# Patient Record
Sex: Male | Born: 1945
Health system: Southern US, Community
[De-identification: ages and names within clinical notes are randomized; demographics above are authoritative.]

## PROBLEM LIST (undated history)

## (undated) ENCOUNTER — Emergency Department (HOSPITAL_BASED_OUTPATIENT_CLINIC_OR_DEPARTMENT_OTHER): Admission: EM | Payer: Medicare Other | Source: Home / Self Care

## (undated) DIAGNOSIS — C61 Malignant neoplasm of prostate: Secondary | ICD-10-CM

## (undated) DIAGNOSIS — Z8679 Personal history of other diseases of the circulatory system: Secondary | ICD-10-CM

## (undated) DIAGNOSIS — I493 Ventricular premature depolarization: Secondary | ICD-10-CM

## (undated) DIAGNOSIS — Z5189 Encounter for other specified aftercare: Secondary | ICD-10-CM

## (undated) DIAGNOSIS — T7840XA Allergy, unspecified, initial encounter: Secondary | ICD-10-CM

## (undated) DIAGNOSIS — F32A Depression, unspecified: Secondary | ICD-10-CM

## (undated) DIAGNOSIS — R945 Abnormal results of liver function studies: Secondary | ICD-10-CM

## (undated) DIAGNOSIS — I4891 Unspecified atrial fibrillation: Secondary | ICD-10-CM

## (undated) DIAGNOSIS — K5792 Diverticulitis of intestine, part unspecified, without perforation or abscess without bleeding: Secondary | ICD-10-CM

## (undated) DIAGNOSIS — Z803 Family history of malignant neoplasm of breast: Secondary | ICD-10-CM

## (undated) DIAGNOSIS — Z800 Family history of malignant neoplasm of digestive organs: Secondary | ICD-10-CM

## (undated) DIAGNOSIS — M549 Dorsalgia, unspecified: Secondary | ICD-10-CM

## (undated) DIAGNOSIS — K219 Gastro-esophageal reflux disease without esophagitis: Secondary | ICD-10-CM

## (undated) DIAGNOSIS — H52209 Unspecified astigmatism, unspecified eye: Secondary | ICD-10-CM

## (undated) DIAGNOSIS — N289 Disorder of kidney and ureter, unspecified: Secondary | ICD-10-CM

## (undated) DIAGNOSIS — K5790 Diverticulosis of intestine, part unspecified, without perforation or abscess without bleeding: Secondary | ICD-10-CM

## (undated) DIAGNOSIS — E559 Vitamin D deficiency, unspecified: Secondary | ICD-10-CM

## (undated) DIAGNOSIS — Z0000 Encounter for general adult medical examination without abnormal findings: Secondary | ICD-10-CM

## (undated) DIAGNOSIS — E785 Hyperlipidemia, unspecified: Secondary | ICD-10-CM

## (undated) DIAGNOSIS — R252 Cramp and spasm: Secondary | ICD-10-CM

## (undated) DIAGNOSIS — Z87442 Personal history of urinary calculi: Secondary | ICD-10-CM

## (undated) DIAGNOSIS — K760 Fatty (change of) liver, not elsewhere classified: Secondary | ICD-10-CM

## (undated) DIAGNOSIS — I4510 Unspecified right bundle-branch block: Secondary | ICD-10-CM

## (undated) DIAGNOSIS — R5381 Other malaise: Secondary | ICD-10-CM

## (undated) DIAGNOSIS — I10 Essential (primary) hypertension: Secondary | ICD-10-CM

## (undated) DIAGNOSIS — R5383 Other fatigue: Principal | ICD-10-CM

## (undated) DIAGNOSIS — H9193 Unspecified hearing loss, bilateral: Secondary | ICD-10-CM

## (undated) DIAGNOSIS — R001 Bradycardia, unspecified: Secondary | ICD-10-CM

## (undated) DIAGNOSIS — F329 Major depressive disorder, single episode, unspecified: Secondary | ICD-10-CM

## (undated) DIAGNOSIS — M545 Low back pain: Secondary | ICD-10-CM

## (undated) DIAGNOSIS — I517 Cardiomegaly: Secondary | ICD-10-CM

## (undated) DIAGNOSIS — K648 Other hemorrhoids: Secondary | ICD-10-CM

## (undated) DIAGNOSIS — K589 Irritable bowel syndrome without diarrhea: Secondary | ICD-10-CM

## (undated) DIAGNOSIS — M419 Scoliosis, unspecified: Secondary | ICD-10-CM

## (undated) DIAGNOSIS — M1990 Unspecified osteoarthritis, unspecified site: Secondary | ICD-10-CM

## (undated) DIAGNOSIS — R739 Hyperglycemia, unspecified: Secondary | ICD-10-CM

## (undated) DIAGNOSIS — M170 Bilateral primary osteoarthritis of knee: Secondary | ICD-10-CM

## (undated) DIAGNOSIS — H269 Unspecified cataract: Secondary | ICD-10-CM

## (undated) DIAGNOSIS — R351 Nocturia: Secondary | ICD-10-CM

## (undated) DIAGNOSIS — N489 Disorder of penis, unspecified: Secondary | ICD-10-CM

## (undated) DIAGNOSIS — D3501 Benign neoplasm of right adrenal gland: Secondary | ICD-10-CM

## (undated) DIAGNOSIS — Z87438 Personal history of other diseases of male genital organs: Secondary | ICD-10-CM

## (undated) DIAGNOSIS — I444 Left anterior fascicular block: Secondary | ICD-10-CM

## (undated) DIAGNOSIS — C4491 Basal cell carcinoma of skin, unspecified: Secondary | ICD-10-CM

## (undated) DIAGNOSIS — F419 Anxiety disorder, unspecified: Secondary | ICD-10-CM

## (undated) DIAGNOSIS — D1720 Benign lipomatous neoplasm of skin and subcutaneous tissue of unspecified limb: Secondary | ICD-10-CM

## (undated) DIAGNOSIS — K769 Liver disease, unspecified: Secondary | ICD-10-CM

## (undated) DIAGNOSIS — E669 Obesity, unspecified: Secondary | ICD-10-CM

## (undated) HISTORY — DX: Gastro-esophageal reflux disease without esophagitis: K21.9

## (undated) HISTORY — DX: Family history of malignant neoplasm of breast: Z80.3

## (undated) HISTORY — PX: CATARACT EXTRACTION, BILATERAL: SHX1313

## (undated) HISTORY — DX: Other hemorrhoids: K64.8

## (undated) HISTORY — DX: Obesity, unspecified: E66.9

## (undated) HISTORY — DX: Irritable bowel syndrome without diarrhea: K58.9

## (undated) HISTORY — DX: Hyperglycemia, unspecified: R73.9

## (undated) HISTORY — DX: Other fatigue: R53.83

## (undated) HISTORY — PX: COLONOSCOPY: SHX174

## (undated) HISTORY — DX: Other malaise: R53.81

## (undated) HISTORY — DX: Family history of malignant neoplasm of digestive organs: Z80.0

## (undated) HISTORY — PX: OTHER SURGICAL HISTORY: SHX169

## (undated) HISTORY — DX: Basal cell carcinoma of skin, unspecified: C44.91

## (undated) HISTORY — DX: Diverticulosis of intestine, part unspecified, without perforation or abscess without bleeding: K57.90

## (undated) HISTORY — DX: Unspecified hearing loss, bilateral: H91.93

## (undated) HISTORY — DX: Bilateral primary osteoarthritis of knee: M17.0

## (undated) HISTORY — DX: Allergy, unspecified, initial encounter: T78.40XA

## (undated) HISTORY — DX: Encounter for other specified aftercare: Z51.89

## (undated) HISTORY — DX: Unspecified cataract: H26.9

## (undated) HISTORY — PX: NOSE SURGERY: SHX723

## (undated) HISTORY — PX: SKIN BIOPSY: SHX1

## (undated) HISTORY — DX: Left anterior fascicular block: I44.4

## (undated) HISTORY — DX: Encounter for general adult medical examination without abnormal findings: Z00.00

## (undated) HISTORY — PX: COLONOSCOPY WITH PROPOFOL: SHX5780

## (undated) HISTORY — DX: Abnormal results of liver function studies: R94.5

## (undated) HISTORY — DX: Low back pain: M54.5

---
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
## 1999-11-22 ENCOUNTER — Emergency Department (HOSPITAL_COMMUNITY): Admission: EM | Admit: 1999-11-22 | Discharge: 1999-11-22 | Payer: Self-pay | Admitting: Emergency Medicine

## 2002-07-11 ENCOUNTER — Encounter: Payer: Self-pay | Admitting: Pulmonary Disease

## 2002-07-11 ENCOUNTER — Ambulatory Visit (HOSPITAL_COMMUNITY): Admission: RE | Admit: 2002-07-11 | Discharge: 2002-07-11 | Payer: Self-pay | Admitting: Pulmonary Disease

## 2002-07-11 DIAGNOSIS — D3501 Benign neoplasm of right adrenal gland: Secondary | ICD-10-CM

## 2002-07-11 HISTORY — DX: Benign neoplasm of right adrenal gland: D35.01

## 2002-08-03 ENCOUNTER — Encounter: Payer: Self-pay | Admitting: Internal Medicine

## 2004-06-26 ENCOUNTER — Ambulatory Visit: Payer: Self-pay | Admitting: Pulmonary Disease

## 2004-10-15 ENCOUNTER — Ambulatory Visit: Payer: Self-pay | Admitting: Pulmonary Disease

## 2004-10-16 ENCOUNTER — Ambulatory Visit (HOSPITAL_COMMUNITY): Admission: RE | Admit: 2004-10-16 | Discharge: 2004-10-16 | Payer: Self-pay | Admitting: Pulmonary Disease

## 2005-02-20 ENCOUNTER — Ambulatory Visit: Payer: Self-pay | Admitting: Pulmonary Disease

## 2005-08-07 ENCOUNTER — Ambulatory Visit: Payer: Self-pay | Admitting: Pulmonary Disease

## 2005-08-11 ENCOUNTER — Ambulatory Visit: Payer: Self-pay | Admitting: Cardiology

## 2006-03-10 ENCOUNTER — Ambulatory Visit: Payer: Self-pay | Admitting: Pulmonary Disease

## 2006-03-10 LAB — CONVERTED CEMR LAB
BUN: 15 mg/dL (ref 6–23)
Bilirubin Urine: NEGATIVE
CO2: 31 meq/L (ref 19–32)
Calcium: 9.7 mg/dL (ref 8.4–10.5)
Chloride: 104 meq/L (ref 96–112)
Creatinine, Ser: 1.3 mg/dL (ref 0.4–1.5)
GFR calc Af Amer: 72 mL/min
GFR calc non Af Amer: 60 mL/min
Glucose, Bld: 105 mg/dL — ABNORMAL HIGH (ref 70–99)
Hemoglobin, Urine: NEGATIVE
Ketones, ur: NEGATIVE mg/dL
Leukocytes, UA: NEGATIVE
Nitrite: NEGATIVE
PSA: 1.01 ng/mL (ref 0.10–4.00)
Potassium: 4 meq/L (ref 3.5–5.1)
Sodium: 141 meq/L (ref 135–145)
Specific Gravity, Urine: 1.015 (ref 1.000–1.03)
Total Protein, Urine: NEGATIVE mg/dL
Urine Glucose: NEGATIVE mg/dL
Urobilinogen, UA: 0.2 (ref 0.0–1.0)
pH: 7 (ref 5.0–8.0)

## 2006-03-29 ENCOUNTER — Ambulatory Visit: Payer: Self-pay | Admitting: Pulmonary Disease

## 2006-03-29 LAB — CONVERTED CEMR LAB
ALT: 36 units/L (ref 0–40)
AST: 29 units/L (ref 0–37)
Albumin: 4.2 g/dL (ref 3.5–5.2)
Alkaline Phosphatase: 64 units/L (ref 39–117)
BUN: 12 mg/dL (ref 6–23)
Basophils Absolute: 0 10*3/uL (ref 0.0–0.1)
Basophils Relative: 0.8 % (ref 0.0–1.0)
Bilirubin, Direct: 0.1 mg/dL (ref 0.0–0.3)
CO2: 31 meq/L (ref 19–32)
Calcium: 10.4 mg/dL (ref 8.4–10.5)
Chloride: 105 meq/L (ref 96–112)
Cholesterol: 224 mg/dL (ref 0–200)
Creatinine, Ser: 1.4 mg/dL (ref 0.4–1.5)
Direct LDL: 168.5 mg/dL
Eosinophils Absolute: 0.2 10*3/uL (ref 0.0–0.6)
Eosinophils Relative: 3.6 % (ref 0.0–5.0)
GFR calc Af Amer: 66 mL/min
GFR calc non Af Amer: 55 mL/min
Glucose, Bld: 102 mg/dL — ABNORMAL HIGH (ref 70–99)
HCT: 44.8 % (ref 39.0–52.0)
HDL: 35.3 mg/dL — ABNORMAL LOW (ref >39.0)
Hemoglobin: 15.4 g/dL (ref 13.0–17.0)
Lymphocytes Relative: 25.5 % (ref 12.0–46.0)
MCHC: 34.3 g/dL (ref 30.0–36.0)
MCV: 94.2 fL (ref 78.0–100.0)
Monocytes Absolute: 0.5 10*3/uL (ref 0.2–0.7)
Monocytes Relative: 8.5 % (ref 3.0–11.0)
Neutro Abs: 3.3 10*3/uL (ref 1.4–7.7)
Neutrophils Relative %: 61.6 % (ref 43.0–77.0)
Platelets: 206 10*3/uL (ref 150–400)
Potassium: 4.4 meq/L (ref 3.5–5.1)
RBC: 4.75 M/uL (ref 4.22–5.81)
RDW: 12.9 % (ref 11.5–14.6)
Sodium: 143 meq/L (ref 135–145)
TSH: 2.84 microintl units/mL (ref 0.35–5.50)
Total Bilirubin: 0.7 mg/dL (ref 0.3–1.2)
Total CHOL/HDL Ratio: 6.3
Total Protein: 6.8 g/dL (ref 6.0–8.3)
Triglycerides: 109 mg/dL (ref 0–149)
VLDL: 22 mg/dL (ref 0–40)
WBC: 5.4 10*3/uL (ref 4.5–10.5)

## 2006-08-11 ENCOUNTER — Ambulatory Visit: Payer: Self-pay | Admitting: Pulmonary Disease

## 2006-08-11 LAB — CONVERTED CEMR LAB
ALT: 31 units/L (ref 0–53)
AST: 25 units/L (ref 0–37)
Albumin: 4.1 g/dL (ref 3.5–5.2)
Alkaline Phosphatase: 62 units/L (ref 39–117)
BUN: 10 mg/dL (ref 6–23)
Basophils Absolute: 0 10*3/uL (ref 0.0–0.1)
Basophils Relative: 0.3 % (ref 0.0–1.0)
Bilirubin, Direct: 0.2 mg/dL (ref 0.0–0.3)
CO2: 30 meq/L (ref 19–32)
Calcium: 9.7 mg/dL (ref 8.4–10.5)
Chloride: 104 meq/L (ref 96–112)
Creatinine, Ser: 1.4 mg/dL (ref 0.4–1.5)
Eosinophils Absolute: 0.2 10*3/uL (ref 0.0–0.6)
Eosinophils Relative: 1.8 % (ref 0.0–5.0)
GFR calc Af Amer: 66 mL/min
GFR calc non Af Amer: 55 mL/min
Glucose, Bld: 96 mg/dL (ref 70–99)
HCT: 42.2 % (ref 39.0–52.0)
Hemoglobin: 14.7 g/dL (ref 13.0–17.0)
Lymphocytes Relative: 14.2 % (ref 12.0–46.0)
MCHC: 34.9 g/dL (ref 30.0–36.0)
MCV: 93.4 fL (ref 78.0–100.0)
Monocytes Absolute: 0.9 10*3/uL — ABNORMAL HIGH (ref 0.2–0.7)
Monocytes Relative: 8.3 % (ref 3.0–11.0)
Neutro Abs: 8.3 10*3/uL — ABNORMAL HIGH (ref 1.4–7.7)
Neutrophils Relative %: 75.4 % (ref 43.0–77.0)
Platelets: 191 10*3/uL (ref 150–400)
Potassium: 4.3 meq/L (ref 3.5–5.1)
RBC: 4.52 M/uL (ref 4.22–5.81)
RDW: 13.5 % (ref 11.5–14.6)
Sed Rate: 8 mm/hr (ref 0–20)
Sodium: 141 meq/L (ref 135–145)
Total Bilirubin: 1.4 mg/dL — ABNORMAL HIGH (ref 0.3–1.2)
Total Protein: 7 g/dL (ref 6.0–8.3)
WBC: 11 10*3/uL — ABNORMAL HIGH (ref 4.5–10.5)

## 2006-08-12 ENCOUNTER — Ambulatory Visit: Payer: Self-pay | Admitting: Cardiology

## 2007-01-13 DIAGNOSIS — K5792 Diverticulitis of intestine, part unspecified, without perforation or abscess without bleeding: Secondary | ICD-10-CM

## 2007-01-13 HISTORY — DX: Diverticulitis of intestine, part unspecified, without perforation or abscess without bleeding: K57.92

## 2007-01-13 HISTORY — PX: COLON SURGERY: SHX602

## 2007-04-12 ENCOUNTER — Ambulatory Visit: Payer: Self-pay | Admitting: Pulmonary Disease

## 2007-04-12 DIAGNOSIS — F411 Generalized anxiety disorder: Secondary | ICD-10-CM | POA: Insufficient documentation

## 2007-04-12 DIAGNOSIS — I10 Essential (primary) hypertension: Secondary | ICD-10-CM | POA: Insufficient documentation

## 2007-04-12 DIAGNOSIS — K219 Gastro-esophageal reflux disease without esophagitis: Secondary | ICD-10-CM | POA: Insufficient documentation

## 2007-07-22 ENCOUNTER — Encounter (INDEPENDENT_AMBULATORY_CARE_PROVIDER_SITE_OTHER): Payer: Self-pay | Admitting: *Deleted

## 2007-07-22 ENCOUNTER — Ambulatory Visit: Payer: Self-pay | Admitting: Pulmonary Disease

## 2007-07-22 LAB — CONVERTED CEMR LAB
ALT: 44 units/L (ref 0–53)
AST: 26 units/L (ref 0–37)
Albumin: 4.3 g/dL (ref 3.5–5.2)
Alkaline Phosphatase: 70 units/L (ref 39–117)
BUN: 12 mg/dL (ref 6–23)
Basophils Absolute: 0 10*3/uL (ref 0.0–0.1)
Basophils Relative: 0.1 % (ref 0.0–1.0)
Bilirubin, Direct: 0.2 mg/dL (ref 0.0–0.3)
CO2: 29 meq/L (ref 19–32)
Calcium: 9.9 mg/dL (ref 8.4–10.5)
Chloride: 102 meq/L (ref 96–112)
Creatinine, Ser: 1.4 mg/dL (ref 0.4–1.5)
Eosinophils Absolute: 0.1 10*3/uL (ref 0.0–0.7)
Eosinophils Relative: 1.3 % (ref 0.0–5.0)
GFR calc Af Amer: 66 mL/min
GFR calc non Af Amer: 55 mL/min
Glucose, Bld: 104 mg/dL — ABNORMAL HIGH (ref 70–99)
HCT: 46 % (ref 39.0–52.0)
Hemoglobin: 16 g/dL (ref 13.0–17.0)
Lymphocytes Relative: 13.7 % (ref 12.0–46.0)
MCHC: 34.7 g/dL (ref 30.0–36.0)
MCV: 94.5 fL (ref 78.0–100.0)
Monocytes Absolute: 0.7 10*3/uL (ref 0.1–1.0)
Monocytes Relative: 6.5 % (ref 3.0–12.0)
Neutro Abs: 8.2 10*3/uL — ABNORMAL HIGH (ref 1.4–7.7)
Neutrophils Relative %: 78.4 % — ABNORMAL HIGH (ref 43.0–77.0)
Platelets: 196 10*3/uL (ref 150–400)
Potassium: 4.4 meq/L (ref 3.5–5.1)
RBC: 4.87 M/uL (ref 4.22–5.81)
RDW: 13.4 % (ref 11.5–14.6)
Sed Rate: 4 mm/hr (ref 0–16)
Sodium: 139 meq/L (ref 135–145)
Total Bilirubin: 1.5 mg/dL — ABNORMAL HIGH (ref 0.3–1.2)
Total Protein: 7.4 g/dL (ref 6.0–8.3)
WBC: 10.4 10*3/uL (ref 4.5–10.5)

## 2007-07-25 ENCOUNTER — Telehealth: Payer: Self-pay | Admitting: Adult Health

## 2007-07-29 ENCOUNTER — Ambulatory Visit: Payer: Self-pay | Admitting: Internal Medicine

## 2007-07-29 LAB — CONVERTED CEMR LAB: Streptococcus, Group A Screen (Direct): NEGATIVE

## 2007-07-31 ENCOUNTER — Telehealth: Payer: Self-pay | Admitting: Pulmonary Disease

## 2007-08-01 ENCOUNTER — Encounter: Payer: Self-pay | Admitting: Pulmonary Disease

## 2007-08-01 ENCOUNTER — Telehealth: Payer: Self-pay | Admitting: Internal Medicine

## 2007-08-01 ENCOUNTER — Ambulatory Visit: Payer: Self-pay | Admitting: Internal Medicine

## 2007-08-01 ENCOUNTER — Telehealth: Payer: Self-pay | Admitting: Adult Health

## 2007-08-01 ENCOUNTER — Ambulatory Visit: Payer: Self-pay | Admitting: Cardiovascular Disease

## 2007-08-01 LAB — CONVERTED CEMR LAB
Bacteria, UA: NEGATIVE
Bilirubin Urine: NEGATIVE
Crystals: NEGATIVE
Ketones, ur: NEGATIVE mg/dL
Leukocytes, UA: NEGATIVE
Nitrite: NEGATIVE
Specific Gravity, Urine: 1.025 (ref 1.000–1.03)
Total Protein, Urine: NEGATIVE mg/dL
Urine Glucose: NEGATIVE mg/dL
Urobilinogen, UA: 0.2 (ref 0.0–1.0)
pH: 5.5 (ref 5.0–8.0)

## 2007-08-03 ENCOUNTER — Ambulatory Visit: Payer: Self-pay | Admitting: Gastroenterology

## 2007-08-12 ENCOUNTER — Ambulatory Visit: Payer: Self-pay | Admitting: Internal Medicine

## 2007-08-15 ENCOUNTER — Telehealth: Payer: Self-pay | Admitting: Adult Health

## 2007-08-19 ENCOUNTER — Encounter: Payer: Self-pay | Admitting: Pulmonary Disease

## 2007-09-09 ENCOUNTER — Ambulatory Visit: Payer: Self-pay | Admitting: Internal Medicine

## 2007-09-21 ENCOUNTER — Ambulatory Visit: Payer: Self-pay | Admitting: Pulmonary Disease

## 2007-09-22 ENCOUNTER — Encounter: Payer: Self-pay | Admitting: Pulmonary Disease

## 2007-09-22 ENCOUNTER — Encounter: Payer: Self-pay | Admitting: Internal Medicine

## 2007-09-22 DIAGNOSIS — K760 Fatty (change of) liver, not elsewhere classified: Secondary | ICD-10-CM | POA: Insufficient documentation

## 2007-09-22 DIAGNOSIS — M1990 Unspecified osteoarthritis, unspecified site: Secondary | ICD-10-CM | POA: Insufficient documentation

## 2007-09-22 DIAGNOSIS — M47817 Spondylosis without myelopathy or radiculopathy, lumbosacral region: Secondary | ICD-10-CM | POA: Insufficient documentation

## 2007-09-22 DIAGNOSIS — D3500 Benign neoplasm of unspecified adrenal gland: Secondary | ICD-10-CM | POA: Insufficient documentation

## 2007-11-04 ENCOUNTER — Inpatient Hospital Stay (HOSPITAL_COMMUNITY): Admission: RE | Admit: 2007-11-04 | Discharge: 2007-11-08 | Payer: Self-pay | Admitting: Surgery

## 2007-11-04 ENCOUNTER — Encounter (INDEPENDENT_AMBULATORY_CARE_PROVIDER_SITE_OTHER): Payer: Self-pay | Admitting: Surgery

## 2007-11-08 ENCOUNTER — Encounter: Payer: Self-pay | Admitting: Internal Medicine

## 2007-11-18 ENCOUNTER — Telehealth (INDEPENDENT_AMBULATORY_CARE_PROVIDER_SITE_OTHER): Payer: Self-pay | Admitting: *Deleted

## 2007-11-24 ENCOUNTER — Telehealth (INDEPENDENT_AMBULATORY_CARE_PROVIDER_SITE_OTHER): Payer: Self-pay | Admitting: *Deleted

## 2007-11-25 ENCOUNTER — Telehealth (INDEPENDENT_AMBULATORY_CARE_PROVIDER_SITE_OTHER): Payer: Self-pay | Admitting: *Deleted

## 2007-11-25 ENCOUNTER — Ambulatory Visit: Payer: Self-pay | Admitting: Pulmonary Disease

## 2007-11-25 DIAGNOSIS — E669 Obesity, unspecified: Secondary | ICD-10-CM | POA: Insufficient documentation

## 2007-11-25 HISTORY — DX: Obesity, unspecified: E66.9

## 2007-11-27 LAB — CONVERTED CEMR LAB
Bilirubin Urine: NEGATIVE
Crystals: NEGATIVE
Hemoglobin, Urine: NEGATIVE
Ketones, ur: NEGATIVE mg/dL
Leukocytes, UA: NEGATIVE
Nitrite: NEGATIVE
Specific Gravity, Urine: 1.025 (ref 1.000–1.03)
Total Protein, Urine: NEGATIVE mg/dL
Urine Glucose: NEGATIVE mg/dL
Urobilinogen, UA: 0.2 (ref 0.0–1.0)
pH: 6 (ref 5.0–8.0)

## 2008-03-26 ENCOUNTER — Ambulatory Visit: Payer: Self-pay | Admitting: Pulmonary Disease

## 2008-03-31 LAB — CONVERTED CEMR LAB
ALT: 33 units/L (ref 0–53)
AST: 26 units/L (ref 0–37)
Albumin: 4 g/dL (ref 3.5–5.2)
Alkaline Phosphatase: 65 units/L (ref 39–117)
BUN: 19 mg/dL (ref 6–23)
Basophils Absolute: 0.1 10*3/uL (ref 0.0–0.1)
Basophils Relative: 0.8 % (ref 0.0–3.0)
Bilirubin, Direct: 0.1 mg/dL (ref 0.0–0.3)
CO2: 29 meq/L (ref 19–32)
Calcium: 9.4 mg/dL (ref 8.4–10.5)
Chloride: 110 meq/L (ref 96–112)
Cholesterol: 176 mg/dL (ref 0–200)
Creatinine, Ser: 1.4 mg/dL (ref 0.4–1.5)
Eosinophils Absolute: 0.3 10*3/uL (ref 0.0–0.7)
Eosinophils Relative: 3.8 % (ref 0.0–5.0)
GFR calc Af Amer: 66 mL/min
GFR calc non Af Amer: 55 mL/min
Glucose, Bld: 104 mg/dL — ABNORMAL HIGH (ref 70–99)
HCT: 44 % (ref 39.0–52.0)
HDL: 35.6 mg/dL — ABNORMAL LOW (ref >39.0)
Hemoglobin: 15.1 g/dL (ref 13.0–17.0)
LDL Cholesterol: 123 mg/dL — ABNORMAL HIGH (ref 0–99)
Lymphocytes Relative: 27.2 % (ref 12.0–46.0)
MCHC: 34.3 g/dL (ref 30.0–36.0)
MCV: 94.9 fL (ref 78.0–100.0)
Monocytes Absolute: 0.5 10*3/uL (ref 0.1–1.0)
Monocytes Relative: 7.8 % (ref 3.0–12.0)
Neutro Abs: 4 10*3/uL (ref 1.4–7.7)
Neutrophils Relative %: 60.4 % (ref 43.0–77.0)
PSA: 1.24 ng/mL (ref 0.10–4.00)
Platelets: 205 10*3/uL (ref 150–400)
Potassium: 4.2 meq/L (ref 3.5–5.1)
RBC: 4.64 M/uL (ref 4.22–5.81)
RDW: 13.5 % (ref 11.5–14.6)
Sodium: 146 meq/L — ABNORMAL HIGH (ref 135–145)
TSH: 3.15 microintl units/mL (ref 0.35–5.50)
Total Bilirubin: 0.9 mg/dL (ref 0.3–1.2)
Total CHOL/HDL Ratio: 4.9
Total Protein: 6.8 g/dL (ref 6.0–8.3)
Triglycerides: 87 mg/dL (ref 0–149)
VLDL: 17 mg/dL (ref 0–40)
WBC: 6.7 10*3/uL (ref 4.5–10.5)

## 2009-02-01 ENCOUNTER — Telehealth: Payer: Self-pay | Admitting: Pulmonary Disease

## 2009-02-04 ENCOUNTER — Ambulatory Visit: Payer: Self-pay | Admitting: Pulmonary Disease

## 2009-02-06 ENCOUNTER — Ambulatory Visit: Payer: Self-pay | Admitting: Pulmonary Disease

## 2009-02-07 LAB — CONVERTED CEMR LAB
ALT: 30 units/L (ref 0–53)
AST: 27 units/L (ref 0–37)
Albumin: 4 g/dL (ref 3.5–5.2)
Alkaline Phosphatase: 61 units/L (ref 39–117)
BUN: 13 mg/dL (ref 6–23)
Basophils Absolute: 0 10*3/uL (ref 0.0–0.1)
Basophils Relative: 0.7 % (ref 0.0–3.0)
Bilirubin Urine: NEGATIVE
Bilirubin, Direct: 0.2 mg/dL (ref 0.0–0.3)
CO2: 28 meq/L (ref 19–32)
Calcium: 9.2 mg/dL (ref 8.4–10.5)
Chloride: 107 meq/L (ref 96–112)
Cholesterol: 139 mg/dL (ref 0–200)
Creatinine, Ser: 1.5 mg/dL (ref 0.4–1.5)
Eosinophils Absolute: 0.3 10*3/uL (ref 0.0–0.7)
Eosinophils Relative: 4.3 % (ref 0.0–5.0)
GFR calc non Af Amer: 50.12 mL/min (ref >60)
Glucose, Bld: 95 mg/dL (ref 70–99)
HCT: 44.1 % (ref 39.0–52.0)
HDL: 36.4 mg/dL — ABNORMAL LOW (ref >39.00)
Hemoglobin, Urine: NEGATIVE
Hemoglobin: 14.7 g/dL (ref 13.0–17.0)
Ketones, ur: NEGATIVE mg/dL
LDL Cholesterol: 80 mg/dL (ref 0–99)
Leukocytes, UA: NEGATIVE
Lymphocytes Relative: 28.6 % (ref 12.0–46.0)
Lymphs Abs: 1.8 10*3/uL (ref 0.7–4.0)
MCHC: 33.3 g/dL (ref 30.0–36.0)
MCV: 96 fL (ref 78.0–100.0)
Monocytes Absolute: 0.6 10*3/uL (ref 0.1–1.0)
Monocytes Relative: 8.9 % (ref 3.0–12.0)
Neutro Abs: 3.6 10*3/uL (ref 1.4–7.7)
Neutrophils Relative %: 57.5 % (ref 43.0–77.0)
Nitrite: NEGATIVE
PSA: 1.42 ng/mL (ref 0.10–4.00)
Platelets: 189 10*3/uL (ref 150.0–400.0)
Potassium: 3.9 meq/L (ref 3.5–5.1)
RBC: 4.59 M/uL (ref 4.22–5.81)
RDW: 13.2 % (ref 11.5–14.6)
Sodium: 143 meq/L (ref 135–145)
Specific Gravity, Urine: >=1.03 (ref 1.000–1.030)
TSH: 5.33 microintl units/mL (ref 0.35–5.50)
Total Bilirubin: 1 mg/dL (ref 0.3–1.2)
Total CHOL/HDL Ratio: 4
Total Protein, Urine: NEGATIVE mg/dL
Total Protein: 6.6 g/dL (ref 6.0–8.3)
Triglycerides: 114 mg/dL (ref 0.0–149.0)
Urine Glucose: NEGATIVE mg/dL
Urobilinogen, UA: 0.2 (ref 0.0–1.0)
VLDL: 22.8 mg/dL (ref 0.0–40.0)
WBC: 6.3 10*3/uL (ref 4.5–10.5)
pH: 5 (ref 5.0–8.0)

## 2009-03-08 ENCOUNTER — Telehealth (INDEPENDENT_AMBULATORY_CARE_PROVIDER_SITE_OTHER): Payer: Self-pay | Admitting: *Deleted

## 2009-05-24 ENCOUNTER — Ambulatory Visit: Payer: Self-pay | Admitting: Cardiology

## 2009-05-24 LAB — CONVERTED CEMR LAB
BUN: 14 mg/dL (ref 6–23)
CO2: 30 meq/L (ref 19–32)
Calcium: 10 mg/dL (ref 8.4–10.5)
Chloride: 105 meq/L (ref 96–112)
Creatinine, Ser: 1.3 mg/dL (ref 0.4–1.5)
GFR calc non Af Amer: 57.53 mL/min (ref >60)
Glucose, Bld: 90 mg/dL (ref 70–99)
Potassium: 4.5 meq/L (ref 3.5–5.1)
Sodium: 141 meq/L (ref 135–145)

## 2009-05-30 ENCOUNTER — Telehealth (INDEPENDENT_AMBULATORY_CARE_PROVIDER_SITE_OTHER): Payer: Self-pay | Admitting: *Deleted

## 2009-06-03 ENCOUNTER — Ambulatory Visit: Payer: Self-pay | Admitting: Cardiology

## 2009-06-03 ENCOUNTER — Ambulatory Visit: Payer: Self-pay

## 2009-06-03 ENCOUNTER — Encounter (HOSPITAL_COMMUNITY): Admission: RE | Admit: 2009-06-03 | Discharge: 2009-08-14 | Payer: Self-pay | Admitting: Cardiology

## 2009-06-03 HISTORY — PX: OTHER SURGICAL HISTORY: SHX169

## 2009-07-29 ENCOUNTER — Telehealth (INDEPENDENT_AMBULATORY_CARE_PROVIDER_SITE_OTHER): Payer: Self-pay | Admitting: *Deleted

## 2010-02-06 ENCOUNTER — Ambulatory Visit: Admit: 2010-02-06 | Payer: Self-pay | Admitting: Pulmonary Disease

## 2010-02-11 NOTE — Assessment & Plan Note (Signed)
Summary: 6 months/apc   Primary Care Provider:  Alroy Dust, MD  CC:  65month ROV & review of mult medical problems....  History of Present Illness: 65 y/o WM here for a follow up visit... he has multiple medical problems as noted below...    ~  he  had several bouts of acute diverticulitis in 2009 and seen by DrGessner w/ colonoscopy showing severe sigmoid divertics w/ redundant colon- referred to DrTsuei for consideration of sigmoid colectomy... this was done Oct09 (lap assisted sigmoid colectomy) and fully recovered now...  ~  treated in Nov09 for ?NGU w/ Doxy Bid... UA & culture were neg...  ~  Mar10:  feeling better overall now and recovered from his abd surg...   ~  February 06, 2009:  here for check-up & review of recent fasting labs... c/o some LBP w/ radiation to the right leg... known lumbar spondylitic disease but never saw Ortho in past>> we discussed Ortho referral for further eval (XRays, MRI, etc) & Rx w/ Parafon Forte, Mobic, Heat, etc...  weight is up to 264# and we discussed diet + exercise & back exercises as well...    Current Problem List:  HYPERTENSION (ICD-401.9) - on ASA 325mg /d, & ATENOLOL 50mg /d (he prefers 1/2 Bid) w/ good control of BP... BP= 122/82, takes med regularly and tolerates well... he denies HA, fatigue, visual changes, CP, palipit, dizziness, syncope, dyspnea, edema, etc...  ~  CXR 10/09 pre-op was clear & WNL...  HYPERLIPIDEMIA (ICD-272.4) - on SIMVASTATIN 80mg /d (he prefers 1/2 Bid)..  ~  FLP 3/08 on diet showed TChol 224, TG 109, HDL 35, LDL 169... rec to start Crestor 20mg /d...  ~  FLP 3/10 on Cres20 showed TChol 176, TG 87, HDL 36, LDL 123... changed to Simva80- $$.  ~  FLP 1/11 on Simva80 showed TChol 139, TG 114, HDL 36, LDL 80  GERD (ICD-530.81) - uses OTC Prilosec or Pepcid as needed... no prev EGD or imaging studies done...  DIVERTICULOSIS OF COLON (ICD-562.10) - ** see above ** hx severe diverticulosis w/ several  bouts of diverticulitis  req antibiotic Rx... subseq lap-assited sigmoid colectomy 10/09 by DrTsuei... he uses STOOL SOFTENERS Prn & ANUSOL HC Prn...  ~  CT Abd 7/09 showed active diverticulitis in LLQ- no free air, perf, or abscess... mild basilar atx, 2.6cm right adrenal adenoma (unchanged), AA atherosclerotic changes, sm renal cyst...  ~  colonoscopy 09/09/07 by Rodena Medin w/ severe sigmoid divertics & redundant colon... no polyps.  ~  Oct09: DrTsuei performed a lap-assisted sigmoid colectomy... & improved after that.  FAMILY HX COLON CANCER (ICD-V16.0) - parent dx w/ colon cancer age 65...  Hx of FATTY LIVER DISEASE (ICD-571.8) - Abd Sonar 10/06 showed norm GB, diffuse incr echogenicity of liver c/w fatty infiltration...  ~  normal LFTs 1/11 w/ SGOT= 27, SGPT= 30...  BENIGN NEOPLASM OF ADRENAL GLAND (ICD-227.0) - 2.6cm right adrenal adenoma without change seen on CT scans back to 2004 & no change on the scan in 2009...  DEGENERATIVE JOINT DISEASE (ICD-715.90) & SPONDYLOSIS, LUMBAR (ICD-721.3) - CT Abd showed lumar spondylosis as an incidental finding- bilat L5 pars defects w/ gr 1 anterolithesis of L5 on S1... prev denied back pain- stoic & refused Ortho eval...  ~  1/11: presents w/ LBP w/ radiation to right leg- advised Ortho eval for XRays & poss MRI... he will decide; Rx MOBIC 7.5mg /d & PARAFON FORTE Tid Prn...  ANXIETY (ICD-300.00) - he uses LORAZEPAM 1mg  Prn...    Allergies: 1)  !  Ceclor  Comments:  Nurse/Medical Assistant: The patient's medications and allergies were reviewed with the patient and were updated in the Medication and Allergy Lists.  Past History:  Past Medical History:  HYPERTENSION (ICD-401.9) HYPERLIPIDEMIA (ICD-272.4) OVERWEIGHT (ICD-278.02) GERD (ICD-530.81) DIVERTICULOSIS OF COLON (ICD-562.10) FAMILY HX COLON CANCER (ICD-V16.0) Hx of FATTY LIVER DISEASE (ICD-571.8) BENIGN NEOPLASM OF ADRENAL GLAND (ICD-227.0) DEGENERATIVE JOINT DISEASE (ICD-715.90) SPONDYLOSIS, LUMBAR  (ICD-721.3) ANXIETY (ICD-300.00)  Past Surgical History: Nasal surgery S/P laparoscopic assisted sigmoid colectomy 10/09 by DrTsuei  Family History: Reviewed history from 09/21/2007 and no changes required. Mother had breast cancer & colon cancer Family History of Heart Disease: sister & brothers Diverticulitis, siblings, one has had successful segmental resection  Social History: Reviewed history from 09/21/2007 and no changes required. Married Patient is a former smoker. quit in 1971 Alcohol Use - 2 drinks daily  Review of Systems      See HPI       The patient complains of difficulty walking.  The patient denies anorexia, fever, weight loss, weight gain, vision loss, decreased hearing, hoarseness, chest pain, syncope, dyspnea on exertion, peripheral edema, prolonged cough, headaches, hemoptysis, abdominal pain, melena, hematochezia, severe indigestion/heartburn, hematuria, incontinence, muscle weakness, suspicious skin lesions, transient blindness, depression, unusual weight change, abnormal bleeding, enlarged lymph nodes, and angioedema.         His CC is LBP & some puritis ani...   Vital Signs:  Patient profile:   65 year old male Height:      73 inches Weight:      263.50 pounds BMI:     34.89 O2 Sat:      95 % on Room air Temp:     97.2 degrees F oral Pulse rate:   75 / minute BP sitting:   122 / 82  (left arm) Cuff size:   regular  Vitals Entered By: [name] CMA (February 06, 2009 11:43 AM)  O2 Sat at Rest %:  95 O2 Flow:  Room air CC: 63month ROV & review of mult medical problems... Comments meds updated today   Physical Exam  Additional Exam:  WD, Overweight, 65 y/o WM in NAD... GENERAL:  Alert & oriented; pleasant & cooperative... HEENT:  [place]/AT, EOM-wnl, PERRLA, EACs-clear, TMs-wnl, NOSE-clear, THROAT-clear & wnl. NECK:  Supple w/ fairROM; no JVD; normal carotid impulses w/o bruits; no thyromegaly or nodules palpated; no lymphadenopathy. CHEST:   Clear to P & A; without wheezes/ rales/ or rhonchi heard... HEART:  Regular Rhythm; without murmurs/ rubs/ or gallops detected... ABDOMEN:  Soft & nontender; normal bowel sounds; no organomegaly or masses palpated... EXT: without deformities, mild arthritic changes; no varicose veins/ +venous insuffic/ no edema. BACK:  SLR is equivocal at 60degrees... DTRs symmetric... NEURO:  CN's intact; no focal neuro deficits... DERM:  No lesions noted; no rash etc...     MISC. Report  Procedure date:  02/04/2009  Findings:      Lipid Panel (LIPID)   Cholesterol               139 mg/dL                   1-308   Triglycerides             114.0 mg/dL                 6.5-784.6   HDL                  [L]  96.29 mg/dL                 >  39.00    LDL Cholesterol           80 mg/dL                    1-61  BMP (METABOL)   Sodium                    143 mEq/L                   135-145   Potassium                 3.9 mEq/L                   3.5-5.1   Chloride                  107 mEq/L                   96-112   Carbon Dioxide            28 mEq/L                    19-32   Glucose                   95 mg/dL                    09-60   BUN                       13 mg/dL                    4-54   Creatinine                1.5 mg/dL                   0.9-8.1   Calcium                   9.2 mg/dL                   1.9-14.7   GFR                       50.12 mL/min                >60  CBC Platelet w/Diff (CBCD)   White Cell Count          6.3 K/uL                    4.5-10.5   Red Cell Count            4.59 Mil/uL                 4.22-5.81   Hemoglobin                14.7 g/dL                   82.9-56.2   Hematocrit                44.1 %                      39.0-52.0   MCV                       96.0 fl  78.0-100.0   Platelet Count            189.0 K/uL                  150.0-400.0   Neutrophil %              57.5 %                      43.0-77.0   Lymphocyte %              28.6 %                       12.0-46.0   Monocyte %                8.9 %                       3.0-12.0   Eosinophils%              4.3 %                       0.0-5.0   Basophils %               0.7 %      Comments:      Hepatic/Liver Function Panel (HEPATIC)   Total Bilirubin           1.0 mg/dL                   9.5-6.2   Direct Bilirubin          0.2 mg/dL                   1.3-0.8   Alkaline Phosphatase      61 U/L                      39-117   AST                       27 U/L                      0-37   ALT                       30 U/L                      0-53   Total Protein             6.6 g/dL                    6.5-7.8   Albumin                   4.0 g/dL                    4.6-9.6   TSH (TSH)   FastTSH                   5.33 uIU/mL                 0.35-5.50  UDip Only (UDIP)   Color                     YELLOW   Clarity  CLEAR                       Clear   Specific Gravity          >=1.030                     1.000 - 1.030   Urine Ph                  5.0                         5.0-8.0   Protein                   NEGATIVE                    Negative   Urine Glucose             NEGATIVE                    Negative   Ketones                   NEGATIVE                    Negative   Urine Bilirubin           NEGATIVE                    Negative   Blood                     NEGATIVE                    Negative   Urobilinogen              0.2                         0.0 - 1.0   Leukocyte Esterace        NEGATIVE                    Negative   Nitrite                   NEGATIVE                    Negative   Prostate Specific Antigen (PSA)   PSA-Hyb                   1.42 ng/mL                  0.10-4.00   Impression & Recommendations:  Problem # 1:  HYPERTENSION (ICD-401.9) Controlled-  same med. His updated medication list for this problem includes:    Atenolol 50 Mg Tabs (Atenolol) .Marland Kitchen... Take 1 tablet by mouth once a day  Problem # 2:  HYPERLIPIDEMIA  (ICD-272.4) Doing satis on the Simva80 + diet Rx... His updated medication list for this problem includes:    Simvastatin 80 Mg Tabs (Simvastatin) .Marland Kitchen... Take 1 tab by mouth at bedtime...  Problem # 3:  GERD (ICD-530.81) Continue OMEP... His updated medication list for this problem includes:    Omeprazole 20 Mg Cpdr (Omeprazole) .Marland Kitchen... Take one tablet by mouth 30 mins prior to the first meal of the day  Problem # 4:  DIVERTICULOSIS OF COLON (ICD-562.10) Stable-  no exac, up to date on colon, etc... advised Metamucil etc...  Problem # 5:  DEGENERATIVE JOINT DISEASE (ICD-715.90) He has DJD, LBP, known Spondylitic dis... we discussed Rx w/ rest, heat, Parafon Forte + Mobic... needs Ortho eval w/ XRays, MRI, etc & we will refer when he agrees to consultation... His updated medication list for this problem includes:    Bayer Aspirin 325 Mg Tabs (Aspirin) .Marland Kitchen... Take 1 tablet by mouth once a day    Meloxicam 7.5 Mg Tabs (Meloxicam) .Marland Kitchen... Take 1 tab by mouth once daily as needed for arthritis pain (take w/ a meal)  Problem # 6:  OTHER MEDICAL PROBLEMS AS NOTED>>>  Complete Medication List: 1)  Bayer Aspirin 325 Mg Tabs (Aspirin) .... Take 1 tablet by mouth once a day 2)  Atenolol 50 Mg Tabs (Atenolol) .... Take 1 tablet by mouth once a day 3)  Simvastatin 80 Mg Tabs (Simvastatin) .... Take 1 tab by mouth at bedtime.Marland KitchenMarland Kitchen 4)  Omeprazole 20 Mg Cpdr (Omeprazole) .... Take one tablet by mouth 30 mins prior to the first meal of the day 5)  Dulcolax Stool Softener 100 Mg Caps (Docusate sodium) .... As needed 6)  Lorazepam 1 Mg Tabs (Lorazepam) .... Take 1 tablet by mouth three times a day as needed for nerves.Marland KitchenMarland Kitchen 7)  Chlorzoxazone 500 Mg Tabs (Chlorzoxazone) .... Take 1 tab by mouth three times a day as needed for muscle spasm... 8)  Meloxicam 7.5 Mg Tabs (Meloxicam) .... Take 1 tab by mouth once daily as needed for arthritis pain (take w/ a meal) 9)  Anusol-hc 2.5 % Crea (Hydrocortisone) .... Apply as  directed...  Other Orders: Prescription Created Electronically [id number])  Patient Instructions: 1)  Today we updated your med list- see below.... 2)  We wrote new perscriptions for generic Parafon to take up to 3 times daily for muscle spasm,  and generic Mobic to try once daily w/ food for arthritis pain.Marland KitchenMarland Kitchen 3)  If your back/ leg pain gets worse- call me for an Ortho referral... 4)  Merlyn Albert, You need to get on a diet + exercise program and get your weight down... 5)  Call for any questions... Prescriptions: OMEPRAZOLE 20 MG CPDR (OMEPRAZOLE) take one tablet by mouth 30 mins prior to the first meal of the day  #90 x 6   Entered by:   [name] CMA   Authorized by:   Michele Mcalpine MD   Signed by:   [name] CMA on 02/06/2009   Method used:   Print then Give to Patient   RxID:   [id number] LORAZEPAM 1 MG  TABS (LORAZEPAM) Take 1 tablet by mouth three times a day as needed for nerves...  #90 x 4   Entered by:   [name] CMA   Authorized by:   Michele Mcalpine MD   Signed by:   [name] CMA on 02/06/2009   Method used:   Print then Give to Patient   RxID:   [id number] ATENOLOL 50 MG  TABS (ATENOLOL) Take 1 tablet by mouth once a day  #90 x prn   Entered by:   [name] CMA   Authorized by:   Michele Mcalpine MD   Signed by:   [name] CMA on 02/06/2009   Method used:   Print then Give to Patient   RxID:   [id number] ANUSOL-HC 2.5 % CREA (HYDROCORTISONE) apply as directed...  #1 tube... x prn  Entered and Authorized by:   Michele Mcalpine MD   Signed by:   Michele Mcalpine MD on 02/06/2009   Method used:   Print then Give to Patient   RxID:   [id number] MELOXICAM 7.5 MG TABS (MELOXICAM) take 1 tab by mouth once daily as needed for arthritis pain (take w/ a meal)  #30 x 5   Entered and Authorized by:   Michele Mcalpine MD   Signed by:   Michele Mcalpine MD on 02/06/2009   Method used:   Print then Give to Patient   RxID:   [id number] CHLORZOXAZONE 500 MG  TABS (CHLORZOXAZONE) take 1 tab by mouth three times a day as needed for muscle spasm...  #90 x 5   Entered and Authorized by:   Michele Mcalpine MD   Signed by:   Michele Mcalpine MD on 02/06/2009   Method used:   Print then Give to Patient   RxID:   [id number]

## 2010-02-11 NOTE — Progress Notes (Signed)
Summary: Records request from [facility]  Request for records received from [facility]. Request forwarded to [employer]. Wilder Glade  March 08, 2009 12:02 PM

## 2010-02-11 NOTE — Progress Notes (Signed)
Summary: order for labs  Phone Note Call from Patient   Caller: Patient Call For: Keryn Nessler Summary of Call: need order put in for cpx on web Initial call taken by: Rickard Patience,  February 01, 2009 4:44 PM  Follow-up for Phone Call        Pt has cpx sched for 1/26 and wants to have have done fasting on mon 1/24. Please advise thanks David [name]  February 01, 2009 4:51 PM    labs are in computer for pt on monday---called and spoke with pt and he is aware. [name] CMA  February 01, 2009 5:06 PM

## 2010-02-11 NOTE — Progress Notes (Signed)
Summary: pt cancelled  [facility] - pt cancelled appt and said he will call back to reschedule  ---- Converted from flag ---- ---- 05/28/2009 9:22 AM, Merita Norton Lloyd-Fate wrote: sent request to sleep center [facility]  May 28, 2009 9:22 A ------------------------------

## 2010-02-11 NOTE — Miscellaneous (Signed)
Summary: Orders Update  Clinical Lists Changes  Orders: Added new Referral order of Nuclear Stress Test (Nuc Stress Test) - Signed 

## 2010-02-11 NOTE — Assessment & Plan Note (Signed)
Summary: np6/ chestpain / pt has uhc/ gd  Medications Added ATENOLOL 100 MG TABS (ATENOLOL) 1 by mouth daily PEPCID AC 10 MG TABS (FAMOTIDINE) 1 by mouth daily NYQUIL 60-7.06-10-998 MG/30ML LIQD (PSEUDOEPH-DOXYLAMINE-DM-APAP) as needed HYDROCHLOROTHIAZIDE 25 MG TABS (HYDROCHLOROTHIAZIDE) 1 p odaily HYDROCHLOROTHIAZIDE 25 MG TABS (HYDROCHLOROTHIAZIDE) 1/2 by mouth daily      Allergies Added:    Visit Type:  Initial Consult Primary Provider:  Dr. Levander Campion  CC:  HTN and Chest Pain.  History of Present Illness: The patient presents for evaluation of hypertension which has recently been more difficult to control. In addition he has a remarkable family history for early onset heart disease. He himself has had some chest discomfort. He describes this discomfort as happening for approximately 6 months. It happens at rest. He thinks it may be somewhat positional. It is an upper heaviness. There is no radiation to the jaw or to the arms. There is no associated nausea vomiting or diaphoresis. He doesn't notice any palpitations, presyncope or syncope. He does not describe PND or orthopnea. He did have a stress perfusion study several years ago and thinks this was normal. He was hospitalized at that time with chest pain.  The patient has had hypertension for many years. He reports that recently it has been slightly more difficult to control. He had increased his dose of atenolol and hydrochlorothiazide was started. He said that with this his blood pressure has been better controlled. Of note he does snore. His wife witnesses apneic episodes and he has daytime somnolence.  Current Medications (verified): 1)  Bayer Aspirin 325 Mg  Tabs (Aspirin) .... Take 1 Tablet By Mouth Once A Day 2)  Atenolol 100 Mg Tabs (Atenolol) .Marland Kitchen.. 1 By Mouth Daily 3)  Simvastatin 80 Mg Tabs (Simvastatin) .... Take 1 Tab By Mouth At Bedtime.Marland KitchenMarland Kitchen 4)  Pepcid Ac 10 Mg Tabs (Famotidine) .Marland Kitchen.. 1 By Mouth Daily 5)  Dulcolax  Stool Softener 100 Mg  Caps (Docusate Sodium) .... As Needed 6)  Lorazepam 1 Mg  Tabs (Lorazepam) .... Take 1 Tablet By Mouth Three Times A Day As Needed For Nerves.Marland KitchenMarland Kitchen 7)  Nyquil 60-7.06-10-998 Mg/66ml Liqd (Pseudoeph-Doxylamine-Dm-Apap) .... As Needed 8)  Hydrochlorothiazide 25 Mg Tabs (Hydrochlorothiazide) .... 1/2 By Mouth Daily  Allergies (verified): 1)  ! Ceclor  Past History:  Past Medical History: HYPERTENSION (ICD-401.9) HYPERLIPIDEMIA (ICD-272.4) OVERWEIGHT (ICD-278.02) GERD (ICD-530.81) DIVERTICULOSIS OF COLON (ICD-562.10) FAMILY HX COLON CANCER (ICD-V16.0) Hx of FATTY LIVER DISEASE (ICD-571.8) BENIGN NEOPLASM OF ADRENAL GLAND (ICD-227.0) DEGENERATIVE JOINT DISEASE (ICD-715.90) SPONDYLOSIS, LUMBAR (ICD-721.3) ANXIETY (ICD-300.00)  Past Surgical History: Reviewed history from 02/06/2009 and no changes required. Nasal surgery S/P laparoscopic assisted sigmoid colectomy 10/09 by DrTsuei  Family History: Mother had breast cancer & colon cancer Family History of Heart Disease: sister & brothers (4 brothers with myocardial infarctions in their 30s) Diverticulitis, siblings, one has had successful segmental resection  Social History: Married Patient is a former smoker. quit in 1971(6-pack-year history) Alcohol Use - 2 drinks daily  Review of Systems       As stated in the HPI and negative for all other systems.   Vital Signs:  Patient profile:   65 year old male Height:      73 inches Weight:      256 pounds BMI:     33.90 Pulse rate:   63 / minute Resp:     16 per minute BP sitting:   148 / 90  (right arm)  Vitals Entered By: Marrion Coy,  CNA (May 24, 2009 11:32 AM)  Physical Exam  General:  Well developed, well nourished, in no acute distress. Head:  normocephalic and atraumatic Eyes:  PERRLA/EOM intact; conjunctiva and lids normal. Mouth:  Teeth, gums and palate normal. Oral mucosa normal. Neck:  Neck supple, no JVD. No masses, thyromegaly or  abnormal cervical nodes. Chest Wall:  no deformities or breast masses noted Lungs:  Clear bilaterally to auscultation and percussion. Abdomen:  Bowel sounds positive; abdomen soft and non-tender without masses, organomegaly, or hernias noted. No hepatosplenomegaly. Msk:  Back normal, normal gait. Muscle strength and tone normal. Extremities:  No clubbing or cyanosis. Neurologic:  Alert and oriented x 3. Skin:  Intact without lesions or rashes. Cervical Nodes:  no significant adenopathy Axillary Nodes:  no significant adenopathy Inguinal Nodes:  no significant adenopathy Psych:  Normal affect.   Detailed Cardiovascular Exam  Neck    Carotids: Carotids full and equal bilaterally without bruits.      Neck Veins: Normal, no JVD.    Heart    Inspection: no deformities or lifts noted.      Palpation: normal PMI with no thrills palpable.      Auscultation: regular rate and rhythm, S1, S2 without murmurs, rubs, gallops, or clicks.    Vascular    Abdominal Aorta: no palpable masses, pulsations, or audible bruits.      Femoral Pulses: normal femoral pulses bilaterally.      Pedal Pulses: normal pedal pulses bilaterally.      Radial Pulses: normal radial pulses bilaterally.      Peripheral Circulation: no clubbing, cyanosis, or edema noted with normal capillary refill.     EKG  Procedure date:  05/24/2009  Findings:      sinus rhythm, rate 63, left axis deviation, poor anterior R-wave progression, no acute ST-T wave changes.  Impression & Recommendations:  Problem # 1:  CHEST PAIN, PRECORDIAL (ICD-786.51) The patient's chest pain has some typical and some atypical features. However, he has extremely significant risk factors with a strong family history. Therefore, the pretest probability of obstructive coronary disease is at least moderate. Stress perfusion study would be indicated. He agrees to dissipate in the PROMISE trial and will be randomized to perfusion imaging versus coronary  CTA. Orders: Sleep Disorder Referral (Sleep Disorder) TLB-BMP (Basic Metabolic Panel-BMET) (80048-METABOL)  Problem # 2:  APNEA (ICD-786.03) He almost definitely has sleep apnea and will have a sleep study. This might be contributing to his blood pressure issues. Orders: Sleep Disorder Referral (Sleep Disorder)  Problem # 3:  OVERWEIGHT (ICD-278.02) We discussed weight loss strategies and exercise.  Problem # 4:  HYPERTENSION (ICD-401.9) His blood pressure is currently well controlled. He will continue on the meds as listed. Certainly weight loss and treatment of sleep apnea if identified would help. Orders: EKG w/ Interpretation (93000) TLB-BMP (Basic Metabolic Panel-BMET) (80048-METABOL)  Patient Instructions: 1)  Your physician recommends that you schedule a follow-up appointment as needed 2)  Your physician has recommended that you have a sleep study.  This test records several body functions during sleep, including:  brain activity, eye movement, oxygen and carbon dioxide blood levels, heart rate and rhythm, breathing rate and rhythm, the flow of air through your mouth and nose, snoring, body muscle movements, and chest and belly movement. 3)  You have been enrolled in research study.

## 2010-02-11 NOTE — Progress Notes (Signed)
Summary: Nuclear Pre-Procedure  Phone Note Outgoing Call Call back at [facility] Phone [phone]   Call placed by: Stanton Kidney, EMT-P,  May 30, 2009 3:13 PM Action Taken: Phone Call Completed Summary of Call: Left message with information on [employer] Information Sheet (see scanned document for details).     Nuclear Med Background Indications for Stress Test: Evaluation for Ischemia   History: Myocardial Perfusion Study  History Comments: Several years ago-MPS: NL (per patient)  Symptoms: Chest Pain    Nuclear Pre-Procedure Cardiac Risk Factors: Family History - CAD, History of Smoking, Hypertension, Lipids Height (in): 73

## 2010-02-11 NOTE — Assessment & Plan Note (Signed)
Summary: Cardiology Nuclear Study   Nuclear Med Background Indications for Stress Test: Evaluation for Ischemia   History: Myocardial Perfusion Study  History Comments:  ~'07 GNF:AOZHYQ per patient  Symptoms: Chest Pain, DOE  Symptoms Comments: Last episode of CP:1 week ago.   Nuclear Pre-Procedure Cardiac Risk Factors: Family History - CAD, History of Smoking, Hypertension, Lipids, RBBB Caffeine/Decaff Intake: None NPO After: 7:30 PM Lungs: Clear IV 0.9% NS with Angio Cath: 18g     IV Site: (R) AC IV Started by: Stanton Kidney EMT-P Chest Size (in) 50     Height (in): 73 Weight (lb): 254 BMI: 33.63 Tech Comments: Atenolol held x 24 hours, per patient.  Nuclear Med Study 1 or 2 day study:  1 day     Stress Test Type:  Stress Reading MD:  Willa Rough, MD     Referring MD:  [name], MD Resting Radionuclide:  Technetium 58m Tetrofosmin     Resting Radionuclide Dose:  11.0 mCi  Stress Radionuclide:  Technetium 65m Tetrofosmin     Stress Radionuclide Dose:  33.0 mCi   Stress Protocol Exercise Time (min):  9:00 min     Max HR:  141 bpm     Predicted Max HR:  156 bpm  Max Systolic BP: 216 mm Hg     Percent Max HR:  90.38 %     METS: 10.4 Rate Pressure Product:  65784    Stress Test Technologist:  Rea [employer] CMA-N     Nuclear Technologist:  Domenic Polite CNMT  Rest Procedure  Myocardial perfusion imaging was performed at rest 45 minutes following the intravenous administration of Myoview Technetium 32m Tetrofosmin.  Stress Procedure  The patient exercised for nine minutes.  The patient stopped due to fatigue.  He did c/o chest tightness, 3-4/10, that went completely away with rest.  There were nonspecific ST-T wave changes with exercise and more diffuse T-wave changes in recovery.  Myoview was injected at peak exercise and myocardial perfusion imaging was performed after a brief delay.  QPS Raw Data Images:  Patient motion noted; appropriate software correction  applied. Stress Images:  There is normal uptake in all areas. Rest Images:  Normal homogeneous uptake in all areas of the myocardium. Subtraction (SDS):  No evidence of ischemia. Transient Ischemic Dilatation:  .97  (Normal <1.22)  Lung/Heart Ratio:  .35  (Normal <0.45)  Quantitative Gated Spect Images QGS EDV:  87 ml QGS ESV:  30 ml QGS EF:  66 % QGS cine images:  Normal motion  Findings Normal nuclear study      Overall Impression  Exercise Capacity: Good exercise capacity. BP Response: Hypertensive blood pressure response. Clinical Symptoms: Chest tightness ECG Impression: There is 1mm ST depression in the inferolateral leads. Overall Impression Comments: There is chest tightness and mild EKG changes with stress. However, the nuclear images are normal. There is no scar or ischemia.  Appended Document: Cardiology Nuclear Study Normal stress perfusion study.  Appended Document: Cardiology Nuclear Study left message for pt to call back  Appended Document: Cardiology Nuclear Study  pt aware of results

## 2010-03-11 ENCOUNTER — Encounter: Payer: Self-pay | Admitting: Family Medicine

## 2010-03-11 ENCOUNTER — Ambulatory Visit: Payer: Self-pay | Admitting: Family Medicine

## 2010-03-11 ENCOUNTER — Other Ambulatory Visit: Payer: Self-pay | Admitting: Family Medicine

## 2010-03-11 ENCOUNTER — Ambulatory Visit (INDEPENDENT_AMBULATORY_CARE_PROVIDER_SITE_OTHER): Payer: 59 | Admitting: Family Medicine

## 2010-03-11 ENCOUNTER — Ambulatory Visit (HOSPITAL_BASED_OUTPATIENT_CLINIC_OR_DEPARTMENT_OTHER)
Admission: RE | Admit: 2010-03-11 | Discharge: 2010-03-11 | Disposition: A | Discharge status: Home / Self Care | Payer: 59 | Source: Ambulatory Visit | Attending: Family Medicine | Admitting: Family Medicine

## 2010-03-11 DIAGNOSIS — M25579 Pain in unspecified ankle and joints of unspecified foot: Secondary | ICD-10-CM | POA: Insufficient documentation

## 2010-03-11 DIAGNOSIS — J309 Allergic rhinitis, unspecified: Secondary | ICD-10-CM | POA: Insufficient documentation

## 2010-03-11 DIAGNOSIS — E782 Mixed hyperlipidemia: Secondary | ICD-10-CM | POA: Insufficient documentation

## 2010-03-11 DIAGNOSIS — I10 Essential (primary) hypertension: Secondary | ICD-10-CM | POA: Insufficient documentation

## 2010-03-11 DIAGNOSIS — M2550 Pain in unspecified joint: Secondary | ICD-10-CM | POA: Insufficient documentation

## 2010-03-11 DIAGNOSIS — R609 Edema, unspecified: Secondary | ICD-10-CM | POA: Insufficient documentation

## 2010-03-20 NOTE — Assessment & Plan Note (Signed)
Summary: CHRONIC ANKLE PAIN LEFT/NP/LP  # [id number]   Vital Signs:  Patient profile:   64 year old male Height:      73 inches (185.42 cm) Weight:      262.8 pounds (119.45 kg) BMI:     34.80 Temp:     97.9 degrees F (36.61 degrees C) oral Pulse rate:   71 / minute BP sitting:   115 / 75  (left arm)  Vitals Entered By: Baxter Hire) (March 11, 2010 8:47 AM) CC: Chronic Left Ankle Pain  Does patient need assistance? Functional Status Self care Ambulation Normal   Primary Care Provider:  Levander Campion  CC:  Chronic Left Ankle Pain.  History of Present Illness: 65 yo M here with left ankle pain  Patient reports remote history of severe left ankle sprain - has had other sprains as well but none in at least 6 months. Went to ED for his first one but did not do rehab following this - no fracture though. Pain worst lateral aspect of ankle - seems to improve with time, ibuprofen, and icing. However having more episodes of this pain associated with swelling. Does not have a brace for ankle. No prior x-rays or known DJD. No right ankle pain. Currently has no pain - last episode was about 2 weeks ago. No reported warmth, redness, h/o gout.  Habits & Providers  Alcohol-Tobacco-Diet     Alcohol drinks/day: 2     Tobacco Status: quit > 6 months  Current Problems (verified): 1)  Anxiety State, Unspecified  (ICD-300.00) 2)  Hyperlipidemia  (ICD-272.4) 3)  Essential Hypertension, Benign  (ICD-401.1) 4)  Allergic Rhinitis  (ICD-477.9) 5)  Ankle Pain, Left  (ICD-719.47)  Current Medications (verified): 1)  Lorazepam 1 Mg Tabs (Lorazepam) 2)  Hydrochlorothiazide 25 Mg Tabs (Hydrochlorothiazide) 3)  Simvastatin 20 Mg Tabs (Simvastatin) 4)  Astelin 137 Mcg/spray Soln (Azelastine Hcl) 5)  Atenolol 50 Mg Tabs (Atenolol)  Allergies (verified): 1)  ! Penicillin 2)  ! Ceclor 3)  ! Cephalosporins  Family History: + DM mother + heart disease father, brothers, sister +  HTN father, brother, mother, sister  Social History: quit smoking 6 months ago - prior 42 year 1ppd smoker + alcohol use works as an Office manager Status:  quit > 6 months  Physical Exam  General:  Well-developed,well-nourished,in no acute distress; alert,appropriate and cooperative throughout examination Msk:  L ankle: Mild swelling.  No warmth, bruising, deformity. No TTP fibular head, lat/med malleoli, base 5th, navicular, or elsewhere about foot/ankle - points to ATFL as area of most pain when occurs. FROM with 5/5 strength all directions. 2+ ant drawer compared to negative on right Negative talar tilt. Negative tibfib compression NVI distally.   Impression & Recommendations:  Problem # 1:  ANKLE PAIN, LEFT (ICD-719.47) Assessment New 2/2 chronic instability.  Prefers HEP instead of formal PT.  Given theraband exercises and instructed to do these every day for 6 weeks, balance exercise also demonstrated.  He declined ankle brace today - will use ACE wrap instead.  Brace does not feel supportive enough and was sized properly.  Aleve, icing, elevation as needed.  X-rays reviewed and minimal DJD at ankle joint.  Consider formal PT, encouraging he try the brace if pain persists instead of improves.  F/u in 6 weeks for recheck.  Complete Medication List: 1)  Lorazepam 1 Mg Tabs (Lorazepam) 2)  Hydrochlorothiazide 25 Mg Tabs (Hydrochlorothiazide) 3)  Simvastatin 20 Mg Tabs (Simvastatin) 4)  Astelin 137 Mcg/spray  Soln (Azelastine hcl) 5)  Atenolol 50 Mg Tabs (Atenolol)  Other Orders: Diagnostic X-Ray/Fluoroscopy (Diagnostic X-Ray/Flu)  Patient Instructions: 1)  Your x-rays show minimal evidence of arthritis. 2)  Ice 15 minutes at a time 3-4 times a day 3)  Aleve 1-2 tabs twice a day with food as needed. 4)  Elevate above the level of your heart when possible if swollen, ACE wrap (start near toes and work your way up). 5)  Start theraband exercises to strengthen tendons  around ankle and prevent excessive motion that leads to your pain. 6)  Arch supports are an option and may be helpful but the ankle brace and exercises are the two things that have been proven to decrease pain, swelling, and flare-ups. 7)  Theraband exercises - 3 sets of 10 each direction.  Add balance exercise once a day - 3 sets of 20 seconds.  If the balance exercise becomes too easy, try standing on a small pillow.  Be sure you have something close to grab onto to prevent from falling when doing this. 8)  Follow up with me in 6 weeks for a recheck.   Orders Added: 1)  Diagnostic X-Ray/Fluoroscopy [Diagnostic X-Ray/Flu] 2)  New Patient Level III [16109]

## 2010-05-27 NOTE — Assessment & Plan Note (Signed)
[facility]                                 ON-CALL NOTE   GLENVILLE, ESPINA                          MRN:          [id number]  DATE:08/11/2006                            DOB:          [birth year]    [name] called me today indicating he is having abdominal pain.  For  diverticulitis he saw Dr. Kriste Basque earlier, he is requesting information as  to what pain medicine to use.  I indicated he should use 800 mg of  Motrin x1 dose then 400 mg q.6 thereafter and alternate this with the  usage of Tylenol.  He is to call the office in the morning if he has  additional needs or go to the emergency room if worsens.     Charlcie Cradle Delford Field, MD, [facility]  Electronically Signed    PEW/MedQ  DD: 08/11/2006  DT: 08/12/2006  Job #: [id number]   cc:   Lonzo Cloud. Kriste Basque, MD

## 2010-05-27 NOTE — Discharge Summary (Signed)
David [name], David [name]                   ACCOUNT NO.:  [id number]   MEDICAL RECORD NO.:  [id number]          PATIENT TYPE:  INP   LOCATION:  1531                         FACILITY:  [facility]   PHYSICIAN:  Wilmon Arms. Corliss Skains, M.D. DATE OF BIRTH:  1945-01-31   DATE OF ADMISSION:  11/04/2007  DATE OF DISCHARGE:  11/08/2007                               DISCHARGE SUMMARY   ADMISSION DIAGNOSIS:  Recurrent sigmoid diverticulitis.   DISCHARGE DIAGNOSIS:  Recurrent sigmoid diverticulitis.   PROCEDURE:  Laparoscopic assisted sigmoid colectomy and rigid  proctoscopy.   BRIEF HISTORY:  The patient is a 65 year old male who has had several  recurrent episodes of diverticulitis.  These have never been complicated  by perforation.  However, these symptoms seem to be becoming more  frequent.  He has had a preoperative workup by his gastroenterologist  Dr. Leone Payor.  The diverticulosis was most severe in the sigmoid colon.  He presents for elective resection.   HOSPITAL COURSE:  The patient was admitted to hospital after bowel prep  on November 04, 2007.  He underwent a laparoscopic assisted sigmoid  colectomy.  We performed an end-to-end stapled EEA anastomosis.  This  was tested with a rigid proctoscopy and insufflation.  Postoperatively  the patient has done well.  The pathology confirmed only diverticulosis  in an 18 cm segment of sigmoid colon.  No sign of perforation.  Multiple  diverticula were seen.  The patient has done quite well.  He was on  Entereg protocol.  He had a bowel movement on postop day #3.  His diet  was advanced and he is being discharged on postop day #4.  His incision  looks good.   DISCHARGE INSTRUCTIONS:  Resume a regular diet.  The patient should  refrain from any heavy lifting.  He was given Vicodin p.r.n. for pain.  He may shower.  Follow up 1 week for staple removal.      Wilmon Arms. Tsuei, M.D.  Electronically Signed     MKT/MEDQ  D:  11/08/2007  T:  11/08/2007  Job:   [id number]   cc:   Iva Boop, MD,FACG  [facility]  [address] [place], [place] [address]

## 2010-05-27 NOTE — Op Note (Signed)
NAMEFINDLEY, VI                   ACCOUNT NO.:  [id number]   MEDICAL RECORD NO.:  [id number]          PATIENT TYPE:  INP   LOCATION:  0003                         FACILITY:  [facility]   PHYSICIAN:  Wilmon Arms. Corliss Skains, M.D. DATE OF BIRTH:  [birth year]   DATE OF PROCEDURE:  11/04/2007  DATE OF DISCHARGE:                               OPERATIVE REPORT   PREOPERATIVE DIAGNOSIS:  Recurrent sigmoid diverticulitis.   POSTOPERATIVE DIAGNOSIS:  Recurrent sigmoid diverticulitis.   PROCEDURE PERFORMED:  1. Laparoscopic assisted sigmoid colectomy.  2. Rigid proctoscopy.   SURGEON:  Wilmon Arms. Corliss Skains, MD.   ASSISTANT:  Juanetta Gosling, MD   ANESTHESIA:  General endotracheal.   INDICATIONS:  The patient is a 65 year old male who presents with  multiple recurrent episodes of diverticulitis.  This has increased to  the point where in the last year he has had at least three episodes.  He  has never had abscess or perforation.  The CT scan that he received back  in July showed significant sigmoid diverticulitis.  A colonoscopy on  September 09, 2007, showed diverticulosis scattered from the transverse  colon to the sigmoid colon but more severe diverticulosis in the sigmoid  colon.  He presents now for elective resection.   DESCRIPTION OF PROCEDURE:  The patient was brought to the operating room  and placed in a supine position on the operating table.  After an  adequate level of general anesthesia was obtained the Foley catheter was  placed under sterile technique.  The patient's legs were placed in  lithotomy position in Yellofin stirrups.  His abdomen and perineum were  prepped with Betadine and draped in a sterile fashion.  A time-out was  taken to ensure the proper patient and proper procedure.  In the left  upper quadrant we placed an 11 mm OptiView port into the peritoneal  cavity.  We insufflated CO2 maintaining a maximal pressure of 15 mmHg.  The patient was positioned in Trendelenburg  position.  Two 5 mm ports  were placed on the patient's left side to provide some working ports.  Glassman clamps were used to mobilize the descending colon medially.  There were some adhesions and inflammation in the left lower quadrant.  We tilted the patient to his right.  We opened the white line of Toldt  with the Enseal device.  We mobilized the colon medially.  I inserted  the GelPort device in the lower midline through a 7 cm incision.  I went  down between the legs and used the left hand to retract the colon  medially as we dissected superiorly.  We went up to the splenic flexure  and mobilized the colon.  Once the colon was adequately mobilized  proximally we turned our attention towards the pelvis.  We continued  dissecting next to the colon all the way down into the pelvis until we  were at the rectum.  The left ureter was clearly identified and was  uninjured.  Once we reached the rectum I bluntly dissected around the  rectum creating a small window in  the mesorectum.  Endo-GIA 45 stapler  was used to divide the rectum.  It took two firings of the stapler.  The  Enseal device was used to take the mesentery proximally.  We continued  dissecting until we were proximal to all of the area of thickening in  the sigmoid colon.  This was in the distal descending colon.  We then  exteriorized the specimen through the GelPort device wound protector.  We continued taking the mesentery all the way to the edge of the colon.  The colon was clamped with a Kocher clamp distally and a soft bowel  clamp proximally.  The colon was divided.  The prep was good.  The  specimen was passed off the field and sent for pathologic examination.  The staple line is distal.  We then used the EEA sizers and selected a  29 mm EEA stapler.  A 2-0 Prolene suture was used to create a  pursestring suture in the end of the proximal colon.  The 29 mm anvil  was inserted and secured with the pursestring suture.  We  then placed  the anvil back in the peritoneal cavity.  We replaced the GelPort and  reinsufflated.  We inspected the pelvis for hemostasis.  There was a  tiny bit of oozing but there was no significant bleeding that required  further cautery or Enseal.  Dr. Dwain Sarna went below and dilated the  anus up to three fingers.  The EEA stapler was inserted and advanced  under direct vision up to the end of the rectal stump.  The spike was  advanced through the rectal wall and mated with the anvil.  We closed  the stapler down, held it for a minute and then fired the stapler.  The  stapler was then opened and removed.  Two intact donuts were identified.  We then thoroughly irrigated the pelvis and filled it with saline.  Dr.  Dwain Sarna inserted a rigid proctoscope and insufflated.  No air bubbles  were noted.  The proctoscope was removed.  We suctioned out as much  irrigation as possible.  We removed the sponge which had previously been  placed in the peritoneal cavity.  Hemostasis was good.  Pneumoperitoneum  was then released as we removed the ports.  The left lower quadrant 12  mm port site was closed with a fascial suture of zero Vicryl.  PDS #1  was used to close the midline fascial incision.  Subcutaneous tissues  were irrigated and skin staples were used.  The patient was then  extubated and brought to the recovery in stable condition.  All sponge,  instrument and needle counts were correct.      Wilmon Arms. Tsuei, M.D.  Electronically Signed     MKT/MEDQ  D:  11/04/2007  T:  11/04/2007  Job:  [id number]

## 2010-07-25 ENCOUNTER — Other Ambulatory Visit: Payer: Self-pay | Admitting: Pulmonary Disease

## 2010-10-14 LAB — BASIC METABOLIC PANEL
BUN: 11
CO2: 28
Calcium: 8.5
Chloride: 103
Creatinine, Ser: 1.19
GFR calc Af Amer: >60
GFR calc non Af Amer: >60
Glucose, Bld: 130 — ABNORMAL HIGH
Potassium: 4.3
Sodium: 135

## 2010-10-14 LAB — COMPREHENSIVE METABOLIC PANEL
ALT: 29
AST: 25
Albumin: 4
Alkaline Phosphatase: 58
BUN: 15
CO2: 26
Calcium: 9.5
Chloride: 107
Creatinine, Ser: 1.42
GFR calc Af Amer: >60
GFR calc non Af Amer: 51 — ABNORMAL LOW
Glucose, Bld: 98
Potassium: 3.9
Sodium: 140
Total Bilirubin: 1
Total Protein: 6.7

## 2010-10-14 LAB — DIFFERENTIAL
Basophils Absolute: 0
Basophils Relative: 1
Eosinophils Absolute: 0.2
Eosinophils Relative: 4
Lymphocytes Relative: 28
Lymphs Abs: 1.6
Monocytes Absolute: 0.5
Monocytes Relative: 8
Neutro Abs: 3.4
Neutrophils Relative %: 59

## 2010-10-14 LAB — CBC
HCT: 40.6
HCT: 44.1
Hemoglobin: 13.6
Hemoglobin: 14.9
MCHC: 33.5
MCHC: 33.7
MCV: 93.9
MCV: 95
Platelets: 166
Platelets: 196
RBC: 4.28
RBC: 4.7
RDW: 14.7
RDW: 14.9
WBC: 5.7
WBC: 8.5

## 2010-10-14 LAB — TYPE AND SCREEN
ABO/RH(D): O NEG
Antibody Screen: NEGATIVE

## 2010-10-14 LAB — ABO/RH: ABO/RH(D): O NEG

## 2010-10-25 ENCOUNTER — Other Ambulatory Visit: Payer: Self-pay | Admitting: Pulmonary Disease

## 2010-11-12 ENCOUNTER — Emergency Department (HOSPITAL_BASED_OUTPATIENT_CLINIC_OR_DEPARTMENT_OTHER)
Admission: EM | Admit: 2010-11-12 | Discharge: 2010-11-12 | Disposition: A | Discharge status: Other Acute Inpatient Hospital | Payer: Medicare Other | Source: Home / Self Care | Attending: Emergency Medicine | Admitting: Emergency Medicine

## 2010-11-12 ENCOUNTER — Emergency Department (INDEPENDENT_AMBULATORY_CARE_PROVIDER_SITE_OTHER): Payer: Medicare Other

## 2010-11-12 ENCOUNTER — Inpatient Hospital Stay (HOSPITAL_COMMUNITY)
Admission: AD | Admit: 2010-11-12 | Discharge: 2010-11-14 | DRG: 310 | Disposition: A | Discharge status: Home / Self Care | Payer: Medicare Other | Source: Ambulatory Visit | Attending: Cardiology | Admitting: Cardiology

## 2010-11-12 DIAGNOSIS — Z79899 Other long term (current) drug therapy: Secondary | ICD-10-CM

## 2010-11-12 DIAGNOSIS — E785 Hyperlipidemia, unspecified: Secondary | ICD-10-CM | POA: Diagnosis present

## 2010-11-12 DIAGNOSIS — E7800 Pure hypercholesterolemia, unspecified: Secondary | ICD-10-CM | POA: Diagnosis present

## 2010-11-12 DIAGNOSIS — I4891 Unspecified atrial fibrillation: Principal | ICD-10-CM | POA: Diagnosis present

## 2010-11-12 DIAGNOSIS — I10 Essential (primary) hypertension: Secondary | ICD-10-CM | POA: Insufficient documentation

## 2010-11-12 DIAGNOSIS — Z87891 Personal history of nicotine dependence: Secondary | ICD-10-CM

## 2010-11-12 DIAGNOSIS — R002 Palpitations: Secondary | ICD-10-CM | POA: Insufficient documentation

## 2010-11-12 DIAGNOSIS — Z7901 Long term (current) use of anticoagulants: Secondary | ICD-10-CM

## 2010-11-12 HISTORY — DX: Ventricular premature depolarization: I49.3

## 2010-11-12 HISTORY — DX: Essential (primary) hypertension: I10

## 2010-11-12 HISTORY — DX: Diverticulitis of intestine, part unspecified, without perforation or abscess without bleeding: K57.92

## 2010-11-12 LAB — DIFFERENTIAL
Basophils Absolute: 0 10*3/uL (ref 0.0–0.1)
Basophils Relative: 0 % (ref 0–1)
Eosinophils Absolute: 0.2 10*3/uL (ref 0.0–0.7)
Monocytes Absolute: 0.8 10*3/uL (ref 0.1–1.0)
Monocytes Relative: 9 % (ref 3–12)
Neutrophils Relative %: 64 % (ref 43–77)

## 2010-11-12 LAB — CARDIAC PANEL(CRET KIN+CKTOT+MB+TROPI)
CK, MB: 4 ng/mL (ref 0.3–4.0)
Relative Index: 2.5 (ref 0.0–2.5)
Total CK: 162 U/L (ref 7–232)
Troponin I: <0.3 ng/mL (ref <0.30)

## 2010-11-12 LAB — URINALYSIS, ROUTINE W REFLEX MICROSCOPIC
Bilirubin Urine: NEGATIVE
Hgb urine dipstick: NEGATIVE
Specific Gravity, Urine: 1.017 (ref 1.005–1.030)
pH: 7.5 (ref 5.0–8.0)

## 2010-11-12 LAB — CBC
Hemoglobin: 16.3 g/dL (ref 13.0–17.0)
MCH: 31.4 pg (ref 26.0–34.0)
MCHC: 35.1 g/dL (ref 30.0–36.0)
RDW: 13.1 % (ref 11.5–15.5)

## 2010-11-12 LAB — BASIC METABOLIC PANEL
BUN: 20 mg/dL (ref 6–23)
Creatinine, Ser: 1.4 mg/dL — ABNORMAL HIGH (ref 0.50–1.35)
GFR calc Af Amer: 59 mL/min — ABNORMAL LOW (ref >90)
GFR calc non Af Amer: 51 mL/min — ABNORMAL LOW (ref >90)
Potassium: 3.6 mEq/L (ref 3.5–5.1)

## 2010-11-12 LAB — PROTIME-INR: INR: 0.99 (ref 0.00–1.49)

## 2010-11-12 LAB — TROPONIN I: Troponin I: <0.3 ng/mL (ref <0.30)

## 2010-11-12 MED ORDER — SODIUM CHLORIDE 0.45 % IV BOLUS
500.0000 mL | Freq: Once | INTRAVENOUS | Status: AC
  Administered 2010-11-12: 500 mL via INTRAVENOUS

## 2010-11-12 MED ORDER — DILTIAZEM HCL 25 MG/5ML IV SOLN
25.0000 mg | Freq: Once | INTRAVENOUS | Status: AC
  Administered 2010-11-12: 25 mg via INTRAVENOUS
  Filled 2010-11-12: qty 5

## 2010-11-12 MED ORDER — DILTIAZEM HCL 100 MG IV SOLR: 10.0000 mg/h | INTRAVENOUS | Status: DC

## 2010-11-12 MED ORDER — LORAZEPAM 2 MG/ML IJ SOLN
INTRAMUSCULAR | Status: AC
  Administered 2010-11-12: 0.5 mg
  Filled 2010-11-12: qty 1

## 2010-11-12 NOTE — ED Provider Notes (Signed)
Date: 11/12/2010  Rate:115  Rhythm: atrial fibrillation  QRS Axis: left  Intervals: normal  ST/T Wave abnormalities: nonspecific ST/T changes  Conduction Disutrbances:  Incomplete Right bundle branch block.  Narrative Interpretation: Rapid atrial fibrillation.  Old EKG Reviewed: changes noted  --  Was in sinus rhythm on 11/03/2007.    Carleene Cooper III, MD 11/12/10 [phone]

## 2010-11-12 NOTE — ED Provider Notes (Signed)
Patient is a 65 year old man with new onset atrial fibrillation. Examination showed irregular heart beat, clear lungs. EKG showed rapid atrial fibrillation. Patient was given IV Cardizem with slowing of his rate. He was accepted for transfer to [facility] by Cassell Clement M.D., cardiologist.  Medical screening examination/treatment/procedure(s) were conducted as a shared visit with non-physician practitioner(s) and myself.  I personally evaluated the patient during the encounter     Carleene Cooper III, MD 11/12/10 [phone]

## 2010-11-12 NOTE — ED Notes (Signed)
Woke with irregular HR this am-denies pain-states he does have BBB-dx 2 yrs ago

## 2010-11-12 NOTE — ED Notes (Signed)
Dr, Ignacia Palma at bedside.

## 2010-11-12 NOTE — ED Notes (Signed)
Pt transported to radiology.  Spouse at bedside and informed of plan of care.

## 2010-11-12 NOTE — ED Notes (Signed)
Pt returned from radiology. Pt reports feeling better now.  Bethany, PA-C at bedside.

## 2010-11-12 NOTE — ED Provider Notes (Signed)
History     CSN: [id number] Arrival date & time: 11/12/2010  1:20 PM   First MD Initiated Contact with Patient 11/12/10 1322      Chief Complaint  Patient presents with  . Palpitations    (Consider location/radiation/quality/duration/timing/severity/associated sxs/prior treatment) HPI  Patient presents to the emergency department complaining of acute onset palpitations that began shortly after intercourse this morning while laying in bed at approximately 7 AM. Patient states since onset symptoms of palpitations or "funny beating and chest" has continued. Denies aggravating or alleviating factors. Denies history of similar symptoms. Patient states he has had chest pain in the past and has been evaluated in the ER and [facility] cardiology but denies any associated chest pain or shortness of breath today. Patient denies fevers, chills, chest pain, shortness of breath, cough, abdominal pain, nausea, vomiting, or diarrhea. Patient states he "continued about my day" but states symptoms persisted and therefore presented to ER. Patient states she's taken all of his morning meds to include a daily aspirin, atorvastatin, hydrochlorothiazide, and atenolol. Denies any other anticoagulant use. Patient notes that he was diagnosed with a bundle-branch block approximately 2 years ago but denies history of any other arrhythmia.  Past Medical History  Diagnosis Date  . Bundle branch block   . Diverticulitis   . Hypertension   . Premature ventricular complex     Past Surgical History  Procedure Date  . Colon surgery   . Nose surgery     No family history on file.  History  Substance Use Topics  . Smoking status: Never Smoker   . Smokeless tobacco: Not on file  . Alcohol Use: Yes      Review of Systems  All other systems reviewed and are negative.    Allergies  Cefaclor; Cefaclor; Cephalosporins; and Penicillins  Home Medications   Current Outpatient Rx  Name Route Sig Dispense  Refill  . ATENOLOL 50 MG PO TABS  TAKE 1 TABLET BY MOUTH ONCE DAILY 90 tablet 0    BP 101/88  Pulse 96  Temp(Src) 98.2 F (36.8 C) (Oral)  Resp 16  Ht 6\' 1"  (1.854 m)  Wt 262 lb (118.842 kg)  BMI 34.57 kg/m2  SpO2 100%  Physical Exam  Nursing note and vitals reviewed. Constitutional: He is oriented to person, place, and time. He appears well-developed and well-nourished. No distress.  HENT:  Head: Normocephalic and atraumatic.  Eyes: Conjunctivae and EOM are normal. Pupils are equal, round, and reactive to light.  Neck: Normal range of motion. Neck supple.  Cardiovascular: Normal heart sounds and intact distal pulses.  An irregularly irregular rhythm present. Tachycardia present.  Exam reveals no gallop and no friction rub.   No murmur heard. Pulmonary/Chest: Effort normal and breath sounds normal. No respiratory distress. He has no wheezes. He has no rales. He exhibits no tenderness.  Abdominal: Bowel sounds are normal. He exhibits no distension and no mass. There is no tenderness. There is no rebound and no guarding.  Musculoskeletal: Normal range of motion. He exhibits no edema and no tenderness.  Neurological: He is alert and oriented to person, place, and time.  Skin: Skin is warm and dry. No rash noted. He is not diaphoretic. No erythema.  Psychiatric: He has a normal mood and affect.    ED Course  Procedures (including critical care time)  IV cardizem bolus followed by continuous infusion. Patient continues to deny CP. 14:07  2:54 PM Spoke with Dr. Patty Sermons who will accept patient in transfer  to [facility], to a cardiac telemetry bed. He requests IV heparin to be started. Patient continues to deny chest pain.  Labs Reviewed  CK TOTAL AND CKMB - Abnormal; Notable for the following:    CK, MB 4.5 (*)    All other components within normal limits  CBC  DIFFERENTIAL  TROPONIN I  URINALYSIS, ROUTINE W REFLEX MICROSCOPIC  BASIC METABOLIC PANEL   No results found.   1.  Atrial fibrillation with rapid ventricular response       MDM  Patient is stable to transfer denying chest pain to the service of Dr. Patty Sermons for new onset atrial fib. Patient is rate controlled at this time.   Medical screening examination/treatment/procedure(s) were conducted as a shared visit with non-physician practitioner(s) and myself.  I personally evaluated the patient during the encounter Osvaldo Human, M.D.      Jenness Corner, PA 11/12/10 1500  Carleene Cooper III, MD 11/13/10 1027

## 2010-11-12 NOTE — ED Notes (Signed)
Pt reports feeling anxious.  Bethany, PA-C informed and new orders received.

## 2010-11-13 DIAGNOSIS — I359 Nonrheumatic aortic valve disorder, unspecified: Secondary | ICD-10-CM

## 2010-11-13 DIAGNOSIS — I4891 Unspecified atrial fibrillation: Secondary | ICD-10-CM

## 2010-11-13 LAB — BASIC METABOLIC PANEL
BUN: 17 mg/dL (ref 6–23)
CO2: 23 mEq/L (ref 19–32)
Chloride: 106 mEq/L (ref 96–112)
GFR calc Af Amer: 58 mL/min — ABNORMAL LOW (ref >90)
Potassium: 3.5 mEq/L (ref 3.5–5.1)

## 2010-11-13 LAB — LIPID PANEL
HDL: 34 mg/dL — ABNORMAL LOW (ref >39)
Total CHOL/HDL Ratio: 4.3 RATIO

## 2010-11-13 LAB — CARDIAC PANEL(CRET KIN+CKTOT+MB+TROPI): Troponin I: <0.3 ng/mL (ref <0.30)

## 2010-11-13 LAB — CBC
HCT: 42.7 % (ref 39.0–52.0)
MCHC: 34.7 g/dL (ref 30.0–36.0)
Platelets: 192 10*3/uL (ref 150–400)
RDW: 13.4 % (ref 11.5–15.5)

## 2010-11-13 LAB — HEPARIN LEVEL (UNFRACTIONATED): Heparin Unfractionated: 0.45 IU/mL (ref 0.30–0.70)

## 2010-11-13 LAB — PROTIME-INR: Prothrombin Time: 13.4 seconds (ref 11.6–15.2)

## 2010-11-13 NOTE — H&P (Signed)
NAME:  David [name], David [name] NO.:  [id number]  MEDICAL RECORD NO.:  [id number]  LOCATION:  4729                         FACILITY:  [facility]  PHYSICIAN:  Cassell Clement, M.D. DATE OF BIRTH:  [birth year]  DATE OF ADMISSION:  11/12/2010 DATE OF DISCHARGE:                             HISTORY & PHYSICAL   CARDIOLOGIST:  [name], MD, [facility]  PRIMARY CARE PHYSICIAN:  Levander Campion, M.D.  CHIEF COMPLAINT:  New onset of atrial fib.  HISTORY:  This is a 65 year old gentleman who is admitted with new onset of atrial fibrillation.  He noted a fluttering in his chest this morning.  It did not bother him too much, and he went out and did some errands and went to some stores but it persisted and so he went to Dr. Reubin Milan office to get checked but she had already left for the day so then he went to the [facility], where an EKG was done, which confirmed that he was in atrial fibrillation with a rapid ventricular response.  He denies any chest discomfort.  He had mild dyspnea.  The patient does not have any awareness of prior episodes of atrial fib.  He has had no TIA or stroke symptoms.  He does not have any history of ischemic heart disease.  He has seen Dr. Antoine Poche in the past.  He has been enrolled in the PROMISE study in the past.  He states that in about 2010, Dr. Antoine Poche did a treadmill Myoview, and the patient went 17 minutes and the test was negative according to the patient.  I do not find the records of the stress test in the E-chart however.  The patient has a history of high blood pressure and hypercholesterolemia.  ALLERGIES:  He is allergic to CECLOR which causes hives.  MEDICATIONS: 1. Aspirin 325 daily. 2. Atenolol 50 mg daily. 3. Lipitor 40 mg daily. 4. Vitamin D 16109 units weekly. 5. Colchicine 0.6 mg p.r.n. for gout. 6. Flexeril 5 mg q.8 h. p.r.n. for muscle spasm. 7. Hydrochlorothiazide 12.5 mg daily for blood pressure. 8.  Generic hydrocodone 5/325, one daily for severe pain. 9. Lorazepam 1 mg t.i.d. p.r.n. 10.Aleve occasionally for arthritic pain. 11.Flonase 50 mcg inhaler p.r.n.  PAST SURGICAL HISTORY:  Reveals that he had diverticulosis surgery in October of 2009 for recurrent sigmoid diverticulitis.  Dr. Corliss Skains was his surgeon.  18 inches of his colon was removed according to the patient.  SOCIAL HISTORY:  The patient is married.  He lives with his wife.  He has 3 grown sons.  The patient works as an Airline pilot.  His home office is in [place], [place].  The patient smoked for 6 years and quit smoking in 1971.  He has 2 drinks of either hard liquor or wine each night or approximately 3 beers each night.  The patient has a diet that is high in caffeine including coffee, tea, and Coca Cola.  FAMILY HISTORY:  Father died of cancer and had an abdominal aortic aneurysm and a pacemaker.  Mother died of breast cancer, colon cancer, and dementia.  The patient has a sister who is on Coumadin, and other family members  have been on Coumadin.  REVIEW OF SYSTEMS:  Otherwise noncontributory.  Specifically, he has had no evidence of melena, hematochezia, GI bleeding, or hematuria.  All other systems reviewed and negative.  PHYSICAL EXAMINATION:  VITAL SIGNS:  Blood pressure 108/64, pulse is 102 and irregularly irregular, O2 saturation 97% on 2 L. GENERAL:  He is a well-developed, well-nourished male, in no distress. HEAD AND NECK:  Unremarkable.  Neck is supple.  No jugular venous distention.  No lymphadenopathy. CHEST:  Clear to percussion and auscultation. HEART:  Reveals no murmur, gallop, rub, or click. ABDOMEN:  Soft, without hepatosplenomegaly or masses. EXTREMITIES:  Show no phlebitis or edema. NEUROLOGIC:  Physiologic. SKIN:  Reveals no rash.  DIAGNOSTIC STUDIES:  Chest x-ray shows a normal heart size.  Clear lung fields.  No CHF.  EKG done at [facility] showed a rate of 115 and atrial  fibrillation.  He has left anterior hemiblock.  There are no ischemic changes.  His tracing is changed from the previous tracing of October 2009 which showed normal sinus rhythm.  Lab work; Hemoglobin of 16.3, hematocrit 46, white count 8900, platelets 227,000.  Sodium 140, potassium 3.6, BUN 20, creatinine 1.4.  Initial CK- MB 4.5, troponin less than 0.30.  IMPRESSION: 1. New onset of atrial fibrillation. 2. History of essential hypertension. 3. History of hypercholesterolemia. 4. History of high caffeine intake.  DISPOSITION:  He is being admitted to telemetry.  We will continue IV heparin, which [facility].  We will also start Coumadin by pharmacy protocol.  We will continue beta-blocker and add IV Cardizem. We will plan to get a 2-dimensional echocardiogram in the a.m.  We will get serial enzymes.  We have discussed with the patient the importance of decreased caffeine intake.          ______________________________ Cassell Clement, M.D.     TB/MEDQ  D:  11/12/2010  T:  11/13/2010  Job:  [id number]  Electronically Signed by Cassell Clement M.D. on 11/13/2010 09:25:34 PM

## 2010-11-14 LAB — BASIC METABOLIC PANEL
CO2: 28 mEq/L (ref 19–32)
Chloride: 106 mEq/L (ref 96–112)
Creatinine, Ser: 1.54 mg/dL — ABNORMAL HIGH (ref 0.50–1.35)
Glucose, Bld: 93 mg/dL (ref 70–99)

## 2010-11-14 LAB — CBC
HCT: 41.7 % (ref 39.0–52.0)
MCH: 31.9 pg (ref 26.0–34.0)
MCV: 92.5 fL (ref 78.0–100.0)
RDW: 13.4 % (ref 11.5–15.5)
WBC: 6.3 10*3/uL (ref 4.0–10.5)

## 2010-11-14 LAB — HEPARIN LEVEL (UNFRACTIONATED): Heparin Unfractionated: <0.1 IU/mL — ABNORMAL LOW (ref 0.30–0.70)

## 2010-11-15 NOTE — Discharge Summary (Addendum)
NAME:  David [name], David [name] NO.:  [id number]  MEDICAL RECORD NO.:  [id number]  LOCATION:  4729                         FACILITY:  [facility]  PHYSICIAN:  [name], MD, FACCDATE OF BIRTH:  1945-05-10  DATE OF ADMISSION:  11/12/2010 DATE OF DISCHARGE:  11/13/2010                              DISCHARGE SUMMARY   DISCHARGE DIAGNOSES: 1. New onset atrial fibrillation. 2. Hypertension. 3. Hyperlipidemia. 4. History of diverticular surgery in 2009.  HOSPITAL COURSE:  David [name] is a 65 year old gentleman with a history of hypertension and previous treadmill Myoview in 2010 said to be negative who presented to [name] with complaints of fluttering this morning.  He did some errands and it persisted.  He thus went to [name].  He denied any chest discomfort, but did have mild dyspnea.  He was found to be in new onset AFib.  At the time of evaluation pulse was 102.  He was initiated on IV heparin at [name] which was continued here in the hospital.  Coumadin was begun per pharmacy protocol.  IV Cardizem was added.  A 2D echocardiogram was obtained along with serial enzymes.  Enzymes were negative x3. Echocardiogram results are noted below.  He did have a mild 0.2 second pause noted on telemetry overnight.  By the time that Dr. Antoine [name] saw him his pulse was better in the low 90s.  It is currently running 105- 125.  This was discussed with Dr. Antoine [name] who would like to keep him on his home dose of atenolol for now and discontinue his Cardizem for good. The patient was changed to Pradaxa per Dr. Antoine [name] and his aspirin was continued.  Dr. Antoine [name] has seen and examined him today and feels he is stable for discharge.  DISCHARGE LABS:  WBC 7.6, hemoglobin 14.8, hematocrit 42.7, platelet count 192.  Sodium 141, potassium 3.5 chloride 106, CO2 of 23, glucose 123, BUN 17, creatinine 1.43.  Cardiac enzymes negative x3.  Total cholesterol 146,  triglycerides 115, HDL 34, LDL 89.  UA was negative.  STUDIES: 1. Chest x-ray on November 12, 2010, showed no acute cardiopulmonary     findings. 2. Echocardiogram November 13, 2010, showed mild LVH.  EF 55-60%.  No     regional wall motion abnormalities.  Mild aortic regurgitation.  PA     pressure was 31 mmHg.  DISCHARGE MEDICATIONS: 1. Pradaxa 150 mg every 12 hours. 2. Aleve 220 mg.  The patient usually takes 1 tablet daily as needed,     and he was  instructed to talk to his PCP about alternatives given     the increased risk of stomach bleeding while taking Pradaxa. 3. Atenolol 50 mg daily. 4. Atorvastatin 40 mg half tablet daily. 5. Colchicine 0.6 mg 2 tablets with the sign of gout attack, may take     1 more pill 1 hour later, repeat daily until resolves. 6. Fluticasone nasal 50 mcg 2 sprays each nostril daily as needed for     congestion. 7. Lorazepam 1 mg q.6 h. P.r.n. anxiety. 8. Nyquil syrup 30 mL by mouth every 6 hours as needed for     congestion/cough. 9. Vitamin  D2 50,000 units weekly.  His hydrochlorothiazide was discontinued because of borderline blood pressure this admission and a mildly elevated creatinine.  DISPOSITION:  David [name] will be discharged in stable condition to home. He is to increase activity slowly.  Follow a low-sodium, heart healthy diet.  He will follow up with Dr. Antoine [name] in November 27, 2010, at 11:30 am.  There are tentative plans to proceed with cardioversion if he is still in atrial fibrillation in the next upcoming weeks. He is also to follow up with his PCP to follow his blood sugar given elevated blood sugar here in hospital.  Duration of discharge encounter greater than 30 minutes including physician and PA time.     David [name], P.A.C.   ______________________________ [name], MD, [name]    DD/MEDQ  D:  11/13/2010  T:  11/14/2010  Job:  [id number]  cc:   Levander Campion, M.D.  Electronically Signed by David [name]  on  11/15/2010 12:01:14 PM Electronically Signed by [name] MD [facility] on 11/19/2010 05:08:08 PM

## 2010-11-19 NOTE — Discharge Summary (Signed)
  NAME:  David [name], David [name] NO.:  [id number]  MEDICAL RECORD NO.:  [id number]  LOCATION:  4729                         FACILITY:  [facility]  PHYSICIAN:  [name], MD, FACCDATE OF BIRTH:  1945/02/24  DATE OF ADMISSION:  11/12/2010 DATE OF DISCHARGE:  11/14/2010                              DISCHARGE SUMMARY   ADDENDUM:  Initially, the plan was to discharge Mr. Basaldua on November 13, 2010.  However, the heart rate control was suboptimal.  Meds were reviewed and the discharge was held.  Overnight, he converted to sinus rhythm in the 70s.  On November 14, 2010, Mr. Sollenberger was seen by Dr. Antoine Poche.  Medications were reviewed and it was recommended that he continue Pradaxa, to follow up as previously arranged.  Discharge instructions are unchanged.     Theodore Demark, PA-C   ______________________________ [name], MD, [facility]    RB/MEDQ  D:  11/14/2010  T:  11/15/2010  Job:  [id number]  cc:   Levander Campion, M.D.  Electronically Signed by Theodore Demark PA-C on 11/15/2010 10:06:51 PM Electronically Signed by [name] MD [facility] on 11/19/2010 05:08:09 PM

## 2010-11-27 ENCOUNTER — Encounter (INDEPENDENT_AMBULATORY_CARE_PROVIDER_SITE_OTHER): Payer: Medicare Other

## 2010-11-27 ENCOUNTER — Encounter: Payer: Self-pay | Admitting: Cardiology

## 2010-11-27 ENCOUNTER — Ambulatory Visit (INDEPENDENT_AMBULATORY_CARE_PROVIDER_SITE_OTHER): Payer: Medicare Other | Admitting: Cardiology

## 2010-11-27 VITALS — BP 138/88 | HR 60 | Resp 16 | Ht 73.0 in | Wt 260.0 lb

## 2010-11-27 DIAGNOSIS — I10 Essential (primary) hypertension: Secondary | ICD-10-CM

## 2010-11-27 DIAGNOSIS — I4891 Unspecified atrial fibrillation: Secondary | ICD-10-CM

## 2010-11-27 DIAGNOSIS — E663 Overweight: Secondary | ICD-10-CM

## 2010-11-27 NOTE — Patient Instructions (Addendum)
Your physician recommends that you continue on your current medications as directed. Please refer to the Current Medication list given to you today.  Your physician has recommended that you wear a 21 day event monitor. Event monitors are medical devices that record the heart's electrical activity. Doctors most often [place] these monitors to diagnose arrhythmias. Arrhythmias are problems with the speed or rhythm of the heartbeat. The monitor is a small, portable device. You can wear one while you do your normal daily activities. This is usually used to diagnose what is causing palpitations/syncope (passing out).  Please schedule a follow up appointment with Dr. Antoine Poche when this monitor is completed.    Please STOP your pradaxa

## 2010-11-27 NOTE — Assessment & Plan Note (Signed)
The blood pressure is at target. No change in medications is indicated. We will continue with therapeutic lifestyle changes (TLC).  

## 2010-11-27 NOTE — Progress Notes (Signed)
HPI The patient presents for followup after recent hospitalization for atrial fibrillation. He was in this rhythm spontaneously converted to sinus. I did start him on Pradaxa.  Since going home he has had rare isolated ectopic beats but no evidence or symptoms of sustained atrial fibrillation. He said he clearly felt when he stopped and started this rhythm. He has had no presyncope or syncope. The patient denies any new symptoms such as chest discomfort, neck or arm discomfort. There has been no new shortness of breath, PND or orthopnea. He has abstained from alcohol and caffeine.    Allergies  Allergen Reactions  . Cefaclor Hives  . Cephalosporins Hives  . Penicillins Hives    Current Outpatient Prescriptions  Medication Sig Dispense Refill  . atenolol (TENORMIN) 50 MG tablet TAKE 1 TABLET BY MOUTH ONCE DAILY  90 tablet  0  . atorvastatin (LIPITOR) 40 MG tablet Take 20 mg by mouth daily.       . calcium carbonate (TUMS EX) 750 MG chewable tablet Chew 1-2 tablets by mouth daily. For indigestion       . colchicine 0.6 MG tablet Take 1.2-1.8 mg by mouth daily. Take 2 tablets by mouth at first sign of a gout attack, may take 1 tablet 1 hour later. Repeat daily until attack is resolved       . cyclobenzaprine (FLEXERIL) 5 MG tablet Take 5 mg by mouth 3 (three) times daily as needed. For muscle spasms      . dabigatran (PRADAXA) 150 MG CAPS Take 150 mg by mouth every 12 (twelve) hours.        . fluticasone (FLONASE) 50 MCG/ACT nasal spray Place 2 sprays into the nose daily as needed. For congestion      . HYDROcodone-acetaminophen (NORCO) 5-325 MG per tablet Take 1 tablet by mouth every 4 (four) hours as needed. For pain      . LORazepam (ATIVAN) 1 MG tablet Take 1 mg by mouth every 6 (six) hours as needed. For anxiety      . naproxen sodium (ANAPROX) 220 MG tablet Take 220 mg by mouth daily as needed. For pain      . Pseudoeph-Doxylamine-DM-APAP (NYQUIL MULTI-SYMPTOM PO) Take 30 mLs by mouth every  6 (six) hours as needed. For cough and congestion       . Vitamin D, Ergocalciferol, (DRISDOL) 50000 UNITS CAPS Take 50,000 Units by mouth every 7 (seven) days. Wednesday         Past Medical History  Diagnosis Date  . Bundle branch block   . Diverticulitis   . Hypertension   . Premature ventricular complex     Past Surgical History  Procedure Date  . Colon surgery   . Nose surgery     ROS:  As stated in the HPI and negative for all other systems.  PHYSICAL EXAM BP 138/88  Pulse 60  Resp 16  Ht 6\' 1"  (1.854 m)  Wt 260 lb (117.935 kg)  BMI 34.30 kg/m2 GENERAL:  Well appearing HEENT:  Pupils equal round and reactive, fundi not visualized, oral mucosa unremarkable NECK:  No jugular venous distention, waveform within normal limits, carotid upstroke brisk and symmetric, no bruits, no thyromegaly LYMPHATICS:  No cervical, inguinal adenopathy LUNGS:  Clear to auscultation bilaterally BACK:  No CVA tenderness CHEST:  Unremarkable HEART:  PMI not displaced or sustained,S1 and S2 within normal limits, no S3, no S4, no clicks, no rubs, no murmurs ABD:  Flat, positive bowel sounds normal in frequency  in pitch, no bruits, no rebound, no guarding, no midline pulsatile mass, no hepatomegaly, no splenomegaly EXT:  2 plus pulses throughout, no edema, no cyanosis no clubbing SKIN:  No rashes no nodules NEURO:  Cranial nerves II through XII grossly intact, motor grossly intact throughout PSYCH:  Cognitively intact, oriented to person place and time  ASSESSMENT AND PLAN

## 2010-11-27 NOTE — Assessment & Plan Note (Signed)
The patient understands the need to lose weight with diet and exercise. We have discussed specific strategies for this.  

## 2010-11-27 NOTE — Assessment & Plan Note (Signed)
He is at low risk for thromboembolic events. He is now in sinus rhythm by exam. He thinks, and might actually be correct, that he does feel his fibrillation when it happens. He will stop his anticoagulation and start back on aspirin. I will apply a 21 day event monitor. If there is paroxysmal atrial fibrillation he and I will discuss the risk benefits of anticoagulation. He knows that alcohol might trigger these events.

## 2010-12-26 ENCOUNTER — Encounter: Payer: Self-pay | Admitting: Cardiology

## 2010-12-26 ENCOUNTER — Ambulatory Visit (INDEPENDENT_AMBULATORY_CARE_PROVIDER_SITE_OTHER): Payer: Medicare Other | Admitting: Cardiology

## 2010-12-26 DIAGNOSIS — I10 Essential (primary) hypertension: Secondary | ICD-10-CM

## 2010-12-26 DIAGNOSIS — E663 Overweight: Secondary | ICD-10-CM

## 2010-12-26 DIAGNOSIS — I4891 Unspecified atrial fibrillation: Secondary | ICD-10-CM

## 2010-12-26 MED ORDER — VITAMIN D (ERGOCALCIFEROL) 1.25 MG (50000 UNIT) PO CAPS: 50000.0000 [IU] | ORAL_CAPSULE | ORAL | Status: DC

## 2010-12-26 NOTE — Assessment & Plan Note (Signed)
The blood pressure is at target. No change in medications is indicated. We will continue with therapeutic lifestyle changes (TLC).  

## 2010-12-26 NOTE — Patient Instructions (Signed)
The current medical regimen is effective;  continue present plan and medications.  Follow up in 1 year with Dr Hochrein.  You will receive a letter in the mail 2 months before you are due.  Please call us when you receive this letter to schedule your follow up appointment.  

## 2010-12-26 NOTE — Assessment & Plan Note (Signed)
The patient understands the need to lose weight with diet and exercise. We have discussed specific strategies for this.  

## 2010-12-26 NOTE — Progress Notes (Signed)
HPI The patient presents for followup after recent hospitalization for atrial fibrillation.  He was not having any paroxysms when he saw me back in the office. I took him off of his anticoagulation as he is low risk for thromboembolic events. I put a 21 day event monitor on him and there was no fibrillation. He's had no symptomatic palpitations. He is exercising. To my chagrin he is drinking alcohol but he says in much moderation. He knows that this could trigger atrial fibrillation.  Allergies  Allergen Reactions  . Cefaclor Hives  . Cephalosporins Hives  . Penicillins Hives    Current Outpatient Prescriptions  Medication Sig Dispense Refill  . aspirin 325 MG tablet Take 325 mg by mouth daily.        Marland Kitchen atenolol (TENORMIN) 50 MG tablet TAKE 1 TABLET BY MOUTH ONCE DAILY  90 tablet  0  . atorvastatin (LIPITOR) 40 MG tablet Take 40 mg by mouth daily.       . calcium carbonate (TUMS EX) 750 MG chewable tablet Chew 1-2 tablets by mouth daily. For indigestion       . hyoscyamine (HYOMAX) 0.125 MG tablet Take 0.125 mg by mouth every 4 (four) hours as needed.        Marland Kitchen LORazepam (ATIVAN) 1 MG tablet Take 1 mg by mouth every 6 (six) hours as needed. For anxiety      . naproxen sodium (ANAPROX) 220 MG tablet Take 220 mg by mouth daily as needed. For pain      . Vitamin D, Ergocalciferol, (DRISDOL) 50000 UNITS CAPS Take 50,000 Units by mouth every 7 (seven) days. Wednesday       . colchicine 0.6 MG tablet Take 1.2-1.8 mg by mouth daily. Take 2 tablets by mouth at first sign of a gout attack, may take 1 tablet 1 hour later. Repeat daily until attack is resolved       . cyclobenzaprine (FLEXERIL) 5 MG tablet Take 5 mg by mouth 3 (three) times daily as needed. For muscle spasms      . fluticasone (FLONASE) 50 MCG/ACT nasal spray Place 2 sprays into the nose daily as needed. For congestion      . HYDROcodone-acetaminophen (NORCO) 5-325 MG per tablet Take 1 tablet by mouth every 4 (four) hours as needed.  For pain      . Pseudoeph-Doxylamine-DM-APAP (NYQUIL MULTI-SYMPTOM PO) Take 30 mLs by mouth every 6 (six) hours as needed. For cough and congestion         Past Medical History  Diagnosis Date  . Bundle branch block   . Diverticulitis   . Hypertension   . Premature ventricular complex     Past Surgical History  Procedure Date  . Colon surgery   . Nose surgery     ROS:  As stated in the HPI and negative for all other systems.  PHYSICAL EXAM BP 138/92  Pulse 68  Resp 18  Ht 6\' 1"  (1.854 m)  Wt 261 lb 12.8 oz (118.752 kg)  BMI 34.54 kg/m2 GENERAL:  Well appearing HEENT:  Pupils equal round and reactive, fundi not visualized, oral mucosa unremarkable NECK:  No jugular venous distention, waveform within normal limits, carotid upstroke brisk and symmetric, no bruits, no thyromegaly LUNGS:  Clear to auscultation bilaterally BACK:  No CVA tenderness CHEST:  Unremarkable HEART:  PMI not displaced or sustained,S1 and S2 within normal limits, no S3, no S4, no clicks, no rubs, no murmurs ABD:  Flat, positive bowel sounds normal  in frequency in pitch, no bruits, no rebound, no guarding, no midline pulsatile mass, no hepatomegaly, no splenomegaly, obese EXT:  2 plus pulses throughout, no edema, no cyanosis no clubbing SKIN:  No rashes no nodules   ASSESSMENT AND PLAN

## 2010-12-26 NOTE — Assessment & Plan Note (Signed)
There is no evidence of recurrent atrial fibrillation. No change in therapy is indicated. He will present to the emergency room with any sustained tachypalpitations.

## 2011-01-19 DIAGNOSIS — M419 Scoliosis, unspecified: Secondary | ICD-10-CM | POA: Insufficient documentation

## 2011-01-24 ENCOUNTER — Other Ambulatory Visit: Payer: Self-pay | Admitting: Pulmonary Disease

## 2011-01-29 ENCOUNTER — Telehealth: Payer: Self-pay | Admitting: Allergy

## 2011-01-29 NOTE — Telephone Encounter (Signed)
[facility] [address]  reqeusting rx for  Atenolol 50 mg  1 tablet daily #90  Last fill was 10/27/2010 Allergies  Allergen Reactions  . Cefaclor Hives  . Cephalosporins Hives  . Penicillins Hives   Dr Kriste Basque is this ok to fill

## 2011-01-30 NOTE — Telephone Encounter (Signed)
I spoke with pt and he states he will call back to make an apt. I advised pt i can;t send rx until he makes OV and he was fine with this and states he will call back

## 2011-01-30 NOTE — Telephone Encounter (Signed)
Pt needs ov for any refills.  Last ov with SN was 2011.  thanks

## 2011-03-02 ENCOUNTER — Emergency Department (INDEPENDENT_AMBULATORY_CARE_PROVIDER_SITE_OTHER): Payer: Medicare Other

## 2011-03-02 ENCOUNTER — Other Ambulatory Visit: Payer: Self-pay

## 2011-03-02 ENCOUNTER — Emergency Department (HOSPITAL_BASED_OUTPATIENT_CLINIC_OR_DEPARTMENT_OTHER)
Admission: EM | Admit: 2011-03-02 | Discharge: 2011-03-02 | Disposition: A | Discharge status: Home / Self Care | Payer: Medicare Other | Attending: Emergency Medicine | Admitting: Emergency Medicine

## 2011-03-02 ENCOUNTER — Encounter (HOSPITAL_BASED_OUTPATIENT_CLINIC_OR_DEPARTMENT_OTHER): Payer: Self-pay

## 2011-03-02 DIAGNOSIS — J9819 Other pulmonary collapse: Secondary | ICD-10-CM

## 2011-03-02 DIAGNOSIS — R0602 Shortness of breath: Secondary | ICD-10-CM

## 2011-03-02 DIAGNOSIS — M25579 Pain in unspecified ankle and joints of unspecified foot: Secondary | ICD-10-CM | POA: Insufficient documentation

## 2011-03-02 DIAGNOSIS — M109 Gout, unspecified: Secondary | ICD-10-CM | POA: Insufficient documentation

## 2011-03-02 DIAGNOSIS — E785 Hyperlipidemia, unspecified: Secondary | ICD-10-CM | POA: Insufficient documentation

## 2011-03-02 DIAGNOSIS — Z79899 Other long term (current) drug therapy: Secondary | ICD-10-CM | POA: Insufficient documentation

## 2011-03-02 DIAGNOSIS — R55 Syncope and collapse: Secondary | ICD-10-CM | POA: Insufficient documentation

## 2011-03-02 DIAGNOSIS — I4891 Unspecified atrial fibrillation: Secondary | ICD-10-CM | POA: Insufficient documentation

## 2011-03-02 DIAGNOSIS — R079 Chest pain, unspecified: Secondary | ICD-10-CM

## 2011-03-02 DIAGNOSIS — I10 Essential (primary) hypertension: Secondary | ICD-10-CM | POA: Insufficient documentation

## 2011-03-02 HISTORY — DX: Dorsalgia, unspecified: M54.9

## 2011-03-02 HISTORY — DX: Hyperlipidemia, unspecified: E78.5

## 2011-03-02 HISTORY — DX: Unspecified atrial fibrillation: I48.91

## 2011-03-02 HISTORY — DX: Anxiety disorder, unspecified: F41.9

## 2011-03-02 LAB — BASIC METABOLIC PANEL
BUN: 20 mg/dL (ref 6–23)
Chloride: 101 mEq/L (ref 96–112)
Glucose, Bld: 110 mg/dL — ABNORMAL HIGH (ref 70–99)
Potassium: 4.2 mEq/L (ref 3.5–5.1)

## 2011-03-02 LAB — CBC
HCT: 44.2 % (ref 39.0–52.0)
Hemoglobin: 15.3 g/dL (ref 13.0–17.0)
MCHC: 34.6 g/dL (ref 30.0–36.0)
WBC: 10.1 10*3/uL (ref 4.0–10.5)

## 2011-03-02 LAB — TROPONIN I: Troponin I: <0.3 ng/mL (ref <0.30)

## 2011-03-02 NOTE — ED Provider Notes (Signed)
History     CSN: [id number]  Arrival date & time 03/02/11  7829   First MD Initiated Contact with Patient 03/02/11 [phone]      Chief Complaint  Patient presents with  . Foot Pain  . Near Syncope     HPI Foot pain - b/l, began Friday (2/15) L>R, was having difficulty walking, sharp stabbing, then on Saturday, pain worsened in R>L and had difficulty walking down the stairs.  On right foot, pain is located over 1st toe, on left foot, pain is located along lateral aspect of foot and over achilles tendon as well as on dorsum of foot.  No numbness/tingling.  Worse with walking.  Progressively worsening, now 9/10.  Tried hydrocodone and alieve, more relief with alieve.  Took colchicine at 2am this morning because pain feels similar to gout flare.  No recent increase in red meats, but increased EtOH to 2 liquor drinks/night.  Last gout flare 2-3 months ago.  No recent trauma.  No pain at rest.   "Near Syncope" - this morning (around 7:30am), as patient was going down the stairs, became very SOB with fast breathing, became nauseated, diaphoretic, cold/clammy with lightheadedness, but no vomiting.  He completed the stairwell by sliding down on buttocks.  Symptoms resolved after sitting in chair, and currently asymptomatic.  Denies chest pain, palpitations, syncope, weakness, back pain then and now.  No other recent illness.  Has not had heart attack before, but 2 episodes of CP, 1st episode thought to be 2/2 spastic diaphragm (which has since recurred several times & he relieves with a drink of cold water) and 2nd episode he was hospitalized and MI ruled out.    Past Medical History  Diagnosis Date  . Bundle branch block   . Diverticulitis   . Hypertension   . Premature ventricular complex   . Atrial fibrillation   . Hyperlipidemia   . Gout   . Anxiety   . Back pain     Past Surgical History  Procedure Date  . Nose surgery   . Colon surgery     No family history on file.  History    Substance Use Topics  . Smoking status: Never Smoker   . Smokeless tobacco: Never Used  . Alcohol Use: Yes     2 drinks every other night      Review of Systems  Constitutional: Positive for diaphoresis and activity change (2/2 foot pain). Negative for fever, chills, appetite change, fatigue and unexpected weight change.  HENT: Negative.   Eyes: Negative for photophobia and visual disturbance.  Respiratory: Positive for shortness of breath. Negative for cough, choking, chest tightness, wheezing and stridor.   Cardiovascular: Negative for chest pain, palpitations and leg swelling.  Gastrointestinal: Positive for nausea. Negative for vomiting, abdominal pain, diarrhea, constipation, blood in stool and abdominal distention.  Genitourinary: Negative.   Neurological: Positive for dizziness (described as lightheaded). Negative for tremors, syncope, facial asymmetry, speech difficulty, weakness, numbness and headaches.  Psychiatric/Behavioral: Negative.     Allergies  Cefaclor; Cephalosporins; and Penicillins - hives  Home Medications   Current Outpatient Rx  Name Route Sig Dispense Refill  . ASPIRIN 325 MG PO TABS Oral Take 325 mg by mouth daily.      . ATENOLOL 50 MG PO TABS  TAKE 1 TABLET BY MOUTH ONCE DAILY 90 tablet 0  . ATORVASTATIN CALCIUM 40 MG PO TABS Oral Take 40 mg by mouth daily.     Marland Kitchen CALCIUM CARBONATE ANTACID 750  MG PO CHEW Oral Chew 1-2 tablets by mouth daily. For indigestion     . COLCHICINE 0.6 MG PO TABS Oral Take 1.2-1.8 mg by mouth daily. Take 2 tablets by mouth at first sign of a gout attack, may take 1 tablet 1 hour later. Repeat daily until attack is resolved     . CYCLOBENZAPRINE HCL 5 MG PO TABS Oral Take 5 mg by mouth 3 (three) times daily as needed. For muscle spasms    . FLUTICASONE PROPIONATE 50 MCG/ACT NA SUSP Nasal Place 2 sprays into the nose daily as needed. For congestion    . HYDROCODONE-ACETAMINOPHEN 5-325 MG PO TABS Oral Take 1 tablet by mouth every  4 (four) hours as needed. For pain    . HYOSCYAMINE SULFATE 0.125 MG PO TABS Oral Take 0.125 mg by mouth every 4 (four) hours as needed.      Marland Kitchen LORAZEPAM 1 MG PO TABS Oral Take 1 mg by mouth every 6 (six) hours as needed. For anxiety    . NAPROXEN SODIUM 220 MG PO TABS Oral Take 220 mg by mouth daily as needed. For pain    . NYQUIL MULTI-SYMPTOM PO Oral Take 30 mLs by mouth every 6 (six) hours as needed. For cough and congestion     . VITAMIN D (ERGOCALCIFEROL) 50000 UNITS PO CAPS Oral Take 1 capsule (50,000 Units total) by mouth every 7 (seven) days. Wednesday 30 capsule 1    BP 140/83  Pulse 72  Temp(Src) 98.2 F (36.8 C) (Oral)  Resp 18  Ht 6\' 1"  (1.854 m)  Wt 264 lb (119.75 kg)  BMI 34.83 kg/m2  SpO2 96%  Physical Exam  Nursing note and vitals reviewed. Constitutional: He is oriented to person, place, and time. He appears well-developed and well-nourished. No distress.  HENT:  Mouth/Throat: Oropharynx is clear and moist.  Eyes: Conjunctivae and EOM are normal. Pupils are equal, round, and reactive to light.  Neck: Normal range of motion. Neck supple. No JVD present. No tracheal deviation present. No thyromegaly present.  Cardiovascular: Normal rate, regular rhythm, normal heart sounds and intact distal pulses.  Exam reveals no gallop and no friction rub.   No murmur heard. Pulmonary/Chest: Effort normal and breath sounds normal. No respiratory distress. He has no wheezes. He has no rales. He exhibits no tenderness.  Abdominal: Soft. Bowel sounds are normal. He exhibits no distension and no mass. There is no tenderness. There is no rebound and no guarding.       obese  Musculoskeletal: He exhibits no edema.       Left foot dorsiflexion and plantarflexion limited by pain, left lateral malleolus swollen but not significantly tender to palpation; No significant erythema over right 1st metatarsal joint.  Area over right achilles tendon slightly tender to palpation but ROM intact    Lymphadenopathy:    He has no cervical adenopathy.  Neurological: He is alert and oriented to person, place, and time. No cranial nerve deficit. Coordination normal.  Skin: Skin is warm and dry. No rash noted. He is not diaphoretic. No erythema.  Psychiatric: He has a normal mood and affect. His behavior is normal.    ED Course  Procedures (including critical care time)  Labs Reviewed  BASIC METABOLIC PANEL - Abnormal; Notable for the following:    Glucose, Bld 110 (*)    GFR calc non Af Amer 56 (*)    GFR calc Af Amer 65 (*)    All other components within normal limits  TROPONIN I  CBC  TROPONIN I  TROPONIN I   Dg Chest 2 View  03/02/2011  *RADIOLOGY REPORT*  Clinical Data: Acute onset shortness of breath earlier this morning while walking down stairs.  History of left bundle branch block and cardiac arrhythmia.  History of hypertension.  CHEST - 2 VIEW 03/02/2011:  Comparison: Two-view chest x-ray 11/12/2010 [facility], 11/03/2007 [facility].  Findings: Cardiac silhouette upper normal in size but stable. Thoracic aorta mildly tortuous, unchanged.  Hilar and mediastinal contours otherwise unremarkable.  Mild atelectasis in the left lower lobe.  Lungs otherwise clear.  Pulmonary vascularity normal. No pleural effusions.  Visualized bony thorax intact.  IMPRESSION: Mild left lower lobe atelectasis.  No acute cardiopulmonary disease otherwise.  Original Report Authenticated By: Arnell Sieving, M.D.     Date: 03/02/2011  Rate: 75  Rhythm: normal sinus rhythm  QRS Axis: left  Intervals: normal  ST/T Wave abnormalities: normal  Conduction Disutrbances:none  Narrative Interpretation:   Old EKG Reviewed: unchanged    1. Gout   2. Shortness of breath       MDM  66 yo M with PMH significant for paroxysmal AF presents with acute onset SOB/diaphoresis/Nausea that has since resolved.  CBC to r/o anemia, BMET to r/o electrolyte abnormalities that may  predispose to arrythmia & CXR.    1:33 PM - 2 sets of trop negative with no significant changes on EKG, so unlikely ACS.  Hb and electrolytes wnl.  Asymptomatic since arrival.  D/c home with instructions to complete gout treatment, follow up with PCP and return to ED should symptoms recur/worsen.         Vernice Jefferson, MD 03/02/11 1335

## 2011-03-02 NOTE — Discharge Instructions (Signed)
Shortness of Breath Shortness of breath (dyspnea) is the feeling of uneasy breathing. Shortness of breath needs care right away. HOME CARE   Do not smoke.   Avoid being around chemicals that may bother your breathing (such as paint fumes or dust).   Rest as needed. Slowly begin your usual activities.   Only take medicine as told by your doctor. Inhaled medicines or oxygen might be part of your treatment.   Follow up with your doctor as told. Waiting to do so or failure to follow up could result in worsening your condition and possible disability or death.   Understand what to do or who to call if your shortness of breath gets worse.  GET HELP RIGHT AWAY IF:   You get pain in your chest, shoulders, belly (abdomen), or jaw.   You cannot stop coughing or you start wheezing.   You cough up blood or thick mucus.   You can only speak with short words.   You have a fever.   You feel your heart racing or skipping beats.   You are not breathing better when you stop and rest.   Your condition does not improve in the time expected.   You have problems with medicines.  MAKE SURE YOU:  Understand these instructions.   Will watch your condition.   Will get help right away if you are not doing well or get worse.  Document Released: 06/17/2007 Document Revised: 09/10/2010 Document Reviewed: 11/14/2008 [facility] Patient Information 2012 [place], [place].  Gout Gout is an inflammatory condition (arthritis) caused by a buildup of uric acid crystals in the joints. Uric acid is a chemical that is normally present in the blood. Under some circumstances, uric acid can form into crystals in your joints. This causes joint redness, soreness, and swelling (inflammation). Repeat attacks are common. Over time, uric acid crystals can form into masses (tophi) near a joint, causing disfigurement. Gout is treatable and often preventable. CAUSES  The disease begins with elevated levels of uric acid in the  blood. Uric acid is produced by your body when it breaks down a naturally found substance called purines. This also happens when you eat certain foods such as meats and fish. Causes of an elevated uric acid level include:  Being passed down from parent to child (heredity).   Diseases that cause increased uric acid production (obesity, psoriasis, some cancers).   Excessive alcohol use.   Diet, especially diets rich in meat and seafood.   Medicines, including certain cancer-fighting drugs (chemotherapy), diuretics, and aspirin.   Chronic kidney disease. The kidneys are no longer able to remove uric acid well.   Problems with metabolism.  Conditions strongly associated with gout include:  Obesity.   High blood pressure.   High cholesterol.   Diabetes.  Not everyone with elevated uric acid levels gets gout. It is not understood why some people get gout and others do not. Surgery, joint injury, and eating too much of certain foods are some of the factors that can lead to gout. SYMPTOMS   An attack of gout comes on quickly. It causes intense pain with redness, swelling, and warmth in a joint.   Fever can occur.   Often, only one joint is involved. Certain joints are more commonly involved:   Base of the big toe.   Knee.   Ankle.   Wrist.   Finger.  Without treatment, an attack usually goes away in a few days to weeks. Between attacks, you usually will  not have symptoms, which is different from many other forms of arthritis. DIAGNOSIS  Your caregiver will suspect gout based on your symptoms and exam. Removal of fluid from the joint (arthrocentesis) is done to check for uric acid crystals. Your caregiver will give you a medicine that numbs the area (local anesthetic) and use a needle to remove joint fluid for exam. Gout is confirmed when uric acid crystals are seen in joint fluid, using a special microscope. Sometimes, blood, urine, and X-ray tests are also used. TREATMENT    There are 2 phases to gout treatment: treating the sudden onset (acute) attack and preventing attacks (prophylaxis). Treatment of an Acute Attack  Medicines are used. These include anti-inflammatory medicines or steroid medicines.   An injection of steroid medicine into the affected joint is sometimes necessary.   The painful joint is rested. Movement can worsen the arthritis.   You may use warm or cold treatments on painful joints, depending which works best for you.   Discuss the use of coffee, vitamin C, or cherries with your caregiver. These may be helpful treatment options.  Treatment to Prevent Attacks After the acute attack subsides, your caregiver may advise prophylactic medicine. These medicines either help your kidneys eliminate uric acid from your body or decrease your uric acid production. You may need to stay on these medicines for a very long time. The early phase of treatment with prophylactic medicine can be associated with an increase in acute gout attacks. For this reason, during the first few months of treatment, your caregiver may also advise you to take medicines usually used for acute gout treatment. Be sure you understand your caregiver's directions. You should also discuss dietary treatment with your caregiver. Certain foods such as meats and fish can increase uric acid levels. Other foods such as dairy can decrease levels. Your caregiver can give you a list of foods to avoid. HOME CARE INSTRUCTIONS   Do not take aspirin to relieve pain. This raises uric acid levels.   Only take over-the-counter or prescription medicines for pain, discomfort, or fever as directed by your caregiver.   Rest the joint as much as possible. When in bed, keep sheets and blankets off painful areas.   Keep the affected joint raised (elevated).   Use crutches if the painful joint is in your leg.   Drink enough water and fluids to keep your urine clear or pale yellow. This helps your body  get rid of uric acid. Do not drink alcoholic beverages. They slow the passage of uric acid.   Follow your caregiver's dietary instructions. Pay careful attention to the amount of protein you eat. Your daily diet should emphasize fruits, vegetables, whole grains, and fat-free or low-fat milk products.   Maintain a healthy body weight.  SEEK MEDICAL CARE IF:   You have an oral temperature above 102 F (38.9 C).   You develop diarrhea, vomiting, or any side effects from medicines.   You do not feel better in 24 hours, or you are getting worse.  SEEK IMMEDIATE MEDICAL CARE IF:   Your joint becomes suddenly more tender and you have:   Chills.   An oral temperature above 102 F (38.9 C), not controlled by medicine.  MAKE SURE YOU:   Understand these instructions.   Will watch your condition.   Will get help right away if you are not doing well or get worse.  Document Released: 12/27/1999 Document Revised: 09/10/2010 Document Reviewed: 04/08/2009 [facility] Patient Information 2012 [place]  [place]. 

## 2011-03-02 NOTE — ED Notes (Signed)
Pt reports onset of left foot pain that started Saturday worsening this am.  He was going down stairs this am on buttocks and became diaphoretic and had a near syncopal episode.

## 2011-03-02 NOTE — ED Notes (Signed)
Attempted IV access x 2 unsuccessful in left hand.

## 2011-03-02 NOTE — ED Notes (Signed)
MD at bedside. 

## 2011-03-02 NOTE — ED Notes (Signed)
Secondary Assessment- Pt reports onset of left foot pain Saturday that has worsened today and unrelieved after taking "gout medicine".  Denies injury, no deformity noted.  He states he was attempting to go downstairs this am, had pain in foot so attempted down stairs on buttocks.  After going down a few steps he became "light-headed and diaphoretic".  Sat in a chair and symptoms resolved.  Pt denies chest pain and states symptoms have resolved at present time.

## 2011-03-02 NOTE — ED Notes (Signed)
Patient transported to X-ray via stretcher 

## 2011-03-02 NOTE — ED Notes (Signed)
Pt returned from radiology.

## 2011-03-02 NOTE — ED Provider Notes (Signed)
Patient with episode of dyspnea, and diaphoresis while crawling down steps.  Patient unable to walk down steps due to foot pain c.w. Prior gout.  EKG without acute changes.  Hilario Quarry, MD 03/02/11 [phone]

## 2011-05-07 ENCOUNTER — Other Ambulatory Visit: Payer: Self-pay | Admitting: Dermatology

## 2011-06-22 ENCOUNTER — Ambulatory Visit: Payer: Self-pay | Admitting: Family Medicine

## 2011-09-18 ENCOUNTER — Telehealth: Payer: Self-pay | Admitting: Internal Medicine

## 2011-09-18 NOTE — Telephone Encounter (Signed)
Patient has a several month history of lower abdominal pain.  The pain is intermittent and not associated with any other symptoms except for occasional constipation.  He is scheduled to see Dr Leone Payor on 10/02/11

## 2011-10-02 ENCOUNTER — Encounter: Payer: Self-pay | Admitting: Internal Medicine

## 2011-10-02 ENCOUNTER — Ambulatory Visit (INDEPENDENT_AMBULATORY_CARE_PROVIDER_SITE_OTHER): Payer: Medicare Other | Admitting: Internal Medicine

## 2011-10-02 VITALS — BP 120/80 | HR 68 | Ht 73.0 in | Wt 274.6 lb

## 2011-10-02 DIAGNOSIS — R109 Unspecified abdominal pain: Secondary | ICD-10-CM

## 2011-10-02 DIAGNOSIS — R150 Incomplete defecation: Secondary | ICD-10-CM

## 2011-10-02 DIAGNOSIS — K219 Gastro-esophageal reflux disease without esophagitis: Secondary | ICD-10-CM

## 2011-10-02 DIAGNOSIS — R1030 Lower abdominal pain, unspecified: Secondary | ICD-10-CM

## 2011-10-02 MED ORDER — DICYCLOMINE HCL 10 MG PO CAPS: 10.0000 mg | ORAL_CAPSULE | Freq: Three times a day (TID) | ORAL | Status: DC

## 2011-10-02 NOTE — Progress Notes (Signed)
  Subjective:    Patient ID: David [name], male    DOB: [birth year], 66 y.o.   MRN: [id number]  HPI The patient is a 66 year old white man I know from previous colonoscopy and problems with recurrent diverticulitis. Last seen about 2009. He injured his back early in 2013. He had been having rare episodes of bilateral lower quadrant pelvic soreness at times. This seemed to worsen when he was doing physical therapy and modify that it was better but he is still having intermittent problems any once to get checked out. He feels like he is better if ENTs his bowels and has incomplete defecation at this time. There is no rectal bleeding or change in stool caliber. Also describing frequent several times a week heartburn and indigestion. He is using some problems with incomplete relief. There is no dysphagia or bleeding. He feels gassy and bloated as well. Medications, allergies, past medical history, past surgical history, family history and social history are reviewed and updated in the EMR.  Review of Systems This is positive for back pain still comes and goes and he will intermittently use of naproxen and rarely hydrocodone and Flexeril. He has decreased hearing. He suffers from anxiety and his primary care has been trying to switch him to anti depressant-type medications that his Ativan which has been on for 15 years and is frustrated by this. All other review of systems are negative at this time.    Objective:   Physical Exam General:  Well-developed, well-nourished and in no acute distress-overweight Eyes:  anicteric. ENT:   Mouth and posterior pharynx free of lesions.  Neck:   supple w/o thyromegaly or mass.  Lungs: Clear to auscultation bilaterally. Heart:  S1S2, no rubs, murmurs, gallops. Abdomen:  soft, non-tender, no hepatosplenomegaly, hernia, or mass and BS+.  Lymph:  no cervical or supraclavicular adenopathy. Extremities:   no edema Skin   no rash. Neuro:  A&O x 3.  Psych:    appropriate mood and  Affect.   Data Reviewed:  2009 colonoscopy       Assessment & Plan:   1. Lower abdominal pain   2. Incomplete defecation    The symptoms probably represent some form of IBS. He will start a fiber supplement wrapping up for a teaspoon a time and a tablespoon of psyllium daily. Dicyclomine 10 mg every 6 hours as needed is prescribed, he used to use hyoscyamine but that is no longer on his formulary. CNS side effects discussed.   3. GERD (gastroesophageal reflux disease)   He has fairly frequent heartburn. Prilosec OTC daily, GERD diet. Weight loss encouraged.      I will see him again in 2 months to reassess.  CC: Tomma Lightning, MD

## 2011-10-02 NOTE — Patient Instructions (Addendum)
We have sent the following medications to your pharmacy for you to pick up at your convenience: dicyclomine  Dr. Leone Payor has suggested that you start using Metamucil - start with 1 teaspoon a day for a week, then 2 teaspoons a day for a week, and then 3 teaspoons for a week.  You may then titrate the amount as needed.  You may also take Prilosec over the counter for your reflux.   Please follow up with Dr. Leone Payor as needed

## 2011-12-04 ENCOUNTER — Ambulatory Visit (INDEPENDENT_AMBULATORY_CARE_PROVIDER_SITE_OTHER): Payer: Medicare Other | Admitting: Internal Medicine

## 2011-12-04 ENCOUNTER — Encounter: Payer: Self-pay | Admitting: Internal Medicine

## 2011-12-04 VITALS — BP 138/78 | HR 77 | Ht 72.05 in | Wt 273.0 lb

## 2011-12-04 DIAGNOSIS — K5900 Constipation, unspecified: Secondary | ICD-10-CM

## 2011-12-04 DIAGNOSIS — R109 Unspecified abdominal pain: Secondary | ICD-10-CM

## 2011-12-04 DIAGNOSIS — R1030 Lower abdominal pain, unspecified: Secondary | ICD-10-CM

## 2011-12-04 DIAGNOSIS — R12 Heartburn: Secondary | ICD-10-CM

## 2011-12-04 NOTE — Progress Notes (Signed)
Subjective:    Patient ID: David [name], male    DOB: Jun 30, 1945, 66 y.o.   MRN: [id number]  HPI The patient is here to follow-up after he was seen in September with lower abdominal pain and incomplete defecation. Also had heartburn/GERD. He is moving his bowels better and is less sore in the abdomen. Able to walk more. Trying to lose weight. Has been usiing metamucil but finds prune juice more effective to promote defecation - using that when he does not defecate, about every 2-3 days.  No bleeding. Gaviscon 2-3 x/week for heartburn. Minimial soda reported, some sweet tea, has stopped sugar in coffee  Current outpatient prescriptions:aspirin 325 MG tablet, Take 325 mg by mouth daily.  , Disp: , Rfl: ;  atenolol (TENORMIN) 50 MG tablet, TAKE 1 TABLET BY MOUTH ONCE DAILY, Disp: 90 tablet, Rfl: 0;  atorvastatin (LIPITOR) 40 MG tablet, Take 40 mg by mouth daily. , Disp: , Rfl: ;  calcium carbonate (TUMS EX) 750 MG chewable tablet, Chew 1-2 tablets by mouth daily. For indigestion , Disp: , Rfl:  colchicine 0.6 MG tablet, Take 1.2-1.8 mg by mouth daily. Take 2 tablets by mouth at first sign of a gout attack, may take 1 tablet 1 hour later. Repeat daily until attack is resolved , Disp: , Rfl: ;  cyclobenzaprine (FLEXERIL) 5 MG tablet, Take 5 mg by mouth 3 (three) times daily as needed. For muscle spasms, Disp: , Rfl:  dicyclomine (BENTYL) 10 MG capsule, Take 1 capsule (10 mg total) by mouth 4 (four) times daily -  before meals and at bedtime., Disp: 60 capsule, Rfl: 5;  fluticasone (FLONASE) 50 MCG/ACT nasal spray, Place 2 sprays into the nose daily as needed. For congestion, Disp: , Rfl: ;  HYDROcodone-acetaminophen (NORCO) 5-325 MG per tablet, Take 1 tablet by mouth every 4 (four) hours as needed. For pain, Disp: , Rfl:  LORazepam (ATIVAN) 1 MG tablet, Take 1 mg by mouth every 6 (six) hours as needed. For anxiety, Disp: , Rfl: ;  naproxen sodium (ANAPROX) 220 MG tablet, Take 220 mg by mouth daily as  needed. For pain, Disp: , Rfl: ;  Pseudoeph-Doxylamine-DM-APAP (NYQUIL MULTI-SYMPTOM PO), Take 30 mLs by mouth every 6 (six) hours as needed. For cough and congestion , Disp: , Rfl:  Vitamin D, Ergocalciferol, (DRISDOL) 50000 UNITS CAPS, Take 1 capsule (50,000 Units total) by mouth every 7 (seven) days. Wednesday, Disp: 30 capsule, Rfl: 1  Past Medical History  Diagnosis Date  . Bundle branch block   . Diverticulitis   . Hypertension   . Premature ventricular complex   . Atrial fibrillation   . Hyperlipidemia   . Gout   . Anxiety   . Back pain   . Internal hemorrhoids   . GERD (gastroesophageal reflux disease)    Past Surgical History  Procedure Date  . Nose surgery   . Colon surgery 2009    segmental sigmoid resection  . Colonoscopy     Review of Systems As above    Objective:   Physical Exam WDWN NAD Abdomen soft, obese and nontender Rectal - normal resting tone, normal anoderm, no mass, brown heme negative stool. Prostate is mildly enlarged but without nodule    Assessment & Plan:   1. Lower abdominal pain   2. Constipation   3. Heartburn    Better overall  1. Use prunes daily 2. Continue to try to lose weight 3. Antacids prn 4. Follow-up prn 5. Change colonoscopy recall to 2019 -  mother was 57 when dx with CRCA so current guidelines indicate 10 year interval  ZO:XWRUE, CHARITY, MD

## 2011-12-04 NOTE — Patient Instructions (Signed)
Glad you are better. Try eating several prunes a day as discussed and keep exercising and trying to lose weight. Please call back or come see me if things worsen or change.  Thank you for choosing me and [facility].  Iva Boop, MD, Clementeen Graham

## 2011-12-29 ENCOUNTER — Ambulatory Visit (INDEPENDENT_AMBULATORY_CARE_PROVIDER_SITE_OTHER): Payer: Medicare Other | Admitting: Cardiology

## 2011-12-29 ENCOUNTER — Encounter: Payer: Self-pay | Admitting: Cardiology

## 2011-12-29 VITALS — BP 160/90 | HR 59 | Ht 73.0 in | Wt 271.0 lb

## 2011-12-29 DIAGNOSIS — I4891 Unspecified atrial fibrillation: Secondary | ICD-10-CM

## 2011-12-29 MED ORDER — LISINOPRIL 2.5 MG PO TABS: 2.5000 mg | ORAL_TABLET | Freq: Every day | ORAL | Status: DC

## 2011-12-29 NOTE — Progress Notes (Signed)
HPI The patient presents for followup of a history of atrial fibrillation. I do note that he has had one ER visit for atypical chest pain since I last saw him. This was in February. However, since he has felt well. He has had stress but he's not had any tachycardia palpitations. He denies any chest pressure, neck or arm discomfort. He has had no shortness of breath, PND or orthopnea. He's had no weight gain or weight loss or edema. He says he's been limited somewhat by gout. This seems to be under better control with diet. He still drinks 2 alcoholic drinks per night.  Allergies  Allergen Reactions  . Cefaclor Hives  . Cephalosporins Hives  . Penicillins Hives    Current Outpatient Prescriptions  Medication Sig Dispense Refill  . allopurinol (ZYLOPRIM) 300 MG tablet Take 300 mg by mouth daily.      Marland Kitchen aspirin 81 MG tablet Take 81 mg by mouth daily.      Marland Kitchen atenolol (TENORMIN) 50 MG tablet TAKE 1 TABLET BY MOUTH ONCE DAILY  90 tablet  0  . atorvastatin (LIPITOR) 40 MG tablet Take 40 mg by mouth daily.       . calcium carbonate (TUMS EX) 750 MG chewable tablet Chew 1-2 tablets by mouth daily. For indigestion       . colchicine 0.6 MG tablet Take 1.2-1.8 mg by mouth daily. Take 2 tablets by mouth at first sign of a gout attack, may take 1 tablet 1 hour later. Repeat daily until attack is resolved       . cyclobenzaprine (FLEXERIL) 5 MG tablet Take 5 mg by mouth 3 (three) times daily as needed. For muscle spasms      . dicyclomine (BENTYL) 10 MG capsule Take 1 capsule (10 mg total) by mouth 4 (four) times daily -  before meals and at bedtime.  60 capsule  5  . fluticasone (FLONASE) 50 MCG/ACT nasal spray Place 2 sprays into the nose daily as needed. For congestion      . HYDROcodone-acetaminophen (NORCO) 5-325 MG per tablet Take 1 tablet by mouth every 4 (four) hours as needed. For pain      . LORazepam (ATIVAN) 1 MG tablet Take 1 mg by mouth every 6 (six) hours as needed. For anxiety      .  naproxen sodium (ANAPROX) 220 MG tablet Take 220 mg by mouth daily as needed. For pain      . Pseudoeph-Doxylamine-DM-APAP (NYQUIL MULTI-SYMPTOM PO) Take 30 mLs by mouth every 6 (six) hours as needed. For cough and congestion       . terbinafine (LAMISIL) 250 MG tablet Take 250 mg by mouth daily.      . Vitamin D, Ergocalciferol, (DRISDOL) 50000 UNITS CAPS Take 1 capsule (50,000 Units total) by mouth every 7 (seven) days. Wednesday  30 capsule  1    Past Medical History  Diagnosis Date  . LAFB (left anterior fascicular block)   . Diverticulitis   . Hypertension   . Premature ventricular complex   . Atrial fibrillation   . Hyperlipidemia   . Gout   . Anxiety   . Back pain   . Internal hemorrhoids   . GERD (gastroesophageal reflux disease)     Past Surgical History  Procedure Date  . Nose surgery   . Colon surgery 2009    segmental sigmoid resection  . Colonoscopy     ROS:  As stated in the HPI and negative for all  other systems.  PHYSICAL EXAM BP 160/90  Pulse 59  Ht 6\' 1"  (1.854 m)  Wt 271 lb (122.925 kg)  BMI 35.75 kg/m2 GENERAL:  Well appearing HEENT:  Pupils equal round and reactive, fundi not visualized, oral mucosa unremarkable NECK:  No jugular venous distention, waveform within normal limits, carotid upstroke brisk and symmetric, no bruits, no thyromegaly LUNGS:  Clear to auscultation bilaterally BACK:  No CVA tenderness CHEST:  Unremarkable HEART:  PMI not displaced or sustained,S1 and S2 within normal limits, no S3, no S4, no clicks, no rubs, no murmurs ABD:  Flat, positive bowel sounds normal in frequency in pitch, no bruits, no rebound, no guarding, no midline pulsatile mass, no hepatomegaly, no splenomegaly, obese EXT:  2 plus pulses throughout, no edema, no cyanosis no clubbing SKIN:  No rashes no nodules  EKG:  Sinus rhythm, rate 59, left anterior fascicular block, left axis deviation, first degree AV block, no acute ST-T wave changes.   ASSESSMENT AND  PLAN  Essential hypertension, benign -  The blood pressure is elevated. He has been on a couple of readings above target. Therefore, I will start lisinopril 2.5 mg daily in addition to therapeutic lifestyle changes. He can keep a blood pressure diary at home.  Atrial fibrillation -  There is no evidence of recurrent atrial fibrillation. No change in therapy is indicated.   OVERWEIGHT -  The patient understands the need to lose weight with diet and exercise. We have again discussed specific strategies for this.

## 2011-12-29 NOTE — Patient Instructions (Addendum)
Please start Lisinopril 2.5 mg a day Continue all other medications as listed  Follow up in 1 year with Dr Antoine Poche.  You will receive a letter in the mail 2 months before you are due.  Please call us when you receive this letter to schedule your follow up appointment.

## 2012-02-19 ENCOUNTER — Telehealth: Payer: Self-pay | Admitting: Cardiology

## 2012-02-19 NOTE — Telephone Encounter (Signed)
Pt has had some changes in his medication per his pcp and he feels his heart rate is a little fast and he wants to discuss this with you and see what Dr Antoine Poche wants him to do

## 2012-02-19 NOTE — Telephone Encounter (Signed)
3:20 pm BP recheck 155/96  HR 73.  Pt states he feels alittle better but not much.  He is reassured, asked to continue medications as Rxed and that BP will normalize once Atenolol has had a chance to get into his system.  He will monitor his BP thru the weekend and contact us with any concerns.

## 2012-02-19 NOTE — Telephone Encounter (Signed)
Per Dr Antoine Poche - pt to restart Atenolol 25 mg a day.  Pt walked into office stating he doesn't feel well and is very anxious.  BP 170/100 HR 78.  He reports he did take 25 mg Atenolol this am.  Remains very anxious.  He has his medications with him and is taking something for anxiety.  He will rest in an exam room and be rechecked in a few mins.  He is in agreement.

## 2012-02-19 NOTE — Telephone Encounter (Signed)
Per pt - went to see PCP who changed his medication because his BP was elevated.  She increased his Lisinopril to 10 mg a day and stopped his Atenolol.  Today his HR is 106 bpm.  He went back to his PCP who made no medication changes today and asked pt to call his cardiologist.  Per report pt does not feel like HR is 106 right now but he doesn't know how to check it.  His BP was 142/83 at the office.  Aware I will review with Dr Antoine Poche and call pt back.

## 2012-03-16 ENCOUNTER — Ambulatory Visit (INDEPENDENT_AMBULATORY_CARE_PROVIDER_SITE_OTHER): Payer: Medicare Other | Admitting: Physician Assistant

## 2012-03-16 ENCOUNTER — Encounter: Payer: Self-pay | Admitting: Physician Assistant

## 2012-03-16 VITALS — BP 170/82 | HR 65 | Ht 73.0 in | Wt 262.0 lb

## 2012-03-16 DIAGNOSIS — I10 Essential (primary) hypertension: Secondary | ICD-10-CM

## 2012-03-16 DIAGNOSIS — E663 Overweight: Secondary | ICD-10-CM

## 2012-03-16 DIAGNOSIS — I4891 Unspecified atrial fibrillation: Secondary | ICD-10-CM

## 2012-03-16 DIAGNOSIS — F411 Generalized anxiety disorder: Secondary | ICD-10-CM

## 2012-03-16 MED ORDER — ATENOLOL 50 MG PO TABS: 50.0000 mg | ORAL_TABLET | Freq: Every day | ORAL | Status: DC

## 2012-03-16 NOTE — Assessment & Plan Note (Signed)
No further evidence of atrial fibrillation. Patient has cut back on his alcohol use.

## 2012-03-16 NOTE — Progress Notes (Signed)
HPI:  This is a 67 y.o. Patient of Dr. Antoine Poche who has had one incident  of paroxysmal atrial fibrillation, hypertension, and obesity. Approximately 6 weeks ago the patient's primary care physician stopped his atenolol and put him on lisinopril for better blood pressure control. He said this caused him to be lightheaded and not feel good in general. His blood pressure seemed to run higher so he restarted 25 mg a day atenolol and decrease his lisinopril to 5 mg daily. His blood pressure is still running high and he is here for further evaluation. He says he has always done well on a atenolol 50 mg daily. He has a lot of anxiety which he says may be causing the lightheadedness. He takes Ativan 3 times a day for this. He has had no symptoms of atrial fibrillation. He denies chest pain palpitations, dyspnea, dyspnea on exertion, dizziness, or presyncope. The patient does eat out fast food establishment and uses a salt shaker. He has cut back on his alcohol. He drinks approximately 750 mL a month. He says this is the smallest amount he is ever drank. He is also trying to lose weight at a rate of 1 pound per week with good portion control.  Allergies:  -- Cefaclor -- Hives  -- Cephalosporins -- Hives  -- Penicillins -- Hives  Current Outpatient Prescriptions on File Prior to Visit: allopurinol (ZYLOPRIM) 300 MG tablet, Take 300 mg by mouth daily., Disp: , Rfl:  aspirin 81 MG tablet, Take 81 mg by mouth daily., Disp: , Rfl:  atenolol (TENORMIN) 50 MG tablet, TAKE 1 TABLET BY MOUTH ONCE DAILY, Disp: 90 tablet, Rfl: 0 atorvastatin (LIPITOR) 40 MG tablet, Take 40 mg by mouth daily. , Disp: , Rfl:  calcium carbonate (TUMS EX) 750 MG chewable tablet, Chew 1-2 tablets by mouth daily. For indigestion , Disp: , Rfl:  colchicine 0.6 MG tablet, Take 1.2-1.8 mg by mouth daily. Take 2 tablets by mouth at first sign of a gout attack, may take 1 tablet 1 hour later. Repeat daily until attack is resolved, Disp: , Rfl:   cyclobenzaprine (FLEXERIL) 5 MG tablet, Take 5 mg by mouth 3 (three) times daily as needed. For muscle spasms, Disp: , Rfl:  dicyclomine (BENTYL) 10 MG capsule, Take 1 capsule (10 mg total) by mouth 4 (four) times daily -  before meals and at bedtime., Disp: 60 capsule, Rfl: 5 fluticasone (FLONASE) 50 MCG/ACT nasal spray, Place 2 sprays into the nose daily as needed. For congestion, Disp: , Rfl:  HYDROcodone-acetaminophen (NORCO) 5-325 MG per tablet, Take 1 tablet by mouth every 4 (four) hours as needed. For pain, Disp: , Rfl:  lisinopril (PRINIVIL,ZESTRIL) 2.5 MG tablet, Take 1 tablet (2.5 mg total) by mouth daily., Disp: 30 tablet, Rfl: 11 LORazepam (ATIVAN) 1 MG tablet, Take 1 mg by mouth every 6 (six) hours as needed. For anxiety, Disp: , Rfl:  naproxen sodium (ANAPROX) 220 MG tablet, Take 220 mg by mouth daily as needed. For pain, Disp: , Rfl:  Pseudoeph-Doxylamine-DM-APAP (NYQUIL MULTI-SYMPTOM PO), Take 30 mLs by mouth every 6 (six) hours as needed. For cough and congestion , Disp: , Rfl:  terbinafine (LAMISIL) 250 MG tablet, Take 250 mg by mouth daily., Disp: , Rfl:  Vitamin D, Ergocalciferol, (DRISDOL) 50000 UNITS CAPS, Take 1 capsule (50,000 Units total) by mouth every 7 (seven) days. Wednesday, Disp: 30 capsule, Rfl: 1  No current facility-administered medications on file prior to visit.   Past Medical History:   LAFB (left anterior  fascicular block)                        Diverticulitis                                               Hypertension                                                 Premature ventricular complex                                Atrial fibrillation                                          Hyperlipidemia                                               Gout                                                         Anxiety                                                      Back pain                                                    Internal hemorrhoids                                          GERD (gastroesophageal reflux disease)                      Past Surgical History:   NOSE SURGERY                                                  COLON SURGERY                                    2009           Comment:segmental sigmoid resection   COLONOSCOPY  Review of patient's family history indicates:   Heart disease                  Mother                   Colon cancer                   Mother                   Heart disease                  Father                   Heart disease                  Sister                   Heart disease                  Brother                    Comment: x5   Social History   Marital Status: Married             Spouse Name:                      Years of Education:                 Number of children: 3           Occupational History Occupation          Psychologist, prison and probation services                                      Social History Main Topics   Smoking Status: Former Smoker                   Packs/Day: 0.00  Years:         Smokeless Status: Never Used                       Alcohol Use: Yes               Comment: 2 drinks every other night   Drug Use: No             Sexual Activity: Not on file        Other Topics            Concern   None on file  Social History Narrative   None on file    ROS:see history of present illness otherwise negative   PHYSICAL EXAM: Well-nournished, in no acute distress. Neck: No JVD, HJR, Bruit, or thyroid enlargement  Lungs: No tachypnea, clear without wheezing, rales, or rhonchi  Cardiovascular: RRR, PMI not displaced, heart sounds normal, no murmurs, gallops, bruit, thrill, or heave.  Abdomen: BS normal. Soft without organomegaly, masses, lesions or tenderness.  Extremities: without cyanosis, clubbing or edema. Good distal pulses bilateral  SKin: Warm, no lesions or rashes   Musculoskeletal: No  deformities  Neuro: no focal signs  Ht 6\' 1"  (1.854 m)  Wt 262 lb (118.842 kg)  BMI 34.57 kg/m2   JYN:WGNFAO sinus rhythm at 65 beats per minute with incomplete right bundle branch block, poor R-wave progression.This is similar to EKG in February 2013

## 2012-03-16 NOTE — Patient Instructions (Addendum)
Your physician recommends that you schedule a follow-up appointment in: 1 MONTH WITH DR [facility]  Your physician has recommended you make the following change in your medication:  INCREASE ATENOLOL TO 50 MG EVERY DAY  Your physician discussed the importance of regular exercise and recommended that you start or continue a regular exercise program for good health. .2 Gram Low Sodium Diet A 2 gram sodium diet restricts the amount of sodium in the diet to no more than 2 g or 2000 mg daily. Limiting the amount of sodium is often used to help lower blood pressure. It is important if you have heart, liver, or kidney problems. Many foods contain sodium for flavor and sometimes as a preservative. When the amount of sodium in a diet needs to be low, it is important to know what to look for when choosing foods and drinks. The following includes some information and guidelines to help make it easier for you to adapt to a low sodium diet. QUICK TIPS  Do not add salt to food.  Avoid convenience items and fast food.  Choose unsalted snack foods.  Buy lower sodium products, often labeled as "lower sodium" or "no salt added."  Check food labels to learn how much sodium is in 1 serving.  When eating at a restaurant, ask that your food be prepared with less salt or none, if possible. READING FOOD LABELS FOR SODIUM INFORMATION The nutrition facts label is a good place to find how much sodium is in foods. Look for products with no more than 500 to 600 mg of sodium per meal and no more than 150 mg per serving. Remember that 2 g = 2000 mg. The food label may also list foods as:  Sodium-free: Less than 5 mg in a serving.  Very low sodium: 35 mg or less in a serving.  Low-sodium: 140 mg or less in a serving.  Light in sodium: 50% less sodium in a serving. For example, if a food that usually has 300 mg of sodium is changed to become light in sodium, it will have 150 mg of sodium.  Reduced sodium: 25% less  sodium in a serving. For example, if a food that usually has 400 mg of sodium is changed to reduced sodium, it will have 300 mg of sodium. CHOOSING FOODS Grains  Avoid: Salted crackers and snack items. Some cereals, including instant hot cereals. Bread stuffing and biscuit mixes. Seasoned rice or pasta mixes.  Choose: Unsalted snack items. Low-sodium cereals, oats, puffed wheat and rice, shredded wheat. English muffins and bread. Pasta. Meats  Avoid: Salted, canned, smoked, spiced, pickled meats, including fish and poultry. Bacon, ham, sausage, cold cuts, hot dogs, anchovies.  Choose: Low-sodium canned tuna and salmon. Fresh or frozen meat, poultry, and fish. Dairy  Avoid: Processed cheese and spreads. Cottage cheese. Buttermilk and condensed milk. Regular cheese.  Choose: Milk. Low-sodium cottage cheese. Yogurt. Sour cream. Low-sodium cheese. Fruits and Vegetables  Avoid: Regular canned vegetables. Regular canned tomato sauce and paste. Frozen vegetables in sauces. Olives. Rosita Fire. Relishes. Sauerkraut.  Choose: Low-sodium canned vegetables. Low-sodium tomato sauce and paste. Frozen or fresh vegetables. Fresh and frozen fruit. Condiments  Avoid: Canned and packaged gravies. Worcestershire sauce. Tartar sauce. Barbecue sauce. Soy sauce. Steak sauce. Ketchup. Onion, garlic, and table salt. Meat flavorings and tenderizers.  Choose: Fresh and dried herbs and spices. Low-sodium varieties of mustard and ketchup. Lemon juice. Tabasco sauce. Horseradish. SAMPLE 2 GRAM SODIUM MEAL PLAN Breakfast / Sodium (mg)  1 cup  low-fat milk / 143 mg  2 slices whole-wheat toast / 270 mg  1 tbs heart-healthy margarine / 153 mg  1 hard-boiled egg / 139 mg  1 small orange / 0 mg Lunch / Sodium (mg)  1 cup raw carrots / 76 mg   cup hummus / 298 mg  1 cup low-fat milk / 143 mg   cup red grapes / 2 mg  1 whole-wheat pita bread / 356 mg Dinner / Sodium (mg)  1 cup whole-wheat pasta / 2  mg  1 cup low-sodium tomato sauce / 73 mg  3 oz lean ground beef / 57 mg  1 small side salad (1 cup raw spinach leaves,  cup cucumber,  cup yellow bell pepper) with 1 tsp olive oil and 1 tsp red wine vinegar / 25 mg Snack / Sodium (mg)  1 container low-fat vanilla yogurt / 107 mg  3 graham cracker squares / 127 mg Nutrient Analysis  Calories: 2033  Protein: 77 g  Carbohydrate: 282 g  Fat: 72 g  Sodium: 1971 mg Document Released: 12/29/2004 Document Revised: 03/23/2011 Document Reviewed: 04/01/2009 [facility] Patient Information 2013 [place], [place].

## 2012-03-16 NOTE — Assessment & Plan Note (Signed)
This seems to be a significant problem for him and he is very focused on taking Ativan. I asked him to begin exercising again in hopes that this will help decrease his anxiety

## 2012-03-16 NOTE — Assessment & Plan Note (Signed)
Trying to lose weight with portion control

## 2012-03-16 NOTE — Assessment & Plan Note (Signed)
Patient's blood pressure is elevated. I will increase his atenolol to 50 mg daily. Continue lisinopril 5 mg daily. 2 g sodium diet. If his blood pressure continues to be elevated we may want to add low-dose hydrochlorothiazide. He's had this in the past and it had taken care of his blood pressure. Anxiety may also be playing a role. He takes Ativan 3 times a day. He says he was taking it 4 times a day but was told to cut back. He has seen psychiatrists in the past but is not wanting to return.

## 2012-05-09 ENCOUNTER — Ambulatory Visit (INDEPENDENT_AMBULATORY_CARE_PROVIDER_SITE_OTHER): Payer: Medicare Other | Admitting: Cardiology

## 2012-05-09 ENCOUNTER — Encounter: Payer: Self-pay | Admitting: Cardiology

## 2012-05-09 VITALS — BP 149/89 | HR 70 | Ht 73.0 in | Wt 263.0 lb

## 2012-05-09 DIAGNOSIS — I10 Essential (primary) hypertension: Secondary | ICD-10-CM

## 2012-05-09 DIAGNOSIS — I4891 Unspecified atrial fibrillation: Secondary | ICD-10-CM

## 2012-05-09 MED ORDER — ATENOLOL 25 MG PO TABS: 25.0000 mg | ORAL_TABLET | Freq: Two times a day (BID) | ORAL | Status: DC

## 2012-05-09 MED ORDER — LISINOPRIL 5 MG PO TABS: 5.0000 mg | ORAL_TABLET | Freq: Two times a day (BID) | ORAL | Status: DC

## 2012-05-09 NOTE — Patient Instructions (Addendum)
Please increase you Atenolol to 25 mg one tablet twice a day Increase Lisinopril to 5 mg one tablet twice a day Continue all other medications as listed.  Follow up in 6 months with Dr Antoine Poche.  You will receive a letter in the mail 2 months before you are due.  Please call us when you receive this letter to schedule your follow up appointment.

## 2012-05-09 NOTE — Progress Notes (Signed)
HPI The patient presents for followup of a history of atrial fibrillation and HTN. He has had No symptoms since he was last seen. However, his blood pressure has still been running slightly high. We have been adjusting his medications. At one point in time he had been off of his atenolol but didn't do well with this with palpitations. She's not feeling any palpitations. He denies any chest discomfort. He has some arm discomfort but this is associated with certain positions and goes away actually with activity. He denies any neck discomfort. He's had no shortness of breath, PND or orthopnea. He's had some weight loss.  Allergies  Allergen Reactions  . Cefaclor Hives  . Cephalosporins Hives  . Penicillins Hives    Current Outpatient Prescriptions  Medication Sig Dispense Refill  . allopurinol (ZYLOPRIM) 300 MG tablet Take 300 mg by mouth as needed. Take as needed      . alum hydroxide-mag trisilicate (GAVISCON) 80-20 MG CHEW Chew by mouth.      Marland Kitchen aspirin 325 MG EC tablet Take 325 mg by mouth daily.      Marland Kitchen atenolol (TENORMIN) 50 MG tablet Take 1 tablet (50 mg total) by mouth daily.  30 tablet  11  . atorvastatin (LIPITOR) 40 MG tablet Take 40 mg by mouth daily.       . colchicine 0.6 MG tablet Take 1.2-1.8 mg by mouth as needed. Take 2 tablets by mouth at first sign of a gout attack, may take 1 tablet 1 hour later. Repeat daily until attack is resolved       . cyclobenzaprine (FLEXERIL) 5 MG tablet Take 5 mg by mouth 3 (three) times daily as needed. For muscle spasms      . dicyclomine (BENTYL) 10 MG capsule Take 10 mg by mouth 4 (four) times daily -  before meals and at bedtime.      . fluticasone (FLONASE) 50 MCG/ACT nasal spray Place 2 sprays into the nose daily as needed. For congestion      . HYDROcodone-acetaminophen (NORCO) 5-325 MG per tablet Take 1 tablet by mouth every 4 (four) hours as needed. For pain      . hydrOXYzine (VISTARIL) 50 MG capsule Take 50 mg by mouth 3 (three) times  daily as needed for itching.      Marland Kitchen ketoconazole (NIZORAL) 2 % cream As needed      . lisinopril (PRINIVIL,ZESTRIL) 5 MG tablet Take 5 mg by mouth daily.      Marland Kitchen LORazepam (ATIVAN) 1 MG tablet Take 1 mg by mouth every 6 (six) hours as needed. For anxiety      . meloxicam (MOBIC) 15 MG tablet As directed      . omeprazole (PRILOSEC) 10 MG capsule Take 10 mg by mouth daily.      . Pseudoeph-Doxylamine-DM-APAP (NYQUIL MULTI-SYMPTOM PO) Take 30 mLs by mouth every 6 (six) hours as needed. For cough and congestion       . sertraline (ZOLOFT) 25 MG tablet 1 tab daily      . terbinafine (LAMISIL) 250 MG tablet Take 250 mg by mouth as needed.       . Vitamin D, Ergocalciferol, (DRISDOL) 50000 UNITS CAPS Take 1 capsule (50,000 Units total) by mouth every 7 (seven) days. Wednesday  30 capsule  1   No current facility-administered medications for this visit.    Past Medical History  Diagnosis Date  . LAFB (left anterior fascicular block)   . Diverticulitis   . Hypertension   .  Premature ventricular complex   . Atrial fibrillation   . Hyperlipidemia   . Gout   . Anxiety   . Back pain   . Internal hemorrhoids   . GERD (gastroesophageal reflux disease)     Past Surgical History  Procedure Laterality Date  . Nose surgery    . Colon surgery  2009    segmental sigmoid resection  . Colonoscopy      ROS:  As stated in the HPI and negative for all other systems.  PHYSICAL EXAM BP 149/89  Pulse 70  Ht 6\' 1"  (1.854 m)  Wt 263 lb (119.296 kg)  BMI 34.71 kg/m2 GENERAL:  Well appearing HEENT:  Pupils equal round and reactive, fundi not visualized, oral mucosa unremarkable NECK:  No jugular venous distention, waveform within normal limits, carotid upstroke brisk and symmetric, no bruits, no thyromegaly LUNGS:  Clear to auscultation bilaterally BACK:  No CVA tenderness CHEST:  Unremarkable HEART:  PMI not displaced or sustained,S1 and S2 within normal limits, no S3, no S4, no clicks, no rubs,  no murmurs ABD:  Flat, positive bowel sounds normal in frequency in pitch, no bruits, no rebound, no guarding, no midline pulsatile mass, no hepatomegaly, no splenomegaly, obese EXT:  2 plus pulses throughout, no edema, no cyanosis no clubbing SKIN:  No rashes no nodules  EKG:  Sinus rhythm, rate 70, left anterior fascicular block, left axis deviation, first degree AV block, no acute ST-T wave changes.  05/09/2012   ASSESSMENT AND PLAN  Essential hypertension, benign -  His blood pressure is still not at target. I will increase his lisinopril to 5 mg twice daily. I'm going to switch his atenolol to 25 mg twice daily. I encourage continued weight loss.  Atrial fibrillation -  There is no evidence of recurrent atrial fibrillation. No change in therapy is indicated.   OVERWEIGHT -  As above.

## 2012-06-28 ENCOUNTER — Other Ambulatory Visit: Payer: Self-pay | Admitting: Dermatology

## 2012-09-14 ENCOUNTER — Ambulatory Visit (INDEPENDENT_AMBULATORY_CARE_PROVIDER_SITE_OTHER): Payer: Self-pay | Admitting: Family Medicine

## 2012-09-14 ENCOUNTER — Encounter: Payer: Self-pay | Admitting: Family Medicine

## 2012-09-14 VITALS — BP 140/87 | HR 72 | Temp 98.3°F | Ht 73.0 in | Wt 262.1 lb

## 2012-09-14 DIAGNOSIS — C4491 Basal cell carcinoma of skin, unspecified: Secondary | ICD-10-CM | POA: Insufficient documentation

## 2012-09-14 DIAGNOSIS — I10 Essential (primary) hypertension: Secondary | ICD-10-CM

## 2012-09-14 DIAGNOSIS — M47817 Spondylosis without myelopathy or radiculopathy, lumbosacral region: Secondary | ICD-10-CM

## 2012-09-14 DIAGNOSIS — I4891 Unspecified atrial fibrillation: Secondary | ICD-10-CM

## 2012-09-14 DIAGNOSIS — Z23 Encounter for immunization: Secondary | ICD-10-CM

## 2012-09-14 DIAGNOSIS — R350 Frequency of micturition: Secondary | ICD-10-CM

## 2012-09-14 DIAGNOSIS — E663 Overweight: Secondary | ICD-10-CM

## 2012-09-14 DIAGNOSIS — K219 Gastro-esophageal reflux disease without esophagitis: Secondary | ICD-10-CM

## 2012-09-14 DIAGNOSIS — K5790 Diverticulosis of intestine, part unspecified, without perforation or abscess without bleeding: Secondary | ICD-10-CM | POA: Insufficient documentation

## 2012-09-14 DIAGNOSIS — R52 Pain, unspecified: Secondary | ICD-10-CM

## 2012-09-14 DIAGNOSIS — F411 Generalized anxiety disorder: Secondary | ICD-10-CM

## 2012-09-14 DIAGNOSIS — K6289 Other specified diseases of anus and rectum: Secondary | ICD-10-CM

## 2012-09-14 DIAGNOSIS — E785 Hyperlipidemia, unspecified: Secondary | ICD-10-CM

## 2012-09-14 DIAGNOSIS — K7689 Other specified diseases of liver: Secondary | ICD-10-CM

## 2012-09-14 LAB — CBC
HCT: 43.8 % (ref 39.0–52.0)
Hemoglobin: 14.9 g/dL (ref 13.0–17.0)
MCH: 31 pg (ref 26.0–34.0)
MCHC: 34 g/dL (ref 30.0–36.0)

## 2012-09-14 LAB — RENAL FUNCTION PANEL
Albumin: 4.2 g/dL (ref 3.5–5.2)
BUN: 12 mg/dL (ref 6–23)
CO2: 28 mEq/L (ref 19–32)
Calcium: 10.2 mg/dL (ref 8.4–10.5)
Glucose, Bld: 96 mg/dL (ref 70–99)

## 2012-09-14 LAB — HEPATIC FUNCTION PANEL
ALT: 31 U/L (ref 0–53)
AST: 25 U/L (ref 0–37)
Indirect Bilirubin: 0.6 mg/dL (ref 0.0–0.9)
Total Protein: 6.5 g/dL (ref 6.0–8.3)

## 2012-09-14 LAB — LIPID PANEL
Cholesterol: 139 mg/dL (ref 0–200)
VLDL: 19 mg/dL (ref 0–40)

## 2012-09-14 MED ORDER — HYDROCODONE-ACETAMINOPHEN 5-325 MG PO TABS: 1.0000 | ORAL_TABLET | Freq: Four times a day (QID) | ORAL | Status: DC | PRN

## 2012-09-14 NOTE — Patient Instructions (Addendum)
Digestive Advantage or Align daily 4 oz warm prune juice and 2 tbls Milk of Mag as needed for constipation, can add a Dulcolax at the suppository   Preventive Care for Adults, Male A healthy lifestyle and preventive care can promote health and wellness. Preventive health guidelines for men include the following key practices:  A routine yearly physical is a good way to check with your caregiver about your health and preventative screening. It is a chance to share any concerns and updates on your health, and to receive a thorough exam.  Visit your dentist for a routine exam and preventative care every 6 months. Brush your teeth twice a day and floss once a day. Good oral hygiene prevents tooth decay and gum disease.  The frequency of eye exams is based on your age, health, family medical history, use of contact lenses, and other factors. Follow your caregiver's recommendations for frequency of eye exams.  Eat a healthy diet. Foods like vegetables, fruits, whole grains, low-fat dairy products, and lean protein foods contain the nutrients you need without too many calories. Decrease your intake of foods high in solid fats, added sugars, and salt. Eat the right amount of calories for you.Get information about a proper diet from your caregiver, if necessary.  Regular physical exercise is one of the most important things you can do for your health. Most adults should get at least 150 minutes of moderate-intensity exercise (any activity that increases your heart rate and causes you to sweat) each week. In addition, most adults need muscle-strengthening exercises on 2 or more days a week.  Maintain a healthy weight. The body mass index (BMI) is a screening tool to identify possible weight problems. It provides an estimate of body fat based on height and weight. Your caregiver can help determine your BMI, and can help you achieve or maintain a healthy weight.For adults 20 years and older:  A BMI below  18.5 is considered underweight.  A BMI of 18.5 to 24.9 is normal.  A BMI of 25 to 29.9 is considered overweight.  A BMI of 30 and above is considered obese.  Maintain normal blood lipids and cholesterol levels by exercising and minimizing your intake of saturated fat. Eat a balanced diet with plenty of fruit and vegetables. Blood tests for lipids and cholesterol should begin at age 19 and be repeated every 5 years. If your lipid or cholesterol levels are high, you are over 50, or you are a high risk for heart disease, you may need your cholesterol levels checked more frequently.Ongoing high lipid and cholesterol levels should be treated with medicines if diet and exercise are not effective.  If you smoke, find out from your caregiver how to quit. If you do not use tobacco, do not start.  If you choose to drink alcohol, do not exceed 2 drinks per day. One drink is considered to be 12 ounces (355 mL) of beer, 5 ounces (148 mL) of wine, or 1.5 ounces (44 mL) of liquor.  Avoid use of street drugs. Do not share needles with anyone. Ask for help if you need support or instructions about stopping the use of drugs.  High blood pressure causes heart disease and increases the risk of stroke. Your blood pressure should be checked at least every 1 to 2 years. Ongoing high blood pressure should be treated with medicines, if weight loss and exercise are not effective.  If you are 98 to 67 years old, ask your caregiver if you should  take aspirin to prevent heart disease.  Diabetes screening involves taking a blood sample to check your fasting blood sugar level. This should be done once every 3 years, after age 18, if you are within normal weight and without risk factors for diabetes. Testing should be considered at a younger age or be carried out more frequently if you are overweight and have at least 1 risk factor for diabetes.  Colorectal cancer can be detected and often prevented. Most routine colorectal  cancer screening begins at the age of 75 and continues through age 30. However, your caregiver may recommend screening at an earlier age if you have risk factors for colon cancer. On a yearly basis, your caregiver may provide home test kits to check for hidden blood in the stool. Use of a small camera at the end of a tube, to directly examine the colon (sigmoidoscopy or colonoscopy), can detect the earliest forms of colorectal cancer. Talk to your caregiver about this at age 28, when routine screening begins. Direct examination of the colon should be repeated every 5 to 10 years through age 2, unless early forms of pre-cancerous polyps or small growths are found.  Hepatitis C blood testing is recommended for all people born from [birth year] through 1965 and any individual with known risks for hepatitis C.  Practice safe sex. Use condoms and avoid high-risk sexual practices to reduce the spread of sexually transmitted infections (STIs). STIs include gonorrhea, chlamydia, syphilis, trichomonas, herpes, HPV, and human immunodeficiency virus (HIV). Herpes, HIV, and HPV are viral illnesses that have no cure. They can result in disability, cancer, and death.  A one-time screening for abdominal aortic aneurysm (AAA) and surgical repair of large AAAs by sound wave imaging (ultrasonography) is recommended for ages 31 to 72 years who are current or former smokers.  Healthy men should no longer receive prostate-specific antigen (PSA) blood tests as part of routine cancer screening. Consult with your caregiver about prostate cancer screening.  Testicular cancer screening is not recommended for adult males who have no symptoms. Screening includes self-exam, caregiver exam, and other screening tests. Consult with your caregiver about any symptoms you have or any concerns you have about testicular cancer.  Use sunscreen with skin protection factor (SPF) of 30 or more. Apply sunscreen liberally and repeatedly throughout the  day. You should seek shade when your shadow is shorter than you. Protect yourself by wearing long sleeves, pants, a wide-brimmed hat, and sunglasses year round, whenever you are outdoors.  Once a month, do a whole body skin exam, using a mirror to look at the skin on your back. Notify your caregiver of new moles, moles that have irregular borders, moles that are larger than a pencil eraser, or moles that have changed in shape or color.  Stay current with required immunizations.  Influenza. You need a dose every fall (or winter). The composition of the flu vaccine changes each year, so being vaccinated once is not enough.  Pneumococcal polysaccharide. You need 1 to 2 doses if you smoke cigarettes or if you have certain chronic medical conditions. You need 1 dose at age 40 (or older) if you have never been vaccinated.  Tetanus, diphtheria, pertussis (Tdap, Td). Get 1 dose of Tdap vaccine if you are younger than age 94 years, are over 74 and have contact with an infant, are a Research scientist (physical sciences), or simply want to be protected from whooping cough. After that, you need a Td booster dose every 10 years. Consult your caregiver  if you have not had at least 3 tetanus and diphtheria-containing shots sometime in your life or have a deep or dirty wound.  HPV. This vaccine is recommended for males 13 through 67 years of age. This vaccine may be given to men 22 through 67 years of age who have not completed the 3 dose series. It is recommended for men through age 50 who have sex with men or whose immune system is weakened because of HIV infection, other illness, or medications. The vaccine is given in 3 doses over 6 months.  Measles, mumps, rubella (MMR). You need at least 1 dose of MMR if you were born in 1957 or later. You may also need a 2nd dose.  Meningococcal. If you are age 55 to 49 years and a Orthoptist living in a residence hall, or have one of several medical conditions, you need to get  vaccinated against meningococcal disease. You may also need additional booster doses.  Zoster (shingles). If you are age 80 years or older, you should get this vaccine.  Varicella (chickenpox). If you have never had chickenpox or you were vaccinated but received only 1 dose, talk to your caregiver to find out if you need this vaccine.  Hepatitis A. You need this vaccine if you have a specific risk factor for hepatitis A virus infection, or you simply wish to be protected from this disease. The vaccine is usually given as 2 doses, 6 to 18 months apart.  Hepatitis B. You need this vaccine if you have a specific risk factor for hepatitis B virus infection or you simply wish to be protected from this disease. The vaccine is given in 3 doses, usually over 6 months. Preventative Service / Frequency Ages 35 to 48  Blood pressure check.** / Every 1 to 2 years.  Lipid and cholesterol check.** / Every 5 years beginning at age 55.  Hepatitis C blood test.** / For any individual with known risks for hepatitis C.  Skin self-exam. / Monthly.  Influenza immunization.** / Every year.  Pneumococcal polysaccharide immunization.** / 1 to 2 doses if you smoke cigarettes or if you have certain chronic medical conditions.  Tetanus, diphtheria, pertussis (Tdap,Td) immunization. / A one-time dose of Tdap vaccine. After that, you need a Td booster dose every 10 years.  HPV immunization. / 3 doses over 6 months, if 26 and younger.  Measles, mumps, rubella (MMR) immunization. / You need at least 1 dose of MMR if you were born in 1957 or later. You may also need a 2nd dose.  Meningococcal immunization. / 1 dose if you are age 87 to 74 years and a Orthoptist living in a residence hall, or have one of several medical conditions, you need to get vaccinated against meningococcal disease. You may also need additional booster doses.  Varicella immunization.** / Consult your caregiver.  Hepatitis A  immunization.** / Consult your caregiver. 2 doses, 6 to 18 months apart.  Hepatitis B immunization.** / Consult your caregiver. 3 doses usually over 6 months. Ages 16 to 65  Blood pressure check.** / Every 1 to 2 years.  Lipid and cholesterol check.** / Every 5 years beginning at age 38.  Fecal occult blood test (FOBT) of stool. / Every year beginning at age 66 and continuing until age 79. You may not have to do this test if you get colonoscopy every 10 years.  Flexible sigmoidoscopy** or colonoscopy.** / Every 5 years for a flexible sigmoidoscopy or every 10  years for a colonoscopy beginning at age 45 and continuing until age 79.  Hepatitis C blood test.** / For all people born from [birth year] through 1965 and any individual with known risks for hepatitis C.  Skin self-exam. / Monthly.  Influenza immunization.** / Every year.  Pneumococcal polysaccharide immunization.** / 1 to 2 doses if you smoke cigarettes or if you have certain chronic medical conditions.  Tetanus, diphtheria, pertussis (Tdap/Td) immunization.** / A one-time dose of Tdap vaccine. After that, you need a Td booster dose every 10 years.  Measles, mumps, rubella (MMR) immunization. / You need at least 1 dose of MMR if you were born in 1957 or later. You may also need a 2nd dose.  Varicella immunization.**/ Consult your caregiver.  Meningococcal immunization.** / Consult your caregiver.  Hepatitis A immunization.** / Consult your caregiver. 2 doses, 6 to 18 months apart.  Hepatitis B immunization.** / Consult your caregiver. 3 doses, usually over 6 months. Ages 34 and over  Blood pressure check.** / Every 1 to 2 years.  Lipid and cholesterol check.**/ Every 5 years beginning at age 76.  Fecal occult blood test (FOBT) of stool. / Every year beginning at age 58 and continuing until age 54. You may not have to do this test if you get colonoscopy every 10 years.  Flexible sigmoidoscopy** or colonoscopy.** / Every 5  years for a flexible sigmoidoscopy or every 10 years for a colonoscopy beginning at age 50 and continuing until age 27.  Hepatitis C blood test.** / For all people born from [birth year] through 1965 and any individual with known risks for hepatitis C.  Abdominal aortic aneurysm (AAA) screening.** / A one-time screening for ages 66 to 78 years who are current or former smokers.  Skin self-exam. / Monthly.  Influenza immunization.** / Every year.  Pneumococcal polysaccharide immunization.** / 1 dose at age 32 (or older) if you have never been vaccinated.  Tetanus, diphtheria, pertussis (Tdap, Td) immunization. / A one-time dose of Tdap vaccine if you are over 65 and have contact with an infant, are a Research scientist (physical sciences), or simply want to be protected from whooping cough. After that, you need a Td booster dose every 10 years.  Varicella immunization. ** / Consult your caregiver.  Meningococcal immunization.** / Consult your caregiver.  Hepatitis A immunization. ** / Consult your caregiver. 2 doses, 6 to 18 months apart.  Hepatitis B immunization.** / Check with your caregiver. 3 doses, usually over 6 months. **Family history and personal history of risk and conditions may change your caregiver's recommendations. Document Released: 02/24/2001 Document Revised: 03/23/2011 Document Reviewed: 05/26/2010 [facility] Patient Information 2014 [place], [place].

## 2012-09-15 LAB — TSH: TSH: 2.9 u[IU]/mL (ref 0.350–4.500)

## 2012-09-15 LAB — PSA: PSA: 1.57 ng/mL (ref <=4.00)

## 2012-09-18 ENCOUNTER — Encounter: Payer: Self-pay | Admitting: Family Medicine

## 2012-09-18 DIAGNOSIS — K6289 Other specified diseases of anus and rectum: Secondary | ICD-10-CM | POA: Insufficient documentation

## 2012-09-18 NOTE — Progress Notes (Signed)
Patient ID: David [name], male   DOB: 1945/12/01, 67 y.o.   MRN: [id number] [name] [id number] 09-10-1945 09/18/2012      Progress Note New Patient  Subjective  Chief Complaint  Chief Complaint  Patient presents with  . Establish Care    new patient    HPI  Patient is a 67 year old Caucasian male in today for new patient appointment. Complicated past medical history but is generally doing well at this time. Has a very serious history of diverticulitis requiring 18-gauge bowel resection in the past but has been stable for quite some time. Does have intermittent rectal irritation especially after an episode of constipation. Has intermittent intratracheal yeast infections as well. No recent illness. Does have a history of atrial fibrillation follows with cardiology. Has a history of basal cell carcinoma falls with dermatology. Has a long history of significant nasal congestion and did have nasal surgery many years ago. No chest pain, palpitations, shortness of breath, GI or GU complaints at this time  Past Medical History  Diagnosis Date  . LAFB (left anterior fascicular block)   . Diverticulitis   . Hypertension   . Premature ventricular complex   . Atrial fibrillation   . Hyperlipidemia   . Gout   . Anxiety   . Back pain   . Internal hemorrhoids   . GERD (gastroesophageal reflux disease)   . Diverticulosis   . BCC (basal cell carcinoma of skin)     under right eye and right ear    Past Surgical History  Procedure Laterality Date  . Nose surgery    . Colonoscopy    . Colon surgery  2009    segmental sigmoid resection  . Skin biopsy      Family History  Problem Relation Age of Onset  . Heart disease Mother   . Cancer Mother     colon, breast  . Hypertension Mother   . Stroke Mother   . Heart disease Father     pacemaker  . Aortic aneurysm Father   . Hypertension Father   . Heart disease Sister   . Atrial fibrillation Sister   . Obesity Sister   .  Sleep apnea Sister   . Heart attack Brother   . Other Brother     muscle disease  . Arthritis Brother   . Cancer Maternal Grandmother     ?  Marland Kitchen Heart attack Maternal Grandmother   . Diabetes Maternal Grandmother   . Cancer Maternal Grandfather     hodgin's lymphoma  . Heart attack Paternal Grandmother   . Anxiety disorder Paternal Grandmother   . Pneumonia Paternal Grandfather   . Heart attack Brother   . Atrial fibrillation Brother   . Heart attack Brother   . Hypertension Brother   . Hyperlipidemia Brother   . Heart attack Brother   . Other Brother     heart valve operation    History   Social History  . Marital Status: Married    Spouse Name: N/A    Number of Children: 3  . Years of Education: N/A   Occupational History  . [employer]    Social History Main Topics  . Smoking status: Former Smoker -- 1.00 packs/day for 6 years    Types: Cigarettes    Start date: 01/12/1969  . Smokeless tobacco: Never Used  . Alcohol Use: Yes     Comment: 2 drinks every night  . Drug Use: No  . Sexual Activity: Yes  Other Topics Concern  . Not on file   Social History Narrative  . No narrative on file    Current Outpatient Prescriptions on File Prior to Visit  Medication Sig Dispense Refill  . allopurinol (ZYLOPRIM) 300 MG tablet Take 300 mg by mouth as needed. Take as needed      . alum hydroxide-mag trisilicate (GAVISCON) 80-20 MG CHEW Chew by mouth.      Marland Kitchen atenolol (TENORMIN) 25 MG tablet Take 1 tablet (25 mg total) by mouth 2 (two) times daily.  60 tablet  11  . atorvastatin (LIPITOR) 40 MG tablet Take 40 mg by mouth daily.       . colchicine 0.6 MG tablet Take 1.2-1.8 mg by mouth as needed. Take 2 tablets by mouth at first sign of a gout attack, may take 1 tablet 1 hour later. Repeat daily until attack is resolved       . cyclobenzaprine (FLEXERIL) 5 MG tablet Take 5 mg by mouth 3 (three) times daily as needed. For muscle spasms      . fluticasone (FLONASE) 50 MCG/ACT nasal  spray Place 2 sprays into the nose daily as needed. For congestion      . ketoconazole (NIZORAL) 2 % cream As needed      . lisinopril (PRINIVIL,ZESTRIL) 5 MG tablet Take 1 tablet (5 mg total) by mouth 2 (two) times daily.  60 tablet  11  . LORazepam (ATIVAN) 1 MG tablet Take 1 mg by mouth every 6 (six) hours as needed. For anxiety      . meloxicam (MOBIC) 15 MG tablet As directed      . omeprazole (PRILOSEC) 10 MG capsule Take 10 mg by mouth daily.      . Pseudoeph-Doxylamine-DM-APAP (NYQUIL MULTI-SYMPTOM PO) Take 30 mLs by mouth every 6 (six) hours as needed. For cough and congestion       . sertraline (ZOLOFT) 25 MG tablet 1 tab daily      . terbinafine (LAMISIL) 250 MG tablet Take 250 mg by mouth as needed.        No current facility-administered medications on file prior to visit.    Allergies  Allergen Reactions  . Cefaclor Hives  . Cephalosporins Hives    Review of Systems  Review of Systems  Constitutional: Negative for fever, chills and malaise/fatigue.  HENT: Positive for congestion. Negative for hearing loss and nosebleeds.   Eyes: Negative for discharge.  Respiratory: Negative for cough, sputum production, shortness of breath and wheezing.   Cardiovascular: Negative for chest pain, palpitations and leg swelling.  Gastrointestinal: Positive for constipation. Negative for heartburn, nausea, vomiting, abdominal pain, diarrhea and blood in stool.  Genitourinary: Negative for dysuria, urgency, frequency and hematuria.  Musculoskeletal: Negative for myalgias, back pain and falls.  Skin: Positive for itching and rash.  Neurological: Negative for dizziness, tremors, sensory change, focal weakness, loss of consciousness, weakness and headaches.  Endo/Heme/Allergies: Negative for polydipsia. Does not bruise/bleed easily.  Psychiatric/Behavioral: Negative for depression and suicidal ideas. The patient is not nervous/anxious and does not have insomnia.     Objective  BP 140/87   Pulse 72  Temp(Src) 98.3 F (36.8 C) (Oral)  Ht 6\' 1"  (1.854 m)  Wt 262 lb 1.9 oz (118.897 kg)  BMI 34.59 kg/m2  SpO2 97%  Physical Exam  Physical Exam  Constitutional: He is oriented to person, place, and time and well-developed, well-nourished, and in no distress. No distress.  HENT:  Head: Normocephalic and atraumatic.  Eyes: Conjunctivae  are normal.  Neck: Neck supple. No thyromegaly present.  Cardiovascular: Normal rate, regular rhythm and normal heart sounds.   No murmur heard. Pulmonary/Chest: Effort normal and breath sounds normal. No respiratory distress.  Abdominal: He exhibits no distension and no mass. There is no tenderness.  Musculoskeletal: He exhibits no edema.  Neurological: He is alert and oriented to person, place, and time.  Skin: Skin is warm.  Psychiatric: Memory, affect and judgment normal.       Assessment & Plan  Essential hypertension, benign Well controlled no changes to meds.   GERD Avoid offending foods, add a probiotic daily and continue Prilosec.  FATTY LIVER DISEASE Will monitor LFTs, minimize simple carbs and trans fats.   HYPERLIPIDEMIA Avoid trans fats, add a krill oil cap daily and continue Lipitor.  ANXIETY Stable on current meds. No changes.  OVERWEIGHT Minimize simple carbs and saturated fats, maintain exercise.  BCC (basal cell carcinoma of skin) No new concerning lesions  Rectal irritation Encouraged treatment of low dose constipation with probiotics and cleanse rectal area with Witch Hazel Astringent. Use suppositories prn and report worsening symptoms.

## 2012-09-18 NOTE — Assessment & Plan Note (Signed)
Minimize simple carbs and saturated fats, maintain exercise.

## 2012-09-18 NOTE — Assessment & Plan Note (Signed)
No new concerning lesions

## 2012-09-18 NOTE — Assessment & Plan Note (Signed)
Encouraged treatment of low dose constipation with probiotics and cleanse rectal area with [facility] Astringent. Use suppositories prn and report worsening symptoms.

## 2012-09-18 NOTE — Assessment & Plan Note (Signed)
Avoid offending foods, add a probiotic daily and continue Prilosec.

## 2012-09-18 NOTE — Assessment & Plan Note (Signed)
Stable on current meds. No changes 

## 2012-09-18 NOTE — Assessment & Plan Note (Signed)
Will monitor LFTs, minimize simple carbs and trans fats.

## 2012-09-18 NOTE — Assessment & Plan Note (Signed)
Well controlled no changes to meds 

## 2012-09-18 NOTE — Assessment & Plan Note (Signed)
Avoid trans fats, add a krill oil cap daily and continue Lipitor.

## 2012-11-17 ENCOUNTER — Encounter (INDEPENDENT_AMBULATORY_CARE_PROVIDER_SITE_OTHER): Payer: Self-pay

## 2012-11-17 ENCOUNTER — Encounter: Payer: Self-pay | Admitting: Cardiology

## 2012-11-17 ENCOUNTER — Ambulatory Visit (INDEPENDENT_AMBULATORY_CARE_PROVIDER_SITE_OTHER): Payer: BC Managed Care – HMO | Admitting: Cardiology

## 2012-11-17 VITALS — BP 130/90 | HR 60 | Ht 73.0 in | Wt 267.0 lb

## 2012-11-17 DIAGNOSIS — I4891 Unspecified atrial fibrillation: Secondary | ICD-10-CM

## 2012-11-17 DIAGNOSIS — I10 Essential (primary) hypertension: Secondary | ICD-10-CM

## 2012-11-17 NOTE — Progress Notes (Signed)
HPI The patient presents for followup of a history of atrial fibrillation and HTN. He has had No symptoms since he was last seen. He's not feeling any palpitations. He denies any chest discomfort. He has some arm discomfort but this is associated with certain positions and goes away actually with activity. He denies any neck discomfort. He's had no shortness of breath, PND or orthopnea. He has not been participating in risk reduction as I would like.    Allergies  Allergen Reactions  . Cefaclor Hives  . Cephalosporins Hives    Current Outpatient Prescriptions  Medication Sig Dispense Refill  . allopurinol (ZYLOPRIM) 300 MG tablet Take 300 mg by mouth as needed. Take as needed      . alum hydroxide-mag trisilicate (GAVISCON) 80-20 MG CHEW Chew by mouth.      Marland Kitchen aspirin 81 MG tablet Take 81 mg by mouth daily.      Marland Kitchen atenolol (TENORMIN) 25 MG tablet Take 1 tablet (25 mg total) by mouth 2 (two) times daily.  60 tablet  11  . atorvastatin (LIPITOR) 40 MG tablet Take 40 mg by mouth daily.       . colchicine 0.6 MG tablet Take 1.2-1.8 mg by mouth as needed. Take 2 tablets by mouth at first sign of a gout attack, may take 1 tablet 1 hour later. Repeat daily until attack is resolved       . cyclobenzaprine (FLEXERIL) 5 MG tablet Take 5 mg by mouth 3 (three) times daily as needed. For muscle spasms      . fluticasone (FLONASE) 50 MCG/ACT nasal spray Place 2 sprays into the nose daily as needed. For congestion      . HYDROcodone-acetaminophen (NORCO/VICODIN) 5-325 MG per tablet Take 1 tablet by mouth every 6 (six) hours as needed for pain.  40 tablet  1  . ketoconazole (NIZORAL) 2 % cream As needed      . lisinopril (PRINIVIL,ZESTRIL) 5 MG tablet Take 1 tablet (5 mg total) by mouth 2 (two) times daily.  60 tablet  11  . LORazepam (ATIVAN) 1 MG tablet Take 1 mg by mouth every 6 (six) hours as needed. For anxiety      . meloxicam (MOBIC) 15 MG tablet As directed      . NASONEX 50 MCG/ACT nasal spray        . omeprazole (PRILOSEC) 10 MG capsule Take 10 mg by mouth daily.      Marland Kitchen PROCTOZONE-HC 2.5 % rectal cream       . Pseudoeph-Doxylamine-DM-APAP (NYQUIL MULTI-SYMPTOM PO) Take 30 mLs by mouth every 6 (six) hours as needed. For cough and congestion       . sertraline (ZOLOFT) 25 MG tablet 1 tab daily      . terbinafine (LAMISIL) 250 MG tablet Take 250 mg by mouth as needed.        No current facility-administered medications for this visit.    Past Medical History  Diagnosis Date  . LAFB (left anterior fascicular block)   . Diverticulitis   . Hypertension   . Premature ventricular complex   . Atrial fibrillation   . Hyperlipidemia   . Gout   . Anxiety   . Back pain   . Internal hemorrhoids   . GERD (gastroesophageal reflux disease)   . Diverticulosis   . BCC (basal cell carcinoma of skin)     under right eye and right ear  . Rectal irritation 09/18/2012    Past Surgical History  Procedure  Laterality Date  . Nose surgery    . Colonoscopy    . Colon surgery  2009    segmental sigmoid resection  . Skin biopsy      ROS:  As stated in the HPI and negative for all other systems.  PHYSICAL EXAM Pulse 60  Ht 6\' 1"  (1.854 m)  Wt 267 lb (121.11 kg)  BMI 35.23 kg/m2 GENERAL:  Well appearing NECK:  No jugular venous distention, waveform within normal limits, carotid upstroke brisk and symmetric, no bruits, no thyromegaly LUNGS:  Clear to auscultation bilaterally BACK:  No CVA tenderness CHEST:  Unremarkable HEART:  PMI not displaced or sustained,S1 and S2 within normal limits, no S3, no S4, no clicks, no rubs, no murmurs ABD:  Flat, positive bowel sounds normal in frequency in pitch, no bruits, no rebound, no guarding, no midline pulsatile mass, no hepatomegaly, no splenomegaly, obese EXT:  2 plus pulses throughout, no edema, no cyanosis no clubbing   EKG:  Sinus rhythm, rate 60, left anterior fascicular block, left axis deviation, first degree AV block, no acute ST-T wave  changes.  11/17/2012   ASSESSMENT AND PLAN  Essential hypertension, benign -  The blood pressure is at target. No change in medications is indicated. We will continue with therapeutic lifestyle changes (TLC).   Atrial fibrillation -  There is no evidence of recurrent atrial fibrillation. No change in therapy is indicated.   OVERWEIGHT -  He has not been compliant with lifestyle changes and we have discussed this at length in the past.

## 2012-11-17 NOTE — Patient Instructions (Signed)
Your physician wants you to follow-up in: 12 months.  You will receive a reminder letter in the mail two months in advance. If you don't receive a letter, please call our office to schedule the follow-up appointment.  Your physician recommends that you continue on your current medications as directed. Please refer to the Current Medication list given to you today.   

## 2012-11-30 ENCOUNTER — Other Ambulatory Visit: Payer: Self-pay | Admitting: *Deleted

## 2012-11-30 MED ORDER — COLCHICINE 0.6 MG PO TABS: ORAL_TABLET | ORAL | Status: DC

## 2012-11-30 NOTE — Telephone Encounter (Signed)
Patient left msg on phone stating that he cannot get response on request for Colchicine refill; Rx to pharmacy/[facility]

## 2012-12-02 ENCOUNTER — Telehealth: Payer: Self-pay | Admitting: Family Medicine

## 2012-12-02 MED ORDER — ALLOPURINOL 300 MG PO TABS: 300.0000 mg | ORAL_TABLET | Freq: Every day | ORAL | Status: DC | PRN

## 2012-12-02 NOTE — Telephone Encounter (Signed)
Refill allopurinol 300 mg tablets take 1 tablet by mouth every day qty 30 last fill 06-29-11

## 2012-12-02 NOTE — Telephone Encounter (Signed)
Rx request to pharmacy/[facility]  

## 2012-12-12 ENCOUNTER — Encounter: Payer: Self-pay | Admitting: Family Medicine

## 2012-12-12 ENCOUNTER — Ambulatory Visit (INDEPENDENT_AMBULATORY_CARE_PROVIDER_SITE_OTHER): Payer: Medicare Other | Admitting: Family Medicine

## 2012-12-12 VITALS — BP 142/84 | HR 65 | Temp 98.1°F | Ht 73.0 in | Wt 262.1 lb

## 2012-12-12 DIAGNOSIS — M109 Gout, unspecified: Secondary | ICD-10-CM

## 2012-12-12 DIAGNOSIS — K5790 Diverticulosis of intestine, part unspecified, without perforation or abscess without bleeding: Secondary | ICD-10-CM

## 2012-12-12 DIAGNOSIS — N289 Disorder of kidney and ureter, unspecified: Secondary | ICD-10-CM

## 2012-12-12 DIAGNOSIS — K5730 Diverticulosis of large intestine without perforation or abscess without bleeding: Secondary | ICD-10-CM

## 2012-12-12 DIAGNOSIS — E663 Overweight: Secondary | ICD-10-CM

## 2012-12-12 DIAGNOSIS — Z23 Encounter for immunization: Secondary | ICD-10-CM

## 2012-12-12 DIAGNOSIS — I10 Essential (primary) hypertension: Secondary | ICD-10-CM

## 2012-12-12 DIAGNOSIS — E785 Hyperlipidemia, unspecified: Secondary | ICD-10-CM

## 2012-12-12 DIAGNOSIS — F329 Major depressive disorder, single episode, unspecified: Secondary | ICD-10-CM

## 2012-12-12 DIAGNOSIS — F341 Dysthymic disorder: Secondary | ICD-10-CM

## 2012-12-12 DIAGNOSIS — R197 Diarrhea, unspecified: Secondary | ICD-10-CM

## 2012-12-12 DIAGNOSIS — F32A Depression, unspecified: Secondary | ICD-10-CM

## 2012-12-12 LAB — RENAL FUNCTION PANEL
Albumin: 4.2 g/dL (ref 3.5–5.2)
BUN: 12 mg/dL (ref 6–23)
Chloride: 105 mEq/L (ref 96–112)
Creat: 1.29 mg/dL (ref 0.50–1.35)
Glucose, Bld: 98 mg/dL (ref 70–99)
Phosphorus: 3.1 mg/dL (ref 2.3–4.6)

## 2012-12-12 LAB — HEPATIC FUNCTION PANEL
AST: 36 U/L (ref 0–37)
Bilirubin, Direct: 0.1 mg/dL (ref 0.0–0.3)
Indirect Bilirubin: 0.6 mg/dL (ref 0.0–0.9)
Total Bilirubin: 0.7 mg/dL (ref 0.3–1.2)

## 2012-12-12 LAB — LIPID PANEL
Total CHOL/HDL Ratio: 4.8 Ratio
VLDL: 28 mg/dL (ref 0–40)

## 2012-12-12 MED ORDER — SERTRALINE HCL 25 MG PO TABS: 25.0000 mg | ORAL_TABLET | Freq: Two times a day (BID) | ORAL | Status: DC

## 2012-12-12 MED ORDER — ZOSTER VACCINE LIVE 19400 UNT/0.65ML ~~LOC~~ SOLR: 0.6500 mL | Freq: Once | SUBCUTANEOUS | Status: DC

## 2012-12-12 MED ORDER — LORAZEPAM 1 MG PO TABS: 1.0000 mg | ORAL_TABLET | Freq: Three times a day (TID) | ORAL | Status: DC | PRN

## 2012-12-12 MED ORDER — COLCHICINE 0.6 MG PO TABS: ORAL_TABLET | ORAL | Status: DC

## 2012-12-12 NOTE — Progress Notes (Signed)
Patient ID: David [name], male   DOB: 11/20/1945, 67 y.o.   MRN: [id number] David [name] [id number] 1945-11-04 12/12/2012      Progress Note-Follow Up  Subjective  Chief Complaint  Chief Complaint  Patient presents with  . Follow-up    3 month    HPI   patient is a 67 year old male in today for followup. Has had a recent flare of pain in his left great toe. 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 It is nearly resolved. Diarrhea is coming down as well. No bloody return stool. Has decreased his beer and red meat intake and that seemed to help his gout as well.  Past Medical History  Diagnosis Date  . LAFB (left anterior fascicular block)   . Diverticulitis   . Hypertension   . Premature ventricular complex   . Atrial fibrillation   . Hyperlipidemia   . Gout   . Anxiety   . Back pain   . Internal hemorrhoids   . GERD (gastroesophageal reflux disease)   . Diverticulosis   . BCC (basal cell carcinoma of skin)     under right eye and right ear  . Rectal irritation 09/18/2012    Past Surgical History  Procedure Laterality Date  . Nose surgery    . Colonoscopy    . Colon surgery  2009    segmental sigmoid resection  . Skin biopsy      Family History  Problem Relation Age of Onset  . Heart disease Mother   . Cancer Mother     colon, breast  . Hypertension Mother   . Stroke Mother   . Heart disease Father     pacemaker  . Aortic aneurysm Father   . Hypertension Father   . Heart disease Sister   . Atrial fibrillation Sister   . Obesity Sister   . Sleep apnea Sister   . Heart attack Brother   . Other Brother     muscle disease  . Arthritis Brother   . Cancer Maternal Grandmother     ?  Marland Kitchen Heart attack Maternal Grandmother   . Diabetes Maternal Grandmother   . Cancer Maternal Grandfather     hodgin's lymphoma  . Heart attack Paternal Grandmother   . Anxiety disorder Paternal Grandmother   . Pneumonia Paternal  Grandfather   . Heart attack Brother   . Atrial fibrillation Brother   . Heart attack Brother   . Hypertension Brother   . Hyperlipidemia Brother   . Heart attack Brother   . Other Brother     heart valve operation    History   Social History  . Marital Status: Married    Spouse Name: N/A    Number of Children: 3  . Years of Education: N/A   Occupational History  . [employer]    Social History Main Topics  . Smoking status: Former Smoker -- 1.00 packs/day for 6 years    Types: Cigarettes    Start date: 01/12/1969  . Smokeless tobacco: Never Used  . Alcohol Use: Yes     Comment: 2 drinks every night  . Drug Use: No  . Sexual Activity: Yes   Other Topics Concern  . Not on file   Social History Narrative  . No narrative on file    Current Outpatient Prescriptions on File Prior to Visit  Medication Sig Dispense Refill  . allopurinol (ZYLOPRIM) 300 MG tablet Take 1 tablet (300 mg total)  by mouth daily as needed. Take as needed  30 tablet  1  . alum hydroxide-mag trisilicate (GAVISCON) 80-20 MG CHEW Chew by mouth.      Marland Kitchen aspirin 81 MG tablet Take 81 mg by mouth daily.      Marland Kitchen atenolol (TENORMIN) 25 MG tablet Take 1 tablet (25 mg total) by mouth 2 (two) times daily.  60 tablet  11  . atorvastatin (LIPITOR) 40 MG tablet Take 40 mg by mouth daily.       . cyclobenzaprine (FLEXERIL) 5 MG tablet Take 5 mg by mouth 3 (three) times daily as needed. For muscle spasms      . fluticasone (FLONASE) 50 MCG/ACT nasal spray Place 2 sprays into the nose daily as needed. For congestion      . HYDROcodone-acetaminophen (NORCO/VICODIN) 5-325 MG per tablet Take 1 tablet by mouth every 6 (six) hours as needed for pain.  40 tablet  1  . ketoconazole (NIZORAL) 2 % cream As needed      . lisinopril (PRINIVIL,ZESTRIL) 5 MG tablet Take 1 tablet (5 mg total) by mouth 2 (two) times daily.  60 tablet  11  . meloxicam (MOBIC) 15 MG tablet As directed      . NASONEX 50 MCG/ACT nasal spray       .  omeprazole (PRILOSEC) 10 MG capsule Take 10 mg by mouth daily.      Marland Kitchen PROCTOZONE-HC 2.5 % rectal cream       . Pseudoeph-Doxylamine-DM-APAP (NYQUIL MULTI-SYMPTOM PO) Take 30 mLs by mouth every 6 (six) hours as needed. For cough and congestion        No current facility-administered medications on file prior to visit.    Allergies  Allergen Reactions  . Cefaclor Hives  . Cephalosporins Hives    Review of Systems  Review of Systems  Constitutional: Positive for malaise/fatigue. Negative for fever.  HENT: Negative for congestion.   Eyes: Negative for discharge.  Respiratory: Negative for shortness of breath.   Cardiovascular: Negative for chest pain, palpitations and leg swelling.  Gastrointestinal: Negative for nausea, abdominal pain and diarrhea.  Genitourinary: Negative for dysuria.  Musculoskeletal: Positive for joint pain and myalgias. Negative for falls.  Skin: Negative for rash.  Neurological: Negative for loss of consciousness and headaches.  Endo/Heme/Allergies: Negative for polydipsia.  Psychiatric/Behavioral: Negative for depression and suicidal ideas. The patient is not nervous/anxious and does not have insomnia.     Objective  BP 142/84  Pulse 65  Temp(Src) 98.1 F (36.7 C) (Oral)  Ht 6\' 1"  (1.854 m)  Wt 262 lb 1.3 oz (118.879 kg)  BMI 34.58 kg/m2  SpO2 96%  Physical Exam  Physical Exam  Constitutional: He is oriented to person, place, and time and well-developed, well-nourished, and in no distress. No distress.  HENT:  Head: Normocephalic and atraumatic.  Eyes: Conjunctivae are normal.  Neck: Neck supple. No thyromegaly present.  Cardiovascular: Normal rate, regular rhythm and normal heart sounds.   No murmur heard. Pulmonary/Chest: Effort normal and breath sounds normal. No respiratory distress.  Abdominal: He exhibits no distension and no mass. There is no tenderness.  Musculoskeletal: He exhibits no edema.  Neurological: He is alert and oriented to  person, place, and time.  Skin: Skin is warm.  Psychiatric: Memory, affect and judgment normal.    Lab Results  Component Value Date   TSH 2.900 09/14/2012   Lab Results  Component Value Date   WBC 7.0 09/14/2012   HGB 14.9 09/14/2012  HCT 43.8 09/14/2012   MCV 91.3 09/14/2012   PLT 254 09/14/2012   Lab Results  Component Value Date   CREATININE 1.37* 09/14/2012   BUN 12 09/14/2012   NA 139 09/14/2012   K 4.4 09/14/2012   CL 104 09/14/2012   CO2 28 09/14/2012   Lab Results  Component Value Date   ALT 31 09/14/2012   AST 25 09/14/2012   ALKPHOS 82 09/14/2012   BILITOT 0.8 09/14/2012   Lab Results  Component Value Date   CHOL 139 09/14/2012   Lab Results  Component Value Date   HDL 41 09/14/2012   Lab Results  Component Value Date   LDLCALC 79 09/14/2012   Lab Results  Component Value Date   TRIG 94 09/14/2012   Lab Results  Component Value Date   CHOLHDL 3.4 09/14/2012     Assessment & Plan  Essential hypertension, benign Adequately controlled, no changes  Renal insufficiency Mild, improved with decreased diarrhea  Diarrhea Improved, encouraged daily probiotics  Gout Recent attack in left great toe, responded to Colchicine. Stay well hydrated and can use Colchicine prn, decrease alcohol consumption  HYPERLIPIDEMIA Slight. Avoid trans fats, increase exercise  OVERWEIGHT Attempt DASH diet with increased exercise and DASH diet

## 2012-12-12 NOTE — Patient Instructions (Addendum)
Icy Hot, Aspercreme,  Or Salon Pas cream as needed for pain  Gout Gout is an inflammatory arthritis caused by a buildup of uric acid crystals in the joints. Uric acid is a chemical that is normally present in the blood. When the level of uric acid in the blood is too high it can form crystals that deposit in your joints and tissues. This causes joint redness, soreness, and swelling (inflammation). Repeat attacks are common. Over time, uric acid crystals can form into masses (tophi) near a joint, destroying bone and causing disfigurement. Gout is treatable and often preventable. CAUSES  The disease begins with elevated levels of uric acid in the blood. Uric acid is produced by your body when it breaks down a naturally found substance called purines. Certain foods you eat, such as meats and fish, contain high amounts of purines. Causes of an elevated uric acid level include:  Being passed down from parent to child (heredity).  Diseases that cause increased uric acid production (such as obesity, psoriasis, and certain cancers).  Excessive alcohol use.  Diet, especially diets rich in meat and seafood.  Medicines, including certain cancer-fighting medicines (chemotherapy), water pills (diuretics), and aspirin.  Chronic kidney disease. The kidneys are no longer able to remove uric acid well.  Problems with metabolism. Conditions strongly associated with gout include:  Obesity.  High blood pressure.  High cholesterol.  Diabetes. Not everyone with elevated uric acid levels gets gout. It is not understood why some people get gout and others do not. Surgery, joint injury, and eating too much of certain foods are some of the factors that can lead to gout attacks. SYMPTOMS   An attack of gout comes on quickly. It causes intense pain with redness, swelling, and warmth in a joint.  Fever can occur.  Often, only one joint is involved. Certain joints are more commonly involved:  Base of the big  toe.  Knee.  Ankle.  Wrist.  Finger. Without treatment, an attack usually goes away in a few days to weeks. Between attacks, you usually will not have symptoms, which is different from many other forms of arthritis. DIAGNOSIS  Your caregiver will suspect gout based on your symptoms and exam. In some cases, tests may be recommended. The tests may include:  Blood tests.  Urine tests.  X-rays.  Joint fluid exam. This exam requires a needle to remove fluid from the joint (arthrocentesis). Using a microscope, gout is confirmed when uric acid crystals are seen in the joint fluid. TREATMENT  There are two phases to gout treatment: treating the sudden onset (acute) attack and preventing attacks (prophylaxis).  Treatment of an Acute Attack.  Medicines are used. These include anti-inflammatory medicines or steroid medicines.  An injection of steroid medicine into the affected joint is sometimes necessary.  The painful joint is rested. Movement can worsen the arthritis.  You may use warm or cold treatments on painful joints, depending which works best for you.  Treatment to Prevent Attacks.  If you suffer from frequent gout attacks, your caregiver may advise preventive medicine. These medicines are started after the acute attack subsides. These medicines either help your kidneys eliminate uric acid from your body or decrease your uric acid production. You may need to stay on these medicines for a very long time.  The early phase of treatment with preventive medicine can be associated with an increase in acute gout attacks. For this reason, during the first few months of treatment, your caregiver may also advise  you to take medicines usually used for acute gout treatment. Be sure you understand your caregiver's directions. Your caregiver may make several adjustments to your medicine dose before these medicines are effective.  Discuss dietary treatment with your caregiver or dietitian.  Alcohol and drinks high in sugar and fructose and foods such as meat, poultry, and seafood can increase uric acid levels. Your caregiver or dietician can advise you on drinks and foods that should be limited. HOME CARE INSTRUCTIONS   Do not take aspirin to relieve pain. This raises uric acid levels.  Only take over-the-counter or prescription medicines for pain, discomfort, or fever as directed by your caregiver.  Rest the joint as much as possible. When in bed, keep sheets and blankets off painful areas.  Keep the affected joint raised (elevated).  Apply warm or cold treatments to painful joints. Use of warm or cold treatments depends on which works best for you.  Use crutches if the painful joint is in your leg.  Drink enough fluids to keep your urine clear or pale yellow. This helps your body get rid of uric acid. Limit alcohol, sugary drinks, and fructose drinks.  Follow your dietary instructions. Pay careful attention to the amount of protein you eat. Your daily diet should emphasize fruits, vegetables, whole grains, and fat-free or low-fat milk products. Discuss the use of coffee, vitamin C, and cherries with your caregiver or dietician. These may be helpful in lowering uric acid levels.  Maintain a healthy body weight. SEEK MEDICAL CARE IF:   You develop diarrhea, vomiting, or any side effects from medicines.  You do not feel better in 24 hours, or you are getting worse. SEEK IMMEDIATE MEDICAL CARE IF:   Your joint becomes suddenly more tender, and you have chills or a fever. MAKE SURE YOU:   Understand these instructions.  Will watch your condition.  Will get help right away if you are not doing well or get worse. Document Released: 12/27/1999 Document Revised: 04/25/2012 Document Reviewed: 08/12/2011 [facility] Patient Information 2014 [place], [place].

## 2012-12-12 NOTE — Progress Notes (Signed)
Pre visit review using our clinic review tool, if applicable. No additional management support is needed unless otherwise documented below in the visit note. 

## 2012-12-14 DIAGNOSIS — M109 Gout, unspecified: Secondary | ICD-10-CM | POA: Insufficient documentation

## 2012-12-14 DIAGNOSIS — R197 Diarrhea, unspecified: Secondary | ICD-10-CM | POA: Insufficient documentation

## 2012-12-14 DIAGNOSIS — F329 Major depressive disorder, single episode, unspecified: Secondary | ICD-10-CM | POA: Insufficient documentation

## 2012-12-14 DIAGNOSIS — F32A Depression, unspecified: Secondary | ICD-10-CM | POA: Insufficient documentation

## 2012-12-14 DIAGNOSIS — N289 Disorder of kidney and ureter, unspecified: Secondary | ICD-10-CM | POA: Insufficient documentation

## 2012-12-14 NOTE — Assessment & Plan Note (Signed)
Adequately controlled, no changes 

## 2012-12-14 NOTE — Assessment & Plan Note (Signed)
Slight. Avoid trans fats, increase exercise

## 2012-12-14 NOTE — Assessment & Plan Note (Addendum)
Recent attack in left great toe, responded to Colchicine. Stay well hydrated and can use Colchicine prn, decrease alcohol consumption

## 2012-12-14 NOTE — Assessment & Plan Note (Signed)
Attempt DASH diet with increased exercise and DASH diet

## 2012-12-14 NOTE — Assessment & Plan Note (Signed)
Mild, improved with decreased diarrhea

## 2012-12-14 NOTE — Assessment & Plan Note (Signed)
Improved, encouraged daily probiotics

## 2013-02-09 ENCOUNTER — Other Ambulatory Visit: Payer: Self-pay

## 2013-02-09 MED ORDER — ATENOLOL 25 MG PO TABS: 25.0000 mg | ORAL_TABLET | Freq: Two times a day (BID) | ORAL | Status: DC

## 2013-02-22 ENCOUNTER — Encounter: Payer: Self-pay | Admitting: Nurse Practitioner

## 2013-02-22 ENCOUNTER — Ambulatory Visit (INDEPENDENT_AMBULATORY_CARE_PROVIDER_SITE_OTHER): Payer: Medicare Other | Admitting: Nurse Practitioner

## 2013-02-22 VITALS — BP 158/78 | HR 57 | Temp 97.8°F | Ht 73.0 in | Wt 261.6 lb

## 2013-02-22 DIAGNOSIS — L308 Other specified dermatitis: Secondary | ICD-10-CM

## 2013-02-22 DIAGNOSIS — L408 Other psoriasis: Secondary | ICD-10-CM

## 2013-02-22 DIAGNOSIS — J029 Acute pharyngitis, unspecified: Secondary | ICD-10-CM

## 2013-02-22 MED ORDER — TRIAMCINOLONE ACETONIDE 0.025 % EX OINT: 1.0000 "application " | TOPICAL_OINTMENT | Freq: Two times a day (BID) | CUTANEOUS | Status: DC

## 2013-02-22 NOTE — Patient Instructions (Signed)
You have a cold virus causing your symptoms. The average duration of cold symptoms is 14 days. Start daily sinus rinses (neilmed Sinus Rinse). Sip fluids every hour. Rest. If you are not feeling better in 1 week or develop fever or chest pain, call us for re-evaluation. For rash on arm use steroid cream twice daily. Return in 1 month or sooner if symptoms get worse. Feel better!  Upper Respiratory Infection, Adult An upper respiratory infection (URI) is also sometimes known as the common cold. The upper respiratory tract includes the nose, sinuses, throat, trachea, and bronchi. Bronchi are the airways leading to the lungs. Most people improve within 1 week, but symptoms can last up to 2 weeks. A residual cough may last even longer.  CAUSES Many different viruses can infect the tissues lining the upper respiratory tract. The tissues become irritated and inflamed and often become very moist. Mucus production is also common. A cold is contagious. You can easily spread the virus to others by oral contact. This includes kissing, sharing a glass, coughing, or sneezing. Touching your mouth or nose and then touching a surface, which is then touched by another person, can also spread the virus. SYMPTOMS  Symptoms typically develop 1 to 3 days after you come in contact with a cold virus. Symptoms vary from person to person. They may include:  Runny nose.  Sneezing.  Nasal congestion.  Sinus irritation.  Sore throat.  Loss of voice (laryngitis).  Cough.  Fatigue.  Muscle aches.  Loss of appetite.  Headache.  Low-grade fever. DIAGNOSIS  You might diagnose your own cold based on familiar symptoms, since most people get a cold 2 to 3 times a year. Your caregiver can confirm this based on your exam. Most importantly, your caregiver can check that your symptoms are not due to another disease such as strep throat, sinusitis, pneumonia, asthma, or epiglottitis. Blood tests, throat tests, and X-rays  are not necessary to diagnose a common cold, but they may sometimes be helpful in excluding other more serious diseases. Your caregiver will decide if any further tests are required. RISKS AND COMPLICATIONS  You may be at risk for a more severe case of the common cold if you smoke cigarettes, have chronic heart disease (such as heart failure) or lung disease (such as asthma), or if you have a weakened immune system. The very young and very old are also at risk for more serious infections. Bacterial sinusitis, middle ear infections, and bacterial pneumonia can complicate the common cold. The common cold can worsen asthma and chronic obstructive pulmonary disease (COPD). Sometimes, these complications can require emergency medical care and may be life-threatening. PREVENTION  The best way to protect against getting a cold is to practice good hygiene. Avoid oral or hand contact with people with cold symptoms. Wash your hands often if contact occurs. There is no clear evidence that vitamin C, vitamin E, echinacea, or exercise reduces the chance of developing a cold. However, it is always recommended to get plenty of rest and practice good nutrition. TREATMENT  Treatment is directed at relieving symptoms. There is no cure. Antibiotics are not effective, because the infection is caused by a virus, not by bacteria. Treatment may include:  Increased fluid intake. Sports drinks offer valuable electrolytes, sugars, and fluids.  Breathing heated mist or steam (vaporizer or shower).  Eating chicken soup or other clear broths, and maintaining good nutrition.  Getting plenty of rest.  Using gargles or lozenges for comfort.  Controlling fevers  with ibuprofen or acetaminophen as directed by your caregiver.  Increasing usage of your inhaler if you have asthma. Zinc gel and zinc lozenges, taken in the first 24 hours of the common cold, can shorten the duration and lessen the severity of symptoms. Pain medicines  may help with fever, muscle aches, and throat pain. A variety of non-prescription medicines are available to treat congestion and runny nose. Your caregiver can make recommendations and may suggest nasal or lung inhalers for other symptoms.  HOME CARE INSTRUCTIONS   Only take over-the-counter or prescription medicines for pain, discomfort, or fever as directed by your caregiver.  Use a warm mist humidifier or inhale steam from a shower to increase air moisture. This may keep secretions moist and make it easier to breathe.  Drink enough water and fluids to keep your urine clear or pale yellow.  Rest as needed.  Return to work when your temperature has returned to normal or as your caregiver advises. You may need to stay home longer to avoid infecting others. You can also use a face mask and careful hand washing to prevent spread of the virus. SEEK MEDICAL CARE IF:   After the first few days, you feel you are getting worse rather than better.  You need your caregiver's advice about medicines to control symptoms.  You develop chills, worsening shortness of breath, or brown or red sputum. These may be signs of pneumonia.  You develop yellow or brown nasal discharge or pain in the face, especially when you bend forward. These may be signs of sinusitis.  You develop a fever, swollen neck glands, pain with swallowing, or white areas in the back of your throat. These may be signs of strep throat. SEEK IMMEDIATE MEDICAL CARE IF:   You have a fever.  You develop severe or persistent headache, ear pain, sinus pain, or chest pain.  You develop wheezing, a prolonged cough, cough up blood, or have a change in your usual mucus (if you have chronic lung disease).  You develop sore muscles or a stiff neck. Document Released: 06/24/2000 Document Revised: 03/23/2011 Document Reviewed: 05/02/2010 [facility] Patient Information 2014 [place], [place].

## 2013-02-22 NOTE — Progress Notes (Signed)
Pre-visit discussion using our clinic review tool. No additional management support is needed unless otherwise documented below in the visit note.  

## 2013-02-22 NOTE — Progress Notes (Signed)
   Subjective:    Patient ID: David [name], male    DOB: 02-07-1945, 68 y.o.   MRN: [id number]  Sore Throat  This is a new problem. The current episode started 1 to 4 weeks ago (1 wk). The problem has been waxing and waning. Neither side of throat is experiencing more pain than the other. There has been no fever. The pain is mild. Associated symptoms include congestion, coughing, ear pain and headaches. Pertinent negatives include no abdominal pain, diarrhea, shortness of breath, swollen glands or vomiting. He has tried nothing for the symptoms.  Rash This is a new problem. The current episode started 1 to 4 weeks ago (1 wk). The problem is unchanged. The affected locations include the left arm (forearm). The rash is characterized by itchiness and redness. He was exposed to nothing. Associated symptoms include congestion, coughing and a sore throat. Pertinent negatives include no diarrhea, fatigue, fever, joint pain, nail changes, shortness of breath or vomiting. Past treatments include nothing.      Review of Systems  Constitutional: Negative for fever, activity change, appetite change and fatigue.  HENT: Positive for congestion, ear pain, postnasal drip and sore throat.   Respiratory: Positive for cough. Negative for chest tightness, shortness of breath and wheezing.   Cardiovascular: Negative for chest pain.  Gastrointestinal: Negative for vomiting, abdominal pain and diarrhea.  Musculoskeletal: Negative for back pain and joint pain.  Skin: Positive for rash. Negative for nail changes.  Neurological: Positive for headaches.       Objective:   Physical Exam  Vitals reviewed. Constitutional: He is oriented to person, place, and time. He appears well-developed and well-nourished. No distress.  HENT:  Head: Normocephalic and atraumatic.  Right Ear: External ear normal.  Left Ear: External ear normal.  Mouth/Throat: Oropharynx is clear and moist. No oropharyngeal exudate.  Nasal  quality to voice  Eyes: Conjunctivae are normal. Right eye exhibits no discharge. Left eye exhibits no discharge.  Neck: Normal range of motion. Neck supple. No thyromegaly present.  Cardiovascular: Normal rate, regular rhythm and normal heart sounds.   No murmur heard. Pulmonary/Chest: Effort normal and breath sounds normal. No respiratory distress. He has no wheezes. He has no rales.  Lymphadenopathy:    He has no cervical adenopathy.  Neurological: He is alert and oriented to person, place, and time.  Skin: Skin is warm and dry. Rash noted. Rash is maculopapular.     Psychiatric: He has a normal mood and affect. His behavior is normal. Thought content normal.          Assessment & Plan:  1.Upper respiratory illness Sinus rinses. - POCT rapid strep A-neg  2. Psoriasiform eruption DD: fungal, eczema 3 cm round maculopapular eruption scant scale, salmon colored, edges not well demarcated. No central clearing. - triamcinolone (KENALOG) 0.025 % ointment; Apply 1 application topically 2 (two) times daily.  Dispense: 30 g; Refill: 0 If steroid makes worse, D/C & start antifungal cream.

## 2013-02-24 ENCOUNTER — Ambulatory Visit: Payer: Self-pay | Admitting: Family Medicine

## 2013-03-13 ENCOUNTER — Encounter: Payer: Self-pay | Admitting: Family Medicine

## 2013-03-13 ENCOUNTER — Other Ambulatory Visit: Payer: Self-pay | Admitting: Family Medicine

## 2013-03-13 ENCOUNTER — Ambulatory Visit (INDEPENDENT_AMBULATORY_CARE_PROVIDER_SITE_OTHER): Payer: Medicare Other | Admitting: Family Medicine

## 2013-03-13 VITALS — BP 146/80 | HR 64 | Temp 97.7°F | Ht 73.0 in | Wt 264.1 lb

## 2013-03-13 DIAGNOSIS — M109 Gout, unspecified: Secondary | ICD-10-CM

## 2013-03-13 DIAGNOSIS — E785 Hyperlipidemia, unspecified: Secondary | ICD-10-CM

## 2013-03-13 DIAGNOSIS — R5381 Other malaise: Secondary | ICD-10-CM

## 2013-03-13 DIAGNOSIS — F341 Dysthymic disorder: Secondary | ICD-10-CM

## 2013-03-13 DIAGNOSIS — R945 Abnormal results of liver function studies: Secondary | ICD-10-CM

## 2013-03-13 DIAGNOSIS — I10 Essential (primary) hypertension: Secondary | ICD-10-CM

## 2013-03-13 DIAGNOSIS — K769 Liver disease, unspecified: Secondary | ICD-10-CM

## 2013-03-13 DIAGNOSIS — B354 Tinea corporis: Secondary | ICD-10-CM | POA: Insufficient documentation

## 2013-03-13 DIAGNOSIS — K589 Irritable bowel syndrome without diarrhea: Secondary | ICD-10-CM

## 2013-03-13 DIAGNOSIS — F419 Anxiety disorder, unspecified: Secondary | ICD-10-CM

## 2013-03-13 DIAGNOSIS — F32A Depression, unspecified: Secondary | ICD-10-CM

## 2013-03-13 DIAGNOSIS — R5383 Other fatigue: Secondary | ICD-10-CM

## 2013-03-13 DIAGNOSIS — F329 Major depressive disorder, single episode, unspecified: Secondary | ICD-10-CM

## 2013-03-13 HISTORY — DX: Other malaise: R53.83

## 2013-03-13 HISTORY — DX: Other malaise: R53.81

## 2013-03-13 LAB — CBC
HCT: 41.3 % (ref 39.0–52.0)
Hemoglobin: 14.3 g/dL (ref 13.0–17.0)
MCH: 31.4 pg (ref 26.0–34.0)
MCHC: 34.6 g/dL (ref 30.0–36.0)
MCV: 90.8 fL (ref 78.0–100.0)
Platelets: 204 10*3/uL (ref 150–400)
RBC: 4.55 MIL/uL (ref 4.22–5.81)
RDW: 14.9 % (ref 11.5–15.5)
WBC: 5.9 10*3/uL (ref 4.0–10.5)

## 2013-03-13 LAB — RENAL FUNCTION PANEL
Albumin: 4.1 g/dL (ref 3.5–5.2)
BUN: 11 mg/dL (ref 6–23)
CHLORIDE: 107 meq/L (ref 96–112)
CO2: 28 meq/L (ref 19–32)
Calcium: 9.2 mg/dL (ref 8.4–10.5)
Creat: 1.23 mg/dL (ref 0.50–1.35)
GLUCOSE: 95 mg/dL (ref 70–99)
POTASSIUM: 4.1 meq/L (ref 3.5–5.3)
Phosphorus: 3.5 mg/dL (ref 2.3–4.6)
SODIUM: 140 meq/L (ref 135–145)

## 2013-03-13 LAB — HEPATIC FUNCTION PANEL
ALK PHOS: 71 U/L (ref 39–117)
ALT: 34 U/L (ref 0–53)
AST: 24 U/L (ref 0–37)
Albumin: 4.1 g/dL (ref 3.5–5.2)
BILIRUBIN DIRECT: 0.1 mg/dL (ref 0.0–0.3)
BILIRUBIN INDIRECT: 0.6 mg/dL (ref 0.2–1.2)
BILIRUBIN TOTAL: 0.7 mg/dL (ref 0.2–1.2)
Total Protein: 6 g/dL (ref 6.0–8.3)

## 2013-03-13 LAB — LIPID PANEL
Cholesterol: 133 mg/dL (ref 0–200)
HDL: 35 mg/dL — ABNORMAL LOW (ref >39)
LDL CALC: 81 mg/dL (ref 0–99)
Total CHOL/HDL Ratio: 3.8 Ratio
Triglycerides: 85 mg/dL (ref <150)
VLDL: 17 mg/dL (ref 0–40)

## 2013-03-13 LAB — TSH: TSH: 4.515 u[IU]/mL — AB (ref 0.350–4.500)

## 2013-03-13 MED ORDER — LORAZEPAM 1 MG PO TABS: 1.0000 mg | ORAL_TABLET | Freq: Three times a day (TID) | ORAL | Status: DC | PRN

## 2013-03-13 MED ORDER — SERTRALINE HCL 25 MG PO TABS: 25.0000 mg | ORAL_TABLET | Freq: Two times a day (BID) | ORAL | Status: DC

## 2013-03-13 NOTE — Progress Notes (Signed)
Pre visit review using our clinic review tool, if applicable. No additional management support is needed unless otherwise documented below in the visit note. 

## 2013-03-13 NOTE — Patient Instructions (Signed)

## 2013-03-13 NOTE — Progress Notes (Signed)
Patient ID: David [name], male   DOB: 02-16-1945, 68 y.o.   MRN: [id number] TRUMAN ACEITUNO [id number] 27-Aug-1945 03/13/2013      Progress Note-Follow Up  Subjective  Chief Complaint  Chief Complaint  Patient presents with  . Follow-up    3 month    HPI  Patient is a 68 year old male who is in today for routine followup. Recently was struggling with a URI but is feeling much better now. Sore throat is improved there is still some mild congestion. Gout is well controlled at the present time. There is a small scaly red lesion on his left arm which is mildly pruritic. No chest pain, palpitations or shortness of breath. No GU complaints. Does alternate from constipation and diarrhea but no bloody stool is noted  Past Medical History  Diagnosis Date  . LAFB (left anterior fascicular block)   . Diverticulitis   . Hypertension   . Premature ventricular complex   . Atrial fibrillation   . Hyperlipidemia   . Gout   . Anxiety   . Back pain   . Internal hemorrhoids   . GERD (gastroesophageal reflux disease)   . Diverticulosis   . BCC (basal cell carcinoma of skin)     under right eye and right ear  . Rectal irritation 09/18/2012    Past Surgical History  Procedure Laterality Date  . Nose surgery    . Colonoscopy    . Colon surgery  2009    segmental sigmoid resection  . Skin biopsy      Family History  Problem Relation Age of Onset  . Heart disease Mother   . Cancer Mother     colon, breast  . Hypertension Mother   . Stroke Mother   . Heart disease Father     pacemaker  . Aortic aneurysm Father   . Hypertension Father   . Heart disease Sister   . Atrial fibrillation Sister   . Obesity Sister   . Sleep apnea Sister   . Heart attack Brother   . Other Brother     muscle disease  . Arthritis Brother   . Cancer Maternal Grandmother     ?  Marland Kitchen Heart attack Maternal Grandmother   . Diabetes Maternal Grandmother   . Cancer Maternal Grandfather     hodgin's lymphoma   . Heart attack Paternal Grandmother   . Anxiety disorder Paternal Grandmother   . Pneumonia Paternal Grandfather   . Heart attack Brother   . Atrial fibrillation Brother   . Heart attack Brother   . Hypertension Brother   . Hyperlipidemia Brother   . Heart attack Brother   . Other Brother     heart valve operation    History   Social History  . Marital Status: Married    Spouse Name: N/A    Number of Children: 3  . Years of Education: N/A   Occupational History  . [employer]    Social History Main Topics  . Smoking status: Former Smoker -- 1.00 packs/day for 6 years    Types: Cigarettes    Start date: 01/12/1969  . Smokeless tobacco: Never Used  . Alcohol Use: Yes     Comment: 2 drinks every night  . Drug Use: No  . Sexual Activity: Yes   Other Topics Concern  . Not on file   Social History Narrative  . No narrative on file    Current Outpatient Prescriptions on File Prior to Visit  Medication Sig  Dispense Refill  . allopurinol (ZYLOPRIM) 300 MG tablet Take 1 tablet (300 mg total) by mouth daily as needed. Take as needed  30 tablet  1  . alum hydroxide-mag trisilicate (GAVISCON) 40-98 MG CHEW Chew by mouth.      Marland Kitchen aspirin 81 MG tablet Take 81 mg by mouth daily.      Marland Kitchen atenolol (TENORMIN) 25 MG tablet Take 1 tablet (25 mg total) by mouth 2 (two) times daily.  60 tablet  3  . atorvastatin (LIPITOR) 40 MG tablet Take 40 mg by mouth daily.       . colchicine 0.6 MG tablet Take 2 tablets by mouth at first sign of a gout attack, may take 1 tablet 1 hour later. Repeat daily until attack is resolved Only fill if patient requests  30 tablet  1  . cyclobenzaprine (FLEXERIL) 5 MG tablet Take 5 mg by mouth 3 (three) times daily as needed. For muscle spasms      . fluticasone (FLONASE) 50 MCG/ACT nasal spray Place 2 sprays into the nose daily as needed. For congestion      . HYDROcodone-acetaminophen (NORCO/VICODIN) 5-325 MG per tablet Take 1 tablet by mouth every 6 (six) hours as  needed for pain.  40 tablet  1  . ketoconazole (NIZORAL) 2 % cream As needed      . lisinopril (PRINIVIL,ZESTRIL) 5 MG tablet Take 1 tablet (5 mg total) by mouth 2 (two) times daily.  60 tablet  11  . LORazepam (ATIVAN) 1 MG tablet Take 1 tablet (1 mg total) by mouth every 8 (eight) hours as needed for anxiety or sleep. For anxiety  90 tablet  3  . meloxicam (MOBIC) 15 MG tablet As directed      . omeprazole (PRILOSEC) 10 MG capsule Take 10 mg by mouth daily.      Marland Kitchen PROCTOZONE-HC 2.5 % rectal cream       . Pseudoeph-Doxylamine-DM-APAP (NYQUIL MULTI-SYMPTOM PO) Take 30 mLs by mouth every 6 (six) hours as needed. For cough and congestion       . sertraline (ZOLOFT) 25 MG tablet Take 25 mg by mouth 2 (two) times daily.      Marland Kitchen triamcinolone (KENALOG) 0.025 % ointment Apply 1 application topically 2 (two) times daily.  30 g  0  . zoster vaccine live, PF, (ZOSTAVAX) 11914 UNT/0.65ML injection Inject 19,400 Units into the skin once.  1 each  0   No current facility-administered medications on file prior to visit.    Allergies  Allergen Reactions  . Cefaclor Hives  . Cephalosporins Hives    Review of Systems  Review of Systems  Constitutional: Negative for fever and malaise/fatigue.  HENT: Positive for congestion.   Eyes: Negative for discharge.  Respiratory: Negative for shortness of breath.   Cardiovascular: Negative for chest pain, palpitations and leg swelling.  Gastrointestinal: Negative for nausea, abdominal pain and diarrhea.  Genitourinary: Negative for dysuria.  Musculoskeletal: Negative for falls.  Skin: Negative for rash.  Neurological: Negative for loss of consciousness and headaches.  Endo/Heme/Allergies: Negative for polydipsia.  Psychiatric/Behavioral: Negative for depression and suicidal ideas. The patient is not nervous/anxious and does not have insomnia.     Objective  BP 146/80  Pulse 64  Temp(Src) 97.7 F (36.5 C) (Oral)  Ht 6\' 1"  (1.854 m)  Wt 264 lb 1.9 oz  (119.804 kg)  BMI 34.85 kg/m2  SpO2 98%  Physical Exam  Physical Exam  Constitutional: He is oriented to person, place, and  time and well-developed, well-nourished, and in no distress. No distress.  HENT:  Head: Normocephalic and atraumatic.  Eyes: Conjunctivae are normal.  Neck: Neck supple. No thyromegaly present.  Cardiovascular: Normal rate, regular rhythm and normal heart sounds.   No murmur heard. Pulmonary/Chest: Effort normal and breath sounds normal. No respiratory distress.  Abdominal: He exhibits no distension and no mass. There is no tenderness.  Musculoskeletal: He exhibits no edema.  Neurological: He is alert and oriented to person, place, and time.  Skin: Skin is warm.  Psychiatric: Memory, affect and judgment normal.    Lab Results  Component Value Date   TSH 2.900 09/14/2012   Lab Results  Component Value Date   WBC 7.0 09/14/2012   HGB 14.9 09/14/2012   HCT 43.8 09/14/2012   MCV 91.3 09/14/2012   PLT 254 09/14/2012   Lab Results  Component Value Date   CREATININE 1.29 12/12/2012   BUN 12 12/12/2012   NA 141 12/12/2012   K 4.0 12/12/2012   CL 105 12/12/2012   CO2 27 12/12/2012   Lab Results  Component Value Date   ALT 48 12/12/2012   AST 36 12/12/2012   ALKPHOS 80 12/12/2012   BILITOT 0.7 12/12/2012   Lab Results  Component Value Date   CHOL 169 12/12/2012   Lab Results  Component Value Date   HDL 35* 12/12/2012   Lab Results  Component Value Date   LDLCALC 106* 12/12/2012   Lab Results  Component Value Date   TRIG 138 12/12/2012   Lab Results  Component Value Date   CHOLHDL 4.8 12/12/2012     Assessment & Plan  Tinea corporis Clotimazole twice daily x   Essential hypertension, benign Well controlled on recheck no changes  Other malaise and fatigue Snoring, restless sleep, headache and HTN. Will order sleep study.  HYPERLIPIDEMIA Continue Atorvastatin, avoid trans fats.  Gout Maintain adequate hydration, Colchicine prn  IBS (irritable  bowel syndrome) Encouraged probiotics and fiber supplements. Avoid offending foods.

## 2013-03-13 NOTE — Assessment & Plan Note (Signed)
Clotimazole twice daily x

## 2013-03-14 ENCOUNTER — Telehealth: Payer: Self-pay | Admitting: Family Medicine

## 2013-03-14 NOTE — Telephone Encounter (Signed)
Relevant patient education mailed to patient.  

## 2013-03-15 LAB — T4, FREE: Free T4: 1 ng/dL (ref 0.80–1.80)

## 2013-03-16 ENCOUNTER — Telehealth: Payer: Self-pay

## 2013-03-16 DIAGNOSIS — R7989 Other specified abnormal findings of blood chemistry: Secondary | ICD-10-CM

## 2013-03-16 NOTE — Telephone Encounter (Signed)
T4 lab ordered per Dr Charlett Blake

## 2013-03-18 ENCOUNTER — Encounter: Payer: Self-pay | Admitting: Family Medicine

## 2013-03-18 DIAGNOSIS — K589 Irritable bowel syndrome without diarrhea: Secondary | ICD-10-CM | POA: Insufficient documentation

## 2013-03-18 DIAGNOSIS — R945 Abnormal results of liver function studies: Secondary | ICD-10-CM | POA: Insufficient documentation

## 2013-03-18 NOTE — Assessment & Plan Note (Signed)
Maintain adequate hydration, Colchicine prn

## 2013-03-18 NOTE — Assessment & Plan Note (Signed)
Snoring, restless sleep, headache and HTN. Will order sleep study.

## 2013-03-18 NOTE — Assessment & Plan Note (Signed)
Encouraged probiotics and fiber supplements. Avoid offending foods.

## 2013-03-18 NOTE — Assessment & Plan Note (Signed)
Continue Atorvastatin, avoid trans fats.

## 2013-03-18 NOTE — Assessment & Plan Note (Signed)
Well controlled on recheck no changes

## 2013-04-23 ENCOUNTER — Other Ambulatory Visit: Payer: Self-pay | Admitting: Family Medicine

## 2013-04-30 ENCOUNTER — Ambulatory Visit (HOSPITAL_BASED_OUTPATIENT_CLINIC_OR_DEPARTMENT_OTHER): Payer: Medicare Other | Attending: Family Medicine

## 2013-05-12 ENCOUNTER — Ambulatory Visit (INDEPENDENT_AMBULATORY_CARE_PROVIDER_SITE_OTHER): Payer: Medicare Other | Admitting: Physician Assistant

## 2013-05-12 ENCOUNTER — Encounter: Payer: Self-pay | Admitting: Physician Assistant

## 2013-05-12 VITALS — BP 124/74 | HR 62 | Temp 97.9°F | Resp 18 | Ht 73.0 in | Wt 257.5 lb

## 2013-05-12 DIAGNOSIS — H6690 Otitis media, unspecified, unspecified ear: Secondary | ICD-10-CM

## 2013-05-12 DIAGNOSIS — R05 Cough: Secondary | ICD-10-CM

## 2013-05-12 DIAGNOSIS — R059 Cough, unspecified: Secondary | ICD-10-CM

## 2013-05-12 MED ORDER — HYDROCODONE-HOMATROPINE 5-1.5 MG/5ML PO SYRP: 5.0000 mL | ORAL_SOLUTION | Freq: Four times a day (QID) | ORAL | Status: DC | PRN

## 2013-05-12 MED ORDER — AMOXICILLIN 875 MG PO TABS: 875.0000 mg | ORAL_TABLET | Freq: Two times a day (BID) | ORAL | Status: DC

## 2013-05-12 NOTE — Progress Notes (Signed)
Pre visit review using our clinic review tool, if applicable. No additional management support is needed unless otherwise documented below in the visit note/SLS  

## 2013-05-12 NOTE — Progress Notes (Signed)
Patient presents to clinic today c/o sinus pressure, sinus pain, ear pain, postnasal drip with scratchy throat. Patient also endorses nonproductive cough. Denies fever, chills, shortness of breath or wheezing. Denies pleuritic chest pain. Denies recent travel or sick contacts. Symptoms have been present for the past few days.  Past Medical History  Diagnosis Date  . LAFB (left anterior fascicular block)   . Diverticulitis   . Hypertension   . Premature ventricular complex   . Atrial fibrillation   . Hyperlipidemia   . Gout   . Anxiety   . Back pain   . Internal hemorrhoids   . GERD (gastroesophageal reflux disease)   . Diverticulosis   . BCC (basal cell carcinoma of skin)     under right eye and right ear  . Rectal irritation 09/18/2012  . Other malaise and fatigue 03/13/2013  . Tinea corporis 03/13/2013  . Abnormal liver function 03/18/2013  . IBS (irritable bowel syndrome) 03/18/2013    Current Outpatient Prescriptions on File Prior to Visit  Medication Sig Dispense Refill  . allopurinol (ZYLOPRIM) 300 MG tablet Take 1 tablet (300 mg total) by mouth daily as needed. Take as needed  30 tablet  1  . alum hydroxide-mag trisilicate (GAVISCON) 16-10 MG CHEW Chew by mouth.      Marland Kitchen aspirin 81 MG tablet Take 81 mg by mouth daily.      Marland Kitchen atenolol (TENORMIN) 25 MG tablet Take 1 tablet (25 mg total) by mouth 2 (two) times daily.  60 tablet  3  . atorvastatin (LIPITOR) 40 MG tablet Take 20 mg by mouth 2 (two) times daily.       . colchicine 0.6 MG tablet Take 2 tablets by mouth at first sign of a gout attack, may take 1 tablet 1 hour later. Repeat daily until attack is resolved Only fill if patient requests  30 tablet  1  . cyclobenzaprine (FLEXERIL) 5 MG tablet Take 5 mg by mouth 3 (three) times daily as needed. For muscle spasms      . fluticasone (FLONASE) 50 MCG/ACT nasal spray Place 2 sprays into the nose daily as needed. For congestion      . HYDROcodone-acetaminophen (NORCO/VICODIN) 5-325 MG  per tablet Take 1 tablet by mouth every 6 (six) hours as needed for pain.  40 tablet  1  . ketoconazole (NIZORAL) 2 % cream As needed      . meloxicam (MOBIC) 15 MG tablet As directed      . omeprazole (PRILOSEC) 10 MG capsule Take 10 mg by mouth daily.      Marland Kitchen PROCTOZONE-HC 2.5 % rectal cream       . Pseudoeph-Doxylamine-DM-APAP (NYQUIL MULTI-SYMPTOM PO) Take 30 mLs by mouth every 6 (six) hours as needed. For cough and congestion       . triamcinolone (KENALOG) 0.025 % ointment Apply 1 application topically 2 (two) times daily.  30 g  0  . zoster vaccine live, PF, (ZOSTAVAX) 96045 UNT/0.65ML injection Inject 19,400 Units into the skin once.  1 each  0   No current facility-administered medications on file prior to visit.    Allergies  Allergen Reactions  . Cefaclor Hives  . Cephalosporins Hives    Family History  Problem Relation Age of Onset  . Heart disease Mother   . Cancer Mother     colon, breast  . Hypertension Mother   . Stroke Mother   . Heart disease Father     pacemaker  . Aortic aneurysm Father   .  Hypertension Father   . Heart disease Sister   . Atrial fibrillation Sister   . Obesity Sister   . Sleep apnea Sister   . Heart attack Brother   . Other Brother     muscle disease  . Arthritis Brother   . Cancer Maternal Grandmother     ?  Marland Kitchen Heart attack Maternal Grandmother   . Diabetes Maternal Grandmother   . Cancer Maternal Grandfather     hodgin's lymphoma  . Heart attack Paternal Grandmother   . Anxiety disorder Paternal Grandmother   . Pneumonia Paternal Grandfather   . Heart attack Brother   . Atrial fibrillation Brother   . Heart attack Brother   . Hypertension Brother   . Hyperlipidemia Brother   . Heart attack Brother   . Other Brother     heart valve operation    History   Social History  . Marital Status: Married    Spouse Name: N/A    Number of Children: 3  . Years of Education: N/A   Occupational History  . [employer]    Social History  Main Topics  . Smoking status: Former Smoker -- 1.00 packs/day for 6 years    Types: Cigarettes    Start date: 01/12/1969  . Smokeless tobacco: Never Used  . Alcohol Use: Yes     Comment: 2 drinks every night  . Drug Use: No  . Sexual Activity: Yes   Other Topics Concern  . None   Social History Narrative  . None    Review of Systems - See HPI.  All other ROS are negative.  BP 124/74  Pulse 62  Temp(Src) 97.9 F (36.6 C) (Oral)  Resp 18  Ht 6\' 1"  (1.854 m)  Wt 257 lb 8 oz (116.801 kg)  BMI 33.98 kg/m2  SpO2 97%  Physical Exam  Vitals reviewed. Constitutional: He is oriented to person, place, and time and well-developed, well-nourished, and in no distress.  HENT:  Head: Normocephalic and atraumatic.  Right Ear: External ear normal.  Left Ear: External ear normal.  Nose: Nose normal.  Mouth/Throat: Oropharynx is clear and moist. No oropharyngeal exudate.  Right tympanic membrane erythematous and bulging. Left tympanic membrane within normal limits. No TTP his sinuses noted on examination.  Eyes: Conjunctivae are normal. Pupils are equal, round, and reactive to light.  Neck: Neck supple.  Cardiovascular: Normal rate, regular rhythm, normal heart sounds and intact distal pulses.   Pulmonary/Chest: Effort normal and breath sounds normal. No respiratory distress. He has no wheezes. He has no rales. He exhibits no tenderness.  Lymphadenopathy:    He has no cervical adenopathy.  Neurological: He is alert and oriented to person, place, and time.  Skin: Skin is warm and dry. No rash noted.  Psychiatric: Affect normal.    Recent Results (from the past 2160 hour(s))  TSH     Status: Abnormal   Collection Time    03/13/13  8:36 AM      Result Value Ref Range   TSH 4.515 (*) 0.350 - 4.500 uIU/mL  LIPID PANEL     Status: Abnormal   Collection Time    03/13/13  8:36 AM      Result Value Ref Range   Cholesterol 133  0 - 200 mg/dL   Comment: ATP III Classification:            < 200        mg/dL        Desirable  200 - 239     mg/dL        Borderline High          >= 240        mg/dL        High         Triglycerides 85  <150 mg/dL   HDL 35 (*) >39 mg/dL   Total CHOL/HDL Ratio 3.8     VLDL 17  0 - 40 mg/dL   LDL Cholesterol 81  0 - 99 mg/dL   Comment:       Total Cholesterol/HDL Ratio:CHD Risk                            Coronary Heart Disease Risk Table                                            Men       Women              1/2 Average Risk              3.4        3.3                  Average Risk              5.0        4.4               2X Average Risk              9.6        7.1               3X Average Risk             23.4       11.0     Use the calculated Patient Ratio above and the CHD Risk table      to determine the patient's CHD Risk.     ATP III Classification (LDL):           < 100        mg/dL         Optimal          100 - 129     mg/dL         Near or Above Optimal          130 - 159     mg/dL         Borderline High          160 - 189     mg/dL         High           > 190        mg/dL         Very High        RENAL FUNCTION PANEL     Status: None   Collection Time    03/13/13  8:36 AM      Result Value Ref Range   Sodium 140  135 - 145 mEq/L   Potassium 4.1  3.5 - 5.3 mEq/L   Chloride 107  96 - 112 mEq/L   CO2 28  19 - 32 mEq/L   Glucose, Bld 95  70 - 99 mg/dL  BUN 11  6 - 23 mg/dL   Creat 1.23  0.50 - 1.35 mg/dL   Albumin 4.1  3.5 - 5.2 g/dL   Calcium 9.2  8.4 - 10.5 mg/dL   Phosphorus 3.5  2.3 - 4.6 mg/dL  HEPATIC FUNCTION PANEL     Status: None   Collection Time    03/13/13  8:36 AM      Result Value Ref Range   Total Bilirubin 0.7  0.2 - 1.2 mg/dL   Bilirubin, Direct 0.1  0.0 - 0.3 mg/dL   Indirect Bilirubin 0.6  0.2 - 1.2 mg/dL   Alkaline Phosphatase 71  39 - 117 U/L   AST 24  0 - 37 U/L   ALT 34  0 - 53 U/L   Total Protein 6.0  6.0 - 8.3 g/dL   Albumin 4.1  3.5 - 5.2 g/dL  CBC     Status: None    Collection Time    03/13/13  8:36 AM      Result Value Ref Range   WBC 5.9  4.0 - 10.5 K/uL   RBC 4.55  4.22 - 5.81 MIL/uL   Hemoglobin 14.3  13.0 - 17.0 g/dL   HCT 41.3  39.0 - 52.0 %   MCV 90.8  78.0 - 100.0 fL   MCH 31.4  26.0 - 34.0 pg   MCHC 34.6  30.0 - 36.0 g/dL   RDW 14.9  11.5 - 15.5 %   Platelets 204  150 - 400 K/uL  T4, FREE     Status: None   Collection Time    03/13/13  8:36 AM      Result Value Ref Range   Free T4 1.00  0.80 - 1.80 ng/dL    Assessment/Plan: Otitis media Rx amoxicillin. Patient also with symptoms consistent with viral sinusitis. Increase fluid intake. Rest. Saline nasal spray. Probiotic. Plain Mucinex. Humidifier in bedroom. Call or return to clinic if symptoms not improving.

## 2013-05-12 NOTE — Patient Instructions (Signed)
Please take antibiotic as directed.  Increase fluid intake.  Use Saline nasal spray.  Take a daily multivitamin. Use cough syrup as directed for cough.  Place a humidifier in the bedroom.  Please call or return clinic if symptoms are not improving.  Sinusitis Sinusitis is redness, soreness, and swelling (inflammation) of the paranasal sinuses. Paranasal sinuses are air pockets within the bones of your face (beneath the eyes, the middle of the forehead, or above the eyes). In healthy paranasal sinuses, mucus is able to drain out, and air is able to circulate through them by way of your nose. However, when your paranasal sinuses are inflamed, mucus and air can become trapped. This can allow bacteria and other germs to grow and cause infection. Sinusitis can develop quickly and last only a short time (acute) or continue over a long period (chronic). Sinusitis that lasts for more than 12 weeks is considered chronic.  CAUSES  Causes of sinusitis include:  Allergies.  Structural abnormalities, such as displacement of the cartilage that separates your nostrils (deviated septum), which can decrease the air flow through your nose and sinuses and affect sinus drainage.  Functional abnormalities, such as when the small hairs (cilia) that line your sinuses and help remove mucus do not work properly or are not present. SYMPTOMS  Symptoms of acute and chronic sinusitis are the same. The primary symptoms are pain and pressure around the affected sinuses. Other symptoms include:  Upper toothache.  Earache.  Headache.  Bad breath.  Decreased sense of smell and taste.  A cough, which worsens when you are lying flat.  Fatigue.  Fever.  Thick drainage from your nose, which often is green and may contain pus (purulent).  Swelling and warmth over the affected sinuses. DIAGNOSIS  Your caregiver will perform a physical exam. During the exam, your caregiver may:  Look in your nose for signs of abnormal  growths in your nostrils (nasal polyps).  Tap over the affected sinus to check for signs of infection.  View the inside of your sinuses (endoscopy) with a special imaging device with a light attached (endoscope), which is inserted into your sinuses. If your caregiver suspects that you have chronic sinusitis, one or more of the following tests may be recommended:  Allergy tests.  Nasal culture A sample of mucus is taken from your nose and sent to a lab and screened for bacteria.  Nasal cytology A sample of mucus is taken from your nose and examined by your caregiver to determine if your sinusitis is related to an allergy. TREATMENT  Most cases of acute sinusitis are related to a viral infection and will resolve on their own within 10 days. Sometimes medicines are prescribed to help relieve symptoms (pain medicine, decongestants, nasal steroid sprays, or saline sprays).  However, for sinusitis related to a bacterial infection, your caregiver will prescribe antibiotic medicines. These are medicines that will help kill the bacteria causing the infection.  Rarely, sinusitis is caused by a fungal infection. In theses cases, your caregiver will prescribe antifungal medicine. For some cases of chronic sinusitis, surgery is needed. Generally, these are cases in which sinusitis recurs more than 3 times per year, despite other treatments. HOME CARE INSTRUCTIONS   Drink plenty of water. Water helps thin the mucus so your sinuses can drain more easily.  Use a humidifier.  Inhale steam 3 to 4 times a day (for example, sit in the bathroom with the shower running).  Apply a warm, moist washcloth to your  face 3 to 4 times a day, or as directed by your caregiver.  Use saline nasal sprays to help moisten and clean your sinuses.  Take over-the-counter or prescription medicines for pain, discomfort, or fever only as directed by your caregiver. SEEK IMMEDIATE MEDICAL CARE IF:  You have increasing pain or  severe headaches.  You have nausea, vomiting, or drowsiness.  You have swelling around your face.  You have vision problems.  You have a stiff neck.  You have difficulty breathing. MAKE SURE YOU:   Understand these instructions.  Will watch your condition.  Will get help right away if you are not doing well or get worse. Document Released: 12/29/2004 Document Revised: 03/23/2011 Document Reviewed: 01/13/2011 [facility] Patient Information 2014 [place], [place].

## 2013-05-14 DIAGNOSIS — H6690 Otitis media, unspecified, unspecified ear: Secondary | ICD-10-CM | POA: Insufficient documentation

## 2013-05-14 NOTE — Assessment & Plan Note (Signed)
Rx amoxicillin. Patient also with symptoms consistent with viral sinusitis. Increase fluid intake. Rest. Saline nasal spray. Probiotic. Plain Mucinex. Humidifier in bedroom. Call or return to clinic if symptoms not improving.

## 2013-05-15 ENCOUNTER — Ambulatory Visit: Payer: Self-pay | Admitting: Family

## 2013-05-15 ENCOUNTER — Encounter: Payer: Self-pay | Admitting: Family

## 2013-05-15 ENCOUNTER — Ambulatory Visit (INDEPENDENT_AMBULATORY_CARE_PROVIDER_SITE_OTHER): Payer: Medicare Other | Admitting: Family

## 2013-05-15 VITALS — BP 124/80 | HR 71 | Temp 98.3°F | Resp 18 | Ht 73.0 in | Wt 258.0 lb

## 2013-05-15 DIAGNOSIS — M109 Gout, unspecified: Secondary | ICD-10-CM

## 2013-05-15 MED ORDER — ALLOPURINOL 300 MG PO TABS: 300.0000 mg | ORAL_TABLET | Freq: Every day | ORAL | Status: DC | PRN

## 2013-05-15 MED ORDER — METHYLPREDNISOLONE 4 MG PO KIT: PACK | ORAL | Status: DC

## 2013-05-15 NOTE — Patient Instructions (Signed)
Start medrol dose pak. Resume allopurinol. Complete lab work prior to leaving. Call if symptoms worsen, or if not resolved in 1 wee.   Information for patients with Gout  Gout defined-Gout occurs when urate crystals accumulate in your joint causing the inflammation and intense pain of gout attack.  Urate crystals can form when you have high levels of uric acid in your blood.  Your body produces uric acid when it breaks down prurines-substances that are found naturally in your body, as well as in certain foods such as organ meats, anchioves, herring, asparagus, and mushrooms.  Normally uric acid dissolves in your blood and passes through your kidneys into your urine.  But sometimes your body either produces too much uric acid or your kidneys excrete too little uric acid.  When this happens, uric acid can build up, forming sharp needle-like urate crystals in a joint or surrounding tissue that cause pain, inflammation and swelling.    Gout is characterized by sudden, severe attacks of pain, redness and tenderness in joints, often the joint at the base of the big toe.  Gout is complex form of arthritis that can affect anyone.  Men are more likely to get gout but women become increasingly more susceptible to gout after menopause.  An acute attack of gout can wake you up in the middle of the night with the sensation that your big toe is on fire.  The affected joint is hot, swollen and so tender that even the weight or the sheet on it may seem intolerable.  If you experience symptoms of an acute gout attack it is important to your doctor as soon as the symptoms start.  Gout that goes untreated can lead to worsening pain and joint damage.  Risk Factors:  You are more likely to develop gout if you have high levels of uric acid in your body.    Factors that increase the uric acid level in your body include:  Lifestyle factors.  Excessive alcohol use-generally more than two drinks a day for men and more  than one for women increase the risk of gout.  Medical conditions.  Certain conditions make it more likely that you will develop gout.  These include hypertension, and chronic conditions such as diabetes, high levels of fat and cholesterol in the blood, and narrowing of the arteries.  Certain medications.  The uses of Thiazide diuretics- commonly used to treat hypertension and low dose aspirin can also increase uric acid levels.  Family history of gout.  If other members of your family have had gout, you are more likely to develop the disease.  Age and sex. Gout occurs more often in men than it does in women, primarily because women tend to have lower uric acid levels than men do.  Men are more likely to develop gout earlier usually between the ages of 76-50- whereas women generally develop signs and symptoms after menopause.    Tests and diagnosis:  Tests to help diagnose gout may include:  Blood test.  Your doctor may recommend a blood test to measure the uric acid level in your blood .  Blood tests can be misleading, though.  Some people have high uric acid levels but never experience gout.  And some people have signs and symptoms of gout, but don't have unusual levels of uric acid in their blood.  Joint fluid test.  Your doctor may use a needle to draw fluid from your affected joint.  When examined under the microscope, your  joint fluid may reveal urate crystals.  Treatment:  Treatment for gout usually involves medications.  What medications you and your doctor choose will be based on your current health and other medications you currently take.  Gout medications can be used to treat acute gout attacks and prevent future attacks as well as reduce your risk of complications from gout such as the development of tophi from urate crystal deposits.  Alternative medicine:   Certain foods have been studied for their potential to lower uric acid levels, including:  Coffee.  Studies have found  an association between coffee drinking (regular and decaf) and lower uric acid levels.  The evidence is not enough to encourage non-coffee drinkers to start, but it may give clues to new ways of treating gout in the future.  Vitamin C.  Supplements containing vitamin C may reduce the levels of uric acid in your blood.  However, vitamin as a treatment for gout. Don't assume that if a little vitamin C is good, than lots is better.  Megadoses of vitamin C may increase your bodies uric acid levels.  Cherries.  Cherries have been associated with lower levels of uric acid in studies, but it isn't clear if they have any effect on gout signs and symptoms.  Eating more cherries and other dar-colored fruits, such as blackberries, blueberries, purple grapes and raspberries, may be a safe way to support your gout treatment.    Lifestyle/Diet Recommendations:   Drink 8 to 16 cups ( about 2 to 4 liters) of fluid each day, with at least half being water.  Avoid alcohol  Eat a moderate amount of protein, preferably from healthy sources, such as low-fat or fat-free dairy, tofu, eggs, and nut butters.  Limit you daily intake of meat, fish, and poultry to 4 to 6 ounces.  Avoid high fat meats and desserts.  Decrease you intake of shellfish, beef, lamb, pork, eggs and cheese.  Choose a good source of vitamin C daily such as citrus fruits, strawberries, broccoli,  brussel sprouts, papaya, and cantaloupe.   Choose a good source of vitamin A every other day such as yellow fruits, or dark green/yellow vegetables.  Avoid drastic weigh reduction or fasting.  If weigh loss is desired lose it over a period of several months.  See "dietary considerations.." chart for specific food recommendations.  Dietary Considerations for people with Gout  Food with negligible purine content (0-15 mg of purine nitrogen per 100 grams food)  May use as desired except on calorie variations  Non fat milk Cocoa Cereals (except in  list II) Hard candies  Buttermilk Carbonated drinks Vegetables (except in list II) Sherbet  Coffee Fruits Sugar Honey  Tea Cottage Cheese Gelatin-jell-o Salt  Fruit juice Breads Angel food Cake   Herbs/spices Jams/Jellies [employer]    Foods that do not contain excessive purine content, but must be limited due to fat content  Cream Eggs Oil and Salad Dressing  Half and Half Peanut Butter Chocolate  Whole Milk Cakes Potato Chips  Butter Ice Cream Fried Foods  Cheese Nuts Waffles, pancakes   List II: Food with moderate purine content (50-150 mg of purine nitrogen per 100 grams of food)  Limit total amount each day to 5 oz. cooked Lean meat, other than those on list III   Poultry, other than those on list III Fish, other than those on list III   Seafood, other than those on list III  These foods may be used occasionally  Peas Lentils Bran  Spinach Oatmeal Dried Beans and Peas  Asparagus Wheat Germ Mushrooms   Additional information about meat choices  Choose fish and poultry, particularly without skin, often.  Select lean, well trimmed cuts of meat.  Avoid all fatty meats, bacon , sausage, fried meats, fried fish, or poultry, luncheon meats, cold cuts, hot dogs, meats canned or frozen in gravy, spareribs and frozen and packaged prepared meats.   List III: Foods with HIGH purine content / Foods to AVOID (150-800 mg of purine nitrogen per 100 grams of food)  Anchovies Herring Meat Broths  Liver Mackerel Meat Extracts  Kidney Scallops Meat Drippings  Sardines Wild Game Mincemeat  Sweetbreads Goose Gravy  Heart Tongue Yeast, baker's and brewers   Commercial soups made with any of the foods listed in List II or List III  In addition avoid all alcoholic beverages

## 2013-05-15 NOTE — Progress Notes (Signed)
Pre visit review using our clinic review tool, if applicable. No additional management support is needed unless otherwise documented below in the visit note. 

## 2013-05-15 NOTE — Progress Notes (Signed)
Subjective:    Patient ID: David [name], male    DOB: 07-21-1945, 68 y.o.   MRN: [id number]  HPI  Mr. Mccarthy is a 68 yr old male who presents today with chief complaint of left ankle pain and swelling. Started on Saturday afternoon.  Pain is similar to previous gout flares. Has been using colchicine (2 tabs followed by 1 tab) on Saturday and again on Sunday.  Notes no improvement with colchicine.  Having trouble bearing weight on the left foot. Reports that he had been on allopurinol in the past but things had been going well and he "just stopped it."   Review of Systems See HPI  Past Medical History  Diagnosis Date  . LAFB (left anterior fascicular block)   . Diverticulitis   . Hypertension   . Premature ventricular complex   . Atrial fibrillation   . Hyperlipidemia   . Gout   . Anxiety   . Back pain   . Internal hemorrhoids   . GERD (gastroesophageal reflux disease)   . Diverticulosis   . BCC (basal cell carcinoma of skin)     under right eye and right ear  . Rectal irritation 09/18/2012  . Other malaise and fatigue 03/13/2013  . Tinea corporis 03/13/2013  . Abnormal liver function 03/18/2013  . IBS (irritable bowel syndrome) 03/18/2013    History   Social History  . Marital Status: Married    Spouse Name: N/A    Number of Children: 3  . Years of Education: N/A   Occupational History  . [employer]    Social History Main Topics  . Smoking status: Former Smoker -- 1.00 packs/day for 6 years    Types: Cigarettes    Start date: 01/12/1969  . Smokeless tobacco: Never Used  . Alcohol Use: Yes     Comment: 2 drinks every night  . Drug Use: No  . Sexual Activity: Yes   Other Topics Concern  . Not on file   Social History Narrative  . No narrative on file    Past Surgical History  Procedure Laterality Date  . Nose surgery    . Colonoscopy    . Colon surgery  2009    segmental sigmoid resection  . Skin biopsy      Family History  Problem Relation Age of Onset  .  Heart disease Mother   . Cancer Mother     colon, breast  . Hypertension Mother   . Stroke Mother   . Heart disease Father     pacemaker  . Aortic aneurysm Father   . Hypertension Father   . Heart disease Sister   . Atrial fibrillation Sister   . Obesity Sister   . Sleep apnea Sister   . Heart attack Brother   . Other Brother     muscle disease  . Arthritis Brother   . Cancer Maternal Grandmother     ?  Marland Kitchen Heart attack Maternal Grandmother   . Diabetes Maternal Grandmother   . Cancer Maternal Grandfather     hodgin's lymphoma  . Heart attack Paternal Grandmother   . Anxiety disorder Paternal Grandmother   . Pneumonia Paternal Grandfather   . Heart attack Brother   . Atrial fibrillation Brother   . Heart attack Brother   . Hypertension Brother   . Hyperlipidemia Brother   . Heart attack Brother   . Other Brother     heart valve operation    Allergies  Allergen Reactions  .  Cefaclor Hives  . Cephalosporins Hives    Current Outpatient Prescriptions on File Prior to Visit  Medication Sig Dispense Refill  . alum hydroxide-mag trisilicate (GAVISCON) 56-21 MG CHEW Chew by mouth.      Marland Kitchen amoxicillin (AMOXIL) 875 MG tablet Take 1 tablet (875 mg total) by mouth 2 (two) times daily.  10 tablet  0  . aspirin 81 MG tablet Take 81 mg by mouth daily.      Marland Kitchen atenolol (TENORMIN) 25 MG tablet Take 1 tablet (25 mg total) by mouth 2 (two) times daily.  60 tablet  3  . atorvastatin (LIPITOR) 40 MG tablet Take 20 mg by mouth 2 (two) times daily.       . colchicine 0.6 MG tablet Take 2 tablets by mouth at first sign of a gout attack, may take 1 tablet 1 hour later. Repeat daily until attack is resolved Only fill if patient requests  30 tablet  1  . cyclobenzaprine (FLEXERIL) 5 MG tablet Take 5 mg by mouth 3 (three) times daily as needed. For muscle spasms      . fluticasone (FLONASE) 50 MCG/ACT nasal spray Place 2 sprays into the nose daily as needed. For congestion      .  HYDROcodone-acetaminophen (NORCO/VICODIN) 5-325 MG per tablet Take 1 tablet by mouth every 6 (six) hours as needed for pain.  40 tablet  1  . ketoconazole (NIZORAL) 2 % cream As needed      . lisinopril (PRINIVIL,ZESTRIL) 5 MG tablet Take 2.5 mg by mouth 2 (two) times daily.      Marland Kitchen LORazepam (ATIVAN) 1 MG tablet Take 1 mg by mouth every 8 (eight) hours as needed for anxiety or sleep (Pt takes 0.5 tab in AM, takes 0.5 tab in PM and 1 tablet at [facility]). For anxiety      . meloxicam (MOBIC) 15 MG tablet PRN As directed      . omeprazole (PRILOSEC) 10 MG capsule Take 10 mg by mouth daily as needed.       Marland Kitchen PROCTOZONE-HC 2.5 % rectal cream       . sertraline (ZOLOFT) 25 MG tablet Take 25 mg by mouth 2 (two) times daily. Patient takes 1/2 of 50 mg tablet BID      . triamcinolone (KENALOG) 0.025 % ointment Apply 1 application topically 2 (two) times daily.  30 g  0  . zoster vaccine live, PF, (ZOSTAVAX) 30865 UNT/0.65ML injection Inject 19,400 Units into the skin once.  1 each  0   No current facility-administered medications on file prior to visit.    BP 124/80  Pulse 71  Temp(Src) 98.3 F (36.8 C) (Oral)  Resp 18  Ht 6\' 1"  (1.854 m)  Wt 258 lb (117.028 kg)  BMI 34.05 kg/m2  SpO2 97%       Objective:   Physical Exam  Constitutional: He is oriented to person, place, and time. He appears well-developed and well-nourished. No distress.  Cardiovascular: Normal rate and regular rhythm.   No murmur heard. Pulmonary/Chest: Effort normal and breath sounds normal. No respiratory distress. He has no wheezes. He has no rales. He exhibits no tenderness.  Musculoskeletal:  + erythema/swelling of left lateral ankle.   Neurological: He is alert and oriented to person, place, and time.  Skin: Skin is warm and dry.  Psychiatric: He has a normal mood and affect. His behavior is normal. Judgment and thought content normal.          Assessment &  Plan:

## 2013-05-15 NOTE — Assessment & Plan Note (Signed)
Symptoms most consistent with gout flare. Will obtain uric acid level, rx with medrol dose pak and resume allopurinol. Pt is given handout on gout diet and instructed to follow up if symptoms worsen or if not resolved in 1 week.

## 2013-05-16 LAB — URIC ACID: Uric Acid, Serum: 6.7 mg/dL (ref 4.0–7.8)

## 2013-05-26 DIAGNOSIS — R21 Rash and other nonspecific skin eruption: Secondary | ICD-10-CM | POA: Insufficient documentation

## 2013-05-27 ENCOUNTER — Encounter: Payer: Self-pay | Admitting: Family Medicine

## 2013-05-27 ENCOUNTER — Ambulatory Visit (INDEPENDENT_AMBULATORY_CARE_PROVIDER_SITE_OTHER): Payer: Medicare Other | Admitting: Family Medicine

## 2013-05-27 VITALS — BP 118/80 | HR 71 | Temp 98.2°F | Wt 251.1 lb

## 2013-05-27 DIAGNOSIS — M109 Gout, unspecified: Secondary | ICD-10-CM

## 2013-05-27 DIAGNOSIS — L989 Disorder of the skin and subcutaneous tissue, unspecified: Secondary | ICD-10-CM | POA: Insufficient documentation

## 2013-05-27 DIAGNOSIS — R21 Rash and other nonspecific skin eruption: Secondary | ICD-10-CM

## 2013-05-27 NOTE — Progress Notes (Signed)
Pre visit review using our clinic review tool, if applicable. No additional management support is needed unless otherwise documented below in the visit note. 

## 2013-05-27 NOTE — Assessment & Plan Note (Signed)
Reviewed with patient PRN meds vis PPX meds - and discssed allopurinol is a PPX med, shouldn't take with acute gout flare.

## 2013-05-27 NOTE — Assessment & Plan Note (Signed)
Difficult story with multiple meds including meds he isn't sure of name, however clinical picture most consistent with drug rash to recent amoxicillin course.  Should improve with time as off med now.  Discussed prn use of benadryl.

## 2013-05-27 NOTE — Patient Instructions (Addendum)
I wonder about a drug reaction to amoxicillin. Should improve with time now that we're off med.   May try benadryl or claritin for itch. Let me know if any worsening.

## 2013-05-27 NOTE — Progress Notes (Signed)
BP 118/80  Pulse 71  Temp(Src) 98.2 F (36.8 C) (Oral)  Wt 251 lb 1.9 oz (113.907 kg)  SpO2 95%   CC: itchy rash  Subjective:    Patient ID: David [name], male    DOB: 1945/06/19, 68 y.o.   MRN: [id number]  HPI: David [name] is a 68 y.o. male presenting on 05/27/2013 for Rash   Pleasant 68 yo with h/o IBS, GERD, anxiety, gout, HLD HTN and afib presents with several week h/o congestion and cold sxs self treated with OTC meds.  Yesterday at 2pm noticed slightly pruritic rash on arms and abdomen and legs.  Leg rash has resolved, and today arm rash improving.  Known h/o xerosis. No fevers/chills, nausea, joint pains, oral lesions. No new lotions, detergents, soaps or shampoos.  Recent meds include: mucinex DM, allopurinol, medrol dose pack, colchicine, amoxicillin.  Recently seen for gout flare earlier this month at our office, prior at [facility] and treated with something else but pt is unsure what, but at our office treated with medrol dose pack and resumption of allopurinol.  has been taking allopurinol 300mg  prn.  Gout has improved after steroid course. Recently seen for otitis media as well on 05/12/2013 - treated with amoxicillin.  Past Medical History  Diagnosis Date  . LAFB (left anterior fascicular block)   . Diverticulitis   . Hypertension   . Premature ventricular complex   . Atrial fibrillation   . Hyperlipidemia   . Gout   . Anxiety   . Back pain   . Internal hemorrhoids   . GERD (gastroesophageal reflux disease)   . Diverticulosis   . BCC (basal cell carcinoma of skin)     under right eye and right ear  . Rectal irritation 09/18/2012  . Other malaise and fatigue 03/13/2013  . Tinea corporis 03/13/2013  . Abnormal liver function 03/18/2013  . IBS (irritable bowel syndrome) 03/18/2013     Relevant past medical, surgical, family and social history reviewed and updated as indicated.  Allergies and medications reviewed and updated. Current Outpatient Prescriptions on  File Prior to Visit  Medication Sig  . allopurinol (ZYLOPRIM) 300 MG tablet Take 1 tablet (300 mg total) by mouth daily as needed. Take as needed  . alum hydroxide-mag trisilicate (GAVISCON) 94-85 MG CHEW Chew by mouth.  Marland Kitchen aspirin 81 MG tablet Take 81 mg by mouth daily.  Marland Kitchen atenolol (TENORMIN) 25 MG tablet Take 1 tablet (25 mg total) by mouth 2 (two) times daily.  Marland Kitchen atorvastatin (LIPITOR) 40 MG tablet Take 20 mg by mouth 2 (two) times daily.   . colchicine 0.6 MG tablet Take 2 tablets by mouth at first sign of a gout attack, may take 1 tablet 1 hour later. Repeat daily until attack is resolved Only fill if patient requests  . cyclobenzaprine (FLEXERIL) 5 MG tablet Take 5 mg by mouth 3 (three) times daily as needed. For muscle spasms  . fluticasone (FLONASE) 50 MCG/ACT nasal spray Place 2 sprays into the nose daily as needed. For congestion  . HYDROcodone-acetaminophen (NORCO/VICODIN) 5-325 MG per tablet Take 1 tablet by mouth every 6 (six) hours as needed for pain.  Marland Kitchen ketoconazole (NIZORAL) 2 % cream As needed  . lisinopril (PRINIVIL,ZESTRIL) 5 MG tablet Take 2.5 mg by mouth 2 (two) times daily.  Marland Kitchen LORazepam (ATIVAN) 1 MG tablet Take 1 mg by mouth every 8 (eight) hours as needed for anxiety or sleep (Pt takes 0.5 tab in AM, takes 0.5 tab in  PM and 1 tablet at [facility]). For anxiety  . meloxicam (MOBIC) 15 MG tablet PRN As directed  . omeprazole (PRILOSEC) 10 MG capsule Take 10 mg by mouth daily as needed.   Marland Kitchen PROCTOZONE-HC 2.5 % rectal cream   . sertraline (ZOLOFT) 25 MG tablet Take 25 mg by mouth 2 (two) times daily. Patient takes 1/2 of 50 mg tablet BID  . triamcinolone (KENALOG) 0.025 % ointment Apply 1 application topically 2 (two) times daily.   No current facility-administered medications on file prior to visit.    Review of Systems Per HPI unless specifically indicated above    Objective:    BP 118/80  Pulse 71  Temp(Src) 98.2 F (36.8 C) (Oral)  Wt 251 lb 1.9 oz (113.907 kg)  SpO2  95%  Physical Exam  Nursing note and vitals reviewed. Constitutional: He appears well-developed and well-nourished. No distress.  HENT:  Mouth/Throat: Oropharynx is clear and moist. No oropharyngeal exudate.  No oral lesions  Musculoskeletal: He exhibits no edema.  Skin: Skin is warm and dry. Rash noted. There is erythema.  nonpruritic erythematous macular slightly papular rash diffusely on arms, abdomen, back, and legs, spares face and scalp       Assessment & Plan:   Problem List Items Addressed This Visit   Gout     Reviewed with patient PRN meds vis PPX meds - and discssed allopurinol is a PPX med, shouldn't take with acute gout flare.    Skin rash - Primary     Difficult story with multiple meds including meds he isn't sure of name, however clinical picture most consistent with drug rash to recent amoxicillin course.  Should improve with time as off med now.  Discussed prn use of benadryl.        Follow up plan: Return if symptoms worsen or fail to improve.

## 2013-06-01 ENCOUNTER — Other Ambulatory Visit: Payer: Self-pay | Admitting: Family Medicine

## 2013-06-02 NOTE — Telephone Encounter (Signed)
Ok to refill 

## 2013-06-07 ENCOUNTER — Telehealth: Payer: Self-pay | Admitting: Family Medicine

## 2013-06-07 NOTE — Telephone Encounter (Signed)
Received paperwork for PA on Colchicine, forward to nurse

## 2013-06-12 ENCOUNTER — Encounter: Payer: Self-pay | Admitting: Family Medicine

## 2013-06-12 ENCOUNTER — Ambulatory Visit (INDEPENDENT_AMBULATORY_CARE_PROVIDER_SITE_OTHER): Payer: Medicare Other | Admitting: Family Medicine

## 2013-06-12 ENCOUNTER — Telehealth: Payer: Self-pay | Admitting: Family Medicine

## 2013-06-12 VITALS — BP 118/76 | HR 70 | Temp 98.5°F | Ht 73.0 in | Wt 255.0 lb

## 2013-06-12 DIAGNOSIS — B354 Tinea corporis: Secondary | ICD-10-CM

## 2013-06-12 DIAGNOSIS — R945 Abnormal results of liver function studies: Secondary | ICD-10-CM

## 2013-06-12 DIAGNOSIS — I10 Essential (primary) hypertension: Secondary | ICD-10-CM

## 2013-06-12 DIAGNOSIS — E785 Hyperlipidemia, unspecified: Secondary | ICD-10-CM

## 2013-06-12 DIAGNOSIS — H60399 Other infective otitis externa, unspecified ear: Secondary | ICD-10-CM

## 2013-06-12 DIAGNOSIS — R5383 Other fatigue: Secondary | ICD-10-CM

## 2013-06-12 DIAGNOSIS — R05 Cough: Secondary | ICD-10-CM

## 2013-06-12 DIAGNOSIS — E663 Overweight: Secondary | ICD-10-CM

## 2013-06-12 DIAGNOSIS — K219 Gastro-esophageal reflux disease without esophagitis: Secondary | ICD-10-CM

## 2013-06-12 DIAGNOSIS — R5381 Other malaise: Secondary | ICD-10-CM

## 2013-06-12 DIAGNOSIS — M109 Gout, unspecified: Secondary | ICD-10-CM

## 2013-06-12 DIAGNOSIS — R059 Cough, unspecified: Secondary | ICD-10-CM

## 2013-06-12 DIAGNOSIS — K769 Liver disease, unspecified: Secondary | ICD-10-CM

## 2013-06-12 DIAGNOSIS — E559 Vitamin D deficiency, unspecified: Secondary | ICD-10-CM

## 2013-06-12 LAB — HEPATIC FUNCTION PANEL
ALT: 104 U/L — ABNORMAL HIGH (ref 0–53)
AST: 71 U/L — ABNORMAL HIGH (ref 0–37)
Albumin: 4.1 g/dL (ref 3.5–5.2)
Alkaline Phosphatase: 80 U/L (ref 39–117)
Bilirubin, Direct: 0.1 mg/dL (ref 0.0–0.3)
Indirect Bilirubin: 0.4 mg/dL (ref 0.2–1.2)
TOTAL PROTEIN: 6.6 g/dL (ref 6.0–8.3)
Total Bilirubin: 0.5 mg/dL (ref 0.2–1.2)

## 2013-06-12 LAB — RENAL FUNCTION PANEL
Albumin: 4.1 g/dL (ref 3.5–5.2)
BUN: 12 mg/dL (ref 6–23)
CALCIUM: 10.3 mg/dL (ref 8.4–10.5)
CO2: 20 mEq/L (ref 19–32)
CREATININE: 1.18 mg/dL (ref 0.50–1.35)
Chloride: 104 mEq/L (ref 96–112)
Glucose, Bld: 98 mg/dL (ref 70–99)
PHOSPHORUS: 2.7 mg/dL (ref 2.3–4.6)
Potassium: 4.1 mEq/L (ref 3.5–5.3)
Sodium: 138 mEq/L (ref 135–145)

## 2013-06-12 LAB — CBC
HCT: 42.4 % (ref 39.0–52.0)
Hemoglobin: 14.9 g/dL (ref 13.0–17.0)
MCH: 31.7 pg (ref 26.0–34.0)
MCHC: 35.1 g/dL (ref 30.0–36.0)
MCV: 90.2 fL (ref 78.0–100.0)
PLATELETS: 208 10*3/uL (ref 150–400)
RBC: 4.7 MIL/uL (ref 4.22–5.81)
RDW: 14.6 % (ref 11.5–15.5)
WBC: 5.6 10*3/uL (ref 4.0–10.5)

## 2013-06-12 LAB — URIC ACID: Uric Acid, Serum: 7.6 mg/dL (ref 4.0–7.8)

## 2013-06-12 LAB — TSH: TSH: 3.074 u[IU]/mL (ref 0.350–4.500)

## 2013-06-12 LAB — T4, FREE: FREE T4: 1.08 ng/dL (ref 0.80–1.80)

## 2013-06-12 MED ORDER — LOSARTAN POTASSIUM 25 MG PO TABS: 25.0000 mg | ORAL_TABLET | Freq: Every day | ORAL | Status: DC

## 2013-06-12 MED ORDER — NEOMYCIN-POLYMYXIN-HC 3.5-10000-1 OT SOLN: 3.0000 [drp] | Freq: Three times a day (TID) | OTIC | Status: DC

## 2013-06-12 MED ORDER — CLOTRIMAZOLE-BETAMETHASONE 1-0.05 % EX CREA: 1.0000 "application " | TOPICAL_CREAM | Freq: Two times a day (BID) | CUTANEOUS | Status: DC

## 2013-06-12 NOTE — Progress Notes (Signed)
Pre visit review using our clinic review tool, if applicable. No additional management support is needed unless otherwise documented below in the visit note. 

## 2013-06-12 NOTE — Patient Instructions (Addendum)
Daily Zyrtec/Cetirizine 10 mg daily for roughly the next month Phillip's Colon Health probiotic daily, Digestive Advantage or a generic  Allergies Allergies may happen from anything your body is sensitive to. This may be food, medicines, pollens, chemicals, and nearly anything around you in everyday life that produces allergens. An allergen is anything that causes an allergy producing substance. Heredity is often a factor in causing these problems. This means you may have some of the same allergies as your parents. Food allergies happen in all age groups. Food allergies are some of the most severe and life threatening. Some common food allergies are cow's milk, seafood, eggs, nuts, wheat, and soybeans. SYMPTOMS   Swelling around the mouth.  An itchy red rash or hives.  Vomiting or diarrhea.  Difficulty breathing. SEVERE ALLERGIC REACTIONS ARE LIFE-THREATENING. This reaction is called anaphylaxis. It can cause the mouth and throat to swell and cause difficulty with breathing and swallowing. In severe reactions only a trace amount of food (for example, peanut oil in a salad) may cause death within seconds. Seasonal allergies occur in all age groups. These are seasonal because they usually occur during the same season every year. They may be a reaction to molds, grass pollens, or tree pollens. Other causes of problems are house dust mite allergens, pet dander, and mold spores. The symptoms often consist of nasal congestion, a runny itchy nose associated with sneezing, and tearing itchy eyes. There is often an associated itching of the mouth and ears. The problems happen when you come in contact with pollens and other allergens. Allergens are the particles in the air that the body reacts to with an allergic reaction. This causes you to release allergic antibodies. Through a chain of events, these eventually cause you to release histamine into the blood stream. Although it is meant to be protective to  the body, it is this release that causes your discomfort. This is why you were given anti-histamines to feel better. If you are unable to pinpoint the offending allergen, it may be determined by skin or blood testing. Allergies cannot be cured but can be controlled with medicine. Hay fever is a collection of all or some of the seasonal allergy problems. It may often be treated with simple over-the-counter medicine such as diphenhydramine. Take medicine as directed. Do not drink alcohol or drive while taking this medicine. Check with your caregiver or package insert for child dosages. If these medicines are not effective, there are many new medicines your caregiver can prescribe. Stronger medicine such as nasal spray, eye drops, and corticosteroids may be used if the first things you try do not work well. Other treatments such as immunotherapy or desensitizing injections can be used if all else fails. Follow up with your caregiver if problems continue. These seasonal allergies are usually not life threatening. They are generally more of a nuisance that can often be handled using medicine. HOME CARE INSTRUCTIONS   If unsure what causes a reaction, keep a diary of foods eaten and symptoms that follow. Avoid foods that cause reactions.  If hives or rash are present:  Take medicine as directed.  You may use an over-the-counter antihistamine (diphenhydramine) for hives and itching as needed.  Apply cold compresses (cloths) to the skin or take baths in cool water. Avoid hot baths or showers. Heat will make a rash and itching worse.  If you are severely allergic:  Following a treatment for a severe reaction, hospitalization is often required for closer follow-up.  Wear  a medic-alert bracelet or necklace stating the allergy.  You and your family must learn how to give adrenaline or use an anaphylaxis kit.  If you have had a severe reaction, always carry your anaphylaxis kit or EpiPen with you. Use  this medicine as directed by your caregiver if a severe reaction is occurring. Failure to do so could have a fatal outcome. SEEK MEDICAL CARE IF:  You suspect a food allergy. Symptoms generally happen within 30 minutes of eating a food.  Your symptoms have not gone away within 2 days or are getting worse.  You develop new symptoms.  You want to retest yourself or your child with a food or drink you think causes an allergic reaction. Never do this if an anaphylactic reaction to that food or drink has happened before. Only do this under the care of a caregiver. SEEK IMMEDIATE MEDICAL CARE IF:   You have difficulty breathing, are wheezing, or have a tight feeling in your chest or throat.  You have a swollen mouth, or you have hives, swelling, or itching all over your body.  You have had a severe reaction that has responded to your anaphylaxis kit or an EpiPen. These reactions may return when the medicine has worn off. These reactions should be considered life threatening. MAKE SURE YOU:   Understand these instructions.  Will watch your condition.  Will get help right away if you are not doing well or get worse. Document Released: 03/24/2002 Document Revised: 04/25/2012 Document Reviewed: 08/29/2007 [facility] Patient Information 2014 [place].

## 2013-06-12 NOTE — Telephone Encounter (Signed)
Relevant patient education mailed to patient.  

## 2013-06-13 LAB — URINALYSIS
Bilirubin Urine: NEGATIVE
Glucose, UA: NEGATIVE mg/dL
HGB URINE DIPSTICK: NEGATIVE
Ketones, ur: NEGATIVE mg/dL
LEUKOCYTES UA: NEGATIVE
NITRITE: NEGATIVE
Protein, ur: NEGATIVE mg/dL
SPECIFIC GRAVITY, URINE: 1.015 (ref 1.005–1.030)
Urobilinogen, UA: 0.2 mg/dL (ref 0.0–1.0)
pH: 5 (ref 5.0–8.0)

## 2013-06-13 LAB — VITAMIN D 25 HYDROXY (VIT D DEFICIENCY, FRACTURES): Vit D, 25-Hydroxy: 28 ng/mL — ABNORMAL LOW (ref 30–89)

## 2013-06-14 ENCOUNTER — Telehealth: Payer: Self-pay | Admitting: *Deleted

## 2013-06-14 DIAGNOSIS — R945 Abnormal results of liver function studies: Principal | ICD-10-CM

## 2013-06-14 DIAGNOSIS — R7989 Other specified abnormal findings of blood chemistry: Secondary | ICD-10-CM

## 2013-06-14 LAB — URINE CULTURE
Colony Count: NO GROWTH
Organism ID, Bacteria: NO GROWTH

## 2013-06-14 NOTE — Telephone Encounter (Signed)
Message copied by Ronny Flurry on Wed Jun 14, 2013 10:40 AM ------      Message from: Penni Homans A      Created: Tue Jun 13, 2013 10:04 PM       NOTIFY LABS are negative except liver functions are up. Minimize simple carbs and saturated fats. Check a liver panel in 1-2  Weeks with an acute hepatitis panel and then ultrasound if negative and lfts are still up ------

## 2013-06-14 NOTE — Telephone Encounter (Signed)
Notified pt and he voices understanding. Lab orders entered.

## 2013-06-15 ENCOUNTER — Ambulatory Visit: Payer: Self-pay | Admitting: Family Medicine

## 2013-06-15 ENCOUNTER — Telehealth: Payer: Self-pay

## 2013-06-15 NOTE — Telephone Encounter (Signed)
PA for Colchicine has been given to md

## 2013-06-18 ENCOUNTER — Encounter: Payer: Self-pay | Admitting: Family Medicine

## 2013-06-18 DIAGNOSIS — E559 Vitamin D deficiency, unspecified: Secondary | ICD-10-CM | POA: Insufficient documentation

## 2013-06-18 NOTE — Assessment & Plan Note (Signed)
Encouraged DASH diet, decrease po intake and increase exercise as tolerated. Needs 7-8 hours of sleep nightly. Avoid trans fats, eat small, frequent meals every 4-5 hours with lean proteins, complex carbs and healthy fats. Minimize simple carbs 

## 2013-06-18 NOTE — Assessment & Plan Note (Signed)
Mild on check today, needs otc supplement recommend start Vitamin D 2000 IU daily

## 2013-06-18 NOTE — Assessment & Plan Note (Signed)
Avoid offending foods, start probiotics. Do not eat large meals in late evening and consider raising head of bed.  

## 2013-06-18 NOTE — Assessment & Plan Note (Signed)
Well controlled, no changes to meds. Encouraged heart healthy diet such as the DASH diet and exercise as tolerated.  °

## 2013-06-18 NOTE — Assessment & Plan Note (Signed)
Tolerating statin, encouraged heart healthy diet, avoid trans fats, minimize simple carbs and saturated fats. Increase exercise as tolerated 

## 2013-06-18 NOTE — Assessment & Plan Note (Signed)
Likely multifactorial, start an antihistamine daily to address allergies. Avoid offending foods, start probiotics. Do not eat large meals in late evening and consider raising head of bed to minimize reflux symptoms.

## 2013-06-18 NOTE — Assessment & Plan Note (Addendum)
Did not tolerate Allopurinol, maintain adequate hydration, report symptoms

## 2013-06-18 NOTE — Assessment & Plan Note (Signed)
Encouraged DASH diet, decrease po intake and increase exercise as tolerated. Needs 7-8 hours of sleep nightly. Avoid trans fats, eat small, frequent meals every 4-5 hours with lean proteins, complex carbs and healthy fats. Minimize simple carbs, GMO foods. 

## 2013-06-18 NOTE — Progress Notes (Signed)
Patient ID: David [name], male   DOB: 1945/08/23, 68 y.o.   MRN: [id number] David [name] [id number] 06/27/1945 06/18/2013      Progress Note-Follow Up  Subjective  Chief Complaint  Chief Complaint  Patient presents with  . Follow-up    3 month- cough won't go away   . Fatigue    HPI  Patient is a 68 year old male in today for routine medical care. He is usually in followup in is struggling with fatigue and cough. His cough is generally mild and occasional productive of clear phlegm. Does not awaken him at night. He's had no recent fevers or illness. He notes he has stopped drinking alcohol and has changed his diet sodas lost a small amount of weight. No recent illness otherwise. Has complained of some mild popping and pressure in his years recently. Tried some Mucinex DM but developed a rash so stopped it. Stopped allopurinol due to rash. Denies CP/palp/SOB/HA/fevers/GI or GU c/o. Taking meds as prescribed  Past Medical History  Diagnosis Date  . LAFB (left anterior fascicular block)   . Diverticulitis   . Hypertension   . Premature ventricular complex   . Atrial fibrillation   . Hyperlipidemia   . Gout   . Anxiety   . Back pain   . Internal hemorrhoids   . GERD (gastroesophageal reflux disease)   . Diverticulosis   . BCC (basal cell carcinoma of skin)     under right eye and right ear  . Rectal irritation 09/18/2012  . Other malaise and fatigue 03/13/2013  . Tinea corporis 03/13/2013  . Abnormal liver function 03/18/2013  . IBS (irritable bowel syndrome) 03/18/2013    Past Surgical History  Procedure Laterality Date  . Nose surgery    . Colonoscopy    . Colon surgery  2009    segmental sigmoid resection  . Skin biopsy      Family History  Problem Relation Age of Onset  . Heart disease Mother   . Cancer Mother     colon, breast  . Hypertension Mother   . Stroke Mother   . Heart disease Father     pacemaker  . Aortic aneurysm Father   . Hypertension Father   .  Heart disease Sister   . Atrial fibrillation Sister   . Obesity Sister   . Sleep apnea Sister   . Heart attack Brother   . Other Brother     muscle disease  . Arthritis Brother   . Cancer Maternal Grandmother     ?  Marland Kitchen Heart attack Maternal Grandmother   . Diabetes Maternal Grandmother   . Cancer Maternal Grandfather     hodgin's lymphoma  . Heart attack Paternal Grandmother   . Anxiety disorder Paternal Grandmother   . Pneumonia Paternal Grandfather   . Heart attack Brother   . Atrial fibrillation Brother   . Heart attack Brother   . Hypertension Brother   . Hyperlipidemia Brother   . Heart attack Brother   . Other Brother     heart valve operation    History   Social History  . Marital Status: Married    Spouse Name: N/A    Number of Children: 3  . Years of Education: N/A   Occupational History  . [employer]    Social History Main Topics  . Smoking status: Former Smoker -- 1.00 packs/day for 6 years    Types: Cigarettes    Start date: 01/12/1969  . Smokeless  tobacco: Never Used  . Alcohol Use: Yes     Comment: 2 drinks every night  . Drug Use: No  . Sexual Activity: Yes   Other Topics Concern  . Not on file   Social History Narrative  . No narrative on file    Current Outpatient Prescriptions on File Prior to Visit  Medication Sig Dispense Refill  . alum hydroxide-mag trisilicate (GAVISCON) 58-52 MG CHEW Chew by mouth.      Marland Kitchen aspirin 81 MG tablet Take 81 mg by mouth daily.      Marland Kitchen atenolol (TENORMIN) 25 MG tablet Take 1 tablet (25 mg total) by mouth 2 (two) times daily.  60 tablet  3  . atorvastatin (LIPITOR) 40 MG tablet Take 20 mg by mouth 2 (two) times daily.       Marland Kitchen COLCRYS 0.6 MG tablet TAKE 2 TABLETS BY MOUTH AT FIRST SIGN OF GOUT ATTACK, MAY TAKE 1 TABLET 1 HOUR LATER. REPEAT DAILY UNTIL ATTACK IS RESOLVED.  30 tablet  0  . cyclobenzaprine (FLEXERIL) 5 MG tablet Take 5 mg by mouth 3 (three) times daily as needed. For muscle spasms      . fluticasone  (FLONASE) 50 MCG/ACT nasal spray Place 2 sprays into the nose daily as needed. For congestion      . HYDROcodone-acetaminophen (NORCO/VICODIN) 5-325 MG per tablet Take 1 tablet by mouth every 6 (six) hours as needed for pain.  40 tablet  1  . ketoconazole (NIZORAL) 2 % cream As needed      . LORazepam (ATIVAN) 1 MG tablet Take 1 mg by mouth every 8 (eight) hours as needed for anxiety or sleep (Pt takes 0.5 tab in AM, takes 0.5 tab in PM and 1 tablet at [facility]). For anxiety      . meloxicam (MOBIC) 15 MG tablet PRN As directed      . omeprazole (PRILOSEC) 10 MG capsule Take 10 mg by mouth daily as needed.       Marland Kitchen PROCTOZONE-HC 2.5 % rectal cream Place 1 application rectally as needed.       . sertraline (ZOLOFT) 25 MG tablet Take 25 mg by mouth 2 (two) times daily. Patient takes 1/2 of 50 mg tablet BID      . triamcinolone (KENALOG) 0.025 % ointment Apply 1 application topically 2 (two) times daily.  30 g  0   No current facility-administered medications on file prior to visit.    Allergies  Allergen Reactions  . Cefaclor Hives  . Cephalosporins Hives  . Penicillins Rash    Mild maculopapular rash    Review of Systems  Review of Systems  Constitutional: Positive for malaise/fatigue. Negative for fever.  HENT: Positive for congestion.   Eyes: Negative for discharge.  Respiratory: Positive for cough. Negative for shortness of breath.   Cardiovascular: Negative for chest pain, palpitations and leg swelling.  Gastrointestinal: Negative for nausea, abdominal pain and diarrhea.  Genitourinary: Negative for dysuria.  Musculoskeletal: Negative for falls.  Skin: Negative for rash.  Neurological: Negative for loss of consciousness and headaches.  Endo/Heme/Allergies: Negative for polydipsia.  Psychiatric/Behavioral: Negative for depression and suicidal ideas. The patient is not nervous/anxious and does not have insomnia.     Objective  BP 118/76  Pulse 70  Temp(Src) 98.5 F (36.9 C)  (Oral)  Ht 6\' 1"  (1.854 m)  Wt 255 lb (115.667 kg)  BMI 33.65 kg/m2  SpO2 97%  Physical Exam  Physical Exam  Constitutional: He is oriented to  person, place, and time and well-developed, well-nourished, and in no distress. No distress.  HENT:  Head: Normocephalic and atraumatic.  Eyes: Conjunctivae are normal.  Neck: Neck supple. No thyromegaly present.  Cardiovascular: Normal rate and normal heart sounds.   Pulmonary/Chest: Effort normal and breath sounds normal. No respiratory distress.  Abdominal: He exhibits no distension and no mass. There is no tenderness.  Musculoskeletal: He exhibits no edema.  Neurological: He is alert and oriented to person, place, and time.  Skin: Skin is warm.  Psychiatric: Memory, affect and judgment normal.    Lab Results  Component Value Date   TSH 3.074 06/12/2013   Lab Results  Component Value Date   WBC 5.6 06/12/2013   HGB 14.9 06/12/2013   HCT 42.4 06/12/2013   MCV 90.2 06/12/2013   PLT 208 06/12/2013   Lab Results  Component Value Date   CREATININE 1.18 06/12/2013   BUN 12 06/12/2013   NA 138 06/12/2013   K 4.1 06/12/2013   CL 104 06/12/2013   CO2 20 06/12/2013   Lab Results  Component Value Date   ALT 104* 06/12/2013   AST 71* 06/12/2013   ALKPHOS 80 06/12/2013   BILITOT 0.5 06/12/2013   Lab Results  Component Value Date   CHOL 133 03/13/2013   Lab Results  Component Value Date   HDL 35* 03/13/2013   Lab Results  Component Value Date   LDLCALC 81 03/13/2013   Lab Results  Component Value Date   TRIG 85 03/13/2013   Lab Results  Component Value Date   CHOLHDL 3.8 03/13/2013     Assessment & Plan  HYPERLIPIDEMIA Tolerating statin, encouraged heart healthy diet, avoid trans fats, minimize simple carbs and saturated fats. Increase exercise as tolerated  Essential hypertension, benign Well controlled, no changes to meds. Encouraged heart healthy diet such as the DASH diet and exercise as tolerated.   GERD Avoid offending foods, start  probiotics. Do not eat large meals in late evening and consider raising head of bed.   OVERWEIGHT Encouraged DASH diet, decrease po intake and increase exercise as tolerated. Needs 7-8 hours of sleep nightly. Avoid trans fats, eat small, frequent meals every 4-5 hours with lean proteins, complex carbs and healthy fats. Minimize simple carbs, GMO foods.  Vitamin D deficiency Mild on check today, needs otc supplement recommend start Vitamin D 2000 IU daily  Gout Did not tolerate Allopurinol, maintain adequate hydration, report symptoms  Abnormal liver function Encouraged DASH diet, decrease po intake and increase exercise as tolerated. Needs 7-8 hours of sleep nightly. Avoid trans fats, eat small, frequent meals every 4-5 hours with lean proteins, complex carbs and healthy fats. Minimize simple carbs  Cough Likely multifactorial, start an antihistamine daily to address allergies. Avoid offending foods, start probiotics. Do not eat large meals in late evening and consider raising head of bed to minimize reflux symptoms.

## 2013-06-19 ENCOUNTER — Telehealth: Payer: Self-pay

## 2013-06-19 NOTE — Telephone Encounter (Signed)
Patient informed and voiced understanding

## 2013-06-19 NOTE — Telephone Encounter (Signed)
Message copied by Varney Daily on Mon Jun 19, 2013  7:09 AM ------      Message from: Penni Homans A      Created: Sun Jun 18, 2013 11:13 AM       I just want to make sure he knows his vitamin D was mildly low. Start Vitamin D 2000 IU daily til we recheck ------

## 2013-07-03 LAB — HEPATIC FUNCTION PANEL
ALBUMIN: 3.9 g/dL (ref 3.5–5.2)
ALT: 31 U/L (ref 0–53)
AST: 27 U/L (ref 0–37)
Alkaline Phosphatase: 75 U/L (ref 39–117)
Bilirubin, Direct: 0.1 mg/dL (ref 0.0–0.3)
Indirect Bilirubin: 0.5 mg/dL (ref 0.2–1.2)
TOTAL PROTEIN: 6.1 g/dL (ref 6.0–8.3)
Total Bilirubin: 0.6 mg/dL (ref 0.2–1.2)

## 2013-07-04 LAB — HEPATITIS PANEL, ACUTE
HCV AB: NEGATIVE
HEP B C IGM: NONREACTIVE
Hep A IgM: NONREACTIVE
Hepatitis B Surface Ag: NEGATIVE

## 2013-07-07 ENCOUNTER — Encounter: Payer: Self-pay | Admitting: Family Medicine

## 2013-08-01 ENCOUNTER — Encounter: Payer: Self-pay | Admitting: Family Medicine

## 2013-08-01 ENCOUNTER — Ambulatory Visit (INDEPENDENT_AMBULATORY_CARE_PROVIDER_SITE_OTHER): Payer: Medicare Other | Admitting: Family Medicine

## 2013-08-01 VITALS — BP 135/84 | HR 63 | Temp 97.4°F | Ht 73.0 in | Wt 259.0 lb

## 2013-08-01 DIAGNOSIS — R52 Pain, unspecified: Secondary | ICD-10-CM

## 2013-08-01 DIAGNOSIS — M109 Gout, unspecified: Secondary | ICD-10-CM | POA: Insufficient documentation

## 2013-08-01 MED ORDER — COLCHICINE 0.6 MG PO TABS: ORAL_TABLET | ORAL | Status: DC

## 2013-08-01 MED ORDER — HYDROCODONE-ACETAMINOPHEN 5-325 MG PO TABS: 1.0000 | ORAL_TABLET | Freq: Four times a day (QID) | ORAL | Status: DC | PRN

## 2013-08-01 MED ORDER — FEBUXOSTAT 40 MG PO TABS
40.0000 mg | ORAL_TABLET | Freq: Every day | ORAL | Status: DC
Start: 2013-08-01 — End: 2014-03-23

## 2013-08-01 NOTE — Progress Notes (Signed)
OFFICE VISIT  08/01/2013   CC:  Chief Complaint  Patient presents with  . Gout   HPI:    Patient is a 68 y.o. Caucasian male who presents for "gout".  Hx of not tolerating allopurinol. Onset of pain in left foot this morning, took his colcrys 2 tabs and 1 an hour later.  It has been about 5 hours and he feels no significant change yet (5 hours since colchicine).  He occ uses vicodin for pain with these attacks, took his last 1/2 of vicodin pill today, says he has only used 60 in the last 6 years. He is in favor of trying uloric. Most recent gout flare requiring colchicine was 6-8 mo ago.   Past Medical History  Diagnosis Date  . LAFB (left anterior fascicular block)   . Diverticulitis   . Hypertension   . Premature ventricular complex   . Atrial fibrillation   . Hyperlipidemia   . Gout   . Anxiety   . Back pain   . Internal hemorrhoids   . GERD (gastroesophageal reflux disease)   . Diverticulosis   . BCC (basal cell carcinoma of skin)     under right eye and right ear  . Rectal irritation 09/18/2012  . Other malaise and fatigue 03/13/2013  . Tinea corporis 03/13/2013  . Abnormal liver function 03/18/2013  . IBS (irritable bowel syndrome) 03/18/2013    Past Surgical History  Procedure Laterality Date  . Nose surgery    . Colonoscopy    . Colon surgery  2009    segmental sigmoid resection  . Skin biopsy      Outpatient Prescriptions Prior to Visit  Medication Sig Dispense Refill  . alum hydroxide-mag trisilicate (GAVISCON) 54-27 MG CHEW Chew by mouth.      Marland Kitchen aspirin 81 MG tablet Take 81 mg by mouth daily.      Marland Kitchen atenolol (TENORMIN) 25 MG tablet Take 1 tablet (25 mg total) by mouth 2 (two) times daily.  60 tablet  3  . atorvastatin (LIPITOR) 40 MG tablet Take 20 mg by mouth 2 (two) times daily.       . cyclobenzaprine (FLEXERIL) 5 MG tablet Take 5 mg by mouth 3 (three) times daily as needed. For muscle spasms      . fluticasone (FLONASE) 50 MCG/ACT nasal spray Place 2  sprays into the nose daily as needed. For congestion      . LORazepam (ATIVAN) 1 MG tablet Take 1 mg by mouth every 8 (eight) hours as needed for anxiety or sleep (Pt takes 0.5 tab in AM, takes 0.5 tab in PM and 1 tablet at [facility]). For anxiety      . losartan (COZAAR) 25 MG tablet Take 1 tablet (25 mg total) by mouth daily.  30 tablet  5  . meloxicam (MOBIC) 15 MG tablet PRN As directed      . neomycin-polymyxin-hydrocortisone (CORTISPORIN) otic solution Place 3 drops into both ears 3 (three) times daily.  10 mL  0  . omeprazole (PRILOSEC) 10 MG capsule Take 10 mg by mouth daily as needed.       Marland Kitchen PROCTOZONE-HC 2.5 % rectal cream Place 1 application rectally as needed.       . sertraline (ZOLOFT) 25 MG tablet Take 25 mg by mouth 2 (two) times daily. Patient takes 1/2 of 50 mg tablet BID      . triamcinolone (KENALOG) 0.025 % ointment Apply 1 application topically 2 (two) times daily.  30 g  0  . COLCRYS 0.6 MG tablet TAKE 2 TABLETS BY MOUTH AT FIRST SIGN OF GOUT ATTACK, MAY TAKE 1 TABLET 1 HOUR LATER. REPEAT DAILY UNTIL ATTACK IS RESOLVED.  30 tablet  0  . HYDROcodone-acetaminophen (NORCO/VICODIN) 5-325 MG per tablet Take 1 tablet by mouth every 6 (six) hours as needed for pain.  40 tablet  1  . clotrimazole-betamethasone (LOTRISONE) cream Apply 1 application topically 2 (two) times daily.  30 g  0  . ketoconazole (NIZORAL) 2 % cream As needed       No facility-administered medications prior to visit.    Allergies  Allergen Reactions  . Cefaclor Hives  . Cephalosporins Hives  . Penicillins Rash    Mild maculopapular rash    ROS As per HPI  PE: Blood pressure 135/84, pulse 63, temperature 97.4 F (36.3 C), temperature source Temporal, height 6\' 1"  (1.854 m), weight 259 lb (117.482 kg), SpO2 96.00%. Gen: Alert, well appearing.  Patient is oriented to person, place, time, and situation. Left MTP mildly swollen, tender, and warm. Mild erythema to this joint.  He can move foot and toes w/out  problem.   LABS:  NONE TODAY. Recent:  Uric acid level was 7.6 on 06/12/13   Chemistry      Component Value Date/Time   NA 138 06/12/2013 0836   K 4.1 06/12/2013 0836   CL 104 06/12/2013 0836   CO2 20 06/12/2013 0836   BUN 12 06/12/2013 0836   CREATININE 1.18 06/12/2013 0836   CREATININE 1.30 03/02/2011 0930      Component Value Date/Time   CALCIUM 10.3 06/12/2013 0836   ALKPHOS 75 07/03/2013 0801   AST 27 07/03/2013 0801   ALT 31 07/03/2013 0801   BILITOT 0.6 07/03/2013 0801       IMPRESSION AND PLAN:  Gouty arthritis of toe of left foot Start uloric 40mg  po qd. Start colchicine 0.6mg  bid as "prophylactic" gout med for 1 mo. If this is not sufficient as he gets his uloric into his system then he'll call and we'll have to get him on a steroid taper. Vicodin 5/325, 1 q6h prn pain, #30, no RF. Needs recheck Uric acid level in 2 wks. F/u with Dr. Charlett Blake in 1-2 months.   An After Visit Summary was printed and given to the patient.  FOLLOW UP: Return for lab visit for uric acid level in 2 wks, office f/u with Dr. Charlett Blake in 1-2 months.Marland Kitchen

## 2013-08-01 NOTE — Assessment & Plan Note (Signed)
Start uloric 40mg  po qd. Start colchicine 0.6mg  bid as "prophylactic" gout med for 1 mo. If this is not sufficient as he gets his uloric into his system then he'll call and we'll have to get him on a steroid taper. Vicodin 5/325, 1 q6h prn pain, #30, no RF. Needs recheck Uric acid level in 2 wks. F/u with Dr. Charlett Blake in 1-2 months.

## 2013-08-01 NOTE — Patient Instructions (Signed)
Start uloric 40mg  once daily. Take colchicine 0.6mg  tab twice a day for 1 month. Call if gout attack is not improving in 1-2 days or if you are getting recurrent gout flares over the next 1 month.  You need to get your uric acid level rechecked in 2 wks.

## 2013-08-01 NOTE — Progress Notes (Signed)
Pre visit review using our clinic review tool, if applicable. No additional management support is needed unless otherwise documented below in the visit note. 

## 2013-09-06 ENCOUNTER — Other Ambulatory Visit: Payer: Self-pay | Admitting: Dermatology

## 2013-09-19 ENCOUNTER — Other Ambulatory Visit: Payer: Self-pay | Admitting: Family Medicine

## 2013-09-19 NOTE — Telephone Encounter (Signed)
Please advise refill? I don't see where md has refilled this?

## 2013-09-20 NOTE — Telephone Encounter (Signed)
RX faxed

## 2013-09-25 ENCOUNTER — Ambulatory Visit (INDEPENDENT_AMBULATORY_CARE_PROVIDER_SITE_OTHER): Payer: Medicare Other | Admitting: Family Medicine

## 2013-09-25 ENCOUNTER — Encounter: Payer: Self-pay | Admitting: Family Medicine

## 2013-09-25 VITALS — BP 136/77 | HR 64 | Temp 98.6°F | Ht 73.0 in | Wt 262.4 lb

## 2013-09-25 DIAGNOSIS — K219 Gastro-esophageal reflux disease without esophagitis: Secondary | ICD-10-CM

## 2013-09-25 DIAGNOSIS — F341 Dysthymic disorder: Secondary | ICD-10-CM

## 2013-09-25 DIAGNOSIS — K5900 Constipation, unspecified: Secondary | ICD-10-CM

## 2013-09-25 DIAGNOSIS — E663 Overweight: Secondary | ICD-10-CM

## 2013-09-25 DIAGNOSIS — Z23 Encounter for immunization: Secondary | ICD-10-CM

## 2013-09-25 DIAGNOSIS — E559 Vitamin D deficiency, unspecified: Secondary | ICD-10-CM

## 2013-09-25 DIAGNOSIS — F418 Other specified anxiety disorders: Secondary | ICD-10-CM | POA: Insufficient documentation

## 2013-09-25 DIAGNOSIS — M109 Gout, unspecified: Secondary | ICD-10-CM

## 2013-09-25 DIAGNOSIS — I10 Essential (primary) hypertension: Secondary | ICD-10-CM

## 2013-09-25 DIAGNOSIS — N289 Disorder of kidney and ureter, unspecified: Secondary | ICD-10-CM

## 2013-09-25 LAB — RENAL FUNCTION PANEL
ALBUMIN: 3.9 g/dL (ref 3.5–5.2)
BUN: 11 mg/dL (ref 6–23)
CO2: 22 mEq/L (ref 19–32)
Calcium: 9.4 mg/dL (ref 8.4–10.5)
Chloride: 106 mEq/L (ref 96–112)
Creatinine, Ser: 1.2 mg/dL (ref 0.4–1.5)
GFR: 63.92 mL/min (ref >60.00)
GLUCOSE: 96 mg/dL (ref 70–99)
PHOSPHORUS: 3.1 mg/dL (ref 2.3–4.6)
Potassium: 4 mEq/L (ref 3.5–5.1)
Sodium: 139 mEq/L (ref 135–145)

## 2013-09-25 LAB — LIPID PANEL
CHOL/HDL RATIO: 4
Cholesterol: 135 mg/dL (ref 0–200)
HDL: 31.6 mg/dL — AB (ref >39.00)
LDL Cholesterol: 83 mg/dL (ref 0–99)
NONHDL: 103.4
Triglycerides: 101 mg/dL (ref 0.0–149.0)
VLDL: 20.2 mg/dL (ref 0.0–40.0)

## 2013-09-25 LAB — HEPATIC FUNCTION PANEL
ALBUMIN: 3.9 g/dL (ref 3.5–5.2)
ALK PHOS: 72 U/L (ref 39–117)
ALT: 50 U/L (ref 0–53)
AST: 33 U/L (ref 0–37)
Bilirubin, Direct: 0.1 mg/dL (ref 0.0–0.3)
TOTAL PROTEIN: 7.1 g/dL (ref 6.0–8.3)
Total Bilirubin: 0.7 mg/dL (ref 0.2–1.2)

## 2013-09-25 LAB — CBC
HCT: 44.5 % (ref 39.0–52.0)
HEMOGLOBIN: 14.6 g/dL (ref 13.0–17.0)
MCHC: 32.7 g/dL (ref 30.0–36.0)
MCV: 95.2 fl (ref 78.0–100.0)
PLATELETS: 204 10*3/uL (ref 150.0–400.0)
RBC: 4.68 Mil/uL (ref 4.22–5.81)
RDW: 14.5 % (ref 11.5–15.5)
WBC: 6.7 10*3/uL (ref 4.0–10.5)

## 2013-09-25 LAB — URIC ACID: URIC ACID, SERUM: 4.5 mg/dL (ref 4.0–7.8)

## 2013-09-25 LAB — TSH: TSH: 2.27 u[IU]/mL (ref 0.35–4.50)

## 2013-09-25 LAB — VITAMIN D 25 HYDROXY (VIT D DEFICIENCY, FRACTURES): VITD: 38.02 ng/mL (ref 30.00–100.00)

## 2013-09-25 MED ORDER — SERTRALINE HCL 50 MG PO TABS: ORAL_TABLET | ORAL | Status: DC

## 2013-09-25 NOTE — Assessment & Plan Note (Signed)
resolved 

## 2013-09-25 NOTE — Assessment & Plan Note (Signed)
Well controlled, no changes to meds. Encouraged heart healthy diet such as the DASH diet and exercise as tolerated.  °

## 2013-09-25 NOTE — Assessment & Plan Note (Signed)
Avoid offending foods, start probiotics. Do not eat large meals in late evening and consider raising head of bed.  

## 2013-09-25 NOTE — Progress Notes (Signed)
Patient ID: MASSIMO HARTLAND, male   DOB: August 20, 1945, 68 y.o.   MRN: [id number] REMIGIO MCMILLON [id number] 06-06-1945 09/25/2013      Progress Note-Follow Up  Subjective  Chief Complaint  Chief Complaint  Patient presents with  . Follow-up    3 month  . Injections    flu    HPI  Patient is a 68 year old male in today for routine medical care. In today in fair condition. Is still struggling with chronic headaches, joint pain, especially the knees, fatigue. Nothing escalated. He does note his mood is somewhat depressed but he denies suicidal ideation. Is struggling with great deal of stress with his wife's illness. Is noting recent constipation with straining to move his bowels every 3-4 days. No bloody or tarry stool. Gout is doing better with her. Denies CP/palp/SOB/HA/congestion/fevers/GI or GU c/o. Taking meds as prescribed  Past Medical History  Diagnosis Date  . LAFB (left anterior fascicular block)   . Diverticulitis   . Hypertension   . Premature ventricular complex   . Atrial fibrillation   . Hyperlipidemia   . Gout   . Anxiety   . Back pain   . Internal hemorrhoids   . GERD (gastroesophageal reflux disease)   . Diverticulosis   . BCC (basal cell carcinoma of skin)     under right eye and right ear  . Rectal irritation 09/18/2012  . Other malaise and fatigue 03/13/2013  . Tinea corporis 03/13/2013  . Abnormal liver function 03/18/2013  . IBS (irritable bowel syndrome) 03/18/2013    Past Surgical History  Procedure Laterality Date  . Nose surgery    . Colonoscopy    . Colon surgery  2009    segmental sigmoid resection  . Skin biopsy      Family History  Problem Relation Age of Onset  . Heart disease Mother   . Cancer Mother     colon, breast  . Hypertension Mother   . Stroke Mother   . Heart disease Father     pacemaker  . Aortic aneurysm Father   . Hypertension Father   . Heart disease Sister   . Atrial fibrillation Sister   . Obesity Sister   . Sleep  apnea Sister   . Heart attack Brother   . Other Brother     muscle disease  . Arthritis Brother   . Cancer Maternal Grandmother     ?  Marland Kitchen Heart attack Maternal Grandmother   . Diabetes Maternal Grandmother   . Cancer Maternal Grandfather     hodgin's lymphoma  . Heart attack Paternal Grandmother   . Anxiety disorder Paternal Grandmother   . Pneumonia Paternal Grandfather   . Heart attack Brother   . Atrial fibrillation Brother   . Heart attack Brother   . Hypertension Brother   . Hyperlipidemia Brother   . Heart attack Brother   . Other Brother     heart valve operation    History   Social History  . Marital Status: Married    Spouse Name: N/A    Number of Children: 3  . Years of Education: N/A   Occupational History  . [employer]    Social History Main Topics  . Smoking status: Former Smoker -- 1.00 packs/day for 6 years    Types: Cigarettes    Start date: 01/12/1969  . Smokeless tobacco: Never Used  . Alcohol Use: Yes     Comment: 2 drinks every night  . Drug Use:  No  . Sexual Activity: Yes   Other Topics Concern  . Not on file   Social History Narrative  . No narrative on file    Current Outpatient Prescriptions on File Prior to Visit  Medication Sig Dispense Refill  . alum hydroxide-mag trisilicate (GAVISCON) 02-54 MG CHEW Chew by mouth.      Marland Kitchen aspirin 81 MG tablet Take 81 mg by mouth daily.      Marland Kitchen atenolol (TENORMIN) 25 MG tablet Take 1 tablet (25 mg total) by mouth 2 (two) times daily.  60 tablet  3  . atorvastatin (LIPITOR) 40 MG tablet Take 20 mg by mouth 2 (two) times daily.       . colchicine (COLCRYS) 0.6 MG tablet 1 tab po bid x 30d  60 tablet  0  . colchicine (COLCRYS) 0.6 MG tablet TAKE 2 TABLETS BY MOUTH AT FIRST SIGN OF GOUT ATTACK, MAY TAKE 1 TABLET 1 HOUR LATER. REPEAT DAILY UNTIL ATTACK IS RESOLVED.  30 tablet  6  . cyclobenzaprine (FLEXERIL) 5 MG tablet Take 5 mg by mouth 3 (three) times daily as needed. For muscle spasms      . febuxostat  (ULORIC) 40 MG tablet Take 1 tablet (40 mg total) by mouth daily.  30 tablet  6  . fluticasone (FLONASE) 50 MCG/ACT nasal spray Place 2 sprays into the nose daily as needed. For congestion      . HYDROcodone-acetaminophen (NORCO/VICODIN) 5-325 MG per tablet Take 1 tablet by mouth every 6 (six) hours as needed.  30 tablet  0  . LORazepam (ATIVAN) 1 MG tablet TAKE 1 TABLET BY MOUTH EVERY 8 HOURS AS NEEDED FOR ANXIETY OR SLEEP  90 tablet  0  . losartan (COZAAR) 25 MG tablet Take 1 tablet (25 mg total) by mouth daily.  30 tablet  5  . meloxicam (MOBIC) 15 MG tablet PRN As directed      . neomycin-polymyxin-hydrocortisone (CORTISPORIN) otic solution Place 3 drops into both ears 3 (three) times daily.  10 mL  0  . omeprazole (PRILOSEC) 10 MG capsule Take 10 mg by mouth daily as needed.       Marland Kitchen PROCTOZONE-HC 2.5 % rectal cream Place 1 application rectally as needed.       . sertraline (ZOLOFT) 25 MG tablet Take 25 mg by mouth 2 (two) times daily. Patient takes 1/2 of 50 mg tablet BID      . triamcinolone (KENALOG) 0.025 % ointment Apply 1 application topically 2 (two) times daily.  30 g  0   No current facility-administered medications on file prior to visit.    Allergies  Allergen Reactions  . Cefaclor Hives  . Cephalosporins Hives  . Penicillins Rash    Mild maculopapular rash    Review of Systems  Review of Systems  Constitutional: Negative for fever and malaise/fatigue.  HENT: Negative for congestion.   Eyes: Negative for discharge.  Respiratory: Negative for shortness of breath.   Cardiovascular: Negative for chest pain, palpitations and leg swelling.  Gastrointestinal: Positive for constipation. Negative for nausea, abdominal pain, diarrhea, blood in stool and melena.  Genitourinary: Negative for dysuria.  Musculoskeletal: Positive for joint pain. Negative for falls.  Skin: Negative for rash.  Neurological: Positive for headaches. Negative for loss of consciousness.   Endo/Heme/Allergies: Negative for polydipsia.  Psychiatric/Behavioral: Negative for depression and suicidal ideas. The patient is not nervous/anxious and does not have insomnia.     Objective  BP 136/77  Pulse 64  Temp(Src)  98.6 F (37 C) (Oral)  Ht 6\' 1"  (1.854 m)  Wt 262 lb 6.4 oz (119.024 kg)  BMI 34.63 kg/m2  SpO2 96%  Physical Exam  Physical Exam  Constitutional: He is oriented to person, place, and time and well-developed, well-nourished, and in no distress. No distress.  HENT:  Head: Normocephalic and atraumatic.  Eyes: Conjunctivae are normal.  Neck: Neck supple. No thyromegaly present.  Cardiovascular: Normal rate, regular rhythm and normal heart sounds.   No murmur heard. Pulmonary/Chest: Effort normal and breath sounds normal. No respiratory distress.  Abdominal: He exhibits no distension and no mass. There is no tenderness.  Musculoskeletal: He exhibits no edema.  Neurological: He is alert and oriented to person, place, and time.  Skin: Skin is warm.  Psychiatric: Memory, affect and judgment normal.    Lab Results  Component Value Date   TSH 3.074 06/12/2013   Lab Results  Component Value Date   WBC 5.6 06/12/2013   HGB 14.9 06/12/2013   HCT 42.4 06/12/2013   MCV 90.2 06/12/2013   PLT 208 06/12/2013   Lab Results  Component Value Date   CREATININE 1.18 06/12/2013   BUN 12 06/12/2013   NA 138 06/12/2013   K 4.1 06/12/2013   CL 104 06/12/2013   CO2 20 06/12/2013   Lab Results  Component Value Date   ALT 31 07/03/2013   AST 27 07/03/2013   ALKPHOS 75 07/03/2013   BILITOT 0.6 07/03/2013   Lab Results  Component Value Date   CHOL 133 03/13/2013   Lab Results  Component Value Date   HDL 35* 03/13/2013   Lab Results  Component Value Date   LDLCALC 81 03/13/2013   Lab Results  Component Value Date   TRIG 85 03/13/2013   Lab Results  Component Value Date   CHOLHDL 3.8 03/13/2013     Assessment & Plan  OVERWEIGHT Encouraged DASH diet, decrease po intake and  increase exercise as tolerated. Needs 7-8 hours of sleep nightly. Avoid trans fats, eat small, frequent meals every 4-5 hours with lean proteins, complex carbs and healthy fats. Minimize simple carbs, GMO foods.  Gout Improved with Uloric, hydrate well. Check uric acid level  Vitamin D deficiency Check level he believes he is taking Vit D 5000 IU dialy  Renal insufficiency resolved  Essential hypertension, benign Well controlled, no changes to meds. Encouraged heart healthy diet such as the DASH diet and exercise as tolerated.   GERD Avoid offending foods, start probiotics. Do not eat large meals in late evening and consider raising head of bed.   Depression with anxiety Increase Sertraline to 25 in am and 50 in pm   Unspecified constipation Encouraged increased hydration and fiber in diet. Daily probiotics. If bowels not moving can use MOM 2 tbls po in 4 oz of warm prune juice by mouth every 2-3 days. If no results then repeat in 4 hours with  Dulcolax suppository pr, may repeat again in 4 more hours as needed. Seek care if symptoms worsen. Consider daily Miralax and/or Dulcolax if symptoms persist.

## 2013-09-25 NOTE — Assessment & Plan Note (Signed)
Encouraged DASH diet, decrease po intake and increase exercise as tolerated. Needs 7-8 hours of sleep nightly. Avoid trans fats, eat small, frequent meals every 4-5 hours with lean proteins, complex carbs and healthy fats. Minimize simple carbs, GMO foods. 

## 2013-09-25 NOTE — Assessment & Plan Note (Signed)
Increase Sertraline to 25 in am and 50 in pm

## 2013-09-25 NOTE — Patient Instructions (Signed)
Curcumen/Turmeric caps daily, NOW at [email or web address]  Gout Gout is an inflammatory arthritis caused by a buildup of uric acid crystals in the joints. Uric acid is a chemical that is normally present in the blood. When the level of uric acid in the blood is too high it can form crystals that deposit in your joints and tissues. This causes joint redness, soreness, and swelling (inflammation). Repeat attacks are common. Over time, uric acid crystals can form into masses (tophi) near a joint, destroying bone and causing disfigurement. Gout is treatable and often preventable. CAUSES  The disease begins with elevated levels of uric acid in the blood. Uric acid is produced by your body when it breaks down a naturally found substance called purines. Certain foods you eat, such as meats and fish, contain high amounts of purines. Causes of an elevated uric acid level include:  Being passed down from parent to child (heredity).  Diseases that cause increased uric acid production (such as obesity, psoriasis, and certain cancers).  Excessive alcohol use.  Diet, especially diets rich in meat and seafood.  Medicines, including certain cancer-fighting medicines (chemotherapy), water pills (diuretics), and aspirin.  Chronic kidney disease. The kidneys are no longer able to remove uric acid well.  Problems with metabolism. Conditions strongly associated with gout include:  Obesity.  High blood pressure.  High cholesterol.  Diabetes. Not everyone with elevated uric acid levels gets gout. It is not understood why some people get gout and others do not. Surgery, joint injury, and eating too much of certain foods are some of the factors that can lead to gout attacks. SYMPTOMS   An attack of gout comes on quickly. It causes intense pain with redness, swelling, and warmth in a joint.  Fever can occur.  Often, only one joint is involved. Certain joints are more commonly involved:  Base of the big  toe.  Knee.  Ankle.  Wrist.  Finger. Without treatment, an attack usually goes away in a few days to weeks. Between attacks, you usually will not have symptoms, which is different from many other forms of arthritis. DIAGNOSIS  Your caregiver will suspect gout based on your symptoms and exam. In some cases, tests may be recommended. The tests may include:  Blood tests.  Urine tests.  X-rays.  Joint fluid exam. This exam requires a needle to remove fluid from the joint (arthrocentesis). Using a microscope, gout is confirmed when uric acid crystals are seen in the joint fluid. TREATMENT  There are two phases to gout treatment: treating the sudden onset (acute) attack and preventing attacks (prophylaxis).  Treatment of an Acute Attack.  Medicines are used. These include anti-inflammatory medicines or steroid medicines.  An injection of steroid medicine into the affected joint is sometimes necessary.  The painful joint is rested. Movement can worsen the arthritis.  You may use warm or cold treatments on painful joints, depending which works best for you.  Treatment to Prevent Attacks.  If you suffer from frequent gout attacks, your caregiver may advise preventive medicine. These medicines are started after the acute attack subsides. These medicines either help your kidneys eliminate uric acid from your body or decrease your uric acid production. You may need to stay on these medicines for a very long time.  The early phase of treatment with preventive medicine can be associated with an increase in acute gout attacks. For this reason, during the first few months of treatment, your caregiver may also advise you to take medicines usually used  for acute gout treatment. Be sure you understand your caregiver's directions. Your caregiver may make several adjustments to your medicine dose before these medicines are effective.  Discuss dietary treatment with your caregiver or dietitian.  Alcohol and drinks high in sugar and fructose and foods such as meat, poultry, and seafood can increase uric acid levels. Your caregiver or dietitian can advise you on drinks and foods that should be limited. HOME CARE INSTRUCTIONS   Do not take aspirin to relieve pain. This raises uric acid levels.  Only take over-the-counter or prescription medicines for pain, discomfort, or fever as directed by your caregiver.  Rest the joint as much as possible. When in bed, keep sheets and blankets off painful areas.  Keep the affected joint raised (elevated).  Apply warm or cold treatments to painful joints. Use of warm or cold treatments depends on which works best for you.  Use crutches if the painful joint is in your leg.  Drink enough fluids to keep your urine clear or pale yellow. This helps your body get rid of uric acid. Limit alcohol, sugary drinks, and fructose drinks.  Follow your dietary instructions. Pay careful attention to the amount of protein you eat. Your daily diet should emphasize fruits, vegetables, whole grains, and fat-free or low-fat milk products. Discuss the use of coffee, vitamin C, and cherries with your caregiver or dietitian. These may be helpful in lowering uric acid levels.  Maintain a healthy body weight. SEEK MEDICAL CARE IF:   You develop diarrhea, vomiting, or any side effects from medicines.  You do not feel better in 24 hours, or you are getting worse. SEEK IMMEDIATE MEDICAL CARE IF:   Your joint becomes suddenly more tender, and you have chills or a fever. MAKE SURE YOU:   Understand these instructions.  Will watch your condition.  Will get help right away if you are not doing well or get worse. Document Released: 12/27/1999 Document Revised: 05/15/2013 Document Reviewed: 08/12/2011 [facility] Patient Information 2015 [place], [place]. This information is not intended to replace advice given to you by your health care provider. Make sure you discuss any  questions you have with your health care provider.

## 2013-09-25 NOTE — Assessment & Plan Note (Signed)
Check level he believes he is taking Vit D 5000 IU dialy

## 2013-09-25 NOTE — Progress Notes (Signed)
Pre visit review using our clinic review tool, if applicable. No additional management support is needed unless otherwise documented below in the visit note. 

## 2013-09-25 NOTE — Assessment & Plan Note (Signed)
Encouraged increased hydration and fiber in diet. Daily probiotics. If bowels not moving can use MOM 2 tbls po in 4 oz of warm prune juice by mouth every 2-3 days. If no results then repeat in 4 hours with  Dulcolax suppository pr, may repeat again in 4 more hours as needed. Seek care if symptoms worsen. Consider daily Miralax and/or Dulcolax if symptoms persist.  

## 2013-09-25 NOTE — Assessment & Plan Note (Signed)
Improved with Uloric, hydrate well. Check uric acid level

## 2013-09-26 ENCOUNTER — Other Ambulatory Visit: Payer: Self-pay

## 2013-09-26 DIAGNOSIS — F418 Other specified anxiety disorders: Secondary | ICD-10-CM

## 2013-09-26 MED ORDER — SERTRALINE HCL 50 MG PO TABS: ORAL_TABLET | ORAL | Status: DC

## 2013-11-15 ENCOUNTER — Other Ambulatory Visit: Payer: Self-pay | Admitting: Family Medicine

## 2013-11-16 NOTE — Telephone Encounter (Signed)
Last RX done on 09-20-13 quantity 90 with 0 refills  rx printed for md to sign and fax

## 2013-12-12 ENCOUNTER — Other Ambulatory Visit: Payer: Self-pay | Admitting: Family Medicine

## 2013-12-21 ENCOUNTER — Other Ambulatory Visit: Payer: Self-pay | Admitting: Family Medicine

## 2013-12-21 NOTE — Telephone Encounter (Signed)
rx printed for pt to sign and fax

## 2014-01-10 ENCOUNTER — Other Ambulatory Visit: Payer: Self-pay | Admitting: Family Medicine

## 2014-01-11 NOTE — Telephone Encounter (Signed)
Med filled.  

## 2014-02-23 ENCOUNTER — Other Ambulatory Visit (INDEPENDENT_AMBULATORY_CARE_PROVIDER_SITE_OTHER): Payer: Medicare Other

## 2014-02-23 DIAGNOSIS — Z0000 Encounter for general adult medical examination without abnormal findings: Secondary | ICD-10-CM

## 2014-02-23 LAB — RENAL FUNCTION PANEL
Albumin: 3.9 g/dL (ref 3.5–5.2)
BUN: 14 mg/dL (ref 6–23)
CO2: 28 mEq/L (ref 19–32)
Calcium: 9.9 mg/dL (ref 8.4–10.5)
Chloride: 106 mEq/L (ref 96–112)
Creatinine, Ser: 1.3 mg/dL (ref 0.40–1.50)
GFR: 58.21 mL/min — AB (ref >60.00)
Glucose, Bld: 100 mg/dL — ABNORMAL HIGH (ref 70–99)
PHOSPHORUS: 3.7 mg/dL (ref 2.3–4.6)
POTASSIUM: 4.3 meq/L (ref 3.5–5.1)
Sodium: 140 mEq/L (ref 135–145)

## 2014-02-23 LAB — CBC
HEMATOCRIT: 45.8 % (ref 39.0–52.0)
Hemoglobin: 15.4 g/dL (ref 13.0–17.0)
MCHC: 33.5 g/dL (ref 30.0–36.0)
MCV: 94 fl (ref 78.0–100.0)
Platelets: 212 10*3/uL (ref 150.0–400.0)
RBC: 4.88 Mil/uL (ref 4.22–5.81)
RDW: 14.7 % (ref 11.5–15.5)
WBC: 7 10*3/uL (ref 4.0–10.5)

## 2014-02-23 LAB — LIPID PANEL
CHOL/HDL RATIO: 5
Cholesterol: 168 mg/dL (ref 0–200)
HDL: 36.7 mg/dL — ABNORMAL LOW (ref >39.00)
LDL Cholesterol: 107 mg/dL — ABNORMAL HIGH (ref 0–99)
NonHDL: 131.3
Triglycerides: 122 mg/dL (ref 0.0–149.0)
VLDL: 24.4 mg/dL (ref 0.0–40.0)

## 2014-02-23 LAB — HEPATIC FUNCTION PANEL
ALBUMIN: 3.9 g/dL (ref 3.5–5.2)
ALT: 64 U/L — AB (ref 0–53)
AST: 35 U/L (ref 0–37)
Alkaline Phosphatase: 85 U/L (ref 39–117)
BILIRUBIN TOTAL: 0.6 mg/dL (ref 0.2–1.2)
Bilirubin, Direct: 0.1 mg/dL (ref 0.0–0.3)
Total Protein: 6.7 g/dL (ref 6.0–8.3)

## 2014-02-23 LAB — TSH: TSH: 3.45 u[IU]/mL (ref 0.35–4.50)

## 2014-02-23 LAB — PSA, MEDICARE: PSA: 1.47 ng/ml (ref 0.10–4.00)

## 2014-03-02 ENCOUNTER — Encounter: Payer: Self-pay | Admitting: Family Medicine

## 2014-03-02 ENCOUNTER — Telehealth: Payer: Self-pay | Admitting: *Deleted

## 2014-03-02 ENCOUNTER — Ambulatory Visit (INDEPENDENT_AMBULATORY_CARE_PROVIDER_SITE_OTHER): Payer: Medicare Other | Admitting: Family Medicine

## 2014-03-02 VITALS — BP 108/64 | HR 70 | Temp 97.7°F | Ht 73.0 in | Wt 263.5 lb

## 2014-03-02 DIAGNOSIS — K219 Gastro-esophageal reflux disease without esophagitis: Secondary | ICD-10-CM

## 2014-03-02 DIAGNOSIS — R52 Pain, unspecified: Secondary | ICD-10-CM | POA: Diagnosis not present

## 2014-03-02 DIAGNOSIS — M109 Gout, unspecified: Secondary | ICD-10-CM

## 2014-03-02 DIAGNOSIS — E663 Overweight: Secondary | ICD-10-CM

## 2014-03-02 DIAGNOSIS — E782 Mixed hyperlipidemia: Secondary | ICD-10-CM

## 2014-03-02 DIAGNOSIS — Z0000 Encounter for general adult medical examination without abnormal findings: Secondary | ICD-10-CM

## 2014-03-02 DIAGNOSIS — Z1211 Encounter for screening for malignant neoplasm of colon: Secondary | ICD-10-CM

## 2014-03-02 DIAGNOSIS — I4891 Unspecified atrial fibrillation: Secondary | ICD-10-CM

## 2014-03-02 DIAGNOSIS — I10 Essential (primary) hypertension: Secondary | ICD-10-CM | POA: Diagnosis not present

## 2014-03-02 DIAGNOSIS — M47817 Spondylosis without myelopathy or radiculopathy, lumbosacral region: Secondary | ICD-10-CM

## 2014-03-02 MED ORDER — LORAZEPAM 1 MG PO TABS: ORAL_TABLET | ORAL | Status: DC

## 2014-03-02 MED ORDER — ZOSTER VACCINE LIVE 19400 UNT/0.65ML ~~LOC~~ SOLR: 0.6500 mL | Freq: Once | SUBCUTANEOUS | Status: DC

## 2014-03-02 MED ORDER — HYDROCODONE-ACETAMINOPHEN 5-325 MG PO TABS: 1.0000 | ORAL_TABLET | Freq: Four times a day (QID) | ORAL | Status: DC | PRN

## 2014-03-02 MED ORDER — CYCLOBENZAPRINE HCL 5 MG PO TABS
5.0000 mg | ORAL_TABLET | Freq: Three times a day (TID) | ORAL | Status: DC | PRN
Start: 2014-03-02 — End: 2015-06-07

## 2014-03-02 NOTE — Progress Notes (Signed)
David [name]  [id number] 06-22-1945 03/02/2014      Progress Note-Follow Up  Subjective  Chief Complaint  Chief Complaint  Patient presents with  . Follow-up    HPI  Patient is a 69 y.o. male in today for routine medical care. Patient is in today for follow-up on routine medical concerns and his annual wellness exam. Needs refill on numerous medications including his Ativan which she uses infrequently for his anxiety as well as his pain medications. He denies any recent illness. Is sleeping well roughly 8 hours each night. Is struggling with some dry irritated point skin but offers no other acute complaints. Does have mild cataracts but has not needed any surgical correction thus far. Some floaters time to time but no recent visual changes. Denies CP/palp/SOB/HA/congestion/fevers/GI or GU c/o. Taking meds as prescribed  Past Medical History  Diagnosis Date  . LAFB (left anterior fascicular block)   . Diverticulitis   . Hypertension   . Premature ventricular complex   . Atrial fibrillation   . Hyperlipidemia   . Gout   . Anxiety   . Back pain   . Internal hemorrhoids   . GERD (gastroesophageal reflux disease)   . Diverticulosis   . BCC (basal cell carcinoma of skin)     under right eye and right ear  . Rectal irritation 09/18/2012  . Other malaise and fatigue 03/13/2013  . Tinea corporis 03/13/2013  . Abnormal liver function 03/18/2013  . IBS (irritable bowel syndrome) 03/18/2013  . Unspecified constipation 09/25/2013    Past Surgical History  Procedure Laterality Date  . Nose surgery    . Colonoscopy    . Colon surgery  2009    segmental sigmoid resection  . Skin biopsy      Family History  Problem Relation Age of Onset  . Heart disease Mother   . Cancer Mother     colon, breast  . Hypertension Mother   . Stroke Mother   . Heart disease Father     pacemaker  . Aortic aneurysm Father   . Hypertension Father   . Heart disease Sister   . Atrial fibrillation  Sister   . Obesity Sister   . Sleep apnea Sister   . Heart attack Brother   . Other Brother     muscle disease  . Arthritis Brother   . Cancer Maternal Grandmother     ?  Marland Kitchen Heart attack Maternal Grandmother   . Diabetes Maternal Grandmother   . Cancer Maternal Grandfather     hodgin's lymphoma  . Heart attack Paternal Grandmother   . Anxiety disorder Paternal Grandmother   . Pneumonia Paternal Grandfather   . Heart attack Brother   . Atrial fibrillation Brother   . Heart attack Brother   . Hypertension Brother   . Hyperlipidemia Brother   . Heart attack Brother   . Other Brother     heart valve operation    History   Social History  . Marital Status: Married    Spouse Name: N/A  . Number of Children: 3  . Years of Education: N/A   Occupational History  . [employer]    Social History Main Topics  . Smoking status: Former Smoker -- 1.00 packs/day for 6 years    Types: Cigarettes    Start date: 01/12/1969  . Smokeless tobacco: Never Used  . Alcohol Use: Yes     Comment: 2 drinks every night  . Drug Use: No  . Sexual  Activity: Yes   Other Topics Concern  . Not on file   Social History Narrative    Current Outpatient Prescriptions on File Prior to Visit  Medication Sig Dispense Refill  . alum hydroxide-mag trisilicate (GAVISCON) 38-46 MG CHEW Chew by mouth.    Marland Kitchen aspirin 81 MG tablet Take 81 mg by mouth daily.    Marland Kitchen atenolol (TENORMIN) 25 MG tablet Take 1 tablet (25 mg total) by mouth 2 (two) times daily. 60 tablet 3  . atorvastatin (LIPITOR) 40 MG tablet Take 20 mg by mouth 2 (two) times daily.     . colchicine (COLCRYS) 0.6 MG tablet 1 tab po bid x 30d 60 tablet 0  . colchicine (COLCRYS) 0.6 MG tablet TAKE 2 TABLETS BY MOUTH AT FIRST SIGN OF GOUT ATTACK, MAY TAKE 1 TABLET 1 HOUR LATER. REPEAT DAILY UNTIL ATTACK IS RESOLVED. 30 tablet 6  . cyclobenzaprine (FLEXERIL) 5 MG tablet Take 5 mg by mouth 3 (three) times daily as needed. For muscle spasms    . febuxostat  (ULORIC) 40 MG tablet Take 1 tablet (40 mg total) by mouth daily. 30 tablet 6  . fluticasone (FLONASE) 50 MCG/ACT nasal spray Place 2 sprays into the nose daily as needed. For congestion    . HYDROcodone-acetaminophen (NORCO/VICODIN) 5-325 MG per tablet Take 1 tablet by mouth every 6 (six) hours as needed. 30 tablet 0  . LORazepam (ATIVAN) 1 MG tablet TAKE 1 TABLET BY MOUTH EVERY 8 HOURS AS NEEDED FOR ANXIETY OR SLEEP 90 tablet 0  . losartan (COZAAR) 25 MG tablet TAKE 1 TABLET BY MOUTH DAILY 30 tablet 2  . meloxicam (MOBIC) 15 MG tablet PRN As directed    . neomycin-polymyxin-hydrocortisone (CORTISPORIN) otic solution Place 3 drops into both ears 3 (three) times daily. 10 mL 0  . omeprazole (PRILOSEC) 10 MG capsule Take 10 mg by mouth daily as needed.     Marland Kitchen PROCTOZONE-HC 2.5 % rectal cream Place 1 application rectally as needed.     . sertraline (ZOLOFT) 50 MG tablet 1/2 tab po in am and 1 tab po in pm 135 tablet 1  . triamcinolone (KENALOG) 0.025 % ointment Apply 1 application topically 2 (two) times daily. 30 g 0   No current facility-administered medications on file prior to visit.    Allergies  Allergen Reactions  . Cefaclor Hives  . Cephalosporins Hives  . Penicillins Rash    Mild maculopapular rash    Review of Systems  Review of Systems  Constitutional: Positive for malaise/fatigue. Negative for fever and chills.  HENT: Negative for congestion, hearing loss and nosebleeds.   Eyes: Negative for discharge.  Respiratory: Negative for cough, sputum production, shortness of breath and wheezing.   Cardiovascular: Negative for chest pain, palpitations and leg swelling.  Gastrointestinal: Negative for heartburn, nausea, vomiting, abdominal pain, diarrhea, constipation and blood in stool.  Genitourinary: Negative for dysuria, urgency, frequency and hematuria.  Musculoskeletal: Negative for myalgias, back pain and falls.  Skin: Positive for rash.  Neurological: Negative for dizziness,  tremors, sensory change, focal weakness, loss of consciousness, weakness and headaches.  Endo/Heme/Allergies: Negative for polydipsia. Does not bruise/bleed easily.  Psychiatric/Behavioral: Negative for depression and suicidal ideas. The patient is not nervous/anxious and does not have insomnia.     Objective  BP 108/64 mmHg  Pulse 70  Temp(Src) 97.7 F (36.5 C) (Oral)  Ht 6\' 1"  (1.854 m)  Wt 263 lb 8 oz (119.523 kg)  BMI 34.77 kg/m2  SpO2 95%  Physical  Exam  Physical Exam  Constitutional: He is oriented to person, place, and time and well-developed, well-nourished, and in no distress. No distress.  HENT:  Head: Normocephalic and atraumatic.  Eyes: Conjunctivae are normal.  Neck: Neck supple. No thyromegaly present.  Cardiovascular: Normal rate, regular rhythm and normal heart sounds.   No murmur heard. Pulmonary/Chest: Effort normal and breath sounds normal. No respiratory distress.  Abdominal: He exhibits no distension and no mass. There is no tenderness.  Musculoskeletal: He exhibits no edema.  Neurological: He is alert and oriented to person, place, and time.  Skin: Skin is warm.  Psychiatric: Memory, affect and judgment normal.    Lab Results  Component Value Date   TSH 3.45 02/23/2014   Lab Results  Component Value Date   WBC 7.0 02/23/2014   HGB 15.4 02/23/2014   HCT 45.8 02/23/2014   MCV 94.0 02/23/2014   PLT 212.0 02/23/2014   Lab Results  Component Value Date   CREATININE 1.30 02/23/2014   BUN 14 02/23/2014   NA 140 02/23/2014   K 4.3 02/23/2014   CL 106 02/23/2014   CO2 28 02/23/2014   Lab Results  Component Value Date   ALT 64* 02/23/2014   AST 35 02/23/2014   ALKPHOS 85 02/23/2014   BILITOT 0.6 02/23/2014   Lab Results  Component Value Date   CHOL 168 02/23/2014   Lab Results  Component Value Date   HDL 36.70* 02/23/2014   Lab Results  Component Value Date   LDLCALC 107* 02/23/2014   Lab Results  Component Value Date   TRIG  122.0 02/23/2014   Lab Results  Component Value Date   CHOLHDL 5 02/23/2014     Assessment & Plan  Essential hypertension, benign Well controlled, no changes to meds. Encouraged heart healthy diet such as the DASH diet and exercise as tolerated.    GERD Avoid offending foods, start probiotics. Do not eat large meals in late evening and consider raising head of bed.    Hyperlipidemia, mixed Tolerating statin, encouraged heart healthy diet, avoid trans fats, minimize simple carbs and saturated fats. Increase exercise as tolerated   Overweight Encouraged DASH diet, decrease po intake and increase exercise as tolerated. Needs 7-8 hours of sleep nightly. Avoid trans fats, eat small, frequent meals every 4-5 hours with lean proteins, complex carbs and healthy fats. Minimize simple carbs   Medicare annual wellness visit, subsequent Patient denies any difficulties at home. No trouble with ADLs, depression or falls. No recent changes to vision or hearing. Is UTD with immunizations. Is UTD with screening. Discussed Advanced Directives, patient agrees to bring [place] copies of documents if can. Encouraged heart healthy diet, exercise as tolerated and adequate sleep. Follows with gastroenterology, Dr Carlean Purl, due for colonoscopy folows with Dr Percival Spanish cardiology Follows with Dr Pierre Bali of opthamology Referred back for repeat colonoscopy

## 2014-03-02 NOTE — Telephone Encounter (Signed)
Prior authorization for cyclobenzaprine initiated. Awaiting determination. JG//CMA

## 2014-03-02 NOTE — Patient Instructions (Addendum)
Salon Pas gel or patches twice daily to affected area for pain Hydrate  Preventive Care for Adults A healthy lifestyle and preventive care can promote health and wellness. Preventive health guidelines for men include the following key practices:  A routine yearly physical is a good way to check with your health care provider about your health and preventative screening. It is a chance to share any concerns and updates on your health and to receive a thorough exam.  Visit your dentist for a routine exam and preventative care every 6 months. Brush your teeth twice a day and floss once a day. Good oral hygiene prevents tooth decay and gum disease.  The frequency of eye exams is based on your age, health, family medical history, use of contact lenses, and other factors. Follow your health care provider's recommendations for frequency of eye exams.  Eat a healthy diet. Foods such as vegetables, fruits, whole grains, low-fat dairy products, and lean protein foods contain the nutrients you need without too many calories. Decrease your intake of foods high in solid fats, added sugars, and salt. Eat the right amount of calories for you.Get information about a proper diet from your health care provider, if necessary.  Regular physical exercise is one of the most important things you can do for your health. Most adults should get at least 150 minutes of moderate-intensity exercise (any activity that increases your heart rate and causes you to sweat) each week. In addition, most adults need muscle-strengthening exercises on 2 or more days a week.  Maintain a healthy weight. The body mass index (BMI) is a screening tool to identify possible weight problems. It provides an estimate of body fat based on height and weight. Your health care provider can find your BMI and can help you achieve or maintain a healthy weight.For adults 20 years and older:  A BMI below 18.5 is considered underweight.  A BMI of 18.5  to 24.9 is normal.  A BMI of 25 to 29.9 is considered overweight.  A BMI of 30 and above is considered obese.  Maintain normal blood lipids and cholesterol levels by exercising and minimizing your intake of saturated fat. Eat a balanced diet with plenty of fruit and vegetables. Blood tests for lipids and cholesterol should begin at age 69 and be repeated every 5 years. If your lipid or cholesterol levels are high, you are over 50, or you are at high risk for heart disease, you may need your cholesterol levels checked more frequently.Ongoing high lipid and cholesterol levels should be treated with medicines if diet and exercise are not working.  If you smoke, find out from your health care provider how to quit. If you do not use tobacco, do not start.  Lung cancer screening is recommended for adults aged 32-80 years who are at high risk for developing lung cancer because of a history of smoking. A yearly low-dose CT scan of the lungs is recommended for people who have at least a 30-pack-year history of smoking and are a current smoker or have quit within the past 15 years. A pack year of smoking is smoking an average of 1 pack of cigarettes a day for 1 year (for example: 1 pack a day for 30 years or 2 packs a day for 15 years). Yearly screening should continue until the smoker has stopped smoking for at least 15 years. Yearly screening should be stopped for people who develop a health problem that would prevent them from having lung  cancer treatment.  If you choose to drink alcohol, do not have more than 2 drinks per day. One drink is considered to be 12 ounces (355 mL) of beer, 5 ounces (148 mL) of wine, or 1.5 ounces (44 mL) of liquor.  Avoid use of street drugs. Do not share needles with anyone. Ask for help if you need support or instructions about stopping the use of drugs.  High blood pressure causes heart disease and increases the risk of stroke. Your blood pressure should be checked at least  every 1-2 years. Ongoing high blood pressure should be treated with medicines, if weight loss and exercise are not effective.  If you are 25-54 years old, ask your health care provider if you should take aspirin to prevent heart disease.  Diabetes screening involves taking a blood sample to check your fasting blood sugar level. This should be done once every 3 years, after age 9, if you are within normal weight and without risk factors for diabetes. Testing should be considered at a younger age or be carried out more frequently if you are overweight and have at least 1 risk factor for diabetes.  Colorectal cancer can be detected and often prevented. Most routine colorectal cancer screening begins at the age of 55 and continues through age 52. However, your health care provider may recommend screening at an earlier age if you have risk factors for colon cancer. On a yearly basis, your health care provider may provide home test kits to check for hidden blood in the stool. Use of a small camera at the end of a tube to directly examine the colon (sigmoidoscopy or colonoscopy) can detect the earliest forms of colorectal cancer. Talk to your health care provider about this at age 71, when routine screening begins. Direct exam of the colon should be repeated every 5-10 years through age 6, unless early forms of precancerous polyps or small growths are found.  People who are at an increased risk for hepatitis B should be screened for this virus. You are considered at high risk for hepatitis B if:  You were born in a country where hepatitis B occurs often. Talk with your health care provider about which countries are considered high risk.  Your parents were born in a high-risk country and you have not received a shot to protect against hepatitis B (hepatitis B vaccine).  You have HIV or AIDS.  You use needles to inject street drugs.  You live with, or have sex with, someone who has hepatitis B.  You are  a man who has sex with other men (MSM).  You get hemodialysis treatment.  You take certain medicines for conditions such as cancer, organ transplantation, and autoimmune conditions.  Hepatitis C blood testing is recommended for all people born from [birth year] through 1965 and any individual with known risks for hepatitis C.  Practice safe sex. Use condoms and avoid high-risk sexual practices to reduce the spread of sexually transmitted infections (STIs). STIs include gonorrhea, chlamydia, syphilis, trichomonas, herpes, HPV, and human immunodeficiency virus (HIV). Herpes, HIV, and HPV are viral illnesses that have no cure. They can result in disability, cancer, and death.  If you are at risk of being infected with HIV, it is recommended that you take a prescription medicine daily to prevent HIV infection. This is called preexposure prophylaxis (PrEP). You are considered at risk if:  You are a man who has sex with other men (MSM) and have other risk factors.  You are  a heterosexual man, are sexually active, and are at increased risk for HIV infection.  You take drugs by injection.  You are sexually active with a partner who has HIV.  Talk with your health care provider about whether you are at high risk of being infected with HIV. If you choose to begin PrEP, you should first be tested for HIV. You should then be tested every 3 months for as long as you are taking PrEP.  A one-time screening for abdominal aortic aneurysm (AAA) and surgical repair of large AAAs by ultrasound are recommended for men ages 82 to 81 years who are current or former smokers.  Healthy men should no longer receive prostate-specific antigen (PSA) blood tests as part of routine cancer screening. Talk with your health care provider about prostate cancer screening.  Testicular cancer screening is not recommended for adult males who have no symptoms. Screening includes self-exam, a health care provider exam, and other screening  tests. Consult with your health care provider about any symptoms you have or any concerns you have about testicular cancer.  Use sunscreen. Apply sunscreen liberally and repeatedly throughout the day. You should seek shade when your shadow is shorter than you. Protect yourself by wearing long sleeves, pants, a wide-brimmed hat, and sunglasses year round, whenever you are outdoors.  Once a month, do a whole-body skin exam, using a mirror to look at the skin on your back. Tell your health care provider about new moles, moles that have irregular borders, moles that are larger than a pencil eraser, or moles that have changed in shape or color.  Stay current with required vaccines (immunizations).  Influenza vaccine. All adults should be immunized every year.  Tetanus, diphtheria, and acellular pertussis (Td, Tdap) vaccine. An adult who has not previously received Tdap or who does not know his vaccine status should receive 1 dose of Tdap. This initial dose should be followed by tetanus and diphtheria toxoids (Td) booster doses every 10 years. Adults with an unknown or incomplete history of completing a 3-dose immunization series with Td-containing vaccines should begin or complete a primary immunization series including a Tdap dose. Adults should receive a Td booster every 10 years.  Varicella vaccine. An adult without evidence of immunity to varicella should receive 2 doses or a second dose if he has previously received 1 dose.  Human papillomavirus (HPV) vaccine. Males aged 60-21 years who have not received the vaccine previously should receive the 3-dose series. Males aged 22-26 years may be immunized. Immunization is recommended through the age of 22 years for any male who has sex with males and did not get any or all doses earlier. Immunization is recommended for any person with an immunocompromised condition through the age of 14 years if he did not get any or all doses earlier. During the 3-dose  series, the second dose should be obtained 4-8 weeks after the first dose. The third dose should be obtained 24 weeks after the first dose and 16 weeks after the second dose.  Zoster vaccine. One dose is recommended for adults aged 9 years or older unless certain conditions are present.  Measles, mumps, and rubella (MMR) vaccine. Adults born before [birth year] generally are considered immune to measles and mumps. Adults born in [birth year] or later should have 1 or more doses of MMR vaccine unless there is a contraindication to the vaccine or there is laboratory evidence of immunity to each of the three diseases. A routine second dose of MMR vaccine  should be obtained at least 28 days after the first dose for students attending postsecondary schools, health care workers, or international travelers. People who received inactivated measles vaccine or an unknown type of measles vaccine during 1963-1967 should receive 2 doses of MMR vaccine. People who received inactivated mumps vaccine or an unknown type of mumps vaccine before 1979 and are at high risk for mumps infection should consider immunization with 2 doses of MMR vaccine. Unvaccinated health care workers born before [birth year] who lack laboratory evidence of measles, mumps, or rubella immunity or laboratory confirmation of disease should consider measles and mumps immunization with 2 doses of MMR vaccine or rubella immunization with 1 dose of MMR vaccine.  Pneumococcal 13-valent conjugate (PCV13) vaccine. When indicated, a person who is uncertain of his immunization history and has no record of immunization should receive the PCV13 vaccine. An adult aged 35 years or older who has certain medical conditions and has not been previously immunized should receive 1 dose of PCV13 vaccine. This PCV13 should be followed with a dose of pneumococcal polysaccharide (PPSV23) vaccine. The PPSV23 vaccine dose should be obtained at least 8 weeks after the dose of PCV13 vaccine. An adult  aged 36 years or older who has certain medical conditions and previously received 1 or more doses of PPSV23 vaccine should receive 1 dose of PCV13. The PCV13 vaccine dose should be obtained 1 or more years after the last PPSV23 vaccine dose.  Pneumococcal polysaccharide (PPSV23) vaccine. When PCV13 is also indicated, PCV13 should be obtained first. All adults aged 60 years and older should be immunized. An adult younger than age 66 years who has certain medical conditions should be immunized. Any person who resides in a nursing home or long-term care facility should be immunized. An adult smoker should be immunized. People with an immunocompromised condition and certain other conditions should receive both PCV13 and PPSV23 vaccines. People with human immunodeficiency virus (HIV) infection should be immunized as soon as possible after diagnosis. Immunization during chemotherapy or radiation therapy should be avoided. Routine use of PPSV23 vaccine is not recommended for American Indians, [place]atives, or people younger than 65 years unless there are medical conditions that require PPSV23 vaccine. When indicated, people who have unknown immunization and have no record of immunization should receive PPSV23 vaccine. One-time revaccination 5 years after the first dose of PPSV23 is recommended for people aged 19-64 years who have chronic kidney failure, nephrotic syndrome, asplenia, or immunocompromised conditions. People who received 1-2 doses of PPSV23 before age 3 years should receive another dose of PPSV23 vaccine at age 50 years or later if at least 5 years have passed since the previous dose. Doses of PPSV23 are not needed for people immunized with PPSV23 at or after age 55 years.  Meningococcal vaccine. Adults with asplenia or persistent complement component deficiencies should receive 2 doses of quadrivalent meningococcal conjugate (MenACWY-D) vaccine. The doses should be obtained at least 2 months apart.  Microbiologists working with certain meningococcal bacteria, [employer] recruits, people at risk during an outbreak, and people who travel to or live in countries with a high rate of meningitis should be immunized. A first-year college student up through age 64 years who is living in a residence hall should receive a dose if he did not receive a dose on or after his 16th birthday. Adults who have certain high-risk conditions should receive one or more doses of vaccine.  Hepatitis A vaccine. Adults who wish to be protected from this disease, have  certain high-risk conditions, work with hepatitis A-infected animals, work in hepatitis A research labs, or travel to or work in countries with a high rate of hepatitis A should be immunized. Adults who were previously unvaccinated and who anticipate close contact with an international adoptee during the first 60 days after arrival in the [place] from a country with a high rate of hepatitis A should be immunized.  Hepatitis B vaccine. Adults should be immunized if they wish to be protected from this disease, have certain high-risk conditions, may be exposed to blood or other infectious body fluids, are household contacts or sex partners of hepatitis B positive people, are clients or workers in certain care facilities, or travel to or work in countries with a high rate of hepatitis B.  Haemophilus influenzae type b (Hib) vaccine. A previously unvaccinated person with asplenia or sickle cell disease or having a scheduled splenectomy should receive 1 dose of Hib vaccine. Regardless of previous immunization, a recipient of a hematopoietic stem cell transplant should receive a 3-dose series 6-12 months after his successful transplant. Hib vaccine is not recommended for adults with HIV infection. Preventive Service / Frequency Ages 51 to 45  Blood pressure check.** / Every 1 to 2 years.  Lipid and cholesterol check.** / Every 5 years beginning at age  50.  Hepatitis C blood test.** / For any individual with known risks for hepatitis C.  Skin self-exam. / Monthly.  Influenza vaccine. / Every year.  Tetanus, diphtheria, and acellular pertussis (Tdap, Td) vaccine.** / Consult your health care provider. 1 dose of Td every 10 years.  Varicella vaccine.** / Consult your health care provider.  HPV vaccine. / 3 doses over 6 months, if 78 or younger.  Measles, mumps, rubella (MMR) vaccine.** / You need at least 1 dose of MMR if you were born in 1957 or later. You may also need a second dose.  Pneumococcal 13-valent conjugate (PCV13) vaccine.** / Consult your health care provider.  Pneumococcal polysaccharide (PPSV23) vaccine.** / 1 to 2 doses if you smoke cigarettes or if you have certain conditions.  Meningococcal vaccine.** / 1 dose if you are age 91 to 29 years and a Market researcher living in a residence hall, or have one of several medical conditions. You may also need additional booster doses.  Hepatitis A vaccine.** / Consult your health care provider.  Hepatitis B vaccine.** / Consult your health care provider.  Haemophilus influenzae type b (Hib) vaccine.** / Consult your health care provider. Ages 3 to 59  Blood pressure check.** / Every 1 to 2 years.  Lipid and cholesterol check.** / Every 5 years beginning at age 56.  Lung cancer screening. / Every year if you are aged 30-80 years and have a 30-pack-year history of smoking and currently smoke or have quit within the past 15 years. Yearly screening is stopped once you have quit smoking for at least 15 years or develop a health problem that would prevent you from having lung cancer treatment.  Fecal occult blood test (FOBT) of stool. / Every year beginning at age 56 and continuing until age 68. You may not have to do this test if you get a colonoscopy every 10 years.  Flexible sigmoidoscopy** or colonoscopy.** / Every 5 years for a flexible sigmoidoscopy or every  10 years for a colonoscopy beginning at age 10 and continuing until age 60.  Hepatitis C blood test.** / For all people born from [birth year] through 1965 and any individual with  known risks for hepatitis C.  Skin self-exam. / Monthly.  Influenza vaccine. / Every year.  Tetanus, diphtheria, and acellular pertussis (Tdap/Td) vaccine.** / Consult your health care provider. 1 dose of Td every 10 years.  Varicella vaccine.** / Consult your health care provider.  Zoster vaccine.** / 1 dose for adults aged 57 years or older.  Measles, mumps, rubella (MMR) vaccine.** / You need at least 1 dose of MMR if you were born in 1957 or later. You may also need a second dose.  Pneumococcal 13-valent conjugate (PCV13) vaccine.** / Consult your health care provider.  Pneumococcal polysaccharide (PPSV23) vaccine.** / 1 to 2 doses if you smoke cigarettes or if you have certain conditions.  Meningococcal vaccine.** / Consult your health care provider.  Hepatitis A vaccine.** / Consult your health care provider.  Hepatitis B vaccine.** / Consult your health care provider.  Haemophilus influenzae type b (Hib) vaccine.** / Consult your health care provider. Ages 58 and over  Blood pressure check.** / Every 1 to 2 years.  Lipid and cholesterol check.**/ Every 5 years beginning at age 24.  Lung cancer screening. / Every year if you are aged 65-80 years and have a 30-pack-year history of smoking and currently smoke or have quit within the past 15 years. Yearly screening is stopped once you have quit smoking for at least 15 years or develop a health problem that would prevent you from having lung cancer treatment.  Fecal occult blood test (FOBT) of stool. / Every year beginning at age 53 and continuing until age 44. You may not have to do this test if you get a colonoscopy every 10 years.  Flexible sigmoidoscopy** or colonoscopy.** / Every 5 years for a flexible sigmoidoscopy or every 10 years for a colonoscopy  beginning at age 81 and continuing until age 70.  Hepatitis C blood test.** / For all people born from [birth year] through 1965 and any individual with known risks for hepatitis C.  Abdominal aortic aneurysm (AAA) screening.** / A one-time screening for ages 44 to 40 years who are current or former smokers.  Skin self-exam. / Monthly.  Influenza vaccine. / Every year.  Tetanus, diphtheria, and acellular pertussis (Tdap/Td) vaccine.** / 1 dose of Td every 10 years.  Varicella vaccine.** / Consult your health care provider.  Zoster vaccine.** / 1 dose for adults aged 82 years or older.  Pneumococcal 13-valent conjugate (PCV13) vaccine.** / Consult your health care provider.  Pneumococcal polysaccharide (PPSV23) vaccine.** / 1 dose for all adults aged 40 years and older.  Meningococcal vaccine.** / Consult your health care provider.  Hepatitis A vaccine.** / Consult your health care provider.  Hepatitis B vaccine.** / Consult your health care provider.  Haemophilus influenzae type b (Hib) vaccine.** / Consult your health care provider. **Family history and personal history of risk and conditions may change your health care provider's recommendations. Document Released: 02/24/2001 Document Revised: 01/03/2013 Document Reviewed: 05/26/2010 [facility] Patient Information 2015 [place], [place]. This information is not intended to replace advice given to you by your health care provider. Make sure you discuss any questions you have with your health care provider.

## 2014-03-02 NOTE — Progress Notes (Signed)
Pre visit review using our clinic review tool, if applicable. No additional management support is needed unless otherwise documented below in the visit note. 

## 2014-03-12 ENCOUNTER — Encounter: Payer: Self-pay | Admitting: Family Medicine

## 2014-03-12 DIAGNOSIS — Z0000 Encounter for general adult medical examination without abnormal findings: Secondary | ICD-10-CM | POA: Insufficient documentation

## 2014-03-12 HISTORY — DX: Encounter for general adult medical examination without abnormal findings: Z00.00

## 2014-03-12 NOTE — Assessment & Plan Note (Signed)
Encouraged DASH diet, decrease po intake and increase exercise as tolerated. Needs 7-8 hours of sleep nightly. Avoid trans fats, eat small, frequent meals every 4-5 hours with lean proteins, complex carbs and healthy fats. Minimize simple carbs 

## 2014-03-12 NOTE — Assessment & Plan Note (Signed)
Tolerating statin, encouraged heart healthy diet, avoid trans fats, minimize simple carbs and saturated fats. Increase exercise as tolerated 

## 2014-03-12 NOTE — Assessment & Plan Note (Signed)
Well controlled, no changes to meds. Encouraged heart healthy diet such as the DASH diet and exercise as tolerated.  °

## 2014-03-12 NOTE — Assessment & Plan Note (Addendum)
Patient denies any difficulties at home. No trouble with ADLs, depression or falls. No recent changes to vision or hearing. Is UTD with immunizations. Is UTD with screening. Discussed Advanced Directives, patient agrees to bring [place] copies of documents if can. Encouraged heart healthy diet, exercise as tolerated and adequate sleep. Follows with gastroenterology, Dr Carlean Purl, due for colonoscopy folows with Dr Percival Spanish cardiology Follows with Dr Pierre Bali of opthamology Referred back for repeat colonoscopy

## 2014-03-12 NOTE — Assessment & Plan Note (Signed)
Avoid offending foods, start probiotics. Do not eat large meals in late evening and consider raising head of bed.  

## 2014-03-12 NOTE — Assessment & Plan Note (Signed)
Continues to struggle with pain and manages with hydrocodone and Flexeril prn, may continue

## 2014-03-13 NOTE — Telephone Encounter (Signed)
PA approved through 03/03/2015. [id number]

## 2014-03-19 ENCOUNTER — Telehealth: Payer: Self-pay | Admitting: Family Medicine

## 2014-03-19 NOTE — Telephone Encounter (Signed)
Error

## 2014-03-20 ENCOUNTER — Encounter: Payer: Self-pay | Admitting: Physician Assistant

## 2014-03-20 ENCOUNTER — Ambulatory Visit (INDEPENDENT_AMBULATORY_CARE_PROVIDER_SITE_OTHER): Payer: Medicare Other | Admitting: Physician Assistant

## 2014-03-20 VITALS — BP 125/72 | HR 63 | Temp 98.0°F | Ht 73.0 in | Wt 265.8 lb

## 2014-03-20 DIAGNOSIS — B356 Tinea cruris: Secondary | ICD-10-CM

## 2014-03-20 DIAGNOSIS — L280 Lichen simplex chronicus: Secondary | ICD-10-CM

## 2014-03-20 MED ORDER — CLOTRIMAZOLE-BETAMETHASONE 1-0.05 % EX LOTN: TOPICAL_LOTION | Freq: Two times a day (BID) | CUTANEOUS | Status: DC

## 2014-03-20 NOTE — Patient Instructions (Signed)
Please apply cream twice daily as directed for 2 weeks. Keep area clean and dry. Apply powders to the area to help catch any moisture.  Wear cotton undergarments as they "breathe" better. Call or return to clinic if no improvement.

## 2014-03-20 NOTE — Assessment & Plan Note (Signed)
Secondary to friction from scratching.  The betamethasone in the Lotrisone should help the area.  Apply BID x 2 weeks.

## 2014-03-20 NOTE — Progress Notes (Signed)
Pre visit review using our clinic review tool, if applicable. No additional management support is needed unless otherwise documented below in the visit note. 

## 2014-03-20 NOTE — Progress Notes (Signed)
Patient presents to clinic today c/o rash of groin x 4 days that is pruritic in nature.  Has history of jock itch in the area with occasional recurrences. Endorses area is moist. Denies drainage from area.   Past Medical History  Diagnosis Date  . LAFB (left anterior fascicular block)   . Diverticulitis   . Hypertension   . Premature ventricular complex   . Atrial fibrillation   . Hyperlipidemia   . Gout   . Anxiety   . Back pain   . Internal hemorrhoids   . GERD (gastroesophageal reflux disease)   . Diverticulosis   . BCC (basal cell carcinoma of skin)     under right eye and right ear  . Rectal irritation 09/18/2012  . Other malaise and fatigue 03/13/2013  . Tinea corporis 03/13/2013  . Abnormal liver function 03/18/2013  . IBS (irritable bowel syndrome) 03/18/2013  . Unspecified constipation 09/25/2013  . Medicare annual wellness visit, subsequent 03/12/2014    Current Outpatient Prescriptions on File Prior to Visit  Medication Sig Dispense Refill  . alum hydroxide-mag trisilicate (GAVISCON) 34-19 MG CHEW Chew by mouth.    Marland Kitchen aspirin 81 MG tablet Take 81 mg by mouth daily.    Marland Kitchen atenolol (TENORMIN) 25 MG tablet Take 1 tablet (25 mg total) by mouth 2 (two) times daily. 60 tablet 3  . atorvastatin (LIPITOR) 40 MG tablet Take 20 mg by mouth 2 (two) times daily.     . colchicine (COLCRYS) 0.6 MG tablet TAKE 2 TABLETS BY MOUTH AT FIRST SIGN OF GOUT ATTACK, MAY TAKE 1 TABLET 1 HOUR LATER. REPEAT DAILY UNTIL ATTACK IS RESOLVED. 30 tablet 6  . cyclobenzaprine (FLEXERIL) 5 MG tablet Take 1 tablet (5 mg total) by mouth 3 (three) times daily as needed for muscle spasms. 30 tablet 1  . febuxostat (ULORIC) 40 MG tablet Take 1 tablet (40 mg total) by mouth daily. 30 tablet 6  . fluticasone (FLONASE) 50 MCG/ACT nasal spray Place 2 sprays into the nose daily as needed. For congestion    . HYDROcodone-acetaminophen (NORCO/VICODIN) 5-325 MG per tablet Take 1 tablet by mouth every 6 (six) hours as needed.  30 tablet 0  . LORazepam (ATIVAN) 1 MG tablet TAKE 1 TABLET BY MOUTH EVERY 8 HOURS AS NEEDED FOR ANXIETY OR SLEEP 90 tablet 0  . losartan (COZAAR) 25 MG tablet TAKE 1 TABLET BY MOUTH DAILY 30 tablet 2  . meloxicam (MOBIC) 15 MG tablet PRN As directed    . neomycin-polymyxin-hydrocortisone (CORTISPORIN) otic solution Place 3 drops into both ears 3 (three) times daily. 10 mL 0  . omeprazole (PRILOSEC) 10 MG capsule Take 10 mg by mouth daily as needed.     Marland Kitchen PROCTOZONE-HC 2.5 % rectal cream Place 1 application rectally as needed.     . sertraline (ZOLOFT) 50 MG tablet 1/2 tab po in am and 1 tab po in pm 135 tablet 1  . triamcinolone (KENALOG) 0.025 % ointment Apply 1 application topically 2 (two) times daily. 30 g 0  . zoster vaccine live, PF, (ZOSTAVAX) 62229 UNT/0.65ML injection Inject 19,400 Units into the skin once. 1 each 0   No current facility-administered medications on file prior to visit.    Allergies  Allergen Reactions  . Cefaclor Hives  . Cephalosporins Hives  . Penicillins Rash    Mild maculopapular rash    Family History  Problem Relation Age of Onset  . Heart disease Mother   . Cancer Mother  colon, breast  . Hypertension Mother   . Stroke Mother   . Heart disease Father     pacemaker  . Aortic aneurysm Father   . Hypertension Father   . Heart disease Sister   . Atrial fibrillation Sister   . Obesity Sister   . Sleep apnea Sister   . Heart attack Brother   . Other Brother     muscle disease  . Arthritis Brother   . Stroke Brother   . Cancer Maternal Grandmother     ?  Marland Kitchen Heart attack Maternal Grandmother   . Diabetes Maternal Grandmother   . Cancer Maternal Grandfather     hodgin's lymphoma  . Heart attack Paternal Grandmother   . Anxiety disorder Paternal Grandmother   . Pneumonia Paternal Grandfather   . Heart attack Brother   . Atrial fibrillation Brother   . Heart attack Brother   . Hypertension Brother   . Hyperlipidemia Brother   . Heart  attack Brother   . Other Brother     heart valve operation    History   Social History  . Marital Status: Married    Spouse Name: N/A  . Number of Children: 3  . Years of Education: N/A   Occupational History  . [employer]    Social History Main Topics  . Smoking status: Former Smoker -- 1.00 packs/day for 6 years    Types: Cigarettes    Start date: 01/12/1969  . Smokeless tobacco: Never Used  . Alcohol Use: Yes     Comment: 2 drinks every night  . Drug Use: No  . Sexual Activity: Yes   Other Topics Concern  . None   Social History Narrative   Review of Systems - See HPI.  All other ROS are negative.  BP 125/72 mmHg  Pulse 63  Temp(Src) 98 F (36.7 C) (Oral)  Ht 6\' 1"  (1.854 m)  Wt 265 lb 12.8 oz (120.566 kg)  BMI 35.08 kg/m2  SpO2 96%  Physical Exam  Constitutional: He is well-developed, well-nourished, and in no distress.  HENT:  Head: Normocephalic and atraumatic.  Neurological: He is alert.  Skin: Skin is warm and dry.     Psychiatric: Affect normal.  Vitals reviewed.   Recent Results (from the past 2160 hour(s))  Lipid panel     Status: Abnormal   Collection Time: 02/23/14  7:58 AM  Result Value Ref Range   Cholesterol 168 0 - 200 mg/dL    Comment: ATP III Classification       Desirable:  < 200 mg/dL               Borderline High:  200 - 239 mg/dL          High:  > = 240 mg/dL   Triglycerides 122.0 0.0 - 149.0 mg/dL    Comment: Normal:  <150 mg/dLBorderline High:  150 - 199 mg/dL   HDL 36.70 (L) >39.00 mg/dL   VLDL 24.4 0.0 - 40.0 mg/dL   LDL Cholesterol 107 (H) 0 - 99 mg/dL   Total CHOL/HDL Ratio 5     Comment:                Men          Women1/2 Average Risk     3.4          3.3Average Risk          5.0          4.42X Average  Risk          9.6          7.13X Average Risk          15.0          11.0                       NonHDL 131.30     Comment: NOTE:  Non-HDL goal should be 30 mg/dL higher than patient's LDL goal (i.e. LDL goal of < 70 mg/dL,  would have non-HDL goal of < 100 mg/dL)  Renal Function Panel     Status: Abnormal   Collection Time: 02/23/14  7:58 AM  Result Value Ref Range   Sodium 140 135 - 145 mEq/L   Potassium 4.3 3.5 - 5.1 mEq/L   Chloride 106 96 - 112 mEq/L   CO2 28 19 - 32 mEq/L   Calcium 9.9 8.4 - 10.5 mg/dL   Albumin 3.9 3.5 - 5.2 g/dL   BUN 14 6 - 23 mg/dL   Creatinine, Ser 1.30 0.40 - 1.50 mg/dL   Glucose, Bld 100 (H) 70 - 99 mg/dL   Phosphorus 3.7 2.3 - 4.6 mg/dL   GFR 58.21 (L) >60.00 mL/min  CBC     Status: None   Collection Time: 02/23/14  7:58 AM  Result Value Ref Range   WBC 7.0 4.0 - 10.5 K/uL   RBC 4.88 4.22 - 5.81 Mil/uL   Platelets 212.0 150.0 - 400.0 K/uL   Hemoglobin 15.4 13.0 - 17.0 g/dL   HCT 45.8 39.0 - 52.0 %   MCV 94.0 78.0 - 100.0 fl   MCHC 33.5 30.0 - 36.0 g/dL   RDW 14.7 11.5 - 15.5 %  TSH     Status: None   Collection Time: 02/23/14  7:58 AM  Result Value Ref Range   TSH 3.45 0.35 - 4.50 uIU/mL  Hepatic function panel     Status: Abnormal   Collection Time: 02/23/14  7:58 AM  Result Value Ref Range   Total Bilirubin 0.6 0.2 - 1.2 mg/dL   Bilirubin, Direct 0.1 0.0 - 0.3 mg/dL   Alkaline Phosphatase 85 39 - 117 U/L   AST 35 0 - 37 U/L   ALT 64 (H) 0 - 53 U/L   Total Protein 6.7 6.0 - 8.3 g/dL   Albumin 3.9 3.5 - 5.2 g/dL  PSA, Medicare     Status: None   Collection Time: 02/23/14  7:58 AM  Result Value Ref Range   PSA 1.47 0.10 - 4.00 ng/ml    Assessment/Plan: Tinea cruris Rx Clotrimazole-Betamethasone.  Supportive measures discussed.   Lichen simplex chronicus Secondary to friction from scratching.  The betamethasone in the Lotrisone should help the area.  Apply BID x 2 weeks.

## 2014-03-20 NOTE — Assessment & Plan Note (Signed)
Rx Clotrimazole-Betamethasone.  Supportive measures discussed.

## 2014-03-23 ENCOUNTER — Other Ambulatory Visit: Payer: Self-pay | Admitting: *Deleted

## 2014-03-23 MED ORDER — FEBUXOSTAT 40 MG PO TABS: 40.0000 mg | ORAL_TABLET | Freq: Every day | ORAL | Status: DC

## 2014-03-23 NOTE — Telephone Encounter (Signed)
Uloric refilled per protocol. JG//CMA

## 2014-03-31 ENCOUNTER — Other Ambulatory Visit: Payer: Self-pay | Admitting: Family Medicine

## 2014-04-02 NOTE — Telephone Encounter (Signed)
Med filled.  

## 2014-04-13 ENCOUNTER — Other Ambulatory Visit: Payer: Self-pay | Admitting: Family Medicine

## 2014-04-13 MED ORDER — LORAZEPAM 1 MG PO TABS: ORAL_TABLET | ORAL | Status: DC

## 2014-04-13 NOTE — Telephone Encounter (Signed)
Ok to refill same signature. No additional refills.  They will have to come from PCP.

## 2014-04-13 NOTE — Telephone Encounter (Signed)
Last Office visit with David [name] 03/20/14 Last Office visit with PCP 02/20/14 Last refill 03/02/14 #90 with 0 refills Next Office visit with PCP scheduled for 08/31/14 Advise please on this refill in Dr. Frederik [name] absence. Thanks

## 2014-04-13 NOTE — Telephone Encounter (Signed)
Called Kristopher Oppenheim at [facility] [phone] phoned in refill for Lorazepam 1 mg #90 with 0 refills as instructed ok by Raiford Noble PA covering for PCP.

## 2014-04-18 ENCOUNTER — Other Ambulatory Visit: Payer: Self-pay | Admitting: *Deleted

## 2014-04-18 MED ORDER — LOSARTAN POTASSIUM 25 MG PO TABS: 25.0000 mg | ORAL_TABLET | Freq: Every day | ORAL | Status: DC

## 2014-04-18 NOTE — Telephone Encounter (Signed)
Rx sent to the pharmacy by e-script.//AB/CMA 

## 2014-04-30 ENCOUNTER — Encounter: Payer: Self-pay | Admitting: Physician Assistant

## 2014-04-30 ENCOUNTER — Ambulatory Visit (INDEPENDENT_AMBULATORY_CARE_PROVIDER_SITE_OTHER): Payer: Medicare Other | Admitting: Physician Assistant

## 2014-04-30 VITALS — BP 127/71 | HR 68 | Temp 98.3°F | Resp 16 | Ht 73.0 in | Wt 258.4 lb

## 2014-04-30 DIAGNOSIS — R748 Abnormal levels of other serum enzymes: Secondary | ICD-10-CM

## 2014-04-30 DIAGNOSIS — K2970 Gastritis, unspecified, without bleeding: Secondary | ICD-10-CM

## 2014-04-30 LAB — COMPREHENSIVE METABOLIC PANEL
ALK PHOS: 86 U/L (ref 39–117)
ALT: 131 U/L — ABNORMAL HIGH (ref 0–53)
AST: 78 U/L — AB (ref 0–37)
Albumin: 4 g/dL (ref 3.5–5.2)
BUN: 12 mg/dL (ref 6–23)
CALCIUM: 9.6 mg/dL (ref 8.4–10.5)
CO2: 23 mEq/L (ref 19–32)
CREATININE: 1.03 mg/dL (ref 0.40–1.50)
Chloride: 106 mEq/L (ref 96–112)
GFR: 76.11 mL/min (ref >60.00)
Glucose, Bld: 92 mg/dL (ref 70–99)
Potassium: 4.1 mEq/L (ref 3.5–5.1)
Sodium: 136 mEq/L (ref 135–145)
Total Bilirubin: 0.7 mg/dL (ref 0.2–1.2)
Total Protein: 6.9 g/dL (ref 6.0–8.3)

## 2014-04-30 LAB — CBC WITH DIFFERENTIAL/PLATELET
BASOS ABS: 0 10*3/uL (ref 0.0–0.1)
Basophils Relative: 0.5 % (ref 0.0–3.0)
Eosinophils Absolute: 0.3 10*3/uL (ref 0.0–0.7)
Eosinophils Relative: 3.7 % (ref 0.0–5.0)
HCT: 46.4 % (ref 39.0–52.0)
HEMOGLOBIN: 15.6 g/dL (ref 13.0–17.0)
Lymphocytes Relative: 22 % (ref 12.0–46.0)
Lymphs Abs: 1.6 10*3/uL (ref 0.7–4.0)
MCHC: 33.5 g/dL (ref 30.0–36.0)
MCV: 93.6 fl (ref 78.0–100.0)
MONO ABS: 0.5 10*3/uL (ref 0.1–1.0)
Monocytes Relative: 7.6 % (ref 3.0–12.0)
Neutro Abs: 4.7 10*3/uL (ref 1.4–7.7)
Neutrophils Relative %: 66.2 % (ref 43.0–77.0)
PLATELETS: 248 10*3/uL (ref 150.0–400.0)
RBC: 4.96 Mil/uL (ref 4.22–5.81)
RDW: 14 % (ref 11.5–15.5)
WBC: 7.1 10*3/uL (ref 4.0–10.5)

## 2014-04-30 LAB — LIPASE: LIPASE: 15 U/L (ref 11.0–59.0)

## 2014-04-30 LAB — H. PYLORI ANTIBODY, IGG: H PYLORI IGG: NEGATIVE

## 2014-04-30 MED ORDER — OMEPRAZOLE 20 MG PO CPDR: 20.0000 mg | DELAYED_RELEASE_CAPSULE | Freq: Every day | ORAL | Status: DC

## 2014-04-30 NOTE — Progress Notes (Signed)
Patient presents to clinic today c/o a couple of days of stomach cramping, nausea and loose stools starting 5 days ago.  Endorses stomach cramping has resolved but mild nausea and loose stools has remained. Denies fever, chills or fatigue.  Endorses stool is light brown and yellow in color over the past couple of days. Endorses good urinary output.  Endorses good appetite.  No vomiting with foods.  Endorses good hydration.  Past Medical History  Diagnosis Date  . LAFB (left anterior fascicular block)   . Diverticulitis   . Hypertension   . Premature ventricular complex   . Atrial fibrillation   . Hyperlipidemia   . Gout   . Anxiety   . Back pain   . Internal hemorrhoids   . GERD (gastroesophageal reflux disease)   . Diverticulosis   . BCC (basal cell carcinoma of skin)     under right eye and right ear  . Rectal irritation 09/18/2012  . Other malaise and fatigue 03/13/2013  . Tinea corporis 03/13/2013  . Abnormal liver function 03/18/2013  . IBS (irritable bowel syndrome) 03/18/2013  . Unspecified constipation 09/25/2013  . Medicare annual wellness visit, subsequent 03/12/2014    Current Outpatient Prescriptions on File Prior to Visit  Medication Sig Dispense Refill  . alum hydroxide-mag trisilicate (GAVISCON) 51-02 MG CHEW Chew by mouth.    Marland Kitchen aspirin 81 MG tablet Take 81 mg by mouth daily as needed.     Marland Kitchen atenolol (TENORMIN) 25 MG tablet Take 1 tablet (25 mg total) by mouth 2 (two) times daily. 60 tablet 3  . clotrimazole-betamethasone (LOTRISONE) lotion Apply topically 2 (two) times daily. For 2 weeks. 30 mL 0  . colchicine (COLCRYS) 0.6 MG tablet TAKE 2 TABLETS BY MOUTH AT FIRST SIGN OF GOUT ATTACK, MAY TAKE 1 TABLET 1 HOUR LATER. REPEAT DAILY UNTIL ATTACK IS RESOLVED. 30 tablet 6  . cyclobenzaprine (FLEXERIL) 5 MG tablet Take 1 tablet (5 mg total) by mouth 3 (three) times daily as needed for muscle spasms. 30 tablet 1  . febuxostat (ULORIC) 40 MG tablet Take 1 tablet (40 mg total) by  mouth daily. (Patient taking differently: Take 40 mg by mouth daily as needed. ) 30 tablet 6  . fluticasone (FLONASE) 50 MCG/ACT nasal spray Place 2 sprays into the nose daily as needed. For congestion    . HYDROcodone-acetaminophen (NORCO/VICODIN) 5-325 MG per tablet Take 1 tablet by mouth every 6 (six) hours as needed. 30 tablet 0  . LORazepam (ATIVAN) 1 MG tablet TAKE 1 TABLET BY MOUTH EVERY 8 HOURS AS NEEDED FOR ANXIETY OR SLEEP 90 tablet 0  . losartan (COZAAR) 25 MG tablet Take 1 tablet (25 mg total) by mouth daily. 30 tablet 5  . meloxicam (MOBIC) 15 MG tablet PRN As directed    . neomycin-polymyxin-hydrocortisone (CORTISPORIN) otic solution Place 3 drops into both ears 3 (three) times daily. 10 mL 0  . PROCTOZONE-HC 2.5 % rectal cream Place 1 application rectally as needed.     . sertraline (ZOLOFT) 50 MG tablet TAKE 1 TABLET BY MOUTH EVERY EVENING 90 tablet 1  . triamcinolone (KENALOG) 0.025 % ointment Apply 1 application topically 2 (two) times daily. 30 g 0  . atorvastatin (LIPITOR) 40 MG tablet Take 20 mg by mouth 2 (two) times daily.     Marland Kitchen zoster vaccine live, PF, (ZOSTAVAX) 58527 UNT/0.65ML injection Inject 19,400 Units into the skin once. (Patient not taking: Reported on 04/30/2014) 1 each 0   No current facility-administered medications  on file prior to visit.    Allergies  Allergen Reactions  . Cefaclor Hives  . Cephalosporins Hives  . Penicillins Rash    Mild maculopapular rash    Family History  Problem Relation Age of Onset  . Heart disease Mother   . Cancer Mother     colon, breast  . Hypertension Mother   . Stroke Mother   . Heart disease Father     pacemaker  . Aortic aneurysm Father   . Hypertension Father   . Heart disease Sister   . Atrial fibrillation Sister   . Obesity Sister   . Sleep apnea Sister   . Heart attack Brother   . Other Brother     muscle disease  . Arthritis Brother   . Stroke Brother   . Cancer Maternal Grandmother     ?  Marland Kitchen Heart  attack Maternal Grandmother   . Diabetes Maternal Grandmother   . Cancer Maternal Grandfather     hodgin's lymphoma  . Heart attack Paternal Grandmother   . Anxiety disorder Paternal Grandmother   . Pneumonia Paternal Grandfather   . Heart attack Brother   . Atrial fibrillation Brother   . Heart attack Brother   . Hypertension Brother   . Hyperlipidemia Brother   . Heart attack Brother   . Other Brother     heart valve operation    History   Social History  . Marital Status: Married    Spouse Name: N/A  . Number of Children: 3  . Years of Education: N/A   Occupational History  . [employer]    Social History Main Topics  . Smoking status: Former Smoker -- 1.00 packs/day for 6 years    Types: Cigarettes    Start date: 01/12/1969  . Smokeless tobacco: Never Used  . Alcohol Use: Yes     Comment: 2 drinks every night  . Drug Use: No  . Sexual Activity: Yes   Other Topics Concern  . None   Social History Narrative   Review of Systems - See HPI.  All other ROS are negative.  BP 127/71 mmHg  Pulse 68  Temp(Src) 98.3 F (36.8 C) (Oral)  Resp 16  Ht 6\' 1"  (1.854 m)  Wt 258 lb 6 oz (117.198 kg)  BMI 34.10 kg/m2  SpO2 97%  Physical Exam  Constitutional: He is oriented to person, place, and time and well-developed, well-nourished, and in no distress.  HENT:  Head: Normocephalic and atraumatic.  Cardiovascular: Normal rate, regular rhythm, normal heart sounds and intact distal pulses.   Pulmonary/Chest: Effort normal and breath sounds normal. No respiratory distress. He has no wheezes. He has no rales. He exhibits no tenderness.  Abdominal: Soft. Bowel sounds are normal. He exhibits no distension and no mass. There is no tenderness. There is no rebound and no guarding.  Neurological: He is alert and oriented to person, place, and time.  Skin: Skin is warm and dry. No rash noted.  Vitals reviewed.   Recent Results (from the past 2160 hour(s))  Lipid panel     Status:  Abnormal   Collection Time: 02/23/14  7:58 AM  Result Value Ref Range   Cholesterol 168 0 - 200 mg/dL    Comment: ATP III Classification       Desirable:  < 200 mg/dL               Borderline High:  200 - 239 mg/dL  High:  > = 240 mg/dL   Triglycerides 122.0 0.0 - 149.0 mg/dL    Comment: Normal:  <150 mg/dLBorderline High:  150 - 199 mg/dL   HDL 36.70 (L) >39.00 mg/dL   VLDL 24.4 0.0 - 40.0 mg/dL   LDL Cholesterol 107 (H) 0 - 99 mg/dL   Total CHOL/HDL Ratio 5     Comment:                Men          Women1/2 Average Risk     3.4          3.3Average Risk          5.0          4.42X Average Risk          9.6          7.13X Average Risk          15.0          11.0                       NonHDL 131.30     Comment: NOTE:  Non-HDL goal should be 30 mg/dL higher than patient's LDL goal (i.e. LDL goal of < 70 mg/dL, would have non-HDL goal of < 100 mg/dL)  Renal Function Panel     Status: Abnormal   Collection Time: 02/23/14  7:58 AM  Result Value Ref Range   Sodium 140 135 - 145 mEq/L   Potassium 4.3 3.5 - 5.1 mEq/L   Chloride 106 96 - 112 mEq/L   CO2 28 19 - 32 mEq/L   Calcium 9.9 8.4 - 10.5 mg/dL   Albumin 3.9 3.5 - 5.2 g/dL   BUN 14 6 - 23 mg/dL   Creatinine, Ser 1.30 0.40 - 1.50 mg/dL   Glucose, Bld 100 (H) 70 - 99 mg/dL   Phosphorus 3.7 2.3 - 4.6 mg/dL   GFR 58.21 (L) >60.00 mL/min  CBC     Status: None   Collection Time: 02/23/14  7:58 AM  Result Value Ref Range   WBC 7.0 4.0 - 10.5 K/uL   RBC 4.88 4.22 - 5.81 Mil/uL   Platelets 212.0 150.0 - 400.0 K/uL   Hemoglobin 15.4 13.0 - 17.0 g/dL   HCT 45.8 39.0 - 52.0 %   MCV 94.0 78.0 - 100.0 fl   MCHC 33.5 30.0 - 36.0 g/dL   RDW 14.7 11.5 - 15.5 %  TSH     Status: None   Collection Time: 02/23/14  7:58 AM  Result Value Ref Range   TSH 3.45 0.35 - 4.50 uIU/mL  Hepatic function panel     Status: Abnormal   Collection Time: 02/23/14  7:58 AM  Result Value Ref Range   Total Bilirubin 0.6 0.2 - 1.2 mg/dL   Bilirubin, Direct  0.1 0.0 - 0.3 mg/dL   Alkaline Phosphatase 85 39 - 117 U/L   AST 35 0 - 37 U/L   ALT 64 (H) 0 - 53 U/L   Total Protein 6.7 6.0 - 8.3 g/dL   Albumin 3.9 3.5 - 5.2 g/dL  PSA, Medicare     Status: None   Collection Time: 02/23/14  7:58 AM  Result Value Ref Range   PSA 1.47 0.10 - 4.00 ng/ml    Assessment/Plan: Gastritis Rx Prilosec. Exam with hyperactive bowel sounds but otherwise unremarkable. No tenderness.  Giving stool color and symptoms will check labs to include --  CBC, CMP, Lipase, H. Pylori and Stool studies. Diet for diarrhea discussed. Supportive measures discussed.

## 2014-04-30 NOTE — Assessment & Plan Note (Signed)
Rx Prilosec. Exam with hyperactive bowel sounds but otherwise unremarkable. No tenderness.  Giving stool color and symptoms will check labs to include -- CBC, CMP, Lipase, H. Pylori and Stool studies. Diet for diarrhea discussed. Supportive measures discussed.

## 2014-04-30 NOTE — Progress Notes (Signed)
Pre visit review using our clinic review tool, if applicable. No additional management support is needed unless otherwise documented below in the visit note/SLS  

## 2014-04-30 NOTE — Patient Instructions (Signed)
Please stop by the lab for blood work. I will call you with your results.  Stay well hydrated and resume foods when you feel up to it. Take the Prilosec daily as directed. Read and follow the diet below to help with stools. If any new symptoms occur while workup is in process, please return to clinic.  We will treat based on your results.  Food Choices to Help Relieve Diarrhea When you have diarrhea, the foods you eat and your eating habits are very important. Choosing the right foods and drinks can help relieve diarrhea. Also, because diarrhea can last up to 7 days, you need to replace lost fluids and electrolytes (such as sodium, potassium, and chloride) in order to help prevent dehydration.  WHAT GENERAL GUIDELINES DO I NEED TO FOLLOW?  Slowly drink 1 cup (8 oz) of fluid for each episode of diarrhea. If you are getting enough fluid, your urine will be clear or pale yellow.  Eat starchy foods. Some good choices include white rice, white toast, pasta, low-fiber cereal, baked potatoes (without the skin), saltine crackers, and bagels.  Avoid large servings of any cooked vegetables.  Limit fruit to two servings per day. A serving is  cup or 1 small piece.  Choose foods with less than 2 g of fiber per serving.  Limit fats to less than 8 tsp (38 g) per day.  Avoid fried foods.  Eat foods that have probiotics in them. Probiotics can be found in certain dairy products.  Avoid foods and beverages that may increase the speed at which food moves through the stomach and intestines (gastrointestinal tract). Things to avoid include:  High-fiber foods, such as dried fruit, raw fruits and vegetables, nuts, seeds, and whole grain foods.  Spicy foods and high-fat foods.  Foods and beverages sweetened with high-fructose corn syrup, honey, or sugar alcohols such as xylitol, sorbitol, and mannitol. WHAT FOODS ARE RECOMMENDED? Grains White rice. White, [place], or pita breads (fresh or toasted),  including plain rolls, buns, or bagels. White pasta. Saltine, soda, or graham crackers. Pretzels. Low-fiber cereal. Cooked cereals made with water (such as cornmeal, farina, or cream cereals). Plain muffins. Matzo. Melba toast. Zwieback.  Vegetables Potatoes (without the skin). Strained tomato and vegetable juices. Most well-cooked and canned vegetables without seeds. Tender lettuce. Fruits Cooked or canned applesauce, apricots, cherries, fruit cocktail, grapefruit, peaches, pears, or plums. Fresh bananas, apples without skin, cherries, grapes, cantaloupe, grapefruit, peaches, oranges, or plums.  Meat and Other Protein Products Baked or boiled chicken. Eggs. Tofu. Fish. Seafood. Smooth peanut butter. Ground or well-cooked tender beef, ham, veal, lamb, pork, or poultry.  Dairy Plain yogurt, kefir, and unsweetened liquid yogurt. Lactose-free milk, buttermilk, or soy milk. Plain hard cheese. Beverages Sport drinks. Clear broths. Diluted fruit juices (except prune). Regular, caffeine-free sodas such as ginger ale. Water. Decaffeinated teas. Oral rehydration solutions. Sugar-free beverages not sweetened with sugar alcohols. Other Bouillon, broth, or soups made from recommended foods.  The items listed above may not be a complete list of recommended foods or beverages. Contact your dietitian for more options. WHAT FOODS ARE NOT RECOMMENDED? Grains Whole grain, whole wheat, bran, or rye breads, rolls, pastas, crackers, and cereals. Wild or brown rice. Cereals that contain more than 2 g of fiber per serving. Corn tortillas or taco shells. Cooked or dry oatmeal. Granola. Popcorn. Vegetables Raw vegetables. Cabbage, broccoli, Brussels sprouts, artichokes, baked beans, beet greens, corn, kale, legumes, peas, sweet potatoes, and yams. Potato skins. Cooked spinach and cabbage. Fruits  Dried fruit, including raisins and dates. Raw fruits. Stewed or dried prunes. Fresh apples with skin, apricots, mangoes, pears,  raspberries, and strawberries.  Meat and Other Protein Products Chunky peanut butter. Nuts and seeds. Beans and lentils. Berniece Salines.  Dairy High-fat cheeses. Milk, chocolate milk, and beverages made with milk, such as milk shakes. Cream. Ice cream. Sweets and Desserts Sweet rolls, doughnuts, and sweet breads. Pancakes and waffles. Fats and Oils Butter. Cream sauces. Margarine. Salad oils. Plain salad dressings. Olives. Avocados.  Beverages Caffeinated beverages (such as coffee, tea, soda, or energy drinks). Alcoholic beverages. Fruit juices with pulp. Prune juice. Soft drinks sweetened with high-fructose corn syrup or sugar alcohols. Other Coconut. Hot sauce. Chili powder. Mayonnaise. Gravy. Cream-based or milk-based soups.  The items listed above may not be a complete list of foods and beverages to avoid. Contact your dietitian for more information. WHAT SHOULD I DO IF I BECOME DEHYDRATED? Diarrhea can sometimes lead to dehydration. Signs of dehydration include dark urine and dry mouth and skin. If you think you are dehydrated, you should rehydrate with an oral rehydration solution. These solutions can be purchased at pharmacies, retail stores, or online.  Drink -1 cup (120-240 mL) of oral rehydration solution each time you have an episode of diarrhea. If drinking this amount makes your diarrhea worse, try drinking smaller amounts more often. For example, drink 1-3 tsp (5-15 mL) every 5-10 minutes.  A general rule for staying hydrated is to drink 1-2 L of fluid per day. Talk to your health care provider about the specific amount you should be drinking each day. Drink enough fluids to keep your urine clear or pale yellow. Document Released: 03/21/2003 Document Revised: 01/03/2013 Document Reviewed: 11/21/2012 [facility] Patient Information 2015 [place], [place]. This information is not intended to replace advice given to you by your health care provider. Make sure you discuss any questions you have with  your health care provider.

## 2014-05-01 NOTE — Addendum Note (Signed)
Addended by: Peggyann Shoals on: 05/01/2014 11:32 AM   Modules accepted: Orders

## 2014-05-02 LAB — C. DIFFICILE GDH AND TOXIN A/B
C. DIFF TOXIN A/B: NOT DETECTED
C. difficile GDH: NOT DETECTED

## 2014-05-03 LAB — OVA AND PARASITE EXAMINATION: OP: NONE SEEN

## 2014-05-04 ENCOUNTER — Other Ambulatory Visit (INDEPENDENT_AMBULATORY_CARE_PROVIDER_SITE_OTHER): Payer: Medicare Other

## 2014-05-04 DIAGNOSIS — R748 Abnormal levels of other serum enzymes: Secondary | ICD-10-CM

## 2014-05-04 LAB — HEPATIC FUNCTION PANEL
ALBUMIN: 3.8 g/dL (ref 3.5–5.2)
ALK PHOS: 81 U/L (ref 39–117)
ALT: 80 U/L — ABNORMAL HIGH (ref 0–53)
AST: 42 U/L — AB (ref 0–37)
BILIRUBIN DIRECT: 0.1 mg/dL (ref 0.0–0.3)
Total Bilirubin: 0.5 mg/dL (ref 0.2–1.2)
Total Protein: 6.6 g/dL (ref 6.0–8.3)

## 2014-05-04 LAB — HEPATITIS PANEL, ACUTE
HCV Ab: NEGATIVE
HEP B S AG: NEGATIVE
Hep A IgM: NONREACTIVE
Hep B C IgM: NONREACTIVE

## 2014-05-05 LAB — STOOL CULTURE

## 2014-05-07 ENCOUNTER — Telehealth: Payer: Self-pay | Admitting: Family Medicine

## 2014-05-07 NOTE — Telephone Encounter (Signed)
PATIENT WANTS HIS LAB RESULTS

## 2014-05-07 NOTE — Telephone Encounter (Signed)
Patient has already been called once and message left by CMA Scates.   David [name] please attempt to contact patient again.

## 2014-05-07 NOTE — Telephone Encounter (Signed)
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

## 2014-05-07 NOTE — Telephone Encounter (Signed)
Notes Recorded by Rockwell Germany, CMA on 05/07/2014 at 2:20 PM .[facility] with contact name and number for return call RE: results and schedule 1-Mth Lab appt to recheck LFT per provider instructions/SLS  Notes Recorded by Brunetta Jeans, PA-C on 05/07/2014 at 7:03 AM Labs and stool studies unremarkable. No sign of acute hepatitis. Liver enzymes are trending back down to normal. Would like to recheck in 1 month to make sure they are completely normal again. How is he feeling? Notes Recorded by Peggyann Shoals, RMA on 05/01/2014 at 11:33 AM Pt notified / pt agrees and verbalized understanding. Pt scheduled Friday (lab appointment) acute hep and LFT. Notes Recorded by Brunetta Jeans, PA-C on 05/01/2014 at 7:04 AM Liver enzymes elevated but only mildly -- likely secondary to recent symptoms. Would like him to return Friday to lab for repeat LFTs and Acute hepatitis panel. Liver enzymes should trend back down to normal, but we want to make sure. He needs to stay hydrated and avoid alcohol and tylenol-containing products. If still elevated on recheck, would recommend [place] of his abdomen.

## 2014-06-04 ENCOUNTER — Encounter: Payer: Self-pay | Admitting: Physician Assistant

## 2014-06-04 ENCOUNTER — Ambulatory Visit (INDEPENDENT_AMBULATORY_CARE_PROVIDER_SITE_OTHER): Payer: Medicare Other | Admitting: Physician Assistant

## 2014-06-04 VITALS — BP 139/71 | HR 62 | Temp 98.1°F | Ht 73.0 in | Wt 262.4 lb

## 2014-06-04 DIAGNOSIS — D1720 Benign lipomatous neoplasm of skin and subcutaneous tissue of unspecified limb: Secondary | ICD-10-CM | POA: Diagnosis not present

## 2014-06-04 NOTE — Assessment & Plan Note (Signed)
Bilateral.  Reassurance given to patient.  Giving his concerns will proceed with [place] to further assess.  Per Radiology will need to order Breast US with axilla at [place]. Order placed.

## 2014-06-04 NOTE — Progress Notes (Signed)
Patient presents to clinic today c/o lumps under armpits bilaterally first noticed about a year ago, but patient decided to have them checked out.  Patient does endorse weight gain over the past couple of years and states these "lumps" look fat-like.  Denies redness, tenderness, swelling or drainage.  Denies change in size or texture of these areas.  Denies fever, chills, sweats or unexplained weight changes.  Past Medical History  Diagnosis Date  . LAFB (left anterior fascicular block)   . Diverticulitis   . Hypertension   . Premature ventricular complex   . Atrial fibrillation   . Hyperlipidemia   . Gout   . Anxiety   . Back pain   . Internal hemorrhoids   . GERD (gastroesophageal reflux disease)   . Diverticulosis   . BCC (basal cell carcinoma of skin)     under right eye and right ear  . Rectal irritation 09/18/2012  . Other malaise and fatigue 03/13/2013  . Tinea corporis 03/13/2013  . Abnormal liver function 03/18/2013  . IBS (irritable bowel syndrome) 03/18/2013  . Unspecified constipation 09/25/2013  . Medicare annual wellness visit, subsequent 03/12/2014    Current Outpatient Prescriptions on File Prior to Visit  Medication Sig Dispense Refill  . alum hydroxide-mag trisilicate (GAVISCON) 80-20 MG CHEW Chew by mouth.    Marland Kitchen aspirin 81 MG tablet Take 81 mg by mouth daily as needed.     Marland Kitchen atenolol (TENORMIN) 25 MG tablet Take 1 tablet (25 mg total) by mouth 2 (two) times daily. 60 tablet 3  . atorvastatin (LIPITOR) 40 MG tablet Take 20 mg by mouth 2 (two) times daily.     . clotrimazole-betamethasone (LOTRISONE) lotion Apply topically 2 (two) times daily. For 2 weeks. 30 mL 0  . cyclobenzaprine (FLEXERIL) 5 MG tablet Take 1 tablet (5 mg total) by mouth 3 (three) times daily as needed for muscle spasms. 30 tablet 1  . fluticasone (FLONASE) 50 MCG/ACT nasal spray Place 2 sprays into the nose daily as needed. For congestion    . HYDROcodone-acetaminophen (NORCO/VICODIN) 5-325 MG per  tablet Take 1 tablet by mouth every 6 (six) hours as needed. 30 tablet 0  . LORazepam (ATIVAN) 1 MG tablet TAKE 1 TABLET BY MOUTH EVERY 8 HOURS AS NEEDED FOR ANXIETY OR SLEEP 90 tablet 0  . losartan (COZAAR) 25 MG tablet Take 1 tablet (25 mg total) by mouth daily. 30 tablet 5  . meloxicam (MOBIC) 15 MG tablet PRN As directed    . neomycin-polymyxin-hydrocortisone (CORTISPORIN) otic solution Place 3 drops into both ears 3 (three) times daily. 10 mL 0  . omeprazole (PRILOSEC) 20 MG capsule Take 1 capsule (20 mg total) by mouth daily. 30 capsule 3  . PROCTOZONE-HC 2.5 % rectal cream Place 1 application rectally as needed.     . sertraline (ZOLOFT) 50 MG tablet TAKE 1 TABLET BY MOUTH EVERY EVENING 90 tablet 1  . triamcinolone (KENALOG) 0.025 % ointment Apply 1 application topically 2 (two) times daily. 30 g 0  . zoster vaccine live, PF, (ZOSTAVAX) 61031 UNT/0.65ML injection Inject 19,400 Units into the skin once. 1 each 0  . colchicine (COLCRYS) 0.6 MG tablet TAKE 2 TABLETS BY MOUTH AT FIRST SIGN OF GOUT ATTACK, MAY TAKE 1 TABLET 1 HOUR LATER. REPEAT DAILY UNTIL ATTACK IS RESOLVED. (Patient not taking: Reported on 06/04/2014) 30 tablet 6  . febuxostat (ULORIC) 40 MG tablet Take 1 tablet (40 mg total) by mouth daily. (Patient not taking: Reported on 06/04/2014) 30  tablet 6   No current facility-administered medications on file prior to visit.    Allergies  Allergen Reactions  . Cefaclor Hives  . Cephalosporins Hives  . Penicillins Rash    Mild maculopapular rash    Family History  Problem Relation Age of Onset  . Heart disease Mother   . Cancer Mother     colon, breast  . Hypertension Mother   . Stroke Mother   . Heart disease Father     pacemaker  . Aortic aneurysm Father   . Hypertension Father   . Heart disease Sister   . Atrial fibrillation Sister   . Obesity Sister   . Sleep apnea Sister   . Heart attack Brother   . Other Brother     muscle disease  . Arthritis Brother   .  Stroke Brother   . Cancer Maternal Grandmother     ?  Marland Kitchen Heart attack Maternal Grandmother   . Diabetes Maternal Grandmother   . Cancer Maternal Grandfather     hodgin's lymphoma  . Heart attack Paternal Grandmother   . Anxiety disorder Paternal Grandmother   . Pneumonia Paternal Grandfather   . Heart attack Brother   . Atrial fibrillation Brother   . Heart attack Brother   . Hypertension Brother   . Hyperlipidemia Brother   . Heart attack Brother   . Other Brother     heart valve operation    History   Social History  . Marital Status: Married    Spouse Name: N/A  . Number of Children: 3  . Years of Education: N/A   Occupational History  . [employer]    Social History Main Topics  . Smoking status: Former Smoker -- 1.00 packs/day for 6 years    Types: Cigarettes    Start date: 01/12/1969  . Smokeless tobacco: Never Used  . Alcohol Use: Yes     Comment: 2 drinks every night  . Drug Use: No  . Sexual Activity: Yes   Other Topics Concern  . None   Social History Narrative   Review of Systems - See HPI.  All other ROS are negative.  BP 139/71 mmHg  Pulse 62  Temp(Src) 98.1 F (36.7 C) (Oral)  Ht $R'6\' 1"'Os$  (1.854 m)  Wt 262 lb 6.4 oz (119.024 kg)  BMI 34.63 kg/m2  SpO2 96%  Physical Exam  Constitutional: He is oriented to person, place, and time and well-developed, well-nourished, and in no distress.  HENT:  Head: Normocephalic and atraumatic.  Eyes: Conjunctivae are normal. Pupils are equal, round, and reactive to light.  Neck: Neck supple.  Cardiovascular: Normal rate, regular rhythm, normal heart sounds and intact distal pulses.   Pulmonary/Chest: Effort normal and breath sounds normal. No respiratory distress. He has no wheezes. He has no rales. He exhibits no tenderness.  Neurological: He is alert and oriented to person, place, and time.  Skin: Skin is warm and dry. No rash noted.     Psychiatric: Affect normal.  Vitals reviewed.   Recent Results (from  the past 2160 hour(s))  CBC w/Diff     Status: None   Collection Time: 04/30/14 11:45 AM  Result Value Ref Range   WBC 7.1 4.0 - 10.5 K/uL   RBC 4.96 4.22 - 5.81 Mil/uL   Hemoglobin 15.6 13.0 - 17.0 g/dL   HCT 46.4 39.0 - 52.0 %   MCV 93.6 78.0 - 100.0 fl   MCHC 33.5 30.0 - 36.0 g/dL   RDW  14.0 11.5 - 15.5 %   Platelets 248.0 150.0 - 400.0 K/uL   Neutrophils Relative % 66.2 43.0 - 77.0 %   Lymphocytes Relative 22.0 12.0 - 46.0 %   Monocytes Relative 7.6 3.0 - 12.0 %   Eosinophils Relative 3.7 0.0 - 5.0 %   Basophils Relative 0.5 0.0 - 3.0 %   Neutro Abs 4.7 1.4 - 7.7 K/uL   Lymphs Abs 1.6 0.7 - 4.0 K/uL   Monocytes Absolute 0.5 0.1 - 1.0 K/uL   Eosinophils Absolute 0.3 0.0 - 0.7 K/uL   Basophils Absolute 0.0 0.0 - 0.1 K/uL  Comp Met (CMET)     Status: Abnormal   Collection Time: 04/30/14 11:45 AM  Result Value Ref Range   Sodium 136 135 - 145 mEq/L   Potassium 4.1 3.5 - 5.1 mEq/L   Chloride 106 96 - 112 mEq/L   CO2 23 19 - 32 mEq/L   Glucose, Bld 92 70 - 99 mg/dL   BUN 12 6 - 23 mg/dL   Creatinine, Ser 1.03 0.40 - 1.50 mg/dL   Total Bilirubin 0.7 0.2 - 1.2 mg/dL   Alkaline Phosphatase 86 39 - 117 U/L   AST 78 (H) 0 - 37 U/L   ALT 131 (H) 0 - 53 U/L   Total Protein 6.9 6.0 - 8.3 g/dL   Albumin 4.0 3.5 - 5.2 g/dL   Calcium 9.6 8.4 - 10.5 mg/dL   GFR 76.11 >60.00 mL/min  Lipase     Status: None   Collection Time: 04/30/14 11:45 AM  Result Value Ref Range   Lipase 15.0 11.0 - 59.0 U/L  H. pylori antibody, IgG     Status: None   Collection Time: 04/30/14 11:45 AM  Result Value Ref Range   H Pylori IgG Negative Negative  Stool Culture     Status: None   Collection Time: 05/01/14 11:22 AM  Result Value Ref Range   Organism ID, Bacteria No Salmonella,Shigella,Campylobacter,Yersinia,or    Organism ID, Bacteria No E.coli [id number] isolated.   C. difficile GDH and Toxin A/B     Status: None   Collection Time: 05/01/14 11:22 AM  Result Value Ref Range   C. difficile GDH Not  Detected    C. difficile Toxin A/B Not Detected     Comment: No Toxigenic C. difficile Detected   Source Stool   Ova and parasite examination     Status: None   Collection Time: 05/01/14 11:22 AM  Result Value Ref Range   OP No Ova or Parasites Seen    Hepatic function panel     Status: Abnormal   Collection Time: 05/04/14  8:03 AM  Result Value Ref Range   Total Bilirubin 0.5 0.2 - 1.2 mg/dL   Bilirubin, Direct 0.1 0.0 - 0.3 mg/dL   Alkaline Phosphatase 81 39 - 117 U/L   AST 42 (H) 0 - 37 U/L   ALT 80 (H) 0 - 53 U/L   Total Protein 6.6 6.0 - 8.3 g/dL   Albumin 3.8 3.5 - 5.2 g/dL  Hepatitis panel, acute     Status: None   Collection Time: 05/04/14  8:03 AM  Result Value Ref Range   Hepatitis B Surface Ag NEGATIVE NEGATIVE   HCV Ab NEGATIVE NEGATIVE   Hep B C IgM NON REACTIVE NON REACTIVE    Comment: High levels of Hepatitis B Core IgM antibody are detectable during the acute stage of Hepatitis B. This antibody is used to differentiate current from past  HBV infection.      Hep A IgM NON REACTIVE NON REACTIVE    Comment:   Effective November 27, 2013, Hepatitis Acute Panel (test code [phone]) will be revised to automatically reflex to the Hepatitis C Viral RNA, Quantitative, Real-Time PCR assay if the Hepatitis C antibody screening result is Reactive. This action is being taken to ensure that the CDC/USPSTF recommended HCV diagnostic algorithm with the appropriate test reflex needed for accurate interpretation is followed.       Assessment/Plan: Lipoma of axilla Bilateral.  Reassurance given to patient.  Giving his concerns will proceed with [place] to further assess.  Per Radiology will need to order Breast US with axilla at [place]. Order placed.

## 2014-06-04 NOTE — Progress Notes (Signed)
Pre visit review using our clinic review tool, if applicable. No additional management support is needed unless otherwise documented below in the visit note. 

## 2014-06-04 NOTE — Patient Instructions (Signed)
You will be contacted to schedule an Ultrasound to further assess the area.  Again your exam is consistent with fat deposits.  I do not see or feel anything concerning for cancer, etc, but the [place] will tell us more.

## 2014-06-21 ENCOUNTER — Other Ambulatory Visit: Payer: Self-pay | Admitting: Family Medicine

## 2014-06-21 MED ORDER — LORAZEPAM 1 MG PO TABS: ORAL_TABLET | ORAL | Status: DC

## 2014-06-21 NOTE — Telephone Encounter (Signed)
Faxed hardcopy for Lorazepam to [facility]

## 2014-06-21 NOTE — Addendum Note (Signed)
Addended by: Sharon Seller B on: 06/21/2014 03:07 PM   Modules accepted: Orders

## 2014-06-21 NOTE — Telephone Encounter (Signed)
Requesting:  LORAZEPAM  Contract  SIGNED ON 03/02/14 UDS  GIVEN ON 03/02/14 LOW RISK Last OV   03/02/14 AND NEXT SCHEDULED OV 08/30/14 Last Refill  #90 WITH 0  04/13/14  Please Advise

## 2014-07-02 ENCOUNTER — Other Ambulatory Visit: Payer: Self-pay | Admitting: Family Medicine

## 2014-07-02 MED ORDER — ATENOLOL 25 MG PO TABS: 25.0000 mg | ORAL_TABLET | Freq: Two times a day (BID) | ORAL | Status: DC

## 2014-08-20 ENCOUNTER — Other Ambulatory Visit: Payer: Self-pay | Admitting: Physician Assistant

## 2014-08-20 NOTE — Telephone Encounter (Signed)
Will defer further refills to PCP. 

## 2014-08-21 ENCOUNTER — Other Ambulatory Visit: Payer: Self-pay | Admitting: Physician Assistant

## 2014-08-21 NOTE — Telephone Encounter (Signed)
Will defer further refills to PCP. 

## 2014-08-22 ENCOUNTER — Other Ambulatory Visit: Payer: Self-pay | Admitting: Physician Assistant

## 2014-08-24 ENCOUNTER — Other Ambulatory Visit (INDEPENDENT_AMBULATORY_CARE_PROVIDER_SITE_OTHER): Payer: Medicare Other

## 2014-08-24 DIAGNOSIS — I4891 Unspecified atrial fibrillation: Secondary | ICD-10-CM

## 2014-08-24 DIAGNOSIS — E782 Mixed hyperlipidemia: Secondary | ICD-10-CM | POA: Diagnosis not present

## 2014-08-24 DIAGNOSIS — Z1211 Encounter for screening for malignant neoplasm of colon: Secondary | ICD-10-CM

## 2014-08-24 DIAGNOSIS — I10 Essential (primary) hypertension: Secondary | ICD-10-CM | POA: Diagnosis not present

## 2014-08-24 DIAGNOSIS — M109 Gout, unspecified: Secondary | ICD-10-CM | POA: Diagnosis not present

## 2014-08-24 DIAGNOSIS — R52 Pain, unspecified: Secondary | ICD-10-CM

## 2014-08-27 LAB — CBC
HCT: 44.4 % (ref 39.0–52.0)
Hemoglobin: 14.8 g/dL (ref 13.0–17.0)
MCHC: 33.3 g/dL (ref 30.0–36.0)
MCV: 94.1 fl (ref 78.0–100.0)
Platelets: 197 10*3/uL (ref 150.0–400.0)
RBC: 4.72 Mil/uL (ref 4.22–5.81)
RDW: 14.5 % (ref 11.5–15.5)
WBC: 6.4 10*3/uL (ref 4.0–10.5)

## 2014-08-27 LAB — COMPREHENSIVE METABOLIC PANEL
ALK PHOS: 74 U/L (ref 39–117)
ALT: 45 U/L (ref 0–53)
AST: 30 U/L (ref 0–37)
Albumin: 3.8 g/dL (ref 3.5–5.2)
BILIRUBIN TOTAL: 0.5 mg/dL (ref 0.2–1.2)
BUN: 11 mg/dL (ref 6–23)
CO2: 27 mEq/L (ref 19–32)
CREATININE: 1.17 mg/dL (ref 0.40–1.50)
Calcium: 9.5 mg/dL (ref 8.4–10.5)
Chloride: 107 mEq/L (ref 96–112)
GFR: 65.64 mL/min (ref >60.00)
GLUCOSE: 112 mg/dL — AB (ref 70–99)
Potassium: 4 mEq/L (ref 3.5–5.1)
Sodium: 141 mEq/L (ref 135–145)
TOTAL PROTEIN: 6.6 g/dL (ref 6.0–8.3)

## 2014-08-27 LAB — LIPID PANEL
CHOLESTEROL: 170 mg/dL (ref 0–200)
HDL: 32.8 mg/dL — ABNORMAL LOW (ref >39.00)
LDL Cholesterol: 117 mg/dL — ABNORMAL HIGH (ref 0–99)
NonHDL: 137.42
TRIGLYCERIDES: 103 mg/dL (ref 0.0–149.0)
Total CHOL/HDL Ratio: 5
VLDL: 20.6 mg/dL (ref 0.0–40.0)

## 2014-08-27 LAB — TSH: TSH: 3.55 u[IU]/mL (ref 0.35–4.50)

## 2014-08-27 LAB — URIC ACID: Uric Acid, Serum: 7.2 mg/dL (ref 4.0–7.8)

## 2014-08-30 ENCOUNTER — Ambulatory Visit (INDEPENDENT_AMBULATORY_CARE_PROVIDER_SITE_OTHER): Payer: Medicare Other | Admitting: Family Medicine

## 2014-08-30 ENCOUNTER — Encounter: Payer: Self-pay | Admitting: Family Medicine

## 2014-08-30 VITALS — BP 120/80 | HR 73 | Temp 98.4°F | Ht 75.0 in | Wt 266.0 lb

## 2014-08-30 DIAGNOSIS — M109 Gout, unspecified: Secondary | ICD-10-CM

## 2014-08-30 DIAGNOSIS — I10 Essential (primary) hypertension: Secondary | ICD-10-CM | POA: Diagnosis not present

## 2014-08-30 DIAGNOSIS — Z23 Encounter for immunization: Secondary | ICD-10-CM

## 2014-08-30 DIAGNOSIS — E782 Mixed hyperlipidemia: Secondary | ICD-10-CM

## 2014-08-30 DIAGNOSIS — E663 Overweight: Secondary | ICD-10-CM

## 2014-08-30 DIAGNOSIS — K5900 Constipation, unspecified: Secondary | ICD-10-CM

## 2014-08-30 DIAGNOSIS — N289 Disorder of kidney and ureter, unspecified: Secondary | ICD-10-CM

## 2014-08-30 DIAGNOSIS — E559 Vitamin D deficiency, unspecified: Secondary | ICD-10-CM

## 2014-08-30 MED ORDER — LORAZEPAM 1 MG PO TABS: ORAL_TABLET | ORAL | Status: DC

## 2014-08-30 MED ORDER — ZOSTER VACCINE LIVE 19400 UNT/0.65ML ~~LOC~~ SOLR: 0.6500 mL | Freq: Once | SUBCUTANEOUS | Status: DC

## 2014-08-30 NOTE — Patient Instructions (Signed)

## 2014-08-30 NOTE — Progress Notes (Signed)
Pre visit review using our clinic review tool, if applicable. No additional management support is needed unless otherwise documented below in the visit note. 

## 2014-08-31 ENCOUNTER — Ambulatory Visit: Payer: Self-pay | Admitting: Family Medicine

## 2014-09-09 NOTE — Assessment & Plan Note (Signed)
Tolerating statin, encouraged heart healthy diet, avoid trans fats, minimize simple carbs and saturated fats. Increase exercise as tolerated 

## 2014-09-09 NOTE — Assessment & Plan Note (Signed)
Encouraged increased hydration and fiber in diet. Daily probiotics. If bowels not moving can use MOM 2 tbls po in 4 oz of warm prune juice by mouth every 2-3 days. If no results then repeat in 4 hours with  Dulcolax suppository pr, may repeat again in 4 more hours as needed. Seek care if symptoms worsen. Consider daily Miralax and/or Dulcolax if symptoms persist.  

## 2014-09-09 NOTE — Assessment & Plan Note (Signed)
Doing well on Uloric, maintain adequate hydration

## 2014-09-09 NOTE — Assessment & Plan Note (Signed)
Encouraged DASH diet, decrease po intake and increase exercise as tolerated. Needs 7-8 hours of sleep nightly. Avoid trans fats, eat small, frequent meals every 4-5 hours with lean proteins, complex carbs and healthy fats. Minimize simple carbs, GMO foods. 

## 2014-09-09 NOTE — Progress Notes (Signed)
Subjective:    Patient ID: David [name], male    DOB: 10-07-1945, 69 y.o.   MRN: [id number]  Chief Complaint  Patient presents with  . Follow-up    HPI Patient is in today for follow up on numerous conditions. Is trying to improve diet,has decreased carbs and increased exercise. No recent illness or acute concerns. Denies CP/palp/SOB/HA/congestion/fevers/GI or GU c/o. Taking meds as prescribed  Past Medical History  Diagnosis Date  . LAFB (left anterior fascicular block)   . Diverticulitis   . Hypertension   . Premature ventricular complex   . Atrial fibrillation   . Hyperlipidemia   . Gout   . Anxiety   . Back pain   . Internal hemorrhoids   . GERD (gastroesophageal reflux disease)   . Diverticulosis   . BCC (basal cell carcinoma of skin)     under right eye and right ear  . Rectal irritation 09/18/2012  . Other malaise and fatigue 03/13/2013  . Tinea corporis 03/13/2013  . Abnormal liver function 03/18/2013  . IBS (irritable bowel syndrome) 03/18/2013  . Unspecified constipation 09/25/2013  . Medicare annual wellness visit, subsequent 03/12/2014    Past Surgical History  Procedure Laterality Date  . Nose surgery    . Colonoscopy    . Colon surgery  2009    segmental sigmoid resection  . Skin biopsy      Family History  Problem Relation Age of Onset  . Heart disease Mother   . Cancer Mother     colon, breast  . Hypertension Mother   . Stroke Mother   . Heart disease Father     pacemaker  . Aortic aneurysm Father   . Hypertension Father   . Heart disease Sister   . Atrial fibrillation Sister   . Obesity Sister   . Sleep apnea Sister   . Heart attack Brother   . Other Brother     muscle disease  . Arthritis Brother   . Stroke Brother   . Cancer Maternal Grandmother     ?  Marland Kitchen Heart attack Maternal Grandmother   . Diabetes Maternal Grandmother   . Cancer Maternal Grandfather     hodgin's lymphoma  . Heart attack Paternal Grandmother   . Anxiety disorder  Paternal Grandmother   . Pneumonia Paternal Grandfather   . Heart attack Brother   . Atrial fibrillation Brother   . Heart attack Brother   . Hypertension Brother   . Hyperlipidemia Brother   . Heart attack Brother   . Other Brother     heart valve operation    Social History   Social History  . Marital Status: Married    Spouse Name: N/A  . Number of Children: 3  . Years of Education: N/A   Occupational History  . [employer]    Social History Main Topics  . Smoking status: Former Smoker -- 1.00 packs/day for 6 years    Types: Cigarettes    Start date: 01/12/1969  . Smokeless tobacco: Never Used  . Alcohol Use: Yes     Comment: 2 drinks every night  . Drug Use: No  . Sexual Activity: Yes   Other Topics Concern  . Not on file   Social History Narrative    Outpatient Prescriptions Prior to Visit  Medication Sig Dispense Refill  . alum hydroxide-mag trisilicate (GAVISCON) 79-02 MG CHEW Chew by mouth.    Marland Kitchen aspirin 81 MG tablet Take 81 mg by mouth daily as needed.     Marland Kitchen  atenolol (TENORMIN) 25 MG tablet Take 1 tablet (25 mg total) by mouth 2 (two) times daily. 60 tablet 3  . atorvastatin (LIPITOR) 40 MG tablet Take 20 mg by mouth 2 (two) times daily.     . clotrimazole-betamethasone (LOTRISONE) lotion Apply topically 2 (two) times daily. For 2 weeks. 30 mL 0  . colchicine (COLCRYS) 0.6 MG tablet TAKE 2 TABLETS BY MOUTH AT FIRST SIGN OF GOUT ATTACK, MAY TAKE 1 TABLET 1 HOUR LATER. REPEAT DAILY UNTIL ATTACK IS RESOLVED. 30 tablet 6  . cyclobenzaprine (FLEXERIL) 5 MG tablet Take 1 tablet (5 mg total) by mouth 3 (three) times daily as needed for muscle spasms. 30 tablet 1  . febuxostat (ULORIC) 40 MG tablet Take 1 tablet (40 mg total) by mouth daily. 30 tablet 6  . fluticasone (FLONASE) 50 MCG/ACT nasal spray Place 2 sprays into the nose daily as needed. For congestion    . losartan (COZAAR) 25 MG tablet Take 1 tablet (25 mg total) by mouth daily. 30 tablet 5  . meloxicam (MOBIC)  15 MG tablet PRN As directed    . neomycin-polymyxin-hydrocortisone (CORTISPORIN) otic solution Place 3 drops into both ears 3 (three) times daily. 10 mL 0  . omeprazole (PRILOSEC) 20 MG capsule TAKE ONE CAPSULE BY MOUTH EVERY DAY 30 capsule 3  . omeprazole (PRILOSEC) 20 MG capsule TAKE ONE CAPSULE BY MOUTH EVERY DAY 30 capsule 3  . omeprazole (PRILOSEC) 20 MG capsule TAKE ONE CAPSULE BY MOUTH EVERY DAY 30 capsule 3  . PROCTOZONE-HC 2.5 % rectal cream Place 1 application rectally as needed.     . sertraline (ZOLOFT) 50 MG tablet TAKE 1 TABLET BY MOUTH EVERY EVENING 90 tablet 1  . triamcinolone (KENALOG) 0.025 % ointment Apply 1 application topically 2 (two) times daily. 30 g 0  . LORazepam (ATIVAN) 1 MG tablet TAKE 1 TABLET BY MOUTH EVERY 8 HOURS AS NEEDED FOR ANXIETY OR SLEEP 90 tablet 0  . zoster vaccine live, PF, (ZOSTAVAX) [id number] UNT/0.65ML injection Inject 19,400 Units into the skin once. 1 each 0  . HYDROcodone-acetaminophen (NORCO/VICODIN) 5-325 MG per tablet Take 1 tablet by mouth every 6 (six) hours as needed. (Patient not taking: Reported on 08/30/2014) 30 tablet 0   No facility-administered medications prior to visit.    Allergies  Allergen Reactions  . Cefaclor Hives  . Cephalosporins Hives  . Penicillins Rash    Mild maculopapular rash    Review of Systems  Constitutional: Negative for fever and malaise/fatigue.  HENT: Negative for congestion.   Eyes: Negative for discharge.  Respiratory: Negative for shortness of breath.   Cardiovascular: Negative for chest pain, palpitations and leg swelling.  Gastrointestinal: Negative for nausea and abdominal pain.  Genitourinary: Negative for dysuria.  Musculoskeletal: Negative for falls.  Skin: Negative for rash.  Neurological: Negative for loss of consciousness and headaches.  Endo/Heme/Allergies: Negative for environmental allergies.  Psychiatric/Behavioral: Negative for depression. The patient is not nervous/anxious.          Objective:    Physical Exam  Constitutional: He is oriented to person, place, and time. He appears well-developed and well-nourished. No distress.  HENT:  Head: Normocephalic and atraumatic.  Nose: Nose normal.  Eyes: Right eye exhibits no discharge. Left eye exhibits no discharge.  Neck: Normal range of motion. Neck supple.  Cardiovascular: Normal rate and regular rhythm.   No murmur heard. Pulmonary/Chest: Effort normal and breath sounds normal.  Abdominal: Soft. Bowel sounds are normal. There is no tenderness.  Musculoskeletal:  He exhibits no edema.  Neurological: He is alert and oriented to person, place, and time.  Skin: Skin is warm and dry.  Psychiatric: He has a normal mood and affect.  Nursing note and vitals reviewed.   BP 120/80 mmHg  Pulse 73  Temp(Src) 98.4 F (36.9 C) (Oral)  Ht 6\' 3"  (1.905 m)  Wt 266 lb (120.657 kg)  BMI 33.25 kg/m2  SpO2 97% Wt Readings from Last 3 Encounters:  08/30/14 266 lb (120.657 kg)  06/04/14 262 lb 6.4 oz (119.024 kg)  04/30/14 258 lb 6 oz (117.198 kg)     Lab Results  Component Value Date   WBC 6.4 08/27/2014   HGB 14.8 08/27/2014   HCT 44.4 08/27/2014   PLT 197.0 08/27/2014   GLUCOSE 112* 08/27/2014   CHOL 170 08/27/2014   TRIG 103.0 08/27/2014   HDL 32.80* 08/27/2014   LDLDIRECT 168.5 03/29/2006   LDLCALC 117* 08/27/2014   ALT 45 08/27/2014   AST 30 08/27/2014   NA 141 08/27/2014   K 4.0 08/27/2014   CL 107 08/27/2014   CREATININE 1.17 08/27/2014   BUN 11 08/27/2014   CO2 27 08/27/2014   TSH 3.55 08/27/2014   PSA 1.47 02/23/2014   INR 1.39 11/14/2010   HGBA1C 5.7* 11/13/2010    Lab Results  Component Value Date   TSH 3.55 08/27/2014   Lab Results  Component Value Date   WBC 6.4 08/27/2014   HGB 14.8 08/27/2014   HCT 44.4 08/27/2014   MCV 94.1 08/27/2014   PLT 197.0 08/27/2014   Lab Results  Component Value Date   NA 141 08/27/2014   K 4.0 08/27/2014   CO2 27 08/27/2014   GLUCOSE 112*  08/27/2014   BUN 11 08/27/2014   CREATININE 1.17 08/27/2014   BILITOT 0.5 08/27/2014   ALKPHOS 74 08/27/2014   AST 30 08/27/2014   ALT 45 08/27/2014   PROT 6.6 08/27/2014   ALBUMIN 3.8 08/27/2014   CALCIUM 9.5 08/27/2014   GFR 65.64 08/27/2014   Lab Results  Component Value Date   CHOL 170 08/27/2014   Lab Results  Component Value Date   HDL 32.80* 08/27/2014   Lab Results  Component Value Date   LDLCALC 117* 08/27/2014   Lab Results  Component Value Date   TRIG 103.0 08/27/2014   Lab Results  Component Value Date   CHOLHDL 5 08/27/2014   Lab Results  Component Value Date   HGBA1C 5.7* 11/13/2010       Assessment & Plan:   Problem List Items Addressed This Visit    Vitamin D deficiency - Primary   Relevant Orders   Comprehensive metabolic panel   Lipid panel   Hemoglobin A1c   TSH   CBC   Uric acid   Vitamin D (25 hydroxy)   Renal insufficiency   Relevant Orders   Comprehensive metabolic panel   Lipid panel   Hemoglobin A1c   TSH   CBC   Uric acid   Vitamin D (25 hydroxy)   Overweight    Encouraged DASH diet, decrease po intake and increase exercise as tolerated. Needs 7-8 hours of sleep nightly. Avoid trans fats, eat small, frequent meals every 4-5 hours with lean proteins, complex carbs and healthy fats. Minimize simple carbs, GMO foods.      Hyperlipidemia, mixed    Tolerating statin, encouraged heart healthy diet, avoid trans fats, minimize simple carbs and saturated fats. Increase exercise as tolerated      Gout  Doing well on Uloric, maintain adequate hydration      Relevant Orders   Comprehensive metabolic panel   Lipid panel   Hemoglobin A1c   TSH   CBC   Uric acid   Vitamin D (25 hydroxy)   Essential hypertension, benign    Well controlled, no changes to meds. Encouraged heart healthy diet such as the DASH diet and exercise as tolerated.       Relevant Orders   Comprehensive metabolic panel   Lipid panel   Hemoglobin A1c    TSH   CBC   Uric acid   Vitamin D (25 hydroxy)   Constipation    Encouraged increased hydration and fiber in diet. Daily probiotics. If bowels not moving can use MOM 2 tbls po in 4 oz of warm prune juice by mouth every 2-3 days. If no results then repeat in 4 hours with  Dulcolax suppository pr, may repeat again in 4 more hours as needed. Seek care if symptoms worsen. Consider daily Miralax and/or Dulcolax if symptoms persist.        Other Visit Diagnoses    Need for 23-polyvalent pneumococcal polysaccharide vaccine        Relevant Orders    Pneumococcal polysaccharide vaccine 23-valent greater than or equal to 2yo subcutaneous/IM (Completed)       I am having Mr. Radu maintain his atorvastatin, fluticasone, meloxicam, alum hydroxide-mag trisilicate, aspirin, PROCTOZONE-HC, triamcinolone, neomycin-polymyxin-hydrocortisone, colchicine, cyclobenzaprine, HYDROcodone-acetaminophen, clotrimazole-betamethasone, febuxostat, sertraline, losartan, atenolol, omeprazole, omeprazole, omeprazole, LORazepam, and zoster vaccine live (PF).  Meds ordered this encounter  Medications  . LORazepam (ATIVAN) 1 MG tablet    Sig: TAKE 1 TABLET BY MOUTH EVERY 8 HOURS AS NEEDED FOR ANXIETY OR SLEEP    Dispense:  90 tablet    Refill:  3  . zoster vaccine live, PF, (ZOSTAVAX) [id number] UNT/0.65ML injection    Sig: Inject 19,400 Units into the skin once.    Dispense:  1 each    Refill:  0     Penni Homans, MD

## 2014-09-09 NOTE — Assessment & Plan Note (Signed)
Well controlled, no changes to meds. Encouraged heart healthy diet such as the DASH diet and exercise as tolerated.  °

## 2014-09-11 ENCOUNTER — Telehealth: Payer: Self-pay | Admitting: Family Medicine

## 2014-09-11 NOTE — Telephone Encounter (Signed)
Wait 30 days? Written on bottom of RX for zoster vaccine live, PF, (ZOSTAVAX) [id number] UNT/0.65ML injection. Pharmacy wants to make sure ok to fill. Please call [phone], [place]

## 2014-09-11 NOTE — Telephone Encounter (Signed)
Called the pharmacist at [facility]	[place] and spoke to Valier.  Informed her this patient had his Pneumonia 23 on 08/30/14 and to wait 30 days to get shingles.

## 2014-09-27 ENCOUNTER — Emergency Department (HOSPITAL_BASED_OUTPATIENT_CLINIC_OR_DEPARTMENT_OTHER): Payer: Medicare Other

## 2014-09-27 ENCOUNTER — Encounter (HOSPITAL_BASED_OUTPATIENT_CLINIC_OR_DEPARTMENT_OTHER): Payer: Self-pay

## 2014-09-27 ENCOUNTER — Emergency Department (HOSPITAL_BASED_OUTPATIENT_CLINIC_OR_DEPARTMENT_OTHER)
Admission: EM | Admit: 2014-09-27 | Discharge: 2014-09-27 | Disposition: A | Discharge status: Home / Self Care | Payer: Medicare Other | Attending: Emergency Medicine | Admitting: Emergency Medicine

## 2014-09-27 DIAGNOSIS — Z85828 Personal history of other malignant neoplasm of skin: Secondary | ICD-10-CM | POA: Insufficient documentation

## 2014-09-27 DIAGNOSIS — F419 Anxiety disorder, unspecified: Secondary | ICD-10-CM | POA: Insufficient documentation

## 2014-09-27 DIAGNOSIS — I10 Essential (primary) hypertension: Secondary | ICD-10-CM | POA: Insufficient documentation

## 2014-09-27 DIAGNOSIS — Z79899 Other long term (current) drug therapy: Secondary | ICD-10-CM | POA: Insufficient documentation

## 2014-09-27 DIAGNOSIS — E785 Hyperlipidemia, unspecified: Secondary | ICD-10-CM | POA: Insufficient documentation

## 2014-09-27 DIAGNOSIS — I4891 Unspecified atrial fibrillation: Secondary | ICD-10-CM | POA: Insufficient documentation

## 2014-09-27 DIAGNOSIS — Z87891 Personal history of nicotine dependence: Secondary | ICD-10-CM | POA: Diagnosis not present

## 2014-09-27 DIAGNOSIS — Z8739 Personal history of other diseases of the musculoskeletal system and connective tissue: Secondary | ICD-10-CM | POA: Diagnosis not present

## 2014-09-27 DIAGNOSIS — Z8619 Personal history of other infectious and parasitic diseases: Secondary | ICD-10-CM | POA: Insufficient documentation

## 2014-09-27 DIAGNOSIS — Z880 Allergy status to penicillin: Secondary | ICD-10-CM | POA: Insufficient documentation

## 2014-09-27 DIAGNOSIS — K219 Gastro-esophageal reflux disease without esophagitis: Secondary | ICD-10-CM | POA: Diagnosis not present

## 2014-09-27 DIAGNOSIS — N281 Cyst of kidney, acquired: Secondary | ICD-10-CM | POA: Diagnosis not present

## 2014-09-27 DIAGNOSIS — R1011 Right upper quadrant pain: Secondary | ICD-10-CM | POA: Diagnosis present

## 2014-09-27 DIAGNOSIS — R197 Diarrhea, unspecified: Secondary | ICD-10-CM | POA: Diagnosis not present

## 2014-09-27 DIAGNOSIS — Z7982 Long term (current) use of aspirin: Secondary | ICD-10-CM | POA: Insufficient documentation

## 2014-09-27 LAB — URINALYSIS, ROUTINE W REFLEX MICROSCOPIC
Bilirubin Urine: NEGATIVE
Glucose, UA: NEGATIVE mg/dL
Hgb urine dipstick: NEGATIVE
Ketones, ur: NEGATIVE mg/dL
Leukocytes, UA: NEGATIVE
Nitrite: NEGATIVE
Protein, ur: NEGATIVE mg/dL
Specific Gravity, Urine: 1.02 (ref 1.005–1.030)
Urobilinogen, UA: 1 mg/dL (ref 0.0–1.0)
pH: 5.5 (ref 5.0–8.0)

## 2014-09-27 LAB — COMPREHENSIVE METABOLIC PANEL WITH GFR
ALT: 49 U/L (ref 17–63)
AST: 39 U/L (ref 15–41)
Albumin: 4 g/dL (ref 3.5–5.0)
Alkaline Phosphatase: 65 U/L (ref 38–126)
Anion gap: 6 (ref 5–15)
BUN: 15 mg/dL (ref 6–20)
CO2: 28 mmol/L (ref 22–32)
Calcium: 9.5 mg/dL (ref 8.9–10.3)
Chloride: 104 mmol/L (ref 101–111)
Creatinine, Ser: 1.38 mg/dL — ABNORMAL HIGH (ref 0.61–1.24)
GFR calc Af Amer: 59 mL/min — ABNORMAL LOW
GFR calc non Af Amer: 51 mL/min — ABNORMAL LOW
Glucose, Bld: 106 mg/dL — ABNORMAL HIGH (ref 65–99)
Potassium: 4.6 mmol/L (ref 3.5–5.1)
Sodium: 138 mmol/L (ref 135–145)
Total Bilirubin: 1.1 mg/dL (ref 0.3–1.2)
Total Protein: 7 g/dL (ref 6.5–8.1)

## 2014-09-27 LAB — CBC WITH DIFFERENTIAL/PLATELET
BASOS ABS: 0 10*3/uL (ref 0.0–0.1)
BASOS PCT: 0 %
Eosinophils Absolute: 0.3 10*3/uL (ref 0.0–0.7)
Eosinophils Relative: 4 %
HEMATOCRIT: 45.8 % (ref 39.0–52.0)
Hemoglobin: 15.3 g/dL (ref 13.0–17.0)
LYMPHS PCT: 21 %
Lymphs Abs: 1.8 10*3/uL (ref 0.7–4.0)
MCH: 31.2 pg (ref 26.0–34.0)
MCHC: 33.4 g/dL (ref 30.0–36.0)
MCV: 93.5 fL (ref 78.0–100.0)
MONO ABS: 0.8 10*3/uL (ref 0.1–1.0)
Monocytes Relative: 10 %
NEUTROS ABS: 5.4 10*3/uL (ref 1.7–7.7)
NEUTROS PCT: 65 %
PLATELETS: 200 10*3/uL (ref 150–400)
RBC: 4.9 MIL/uL (ref 4.22–5.81)
RDW: 13.2 % (ref 11.5–15.5)
WBC: 8.4 10*3/uL (ref 4.0–10.5)

## 2014-09-27 LAB — LIPASE, BLOOD: Lipase: 19 U/L — ABNORMAL LOW (ref 22–51)

## 2014-09-27 MED ORDER — ONDANSETRON HCL 4 MG/2ML IJ SOLN
4.0000 mg | Freq: Once | INTRAMUSCULAR | Status: AC
  Administered 2014-09-27: 4 mg via INTRAVENOUS
  Filled 2014-09-27: qty 2

## 2014-09-27 MED ORDER — MORPHINE SULFATE (PF) 4 MG/ML IV SOLN: 4.0000 mg | Freq: Once | INTRAVENOUS | Status: DC

## 2014-09-27 MED ORDER — MORPHINE SULFATE (PF) 2 MG/ML IV SOLN
2.0000 mg | Freq: Once | INTRAVENOUS | Status: AC
  Administered 2014-09-27: 2 mg via INTRAVENOUS
  Filled 2014-09-27: qty 1

## 2014-09-27 NOTE — ED Notes (Signed)
Pt requesting only nausea meds at this time.

## 2014-09-27 NOTE — Discharge Instructions (Signed)

## 2014-09-27 NOTE — ED Notes (Signed)
RUQ pain since wednesday

## 2014-09-27 NOTE — ED Provider Notes (Signed)
CSN: [id number]     Arrival date & time 09/27/14  1736 History   This chart was scribed for David Fraise, MD by Tula Nakayama, ED Scribe. This patient was seen in room MH04/MH04 and the patient's care was started at 6:34 PM.    Chief Complaint  Patient presents with  . Abdominal Pain   The history is provided by the patient. No language interpreter was used.    HPI Comments: PERRION DIESEL is a 69 y.o. male with a history of colon surgery, diverticulitis, HTN, hyperlipidemia, GERD and IBS who presents to the Emergency Department complaining of constant, mild RUQ pain that started yesterday. He states nausea and diarrhea as associated symptoms. His wife notes that she also had a few episodes of diarrhea yesterday, but her symptoms have resolved.  Pt denies recent injuries or falls. He also denies fever, vomiting, blood in his stool, CP, SOB and numbness of his legs as associated symptoms.  Past Medical History  Diagnosis Date  . LAFB (left anterior fascicular block)   . Diverticulitis   . Hypertension   . Premature ventricular complex   . Atrial fibrillation   . Hyperlipidemia   . Gout   . Anxiety   . Back pain   . Internal hemorrhoids   . GERD (gastroesophageal reflux disease)   . Diverticulosis   . BCC (basal cell carcinoma of skin)     under right eye and right ear  . Rectal irritation 09/18/2012  . Other malaise and fatigue 03/13/2013  . Tinea corporis 03/13/2013  . Abnormal liver function 03/18/2013  . IBS (irritable bowel syndrome) 03/18/2013  . Unspecified constipation 09/25/2013  . Medicare annual wellness visit, subsequent 03/12/2014   Past Surgical History  Procedure Laterality Date  . Nose surgery    . Colonoscopy    . Colon surgery  2009    segmental sigmoid resection  . Skin biopsy     Family History  Problem Relation Age of Onset  . Heart disease Mother   . Cancer Mother     colon, breast  . Hypertension Mother   . Stroke Mother   . Heart disease Father      pacemaker  . Aortic aneurysm Father   . Hypertension Father   . Heart disease Sister   . Atrial fibrillation Sister   . Obesity Sister   . Sleep apnea Sister   . Heart attack Brother   . Other Brother     muscle disease  . Arthritis Brother   . Stroke Brother   . Cancer Maternal Grandmother     ?  Marland Kitchen Heart attack Maternal Grandmother   . Diabetes Maternal Grandmother   . Cancer Maternal Grandfather     hodgin's lymphoma  . Heart attack Paternal Grandmother   . Anxiety disorder Paternal Grandmother   . Pneumonia Paternal Grandfather   . Heart attack Brother   . Atrial fibrillation Brother   . Heart attack Brother   . Hypertension Brother   . Hyperlipidemia Brother   . Heart attack Brother   . Other Brother     heart valve operation   Social History  Substance Use Topics  . Smoking status: Former Smoker -- 1.00 packs/day for 6 years    Types: Cigarettes    Start date: 01/12/1969  . Smokeless tobacco: Never Used  . Alcohol Use: Yes     Comment: 2 drinks every night    Review of Systems  Respiratory: Negative for shortness of breath.  Cardiovascular: Negative for chest pain.  Gastrointestinal: Positive for nausea, abdominal pain and diarrhea. Negative for vomiting and blood in stool.  Neurological: Negative for numbness.  All other systems reviewed and are negative.     Allergies  Cefaclor; Cephalosporins; and Penicillins  Home Medications   Prior to Admission medications   Medication Sig Start Date End Date Taking? Authorizing Provider  alum hydroxide-mag trisilicate (GAVISCON) 71-24 MG CHEW Chew by mouth.    Historical Provider, MD  aspirin 81 MG tablet Take 81 mg by mouth daily as needed.     Historical Provider, MD  atenolol (TENORMIN) 25 MG tablet Take 1 tablet (25 mg total) by mouth 2 (two) times daily. 07/02/14   Mosie Lukes, MD  atorvastatin (LIPITOR) 40 MG tablet Take 20 mg by mouth 2 (two) times daily.     Historical Provider, MD   clotrimazole-betamethasone (LOTRISONE) lotion Apply topically 2 (two) times daily. For 2 weeks. 03/20/14   Brunetta Jeans, PA-C  colchicine (COLCRYS) 0.6 MG tablet TAKE 2 TABLETS BY MOUTH AT FIRST SIGN OF GOUT ATTACK, MAY TAKE 1 TABLET 1 HOUR LATER. REPEAT DAILY UNTIL ATTACK IS RESOLVED. 08/01/13   Tammi Sou, MD  cyclobenzaprine (FLEXERIL) 5 MG tablet Take 1 tablet (5 mg total) by mouth 3 (three) times daily as needed for muscle spasms. 03/02/14   Mosie Lukes, MD  febuxostat (ULORIC) 40 MG tablet Take 1 tablet (40 mg total) by mouth daily. 03/23/14   Mosie Lukes, MD  fluticasone (FLONASE) 50 MCG/ACT nasal spray Place 2 sprays into the nose daily as needed. For congestion    Historical Provider, MD  HYDROcodone-acetaminophen (NORCO/VICODIN) 5-325 MG per tablet Take 1 tablet by mouth every 6 (six) hours as needed. Patient not taking: Reported on 08/30/2014 03/02/14   Mosie Lukes, MD  LORazepam (ATIVAN) 1 MG tablet TAKE 1 TABLET BY MOUTH EVERY 8 HOURS AS NEEDED FOR ANXIETY OR SLEEP 08/30/14   Mosie Lukes, MD  losartan (COZAAR) 25 MG tablet Take 1 tablet (25 mg total) by mouth daily. 04/18/14   Mosie Lukes, MD  meloxicam (MOBIC) 15 MG tablet PRN As directed 02/11/12   Historical Provider, MD  neomycin-polymyxin-hydrocortisone (CORTISPORIN) otic solution Place 3 drops into both ears 3 (three) times daily. 06/12/13   Mosie Lukes, MD  omeprazole (PRILOSEC) 20 MG capsule TAKE ONE CAPSULE BY MOUTH EVERY DAY 08/20/14   Mosie Lukes, MD  omeprazole (PRILOSEC) 20 MG capsule TAKE ONE CAPSULE BY MOUTH EVERY DAY 08/21/14   Mosie Lukes, MD  omeprazole (PRILOSEC) 20 MG capsule TAKE ONE CAPSULE BY MOUTH EVERY DAY 08/22/14   Brunetta Jeans, PA-C  PROCTOZONE-HC 2.5 % rectal cream Place 1 application rectally as needed.  08/29/12   Historical Provider, MD  sertraline (ZOLOFT) 50 MG tablet TAKE 1 TABLET BY MOUTH EVERY EVENING 04/02/14   Mosie Lukes, MD  triamcinolone (KENALOG) 0.025 % ointment Apply 1  application topically 2 (two) times daily. 02/22/13   Irene Pap, NP  zoster vaccine live, PF, (ZOSTAVAX) 58099 UNT/0.65ML injection Inject 19,400 Units into the skin once. 08/30/14   Mosie Lukes, MD   BP 132/78 mmHg  Pulse 66  Temp(Src) 98.1 F (36.7 C) (Oral)  Resp 18  Ht 6\' 1"  (1.854 m)  Wt 264 lb (119.75 kg)  BMI 34.84 kg/m2  SpO2 99% Physical Exam  Nursing note and vitals reviewed.  CONSTITUTIONAL: Well developed/well nourished HEAD: Normocephalic/atraumatic EYES: EOMI/PERRL ENMT: Mucous membranes  moist NECK: supple no meningeal signs SPINE/BACK:entire spine nontender CV: S1/S2 noted, no murmurs/rubs/gallops noted LUNGS: Lungs are clear to auscultation bilaterally, no apparent distress ABDOMEN: soft, mild right mid-to-upper abdominal pain, no rebound or guarding, bowel sounds noted throughout abdomen GU:no cva tenderness NEURO: Pt is awake/alert/appropriate, moves all extremitiesx4.  No facial droop.   EXTREMITIES: pulses normal/equal, full ROM SKIN: warm, color normal PSYCH: no abnormalities of mood noted, alert and oriented to situation   ED Course  Procedures   DIAGNOSTIC STUDIES: Oxygen Saturation is 99% on RA, normal by my interpretation.    COORDINATION OF CARE: 6:38 PM Discussed treatment plan with pt which includes lab work and US Abdomen. Pt agreed to plan. 9:40 PM He has renal cyst but no other acute findings Pt improved He is well appearing Stable for d/c home Doubt other acute causes of abd pain He denies active CP/SOB Discussed need to have [place] for renal cyst in 6 months  Labs Review Labs Reviewed  COMPREHENSIVE METABOLIC PANEL - Abnormal; Notable for the following:    Glucose, Bld 106 (*)    Creatinine, Ser 1.38 (*)    GFR calc non Af Amer 51 (*)    GFR calc Af Amer 59 (*)    All other components within normal limits  LIPASE, BLOOD - Abnormal; Notable for the following:    Lipase 19 (*)    All other components within normal limits  CBC  WITH DIFFERENTIAL/PLATELET  URINALYSIS, ROUTINE W REFLEX MICROSCOPIC (NOT AT [facility])    Imaging Review US Abdomen Complete  09/27/2014   CLINICAL DATA:  Right upper quadrant pain  EXAM: ULTRASOUND ABDOMEN COMPLETE  COMPARISON:  10/16/2004  FINDINGS: Gallbladder: No gallstones. Gallbladder sludge is noted. No wall thickening. Negative Murphy sign.  Common bile duct: Diameter: 3 mm.  Liver: Diffusely increased in echogenicity without focal mass.  IVC: No abnormality visualized.  Pancreas: Visualized portion unremarkable.  Spleen: Size and appearance within normal limits.  Right Kidney: Length: 11.0 cm. No hydronephrosis. 1.3 cm exophytic hypoechoic lesion in the interpolar region. There is hypoechoic signal and a septation but through transmission compatible with complex cystic lesion.  Left Kidney: Length: 11.2 cm. Echogenicity within normal limits. No mass or hydronephrosis visualized.  Abdominal aorta: No aneurysm visualized.  Other findings: None.  IMPRESSION: 1.3 cm complex cystic lesion in the right kidney. There are no obvious solid elements. Followup ultrasound in 6 months is recommended to ensure stability.  No hydronephrosis.  Diffuse hepatic steatosis.   Electronically Signed   By: Marybelle Killings M.D.   On: 09/27/2014 20:19   I have personally reviewed and evaluated these lab results as part of my medical decision-making.   EKG Interpretation   Date/Time:  Thursday September 27 2014 19:00:24 EDT Ventricular Rate:  62 PR Interval:  196 QRS Duration: 98 QT Interval:  414 QTC Calculation: 420 R Axis:   -36 Text Interpretation:  Normal sinus rhythm Left axis deviation Incomplete  right bundle branch block Cannot rule out Anterior infarct , age  undetermined Abnormal ECG No significant change since last tracing  Confirmed by Christy Gentles  MD, [place] ([id number]) on 09/27/2014 7:04:07 PM      MDM   Final diagnoses:  Right upper quadrant pain  Renal cyst    Nursing notes including past medical  history and social history reviewed and considered in documentation Labs/vital reviewed myself and considered during evaluation   I personally performed the services described in this documentation, which was scribed in my  presence. The recorded information has been reviewed and is accurate.      David Fraise, MD 09/27/14 2141

## 2014-09-27 NOTE — ED Notes (Addendum)
Pt c/o of abdominal pain right at the rib cage on the right side. Pt states he has not taken anything for the pain. Pt thought it was a stomach bug because wife had one, but wife is better and pt is not. Pt states diarrhea yesterday. Pt has a hx of diverticulitis.

## 2014-10-02 ENCOUNTER — Encounter: Payer: Self-pay | Admitting: Internal Medicine

## 2014-10-08 ENCOUNTER — Other Ambulatory Visit: Payer: Self-pay | Admitting: Family Medicine

## 2014-10-08 MED ORDER — LOSARTAN POTASSIUM 25 MG PO TABS: 25.0000 mg | ORAL_TABLET | Freq: Every day | ORAL | Status: DC

## 2014-10-19 ENCOUNTER — Other Ambulatory Visit: Payer: Self-pay | Admitting: Family Medicine

## 2014-10-19 MED ORDER — SERTRALINE HCL 50 MG PO TABS: 50.0000 mg | ORAL_TABLET | Freq: Every evening | ORAL | Status: DC

## 2014-11-06 ENCOUNTER — Other Ambulatory Visit: Payer: Self-pay | Admitting: Family Medicine

## 2014-11-06 MED ORDER — ATENOLOL 25 MG PO TABS: 25.0000 mg | ORAL_TABLET | Freq: Two times a day (BID) | ORAL | Status: DC

## 2014-11-21 ENCOUNTER — Encounter: Payer: Self-pay | Admitting: Family

## 2014-11-21 ENCOUNTER — Ambulatory Visit (INDEPENDENT_AMBULATORY_CARE_PROVIDER_SITE_OTHER): Payer: Medicare Other | Admitting: Family

## 2014-11-21 VITALS — BP 144/76 | HR 69 | Temp 97.7°F | Resp 18 | Ht 75.0 in | Wt 269.6 lb

## 2014-11-21 DIAGNOSIS — J020 Streptococcal pharyngitis: Secondary | ICD-10-CM

## 2014-11-21 DIAGNOSIS — J029 Acute pharyngitis, unspecified: Secondary | ICD-10-CM

## 2014-11-21 LAB — POCT RAPID STREP A (OFFICE): Rapid Strep A Screen: POSITIVE — AB

## 2014-11-21 MED ORDER — AZITHROMYCIN 250 MG PO TABS: ORAL_TABLET | ORAL | Status: DC

## 2014-11-21 MED ORDER — BENZONATATE 100 MG PO CAPS: 100.0000 mg | ORAL_CAPSULE | Freq: Three times a day (TID) | ORAL | Status: DC | PRN

## 2014-11-21 NOTE — Progress Notes (Signed)
Pre visit review using our clinic review tool, if applicable. No additional management support is needed unless otherwise documented below in the visit note. 

## 2014-11-21 NOTE — Patient Instructions (Signed)
Start zithromax for strep throat. You may use tylenol or motrin as needed for pain. You may use tessalon as needed for cough. Call if symptoms worsen or if symptoms are not improved in 2-3 days.

## 2014-11-21 NOTE — Progress Notes (Signed)
Subjective:    Patient ID: David [name], male    DOB: [birth year], 68 y.o.   MRN: [id number]  HPI  David [name] is a 69 yr old male who presents today with chief complaint of cough.  Cough began 1 week ago.  Cough is worsening and is associated with bilateral ear pain.  He reports + sore throat.   Reports mild fever but has not been checking his temperature.  + body aches. Wife has pneumonia.     Review of Systems    see HPI  Past Medical History  Diagnosis Date  . LAFB (left anterior fascicular block)   . Diverticulitis   . Hypertension   . Premature ventricular complex   . Atrial fibrillation   . Hyperlipidemia   . Gout   . Anxiety   . Back pain   . Internal hemorrhoids   . GERD (gastroesophageal reflux disease)   . Diverticulosis   . BCC (basal cell carcinoma of skin)     under right eye and right ear  . Rectal irritation 09/18/2012  . Other malaise and fatigue 03/13/2013  . Tinea corporis 03/13/2013  . Abnormal liver function 03/18/2013  . IBS (irritable bowel syndrome) 03/18/2013  . Unspecified constipation 09/25/2013  . Medicare annual wellness visit, subsequent 03/12/2014    Social History   Social History  . Marital Status: Married    Spouse Name: N/A  . Number of Children: 3  . Years of Education: N/A   Occupational History  . [employer]    Social History Main Topics  . Smoking status: Former Smoker -- 1.00 packs/day for 6 years    Types: Cigarettes    Start date: 01/12/1969  . Smokeless tobacco: Never Used  . Alcohol Use: Yes     Comment: 2 drinks every night  . Drug Use: No  . Sexual Activity: Yes   Other Topics Concern  . Not on file   Social History Narrative    Past Surgical History  Procedure Laterality Date  . Nose surgery    . Colonoscopy    . Colon surgery  2009    segmental sigmoid resection  . Skin biopsy      Family History  Problem Relation Age of Onset  . Heart disease Mother   . Cancer Mother     colon, breast  . Hypertension  Mother   . Stroke Mother   . Heart disease Father     pacemaker  . Aortic aneurysm Father   . Hypertension Father   . Heart disease Sister   . Atrial fibrillation Sister   . Obesity Sister   . Sleep apnea Sister   . Heart attack Brother   . Other Brother     muscle disease  . Arthritis Brother   . Stroke Brother   . Cancer Maternal Grandmother     ?  Marland Kitchen Heart attack Maternal Grandmother   . Diabetes Maternal Grandmother   . Cancer Maternal Grandfather     hodgin's lymphoma  . Heart attack Paternal Grandmother   . Anxiety disorder Paternal Grandmother   . Pneumonia Paternal Grandfather   . Heart attack Brother   . Atrial fibrillation Brother   . Heart attack Brother   . Hypertension Brother   . Hyperlipidemia Brother   . Heart attack Brother   . Other Brother     heart valve operation    Allergies  Allergen Reactions  . Cefaclor Hives  . Cephalosporins Hives  .  Penicillins Rash    Mild maculopapular rash    Current Outpatient Prescriptions on File Prior to Visit  Medication Sig Dispense Refill  . alum hydroxide-mag trisilicate (GAVISCON) 63-14 MG CHEW Chew by mouth.    Marland Kitchen aspirin 81 MG tablet Take 81 mg by mouth daily as needed.     Marland Kitchen atenolol (TENORMIN) 25 MG tablet Take 1 tablet (25 mg total) by mouth 2 (two) times daily. 60 tablet 3  . atorvastatin (LIPITOR) 40 MG tablet Take 20 mg by mouth 2 (two) times daily.     . cyclobenzaprine (FLEXERIL) 5 MG tablet Take 1 tablet (5 mg total) by mouth 3 (three) times daily as needed for muscle spasms. 30 tablet 1  . fluticasone (FLONASE) 50 MCG/ACT nasal spray Place 2 sprays into the nose daily as needed. For congestion    . LORazepam (ATIVAN) 1 MG tablet TAKE 1 TABLET BY MOUTH EVERY 8 HOURS AS NEEDED FOR ANXIETY OR SLEEP 90 tablet 3  . losartan (COZAAR) 25 MG tablet Take 1 tablet (25 mg total) by mouth daily. 30 tablet 5  . omeprazole (PRILOSEC) 20 MG capsule TAKE ONE CAPSULE BY MOUTH EVERY DAY 30 capsule 3  . PROCTOZONE-HC  2.5 % rectal cream Place 1 application rectally as needed.     . sertraline (ZOLOFT) 50 MG tablet Take 1 tablet (50 mg total) by mouth every evening. (Patient taking differently: Take 25 mg by mouth 2 (two) times daily. ) 90 tablet 1  . meloxicam (MOBIC) 15 MG tablet PRN As directed    . omeprazole (PRILOSEC) 20 MG capsule TAKE ONE CAPSULE BY MOUTH EVERY DAY 30 capsule 3  . omeprazole (PRILOSEC) 20 MG capsule TAKE ONE CAPSULE BY MOUTH EVERY DAY 30 capsule 3   No current facility-administered medications on file prior to visit.    Ht 6\' 3"  (1.905 m)  Wt 269 lb 9.6 oz (122.29 kg)  BMI 33.70 kg/m2    Objective:   Physical Exam  Constitutional: He is oriented to person, place, and time. He appears well-developed and well-nourished. No distress.  HENT:  Head: Normocephalic and atraumatic.  Right Ear: Tympanic membrane and ear canal normal.  Left Ear: Tympanic membrane and ear canal normal.  Mouth/Throat: Posterior oropharyngeal erythema present. No oropharyngeal exudate or posterior oropharyngeal edema.  Cardiovascular: Normal rate and regular rhythm.   No murmur heard. Pulmonary/Chest: Effort normal and breath sounds normal. No respiratory distress. He has no wheezes. He has no rales.  Musculoskeletal: He exhibits no edema.  Lymphadenopathy:    He has no cervical adenopathy.  Neurological: He is alert and oriented to person, place, and time.  Skin: Skin is warm and dry.  Psychiatric: He has a normal mood and affect. His behavior is normal. Thought content normal.          Assessment & Plan:  Strep pharyngitis-  Pen and cephalosporin allergic. Rx with zithromax.  Tessalon prn cough.

## 2014-12-11 ENCOUNTER — Telehealth: Payer: Self-pay | Admitting: Family Medicine

## 2014-12-11 NOTE — Telephone Encounter (Signed)
Called patient to schedule Flu Shot on 11/29. Patient declined and stated that he may call back to schedule.

## 2014-12-20 ENCOUNTER — Encounter: Payer: Self-pay | Admitting: Cardiology

## 2015-03-06 ENCOUNTER — Telehealth: Payer: Self-pay | Admitting: Behavioral Health

## 2015-03-06 ENCOUNTER — Other Ambulatory Visit: Payer: Self-pay | Admitting: Family Medicine

## 2015-03-06 NOTE — Telephone Encounter (Signed)
Attempted to reach patient for Pre-Visit Call. Unable to leave a message; the patient's voice mailbox has not been setup at this time.

## 2015-03-07 ENCOUNTER — Encounter: Payer: Self-pay | Admitting: Family Medicine

## 2015-03-07 ENCOUNTER — Ambulatory Visit (INDEPENDENT_AMBULATORY_CARE_PROVIDER_SITE_OTHER): Payer: Medicare Other | Admitting: Family Medicine

## 2015-03-07 VITALS — BP 148/88 | HR 69 | Temp 98.3°F | Ht 72.0 in | Wt 279.2 lb

## 2015-03-07 DIAGNOSIS — K760 Fatty (change of) liver, not elsewhere classified: Secondary | ICD-10-CM

## 2015-03-07 DIAGNOSIS — F418 Other specified anxiety disorders: Secondary | ICD-10-CM | POA: Diagnosis not present

## 2015-03-07 DIAGNOSIS — K219 Gastro-esophageal reflux disease without esophagitis: Secondary | ICD-10-CM | POA: Diagnosis not present

## 2015-03-07 DIAGNOSIS — E663 Overweight: Secondary | ICD-10-CM

## 2015-03-07 DIAGNOSIS — N289 Disorder of kidney and ureter, unspecified: Secondary | ICD-10-CM

## 2015-03-07 DIAGNOSIS — E559 Vitamin D deficiency, unspecified: Secondary | ICD-10-CM

## 2015-03-07 DIAGNOSIS — Z0000 Encounter for general adult medical examination without abnormal findings: Secondary | ICD-10-CM

## 2015-03-07 DIAGNOSIS — I4821 Permanent atrial fibrillation: Secondary | ICD-10-CM

## 2015-03-07 DIAGNOSIS — Z23 Encounter for immunization: Secondary | ICD-10-CM

## 2015-03-07 DIAGNOSIS — I482 Chronic atrial fibrillation: Secondary | ICD-10-CM

## 2015-03-07 DIAGNOSIS — E782 Mixed hyperlipidemia: Secondary | ICD-10-CM | POA: Diagnosis not present

## 2015-03-07 DIAGNOSIS — M109 Gout, unspecified: Secondary | ICD-10-CM | POA: Diagnosis not present

## 2015-03-07 DIAGNOSIS — I10 Essential (primary) hypertension: Secondary | ICD-10-CM | POA: Diagnosis not present

## 2015-03-07 LAB — CBC
HCT: 44 % (ref 39.0–52.0)
Hemoglobin: 14.9 g/dL (ref 13.0–17.0)
MCHC: 33.8 g/dL (ref 30.0–36.0)
MCV: 92.9 fl (ref 78.0–100.0)
PLATELETS: 212 10*3/uL (ref 150.0–400.0)
RBC: 4.74 Mil/uL (ref 4.22–5.81)
RDW: 14.2 % (ref 11.5–15.5)
WBC: 7.1 10*3/uL (ref 4.0–10.5)

## 2015-03-07 LAB — LIPID PANEL
CHOL/HDL RATIO: 5
CHOLESTEROL: 208 mg/dL — AB (ref 0–200)
HDL: 38.4 mg/dL — ABNORMAL LOW (ref >39.00)
LDL CALC: 147 mg/dL — AB (ref 0–99)
NONHDL: 169.18
Triglycerides: 110 mg/dL (ref 0.0–149.0)
VLDL: 22 mg/dL (ref 0.0–40.0)

## 2015-03-07 LAB — VITAMIN D 25 HYDROXY (VIT D DEFICIENCY, FRACTURES): VITD: 49.33 ng/mL (ref 30.00–100.00)

## 2015-03-07 LAB — COMPREHENSIVE METABOLIC PANEL
ALT: 48 U/L (ref 0–53)
AST: 34 U/L (ref 0–37)
Albumin: 4 g/dL (ref 3.5–5.2)
Alkaline Phosphatase: 63 U/L (ref 39–117)
BUN: 17 mg/dL (ref 6–23)
CHLORIDE: 104 meq/L (ref 96–112)
CO2: 30 meq/L (ref 19–32)
CREATININE: 1.36 mg/dL (ref 0.40–1.50)
Calcium: 9.9 mg/dL (ref 8.4–10.5)
GFR: 55.09 mL/min — ABNORMAL LOW (ref >60.00)
GLUCOSE: 104 mg/dL — AB (ref 70–99)
POTASSIUM: 4.1 meq/L (ref 3.5–5.1)
SODIUM: 139 meq/L (ref 135–145)
Total Bilirubin: 0.7 mg/dL (ref 0.2–1.2)
Total Protein: 7 g/dL (ref 6.0–8.3)

## 2015-03-07 LAB — URIC ACID: Uric Acid, Serum: 7.6 mg/dL (ref 4.0–7.8)

## 2015-03-07 LAB — TSH: TSH: 4.56 u[IU]/mL — ABNORMAL HIGH (ref 0.35–4.50)

## 2015-03-07 MED ORDER — ZOSTER VACCINE LIVE 19400 UNT/0.65ML ~~LOC~~ SOLR: 0.6500 mL | Freq: Once | SUBCUTANEOUS | Status: DC

## 2015-03-07 MED ORDER — SERTRALINE HCL 50 MG PO TABS: 50.0000 mg | ORAL_TABLET | Freq: Two times a day (BID) | ORAL | Status: DC

## 2015-03-07 MED ORDER — LORAZEPAM 1 MG PO TABS
ORAL_TABLET | ORAL | Status: DC
Start: 2015-03-07 — End: 2015-11-04

## 2015-03-07 NOTE — Assessment & Plan Note (Signed)
Check Vitamin D level today.  

## 2015-03-07 NOTE — Assessment & Plan Note (Signed)
Patient encouraged to maintain heart healthy diet, regular exercise, adequate sleep. Consider daily probiotics. Take medications as prescribed 

## 2015-03-07 NOTE — Progress Notes (Signed)
Pre visit review using our clinic review tool, if applicable. No additional management support is needed unless otherwise documented below in the visit note. 

## 2015-03-07 NOTE — Progress Notes (Signed)
Patient ID: David [name], male   DOB: January 01, 1946, 70 y.o.   MRN: [id number]   Subjective:    Patient ID: David [name], male    DOB: 1945/02/13, 70 y.o.   MRN: [id number]  Chief Complaint  Patient presents with  . Annual Exam    HPI Patient is in today for Annual exam and follow-up. He is struggling with depression and anxiety. He endorses anhedonia difficulty concentrating and fatigue but he denies suicidal ideation. Has been under a great deal of stress lately. Also notes some intermittent heartburn. No other acute illness or acute concerns noted. Denies CP/palp/SOB/HA/congestion/fevers/GI or GU c/o. Taking meds as prescribed  Past Medical History  Diagnosis Date  . LAFB (left anterior fascicular block)   . Diverticulitis   . Hypertension   . Premature ventricular complex   . Atrial fibrillation ([place])   . Hyperlipidemia   . Gout   . Anxiety   . Back pain   . Internal hemorrhoids   . GERD (gastroesophageal reflux disease)   . Diverticulosis   . BCC (basal cell carcinoma of skin)     under right eye and right ear  . Rectal irritation 09/18/2012  . Other malaise and fatigue 03/13/2013  . Tinea corporis 03/13/2013  . Abnormal liver function 03/18/2013  . IBS (irritable bowel syndrome) 03/18/2013  . Unspecified constipation 09/25/2013  . Medicare annual wellness visit, subsequent 03/12/2014  . Preventative health care 03/07/2015    Past Surgical History  Procedure Laterality Date  . Nose surgery    . Colonoscopy    . Colon surgery  2009    segmental sigmoid resection  . Skin biopsy      Family History  Problem Relation Age of Onset  . Heart disease Mother   . Cancer Mother     colon, breast  . Hypertension Mother   . Stroke Mother   . Heart disease Father     pacemaker  . Aortic aneurysm Father   . Hypertension Father   . Heart disease Sister   . Atrial fibrillation Sister   . Obesity Sister   . Sleep apnea Sister   . Heart attack Brother   . Other Brother    muscle disease  . Arthritis Brother   . Stroke Brother   . Cancer Maternal Grandmother     ?  Marland Kitchen Heart attack Maternal Grandmother   . Diabetes Maternal Grandmother   . Cancer Maternal Grandfather     hodgin's lymphoma  . Heart attack Paternal Grandmother   . Anxiety disorder Paternal Grandmother   . Pneumonia Paternal Grandfather   . Heart attack Brother   . Atrial fibrillation Brother   . Heart attack Brother   . Hypertension Brother   . Hyperlipidemia Brother   . Heart attack Brother   . Other Brother     heart valve operation    Social History   Social History  . Marital Status: Married    Spouse Name: N/A  . Number of Children: 3  . Years of Education: N/A   Occupational History  . [employer]    Social History Main Topics  . Smoking status: Former Smoker -- 1.00 packs/day for 6 years    Types: Cigarettes    Start date: 01/12/1969  . Smokeless tobacco: Never Used  . Alcohol Use: Yes     Comment: 2 drinks every night  . Drug Use: No  . Sexual Activity: Yes   Other Topics Concern  . Not on  file   Social History Narrative    Outpatient Prescriptions Prior to Visit  Medication Sig Dispense Refill  . alum hydroxide-mag trisilicate (GAVISCON) [id number] MG CHEW Chew by mouth.    Marland Kitchen aspirin 81 MG tablet Take 81 mg by mouth daily as needed.     Marland Kitchen atenolol (TENORMIN) 25 MG tablet TAKE ONE TABLET (25 MG) BY MOUTH TWO TIMES DAILY. 60 tablet 3  . atorvastatin (LIPITOR) 40 MG tablet Take 20 mg by mouth 2 (two) times daily.     . cyclobenzaprine (FLEXERIL) 5 MG tablet Take 1 tablet (5 mg total) by mouth 3 (three) times daily as needed for muscle spasms. 30 tablet 1  . fluticasone (FLONASE) 50 MCG/ACT nasal spray Place 2 sprays into the nose daily as needed. For congestion    . losartan (COZAAR) 25 MG tablet Take 1 tablet (25 mg total) by mouth daily. 30 tablet 5  . meloxicam (MOBIC) 15 MG tablet PRN As directed    . omeprazole (PRILOSEC) 20 MG capsule TAKE ONE CAPSULE BY MOUTH  EVERY DAY 30 capsule 3  . omeprazole (PRILOSEC) 20 MG capsule TAKE ONE CAPSULE BY MOUTH EVERY DAY 30 capsule 3  . PROCTOZONE-HC 2.5 % rectal cream Place 1 application rectally as needed.     Marland Kitchen azithromycin (ZITHROMAX) 250 MG tablet 2 tabs by mouth today, then one tab by mouth once daily for 4 more days. 6 tablet 0  . benzonatate (TESSALON) 100 MG capsule Take 1 capsule (100 mg total) by mouth 3 (three) times daily as needed. 20 capsule 0  . LORazepam (ATIVAN) 1 MG tablet TAKE 1 TABLET BY MOUTH EVERY 8 HOURS AS NEEDED FOR ANXIETY OR SLEEP 90 tablet 3  . omeprazole (PRILOSEC) 20 MG capsule TAKE ONE CAPSULE BY MOUTH EVERY DAY 30 capsule 3  . sertraline (ZOLOFT) 50 MG tablet Take 1 tablet (50 mg total) by mouth every evening. (Patient taking differently: Take 25 mg by mouth 2 (two) times daily. ) 90 tablet 1   No facility-administered medications prior to visit.    Allergies  Allergen Reactions  . Cefaclor Hives  . Cephalosporins Hives  . Penicillins Rash    Mild maculopapular rash    Review of Systems  Constitutional: Negative for fever, chills and malaise/fatigue.  HENT: Negative for congestion and hearing loss.   Eyes: Negative for discharge.  Respiratory: Negative for cough, sputum production and shortness of breath.   Cardiovascular: Negative for chest pain, palpitations and leg swelling.  Gastrointestinal: Positive for heartburn. Negative for nausea, vomiting, abdominal pain, diarrhea, constipation and blood in stool.  Genitourinary: Negative for dysuria, urgency, frequency and hematuria.  Musculoskeletal: Negative for myalgias, back pain and falls.  Skin: Negative for rash.  Neurological: Negative for dizziness, sensory change, loss of consciousness, weakness and headaches.  Endo/Heme/Allergies: Negative for environmental allergies. Does not bruise/bleed easily.  Psychiatric/Behavioral: Positive for depression. Negative for suicidal ideas. The patient is not nervous/anxious and  does not have insomnia.        Objective:    Physical Exam  Constitutional: He is oriented to person, place, and time. He appears well-developed and well-nourished. No distress.  HENT:  Head: Normocephalic and atraumatic.  Eyes: Conjunctivae are normal.  Neck: Neck supple. No thyromegaly present.  Cardiovascular: Normal rate, regular rhythm and normal heart sounds.   No murmur heard. Pulmonary/Chest: Effort normal and breath sounds normal. No respiratory distress. He has no wheezes.  Abdominal: Soft. Bowel sounds are normal. He exhibits no mass. There is  no tenderness.  Musculoskeletal: He exhibits no edema.  Lymphadenopathy:    He has no cervical adenopathy.  Neurological: He is alert and oriented to person, place, and time.  Skin: Skin is warm and dry.  Psychiatric: He has a normal mood and affect. His behavior is normal.    BP 148/88 mmHg  Pulse 69  Temp(Src) 98.3 F (36.8 C) (Oral)  Ht 6' (1.829 m)  Wt 279 lb 4 oz (126.667 kg)  BMI 37.86 kg/m2  SpO2 94% Wt Readings from Last 3 Encounters:  03/07/15 279 lb 4 oz (126.667 kg)  11/21/14 269 lb 9.6 oz (122.29 kg)  09/27/14 264 lb (119.75 kg)     Lab Results  Component Value Date   WBC 7.1 03/07/2015   HGB 14.9 03/07/2015   HCT 44.0 03/07/2015   PLT 212.0 03/07/2015   GLUCOSE 104* 03/07/2015   CHOL 208* 03/07/2015   TRIG 110.0 03/07/2015   HDL 38.40* 03/07/2015   LDLDIRECT 168.5 03/29/2006   LDLCALC 147* 03/07/2015   ALT 48 03/07/2015   AST 34 03/07/2015   NA 139 03/07/2015   K 4.1 03/07/2015   CL 104 03/07/2015   CREATININE 1.36 03/07/2015   BUN 17 03/07/2015   CO2 30 03/07/2015   TSH 4.56* 03/07/2015   PSA 1.47 02/23/2014   INR 1.39 11/14/2010   HGBA1C 5.7* 11/13/2010    Lab Results  Component Value Date   TSH 4.56* 03/07/2015   Lab Results  Component Value Date   WBC 7.1 03/07/2015   HGB 14.9 03/07/2015   HCT 44.0 03/07/2015   MCV 92.9 03/07/2015   PLT 212.0 03/07/2015   Lab Results    Component Value Date   NA 139 03/07/2015   K 4.1 03/07/2015   CO2 30 03/07/2015   GLUCOSE 104* 03/07/2015   BUN 17 03/07/2015   CREATININE 1.36 03/07/2015   BILITOT 0.7 03/07/2015   ALKPHOS 63 03/07/2015   AST 34 03/07/2015   ALT 48 03/07/2015   PROT 7.0 03/07/2015   ALBUMIN 4.0 03/07/2015   CALCIUM 9.9 03/07/2015   ANIONGAP 6 09/27/2014   GFR 55.09* 03/07/2015   Lab Results  Component Value Date   CHOL 208* 03/07/2015   Lab Results  Component Value Date   HDL 38.40* 03/07/2015   Lab Results  Component Value Date   LDLCALC 147* 03/07/2015   Lab Results  Component Value Date   TRIG 110.0 03/07/2015   Lab Results  Component Value Date   CHOLHDL 5 03/07/2015   Lab Results  Component Value Date   HGBA1C 5.7* 11/13/2010       Assessment & Plan:   Problem List Items Addressed This Visit    Atrial fibrillation (HCC)   Relevant Medications   LORazepam (ATIVAN) 1 MG tablet   Other Relevant Orders   TSH (Completed)   CBC (Completed)   Comprehensive metabolic panel (Completed)   Lipid panel (Completed)   VITAMIN D 25 Hydroxy (Vit-D Deficiency, Fractures) (Completed)   Depression with anxiety    Sertraline 50 mg daily and referred to behavioral health for counseling.       Relevant Orders   Ambulatory referral to Behavioral Health   Essential hypertension, benign    Well controlled, no changes to meds. Encouraged heart healthy diet such as the DASH diet and exercise as tolerated.       Fatty liver disease, nonalcoholic   Relevant Medications   LORazepam (ATIVAN) 1 MG tablet   Other Relevant Orders  TSH (Completed)   CBC (Completed)   Comprehensive metabolic panel (Completed)   Lipid panel (Completed)   VITAMIN D 25 Hydroxy (Vit-D Deficiency, Fractures) (Completed)   GERD    Avoid offending foods, takeprobiotics. Do not eat large meals in late evening and consider raising head of bed.       Gout   Relevant Medications   LORazepam (ATIVAN) 1 MG  tablet   Other Relevant Orders   TSH (Completed)   CBC (Completed)   Comprehensive metabolic panel (Completed)   Lipid panel (Completed)   VITAMIN D 25 Hydroxy (Vit-D Deficiency, Fractures) (Completed)   Uric acid (Completed)   Hyperlipidemia, mixed    Tolerating statin, encouraged heart healthy diet, avoid trans fats, minimize simple carbs and saturated fats. Increase exercise as tolerated      Relevant Medications   LORazepam (ATIVAN) 1 MG tablet   Other Relevant Orders   TSH (Completed)   CBC (Completed)   Comprehensive metabolic panel (Completed)   Lipid panel (Completed)   VITAMIN D 25 Hydroxy (Vit-D Deficiency, Fractures) (Completed)   Medicare annual wellness visit, subsequent    Patient denies any difficulties at home. No trouble with ADLs, depression or falls. See EMR for functional status screen and depression screen. No recent changes to vision or hearing. Is UTD with immunizations. Is UTD with screening. Discussed Advanced Directives. Encouraged heart healthy diet, exercise as tolerated and adequate sleep. See patient's problem list for health risk factors to monitor. See AVS for preventative healthcare recommendation schedule. Labs reviewed Follows with gastroenterology, Dr Carlean Purl, due for colonoscopy Given rx for Zostavax to get at pharmacy Flu shot today folows with Dr Percival Spanish cardiology Follows with Dr Pierre Bali of opthamology      Overweight    Encouraged DASH diet, decrease po intake and increase exercise as tolerated. Needs 7-8 hours of sleep nightly. Avoid trans fats, eat small, frequent meals every 4-5 hours with lean proteins, complex carbs and healthy fats. Minimize simple carbs, GMO foods.      Preventative health care    Patient encouraged to maintain heart healthy diet, regular exercise, adequate sleep. Consider daily probiotics. Take medications as prescribed.      Renal insufficiency   Relevant Medications   LORazepam (ATIVAN) 1 MG tablet   Other  Relevant Orders   TSH (Completed)   CBC (Completed)   Comprehensive metabolic panel (Completed)   Lipid panel (Completed)   VITAMIN D 25 Hydroxy (Vit-D Deficiency, Fractures) (Completed)   Vitamin D deficiency - Primary    Check Vitamin D level today      Relevant Medications   LORazepam (ATIVAN) 1 MG tablet   Other Relevant Orders   TSH (Completed)   CBC (Completed)   Comprehensive metabolic panel (Completed)   Lipid panel (Completed)   VITAMIN D 25 Hydroxy (Vit-D Deficiency, Fractures) (Completed)    Other Visit Diagnoses    Encounter for immunization        Relevant Orders    Flu Vaccine QUAD 36+ mos IM    Need for viral immunization        Relevant Medications    zoster vaccine live, PF, (ZOSTAVAX) 16109 UNT/0.65ML injection       I have discontinued Mr. Johnson azithromycin and benzonatate. I have also changed his sertraline. Additionally, I am having him start on zoster vaccine live (PF). Lastly, I am having him maintain his atorvastatin, fluticasone, meloxicam, alum hydroxide-mag trisilicate, aspirin, PROCTOZONE-HC, cyclobenzaprine, omeprazole, omeprazole, losartan, atenolol, and LORazepam.  Meds ordered this  encounter  Medications  . LORazepam (ATIVAN) 1 MG tablet    Sig: TAKE 1 TABLET BY MOUTH EVERY 8 HOURS AS NEEDED FOR ANXIETY OR SLEEP    Dispense:  90 tablet    Refill:  3  . zoster vaccine live, PF, (ZOSTAVAX) 13086 UNT/0.65ML injection    Sig: Inject 19,400 Units into the skin once.    Dispense:  1 each    Refill:  0  . sertraline (ZOLOFT) 50 MG tablet    Sig: Take 1 tablet (50 mg total) by mouth 2 (two) times daily.    Dispense:  60 tablet    Refill:  5     Penni Homans, MD

## 2015-03-07 NOTE — Assessment & Plan Note (Signed)
Well controlled, no changes to meds. Encouraged heart healthy diet such as the DASH diet and exercise as tolerated.  °

## 2015-03-07 NOTE — Patient Instructions (Addendum)
[employer] has a 10 strain probiotic cap 1 cap daily   Preventive Care for Adults, Male A healthy lifestyle and preventive care can promote health and wellness. Preventive health guidelines for men include the following key practices:  A routine yearly physical is a good way to check with your health care provider about your health and preventative screening. It is a chance to share any concerns and updates on your health and to receive a thorough exam.  Visit your dentist for a routine exam and preventative care every 6 months. Brush your teeth twice a day and floss once a day. Good oral hygiene prevents tooth decay and gum disease.  The frequency of eye exams is based on your age, health, family medical history, use of contact lenses, and other factors. Follow your health care provider's recommendations for frequency of eye exams.  Eat a healthy diet. Foods such as vegetables, fruits, whole grains, low-fat dairy products, and lean protein foods contain the nutrients you need without too many calories. Decrease your intake of foods high in solid fats, added sugars, and salt. Eat the right amount of calories for you.Get information about a proper diet from your health care provider, if necessary.  Regular physical exercise is one of the most important things you can do for your health. Most adults should get at least 150 minutes of moderate-intensity exercise (any activity that increases your heart rate and causes you to sweat) each week. In addition, most adults need muscle-strengthening exercises on 2 or more days a week.  Maintain a healthy weight. The body mass index (BMI) is a screening tool to identify possible weight problems. It provides an estimate of body fat based on height and weight. Your health care provider can find your BMI and can help you achieve or maintain a healthy weight.For adults 20 years and older:  A BMI below 18.5 is considered underweight.  A BMI of 18.5 to 24.9 is  normal.  A BMI of 25 to 29.9 is considered overweight.  A BMI of 30 and above is considered obese.  Maintain normal blood lipids and cholesterol levels by exercising and minimizing your intake of saturated fat. Eat a balanced diet with plenty of fruit and vegetables. Blood tests for lipids and cholesterol should begin at age 73 and be repeated every 5 years. If your lipid or cholesterol levels are high, you are over 50, or you are at high risk for heart disease, you may need your cholesterol levels checked more frequently.Ongoing high lipid and cholesterol levels should be treated with medicines if diet and exercise are not working.  If you smoke, find out from your health care provider how to quit. If you do not use tobacco, do not start.  Lung cancer screening is recommended for adults aged 22-80 years who are at high risk for developing lung cancer because of a history of smoking. A yearly low-dose CT scan of the lungs is recommended for people who have at least a 30-pack-year history of smoking and are a current smoker or have quit within the past 15 years. A pack year of smoking is smoking an average of 1 pack of cigarettes a day for 1 year (for example: 1 pack a day for 30 years or 2 packs a day for 15 years). Yearly screening should continue until the smoker has stopped smoking for at least 15 years. Yearly screening should be stopped for people who develop a health problem that would prevent them from having lung  cancer treatment.  If you choose to drink alcohol, do not have more than 2 drinks per day. One drink is considered to be 12 ounces (355 mL) of beer, 5 ounces (148 mL) of wine, or 1.5 ounces (44 mL) of liquor.  Avoid use of street drugs. Do not share needles with anyone. Ask for help if you need support or instructions about stopping the use of drugs.  High blood pressure causes heart disease and increases the risk of stroke. Your blood pressure should be checked at least every 1-2  years. Ongoing high blood pressure should be treated with medicines, if weight loss and exercise are not effective.  If you are 72-40 years old, ask your health care provider if you should take aspirin to prevent heart disease.  Diabetes screening is done by taking a blood sample to check your blood glucose level after you have not eaten for a certain period of time (fasting). If you are not overweight and you do not have risk factors for diabetes, you should be screened once every 3 years starting at age 27. If you are overweight or obese and you are 13-19 years of age, you should be screened for diabetes every year as part of your cardiovascular risk assessment.  Colorectal cancer can be detected and often prevented. Most routine colorectal cancer screening begins at the age of 60 and continues through age 7. However, your health care provider may recommend screening at an earlier age if you have risk factors for colon cancer. On a yearly basis, your health care provider may provide home test kits to check for hidden blood in the stool. Use of a small camera at the end of a tube to directly examine the colon (sigmoidoscopy or colonoscopy) can detect the earliest forms of colorectal cancer. Talk to your health care provider about this at age 58, when routine screening begins. Direct exam of the colon should be repeated every 5-10 years through age 74, unless early forms of precancerous polyps or small growths are found.  People who are at an increased risk for hepatitis B should be screened for this virus. You are considered at high risk for hepatitis B if:  You were born in a country where hepatitis B occurs often. Talk with your health care provider about which countries are considered high risk.  Your parents were born in a high-risk country and you have not received a shot to protect against hepatitis B (hepatitis B vaccine).  You have HIV or AIDS.  You use needles to inject street  drugs.  You live with, or have sex with, someone who has hepatitis B.  You are a man who has sex with other men (MSM).  You get hemodialysis treatment.  You take certain medicines for conditions such as cancer, organ transplantation, and autoimmune conditions.  Hepatitis C blood testing is recommended for all people born from [birth year] through 1965 and any individual with known risks for hepatitis C.  Practice safe sex. Use condoms and avoid high-risk sexual practices to reduce the spread of sexually transmitted infections (STIs). STIs include gonorrhea, chlamydia, syphilis, trichomonas, herpes, HPV, and human immunodeficiency virus (HIV). Herpes, HIV, and HPV are viral illnesses that have no cure. They can result in disability, cancer, and death.  If you are a man who has sex with other men, you should be screened at least once per year for:  HIV.  Urethral, rectal, and pharyngeal infection of gonorrhea, chlamydia, or both.  If you are at  risk of being infected with HIV, it is recommended that you take a prescription medicine daily to prevent HIV infection. This is called preexposure prophylaxis (PrEP). You are considered at risk if:  You are a man who has sex with other men (MSM) and have other risk factors.  You are a heterosexual man, are sexually active, and are at increased risk for HIV infection.  You take drugs by injection.  You are sexually active with a partner who has HIV.  Talk with your health care provider about whether you are at high risk of being infected with HIV. If you choose to begin PrEP, you should first be tested for HIV. You should then be tested every 3 months for as long as you are taking PrEP.  A one-time screening for abdominal aortic aneurysm (AAA) and surgical repair of large AAAs by ultrasound are recommended for men ages 42 to 30 years who are current or former smokers.  Healthy men should no longer receive prostate-specific antigen (PSA) blood tests as  part of routine cancer screening. Talk with your health care provider about prostate cancer screening.  Testicular cancer screening is not recommended for adult males who have no symptoms. Screening includes self-exam, a health care provider exam, and other screening tests. Consult with your health care provider about any symptoms you have or any concerns you have about testicular cancer.  Use sunscreen. Apply sunscreen liberally and repeatedly throughout the day. You should seek shade when your shadow is shorter than you. Protect yourself by wearing long sleeves, pants, a wide-brimmed hat, and sunglasses year round, whenever you are outdoors.  Once a month, do a whole-body skin exam, using a mirror to look at the skin on your back. Tell your health care provider about new moles, moles that have irregular borders, moles that are larger than a pencil eraser, or moles that have changed in shape or color.  Stay current with required vaccines (immunizations).  Influenza vaccine. All adults should be immunized every year.  Tetanus, diphtheria, and acellular pertussis (Td, Tdap) vaccine. An adult who has not previously received Tdap or who does not know his vaccine status should receive 1 dose of Tdap. This initial dose should be followed by tetanus and diphtheria toxoids (Td) booster doses every 10 years. Adults with an unknown or incomplete history of completing a 3-dose immunization series with Td-containing vaccines should begin or complete a primary immunization series including a Tdap dose. Adults should receive a Td booster every 10 years.  Varicella vaccine. An adult without evidence of immunity to varicella should receive 2 doses or a second dose if he has previously received 1 dose.  Human papillomavirus (HPV) vaccine. Males aged 11-21 years who have not received the vaccine previously should receive the 3-dose series. Males aged 22-26 years may be immunized. Immunization is recommended through  the age of 9 years for any male who has sex with males and did not get any or all doses earlier. Immunization is recommended for any person with an immunocompromised condition through the age of 35 years if he did not get any or all doses earlier. During the 3-dose series, the second dose should be obtained 4-8 weeks after the first dose. The third dose should be obtained 24 weeks after the first dose and 16 weeks after the second dose.  Zoster vaccine. One dose is recommended for adults aged 58 years or older unless certain conditions are present.  Measles, mumps, and rubella (MMR) vaccine. Adults born before  1957 generally are considered immune to measles and mumps. Adults born in [birth year] or later should have 1 or more doses of MMR vaccine unless there is a contraindication to the vaccine or there is laboratory evidence of immunity to each of the three diseases. A routine second dose of MMR vaccine should be obtained at least 28 days after the first dose for students attending postsecondary schools, health care workers, or international travelers. People who received inactivated measles vaccine or an unknown type of measles vaccine during 1963-1967 should receive 2 doses of MMR vaccine. People who received inactivated mumps vaccine or an unknown type of mumps vaccine before 1979 and are at high risk for mumps infection should consider immunization with 2 doses of MMR vaccine. Unvaccinated health care workers born before [birth year] who lack laboratory evidence of measles, mumps, or rubella immunity or laboratory confirmation of disease should consider measles and mumps immunization with 2 doses of MMR vaccine or rubella immunization with 1 dose of MMR vaccine.  Pneumococcal 13-valent conjugate (PCV13) vaccine. When indicated, a person who is uncertain of his immunization history and has no record of immunization should receive the PCV13 vaccine. All adults 71 years of age and older should receive this vaccine. An  adult aged 69 years or older who has certain medical conditions and has not been previously immunized should receive 1 dose of PCV13 vaccine. This PCV13 should be followed with a dose of pneumococcal polysaccharide (PPSV23) vaccine. Adults who are at high risk for pneumococcal disease should obtain the PPSV23 vaccine at least 8 weeks after the dose of PCV13 vaccine. Adults older than 70 years of age who have normal immune system function should obtain the PPSV23 vaccine dose at least 1 year after the dose of PCV13 vaccine.  Pneumococcal polysaccharide (PPSV23) vaccine. When PCV13 is also indicated, PCV13 should be obtained first. All adults aged 42 years and older should be immunized. An adult younger than age 19 years who has certain medical conditions should be immunized. Any person who resides in a nursing home or long-term care facility should be immunized. An adult smoker should be immunized. People with an immunocompromised condition and certain other conditions should receive both PCV13 and PPSV23 vaccines. People with human immunodeficiency virus (HIV) infection should be immunized as soon as possible after diagnosis. Immunization during chemotherapy or radiation therapy should be avoided. Routine use of PPSV23 vaccine is not recommended for American Indians, [place] Natives, or people younger than 65 years unless there are medical conditions that require PPSV23 vaccine. When indicated, people who have unknown immunization and have no record of immunization should receive PPSV23 vaccine. One-time revaccination 5 years after the first dose of PPSV23 is recommended for people aged 19-64 years who have chronic kidney failure, nephrotic syndrome, asplenia, or immunocompromised conditions. People who received 1-2 doses of PPSV23 before age 67 years should receive another dose of PPSV23 vaccine at age 65 years or later if at least 5 years have passed since the previous dose. Doses of PPSV23 are not needed for  people immunized with PPSV23 at or after age 62 years.  Meningococcal vaccine. Adults with asplenia or persistent complement component deficiencies should receive 2 doses of quadrivalent meningococcal conjugate (MenACWY-D) vaccine. The doses should be obtained at least 2 months apart. Microbiologists working with certain meningococcal bacteria, [place] recruits, people at risk during an outbreak, and people who travel to or live in countries with a high rate of meningitis should be immunized. A first-year college student up through  age 44 years who is living in a residence hall should receive a dose if he did not receive a dose on or after his 16th birthday. Adults who have certain high-risk conditions should receive one or more doses of vaccine.  Hepatitis A vaccine. Adults who wish to be protected from this disease, have chronic liver disease, work with hepatitis A-infected animals, work in hepatitis A research labs, or travel to or work in countries with a high rate of hepatitis A should be immunized. Adults who were previously unvaccinated and who anticipate close contact with an international adoptee during the first 60 days after arrival in the [place] from a country with a high rate of hepatitis A should be immunized.  Hepatitis B vaccine. Adults should be immunized if they wish to be protected from this disease, are under age 41 years and have diabetes, have chronic liver disease, have had more than one sex partner in the past 6 months, may be exposed to blood or other infectious body fluids, are household contacts or sex partners of hepatitis B positive people, are clients or workers in certain care facilities, or travel to or work in countries with a high rate of hepatitis B.  Haemophilus influenzae type b (Hib) vaccine. A previously unvaccinated person with asplenia or sickle cell disease or having a scheduled splenectomy should receive 1 dose of Hib vaccine. Regardless of previous  immunization, a recipient of a hematopoietic stem cell transplant should receive a 3-dose series 6-12 months after his successful transplant. Hib vaccine is not recommended for adults with HIV infection. Preventive Service / Frequency Ages 2 to 41  Blood pressure check.** / Every 3-5 years.  Lipid and cholesterol check.** / Every 5 years beginning at age 65.  Hepatitis C blood test.** / For any individual with known risks for hepatitis C.  Skin self-exam. / Monthly.  Influenza vaccine. / Every year.  Tetanus, diphtheria, and acellular pertussis (Tdap, Td) vaccine.** / Consult your health care provider. 1 dose of Td every 10 years.  Varicella vaccine.** / Consult your health care provider.  HPV vaccine. / 3 doses over 6 months, if 43 or younger.  Measles, mumps, rubella (MMR) vaccine.** / You need at least 1 dose of MMR if you were born in 1957 or later. You may also need a second dose.  Pneumococcal 13-valent conjugate (PCV13) vaccine.** / Consult your health care provider.  Pneumococcal polysaccharide (PPSV23) vaccine.** / 1 to 2 doses if you smoke cigarettes or if you have certain conditions.  Meningococcal vaccine.** / 1 dose if you are age 29 to 34 years and a Market researcher living in a residence hall, or have one of several medical conditions. You may also need additional booster doses.  Hepatitis A vaccine.** / Consult your health care provider.  Hepatitis B vaccine.** / Consult your health care provider.  Haemophilus influenzae type b (Hib) vaccine.** / Consult your health care provider. Ages 77 to 12  Blood pressure check.** / Every year.  Lipid and cholesterol check.** / Every 5 years beginning at age 53.  Lung cancer screening. / Every year if you are aged 13-80 years and have a 30-pack-year history of smoking and currently smoke or have quit within the past 15 years. Yearly screening is stopped once you have quit smoking for at least 15 years or develop  a health problem that would prevent you from having lung cancer treatment.  Fecal occult blood test (FOBT) of stool. / Every year beginning at  age 66 and continuing until age 75. You may not have to do this test if you get a colonoscopy every 10 years.  Flexible sigmoidoscopy** or colonoscopy.** / Every 5 years for a flexible sigmoidoscopy or every 10 years for a colonoscopy beginning at age 50 and continuing until age 66.  Hepatitis C blood test.** / For all people born from [birth year] through 1965 and any individual with known risks for hepatitis C.  Skin self-exam. / Monthly.  Influenza vaccine. / Every year.  Tetanus, diphtheria, and acellular pertussis (Tdap/Td) vaccine.** / Consult your health care provider. 1 dose of Td every 10 years.  Varicella vaccine.** / Consult your health care provider.  Zoster vaccine.** / 1 dose for adults aged 52 years or older.  Measles, mumps, rubella (MMR) vaccine.** / You need at least 1 dose of MMR if you were born in 1957 or later. You may also need a second dose.  Pneumococcal 13-valent conjugate (PCV13) vaccine.** / Consult your health care provider.  Pneumococcal polysaccharide (PPSV23) vaccine.** / 1 to 2 doses if you smoke cigarettes or if you have certain conditions.  Meningococcal vaccine.** / Consult your health care provider.  Hepatitis A vaccine.** / Consult your health care provider.  Hepatitis B vaccine.** / Consult your health care provider.  Haemophilus influenzae type b (Hib) vaccine.** / Consult your health care provider. Ages 28 and over  Blood pressure check.** / Every year.  Lipid and cholesterol check.**/ Every 5 years beginning at age 49.  Lung cancer screening. / Every year if you are aged 35-80 years and have a 30-pack-year history of smoking and currently smoke or have quit within the past 15 years. Yearly screening is stopped once you have quit smoking for at least 15 years or develop a health problem that would prevent  you from having lung cancer treatment.  Fecal occult blood test (FOBT) of stool. / Every year beginning at age 45 and continuing until age 55. You may not have to do this test if you get a colonoscopy every 10 years.  Flexible sigmoidoscopy** or colonoscopy.** / Every 5 years for a flexible sigmoidoscopy or every 10 years for a colonoscopy beginning at age 72 and continuing until age 18.  Hepatitis C blood test.** / For all people born from [birth year] through 1965 and any individual with known risks for hepatitis C.  Abdominal aortic aneurysm (AAA) screening.** / A one-time screening for ages 21 to 13 years who are current or former smokers.  Skin self-exam. / Monthly.  Influenza vaccine. / Every year.  Tetanus, diphtheria, and acellular pertussis (Tdap/Td) vaccine.** / 1 dose of Td every 10 years.  Varicella vaccine.** / Consult your health care provider.  Zoster vaccine.** / 1 dose for adults aged 104 years or older.  Pneumococcal 13-valent conjugate (PCV13) vaccine.** / 1 dose for all adults aged 64 years and older.  Pneumococcal polysaccharide (PPSV23) vaccine.** / 1 dose for all adults aged 64 years and older.  Meningococcal vaccine.** / Consult your health care provider.  Hepatitis A vaccine.** / Consult your health care provider.  Hepatitis B vaccine.** / Consult your health care provider.  Haemophilus influenzae type b (Hib) vaccine.** / Consult your health care provider. **Family history and personal history of risk and conditions may change your health care provider's recommendations.   This information is not intended to replace advice given to you by your health care provider. Make sure you discuss any questions you have with your health care provider.   Document  Released: 02/24/2001 Document Revised: 01/19/2014 Document Reviewed: 05/26/2010 Elsevier Interactive Patient Education [employer].

## 2015-03-08 ENCOUNTER — Other Ambulatory Visit (INDEPENDENT_AMBULATORY_CARE_PROVIDER_SITE_OTHER): Payer: Medicare Other

## 2015-03-08 DIAGNOSIS — E0590 Thyrotoxicosis, unspecified without thyrotoxic crisis or storm: Secondary | ICD-10-CM

## 2015-03-08 LAB — T4, FREE: FREE T4: 0.85 ng/dL (ref 0.60–1.60)

## 2015-03-17 NOTE — Assessment & Plan Note (Addendum)
Patient denies any difficulties at home. No trouble with ADLs, depression or falls. See EMR for functional status screen and depression screen. No recent changes to vision or hearing. Is UTD with immunizations. Is UTD with screening. Discussed Advanced Directives. Encouraged heart healthy diet, exercise as tolerated and adequate sleep. See patient's problem list for health risk factors to monitor. See AVS for preventative healthcare recommendation schedule. Labs reviewed Follows with gastroenterology, Dr Carlean Purl, due for colonoscopy Given rx for Zostavax to get at pharmacy Flu shot today folows with Dr [facility] cardiology Follows with Dr Pierre Bali of opthamology

## 2015-03-17 NOTE — Assessment & Plan Note (Signed)
Encouraged DASH diet, decrease po intake and increase exercise as tolerated. Needs 7-8 hours of sleep nightly. Avoid trans fats, eat small, frequent meals every 4-5 hours with lean proteins, complex carbs and healthy fats. Minimize simple carbs, GMO foods. 

## 2015-03-17 NOTE — Assessment & Plan Note (Signed)
Sertraline 50 mg daily and referred to behavioral health for counseling.

## 2015-03-17 NOTE — Assessment & Plan Note (Signed)
Tolerating statin, encouraged heart healthy diet, avoid trans fats, minimize simple carbs and saturated fats. Increase exercise as tolerated 

## 2015-03-17 NOTE — Assessment & Plan Note (Signed)
Avoid offending foods, take probiotics. Do not eat large meals in late evening and consider raising head of bed.  

## 2015-04-03 ENCOUNTER — Other Ambulatory Visit: Payer: Self-pay | Admitting: Family Medicine

## 2015-04-24 ENCOUNTER — Telehealth: Payer: Self-pay | Admitting: *Deleted

## 2015-04-24 NOTE — Telephone Encounter (Signed)
Received fax from pharmacy stating that Sertraline 50 mg twice daily is not covered by [employer]; they will cover 100mg  once daily and/or a Prior Authorization will be needed [[employer] [place] Jill Alexanders Bin [id number], Member ID [id number]; Please Advise/SLS 04/12

## 2015-04-25 MED ORDER — SERTRALINE HCL 100 MG PO TABS: 100.0000 mg | ORAL_TABLET | Freq: Every day | ORAL | Status: DC

## 2015-04-25 NOTE — Telephone Encounter (Signed)
Patient has been notifed verbalized understanding to switch his sertraline to 100 mg daily.

## 2015-04-25 NOTE — Telephone Encounter (Signed)
Please let patient know about the denial and then switch him to Sertraline 100 mg tabs, he can either take 1/2 tab po bid or 1 tab po dailly

## 2015-04-25 NOTE — Telephone Encounter (Signed)
Left message on machine.

## 2015-05-02 ENCOUNTER — Encounter (HOSPITAL_BASED_OUTPATIENT_CLINIC_OR_DEPARTMENT_OTHER): Payer: Self-pay | Admitting: *Deleted

## 2015-05-02 ENCOUNTER — Emergency Department (HOSPITAL_BASED_OUTPATIENT_CLINIC_OR_DEPARTMENT_OTHER)
Admission: EM | Admit: 2015-05-02 | Discharge: 2015-05-02 | Disposition: A | Discharge status: Home / Self Care | Payer: Medicare Other | Attending: Emergency Medicine | Admitting: Emergency Medicine

## 2015-05-02 DIAGNOSIS — M109 Gout, unspecified: Secondary | ICD-10-CM | POA: Insufficient documentation

## 2015-05-02 DIAGNOSIS — Z79899 Other long term (current) drug therapy: Secondary | ICD-10-CM | POA: Insufficient documentation

## 2015-05-02 DIAGNOSIS — K219 Gastro-esophageal reflux disease without esophagitis: Secondary | ICD-10-CM | POA: Diagnosis not present

## 2015-05-02 DIAGNOSIS — F419 Anxiety disorder, unspecified: Secondary | ICD-10-CM | POA: Diagnosis not present

## 2015-05-02 DIAGNOSIS — Z7982 Long term (current) use of aspirin: Secondary | ICD-10-CM | POA: Insufficient documentation

## 2015-05-02 DIAGNOSIS — Z87891 Personal history of nicotine dependence: Secondary | ICD-10-CM | POA: Diagnosis not present

## 2015-05-02 DIAGNOSIS — E785 Hyperlipidemia, unspecified: Secondary | ICD-10-CM | POA: Insufficient documentation

## 2015-05-02 DIAGNOSIS — Z8619 Personal history of other infectious and parasitic diseases: Secondary | ICD-10-CM | POA: Insufficient documentation

## 2015-05-02 DIAGNOSIS — Z85828 Personal history of other malignant neoplasm of skin: Secondary | ICD-10-CM | POA: Diagnosis not present

## 2015-05-02 DIAGNOSIS — Z880 Allergy status to penicillin: Secondary | ICD-10-CM | POA: Diagnosis not present

## 2015-05-02 DIAGNOSIS — I10 Essential (primary) hypertension: Secondary | ICD-10-CM | POA: Diagnosis not present

## 2015-05-02 NOTE — ED Notes (Signed)
His BP was elevated at the dentist office today. He is not having any symptoms.

## 2015-05-02 NOTE — Discharge Instructions (Signed)
How to Take Your Blood Pressure On arrival here your blood pressure was 169/74. Repeated while here it was 155/82. Call Dr.Blyth to arrange to get your blood pressure rechecked in her office within the next 3 weeks. Return if concerned for any reason. HOW DO I GET A BLOOD PRESSURE MACHINE?  You can buy an electronic home blood pressure machine at your local pharmacy. Insurance will sometimes cover the cost if you have a prescription.  Ask your doctor what type of machine is best for you. There are different machines for your arm and your wrist.  If you decide to buy a machine to check your blood pressure on your arm, first check the size of your arm so you can buy the right size cuff. To check the size of your arm:   Use a measuring tape that shows both inches and centimeters.   Wrap the measuring tape around the upper-middle part of your arm. You may need someone to help you measure.   Write down your arm measurement in both inches and centimeters.   To measure your blood pressure correctly, it is important to have the right size cuff.   If your arm is up to 13 inches (up to 34 centimeters), get an adult cuff size.  If your arm is 13 to 17 inches (35 to 44 centimeters), get a large adult cuff size.    If your arm is 17 to 20 inches (45 to 52 centimeters), get an adult thigh cuff.  WHAT DO THE NUMBERS MEAN?   There are two numbers that make up your blood pressure. For example: 120/80.  The first number (120 in our example) is called the "systolic pressure." It is a measure of the pressure in your blood vessels when your heart is pumping blood.  The second number (80 in our example) is called the "diastolic pressure." It is a measure of the pressure in your blood vessels when your heart is resting between beats.  Your doctor will tell you what your blood pressure should be. WHAT SHOULD I DO BEFORE I CHECK MY BLOOD PRESSURE?   Try to rest or relax for at least 30 minutes before  you check your blood pressure.  Do not smoke.  Do not have any drinks with caffeine, such as:  Soda.  Coffee.  Tea.  Check your blood pressure in a quiet room.  Sit down and stretch out your arm on a table. Keep your arm at about the level of your heart. Let your arm relax.  Make sure that your legs are not crossed. HOW DO I CHECK MY BLOOD PRESSURE?  Follow the directions that came with your machine.  Make sure you remove any tight-fitting clothing from your arm or wrist. Wrap the cuff around your upper arm or wrist. You should be able to fit a finger between the cuff and your arm. If you cannot fit a finger between the cuff and your arm, it is too tight and should be removed and rewrapped.  Some units require you to manually pump up the arm cuff.  Automatic units inflate the cuff when you press a button.  Cuff deflation is automatic in both models.  After the cuff is inflated, the unit measures your blood pressure and pulse. The readings are shown on a monitor. Hold still and breathe normally while the cuff is inflated.  Getting a reading takes less than a minute.  Some models store readings in a memory. Some provide a printout  of readings. If your machine does not store your readings, keep a written record.  Take readings with you to your next visit with your doctor.   This information is not intended to replace advice given to you by your health care provider. Make sure you discuss any questions you have with your health care provider.   Document Released: 12/12/2007 Document Revised: 01/19/2014 Document Reviewed: 02/23/2013 Elsevier Interactive Patient Education [employer].

## 2015-05-02 NOTE — ED Provider Notes (Signed)
CSN: [id number]     Arrival date & time 05/02/15  1226 History   First MD Initiated Contact with Patient 05/02/15 1334     Chief Complaint  Patient presents with  . Hypertension     (Consider location/radiation/quality/duration/timing/severity/associated sxs/prior Treatment) HPI Patient was seen by his dentist at 11:30 AM today for routine dental cleaning and checkup blood pressure was taken and it was noted to be 185/110. Patient is asymptomatic. He took his blood pressure medication this morning at 9:30 AM, which is his usual routine. He denies any chest pain denies headache denies abdominal pain denies visual changes no treatment prior to coming here. He remains asymptomatic Past Medical History  Diagnosis Date  . LAFB (left anterior fascicular block)   . Diverticulitis   . Hypertension   . Premature ventricular complex   . Atrial fibrillation ([place])   . Hyperlipidemia   . Gout   . Anxiety   . Back pain   . Internal hemorrhoids   . GERD (gastroesophageal reflux disease)   . Diverticulosis   . BCC (basal cell carcinoma of skin)     under right eye and right ear  . Rectal irritation 09/18/2012  . Other malaise and fatigue 03/13/2013  . Tinea corporis 03/13/2013  . Abnormal liver function 03/18/2013  . IBS (irritable bowel syndrome) 03/18/2013  . Unspecified constipation 09/25/2013  . Medicare annual wellness visit, subsequent 03/12/2014  . Preventative health care 03/07/2015   Past Surgical History  Procedure Laterality Date  . Nose surgery    . Colonoscopy    . Colon surgery  2009    segmental sigmoid resection  . Skin biopsy     Family History  Problem Relation Age of Onset  . Heart disease Mother   . Cancer Mother     colon, breast  . Hypertension Mother   . Stroke Mother   . Heart disease Father     pacemaker  . Aortic aneurysm Father   . Hypertension Father   . Heart disease Sister   . Atrial fibrillation Sister   . Obesity Sister   . Sleep apnea Sister   . Heart  attack Brother   . Other Brother     muscle disease  . Arthritis Brother   . Stroke Brother   . Cancer Maternal Grandmother     ?  Marland Kitchen Heart attack Maternal Grandmother   . Diabetes Maternal Grandmother   . Cancer Maternal Grandfather     hodgin's lymphoma  . Heart attack Paternal Grandmother   . Anxiety disorder Paternal Grandmother   . Pneumonia Paternal Grandfather   . Heart attack Brother   . Atrial fibrillation Brother   . Heart attack Brother   . Hypertension Brother   . Hyperlipidemia Brother   . Heart attack Brother   . Other Brother     heart valve operation   Social History  Substance Use Topics  . Smoking status: Former Smoker -- 1.00 packs/day for 6 years    Types: Cigarettes    Start date: 01/12/1969  . Smokeless tobacco: Never Used  . Alcohol Use: Yes     Comment: 2 drinks every night    Review of Systems  Constitutional: Negative.   HENT: Negative.   Respiratory: Negative.   Cardiovascular: Negative.   Gastrointestinal: Negative.   Musculoskeletal: Negative.   Skin: Negative.   Neurological: Negative.   Psychiatric/Behavioral: Negative.   All other systems reviewed and are negative.     Allergies  Cefaclor;  Cephalosporins; and Penicillins  Home Medications   Prior to Admission medications   Medication Sig Start Date End Date Taking? Authorizing Provider  alum hydroxide-mag trisilicate (GAVISCON) [id number] MG CHEW Chew by mouth.    Historical Provider, MD  aspirin 81 MG tablet Take 81 mg by mouth daily as needed.     Historical Provider, MD  atenolol (TENORMIN) 25 MG tablet TAKE ONE TABLET (25 MG) BY MOUTH TWO TIMES DAILY. 03/06/15   Mosie Lukes, MD  atorvastatin (LIPITOR) 40 MG tablet Take 20 mg by mouth 2 (two) times daily.     Historical Provider, MD  cyclobenzaprine (FLEXERIL) 5 MG tablet Take 1 tablet (5 mg total) by mouth 3 (three) times daily as needed for muscle spasms. 03/02/14   Mosie Lukes, MD  fluticasone (FLONASE) 50 MCG/ACT nasal  spray Place 2 sprays into the nose daily as needed. For congestion    Historical Provider, MD  LORazepam (ATIVAN) 1 MG tablet TAKE 1 TABLET BY MOUTH EVERY 8 HOURS AS NEEDED FOR ANXIETY OR SLEEP 03/07/15   Mosie Lukes, MD  losartan (COZAAR) 25 MG tablet TAKE ONE TABLET (25 MG) BY MOUTH DAILY. 04/03/15   Mosie Lukes, MD  meloxicam (MOBIC) 15 MG tablet PRN As directed 02/11/12   Historical Provider, MD  omeprazole (PRILOSEC) 20 MG capsule TAKE ONE CAPSULE BY MOUTH EVERY DAY 08/20/14   Mosie Lukes, MD  omeprazole (PRILOSEC) 20 MG capsule TAKE ONE CAPSULE BY MOUTH EVERY DAY 08/22/14   Brunetta Jeans, PA-C  PROCTOZONE-HC 2.5 % rectal cream Place 1 application rectally as needed.  08/29/12   Historical Provider, MD  sertraline (ZOLOFT) 100 MG tablet Take 1 tablet (100 mg total) by mouth daily. 04/25/15   Mosie Lukes, MD  zoster vaccine live, PF, (ZOSTAVAX) 91478 UNT/0.65ML injection Inject 19,400 Units into the skin once. 03/07/15   Mosie Lukes, MD   BP 169/74 mmHg  Pulse 66  Temp(Src) 97.9 F (36.6 C) (Oral)  Resp 20  Ht 6\' 1"  (1.854 m)  Wt 279 lb (126.554 kg)  BMI 36.82 kg/m2  SpO2 96% Physical Exam  Constitutional: He is oriented to person, place, and time. He appears well-developed and well-nourished. No distress.  HENT:  Head: Normocephalic and atraumatic.  Eyes: Conjunctivae are normal. Pupils are equal, round, and reactive to light.  Neck: Neck supple. No tracheal deviation present. No thyromegaly present.  Cardiovascular: Normal rate and regular rhythm.   No murmur heard. Pulmonary/Chest: Effort normal and breath sounds normal.  Abdominal: Soft. Bowel sounds are normal. He exhibits no distension. There is no tenderness.  Obese  Musculoskeletal: Normal range of motion. He exhibits no edema or tenderness.  Neurological: He is alert and oriented to person, place, and time. Coordination normal.  Skin: Skin is warm and dry. No rash noted.  Psychiatric: He has a normal mood and  affect.  Nursing note and vitals reviewed.   ED Course  Procedures (including critical care time) Labs Review Labs Reviewed - No data to display  Imaging Review No results found. I have personally reviewed and evaluated these images and lab results as part of my medical decision-making.   EKG Interpretation None      MDM  No symptoms from elevated blood pressure. No diagnostic workup needed. Blood pressure has come down spontaneously. Suggest follow-up with Dr.Blyth to get blood pressure recheck within the next 3 weeks Diagnoses hypertension Final diagnoses:  None        David [name]  Winfred Leeds, MD 05/02/15 1408

## 2015-05-02 NOTE — ED Notes (Signed)
Ambulatory to discharge window with steady gait. No new Rx given. Verbalizes understanding to f/u with PCP for b/p check in 3 weeks

## 2015-05-06 ENCOUNTER — Telehealth: Payer: Self-pay | Admitting: Family Medicine

## 2015-05-06 NOTE — Telephone Encounter (Signed)
ED Follow up;

## 2015-05-06 NOTE — Telephone Encounter (Signed)
Patient scheduled with Dr. Lorelei Pont for 05/09/2015

## 2015-05-06 NOTE — Telephone Encounter (Signed)
Tiffany can you please assist with this one?

## 2015-05-06 NOTE — Telephone Encounter (Signed)
Caller name:Deshaies, Earnestine Relationship to patient: Can be reached:[phone]  Pharmacy:  Reason for call:patient was told to follow up with Dr Charlett Blake in around 3 to 4 works. No appts available. Please advise

## 2015-05-06 NOTE — Telephone Encounter (Signed)
What does he need a follow up for? Recent hospitalization? Can we find him a 15 minute slot or can he see someone else?

## 2015-05-09 ENCOUNTER — Encounter: Payer: Self-pay | Admitting: Family Medicine

## 2015-05-09 ENCOUNTER — Ambulatory Visit (INDEPENDENT_AMBULATORY_CARE_PROVIDER_SITE_OTHER): Payer: Medicare Other | Admitting: Family Medicine

## 2015-05-09 VITALS — BP 132/79 | HR 62 | Temp 98.1°F | Ht 72.75 in | Wt 274.0 lb

## 2015-05-09 DIAGNOSIS — I10 Essential (primary) hypertension: Secondary | ICD-10-CM | POA: Diagnosis not present

## 2015-05-09 DIAGNOSIS — E663 Overweight: Secondary | ICD-10-CM | POA: Diagnosis not present

## 2015-05-09 NOTE — Progress Notes (Signed)
Pre visit review using our clinic tool,if applicable. No additional management support is needed unless otherwise documented below in the visit note.  

## 2015-05-09 NOTE — Progress Notes (Signed)
[name] at [facility] [address] [place], [place] [address] [phone]  Date:  05/09/2015   Name:  David [name]   DOB:  Nov 15, 1945   MRN:  [id number]  PCP:  Penni Homans, MD    Chief Complaint: Follow-up   History of Present Illness:  David [name] is a 70 y.o. very pleasant male patient who presents with the following:  He was seen in the ER a week ago after he went to get his teeth cleaned and his BP was found to be very elevated.   He had taken his atenolol and losartan as usual.  He had been under some stress with marital and financial stressors.   He has tried to start walking some since his ER visit. Checking his BP at HT- running 128/ 80- 90.   He has not noted any SOB.  He does have some TMJ but otherwise no HA No CP No recent change in his medications  BP Readings from Last 3 Encounters:  05/09/15 132/79  05/02/15 155/82  03/07/15 148/88   Wt Readings from Last 3 Encounters:  05/09/15 274 lb (124.286 kg)  05/02/15 279 lb (126.554 kg)  03/07/15 279 lb 4 oz (126.667 kg)     Patient Active Problem List   Diagnosis Date Noted  . Preventative health care 03/07/2015  . Lipoma of axilla 06/04/2014  . Tinea cruris 03/20/2014  . Lichen simplex chronicus 03/20/2014  . Medicare annual wellness visit, subsequent 03/12/2014  . Depression with anxiety 09/25/2013  . Constipation 09/25/2013  . Gouty arthritis of toe of left foot 08/01/2013  . Vitamin D deficiency 06/18/2013  . Skin rash 05/27/2013  . Abnormal liver function 03/18/2013  . IBS (irritable bowel syndrome) 03/18/2013  . Other malaise and fatigue 03/13/2013  . Tinea corporis 03/13/2013  . Gout 12/14/2012  . Diarrhea 12/14/2012  . Renal insufficiency 12/14/2012  . Rectal irritation 09/18/2012  . Diverticulosis   . BCC (basal cell carcinoma of skin)   . Scoliosis 01/19/2011  . Atrial fibrillation ([place]) 11/27/2010  . Hyperlipidemia, mixed  03/11/2010  . Essential hypertension, benign 03/11/2010  . ALLERGIC RHINITIS 03/11/2010  . Overweight 11/25/2007  . BENIGN NEOPLASM OF ADRENAL GLAND 09/22/2007  . Fatty liver disease, nonalcoholic 0000000  . DEGENERATIVE JOINT DISEASE 09/22/2007  . SPONDYLOSIS, LUMBAR 09/22/2007  . GERD 04/12/2007    Past Medical History  Diagnosis Date  . LAFB (left anterior fascicular block)   . Diverticulitis   . Hypertension   . Premature ventricular complex   . Atrial fibrillation ([place])   . Hyperlipidemia   . Gout   . Anxiety   . Back pain   . Internal hemorrhoids   . GERD (gastroesophageal reflux disease)   . Diverticulosis   . BCC (basal cell carcinoma of skin)     under right eye and right ear  . Rectal irritation 09/18/2012  . Other malaise and fatigue 03/13/2013  . Tinea corporis 03/13/2013  . Abnormal liver function 03/18/2013  . IBS (irritable bowel syndrome) 03/18/2013  . Unspecified constipation 09/25/2013  . Medicare annual wellness visit, subsequent 03/12/2014  . Preventative health care 03/07/2015    Past Surgical History  Procedure Laterality Date  . Nose surgery    . Colonoscopy    . Colon surgery  2009    segmental sigmoid resection  . Skin biopsy      Social History  Substance Use Topics  . Smoking  status: Former Smoker -- 1.00 packs/day for 6 years    Types: Cigarettes    Start date: 01/12/1969  . Smokeless tobacco: Never Used  . Alcohol Use: Yes     Comment: 2 drinks every night    Family History  Problem Relation Age of Onset  . Heart disease Mother   . Cancer Mother     colon, breast  . Hypertension Mother   . Stroke Mother   . Heart disease Father     pacemaker  . Aortic aneurysm Father   . Hypertension Father   . Heart disease Sister   . Atrial fibrillation Sister   . Obesity Sister   . Sleep apnea Sister   . Heart attack Brother   . Other Brother     muscle disease  . Arthritis Brother   . Stroke Brother   . Cancer Maternal Grandmother      ?  Marland Kitchen Heart attack Maternal Grandmother   . Diabetes Maternal Grandmother   . Cancer Maternal Grandfather     hodgin's lymphoma  . Heart attack Paternal Grandmother   . Anxiety disorder Paternal Grandmother   . Pneumonia Paternal Grandfather   . Heart attack Brother   . Atrial fibrillation Brother   . Heart attack Brother   . Hypertension Brother   . Hyperlipidemia Brother   . Heart attack Brother   . Other Brother     heart valve operation    Allergies  Allergen Reactions  . Cefaclor Hives  . Cephalosporins Hives  . Penicillins Rash    Mild maculopapular rash    Medication list has been reviewed and updated.  Current Outpatient Prescriptions on File Prior to Visit  Medication Sig Dispense Refill  . alum hydroxide-mag trisilicate (GAVISCON) [id number] MG CHEW Chew by mouth.    Marland Kitchen aspirin 81 MG tablet Take 81 mg by mouth daily as needed.     Marland Kitchen atenolol (TENORMIN) 25 MG tablet TAKE ONE TABLET (25 MG) BY MOUTH TWO TIMES DAILY. 60 tablet 3  . atorvastatin (LIPITOR) 40 MG tablet Take 20 mg by mouth 2 (two) times daily.     . cyclobenzaprine (FLEXERIL) 5 MG tablet Take 1 tablet (5 mg total) by mouth 3 (three) times daily as needed for muscle spasms. 30 tablet 1  . fluticasone (FLONASE) 50 MCG/ACT nasal spray Place 2 sprays into the nose daily as needed. For congestion    . LORazepam (ATIVAN) 1 MG tablet TAKE 1 TABLET BY MOUTH EVERY 8 HOURS AS NEEDED FOR ANXIETY OR SLEEP 90 tablet 3  . losartan (COZAAR) 25 MG tablet TAKE ONE TABLET (25 MG) BY MOUTH DAILY. 30 tablet 5  . meloxicam (MOBIC) 15 MG tablet PRN As directed    . omeprazole (PRILOSEC) 20 MG capsule TAKE ONE CAPSULE BY MOUTH EVERY DAY 30 capsule 3  . omeprazole (PRILOSEC) 20 MG capsule TAKE ONE CAPSULE BY MOUTH EVERY DAY 30 capsule 3  . PROCTOZONE-HC 2.5 % rectal cream Place 1 application rectally as needed.     . sertraline (ZOLOFT) 100 MG tablet Take 1 tablet (100 mg total) by mouth daily. 60 tablet 2  . zoster vaccine live, PF,  (ZOSTAVAX) [address] UNT/0.65ML injection Inject 19,400 Units into the skin once. 1 each 0   No current facility-administered medications on file prior to visit.    Review of Systems:  As per HPI- otherwise negative.   Physical Examination: Filed Vitals:   05/09/15 0845  BP: 132/79  Pulse: 62  Temp: 98.1  F (36.7 C)   Filed Vitals:   05/09/15 0845  Height: 6' 0.75" (1.848 m)  Weight: 274 lb (124.286 kg)   Body mass index is 36.39 kg/(m^2). Ideal Body Weight: Weight in (lb) to have BMI = 25: 187.8  GEN: WDWN, NAD, Non-toxic, A & O x 3, obese, looks well HEENT: Atraumatic, Normocephalic. Neck supple. No masses, No LAD. Ears and Nose: No external deformity. CV: RRR, No M/G/R. No JVD. No thrill. No extra heart sounds. PULM: CTA B, no wheezes, crackles, rhonchi. No retractions. No resp. distress. No accessory muscle use. EXTR: No c/c/e NEURO Normal gait.  PSYCH: Normally interactive. Conversant. Not depressed or anxious appearing.  Calm demeanor.    Assessment and Plan: Essential hypertension  Overweight  Here today to recheck his BP- it looks fine. He has lost some weight and is trying to get healthier in general He will see his PCP next month, for the time being continue medications   Signed Lamar Blinks, MD

## 2015-05-09 NOTE — Patient Instructions (Signed)
It was good to see you today- keep up the great work with exercise and trying to lose weight Please see [place] in May- let me know if any concerns in the meantime and continue to keep an eye on your blood pressure once a week or so.

## 2015-06-07 ENCOUNTER — Ambulatory Visit (INDEPENDENT_AMBULATORY_CARE_PROVIDER_SITE_OTHER): Payer: Medicare Other | Admitting: Family Medicine

## 2015-06-07 ENCOUNTER — Encounter: Payer: Self-pay | Admitting: Family Medicine

## 2015-06-07 ENCOUNTER — Other Ambulatory Visit (INDEPENDENT_AMBULATORY_CARE_PROVIDER_SITE_OTHER): Payer: Medicare Other

## 2015-06-07 ENCOUNTER — Ambulatory Visit (HOSPITAL_BASED_OUTPATIENT_CLINIC_OR_DEPARTMENT_OTHER)
Admission: RE | Admit: 2015-06-07 | Discharge: 2015-06-07 | Disposition: A | Discharge status: Home / Self Care | Payer: Medicare Other | Source: Ambulatory Visit | Attending: Family Medicine | Admitting: Family Medicine

## 2015-06-07 VITALS — BP 140/90 | HR 66 | Temp 98.2°F | Ht 72.0 in | Wt 274.1 lb

## 2015-06-07 DIAGNOSIS — I708 Atherosclerosis of other arteries: Secondary | ICD-10-CM | POA: Diagnosis not present

## 2015-06-07 DIAGNOSIS — N289 Disorder of kidney and ureter, unspecified: Secondary | ICD-10-CM | POA: Diagnosis not present

## 2015-06-07 DIAGNOSIS — R400 Somnolence: Secondary | ICD-10-CM

## 2015-06-07 DIAGNOSIS — M549 Dorsalgia, unspecified: Secondary | ICD-10-CM | POA: Insufficient documentation

## 2015-06-07 DIAGNOSIS — N281 Cyst of kidney, acquired: Secondary | ICD-10-CM

## 2015-06-07 DIAGNOSIS — K6289 Other specified diseases of anus and rectum: Secondary | ICD-10-CM

## 2015-06-07 DIAGNOSIS — E782 Mixed hyperlipidemia: Secondary | ICD-10-CM | POA: Diagnosis not present

## 2015-06-07 DIAGNOSIS — R739 Hyperglycemia, unspecified: Secondary | ICD-10-CM | POA: Diagnosis not present

## 2015-06-07 DIAGNOSIS — I700 Atherosclerosis of aorta: Secondary | ICD-10-CM | POA: Diagnosis not present

## 2015-06-07 DIAGNOSIS — G4489 Other headache syndrome: Secondary | ICD-10-CM

## 2015-06-07 DIAGNOSIS — R945 Abnormal results of liver function studies: Secondary | ICD-10-CM

## 2015-06-07 DIAGNOSIS — M5116 Intervertebral disc disorders with radiculopathy, lumbar region: Secondary | ICD-10-CM | POA: Diagnosis not present

## 2015-06-07 DIAGNOSIS — E669 Obesity, unspecified: Secondary | ICD-10-CM

## 2015-06-07 DIAGNOSIS — M109 Gout, unspecified: Secondary | ICD-10-CM

## 2015-06-07 DIAGNOSIS — I480 Paroxysmal atrial fibrillation: Secondary | ICD-10-CM | POA: Diagnosis not present

## 2015-06-07 DIAGNOSIS — K769 Liver disease, unspecified: Secondary | ICD-10-CM | POA: Diagnosis not present

## 2015-06-07 DIAGNOSIS — K760 Fatty (change of) liver, not elsewhere classified: Secondary | ICD-10-CM

## 2015-06-07 DIAGNOSIS — E559 Vitamin D deficiency, unspecified: Secondary | ICD-10-CM

## 2015-06-07 DIAGNOSIS — K7689 Other specified diseases of liver: Secondary | ICD-10-CM

## 2015-06-07 DIAGNOSIS — R0683 Snoring: Secondary | ICD-10-CM | POA: Diagnosis not present

## 2015-06-07 DIAGNOSIS — K219 Gastro-esophageal reflux disease without esophagitis: Secondary | ICD-10-CM

## 2015-06-07 DIAGNOSIS — M4186 Other forms of scoliosis, lumbar region: Secondary | ICD-10-CM | POA: Diagnosis not present

## 2015-06-07 DIAGNOSIS — N200 Calculus of kidney: Secondary | ICD-10-CM | POA: Diagnosis not present

## 2015-06-07 DIAGNOSIS — I10 Essential (primary) hypertension: Secondary | ICD-10-CM

## 2015-06-07 DIAGNOSIS — Q6100 Congenital renal cyst, unspecified: Secondary | ICD-10-CM

## 2015-06-07 DIAGNOSIS — K5900 Constipation, unspecified: Secondary | ICD-10-CM

## 2015-06-07 HISTORY — DX: Liver disease, unspecified: K76.9

## 2015-06-07 HISTORY — DX: Disorder of kidney and ureter, unspecified: N28.9

## 2015-06-07 LAB — CBC
HCT: 43.4 % (ref 39.0–52.0)
HEMOGLOBIN: 14.5 g/dL (ref 13.0–17.0)
MCHC: 33.3 g/dL (ref 30.0–36.0)
MCV: 93.7 fl (ref 78.0–100.0)
Platelets: 203 10*3/uL (ref 150.0–400.0)
RBC: 4.64 Mil/uL (ref 4.22–5.81)
RDW: 14.2 % (ref 11.5–15.5)
WBC: 6.7 10*3/uL (ref 4.0–10.5)

## 2015-06-07 LAB — LIPID PANEL
CHOL/HDL RATIO: 5
Cholesterol: 151 mg/dL (ref 0–200)
HDL: 31.7 mg/dL — ABNORMAL LOW (ref >39.00)
LDL CALC: 100 mg/dL — AB (ref 0–99)
NONHDL: 119.72
Triglycerides: 98 mg/dL (ref 0.0–149.0)
VLDL: 19.6 mg/dL (ref 0.0–40.0)

## 2015-06-07 LAB — VITAMIN D 25 HYDROXY (VIT D DEFICIENCY, FRACTURES): VITD: 44.34 ng/mL (ref 30.00–100.00)

## 2015-06-07 LAB — COMPREHENSIVE METABOLIC PANEL
ALK PHOS: 74 U/L (ref 39–117)
ALT: 48 U/L (ref 0–53)
AST: 37 U/L (ref 0–37)
Albumin: 4 g/dL (ref 3.5–5.2)
BILIRUBIN TOTAL: 0.6 mg/dL (ref 0.2–1.2)
BUN: 15 mg/dL (ref 6–23)
CO2: 28 meq/L (ref 19–32)
CREATININE: 1.31 mg/dL (ref 0.40–1.50)
Calcium: 9.7 mg/dL (ref 8.4–10.5)
Chloride: 105 mEq/L (ref 96–112)
GFR: 57.48 mL/min — ABNORMAL LOW (ref >60.00)
GLUCOSE: 107 mg/dL — AB (ref 70–99)
Potassium: 4.2 mEq/L (ref 3.5–5.1)
Sodium: 139 mEq/L (ref 135–145)
TOTAL PROTEIN: 6.6 g/dL (ref 6.0–8.3)

## 2015-06-07 LAB — URIC ACID: Uric Acid, Serum: 6.7 mg/dL (ref 4.0–7.8)

## 2015-06-07 LAB — TSH: TSH: 4.31 u[IU]/mL (ref 0.35–4.50)

## 2015-06-07 LAB — MAGNESIUM: MAGNESIUM: 2 mg/dL (ref 1.5–2.5)

## 2015-06-07 LAB — HEMOGLOBIN A1C: HEMOGLOBIN A1C: 6 % (ref 4.6–6.5)

## 2015-06-07 LAB — C-REACTIVE PROTEIN: CRP: 0.3 mg/dL — AB (ref 0.5–20.0)

## 2015-06-07 LAB — T4, FREE: Free T4: 0.71 ng/dL (ref 0.60–1.60)

## 2015-06-07 MED ORDER — LOSARTAN POTASSIUM 50 MG PO TABS: 50.0000 mg | ORAL_TABLET | Freq: Every day | ORAL | Status: DC

## 2015-06-07 MED ORDER — CYCLOBENZAPRINE HCL 5 MG PO TABS: 5.0000 mg | ORAL_TABLET | Freq: Three times a day (TID) | ORAL | Status: DC | PRN

## 2015-06-07 MED ORDER — HYDROCORTISONE 2.5 % RE CREA: 1.0000 "application " | TOPICAL_CREAM | RECTAL | Status: DC | PRN

## 2015-06-07 NOTE — Patient Instructions (Signed)
Fatty Liver  Fatty liver, also called hepatic steatosis or steatohepatitis, is a condition in which too much fat has built up in your liver cells. The liver removes harmful substances from your bloodstream. It produces fluids your body needs. It also helps your body use and store energy from the food you eat.  In many cases, fatty liver does not cause symptoms or problems. It is often diagnosed when tests are being done for other reasons. However, over time, fatty liver can cause inflammation that may lead to more serious liver problems, such as scarring of the liver (cirrhosis).  CAUSES   Causes of fatty liver may include:    Drinking too much alcohol.   Poor nutrition.   Obesity.   Cushing syndrome.   Diabetes.   Hyperlipidemia.   Pregnancy.   Certain drugs.   Poisons.   Some viral infections.  RISK FACTORS  You may be more likely to develop fatty liver if you:   Abuse alcohol.   Are pregnant.   Are overweight.   Have diabetes.   Have hepatitis.   Have a high triglyceride level.   SIGNS AND SYMPTOMS   Fatty liver often does not cause any symptoms. In cases where symptoms develop, they can include:   Fatigue.   Weakness.   Weight loss.   Confusion.    Abdominal pain.   Yellowing of your skin and the white parts of your eyes (jaundice).   Nausea and vomiting.  DIAGNOSIS   Fatty liver may be diagnosed by:    Physical exam and medical history.   Blood tests.   Imaging tests, such as an ultrasound, CT scan, or MRI.   Liver biopsy. A small sample of liver tissue is removed using a needle. The sample is then looked at under a microscope.  TREATMENT   Fatty liver is often caused by other health conditions. Treatment for fatty liver may involve medicines and lifestyle changes to manage conditions such as:    Alcoholism.   High cholesterol.   Diabetes.   Being overweight or obese.   HOME CARE INSTRUCTIONS   Eat a healthy diet as directed by your health care provider.   Exercise regularly.  This can help you lose weight and control your cholesterol and diabetes. Talk to your health care provider about an exercise plan and which activities are best for you.   Do not drink alcohol.    Take medicines only as directed by your health care provider.  SEEK MEDICAL CARE IF:  You have difficulty controlling your:   Blood sugar.   Cholesterol.   Alcohol consumption.  SEEK IMMEDIATE MEDICAL CARE IF:   You have abdominal pain.   You have jaundice.   You have nausea and vomiting.     This information is not intended to replace advice given to you by your health care provider. Make sure you discuss any questions you have with your health care provider.     Document Released: 02/13/2005 Document Revised: 01/19/2014 Document Reviewed: 05/10/2013  Elsevier Interactive Patient Education 2016 Else[employer] Inc[employer]

## 2015-06-07 NOTE — Progress Notes (Signed)
Pre visit review using our clinic review tool, if applicable. No additional management support is needed unless otherwise documented below in the visit note. 

## 2015-06-07 NOTE — Assessment & Plan Note (Signed)
Now with a couple months of RUQ pain, responsive to topical treatments to some degree

## 2015-06-10 ENCOUNTER — Other Ambulatory Visit: Payer: Self-pay | Admitting: Family Medicine

## 2015-06-10 DIAGNOSIS — M549 Dorsalgia, unspecified: Secondary | ICD-10-CM

## 2015-06-11 ENCOUNTER — Telehealth: Payer: Self-pay | Admitting: *Deleted

## 2015-06-11 NOTE — Telephone Encounter (Signed)
PA approved effective from 06/11/2015 through 06/10/2016. JG//CMA

## 2015-06-11 NOTE — Telephone Encounter (Signed)
Caller name: Almyra Free with [facility] PA Can be reached: [phone]  Reason for call: approved for cyclobenzaprine 5mg  tab, letter will go out to pt and provider, 06/11/15-06/10/16

## 2015-06-11 NOTE — Telephone Encounter (Signed)
PA submitted to [facility] Medicare Part D on covermymeds.com. Awaiting determination. JG//CMA   Alternatives given by covermymeds include baclofen and tizanidine.

## 2015-06-16 DIAGNOSIS — Z8679 Personal history of other diseases of the circulatory system: Secondary | ICD-10-CM | POA: Insufficient documentation

## 2015-06-16 DIAGNOSIS — N281 Cyst of kidney, acquired: Secondary | ICD-10-CM | POA: Insufficient documentation

## 2015-06-16 NOTE — Assessment & Plan Note (Signed)
Poorly controlled will alter medications, encouraged DASH diet, minimize caffeine and obtain adequate sleep. Report concerning symptoms and follow up as directed and as needed. Increase Losartan to 50 mg daily

## 2015-06-16 NOTE — Progress Notes (Signed)
Patient ID: David [name], male   DOB: [birth year], 70 y.o.   MRN: [id number]   Subjective:    Patient ID: David [name], male    DOB: [birth year], 70 y.o.   MRN: [id number]  Chief Complaint  Patient presents with  . Follow-up    HPI Patient is in today for follow up. Continues to struggle with chronic pain and is requesting a refill on his medications. No recent fever but is noting increased headaches and had a BP of 185/104 recently per patient. Notes increased dyspepsia, abdominal pain and choking recently. Denies CP/palp/SOBcongestion/ or GU c/o. Taking meds as prescribed  Past Medical History  Diagnosis Date  . LAFB (left anterior fascicular block)   . Diverticulitis   . Hypertension   . Premature ventricular complex   . Atrial fibrillation ([place])   . Hyperlipidemia   . Gout   . Anxiety   . Back pain   . Internal hemorrhoids   . GERD (gastroesophageal reflux disease)   . Diverticulosis   . BCC (basal cell carcinoma of skin)     under right eye and right ear  . Rectal irritation 09/18/2012  . Other malaise and fatigue 03/13/2013  . Tinea corporis 03/13/2013  . Abnormal liver function 03/18/2013  . IBS (irritable bowel syndrome) 03/18/2013  . Unspecified constipation 09/25/2013  . Medicare annual wellness visit, subsequent 03/12/2014  . Preventative health care 03/07/2015  . Obesity 11/25/2007    Qualifier: Diagnosis of  By: Lenna Gilford MD, Deborra Medina     Past Surgical History  Procedure Laterality Date  . Nose surgery    . Colonoscopy    . Colon surgery  2009    segmental sigmoid resection  . Skin biopsy      Family History  Problem Relation Age of Onset  . Heart disease Mother   . Cancer Mother     colon, breast  . Hypertension Mother   . Stroke Mother   . Heart disease Father     pacemaker  . Aortic aneurysm Father   . Hypertension Father   . Heart disease Sister   . Atrial fibrillation Sister   . Obesity Sister   . Sleep apnea Sister   . Heart attack Brother   .  Other Brother     muscle disease  . Arthritis Brother   . Stroke Brother   . Cancer Maternal Grandmother     ?  Marland Kitchen Heart attack Maternal Grandmother   . Diabetes Maternal Grandmother   . Cancer Maternal Grandfather     hodgin's lymphoma  . Heart attack Paternal Grandmother   . Anxiety disorder Paternal Grandmother   . Pneumonia Paternal Grandfather   . Heart attack Brother   . Atrial fibrillation Brother   . Heart attack Brother   . Hypertension Brother   . Hyperlipidemia Brother   . Heart attack Brother   . Other Brother     heart valve operation    Social History   Social History  . Marital Status: Married    Spouse Name: N/A  . Number of Children: 3  . Years of Education: N/A   Occupational History  . [employer]    Social History Main Topics  . Smoking status: Former Smoker -- 1.00 packs/day for 6 years    Types: Cigarettes    Start date: 01/12/1969  . Smokeless tobacco: Never Used  . Alcohol Use: Yes     Comment: 2 drinks every night  . Drug Use: No  .  Sexual Activity: Yes   Other Topics Concern  . Not on file   Social History Narrative    Outpatient Prescriptions Prior to Visit  Medication Sig Dispense Refill  . alum hydroxide-mag trisilicate (GAVISCON) 06-26 MG CHEW Chew by mouth.    Marland Kitchen aspirin 81 MG tablet Take 81 mg by mouth daily as needed.     Marland Kitchen atenolol (TENORMIN) 25 MG tablet TAKE ONE TABLET (25 MG) BY MOUTH TWO TIMES DAILY. 60 tablet 3  . atorvastatin (LIPITOR) 40 MG tablet Take 20 mg by mouth 2 (two) times daily.     . fluticasone (FLONASE) 50 MCG/ACT nasal spray Place 2 sprays into the nose daily as needed. For congestion    . LORazepam (ATIVAN) 1 MG tablet TAKE 1 TABLET BY MOUTH EVERY 8 HOURS AS NEEDED FOR ANXIETY OR SLEEP 90 tablet 3  . meloxicam (MOBIC) 15 MG tablet PRN As directed    . omeprazole (PRILOSEC) 20 MG capsule TAKE ONE CAPSULE BY MOUTH EVERY DAY 30 capsule 3  . omeprazole (PRILOSEC) 20 MG capsule TAKE ONE CAPSULE BY MOUTH EVERY DAY 30  capsule 3  . sertraline (ZOLOFT) 100 MG tablet Take 1 tablet (100 mg total) by mouth daily. 60 tablet 2  . zoster vaccine live, PF, (ZOSTAVAX) 94854 UNT/0.65ML injection Inject 19,400 Units into the skin once. 1 each 0  . cyclobenzaprine (FLEXERIL) 5 MG tablet Take 1 tablet (5 mg total) by mouth 3 (three) times daily as needed for muscle spasms. 30 tablet 1  . losartan (COZAAR) 25 MG tablet TAKE ONE TABLET (25 MG) BY MOUTH DAILY. 30 tablet 5  . PROCTOZONE-HC 2.5 % rectal cream Place 1 application rectally as needed.      No facility-administered medications prior to visit.    Allergies  Allergen Reactions  . Cefaclor Hives  . Cephalosporins Hives  . Penicillins Rash    Mild maculopapular rash    Review of Systems  Constitutional: Negative for fever and malaise/fatigue.  HENT: Negative for congestion.   Eyes: Negative for blurred vision.  Respiratory: Negative for shortness of breath.   Cardiovascular: Negative for chest pain, palpitations and leg swelling.  Gastrointestinal: Positive for heartburn and abdominal pain. Negative for nausea and blood in stool.  Genitourinary: Negative for dysuria and frequency.  Musculoskeletal: Negative for falls.  Skin: Negative for rash.  Neurological: Positive for headaches. Negative for dizziness and loss of consciousness.  Endo/Heme/Allergies: Negative for environmental allergies.  Psychiatric/Behavioral: Negative for depression. The patient is not nervous/anxious.        Objective:    Physical Exam  Constitutional: He is oriented to person, place, and time. He appears well-developed and well-nourished. No distress.  HENT:  Head: Normocephalic and atraumatic.  Nose: Nose normal.  Eyes: Right eye exhibits no discharge. Left eye exhibits no discharge.  Neck: Normal range of motion. Neck supple.  Cardiovascular: Normal rate and regular rhythm.   No murmur heard. Pulmonary/Chest: Effort normal and breath sounds normal.  Abdominal: Soft.  Bowel sounds are normal. There is no tenderness.  Musculoskeletal: He exhibits no edema.  Neurological: He is alert and oriented to person, place, and time.  Skin: Skin is warm and dry.  Psychiatric: He has a normal mood and affect.  Nursing note and vitals reviewed.   BP 140/90 mmHg  Pulse 66  Temp(Src) 98.2 F (36.8 C) (Oral)  Ht 6' (1.829 m)  Wt 274 lb 2 oz (124.342 kg)  BMI 37.17 kg/m2  SpO2 93% Wt Readings from Last  3 Encounters:  06/07/15 274 lb 2 oz (124.342 kg)  05/09/15 274 lb (124.286 kg)  05/02/15 279 lb (126.554 kg)     Lab Results  Component Value Date   WBC 6.7 06/07/2015   HGB 14.5 06/07/2015   HCT 43.4 06/07/2015   PLT 203.0 06/07/2015   GLUCOSE 107* 06/07/2015   CHOL 151 06/07/2015   TRIG 98.0 06/07/2015   HDL 31.70* 06/07/2015   LDLDIRECT 168.5 03/29/2006   LDLCALC 100* 06/07/2015   ALT 48 06/07/2015   AST 37 06/07/2015   NA 139 06/07/2015   K 4.2 06/07/2015   CL 105 06/07/2015   CREATININE 1.31 06/07/2015   BUN 15 06/07/2015   CO2 28 06/07/2015   TSH 4.31 06/07/2015   PSA 1.47 02/23/2014   INR 1.39 11/14/2010   HGBA1C 6.0 06/07/2015    Lab Results  Component Value Date   TSH 4.31 06/07/2015   Lab Results  Component Value Date   WBC 6.7 06/07/2015   HGB 14.5 06/07/2015   HCT 43.4 06/07/2015   MCV 93.7 06/07/2015   PLT 203.0 06/07/2015   Lab Results  Component Value Date   NA 139 06/07/2015   K 4.2 06/07/2015   CO2 28 06/07/2015   GLUCOSE 107* 06/07/2015   BUN 15 06/07/2015   CREATININE 1.31 06/07/2015   BILITOT 0.6 06/07/2015   ALKPHOS 74 06/07/2015   AST 37 06/07/2015   ALT 48 06/07/2015   PROT 6.6 06/07/2015   ALBUMIN 4.0 06/07/2015   CALCIUM 9.7 06/07/2015   ANIONGAP 6 09/27/2014   GFR 57.48* 06/07/2015   Lab Results  Component Value Date   CHOL 151 06/07/2015   Lab Results  Component Value Date   HDL 31.70* 06/07/2015   Lab Results  Component Value Date   LDLCALC 100* 06/07/2015   Lab Results  Component  Value Date   TRIG 98.0 06/07/2015   Lab Results  Component Value Date   CHOLHDL 5 06/07/2015   Lab Results  Component Value Date   HGBA1C 6.0 06/07/2015       Assessment & Plan:   Problem List Items Addressed This Visit    Vitamin D deficiency    Continue daily supplements      Relevant Orders   TSH (Completed)   CBC (Completed)   C-reactive protein (Completed)   Comprehensive metabolic panel (Completed)   T4, free (Completed)   Lipid panel (Completed)   DG Lumbar Spine Complete (Completed)   DG Thoracic Spine W/Swimmers (Completed)   Vitamin D (25 hydroxy) (Completed)   Uric acid (Completed)   Somnolence   Relevant Orders   Split night study   TSH (Completed)   CBC (Completed)   C-reactive protein (Completed)   Comprehensive metabolic panel (Completed)   T4, free (Completed)   Lipid panel (Completed)   DG Lumbar Spine Complete (Completed)   DG Thoracic Spine W/Swimmers (Completed)   Vitamin D (25 hydroxy) (Completed)   Uric acid (Completed)   Snoring - Primary   Relevant Orders   Split night study   TSH (Completed)   CBC (Completed)   C-reactive protein (Completed)   Comprehensive metabolic panel (Completed)   T4, free (Completed)   Lipid panel (Completed)   DG Lumbar Spine Complete (Completed)   DG Thoracic Spine W/Swimmers (Completed)   Vitamin D (25 hydroxy) (Completed)   Uric acid (Completed)   Renal insufficiency   Relevant Orders   TSH (Completed)   CBC (Completed)   C-reactive protein (Completed)  Comprehensive metabolic panel (Completed)   T4, free (Completed)   Lipid panel (Completed)   DG Lumbar Spine Complete (Completed)   DG Thoracic Spine W/Swimmers (Completed)   Vitamin D (25 hydroxy) (Completed)   Uric acid (Completed)   Rectal irritation   Relevant Orders   TSH (Completed)   CBC (Completed)   C-reactive protein (Completed)   Comprehensive metabolic panel (Completed)   T4, free (Completed)   Lipid panel (Completed)   DG  Lumbar Spine Complete (Completed)   DG Thoracic Spine W/Swimmers (Completed)   Vitamin D (25 hydroxy) (Completed)   Uric acid (Completed)   Paroxysmal a-fib (HCC)    RRR today      Relevant Medications   losartan (COZAAR) 50 MG tablet   Other Relevant Orders   Magnesium (Completed)   Obesity    Encouraged DASH diet, decrease po intake and increase exercise as tolerated. Needs 7-8 hours of sleep nightly. Avoid trans fats, eat small, frequent meals every 4-5 hours with lean proteins, complex carbs and healthy fats. Minimize simple carbs      Hyperlipidemia, mixed    Tolerating statin, encouraged heart healthy diet, avoid trans fats, minimize simple carbs and saturated fats. Increase exercise as tolerated      Relevant Medications   losartan (COZAAR) 50 MG tablet   Other Relevant Orders   TSH (Completed)   CBC (Completed)   C-reactive protein (Completed)   Comprehensive metabolic panel (Completed)   T4, free (Completed)   Lipid panel (Completed)   DG Lumbar Spine Complete (Completed)   DG Thoracic Spine W/Swimmers (Completed)   Vitamin D (25 hydroxy) (Completed)   Uric acid (Completed)   Gout   Relevant Orders   TSH (Completed)   CBC (Completed)   C-reactive protein (Completed)   Comprehensive metabolic panel (Completed)   T4, free (Completed)   Lipid panel (Completed)   DG Lumbar Spine Complete (Completed)   DG Thoracic Spine W/Swimmers (Completed)   Vitamin D (25 hydroxy) (Completed)   Uric acid (Completed)   GERD    Avoid offending foods, start probiotics. Do not eat large meals in late evening and consider raising head of bed.       Fatty liver disease, nonalcoholic    Now with a couple months of RUQ pain, responsive to topical treatments to some degree      Relevant Orders   TSH (Completed)   CBC (Completed)   C-reactive protein (Completed)   Comprehensive metabolic panel (Completed)   T4, free (Completed)   Lipid panel (Completed)   DG Lumbar Spine  Complete (Completed)   DG Thoracic Spine W/Swimmers (Completed)   Vitamin D (25 hydroxy) (Completed)   Uric acid (Completed)   US Abdomen Complete (Completed)   Essential hypertension, benign    Poorly controlled will alter medications, encouraged DASH diet, minimize caffeine and obtain adequate sleep. Report concerning symptoms and follow up as directed and as needed. Increase Losartan to 50 mg daily      Relevant Medications   losartan (COZAAR) 50 MG tablet   Constipation   Relevant Orders   TSH (Completed)   CBC (Completed)   C-reactive protein (Completed)   Comprehensive metabolic panel (Completed)   T4, free (Completed)   Lipid panel (Completed)   DG Lumbar Spine Complete (Completed)   DG Thoracic Spine W/Swimmers (Completed)   Vitamin D (25 hydroxy) (Completed)   Uric acid (Completed)   Complex renal cyst    Repeat ultrasound      Relevant Orders  US Abdomen Complete (Completed)   Abnormal liver function   Relevant Orders   TSH (Completed)   CBC (Completed)   C-reactive protein (Completed)   Comprehensive metabolic panel (Completed)   T4, free (Completed)   Lipid panel (Completed)   DG Lumbar Spine Complete (Completed)   DG Thoracic Spine W/Swimmers (Completed)   Vitamin D (25 hydroxy) (Completed)   Uric acid (Completed)   US Abdomen Complete (Completed)    Other Visit Diagnoses    Other headache syndrome        Relevant Medications    cyclobenzaprine (FLEXERIL) 5 MG tablet    Other Relevant Orders    Split night study    TSH (Completed)    CBC (Completed)    C-reactive protein (Completed)    Comprehensive metabolic panel (Completed)    T4, free (Completed)    Lipid panel (Completed)    DG Lumbar Spine Complete (Completed)    DG Thoracic Spine W/Swimmers (Completed)    Vitamin D (25 hydroxy) (Completed)    Uric acid (Completed)    Morbid obesity, unspecified obesity type (HCC)        Relevant Orders    Split night study    TSH (Completed)    CBC  (Completed)    C-reactive protein (Completed)    Comprehensive metabolic panel (Completed)    T4, free (Completed)    Lipid panel (Completed)    DG Lumbar Spine Complete (Completed)    DG Thoracic Spine W/Swimmers (Completed)    Vitamin D (25 hydroxy) (Completed)    Uric acid (Completed)    Essential hypertension        Relevant Medications    losartan (COZAAR) 50 MG tablet    Other Relevant Orders    Comp Met (CMET)       I have discontinued Mr. Ambler losartan. I have also changed his PROCTOZONE-HC to hydrocortisone. Additionally, I am having him start on losartan. Lastly, I am having him maintain his atorvastatin, fluticasone, meloxicam, alum hydroxide-mag trisilicate, aspirin, omeprazole, omeprazole, atenolol, LORazepam, zoster vaccine live (PF), sertraline, and cyclobenzaprine.  Meds ordered this encounter  Medications  . cyclobenzaprine (FLEXERIL) 5 MG tablet    Sig: Take 1 tablet (5 mg total) by mouth 3 (three) times daily as needed for muscle spasms.    Dispense:  30 tablet    Refill:  1  . hydrocortisone (PROCTOZONE-HC) 2.5 % rectal cream    Sig: Place 1 application rectally as needed.    Dispense:  30 g    Refill:  2  . losartan (COZAAR) 50 MG tablet    Sig: Take 1 tablet (50 mg total) by mouth daily.    Dispense:  30 tablet    Refill:  3     Penni Homans, MD

## 2015-06-16 NOTE — Assessment & Plan Note (Signed)
Tolerating statin, encouraged heart healthy diet, avoid trans fats, minimize simple carbs and saturated fats. Increase exercise as tolerated 

## 2015-06-16 NOTE — Assessment & Plan Note (Signed)
Repeat ultrasound ?

## 2015-06-16 NOTE — Assessment & Plan Note (Signed)
Avoid offending foods, start probiotics. Do not eat large meals in late evening and consider raising head of bed.  

## 2015-06-16 NOTE — Assessment & Plan Note (Signed)
Encouraged DASH diet, decrease po intake and increase exercise as tolerated. Needs 7-8 hours of sleep nightly. Avoid trans fats, eat small, frequent meals every 4-5 hours with lean proteins, complex carbs and healthy fats. Minimize simple carbs 

## 2015-06-16 NOTE — Assessment & Plan Note (Signed)
RRR today 

## 2015-06-16 NOTE — Assessment & Plan Note (Signed)
Continue daily supplements 

## 2015-07-01 ENCOUNTER — Ambulatory Visit (INDEPENDENT_AMBULATORY_CARE_PROVIDER_SITE_OTHER): Payer: Medicare Other | Admitting: Family Medicine

## 2015-07-01 ENCOUNTER — Encounter: Payer: Self-pay | Admitting: Family Medicine

## 2015-07-01 VITALS — BP 116/69 | HR 70 | Temp 98.5°F | Ht 72.75 in | Wt 273.0 lb

## 2015-07-01 DIAGNOSIS — J020 Streptococcal pharyngitis: Secondary | ICD-10-CM | POA: Diagnosis not present

## 2015-07-01 DIAGNOSIS — R059 Cough, unspecified: Secondary | ICD-10-CM

## 2015-07-01 DIAGNOSIS — R05 Cough: Secondary | ICD-10-CM | POA: Diagnosis not present

## 2015-07-01 DIAGNOSIS — J40 Bronchitis, not specified as acute or chronic: Secondary | ICD-10-CM

## 2015-07-01 LAB — POCT RAPID STREP A (OFFICE): Rapid Strep A Screen: POSITIVE — AB

## 2015-07-01 MED ORDER — AZITHROMYCIN 250 MG PO TABS
ORAL_TABLET | ORAL | Status: DC
Start: 2015-07-01 — End: 2015-07-05

## 2015-07-01 MED ORDER — BENZONATATE 100 MG PO CAPS: 100.0000 mg | ORAL_CAPSULE | Freq: Three times a day (TID) | ORAL | Status: DC | PRN

## 2015-07-01 NOTE — Progress Notes (Signed)
[place] at [facility] [address] [place], [place] [address] [phone]  Date:  07/01/2015   Name:  David [name]   DOB:  Nov 27, 1945   MRN:  [id number]  PCP:  Penni Homans, MD    Chief Complaint: Sore Throat   History of Present Illness:  WEILAND SERRETTE is a 70 y.o. very pleasant male patient who presents with the following:  Here today with illness. He has noted a ST for the last 5 days-the throat felt very sore and dry.  This has persisted He then developed a cough, and is now coughing up some discolored mucus.  Some of his mucus appeared gray in color  He has noted HA, body aches, fatigue, but has not noted any fever.   No nausea or vomiting, no diarrhea He has tried aspirin, aleve, mucinex OTC  He is allergic to PCN and cephalosporins- last took a zpack in November without any problems  Most recent QTc was 420- this is normal.  He did have a fib about 5 years ago but this has not recurred, he is on a bb still   BP Readings from Last 3 Encounters:  07/01/15 116/69  06/07/15 140/90  05/09/15 132/79      Patient Active Problem List   Diagnosis Date Noted  . Complex renal cyst 06/16/2015  . Paroxysmal a-fib ([place]) 06/16/2015  . Snoring 06/07/2015  . Somnolence 06/07/2015  . Preventative health care 03/07/2015  . Lipoma of axilla 06/04/2014  . Tinea cruris 03/20/2014  . Lichen simplex chronicus 03/20/2014  . Medicare annual wellness visit, subsequent 03/12/2014  . Depression with anxiety 09/25/2013  . Constipation 09/25/2013  . Gouty arthritis of toe of left foot 08/01/2013  . Vitamin D deficiency 06/18/2013  . Skin rash 05/27/2013  . Abnormal liver function 03/18/2013  . IBS (irritable bowel syndrome) 03/18/2013  . Other malaise and fatigue 03/13/2013  . Tinea corporis 03/13/2013  . Gout 12/14/2012  . Diarrhea 12/14/2012  . Renal insufficiency 12/14/2012  . Rectal irritation 09/18/2012  .  Diverticulosis   . BCC (basal cell carcinoma of skin)   . Scoliosis 01/19/2011  . Atrial fibrillation ([place]) 11/27/2010  . Hyperlipidemia, mixed 03/11/2010  . Essential hypertension, benign 03/11/2010  . ALLERGIC RHINITIS 03/11/2010  . Obesity 11/25/2007  . BENIGN NEOPLASM OF ADRENAL GLAND 09/22/2007  . Fatty liver disease, nonalcoholic 0000000  . DEGENERATIVE JOINT DISEASE 09/22/2007  . SPONDYLOSIS, LUMBAR 09/22/2007  . GERD 04/12/2007    Past Medical History  Diagnosis Date  . LAFB (left anterior fascicular block)   . Diverticulitis   . Hypertension   . Premature ventricular complex   . Atrial fibrillation ([place])   . Hyperlipidemia   . Gout   . Anxiety   . Back pain   . Internal hemorrhoids   . GERD (gastroesophageal reflux disease)   . Diverticulosis   . BCC (basal cell carcinoma of skin)     under right eye and right ear  . Rectal irritation 09/18/2012  . Other malaise and fatigue 03/13/2013  . Tinea corporis 03/13/2013  . Abnormal liver function 03/18/2013  . IBS (irritable bowel syndrome) 03/18/2013  . Unspecified constipation 09/25/2013  . Medicare annual wellness visit, subsequent 03/12/2014  . Preventative health care 03/07/2015  . Obesity 11/25/2007    Qualifier: Diagnosis of  By: Lenna Gilford MD, Deborra Medina     Past Surgical History  Procedure Laterality Date  . Nose surgery    .  Colonoscopy    . Colon surgery  2009    segmental sigmoid resection  . Skin biopsy      Social History  Substance Use Topics  . Smoking status: Former Smoker -- 1.00 packs/day for 6 years    Types: Cigarettes    Start date: 01/12/1969  . Smokeless tobacco: Never Used  . Alcohol Use: Yes     Comment: 2 drinks every night    Family History  Problem Relation Age of Onset  . Heart disease Mother   . Cancer Mother     colon, breast  . Hypertension Mother   . Stroke Mother   . Heart disease Father     pacemaker  . Aortic aneurysm Father   . Hypertension Father   . Heart disease Sister    . Atrial fibrillation Sister   . Obesity Sister   . Sleep apnea Sister   . Heart attack Brother   . Other Brother     muscle disease  . Arthritis Brother   . Stroke Brother   . Cancer Maternal Grandmother     ?  Marland Kitchen Heart attack Maternal Grandmother   . Diabetes Maternal Grandmother   . Cancer Maternal Grandfather     hodgin's lymphoma  . Heart attack Paternal Grandmother   . Anxiety disorder Paternal Grandmother   . Pneumonia Paternal Grandfather   . Heart attack Brother   . Atrial fibrillation Brother   . Heart attack Brother   . Hypertension Brother   . Hyperlipidemia Brother   . Heart attack Brother   . Other Brother     heart valve operation    Allergies  Allergen Reactions  . Cefaclor Hives  . Cephalosporins Hives  . Penicillins Rash    Mild maculopapular rash    Medication list has been reviewed and updated.  Current Outpatient Prescriptions on File Prior to Visit  Medication Sig Dispense Refill  . alum hydroxide-mag trisilicate (GAVISCON) [id number] MG CHEW Chew by mouth.    Marland Kitchen aspirin 81 MG tablet Take 81 mg by mouth daily as needed.     Marland Kitchen atenolol (TENORMIN) 25 MG tablet TAKE ONE TABLET (25 MG) BY MOUTH TWO TIMES DAILY. 60 tablet 3  . atorvastatin (LIPITOR) 40 MG tablet Take 20 mg by mouth 2 (two) times daily.     . cyclobenzaprine (FLEXERIL) 5 MG tablet Take 1 tablet (5 mg total) by mouth 3 (three) times daily as needed for muscle spasms. 30 tablet 1  . fluticasone (FLONASE) 50 MCG/ACT nasal spray Place 2 sprays into the nose daily as needed. For congestion    . hydrocortisone (PROCTOZONE-HC) 2.5 % rectal cream Place 1 application rectally as needed. 30 g 2  . LORazepam (ATIVAN) 1 MG tablet TAKE 1 TABLET BY MOUTH EVERY 8 HOURS AS NEEDED FOR ANXIETY OR SLEEP 90 tablet 3  . losartan (COZAAR) 50 MG tablet Take 1 tablet (50 mg total) by mouth daily. 30 tablet 3  . meloxicam (MOBIC) 15 MG tablet PRN As directed    . omeprazole (PRILOSEC) 20 MG capsule TAKE ONE CAPSULE  BY MOUTH EVERY DAY 30 capsule 3  . omeprazole (PRILOSEC) 20 MG capsule TAKE ONE CAPSULE BY MOUTH EVERY DAY 30 capsule 3  . sertraline (ZOLOFT) 100 MG tablet Take 1 tablet (100 mg total) by mouth daily. 60 tablet 2  . zoster vaccine live, PF, (ZOSTAVAX) 16109 UNT/0.65ML injection Inject 19,400 Units into the skin once. 1 each 0   No current facility-administered medications on  file prior to visit.    Review of Systems:  As per HPI- otherwise negative.   Physical Examination: Filed Vitals:   07/01/15 0845  BP: 116/69  Pulse: 70  Temp: 98.5 F (36.9 C)   Filed Vitals:   07/01/15 0845  Height: 6' 0.75" (1.848 m)  Weight: 273 lb (123.832 kg)   Body mass index is 36.26 kg/(m^2). Ideal Body Weight: Weight in (lb) to have BMI = 25: 187.8  GEN: WDWN, NAD, Non-toxic, A & O x 3, overweight, large build.  Appears well but has a mild cough HEENT: Atraumatic, Normocephalic. Neck supple. No masses, No LAD.  Bilateral TM wnl, oropharynx normal.  PEERL,EOMI.   Ears and Nose: No external deformity. CV: RRR, No M/G/R. No JVD. No thrill. No extra heart sounds. PULM: CTA B, no wheezes, crackles, rhonchi. No retractions. No resp. distress. No accessory muscle use. EXTR: No c/c/e NEURO Normal gait.  PSYCH: Normally interactive. Conversant. Not depressed or anxious appearing.  Calm demeanor.   Rapid strep is positive today but line is faint- will send culture as well Assessment and Plan: Streptococcal sore throat - Plan: POCT rapid strep A, Culture, Group A Strep, azithromycin (ZITHROMAX) 250 MG tablet  Bronchitis - Plan: azithromycin (ZITHROMAX) 250 MG tablet  Cough - Plan: benzonatate (TESSALON) 100 MG capsule  Treat for positive strep test with azithromcyin while culture is pending.  Will also cover bronchitis  Tessalon for cough Rest, OTC meds as needed He will let me know if not feeling better soon- .Sooner if worse.     Signed Lamar Blinks, MD

## 2015-07-01 NOTE — Progress Notes (Signed)
Pre visit review using our clinic review tool, if applicable. No additional management support is needed unless otherwise documented below in the visit note. 

## 2015-07-01 NOTE — Addendum Note (Signed)
Addended by: Harl Bowie on: 07/01/2015 09:34 AM   Modules accepted: Miquel Dunn

## 2015-07-01 NOTE — Patient Instructions (Signed)
We are going to treat you for presumed sore throat and bronchitis with azithromycin antibiotic.  Use this as directed and continue sore throat spray, other OTC medications as needed Let me know if you are not feeling better over the next 1-2 days - Sooner if worse.

## 2015-07-03 ENCOUNTER — Other Ambulatory Visit: Payer: Self-pay | Admitting: Family Medicine

## 2015-07-03 LAB — CULTURE, GROUP A STREP: Organism ID, Bacteria: NORMAL

## 2015-07-05 ENCOUNTER — Telehealth: Payer: Self-pay | Admitting: Family Medicine

## 2015-07-05 DIAGNOSIS — J020 Streptococcal pharyngitis: Secondary | ICD-10-CM

## 2015-07-05 DIAGNOSIS — J40 Bronchitis, not specified as acute or chronic: Secondary | ICD-10-CM

## 2015-07-05 MED ORDER — AZITHROMYCIN 250 MG PO TABS: ORAL_TABLET | ORAL | Status: DC

## 2015-07-05 NOTE — Telephone Encounter (Signed)
Can call in a refill on the Azithromycin so if he worsens he can extend the course but remind him that Azithromycin stays in the blood at therapeutic levels for 10 days so he may continue to improve even if he does not take more antibiotics. Make sure he is taking probiotic and plain mucinex twice daily as well

## 2015-07-05 NOTE — Telephone Encounter (Signed)
Sent in antibiotic. Patient informed of PCP instructions.

## 2015-07-05 NOTE — Telephone Encounter (Signed)
Caller name: [place] Relation to pt: self Call back number: [phone] Pharmacy: [facility]  Reason for call: Pt stated was seen on Monday with Dr Lorelei Pont for coughing and chest congestion and was giving meds azithromycin (ZITHROMAX) 250 MG tablet , pt states is feeling a little bit better but still having congestion and coughing,pt mentioned that on his appt was told if he does not get completely well to call the office, pt would like to know if needing another dose of medication so he can get better. Please advise ASAP

## 2015-07-10 ENCOUNTER — Ambulatory Visit (INDEPENDENT_AMBULATORY_CARE_PROVIDER_SITE_OTHER): Payer: Medicare Other | Admitting: Internal Medicine

## 2015-07-10 ENCOUNTER — Encounter: Payer: Self-pay | Admitting: Internal Medicine

## 2015-07-10 ENCOUNTER — Ambulatory Visit (HOSPITAL_BASED_OUTPATIENT_CLINIC_OR_DEPARTMENT_OTHER)
Admission: RE | Admit: 2015-07-10 | Discharge: 2015-07-10 | Disposition: A | Discharge status: Home / Self Care | Payer: Medicare Other | Source: Ambulatory Visit | Attending: Internal Medicine | Admitting: Internal Medicine

## 2015-07-10 ENCOUNTER — Other Ambulatory Visit: Payer: Self-pay

## 2015-07-10 VITALS — BP 122/76 | HR 68 | Temp 98.0°F | Ht 72.75 in | Wt 270.0 lb

## 2015-07-10 DIAGNOSIS — R059 Cough, unspecified: Secondary | ICD-10-CM

## 2015-07-10 DIAGNOSIS — R918 Other nonspecific abnormal finding of lung field: Secondary | ICD-10-CM | POA: Diagnosis not present

## 2015-07-10 DIAGNOSIS — J189 Pneumonia, unspecified organism: Secondary | ICD-10-CM | POA: Diagnosis not present

## 2015-07-10 DIAGNOSIS — R05 Cough: Secondary | ICD-10-CM | POA: Insufficient documentation

## 2015-07-10 MED ORDER — HYDROCODONE-HOMATROPINE 5-1.5 MG/5ML PO SYRP: 5.0000 mL | ORAL_SOLUTION | Freq: Every evening | ORAL | Status: DC | PRN

## 2015-07-10 MED ORDER — BENZONATATE 100 MG PO CAPS: 100.0000 mg | ORAL_CAPSULE | Freq: Three times a day (TID) | ORAL | Status: DC | PRN

## 2015-07-10 NOTE — Patient Instructions (Signed)
  Get your chest x-ray on the first floor ------------------------------ Rest, fluids , tylenol  For cough:  Take Mucinex DM twice a day as needed until better Start the [facility] as needed Use hydrocodone at bedtime :  only if the cough is severe  For nasal congestion: Use OTC Nasocort or Flonase : 2 nasal sprays on each side of the nose in the morning until you feel better   If you are not gradually better in the next 10 days please call the office

## 2015-07-10 NOTE — Progress Notes (Signed)
Subjective:    Patient ID: David [name], male    DOB: [birth year], 70 y.o.   MRN: [id number]  DOS:  07/10/2015 Type of visit - description : Acute visit Interval history:  Was recently seen with sore throat, + strep test, throat culture negative. Status post Z-Pak 2. Sore throat is gone but he is here today because he continue with a very persistent and hard cough. Reports the cough is so hard that he has a headache and back pain. He also pulled a muscle on the right chest. Currently taking Mucinex DM OTC, did not fill a prescription for Tessalon Perles.   Review of Systems  Subjective fever last night but otherwise afebrile and no chills + Postnasal dripping Initially had dark/green sputum, now cough is mostly dry with occasional clear sputum.  Past Medical History  Diagnosis Date  . LAFB (left anterior fascicular block)   . Diverticulitis   . Hypertension   . Premature ventricular complex   . Atrial fibrillation ([place])   . Hyperlipidemia   . Gout   . Anxiety   . Back pain   . Internal hemorrhoids   . GERD (gastroesophageal reflux disease)   . Diverticulosis   . BCC (basal cell carcinoma of skin)     under right eye and right ear  . Rectal irritation 09/18/2012  . Other malaise and fatigue 03/13/2013  . Tinea corporis 03/13/2013  . Abnormal liver function 03/18/2013  . IBS (irritable bowel syndrome) 03/18/2013  . Unspecified constipation 09/25/2013  . Medicare annual wellness visit, subsequent 03/12/2014  . Preventative health care 03/07/2015  . Obesity 11/25/2007    Qualifier: Diagnosis of  By: Lenna Gilford MD, Deborra Medina     Past Surgical History  Procedure Laterality Date  . Nose surgery    . Colonoscopy    . Colon surgery  2009    segmental sigmoid resection  . Skin biopsy      Social History   Social History  . Marital Status: Married    Spouse Name: N/A  . Number of Children: 3  . Years of Education: N/A   Occupational History  . [employer]    Social History Main  Topics  . Smoking status: Former Smoker -- 1.00 packs/day for 6 years    Types: Cigarettes    Start date: 01/12/1969  . Smokeless tobacco: Never Used  . Alcohol Use: Yes     Comment: 2 drinks every night  . Drug Use: No  . Sexual Activity: Yes   Other Topics Concern  . Not on file   Social History Narrative        Medication List       This list is accurate as of: 07/10/15 11:59 PM.  Always use your most recent med list.               alum hydroxide-mag trisilicate [id number] MG Chew chewable tablet  Commonly known as:  GAVISCON  Chew by mouth.     aspirin 81 MG tablet  Take 81 mg by mouth daily as needed.     atenolol 25 MG tablet  Commonly known as:  TENORMIN  TAKE ONE TABLET (25 MG) BY MOUTH TWO TIMES DAILY.     atorvastatin 40 MG tablet  Commonly known as:  LIPITOR  Take 20 mg by mouth 2 (two) times daily.     benzonatate 100 MG capsule  Commonly known as:  TESSALON  Take 1 capsule (100 mg total) by mouth 3 (  three) times daily as needed for cough.     cyclobenzaprine 5 MG tablet  Commonly known as:  FLEXERIL  Take 1 tablet (5 mg total) by mouth 3 (three) times daily as needed for muscle spasms.     fluticasone 50 MCG/ACT nasal spray  Commonly known as:  FLONASE  Place 2 sprays into the nose daily as needed. For congestion     HYDROcodone-homatropine 5-1.5 MG/5ML syrup  Commonly known as:  HYCODAN  Take 5 mLs by mouth at bedtime as needed for cough.     hydrocortisone 2.5 % rectal cream  Commonly known as:  PROCTOZONE-HC  Place 1 application rectally as needed.     LORazepam 1 MG tablet  Commonly known as:  ATIVAN  TAKE 1 TABLET BY MOUTH EVERY 8 HOURS AS NEEDED FOR ANXIETY OR SLEEP     losartan 50 MG tablet  Commonly known as:  COZAAR  Take 1 tablet (50 mg total) by mouth daily.     meloxicam 15 MG tablet  Commonly known as:  MOBIC  PRN As directed     omeprazole 20 MG capsule  Commonly known as:  PRILOSEC  TAKE ONE CAPSULE BY MOUTH EVERY DAY       sertraline 100 MG tablet  Commonly known as:  ZOLOFT  Take 1 tablet (100 mg total) by mouth daily.     vitamin C 1000 MG tablet  Take 1,000 mg by mouth daily.     zoster vaccine live (PF) 19400 UNT/0.65ML injection  Commonly known as:  ZOSTAVAX  Inject 19,400 Units into the skin once.           Objective:   Physical Exam BP 122/76 mmHg  Pulse 68  Temp(Src) 98 F (36.7 C) (Oral)  Ht 6' 0.75" (1.848 m)  Wt 270 lb (122.471 kg)  BMI 35.86 kg/m2  SpO2 96%  General:   Well developed, well nourished . NAD.  HEENT:  Normocephalic . Face symmetric, atraumatic. TMs slightly bulge, no red. Throat symmetric, no red Lungs:  CTA B, question of slightly decreased breath sounds at right base Normal respiratory effort, no intercostal retractions, no accessory muscle use. Heart: RRR,  no murmur.  No pretibial edema bilaterally  Skin: Not pale. Not jaundice Neurologic:  alert & oriented X3.  Speech normal, gait appropriate for age and unassisted Psych--  Cognition and judgment appear intact.  Cooperative with normal attention span and concentration.  Behavior appropriate. No anxious or depressed appearing.      Assessment & Plan:   Cough: Persisting/severe cough after episode of strep throat/URI. Plan: Check a chest x-ray, otherwise treat symptomatically with Mucinex DM, Tessalon Perles and sporadic use of hydrocodone. Also Nasacort. See instructions. If not improving. Addendum: Chest x-ray showed possible infiltrate. He is status post antibiotics, afebrile, infiltrate is probably d/t the lag of "chest x-ray improvement".  Since he is s/p abx and improving  we agreed to continue with present care and repeat a chest x-ray in 3 weeks. Will call if fever or greenish sputum resurface. He verbalized understanding.

## 2015-07-10 NOTE — Progress Notes (Signed)
Pre visit review using our clinic review tool, if applicable. No additional management support is needed unless otherwise documented below in the visit note. 

## 2015-07-12 ENCOUNTER — Ambulatory Visit: Payer: Medicare Other | Admitting: Medical

## 2015-07-15 ENCOUNTER — Ambulatory Visit (INDEPENDENT_AMBULATORY_CARE_PROVIDER_SITE_OTHER): Payer: Medicare Other | Admitting: Medical

## 2015-07-15 ENCOUNTER — Encounter: Payer: Self-pay | Admitting: Medical

## 2015-07-15 ENCOUNTER — Telehealth: Payer: Self-pay | Admitting: Medical

## 2015-07-15 VITALS — BP 123/66 | HR 63 | Temp 98.2°F | Ht 72.75 in | Wt 265.4 lb

## 2015-07-15 DIAGNOSIS — D179 Benign lipomatous neoplasm, unspecified: Secondary | ICD-10-CM | POA: Diagnosis not present

## 2015-07-15 NOTE — Telephone Encounter (Signed)
Please let me know if they require [place] before referral. Can you get him in within 1-2 weeks.

## 2015-07-15 NOTE — Telephone Encounter (Signed)
Will you call surgeon office and explain situation. Will defer to them if pt needs an ultrasound. If they want [place] to order could go ahead and do that.

## 2015-07-15 NOTE — Telephone Encounter (Signed)
Records sent for review.

## 2015-07-15 NOTE — Progress Notes (Signed)
Subjective:    Patient ID: David [name], male    DOB: 06-29-1945, 70 y.o.   MRN: [id number]  HPI  Pt feels like he has lump arm rt axillary area for 3-4 months. He noticed it after some weight loss. He had purposeful weight loss of 15 pounds. Eating less and getting more exercise. The small lump does not hurt. He just states he knows it is their. No fever, no redness, no chills no sweats and no induration.  Review of Systems  Constitutional: Positive for chills. Negative for fever and fatigue.  Respiratory: Negative for chest tightness, shortness of breath and wheezing.   Cardiovascular: Negative for chest pain and palpitations.  Gastrointestinal: Negative for abdominal pain.  Musculoskeletal:       Rx axillary area.  Hematological: Negative for adenopathy. Does not bruise/bleed easily.  Psychiatric/Behavioral: Negative for behavioral problems and confusion.    Past Medical History  Diagnosis Date  . LAFB (left anterior fascicular block)   . Diverticulitis   . Hypertension   . Premature ventricular complex   . Atrial fibrillation ([place])   . Hyperlipidemia   . Gout   . Anxiety   . Back pain   . Internal hemorrhoids   . GERD (gastroesophageal reflux disease)   . Diverticulosis   . BCC (basal cell carcinoma of skin)     under right eye and right ear  . Rectal irritation 09/18/2012  . Other malaise and fatigue 03/13/2013  . Tinea corporis 03/13/2013  . Abnormal liver function 03/18/2013  . IBS (irritable bowel syndrome) 03/18/2013  . Unspecified constipation 09/25/2013  . Medicare annual wellness visit, subsequent 03/12/2014  . Preventative health care 03/07/2015  . Obesity 11/25/2007    Qualifier: Diagnosis of  By: Lenna Gilford MD, Deborra Medina      Social History   Social History  . Marital Status: Married    Spouse Name: N/A  . Number of Children: 3  . Years of Education: N/A   Occupational History  . [employer]    Social History Main Topics  . Smoking status: Former Smoker -- 1.00  packs/day for 6 years    Types: Cigarettes    Start date: 01/12/1969  . Smokeless tobacco: Never Used  . Alcohol Use: Yes     Comment: 2 drinks every night  . Drug Use: No  . Sexual Activity: Yes   Other Topics Concern  . Not on file   Social History Narrative    Past Surgical History  Procedure Laterality Date  . Nose surgery    . Colonoscopy    . Colon surgery  2009    segmental sigmoid resection  . Skin biopsy      Family History  Problem Relation Age of Onset  . Heart disease Mother   . Cancer Mother     colon, breast  . Hypertension Mother   . Stroke Mother   . Heart disease Father     pacemaker  . Aortic aneurysm Father   . Hypertension Father   . Heart disease Sister   . Atrial fibrillation Sister   . Obesity Sister   . Sleep apnea Sister   . Heart attack Brother   . Other Brother     muscle disease  . Arthritis Brother   . Stroke Brother   . Cancer Maternal Grandmother     ?  Marland Kitchen Heart attack Maternal Grandmother   . Diabetes Maternal Grandmother   . Cancer Maternal Grandfather  hodgin's lymphoma  . Heart attack Paternal Grandmother   . Anxiety disorder Paternal Grandmother   . Pneumonia Paternal Grandfather   . Heart attack Brother   . Atrial fibrillation Brother   . Heart attack Brother   . Hypertension Brother   . Hyperlipidemia Brother   . Heart attack Brother   . Other Brother     heart valve operation    Allergies  Allergen Reactions  . Cefaclor Hives  . Cephalosporins Hives  . Penicillins Rash    Mild maculopapular rash    Current Outpatient Prescriptions on File Prior to Visit  Medication Sig Dispense Refill  . alum hydroxide-mag trisilicate (GAVISCON) [id number] MG CHEW Chew by mouth.    . Ascorbic Acid (VITAMIN C) 1000 MG tablet Take 1,000 mg by mouth daily.    Marland Kitchen aspirin 81 MG tablet Take 81 mg by mouth daily as needed.     Marland Kitchen atenolol (TENORMIN) 25 MG tablet TAKE ONE TABLET (25 MG) BY MOUTH TWO TIMES DAILY. 60 tablet 2  .  atorvastatin (LIPITOR) 40 MG tablet Take 20 mg by mouth 2 (two) times daily.     . benzonatate (TESSALON) 100 MG capsule Take 1 capsule (100 mg total) by mouth 3 (three) times daily as needed for cough. 40 capsule 0  . cyclobenzaprine (FLEXERIL) 5 MG tablet Take 1 tablet (5 mg total) by mouth 3 (three) times daily as needed for muscle spasms. 30 tablet 1  . fluticasone (FLONASE) 50 MCG/ACT nasal spray Place 2 sprays into the nose daily as needed. For congestion    . HYDROcodone-homatropine (HYCODAN) 5-1.5 MG/5ML syrup Take 5 mLs by mouth at bedtime as needed for cough. 120 mL 0  . hydrocortisone (PROCTOZONE-HC) 2.5 % rectal cream Place 1 application rectally as needed. 30 g 2  . LORazepam (ATIVAN) 1 MG tablet TAKE 1 TABLET BY MOUTH EVERY 8 HOURS AS NEEDED FOR ANXIETY OR SLEEP 90 tablet 3  . losartan (COZAAR) 50 MG tablet Take 1 tablet (50 mg total) by mouth daily. 30 tablet 3  . meloxicam (MOBIC) 15 MG tablet PRN As directed    . omeprazole (PRILOSEC) 20 MG capsule TAKE ONE CAPSULE BY MOUTH EVERY DAY 30 capsule 3  . sertraline (ZOLOFT) 100 MG tablet Take 1 tablet (100 mg total) by mouth daily. 60 tablet 2  . zoster vaccine live, PF, (ZOSTAVAX) 16109 UNT/0.65ML injection Inject 19,400 Units into the skin once. 1 each 0   No current facility-administered medications on file prior to visit.    BP 123/66 mmHg  Pulse 63  Temp(Src) 98.2 F (36.8 C) (Oral)  Ht 6' 0.75" (1.848 m)  Wt 265 lb 6.4 oz (120.385 kg)  BMI 35.25 kg/m2  SpO2 95%       Objective:   Physical Exam  General- No acute distress. Pleasant patient. Neck- Full range of motion, no jvd Lungs- Clear, even and unlabored. Heart- regular rate and rhythm. Neurologic- CNII- XII grossly intact.  Chest- on exam no obvious thick breast like tissue over pectoralis.   Rx axillary area-diffuse fat present but on palpation appears can palpate borders of moderate to large lipoma.  Lt axillary area- diffuse fat present But no  palpation of lipoma like mass.     Assessment & Plan:  For you very likely lipoma I will first refer you to surgeon office. They will determine if [place] is necessary.   For you very likely lipoma I will first refer you to surgeon office. They will determine if  [place] is necessary.   If [place] necessary I or surgeon will order.  Anderson Malta is the one who does referrals here. If our office does not call you within a week then please call her.  Follow up as regularly scheduled or as needed  Tranise Forrest, Percell Miller, PA-C

## 2015-07-15 NOTE — Patient Instructions (Addendum)
For you very likely lipoma I will first refer you to surgeon office. They will determine if [place] is necessary.   If [place] necessary I or surgeon will order.  Anderson Malta is the one who does referrals here. If our office does not call you within a week then please call her.  Follow up as regularly scheduled or as needed

## 2015-07-15 NOTE — Progress Notes (Signed)
Pre visit review using our clinic review tool, if applicable. No additional management support is needed unless otherwise documented below in the visit note. 

## 2015-07-18 ENCOUNTER — Telehealth: Payer: Self-pay | Admitting: Family Medicine

## 2015-07-18 MED ORDER — CIPROFLOXACIN HCL 500 MG PO TABS: 500.0000 mg | ORAL_TABLET | Freq: Two times a day (BID) | ORAL | Status: DC

## 2015-07-18 NOTE — Telephone Encounter (Signed)
OK to give a prescription for Ciprofloxacin 500 mg po bid x 7 days and take plain Mucinex bid and a probiotic daily. Will need to be seen if no response and seek care if symptoms worsen suddenly

## 2015-07-18 NOTE — Addendum Note (Signed)
Addended by: Sharon Seller B on: 07/18/2015 03:51 PM   Modules accepted: Orders, Medications

## 2015-07-18 NOTE — Telephone Encounter (Signed)
Pt called in because he says that he is still experiencing a bad cough. Pt says that he has seen a few of our providers for this concern. Pt would like to be advise further. Is another appt needed? Could provider just call in something new to pharmacy?    Please advise.   CB: [phone]   Pharmacy: [facility], [place]   Thanks

## 2015-07-18 NOTE — Telephone Encounter (Signed)
Sent in antibiotic to HR at [facility] Patient contacted and informed of PCP instructions.

## 2015-07-29 ENCOUNTER — Encounter (HOSPITAL_BASED_OUTPATIENT_CLINIC_OR_DEPARTMENT_OTHER): Payer: Self-pay

## 2015-07-29 ENCOUNTER — Ambulatory Visit: Payer: Self-pay | Admitting: Surgery

## 2015-08-27 ENCOUNTER — Encounter (HOSPITAL_BASED_OUTPATIENT_CLINIC_OR_DEPARTMENT_OTHER): Payer: Self-pay

## 2015-08-29 ENCOUNTER — Telehealth: Payer: Self-pay | Admitting: Internal Medicine

## 2015-08-29 DIAGNOSIS — J189 Pneumonia, unspecified organism: Secondary | ICD-10-CM

## 2015-08-29 NOTE — Telephone Encounter (Signed)
Due for a follow-up chest x-ray, DX pneumonia. Please arrange

## 2015-08-30 NOTE — Telephone Encounter (Signed)
Pt has Medicare Wellness Visit w/ Hoyle Sauer on 09/05/2015. Will inform her that Pt will need to have follow-up chest x-ray completed before leaving.

## 2015-09-03 ENCOUNTER — Other Ambulatory Visit: Payer: Self-pay | Admitting: *Deleted

## 2015-09-03 NOTE — Telephone Encounter (Signed)
Noted, orders already placed by CMA.

## 2015-09-05 ENCOUNTER — Ambulatory Visit: Payer: Self-pay | Admitting: *Deleted

## 2015-09-06 ENCOUNTER — Ambulatory Visit (INDEPENDENT_AMBULATORY_CARE_PROVIDER_SITE_OTHER): Payer: Medicare Other | Admitting: Medical

## 2015-09-06 ENCOUNTER — Ambulatory Visit: Payer: Self-pay | Admitting: Family Medicine

## 2015-09-06 ENCOUNTER — Ambulatory Visit (HOSPITAL_BASED_OUTPATIENT_CLINIC_OR_DEPARTMENT_OTHER)
Admission: RE | Admit: 2015-09-06 | Discharge: 2015-09-06 | Disposition: A | Discharge status: Home / Self Care | Payer: Medicare Other | Source: Ambulatory Visit | Attending: Internal Medicine | Admitting: Internal Medicine

## 2015-09-06 ENCOUNTER — Encounter: Payer: Self-pay | Admitting: Medical

## 2015-09-06 ENCOUNTER — Encounter (HOSPITAL_BASED_OUTPATIENT_CLINIC_OR_DEPARTMENT_OTHER): Payer: Self-pay

## 2015-09-06 VITALS — HR 64 | Temp 98.3°F | Resp 18 | Ht 72.75 in | Wt 266.2 lb

## 2015-09-06 DIAGNOSIS — J189 Pneumonia, unspecified organism: Secondary | ICD-10-CM | POA: Insufficient documentation

## 2015-09-06 DIAGNOSIS — J029 Acute pharyngitis, unspecified: Secondary | ICD-10-CM

## 2015-09-06 DIAGNOSIS — Z8701 Personal history of pneumonia (recurrent): Secondary | ICD-10-CM

## 2015-09-06 LAB — POCT RAPID STREP A (OFFICE): Rapid Strep A Screen: POSITIVE — AB

## 2015-09-06 MED ORDER — AZITHROMYCIN 250 MG PO TABS: ORAL_TABLET | ORAL | 0 refills | Status: DC

## 2015-09-06 NOTE — Progress Notes (Signed)
Pre visit review using our clinic review tool, if applicable. No additional management support is needed unless otherwise documented below in the visit note. 

## 2015-09-06 NOTE — Progress Notes (Signed)
Subjective:    Patient ID: David [name], male    DOB: November 22, 1945, 70 y.o.   MRN: [id number]  HPI  Pt has st. Pt grandaughter had strep when she visited her grand-dad.  Pt feels fatigue but no bodyaches. No fever, no chills or sweats.  St for 2 days.  Pt has states told to get follow up chest xray to repeat after possible pneumonia at end of June based on xray that I review today. Pt reports no recent chest congestion or cough.   Review of Systems  Constitutional: Positive for fatigue. Negative for chills and fever.  HENT: Positive for sore throat. Negative for congestion, facial swelling, sneezing and trouble swallowing.   Respiratory: Negative for cough, chest tightness, shortness of breath and wheezing.   Cardiovascular: Negative for chest pain and palpitations.  Gastrointestinal: Negative for abdominal pain.  Musculoskeletal: Negative for back pain.  Neurological: Negative for dizziness and headaches.  Hematological: Negative for adenopathy. Does not bruise/bleed easily.  Psychiatric/Behavioral: Negative for behavioral problems and confusion.    Past Medical History:  Diagnosis Date  . Abnormal liver function 03/18/2013  . Anxiety   . Atrial fibrillation ([place])   . Back pain   . BCC (basal cell carcinoma of skin)    under right eye and right ear  . Diverticulitis   . Diverticulosis   . GERD (gastroesophageal reflux disease)   . Gout   . Hyperlipidemia   . Hypertension   . IBS (irritable bowel syndrome) 03/18/2013  . Internal hemorrhoids   . LAFB (left anterior fascicular block)   . Medicare annual wellness visit, subsequent 03/12/2014  . Obesity 11/25/2007   Qualifier: Diagnosis of  By: Lenna Gilford MD, Deborra Medina   . Other malaise and fatigue 03/13/2013  . Premature ventricular complex   . Preventative health care 03/07/2015  . Rectal irritation 09/18/2012  . Tinea corporis 03/13/2013  . Unspecified constipation 09/25/2013     Social History   Social History  . Marital  status: Married    Spouse name: N/A  . Number of children: 3  . Years of education: N/A   Occupational History  . [employer]   Social History Main Topics  . Smoking status: Former Smoker    Packs/day: 1.00    Years: 6.00    Types: Cigarettes    Start date: 01/12/1969  . Smokeless tobacco: Never Used  . Alcohol use Yes     Comment: 2 drinks every night  . Drug use: No  . Sexual activity: Yes   Other Topics Concern  . Not on file   Social History Narrative  . No narrative on file    Past Surgical History:  Procedure Laterality Date  . COLON SURGERY  2009   segmental sigmoid resection  . COLONOSCOPY    . NOSE SURGERY    . SKIN BIOPSY      Family History  Problem Relation Age of Onset  . Heart disease Mother   . Cancer Mother     colon, breast  . Hypertension Mother   . Stroke Mother   . Heart disease Father     pacemaker  . Aortic aneurysm Father   . Hypertension Father   . Heart disease Sister   . Atrial fibrillation Sister   . Obesity Sister   . Sleep apnea Sister   . Heart attack Brother   . Other Brother     muscle disease  . Arthritis Brother   . Stroke Brother   .  Cancer Maternal Grandmother     ?  Marland Kitchen Heart attack Maternal Grandmother   . Diabetes Maternal Grandmother   . Cancer Maternal Grandfather     hodgin's lymphoma  . Heart attack Paternal Grandmother   . Anxiety disorder Paternal Grandmother   . Pneumonia Paternal Grandfather   . Heart attack Brother   . Atrial fibrillation Brother   . Heart attack Brother   . Hypertension Brother   . Hyperlipidemia Brother   . Heart attack Brother   . Other Brother     heart valve operation    Allergies  Allergen Reactions  . Cefaclor Hives  . Cephalosporins Hives  . Penicillins Rash    Mild maculopapular rash    Current Outpatient Prescriptions on File Prior to Visit  Medication Sig Dispense Refill  . alum hydroxide-mag trisilicate (GAVISCON) [id number] MG CHEW Chew by mouth.    . Ascorbic Acid  (VITAMIN C) 1000 MG tablet Take 1,000 mg by mouth daily.    Marland Kitchen aspirin 81 MG tablet Take 81 mg by mouth daily as needed.     Marland Kitchen atenolol (TENORMIN) 25 MG tablet TAKE ONE TABLET (25 MG) BY MOUTH TWO TIMES DAILY. 60 tablet 2  . atorvastatin (LIPITOR) 40 MG tablet Take 20 mg by mouth 2 (two) times daily.     . cyclobenzaprine (FLEXERIL) 5 MG tablet Take 1 tablet (5 mg total) by mouth 3 (three) times daily as needed for muscle spasms. 30 tablet 1  . fluticasone (FLONASE) 50 MCG/ACT nasal spray Place 2 sprays into the nose daily as needed. For congestion    . hydrocortisone (PROCTOZONE-HC) 2.5 % rectal cream Place 1 application rectally as needed. 30 g 2  . LORazepam (ATIVAN) 1 MG tablet TAKE 1 TABLET BY MOUTH EVERY 8 HOURS AS NEEDED FOR ANXIETY OR SLEEP 90 tablet 3  . losartan (COZAAR) 50 MG tablet Take 1 tablet (50 mg total) by mouth daily. 30 tablet 3  . meloxicam (MOBIC) 15 MG tablet PRN As directed    . omeprazole (PRILOSEC) 20 MG capsule TAKE ONE CAPSULE BY MOUTH EVERY DAY 30 capsule 3  . sertraline (ZOLOFT) 100 MG tablet Take 1 tablet (100 mg total) by mouth daily. 60 tablet 2   No current facility-administered medications on file prior to visit.     Pulse 64   Temp 98.3 F (36.8 C) (Oral)   Resp 18   Ht 6' 0.75" (1.848 m)   Wt 266 lb 3.2 oz (120.7 kg)   SpO2 96% Comment: room air  BMI 35.36 kg/m       Objective:   Physical Exam  General  Mental Status - Alert. General Appearance - Well groomed. Not in acute distress.  Skin Rashes- No Rashes.  HEENT Head- Normal. Ear Auditory Canal - Left- Normal. Right - Normal.Tympanic Membrane- Left- Normal. Right- Normal. Eye Sclera/Conjunctiva- Left- Normal. Right- Normal. Nose & Sinuses Nasal Mucosa- Left-  Not  Boggy and Congested. Right-   Not Boggy and  Congested.Bilateral maxillary and frontal sinus pressure. Mouth & Throat Lips: Upper Lip- Normal: no dryness, cracking, pallor, cyanosis, or vesicular eruption. Lower Lip-Normal:  no dryness, cracking, pallor, cyanosis or vesicular eruption. Buccal Mucosa- Bilateral- No Aphthous ulcers. Oropharynx- No Discharge or Erythema. Tonsils: Characteristics- Bilateral-  Bright Erythema and  Congestion. Size/Enlargement- Bilateral- No enlargement. Discharge- bilateral-None.  Neck Neck- Supple. No Masses.   Chest and Lung Exam Auscultation: Breath Sounds:-Clear even and unlabored.  Cardiovascular Auscultation:Rythm- Regular, rate and rhythm. Murmurs & Other Heart  Sounds:Ausculatation of the heart reveal- No Murmurs.  Lymphatic Head & Neck General Head & Neck Lymphatics: Bilateral: Description- No Localized lymphadenopathy.       Assessment & Plan:   Your strep test was positive. I am prescribing azithromycin  antibiotic. Rest hydrate, tylenol for fever and warm salt water gargles. Follow up in 7 days or as needed.  For your hx of pneumonia if appears you were to repeat cxr. So will put cxr order in today.  Rubylee Zamarripa, Percell Miller, PA-C

## 2015-09-06 NOTE — Patient Instructions (Addendum)
Your strep test was positive. I am prescribing azithromycin  antibiotic. Rest hydrate, tylenol for fever and warm salt water gargles. Follow up in 7 days or as needed.  For your hx of pneumonia if appears you were to repeat cxr. So will put cxr order in today.

## 2015-09-09 ENCOUNTER — Ambulatory Visit: Payer: Self-pay | Admitting: Family Medicine

## 2015-09-13 ENCOUNTER — Ambulatory Visit (INDEPENDENT_AMBULATORY_CARE_PROVIDER_SITE_OTHER): Payer: Medicare Other | Admitting: Medical

## 2015-09-13 ENCOUNTER — Encounter: Payer: Self-pay | Admitting: Medical

## 2015-09-13 VITALS — BP 138/86 | HR 64 | Temp 98.4°F | Ht 73.0 in | Wt 264.2 lb

## 2015-09-13 DIAGNOSIS — J020 Streptococcal pharyngitis: Secondary | ICD-10-CM | POA: Diagnosis not present

## 2015-09-13 DIAGNOSIS — Z9289 Personal history of other medical treatment: Secondary | ICD-10-CM

## 2015-09-13 LAB — POCT RAPID STREP A (OFFICE): Rapid Strep A Screen: POSITIVE — AB

## 2015-09-13 MED ORDER — CLINDAMYCIN HCL 150 MG PO CAPS: 150.0000 mg | ORAL_CAPSULE | Freq: Three times a day (TID) | ORAL | 0 refills | Status: DC

## 2015-09-13 NOTE — Progress Notes (Signed)
Pre visit review using our clinic tool,if applicable. No additional management support is needed unless otherwise documented below in the visit note.  

## 2015-09-13 NOTE — Progress Notes (Signed)
Subjective:    Patient ID: David [name], male    DOB: 1945-09-20, 70 y.o.   MRN: [id number]  HPI    Pt in for follow up.  No fever, no chills, no unusual body aches(he states always has some). Just reports his throat is dry. Pt swallowing with no pain. Pt was curious to see if test was negative.   Pt grandaughter had strep but clinically she is better after antibiotic.    Review of Systems  Constitutional: Negative for chills, fatigue and fever.  HENT: Negative for dental problem and drooling.        Only dry throat.  Respiratory: Negative for cough and chest tightness.   Cardiovascular: Negative for chest pain and palpitations.  Gastrointestinal: Negative for abdominal pain.  Skin: Negative for rash.  Neurological: Negative for dizziness and headaches.  Hematological: Negative for adenopathy. Does not bruise/bleed easily.  Psychiatric/Behavioral: Negative for behavioral problems and confusion.    Past Medical History:  Diagnosis Date  . Abnormal liver function 03/18/2013  . Anxiety   . Atrial fibrillation ([place])   . Back pain   . BCC (basal cell carcinoma of skin)    under right eye and right ear  . Diverticulitis   . Diverticulosis   . GERD (gastroesophageal reflux disease)   . Gout   . Hyperlipidemia   . Hypertension   . IBS (irritable bowel syndrome) 03/18/2013  . Internal hemorrhoids   . LAFB (left anterior fascicular block)   . Medicare annual wellness visit, subsequent 03/12/2014  . Obesity 11/25/2007   Qualifier: Diagnosis of  By: Lenna Gilford MD, Deborra Medina   . Other malaise and fatigue 03/13/2013  . Premature ventricular complex   . Preventative health care 03/07/2015  . Rectal irritation 09/18/2012  . Tinea corporis 03/13/2013  . Unspecified constipation 09/25/2013     Social History   Social History  . Marital status: Married    Spouse name: N/A  . Number of children: 3  . Years of education: N/A   Occupational History  . [employer]   Social History Main  Topics  . Smoking status: Former Smoker    Packs/day: 1.00    Years: 6.00    Types: Cigarettes    Start date: 01/12/1969  . Smokeless tobacco: Never Used  . Alcohol use Yes     Comment: 2 drinks every night  . Drug use: No  . Sexual activity: Yes   Other Topics Concern  . Not on file   Social History Narrative  . No narrative on file    Past Surgical History:  Procedure Laterality Date  . COLON SURGERY  2009   segmental sigmoid resection  . COLONOSCOPY    . NOSE SURGERY    . SKIN BIOPSY      Family History  Problem Relation Age of Onset  . Heart disease Mother   . Cancer Mother     colon, breast  . Hypertension Mother   . Stroke Mother   . Heart disease Father     pacemaker  . Aortic aneurysm Father   . Hypertension Father   . Heart disease Sister   . Atrial fibrillation Sister   . Obesity Sister   . Sleep apnea Sister   . Heart attack Brother   . Other Brother     muscle disease  . Arthritis Brother   . Stroke Brother   . Cancer Maternal Grandmother     ?  Marland Kitchen Heart attack Maternal  Grandmother   . Diabetes Maternal Grandmother   . Cancer Maternal Grandfather     hodgin's lymphoma  . Heart attack Paternal Grandmother   . Anxiety disorder Paternal Grandmother   . Pneumonia Paternal Grandfather   . Heart attack Brother   . Atrial fibrillation Brother   . Heart attack Brother   . Hypertension Brother   . Hyperlipidemia Brother   . Heart attack Brother   . Other Brother     heart valve operation    Allergies  Allergen Reactions  . Cefaclor Hives  . Cephalosporins Hives  . Penicillins Rash    Mild maculopapular rash    Current Outpatient Prescriptions on File Prior to Visit  Medication Sig Dispense Refill  . alum hydroxide-mag trisilicate (GAVISCON) [id number] MG CHEW Chew by mouth.    . Ascorbic Acid (VITAMIN C) 1000 MG tablet Take 1,000 mg by mouth daily.    Marland Kitchen aspirin 81 MG tablet Take 81 mg by mouth daily as needed.     Marland Kitchen atenolol (TENORMIN) 25 MG  tablet TAKE ONE TABLET (25 MG) BY MOUTH TWO TIMES DAILY. 60 tablet 2  . atorvastatin (LIPITOR) 40 MG tablet Take 20 mg by mouth 2 (two) times daily.     . cyclobenzaprine (FLEXERIL) 5 MG tablet Take 1 tablet (5 mg total) by mouth 3 (three) times daily as needed for muscle spasms. 30 tablet 1  . fluticasone (FLONASE) 50 MCG/ACT nasal spray Place 2 sprays into the nose daily as needed. For congestion    . hydrocortisone (PROCTOZONE-HC) 2.5 % rectal cream Place 1 application rectally as needed. 30 g 2  . LORazepam (ATIVAN) 1 MG tablet TAKE 1 TABLET BY MOUTH EVERY 8 HOURS AS NEEDED FOR ANXIETY OR SLEEP 90 tablet 3  . losartan (COZAAR) 50 MG tablet Take 1 tablet (50 mg total) by mouth daily. 30 tablet 3  . meloxicam (MOBIC) 15 MG tablet PRN As directed    . omeprazole (PRILOSEC) 20 MG capsule TAKE ONE CAPSULE BY MOUTH EVERY DAY 30 capsule 3  . sertraline (ZOLOFT) 100 MG tablet Take 1 tablet (100 mg total) by mouth daily. 60 tablet 2   No current facility-administered medications on file prior to visit.     BP 138/86   Pulse 64   Temp 98.4 F (36.9 C) (Oral)   Ht 6\' 1"  (1.854 m)   Wt 264 lb 3.2 oz (119.8 kg)   SpO2 95%   BMI 34.86 kg/m       Objective:   Physical Exam  General  Mental Status - Alert. General Appearance - Well groomed. Not in acute distress.  Skin Rashes- No Rashes.  HEENT Head- Normal. Ear Auditory Canal - Left- Normal. Right - Normal.Tympanic Membrane- Left- Normal. Right- Normal. Eye Sclera/Conjunctiva- Left- Normal. Right- Normal.  Sinus- no pressure/pain.  Mouth & Throat Lips: Upper Lip- Normal: no dryness, cracking, pallor, cyanosis, or vesicular eruption. Lower Lip-Normal: no dryness, cracking, pallor, cyanosis or vesicular eruption. Buccal Mucosa- Bilateral- No Aphthous ulcers. Oropharynx- No Discharge or Erythema. Tonsils: Characteristics- Bilateral- No Erythema or Congestion. Size/Enlargement- Bilateral- No enlargement. Discharge-  bilateral-None.  Neck Neck- Supple. No Masses.   Chest and Lung Exam Auscultation: Breath Sounds:-Clear even and unlabored.  Cardiovascular Auscultation:Rythm- Regular, rate and rhythm. Murmurs & Other Heart Sounds:Ausculatation of the heart reveal- No Murmurs.  Lymphatic Head & Neck General Head & Neck Lymphatics: Bilateral: Description- No Localized lymphadenopathy.       Assessment & Plan:   For your hx of  strep and still positive strep despite treatment with strep will rx clindamycin.  I want you to take probiotics while on clindamycin.  We can do rapid strep again in 7 days(pt will go ahead and schedule appointment). If any worsening or changing signs and symptoms after hours then ED evaluation.  Discussed case with Dr. Charlett Blake.   David [name], David Miller, PA-C

## 2015-09-13 NOTE — Patient Instructions (Addendum)
For your hx of strep and still positive strep despite treatment with strep will rx clindamycin.  I want you to take probiotics while on clindamycin.  We can do rapid strep again in 7 days(pt will go ahead and schedule appointment). If any worsening or changing signs and symptoms after hours then ED evaluation.

## 2015-09-20 ENCOUNTER — Ambulatory Visit (INDEPENDENT_AMBULATORY_CARE_PROVIDER_SITE_OTHER): Payer: Medicare Other | Admitting: Medical

## 2015-09-20 VITALS — HR 60 | Ht 73.0 in | Wt 264.0 lb

## 2015-09-20 DIAGNOSIS — J392 Other diseases of pharynx: Secondary | ICD-10-CM

## 2015-09-20 DIAGNOSIS — Z22338 Carrier of other streptococcus: Secondary | ICD-10-CM

## 2015-09-20 DIAGNOSIS — J029 Acute pharyngitis, unspecified: Secondary | ICD-10-CM

## 2015-09-20 LAB — POCT RAPID STREP A (OFFICE): Rapid Strep A Screen: POSITIVE — AB

## 2015-09-20 NOTE — Progress Notes (Signed)
Subjective:    Patient ID: David [name], male    DOB: Jan 06, 1946, 70 y.o.   MRN: [id number]  HPI   Pt in states only mild dry. No fever, no chills, no sweats, and no body aches. He is essentially here for recheck of strep test that was positive despite being on azithromycin. And I rx'd clindamycin since was + after zpack.      Review of Systems  Constitutional: Negative for chills, fatigue and fever.  HENT:       Only dry throat appeareance.  Respiratory: Negative for cough, chest tightness, shortness of breath and wheezing.   Cardiovascular: Negative for chest pain and palpitations.  Gastrointestinal: Negative for abdominal pain, constipation and diarrhea.       Faint indigestion.  Musculoskeletal: Negative for back pain.  Skin: Negative for rash.  Neurological: Negative for dizziness and headaches.  Psychiatric/Behavioral: Negative for agitation, behavioral problems and dysphoric mood. The patient is not nervous/anxious.     Past Medical History:  Diagnosis Date  . Abnormal liver function 03/18/2013  . Anxiety   . Atrial fibrillation ([place])   . Back pain   . BCC (basal cell carcinoma of skin)    under right eye and right ear  . Diverticulitis   . Diverticulosis   . GERD (gastroesophageal reflux disease)   . Gout   . Hyperlipidemia   . Hypertension   . IBS (irritable bowel syndrome) 03/18/2013  . Internal hemorrhoids   . LAFB (left anterior fascicular block)   . Medicare annual wellness visit, subsequent 03/12/2014  . Obesity 11/25/2007   Qualifier: Diagnosis of  By: Lenna Gilford MD, Deborra Medina   . Other malaise and fatigue 03/13/2013  . Premature ventricular complex   . Preventative health care 03/07/2015  . Rectal irritation 09/18/2012  . Tinea corporis 03/13/2013  . Unspecified constipation 09/25/2013     Social History   Social History  . Marital status: Married    Spouse name: N/A  . Number of children: 3  . Years of education: N/A   Occupational History  . [employer]    Social History Main Topics  . Smoking status: Former Smoker    Packs/day: 1.00    Years: 6.00    Types: Cigarettes    Start date: 01/12/1969  . Smokeless tobacco: Never Used  . Alcohol use Yes     Comment: 2 drinks every night  . Drug use: No  . Sexual activity: Yes   Other Topics Concern  . Not on file   Social History Narrative  . No narrative on file    Past Surgical History:  Procedure Laterality Date  . COLON SURGERY  2009   segmental sigmoid resection  . COLONOSCOPY    . NOSE SURGERY    . SKIN BIOPSY      Family History  Problem Relation Age of Onset  . Heart disease Mother   . Cancer Mother     colon, breast  . Hypertension Mother   . Stroke Mother   . Heart disease Father     pacemaker  . Aortic aneurysm Father   . Hypertension Father   . Heart disease Sister   . Atrial fibrillation Sister   . Obesity Sister   . Sleep apnea Sister   . Heart attack Brother   . Other Brother     muscle disease  . Arthritis Brother   . Stroke Brother   . Cancer Maternal Grandmother     ?  Marland Kitchen  Heart attack Maternal Grandmother   . Diabetes Maternal Grandmother   . Cancer Maternal Grandfather     hodgin's lymphoma  . Heart attack Paternal Grandmother   . Anxiety disorder Paternal Grandmother   . Pneumonia Paternal Grandfather   . Heart attack Brother   . Atrial fibrillation Brother   . Heart attack Brother   . Hypertension Brother   . Hyperlipidemia Brother   . Heart attack Brother   . Other Brother     heart valve operation    Allergies  Allergen Reactions  . Cefaclor Hives  . Cephalosporins Hives  . Penicillins Rash    Mild maculopapular rash    Current Outpatient Prescriptions on File Prior to Visit  Medication Sig Dispense Refill  . alum hydroxide-mag trisilicate (GAVISCON) [id number] MG CHEW Chew by mouth.    . Ascorbic Acid (VITAMIN C) 1000 MG tablet Take 1,000 mg by mouth daily.    Marland Kitchen aspirin 81 MG tablet Take 81 mg by mouth daily as needed.     Marland Kitchen  atenolol (TENORMIN) 25 MG tablet TAKE ONE TABLET (25 MG) BY MOUTH TWO TIMES DAILY. 60 tablet 2  . atorvastatin (LIPITOR) 40 MG tablet Take 20 mg by mouth 2 (two) times daily.     . clindamycin (CLEOCIN) 150 MG capsule Take 1 capsule (150 mg total) by mouth 3 (three) times daily. 21 capsule 0  . cyclobenzaprine (FLEXERIL) 5 MG tablet Take 1 tablet (5 mg total) by mouth 3 (three) times daily as needed for muscle spasms. 30 tablet 1  . fluticasone (FLONASE) 50 MCG/ACT nasal spray Place 2 sprays into the nose daily as needed. For congestion    . hydrocortisone (PROCTOZONE-HC) 2.5 % rectal cream Place 1 application rectally as needed. 30 g 2  . LORazepam (ATIVAN) 1 MG tablet TAKE 1 TABLET BY MOUTH EVERY 8 HOURS AS NEEDED FOR ANXIETY OR SLEEP 90 tablet 3  . losartan (COZAAR) 50 MG tablet Take 1 tablet (50 mg total) by mouth daily. 30 tablet 3  . meloxicam (MOBIC) 15 MG tablet PRN As directed    . omeprazole (PRILOSEC) 20 MG capsule TAKE ONE CAPSULE BY MOUTH EVERY DAY 30 capsule 3  . sertraline (ZOLOFT) 100 MG tablet Take 1 tablet (100 mg total) by mouth daily. 60 tablet 2   No current facility-administered medications on file prior to visit.     Pulse 60   Ht 6\' 1"  (1.854 m)   Wt 264 lb (119.7 kg)   SpO2 97%   BMI 34.83 kg/m       Objective:   Physical Exam  General- No acute distress, pleasant pt.  Neck- from, No nuccal rigidity, No submandibular node  hypertrophy.  Lungs- Clear even and unlabored.  Heart- Regular, rate and rhythm. HEENT- Head- normocephalic Eyes- PEERL bilaterally. Ears- Canals clear, normal tm's bilaterally. Nose- No frontal or maxillary sinus tenderness to palpation. Turbinates normal. Throat- posterior pharynx shows no  tonsillar hypertrophy,  Less erythma,  No  discharge.   Neurologic- CN III- XII grossly intact.        Assessment & Plan:  I talked with Dr Charlett Blake and she thinks in your case you are a chronic carrier and since minimal symptoms(abscence  of fever, chills, bodyaches, myalgias and severe type sore throat that further treatment not indicated.)  Follow up as needed/as regularly scheduled with pcp.  If you want opinion from ID we could set that up but I do think they will re-affirm Dr. Charlett Blake advisement.

## 2015-09-20 NOTE — Patient Instructions (Addendum)
I talked with Dr Charlett Blake and she thinks in your case you are a chronic carrier and since minimal symptoms(abscence of fever, chills, bodyaches, myalgias and severe type sore throat that further treatment not indicated.)  Follow up as needed/as regularly scheduled with pcp.  If you want opinion from ID we could set that up but I do think they will re-affirm Dr. Charlett Blake advisement.(I discussed with her on day of service)

## 2015-09-23 ENCOUNTER — Ambulatory Visit (INDEPENDENT_AMBULATORY_CARE_PROVIDER_SITE_OTHER): Payer: Medicare Other | Admitting: Family Medicine

## 2015-09-23 VITALS — BP 122/71 | HR 64 | Temp 98.1°F | Wt 264.8 lb

## 2015-09-23 DIAGNOSIS — R739 Hyperglycemia, unspecified: Secondary | ICD-10-CM | POA: Diagnosis not present

## 2015-09-23 DIAGNOSIS — M109 Gout, unspecified: Secondary | ICD-10-CM

## 2015-09-23 DIAGNOSIS — K5900 Constipation, unspecified: Secondary | ICD-10-CM

## 2015-09-23 DIAGNOSIS — E669 Obesity, unspecified: Secondary | ICD-10-CM

## 2015-09-23 DIAGNOSIS — I10 Essential (primary) hypertension: Secondary | ICD-10-CM | POA: Diagnosis not present

## 2015-09-23 DIAGNOSIS — I482 Chronic atrial fibrillation: Secondary | ICD-10-CM

## 2015-09-23 DIAGNOSIS — E559 Vitamin D deficiency, unspecified: Secondary | ICD-10-CM | POA: Diagnosis not present

## 2015-09-23 DIAGNOSIS — J029 Acute pharyngitis, unspecified: Secondary | ICD-10-CM | POA: Insufficient documentation

## 2015-09-23 DIAGNOSIS — J020 Streptococcal pharyngitis: Secondary | ICD-10-CM

## 2015-09-23 DIAGNOSIS — E782 Mixed hyperlipidemia: Secondary | ICD-10-CM

## 2015-09-23 DIAGNOSIS — I4821 Permanent atrial fibrillation: Secondary | ICD-10-CM

## 2015-09-23 NOTE — Assessment & Plan Note (Signed)
Recently treated with antibiotic twice for symptomatic sore throat. No symptoms today. Strep test negative.

## 2015-09-23 NOTE — Assessment & Plan Note (Signed)
Had an injury to left knee with pain but it is improving now and no sign of gout was noted at that time. Check uric acid level today

## 2015-09-23 NOTE — Assessment & Plan Note (Signed)
Encouraged DASH diet, decrease po intake and increase exercise as tolerated. Needs 7-8 hours of sleep nightly. Avoid trans fats, eat small, frequent meals every 4-5 hours with lean proteins, complex carbs and healthy fats. Minimize simple carbs 

## 2015-09-23 NOTE — Progress Notes (Signed)
Patient ID: David [name], male   DOB: [birth year], 70 y.o.   MRN: [id number]   Subjective:    Patient ID: David [name], male    DOB: 1945/12/10, 70 y.o.   MRN: [id number]  Chief Complaint  Patient presents with  . Follow-up    HPI Patient is in today for follow up. He had a fall onto his left knee recently and it was painful and swollen. He feels better  Now. No recent acute illness or recent hospitalization. Denies CP/palp/SOB/HA/congestion/fevers/GI or GU c/o. Taking meds as prescribed  Past Medical History:  Diagnosis Date  . Abnormal liver function 03/18/2013  . Anxiety   . Atrial fibrillation ([place])   . Back pain   . BCC (basal cell carcinoma of skin)    under right eye and right ear  . Diverticulitis   . Diverticulosis   . GERD (gastroesophageal reflux disease)   . Gout   . Hyperlipidemia   . Hypertension   . IBS (irritable bowel syndrome) 03/18/2013  . Internal hemorrhoids   . LAFB (left anterior fascicular block)   . Medicare annual wellness visit, subsequent 03/12/2014  . Obesity 11/25/2007   Qualifier: Diagnosis of  By: Lenna Gilford MD, Deborra Medina   . Other malaise and fatigue 03/13/2013  . Premature ventricular complex   . Preventative health care 03/07/2015  . Rectal irritation 09/18/2012  . Tinea corporis 03/13/2013  . Unspecified constipation 09/25/2013    Past Surgical History:  Procedure Laterality Date  . COLON SURGERY  2009   segmental sigmoid resection  . COLONOSCOPY    . NOSE SURGERY    . SKIN BIOPSY      Family History  Problem Relation Age of Onset  . Heart disease Mother   . Cancer Mother     colon, breast  . Hypertension Mother   . Stroke Mother   . Heart disease Father     pacemaker  . Aortic aneurysm Father   . Hypertension Father   . Heart disease Sister   . Atrial fibrillation Sister   . Obesity Sister   . Sleep apnea Sister   . Heart attack Brother   . Other Brother     muscle disease  . Arthritis Brother   . Stroke Brother   . Cancer  Maternal Grandmother     ?  Marland Kitchen Heart attack Maternal Grandmother   . Diabetes Maternal Grandmother   . Cancer Maternal Grandfather     hodgin's lymphoma  . Heart attack Paternal Grandmother   . Anxiety disorder Paternal Grandmother   . Pneumonia Paternal Grandfather   . Heart attack Brother   . Atrial fibrillation Brother   . Heart attack Brother   . Hypertension Brother   . Hyperlipidemia Brother   . Heart attack Brother   . Other Brother     heart valve operation    Social History   Social History  . Marital status: Married    Spouse name: N/A  . Number of children: 3  . Years of education: N/A   Occupational History  . [employer]   Social History Main Topics  . Smoking status: Former Smoker    Packs/day: 1.00    Years: 6.00    Types: Cigarettes    Start date: 01/12/1969  . Smokeless tobacco: Never Used  . Alcohol use Yes     Comment: 2 drinks every night  . Drug use: No  . Sexual activity: Yes   Other Topics Concern  .  Not on file   Social History Narrative  . No narrative on file    Outpatient Medications Prior to Visit  Medication Sig Dispense Refill  . alum hydroxide-mag trisilicate (GAVISCON) [id number] MG CHEW Chew by mouth.    . Ascorbic Acid (VITAMIN C) 1000 MG tablet Take 1,000 mg by mouth daily.    Marland Kitchen aspirin 81 MG tablet Take 81 mg by mouth daily as needed.     Marland Kitchen atenolol (TENORMIN) 25 MG tablet TAKE ONE TABLET (25 MG) BY MOUTH TWO TIMES DAILY. 60 tablet 2  . atorvastatin (LIPITOR) 40 MG tablet Take 20 mg by mouth 2 (two) times daily.     . clindamycin (CLEOCIN) 150 MG capsule Take 1 capsule (150 mg total) by mouth 3 (three) times daily. 21 capsule 0  . cyclobenzaprine (FLEXERIL) 5 MG tablet Take 1 tablet (5 mg total) by mouth 3 (three) times daily as needed for muscle spasms. 30 tablet 1  . fluticasone (FLONASE) 50 MCG/ACT nasal spray Place 2 sprays into the nose daily as needed. For congestion    . hydrocortisone (PROCTOZONE-HC) 2.5 % rectal cream Place  1 application rectally as needed. 30 g 2  . LORazepam (ATIVAN) 1 MG tablet TAKE 1 TABLET BY MOUTH EVERY 8 HOURS AS NEEDED FOR ANXIETY OR SLEEP 90 tablet 3  . losartan (COZAAR) 50 MG tablet Take 1 tablet (50 mg total) by mouth daily. 30 tablet 3  . meloxicam (MOBIC) 15 MG tablet PRN As directed    . omeprazole (PRILOSEC) 20 MG capsule TAKE ONE CAPSULE BY MOUTH EVERY DAY 30 capsule 3  . sertraline (ZOLOFT) 100 MG tablet Take 1 tablet (100 mg total) by mouth daily. 60 tablet 2   No facility-administered medications prior to visit.     Allergies  Allergen Reactions  . Cefaclor Hives  . Cephalosporins Hives  . Penicillins Rash    Mild maculopapular rash    Review of Systems  Constitutional: Negative for fever and malaise/fatigue.  HENT: Positive for congestion.   Eyes: Negative for blurred vision.  Respiratory: Negative for shortness of breath.   Cardiovascular: Negative for chest pain, palpitations and leg swelling.  Gastrointestinal: Negative for abdominal pain, blood in stool and nausea.  Genitourinary: Negative for dysuria and frequency.  Musculoskeletal: Negative for falls.  Skin: Negative for rash.  Neurological: Negative for dizziness, loss of consciousness and headaches.  Endo/Heme/Allergies: Negative for environmental allergies.  Psychiatric/Behavioral: Negative for depression. The patient is not nervous/anxious.        Objective:    Physical Exam  Constitutional: He is oriented to person, place, and time. He appears well-developed and well-nourished. No distress.  HENT:  Head: Normocephalic and atraumatic.  Nose: Nose normal.  Eyes: Right eye exhibits no discharge. Left eye exhibits no discharge.  Neck: Normal range of motion. Neck supple.  Cardiovascular: Normal rate and regular rhythm.   No murmur heard. Pulmonary/Chest: Effort normal and breath sounds normal.  Abdominal: Soft. Bowel sounds are normal. There is no tenderness.  Musculoskeletal: He exhibits no  edema.  Neurological: He is alert and oriented to person, place, and time.  Skin: Skin is warm and dry.  Psychiatric: He has a normal mood and affect.  Nursing note and vitals reviewed.   BP 122/71 (BP Location: Left Arm, Patient Position: Sitting, Cuff Size: Large)   Pulse 64   Temp 98.1 F (36.7 C) (Oral)   Wt 264 lb 12.8 oz (120.1 kg)   SpO2 97%   BMI 34.94 kg/m  Wt Readings from Last 3 Encounters:  09/23/15 264 lb 12.8 oz (120.1 kg)  09/20/15 264 lb (119.7 kg)  09/13/15 264 lb 3.2 oz (119.8 kg)     Lab Results  Component Value Date   WBC 6.7 06/07/2015   HGB 14.5 06/07/2015   HCT 43.4 06/07/2015   PLT 203.0 06/07/2015   GLUCOSE 107 (H) 06/07/2015   CHOL 151 06/07/2015   TRIG 98.0 06/07/2015   HDL 31.70 (L) 06/07/2015   LDLDIRECT 168.5 03/29/2006   LDLCALC 100 (H) 06/07/2015   ALT 48 06/07/2015   AST 37 06/07/2015   NA 139 06/07/2015   K 4.2 06/07/2015   CL 105 06/07/2015   CREATININE 1.31 06/07/2015   BUN 15 06/07/2015   CO2 28 06/07/2015   TSH 4.31 06/07/2015   PSA 1.47 02/23/2014   INR 1.39 11/14/2010   HGBA1C 6.0 06/07/2015    Lab Results  Component Value Date   TSH 4.31 06/07/2015   Lab Results  Component Value Date   WBC 6.7 06/07/2015   HGB 14.5 06/07/2015   HCT 43.4 06/07/2015   MCV 93.7 06/07/2015   PLT 203.0 06/07/2015   Lab Results  Component Value Date   NA 139 06/07/2015   K 4.2 06/07/2015   CO2 28 06/07/2015   GLUCOSE 107 (H) 06/07/2015   BUN 15 06/07/2015   CREATININE 1.31 06/07/2015   BILITOT 0.6 06/07/2015   ALKPHOS 74 06/07/2015   AST 37 06/07/2015   ALT 48 06/07/2015   PROT 6.6 06/07/2015   ALBUMIN 4.0 06/07/2015   CALCIUM 9.7 06/07/2015   ANIONGAP 6 09/27/2014   GFR 57.48 (L) 06/07/2015   Lab Results  Component Value Date   CHOL 151 06/07/2015   Lab Results  Component Value Date   HDL 31.70 (L) 06/07/2015   Lab Results  Component Value Date   LDLCALC 100 (H) 06/07/2015   Lab Results  Component Value Date    TRIG 98.0 06/07/2015   Lab Results  Component Value Date   CHOLHDL 5 06/07/2015   Lab Results  Component Value Date   HGBA1C 6.0 06/07/2015       Assessment & Plan:   Problem List Items Addressed This Visit    Obesity    Encouraged DASH diet, decrease po intake and increase exercise as tolerated. Needs 7-8 hours of sleep nightly. Avoid trans fats, eat small, frequent meals every 4-5 hours with lean proteins, complex carbs and healthy fats. Minimize simple carbs      Hyperlipidemia, mixed    Encouraged heart healthy diet, increase exercise, avoid trans fats, consider a krill oil cap daily      Relevant Orders   Lipid panel   Essential hypertension, benign    Well controlled, no changes to meds. Encouraged heart healthy diet such as the DASH diet and exercise as tolerated.       Relevant Orders   TSH   CBC   Atrial fibrillation (HCC) - Primary   Gout    Had an injury to left knee with pain but it is improving now and no sign of gout was noted at that time. Check uric acid level today      Relevant Orders   Uric acid   Vitamin D deficiency   Relevant Orders   Vitamin D (25 hydroxy)   Constipation    Encouraged increased hydration and fiber in diet. Daily probiotics. If bowels not moving can use MOM 2 tbls po in 4 oz of warm  prune juice by mouth every 2-3 days. If no results then repeat in 4 hours with  Dulcolax suppository pr, may repeat again in 4 more hours as needed. Seek care if symptoms worsen. Consider daily Miralax and/or Dulcolax if symptoms persist.       Pharyngitis    Recently treated with antibiotic twice for symptomatic sore throat. No symptoms today. Strep test negative.        Other Visit Diagnoses    Hyperglycemia       Relevant Orders   Hemoglobin A1c   Comprehensive metabolic panel   Streptococcal sore throat       Relevant Orders   Culture, Group A Strep      I am having Mr. Keetch maintain his atorvastatin, fluticasone, meloxicam, alum  hydroxide-mag trisilicate, aspirin, omeprazole, LORazepam, sertraline, cyclobenzaprine, hydrocortisone, losartan, atenolol, vitamin C, and clindamycin.  No orders of the defined types were placed in this encounter.    Penni Homans, MD

## 2015-09-23 NOTE — Assessment & Plan Note (Signed)
Well controlled, no changes to meds. Encouraged heart healthy diet such as the DASH diet and exercise as tolerated.  °

## 2015-09-23 NOTE — Assessment & Plan Note (Signed)
Encouraged heart healthy diet, increase exercise, avoid trans fats, consider a krill oil cap daily 

## 2015-09-23 NOTE — Patient Instructions (Signed)

## 2015-09-23 NOTE — Progress Notes (Signed)
Pre visit review using our clinic review tool, if applicable. No additional management support is needed unless otherwise documented below in the visit note. 

## 2015-09-23 NOTE — Assessment & Plan Note (Signed)
Encouraged increased hydration and fiber in diet. Daily probiotics. If bowels not moving can use MOM 2 tbls po in 4 oz of warm prune juice by mouth every 2-3 days. If no results then repeat in 4 hours with  Dulcolax suppository pr, may repeat again in 4 more hours as needed. Seek care if symptoms worsen. Consider daily Miralax and/or Dulcolax if symptoms persist.  

## 2015-09-24 LAB — CBC
HEMATOCRIT: 44 % (ref 39.0–52.0)
HEMOGLOBIN: 15 g/dL (ref 13.0–17.0)
MCHC: 34 g/dL (ref 30.0–36.0)
MCV: 93.4 fl (ref 78.0–100.0)
Platelets: 219 10*3/uL (ref 150.0–400.0)
RBC: 4.71 Mil/uL (ref 4.22–5.81)
RDW: 14.2 % (ref 11.5–15.5)
WBC: 8.2 10*3/uL (ref 4.0–10.5)

## 2015-09-24 LAB — COMPREHENSIVE METABOLIC PANEL
ALT: 39 U/L (ref 0–53)
AST: 26 U/L (ref 0–37)
Albumin: 4.1 g/dL (ref 3.5–5.2)
Alkaline Phosphatase: 70 U/L (ref 39–117)
BUN: 15 mg/dL (ref 6–23)
CALCIUM: 9.5 mg/dL (ref 8.4–10.5)
CHLORIDE: 102 meq/L (ref 96–112)
CO2: 29 meq/L (ref 19–32)
CREATININE: 1.17 mg/dL (ref 0.40–1.50)
GFR: 65.43 mL/min (ref >60.00)
Glucose, Bld: 90 mg/dL (ref 70–99)
POTASSIUM: 4.4 meq/L (ref 3.5–5.1)
SODIUM: 136 meq/L (ref 135–145)
Total Bilirubin: 0.7 mg/dL (ref 0.2–1.2)
Total Protein: 7.1 g/dL (ref 6.0–8.3)

## 2015-09-24 LAB — HEMOGLOBIN A1C: Hgb A1c MFr Bld: 5.8 % (ref 4.6–6.5)

## 2015-09-24 LAB — LIPID PANEL
CHOL/HDL RATIO: 5
CHOLESTEROL: 170 mg/dL (ref 0–200)
HDL: 36.5 mg/dL — ABNORMAL LOW (ref >39.00)
LDL CALC: 105 mg/dL — AB (ref 0–99)
NonHDL: 133.32
TRIGLYCERIDES: 142 mg/dL (ref 0.0–149.0)
VLDL: 28.4 mg/dL (ref 0.0–40.0)

## 2015-09-24 LAB — URIC ACID: Uric Acid, Serum: 7.5 mg/dL (ref 4.0–7.8)

## 2015-09-24 LAB — VITAMIN D 25 HYDROXY (VIT D DEFICIENCY, FRACTURES): VITD: 48.94 ng/mL (ref 30.00–100.00)

## 2015-09-24 LAB — TSH: TSH: 4.09 u[IU]/mL (ref 0.35–4.50)

## 2015-09-25 LAB — CULTURE, GROUP A STREP: Organism ID, Bacteria: NORMAL

## 2015-10-03 ENCOUNTER — Other Ambulatory Visit: Payer: Self-pay | Admitting: Family Medicine

## 2015-10-10 ENCOUNTER — Other Ambulatory Visit: Payer: Self-pay | Admitting: Physician Assistant

## 2015-10-11 ENCOUNTER — Other Ambulatory Visit: Payer: Self-pay | Admitting: Family Medicine

## 2015-10-25 ENCOUNTER — Telehealth: Payer: Self-pay | Admitting: Family Medicine

## 2015-10-25 NOTE — Telephone Encounter (Signed)
Will take care of upon request.

## 2015-10-25 NOTE — Telephone Encounter (Signed)
Patient is calling to inform Dr. Charlett Blake that he is going to Dr. Karsten Ro for blood in his semen. He is requesting that if there is anything they need if Dr. Charlett Blake could fax it to them. They are currently requesting a list of his medications.  Please advise.   Patient phone: [phone]

## 2015-11-04 ENCOUNTER — Other Ambulatory Visit: Payer: Self-pay | Admitting: Family Medicine

## 2015-11-04 DIAGNOSIS — E782 Mixed hyperlipidemia: Secondary | ICD-10-CM

## 2015-11-04 DIAGNOSIS — N289 Disorder of kidney and ureter, unspecified: Secondary | ICD-10-CM

## 2015-11-04 DIAGNOSIS — I4821 Permanent atrial fibrillation: Secondary | ICD-10-CM

## 2015-11-04 DIAGNOSIS — E559 Vitamin D deficiency, unspecified: Secondary | ICD-10-CM

## 2015-11-04 DIAGNOSIS — K760 Fatty (change of) liver, not elsewhere classified: Secondary | ICD-10-CM

## 2015-11-04 MED ORDER — LORAZEPAM 1 MG PO TABS: ORAL_TABLET | ORAL | 1 refills | Status: DC

## 2015-11-04 NOTE — Telephone Encounter (Signed)
Requesting:  Lorazepam Contract  Signed on 03/02/14 UDS  Low risk due now Last OV  09/23/2015 Last Refill  03/07/2015  #90 with 3 refills.  Please Advise

## 2015-11-04 NOTE — Telephone Encounter (Signed)
Faxed to pharmacy

## 2015-12-06 ENCOUNTER — Other Ambulatory Visit: Payer: Self-pay | Admitting: Family Medicine

## 2015-12-16 ENCOUNTER — Ambulatory Visit (INDEPENDENT_AMBULATORY_CARE_PROVIDER_SITE_OTHER): Payer: Medicare Other | Admitting: Family Medicine

## 2015-12-16 VITALS — BP 128/70 | HR 68 | Temp 98.3°F | Ht 73.0 in | Wt 270.8 lb

## 2015-12-16 DIAGNOSIS — J029 Acute pharyngitis, unspecified: Secondary | ICD-10-CM | POA: Diagnosis not present

## 2015-12-16 DIAGNOSIS — Z8679 Personal history of other diseases of the circulatory system: Secondary | ICD-10-CM

## 2015-12-16 LAB — POCT RAPID STREP A (OFFICE): Rapid Strep A Screen: NEGATIVE

## 2015-12-16 NOTE — Patient Instructions (Signed)
We are going to send your throat culture off to the lab- I will let you know what it shows.  I suspect that your rapid strep is giving [place] a false positive for whatever reason.  In the future I would consider doing only a throat culture in making treatment decisions unless you have a fever or other more serious symptoms.

## 2015-12-16 NOTE — Progress Notes (Signed)
[place] at [facility] [address] [place], [place] [address] [phone] [phone]  Date:  12/16/2015   Name:  David [name]   DOB:  20-Jan-1945   MRN:  [id number]  PCP:  Penni Homans, MD    Chief Complaint: Sore Throat (c/o sore throat that started 2 weeks ago. Was seen at Urgent Care and given clindamycin. About half through with clindamycin. Pt states that he has had strep 3 times in the past year. )   History of Present Illness:  David [name] is a 70 y.o. very pleasant male patient who presents with the following:  He has tested positive for step 6/9 (negative culture), 8/25, 9/1, 9/8 (negative culture).  Here today to follow-up- he was seen at [facility] for a ST last week on Thursday.  They did a swab but he is not sure if it was a rapid or a culture.   He went to [facility].  They started him on clindamycin- he is feeling better as far as his ST.   He has not noted any fever. His sinuses are congested  He notes that about 20 years ago he had some episodes of strep throat- however  He does have a grand-son who he spends a lot of time with. He is 70 yo and in daycare.   He has NOT had a tonsillectomy    Patient Active Problem List   Diagnosis Date Noted  . Pharyngitis 09/23/2015  . Complex renal cyst 06/16/2015  . Paroxysmal a-fib ([place]) 06/16/2015  . Snoring 06/07/2015  . Somnolence 06/07/2015  . Preventative health care 03/07/2015  . Lipoma of axilla 06/04/2014  . Tinea cruris 03/20/2014  . Lichen simplex chronicus 03/20/2014  . Medicare annual wellness visit, subsequent 03/12/2014  . Depression with anxiety 09/25/2013  . Constipation 09/25/2013  . Gouty arthritis of toe of left foot 08/01/2013  . Vitamin D deficiency 06/18/2013  . Skin rash 05/27/2013  . Abnormal liver function 03/18/2013  . IBS (irritable bowel syndrome) 03/18/2013  . Other malaise and fatigue 03/13/2013  . Tinea corporis 03/13/2013  .  Gout 12/14/2012  . Diarrhea 12/14/2012  . Renal insufficiency 12/14/2012  . Rectal irritation 09/18/2012  . Diverticulosis   . BCC (basal cell carcinoma of skin)   . Scoliosis 01/19/2011  . Atrial fibrillation ([place]) 11/27/2010  . Hyperlipidemia, mixed 03/11/2010  . Essential hypertension, benign 03/11/2010  . ALLERGIC RHINITIS 03/11/2010  . Obesity 11/25/2007  . BENIGN NEOPLASM OF ADRENAL GLAND 09/22/2007  . Fatty liver disease, nonalcoholic 0000000  . DEGENERATIVE JOINT DISEASE 09/22/2007  . SPONDYLOSIS, LUMBAR 09/22/2007  . GERD 04/12/2007    Past Medical History:  Diagnosis Date  . Abnormal liver function 03/18/2013  . Anxiety   . Atrial fibrillation ([place])   . Back pain   . BCC (basal cell carcinoma of skin)    under right eye and right ear  . Diverticulitis   . Diverticulosis   . GERD (gastroesophageal reflux disease)   . Gout   . Hyperlipidemia   . Hypertension   . IBS (irritable bowel syndrome) 03/18/2013  . Internal hemorrhoids   . LAFB (left anterior fascicular block)   . Medicare annual wellness visit, subsequent 03/12/2014  . Obesity 11/25/2007   Qualifier: Diagnosis of  By: Lenna Gilford MD, Deborra Medina   . Other malaise and fatigue 03/13/2013  . Premature ventricular complex   . Preventative health care 03/07/2015  . Rectal  irritation 09/18/2012  . Tinea corporis 03/13/2013  . Unspecified constipation 09/25/2013    Past Surgical History:  Procedure Laterality Date  . COLON SURGERY  2009   segmental sigmoid resection  . COLONOSCOPY    . NOSE SURGERY    . SKIN BIOPSY      Social History  Substance Use Topics  . Smoking status: Former Smoker    Packs/day: 1.00    Years: 6.00    Types: Cigarettes    Start date: 01/12/1969  . Smokeless tobacco: Never Used  . Alcohol use Yes     Comment: 2 drinks every night    Family History  Problem Relation Age of Onset  . Heart disease Mother   . Cancer Mother     colon, breast  . Hypertension Mother   . Stroke Mother   .  Heart disease Father     pacemaker  . Aortic aneurysm Father   . Hypertension Father   . Heart disease Sister   . Atrial fibrillation Sister   . Obesity Sister   . Sleep apnea Sister   . Heart attack Brother   . Other Brother     muscle disease  . Arthritis Brother   . Stroke Brother   . Cancer Maternal Grandmother     ?  Marland Kitchen Heart attack Maternal Grandmother   . Diabetes Maternal Grandmother   . Cancer Maternal Grandfather     hodgin's lymphoma  . Heart attack Paternal Grandmother   . Anxiety disorder Paternal Grandmother   . Pneumonia Paternal Grandfather   . Heart attack Brother   . Atrial fibrillation Brother   . Heart attack Brother   . Hypertension Brother   . Hyperlipidemia Brother   . Heart attack Brother   . Other Brother     heart valve operation    Allergies  Allergen Reactions  . Cefaclor Hives  . Cephalosporins Hives  . Penicillins Rash    Mild maculopapular rash    Medication list has been reviewed and updated.  Current Outpatient Prescriptions on File Prior to Visit  Medication Sig Dispense Refill  . alum hydroxide-mag trisilicate (GAVISCON) [id number] MG CHEW Chew by mouth.    . Ascorbic Acid (VITAMIN C) 1000 MG tablet Take 1,000 mg by mouth daily.    Marland Kitchen aspirin 81 MG tablet Take 81 mg by mouth daily as needed.     Marland Kitchen atenolol (TENORMIN) 25 MG tablet TAKE ONE TABLET BY MOUTH TWICE A DAY 60 tablet 6  . atorvastatin (LIPITOR) 40 MG tablet Take 20 mg by mouth 2 (two) times daily.     . clindamycin (CLEOCIN) 150 MG capsule Take 1 capsule (150 mg total) by mouth 3 (three) times daily. 21 capsule 0  . cyclobenzaprine (FLEXERIL) 5 MG tablet Take 1 tablet (5 mg total) by mouth 3 (three) times daily as needed for muscle spasms. 30 tablet 1  . fluticasone (FLONASE) 50 MCG/ACT nasal spray Place 2 sprays into the nose daily as needed. For congestion    . hydrocortisone (PROCTOZONE-HC) 2.5 % rectal cream Place 1 application rectally as needed. 30 g 2  . LORazepam  (ATIVAN) 1 MG tablet TAKE 1 TABLET BY MOUTH EVERY 8 HOURS AS NEEDED FOR ANXIETY OR SLEEP 90 tablet 1  . losartan (COZAAR) 50 MG tablet TAKE ONE TABLET BY MOUTH DAILY 30 tablet 2  . meloxicam (MOBIC) 15 MG tablet PRN As directed    . omeprazole (PRILOSEC) 20 MG capsule TAKE ONE CAPSULE BY MOUTH ONCE  DAILY 30 capsule 6  . sertraline (ZOLOFT) 100 MG tablet Take 1 tablet (100 mg total) by mouth daily. 60 tablet 2   No current facility-administered medications on file prior to visit.     Review of Systems:  As per HPI- otherwise negative. No fever, chills, cough. ST is improved  Physical Examination: Vitals:   12/16/15 1120  BP: 128/70  Pulse: 68  Temp: 98.3 F (36.8 C)   Vitals:   12/16/15 1120  Weight: 270 lb 12.8 oz (122.8 kg)  Height: 6\' 1"  (1.854 m)   Body mass index is 35.73 kg/m. Ideal Body Weight: Weight in (lb) to have BMI = 25: 189.1  GEN: WDWN, NAD, Non-toxic, A & O x 3, obese, looks well HEENT: Atraumatic, Normocephalic. Neck supple. No masses, No LAD.  Bilateral TM wnl, oropharynx normal.  PEERL,EOMI.   He has minimal tonsillar tissue present Ears and Nose: No external deformity. CV: RRR, No M/G/R. No JVD. No thrill. No extra heart sounds. PULM: CTA B, no wheezes, crackles, rhonchi. No retractions. No resp. distress. No accessory muscle use. EXTR: No c/c/e NEURO Normal gait.  PSYCH: Normally interactive. Conversant. Not depressed or anxious appearing.  Calm demeanor.   Results for orders placed or performed in visit on 12/16/15  POCT rapid strep A  Result Value Ref Range   Rapid Strep A Screen Negative Negative    Assessment and Plan: Pharyngitis, unspecified etiology - Plan: POCT rapid strep A, Culture, Group A Strep  History of atrial fibrillation  Here today to follow-up on frequent pharyngitis over the last 6 months or so.Marland Kitchen  He has had several positive rapid tests- however culture have been negative and he is generally in a low risk group for strep.   Suspect that he has had false positive rapid testing.   Rapid today is negative- will send out a culture as well.   Counseled that for future sore throat it may be best to treat off the culture and not do a rapid strep for him  He had one episode of a fib year ago- has not recurred.  Corrected his problem list for him today   Signed Lamar Blinks, MD

## 2015-12-16 NOTE — Progress Notes (Signed)
Pre visit review using our clinic review tool, if applicable. No additional management support is needed unless otherwise documented below in the visit note. 

## 2015-12-18 LAB — CULTURE, GROUP A STREP: ORGANISM ID, BACTERIA: NORMAL

## 2015-12-30 ENCOUNTER — Other Ambulatory Visit: Payer: Self-pay | Admitting: Family Medicine

## 2016-02-27 ENCOUNTER — Other Ambulatory Visit: Payer: Self-pay | Admitting: Family Medicine

## 2016-03-24 ENCOUNTER — Other Ambulatory Visit: Payer: Self-pay | Admitting: Family Medicine

## 2016-03-26 ENCOUNTER — Ambulatory Visit (INDEPENDENT_AMBULATORY_CARE_PROVIDER_SITE_OTHER): Payer: Medicare Other | Admitting: Family Medicine

## 2016-03-26 ENCOUNTER — Encounter: Payer: Self-pay | Admitting: Family Medicine

## 2016-03-26 VITALS — BP 122/70 | HR 56 | Temp 97.7°F | Ht 73.0 in | Wt 264.6 lb

## 2016-03-26 DIAGNOSIS — M170 Bilateral primary osteoarthritis of knee: Secondary | ICD-10-CM | POA: Diagnosis not present

## 2016-03-26 DIAGNOSIS — K5900 Constipation, unspecified: Secondary | ICD-10-CM | POA: Diagnosis not present

## 2016-03-26 DIAGNOSIS — M545 Low back pain, unspecified: Secondary | ICD-10-CM

## 2016-03-26 DIAGNOSIS — Z0000 Encounter for general adult medical examination without abnormal findings: Secondary | ICD-10-CM | POA: Diagnosis not present

## 2016-03-26 DIAGNOSIS — H9193 Unspecified hearing loss, bilateral: Secondary | ICD-10-CM | POA: Diagnosis not present

## 2016-03-26 DIAGNOSIS — K219 Gastro-esophageal reflux disease without esophagitis: Secondary | ICD-10-CM

## 2016-03-26 DIAGNOSIS — H269 Unspecified cataract: Secondary | ICD-10-CM

## 2016-03-26 DIAGNOSIS — I10 Essential (primary) hypertension: Secondary | ICD-10-CM

## 2016-03-26 DIAGNOSIS — E6609 Other obesity due to excess calories: Secondary | ICD-10-CM | POA: Diagnosis not present

## 2016-03-26 DIAGNOSIS — E782 Mixed hyperlipidemia: Secondary | ICD-10-CM

## 2016-03-26 DIAGNOSIS — M109 Gout, unspecified: Secondary | ICD-10-CM

## 2016-03-26 DIAGNOSIS — R739 Hyperglycemia, unspecified: Secondary | ICD-10-CM | POA: Diagnosis not present

## 2016-03-26 DIAGNOSIS — E559 Vitamin D deficiency, unspecified: Secondary | ICD-10-CM

## 2016-03-26 DIAGNOSIS — J029 Acute pharyngitis, unspecified: Secondary | ICD-10-CM | POA: Diagnosis not present

## 2016-03-26 DIAGNOSIS — H9190 Unspecified hearing loss, unspecified ear: Secondary | ICD-10-CM | POA: Insufficient documentation

## 2016-03-26 HISTORY — DX: Unspecified hearing loss, bilateral: H91.93

## 2016-03-26 HISTORY — DX: Bilateral primary osteoarthritis of knee: M17.0

## 2016-03-26 HISTORY — DX: Low back pain, unspecified: M54.50

## 2016-03-26 LAB — CBC
HEMATOCRIT: 45 % (ref 39.0–52.0)
Hemoglobin: 15.1 g/dL (ref 13.0–17.0)
MCHC: 33.4 g/dL (ref 30.0–36.0)
MCV: 93.2 fl (ref 78.0–100.0)
PLATELETS: 246 10*3/uL (ref 150.0–400.0)
RBC: 4.83 Mil/uL (ref 4.22–5.81)
RDW: 14.5 % (ref 11.5–15.5)
WBC: 7.4 10*3/uL (ref 4.0–10.5)

## 2016-03-26 LAB — COMPREHENSIVE METABOLIC PANEL
ALBUMIN: 4.1 g/dL (ref 3.5–5.2)
ALT: 35 U/L (ref 0–53)
AST: 29 U/L (ref 0–37)
Alkaline Phosphatase: 61 U/L (ref 39–117)
BUN: 15 mg/dL (ref 6–23)
CALCIUM: 10.2 mg/dL (ref 8.4–10.5)
CHLORIDE: 103 meq/L (ref 96–112)
CO2: 29 meq/L (ref 19–32)
Creatinine, Ser: 1.34 mg/dL (ref 0.40–1.50)
GFR: 55.87 mL/min — AB (ref >60.00)
Glucose, Bld: 98 mg/dL (ref 70–99)
POTASSIUM: 4.2 meq/L (ref 3.5–5.1)
Sodium: 139 mEq/L (ref 135–145)
Total Bilirubin: 0.6 mg/dL (ref 0.2–1.2)
Total Protein: 7.2 g/dL (ref 6.0–8.3)

## 2016-03-26 LAB — URIC ACID: Uric Acid, Serum: 8.1 mg/dL — ABNORMAL HIGH (ref 4.0–7.8)

## 2016-03-26 LAB — MAGNESIUM: Magnesium: 1.9 mg/dL (ref 1.5–2.5)

## 2016-03-26 LAB — LIPID PANEL
CHOLESTEROL: 227 mg/dL — AB (ref 0–200)
HDL: 37 mg/dL — AB (ref >39.00)
LDL CALC: 162 mg/dL — AB (ref 0–99)
NonHDL: 190
TRIGLYCERIDES: 142 mg/dL (ref 0.0–149.0)
Total CHOL/HDL Ratio: 6
VLDL: 28.4 mg/dL (ref 0.0–40.0)

## 2016-03-26 LAB — POCT RAPID STREP A (OFFICE): RAPID STREP A SCREEN: NEGATIVE

## 2016-03-26 LAB — VITAMIN D 25 HYDROXY (VIT D DEFICIENCY, FRACTURES): VITD: 36.01 ng/mL (ref 30.00–100.00)

## 2016-03-26 LAB — HEMOGLOBIN A1C: HEMOGLOBIN A1C: 5.9 % (ref 4.6–6.5)

## 2016-03-26 LAB — TSH: TSH: 3.87 u[IU]/mL (ref 0.35–4.50)

## 2016-03-26 NOTE — Assessment & Plan Note (Signed)
Stay active and use topical meds

## 2016-03-26 NOTE — Assessment & Plan Note (Signed)
Encouraged heart healthy diet, increase exercise, avoid trans fats, consider a krill oil cap daily 

## 2016-03-26 NOTE — Assessment & Plan Note (Signed)
Patient encouraged to maintain heart healthy diet, regular exercise, adequate sleep. Consider daily probiotics. Take medications as prescribed. Given and reviewed copy of ACP documents from [place] Secretary of State and encouraged to complete and return 

## 2016-03-26 NOTE — Assessment & Plan Note (Signed)
Check strep test today, asymptomatic presently but has frequent infections

## 2016-03-26 NOTE — Assessment & Plan Note (Signed)
Dr Pierre Bali, opthamology will have right cataract removed later this year then the left

## 2016-03-26 NOTE — Assessment & Plan Note (Signed)
No flares in 2 years

## 2016-03-26 NOTE — Assessment & Plan Note (Signed)
Well controlled, no changes to meds. Encouraged heart healthy diet such as the DASH diet and exercise as tolerated.  °

## 2016-03-26 NOTE — Assessment & Plan Note (Signed)
Encouraged increased hydration and fiber in diet. Daily probiotics. If bowels not moving can use MOM 2 tbls po in 4 oz of warm prune juice by mouth every 2-3 days. If no results then repeat in 4 hours with  Dulcolax suppository pr, may repeat again in 4 more hours as needed. Seek care if symptoms worsen. Consider daily Miralax and/or Dulcolax if symptoms persist. Add Benefiber bid

## 2016-03-26 NOTE — Assessment & Plan Note (Signed)
Encouraged DASH diet, decrease po intake and increase exercise as tolerated. Needs 7-8 hours of sleep nightly. Avoid trans fats, eat small, frequent meals every 4-5 hours with lean proteins, complex carbs and healthy fats. Minimize simple carbs, 

## 2016-03-26 NOTE — Progress Notes (Addendum)
Patient ID: David [name], male   DOB: 03/28/1945, 71 y.o.   MRN: [id number]   Subjective:  I acted as a Education administrator for Penni Homans, [place], [place]   Patient ID: David [name], male    DOB: 28-Jan-1945, 71 y.o.   MRN: [id number]  Chief Complaint  Patient presents with  . Annual Exam  . Hypertension  . Gastroesophageal Reflux    Hypertension  This is a chronic problem. The problem is controlled. Associated symptoms include malaise/fatigue. Pertinent negatives include no blurred vision, chest pain, headaches, palpitations or shortness of breath. Risk factors for coronary artery disease include male gender.  Gastroesophageal Reflux  He complains of a sore throat. He reports no chest pain or no coughing. This is a chronic problem.  Back Pain  This is a chronic problem. The problem occurs daily. The problem is unchanged. Pertinent negatives include no chest pain, fever or headaches.    Patient is in today for an annual examination. Patient has complains of ongoing back pain and right side pain. States that both have been going on for quite some time. Patient has a Hx of HTN, GERD, IBS, gout. Patient has no acute concerns noted at this time. Denies CP/palp/SOB/HA/congestion/fevers/GI or GU c/o. Taking meds as prescribed  Patient Care Team: Mosie Lukes, MD as PCP - General (Family Medicine) Linward Natal, MD as Consulting Physician (Ophthalmology) Danella Sensing, MD as Consulting Physician (Dermatology)   Past Medical History:  Diagnosis Date  . Abnormal liver function 03/18/2013  . Anxiety   . Arthritis of both knees 03/26/2016  . Atrial fibrillation ([place])   . Back pain   . BCC (basal cell carcinoma of skin)    under right eye and right ear  . Cataracts, bilateral 03/26/2016  . Diverticulitis   . Diverticulosis   . GERD (gastroesophageal reflux disease)   . Gout   . Hearing loss of both ears 03/26/2016  . Hyperglycemia 03/29/2016  . Hyperlipidemia   . Hypertension   . IBS  (irritable bowel syndrome) 03/18/2013  . Internal hemorrhoids   . LAFB (left anterior fascicular block)   . Low back pain 03/26/2016  . Medicare annual wellness visit, subsequent 03/12/2014  . Obesity 11/25/2007   Qualifier: Diagnosis of  By: Lenna Gilford MD, Deborra Medina   . Other malaise and fatigue 03/13/2013  . Premature ventricular complex   . Preventative health care 03/07/2015  . Rectal irritation 09/18/2012  . Tinea corporis 03/13/2013  . Unspecified constipation 09/25/2013    Past Surgical History:  Procedure Laterality Date  . COLON SURGERY  2009   segmental sigmoid resection  . COLONOSCOPY    . NOSE SURGERY    . SKIN BIOPSY      Family History  Problem Relation Age of Onset  . Heart disease Mother   . Cancer Mother     colon, breast  . Hypertension Mother   . Stroke Mother   . Heart disease Father     pacemaker  . Aortic aneurysm Father   . Hypertension Father   . Heart disease Sister   . Atrial fibrillation Sister   . Obesity Sister   . Sleep apnea Sister   . Heart attack Brother   . Other Brother     muscle disease  . Arthritis Brother   . Stroke Brother   . Cancer Maternal Grandmother     ?  Marland Kitchen Heart attack Maternal Grandmother   . Diabetes Maternal Grandmother   . Cancer  Maternal Grandfather     hodgin's lymphoma  . Heart attack Paternal Grandmother   . Anxiety disorder Paternal Grandmother   . Pneumonia Paternal Grandfather   . Heart attack Brother   . Atrial fibrillation Brother   . Heart attack Brother   . Hypertension Brother   . Hyperlipidemia Brother   . Heart attack Brother   . Other Brother     heart valve operation    Social History   Social History  . Marital status: Married    Spouse name: N/A  . Number of children: 3  . Years of education: N/A   Occupational History  . [employer]   Social History Main Topics  . Smoking status: Former Smoker    Packs/day: 1.00    Years: 6.00    Types: Cigarettes    Start date: 01/12/1969  . Smokeless  tobacco: Never Used  . Alcohol use Yes     Comment: 2 drinks every night  . Drug use: No  . Sexual activity: Yes   Other Topics Concern  . Not on file   Social History Narrative  . No narrative on file    Outpatient Medications Prior to Visit  Medication Sig Dispense Refill  . alum hydroxide-mag trisilicate (GAVISCON) 83-38 MG CHEW Chew by mouth.    . Ascorbic Acid (VITAMIN C) 1000 MG tablet Take 1,000 mg by mouth daily.    Marland Kitchen aspirin 81 MG tablet Take 81 mg by mouth daily as needed.     Marland Kitchen atenolol (TENORMIN) 25 MG tablet TAKE ONE TABLET BY MOUTH TWICE A DAY 60 tablet 6  . atorvastatin (LIPITOR) 40 MG tablet Take 20 mg by mouth 2 (two) times daily.     . cyclobenzaprine (FLEXERIL) 5 MG tablet Take 1 tablet (5 mg total) by mouth 3 (three) times daily as needed for muscle spasms. 30 tablet 1  . fluticasone (FLONASE) 50 MCG/ACT nasal spray Place 2 sprays into the nose daily as needed. For congestion    . hydrocortisone (PROCTOZONE-HC) 2.5 % rectal cream Place 1 application rectally as needed. 30 g 2  . LORazepam (ATIVAN) 1 MG tablet TAKE 1 TABLET BY MOUTH EVERY 8 HOURS AS NEEDED FOR ANXIETY OR SLEEP 90 tablet 1  . losartan (COZAAR) 50 MG tablet TAKE ONE TABLET BY MOUTH DAILY 30 tablet 0  . meloxicam (MOBIC) 15 MG tablet PRN As directed    . omeprazole (PRILOSEC) 20 MG capsule TAKE ONE CAPSULE BY MOUTH ONCE DAILY 30 capsule 6  . sertraline (ZOLOFT) 100 MG tablet Take 1 tablet (100 mg total) by mouth daily. 60 tablet 2  . clindamycin (CLEOCIN) 150 MG capsule Take 1 capsule (150 mg total) by mouth 3 (three) times daily. (Patient not taking: Reported on 03/26/2016) 21 capsule 0   No facility-administered medications prior to visit.     Allergies  Allergen Reactions  . Cefaclor Hives  . Cephalosporins Hives  . Penicillins Rash    Mild maculopapular rash    Review of Systems  Constitutional: Positive for malaise/fatigue. Negative for fever.  HENT: Positive for sore throat. Negative  for congestion.   Eyes: Negative for blurred vision.  Respiratory: Negative for cough and shortness of breath.   Cardiovascular: Negative for chest pain, palpitations and leg swelling.  Gastrointestinal: Negative for vomiting.  Musculoskeletal: Positive for back pain and myalgias.  Skin: Negative for rash.  Neurological: Negative for loss of consciousness and headaches.       Objective:    Physical  Exam  Constitutional: He is oriented to person, place, and time. He appears well-developed and well-nourished. No distress.  HENT:  Head: Normocephalic and atraumatic.  Eyes: Conjunctivae are normal.  Neck: Normal range of motion. No thyromegaly present.  Cardiovascular: Normal rate and regular rhythm.   Pulmonary/Chest: Effort normal and breath sounds normal. He has no wheezes.  Abdominal: Soft. Bowel sounds are normal. There is no tenderness.  Musculoskeletal: He exhibits no edema or deformity.  Neurological: He is alert and oriented to person, place, and time.  Skin: Skin is warm and dry. He is not diaphoretic.  Psychiatric: He has a normal mood and affect.    BP 122/70 (BP Location: Left Arm, Patient Position: Sitting, Cuff Size: Large)   Pulse (!) 56   Temp 97.7 F (36.5 C) (Oral)   Ht 6\' 1"  (1.854 m)   Wt 264 lb 9.6 oz (120 kg)   SpO2 95% Comment: RA  BMI 34.91 kg/m  Wt Readings from Last 3 Encounters:  03/26/16 264 lb 9.6 oz (120 kg)  12/16/15 270 lb 12.8 oz (122.8 kg)  09/23/15 264 lb 12.8 oz (120.1 kg)      Immunization History  Administered Date(s) Administered  . Influenza Split 10/02/2015  . Influenza,inj,Quad PF,36+ Mos 09/14/2012, 09/25/2013, 03/07/2015  . Pneumococcal Conjugate-13 09/05/2012  . Pneumococcal Polysaccharide-23 08/30/2014  . Tdap 09/05/2012    Health Maintenance  Topic Date Due  . COLONOSCOPY  09/08/2017  . TETANUS/TDAP  09/06/2022  . INFLUENZA VACCINE  Completed  . Hepatitis C Screening  Completed  . PNA vac Low Risk Adult  Completed      Lab Results  Component Value Date   WBC 7.4 03/26/2016   HGB 15.1 03/26/2016   HCT 45.0 03/26/2016   PLT 246.0 03/26/2016   GLUCOSE 98 03/26/2016   CHOL 227 (H) 03/26/2016   TRIG 142.0 03/26/2016   HDL 37.00 (L) 03/26/2016   LDLDIRECT 168.5 03/29/2006   LDLCALC 162 (H) 03/26/2016   ALT 35 03/26/2016   AST 29 03/26/2016   NA 139 03/26/2016   K 4.2 03/26/2016   CL 103 03/26/2016   CREATININE 1.34 03/26/2016   BUN 15 03/26/2016   CO2 29 03/26/2016   TSH 3.87 03/26/2016   PSA 1.47 02/23/2014   INR 1.39 11/14/2010   HGBA1C 5.9 03/26/2016    Lab Results  Component Value Date   TSH 3.87 03/26/2016   Lab Results  Component Value Date   WBC 7.4 03/26/2016   HGB 15.1 03/26/2016   HCT 45.0 03/26/2016   MCV 93.2 03/26/2016   PLT 246.0 03/26/2016   Lab Results  Component Value Date   NA 139 03/26/2016   K 4.2 03/26/2016   CO2 29 03/26/2016   GLUCOSE 98 03/26/2016   BUN 15 03/26/2016   CREATININE 1.34 03/26/2016   BILITOT 0.6 03/26/2016   ALKPHOS 61 03/26/2016   AST 29 03/26/2016   ALT 35 03/26/2016   PROT 7.2 03/26/2016   ALBUMIN 4.1 03/26/2016   CALCIUM 10.2 03/26/2016   ANIONGAP 6 09/27/2014   GFR 55.87 (L) 03/26/2016   Lab Results  Component Value Date   CHOL 227 (H) 03/26/2016   Lab Results  Component Value Date   HDL 37.00 (L) 03/26/2016   Lab Results  Component Value Date   LDLCALC 162 (H) 03/26/2016   Lab Results  Component Value Date   TRIG 142.0 03/26/2016   Lab Results  Component Value Date   CHOLHDL 6 03/26/2016   Lab  Results  Component Value Date   HGBA1C 5.9 03/26/2016         Assessment & Plan:   Problem List Items Addressed This Visit    Obesity    Encouraged DASH diet, decrease po intake and increase exercise as tolerated. Needs 7-8 hours of sleep nightly. Avoid trans fats, eat small, frequent meals every 4-5 hours with lean proteins, complex carbs and healthy fats. Minimize simple carbs,       GERD    Avoid offending  foods, start probiotics. Do not eat large meals in late evening and consider raising head of bed. Does complain of frequent sore throats. Wonder if reflux is playing a role      Hyperlipidemia, mixed    Encouraged heart healthy diet, increase exercise, avoid trans fats, consider a krill oil cap daily      Relevant Orders   Lipid panel (Completed)   Essential hypertension, benign    Well controlled, no changes to meds. Encouraged heart healthy diet such as the DASH diet and exercise as tolerated.       Relevant Orders   Comprehensive metabolic panel (Completed)   TSH (Completed)   Magnesium (Completed)   Gout    No flares in 2 years      Relevant Orders   Uric acid (Completed)   Vitamin D deficiency    Taking 5000 mg daily check level      Constipation    Encouraged increased hydration and fiber in diet. Daily probiotics. If bowels not moving can use MOM 2 tbls po in 4 oz of warm prune juice by mouth every 2-3 days. If no results then repeat in 4 hours with  Dulcolax suppository pr, may repeat again in 4 more hours as needed. Seek care if symptoms worsen. Consider daily Miralax and/or Dulcolax if symptoms persist. Add Benefiber bid      Preventative health care - Primary    Patient encouraged to maintain heart healthy diet, regular exercise, adequate sleep. Consider daily probiotics. Take medications as prescribed. Given and reviewed copy of ACP documents from [facility] Secretary of State and encouraged to complete and return      Relevant Orders   Hemoglobin A1c (Completed)   CBC (Completed)   Comprehensive metabolic panel (Completed)   Lipid panel (Completed)   TSH (Completed)   VITAMIN D 25 Hydroxy (Vit-D Deficiency, Fractures) (Completed)   Magnesium (Completed)   Uric acid (Completed)   Pharyngitis    Check strep test today, asymptomatic presently but has frequent infections      Relevant Orders   POCT rapid strep A (Completed)   Low back pain    Encouraged moist heat  and gentle stretching as tolerated. May try NSAIDs and prescription meds as directed and report if symptoms worsen or seek immediate care, try topical treatments prn      Cataracts, bilateral    Dr Pierre Bali, opthamology will have right cataract removed later this year then the left       Arthritis of both knees    Stay active and use topical meds      Hearing loss of both ears    Has tried 2 hearing aides in past without good results. Consider [employer] for further evaluation. He will let us know.      Hyperglycemia    minimize simple carbs. Increase exercise as tolerated.       Relevant Orders   Hemoglobin A1c (Completed)      I have discontinued Mr. Daily  clindamycin. I am also having him maintain his atorvastatin, fluticasone, meloxicam, alum hydroxide-mag trisilicate, aspirin, sertraline, cyclobenzaprine, hydrocortisone, vitamin C, omeprazole, LORazepam, atenolol, and losartan.  No orders of the defined types were placed in this encounter.    Penni Homans, MD

## 2016-03-26 NOTE — Assessment & Plan Note (Signed)
Has tried 2 hearing aides in past without good results. Consider [employer] for further evaluation. He will let us know.

## 2016-03-26 NOTE — Assessment & Plan Note (Signed)
Taking 5000 mg daily check level

## 2016-03-26 NOTE — Patient Instructions (Addendum)
Encouraged to incorporate Benofiber into your diet twice per day (morning & evening) Preventive Care 65 Years and Older, Male Preventive care refers to lifestyle choices and visits with your health care provider that can promote health and wellness. What does preventive care include?  A yearly physical exam. This is also called an annual well check.  Dental exams once or twice a year.  Routine eye exams. Ask your health care provider how often you should have your eyes checked.  Personal lifestyle choices, including:  Daily care of your teeth and gums.  Regular physical activity.  Eating a healthy diet.  Avoiding tobacco and drug use.  Limiting alcohol use.  Practicing safe sex.  Taking low doses of aspirin every day.  Taking vitamin and mineral supplements as recommended by your health care provider. What happens during an annual well check? The services and screenings done by your health care provider during your annual well check will depend on your age, overall health, lifestyle risk factors, and family history of disease. Counseling  Your health care provider may ask you questions about your:  Alcohol use.  Tobacco use.  Drug use.  Emotional well-being.  Home and relationship well-being.  Sexual activity.  Eating habits.  History of falls.  Memory and ability to understand (cognition).  Work and work Statistician. Screening  You may have the following tests or measurements:  Height, weight, and BMI.  Blood pressure.  Lipid and cholesterol levels. These may be checked every 5 years, or more frequently if you are over 61 years old.  Skin check.  Lung cancer screening. You may have this screening every year starting at age 27 if you have a 30-pack-year history of smoking and currently smoke or have quit within the past 15 years.  Fecal occult blood test (FOBT) of the stool. You may have this test every year starting at age 32.  Flexible  sigmoidoscopy or colonoscopy. You may have a sigmoidoscopy every 5 years or a colonoscopy every 10 years starting at age 81.  Prostate cancer screening. Recommendations will vary depending on your family history and other risks.  Hepatitis C blood test.  Hepatitis B blood test.  Sexually transmitted disease (STD) testing.  Diabetes screening. This is done by checking your blood sugar (glucose) after you have not eaten for a while (fasting). You may have this done every 1-3 years.  Abdominal aortic aneurysm (AAA) screening. You may need this if you are a current or former smoker.  Osteoporosis. You may be screened starting at age 65 if you are at high risk. Talk with your health care provider about your test results, treatment options, and if necessary, the need for more tests. Vaccines  Your health care provider may recommend certain vaccines, such as:  Influenza vaccine. This is recommended every year.  Tetanus, diphtheria, and acellular pertussis (Tdap, Td) vaccine. You may need a Td booster every 10 years.  Varicella vaccine. You may need this if you have not been vaccinated.  Zoster vaccine. You may need this after age 62.  Measles, mumps, and rubella (MMR) vaccine. You may need at least one dose of MMR if you were born in 1957 or later. You may also need a second dose.  Pneumococcal 13-valent conjugate (PCV13) vaccine. One dose is recommended after age 53.  Pneumococcal polysaccharide (PPSV23) vaccine. One dose is recommended after age 74.  Meningococcal vaccine. You may need this if you have certain conditions.  Hepatitis A vaccine. You may need this if  you have certain conditions or if you travel or work in places where you may be exposed to hepatitis A.  Hepatitis B vaccine. You may need this if you have certain conditions or if you travel or work in places where you may be exposed to hepatitis B.  Haemophilus influenzae type b (Hib) vaccine. You may need this if you  have certain risk factors. Talk to your health care provider about which screenings and vaccines you need and how often you need them. This information is not intended to replace advice given to you by your health care provider. Make sure you discuss any questions you have with your health care provider. Document Released: 01/25/2015 Document Revised: 09/18/2015 Document Reviewed: 10/30/2014 Elsevier Interactive Patient Education  2017 [employer].

## 2016-03-26 NOTE — Assessment & Plan Note (Signed)
Avoid offending foods, start probiotics. Do not eat large meals in late evening and consider raising head of bed. Does complain of frequent sore throats. Wonder if reflux is playing a role

## 2016-03-26 NOTE — Progress Notes (Signed)
Pre visit review using our clinic review tool, if applicable. No additional management support is needed unless otherwise documented below in the visit note. 

## 2016-03-26 NOTE — Assessment & Plan Note (Addendum)
Encouraged moist heat and gentle stretching as tolerated. May try NSAIDs and prescription meds as directed and report if symptoms worsen or seek immediate care, try topical treatments prn

## 2016-03-29 ENCOUNTER — Encounter: Payer: Self-pay | Admitting: Family Medicine

## 2016-03-29 DIAGNOSIS — R739 Hyperglycemia, unspecified: Secondary | ICD-10-CM | POA: Insufficient documentation

## 2016-03-29 HISTORY — DX: Hyperglycemia, unspecified: R73.9

## 2016-03-29 NOTE — Assessment & Plan Note (Signed)
minimize simple carbs. Increase exercise as tolerated.  

## 2016-03-30 ENCOUNTER — Other Ambulatory Visit: Payer: Self-pay | Admitting: Family Medicine

## 2016-03-30 MED ORDER — ATORVASTATIN CALCIUM 40 MG PO TABS: 20.0000 mg | ORAL_TABLET | Freq: Two times a day (BID) | ORAL | 6 refills | Status: DC

## 2016-03-31 ENCOUNTER — Telehealth: Payer: Self-pay | Admitting: Family Medicine

## 2016-03-31 NOTE — Telephone Encounter (Signed)
Caller name: Lisabeth Devoid at [place] Can be reached: [phone]  Reason for call: pharmacy called to check on dosing as most often med is 1xdaily at bedtime and RX written for 1/2 tab 2x day. Please call.

## 2016-04-01 MED ORDER — ATORVASTATIN CALCIUM 40 MG PO TABS: 40.0000 mg | ORAL_TABLET | Freq: Every day | ORAL | 5 refills | Status: DC

## 2016-04-01 NOTE — Telephone Encounter (Signed)
I assume this phone call is about the Lipitor. I am not sure how this dose was sent in this way but I agree let's change sig to 1 tab po qhs.

## 2016-04-01 NOTE — Telephone Encounter (Signed)
Correct, this was on the Lipitor. I apologize.

## 2016-04-01 NOTE — Telephone Encounter (Signed)
Rx re-sent for 40mg  at bedtime.

## 2016-04-19 ENCOUNTER — Other Ambulatory Visit: Payer: Self-pay | Admitting: Family Medicine

## 2016-04-20 ENCOUNTER — Other Ambulatory Visit: Payer: Self-pay | Admitting: Family Medicine

## 2016-07-02 ENCOUNTER — Other Ambulatory Visit: Payer: Self-pay | Admitting: Family Medicine

## 2016-07-16 ENCOUNTER — Ambulatory Visit (INDEPENDENT_AMBULATORY_CARE_PROVIDER_SITE_OTHER): Payer: Medicare Other | Admitting: Family Medicine

## 2016-07-16 ENCOUNTER — Encounter: Payer: Self-pay | Admitting: Family Medicine

## 2016-07-16 VITALS — BP 132/60 | HR 67 | Temp 98.4°F | Resp 16 | Ht 73.0 in | Wt 262.4 lb

## 2016-07-16 DIAGNOSIS — J029 Acute pharyngitis, unspecified: Secondary | ICD-10-CM

## 2016-07-16 DIAGNOSIS — I10 Essential (primary) hypertension: Secondary | ICD-10-CM | POA: Diagnosis not present

## 2016-07-16 DIAGNOSIS — R739 Hyperglycemia, unspecified: Secondary | ICD-10-CM

## 2016-07-16 DIAGNOSIS — E782 Mixed hyperlipidemia: Secondary | ICD-10-CM

## 2016-07-16 DIAGNOSIS — E6609 Other obesity due to excess calories: Secondary | ICD-10-CM

## 2016-07-16 LAB — POCT RAPID STREP A (OFFICE): Rapid Strep A Screen: NEGATIVE

## 2016-07-16 MED ORDER — SULFAMETHOXAZOLE-TRIMETHOPRIM 800-160 MG PO TABS: 1.0000 | ORAL_TABLET | Freq: Two times a day (BID) | ORAL | 0 refills | Status: DC

## 2016-07-16 MED FILL — SULFAMETHOXAZOLE/TMP DS TAB: 800-160 | 10 days supply | Qty: 20 | Fill #0

## 2016-07-16 NOTE — Patient Instructions (Addendum)
Encouraged increased rest and hydration, add probiotics, zinc such as Coldeze or Xicam. Treat fevers as needed Aged garlic, vitamin c 3419 mg, Mucinex twice daily  Pharyngitis Pharyngitis is redness, pain, and swelling (inflammation) of your pharynx. What are the causes? Pharyngitis is usually caused by infection. Most of the time, these infections are from viruses (viral) and are part of a cold. However, sometimes pharyngitis is caused by bacteria (bacterial). Pharyngitis can also be caused by allergies. Viral pharyngitis may be spread from person to person by coughing, sneezing, and personal items or utensils (cups, forks, spoons, toothbrushes). Bacterial pharyngitis may be spread from person to person by more intimate contact, such as kissing. What are the signs or symptoms? Symptoms of pharyngitis include:  Sore throat.  Tiredness (fatigue).  Low-grade fever.  Headache.  Joint pain and muscle aches.  Skin rashes.  Swollen lymph nodes.  Plaque-like film on throat or tonsils (often seen with bacterial pharyngitis).  How is this diagnosed? Your health care provider will ask you questions about your illness and your symptoms. Your medical history, along with a physical exam, is often all that is needed to diagnose pharyngitis. Sometimes, a rapid strep test is done. Other lab tests may also be done, depending on the suspected cause. How is this treated? Viral pharyngitis will usually get better in 3-4 days without the use of medicine. Bacterial pharyngitis is treated with medicines that kill germs (antibiotics). Follow these instructions at home:  Drink enough water and fluids to keep your urine clear or pale yellow.  Only take over-the-counter or prescription medicines as directed by your health care provider: ? If you are prescribed antibiotics, make sure you finish them even if you start to feel better. ? Do not take aspirin.  Get lots of rest.  Gargle with 8 oz of salt  water ( tsp of salt per 1 qt of water) as often as every 1-2 hours to soothe your throat.  Throat lozenges (if you are not at risk for choking) or sprays may be used to soothe your throat. Contact a health care provider if:  You have large, tender lumps in your neck.  You have a rash.  You cough up green, yellow-brown, or bloody spit. Get help right away if:  Your neck becomes stiff.  You drool or are unable to swallow liquids.  You vomit or are unable to keep medicines or liquids down.  You have severe pain that does not go away with the use of recommended medicines.  You have trouble breathing (not caused by a stuffy nose). This information is not intended to replace advice given to you by your health care provider. Make sure you discuss any questions you have with your health care provider. Document Released: 12/29/2004 Document Revised: 06/06/2015 Document Reviewed: 09/05/2012 Elsevier Interactive Patient Education  2017 [employer].

## 2016-07-16 NOTE — Progress Notes (Signed)
Patient ID: David [name], male   DOB: 1945-02-06, 71 y.o.   MRN: [id number]     Subjective:  I acted as a Education administrator for Dr. Charlett Blake.  Guerry Bruin, [place].   Patient ID: David [name], male    DOB: 1945/09/21, 71 y.o.   MRN: [id number]  Chief Complaint  Patient presents with  . Sore Throat    Sore Throat   This is a new problem. Episode onset: Monday. Associated symptoms include congestion, coughing, ear pain and headaches. Pertinent negatives include no shortness of breath or vomiting. Associated symptoms comments: Right ear pian, runny nose. He has tried acetaminophen for the symptoms.     Patient is in today for sore throat, malaise and fatigue. No fevers or chills. No polyuria or polydipsia. Denies CP/palp/SOB/fevers/GI or GU c/o. Taking meds as prescribed   Patient Care Team: Mosie Lukes, MD as PCP - General (Family Medicine) Linward Natal, MD as Consulting Physician (Ophthalmology) Danella Sensing, MD as Consulting Physician (Dermatology)   Past Medical History:  Diagnosis Date  . Abnormal liver function 03/18/2013  . Anxiety   . Arthritis of both knees 03/26/2016  . Atrial fibrillation ([place])   . Back pain   . BCC (basal cell carcinoma of skin)    under right eye and right ear  . Cataracts, bilateral 03/26/2016  . Diverticulitis   . Diverticulosis   . GERD (gastroesophageal reflux disease)   . Gout   . Hearing loss of both ears 03/26/2016  . Hyperglycemia 03/29/2016  . Hyperlipidemia   . Hypertension   . IBS (irritable bowel syndrome) 03/18/2013  . Internal hemorrhoids   . LAFB (left anterior fascicular block)   . Low back pain 03/26/2016  . Medicare annual wellness visit, subsequent 03/12/2014  . Obesity 11/25/2007   Qualifier: Diagnosis of  By: Lenna Gilford MD, Deborra Medina   . Other malaise and fatigue 03/13/2013  . Premature ventricular complex   . Preventative health care 03/07/2015  . Rectal irritation 09/18/2012  . Tinea corporis 03/13/2013  . Unspecified constipation 09/25/2013      Past Surgical History:  Procedure Laterality Date  . COLON SURGERY  2009   segmental sigmoid resection  . COLONOSCOPY    . NOSE SURGERY    . SKIN BIOPSY      Family History  Problem Relation Age of Onset  . Heart disease Mother   . Cancer Mother        colon, breast  . Hypertension Mother   . Stroke Mother   . Heart disease Father        pacemaker  . Aortic aneurysm Father   . Hypertension Father   . Heart disease Sister   . Atrial fibrillation Sister   . Obesity Sister   . Sleep apnea Sister   . Heart attack Brother   . Other Brother        muscle disease  . Arthritis Brother   . Stroke Brother   . Cancer Maternal Grandmother        ?  Marland Kitchen Heart attack Maternal Grandmother   . Diabetes Maternal Grandmother   . Cancer Maternal Grandfather        hodgin's lymphoma  . Heart attack Paternal Grandmother   . Anxiety disorder Paternal Grandmother   . Pneumonia Paternal Grandfather   . Heart attack Brother   . Atrial fibrillation Brother   . Heart attack Brother   . Hypertension Brother   . Hyperlipidemia Brother   . Heart  attack Brother   . Other Brother        heart valve operation    Social History   Social History  . Marital status: Married    Spouse name: N/A  . Number of children: 3  . Years of education: N/A   Occupational History  . [employer]   Social History Main Topics  . Smoking status: Former Smoker    Packs/day: 1.00    Years: 6.00    Types: Cigarettes    Start date: 01/12/1969  . Smokeless tobacco: Never Used  . Alcohol use Yes     Comment: 2 drinks every night  . Drug use: No  . Sexual activity: Yes   Other Topics Concern  . Not on file   Social History Narrative  . No narrative on file    Outpatient Medications Prior to Visit  Medication Sig Dispense Refill  . alum hydroxide-mag trisilicate (GAVISCON) 50-53 MG CHEW Chew by mouth.    . Ascorbic Acid (VITAMIN C) 1000 MG tablet Take 1,000 mg by mouth daily.    Marland Kitchen aspirin 81 MG  tablet Take 81 mg by mouth daily as needed.     Marland Kitchen atenolol (TENORMIN) 25 MG tablet TAKE ONE TABLET BY MOUTH TWICE A DAY 60 tablet 5  . atorvastatin (LIPITOR) 40 MG tablet Take 1 tablet (40 mg total) by mouth at bedtime. 30 tablet 5  . cyclobenzaprine (FLEXERIL) 5 MG tablet Take 1 tablet (5 mg total) by mouth 3 (three) times daily as needed for muscle spasms. 30 tablet 1  . fluticasone (FLONASE) 50 MCG/ACT nasal spray Place 2 sprays into the nose daily as needed. For congestion    . hydrocortisone (PROCTOZONE-HC) 2.5 % rectal cream Place 1 application rectally as needed. 30 g 2  . LORazepam (ATIVAN) 1 MG tablet TAKE 1 TABLET BY MOUTH EVERY 8 HOURS AS NEEDED FOR ANXIETY OR SLEEP 90 tablet 1  . losartan (COZAAR) 50 MG tablet TAKE ONE TABLET BY MOUTH DAILY 30 tablet 11  . meloxicam (MOBIC) 15 MG tablet PRN As directed    . omeprazole (PRILOSEC) 20 MG capsule TAKE ONE CAPSULE BY MOUTH ONCE DAILY 30 capsule 6  . sertraline (ZOLOFT) 100 MG tablet TAKE 1 TABLET BY MOUTH DAILY 30 tablet 1   No facility-administered medications prior to visit.     Allergies  Allergen Reactions  . Cefaclor Hives  . Cephalosporins Hives  . Penicillins Rash    Mild maculopapular rash    Review of Systems  Constitutional: Negative for fever and malaise/fatigue.  HENT: Positive for congestion and ear pain.   Eyes: Negative for blurred vision.  Respiratory: Positive for cough and sputum production. Negative for shortness of breath.   Cardiovascular: Negative for chest pain, palpitations and leg swelling.  Gastrointestinal: Negative for vomiting.  Musculoskeletal: Negative for back pain.  Skin: Negative for rash.  Neurological: Positive for headaches. Negative for loss of consciousness.       Objective:    Physical Exam  Constitutional: He appears well-developed and well-nourished. No distress.  HENT:  Head: Normocephalic and atraumatic.  Eyes: Conjunctivae are normal.  Neck: Normal range of motion. No  thyromegaly present.  Cardiovascular: Normal rate and regular rhythm.   Pulmonary/Chest: Effort normal. He has no wheezes.  Abdominal: Soft. Bowel sounds are normal. There is no tenderness.  Musculoskeletal: Normal range of motion. He exhibits no edema or deformity.  Neurological: He is alert.  Skin: Skin is warm and dry. He is not  diaphoretic.  Psychiatric: He has a normal mood and affect.    BP 132/60 (BP Location: Left Arm, Cuff Size: Large)   Pulse 67   Temp 98.4 F (36.9 C) (Oral)   Resp 16   Ht 6\' 1"  (1.854 m)   Wt 262 lb 6.4 oz (119 kg)   SpO2 97%   BMI 34.62 kg/m  Wt Readings from Last 3 Encounters:  07/16/16 262 lb 6.4 oz (119 kg)  03/26/16 264 lb 9.6 oz (120 kg)  12/16/15 270 lb 12.8 oz (122.8 kg)   BP Readings from Last 3 Encounters:  07/16/16 132/60  03/26/16 122/70  12/16/15 128/70     Immunization History  Administered Date(s) Administered  . Influenza Split 10/02/2015  . Influenza,inj,Quad PF,36+ Mos 09/14/2012, 09/25/2013, 03/07/2015  . Pneumococcal Conjugate-13 09/05/2012  . Pneumococcal Polysaccharide-23 08/30/2014  . Tdap 09/05/2012    Health Maintenance  Topic Date Due  . INFLUENZA VACCINE  08/12/2016  . COLONOSCOPY  09/08/2017  . TETANUS/TDAP  09/06/2022  . Hepatitis C Screening  Completed  . PNA vac Low Risk Adult  Completed    Lab Results  Component Value Date   WBC 7.4 03/26/2016   HGB 15.1 03/26/2016   HCT 45.0 03/26/2016   PLT 246.0 03/26/2016   GLUCOSE 98 03/26/2016   CHOL 227 (H) 03/26/2016   TRIG 142.0 03/26/2016   HDL 37.00 (L) 03/26/2016   LDLDIRECT 168.5 03/29/2006   LDLCALC 162 (H) 03/26/2016   ALT 35 03/26/2016   AST 29 03/26/2016   NA 139 03/26/2016   K 4.2 03/26/2016   CL 103 03/26/2016   CREATININE 1.34 03/26/2016   BUN 15 03/26/2016   CO2 29 03/26/2016   TSH 3.87 03/26/2016   PSA 1.47 02/23/2014   INR 1.39 11/14/2010   HGBA1C 5.9 03/26/2016    Lab Results  Component Value Date   TSH 3.87 03/26/2016    Lab Results  Component Value Date   WBC 7.4 03/26/2016   HGB 15.1 03/26/2016   HCT 45.0 03/26/2016   MCV 93.2 03/26/2016   PLT 246.0 03/26/2016   Lab Results  Component Value Date   NA 139 03/26/2016   K 4.2 03/26/2016   CO2 29 03/26/2016   GLUCOSE 98 03/26/2016   BUN 15 03/26/2016   CREATININE 1.34 03/26/2016   BILITOT 0.6 03/26/2016   ALKPHOS 61 03/26/2016   AST 29 03/26/2016   ALT 35 03/26/2016   PROT 7.2 03/26/2016   ALBUMIN 4.1 03/26/2016   CALCIUM 10.2 03/26/2016   ANIONGAP 6 09/27/2014   GFR 55.87 (L) 03/26/2016   Lab Results  Component Value Date   CHOL 227 (H) 03/26/2016   Lab Results  Component Value Date   HDL 37.00 (L) 03/26/2016   Lab Results  Component Value Date   LDLCALC 162 (H) 03/26/2016   Lab Results  Component Value Date   TRIG 142.0 03/26/2016   Lab Results  Component Value Date   CHOLHDL 6 03/26/2016   Lab Results  Component Value Date   HGBA1C 5.9 03/26/2016         Assessment & Plan:   Problem List Items Addressed This Visit    Obesity    Encouraged DASH diet, decrease po intake and increase exercise as tolerated. Needs 7-8 hours of sleep nightly. Avoid trans fats, eat small, frequent meals every 4-5 hours with lean proteins, complex carbs and healthy fats. Minimize simple carbs, bariatric referral      Hyperlipidemia, mixed  Encouraged heart healthy diet, increase exercise, avoid trans fats, consider a krill oil cap daily      Essential hypertension, benign    Well controlled, no changes to meds. Encouraged heart healthy diet such as the DASH diet and exercise as tolerated.       Pharyngitis    Pharyngitis POCT strep negative. Given his current symptoms and respiratory symptoms and ear pain will treat with Bactrim DS Encouraged increased rest and hydration, add probiotics, zinc such as Coldeze or Xicam. Treat fevers as needed      Hyperglycemia    hgba1c acceptable, minimize simple carbs. Increase exercise as  tolerated.        Other Visit Diagnoses    Sore throat    -  Primary   Relevant Orders   POCT rapid strep A (Completed)   Culture, Group A Strep      I am having Mr. Batson start on sulfamethoxazole-trimethoprim. I am also having him maintain his fluticasone, meloxicam, alum hydroxide-mag trisilicate, aspirin, cyclobenzaprine, hydrocortisone, vitamin C, omeprazole, LORazepam, atorvastatin, sertraline, losartan, and atenolol.  Meds ordered this encounter  Medications  . sulfamethoxazole-trimethoprim (BACTRIM DS,SEPTRA DS) 800-160 MG tablet    Sig: Take 1 tablet by mouth 2 (two) times daily.    Dispense:  20 tablet    Refill:  0    CMA served as scribe during this visit. History, Physical and Plan performed by medical provider. Documentation and orders reviewed and attested to.  Penni Homans, MD

## 2016-07-19 LAB — CULTURE, GROUP A STREP

## 2016-07-19 NOTE — Assessment & Plan Note (Signed)
Well controlled, no changes to meds. Encouraged heart healthy diet such as the DASH diet and exercise as tolerated.  °

## 2016-07-19 NOTE — Assessment & Plan Note (Addendum)
Pharyngitis POCT strep negative. Given his current symptoms and respiratory symptoms and ear pain will treat with Bactrim DS Encouraged increased rest and hydration, add probiotics, zinc such as Coldeze or Xicam. Treat fevers as needed

## 2016-07-19 NOTE — Assessment & Plan Note (Signed)
Encouraged DASH diet, decrease po intake and increase exercise as tolerated. Needs 7-8 hours of sleep nightly. Avoid trans fats, eat small, frequent meals every 4-5 hours with lean proteins, complex carbs and healthy fats. Minimize simple carbs, bariatric referral 

## 2016-07-19 NOTE — Assessment & Plan Note (Signed)
Encouraged heart healthy diet, increase exercise, avoid trans fats, consider a krill oil cap daily 

## 2016-07-19 NOTE — Assessment & Plan Note (Signed)
hgba1c acceptable, minimize simple carbs. Increase exercise as tolerated.  

## 2016-08-12 ENCOUNTER — Other Ambulatory Visit: Payer: Self-pay | Admitting: Family Medicine

## 2016-08-12 DIAGNOSIS — E782 Mixed hyperlipidemia: Secondary | ICD-10-CM

## 2016-08-12 DIAGNOSIS — N289 Disorder of kidney and ureter, unspecified: Secondary | ICD-10-CM

## 2016-08-12 DIAGNOSIS — I4821 Permanent atrial fibrillation: Secondary | ICD-10-CM

## 2016-08-12 DIAGNOSIS — K760 Fatty (change of) liver, not elsewhere classified: Secondary | ICD-10-CM

## 2016-08-12 DIAGNOSIS — E559 Vitamin D deficiency, unspecified: Secondary | ICD-10-CM

## 2016-08-12 NOTE — Telephone Encounter (Signed)
He can have 90 with 1 rf to hold him til appt if he completes UDS and contract

## 2016-08-12 NOTE — Telephone Encounter (Signed)
[place] requesting refill on lorazepam   Last written: 12/25/15 Last ov: 07/16/16 Next ov: 09/29/16 Contract: not on file UDS: not on file

## 2016-08-13 NOTE — Telephone Encounter (Signed)
Printed/PCP signed Called the patient informed to pickup hardcopy/update UDS Contract found and documented in [place] done on 03/26/2016

## 2016-08-17 ENCOUNTER — Encounter: Payer: Self-pay | Admitting: Family Medicine

## 2016-09-12 DIAGNOSIS — D1720 Benign lipomatous neoplasm of skin and subcutaneous tissue of unspecified limb: Secondary | ICD-10-CM

## 2016-09-12 HISTORY — DX: Benign lipomatous neoplasm of skin and subcutaneous tissue of unspecified limb: D17.20

## 2016-09-24 ENCOUNTER — Ambulatory Visit: Payer: Self-pay | Admitting: Family Medicine

## 2016-09-29 ENCOUNTER — Encounter: Payer: Self-pay | Admitting: Family Medicine

## 2016-09-29 ENCOUNTER — Ambulatory Visit (INDEPENDENT_AMBULATORY_CARE_PROVIDER_SITE_OTHER): Payer: Medicare Other | Admitting: Family Medicine

## 2016-09-29 VITALS — BP 122/68 | HR 56 | Temp 98.0°F | Resp 18 | Wt 263.8 lb

## 2016-09-29 DIAGNOSIS — M1A9XX0 Chronic gout, unspecified, without tophus (tophi): Secondary | ICD-10-CM

## 2016-09-29 DIAGNOSIS — D1720 Benign lipomatous neoplasm of skin and subcutaneous tissue of unspecified limb: Secondary | ICD-10-CM

## 2016-09-29 DIAGNOSIS — R739 Hyperglycemia, unspecified: Secondary | ICD-10-CM

## 2016-09-29 DIAGNOSIS — E559 Vitamin D deficiency, unspecified: Secondary | ICD-10-CM

## 2016-09-29 DIAGNOSIS — F418 Other specified anxiety disorders: Secondary | ICD-10-CM

## 2016-09-29 DIAGNOSIS — E6609 Other obesity due to excess calories: Secondary | ICD-10-CM

## 2016-09-29 DIAGNOSIS — Z23 Encounter for immunization: Secondary | ICD-10-CM | POA: Diagnosis not present

## 2016-09-29 DIAGNOSIS — R252 Cramp and spasm: Secondary | ICD-10-CM | POA: Diagnosis not present

## 2016-09-29 DIAGNOSIS — H269 Unspecified cataract: Secondary | ICD-10-CM | POA: Insufficient documentation

## 2016-09-29 DIAGNOSIS — D1739 Benign lipomatous neoplasm of skin and subcutaneous tissue of other sites: Secondary | ICD-10-CM | POA: Diagnosis not present

## 2016-09-29 DIAGNOSIS — R351 Nocturia: Secondary | ICD-10-CM | POA: Insufficient documentation

## 2016-09-29 DIAGNOSIS — E782 Mixed hyperlipidemia: Secondary | ICD-10-CM

## 2016-09-29 DIAGNOSIS — I10 Essential (primary) hypertension: Secondary | ICD-10-CM

## 2016-09-29 DIAGNOSIS — M1990 Unspecified osteoarthritis, unspecified site: Secondary | ICD-10-CM

## 2016-09-29 HISTORY — DX: Unspecified cataract: H26.9

## 2016-09-29 LAB — MAGNESIUM: MAGNESIUM: 2 mg/dL (ref 1.5–2.5)

## 2016-09-29 LAB — COMPREHENSIVE METABOLIC PANEL
ALBUMIN: 3.9 g/dL (ref 3.5–5.2)
ALK PHOS: 64 U/L (ref 39–117)
ALT: 28 U/L (ref 0–53)
AST: 24 U/L (ref 0–37)
BUN: 15 mg/dL (ref 6–23)
CALCIUM: 10 mg/dL (ref 8.4–10.5)
CHLORIDE: 102 meq/L (ref 96–112)
CO2: 29 mEq/L (ref 19–32)
Creatinine, Ser: 1.23 mg/dL (ref 0.40–1.50)
GFR: 61.59 mL/min (ref >60.00)
Glucose, Bld: 91 mg/dL (ref 70–99)
POTASSIUM: 4.1 meq/L (ref 3.5–5.1)
SODIUM: 137 meq/L (ref 135–145)
TOTAL PROTEIN: 6.9 g/dL (ref 6.0–8.3)
Total Bilirubin: 0.5 mg/dL (ref 0.2–1.2)

## 2016-09-29 LAB — LIPID PANEL
CHOL/HDL RATIO: 5
Cholesterol: 202 mg/dL — ABNORMAL HIGH (ref 0–200)
HDL: 42.2 mg/dL (ref >39.00)
LDL CALC: 135 mg/dL — AB (ref 0–99)
NonHDL: 159.99
Triglycerides: 127 mg/dL (ref 0.0–149.0)
VLDL: 25.4 mg/dL (ref 0.0–40.0)

## 2016-09-29 LAB — CBC
HEMATOCRIT: 43 % (ref 39.0–52.0)
HEMOGLOBIN: 14.4 g/dL (ref 13.0–17.0)
MCHC: 33.5 g/dL (ref 30.0–36.0)
MCV: 94 fl (ref 78.0–100.0)
PLATELETS: 210 10*3/uL (ref 150.0–400.0)
RBC: 4.58 Mil/uL (ref 4.22–5.81)
RDW: 14.4 % (ref 11.5–15.5)
WBC: 6.5 10*3/uL (ref 4.0–10.5)

## 2016-09-29 LAB — TSH: TSH: 4.36 u[IU]/mL (ref 0.35–4.50)

## 2016-09-29 LAB — HEMOGLOBIN A1C: Hgb A1c MFr Bld: 5.7 % (ref 4.6–6.5)

## 2016-09-29 LAB — VITAMIN D 25 HYDROXY (VIT D DEFICIENCY, FRACTURES): VITD: 44.07 ng/mL (ref 30.00–100.00)

## 2016-09-29 LAB — URIC ACID: Uric Acid, Serum: 7.4 mg/dL (ref 4.0–7.8)

## 2016-09-29 MED ORDER — VITAMIN D-3 25 MCG (1000 UT) PO CAPS: 1.0000 | ORAL_CAPSULE | Freq: Every day | ORAL | Status: DC

## 2016-09-29 NOTE — Assessment & Plan Note (Signed)
Uses Lorazepam 1 to 1.5 tabs daily has had a lot of stress with brothers and wife sick

## 2016-09-29 NOTE — Assessment & Plan Note (Signed)
Encouraged DASH diet, decrease po intake and increase exercise as tolerated. Needs 7-8 hours of sleep nightly. Avoid trans fats, eat small, frequent meals every 4-5 hours with lean proteins, complex carbs and healthy fats. Minimize simple carbs, bariatric referral 

## 2016-09-29 NOTE — Assessment & Plan Note (Signed)
Well controlled, no changes to meds. Encouraged heart healthy diet such as the DASH diet and exercise as tolerated.  °

## 2016-09-29 NOTE — Assessment & Plan Note (Signed)
Check PSA. ?

## 2016-09-29 NOTE — Assessment & Plan Note (Signed)
No flares since quitting beer and eating less red meat. Check uric acid level.

## 2016-09-29 NOTE — Assessment & Plan Note (Addendum)
Encouraged heart healthy diet, increase exercise, avoid trans fats, consider a krill oil cap daily 

## 2016-09-29 NOTE — Progress Notes (Signed)
   Subjective:    HPI  ROS    Physical Exam

## 2016-09-29 NOTE — Assessment & Plan Note (Signed)
Right eye is up first. Dr Hal Morales will do surgery probably next march.

## 2016-09-29 NOTE — Assessment & Plan Note (Signed)
hgba1c acceptable, minimize simple carbs. Increase exercise as tolerated.  

## 2016-09-29 NOTE — Assessment & Plan Note (Signed)
Hydrate and check magnesium level

## 2016-09-29 NOTE — Assessment & Plan Note (Signed)
With intermittent low back pain and stiffness in am. Can continue to use tylenol, Meloxicam, Flexeril. Uses Hydrocodone very rarely with adequate results on a bad day

## 2016-09-29 NOTE — Patient Instructions (Addendum)
Shingrix is the new shingles shot, 2 shots over 2-6 months.   Tylenol/Acetaminophen ES 500 mg tabs, 1-2 tabs twice every day (3000 mg in 24 hours)   Hypertension Hypertension, commonly called high blood pressure, is when the force of blood pumping through the arteries is too strong. The arteries are the blood vessels that carry blood from the heart throughout the body. Hypertension forces the heart to work harder to pump blood and may cause arteries to become narrow or stiff. Having untreated or uncontrolled hypertension can cause heart attacks, strokes, kidney disease, and other problems. A blood pressure reading consists of a higher number over a lower number. Ideally, your blood pressure should be below 120/80. The first ("top") number is called the systolic pressure. It is a measure of the pressure in your arteries as your heart beats. The second ("bottom") number is called the diastolic pressure. It is a measure of the pressure in your arteries as the heart relaxes. What are the causes? The cause of this condition is not known. What increases the risk? Some risk factors for high blood pressure are under your control. Others are not. Factors you can change  Smoking.  Having type 2 diabetes mellitus, high cholesterol, or both.  Not getting enough exercise or physical activity.  Being overweight.  Having too much fat, sugar, calories, or salt (sodium) in your diet.  Drinking too much alcohol. Factors that are difficult or impossible to change  Having chronic kidney disease.  Having a family history of high blood pressure.  Age. Risk increases with age.  Race. You may be at higher risk if you are African-American.  Gender. Men are at higher risk than women before age 60. After age 16, women are at higher risk than men.  Having obstructive sleep apnea.  Stress. What are the signs or symptoms? Extremely high blood pressure (hypertensive crisis) may  cause:  Headache.  Anxiety.  Shortness of breath.  Nosebleed.  Nausea and vomiting.  Severe chest pain.  Jerky movements you cannot control (seizures).  How is this diagnosed? This condition is diagnosed by measuring your blood pressure while you are seated, with your arm resting on a surface. The cuff of the blood pressure monitor will be placed directly against the skin of your upper arm at the level of your heart. It should be measured at least twice using the same arm. Certain conditions can cause a difference in blood pressure between your right and left arms. Certain factors can cause blood pressure readings to be lower or higher than normal (elevated) for a short period of time:  When your blood pressure is higher when you are in a health care provider's office than when you are at home, this is called white coat hypertension. Most people with this condition do not need medicines.  When your blood pressure is higher at home than when you are in a health care provider's office, this is called masked hypertension. Most people with this condition may need medicines to control blood pressure.  If you have a high blood pressure reading during one visit or you have normal blood pressure with other risk factors:  You may be asked to return on a different day to have your blood pressure checked again.  You may be asked to monitor your blood pressure at home for 1 week or longer.  If you are diagnosed with hypertension, you may have other blood or imaging tests to help your health care provider understand your overall  risk for other conditions. How is this treated? This condition is treated by making healthy lifestyle changes, such as eating healthy foods, exercising more, and reducing your alcohol intake. Your health care provider may prescribe medicine if lifestyle changes are not enough to get your blood pressure under control, and if:  Your systolic blood pressure is above  130.  Your diastolic blood pressure is above 80.  Your personal target blood pressure may vary depending on your medical conditions, your age, and other factors. Follow these instructions at home: Eating and drinking  Eat a diet that is high in fiber and potassium, and low in sodium, added sugar, and fat. An example eating plan is called the DASH (Dietary Approaches to Stop Hypertension) diet. To eat this way: ? Eat plenty of fresh fruits and vegetables. Try to fill half of your plate at each meal with fruits and vegetables. ? Eat whole grains, such as whole wheat pasta, brown rice, or whole grain bread. Fill about one quarter of your plate with whole grains. ? Eat or drink low-fat dairy products, such as skim milk or low-fat yogurt. ? Avoid fatty cuts of meat, processed or cured meats, and poultry with skin. Fill about one quarter of your plate with lean proteins, such as fish, chicken without skin, beans, eggs, and tofu. ? Avoid premade and processed foods. These tend to be higher in sodium, added sugar, and fat.  Reduce your daily sodium intake. Most people with hypertension should eat less than 1,500 mg of sodium a day.  Limit alcohol intake to no more than 1 drink a day for nonpregnant women and 2 drinks a day for men. One drink equals 12 oz of beer, 5 oz of wine, or 1 oz of hard liquor. Lifestyle  Work with your health care provider to maintain a healthy body weight or to lose weight. Ask what an ideal weight is for you.  Get at least 30 minutes of exercise that causes your heart to beat faster (aerobic exercise) most days of the week. Activities may include walking, swimming, or biking.  Include exercise to strengthen your muscles (resistance exercise), such as pilates or lifting weights, as part of your weekly exercise routine. Try to do these types of exercises for 30 minutes at least 3 days a week.  Do not use any products that contain nicotine or tobacco, such as cigarettes and  e-cigarettes. If you need help quitting, ask your health care provider.  Monitor your blood pressure at home as told by your health care provider.  Keep all follow-up visits as told by your health care provider. This is important. Medicines  Take over-the-counter and prescription medicines only as told by your health care provider. Follow directions carefully. Blood pressure medicines must be taken as prescribed.  Do not skip doses of blood pressure medicine. Doing this puts you at risk for problems and can make the medicine less effective.  Ask your health care provider about side effects or reactions to medicines that you should watch for. Contact a health care provider if:  You think you are having a reaction to a medicine you are taking.  You have headaches that keep coming back (recurring).  You feel dizzy.  You have swelling in your ankles.  You have trouble with your vision. Get help right away if:  You develop a severe headache or confusion.  You have unusual weakness or numbness.  You feel faint.  You have severe pain in your chest or abdomen.  You vomit repeatedly.  You have trouble breathing. Summary  Hypertension is when the force of blood pumping through your arteries is too strong. If this condition is not controlled, it may put you at risk for serious complications.  Your personal target blood pressure may vary depending on your medical conditions, your age, and other factors. For most people, a normal blood pressure is less than 120/80.  Hypertension is treated with lifestyle changes, medicines, or a combination of both. Lifestyle changes include weight loss, eating a healthy, low-sodium diet, exercising more, and limiting alcohol. This information is not intended to replace advice given to you by your health care provider. Make sure you discuss any questions you have with your health care provider. Document Released: 12/29/2004 Document Revised: 11/27/2015  Document Reviewed: 11/27/2015 Elsevier Interactive Patient Education  [employer].

## 2016-09-29 NOTE — Assessment & Plan Note (Signed)
Check level today 

## 2016-10-04 NOTE — Progress Notes (Signed)
Patient ID: David [name], male   DOB: [birth year], 71 y.o.   MRN: [id number]   Subjective:    Patient ID: David [name], male    DOB: [birth year], 71 y.o.   MRN: [id number]  No chief complaint on file.   HPI Patient is in today for follow up. He feels well today. No recent febrile illness or hospitalization. His lipoma continues to be asymptomatic. No polyuria or polydipsia. Denies CP/palp/SOB/HA/congestion/fevers/GI or GU c/o. Taking meds as prescribed  Past Medical History:  Diagnosis Date  . Abnormal liver function 03/18/2013  . Anxiety   . Arthritis of both knees 03/26/2016  . Atrial fibrillation ([place])   . Back pain   . BCC (basal cell carcinoma of skin)    under right eye and right ear  . Cataract 09/29/2016  . Cataracts, bilateral 03/26/2016  . Diverticulitis   . Diverticulosis   . GERD (gastroesophageal reflux disease)   . Gout   . Hearing loss of both ears 03/26/2016  . Hyperglycemia 03/29/2016  . Hyperlipidemia   . Hypertension   . IBS (irritable bowel syndrome) 03/18/2013  . Internal hemorrhoids   . LAFB (left anterior fascicular block)   . Low back pain 03/26/2016  . Medicare annual wellness visit, subsequent 03/12/2014  . Obesity 11/25/2007   Qualifier: Diagnosis of  By: Lenna Gilford MD, Deborra Medina   . Other malaise and fatigue 03/13/2013  . Premature ventricular complex   . Preventative health care 03/07/2015  . Rectal irritation 09/18/2012  . Tinea corporis 03/13/2013  . Unspecified constipation 09/25/2013    Past Surgical History:  Procedure Laterality Date  . COLON SURGERY  2009   segmental sigmoid resection  . COLONOSCOPY    . NOSE SURGERY    . SKIN BIOPSY      Family History  Problem Relation Age of Onset  . Heart disease Mother   . Cancer Mother        colon, breast  . Hypertension Mother   . Stroke Mother   . Heart disease Father        pacemaker  . Aortic aneurysm Father   . Hypertension Father   . Heart disease Sister   . Atrial fibrillation Sister   .  Obesity Sister   . Sleep apnea Sister   . Heart attack Brother   . Other Brother        muscle disease  . Arthritis Brother   . Stroke Brother   . Cancer Maternal Grandmother        ?  Marland Kitchen Heart attack Maternal Grandmother   . Diabetes Maternal Grandmother   . Cancer Maternal Grandfather        hodgin's lymphoma  . Heart attack Paternal Grandmother   . Anxiety disorder Paternal Grandmother   . Pneumonia Paternal Grandfather   . Heart attack Brother   . Atrial fibrillation Brother   . Heart attack Brother   . Hypertension Brother   . Hyperlipidemia Brother   . Heart attack Brother   . Other Brother        heart valve operation    Social History   Social History  . Marital status: Married    Spouse name: N/A  . Number of children: 3  . Years of education: N/A   Occupational History  . [employer]   Social History Main Topics  . Smoking status: Former Smoker    Packs/day: 1.00    Years: 6.00    Types: Cigarettes  Start date: 01/12/1969  . Smokeless tobacco: Never Used  . Alcohol use Yes     Comment: 2 drinks every night  . Drug use: No  . Sexual activity: Yes   Other Topics Concern  . Not on file   Social History Narrative  . No narrative on file    Outpatient Medications Prior to Visit  Medication Sig Dispense Refill  . alum hydroxide-mag trisilicate (GAVISCON) 62-37 MG CHEW Chew by mouth.    . Ascorbic Acid (VITAMIN C) 1000 MG tablet Take 1,000 mg by mouth daily.    Marland Kitchen aspirin 81 MG tablet Take 81 mg by mouth daily as needed.     Marland Kitchen atenolol (TENORMIN) 25 MG tablet TAKE ONE TABLET BY MOUTH TWICE A DAY 60 tablet 5  . atorvastatin (LIPITOR) 40 MG tablet Take 1 tablet (40 mg total) by mouth at bedtime. 30 tablet 5  . cyclobenzaprine (FLEXERIL) 5 MG tablet Take 1 tablet (5 mg total) by mouth 3 (three) times daily as needed for muscle spasms. 30 tablet 1  . fluticasone (FLONASE) 50 MCG/ACT nasal spray Place 2 sprays into the nose daily as needed. For congestion      . hydrocortisone (PROCTOZONE-HC) 2.5 % rectal cream Place 1 application rectally as needed. 30 g 2  . LORazepam (ATIVAN) 1 MG tablet TAKE ONE TABLET BY MOUTH EVERY 8 HOURS AS NEEDED FOR ANXIETY OR SLEEP 90 tablet 1  . losartan (COZAAR) 50 MG tablet TAKE ONE TABLET BY MOUTH DAILY 30 tablet 11  . meloxicam (MOBIC) 15 MG tablet PRN As directed    . omeprazole (PRILOSEC) 20 MG capsule TAKE ONE CAPSULE BY MOUTH ONCE DAILY 30 capsule 6  . sertraline (ZOLOFT) 100 MG tablet TAKE 1 TABLET BY MOUTH DAILY 30 tablet 1  . sulfamethoxazole-trimethoprim (BACTRIM DS,SEPTRA DS) 800-160 MG tablet Take 1 tablet by mouth 2 (two) times daily. 20 tablet 0   No facility-administered medications prior to visit.     Allergies  Allergen Reactions  . Cefaclor Hives  . Cephalosporins Hives  . Penicillins Rash    Mild maculopapular rash    Review of Systems  Constitutional: Negative for fever and malaise/fatigue.  HENT: Negative for congestion.   Eyes: Negative for blurred vision.  Respiratory: Negative for shortness of breath.   Cardiovascular: Negative for chest pain, palpitations and leg swelling.  Gastrointestinal: Negative for abdominal pain, blood in stool, constipation and nausea.  Genitourinary: Negative for dysuria and frequency.  Musculoskeletal: Negative for falls.  Skin: Negative for rash.  Neurological: Negative for dizziness, loss of consciousness and headaches.  Endo/Heme/Allergies: Negative for environmental allergies.  Psychiatric/Behavioral: Negative for depression. The patient is not nervous/anxious.        Objective:    Physical Exam  Constitutional: He is oriented to person, place, and time. He appears well-developed and well-nourished. No distress.  HENT:  Head: Normocephalic and atraumatic.  Nose: Nose normal.  Eyes: Right eye exhibits no discharge. Left eye exhibits no discharge.  Neck: Normal range of motion. Neck supple.  Cardiovascular: Normal rate and regular rhythm.    No murmur heard. Pulmonary/Chest: Effort normal and breath sounds normal.  Abdominal: Soft. Bowel sounds are normal. There is no tenderness.  Musculoskeletal: He exhibits no edema.  Left axillae lipoma 3 cm soft circumscribed circular mass in axillae  Neurological: He is alert and oriented to person, place, and time.  Skin: Skin is warm and dry.  Psychiatric: He has a normal mood and affect.  Nursing note and  vitals reviewed.   BP 122/68 (BP Location: Left Arm, Patient Position: Sitting, Cuff Size: Normal)   Pulse (!) 56   Temp 98 F (36.7 C) (Oral)   Resp 18   Wt 263 lb 12.8 oz (119.7 kg)   SpO2 96%   BMI 34.80 kg/m  Wt Readings from Last 3 Encounters:  09/29/16 263 lb 12.8 oz (119.7 kg)  07/16/16 262 lb 6.4 oz (119 kg)  03/26/16 264 lb 9.6 oz (120 kg)     Lab Results  Component Value Date   WBC 6.5 09/29/2016   HGB 14.4 09/29/2016   HCT 43.0 09/29/2016   PLT 210.0 09/29/2016   GLUCOSE 91 09/29/2016   CHOL 202 (H) 09/29/2016   TRIG 127.0 09/29/2016   HDL 42.20 09/29/2016   LDLDIRECT 168.5 03/29/2006   LDLCALC 135 (H) 09/29/2016   ALT 28 09/29/2016   AST 24 09/29/2016   NA 137 09/29/2016   K 4.1 09/29/2016   CL 102 09/29/2016   CREATININE 1.23 09/29/2016   BUN 15 09/29/2016   CO2 29 09/29/2016   TSH 4.36 09/29/2016   PSA 1.47 02/23/2014   INR 1.39 11/14/2010   HGBA1C 5.7 09/29/2016    Lab Results  Component Value Date   TSH 4.36 09/29/2016   Lab Results  Component Value Date   WBC 6.5 09/29/2016   HGB 14.4 09/29/2016   HCT 43.0 09/29/2016   MCV 94.0 09/29/2016   PLT 210.0 09/29/2016   Lab Results  Component Value Date   NA 137 09/29/2016   K 4.1 09/29/2016   CO2 29 09/29/2016   GLUCOSE 91 09/29/2016   BUN 15 09/29/2016   CREATININE 1.23 09/29/2016   BILITOT 0.5 09/29/2016   ALKPHOS 64 09/29/2016   AST 24 09/29/2016   ALT 28 09/29/2016   PROT 6.9 09/29/2016   ALBUMIN 3.9 09/29/2016   CALCIUM 10.0 09/29/2016   ANIONGAP 6 09/27/2014    GFR 61.59 09/29/2016   Lab Results  Component Value Date   CHOL 202 (H) 09/29/2016   Lab Results  Component Value Date   HDL 42.20 09/29/2016   Lab Results  Component Value Date   LDLCALC 135 (H) 09/29/2016   Lab Results  Component Value Date   TRIG 127.0 09/29/2016   Lab Results  Component Value Date   CHOLHDL 5 09/29/2016   Lab Results  Component Value Date   HGBA1C 5.7 09/29/2016       Assessment & Plan:   Problem List Items Addressed This Visit    Obesity    Encouraged DASH diet, decrease po intake and increase exercise as tolerated. Needs 7-8 hours of sleep nightly. Avoid trans fats, eat small, frequent meals every 4-5 hours with lean proteins, complex carbs and healthy fats. Minimize simple carbs, bariatric referral      Osteoarthritis    With intermittent low back pain and stiffness in am. Can continue to use tylenol, Meloxicam, Flexeril. Uses Hydrocodone very rarely with adequate results on a bad day      Hyperlipidemia, mixed    Encouraged heart healthy diet, increase exercise, avoid trans fats, consider a krill oil cap daily      Relevant Orders   Lipid panel (Completed)   Essential hypertension, benign    Well controlled, no changes to meds. Encouraged heart healthy diet such as the DASH diet and exercise as tolerated.       Relevant Orders   CBC (Completed)   Comprehensive metabolic panel (Completed)   TSH (  Completed)   Gout    No flares since quitting beer and eating less red meat. Check uric acid level.       Relevant Orders   Uric acid (Completed)   Vitamin D deficiency    Check level today      Relevant Orders   VITAMIN D 25 Hydroxy (Vit-D Deficiency, Fractures) (Completed)   Depression with anxiety    Uses Lorazepam 1 to 1.5 tabs daily has had a lot of stress with brothers and wife sick      Lipoma of axilla    Nontender not changing. He will notify us if it changes or becomes symptoms      Hyperglycemia    hgba1c acceptable,  minimize simple carbs. Increase exercise as tolerated.       Relevant Orders   Hemoglobin A1c (Completed)   Cataract    Right eye is up first. Dr Hal Morales will do surgery probably next march.      Muscle cramp    Hydrate and check magnesium level      Relevant Orders   Magnesium (Completed)   Nocturia    Check PSA       Other Visit Diagnoses    Needs flu shot    -  Primary   Relevant Orders   Flu vaccine HIGH DOSE PF (Fluzone High dose) (Completed)      I have discontinued Mr. Mahar sulfamethoxazole-trimethoprim. I am also having him start on Vitamin D-3. Additionally, I am having him maintain his fluticasone, meloxicam, alum hydroxide-mag trisilicate, aspirin, cyclobenzaprine, hydrocortisone, vitamin C, omeprazole, atorvastatin, sertraline, losartan, atenolol, and LORazepam.  Meds ordered this encounter  Medications  . Cholecalciferol (VITAMIN D-3) 1000 units CAPS    Sig: Take 1 capsule (1,000 Units total) by mouth daily.    Dispense:  60 capsule     Penni Homans, MD

## 2016-10-04 NOTE — Assessment & Plan Note (Signed)
Nontender not changing. He will notify us if it changes or becomes symptoms

## 2016-10-05 ENCOUNTER — Encounter: Payer: Self-pay | Admitting: Family Medicine

## 2016-10-12 ENCOUNTER — Other Ambulatory Visit: Payer: Self-pay | Admitting: Family Medicine

## 2016-10-27 ENCOUNTER — Ambulatory Visit (INDEPENDENT_AMBULATORY_CARE_PROVIDER_SITE_OTHER): Payer: Medicare Other | Admitting: Medical

## 2016-10-27 ENCOUNTER — Encounter: Payer: Self-pay | Admitting: Medical

## 2016-10-27 VITALS — BP 130/69 | HR 60 | Temp 98.8°F | Resp 16 | Ht 73.0 in | Wt 257.2 lb

## 2016-10-27 DIAGNOSIS — R109 Unspecified abdominal pain: Secondary | ICD-10-CM

## 2016-10-27 DIAGNOSIS — Z8719 Personal history of other diseases of the digestive system: Secondary | ICD-10-CM

## 2016-10-27 DIAGNOSIS — R6883 Chills (without fever): Secondary | ICD-10-CM

## 2016-10-27 LAB — CBC WITH DIFFERENTIAL/PLATELET
BASOS ABS: 0.1 10*3/uL (ref 0.0–0.1)
BASOS PCT: 0.9 % (ref 0.0–3.0)
EOS ABS: 0.2 10*3/uL (ref 0.0–0.7)
Eosinophils Relative: 3.9 % (ref 0.0–5.0)
HCT: 45.2 % (ref 39.0–52.0)
HEMOGLOBIN: 15.1 g/dL (ref 13.0–17.0)
Lymphocytes Relative: 27.4 % (ref 12.0–46.0)
Lymphs Abs: 1.6 10*3/uL (ref 0.7–4.0)
MCHC: 33.5 g/dL (ref 30.0–36.0)
MCV: 94.5 fl (ref 78.0–100.0)
MONO ABS: 0.4 10*3/uL (ref 0.1–1.0)
Monocytes Relative: 7.3 % (ref 3.0–12.0)
Neutro Abs: 3.6 10*3/uL (ref 1.4–7.7)
Neutrophils Relative %: 60.5 % (ref 43.0–77.0)
PLATELETS: 230 10*3/uL (ref 150.0–400.0)
RBC: 4.78 Mil/uL (ref 4.22–5.81)
RDW: 14.2 % (ref 11.5–15.5)
WBC: 6 10*3/uL (ref 4.0–10.5)

## 2016-10-27 MED ORDER — METRONIDAZOLE 500 MG PO TABS: 500.0000 mg | ORAL_TABLET | Freq: Three times a day (TID) | ORAL | 0 refills | Status: DC

## 2016-10-27 MED ORDER — CIPROFLOXACIN HCL 500 MG PO TABS: 500.0000 mg | ORAL_TABLET | Freq: Two times a day (BID) | ORAL | 0 refills | Status: DC

## 2016-10-27 NOTE — Patient Instructions (Addendum)
For your brief transient abdomen pain last night, loose stools and mild bloating, we will get a CBC today to assess your infection fighting cells.  You presently have no pain on exam but you do have a history of diverticulitis with portion of your bowel resected. So I do want to proceed with caution. If you do have recurrent pain even at low level then I want you to start both Cipro and Flagyl. These are printed prescriptions made available for recurrent symptoms. If you do start these please my chart me that you have done so and give me a report on your level of pain.  In the event that you do start the antibiotics I want to know that you were progressively improving. If not would recommend the imaging.  Also, it appears that you might be overdue for colonoscopy. So I will send referral to your GI and see if that is the case.  Follow up in 10 days or as needed
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
## 2016-11-10 ENCOUNTER — Encounter: Payer: Self-pay | Admitting: Internal Medicine

## 2016-11-11 ENCOUNTER — Encounter: Payer: Self-pay | Admitting: Family Medicine

## 2016-11-11 ENCOUNTER — Ambulatory Visit (INDEPENDENT_AMBULATORY_CARE_PROVIDER_SITE_OTHER): Payer: Medicare Other | Admitting: Family Medicine

## 2016-11-11 VITALS — BP 140/88 | HR 60 | Temp 98.3°F | Ht 73.0 in | Wt 261.2 lb

## 2016-11-11 DIAGNOSIS — J029 Acute pharyngitis, unspecified: Secondary | ICD-10-CM | POA: Diagnosis not present

## 2016-11-11 LAB — POCT RAPID STREP A (OFFICE): Rapid Strep A Screen: NEGATIVE

## 2016-11-11 NOTE — Patient Instructions (Signed)
I should have the results of the culture before the weekend. I check once on Saturday and will know for sure by then unless someone unexpected with the lab happens.  OK to take Tylenol 1000 mg (2 extra strength tabs) or 975 mg (3 regular strength tabs) every 6 hours as needed.  Salt water gargles, throat lozenges, air humidifier may be helpful.   Continue to push fluids, practice good hand hygiene.  If you start having fevers, shaking or shortness of breath, seek immediate care.

## 2016-11-11 NOTE — Progress Notes (Signed)
Pre visit review using our clinic review tool, if applicable. No additional management support is needed unless otherwise documented below in the visit note. 

## 2016-11-11 NOTE — Progress Notes (Signed)
Chief Complaint  Patient presents with  . Sore Throat    David [name] here for URI complaints.  Duration: 2 days  Associated symptoms: subjective fever, sinus congestion, ear pain and sore throat Denies: sinus pain, ear drainage, wheezing, shortness of breath and myalgia Sick contacts: Yes- he was exposed to strep thru daughter in law and grandchildren  ROS:  Const: + subjective fevers HEENT: As noted in HPI Lungs: No SOB  Past Medical History:  Diagnosis Date  . Abnormal liver function 03/18/2013  . Anxiety   . Arthritis of both knees 03/26/2016  . Atrial fibrillation ([place])   . Back pain   . BCC (basal cell carcinoma of skin)    under right eye and right ear  . Cataract 09/29/2016  . Cataracts, bilateral 03/26/2016  . Diverticulitis   . Diverticulosis   . GERD (gastroesophageal reflux disease)   . Gout   . Hearing loss of both ears 03/26/2016  . Hyperglycemia 03/29/2016  . Hyperlipidemia   . Hypertension   . IBS (irritable bowel syndrome) 03/18/2013  . Internal hemorrhoids   . LAFB (left anterior fascicular block)   . Low back pain 03/26/2016  . Medicare annual wellness visit, subsequent 03/12/2014  . Obesity 11/25/2007   Qualifier: Diagnosis of  By: Lenna Gilford MD, Deborra Medina   . Other malaise and fatigue 03/13/2013  . Premature ventricular complex   . Preventative health care 03/07/2015  . Rectal irritation 09/18/2012  . Tinea corporis 03/13/2013  . Unspecified constipation 09/25/2013   Family History  Problem Relation Age of Onset  . Heart disease Mother   . Cancer Mother        colon, breast  . Hypertension Mother   . Stroke Mother   . Heart disease Father        pacemaker  . Aortic aneurysm Father   . Hypertension Father   . Heart disease Sister   . Atrial fibrillation Sister   . Obesity Sister   . Sleep apnea Sister   . Heart attack Brother   . Other Brother        muscle disease  . Arthritis Brother   . Stroke Brother   . Cancer Maternal Grandmother        ?  Marland Kitchen  Heart attack Maternal Grandmother   . Diabetes Maternal Grandmother   . Cancer Maternal Grandfather        hodgin's lymphoma  . Heart attack Paternal Grandmother   . Anxiety disorder Paternal Grandmother   . Pneumonia Paternal Grandfather   . Heart attack Brother   . Atrial fibrillation Brother   . Heart attack Brother   . Hypertension Brother   . Hyperlipidemia Brother   . Heart attack Brother   . Other Brother        heart valve operation    BP 140/88 (BP Location: Left Arm, Patient Position: Sitting, Cuff Size: Large)   Pulse 60   Temp 98.3 F (36.8 C) (Oral)   Ht 6\' 1"  (1.854 m)   Wt 261 lb 4 oz (118.5 kg)   SpO2 97%   BMI 34.47 kg/m  General: Awake, alert, appears stated age HEENT: AT, [place], ears patent b/l and TM's neg, nares patent w L sided rhinorrhea, pharynx mildly erythematous without exudates, MMM Neck: No masses or asymmetry, no cerv adenopathy/tenderness Heart: RRR, no murmurs, no bruits Lungs: CTAB, no accessory muscle use Psych: Age appropriate judgment and insight, normal mood and affect  Sore throat  1/4 [place].  Rapid strep is neg. Culture sent per pt hx of positive cultures in setting of neg rapid and exposure. Continue to push fluids, practice good hand hygiene, cover mouth when coughing. F/u prn. If starting to experience fevers, shaking, or shortness of breath, seek immediate care. Pt voiced understanding and agreement to the plan.  [place], DO 11/11/16 8:12 AM

## 2016-11-11 NOTE — Addendum Note (Signed)
Addended by: Sharon Seller B on: 11/11/2016 08:19 AM   Modules accepted: Orders

## 2016-11-13 LAB — CULTURE, GROUP A STREP
MICRO NUMBER: 81221449
SPECIMEN QUALITY: ADEQUATE

## 2016-12-24 ENCOUNTER — Encounter: Payer: Self-pay | Admitting: Internal Medicine

## 2016-12-24 ENCOUNTER — Ambulatory Visit: Payer: Medicare Other | Admitting: Internal Medicine

## 2016-12-24 VITALS — BP 138/64 | HR 68 | Ht 72.0 in | Wt 264.2 lb

## 2016-12-24 DIAGNOSIS — R1032 Left lower quadrant pain: Secondary | ICD-10-CM | POA: Diagnosis not present

## 2016-12-24 NOTE — Patient Instructions (Signed)
Please follow up with Dr Carlean Purl as needed.  If you are age 71 or older, your body mass index should be between 23-30. Your Body mass index is 34.86 kg/m. If this is out of the aforementioned range listed, please consider follow up with your Primary Care Provider.  If you are age 68 or younger, your body mass index should be between 19-25. Your Body mass index is 34.86 kg/m. If this is out of the aformentioned range listed, please consider follow up with your Primary Care Provider.   Thank you for choosing me and [facility].  Gatha Mayer, MD, [facility]
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
## 2016-12-27 ENCOUNTER — Other Ambulatory Visit: Payer: Self-pay | Admitting: Family Medicine

## 2017-01-12 HISTORY — PX: EYE SURGERY: SHX253

## 2017-01-13 ENCOUNTER — Encounter: Payer: Self-pay | Admitting: Family Medicine

## 2017-01-13 ENCOUNTER — Ambulatory Visit: Payer: Medicare Other | Admitting: Family Medicine

## 2017-01-13 VITALS — BP 148/82 | HR 61 | Temp 98.6°F | Ht 72.0 in | Wt 264.0 lb

## 2017-01-13 DIAGNOSIS — R109 Unspecified abdominal pain: Secondary | ICD-10-CM

## 2017-01-13 NOTE — Progress Notes (Signed)
Pre visit review using our clinic review tool, if applicable. No additional management support is needed unless otherwise documented below in the visit note. 

## 2017-01-13 NOTE — Patient Instructions (Addendum)
Ibuprofen 400-600 mg (2-3 over the counter strength tabs) every 6 hours as needed for pain.  Heat (pad or rice pillow in microwave) over affected area, 10-15 minutes every 2-3 hours while awake.   Stretch the area as able.  If pain returns, OK to use Flexeril.   Let us know if you need anything.

## 2017-01-13 NOTE — Progress Notes (Signed)
Musculoskeletal Exam  Patient: David [name] DOB: 1945/07/04  DOS: 01/13/2017  SUBJECTIVE:  Chief Complaint:   Chief Complaint  Patient presents with  . Flank Pain    David [name] is a 72 y.o.  male for evaluation and treatment of R side pain.   Onset:  1 day ago. Lifting xmas decorations.  Location: R side over oblique Character:  aching  Progression of issue:  has significantly improved Associated symptoms: none Treatment: to date has been Aleve.   Neurovascular symptoms: no  ROS: Musculoskeletal/Extremities: +side pain  Past Medical History:  Diagnosis Date  . Abnormal liver function 03/18/2013  . Anxiety   . Arthritis of both knees 03/26/2016  . Atrial fibrillation ([place])   . Back pain   . BCC (basal cell carcinoma of skin)    under right eye and right ear  . Cataract 09/29/2016  . Cataracts, bilateral 03/26/2016  . Diverticulitis   . Diverticulosis   . GERD (gastroesophageal reflux disease)   . Gout   . Hearing loss of both ears 03/26/2016  . Hyperglycemia 03/29/2016  . Hyperlipidemia   . Hypertension   . IBS (irritable bowel syndrome) 03/18/2013  . Internal hemorrhoids   . LAFB (left anterior fascicular block)   . Low back pain 03/26/2016  . Medicare annual wellness visit, subsequent 03/12/2014  . Obesity 11/25/2007   Qualifier: Diagnosis of  By: Lenna Gilford MD, Deborra Medina   . Other malaise and fatigue 03/13/2013  . Premature ventricular complex   . Preventative health care 03/07/2015  . Rectal irritation 09/18/2012  . Tinea corporis 03/13/2013  . Unspecified constipation 09/25/2013    Objective: VITAL SIGNS: BP (!) 148/82 (BP Location: Left Arm, Patient Position: Sitting, Cuff Size: Large)   Pulse 61   Temp 98.6 F (37 C) (Oral)   Ht 6' (1.829 m)   Wt 264 lb (119.7 kg)   SpO2 96%   BMI 35.80 kg/m  Constitutional: Well formed, well developed. No acute distress. Thorax & Lungs: No accessory muscle use Musculoskeletal: R side.   No TTP over paraspinal msc, rib  cage or obliques.  Abd: BS+, soft, NT, ND, no masses or organomegaly Neurologic: Normal sensory function Psychiatric: Normal mood. Age appropriate judgment and insight.    Assessment:  Side pain  Plan: Heat, stretch, nsaids prn, OK to use msc relaxant if needed.  OK w bp given age. F/u prn. The patient voiced understanding and agreement to the plan.   [place], DO 01/13/17  10:07 AM

## 2017-01-18 DIAGNOSIS — H2510 Age-related nuclear cataract, unspecified eye: Secondary | ICD-10-CM | POA: Insufficient documentation

## 2017-01-21 ENCOUNTER — Encounter: Payer: Self-pay | Admitting: Family Medicine

## 2017-01-28 ENCOUNTER — Other Ambulatory Visit: Payer: Self-pay

## 2017-01-28 MED ORDER — LISINOPRIL 10 MG PO TABS: 10.0000 mg | ORAL_TABLET | Freq: Every day | ORAL | 3 refills | Status: DC

## 2017-01-29 ENCOUNTER — Other Ambulatory Visit: Payer: Self-pay

## 2017-01-29 MED ORDER — IRBESARTAN 150 MG PO TABS: 150.0000 mg | ORAL_TABLET | Freq: Every day | ORAL | 3 refills | Status: DC

## 2017-02-12 ENCOUNTER — Telehealth: Payer: Self-pay | Admitting: Internal Medicine

## 2017-02-12 ENCOUNTER — Other Ambulatory Visit: Payer: Self-pay

## 2017-02-12 ENCOUNTER — Ambulatory Visit: Payer: Medicare Other | Admitting: Internal Medicine

## 2017-02-12 ENCOUNTER — Encounter: Payer: Self-pay | Admitting: Internal Medicine

## 2017-02-12 VITALS — BP 126/74 | HR 74 | Temp 98.4°F | Resp 14 | Ht 72.0 in | Wt 253.0 lb

## 2017-02-12 DIAGNOSIS — L299 Pruritus, unspecified: Secondary | ICD-10-CM

## 2017-02-12 MED ORDER — TRIAMCINOLONE ACETONIDE 0.1 % EX LOTN: 1.0000 "application " | TOPICAL_LOTION | Freq: Three times a day (TID) | CUTANEOUS | 0 refills | Status: DC

## 2017-02-12 NOTE — Telephone Encounter (Signed)
Copied from [place] [id number]. Topic: Quick Communication - Rx Refill/Question >> Feb 12, 2017  2:59 PM Patrice Paradise wrote: Medication: triamcinolone lotion (KENALOG) 0.1 %   (PATIENT WOULD LIKE THE RX SEND TO THE PHARMACY BELOW, BECAUSE [facility] - [place], [place] CALLED HIM AND SAID THEY WOULD NOT HAVE IT UNTIL Monday)  Has the patient contacted their pharmacy? Yes.     (Agent: If no, request that the patient contact the pharmacy for the refill.)   Preferred Pharmacy (with phone number or street name):  [facility] [place], [place] [address] [place] [address] Phone: [phone] Fax: [phone]     Agent: Please be advised that RX refills may take up to 3 business days. We ask that you follow-up with your pharmacy.

## 2017-02-12 NOTE — Progress Notes (Signed)
Pre visit review using our clinic review tool, if applicable. No additional management support is needed unless otherwise documented below in the visit note. 

## 2017-02-12 NOTE — Progress Notes (Signed)
Subjective:    Patient ID: David [name], male    DOB: 05-04-1945, 72 y.o.   MRN: [id number]  DOS:  02/12/2017 Type of visit - description : acute Interval history: "Rash" at the lower back for about 2 weeks, the area is itching.  Not painful. It is somewhat similar to his pretibial skin where he was told he has "winter skin" (in the wintertime gets dry, itchy).   Review of Systems  No fever chills No nausea or vomiting No new medications or foods. He does use icy hot patch on the lower back.  Past Medical History:  Diagnosis Date  . Abnormal liver function 03/18/2013  . Anxiety   . Arthritis of both knees 03/26/2016  . Atrial fibrillation ([place])   . Back pain   . BCC (basal cell carcinoma of skin)    under right eye and right ear  . Cataract 09/29/2016  . Cataracts, bilateral 03/26/2016  . Diverticulitis   . Diverticulosis   . GERD (gastroesophageal reflux disease)   . Gout   . Hearing loss of both ears 03/26/2016  . Hyperglycemia 03/29/2016  . Hyperlipidemia   . Hypertension   . IBS (irritable bowel syndrome) 03/18/2013  . Internal hemorrhoids   . LAFB (left anterior fascicular block)   . Low back pain 03/26/2016  . Medicare annual wellness visit, subsequent 03/12/2014  . Obesity 11/25/2007   Qualifier: Diagnosis of  By: Lenna Gilford MD, Deborra Medina   . Other malaise and fatigue 03/13/2013  . Premature ventricular complex   . Preventative health care 03/07/2015  . Rectal irritation 09/18/2012  . Tinea corporis 03/13/2013  . Unspecified constipation 09/25/2013    Past Surgical History:  Procedure Laterality Date  . COLON SURGERY  2009   segmental sigmoid resection  . COLONOSCOPY    . NOSE SURGERY    . SKIN BIOPSY      Social History   Socioeconomic History  . Marital status: Married    Spouse name: Not on file  . Number of children: 3  . Years of education: Not on file  . Highest education level: Not on file  Social Needs  . Financial resource strain: Not on file  . Food  insecurity - worry: Not on file  . Food insecurity - inability: Not on file  . Transportation needs - medical: Not on file  . Transportation needs - non-medical: Not on file  Occupational History  . 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 Smoking status: Former Smoker    Packs/day: 1.00    Years: 6.00    Pack years: 6.00    Types: Cigarettes    Start date: 01/12/1969  . Smokeless tobacco: Never Used  Substance and Sexual Activity  . Alcohol use: Yes    Comment: 2 drinks every night  . Drug use: No  . Sexual activity: Yes  Other Topics Concern  . Not on file  Social History Narrative   The patient is married for the second time, he has 3 sons.   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   2 alcoholic beverages most days.   1 caffeinated beverage daily   No drug use no current tobacco use he is a prior smoker   12/24/2016      Allergies as of 02/12/2017      Reactions   Cefaclor Hives   Cephalosporins Hives   Penicillins Rash   Mild maculopapular rash  Medication List        Accurate as of 02/12/17 11:41 AM. Always use your most recent med list.          alum hydroxide-mag trisilicate 01-74 MG Chew chewable tablet Commonly known as:  GAVISCON Chew by mouth.   aspirin 81 MG tablet Take 81 mg by mouth daily as needed.   atenolol 25 MG tablet Commonly known as:  TENORMIN TAKE ONE TABLET BY MOUTH TWICE A DAY   atorvastatin 40 MG tablet Commonly known as:  LIPITOR Take 1 tablet (40 mg total) by mouth at bedtime.   cyclobenzaprine 5 MG tablet Commonly known as:  FLEXERIL Take 1 tablet (5 mg total) by mouth 3 (three) times daily as needed for muscle spasms.   fluticasone 50 MCG/ACT nasal spray Commonly known as:  FLONASE Place 2 sprays into the nose daily as needed. For congestion   hydrocortisone 2.5 % rectal cream Commonly known as:  PROCTOZONE-HC Place 1 application rectally as needed.   irbesartan 150 MG tablet Commonly known as:   AVAPRO Take 1 tablet (150 mg total) by mouth daily.   LORazepam 1 MG tablet Commonly known as:  ATIVAN TAKE ONE TABLET BY MOUTH EVERY 8 HOURS AS NEEDED FOR ANXIETY OR SLEEP   meloxicam 15 MG tablet Commonly known as:  MOBIC PRN As directed   omeprazole 20 MG capsule Commonly known as:  PRILOSEC TAKE ONE CAPSULE BY MOUTH DAILY   sertraline 100 MG tablet Commonly known as:  ZOLOFT TAKE 1 TABLET BY MOUTH DAILY   vitamin C 1000 MG tablet Take 1,000 mg by mouth daily.   Vitamin D-3 1000 units Caps Take 1 capsule (1,000 Units total) by mouth daily.          Objective:   Physical Exam BP 126/74 (BP Location: Left Arm, Patient Position: Sitting, Cuff Size: Normal)   Pulse 74   Temp 98.4 F (36.9 C) (Oral)   Resp 14   Ht 6' (1.829 m)   Wt 253 lb (114.8 kg)   SpO2 96%   BMI 34.31 kg/m   General:   Well developed, well nourished . NAD.  HEENT:  Normocephalic . Face symmetric, atraumatic  Skin: His whole back has dry skin, I was not able to notice any particular rash;  the skin by the lower back bilaterally  seems slightly irritated and red but again no particular rash. Neurologic:  alert & oriented X3.  Speech normal, gait appropriate for age and unassisted Psych--  Cognition and judgment appear intact.  Cooperative with normal attention span and concentration.  Behavior appropriate. No anxious or depressed appearing.      Assessment & Plan:    72 year old male with several medical problems including high cholesterol, HTN, atrial fibrillation on aspirin, GERD, IBS, BCC, renal insufficiency, presents  with  Itching skin, back: I do not see particular rash, related to dry skin?, recd to avoid very hot showers, use Kenalog and Aveeno.  Call if not better.

## 2017-02-12 NOTE — Patient Instructions (Signed)
Apply Kenalog lotion 3 times a day  In between applications, use Aveeno lotion as a moisturizing  Avoid icy-hot patches  Avoid very hot showers  Call if not improving

## 2017-03-10 ENCOUNTER — Other Ambulatory Visit: Payer: Self-pay | Admitting: Family Medicine

## 2017-03-10 DIAGNOSIS — E559 Vitamin D deficiency, unspecified: Secondary | ICD-10-CM

## 2017-03-10 DIAGNOSIS — I4821 Permanent atrial fibrillation: Secondary | ICD-10-CM

## 2017-03-10 DIAGNOSIS — E782 Mixed hyperlipidemia: Secondary | ICD-10-CM

## 2017-03-10 DIAGNOSIS — K760 Fatty (change of) liver, not elsewhere classified: Secondary | ICD-10-CM

## 2017-03-10 DIAGNOSIS — N289 Disorder of kidney and ureter, unspecified: Secondary | ICD-10-CM

## 2017-03-11 NOTE — Telephone Encounter (Signed)
[facility]  Requesting: lorazepam Contract: will get at 03/30/17 visit UDS: due (can get at 03/30/17 visit) Last OV: 08/13/16 Next OV: 03/30/17 Last Refill: 01/10/17  Last written 08/13/16 Database: no concerns   Please advise

## 2017-03-30 ENCOUNTER — Encounter: Payer: Self-pay | Admitting: Family Medicine

## 2017-03-30 ENCOUNTER — Ambulatory Visit (INDEPENDENT_AMBULATORY_CARE_PROVIDER_SITE_OTHER): Payer: Medicare Other | Admitting: Family Medicine

## 2017-03-30 VITALS — BP 130/78 | HR 58 | Temp 98.0°F | Resp 18 | Ht 72.0 in | Wt 251.2 lb

## 2017-03-30 DIAGNOSIS — F418 Other specified anxiety disorders: Secondary | ICD-10-CM

## 2017-03-30 DIAGNOSIS — I10 Essential (primary) hypertension: Secondary | ICD-10-CM | POA: Diagnosis not present

## 2017-03-30 DIAGNOSIS — N289 Disorder of kidney and ureter, unspecified: Secondary | ICD-10-CM

## 2017-03-30 DIAGNOSIS — Z0000 Encounter for general adult medical examination without abnormal findings: Secondary | ICD-10-CM | POA: Diagnosis not present

## 2017-03-30 DIAGNOSIS — R351 Nocturia: Secondary | ICD-10-CM

## 2017-03-30 DIAGNOSIS — H9190 Unspecified hearing loss, unspecified ear: Secondary | ICD-10-CM | POA: Diagnosis not present

## 2017-03-30 DIAGNOSIS — R252 Cramp and spasm: Secondary | ICD-10-CM | POA: Diagnosis not present

## 2017-03-30 DIAGNOSIS — M1A9XX0 Chronic gout, unspecified, without tophus (tophi): Secondary | ICD-10-CM

## 2017-03-30 DIAGNOSIS — Z1211 Encounter for screening for malignant neoplasm of colon: Secondary | ICD-10-CM | POA: Diagnosis not present

## 2017-03-30 DIAGNOSIS — R739 Hyperglycemia, unspecified: Secondary | ICD-10-CM | POA: Diagnosis not present

## 2017-03-30 DIAGNOSIS — E559 Vitamin D deficiency, unspecified: Secondary | ICD-10-CM | POA: Diagnosis not present

## 2017-03-30 DIAGNOSIS — Z79899 Other long term (current) drug therapy: Secondary | ICD-10-CM

## 2017-03-30 DIAGNOSIS — E782 Mixed hyperlipidemia: Secondary | ICD-10-CM | POA: Diagnosis not present

## 2017-03-30 LAB — LIPID PANEL
CHOL/HDL RATIO: 6
Cholesterol: 202 mg/dL — ABNORMAL HIGH (ref 0–200)
HDL: 35 mg/dL — ABNORMAL LOW (ref >39.00)
LDL Cholesterol: 146 mg/dL — ABNORMAL HIGH (ref 0–99)
NonHDL: 167.03
TRIGLYCERIDES: 105 mg/dL (ref 0.0–149.0)
VLDL: 21 mg/dL (ref 0.0–40.0)

## 2017-03-30 LAB — URIC ACID: URIC ACID, SERUM: 8.5 mg/dL — AB (ref 4.0–7.8)

## 2017-03-30 LAB — CBC
HCT: 43.4 % (ref 39.0–52.0)
Hemoglobin: 14.8 g/dL (ref 13.0–17.0)
MCHC: 34 g/dL (ref 30.0–36.0)
MCV: 92 fl (ref 78.0–100.0)
PLATELETS: 239 10*3/uL (ref 150.0–400.0)
RBC: 4.72 Mil/uL (ref 4.22–5.81)
RDW: 14.3 % (ref 11.5–15.5)
WBC: 5.7 10*3/uL (ref 4.0–10.5)

## 2017-03-30 LAB — VITAMIN D 25 HYDROXY (VIT D DEFICIENCY, FRACTURES): VITD: 39.67 ng/mL (ref 30.00–100.00)

## 2017-03-30 LAB — COMPREHENSIVE METABOLIC PANEL
ALBUMIN: 4.1 g/dL (ref 3.5–5.2)
ALT: 24 U/L (ref 0–53)
AST: 23 U/L (ref 0–37)
Alkaline Phosphatase: 67 U/L (ref 39–117)
BILIRUBIN TOTAL: 0.7 mg/dL (ref 0.2–1.2)
BUN: 21 mg/dL (ref 6–23)
CALCIUM: 10.4 mg/dL (ref 8.4–10.5)
CO2: 26 meq/L (ref 19–32)
Chloride: 105 mEq/L (ref 96–112)
Creatinine, Ser: 1.75 mg/dL — ABNORMAL HIGH (ref 0.40–1.50)
GFR: 40.94 mL/min — ABNORMAL LOW (ref >60.00)
Glucose, Bld: 102 mg/dL — ABNORMAL HIGH (ref 70–99)
Potassium: 4.2 mEq/L (ref 3.5–5.1)
SODIUM: 138 meq/L (ref 135–145)
Total Protein: 6.8 g/dL (ref 6.0–8.3)

## 2017-03-30 LAB — PSA: PSA: 3.16 ng/mL (ref 0.10–4.00)

## 2017-03-30 LAB — MAGNESIUM: Magnesium: 1.9 mg/dL (ref 1.5–2.5)

## 2017-03-30 LAB — HEMOGLOBIN A1C: Hgb A1c MFr Bld: 5.6 % (ref 4.6–6.5)

## 2017-03-30 LAB — TSH: TSH: 5.11 u[IU]/mL — AB (ref 0.35–4.50)

## 2017-03-30 NOTE — Assessment & Plan Note (Signed)
Check magnesium, hydrate well

## 2017-03-30 NOTE — Assessment & Plan Note (Signed)
Check cmp, mainttain hydration

## 2017-03-30 NOTE — Assessment & Plan Note (Addendum)
Check uric acid level. No flares in 4-5 years since cutting back on red meat and quitting beer

## 2017-03-30 NOTE — Assessment & Plan Note (Signed)
hgba1c acceptable, minimize simple carbs. Increase exercise as tolerated.  

## 2017-03-30 NOTE — Assessment & Plan Note (Signed)
Stopped Sertraline due to ED and he reports he feels well. He does not feel he needs a new med. He uses Lorazepam prn with good results. UDS, contract

## 2017-03-30 NOTE — Assessment & Plan Note (Signed)
L>R encouraged to try a hearing evaluation at [facility]

## 2017-03-30 NOTE — Assessment & Plan Note (Signed)
Patient encouraged to maintain heart healthy diet, regular exercise, adequate sleep. Consider daily probiotics. Take medications as prescribed. Labs ordered today

## 2017-03-30 NOTE — Assessment & Plan Note (Signed)
Up 2 times a night most night and sometimes up to 3 times a night.

## 2017-03-30 NOTE — Assessment & Plan Note (Signed)
Check level today 

## 2017-03-30 NOTE — Progress Notes (Signed)
Subjective:    Patient ID: David [name], male    DOB: 02/03/1945, 72 y.o.   MRN: [id number]  Chief Complaint  Patient presents with  . Annual Exam    Pt here for fasting physical.  Last tdap 09/05/12, prevnar 09/05/12, pneumococcal 23 08/30/14, colonoscopy 09/09/07, EKG 09/28/14.    HPI Patient is in today for annual exam and follow up on chronic medical concerns including hyperglycemmia, hyperlipidemia, and Hypertension. He feels well today. No recent febrile illness or hospitalizations. Notes some mild decrease hearing lately. Is considering starting Previgen. Notes some confusion with excessive stress at times. Notes some nocturia up two to three times a night. Denies CP/palp/SOB/HA/congestion/fevers/GI or GU c/o. Taking meds as prescribed. Is going to switch from the [place] Irbesartan.   Past Medical History:  Diagnosis Date  . Abnormal liver function 03/18/2013  . Anxiety   . Arthritis of both knees 03/26/2016  . Atrial fibrillation ([place])   . Back pain   . BCC (basal cell carcinoma of skin)    under right eye and right ear  . Cataract 09/29/2016  . Cataracts, bilateral 03/26/2016  . Diverticulitis   . Diverticulosis   . GERD (gastroesophageal reflux disease)   . Gout   . Hearing loss of both ears 03/26/2016  . Hyperglycemia 03/29/2016  . Hyperlipidemia   . Hypertension   . IBS (irritable bowel syndrome) 03/18/2013  . Internal hemorrhoids   . LAFB (left anterior fascicular block)   . Low back pain 03/26/2016  . Medicare annual wellness visit, subsequent 03/12/2014  . Obesity 11/25/2007   Qualifier: Diagnosis of  By: Lenna Gilford MD, Deborra Medina   . Other malaise and fatigue 03/13/2013  . Premature ventricular complex   . Preventative health care 03/07/2015  . Rectal irritation 09/18/2012  . Tinea corporis 03/13/2013  . Unspecified constipation 09/25/2013    Past Surgical History:  Procedure Laterality Date  . COLON SURGERY  2009   segmental sigmoid resection  . COLONOSCOPY    . NOSE  SURGERY    . SKIN BIOPSY      Family History  Problem Relation Age of Onset  . Heart disease Mother   . Hypertension Mother   . Stroke Mother   . Colon cancer Mother   . Breast cancer Mother   . Heart disease Father        pacemaker  . Aortic aneurysm Father   . Hypertension Father   . Heart disease Sister   . Atrial fibrillation Sister   . Obesity Sister   . Sleep apnea Sister   . Heart attack Brother   . Other Brother        muscle disease  . Arthritis Brother   . Stroke Brother   . Atrial fibrillation Brother   . Cancer Maternal Grandmother        ?  Marland Kitchen Heart attack Maternal Grandmother   . Diabetes Maternal Grandmother   . Cancer Maternal Grandfather        hodgin's lymphoma  . Heart attack Paternal Grandmother   . Anxiety disorder Paternal Grandmother   . Pneumonia Paternal Grandfather   . Heart attack Brother   . Atrial fibrillation Brother   . Atrial fibrillation Brother   . Heart attack Brother   . Hypertension Brother   . Hyperlipidemia Brother   . Heart attack Brother   . Other Brother        heart valve operation  . Atrial fibrillation Brother  Social History   Socioeconomic History  . Marital status: Married    Spouse name: Not on file  . Number of children: 3  . Years of education: Not on file  . Highest education level: Not on file  Social Needs  . Financial resource strain: Not on file  . Food insecurity - worry: Not on file  . Food insecurity - inability: Not on file  . Transportation needs - medical: Not on file  . Transportation needs - non-medical: Not on file  Occupational History  . 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 Smoking status: Former Smoker    Packs/day: 1.00    Years: 6.00    Pack years: 6.00    Types: Cigarettes    Start date: 01/12/1969  . Smokeless tobacco: Never Used  Substance and Sexual Activity  . Alcohol use: Yes    Comment: 2 drinks 5 nights a week  . Drug use: No  . Sexual activity: Yes    Other Topics Concern  . Not on file  Social History Narrative   The patient is married for the second time, he has 3 sons.   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   2 alcoholic beverages most days.   1 caffeinated beverage daily   No drug use no current tobacco use he is a prior smoker   12/24/2016    Outpatient Medications Prior to Visit  Medication Sig Dispense Refill  . alum hydroxide-mag trisilicate (GAVISCON) 10-27 MG CHEW Chew by mouth.    . Ascorbic Acid (VITAMIN C) 1000 MG tablet Take 1,000 mg by mouth daily.    Marland Kitchen aspirin 81 MG tablet Take 81 mg by mouth daily as needed.     Marland Kitchen atenolol (TENORMIN) 25 MG tablet TAKE ONE TABLET BY MOUTH TWICE A DAY 60 tablet 4  . atorvastatin (LIPITOR) 40 MG tablet Take 1 tablet (40 mg total) by mouth at bedtime. 30 tablet 5  . Cholecalciferol (VITAMIN D-3) 1000 units CAPS Take 1 capsule (1,000 Units total) by mouth daily. 60 capsule   . cyclobenzaprine (FLEXERIL) 5 MG tablet Take 1 tablet (5 mg total) by mouth 3 (three) times daily as needed for muscle spasms. 30 tablet 1  . fluticasone (FLONASE) 50 MCG/ACT nasal spray Place 2 sprays into the nose daily as needed. For congestion    . hydrocortisone (PROCTOZONE-HC) 2.5 % rectal cream Place 1 application rectally as needed. 30 g 2  . irbesartan (AVAPRO) 150 MG tablet Take 1 tablet (150 mg total) by mouth daily. 30 tablet 3  . LORazepam (ATIVAN) 1 MG tablet TAKE ONE TABLET BY MOUTH EVERY 8 HOURS AS NEEDED FOR ANXIETY OR SLEEP 90 tablet 0  . meloxicam (MOBIC) 15 MG tablet PRN As directed    . omeprazole (PRILOSEC) 20 MG capsule TAKE ONE CAPSULE BY MOUTH DAILY 30 capsule 5  . sertraline (ZOLOFT) 100 MG tablet TAKE 1 TABLET BY MOUTH DAILY 30 tablet 1  . triamcinolone lotion (KENALOG) 0.1 % Apply 1 application topically 3 (three) times daily. 120 mL 0   No facility-administered medications prior to visit.     Allergies  Allergen Reactions  . Cefaclor Hives  . Cephalosporins Hives  .  Penicillins Rash    Mild maculopapular rash    Review of Systems  Constitutional: Negative for fever and malaise/fatigue.  HENT: Negative for congestion.   Eyes: Negative for blurred vision.  Respiratory: Negative for shortness of breath.   Cardiovascular: Negative  for chest pain, palpitations and leg swelling.  Gastrointestinal: Negative for abdominal pain, blood in stool and nausea.  Genitourinary: Positive for frequency. Negative for dysuria, hematuria and urgency.  Musculoskeletal: Positive for myalgias. Negative for falls.  Skin: Negative for rash.  Neurological: Negative for dizziness, loss of consciousness and headaches.  Endo/Heme/Allergies: Negative for environmental allergies.  Psychiatric/Behavioral: Positive for depression. The patient is not nervous/anxious.        Objective:    Physical Exam  Constitutional: He is oriented to person, place, and time. He appears well-developed and well-nourished. No distress.  HENT:  Head: Normocephalic and atraumatic.  Eyes: Conjunctivae are normal.  Neck: Neck supple. No thyromegaly present.  Cardiovascular: Normal rate, regular rhythm and normal heart sounds.  No murmur heard. Pulmonary/Chest: Effort normal and breath sounds normal. No respiratory distress. He has no wheezes.  Abdominal: Soft. Bowel sounds are normal. He exhibits no mass. There is no tenderness.  Musculoskeletal: He exhibits no edema.  Lymphadenopathy:    He has no cervical adenopathy.  Neurological: He is alert and oriented to person, place, and time.  Skin: Skin is warm and dry.  Psychiatric: He has a normal mood and affect. His behavior is normal.    BP 130/78 (BP Location: Right Arm, Cuff Size: Large)   Pulse (!) 58   Temp 98 F (36.7 C) (Oral)   Resp 18   Ht 6' (1.829 m)   Wt 251 lb 3.2 oz (113.9 kg)   SpO2 98%   BMI 34.07 kg/m  Wt Readings from Last 3 Encounters:  03/30/17 251 lb 3.2 oz (113.9 kg)  02/12/17 253 lb (114.8 kg)  01/13/17 264 lb  (119.7 kg)     Lab Results  Component Value Date   WBC 6.0 10/27/2016   HGB 15.1 10/27/2016   HCT 45.2 10/27/2016   PLT 230.0 10/27/2016   GLUCOSE 91 09/29/2016   CHOL 202 (H) 09/29/2016   TRIG 127.0 09/29/2016   HDL 42.20 09/29/2016   LDLDIRECT 168.5 03/29/2006   LDLCALC 135 (H) 09/29/2016   ALT 28 09/29/2016   AST 24 09/29/2016   NA 137 09/29/2016   K 4.1 09/29/2016   CL 102 09/29/2016   CREATININE 1.23 09/29/2016   BUN 15 09/29/2016   CO2 29 09/29/2016   TSH 4.36 09/29/2016   PSA 1.47 02/23/2014   INR 1.39 11/14/2010   HGBA1C 5.7 09/29/2016    Lab Results  Component Value Date   TSH 4.36 09/29/2016   Lab Results  Component Value Date   WBC 6.0 10/27/2016   HGB 15.1 10/27/2016   HCT 45.2 10/27/2016   MCV 94.5 10/27/2016   PLT 230.0 10/27/2016   Lab Results  Component Value Date   NA 137 09/29/2016   K 4.1 09/29/2016   CO2 29 09/29/2016   GLUCOSE 91 09/29/2016   BUN 15 09/29/2016   CREATININE 1.23 09/29/2016   BILITOT 0.5 09/29/2016   ALKPHOS 64 09/29/2016   AST 24 09/29/2016   ALT 28 09/29/2016   PROT 6.9 09/29/2016   ALBUMIN 3.9 09/29/2016   CALCIUM 10.0 09/29/2016   ANIONGAP 6 09/27/2014   GFR 61.59 09/29/2016   Lab Results  Component Value Date   CHOL 202 (H) 09/29/2016   Lab Results  Component Value Date   HDL 42.20 09/29/2016   Lab Results  Component Value Date   LDLCALC 135 (H) 09/29/2016   Lab Results  Component Value Date   TRIG 127.0 09/29/2016   Lab Results  Component Value Date   CHOLHDL 5 09/29/2016   Lab Results  Component Value Date   HGBA1C 5.7 09/29/2016       Assessment & Plan:   Problem List Items Addressed This Visit    Hyperlipidemia, mixed    Encouraged heart healthy diet, increase exercise, avoid trans fats, consider a krill oil cap daily      Relevant Orders   Lipid panel   Essential hypertension, benign    Well controlled, no changes to meds. Encouraged heart healthy diet such as the DASH diet  and exercise as tolerated.       Relevant Orders   CBC   Comprehensive metabolic panel   TSH   Gout    Check uric acid level. No flares in 4-5 years since cutting back on red meat and quitting beer      Relevant Orders   Uric acid   Renal insufficiency    Check cmp, mainttain hydration      Vitamin D deficiency    Check level today      Relevant Orders   VITAMIN D 25 Hydroxy (Vit-D Deficiency, Fractures)   Depression with anxiety    Stopped Sertraline due to ED and he reports he feels well. He does not feel he needs a new med. He uses Lorazepam prn with good results. UDS, contract       Preventative health care    Patient encouraged to maintain heart healthy diet, regular exercise, adequate sleep. Consider daily probiotics. Take medications as prescribed. Labs ordered today      Hearing loss    L>R encouraged to try a hearing evaluation at [facility]      Hyperglycemia    hgba1c acceptable, minimize simple carbs. Increase exercise as tolerated.       Relevant Orders   Hemoglobin A1c   Muscle cramp    Check magnesium, hydrate well      Relevant Orders   Magnesium   Nocturia - Primary    Up 2 times a night most night and sometimes up to 3 times a night.       Relevant Orders   PSA      I am having David [name] maintain his fluticasone, meloxicam, alum hydroxide-mag trisilicate, aspirin, cyclobenzaprine, hydrocortisone, vitamin C, atorvastatin, sertraline, Vitamin D-3, omeprazole, atenolol, irbesartan, triamcinolone lotion, and LORazepam.  No orders of the defined types were placed in this encounter.    Penni Homans, MD

## 2017-03-30 NOTE — Assessment & Plan Note (Signed)
Encouraged heart healthy diet, increase exercise, avoid trans fats, consider a krill oil cap daily 

## 2017-03-30 NOTE — Assessment & Plan Note (Signed)
Well controlled, no changes to meds. Encouraged heart healthy diet such as the DASH diet and exercise as tolerated.  °

## 2017-03-30 NOTE — Patient Instructions (Addendum)
Consider hearing evaluation at [place] is the new shingles shot, 2 shots over 2-6 months, cheapest at pharmacy.  Preventive Care 71 Years and Older, Male Preventive care refers to lifestyle choices and visits with your health care provider that can promote health and wellness. What does preventive care include?  A yearly physical exam. This is also called an annual well check.  Dental exams once or twice a year.  Routine eye exams. Ask your health care provider how often you should have your eyes checked.  Personal lifestyle choices, including: ? Daily care of your teeth and gums. ? Regular physical activity. ? Eating a healthy diet. ? Avoiding tobacco and drug use. ? Limiting alcohol use. ? Practicing safe sex. ? Taking low doses of aspirin every day. ? Taking vitamin and mineral supplements as recommended by your health care provider. What happens during an annual well check? The services and screenings done by your health care provider during your annual well check will depend on your age, overall health, lifestyle risk factors, and family history of disease. Counseling Your health care provider may ask you questions about your:  Alcohol use.  Tobacco use.  Drug use.  Emotional well-being.  Home and relationship well-being.  Sexual activity.  Eating habits.  History of falls.  Memory and ability to understand (cognition).  Work and work Statistician.  Screening You may have the following tests or measurements:  Height, weight, and BMI.  Blood pressure.  Lipid and cholesterol levels. These may be checked every 5 years, or more frequently if you are over 74 years old.  Skin check.  Lung cancer screening. You may have this screening every year starting at age 57 if you have a 30-pack-year history of smoking and currently smoke or have quit within the past 15 years.  Fecal occult blood test (FOBT) of the stool. You may have this test every year  starting at age 67.  Flexible sigmoidoscopy or colonoscopy. You may have a sigmoidoscopy every 5 years or a colonoscopy every 10 years starting at age 63.  Prostate cancer screening. Recommendations will vary depending on your family history and other risks.  Hepatitis C blood test.  Hepatitis B blood test.  Sexually transmitted disease (STD) testing.  Diabetes screening. This is done by checking your blood sugar (glucose) after you have not eaten for a while (fasting). You may have this done every 1-3 years.  Abdominal aortic aneurysm (AAA) screening. You may need this if you are a current or former smoker.  Osteoporosis. You may be screened starting at age 44 if you are at high risk.  Talk with your health care provider about your test results, treatment options, and if necessary, the need for more tests. Vaccines Your health care provider may recommend certain vaccines, such as:  Influenza vaccine. This is recommended every year.  Tetanus, diphtheria, and acellular pertussis (Tdap, Td) vaccine. You may need a Td booster every 10 years.  Varicella vaccine. You may need this if you have not been vaccinated.  Zoster vaccine. You may need this after age 45.  Measles, mumps, and rubella (MMR) vaccine. You may need at least one dose of MMR if you were born in 1957 or later. You may also need a second dose.  Pneumococcal 13-valent conjugate (PCV13) vaccine. One dose is recommended after age 76.  Pneumococcal polysaccharide (PPSV23) vaccine. One dose is recommended after age 89.  Meningococcal vaccine. You may need this if you have certain conditions.  Hepatitis  A vaccine. You may need this if you have certain conditions or if you travel or work in places where you may be exposed to hepatitis A.  Hepatitis B vaccine. You may need this if you have certain conditions or if you travel or work in places where you may be exposed to hepatitis B.  Haemophilus influenzae type b (Hib)  vaccine. You may need this if you have certain risk factors.  Talk to your health care provider about which screenings and vaccines you need and how often you need them. This information is not intended to replace advice given to you by your health care provider. Make sure you discuss any questions you have with your health care provider. Document Released: 01/25/2015 Document Revised: 09/18/2015 Document Reviewed: 10/30/2014 Elsevier Interactive Patient Education  [employer].

## 2017-03-31 ENCOUNTER — Encounter (HOSPITAL_BASED_OUTPATIENT_CLINIC_OR_DEPARTMENT_OTHER): Payer: Self-pay

## 2017-03-31 ENCOUNTER — Other Ambulatory Visit: Payer: Self-pay

## 2017-03-31 ENCOUNTER — Emergency Department (HOSPITAL_BASED_OUTPATIENT_CLINIC_OR_DEPARTMENT_OTHER)
Admission: EM | Admit: 2017-03-31 | Discharge: 2017-03-31 | Disposition: A | Discharge status: Home / Self Care | Payer: Medicare Other | Attending: Emergency Medicine | Admitting: Emergency Medicine

## 2017-03-31 DIAGNOSIS — M10072 Idiopathic gout, left ankle and foot: Secondary | ICD-10-CM | POA: Insufficient documentation

## 2017-03-31 DIAGNOSIS — I10 Essential (primary) hypertension: Secondary | ICD-10-CM | POA: Insufficient documentation

## 2017-03-31 DIAGNOSIS — M25572 Pain in left ankle and joints of left foot: Secondary | ICD-10-CM | POA: Diagnosis present

## 2017-03-31 DIAGNOSIS — Z79899 Other long term (current) drug therapy: Secondary | ICD-10-CM | POA: Insufficient documentation

## 2017-03-31 DIAGNOSIS — Z7982 Long term (current) use of aspirin: Secondary | ICD-10-CM | POA: Diagnosis not present

## 2017-03-31 DIAGNOSIS — Z85828 Personal history of other malignant neoplasm of skin: Secondary | ICD-10-CM | POA: Diagnosis not present

## 2017-03-31 MED ORDER — COLCHICINE 0.6 MG PO TABS: ORAL_TABLET | ORAL | 0 refills | Status: DC

## 2017-03-31 NOTE — ED Triage Notes (Signed)
Pt states he feels he has gout left lateral ankle x today-denies injury-walking with own cane-NAD

## 2017-03-31 NOTE — ED Notes (Signed)
Pt reports ambulating 11miles Monday and doing ankle exercises. Did not noticed injury but reports his left ankle has given him problems in the past.

## 2017-03-31 NOTE — ED Provider Notes (Signed)
[facility] [place] DE[facility] Provider Note: Georgena Spurling, MD, FACEP  CSN: [id number] MRN: [id number] ARRIVAL: 03/31/17 at 2134 ROOM: [place]  Gout   HISTORY OF PRESENT ILLNESS  03/31/17 11:06 PM David [name] is a 72 y.o. male with a history of gout.  He is here with pain and swelling over his left lateral malleolus.  This occurred gradually today beginning about 4 PM.  There is significant pain associated with this which she rates as a 6 out of 10.  Pain is worse with movement or palpation.  He characterizes the pain is like previous gout attacks.  He admits to recent dietary indiscretion.  He denies trauma to that ankle.  He is requesting a prescription for colchicine.   Past Medical History:  Diagnosis Date  . Abnormal liver function 03/18/2013  . Anxiety   . Arthritis of both knees 03/26/2016  . Atrial fibrillation ([place])   . Back pain   . BCC (basal cell carcinoma of skin)    under right eye and right ear  . Cataract 09/29/2016  . Cataracts, bilateral 03/26/2016  . Diverticulitis   . Diverticulosis   . GERD (gastroesophageal reflux disease)   . Gout   . Hearing loss of both ears 03/26/2016  . Hyperglycemia 03/29/2016  . Hyperlipidemia   . Hypertension   . IBS (irritable bowel syndrome) 03/18/2013  . Internal hemorrhoids   . LAFB (left anterior fascicular block)   . Low back pain 03/26/2016  . Medicare annual wellness visit, subsequent 03/12/2014  . Obesity 11/25/2007   Qualifier: Diagnosis of  By: Lenna Gilford MD, Deborra Medina   . Other malaise and fatigue 03/13/2013  . Premature ventricular complex   . Preventative health care 03/07/2015  . Rectal irritation 09/18/2012  . Tinea corporis 03/13/2013  . Unspecified constipation 09/25/2013    Past Surgical History:  Procedure Laterality Date  . COLON SURGERY  2009   segmental sigmoid resection  . COLONOSCOPY    . NOSE SURGERY    . SKIN BIOPSY      Family History  Problem Relation Age of Onset  . Heart disease  Mother   . Hypertension Mother   . Stroke Mother   . Colon cancer Mother   . Breast cancer Mother   . Heart disease Father        pacemaker  . Aortic aneurysm Father   . Hypertension Father   . Heart disease Sister   . Atrial fibrillation Sister   . Obesity Sister   . Sleep apnea Sister   . Heart attack Brother   . Other Brother        muscle disease  . Arthritis Brother   . Stroke Brother   . Atrial fibrillation Brother   . Cancer Maternal Grandmother        ?  Marland Kitchen Heart attack Maternal Grandmother   . Diabetes Maternal Grandmother   . Cancer Maternal Grandfather        hodgin's lymphoma  . Heart attack Paternal Grandmother   . Anxiety disorder Paternal Grandmother   . Pneumonia Paternal Grandfather   . Heart attack Brother   . Atrial fibrillation Brother   . Atrial fibrillation Brother   . Heart attack Brother   . Hypertension Brother   . Hyperlipidemia Brother   . Heart attack Brother   . Other Brother        heart valve operation  . Atrial fibrillation Brother     Social History  Tobacco Use  . Smoking status: Former Smoker    Packs/day: 1.00    Years: 6.00    Pack years: 6.00    Types: Cigarettes    Start date: 01/12/1969  . Smokeless tobacco: Never Used  Substance Use Topics  . Alcohol use: Yes    Comment: 2 drinks 5 nights a week  . Drug use: No    Prior to Admission medications   Medication Sig Start Date End Date Taking? Authorizing Provider  alum hydroxide-mag trisilicate (GAVISCON) 76-72 MG CHEW Chew by mouth.    [provider]  Ascorbic Acid (VITAMIN C) 1000 MG tablet Take 1,000 mg by mouth daily.    [provider]  aspirin 81 MG tablet Take 81 mg by mouth daily as needed.     [provider]  atenolol (TENORMIN) 25 MG tablet TAKE ONE TABLET BY MOUTH TWICE A DAY 12/28/16   Mosie Lukes, MD  atorvastatin (LIPITOR) 40 MG tablet Take 1 tablet (40 mg total) by mouth at bedtime. 04/01/16   Mosie Lukes, MD    Cholecalciferol (VITAMIN D-3) 1000 units CAPS Take 1 capsule (1,000 Units total) by mouth daily. 09/29/16   Mosie Lukes, MD  cyclobenzaprine (FLEXERIL) 5 MG tablet Take 1 tablet (5 mg total) by mouth 3 (three) times daily as needed for muscle spasms. 06/07/15   Mosie Lukes, MD  fluticasone (FLONASE) 50 MCG/ACT nasal spray Place 2 sprays into the nose daily as needed. For congestion    [provider]  hydrocortisone (PROCTOZONE-HC) 2.5 % rectal cream Place 1 application rectally as needed. 06/07/15   Mosie Lukes, MD  irbesartan (AVAPRO) 150 MG tablet Take 1 tablet (150 mg total) by mouth daily. 01/29/17   Mosie Lukes, MD  LORazepam (ATIVAN) 1 MG tablet TAKE ONE TABLET BY MOUTH EVERY 8 HOURS AS NEEDED FOR ANXIETY OR SLEEP 03/11/17   Mosie Lukes, MD  meloxicam (MOBIC) 15 MG tablet PRN As directed 02/11/12   [provider]  omeprazole (PRILOSEC) 20 MG capsule TAKE ONE CAPSULE BY MOUTH DAILY 10/13/16   Mosie Lukes, MD  triamcinolone lotion (KENALOG) 0.1 % Apply 1 application topically 3 (three) times daily. 02/12/17   Mosie Lukes, MD    Allergies Cefaclor; Cephalosporins; and Penicillins   REVIEW OF SYSTEMS  Negative except as noted here or in the History of Present Illness.   PHYSICAL EXAMINATION  Initial Vital Signs Blood pressure (!) 154/88, pulse 65, temperature 98.2 F (36.8 C), temperature source Oral, resp. rate 18, height 6\' 1"  (1.854 m), weight 113.9 kg (251 lb), SpO2 99 %.  Examination General: Well-developed, well-nourished male in no acute distress; appearance consistent with age of record HENT: normocephalic; atraumatic Eyes: pupils equal, round and reactive to light; extraocular muscles intact; bilateral pseudophakia Neck: supple Heart: regular rate and rhythm Lungs: clear to auscultation bilaterally Abdomen: soft; nondistended; nontender; bowel sounds present Extremities: No deformity; pulses normal; tenderness, swelling, mild  erythema and mild warmth of the left lateral malleolus Neurologic: Awake, alert and oriented; motor function intact in all extremities and symmetric; no facial droop Skin: Warm and dry Psychiatric: Normal mood and affect   RESULTS  Summary of this visit's results, reviewed by myself:   EKG Interpretation  Date/Time:    Ventricular Rate:    PR Interval:    QRS Duration:   QT Interval:    QTC Calculation:   R Axis:     Text Interpretation:  Laboratory Studies: No results found for this or any previous visit (from the past 24 hour(s)). Imaging Studies: No results found.  ED COURSE  Nursing notes and initial vitals signs, including pulse oximetry, reviewed.  Vitals:   03/31/17 2147  BP: (!) 154/88  Pulse: 65  Resp: 18  Temp: 98.2 F (36.8 C)  TempSrc: Oral  SpO2: 99%  Weight: 113.9 kg (251 lb)  Height: 6\' 1"  (1.854 m)   History and examination consistent with gout.  He has had several episodes in the past which usually respond well to colchicine.  PROCEDURES    ED DIAGNOSES     ICD-10-CM   1. Acute idiopathic gout of left ankle M10.072        Shanon Rosser, MD 03/31/17 2324

## 2017-04-01 ENCOUNTER — Other Ambulatory Visit (INDEPENDENT_AMBULATORY_CARE_PROVIDER_SITE_OTHER): Payer: Medicare Other

## 2017-04-01 DIAGNOSIS — R7989 Other specified abnormal findings of blood chemistry: Secondary | ICD-10-CM

## 2017-04-01 LAB — T4, FREE: FREE T4: 0.89 ng/dL (ref 0.60–1.60)

## 2017-04-02 LAB — PAIN MGMT, PROFILE 8 W/CONF, U
6 Acetylmorphine: NEGATIVE ng/mL (ref <10)
ALCOHOL METABOLITES: NEGATIVE ng/mL (ref <500)
ALPHAHYDROXYALPRAZOLAM: NEGATIVE ng/mL (ref <25)
ALPHAHYDROXYMIDAZOLAM: NEGATIVE ng/mL (ref <50)
AMINOCLONAZEPAM: NEGATIVE ng/mL (ref <25)
AMPHETAMINES: NEGATIVE ng/mL (ref <500)
Alphahydroxytriazolam: NEGATIVE ng/mL (ref <50)
BUPRENORPHINE, URINE: NEGATIVE ng/mL (ref <5)
Benzodiazepines: POSITIVE ng/mL — AB (ref <100)
COCAINE METABOLITE: NEGATIVE ng/mL (ref <150)
CREATININE: 192.5 mg/dL
Hydroxyethylflurazepam: NEGATIVE ng/mL (ref <50)
LORAZEPAM: 854 ng/mL — AB (ref <50)
MDMA: NEGATIVE ng/mL (ref <500)
Marijuana Metabolite: NEGATIVE ng/mL (ref <20)
Nordiazepam: NEGATIVE ng/mL (ref <50)
OXIDANT: NEGATIVE ug/mL (ref <200)
Opiates: NEGATIVE ng/mL (ref <100)
Oxazepam: NEGATIVE ng/mL (ref <50)
Oxycodone: NEGATIVE ng/mL (ref <100)
PH: 5.95 (ref 4.5–9.0)
TEMAZEPAM: NEGATIVE ng/mL (ref <50)

## 2017-04-13 ENCOUNTER — Encounter: Payer: Self-pay | Admitting: Family Medicine

## 2017-04-13 ENCOUNTER — Ambulatory Visit: Payer: Medicare Other | Admitting: Family Medicine

## 2017-04-13 VITALS — BP 126/72 | HR 64 | Temp 97.9°F | Resp 16 | Ht 72.05 in | Wt 248.6 lb

## 2017-04-13 DIAGNOSIS — K5790 Diverticulosis of intestine, part unspecified, without perforation or abscess without bleeding: Secondary | ICD-10-CM | POA: Diagnosis not present

## 2017-04-13 DIAGNOSIS — R739 Hyperglycemia, unspecified: Secondary | ICD-10-CM

## 2017-04-13 DIAGNOSIS — R945 Abnormal results of liver function studies: Secondary | ICD-10-CM | POA: Diagnosis not present

## 2017-04-13 DIAGNOSIS — N289 Disorder of kidney and ureter, unspecified: Secondary | ICD-10-CM

## 2017-04-13 DIAGNOSIS — E559 Vitamin D deficiency, unspecified: Secondary | ICD-10-CM | POA: Diagnosis not present

## 2017-04-13 DIAGNOSIS — M109 Gout, unspecified: Secondary | ICD-10-CM | POA: Diagnosis not present

## 2017-04-13 DIAGNOSIS — E782 Mixed hyperlipidemia: Secondary | ICD-10-CM | POA: Diagnosis not present

## 2017-04-13 DIAGNOSIS — R7989 Other specified abnormal findings of blood chemistry: Secondary | ICD-10-CM | POA: Diagnosis not present

## 2017-04-13 DIAGNOSIS — I10 Essential (primary) hypertension: Secondary | ICD-10-CM

## 2017-04-13 NOTE — Progress Notes (Signed)
Subjective:  I acted as a Education administrator for David [name]. David [name], David [name]   Patient ID: David [name], male    DOB: [birth year], 72 y.o.   MRN: [id number]  Chief Complaint  Patient presents with  . Medication changes    HPI  Patient is in today for changes to medication.and to discuss his concerns regarding his chart he has reviewed online. He asks for the definitions of diagnoses including hyperlipidemia, hypertriglyceridemia and more. He has been trying to stay well hydrated and has only had one joint flare recently which responded to a small dose of Colchicine. No recent febrile illness or hospitalizations. Denies CP/palp/SOB/HA/congestion/fevers/GI or GU c/o. Taking meds as prescribed  Patient Care Team: David Lukes, MD as PCP - General (Family Medicine) David Natal, MD as Consulting Physician (Ophthalmology) David Sensing, MD as Consulting Physician (Dermatology)   Past Medical History:  Diagnosis Date  . Abnormal liver function 03/18/2013  . Anxiety   . Arthritis of both knees 03/26/2016  . Atrial fibrillation ([place])   . Back pain   . BCC (basal cell carcinoma of skin)    under right eye and right ear  . Cataract 09/29/2016  . Cataracts, bilateral 03/26/2016  . Diverticulitis   . Diverticulosis   . GERD (gastroesophageal reflux disease)   . Gout   . Hearing loss of both ears 03/26/2016  . Hyperglycemia 03/29/2016  . Hyperlipidemia   . Hypertension   . IBS (irritable bowel syndrome) 03/18/2013  . Internal hemorrhoids   . LAFB (left anterior fascicular block)   . Low back pain 03/26/2016  . Medicare annual wellness visit, subsequent 03/12/2014  . Obesity 11/25/2007   Qualifier: Diagnosis of  By: Lenna Gilford MD, Deborra Medina   . Other malaise and fatigue 03/13/2013  . Premature ventricular complex   . Preventative health care 03/07/2015  . Rectal irritation 09/18/2012  . Tinea corporis 03/13/2013  . Unspecified constipation 09/25/2013    Past Surgical History:  Procedure Laterality Date  .  COLON SURGERY  2009   segmental sigmoid resection  . COLONOSCOPY    . NOSE SURGERY    . SKIN BIOPSY      Family History  Problem Relation Age of Onset  . Heart disease Mother   . Hypertension Mother   . Stroke Mother   . Colon cancer Mother   . Breast cancer Mother   . Heart disease Father        pacemaker  . Aortic aneurysm Father   . Hypertension Father   . Heart disease Sister   . Atrial fibrillation Sister   . Obesity Sister   . Sleep apnea Sister   . Heart attack Brother   . Other Brother        muscle disease  . Arthritis Brother   . Stroke Brother   . Atrial fibrillation Brother   . Cancer Maternal Grandmother        ?  Marland Kitchen Heart attack Maternal Grandmother   . Diabetes Maternal Grandmother   . Cancer Maternal Grandfather        hodgin's lymphoma  . Heart attack Paternal Grandmother   . Anxiety disorder Paternal Grandmother   . Pneumonia Paternal Grandfather   . Heart attack Brother   . Atrial fibrillation Brother   . Atrial fibrillation Brother   . Heart attack Brother   . Hypertension Brother   . Hyperlipidemia Brother   . Heart attack Brother   . Other Brother  heart valve operation  . Atrial fibrillation Brother     Social History   Socioeconomic History  . Marital status: Married    Spouse name: Not on file  . Number of children: 3  . Years of education: Not on file  . Highest education level: Not on file  Occupational History  . Occupation: Nurse, adult: [employer]  . Financial resource strain: Not on file  . Food insecurity:    Worry: Not on file    Inability: Not on file  . Transportation needs:    Medical: Not on file    Non-medical: Not on file  Tobacco Use  . Smoking status: Former Smoker    Packs/day: 1.00    Years: 6.00    Pack years: 6.00    Types: Cigarettes    Start date: 01/12/1969  . Smokeless tobacco: Never Used  Substance and Sexual Activity  . Alcohol use: Yes    Comment: 2 drinks 5 nights a week    . Drug use: No  . Sexual activity: Yes  Lifestyle  . Physical activity:    Days per week: Not on file    Minutes per session: Not on file  . Stress: Not on file  Relationships  . Social connections:    Talks on phone: Not on file    Gets together: Not on file    Attends religious service: Not on file    Active member of club or organization: Not on file    Attends meetings of clubs or organizations: Not on file    Relationship status: Not on file  . Intimate partner violence:    Fear of current or ex partner: Not on file    Emotionally abused: Not on file    Physically abused: Not on file    Forced sexual activity: Not on file  Other Topics Concern  . Not on file  Social History Narrative   The patient is married for the second time, he has 3 sons.   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   2 alcoholic beverages most days.   1 caffeinated beverage daily   No drug use no current tobacco use he is a prior smoker   12/24/2016    Outpatient Medications Prior to Visit  Medication Sig Dispense Refill  . alum hydroxide-mag trisilicate (GAVISCON) 85-46 MG CHEW Chew by mouth.    . Ascorbic Acid (VITAMIN C) 1000 MG tablet Take 1,000 mg by mouth daily.    Marland Kitchen aspirin 81 MG tablet Take 81 mg by mouth daily as needed.     Marland Kitchen atenolol (TENORMIN) 25 MG tablet TAKE ONE TABLET BY MOUTH TWICE A DAY 60 tablet 4  . atorvastatin (LIPITOR) 40 MG tablet Take 1 tablet (40 mg total) by mouth at bedtime. 30 tablet 5  . Cholecalciferol (VITAMIN D-3) 1000 units CAPS Take 1 capsule (1,000 Units total) by mouth daily. 60 capsule   . cyclobenzaprine (FLEXERIL) 5 MG tablet Take 1 tablet (5 mg total) by mouth 3 (three) times daily as needed for muscle spasms. 30 tablet 1  . fluticasone (FLONASE) 50 MCG/ACT nasal spray Place 2 sprays into the nose daily as needed. For congestion    . hydrocortisone (PROCTOZONE-HC) 2.5 % rectal cream Place 1 application rectally as needed. 30 g 2  . irbesartan (AVAPRO) 150  MG tablet Take 1 tablet (150 mg total) by mouth daily. 30 tablet 3  . LORazepam (ATIVAN) 1 MG tablet TAKE  ONE TABLET BY MOUTH EVERY 8 HOURS AS NEEDED FOR ANXIETY OR SLEEP 90 tablet 0  . omeprazole (PRILOSEC) 20 MG capsule TAKE ONE CAPSULE BY MOUTH DAILY 30 capsule 5  . triamcinolone lotion (KENALOG) 0.1 % Apply 1 application topically 3 (three) times daily. 120 mL 0  . colchicine 0.6 MG tablet Take 2 tablets then 1 tablet 1 hour later as needed for gout.  Repeat daily until gout flare resolves. 30 tablet 0  . meloxicam (MOBIC) 15 MG tablet PRN As directed     No facility-administered medications prior to visit.     Allergies  Allergen Reactions  . Cefaclor Hives  . Cephalosporins Hives  . Penicillins Rash    Mild maculopapular rash    Review of Systems  Constitutional: Positive for malaise/fatigue. Negative for chills and fever.  HENT: Negative for congestion and hearing loss.   Eyes: Negative for discharge.  Respiratory: Negative for cough, sputum production and shortness of breath.   Cardiovascular: Negative for chest pain, palpitations and leg swelling.  Gastrointestinal: Negative for abdominal pain, blood in stool, constipation, diarrhea, heartburn, nausea and vomiting.  Genitourinary: Negative for dysuria, frequency, hematuria and urgency.  Musculoskeletal: Positive for joint pain. Negative for back pain, falls and myalgias.  Skin: Negative for rash.  Neurological: Negative for dizziness, sensory change, loss of consciousness, weakness and headaches.  Endo/Heme/Allergies: Negative for environmental allergies. Does not bruise/bleed easily.  Psychiatric/Behavioral: Negative for depression and suicidal ideas. The patient is not nervous/anxious and does not have insomnia.        Objective:    Physical Exam  Constitutional: He is oriented to person, place, and time. He appears well-developed and well-nourished. No distress.  HENT:  Head: Normocephalic and atraumatic.  Eyes:  Conjunctivae are normal.  Neck: Neck supple. No thyromegaly present.  Cardiovascular: Normal rate, regular rhythm and normal heart sounds.  No murmur heard. Pulmonary/Chest: Effort normal and breath sounds normal. No respiratory distress. He has no wheezes.  Abdominal: Soft. Bowel sounds are normal. He exhibits no mass. There is no tenderness.  Musculoskeletal: He exhibits no edema.  Lymphadenopathy:    He has no cervical adenopathy.  Neurological: He is alert and oriented to person, place, and time.  Skin: Skin is warm and dry.  Psychiatric: He has a normal mood and affect. His behavior is normal.    BP 126/72 (BP Location: Left Arm, Patient Position: Sitting, Cuff Size: Large)   Pulse 64   Temp 97.9 F (36.6 C) (Oral)   Resp 16   Ht 6' 0.05" (1.83 m)   Wt 248 lb 9.6 oz (112.8 kg)   SpO2 97%   BMI 33.67 kg/m  Wt Readings from Last 3 Encounters:  04/13/17 248 lb 9.6 oz (112.8 kg)  03/31/17 251 lb (113.9 kg)  03/30/17 251 lb 3.2 oz (113.9 kg)   BP Readings from Last 3 Encounters:  04/13/17 126/72  03/31/17 (!) 154/88  03/30/17 130/78     Immunization History  Administered Date(s) Administered  . Influenza Split 10/02/2015  . Influenza, High Dose Seasonal PF 09/29/2016  . Influenza,inj,Quad PF,6+ Mos 09/14/2012, 09/25/2013, 03/07/2015  . Pneumococcal Conjugate-13 09/05/2012  . Pneumococcal Polysaccharide-23 08/30/2014  . Tdap 09/05/2012    Health Maintenance  Topic Date Due  . INFLUENZA VACCINE  08/12/2017  . COLONOSCOPY  09/08/2017  . TETANUS/TDAP  09/06/2022  . Hepatitis C Screening  Completed  . PNA vac Low Risk Adult  Completed    Lab Results  Component Value Date  WBC 5.7 03/30/2017   HGB 14.8 03/30/2017   HCT 43.4 03/30/2017   PLT 239.0 03/30/2017   GLUCOSE 82 04/13/2017   CHOL 202 (H) 03/30/2017   TRIG 105.0 03/30/2017   HDL 35.00 (L) 03/30/2017   LDLDIRECT 168.5 03/29/2006   LDLCALC 146 (H) 03/30/2017   ALT 34 04/13/2017   AST 29 04/13/2017    NA 138 04/13/2017   K 4.4 04/13/2017   CL 103 04/13/2017   CREATININE 1.43 04/13/2017   BUN 15 04/13/2017   CO2 26 04/13/2017   TSH 2.98 04/13/2017   PSA 3.16 03/30/2017   INR 1.39 11/14/2010   HGBA1C 5.6 03/30/2017    Lab Results  Component Value Date   TSH 2.98 04/13/2017   Lab Results  Component Value Date   WBC 5.7 03/30/2017   HGB 14.8 03/30/2017   HCT 43.4 03/30/2017   MCV 92.0 03/30/2017   PLT 239.0 03/30/2017   Lab Results  Component Value Date   NA 138 04/13/2017   K 4.4 04/13/2017   CO2 26 04/13/2017   GLUCOSE 82 04/13/2017   BUN 15 04/13/2017   CREATININE 1.43 04/13/2017   BILITOT 0.6 04/13/2017   ALKPHOS 62 04/13/2017   AST 29 04/13/2017   ALT 34 04/13/2017   PROT 7.3 04/13/2017   ALBUMIN 4.1 04/13/2017   CALCIUM 10.3 04/13/2017   ANIONGAP 6 09/27/2014   GFR 51.68 (L) 04/13/2017   Lab Results  Component Value Date   CHOL 202 (H) 03/30/2017   Lab Results  Component Value Date   HDL 35.00 (L) 03/30/2017   Lab Results  Component Value Date   LDLCALC 146 (H) 03/30/2017   Lab Results  Component Value Date   TRIG 105.0 03/30/2017   Lab Results  Component Value Date   CHOLHDL 6 03/30/2017   Lab Results  Component Value Date   HGBA1C 5.6 03/30/2017         Assessment & Plan:   Problem List Items Addressed This Visit    Hyperlipidemia, mixed    After reviewing his chart he is in today with numerous questions regarding his labs and diagnosis. He asks what hyperlipidemia and hypertriglyceridemia mean and he is given the definition. Visit was 45 minutes      Essential hypertension, benign    Well controlled, no changes to meds. Encouraged heart healthy diet such as the DASH diet and exercise as tolerated.       Diverticulosis - Primary   Renal insufficiency    Discussed need for adequate hydration. Avoid OTC preparatins without asking [place] and continue to monitor      Relevant Orders   Comprehensive metabolic panel (Completed)    Abnormal liver function    Normalized. Minimize simple carbs      Vitamin D deficiency    Distant hsitorya nd last check it is wnl      Gouty arthritis of toe of left foot    Uric acid check today. Will consider stopping Colchicine and he is encouraged to stay well hydrated and report any flares. If he can stay off the medications it should help his renal functions.       Relevant Orders   Uric acid (Completed)   Hyperglycemia    hgba1c acceptable, minimize simple carbs. Increase exercise as tolerated.      Abnormal TSH    Normalized on recheck free T4 normal      Relevant Orders   TSH (Completed)   T4, free (Completed)  I have discontinued [place] [employer] meloxicam and colchicine. I am also having him maintain his fluticasone, alum hydroxide-mag trisilicate, aspirin, cyclobenzaprine, hydrocortisone, vitamin C, atorvastatin, Vitamin D-3, omeprazole, atenolol, irbesartan, triamcinolone lotion, and LORazepam.  No orders of the defined types were placed in this encounter.   CMA served as Education administrator during this visit. History, Physical and Plan performed by medical provider. Documentation and orders reviewed and attested to.  Penni Homans, MD

## 2017-04-13 NOTE — Patient Instructions (Addendum)
Chronic Kidney Disease, Adult Chronic kidney disease (CKD) occurs when the kidneys become damaged slowly over a long period of time. The kidneys are a pair of organs that do many important jobs in the body, including:  Removing waste and extra fluid from the blood to make urine.  Making hormones that maintain the amount of fluid in tissues and blood vessels.  Maintaining the right amount of fluids and chemicals in the body.  A small amount of kidney damage may not cause problems, but a large amount of damage may make it hard or impossible for the kidneys to work the way they should. If steps are not taken to slow down kidney damage or to stop it from getting worse, the kidneys may stop working permanently (end-stage renal disease or ESRD). Most of the time, CKD does not go away, but it can often be controlled. People who have CKD are usually able to live normal lives. What are the causes? The most common causes of this condition are diabetes and high blood pressure (hypertension). Other causes include:  Heart and blood vessel (cardiovascular) disease.  Kidney diseases, such as: ? Glomerulonephritis. ? Interstitial nephritis. ? Polycystic kidney disease. ? Renal vascular disease.  Diseases that affect the immune system.  Genetic diseases.  Medicines that damage the kidneys, such as anti-inflammatory medicines.  Being around or being in contact with poisonous (toxic) substances.  A kidney or urinary infection that occurs again and again (recurs).  Vasculitis. This is swelling or inflammation of the blood vessels.  A problem with urine flow that may be caused by: ? Cancer. ? Having kidney stones more than one time. ? An enlarged prostate, in males.  What increases the risk? You are more likely to develop this condition if you:  Are older than age 60.  Are male.  Are African-American, Hispanic, Asian, Pacific Islander, or American Indian.  Are a current or former  smoker.  Are obese.  Have a family history of kidney disease or failure.  Often take medicines that are damaging to the kidneys.  What are the signs or symptoms? Symptoms of this condition include:  Swelling (edema) of the face, legs, ankles, or feet.  Tiredness (lethargy) and having less energy.  Nausea or vomiting.  Confusion or trouble concentrating.  Problems with urination, such as: ? Painful or burning feeling during urination. ? Decreased urine production. ? Frequent urination, especially at night. ? Bloody urine.  Muscle twitches and cramps, especially in the legs.  Shortness of breath.  Weakness.  Loss of appetite.  Metallic taste in the mouth.  Trouble sleeping.  Dry, itchy skin.  A low blood count (anemia).  Pale lining of the eyelids and surface of the eye (conjunctiva).  Symptoms develop slowly and may not be obvious until the kidney damage becomes severe. It is possible to have kidney disease for years without having any symptoms. How is this diagnosed? This condition may be diagnosed based on:  Blood tests.  Urine tests.  Imaging tests, such as an ultrasound or CT scan.  A test in which a sample of tissue is removed from the kidneys to be examined under a microscope (kidney biopsy).  These test results will help your health care provider determine how serious the CKD is. How is this treated? There is no cure for most cases of this condition, but treatment usually relieves symptoms and prevents or slows the progression of the disease. Treatment may include:  Making diet changes, which may require you to   avoid alcohol, salty foods (sodium), and foods that are high in potassium, calcium, and protein.  Medicines: ? To lower blood pressure. ? To control blood glucose. ? To relieve anemia. ? To relieve swelling. ? To protect your bones. ? To improve the balance of electrolytes in your blood.  Removing toxic waste from the body through  types of dialysis, if the kidneys can no longer do their job (kidney failure).  Managing any other conditions that are causing your CKD or making it worse.  Follow these instructions at home: Medicines  Take over-the-counter and prescription medicines only as told by your health care provider. The dose of some medicines that you take may need to be adjusted.  Do not take any new medicines unless approved by your health care provider. Many medicines can worsen your kidney damage.  Do not take any vitamin and mineral supplements unless approved by your health care provider. Many nutritional supplements can worsen your kidney damage. General instructions  Follow your prescribed diet as told by your health care provider.  Do not use any products that contain nicotine or tobacco, such as cigarettes and e-cigarettes. If you need help quitting, ask your health care provider.  Monitor and track your blood pressure at home. Report changes in your blood pressure as told by your health care provider.  If you are being treated for diabetes, monitor and track your blood sugar (blood glucose) levels as told by your health care provider.  Maintain a healthy weight. If you need help with this, ask your health care provider.  Start or continue an exercise plan. Exercise at least 30 minutes a day, 5 days a week.  Keep your immunizations up to date as told by your health care provider.  Keep all follow-up visits as told by your health care provider. This is important. Where to find more information:  American Association of Kidney Patients: BombTimer.gl  National Kidney Foundation: www.kidney.[place]: https://mathis.com/  [facility]: www.lifeoptions.org and www.kidneyschool.org Contact a health care provider if:  Your symptoms get worse.  You develop new symptoms. Get help right away if:  You develop symptoms of ESRD, which  include: ? Headaches. ? Numbness in the hands or feet. ? Easy bruising. ? Frequent hiccups. ? Chest pain. ? Shortness of breath. ? Lack of menstruation, in women.  You have a fever.  You have decreased urine production.  You have pain or bleeding when you urinate. Summary  Chronic kidney disease (CKD) occurs when the kidneys become damaged slowly over a long period of time.  The most common causes of this condition are diabetes and high blood pressure (hypertension).  There is no cure for most cases of this condition, but treatment usually relieves symptoms and prevents or slows the progression of the disease. Treatment may include a combination of medicines and lifestyle changes. This information is not intended to replace advice given to you by your health care provider. Make sure you discuss any questions you have with your health care provider. Document Released: 10/08/2007 Document Revised: 02/06/2016 Document Reviewed: 02/06/2016 Elsevier Interactive Patient Education  2018 [employer] are compounds that affect the level of uric acid in your body. A low-purine diet is a diet that is low in purines. Eating a low-purine diet can prevent the level of uric acid in your body from getting too high and causing gout or kidney stones or both. What do I need to know about this diet?  Choose low-purine foods. Examples of low-purine foods are listed in the next section.  Drink plenty of fluids, especially water. Fluids can help remove uric acid from your body. Try to drink 8-16 cups (1.9-3.8 L) a day.  Limit foods high in fat, especially saturated fat, as fat makes it harder for the body to get rid of uric acid. Foods high in saturated fat include pizza, cheese, ice cream, whole milk, fried foods, and gravies. Choose foods that are lower in fat and lean sources of protein. Use olive oil when cooking as it contains healthy fats that are not high in saturated  fat.  Limit alcohol. Alcohol interferes with the elimination of uric acid from your body. If you are having a gout attack, avoid all alcohol.  Keep in mind that different people's bodies react differently to different foods. You will probably learn over time which foods do or do not affect you. If you discover that a food tends to cause your gout to flare up, avoid eating that food. You can more freely enjoy foods that do not cause problems. If you have any questions about a food item, talk to your dietitian or health care provider. Which foods are low, moderate, and high in purines? The following is a list of foods that are low, moderate, and high in purines. You can eat any amount of the foods that are low in purines. You may be able to have small amounts of foods that are moderate in purines. Ask your health care provider how much of a food moderate in purines you can have. Avoid foods high in purines. Grains  Foods low in purines: Enriched white bread, pasta, rice, cake, cornbread, popcorn.  Foods moderate in purines: Whole-grain breads and cereals, wheat germ, bran, oatmeal. Uncooked oatmeal. Dry wheat bran or wheat germ.  Foods high in purines: Pancakes, [place] toast, biscuits, muffins. Vegetables  Foods low in purines: All vegetables, except those that are moderate in purines.  Foods moderate in purines: Asparagus, cauliflower, spinach, mushrooms, green peas. Fruits  All fruits are low in purines. Meats and other Protein Foods  Foods low in purines: Eggs, nuts, peanut butter.  Foods moderate in purines: 80-90% lean beef, lamb, veal, pork, poultry, fish, eggs, peanut butter, nuts. Crab, lobster, oysters, and shrimp. Cooked dried beans, peas, and lentils.  Foods high in purines: Anchovies, sardines, herring, mussels, tuna, codfish, scallops, trout, and haddock. Berniece Salines. Organ meats (such as liver or kidney). Tripe. Game meat. Goose. Sweetbreads. Dairy  All dairy foods are low in  purines. Low-fat and fat-free dairy products are best because they are low in saturated fat. Beverages  Drinks low in purines: Water, carbonated beverages, tea, coffee, cocoa.  Drinks moderate in purines: Soft drinks and other drinks sweetened with high-fructose corn syrup. Juices. To find whether a food or drink is sweetened with high-fructose corn syrup, look at the ingredients list.  Drinks high in purines: Alcoholic beverages (such as beer). Condiments  Foods low in purines: Salt, herbs, olives, pickles, relishes, vinegar.  Foods moderate in purines: Butter, margarine, oils, mayonnaise. Fats and Oils  Foods low in purines: All types, except gravies and sauces made with meat.  Foods high in purines: Gravies and sauces made with meat. Other Foods  Foods low in purines: Sugars, sweets, gelatin. Cake. Soups made without meat.  Foods moderate in purines: Meat-based or fish-based soups, broths, or bouillons. Foods and drinks sweetened with high-fructose corn syrup.  Foods high in purines: High-fat desserts (such as ice  cream, cookies, cakes, pies, doughnuts, and chocolate). Contact your dietitian for more information on foods that are not listed here. This information is not intended to replace advice given to you by your health care provider. Make sure you discuss any questions you have with your health care provider. Document Released: 04/25/2010 Document Revised: 06/06/2015 Document Reviewed: 12/05/2012 Elsevier Interactive Patient Education  2017 [employer].

## 2017-04-13 NOTE — Assessment & Plan Note (Addendum)
Uric acid check today. Will consider stopping Colchicine and he is encouraged to stay well hydrated and report any flares. If he can stay off the medications it should help his renal functions.

## 2017-04-14 LAB — COMPREHENSIVE METABOLIC PANEL
ALBUMIN: 4.1 g/dL (ref 3.5–5.2)
ALT: 34 U/L (ref 0–53)
AST: 29 U/L (ref 0–37)
Alkaline Phosphatase: 62 U/L (ref 39–117)
BILIRUBIN TOTAL: 0.6 mg/dL (ref 0.2–1.2)
BUN: 15 mg/dL (ref 6–23)
CALCIUM: 10.3 mg/dL (ref 8.4–10.5)
CO2: 26 mEq/L (ref 19–32)
Chloride: 103 mEq/L (ref 96–112)
Creatinine, Ser: 1.43 mg/dL (ref 0.40–1.50)
GFR: 51.68 mL/min — ABNORMAL LOW (ref >60.00)
Glucose, Bld: 82 mg/dL (ref 70–99)
Potassium: 4.4 mEq/L (ref 3.5–5.1)
SODIUM: 138 meq/L (ref 135–145)
TOTAL PROTEIN: 7.3 g/dL (ref 6.0–8.3)

## 2017-04-14 LAB — URIC ACID: Uric Acid, Serum: 8.1 mg/dL — ABNORMAL HIGH (ref 4.0–7.8)

## 2017-04-14 LAB — T4, FREE: FREE T4: 0.75 ng/dL (ref 0.60–1.60)

## 2017-04-14 LAB — TSH: TSH: 2.98 u[IU]/mL (ref 0.35–4.50)

## 2017-04-15 DIAGNOSIS — R7989 Other specified abnormal findings of blood chemistry: Secondary | ICD-10-CM | POA: Insufficient documentation

## 2017-04-15 NOTE — Assessment & Plan Note (Signed)
Normalized on recheck free T4 normal

## 2017-04-15 NOTE — Assessment & Plan Note (Signed)
hgba1c acceptable, minimize simple carbs. Increase exercise as tolerated.  

## 2017-04-15 NOTE — Assessment & Plan Note (Signed)
Distant hsitorya nd last check it is wnl

## 2017-04-15 NOTE — Assessment & Plan Note (Signed)
Discussed need for adequate hydration. Avoid OTC preparatins without asking [place] and continue to monitor

## 2017-04-15 NOTE — Assessment & Plan Note (Addendum)
After reviewing his chart he is in today with numerous questions regarding his labs and diagnosis. He asks what hyperlipidemia and hypertriglyceridemia mean and he is given the definition. Visit was 45 minutes

## 2017-04-15 NOTE — Assessment & Plan Note (Signed)
Well controlled, no changes to meds. Encouraged heart healthy diet such as the DASH diet and exercise as tolerated.  °

## 2017-04-15 NOTE — Assessment & Plan Note (Signed)
Normalized. Minimize simple carbs

## 2017-05-17 ENCOUNTER — Encounter: Payer: Self-pay | Admitting: Medical

## 2017-05-17 ENCOUNTER — Ambulatory Visit: Payer: Medicare Other | Admitting: Medical

## 2017-05-17 VITALS — BP 134/69 | HR 62 | Temp 97.7°F | Resp 16 | Ht 72.0 in | Wt 250.4 lb

## 2017-05-17 DIAGNOSIS — T148XXA Other injury of unspecified body region, initial encounter: Secondary | ICD-10-CM

## 2017-05-17 MED ORDER — MUPIROCIN 2 % EX OINT: TOPICAL_OINTMENT | CUTANEOUS | 0 refills | Status: DC

## 2017-05-17 NOTE — Patient Instructions (Signed)
You do appear to have probable friction injury based on history and physical exam.  No ulcerations seen presently.  Does not look like a wart presently.  Would recommend applying mupirocin twice daily to the small scab area.  Also recommend applying a small piece of moleskin over both areas to prevent friction injury.  But at night would recommend leaving the area open to air.  Check the area daily for worsening or changing signs and symptoms.  Would expect the area to heal by about 10 days or sooner.  If not please let us know.  If the area changes or worsens let us know as well please

## 2017-05-17 NOTE — Progress Notes (Signed)
Subjective:    Patient ID: David [name], male    DOB: 1945-08-14, 72 y.o.   MRN: [id number]  HPI  Pt in with some sores to his rt great toe. No toe pain. No known trauma.Pt states no itching.   Blood work in March shows no diabetes.   Pt doe have 2nd toe hammer. He is not aware of any change in his gate.  Pt speculated that he got slight superficial abrasion  injury walking barefoot on his deck.  Pt has been using some neopsorin to the area.    Review of Systems  Constitutional: Negative for chills and fatigue.  Respiratory: Negative for cough, chest tightness, shortness of breath and wheezing.   Cardiovascular: Negative for chest pain and palpitations.  Gastrointestinal: Negative for abdominal pain.  Skin:       See hpi.  Neurological: Negative for seizures, facial asymmetry, speech difficulty, weakness and light-headedness.  Hematological: Negative for adenopathy. Does not bruise/bleed easily.   Past Medical History:  Diagnosis Date  . Abnormal liver function 03/18/2013  . Anxiety   . Arthritis of both knees 03/26/2016  . Atrial fibrillation ([place])   . Back pain   . BCC (basal cell carcinoma of skin)    under right eye and right ear  . Cataract 09/29/2016  . Cataracts, bilateral 03/26/2016  . Diverticulitis   . Diverticulosis   . GERD (gastroesophageal reflux disease)   . Gout   . Hearing loss of both ears 03/26/2016  . Hyperglycemia 03/29/2016  . Hyperlipidemia   . Hypertension   . IBS (irritable bowel syndrome) 03/18/2013  . Internal hemorrhoids   . LAFB (left anterior fascicular block)   . Low back pain 03/26/2016  . Medicare annual wellness visit, subsequent 03/12/2014  . Obesity 11/25/2007   Qualifier: Diagnosis of  By: Lenna Gilford MD, Deborra Medina   . Other malaise and fatigue 03/13/2013  . Premature ventricular complex   . Preventative health care 03/07/2015  . Rectal irritation 09/18/2012  . Tinea corporis 03/13/2013  . Unspecified constipation 09/25/2013     Social  History   Socioeconomic History  . Marital status: Married    Spouse name: Not on file  . Number of children: 3  . Years of education: Not on file  . Highest education level: Not on file  Occupational History  . Occupation: Nurse, adult: [employer]  . Financial resource strain: Not on file  . Food insecurity:    Worry: Not on file    Inability: Not on file  . Transportation needs:    Medical: Not on file    Non-medical: Not on file  Tobacco Use  . Smoking status: Former Smoker    Packs/day: 1.00    Years: 6.00    Pack years: 6.00    Types: Cigarettes    Start date: 01/12/1969  . Smokeless tobacco: Never Used  Substance and Sexual Activity  . Alcohol use: Yes    Comment: 2 drinks 5 nights a week  . Drug use: No  . Sexual activity: Yes  Lifestyle  . Physical activity:    Days per week: Not on file    Minutes per session: Not on file  . Stress: Not on file  Relationships  . Social connections:    Talks on phone: Not on file    Gets together: Not on file    Attends religious service: Not on file    Active member of club or  organization: Not on file    Attends meetings of clubs or organizations: Not on file    Relationship status: Not on file  . Intimate partner violence:    Fear of current or ex partner: Not on file    Emotionally abused: Not on file    Physically abused: Not on file    Forced sexual activity: Not on file  Other Topics Concern  . Not on file  Social History Narrative   The patient is married for the second time, he has 3 sons.   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   2 alcoholic beverages most days.   1 caffeinated beverage daily   No drug use no current tobacco use he is a prior smoker   12/24/2016    Past Surgical History:  Procedure Laterality Date  . COLON SURGERY  2009   segmental sigmoid resection  . COLONOSCOPY    . NOSE SURGERY    . SKIN BIOPSY      Family History  Problem Relation Age of Onset  . Heart disease  Mother   . Hypertension Mother   . Stroke Mother   . Colon cancer Mother   . Breast cancer Mother   . Heart disease Father        pacemaker  . Aortic aneurysm Father   . Hypertension Father   . Heart disease Sister   . Atrial fibrillation Sister   . Obesity Sister   . Sleep apnea Sister   . Heart attack Brother   . Other Brother        muscle disease  . Arthritis Brother   . Stroke Brother   . Atrial fibrillation Brother   . Cancer Maternal Grandmother        ?  Marland Kitchen Heart attack Maternal Grandmother   . Diabetes Maternal Grandmother   . Cancer Maternal Grandfather        hodgin's lymphoma  . Heart attack Paternal Grandmother   . Anxiety disorder Paternal Grandmother   . Pneumonia Paternal Grandfather   . Heart attack Brother   . Atrial fibrillation Brother   . Atrial fibrillation Brother   . Heart attack Brother   . Hypertension Brother   . Hyperlipidemia Brother   . Heart attack Brother   . Other Brother        heart valve operation  . Atrial fibrillation Brother     Allergies  Allergen Reactions  . Cefaclor Hives  . Cephalosporins Hives  . Penicillins Rash    Mild maculopapular rash    Current Outpatient Medications on File Prior to Visit  Medication Sig Dispense Refill  . alum hydroxide-mag trisilicate (GAVISCON) 41-32 MG CHEW Chew by mouth.    . Ascorbic Acid (VITAMIN C) 1000 MG tablet Take 1,000 mg by mouth daily.    Marland Kitchen aspirin 81 MG tablet Take 81 mg by mouth daily as needed.     Marland Kitchen atenolol (TENORMIN) 25 MG tablet TAKE ONE TABLET BY MOUTH TWICE A DAY 60 tablet 4  . atorvastatin (LIPITOR) 40 MG tablet Take 1 tablet (40 mg total) by mouth at bedtime. 30 tablet 5  . Cholecalciferol (VITAMIN D-3) 1000 units CAPS Take 1 capsule (1,000 Units total) by mouth daily. 60 capsule   . cyclobenzaprine (FLEXERIL) 5 MG tablet Take 1 tablet (5 mg total) by mouth 3 (three) times daily as needed for muscle spasms. 30 tablet 1  . fluticasone (FLONASE) 50 MCG/ACT nasal spray  Place 2 sprays into the  nose daily as needed. For congestion    . hydrocortisone (PROCTOZONE-HC) 2.5 % rectal cream Place 1 application rectally as needed. 30 g 2  . irbesartan (AVAPRO) 150 MG tablet Take 1 tablet (150 mg total) by mouth daily. 30 tablet 3  . LORazepam (ATIVAN) 1 MG tablet TAKE ONE TABLET BY MOUTH EVERY 8 HOURS AS NEEDED FOR ANXIETY OR SLEEP 90 tablet 0  . omeprazole (PRILOSEC) 20 MG capsule TAKE ONE CAPSULE BY MOUTH DAILY 30 capsule 5  . triamcinolone lotion (KENALOG) 0.1 % Apply 1 application topically 3 (three) times daily. 120 mL 0   No current facility-administered medications on file prior to visit.     BP 134/69 (BP Location: Right Arm, Cuff Size: Large)   Pulse 62   Temp 97.7 F (36.5 C) (Oral)   Resp 16   Ht 6' (1.829 m)   Wt 250 lb 6.4 oz (113.6 kg)   SpO2 100%   BMI 33.96 kg/m       Objective:   Physical Exam  General- No acute distress. Pleasant patient. Rt foot- one small area of peeled epidermis. About 3 mm x 2 mm. Other area small scab 1 mm x 14mm. No ulceration. No yellow dc.        Assessment & Plan:  You do appear to have probable friction injury based on history and physical exam.  No ulcerations seen presently.  Does not look like a wart presently(pt speculated maybe was wart).  Would recommend applying mupirocin twice daily to the small scab area.  Also recommend applying a small piece of moleskin over both areas to prevent friction injury.  But at night would recommend leaving the area open to air.  Check the area daily for worsening or changing signs and symptoms.  Would expect the area to heal by about 10 days or sooner.  If not please let us know.  If the area changes or worsens let us know as well please.  Mackie Pai, PA-C

## 2017-06-08 ENCOUNTER — Encounter: Payer: Self-pay | Admitting: Internal Medicine

## 2017-06-11 ENCOUNTER — Other Ambulatory Visit: Payer: Self-pay | Admitting: Family Medicine

## 2017-07-05 ENCOUNTER — Other Ambulatory Visit: Payer: Self-pay | Admitting: Family Medicine

## 2017-07-05 DIAGNOSIS — K760 Fatty (change of) liver, not elsewhere classified: Secondary | ICD-10-CM

## 2017-07-05 DIAGNOSIS — E782 Mixed hyperlipidemia: Secondary | ICD-10-CM

## 2017-07-05 DIAGNOSIS — N289 Disorder of kidney and ureter, unspecified: Secondary | ICD-10-CM

## 2017-07-05 DIAGNOSIS — I4821 Permanent atrial fibrillation: Secondary | ICD-10-CM

## 2017-07-05 DIAGNOSIS — E559 Vitamin D deficiency, unspecified: Secondary | ICD-10-CM

## 2017-07-06 NOTE — Telephone Encounter (Signed)
Refill Request: Lorazepam  Last RX:03/11/17  Last OV: 05/18/14 Next OV: 09/28/17 UDS:03/30/17 CSC: 03/30/17

## 2017-07-19 ENCOUNTER — Telehealth: Payer: Self-pay | Admitting: Family Medicine

## 2017-07-19 NOTE — Telephone Encounter (Signed)
Copied from [place] [phone]. Topic: Quick Communication - See Telephone Encounter >> Jul 19, 2017  2:20 PM Conception Chancy, NT wrote: CRM for notification. See Telephone encounter for: 07/19/17.  Patient is calling and requesting a refill on cyclobenzaprine (FLEXERIL) 5 MG tablet. Please advise.  [facility] at [place], [place] - [address] [place] [address] Phone: [phone] Fax: [phone]

## 2017-07-20 ENCOUNTER — Telehealth: Payer: Self-pay

## 2017-07-20 MED ORDER — CYCLOBENZAPRINE HCL 5 MG PO TABS: 5.0000 mg | ORAL_TABLET | Freq: Three times a day (TID) | ORAL | 1 refills | Status: DC | PRN

## 2017-07-20 NOTE — Telephone Encounter (Signed)
PA approved. Effective from 07/20/2017 through 07/21/2018

## 2017-07-20 NOTE — Telephone Encounter (Signed)
Flexeril 5mg  refill Last Refill: 06/06/16 #30 with 1 refill Last OV: 04/13/17 PCP: Dr. Charlett Blake Pharmacy: [facility] at [facility]

## 2017-07-20 NOTE — Telephone Encounter (Signed)
Medication sent in. 

## 2017-07-20 NOTE — Telephone Encounter (Signed)
PA initiated via Covermymeds; KEY: [id number]. Awaiting determination.

## 2017-07-27 ENCOUNTER — Other Ambulatory Visit: Payer: Self-pay | Admitting: *Deleted

## 2017-07-27 MED ORDER — IRBESARTAN 150 MG PO TABS: 150.0000 mg | ORAL_TABLET | Freq: Every day | ORAL | 1 refills | Status: DC

## 2017-07-31 ENCOUNTER — Emergency Department (HOSPITAL_BASED_OUTPATIENT_CLINIC_OR_DEPARTMENT_OTHER)
Admission: EM | Admit: 2017-07-31 | Discharge: 2017-07-31 | Disposition: A | Discharge status: Home / Self Care | Payer: Medicare Other | Attending: Emergency Medicine | Admitting: Emergency Medicine

## 2017-07-31 ENCOUNTER — Encounter (HOSPITAL_BASED_OUTPATIENT_CLINIC_OR_DEPARTMENT_OTHER): Payer: Self-pay | Admitting: Emergency Medicine

## 2017-07-31 ENCOUNTER — Emergency Department (HOSPITAL_BASED_OUTPATIENT_CLINIC_OR_DEPARTMENT_OTHER): Payer: Medicare Other

## 2017-07-31 ENCOUNTER — Other Ambulatory Visit: Payer: Self-pay

## 2017-07-31 DIAGNOSIS — Z87891 Personal history of nicotine dependence: Secondary | ICD-10-CM | POA: Insufficient documentation

## 2017-07-31 DIAGNOSIS — R002 Palpitations: Secondary | ICD-10-CM | POA: Diagnosis present

## 2017-07-31 DIAGNOSIS — I4891 Unspecified atrial fibrillation: Secondary | ICD-10-CM

## 2017-07-31 DIAGNOSIS — Z79899 Other long term (current) drug therapy: Secondary | ICD-10-CM | POA: Insufficient documentation

## 2017-07-31 DIAGNOSIS — I10 Essential (primary) hypertension: Secondary | ICD-10-CM | POA: Insufficient documentation

## 2017-07-31 DIAGNOSIS — I480 Paroxysmal atrial fibrillation: Secondary | ICD-10-CM | POA: Insufficient documentation

## 2017-07-31 HISTORY — PX: CARDIOVERSION: SHX1299

## 2017-07-31 LAB — COMPREHENSIVE METABOLIC PANEL
ALK PHOS: 72 U/L (ref 38–126)
ALT: 23 U/L (ref 0–44)
AST: 28 U/L (ref 15–41)
Albumin: 4.3 g/dL (ref 3.5–5.0)
Anion gap: 10 (ref 5–15)
BILIRUBIN TOTAL: 1 mg/dL (ref 0.3–1.2)
BUN: 18 mg/dL (ref 8–23)
CALCIUM: 9.6 mg/dL (ref 8.9–10.3)
CO2: 23 mmol/L (ref 22–32)
CREATININE: 1.58 mg/dL — AB (ref 0.61–1.24)
Chloride: 105 mmol/L (ref 98–111)
GFR calc non Af Amer: 42 mL/min — ABNORMAL LOW (ref >60)
GFR, EST AFRICAN AMERICAN: 49 mL/min — AB (ref >60)
Glucose, Bld: 167 mg/dL — ABNORMAL HIGH (ref 70–99)
Potassium: 3.8 mmol/L (ref 3.5–5.1)
SODIUM: 138 mmol/L (ref 135–145)
TOTAL PROTEIN: 7.9 g/dL (ref 6.5–8.1)

## 2017-07-31 LAB — CBC WITH DIFFERENTIAL/PLATELET
Basophils Absolute: 0 10*3/uL (ref 0.0–0.1)
Basophils Relative: 0 %
EOS ABS: 0.3 10*3/uL (ref 0.0–0.7)
EOS PCT: 4 %
HCT: 43.6 % (ref 39.0–52.0)
Hemoglobin: 15.2 g/dL (ref 13.0–17.0)
LYMPHS ABS: 1.9 10*3/uL (ref 0.7–4.0)
LYMPHS PCT: 27 %
MCH: 32.3 pg (ref 26.0–34.0)
MCHC: 34.9 g/dL (ref 30.0–36.0)
MCV: 92.6 fL (ref 78.0–100.0)
MONO ABS: 0.6 10*3/uL (ref 0.1–1.0)
Monocytes Relative: 9 %
Neutro Abs: 4.3 10*3/uL (ref 1.7–7.7)
Neutrophils Relative %: 60 %
PLATELETS: 195 10*3/uL (ref 150–400)
RBC: 4.71 MIL/uL (ref 4.22–5.81)
RDW: 13.6 % (ref 11.5–15.5)
WBC: 7.1 10*3/uL (ref 4.0–10.5)

## 2017-07-31 LAB — MAGNESIUM: Magnesium: 2.2 mg/dL (ref 1.7–2.4)

## 2017-07-31 MED ORDER — DILTIAZEM HCL 25 MG/5ML IV SOLN
20.0000 mg | Freq: Once | INTRAVENOUS | Status: AC
  Administered 2017-07-31: 20 mg via INTRAVENOUS
  Filled 2017-07-31: qty 5

## 2017-07-31 MED ORDER — RIVAROXABAN 20 MG PO TABS
20.0000 mg | ORAL_TABLET | Freq: Once | ORAL | Status: AC
  Administered 2017-07-31: 20 mg via ORAL
  Filled 2017-07-31: qty 1

## 2017-07-31 MED ORDER — PROPOFOL 10 MG/ML IV BOLUS
0.5000 mg/kg | Freq: Once | INTRAVENOUS | Status: DC
  Filled 2017-07-31: qty 20

## 2017-07-31 MED ORDER — RIVAROXABAN 20 MG PO TABS: 20.0000 mg | ORAL_TABLET | Freq: Every day | ORAL | 0 refills | Status: DC

## 2017-07-31 MED ORDER — PROPOFOL 10 MG/ML IV BOLUS
INTRAVENOUS | Status: AC | PRN
  Administered 2017-07-31: 60 mg via INTRAVENOUS

## 2017-07-31 NOTE — ED Notes (Signed)
ED Provider at bedside. 

## 2017-07-31 NOTE — ED Provider Notes (Signed)
[facility] EMERGENCY DEPARTMENT Provider Note   CSN: [id number] Arrival date & time: 07/31/17  1259     History   Chief Complaint Chief Complaint  Patient presents with  . Palpitations    HPI David [name] is a 72 y.o. male.  The history is provided by the patient.  Palpitations   This is a new problem. Episode onset: 20 min ago. The problem occurs constantly. The problem has not changed since onset.The problem is associated with an unknown (Patient does state he recently started a new job and is been under more stress than usual and has been drinking more caffeine than usual.) factor. On average, each episode lasts 20 minutes. Associated symptoms include shortness of breath. Pertinent negatives include no fever, no chest pain, no chest pressure, no near-syncope, no syncope, no abdominal pain, no nausea, no leg pain, no lower extremity edema, no weakness and no cough. He has tried nothing for the symptoms. The treatment provided no relief. Risk factors include being male. Past medical history comments: Higher history of paroxysmal atrial fibrillation which spontaneously converted in 2012.  No episodes since that time..    Past Medical History:  Diagnosis Date  . Abnormal liver function 03/18/2013  . Anxiety   . Arthritis of both knees 03/26/2016  . Atrial fibrillation ([place])   . Back pain   . BCC (basal cell carcinoma of skin)    under right eye and right ear  . Cataract 09/29/2016  . Cataracts, bilateral 03/26/2016  . Diverticulitis   . Diverticulosis   . GERD (gastroesophageal reflux disease)   . Gout   . Hearing loss of both ears 03/26/2016  . Hyperglycemia 03/29/2016  . Hyperlipidemia   . Hypertension   . IBS (irritable bowel syndrome) 03/18/2013  . Internal hemorrhoids   . LAFB (left anterior fascicular block)   . Low back pain 03/26/2016  . Medicare annual wellness visit, subsequent 03/12/2014  . Obesity 11/25/2007   Qualifier: Diagnosis of  By: Lenna Gilford MD,  Deborra Medina   . Other malaise and fatigue 03/13/2013  . Premature ventricular complex   . Preventative health care 03/07/2015  . Rectal irritation 09/18/2012  . Tinea corporis 03/13/2013  . Unspecified constipation 09/25/2013    Patient Active Problem List   Diagnosis Date Noted  . Abnormal TSH 04/15/2017  . Cataract 09/29/2016  . Muscle cramp 09/29/2016  . Nocturia 09/29/2016  . Hyperglycemia 03/29/2016  . Low back pain 03/26/2016  . Cataracts, bilateral 03/26/2016  . Arthritis of both knees 03/26/2016  . Hearing loss 03/26/2016  . Complex renal cyst 06/16/2015  . History of atrial fibrillation 06/16/2015  . Snoring 06/07/2015  . Somnolence 06/07/2015  . Preventative health care 03/07/2015  . Lipoma of axilla 06/04/2014  . Tinea cruris 03/20/2014  . Lichen simplex chronicus 03/20/2014  . Medicare annual wellness visit, subsequent 03/12/2014  . Depression with anxiety 09/25/2013  . Constipation 09/25/2013  . Gouty arthritis of toe of left foot 08/01/2013  . Vitamin D deficiency 06/18/2013  . Skin rash 05/27/2013  . Abnormal liver function 03/18/2013  . IBS (irritable bowel syndrome) 03/18/2013  . Other malaise and fatigue 03/13/2013  . Tinea corporis 03/13/2013  . Gout 12/14/2012  . Diarrhea 12/14/2012  . Renal insufficiency 12/14/2012  . Rectal irritation 09/18/2012  . Diverticulosis   . BCC (basal cell carcinoma of skin)   . Scoliosis 01/19/2011  . Atrial fibrillation ([place]) 11/27/2010  . Hyperlipidemia, mixed 03/11/2010  . Essential hypertension, benign 03/11/2010  .  ALLERGIC RHINITIS 03/11/2010  . Obesity 11/25/2007  . BENIGN NEOPLASM OF ADRENAL GLAND 09/22/2007  . Fatty liver disease, nonalcoholic 65/99/3570  . Osteoarthritis 09/22/2007  . SPONDYLOSIS, LUMBAR 09/22/2007  . GERD 04/12/2007    Past Surgical History:  Procedure Laterality Date  . COLON SURGERY  2009   segmental sigmoid resection  . COLONOSCOPY    . NOSE SURGERY    . SKIN BIOPSY          Home  Medications    Prior to Admission medications   Medication Sig Start Date End Date Taking? Authorizing Provider  alum hydroxide-mag trisilicate (GAVISCON) 17-79 MG CHEW Chew by mouth.   Yes [provider]  aspirin 81 MG tablet Take 81 mg by mouth daily as needed.    Yes [provider]  atenolol (TENORMIN) 25 MG tablet TAKE ONE TABLET BY MOUTH TWICE A DAY 06/11/17  Yes Mosie Lukes, MD  irbesartan (AVAPRO) 150 MG tablet Take 1 tablet (150 mg total) by mouth daily. 07/27/17  Yes Mosie Lukes, MD  LORazepam (ATIVAN) 1 MG tablet TAKE ONE TABLET BY MOUTH EVERY 8 HOURS AS NEEDED FOR SLEEP OR ANXIETY 07/06/17  Yes Mosie Lukes, MD  mupirocin ointment (BACTROBAN) 2 % Apply to area twice daily. 05/17/17  Yes Saguier, Percell Miller, PA-C  Ascorbic Acid (VITAMIN C) 1000 MG tablet Take 1,000 mg by mouth daily.    [provider]  Cholecalciferol (VITAMIN D-3) 1000 units CAPS Take 1 capsule (1,000 Units total) by mouth daily. 09/29/16   Mosie Lukes, MD  cyclobenzaprine (FLEXERIL) 5 MG tablet Take 1 tablet (5 mg total) by mouth 3 (three) times daily as needed for muscle spasms. 07/20/17   Mosie Lukes, MD  fluticasone (FLONASE) 50 MCG/ACT nasal spray Place 2 sprays into the nose daily as needed. For congestion    [provider]  omeprazole (PRILOSEC) 20 MG capsule TAKE ONE CAPSULE BY MOUTH DAILY 10/13/16   Mosie Lukes, MD  triamcinolone lotion (KENALOG) 0.1 % Apply 1 application topically 3 (three) times daily. 02/12/17   Mosie Lukes, MD    Family History Family History  Problem Relation Age of Onset  . Heart disease Mother   . Hypertension Mother   . Stroke Mother   . Colon cancer Mother   . Breast cancer Mother   . Heart disease Father        pacemaker  . Aortic aneurysm Father   . Hypertension Father   . Heart disease Sister   . Atrial fibrillation Sister   . Obesity Sister   . Sleep apnea Sister   . Heart attack Brother   . Other Brother         muscle disease  . Arthritis Brother   . Stroke Brother   . Atrial fibrillation Brother   . Cancer Maternal Grandmother        ?  Marland Kitchen Heart attack Maternal Grandmother   . Diabetes Maternal Grandmother   . Cancer Maternal Grandfather        hodgin's lymphoma  . Heart attack Paternal Grandmother   . Anxiety disorder Paternal Grandmother   . Pneumonia Paternal Grandfather   . Heart attack Brother   . Atrial fibrillation Brother   . Atrial fibrillation Brother   . Heart attack Brother   . Hypertension Brother   . Hyperlipidemia Brother   . Heart attack Brother   . Other Brother        heart valve operation  .  Atrial fibrillation Brother     Social History Social History   Tobacco Use  . Smoking status: Former Smoker    Packs/day: 1.00    Years: 6.00    Pack years: 6.00    Types: Cigarettes    Start date: 01/12/1969  . Smokeless tobacco: Never Used  Substance Use Topics  . Alcohol use: Yes    Comment: 2 drinks 5 nights a week  . Drug use: No     Allergies   Cefaclor; Cephalosporins; and Penicillins   Review of Systems Review of Systems  Constitutional: Negative for fever.  Respiratory: Positive for shortness of breath. Negative for cough.   Cardiovascular: Positive for palpitations. Negative for chest pain, syncope and near-syncope.  Gastrointestinal: Negative for abdominal pain and nausea.  Neurological: Negative for weakness.  All other systems reviewed and are negative.    Physical Exam Updated Vital Signs BP (!) 138/99   Pulse (!) 134   Temp 98 F (36.7 C) (Oral)   Resp 20   Ht 6' (1.829 m)   Wt 111.1 kg (245 lb)   SpO2 96%   BMI 33.23 kg/m   Physical Exam  Constitutional: He is oriented to person, place, and time. He appears well-developed and well-nourished. No distress.  HENT:  Head: Normocephalic and atraumatic.  Mouth/Throat: Oropharynx is clear and moist.  Eyes: Pupils are equal, round, and reactive to light. Conjunctivae and EOM are  normal.  Neck: Normal range of motion. Neck supple.  Cardiovascular: Intact distal pulses. An irregularly irregular rhythm present. Tachycardia present.  No murmur heard. Pulmonary/Chest: Effort normal and breath sounds normal. No respiratory distress. He has no wheezes. He has no rales.  Abdominal: Soft. He exhibits no distension. There is no tenderness. There is no rebound and no guarding.  Musculoskeletal: Normal range of motion. He exhibits no edema or tenderness.  Neurological: He is alert and oriented to person, place, and time.  Skin: Skin is warm and dry. No rash noted. No erythema.  Psychiatric: He has a normal mood and affect. His behavior is normal.  Nursing note and vitals reviewed.    ED Treatments / Results  Labs (all labs ordered are listed, but only abnormal results are displayed) Labs Reviewed  COMPREHENSIVE METABOLIC PANEL - Abnormal; Notable for the following components:      Result Value   Glucose, Bld 167 (*)    Creatinine, Ser 1.58 (*)    GFR calc non Af Amer 42 (*)    GFR calc Af Amer 49 (*)    All other components within normal limits  CBC WITH DIFFERENTIAL/PLATELET  MAGNESIUM    EKG EKG Interpretation  Date/Time:  Saturday July 31 2017 13:06:38 EDT Ventricular Rate:  130 PR Interval:    QRS Duration: 98 QT Interval:  339 QTC Calculation: 499 R Axis:   -63 Text Interpretation:  recurrent Atrial fibrillation Left anterior fascicular block Abnormal R-wave progression, late transition Borderline ST depression, diffuse leads Borderline prolonged QT interval Confirmed by Blanchie Dessert ([id number]) on 07/31/2017 1:54:05 PM   Radiology Dg Chest Port 1 View  Result Date: 07/31/2017 CLINICAL DATA:  Atrial fibrillation. EXAM: PORTABLE CHEST 1 VIEW COMPARISON:  09/05/2013 FINDINGS: The cardiac silhouette, mediastinal and hilar contours are within normal limits and stable. Mild tortuosity of the thoracic aorta. The lungs are clear. No pleural effusion. The bony  thorax is intact. IMPRESSION: No acute cardiopulmonary findings. Electronically Signed   By: Marijo Sanes M.D.   On: 07/31/2017 13:55  Procedures .Sedation Date/Time: 07/31/2017 3:16 PM Performed by: Blanchie Dessert, MD Authorized by: Blanchie Dessert, MD   Consent:    Consent obtained:  Verbal   Consent given by:  Patient   Risks discussed:  Allergic reaction, dysrhythmia, inadequate sedation, nausea, prolonged hypoxia resulting in organ damage, prolonged sedation necessitating reversal, respiratory compromise necessitating ventilatory assistance and intubation and vomiting   Alternatives discussed:  Analgesia without sedation, anxiolysis and regional anesthesia Universal protocol:    Procedure explained and questions answered to patient or proxy's satisfaction: yes     Relevant documents present and verified: yes     Test results available and properly labeled: yes     Imaging studies available: yes     Required blood products, implants, devices, and special equipment available: yes     Site/side marked: yes     Immediately prior to procedure a time out was called: yes     Patient identity confirmation method:  Verbally with patient Indications:    Procedure performed:  Cardioversion   Procedure necessitating sedation performed by:  Physician performing sedation   Intended level of sedation:  Deep Pre-sedation assessment:    Time since last food or drink:  4hrs   ASA classification: class 2 - patient with mild systemic disease     Neck mobility: normal     Mouth opening:  3 or more finger widths   Thyromental distance:  3 finger widths   Mallampati score:  II - soft palate, uvula, fauces visible   Pre-sedation assessments completed and reviewed: airway patency, cardiovascular function, hydration status, mental status, nausea/vomiting, pain level, respiratory function and temperature     Pre-sedation assessment completed:  07/31/2017 2:17 PM Immediate pre-procedure details:      Reassessment: Patient reassessed immediately prior to procedure     Reviewed: vital signs, relevant labs/tests and NPO status     Verified: bag valve mask available, emergency equipment available, intubation equipment available, IV patency confirmed, oxygen available and suction available   Procedure details (see MAR for exact dosages):    Preoxygenation:  Nasal cannula   Sedation:  Propofol   Analgesia:  None   Intra-procedure monitoring:  Blood pressure monitoring, cardiac monitor, continuous pulse oximetry, frequent LOC assessments, frequent vital sign checks and continuous capnometry   Intra-procedure events: none     Total Provider sedation time (minutes):  10 Post-procedure details:    Attendance: Constant attendance by certified staff until patient recovered     Recovery: Patient returned to pre-procedure baseline     Post-sedation assessments completed and reviewed: airway patency, cardiovascular function, hydration status, mental status, nausea/vomiting, pain level, respiratory function and temperature     Patient is stable for discharge or admission: yes     Patient tolerance:  Tolerated well, no immediate complications .Cardioversion Date/Time: 07/31/2017 3:18 PM Performed by: Blanchie Dessert, MD Authorized by: Blanchie Dessert, MD   Consent:    Consent obtained:  Verbal   Consent given by:  Patient   Risks discussed:  Induced arrhythmia and pain   Alternatives discussed:  Anti-coagulation medication and rate-control medication Pre-procedure details:    Cardioversion basis:  Elective   Rhythm:  Atrial fibrillation   Electrode placement:  Anterior-posterior Patient sedated: Yes. Refer to sedation procedure documentation for details of sedation.  Attempt one:    Cardioversion mode:  Synchronous   Waveform:  Biphasic   Shock (Joules):  120   Shock outcome:  Conversion to normal sinus rhythm Post-procedure details:    Patient status:  Awake   Patient tolerance of  procedure:  Tolerated well, no immediate complications   (including critical care time)  Medications Ordered in ED Medications  diltiazem (CARDIZEM) injection 20 mg (20 mg Intravenous Given 07/31/17 1325)     Initial Impression / Assessment and Plan / ED Course  I have reviewed the triage vital signs and the nursing notes.  Pertinent labs & imaging results that were available during my care of the patient were reviewed by me and considered in my medical decision making (see chart for details).     Patient presenting with complaint of palpitations that started approximately 20 minutes prior to arrival.  He has a history of atrial fibrillation in the past last episode was in 2012.  Patient is not currently on anticoagulation but does take an aspirin daily.  He states his heart feels like it is racing and skipping beats and it makes him feel slightly short of breath.  He denies any chest pain, abdominal pain, nausea or vomiting.  Patient states he has been under more stress lately because of starting a new job and has been drinking more caffeine than he should.  Also this morning he initially forgot to take his medications but took an atenolol about an hour prior to arrival. Patient was given a Cardizem bolus which controlled his heart rate but he is still in atrial fibrillation.  Electrolytes, CBC, CMP, magnesium all without acute findings.  Patient's chest x-ray is within normal limits.  Given patient's prior history of atrial fibrillation.  Known that he has been in sinus rhythm for quite some time and symptoms starting only 20 minutes ago without prior history in the last month of palpitations feel that it would be reasonable to cardiovert the patient to sinus rhythm.  Pt is Chadvasc of 2 and will start anticoagulation until f/u with a.fib clinic.  CHA2DS2/VAS Stroke Risk Points  Current as of 11 minutes ago     2 >= 2 Points: High Risk  1 - 1.99 Points: Medium Risk  0 Points: Low Risk     This is the only CHA2DS2/VAS Stroke Risk Points available for the past  year.:  Last Change: N/A     Details    This score determines the patient's risk of having a stroke if the  patient has atrial fibrillation.       Points Metrics  0 Has Congestive Heart Failure:  No    Current as of 11 minutes ago  0 Has Vascular Disease:  No    Current as of 11 minutes ago  1 Has Hypertension:  Yes    Current as of 11 minutes ago  1 Age:  77    Current as of 11 minutes ago  0 Has Diabetes:  No    Current as of 11 minutes ago  0 Had Stroke:  No  Had TIA:  No  Had thromboembolism:  No    Current as of 11 minutes ago  0 Male:  No    Current as of 11 minutes ago       3:18 PM Cardizem caused rate control but patient is still in atrial fibrillation.  Her discussing risk and benefits with the patient he has decided to proceed with cardioversion.  Patient was sedated and cardioverted with 1 synchronous shock.  He remains in sinus rhythm.  He was given Xarelto here and a prescription to go home with.  He was encouraged to continue taking his atenolol.  Plan for  follow-up in A. fib clinic who should contact the patient by Monday.   CRITICAL CARE Performed by: Jaheim Canino Total critical care time: 30 minutes Critical care time was exclusive of separately billable procedures and treating other patients. Critical care was necessary to treat or prevent imminent or life-threatening deterioration. Critical care was time spent personally by me on the following activities: development of treatment plan with patient and/or surrogate as well as nursing, discussions with consultants, evaluation of patient's response to treatment, examination of patient, obtaining history from patient or surrogate, ordering and performing treatments and interventions, ordering and review of laboratory studies, ordering and review of radiographic studies, pulse oximetry and re-evaluation of patient's condition.   Final  Clinical Impressions(s) / ED Diagnoses   Final diagnoses:  Atrial fibrillation with [facility])    ED Discharge Orders        Ordered    Amb referral to AFIB Clinic     07/31/17 1418    rivaroxaban (XARELTO) 20 MG TABS tablet  Daily with supper     07/31/17 1520       Blanchie Dessert, MD 07/31/17 [phone]

## 2017-07-31 NOTE — ED Notes (Signed)
HR drops from 130's to 70's after Cardizem IV was given.  HR rhythm is still afib.

## 2017-07-31 NOTE — Sedation Documentation (Signed)
Unable to assess pain due to sedation.  

## 2017-07-31 NOTE — Discharge Instructions (Addendum)
Start taking the blood thinner which she will take every night with your evening meal.  Make sure you eat something when you take it.  Continue taking your atenolol and avoid caffeine.  Return if your symptoms return.  Stop taking your aspirin for the time being until you see the cardiologist.

## 2017-07-31 NOTE — ED Triage Notes (Signed)
Hx of a-fib, c/o palpitations and SOB x 30 min.

## 2017-07-31 NOTE — Progress Notes (Signed)
Pt placed on ETCO2 monitoring cannula for cardioversion procedure.  Pt placed on 2L  with monitoring of CO2 from 23-27.  Pt tolerated well.  RT will continue to monitor.

## 2017-08-04 ENCOUNTER — Telehealth: Payer: Self-pay | Admitting: *Deleted

## 2017-08-04 NOTE — Telephone Encounter (Signed)
Pt in Pv this morning- 7-20 pt had to be seen in the Ed for new onset A fib- he had A Fib in 2012 had cardioversion- he woke 7-20 felt bad, to ED, dx with A Fib- had cardioversion and in 4 hours A Fib had subsided he was started on Xarelto x 30 days on the 20 th- he is to see the A Fib clinic in the next month and then possibly cadiology- Pt informed due to new onset, we need to delay his colon- explained to him he will need to be off Xarelto at least 2 days prior to a procedure here and if he is to remain on the Xarelto, he will need an OV with the PA or MD to be sure it is safe to come off. Pt verbalized understanding- He is aware if further cardiac testing is needed, he will have to have total cardiac clearance before any procedure here in the [place]. Pt to call and let me know the plan after he sees the Afib clinic   Lelan Pons

## 2017-08-06 ENCOUNTER — Ambulatory Visit (HOSPITAL_BASED_OUTPATIENT_CLINIC_OR_DEPARTMENT_OTHER)
Admission: RE | Admit: 2017-08-06 | Discharge: 2017-08-06 | Disposition: A | Discharge status: Home / Self Care | Payer: Medicare Other | Source: Ambulatory Visit | Attending: Nurse Practitioner | Admitting: Nurse Practitioner

## 2017-08-06 ENCOUNTER — Ambulatory Visit (HOSPITAL_COMMUNITY)
Admission: RE | Admit: 2017-08-06 | Discharge: 2017-08-06 | Disposition: A | Discharge status: Home / Self Care | Payer: Medicare Other | Source: Ambulatory Visit | Attending: Nurse Practitioner | Admitting: Nurse Practitioner

## 2017-08-06 ENCOUNTER — Encounter (HOSPITAL_COMMUNITY): Payer: Self-pay | Admitting: Nurse Practitioner

## 2017-08-06 ENCOUNTER — Telehealth: Payer: Self-pay | Admitting: Cardiology

## 2017-08-06 VITALS — BP 136/86 | HR 59 | Ht 72.0 in | Wt 246.0 lb

## 2017-08-06 DIAGNOSIS — Z7901 Long term (current) use of anticoagulants: Secondary | ICD-10-CM | POA: Diagnosis not present

## 2017-08-06 DIAGNOSIS — Z79899 Other long term (current) drug therapy: Secondary | ICD-10-CM | POA: Diagnosis not present

## 2017-08-06 DIAGNOSIS — Z880 Allergy status to penicillin: Secondary | ICD-10-CM | POA: Diagnosis not present

## 2017-08-06 DIAGNOSIS — M109 Gout, unspecified: Secondary | ICD-10-CM | POA: Diagnosis not present

## 2017-08-06 DIAGNOSIS — Z881 Allergy status to other antibiotic agents status: Secondary | ICD-10-CM | POA: Insufficient documentation

## 2017-08-06 DIAGNOSIS — K589 Irritable bowel syndrome without diarrhea: Secondary | ICD-10-CM | POA: Diagnosis not present

## 2017-08-06 DIAGNOSIS — K219 Gastro-esophageal reflux disease without esophagitis: Secondary | ICD-10-CM | POA: Insufficient documentation

## 2017-08-06 DIAGNOSIS — Z87891 Personal history of nicotine dependence: Secondary | ICD-10-CM | POA: Diagnosis not present

## 2017-08-06 DIAGNOSIS — E785 Hyperlipidemia, unspecified: Secondary | ICD-10-CM | POA: Diagnosis not present

## 2017-08-06 DIAGNOSIS — I480 Paroxysmal atrial fibrillation: Secondary | ICD-10-CM | POA: Diagnosis not present

## 2017-08-06 DIAGNOSIS — I10 Essential (primary) hypertension: Secondary | ICD-10-CM | POA: Diagnosis not present

## 2017-08-06 DIAGNOSIS — F419 Anxiety disorder, unspecified: Secondary | ICD-10-CM | POA: Insufficient documentation

## 2017-08-06 DIAGNOSIS — E669 Obesity, unspecified: Secondary | ICD-10-CM | POA: Diagnosis not present

## 2017-08-06 DIAGNOSIS — Z85828 Personal history of other malignant neoplasm of skin: Secondary | ICD-10-CM | POA: Diagnosis not present

## 2017-08-06 DIAGNOSIS — I444 Left anterior fascicular block: Secondary | ICD-10-CM

## 2017-08-06 DIAGNOSIS — R001 Bradycardia, unspecified: Secondary | ICD-10-CM

## 2017-08-06 DIAGNOSIS — I517 Cardiomegaly: Secondary | ICD-10-CM

## 2017-08-06 HISTORY — DX: Left anterior fascicular block: I44.4

## 2017-08-06 HISTORY — DX: Cardiomegaly: I51.7

## 2017-08-06 HISTORY — DX: Bradycardia, unspecified: R00.1

## 2017-08-06 LAB — ECHOCARDIOGRAM COMPLETE
HEIGHTINCHES: 72 in
Weight: 3936 oz

## 2017-08-06 MED ORDER — RIVAROXABAN 20 MG PO TABS: 20.0000 mg | ORAL_TABLET | Freq: Every day | ORAL | 6 refills | Status: DC

## 2017-08-06 NOTE — Progress Notes (Signed)
Primary Care Physician: Mosie Lukes, MD Referring Physician: Dr. Myrtie Cruise is a 72 y.o. male with a h/o remote afib, first diagnosed in 2012, that has been quiet until it returned 7/20 and he presented to [facility] with RVR. He reports that he had  extra stress from starting a new job, was drinking extra caffeine and usually drinks a mixed drink of whiskey nightly. Was also eating a lot of chocolate. He has since stopped alcohol and decreased caffeine intake.He was successfully cardioverted in  the ER and is on xarelto 20 mg for CHA2DS2VASc score of 2.Pt is overweight but states he has lost 35+ lbs in the last year. He is sedentary. He had a sleep study ordered at one time, he did not pursue. Since he last weight, snoring is much less. Denies daytime somnolence.  Today, he denies symptoms of palpitations, chest pain, shortness of breath, orthopnea, PND, lower extremity edema, dizziness, presyncope, syncope, or neurologic sequela. The patient is tolerating medications without difficulties and is otherwise without complaint today.   Past Medical History:  Diagnosis Date  . Abnormal liver function 03/18/2013  . Anxiety   . Arthritis of both knees 03/26/2016  . Atrial fibrillation ([place])   . Back pain   . BCC (basal cell carcinoma of skin)    under right eye and right ear  . Cataract 09/29/2016  . Cataracts, bilateral 03/26/2016  . Diverticulitis   . Diverticulosis   . GERD (gastroesophageal reflux disease)   . Gout   . Hearing loss of both ears 03/26/2016  . Hyperglycemia 03/29/2016  . Hyperlipidemia   . Hypertension   . IBS (irritable bowel syndrome) 03/18/2013  . Internal hemorrhoids   . LAFB (left anterior fascicular block)   . Low back pain 03/26/2016  . Medicare annual wellness visit, subsequent 03/12/2014  . Obesity 11/25/2007   Qualifier: Diagnosis of  By: Lenna Gilford MD, Deborra Medina   . Other malaise and fatigue 03/13/2013  . Premature ventricular complex   . Preventative health  care 03/07/2015  . Rectal irritation 09/18/2012  . Tinea corporis 03/13/2013  . Unspecified constipation 09/25/2013   Past Surgical History:  Procedure Laterality Date  . COLON SURGERY  2009   segmental sigmoid resection  . COLONOSCOPY    . NOSE SURGERY    . SKIN BIOPSY      Current Outpatient Medications  Medication Sig Dispense Refill  . alum hydroxide-mag trisilicate (GAVISCON) 89-21 MG CHEW Chew by mouth.    Marland Kitchen atenolol (TENORMIN) 25 MG tablet TAKE ONE TABLET BY MOUTH TWICE A DAY 60 tablet 3  . cyclobenzaprine (FLEXERIL) 5 MG tablet Take 1 tablet (5 mg total) by mouth 3 (three) times daily as needed for muscle spasms. 30 tablet 1  . fluticasone (FLONASE) 50 MCG/ACT nasal spray Place 2 sprays into the nose daily as needed. For congestion    . irbesartan (AVAPRO) 150 MG tablet Take 1 tablet (150 mg total) by mouth daily. 90 tablet 1  . LORazepam (ATIVAN) 1 MG tablet TAKE ONE TABLET BY MOUTH EVERY 8 HOURS AS NEEDED FOR SLEEP OR ANXIETY 90 tablet 0  . mupirocin ointment (BACTROBAN) 2 % Apply to area twice daily. 22 g 0  . omeprazole (PRILOSEC) 20 MG capsule TAKE ONE CAPSULE BY MOUTH DAILY 30 capsule 5  . rivaroxaban (XARELTO) 20 MG TABS tablet Take 1 tablet (20 mg total) by mouth daily with supper. 30 tablet 6  . triamcinolone lotion (KENALOG) 0.1 % Apply  1 application topically 3 (three) times daily. 120 mL 0   No current facility-administered medications for this encounter.     Allergies  Allergen Reactions  . Cefaclor Hives  . Cephalosporins Hives  . Penicillins Rash    Mild maculopapular rash    Social History   Socioeconomic History  . Marital status: Married    Spouse name: Not on file  . Number of children: 3  . Years of education: Not on file  . Highest education level: Not on file  Occupational History  . Occupation: Nurse, adult: [employer]  . Financial resource strain: Not on file  . Food insecurity:    Worry: Not on file    Inability: Not on file    . Transportation needs:    Medical: Not on file    Non-medical: Not on file  Tobacco Use  . Smoking status: Former Smoker    Packs/day: 1.00    Years: 6.00    Pack years: 6.00    Types: Cigarettes    Start date: 01/12/1969  . Smokeless tobacco: Never Used  Substance and Sexual Activity  . Alcohol use: Yes    Comment: 2 drinks 5 nights a week  . Drug use: No  . Sexual activity: Yes  Lifestyle  . Physical activity:    Days per week: Not on file    Minutes per session: Not on file  . Stress: Not on file  Relationships  . Social connections:    Talks on phone: Not on file    Gets together: Not on file    Attends religious service: Not on file    Active member of club or organization: Not on file    Attends meetings of clubs or organizations: Not on file    Relationship status: Not on file  . Intimate partner violence:    Fear of current or ex partner: Not on file    Emotionally abused: Not on file    Physically abused: Not on file    Forced sexual activity: Not on file  Other Topics Concern  . Not on file  Social History Narrative   The patient is married for the second time, he has 3 sons.   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   2 alcoholic beverages most days.   1 caffeinated beverage daily   No drug use no current tobacco use he is a prior smoker   12/24/2016    Family History  Problem Relation Age of Onset  . Heart disease Mother   . Hypertension Mother   . Stroke Mother   . Colon cancer Mother   . Breast cancer Mother   . Heart disease Father        pacemaker  . Aortic aneurysm Father   . Hypertension Father   . Heart disease Sister   . Atrial fibrillation Sister   . Obesity Sister   . Sleep apnea Sister   . Heart attack Brother   . Other Brother        muscle disease  . Arthritis Brother   . Stroke Brother   . Atrial fibrillation Brother   . Cancer Maternal Grandmother        ?  Marland Kitchen Heart attack Maternal Grandmother   . Diabetes Maternal  Grandmother   . Cancer Maternal Grandfather        hodgin's lymphoma  . Heart attack Paternal Grandmother   . Anxiety disorder Paternal Grandmother   .  Pneumonia Paternal Grandfather   . Heart attack Brother   . Atrial fibrillation Brother   . Atrial fibrillation Brother   . Heart attack Brother   . Hypertension Brother   . Hyperlipidemia Brother   . Heart attack Brother   . Other Brother        heart valve operation  . Atrial fibrillation Brother     ROS- All systems are reviewed and negative except as per the HPI above  Physical Exam: Vitals:   08/06/17 0855  BP: 136/86  Pulse: (!) 59  Weight: 246 lb (111.6 kg)  Height: 6' (1.829 m)   Wt Readings from Last 3 Encounters:  08/06/17 246 lb (111.6 kg)  07/31/17 245 lb (111.1 kg)  05/17/17 250 lb 6.4 oz (113.6 kg)    Labs: Lab Results  Component Value Date   NA 138 07/31/2017   K 3.8 07/31/2017   CL 105 07/31/2017   CO2 23 07/31/2017   GLUCOSE 167 (H) 07/31/2017   BUN 18 07/31/2017   CREATININE 1.58 (H) 07/31/2017   CALCIUM 9.6 07/31/2017   PHOS 3.7 02/23/2014   MG 2.2 07/31/2017   Lab Results  Component Value Date   INR 1.39 11/14/2010   Lab Results  Component Value Date   CHOL 202 (H) 03/30/2017   HDL 35.00 (L) 03/30/2017   LDLCALC 146 (H) 03/30/2017   TRIG 105.0 03/30/2017     GEN- The patient is well appearing, alert and oriented x 3 today.   Head- normocephalic, atraumatic Eyes-  Sclera clear, conjunctiva pink Ears- hearing intact Oropharynx- clear Neck- supple, no JVP Lymph- no cervical lymphadenopathy Lungs- Clear to ausculation bilaterally, normal work of breathing Heart- Regular rate and rhythm, no murmurs, rubs or gallops, PMI not laterally displaced GI- soft, NT, ND, + BS Extremities- no clubbing, cyanosis, or edema MS- no significant deformity or atrophy Skin- no rash or lesion Psych- euthymic mood, full affect Neuro- strength and sensation are intact  EKG- Sinus brady at 59 bpm,  IRBBB, LAFB  Echo reviewed from 2012, showed mild LVH    Assessment and Plan: 1. Paroxysmal afib Quiet since 2012, return 7/20 Successful cardioversion in the ER Risk factors/triggers discussed Continue to stop/limit alcohol Continue with limiting caffeine Continue with weight loss efforts  Regular  exercise encouraged Update echo   2.Chadsvasc score of 2 Bleeding precautions discussed Continue xarelto 20 mg daily with Crcl cal at 66.79  F/u with Dr. Percival Spanish in the next 2-3 months  Geroge Baseman. [place], [facility] [address] [place], [place] [address] [phone]

## 2017-08-06 NOTE — Progress Notes (Signed)
  Echocardiogram 2D Echocardiogram has been performed.  David [name] 08/06/2017, 10:44 AM

## 2017-08-06 NOTE — Telephone Encounter (Signed)
Called the patient and LVM for the patient to call back and schedule a followup with Dr. Percival Spanish in 2 to 3 months.

## 2017-08-08 ENCOUNTER — Other Ambulatory Visit: Payer: Self-pay | Admitting: Family Medicine

## 2017-08-12 ENCOUNTER — Ambulatory Visit: Payer: Medicare Other | Admitting: Family Medicine

## 2017-08-12 ENCOUNTER — Encounter: Payer: Self-pay | Admitting: Family Medicine

## 2017-08-12 VITALS — BP 140/82 | HR 66 | Temp 97.7°F | Ht 72.0 in | Wt 245.2 lb

## 2017-08-12 DIAGNOSIS — R361 Hematospermia: Secondary | ICD-10-CM

## 2017-08-12 LAB — POC URINALSYSI DIPSTICK (AUTOMATED)
BILIRUBIN UA: NEGATIVE
GLUCOSE UA: NEGATIVE
KETONES UA: NEGATIVE
LEUKOCYTES UA: NEGATIVE
NITRITE UA: NEGATIVE
Protein, UA: NEGATIVE
Spec Grav, UA: >=1.03 — AB (ref 1.010–1.025)
Urobilinogen, UA: 0.2 E.U./dL
pH, UA: 5.5 (ref 5.0–8.0)

## 2017-08-12 LAB — URINALYSIS, ROUTINE W REFLEX MICROSCOPIC
BILIRUBIN URINE: NEGATIVE
KETONES UR: NEGATIVE
LEUKOCYTES UA: NEGATIVE
NITRITE: NEGATIVE
PH: 5.5 (ref 5.0–8.0)
Specific Gravity, Urine: 1.02 (ref 1.000–1.030)
Total Protein, Urine: NEGATIVE
UROBILINOGEN UA: 0.2 (ref 0.0–1.0)
Urine Glucose: NEGATIVE
WBC UA: NONE SEEN (ref 0–?)

## 2017-08-12 NOTE — Patient Instructions (Signed)
Hopefully this is just blood in sperm, which we car eless about.  If there is blood in the urine, we need to examine more closely. If there is blood today, we will have you return in 1 week to recheck your urine.  Let us know if you need anything.

## 2017-08-12 NOTE — Progress Notes (Signed)
Chief Complaint  Patient presents with  . Follow-up    blood in semen    Subjective: Patient is a 72 y.o. male here for blood in semen.  Pt noticed this AM that he had blood in semen. No inj. Did notice a little blood in his urine. No new sex partners or urinary complaints otherwise. He does have a hx of prostatitis. He has a hx of a fib and is currently on Xarelto. No other areas of easy bruising/bleeding.    ROS: GU: As noted in HPI Heme: As noted in HPI  Past Medical History:  Diagnosis Date  . Abnormal liver function 03/18/2013  . Anxiety   . Arthritis of both knees 03/26/2016  . Atrial fibrillation ([place])   . Back pain   . BCC (basal cell carcinoma of skin)    under right eye and right ear  . Cataract 09/29/2016  . Cataracts, bilateral 03/26/2016  . Diverticulitis   . Diverticulosis   . GERD (gastroesophageal reflux disease)   . Gout   . Hearing loss of both ears 03/26/2016  . Hyperglycemia 03/29/2016  . Hyperlipidemia   . Hypertension   . IBS (irritable bowel syndrome) 03/18/2013  . Internal hemorrhoids   . LAFB (left anterior fascicular block)   . Low back pain 03/26/2016  . Medicare annual wellness visit, subsequent 03/12/2014  . Obesity 11/25/2007   Qualifier: Diagnosis of  By: Lenna Gilford MD, Deborra Medina   . Other malaise and fatigue 03/13/2013  . Premature ventricular complex   . Preventative health care 03/07/2015  . Rectal irritation 09/18/2012  . Tinea corporis 03/13/2013  . Unspecified constipation 09/25/2013    Objective: BP 140/82 (BP Location: Left Arm, Patient Position: Sitting, Cuff Size: Large)   Pulse 66   Temp 97.7 F (36.5 C) (Oral)   Ht 6' (1.829 m)   Wt 245 lb 4 oz (111.2 kg)   SpO2 98%   BMI 33.26 kg/m  General: Awake, appears stated age HEENT: MMM, EOMi Heart: RRR, no murmurs Lungs: CTAB, no rales, wheezes or rhonchi. No accessory muscle use Abd: Mild distension, soft, NT, ND Rectal: No ext lesions, sphincter of good tone, prostate enlarged, not boggy or  ttp, no nodules noted Psych: Age appropriate judgment and insight, normal affect and mood  Assessment and Plan: Hematospermia - Plan: POCT Urinalysis Dipstick (Automated), Urinalysis, Routine w reflex microscopic  Orders as above. UA shows blood, will check microscopy. Will repeat in 1 week if positive. If so, will start hematuria work up. F/u pending above.  The patient voiced understanding and agreement to the plan.  [place], DO 08/12/17  2:17 PM

## 2017-08-12 NOTE — Progress Notes (Signed)
Pre visit review using our clinic review tool, if applicable. No additional management support is needed unless otherwise documented below in the visit note. 

## 2017-08-13 ENCOUNTER — Other Ambulatory Visit: Payer: Self-pay | Admitting: Family Medicine

## 2017-08-13 DIAGNOSIS — R319 Hematuria, unspecified: Secondary | ICD-10-CM

## 2017-08-17 ENCOUNTER — Other Ambulatory Visit (INDEPENDENT_AMBULATORY_CARE_PROVIDER_SITE_OTHER): Payer: Medicare Other

## 2017-08-17 ENCOUNTER — Other Ambulatory Visit: Payer: Self-pay | Admitting: Family Medicine

## 2017-08-17 DIAGNOSIS — R319 Hematuria, unspecified: Secondary | ICD-10-CM | POA: Diagnosis not present

## 2017-08-17 LAB — URINALYSIS, ROUTINE W REFLEX MICROSCOPIC
Bilirubin Urine: NEGATIVE
Ketones, ur: NEGATIVE
LEUKOCYTES UA: NEGATIVE
Nitrite: NEGATIVE
PH: 5.5 (ref 5.0–8.0)
SPECIFIC GRAVITY, URINE: 1.02 (ref 1.000–1.030)
TOTAL PROTEIN, URINE-UPE24: NEGATIVE
UROBILINOGEN UA: 0.2 (ref 0.0–1.0)
Urine Glucose: NEGATIVE

## 2017-08-18 ENCOUNTER — Encounter: Payer: Medicare Other | Admitting: Internal Medicine

## 2017-08-18 ENCOUNTER — Ambulatory Visit (HOSPITAL_BASED_OUTPATIENT_CLINIC_OR_DEPARTMENT_OTHER)
Admission: RE | Admit: 2017-08-18 | Discharge: 2017-08-18 | Disposition: A | Discharge status: Home / Self Care | Payer: Medicare Other | Source: Ambulatory Visit | Attending: Family Medicine | Admitting: Family Medicine

## 2017-08-18 DIAGNOSIS — N201 Calculus of ureter: Secondary | ICD-10-CM | POA: Insufficient documentation

## 2017-08-18 DIAGNOSIS — N200 Calculus of kidney: Secondary | ICD-10-CM | POA: Diagnosis not present

## 2017-08-18 DIAGNOSIS — R319 Hematuria, unspecified: Secondary | ICD-10-CM | POA: Diagnosis present

## 2017-08-18 MED ORDER — IOPAMIDOL (ISOVUE-370) INJECTION 76%
100.0000 mL | Freq: Once | INTRAVENOUS | Status: AC | PRN
  Administered 2017-08-18: 80 mL via INTRAVENOUS

## 2017-08-22 ENCOUNTER — Other Ambulatory Visit: Payer: Self-pay | Admitting: Family Medicine

## 2017-08-26 ENCOUNTER — Other Ambulatory Visit: Payer: Self-pay | Admitting: Family Medicine

## 2017-08-26 DIAGNOSIS — N289 Disorder of kidney and ureter, unspecified: Secondary | ICD-10-CM

## 2017-08-26 DIAGNOSIS — E559 Vitamin D deficiency, unspecified: Secondary | ICD-10-CM

## 2017-08-26 DIAGNOSIS — I4821 Permanent atrial fibrillation: Secondary | ICD-10-CM

## 2017-08-26 DIAGNOSIS — K760 Fatty (change of) liver, not elsewhere classified: Secondary | ICD-10-CM

## 2017-08-26 DIAGNOSIS — E782 Mixed hyperlipidemia: Secondary | ICD-10-CM

## 2017-08-27 NOTE — Telephone Encounter (Signed)
Requesting:ativan  Contract:yes UDS:low risk next screen 08/30/17 Last OV: 08/12/17 Next OV:09/28/17 Last Refill:07/06/17 [id number] Database:   Please advise

## 2017-09-01 ENCOUNTER — Other Ambulatory Visit (HOSPITAL_COMMUNITY): Payer: Self-pay

## 2017-09-01 MED ORDER — RIVAROXABAN 20 MG PO TABS: 20.0000 mg | ORAL_TABLET | Freq: Every day | ORAL | 3 refills | Status: DC

## 2017-09-06 ENCOUNTER — Other Ambulatory Visit: Payer: Self-pay | Admitting: Family Medicine

## 2017-09-07 ENCOUNTER — Ambulatory Visit: Payer: Medicare Other | Admitting: Medical

## 2017-09-07 ENCOUNTER — Ambulatory Visit (HOSPITAL_BASED_OUTPATIENT_CLINIC_OR_DEPARTMENT_OTHER)
Admission: RE | Admit: 2017-09-07 | Discharge: 2017-09-07 | Disposition: A | Discharge status: Home / Self Care | Payer: Medicare Other | Source: Ambulatory Visit | Attending: Medical | Admitting: Medical

## 2017-09-07 ENCOUNTER — Encounter: Payer: Self-pay | Admitting: Medical

## 2017-09-07 ENCOUNTER — Telehealth: Payer: Self-pay

## 2017-09-07 ENCOUNTER — Telehealth: Payer: Self-pay | Admitting: Family Medicine

## 2017-09-07 VITALS — BP 103/64 | HR 70 | Temp 98.3°F | Resp 16 | Ht 72.0 in | Wt 246.6 lb

## 2017-09-07 DIAGNOSIS — M109 Gout, unspecified: Secondary | ICD-10-CM | POA: Diagnosis not present

## 2017-09-07 DIAGNOSIS — M7989 Other specified soft tissue disorders: Secondary | ICD-10-CM | POA: Diagnosis not present

## 2017-09-07 DIAGNOSIS — M25572 Pain in left ankle and joints of left foot: Secondary | ICD-10-CM | POA: Diagnosis not present

## 2017-09-07 LAB — CBC WITH DIFFERENTIAL/PLATELET
BASOS ABS: 0 10*3/uL (ref 0.0–0.1)
BASOS PCT: 0.5 % (ref 0.0–3.0)
Eosinophils Absolute: 0.2 10*3/uL (ref 0.0–0.7)
Eosinophils Relative: 3 % (ref 0.0–5.0)
HCT: 42.8 % (ref 39.0–52.0)
Hemoglobin: 14.5 g/dL (ref 13.0–17.0)
LYMPHS ABS: 1.6 10*3/uL (ref 0.7–4.0)
Lymphocytes Relative: 22.9 % (ref 12.0–46.0)
MCHC: 34 g/dL (ref 30.0–36.0)
MCV: 93 fl (ref 78.0–100.0)
Monocytes Absolute: 0.6 10*3/uL (ref 0.1–1.0)
Monocytes Relative: 9.1 % (ref 3.0–12.0)
NEUTROS ABS: 4.5 10*3/uL (ref 1.4–7.7)
NEUTROS PCT: 64.5 % (ref 43.0–77.0)
PLATELETS: 208 10*3/uL (ref 150.0–400.0)
RBC: 4.6 Mil/uL (ref 4.22–5.81)
RDW: 13.7 % (ref 11.5–15.5)
WBC: 7 10*3/uL (ref 4.0–10.5)

## 2017-09-07 LAB — URIC ACID: Uric Acid, Serum: 8.1 mg/dL — ABNORMAL HIGH (ref 4.0–7.8)

## 2017-09-07 MED ORDER — COLCHICINE 0.6 MG PO CAPS: ORAL_CAPSULE | ORAL | 0 refills | Status: DC

## 2017-09-07 NOTE — Telephone Encounter (Signed)
Shanon Brow, pharmacist from [facility] called to relay that colchicine needs to be provided via brand name "colcris" per pt's insurance. Shanon Brow stated we do not need to amend the order as it stands now. Shanon Brow verified dosage the same. Routed to Dr. Charlett Blake as Juluis Rainier.

## 2017-09-07 NOTE — Patient Instructions (Signed)
You do appear to have gout flare. Will refill your colchicine presciption.   Please get uric acid, cbc and xray of ankle.  If pain worsens or persist please let me know.  Will update you results of labs when in.  Follow up as needed.

## 2017-09-07 NOTE — Progress Notes (Signed)
Subjective:    Patient ID: David [name], male    DOB: 10/31/1945, 72 y.o.   MRN: [id number]  HPI   Pt states yesterday at lunch left lateral ankle started to swell  Yesterday(rapidly worsened). He took took 3 tab of left over colchicine yesterday but had no left over tablets left over from old rx. Pt reports no recent alcohol use. Pt has eaten some red meat.   No fever, no chills or sweats.  He states 3 colchicine did help a lot. If he had more he would not be in for evaluation.     Review of Systems  Constitutional: Negative for chills, fatigue and fever.  Respiratory: Negative for cough, chest tightness, shortness of breath and wheezing.   Cardiovascular: Negative for chest pain and palpitations.  Musculoskeletal:       Ankle pain  Skin: Negative for rash.       Redness to lateral aspect of ankle.  Hematological: Negative for adenopathy. Does not bruise/bleed easily.  Psychiatric/Behavioral: Negative for behavioral problems and confusion. The patient is not nervous/anxious.     Past Medical History:  Diagnosis Date  . Abnormal liver function 03/18/2013  . Anxiety   . Arthritis of both knees 03/26/2016  . Atrial fibrillation ([place])   . Back pain   . BCC (basal cell carcinoma of skin)    under right eye and right ear  . Cataract 09/29/2016  . Cataracts, bilateral 03/26/2016  . Diverticulitis   . Diverticulosis   . GERD (gastroesophageal reflux disease)   . Gout   . Hearing loss of both ears 03/26/2016  . Hyperglycemia 03/29/2016  . Hyperlipidemia   . Hypertension   . IBS (irritable bowel syndrome) 03/18/2013  . Internal hemorrhoids   . LAFB (left anterior fascicular block)   . Low back pain 03/26/2016  . Medicare annual wellness visit, subsequent 03/12/2014  . Obesity 11/25/2007   Qualifier: Diagnosis of  By: Lenna Gilford MD, Deborra Medina   . Other malaise and fatigue 03/13/2013  . Premature ventricular complex   . Preventative health care 03/07/2015  . Rectal irritation 09/18/2012    . Tinea corporis 03/13/2013  . Unspecified constipation 09/25/2013     Social History   Socioeconomic History  . Marital status: Married    Spouse name: Not on file  . Number of children: 3  . Years of education: Not on file  . Highest education level: Not on file  Occupational History  . Occupation: Nurse, adult: [employer]  . Financial resource strain: Not on file  . Food insecurity:    Worry: Not on file    Inability: Not on file  . Transportation needs:    Medical: Not on file    Non-medical: Not on file  Tobacco Use  . Smoking status: Former Smoker    Packs/day: 1.00    Years: 6.00    Pack years: 6.00    Types: Cigarettes    Start date: 01/12/1969  . Smokeless tobacco: Never Used  Substance and Sexual Activity  . Alcohol use: Yes    Comment: 2 drinks 5 nights a week  . Drug use: No  . Sexual activity: Yes  Lifestyle  . Physical activity:    Days per week: Not on file    Minutes per session: Not on file  . Stress: Not on file  Relationships  . Social connections:    Talks on phone: Not on file    Gets  together: Not on file    Attends religious service: Not on file    Active member of club or organization: Not on file    Attends meetings of clubs or organizations: Not on file    Relationship status: Not on file  . Intimate partner violence:    Fear of current or ex partner: Not on file    Emotionally abused: Not on file    Physically abused: Not on file    Forced sexual activity: Not on file  Other Topics Concern  . Not on file  Social History Narrative   The patient is married for the second time, he has 3 sons.   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   2 alcoholic beverages most days.   1 caffeinated beverage daily   No drug use no current tobacco use he is a prior smoker   12/24/2016    Past Surgical History:  Procedure Laterality Date  . COLON SURGERY  2009   segmental sigmoid resection  . COLONOSCOPY    . NOSE SURGERY    .  SKIN BIOPSY      Family History  Problem Relation Age of Onset  . Heart disease Mother   . Hypertension Mother   . Stroke Mother   . Colon cancer Mother   . Breast cancer Mother   . Heart disease Father        pacemaker  . Aortic aneurysm Father   . Hypertension Father   . Heart disease Sister   . Atrial fibrillation Sister   . Obesity Sister   . Sleep apnea Sister   . Heart attack Brother   . Other Brother        muscle disease  . Arthritis Brother   . Stroke Brother   . Atrial fibrillation Brother   . Cancer Maternal Grandmother        ?  Marland Kitchen Heart attack Maternal Grandmother   . Diabetes Maternal Grandmother   . Cancer Maternal Grandfather        hodgin's lymphoma  . Heart attack Paternal Grandmother   . Anxiety disorder Paternal Grandmother   . Pneumonia Paternal Grandfather   . Heart attack Brother   . Atrial fibrillation Brother   . Atrial fibrillation Brother   . Heart attack Brother   . Hypertension Brother   . Hyperlipidemia Brother   . Heart attack Brother   . Other Brother        heart valve operation  . Atrial fibrillation Brother     Allergies  Allergen Reactions  . Cefaclor Hives  . Cephalosporins Hives  . Penicillins Rash    Mild maculopapular rash    Current Outpatient Medications on File Prior to Visit  Medication Sig Dispense Refill  . alum hydroxide-mag trisilicate (GAVISCON) 27-78 MG CHEW Chew by mouth.    Marland Kitchen atenolol (TENORMIN) 25 MG tablet Take 1 tablet (25 mg total) by mouth 2 (two) times daily. 180 tablet 0  . cyclobenzaprine (FLEXERIL) 5 MG tablet Take 1 tablet (5 mg total) by mouth 3 (three) times daily as needed for muscle spasms. 30 tablet 1  . fluticasone (FLONASE) 50 MCG/ACT nasal spray Place 2 sprays into the nose daily as needed. For congestion    . irbesartan (AVAPRO) 150 MG tablet Take 1 tablet (150 mg total) by mouth daily. 90 tablet 1  . LORazepam (ATIVAN) 1 MG tablet TAKE ONE TABLET BY MOUTH EVERY 8 HOURS AS NEEDED FOR  SLEEP OR ANXIETY 90  tablet 0  . mupirocin ointment (BACTROBAN) 2 % Apply to area twice daily. 22 g 0  . omeprazole (PRILOSEC) 20 MG capsule TAKE ONE CAPSULE BY MOUTH DAILY 30 capsule 4  . rivaroxaban (XARELTO) 20 MG TABS tablet Take 1 tablet (20 mg total) by mouth daily with supper. 30 tablet 3  . triamcinolone lotion (KENALOG) 0.1 % Apply 1 application topically 3 (three) times daily. 120 mL 0   No current facility-administered medications on file prior to visit.     BP 103/64   Pulse 70   Temp 98.3 F (36.8 C) (Oral)   Resp 16   Ht 6' (1.829 m)   Wt 246 lb 9.6 oz (111.9 kg)   SpO2 97%   BMI 33.44 kg/m       Objective:   Physical Exam  General- No acute distress. Pleasant patient. Neck- Full range of motion, no jvd Lungs- Clear, even and unlabored. Heart- regular rate and rhythm. Neurologic- CNII- XII grossly intact.  Left ankle- swollen lateral aspect over malleolus. Mild red, warm and tender to light touch.       Assessment & Plan:  You do appear to have gout flare. Will refill your colchicine presciption.   Please get uric acid, cbc and xray of ankle.  If pain worsens or persist please let me know.  Will update you results of labs when in.  Follow up as needed.  This appears more gout like and not infection. Will see how he responds to treatment and follow lab uric acid and cbc.

## 2017-09-07 NOTE — Telephone Encounter (Signed)
Rx refill request: colchicine 0.6 mg                  Has appointment today  Patient reports gout flare- will discuss at appointment.  LOV: 04/13/17- stopped medication  PCP: [place]: Kristopher Oppenheim

## 2017-09-07 NOTE — Telephone Encounter (Signed)
Copied from [place]. Topic: Quick Communication - See Telephone Encounter >> Sep 07, 2017  9:33 AM Conception Chancy, NT wrote: CRM for notification. See Telephone encounter for: 09/07/17.  Patient is calling and states he is having a gout flare up and is needing a refill on colchicine (COLCRYS) 0.6 MG tablet. Please advise.  Pt decided to make appointment for today 09/07/17 to get medication.   Kristopher Oppenheim at [place], [place] [address] [place] [address] Phone: [phone] Fax: [phone]

## 2017-09-14 ENCOUNTER — Telehealth: Payer: Self-pay | Admitting: Cardiology

## 2017-09-14 NOTE — Telephone Encounter (Signed)
   Primary Cardiologist: Minus Breeding, MD  Chart reviewed as part of pre-operative protocol coverage. Patient was contacted 09/14/2017 in reference to pre-operative risk assessment for pending surgery as outlined below.  David [name] was last seen on 08/06/17 by David Palau, NP.  Since that day, David [name] has done well. Getting > 4 mets of activity per DASI.   Therefore, based on ACC/AHA guidelines, the patient would be at acceptable risk for the planned procedure without further cardiovascular testing.   Will route pharmacy to review anticoagulation.   [place], [place] 09/14/2017, 4:15 PM

## 2017-09-14 NOTE — Telephone Encounter (Signed)
New Message           [facility] Pre-operative Risk Assessment    Request for surgical clearance:  1. What type of surgery is being performed? Ureteroscopy with laser lithotripsy  2. When is this surgery scheduled? Pending clearance  3. What type of clearance is required (medical clearance vs. Pharmacy clearance to hold med vs. Both)? Both   4. Are there any medications that need to be held prior to surgery and how long? Xarelto stop 2 days prior to surgery  5. Practice name and name of physician performing surgery? [facility]   6. What is your office phone number [phone]   7.   What is your office fax number [phone]  8.   Anesthesia type (None, local, MAC, general) ? General  Spoke to Mrs. Mickie Bail F Skeen 09/14/2017, 10:06 AM  _________________________________________________________________   (provider comments below)

## 2017-09-15 ENCOUNTER — Other Ambulatory Visit: Payer: Self-pay | Admitting: Urology

## 2017-09-15 NOTE — Telephone Encounter (Signed)
Patient with diagnosis of atrial fibrillation on Xarelto for anticoagulation.    Procedure: ureteroscopy Date of procedure: TBD  CHADS2-VASc score of  2 (, HTN, AGE, )  CrCl 66.9 Platelet count 208  Per office protocol, patient can hold Xarelto for 2 days prior to procedure.    Patient should restart Xarelto on the evening of procedure or day after, at discretion of procedure MD

## 2017-09-15 NOTE — Telephone Encounter (Signed)
I will route this recommendation to the requesting party via Epic fax function and remove from pre-op pool.  Please call with questions.  [place], [place] 09/15/2017, 1:24 PM

## 2017-09-16 ENCOUNTER — Encounter (HOSPITAL_BASED_OUTPATIENT_CLINIC_OR_DEPARTMENT_OTHER): Payer: Self-pay

## 2017-09-17 ENCOUNTER — Encounter: Payer: Self-pay | Admitting: Family Medicine

## 2017-09-17 ENCOUNTER — Ambulatory Visit: Payer: Medicare Other | Admitting: Family Medicine

## 2017-09-17 ENCOUNTER — Other Ambulatory Visit: Payer: Self-pay

## 2017-09-17 ENCOUNTER — Encounter (HOSPITAL_BASED_OUTPATIENT_CLINIC_OR_DEPARTMENT_OTHER): Payer: Self-pay

## 2017-09-17 DIAGNOSIS — I10 Essential (primary) hypertension: Secondary | ICD-10-CM | POA: Diagnosis not present

## 2017-09-17 DIAGNOSIS — E559 Vitamin D deficiency, unspecified: Secondary | ICD-10-CM

## 2017-09-17 DIAGNOSIS — G4700 Insomnia, unspecified: Secondary | ICD-10-CM | POA: Diagnosis not present

## 2017-09-17 DIAGNOSIS — F418 Other specified anxiety disorders: Secondary | ICD-10-CM

## 2017-09-17 DIAGNOSIS — I480 Paroxysmal atrial fibrillation: Secondary | ICD-10-CM

## 2017-09-17 DIAGNOSIS — R739 Hyperglycemia, unspecified: Secondary | ICD-10-CM

## 2017-09-17 DIAGNOSIS — N200 Calculus of kidney: Secondary | ICD-10-CM

## 2017-09-17 MED ORDER — SERTRALINE HCL 50 MG PO TABS: 50.0000 mg | ORAL_TABLET | Freq: Every day | ORAL | 3 refills | Status: DC

## 2017-09-17 NOTE — Patient Instructions (Addendum)
Melatonin 2-10 mg at bed time as needed  Insomnia Insomnia is a sleep disorder that makes it difficult to fall asleep or to stay asleep. Insomnia can cause tiredness (fatigue), low energy, difficulty concentrating, mood swings, and poor performance at work or school. There are three different ways to classify insomnia:  Difficulty falling asleep.  Difficulty staying asleep.  Waking up too early in the morning.  Any type of insomnia can be long-term (chronic) or short-term (acute). Both are common. Short-term insomnia usually lasts for three months or less. Chronic insomnia occurs at least three times a week for longer than three months. What are the causes? Insomnia may be caused by another condition, situation, or substance, such as:  Anxiety.  Certain medicines.  Gastroesophageal reflux disease (GERD) or other gastrointestinal conditions.  Asthma or other breathing conditions.  Restless legs syndrome, sleep apnea, or other sleep disorders.  Chronic pain.  Menopause. This may include hot flashes.  Stroke.  Abuse of alcohol, tobacco, or illegal drugs.  Depression.  Caffeine.  Neurological disorders, such as Alzheimer disease.  An overactive thyroid (hyperthyroidism).  The cause of insomnia may not be known. What increases the risk? Risk factors for insomnia include:  Gender. Women are more commonly affected than men.  Age. Insomnia is more common as you get older.  Stress. This may involve your professional or personal life.  Income. Insomnia is more common in people with lower income.  Lack of exercise.  Irregular work schedule or night shifts.  Traveling between different time zones.  What are the signs or symptoms? If you have insomnia, trouble falling asleep or trouble staying asleep is the main symptom. This may lead to other symptoms, such as:  Feeling fatigued.  Feeling nervous about going to sleep.  Not feeling rested in the  morning.  Having trouble concentrating.  Feeling irritable, anxious, or depressed.  How is this treated? Treatment for insomnia depends on the cause. If your insomnia is caused by an underlying condition, treatment will focus on addressing the condition. Treatment may also include:  Medicines to help you sleep.  Counseling or therapy.  Lifestyle adjustments.  Follow these instructions at home:  Take medicines only as directed by your health care provider.  Keep regular sleeping and waking hours. Avoid naps.  Keep a sleep diary to help you and your health care provider figure out what could be causing your insomnia. Include: ? When you sleep. ? When you wake up during the night. ? How well you sleep. ? How rested you feel the next day. ? Any side effects of medicines you are taking. ? What you eat and drink.  Make your bedroom a comfortable place where it is easy to fall asleep: ? Put up shades or special blackout curtains to block light from outside. ? Use a white noise machine to block noise. ? Keep the temperature cool.  Exercise regularly as directed by your health care provider. Avoid exercising right before bedtime.  Use relaxation techniques to manage stress. Ask your health care provider to suggest some techniques that may work well for you. These may include: ? Breathing exercises. ? Routines to release muscle tension. ? Visualizing peaceful scenes.  Cut back on alcohol, caffeinated beverages, and cigarettes, especially close to bedtime. These can disrupt your sleep.  Do not overeat or eat spicy foods right before bedtime. This can lead to digestive discomfort that can make it hard for you to sleep.  Limit screen use before bedtime. This includes: ?  Watching TV. ? Using your smartphone, tablet, and computer.  Stick to a routine. This can help you fall asleep faster. Try to do a quiet activity, brush your teeth, and go to bed at the same time each night.  Get  out of bed if you are still awake after 15 minutes of trying to sleep. Keep the lights down, but try reading or doing a quiet activity. When you feel sleepy, go back to bed.  Make sure that you drive carefully. Avoid driving if you feel very sleepy.  Keep all follow-up appointments as directed by your health care provider. This is important. Contact a health care provider if:  You are tired throughout the day or have trouble in your daily routine due to sleepiness.  You continue to have sleep problems or your sleep problems get worse. Get help right away if:  You have serious thoughts about hurting yourself or someone else. This information is not intended to replace advice given to you by your health care provider. Make sure you discuss any questions you have with your health care provider. Document Released: 12/27/1999 Document Revised: 05/31/2015 Document Reviewed: 09/29/2013 Elsevier Interactive Patient Education  [employer].

## 2017-09-17 NOTE — Assessment & Plan Note (Signed)
Well controlled, no changes to meds. Encouraged heart healthy diet such as the DASH diet and exercise as tolerated.  °

## 2017-09-17 NOTE — Progress Notes (Signed)
Spoke with:  Josph Macho NPO:  After Midnight, no gum, candy, or mints   Arrival time:  8004YP Labs:  KUB, Istat 4, (EKG 07/2017 in chart/epic) AM medications:  Atenolol, Lorazepam, Omeprazole, Flonase if needed Pre op orders: Yes Ride home:  Erlene Quan (son) [phone]

## 2017-09-17 NOTE — Assessment & Plan Note (Signed)
Encouraged good sleep hygiene such as dark, quiet room. No blue/green glowing lights such as computer screens in bedroom. No alcohol or stimulants in evening. Cut down on caffeine as able. Regular exercise is helpful but not just prior to bed time.  

## 2017-09-18 DIAGNOSIS — N200 Calculus of kidney: Secondary | ICD-10-CM | POA: Insufficient documentation

## 2017-09-18 NOTE — Assessment & Plan Note (Signed)
hgba1c acceptable, minimize simple carbs. Increase exercise as tolerated.  

## 2017-09-18 NOTE — Assessment & Plan Note (Signed)
Supplement and monitor 

## 2017-09-18 NOTE — Assessment & Plan Note (Signed)
RRR today 

## 2017-09-18 NOTE — Progress Notes (Signed)
Subjective:    Patient ID: David [name], male    DOB: 08-10-1945, 72 y.o.   MRN: [id number]  No chief complaint on file.   HPI Patient is in today to discuss  His recent diagnosis of kidney stones most notably an 8 mm stone in his right ureter. Recent hematospermia brought him in and hematuria was discovered. He then had an episode of abdominal pain which required a CT scan which showed an 8 mm stone in his right ureter. He is scheduled for a procedure to remove it with urology soon. No pain today. No visible hematuria or urinary hesitancy. No dysuria or flank pain. He is struggling with a great deal of stress and he is having trouble sleeping. Denies CP/palp/SOB/HA/congestion/fevers/GI c/o. Taking meds as prescribed  Past Medical History:  Diagnosis Date  . Abnormal liver function 03/18/2013  . Adenoma of right adrenal gland 07/11/2002   2.4cm , noted on CT ABD  . Anxiety   . Arthritis of both knees 03/26/2016  . Astigmatism   . Atrial fibrillation ([place])    x2 successful cardioversions  . Back pain   . BCC (basal cell carcinoma of skin)    under right eye and right ear  . Cataract 09/29/2016  . Cataracts, bilateral 03/26/2016  . Depression   . Diverticulitis   . Diverticulosis   . Fatty liver   . GERD (gastroesophageal reflux disease)   . Gout   . Hearing loss of both ears 03/26/2016  . History of chronic prostatitis    started at age 82  . History of kidney stones   . Hyperglycemia 03/29/2016  . Hyperlipidemia   . Hypertension   . IBS (irritable bowel syndrome) 03/18/2013  . Incomplete right bundle branch block (RBBB)   . Internal hemorrhoids   . Kidney lesion 06/07/2015   Right midportion, 1.1x1.1 cm hyperechoic, noted on [place] ABD  . LAFB (left anterior fascicular block)   . Lipoma of axilla 09/2016  . Liver lesion, right lobe 06/07/2015   2.5x2.4x2.4 cm hypoechoic lesion posterior aspect, noted on [place] ABD  . Low back pain 03/26/2016  . LVH (left ventricular hypertrophy)  08/06/2017   Moderate, noted on ECHO  . Medicare annual wellness visit, subsequent 03/12/2014  . Muscle cramps   . Nocturia   . OA (osteoarthritis)    Back, Hands, Neck  . Obesity 11/25/2007   Qualifier: Diagnosis of  By: Lenna Gilford MD, Deborra Medina   . Other malaise and fatigue 03/13/2013  . Premature ventricular complex   . Preventative health care 03/07/2015  . Rectal irritation 09/18/2012  . Renal insufficiency   . Scoliosis    Upper thoracic and lumbar  . Tinea corporis 03/13/2013  . Unspecified constipation 09/25/2013  . Vitamin D deficiency    resolved    Past Surgical History:  Procedure Laterality Date  . CARDIOVERSION  07/31/2017  . CATARACT EXTRACTION, BILATERAL    . COLON SURGERY  2009   segmental sigmoid resection  . COLONOSCOPY    . NOSE SURGERY     Submucous resection age 72  . NUCLEAR STRESS TEST  06/03/2009  . SKIN BIOPSY      Family History  Problem Relation Age of Onset  . Heart disease Mother   . Hypertension Mother   . Stroke Mother   . Colon cancer Mother   . Breast cancer Mother   . Heart disease Father        pacemaker  . Aortic aneurysm Father   .  Hypertension Father   . Heart disease Sister   . Atrial fibrillation Sister   . Obesity Sister   . Sleep apnea Sister   . Heart attack Brother   . Other Brother        muscle disease  . Arthritis Brother   . Stroke Brother   . Atrial fibrillation Brother   . Cancer Maternal Grandmother        ?  Marland Kitchen Heart attack Maternal Grandmother   . Diabetes Maternal Grandmother   . Cancer Maternal Grandfather        hodgin's lymphoma  . Heart attack Paternal Grandmother   . Anxiety disorder Paternal Grandmother   . Pneumonia Paternal Grandfather   . Heart attack Brother   . Atrial fibrillation Brother   . Atrial fibrillation Brother   . Heart attack Brother   . Hypertension Brother   . Hyperlipidemia Brother   . Heart attack Brother   . Other Brother        heart valve operation  . Atrial fibrillation  Brother     Social History   Socioeconomic History  . Marital status: Married    Spouse name: Not on file  . Number of children: 3  . Years of education: Not on file  . Highest education level: Not on file  Occupational History  . Occupation: Nurse, adult: [employer]  . Financial resource strain: Not on file  . Food insecurity:    Worry: Not on file    Inability: Not on file  . Transportation needs:    Medical: Not on file    Non-medical: Not on file  Tobacco Use  . Smoking status: Former Smoker    Packs/day: 1.00    Years: 6.00    Pack years: 6.00    Types: Cigarettes    Start date: 01/12/1969  . Smokeless tobacco: Never Used  . Tobacco comment: [id number]  Substance and Sexual Activity  . Alcohol use: Not Currently    Comment: 2 drinks 5 nights a week  . Drug use: No  . Sexual activity: Yes  Lifestyle  . Physical activity:    Days per week: Not on file    Minutes per session: Not on file  . Stress: Not on file  Relationships  . Social connections:    Talks on phone: Not on file    Gets together: Not on file    Attends religious service: Not on file    Active member of club or organization: Not on file    Attends meetings of clubs or organizations: Not on file    Relationship status: Not on file  . Intimate partner violence:    Fear of current or ex partner: Not on file    Emotionally abused: Not on file    Physically abused: Not on file    Forced sexual activity: Not on file  Other Topics Concern  . Not on file  Social History Narrative   The patient is married for the second time, he has 3 sons.   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   2 alcoholic beverages most days.   1 caffeinated beverage daily   No drug use no current tobacco use he is a prior smoker   12/24/2016    Outpatient Medications Prior to Visit  Medication Sig Dispense Refill  . alum hydroxide-mag trisilicate (GAVISCON) 87-68 MG CHEW Chew by mouth as needed.     Marland Kitchen  atenolol (TENORMIN)  25 MG tablet TAKE ONE TABLET BY MOUTH TWICE A DAY (Patient taking differently: 25 mg. ) 180 tablet 2  . Colchicine 0.6 MG CAPS 1 tab po bid (Patient taking differently: as needed. 1 tab po bid) 60 capsule 0  . cyclobenzaprine (FLEXERIL) 5 MG tablet Take 1 tablet (5 mg total) by mouth 3 (three) times daily as needed for muscle spasms. (Patient taking differently: Take 5 mg by mouth as needed for muscle spasms. ) 30 tablet 1  . fluticasone (FLONASE) 50 MCG/ACT nasal spray Place 2 sprays into the nose daily as needed. For congestion    . irbesartan (AVAPRO) 150 MG tablet Take 1 tablet (150 mg total) by mouth daily. 90 tablet 1  . LORazepam (ATIVAN) 1 MG tablet TAKE ONE TABLET BY MOUTH EVERY 8 HOURS AS NEEDED FOR SLEEP OR ANXIETY (Patient taking differently: 0.5 mg 2 (two) times daily. ) 90 tablet 0  . mupirocin ointment (BACTROBAN) 2 % Apply to area twice daily. (Patient taking differently: as needed. Apply to area twice daily.) 22 g 0  . omeprazole (PRILOSEC) 20 MG capsule TAKE ONE CAPSULE BY MOUTH DAILY (Patient taking differently: daily. ) 30 capsule 4  . rivaroxaban (XARELTO) 20 MG TABS tablet Take 1 tablet (20 mg total) by mouth daily with supper. 30 tablet 3  . triamcinolone lotion (KENALOG) 0.1 % Apply 1 application topically 3 (three) times daily. 120 mL 0   No facility-administered medications prior to visit.     Allergies  Allergen Reactions  . Cefaclor Hives  . Cephalosporins Hives  . Penicillins Rash    Mild maculopapular rash    Review of Systems  Constitutional: Negative for fever and malaise/fatigue.  HENT: Negative for congestion.   Eyes: Negative for blurred vision.  Respiratory: Negative for shortness of breath.   Cardiovascular: Negative for chest pain, palpitations and leg swelling.  Gastrointestinal: Positive for abdominal pain. Negative for blood in stool and nausea.  Genitourinary: Negative for dysuria and frequency.  Musculoskeletal: Negative for  falls.  Skin: Negative for rash.  Neurological: Negative for dizziness, loss of consciousness and headaches.  Endo/Heme/Allergies: Negative for environmental allergies.  Psychiatric/Behavioral: Negative for depression. The patient has insomnia. The patient is not nervous/anxious.        Objective:    Physical Exam  Constitutional: He is oriented to person, place, and time. He appears well-developed and well-nourished. No distress.  HENT:  Head: Normocephalic and atraumatic.  Nose: Nose normal.  Eyes: Right eye exhibits no discharge. Left eye exhibits no discharge.  Neck: Normal range of motion. Neck supple.  Cardiovascular: Normal rate and regular rhythm.  No murmur heard. Pulmonary/Chest: Effort normal and breath sounds normal.  Abdominal: Soft. Bowel sounds are normal. There is no tenderness.  Musculoskeletal: He exhibits no edema.  Neurological: He is alert and oriented to person, place, and time.  Skin: Skin is warm and dry.  Psychiatric: He has a normal mood and affect.  Nursing note and vitals reviewed.   BP 140/72 (BP Location: Left Arm, Patient Position: Sitting, Cuff Size: Normal)   Pulse 60   Temp 98 F (36.7 C) (Oral)   Resp 18   Ht 6' (1.829 m)   Wt 243 lb 9.6 oz (110.5 kg)   SpO2 98%   BMI 33.04 kg/m  Wt Readings from Last 3 Encounters:  09/17/17 243 lb 9.6 oz (110.5 kg)  09/07/17 246 lb 9.6 oz (111.9 kg)  08/12/17 245 lb 4 oz (111.2 kg)  Lab Results  Component Value Date   WBC 7.0 09/07/2017   HGB 14.5 09/07/2017   HCT 42.8 09/07/2017   PLT 208.0 09/07/2017   GLUCOSE 167 (H) 07/31/2017   CHOL 202 (H) 03/30/2017   TRIG 105.0 03/30/2017   HDL 35.00 (L) 03/30/2017   LDLDIRECT 168.5 03/29/2006   LDLCALC 146 (H) 03/30/2017   ALT 23 07/31/2017   AST 28 07/31/2017   NA 138 07/31/2017   K 3.8 07/31/2017   CL 105 07/31/2017   CREATININE 1.58 (H) 07/31/2017   BUN 18 07/31/2017   CO2 23 07/31/2017   TSH 2.98 04/13/2017   PSA 3.16 03/30/2017    INR 1.39 11/14/2010   HGBA1C 5.6 03/30/2017    Lab Results  Component Value Date   TSH 2.98 04/13/2017   Lab Results  Component Value Date   WBC 7.0 09/07/2017   HGB 14.5 09/07/2017   HCT 42.8 09/07/2017   MCV 93.0 09/07/2017   PLT 208.0 09/07/2017   Lab Results  Component Value Date   NA 138 07/31/2017   K 3.8 07/31/2017   CO2 23 07/31/2017   GLUCOSE 167 (H) 07/31/2017   BUN 18 07/31/2017   CREATININE 1.58 (H) 07/31/2017   BILITOT 1.0 07/31/2017   ALKPHOS 72 07/31/2017   AST 28 07/31/2017   ALT 23 07/31/2017   PROT 7.9 07/31/2017   ALBUMIN 4.3 07/31/2017   CALCIUM 9.6 07/31/2017   ANIONGAP 10 07/31/2017   GFR 51.68 (L) 04/13/2017   Lab Results  Component Value Date   CHOL 202 (H) 03/30/2017   Lab Results  Component Value Date   HDL 35.00 (L) 03/30/2017   Lab Results  Component Value Date   LDLCALC 146 (H) 03/30/2017   Lab Results  Component Value Date   TRIG 105.0 03/30/2017   Lab Results  Component Value Date   CHOLHDL 6 03/30/2017   Lab Results  Component Value Date   HGBA1C 5.6 03/30/2017       Assessment & Plan:   Problem List Items Addressed This Visit    Essential hypertension, benign    Well controlled, no changes to meds. Encouraged heart healthy diet such as the DASH diet and exercise as tolerated.       Atrial fibrillation (HCC)    RRR today      Vitamin D deficiency    Supplement and monitor      Depression with anxiety    Sertraline 50 mg daily      Relevant Medications   sertraline (ZOLOFT) 50 MG tablet   Hyperglycemia    hgba1c acceptable, minimize simple carbs. Increase exercise as tolerated.       Insomnia    Encouraged good sleep hygiene such as dark, quiet room. No blue/green glowing lights such as computer screens in bedroom. No alcohol or stimulants in evening. Cut down on caffeine as able. Regular exercise is helpful but not just prior to bed time.       Kidney stone    [facility] hematospermia brought him in  and hematuria was discovered. He then had an episode of abdominal pain which required a CT scan which showed an 8 mm stone in his right ureter. He is scheduled for a procedure to remove it with urology soon but was struggling with whether to proceed. After discussion he has decided to proceed.          I am having David [name] "Fred" start on sertraline. I am also having him maintain  his fluticasone, alum hydroxide-mag trisilicate, mupirocin ointment, cyclobenzaprine, irbesartan, omeprazole, LORazepam, rivaroxaban, atenolol, and Colchicine.  Meds ordered this encounter  Medications  . sertraline (ZOLOFT) 50 MG tablet    Sig: Take 1 tablet (50 mg total) by mouth daily.    Dispense:  30 tablet    Refill:  3     Penni Homans, MD

## 2017-09-18 NOTE — Assessment & Plan Note (Signed)
Sertraline 50 mg daily

## 2017-09-18 NOTE — Assessment & Plan Note (Addendum)
Recent hematospermia brought him in and hematuria was discovered. He then had an episode of abdominal pain which required a CT scan which showed an 8 mm stone in his right ureter. He is scheduled for a procedure to remove it with urology soon but was struggling with whether to proceed. After discussion he has decided to proceed.

## 2017-09-23 ENCOUNTER — Ambulatory Visit: Payer: Medicare Other | Admitting: Family Medicine

## 2017-09-23 ENCOUNTER — Encounter: Payer: Self-pay | Admitting: Family Medicine

## 2017-09-23 VITALS — BP 118/72 | HR 64 | Temp 98.5°F | Ht 72.0 in | Wt 243.4 lb

## 2017-09-23 DIAGNOSIS — J029 Acute pharyngitis, unspecified: Secondary | ICD-10-CM | POA: Diagnosis not present

## 2017-09-23 LAB — POCT RAPID STREP A (OFFICE): RAPID STREP A SCREEN: NEGATIVE

## 2017-09-23 MED ORDER — METHYLPREDNISOLONE ACETATE 80 MG/ML IJ SUSP
80.0000 mg | Freq: Once | INTRAMUSCULAR | Status: AC
  Administered 2017-09-23: 80 mg via INTRAMUSCULAR

## 2017-09-23 NOTE — H&P (Signed)
HPI: David [name] is a 72 year-old male with a right ureteral calculus.  The patient's stone was on his right side. He first noticed the symptoms 08/12/2017. He is not currently having flank pain, back pain, groin pain, nausea, vomiting, fever or chills. He does have a burning sensation when he urinates. He has not caught a stone in his urine strainer since his symptoms began.   He has never had surgical treatment for calculi in the past. This condition would be considered of mild to moderate severity with no modifying factors or associated signs or symptoms other than as noted above.   08/25/17: On 08/12/17 he noticed hematospermia again. He said he noted some blood in his urine. He is on the Xarelto. His urinalysis on 08/17/17 had 3-6 RBC/hpf. A CT scan on 08/18/17 revealed an 8 mm stone in the right ureter at the level of the L5-S1 interspace with Hounsfield units of 1100. Bilateral, nonobstructing renal calculi were noted as well.   09/08/17: He returns for follow-up of his right ureteral stone. He has not had any flank pain or gross hematuria. He has not seen the stone pass. He said he has had some slight discomfort in the lower abdomen before he goes to bed occasionally but that resolved by the morning. He is on Xarelto for atrial fibrillation and has stopped previously.     ALLERGIES: Cefaclor CAPS Cephalosporins Penicillins    MEDICATIONS: Tamsulosin Hcl 0.4 mg capsule 1 capsule PO Q PM  Aspirin 325 mg tablet Oral  Atenolol 100 mg tablet Oral  Colchicine 0.6 MG Oral Tablet Oral  Flexeril 5 mg tablet Oral  Flonase 50 MCG/ACT Nasal Suspension Nasal  Lipitor 40 MG Oral Tablet Oral  Lorazepam 1 mg tablet Oral  Naproxen TABS Oral  Vitamin D TABS Oral     GU PSH: None     PSH Notes: Small Bowel Resection, Nose Surgery   NON-GU PSH: Nose Surgery (Unspecified) Small bowel resection - 2012    GU PMH: Hematospermia (Worsening), We discussed the benign nature of hematospermia. With a  reportedly normal DRE and a PSA that is normal at 3.16 as well as a negative CT scan further evaluation is not necessary at this time. - 08/25/2017, We discussed the benign nature of amounts permit. This has occurred in the past. He has no voiding symptoms other than some mild nocturia and slowing of his urinary stream. He has not had a PSA done so we will check that but I found no abnormality on his DRE. I did offer finasteride as an option off label but he did not want to consider this., - 2018, Hematospermia, - 2014 Renal calculus, Bilateral, He was found to have bilateral, nonobstructing renal calculi by CT scan. - 08/25/2017 Ureteral calculus (Acute), Right, After discussing the options for management of his ureteral calculus he has elected to proceed with medical expulsive therapy. I told him there was probably going to be a low probability of spontaneous passage although he is not obstructed and not having any pain so I will reimage him again in 2 weeks. If his stone persists he told me he would per for lithotripsy over ureteroscopy. - 08/25/2017 BPH w/LUTS, He does have some BPH by exam. - 2018 Encounter for Prostate Cancer screening, I will check a screening PSA today especially because he has had hemospermia. - 2018, Prostate cancer screening, - 2014 Nocturia, His nocturia and slowing of his urinary stream are not enough of a bother for him that  he would want to consider pharmacologic therapy at this time. - 2018 Adrenal mass Unspec, Adrenal cortical adenoma, unspecified laterality - 2014 BPH w/o LUTS, Benign prostatic hypertrophy without lower urinary tract symptoms - 2014      PMH Notes: Hematospermia/hematuria: In 12/12 the patient had reported an episode of hemospermia and then described the initiation of his urinary stream after that an ejaculation as containing blood. He does have a past history of prostatitis. He also has had atrial fibrillation and been on Pradaxa. He stopped the Peradaxa  and about a week later is when he saw what he describes as a single drop of blood in his semen. When he urinated the next time he saw a small amount of blood at the beginning of his urinary stream but no further blood in the urine. This pattern repeated itself a second time and has not recurred since. He has no discomfort with ejaculation.   NON-GU PMH: Anxiety, Anxiety - 2014 Personal history of other diseases of the circulatory system, History of hypertension - 2014 Personal history of other endocrine, nutritional and metabolic disease, History of hypercholesterolemia - 2014 Personal history of other mental and behavioral disorders, History of depression - 2014 Personal history of other specified conditions, History of heartburn - 2014    FAMILY HISTORY: Acute Myocardial Infarction - Brother, Brother Death In The Family Father - Sister Death In The Family Mother - Sister Family Health Status Number - Sister Heart Disease - Sister   SOCIAL HISTORY: Marital Status: Married Preferred Language: English; Ethnicity: Not Hispanic Or Latino; Race: White Current Smoking Status: Patient does not smoke anymore. Has not smoked since 01/12/2010.   Tobacco Use Assessment Completed: Used Tobacco in last 30 days? Does drink.  Drinks 2 caffeinated drinks per day.     Notes: Occupation:, Alcohol Use, Caffeine Use, Marital History - Currently Married, Tobacco Use   REVIEW OF SYSTEMS:    GU Review Male:   Patient denies frequent urination, hard to postpone urination, burning/ pain with urination, get up at night to urinate, leakage of urine, stream starts and stops, trouble starting your stream, have to strain to urinate , erection problems, and penile pain.  Gastrointestinal (Upper):   Patient denies nausea, vomiting, and indigestion/ heartburn.  Gastrointestinal (Lower):   Patient denies diarrhea and constipation.  Constitutional:   Patient denies fever, night sweats, weight loss, and fatigue.  Skin:    Patient denies skin rash/ lesion and itching.  Eyes:   Patient denies blurred vision and double vision.  Ears/ Nose/ Throat:   Patient denies sore throat and sinus problems.  Hematologic/Lymphatic:   Patient denies swollen glands and easy bruising.  Cardiovascular:   Patient denies leg swelling and chest pains.  Respiratory:   Patient denies cough and shortness of breath.  Endocrine:   Patient denies excessive thirst.  Musculoskeletal:   Patient denies back pain and joint pain.  Neurological:   Patient denies headaches and dizziness.  Psychologic:   Patient denies depression and anxiety.   VITAL SIGNS:    Weight 246 lb / 111.58 kg  Height 72 in / 182.88 cm  BP 119/71 mmHg  Pulse 68 /min  Temperature 98.2 F / 36.7 C  BMI 33.4 kg/m   GU PHYSICAL EXAMINATION:    Anus and Perineum: No hemorrhoids. No anal stenosis. No rectal fissure, no anal fissure. No edema, no dimple, no perineal tenderness, no anal tenderness.  Scrotum: No lesions. No edema. No cysts. No warts.  Epididymides: Right: no  spermatocele, no masses, no cysts, no tenderness, no induration, no enlargement. Left: no spermatocele, no masses, no cysts, no tenderness, no induration, no enlargement.  Testes: No tenderness, no swelling, no enlargement left testes. No tenderness, no swelling, no enlargement right testes. Normal location left testes. Normal location right testes. No mass, no cyst, no varicocele, no hydrocele left testes. No mass, no cyst, no varicocele, no hydrocele right testes.  Urethral Meatus: Normal size. No lesion, no wart, no discharge, no polyp. Normal location.  Penis: Circumcised, no warts, no cracks. No dorsal Peyronie's plaques, no left corporal Peyronie's plaques, no right corporal Peyronie's plaques, no scarring, no warts. No balanitis, no meatal stenosis.  Prostate: Prostate 2 1/2+ size. Left lobe normal consistency, right lobe normal consistency. Symmetrical lobes. No prostate nodule. Left lobe no  tenderness, right lobe no tenderness.   Seminal Vesicles: Nonpalpable.  Sphincter Tone: Normal sphincter. No rectal tenderness. No rectal mass.    MULTI-SYSTEM PHYSICAL EXAMINATION:    Constitutional: Well-nourished. No physical deformities. Normally developed. Good grooming.  Neck: Neck symmetrical, not swollen. Normal tracheal position.  Respiratory: No labored breathing, no use of accessory muscles.   Cardiovascular: Normal temperature, normal extremity pulses, no swelling, no varicosities.  Lymphatic: No enlargement of neck, axillae, groin.  Skin: No paleness, no jaundice, no cyanosis. No lesion, no ulcer, no rash.  Neurologic / Psychiatric: Oriented to time, oriented to place, oriented to person. No depression, no anxiety, no agitation.  Gastrointestinal: No mass, no tenderness, no rigidity, non obese abdomen.  Eyes: Normal conjunctivae. Normal eyelids.  Ears, Nose, Mouth, and Throat: Left ear no scars, no lesions, no masses. Right ear no scars, no lesions, no masses. Nose no scars, no lesions, no masses. Normal hearing. Normal lips.  Musculoskeletal: Normal gait and station of head and neck.    PAST DATA REVIEWED:  Source Of History:  Patient  Records Review:   Previous Patient Records, POC Tool  X-Ray Review: C.T. Stone Protocol: Reviewed Films. Previous CT scan images were reviewed and compared with today's KUB.    03/30/17 02/03/16 01/08/11  PSA  Total PSA 3.16 ng/dl 2.02 ng/dl 1.29     PROCEDURES:         KUB - 74018  A single view of the abdomen is obtained.      Review of his images reveals what appears to be calcification overlying the upper sacrum on the right-hand side. His right renal calculus is again noted.         Urinalysis Dipstick Dipstick Cont'd  Color: Yellow Bilirubin: Neg mg/dL  Appearance: Clear Ketones: Neg mg/dL  Specific Gravity: 1.025 Blood: Neg ery/uL  pH: <=5.0 Protein: Neg mg/dL  Glucose: Neg mg/dL Urobilinogen: 0.2 mg/dL    Nitrites: Neg     Leukocyte Esterase: Neg leu/uL    ASSESSMENT/PLAN:       ICD-10 Details  1 GU:   Ureteral calculus - N20.1 Right, Stable, Acute - Because his stone is very difficult to visualize on a plain film I do not feel he would be a good candidate for lithotripsy and therefore have discussed proceeding with ureteroscopy due to the fact that the stone has failed to progress. He will come off of his Xarelto 2 days prior to procedure.

## 2017-09-23 NOTE — Patient Instructions (Addendum)
Continue to push fluids, practice good hand hygiene, and cover your mouth if you cough.  If you start having fevers, shaking or shortness of breath, seek immediate care.  1-2 days to get the results of your culture back.  Continue salt water gargles, throat lozenges, air humidifier could be helpful.   Depomedrol 80 mg is what you received today.  Let us know if you need anything.

## 2017-09-23 NOTE — Progress Notes (Signed)
SUBJECTIVE:   David [name] is a 72 y.o. male presents to the clinic for:  Chief Complaint  Patient presents with  . Nasal Congestion  . Sore Throat    Complains of sore throat for 2 days.  Other associated symptoms: rhinorrhea, sore throat and cough.  Denies: sinus congestion, sinus pain, itchy watery eyes, ear pain, ear drainage, wheezing, shortness of breath, myalgia and fevers Sick Contacts: granddaughter with URI s/s's Therapy to date: salt water gargles, tea w honey and lemon  Social History   Tobacco Use  Smoking Status Former Smoker  . Packs/day: 1.00  . Years: 6.00  . Pack years: 6.00  . Types: Cigarettes  . Start date: 01/12/1969  Smokeless Tobacco Never Used  Tobacco Comment   1965-1971    ROS: Pertinent items are noted in HPI  Patient's medications, allergies, past medical, surgical, social and family histories were reviewed and updated as appropriate.  OBJECTIVE:  BP 118/72 (BP Location: Left Arm, Patient Position: Sitting, Cuff Size: Large)   Pulse 64   Temp 98.5 F (36.9 C) (Oral)   Ht 6' (1.829 m)   Wt 243 lb 6 oz (110.4 kg)   SpO2 95%   BMI 33.01 kg/m  General: Awake, alert, appearing stated age Eyes: conjunctivae and sclerae clear Ears: normal TMs bilaterally Nose: no visible exudate Oropharynx: lips, mucosa, and tongue normal; teeth and gums normal Neck: supple, no significant adenopathy Lungs: clear to auscultation, no wheezes, rales or rhonchi, symmetric air entry, normal effort Heart: rate and rhythm regular Skin:reveals no rash Psych: Age appropriate judgment and insight  ASSESSMENT/PLAN:  Sore throat - Plan: POCT rapid strep A, Culture, Group A Strep, methylPREDNISolone acetate (DEPO-MEDROL) injection 80 mg  Orders as above. Strep neg. Culture obtained.  Continue to practice good hand hygiene and push fluids. Acetaminophen for pain. Salt water gargles, throat lozenges, air humidifier. Depo today. F/u prn. Pt voiced understanding  and agreement to the plan.  Hubbell, DO 09/23/17 1:14 PM

## 2017-09-23 NOTE — Progress Notes (Signed)
Pre visit review using our clinic review tool, if applicable. No additional management support is needed unless otherwise documented below in the visit note. 

## 2017-09-24 ENCOUNTER — Encounter (HOSPITAL_BASED_OUTPATIENT_CLINIC_OR_DEPARTMENT_OTHER)
Admission: RE | Disposition: A | Discharge status: Home / Self Care | Payer: Self-pay | Source: Ambulatory Visit | Attending: Urology

## 2017-09-24 ENCOUNTER — Ambulatory Visit (HOSPITAL_BASED_OUTPATIENT_CLINIC_OR_DEPARTMENT_OTHER): Payer: Medicare Other | Admitting: Anesthesiology

## 2017-09-24 ENCOUNTER — Encounter (HOSPITAL_BASED_OUTPATIENT_CLINIC_OR_DEPARTMENT_OTHER): Payer: Self-pay | Admitting: *Deleted

## 2017-09-24 ENCOUNTER — Ambulatory Visit (HOSPITAL_BASED_OUTPATIENT_CLINIC_OR_DEPARTMENT_OTHER)
Admission: RE | Admit: 2017-09-24 | Discharge: 2017-09-24 | Disposition: A | Discharge status: Home / Self Care | Payer: Medicare Other | Source: Ambulatory Visit | Attending: Urology | Admitting: Urology

## 2017-09-24 ENCOUNTER — Ambulatory Visit (HOSPITAL_COMMUNITY): Payer: Medicare Other

## 2017-09-24 ENCOUNTER — Other Ambulatory Visit: Payer: Self-pay

## 2017-09-24 DIAGNOSIS — Z79899 Other long term (current) drug therapy: Secondary | ICD-10-CM | POA: Diagnosis not present

## 2017-09-24 DIAGNOSIS — N201 Calculus of ureter: Secondary | ICD-10-CM | POA: Insufficient documentation

## 2017-09-24 DIAGNOSIS — E7800 Pure hypercholesterolemia, unspecified: Secondary | ICD-10-CM | POA: Insufficient documentation

## 2017-09-24 DIAGNOSIS — I10 Essential (primary) hypertension: Secondary | ICD-10-CM | POA: Diagnosis not present

## 2017-09-24 DIAGNOSIS — Z87891 Personal history of nicotine dependence: Secondary | ICD-10-CM | POA: Insufficient documentation

## 2017-09-24 DIAGNOSIS — Z7901 Long term (current) use of anticoagulants: Secondary | ICD-10-CM | POA: Insufficient documentation

## 2017-09-24 DIAGNOSIS — Z791 Long term (current) use of non-steroidal anti-inflammatories (NSAID): Secondary | ICD-10-CM | POA: Diagnosis not present

## 2017-09-24 DIAGNOSIS — N400 Enlarged prostate without lower urinary tract symptoms: Secondary | ICD-10-CM | POA: Diagnosis not present

## 2017-09-24 DIAGNOSIS — Z7982 Long term (current) use of aspirin: Secondary | ICD-10-CM | POA: Insufficient documentation

## 2017-09-24 DIAGNOSIS — I4891 Unspecified atrial fibrillation: Secondary | ICD-10-CM | POA: Insufficient documentation

## 2017-09-24 DIAGNOSIS — Z7951 Long term (current) use of inhaled steroids: Secondary | ICD-10-CM | POA: Insufficient documentation

## 2017-09-24 HISTORY — DX: Cardiomegaly: I51.7

## 2017-09-24 HISTORY — DX: Depression, unspecified: F32.A

## 2017-09-24 HISTORY — DX: Unspecified right bundle-branch block: I45.10

## 2017-09-24 HISTORY — DX: Scoliosis, unspecified: M41.9

## 2017-09-24 HISTORY — DX: Unspecified osteoarthritis, unspecified site: M19.90

## 2017-09-24 HISTORY — DX: Unspecified astigmatism, unspecified eye: H52.209

## 2017-09-24 HISTORY — DX: Personal history of other diseases of male genital organs: Z87.438

## 2017-09-24 HISTORY — DX: Disorder of kidney and ureter, unspecified: N28.9

## 2017-09-24 HISTORY — DX: Personal history of urinary calculi: Z87.442

## 2017-09-24 HISTORY — DX: Cramp and spasm: R25.2

## 2017-09-24 HISTORY — PX: HOLMIUM LASER APPLICATION: SHX5852

## 2017-09-24 HISTORY — DX: Nocturia: R35.1

## 2017-09-24 HISTORY — DX: Vitamin D deficiency, unspecified: E55.9

## 2017-09-24 HISTORY — DX: Fatty (change of) liver, not elsewhere classified: K76.0

## 2017-09-24 HISTORY — DX: Benign neoplasm of right adrenal gland: D35.01

## 2017-09-24 HISTORY — DX: Benign lipomatous neoplasm of skin and subcutaneous tissue of unspecified limb: D17.20

## 2017-09-24 HISTORY — PX: URETEROSCOPY WITH HOLMIUM LASER LITHOTRIPSY: SHX6645

## 2017-09-24 HISTORY — DX: Liver disease, unspecified: K76.9

## 2017-09-24 HISTORY — DX: Major depressive disorder, single episode, unspecified: F32.9

## 2017-09-24 LAB — POCT I-STAT, CHEM 8
BUN: 17 mg/dL (ref 8–23)
Calcium, Ion: 1.37 mmol/L (ref 1.15–1.40)
Chloride: 105 mmol/L (ref 98–111)
Creatinine, Ser: 1.7 mg/dL — ABNORMAL HIGH (ref 0.61–1.24)
Glucose, Bld: 96 mg/dL (ref 70–99)
HCT: 42 % (ref 39.0–52.0)
Hemoglobin: 14.3 g/dL (ref 13.0–17.0)
Potassium: 4.1 mmol/L (ref 3.5–5.1)
Sodium: 140 mmol/L (ref 135–145)
TCO2: 25 mmol/L (ref 22–32)

## 2017-09-24 SURGERY — URETEROSCOPY, WITH LITHOTRIPSY USING HOLMIUM LASER
Anesthesia: General | Site: Renal | Laterality: Right

## 2017-09-24 MED ORDER — PROPOFOL 10 MG/ML IV BOLUS
INTRAVENOUS | Status: DC | PRN
  Administered 2017-09-24: 200 mg via INTRAVENOUS

## 2017-09-24 MED ORDER — CIPROFLOXACIN IN D5W 400 MG/200ML IV SOLN
400.0000 mg | Freq: Once | INTRAVENOUS | Status: AC
  Administered 2017-09-24: 400 mg via INTRAVENOUS
  Filled 2017-09-24: qty 200

## 2017-09-24 MED ORDER — BELLADONNA ALKALOIDS-OPIUM 16.2-30 MG RE SUPP
RECTAL | Status: AC
  Filled 2017-09-24: qty 1

## 2017-09-24 MED ORDER — LACTATED RINGERS IV SOLN
INTRAVENOUS | Status: DC
  Administered 2017-09-24: 1000 mL via INTRAVENOUS
  Administered 2017-09-24: 07:00:00 via INTRAVENOUS
  Filled 2017-09-24: qty 1000

## 2017-09-24 MED ORDER — FENTANYL CITRATE (PF) 100 MCG/2ML IJ SOLN
INTRAMUSCULAR | Status: AC
  Filled 2017-09-24: qty 2

## 2017-09-24 MED ORDER — DEXAMETHASONE SODIUM PHOSPHATE 10 MG/ML IJ SOLN
INTRAMUSCULAR | Status: AC
  Filled 2017-09-24: qty 1

## 2017-09-24 MED ORDER — ONDANSETRON HCL 4 MG/2ML IJ SOLN
INTRAMUSCULAR | Status: AC
  Filled 2017-09-24: qty 2

## 2017-09-24 MED ORDER — PHENAZOPYRIDINE HCL 200 MG PO TABS: 200.0000 mg | ORAL_TABLET | Freq: Three times a day (TID) | ORAL | 0 refills | Status: DC | PRN

## 2017-09-24 MED ORDER — FENTANYL CITRATE (PF) 100 MCG/2ML IJ SOLN
INTRAMUSCULAR | Status: DC | PRN
  Administered 2017-09-24: 25 ug via INTRAVENOUS
  Administered 2017-09-24: 50 ug via INTRAVENOUS
  Administered 2017-09-24: 25 ug via INTRAVENOUS

## 2017-09-24 MED ORDER — IOHEXOL 300 MG/ML  SOLN
INTRAMUSCULAR | Status: DC | PRN
  Administered 2017-09-24: 10 mL

## 2017-09-24 MED ORDER — ONDANSETRON HCL 4 MG/2ML IJ SOLN
4.0000 mg | Freq: Four times a day (QID) | INTRAMUSCULAR | Status: DC | PRN
  Filled 2017-09-24: qty 2

## 2017-09-24 MED ORDER — PROPOFOL 10 MG/ML IV BOLUS
INTRAVENOUS | Status: AC
  Filled 2017-09-24: qty 40

## 2017-09-24 MED ORDER — ACETAMINOPHEN 10 MG/ML IV SOLN
1000.0000 mg | Freq: Four times a day (QID) | INTRAVENOUS | Status: AC
  Administered 2017-09-24: 1000 mg via INTRAVENOUS
  Filled 2017-09-24: qty 100

## 2017-09-24 MED ORDER — DEXAMETHASONE SODIUM PHOSPHATE 10 MG/ML IJ SOLN
INTRAMUSCULAR | Status: DC | PRN
  Administered 2017-09-24: 10 mg via INTRAVENOUS

## 2017-09-24 MED ORDER — HYDROCODONE-ACETAMINOPHEN 10-325 MG PO TABS: 1.0000 | ORAL_TABLET | ORAL | 0 refills | Status: DC | PRN

## 2017-09-24 MED ORDER — BELLADONNA ALKALOIDS-OPIUM 16.2-60 MG RE SUPP
1.0000 | Freq: Every day | RECTAL | Status: DC
  Administered 2017-09-24: 1 via RECTAL
  Filled 2017-09-24: qty 1

## 2017-09-24 MED ORDER — FENTANYL CITRATE (PF) 100 MCG/2ML IJ SOLN
25.0000 ug | INTRAMUSCULAR | Status: DC | PRN
  Filled 2017-09-24: qty 1

## 2017-09-24 MED ORDER — ONDANSETRON HCL 4 MG/2ML IJ SOLN
INTRAMUSCULAR | Status: DC | PRN
  Administered 2017-09-24: 4 mg via INTRAVENOUS

## 2017-09-24 MED ORDER — ACETAMINOPHEN 10 MG/ML IV SOLN
INTRAVENOUS | Status: AC
  Filled 2017-09-24: qty 100

## 2017-09-24 MED ORDER — HYDROCODONE-ACETAMINOPHEN 10-325 MG PO TABS
ORAL_TABLET | ORAL | Status: AC
  Filled 2017-09-24: qty 1

## 2017-09-24 MED ORDER — HYDROCODONE-ACETAMINOPHEN 10-325 MG PO TABS
1.0000 | ORAL_TABLET | Freq: Once | ORAL | Status: AC
  Administered 2017-09-24: 1 via ORAL
  Filled 2017-09-24: qty 1

## 2017-09-24 MED ORDER — LIDOCAINE 2% (20 MG/ML) 5 ML SYRINGE
INTRAMUSCULAR | Status: DC | PRN
  Administered 2017-09-24: 100 mg via INTRAVENOUS

## 2017-09-24 MED ORDER — PHENAZOPYRIDINE HCL 200 MG PO TABS
200.0000 mg | ORAL_TABLET | Freq: Three times a day (TID) | ORAL | Status: AC
  Administered 2017-09-24: 200 mg via ORAL
  Filled 2017-09-24: qty 1

## 2017-09-24 MED ORDER — PHENAZOPYRIDINE HCL 100 MG PO TABS
ORAL_TABLET | ORAL | Status: AC
  Filled 2017-09-24: qty 2

## 2017-09-24 MED ORDER — TAMSULOSIN HCL 0.4 MG PO CAPS
ORAL_CAPSULE | ORAL | Status: AC
  Filled 2017-09-24: qty 1

## 2017-09-24 MED ORDER — SODIUM CHLORIDE 0.9 % IR SOLN
Status: DC | PRN
  Administered 2017-09-24: 3000 mL

## 2017-09-24 MED ORDER — CIPROFLOXACIN IN D5W 400 MG/200ML IV SOLN
INTRAVENOUS | Status: AC
  Filled 2017-09-24: qty 200

## 2017-09-24 MED ORDER — LIDOCAINE 2% (20 MG/ML) 5 ML SYRINGE
INTRAMUSCULAR | Status: AC
  Filled 2017-09-24: qty 5

## 2017-09-24 MED ORDER — OXYCODONE HCL 5 MG/5ML PO SOLN
5.0000 mg | Freq: Once | ORAL | Status: DC | PRN
  Filled 2017-09-24: qty 5

## 2017-09-24 MED ORDER — TAMSULOSIN HCL 0.4 MG PO CAPS
0.4000 mg | ORAL_CAPSULE | Freq: Every day | ORAL | Status: AC
  Administered 2017-09-24: 0.4 mg via ORAL
  Filled 2017-09-24: qty 1

## 2017-09-24 MED ORDER — OXYCODONE HCL 5 MG PO TABS
5.0000 mg | ORAL_TABLET | Freq: Once | ORAL | Status: DC | PRN
  Filled 2017-09-24: qty 1

## 2017-09-24 SURGICAL SUPPLY — 29 items
BAG DRAIN URO-CYSTO SKYTR STRL (DRAIN) ×2 IMPLANT
BASKET ZERO TIP NITINOL 2.4FR (BASKET) ×2 IMPLANT
CATH INTERMIT  6FR 70CM (CATHETERS) ×2 IMPLANT
CATH URET 5FR 28IN CONE TIP (BALLOONS)
CATH URET 5FR 70CM CONE TIP (BALLOONS) IMPLANT
CATH URET DUAL LUMEN 6-10FR 50 (CATHETERS) ×2 IMPLANT
CLOTH BEACON ORANGE TIMEOUT ST (SAFETY) ×2 IMPLANT
EXTRACTOR STONE 1.7FRX115CM (UROLOGICAL SUPPLIES) IMPLANT
FIBER LASER FLEXIVA 365 (UROLOGICAL SUPPLIES) IMPLANT
FIBER LASER TRAC TIP (UROLOGICAL SUPPLIES) ×2 IMPLANT
GLOVE BIO SURGEON STRL SZ 6.5 (GLOVE) ×2 IMPLANT
GLOVE BIO SURGEON STRL SZ8 (GLOVE) ×2 IMPLANT
GLOVE BIOGEL PI IND STRL 6.5 (GLOVE) ×1 IMPLANT
GLOVE BIOGEL PI INDICATOR 6.5 (GLOVE) ×1
GOWN STRL REUS W/TWL XL LVL3 (GOWN DISPOSABLE) ×2 IMPLANT
GUIDEWIRE 0.038 PTFE COATED (WIRE) IMPLANT
GUIDEWIRE ANG ZIPWIRE 038X150 (WIRE) ×2 IMPLANT
GUIDEWIRE STR DUAL SENSOR (WIRE) ×2 IMPLANT
INFUSOR MANOMETER BAG 3000ML (MISCELLANEOUS) IMPLANT
IV NS IRRIG 3000ML ARTHROMATIC (IV SOLUTION) ×4 IMPLANT
KIT TURNOVER CYSTO (KITS) ×2 IMPLANT
MANIFOLD NEPTUNE II (INSTRUMENTS) ×2 IMPLANT
NS IRRIG 500ML POUR BTL (IV SOLUTION) ×2 IMPLANT
PACK CYSTO (CUSTOM PROCEDURE TRAY) ×2 IMPLANT
SHEATH URETERAL 12FRX28CM (UROLOGICAL SUPPLIES) ×2 IMPLANT
STENT URET 6FRX26 CONTOUR (STENTS) ×2 IMPLANT
TUBE CONNECTING 12X1/4 (SUCTIONS) ×2 IMPLANT
TUBING UROLOGY SET (TUBING) ×2 IMPLANT
WATER STERILE IRR 3000ML UROMA (IV SOLUTION) IMPLANT

## 2017-09-24 NOTE — Discharge Instructions (Signed)
Post stone removal/stent placement surgery instructions   Definitions:  Ureter: The duct that transports urine from the kidney to the bladder. Stent: A plastic hollow tube that is placed into the ureter, from the kidney to the bladder to prevent the ureter from swelling shut.  General instructions:  Despite the fact that no skin incisions were used, the area around the ureter and bladder is raw and irritated. The stent is a foreign body which will further irritate the bladder wall. This irritation is manifested by increased frequency of urination, both day and night, and by an increase in the urge to urinate. In some, the urge to urinate is present almost always. Sometimes the urge is strong enough that you may not be able to stop your self from urinating. The only real cure is to remove the stent and then give time for the bladder wall to heal which can't be done until the danger of the ureter swelling shut has passed. (This varies from 2-21 days).  You may see some blood in your urine while the stent is in place and a few days afterward. Do not be alarmed, even if the urine is clear for a while. Get off your feet and drink lots of fluids until clearing occurs. If you start to pass clots or don't improve, call us.  If you have a string coming from your urethra:  The stent string is attached to your ureteral stent.  Do not pull on thisIf you have a string coming from your urethra:  The stent string is attached to your ureteral stent.  Do not pull on this.  Diet:  You may return to your normal diet immediately. Because of the raw surface of your bladder, alcohol, spicy foods, foods high in acid and drinks with caffeine may cause irritation or frequency and should be used in moderation. To keep your urine flowing freely and avoid constipation, drink plenty of fluids during the day (8-10 glasses). Tip: Avoid cranberry juice because it is very acidic.  Activity:  Your physical activity doesn't need  to be restricted. However, if you are very active, you may see some blood in the urine. We suggest that you reduce your activity under the circumstances until the bleeding has stopped.  Bowels:  It is important to keep your bowels regular during the postoperative period. Straining with bowel movements can cause bleeding. A bowel movement every other day is reasonable. Use a mild laxative if needed, such as milk of magnesia 2-3 tablespoons, or 2 Dulcolax tablets. Call if you continue to have problems. If you had been taking narcotics for pain, before, during or after your surgery, you may be constipated. Take a laxative if necessary.     Medication:  You should resume your pre-surgery medications unless told not to. DO NOT RESUME YOUR ASPIRIN, or any other medicines like ibuprofen, motrin, excedrin, advil, aleve, vitamin E, fish oil as these can all cause bleeding x 7 days. In addition you may be given an antibiotic to prevent or treat infection. Antibiotics are not always necessary. All medication should be taken as prescribed until the bottles are finished unless you are having an unusual reaction to one of the drugs.  *Restart your Xarelto in 48 hours as long as your urine is clear.  If it is bloody contact the office for instructions.  Problems you should report to [place]:  a. Fever greater than 101F. b. Heavy bleeding, or clots (see notes above about blood in urine). c. Inability  to urinate. d. Drug reactions (hives, rash, nausea, vomiting, diarrhea). e. Severe burning or pain with urination that is not improving.  Followup:  You will need a followup appointment to monitor your progress in most cases. Please call the office for this appointment when you get home if your appointment has not already been scheduled. Usually the first appointment will be about 5-14 days after your surgery and if you have a stent in place it will likely be removed at that time.     Post Anesthesia Home Care  Instructions  Activity: Get plenty of rest for the remainder of the day. A responsible individual must stay with you for 24 hours following the procedure.  For the next 24 hours, DO NOT: -Drive a car -Paediatric nurse -Drink alcoholic beverages -Take any medication unless instructed by your physician -Make any legal decisions or sign important papers.  Meals: Start with liquid foods such as gelatin or soup. Progress to regular foods as tolerated. Avoid greasy, spicy, heavy foods. If nausea and/or vomiting occur, drink only clear liquids until the nausea and/or vomiting subsides. Call your physician if vomiting continues.  Special Instructions/Symptoms: Your throat may feel dry or sore from the anesthesia or the breathing tube placed in your throat during surgery. If this causes discomfort, gargle with warm salt water. The discomfort should disappear within 24 hours.  If you had a scopolamine patch placed behind your ear for the management of post- operative nausea and/or vomiting:  1. The medication in the patch is effective for 72 hours, after which it should be removed.  Wrap patch in a tissue and discard in the trash. Wash hands thoroughly with soap and water. 2. You may remove the patch earlier than 72 hours if you experience unpleasant side effects which may include dry mouth, dizziness or visual disturbances. 3. Avoid touching the patch. Wash your hands with soap and water after contact with the patch.

## 2017-09-24 NOTE — Op Note (Signed)
PATIENT:  David [name]  PRE-OPERATIVE DIAGNOSIS:  right Ureteral calculus  POST-OPERATIVE DIAGNOSIS: Same  PROCEDURE:  1. Right retrograde pyelogram with interpretation. 2. Right ureteroscopy, laser lithotripsy, stone extraction and double-J stent placement 3. Fluoroscopy time less than 1 hour  SURGEON: Claybon Jabs, MD  INDICATION: David [name] is a 72 year old male who first began experiencing right flank pain on 08/12/2017.  He also was having gross hematuria and a CT scan revealed an 8 mm stone in the right ureter at the level of the L5-S1 interspace with Hounsfield units of 1100.  He was placed on medical expulsive therapy but his stone failed to progress.  Due to its location overlying bone it was very difficult to visualize and therefore he was not a candidate for lithotripsy and therefore is brought to the operating room for ureteroscopic management of his stone.  ANESTHESIA:  General  EBL:  Minimal  DRAINS: 6 French, 26 cm double-J stent in the right ureter (with string)  SPECIMEN: Portion of stone taken for compositional analysis  DESCRIPTION OF PROCEDURE: The patient was taken to the major OR and placed on the table. General anesthesia was administered and then the patient was moved to the dorsal lithotomy position. The genitalia was sterilely prepped and draped. An official timeout was performed.  Initially the 33 French cystoscope with 30 lens was passed under direct vision into the bladder. The bladder was then fully inspected. It was noted be free of any tumors, stones or inflammatory lesions. Ureteral orifices were of normal configuration and position. A 6 French open-ended ureteral catheter was then passed through the cystoscope into the ureteral orifice in order to perform a right retrograde pyelogram.  A retrograde pyelogram was performed by injecting full-strength contrast up the right ureter under direct fluoroscopic control. It revealed a filling defect in the mid  ureter consistent with the stone seen on the preoperative KUB. The remainder of the ureter was noted to be normal as was the intrarenal collecting system. I then passed a 0.038 inch floppy-tipped guidewire through the open ended catheter and into the area of the renal pelvis and this was left in place.  I then passed a dual-lumen catheter over the guidewire and placed a second guidewire.  The first guidewire was affixed to the drape and the second guidewire was used as my working guidewire.  I passed a ureteroscopic ureteral access sheath over the working guidewire and then remove the inner portion of the access sheath as well as working guidewire. I then proceeded with ureteroscopy.  A 6 French dual-lumen, digital, flexible ureteroscope was then passed through the access sheath and into the ureter.  I advanced the scope up the ureter and the stone was identified and I felt it was too large to extract and therefore elected to proceed with laser lithotripsy. The 200  holmium laser fiber was used to fragment the stone. I then used the nitinol basket to extract a single fragment that was the largest fragment but turned out to be only about 1 mm in size.  The rest of the stone had been adequately dusted.  Reinspection of the ureter ureteroscopically revealed no further stone fragments and no injury to the ureter. I then backloaded the cystoscope over the safety guidewire and passed the stent over the guidewire into the area of the renal pelvis. As the guidewire was removed good curl was noted in the renal pelvis. The bladder was drained and the cystoscope was then removed. The patient tolerated  the procedure well no intraoperative complications.  PLAN OF CARE: Discharge to home after PACU  PATIENT DISPOSITION:  PACU - hemodynamically stable.

## 2017-09-24 NOTE — Anesthesia Procedure Notes (Signed)
Procedure Name: LMA Insertion Date/Time: 09/24/2017 7:58 AM Performed by: Bonney Aid, CRNA Pre-anesthesia Checklist: Patient identified, Emergency Drugs available, Suction available and Patient being monitored Patient Re-evaluated:Patient Re-evaluated prior to induction Oxygen Delivery Method: Circle system utilized Preoxygenation: Pre-oxygenation with 100% oxygen Induction Type: IV induction Ventilation: Mask ventilation without difficulty LMA: LMA inserted LMA Size: 5.0 Number of attempts: 1 Airway Equipment and Method: Bite block Placement Confirmation: positive ETCO2 Dental Injury: Teeth and Oropharynx as per pre-operative assessment

## 2017-09-24 NOTE — Anesthesia Preprocedure Evaluation (Signed)
Anesthesia Evaluation  Patient identified by MRN, date of birth, ID band Patient awake    Reviewed: Allergy & Precautions, H&P , NPO status , Patient's Chart, lab work & pertinent test results  Airway Mallampati: II   Neck ROM: full    Dental   Pulmonary former smoker,    breath sounds clear to auscultation       Cardiovascular hypertension, + dysrhythmias Atrial Fibrillation  Rhythm:regular Rate:Normal     Neuro/Psych PSYCHIATRIC DISORDERS Anxiety Depression    GI/Hepatic GERD  ,  Endo/Other    Renal/GU Renal InsufficiencyRenal disease     Musculoskeletal  (+) Arthritis ,   Abdominal   Peds  Hematology   Anesthesia Other Findings   Reproductive/Obstetrics                             Anesthesia Physical Anesthesia Plan  ASA: III  Anesthesia Plan: General   Post-op Pain Management:    Induction: Intravenous  PONV Risk Score and Plan: 2 and Ondansetron, Dexamethasone and Treatment may vary due to age or medical condition  Airway Management Planned: LMA  Additional Equipment:   Intra-op Plan:   Post-operative Plan:   Informed Consent: I have reviewed the patients History and Physical, chart, labs and discussed the procedure including the risks, benefits and alternatives for the proposed anesthesia with the patient or authorized representative who has indicated his/her understanding and acceptance.     Plan Discussed with: CRNA, Anesthesiologist and Surgeon  Anesthesia Plan Comments:         Anesthesia Quick Evaluation

## 2017-09-24 NOTE — Transfer of Care (Signed)
Immediate Anesthesia Transfer of Care Note  Patient: David [name]  Procedure(s) Performed: CYSTOSCOPY, URETEROSCOPY WITH HOLMIUM LASER LITHOTRIPSY, STENT PLACEMENT (Right Renal) HOLMIUM LASER APPLICATION (Right Renal)  Patient Location: PACU  Anesthesia Type:General  Level of Consciousness: drowsy  Airway & Oxygen Therapy: Patient Spontanous Breathing and Patient connected to face mask oxygen  Post-op Assessment: Report given to RN  Post vital signs: Reviewed and stable  Last Vitals: 138/88 Vitals Value Taken Time  BP    Temp    Pulse 63 09/24/2017  8:47 AM  Resp    SpO2 98 % 09/24/2017  8:47 AM  Vitals shown include unvalidated device data.  Last Pain:  Vitals:   09/24/17 0653  TempSrc:   PainSc: 0-No pain      Patients Stated Pain Goal: 5 (13/08/[birth year] 7846)  Complications: No apparent anesthesia complications

## 2017-09-25 LAB — CULTURE, GROUP A STREP
MICRO NUMBER: 91094287
SPECIMEN QUALITY: ADEQUATE

## 2017-09-27 ENCOUNTER — Encounter (HOSPITAL_BASED_OUTPATIENT_CLINIC_OR_DEPARTMENT_OTHER): Payer: Self-pay | Admitting: Urology

## 2017-09-27 NOTE — Anesthesia Postprocedure Evaluation (Signed)
Anesthesia Post Note  Patient: KHOEN GENET  Procedure(s) Performed: CYSTOSCOPY, URETEROSCOPY WITH HOLMIUM LASER LITHOTRIPSY, STENT PLACEMENT (Right Renal) HOLMIUM LASER APPLICATION (Right Renal)     Patient location during evaluation: PACU Anesthesia Type: General Level of consciousness: awake and alert Pain management: pain level controlled Vital Signs Assessment: post-procedure vital signs reviewed and stable Respiratory status: spontaneous breathing, nonlabored ventilation, respiratory function stable and patient connected to nasal cannula oxygen Cardiovascular status: blood pressure returned to baseline and stable Postop Assessment: no apparent nausea or vomiting Anesthetic complications: no    Last Vitals:  Vitals:   09/24/17 0912 09/24/17 1225  BP: 121/75 133/72  Pulse: (!) 58 (!) 57  Resp: 16 16  Temp: 36.4 C (!) 36.3 C  SpO2: 100% 98%    Last Pain:  Vitals:   09/24/17 1225  TempSrc: Oral  PainSc: [place]

## 2017-09-28 ENCOUNTER — Ambulatory Visit: Payer: Medicare Other | Admitting: Family Medicine

## 2017-10-03 ENCOUNTER — Emergency Department (HOSPITAL_BASED_OUTPATIENT_CLINIC_OR_DEPARTMENT_OTHER)
Admission: EM | Admit: 2017-10-03 | Discharge: 2017-10-03 | Disposition: A | Discharge status: Home / Self Care | Payer: Medicare Other | Attending: Emergency Medicine | Admitting: Emergency Medicine

## 2017-10-03 ENCOUNTER — Other Ambulatory Visit: Payer: Self-pay

## 2017-10-03 ENCOUNTER — Encounter (HOSPITAL_BASED_OUTPATIENT_CLINIC_OR_DEPARTMENT_OTHER): Payer: Self-pay | Admitting: Emergency Medicine

## 2017-10-03 DIAGNOSIS — Z7901 Long term (current) use of anticoagulants: Secondary | ICD-10-CM | POA: Insufficient documentation

## 2017-10-03 DIAGNOSIS — F329 Major depressive disorder, single episode, unspecified: Secondary | ICD-10-CM | POA: Insufficient documentation

## 2017-10-03 DIAGNOSIS — N3001 Acute cystitis with hematuria: Secondary | ICD-10-CM

## 2017-10-03 DIAGNOSIS — Z87891 Personal history of nicotine dependence: Secondary | ICD-10-CM | POA: Insufficient documentation

## 2017-10-03 DIAGNOSIS — Z79899 Other long term (current) drug therapy: Secondary | ICD-10-CM | POA: Insufficient documentation

## 2017-10-03 DIAGNOSIS — M419 Scoliosis, unspecified: Secondary | ICD-10-CM | POA: Insufficient documentation

## 2017-10-03 DIAGNOSIS — F419 Anxiety disorder, unspecified: Secondary | ICD-10-CM | POA: Diagnosis not present

## 2017-10-03 DIAGNOSIS — I10 Essential (primary) hypertension: Secondary | ICD-10-CM | POA: Diagnosis not present

## 2017-10-03 DIAGNOSIS — R1031 Right lower quadrant pain: Secondary | ICD-10-CM | POA: Diagnosis present

## 2017-10-03 LAB — CBC WITH DIFFERENTIAL/PLATELET
Basophils Absolute: 0 10*3/uL (ref 0.0–0.1)
Basophils Relative: 0 %
EOS PCT: 4 %
Eosinophils Absolute: 0.3 10*3/uL (ref 0.0–0.7)
HEMATOCRIT: 43.8 % (ref 39.0–52.0)
Hemoglobin: 14.6 g/dL (ref 13.0–17.0)
LYMPHS PCT: 29 %
Lymphs Abs: 2.2 10*3/uL (ref 0.7–4.0)
MCH: 31 pg (ref 26.0–34.0)
MCHC: 33.3 g/dL (ref 30.0–36.0)
MCV: 93 fL (ref 78.0–100.0)
Monocytes Absolute: 0.8 10*3/uL (ref 0.1–1.0)
Monocytes Relative: 10 %
NEUTROS ABS: 4.3 10*3/uL (ref 1.7–7.7)
Neutrophils Relative %: 57 %
PLATELETS: 251 10*3/uL (ref 150–400)
RBC: 4.71 MIL/uL (ref 4.22–5.81)
RDW: 12.8 % (ref 11.5–15.5)
WBC: 7.7 10*3/uL (ref 4.0–10.5)

## 2017-10-03 LAB — BASIC METABOLIC PANEL
Anion gap: 8 (ref 5–15)
BUN: 26 mg/dL — AB (ref 8–23)
CO2: 27 mmol/L (ref 22–32)
Calcium: 9.9 mg/dL (ref 8.9–10.3)
Chloride: 103 mmol/L (ref 98–111)
Creatinine, Ser: 1.63 mg/dL — ABNORMAL HIGH (ref 0.61–1.24)
GFR calc Af Amer: 47 mL/min — ABNORMAL LOW (ref >60)
GFR, EST NON AFRICAN AMERICAN: 40 mL/min — AB (ref >60)
Glucose, Bld: 107 mg/dL — ABNORMAL HIGH (ref 70–99)
POTASSIUM: 4.4 mmol/L (ref 3.5–5.1)
Sodium: 138 mmol/L (ref 135–145)

## 2017-10-03 LAB — URINALYSIS, MICROSCOPIC (REFLEX): RBC / HPF: >50 RBC/hpf (ref 0–5)

## 2017-10-03 LAB — URINALYSIS, ROUTINE W REFLEX MICROSCOPIC
Bilirubin Urine: NEGATIVE
GLUCOSE, UA: NEGATIVE mg/dL
Ketones, ur: NEGATIVE mg/dL
Nitrite: POSITIVE — AB
PH: 5.5 (ref 5.0–8.0)
PROTEIN: 100 mg/dL — AB
SPECIFIC GRAVITY, URINE: 1.02 (ref 1.005–1.030)

## 2017-10-03 MED ORDER — SULFAMETHOXAZOLE-TRIMETHOPRIM 800-160 MG PO TABS: 1.0000 | ORAL_TABLET | Freq: Two times a day (BID) | ORAL | 0 refills | Status: DC

## 2017-10-03 MED ORDER — SULFAMETHOXAZOLE-TRIMETHOPRIM 800-160 MG PO TABS
1.0000 | ORAL_TABLET | Freq: Once | ORAL | Status: AC
  Administered 2017-10-03: 1 via ORAL
  Filled 2017-10-03: qty 1

## 2017-10-03 NOTE — ED Triage Notes (Signed)
Reports kidney stone stent pulled Thursday 9/19. Reports some spasms after, but improved after shot in Dr office. States he has taken his Vicodin sparingly since. States spasms returned suddenly at 0230 today. Reports hematuria, which his MD is aware of. Also reports dark stools without frank blood.

## 2017-10-03 NOTE — ED Provider Notes (Signed)
[facility] Provider Note  CSN: [id number] Arrival date & time: 10/03/17 0350  Chief Complaint(s) Abdominal Pain  HPI David [name] is a 72 y.o. male more extensive past medical history listed below including renal stones requiring lithotripsy and stent that was removed several days ago who presents to the emergency department with intermittent right lower quadrant abdominal cramping since the removal of the stent.  He is also endorsing hematuria.  States that he had an episode tonight that was severe in nature.  Did not initially improve with Norco.  However since arriving here the pain has significantly improved.  He denies any associated fevers or chills.  No nausea or vomiting.  HPI  Past Medical History Past Medical History:  Diagnosis Date  . Abnormal liver function 03/18/2013  . Adenoma of right adrenal gland 07/11/2002   2.4cm , noted on CT ABD  . Anxiety   . Arthritis of both knees 03/26/2016  . Astigmatism   . Atrial fibrillation ([place])    x2 successful cardioversions  . Back pain   . BCC (basal cell carcinoma of skin)    under right eye and right ear  . Cataract 09/29/2016  . Cataracts, bilateral 03/26/2016  . Depression   . Diverticulitis   . Diverticulosis   . Fatty liver   . GERD (gastroesophageal reflux disease)   . Gout   . Hearing loss of both ears 03/26/2016  . History of chronic prostatitis    started at age 56  . History of kidney stones   . Hyperglycemia 03/29/2016  . Hyperlipidemia   . Hypertension   . IBS (irritable bowel syndrome) 03/18/2013  . Incomplete right bundle branch block (RBBB)   . Internal hemorrhoids   . Kidney lesion 06/07/2015   Right midportion, 1.1x1.1 cm hyperechoic, noted on [place] ABD  . LAFB (left anterior fascicular block)   . Lipoma of axilla 09/2016  . Liver lesion, right lobe 06/07/2015   2.5x2.4x2.4 cm hypoechoic lesion posterior aspect, noted on [place] ABD  . Low back pain 03/26/2016  . LVH (left  ventricular hypertrophy) 08/06/2017   Moderate, noted on ECHO  . Medicare annual wellness visit, subsequent 03/12/2014  . Muscle cramps   . Nocturia   . OA (osteoarthritis)    Back, Hands, Neck  . Obesity 11/25/2007   Qualifier: Diagnosis of  By: Lenna Gilford MD, Deborra Medina   . Other malaise and fatigue 03/13/2013  . Premature ventricular complex   . Preventative health care 03/07/2015  . Rectal irritation 09/18/2012  . Renal insufficiency   . Scoliosis    Upper thoracic and lumbar  . Tinea corporis 03/13/2013  . Unspecified constipation 09/25/2013  . Vitamin D deficiency    resolved   Patient Active Problem List   Diagnosis Date Noted  . Kidney stone 09/18/2017  . Insomnia 09/17/2017  . Abnormal TSH 04/15/2017  . Cataract 09/29/2016  . Muscle cramp 09/29/2016  . Nocturia 09/29/2016  . Hyperglycemia 03/29/2016  . Low back pain 03/26/2016  . Cataracts, bilateral 03/26/2016  . Arthritis of both knees 03/26/2016  . Hearing loss 03/26/2016  . Complex renal cyst 06/16/2015  . History of atrial fibrillation 06/16/2015  . Snoring 06/07/2015  . Somnolence 06/07/2015  . Preventative health care 03/07/2015  . Lipoma of axilla 06/04/2014  . Tinea cruris 03/20/2014  . Lichen simplex chronicus 03/20/2014  . Medicare annual wellness visit, subsequent 03/12/2014  . Depression with anxiety 09/25/2013  . Constipation 09/25/2013  . Gouty arthritis  of toe of left foot 08/01/2013  . Vitamin D deficiency 06/18/2013  . Skin rash 05/27/2013  . Abnormal liver function 03/18/2013  . IBS (irritable bowel syndrome) 03/18/2013  . Other malaise and fatigue 03/13/2013  . Tinea corporis 03/13/2013  . Gout 12/14/2012  . Diarrhea 12/14/2012  . Renal insufficiency 12/14/2012  . Rectal irritation 09/18/2012  . Diverticulosis   . BCC (basal cell carcinoma of skin)   . Scoliosis 01/19/2011  . Atrial fibrillation ([place]) 11/27/2010  . Hyperlipidemia, mixed 03/11/2010  . Essential hypertension, benign 03/11/2010    . ALLERGIC RHINITIS 03/11/2010  . Obesity 11/25/2007  . BENIGN NEOPLASM OF ADRENAL GLAND 09/22/2007  . Fatty liver disease, nonalcoholic 07/08/9483  . Osteoarthritis 09/22/2007  . SPONDYLOSIS, LUMBAR 09/22/2007  . GERD 04/12/2007   Home Medication(s) Prior to Admission medications   Medication Sig Start Date End Date Taking? Authorizing Provider  acetaminophen (TYLENOL) 325 MG tablet Take 650 mg by mouth every 6 (six) hours as needed.    [provider]  alum hydroxide-mag trisilicate (GAVISCON) 46-27 MG CHEW Chew by mouth as needed.     [provider]  atenolol (TENORMIN) 25 MG tablet TAKE ONE TABLET BY MOUTH TWICE A DAY Patient taking differently: 25 mg.  09/08/17   Mosie Lukes, MD  Colchicine 0.6 MG CAPS 1 tab po bid Patient taking differently: as needed. 1 tab po bid 09/07/17   Saguier, Percell Miller, PA-C  cyclobenzaprine (FLEXERIL) 5 MG tablet Take 1 tablet (5 mg total) by mouth 3 (three) times daily as needed for muscle spasms. Patient taking differently: Take 5 mg by mouth as needed for muscle spasms.  07/20/17   Mosie Lukes, MD  fluticasone (FLONASE) 50 MCG/ACT nasal spray Place 2 sprays into the nose daily as needed. For congestion    [provider]  HYDROcodone-acetaminophen (NORCO) 10-325 MG tablet Take 1-2 tablets by mouth every 4 (four) hours as needed for moderate pain. Maximum dose per 24 hours - 8 pills 09/24/17   Kathie Rhodes, MD  irbesartan (AVAPRO) 150 MG tablet Take 1 tablet (150 mg total) by mouth daily. 07/27/17   Mosie Lukes, MD  LORazepam (ATIVAN) 1 MG tablet TAKE ONE TABLET BY MOUTH EVERY 8 HOURS AS NEEDED FOR SLEEP OR ANXIETY Patient taking differently: 0.5 mg 2 (two) times daily.  08/27/17   Mosie Lukes, MD  methylPREDNISolone acetate (DEPO-MEDROL) 80 MG/ML injection Inject 80 mg into the muscle once.    [provider]  mupirocin ointment (BACTROBAN) 2 % Apply to area twice daily. Patient taking differently: as needed.  Apply to area twice daily. 05/17/17   Saguier, Percell Miller, PA-C  omeprazole (PRILOSEC) 20 MG capsule TAKE ONE CAPSULE BY MOUTH DAILY Patient taking differently: daily.  08/09/17   Mosie Lukes, MD  phenazopyridine (PYRIDIUM) 200 MG tablet Take 1 tablet (200 mg total) by mouth 3 (three) times daily as needed for pain. 09/24/17   Kathie Rhodes, MD  Polyvinyl Alcohol-Povidone PF (REFRESH) 1.4-0.6 % SOLN Apply to eye as needed.    [provider]  rivaroxaban (XARELTO) 20 MG TABS tablet Take 1 tablet (20 mg total) by mouth daily with supper. 09/01/17   Sherran Needs, NP  sertraline (ZOLOFT) 50 MG tablet Take 1 tablet (50 mg total) by mouth daily. Patient taking differently: Take 50 mg by mouth at bedtime.  09/17/17   Mosie Lukes, MD  sulfamethoxazole-trimethoprim (BACTRIM DS,SEPTRA DS) 800-160 MG tablet Take 1 tablet by mouth 2 (two) times  daily for 7 days. 10/03/17 10/10/17  Fatima Blank, MD  tamsulosin (FLOMAX) 0.4 MG CAPS capsule Take 0.4 mg by mouth daily after supper.    [provider]                                                                                                                                    Past Surgical History Past Surgical History:  Procedure Laterality Date  . CARDIOVERSION  07/31/2017  . CATARACT EXTRACTION, BILATERAL    . COLON SURGERY  2009   segmental sigmoid resection  . COLONOSCOPY    . HOLMIUM LASER APPLICATION Right 03/03/2540   Procedure: HOLMIUM LASER APPLICATION;  Surgeon: Kathie Rhodes, MD;  Location: [facility];  Service: Urology;  Laterality: Right;  . NOSE SURGERY     Submucous resection age 57  . NUCLEAR STRESS TEST  06/03/2009  . SKIN BIOPSY    . URETEROSCOPY WITH HOLMIUM LASER LITHOTRIPSY Right 09/24/2017   Procedure: CYSTOSCOPY, URETEROSCOPY WITH HOLMIUM LASER LITHOTRIPSY, STENT PLACEMENT;  Surgeon: Kathie Rhodes, MD;  Location: [facility];  Service: Urology;  Laterality: Right;    Family History Family History  Problem Relation Age of Onset  . Heart disease Mother   . Hypertension Mother   . Stroke Mother   . Colon cancer Mother   . Breast cancer Mother   . Heart disease Father        pacemaker  . Aortic aneurysm Father   . Hypertension Father   . Heart disease Sister   . Atrial fibrillation Sister   . Obesity Sister   . Sleep apnea Sister   . Heart attack Brother   . Other Brother        muscle disease  . Arthritis Brother   . Stroke Brother   . Atrial fibrillation Brother   . Cancer Maternal Grandmother        ?  Marland Kitchen Heart attack Maternal Grandmother   . Diabetes Maternal Grandmother   . Cancer Maternal Grandfather        hodgin's lymphoma  . Heart attack Paternal Grandmother   . Anxiety disorder Paternal Grandmother   . Pneumonia Paternal Grandfather   . Heart attack Brother   . Atrial fibrillation Brother   . Atrial fibrillation Brother   . Heart attack Brother   . Hypertension Brother   . Hyperlipidemia Brother   . Heart attack Brother   . Other Brother        heart valve operation  . Atrial fibrillation Brother     Social History Social History   Tobacco Use  . Smoking status: Former Smoker    Packs/day: 1.00    Years: 6.00    Pack years: 6.00    Types: Cigarettes    Start date: 01/12/1969  . Smokeless tobacco: Never Used  . Tobacco comment: [id number]  Substance Use Topics  . Alcohol use: Not  Currently    Comment: 2 drinks 5 nights a week   . Drug use: No   Allergies Cefaclor; Cephalosporins; and Penicillins  Review of Systems Review of Systems All other systems are reviewed and are negative for acute change except as noted in the HPI  Physical Exam Vital Signs  I have reviewed the triage vital signs BP (!) 163/94 (BP Location: Right Arm)   Pulse 77   Temp 98.1 F (36.7 C) (Oral)   Resp 17   Ht 6' (1.829 m)   Wt 110.2 kg   SpO2 95%   BMI 32.96 kg/m   Physical Exam  Constitutional: He is oriented to person,  place, and time. He appears well-developed and well-nourished. No distress.  HENT:  Head: Normocephalic and atraumatic.  Right Ear: External ear normal.  Left Ear: External ear normal.  Nose: Nose normal.  Mouth/Throat: Mucous membranes are normal. No trismus in the jaw.  Eyes: Conjunctivae and EOM are normal. No scleral icterus.  Neck: Normal range of motion and phonation normal.  Cardiovascular: Normal rate and regular rhythm.  Pulmonary/Chest: Effort normal. No stridor. No respiratory distress.  Abdominal: He exhibits no distension. There is no tenderness. There is no rigidity, no rebound, no guarding and no CVA tenderness.  Musculoskeletal: Normal range of motion. He exhibits no edema.  Neurological: He is alert and oriented to person, place, and time.  Skin: He is not diaphoretic.  Psychiatric: He has a normal mood and affect. His behavior is normal.  Vitals reviewed.   ED Results and Treatments Labs (all labs ordered are listed, but only abnormal results are displayed) Labs Reviewed  BASIC METABOLIC PANEL - Abnormal; Notable for the following components:      Result Value   Glucose, Bld 107 (*)    BUN 26 (*)    Creatinine, Ser 1.63 (*)    GFR calc non Af Amer 40 (*)    GFR calc Af Amer 47 (*)    All other components within normal limits  URINALYSIS, ROUTINE W REFLEX MICROSCOPIC - Abnormal; Notable for the following components:   Color, Urine AMBER (*)    APPearance HAZY (*)    Hgb urine dipstick LARGE (*)    Protein, ur 100 (*)    Nitrite POSITIVE (*)    Leukocytes, UA TRACE (*)    All other components within normal limits  URINALYSIS, MICROSCOPIC (REFLEX) - Abnormal; Notable for the following components:   Bacteria, UA FEW (*)    All other components within normal limits  URINE CULTURE  CBC WITH DIFFERENTIAL/PLATELET                                                                                                                         EKG  EKG  Interpretation  Date/Time:    Ventricular Rate:    PR Interval:    QRS Duration:   QT Interval:    QTC Calculation:   R Axis:  Text Interpretation:        Radiology No results found. Pertinent labs & imaging results that were available during my care of the patient were reviewed by me and considered in my medical decision making (see chart for details).  Medications Ordered in ED Medications  sulfamethoxazole-trimethoprim (BACTRIM DS,SEPTRA DS) 800-160 MG per tablet 1 tablet (1 tablet Oral Given 10/03/17 0529)                                                                                                                                    Procedures Procedures  (including critical care time)  Medical Decision Making / ED Course I have reviewed the nursing notes for this encounter and the patient's prior records (if available in EHR or on provided paperwork).    Abdomen benign.  Low suspicion for serious intra-abdominal inflammatory/infectious process requiring imaging at this time.    Presentation appears to be renal colic.  Given recent instrumentation will need to rule out infectious process.  CBC without leukocytosis.  BMP with stable renal function.  UA consistent with a urinary tract infection.  Appropriate for outpatient management.  Given first dose of Bactrim in the emergency department.  The patient appears reasonably screened and/or stabilized for discharge and I doubt any other medical condition or other [facility] requiring further screening, evaluation, or treatment in the ED at this time prior to discharge.  The patient is safe for discharge with strict return precautions.   Final Clinical Impression(s) / ED Diagnoses Final diagnoses:  Acute cystitis with hematuria    Disposition: Discharge  Condition: Good  I have discussed the results, Dx and Tx plan with the patient who expressed understanding and agree(s) with the plan. Discharge instructions discussed  at great length. The patient was given strict return precautions who verbalized understanding of the instructions. No further questions at time of discharge.    ED Discharge Orders         Ordered    sulfamethoxazole-trimethoprim (BACTRIM DS,SEPTRA DS) 800-160 MG tablet  2 times daily     10/03/17 0535           Follow Up: Mosie Lukes, MD [place] [address] [phone]  Schedule an appointment as soon as possible for a visit  As needed  Kathie Rhodes, [place] [address] [phone]  Call  in 2- days for culture results to ensure adequate treatment. schedule an appointment in 5-7 days, If symptoms do not improve or  worsen     This chart was dictated using voice recognition software.  Despite best efforts to proofread,  errors can occur which can change the documentation meaning.   Fatima Blank, MD 10/03/17 [phone]

## 2017-10-04 LAB — URINE CULTURE: Culture: <10000 — AB

## 2017-10-08 ENCOUNTER — Encounter: Payer: Self-pay | Admitting: Family Medicine

## 2017-10-08 ENCOUNTER — Other Ambulatory Visit: Payer: Self-pay | Admitting: Family Medicine

## 2017-10-08 ENCOUNTER — Encounter (HOSPITAL_BASED_OUTPATIENT_CLINIC_OR_DEPARTMENT_OTHER): Payer: Self-pay | Admitting: Emergency Medicine

## 2017-10-08 ENCOUNTER — Emergency Department (HOSPITAL_BASED_OUTPATIENT_CLINIC_OR_DEPARTMENT_OTHER)
Admission: EM | Admit: 2017-10-08 | Discharge: 2017-10-08 | Disposition: A | Discharge status: Home / Self Care | Payer: Medicare Other | Attending: Emergency Medicine | Admitting: Emergency Medicine

## 2017-10-08 ENCOUNTER — Other Ambulatory Visit: Payer: Self-pay

## 2017-10-08 ENCOUNTER — Ambulatory Visit: Payer: Medicare Other | Admitting: Family Medicine

## 2017-10-08 VITALS — BP 108/59 | HR 61 | Temp 97.9°F | Resp 18 | Wt 237.6 lb

## 2017-10-08 DIAGNOSIS — I10 Essential (primary) hypertension: Secondary | ICD-10-CM

## 2017-10-08 DIAGNOSIS — E6609 Other obesity due to excess calories: Secondary | ICD-10-CM

## 2017-10-08 DIAGNOSIS — E782 Mixed hyperlipidemia: Secondary | ICD-10-CM | POA: Diagnosis not present

## 2017-10-08 DIAGNOSIS — Z79899 Other long term (current) drug therapy: Secondary | ICD-10-CM | POA: Insufficient documentation

## 2017-10-08 DIAGNOSIS — R739 Hyperglycemia, unspecified: Secondary | ICD-10-CM | POA: Diagnosis not present

## 2017-10-08 DIAGNOSIS — N23 Unspecified renal colic: Secondary | ICD-10-CM | POA: Diagnosis not present

## 2017-10-08 DIAGNOSIS — I480 Paroxysmal atrial fibrillation: Secondary | ICD-10-CM

## 2017-10-08 DIAGNOSIS — R319 Hematuria, unspecified: Secondary | ICD-10-CM | POA: Diagnosis not present

## 2017-10-08 DIAGNOSIS — Z7901 Long term (current) use of anticoagulants: Secondary | ICD-10-CM | POA: Diagnosis not present

## 2017-10-08 DIAGNOSIS — E559 Vitamin D deficiency, unspecified: Secondary | ICD-10-CM

## 2017-10-08 DIAGNOSIS — N289 Disorder of kidney and ureter, unspecified: Secondary | ICD-10-CM

## 2017-10-08 DIAGNOSIS — R109 Unspecified abdominal pain: Secondary | ICD-10-CM | POA: Diagnosis present

## 2017-10-08 DIAGNOSIS — F418 Other specified anxiety disorders: Secondary | ICD-10-CM

## 2017-10-08 DIAGNOSIS — Z87891 Personal history of nicotine dependence: Secondary | ICD-10-CM | POA: Diagnosis not present

## 2017-10-08 DIAGNOSIS — M109 Gout, unspecified: Secondary | ICD-10-CM | POA: Diagnosis not present

## 2017-10-08 DIAGNOSIS — N200 Calculus of kidney: Secondary | ICD-10-CM

## 2017-10-08 LAB — URINALYSIS, ROUTINE W REFLEX MICROSCOPIC
BILIRUBIN URINE: NEGATIVE
Bilirubin Urine: NEGATIVE
GLUCOSE, UA: NEGATIVE mg/dL
KETONES UR: NEGATIVE
Ketones, ur: NEGATIVE mg/dL
NITRITE: NEGATIVE
Protein, ur: 30 mg/dL — AB
SPECIFIC GRAVITY, URINE: 1.025 (ref 1.000–1.030)
SPECIFIC GRAVITY, URINE: 1.02 (ref 1.005–1.030)
TOTAL PROTEIN, URINE-UPE24: 100 — AB
UROBILINOGEN UA: 1 (ref 0.0–1.0)
Urine Glucose: NEGATIVE
pH: 6.5 (ref 5.0–8.0)
pH: 5.5 (ref 5.0–8.0)

## 2017-10-08 LAB — LIPID PANEL
CHOLESTEROL: 202 mg/dL — AB (ref 0–200)
HDL: 33 mg/dL — ABNORMAL LOW (ref >39.00)
LDL CALC: 151 mg/dL — AB (ref 0–99)
NonHDL: 169.39
TRIGLYCERIDES: 94 mg/dL (ref 0.0–149.0)
Total CHOL/HDL Ratio: 6
VLDL: 18.8 mg/dL (ref 0.0–40.0)

## 2017-10-08 LAB — COMPREHENSIVE METABOLIC PANEL
ALBUMIN: 4.2 g/dL (ref 3.5–5.2)
ALK PHOS: 73 U/L (ref 39–117)
ALT: 24 U/L (ref 0–53)
AST: 21 U/L (ref 0–37)
BUN: 30 mg/dL — AB (ref 6–23)
CALCIUM: 10.5 mg/dL (ref 8.4–10.5)
CO2: 26 mEq/L (ref 19–32)
CREATININE: 2.11 mg/dL — AB (ref 0.40–1.50)
Chloride: 103 mEq/L (ref 96–112)
GFR: 32.94 mL/min — ABNORMAL LOW (ref >60.00)
Glucose, Bld: 97 mg/dL (ref 70–99)
Potassium: 4.7 mEq/L (ref 3.5–5.1)
SODIUM: 135 meq/L (ref 135–145)
Total Bilirubin: 0.8 mg/dL (ref 0.2–1.2)
Total Protein: 6.9 g/dL (ref 6.0–8.3)

## 2017-10-08 LAB — CBC WITH DIFFERENTIAL/PLATELET
BASOS PCT: 0.8 % (ref 0.0–3.0)
Basophils Absolute: 0.1 10*3/uL (ref 0.0–0.1)
EOS ABS: 0.2 10*3/uL (ref 0.0–0.7)
Eosinophils Relative: 3.3 % (ref 0.0–5.0)
HEMATOCRIT: 41.8 % (ref 39.0–52.0)
HEMOGLOBIN: 14.1 g/dL (ref 13.0–17.0)
LYMPHS PCT: 22.8 % (ref 12.0–46.0)
Lymphs Abs: 1.7 10*3/uL (ref 0.7–4.0)
MCHC: 33.7 g/dL (ref 30.0–36.0)
MCV: 91.5 fl (ref 78.0–100.0)
MONO ABS: 0.5 10*3/uL (ref 0.1–1.0)
Monocytes Relative: 6.8 % (ref 3.0–12.0)
Neutro Abs: 5 10*3/uL (ref 1.4–7.7)
Neutrophils Relative %: 66.3 % (ref 43.0–77.0)
Platelets: 252 10*3/uL (ref 150.0–400.0)
RBC: 4.56 Mil/uL (ref 4.22–5.81)
RDW: 13.9 % (ref 11.5–15.5)
WBC: 7.5 10*3/uL (ref 4.0–10.5)

## 2017-10-08 LAB — URIC ACID: URIC ACID, SERUM: 7.5 mg/dL (ref 4.0–7.8)

## 2017-10-08 LAB — URINALYSIS, MICROSCOPIC (REFLEX): RBC / HPF: >50 RBC/hpf (ref 0–5)

## 2017-10-08 LAB — TSH: TSH: 3.05 u[IU]/mL (ref 0.35–4.50)

## 2017-10-08 LAB — HEMOGLOBIN A1C: HEMOGLOBIN A1C: 5.7 % (ref 4.6–6.5)

## 2017-10-08 MED ORDER — IRBESARTAN 150 MG PO TABS: 75.0000 mg | ORAL_TABLET | Freq: Every day | ORAL | 1 refills | Status: DC

## 2017-10-08 NOTE — ED Notes (Signed)
ED Provider at bedside. 

## 2017-10-08 NOTE — Assessment & Plan Note (Signed)
Supplement and monitor 

## 2017-10-08 NOTE — Patient Instructions (Addendum)
Encouraged increased hydration and fiber in diet. Daily probiotics. If bowels not moving can use MOM 2 tbls po in 4 oz of warm prune juice by mouth every 2-3 days. If no results then repeat in 4 hours with  Dulcolax suppository pr, may repeat again in 4 more hours as needed. Seek care if symptoms worsen. Consider daily Miralax and/or Dulcolax if symptoms persist.    Kidney Stones Kidney stones (urolithiasis) are solid, rock-like deposits that form inside of the organs that make urine (kidneys). A kidney stone may form in a kidney and move into the bladder, where it can cause intense pain and block the flow of urine. Kidney stones are created when high levels of certain minerals are found in the urine. They are usually passed through urination, but in some cases, medical treatment may be needed to remove them. What are the causes? Kidney stones may be caused by:  A condition in which certain glands produce too much parathyroid hormone (primary hyperparathyroidism), which causes too much calcium buildup in the blood.  Buildup of uric acid crystals in the bladder (hyperuricosuria). Uric acid is a chemical that the body produces when you eat certain foods. It usually exits the body in the urine.  Narrowing (stricture) of one or both of the tubes that drain urine from the kidneys to the bladder (ureters).  A kidney blockage that is present at birth (congenital obstruction).  Past surgery on the kidney or the ureters, such as gastric bypass surgery.  What increases the risk? The following factors make you more likely to develop kidney stones:  Having had a kidney stone in the past.  Having a family history of kidney stones.  Not drinking enough water.  Eating a diet that is high in protein, salt (sodium), or sugar.  Being overweight or obese.  What are the signs or symptoms? Symptoms of a kidney stone may include:  Nausea.  Vomiting.  Blood in the urine (hematuria).  Pain in the  side of the abdomen, right below the ribs (flank pain). Pain usually spreads (radiates) to the groin.  Needing to urinate frequently or urgently.  How is this diagnosed? This condition may be diagnosed based on:  Your medical history.  A physical exam.  Blood tests.  Urine tests.  CT scan.  Abdominal X-ray.  A procedure to examine the inside of the bladder (cystoscopy).  How is this treated? Treatment for kidney stones depends on the size, location, and makeup of the stones. Treatment may involve:  Analyzing your urine before and after you pass the stone through urination.  Being monitored at the hospital until you pass the stone through urination.  Increasing your fluid intake and decreasing the amount of calcium and protein in your diet.  A procedure to break up kidney stones in the bladder using: ? A focused beam of light (laser therapy). ? Shock waves (extracorporeal shock wave lithotripsy).  Surgery to remove kidney stones. This may be needed if you have severe pain or have stones that block your urinary tract.  Follow these instructions at home: Eating and drinking   Drink enough fluid to keep your urine clear or pale yellow. This will help you to pass the kidney stone.  If directed, change your diet. This may include: ? Limiting how much sodium you eat. ? Eating more fruits and vegetables. ? Limiting how much meat, poultry, fish, and eggs you eat.  Follow instructions from your health care provider about eating or drinking restrictions. General  instructions  Collect urine samples as told by your health care provider. You may need to collect a urine sample: ? 24 hours after you pass the stone. ? 8-12 weeks after passing the kidney stone, and every 6-12 months after that.  Strain your urine every time you urinate, for as long as directed. Use the strainer that your health care provider recommends.  Do not throw out the kidney stone after passing it. Keep  the stone so it can be tested by your health care provider. Testing the makeup of your kidney stone may help prevent you from getting kidney stones in the future.  Take over-the-counter and prescription medicines only as told by your health care provider.  Keep all follow-up visits as told by your health care provider. This is important. You may need follow-up X-rays or ultrasounds to make sure that your stone has passed. How is this prevented? To prevent another kidney stone:  Drink enough fluid to keep your urine clear or pale yellow. This is the best way to prevent kidney stones.  Eat a healthy diet and follow recommendations from your health care provider about foods to avoid. You may be instructed to eat a low-protein diet. Recommendations vary depending on the type of kidney stone that you have.  Maintain a healthy weight.  Contact a health care provider if:  You have pain that gets worse or does not get better with medicine. Get help right away if:  You have a fever or chills.  You develop severe pain.  You develop new abdominal pain.  You faint.  You are unable to urinate. This information is not intended to replace advice given to you by your health care provider. Make sure you discuss any questions you have with your health care provider. Document Released: 12/29/2004 Document Revised: 07/19/2015 Document Reviewed: 06/14/2015 Elsevier Interactive Patient Education  [employer].

## 2017-10-08 NOTE — ED Notes (Signed)
Intermittent pain with urinating past week. S/p kidney stone sugery and stent. Denies n/v.d

## 2017-10-08 NOTE — Assessment & Plan Note (Addendum)
Well controlled, will drop his Avapro to 75 mg daily due to recent low numbers and light headedness. Encouraged heart healthy diet such as the DASH diet and exercise as tolerated.

## 2017-10-08 NOTE — Assessment & Plan Note (Signed)
hgba1c acceptable, minimize simple carbs. Increase exercise as tolerated.  

## 2017-10-08 NOTE — ED Triage Notes (Signed)
Reports kidney stone removal 2 weeks ago. Stent removed 1 week ago.  Reports that the urologist has told him that he now has abdominal cramping due to pulling the stent out.  Patient states this pain has been intermittent but urology told him he has sediment in the ureter that is causing his pain

## 2017-10-08 NOTE — Assessment & Plan Note (Signed)
Taking Sertraline but is frustrated the side effects.

## 2017-10-08 NOTE — Assessment & Plan Note (Signed)
Encouraged heart healthy diet, increase exercise, avoid trans fats, consider a krill oil cap daily 

## 2017-10-08 NOTE — Assessment & Plan Note (Addendum)
Hydrate and monitor, worse after recent stones and lithotripsy. Will continue to monitor

## 2017-10-08 NOTE — Assessment & Plan Note (Signed)
Sees Dr Percival Spanish on Monday and is doing well.

## 2017-10-08 NOTE — ED Provider Notes (Signed)
[name] Provider Note   CSN: [id number] Arrival date & time: 10/08/17  1821     History   Chief Complaint Chief Complaint  Patient presents with  . Post-op Problem    HPI David [name] is a 72 y.o. male.  72 year old male with extensive past medical history including atrial fibrillation on anticoagulation, kidney stones, CKD who presents with right side pain.  The patient recently had a lithotripsy and ureteral stent placement for kidney stone.  He had the stent taken out 1 week ago which initially caused some severe pain that eventually resolved.  He came to the ER on 9/22 for increased pain, was started on Bactrim for possible UTI but ultimately was determined that his pain was related to colic from his previous procedures.  He saw his urologist in the clinic yesterday and was told that intermittent pain and hematuria are expected after such procedures.  He saw his PCP today for a checkup and was doing well.  This evening just prior to arrival, he had a sudden onset of sharp right side pain that radiated towards his lower abdomen.  He initially could not void.  Once he got to the ED, he was able to urinate which resolved his pain.  Currently he is pain-free.  He denies any vomiting or fevers.  The history is provided by the patient.    Past Medical History:  Diagnosis Date  . Abnormal liver function 03/18/2013  . Adenoma of right adrenal gland 07/11/2002   2.4cm , noted on CT ABD  . Anxiety   . Arthritis of both knees 03/26/2016  . Astigmatism   . Atrial fibrillation ([place])    x2 successful cardioversions  . Back pain   . BCC (basal cell carcinoma of skin)    under right eye and right ear  . Cataract 09/29/2016  . Cataracts, bilateral 03/26/2016  . Depression   . Diverticulitis   . Diverticulosis   . Fatty liver   . GERD (gastroesophageal reflux disease)   . Gout   . Hearing loss of both ears 03/26/2016  . History of chronic prostatitis      started at age 66  . History of kidney stones   . Hyperglycemia 03/29/2016  . Hyperlipidemia   . Hypertension   . IBS (irritable bowel syndrome) 03/18/2013  . Incomplete right bundle branch block (RBBB)   . Internal hemorrhoids   . Kidney lesion 06/07/2015   Right midportion, 1.1x1.1 cm hyperechoic, noted on [place] ABD  . LAFB (left anterior fascicular block)   . Lipoma of axilla 09/2016  . Liver lesion, right lobe 06/07/2015   2.5x2.4x2.4 cm hypoechoic lesion posterior aspect, noted on [place] ABD  . Low back pain 03/26/2016  . LVH (left ventricular hypertrophy) 08/06/2017   Moderate, noted on ECHO  . Medicare annual wellness visit, subsequent 03/12/2014  . Muscle cramps   . Nocturia   . OA (osteoarthritis)    Back, Hands, Neck  . Obesity 11/25/2007   Qualifier: Diagnosis of  By: Lenna Gilford MD, Deborra Medina   . Other malaise and fatigue 03/13/2013  . Premature ventricular complex   . Preventative health care 03/07/2015  . Rectal irritation 09/18/2012  . Renal insufficiency   . Scoliosis    Upper thoracic and lumbar  . Tinea corporis 03/13/2013  . Unspecified constipation 09/25/2013  . Vitamin D deficiency    resolved    Patient Active Problem List   Diagnosis Date Noted  . Kidney  stone 09/18/2017  . Insomnia 09/17/2017  . Abnormal TSH 04/15/2017  . Cataract 09/29/2016  . Muscle cramp 09/29/2016  . Nocturia 09/29/2016  . Hyperglycemia 03/29/2016  . Low back pain 03/26/2016  . Cataracts, bilateral 03/26/2016  . Arthritis of both knees 03/26/2016  . Hearing loss 03/26/2016  . Complex renal cyst 06/16/2015  . History of atrial fibrillation 06/16/2015  . Snoring 06/07/2015  . Somnolence 06/07/2015  . Preventative health care 03/07/2015  . Lipoma of axilla 06/04/2014  . Tinea cruris 03/20/2014  . Lichen simplex chronicus 03/20/2014  . Medicare annual wellness visit, subsequent 03/12/2014  . Depression with anxiety 09/25/2013  . Constipation 09/25/2013  . Gouty arthritis of toe of left foot  08/01/2013  . Skin rash 05/27/2013  . Abnormal liver function 03/18/2013  . IBS (irritable bowel syndrome) 03/18/2013  . Other malaise and fatigue 03/13/2013  . Tinea corporis 03/13/2013  . Gout 12/14/2012  . Diarrhea 12/14/2012  . Renal insufficiency 12/14/2012  . Rectal irritation 09/18/2012  . Diverticulosis   . BCC (basal cell carcinoma of skin)   . Scoliosis 01/19/2011  . Atrial fibrillation ([place]) 11/27/2010  . Hyperlipidemia, mixed 03/11/2010  . Essential hypertension, benign 03/11/2010  . ALLERGIC RHINITIS 03/11/2010  . Obesity 11/25/2007  . BENIGN NEOPLASM OF ADRENAL GLAND 09/22/2007  . Fatty liver disease, nonalcoholic 57/32/2025  . Osteoarthritis 09/22/2007  . SPONDYLOSIS, LUMBAR 09/22/2007  . GERD 04/12/2007    Past Surgical History:  Procedure Laterality Date  . CARDIOVERSION  07/31/2017  . CATARACT EXTRACTION, BILATERAL    . COLON SURGERY  2009   segmental sigmoid resection  . COLONOSCOPY    . HOLMIUM LASER APPLICATION Right 05/08/621   Procedure: HOLMIUM LASER APPLICATION;  Surgeon: Kathie Rhodes, MD;  Location: [facility];  Service: Urology;  Laterality: Right;  . NOSE SURGERY     Submucous resection age 31  . NUCLEAR STRESS TEST  06/03/2009  . SKIN BIOPSY    . URETEROSCOPY WITH HOLMIUM LASER LITHOTRIPSY Right 09/24/2017   Procedure: CYSTOSCOPY, URETEROSCOPY WITH HOLMIUM LASER LITHOTRIPSY, STENT PLACEMENT;  Surgeon: Kathie Rhodes, MD;  Location: [facility];  Service: Urology;  Laterality: Right;        Home Medications    Prior to Admission medications   Medication Sig Start Date End Date Taking? Authorizing Provider  acetaminophen (TYLENOL) 325 MG tablet Take 650 mg by mouth every 6 (six) hours as needed.    [provider]  alum hydroxide-mag trisilicate (GAVISCON) 76-28 MG CHEW Chew by mouth as needed.     [provider]  atenolol (TENORMIN) 25 MG tablet TAKE ONE TABLET BY MOUTH TWICE A DAY Patient  taking differently: 25 mg.  09/08/17   Mosie Lukes, MD  Colchicine 0.6 MG CAPS 1 tab po bid Patient taking differently: as needed. 1 tab po bid 09/07/17   Saguier, Percell Miller, PA-C  cyclobenzaprine (FLEXERIL) 5 MG tablet Take 1 tablet (5 mg total) by mouth 3 (three) times daily as needed for muscle spasms. Patient taking differently: Take 5 mg by mouth as needed for muscle spasms.  07/20/17   Mosie Lukes, MD  fluticasone (FLONASE) 50 MCG/ACT nasal spray Place 2 sprays into the nose daily as needed. For congestion    [provider]  HYDROcodone-acetaminophen (NORCO) 10-325 MG tablet Take 1-2 tablets by mouth every 4 (four) hours as needed for moderate pain. Maximum dose per 24 hours - 8 pills 09/24/17   Kathie Rhodes, MD  irbesartan Levy Sjogren)  150 MG tablet Take 0.5 tablets (75 mg total) by mouth daily. 10/08/17   Mosie Lukes, MD  LORazepam (ATIVAN) 1 MG tablet TAKE ONE TABLET BY MOUTH EVERY 8 HOURS AS NEEDED FOR SLEEP OR ANXIETY Patient taking differently: 0.5 mg 2 (two) times daily.  08/27/17   Mosie Lukes, MD  mupirocin ointment (BACTROBAN) 2 % Apply to area twice daily. Patient taking differently: as needed. Apply to area twice daily. 05/17/17   Saguier, Percell Miller, PA-C  omeprazole (PRILOSEC) 20 MG capsule TAKE ONE CAPSULE BY MOUTH DAILY Patient taking differently: daily.  08/09/17   Mosie Lukes, MD  rivaroxaban (XARELTO) 20 MG TABS tablet Take 1 tablet (20 mg total) by mouth daily with supper. 09/01/17   Sherran Needs, NP  sertraline (ZOLOFT) 50 MG tablet Take 1 tablet (50 mg total) by mouth daily. Patient taking differently: Take 50 mg by mouth at bedtime.  09/17/17   Mosie Lukes, MD  tamsulosin (FLOMAX) 0.4 MG CAPS capsule Take 0.4 mg by mouth daily after supper.    [provider]    Family History Family History  Problem Relation Age of Onset  . Heart disease Mother   . Hypertension Mother   . Stroke Mother   . Colon cancer Mother   . Breast cancer Mother     . Heart disease Father        pacemaker  . Aortic aneurysm Father   . Hypertension Father   . Heart disease Sister   . Atrial fibrillation Sister   . Obesity Sister   . Sleep apnea Sister   . Heart attack Brother   . Other Brother        muscle disease  . Arthritis Brother   . Stroke Brother   . Atrial fibrillation Brother   . Cancer Maternal Grandmother        ?  Marland Kitchen Heart attack Maternal Grandmother   . Diabetes Maternal Grandmother   . Cancer Maternal Grandfather        hodgin's lymphoma  . Heart attack Paternal Grandmother   . Anxiety disorder Paternal Grandmother   . Pneumonia Paternal Grandfather   . Heart attack Brother   . Atrial fibrillation Brother   . Atrial fibrillation Brother   . Heart attack Brother   . Hypertension Brother   . Hyperlipidemia Brother   . Heart attack Brother   . Other Brother        heart valve operation  . Atrial fibrillation Brother     Social History Social History   Tobacco Use  . Smoking status: Former Smoker    Packs/day: 1.00    Years: 6.00    Pack years: 6.00    Types: Cigarettes    Start date: 01/12/1969  . Smokeless tobacco: Never Used  . Tobacco comment: [id number]  Substance Use Topics  . Alcohol use: Not Currently    Comment: 2 drinks 5 nights a week   . Drug use: No     Allergies   Cefaclor; Cephalosporins; and Penicillins   Review of Systems Review of Systems All other systems reviewed and are negative except that which was mentioned in HPI   Physical Exam Updated Vital Signs BP 123/78   Pulse 68   Temp 98.1 F (36.7 C)   Resp 16   Ht 6' (1.829 m)   Wt 107.5 kg   SpO2 96%   BMI 32.14 kg/m   Physical Exam  Constitutional: He is oriented to person,  place, and time. He appears well-developed and well-nourished. No distress.  HENT:  Head: Normocephalic and atraumatic.  Moist mucous membranes  Eyes: Conjunctivae are normal.  Neck: Neck supple.  Cardiovascular: Normal rate, regular rhythm and  normal heart sounds.  No murmur heard. Pulmonary/Chest: Effort normal and breath sounds normal.  Abdominal: Soft. Bowel sounds are normal. He exhibits no distension. There is no tenderness.  Musculoskeletal: He exhibits no edema.  Neurological: He is alert and oriented to person, place, and time.  Fluent speech  Skin: Skin is warm and dry.  Psychiatric: He has a normal mood and affect. Judgment normal.  Nursing note and vitals reviewed.    ED Treatments / Results  Labs (all labs ordered are listed, but only abnormal results are displayed) Labs Reviewed  URINALYSIS, ROUTINE W REFLEX MICROSCOPIC - Abnormal; Notable for the following components:      Result Value   Color, Urine BROWN (*)    APPearance CLOUDY (*)    Hgb urine dipstick LARGE (*)    Protein, ur 30 (*)    Leukocytes, UA TRACE (*)    All other components within normal limits  URINALYSIS, MICROSCOPIC (REFLEX) - Abnormal; Notable for the following components:   Bacteria, UA FEW (*)    All other components within normal limits    EKG None  Radiology No results found.  Procedures Procedures (including critical care time)  Medications Ordered in ED Medications - No data to display   Initial Impression / Assessment and Plan / ED Course  I have reviewed the triage vital signs and the nursing notes.  Pertinent labs that were available during my care of the patient were reviewed by me and considered in my medical decision making (see chart for details).     Appearing and pain-free on exam.  I reviewed his lab work from PCP office today, he has had a slight bump in his chronic kidney disease with creatinine of 2.1.  I have emphasized the importance of aggressive hydration over the next few days.  He is currently pain-free.  I suspect that he is having intermittent ureteral colic related to recent lithotripsy and stent removal.  His urine today contains blood which is not surprising given anticoagulant use, no evidence  of infection and his CBC today was reassuring.  I have discussed supportive measures, reviewed pain control at home, and reviewed return precautions.  He voiced understanding.  Final Clinical Impressions(s) / ED Diagnoses   Final diagnoses:  Ureteral colic    ED Discharge Orders    None       Carmellia Kreisler, Wenda Overland, MD 10/08/17 2131

## 2017-10-08 NOTE — Assessment & Plan Note (Signed)
Check uric acid level 

## 2017-10-08 NOTE — Assessment & Plan Note (Addendum)
Right sided and just went through lithotripsy. He is still struggling with discomfort and hematuria but both are improving. He will follow up with urology and hydrate well each day.follows with Dr Karsten Ro of [facility]

## 2017-10-09 LAB — URINE CULTURE
MICRO NUMBER:: 91164162
Result:: NO GROWTH
SPECIMEN QUALITY:: ADEQUATE

## 2017-10-10 NOTE — Assessment & Plan Note (Signed)
Encouraged DASH diet, decrease po intake and increase exercise as tolerated. Needs 7-8 hours of sleep nightly. Avoid trans fats, eat small, frequent meals every 4-5 hours with lean proteins, complex carbs and healthy fats. Minimize simple carbs, encouraged BMI between 23 and 27

## 2017-10-10 NOTE — Progress Notes (Signed)
Subjective:    Patient ID: David [name], male    DOB: [birth year], 72 y.o.   MRN: [id number]  No chief complaint on file.   HPI Patient is in today for follow up. He feels some better but still sore and struggling with hematuria after a kidney stone on the right required stenting and lithotripsy. No fevers or chills. He is following with D Ottelin of urology. He also notes persistent low back pain. No radicular symptoms. Denies CP/palp/SOB/HA/congestion/fevers/GI c/o. Taking meds as prescribed. He continues to struggle with his estranged relationship with his wife but he manages most days. Denies CP/palp/SOB/HA/congestion/fevers/GI or GU c/o. Taking meds as prescribed  Past Medical History:  Diagnosis Date  . Abnormal liver function 03/18/2013  . Adenoma of right adrenal gland 07/11/2002   2.4cm , noted on CT ABD  . Anxiety   . Arthritis of both knees 03/26/2016  . Astigmatism   . Atrial fibrillation ([place])    x2 successful cardioversions  . Back pain   . BCC (basal cell carcinoma of skin)    under right eye and right ear  . Cataract 09/29/2016  . Cataracts, bilateral 03/26/2016  . Depression   . Diverticulitis   . Diverticulosis   . Fatty liver   . GERD (gastroesophageal reflux disease)   . Gout   . Hearing loss of both ears 03/26/2016  . History of chronic prostatitis    started at age 19  . History of kidney stones   . Hyperglycemia 03/29/2016  . Hyperlipidemia   . Hypertension   . IBS (irritable bowel syndrome) 03/18/2013  . Incomplete right bundle branch block (RBBB)   . Internal hemorrhoids   . Kidney lesion 06/07/2015   Right midportion, 1.1x1.1 cm hyperechoic, noted on [place] ABD  . LAFB (left anterior fascicular block)   . Lipoma of axilla 09/2016  . Liver lesion, right lobe 06/07/2015   2.5x2.4x2.4 cm hypoechoic lesion posterior aspect, noted on [place] ABD  . Low back pain 03/26/2016  . LVH (left ventricular hypertrophy) 08/06/2017   Moderate, noted on ECHO  . Medicare  annual wellness visit, subsequent 03/12/2014  . Muscle cramps   . Nocturia   . OA (osteoarthritis)    Back, Hands, Neck  . Obesity 11/25/2007   Qualifier: Diagnosis of  By: Lenna Gilford MD, Deborra Medina   . Other malaise and fatigue 03/13/2013  . Premature ventricular complex   . Preventative health care 03/07/2015  . Rectal irritation 09/18/2012  . Renal insufficiency   . Scoliosis    Upper thoracic and lumbar  . Tinea corporis 03/13/2013  . Unspecified constipation 09/25/2013  . Vitamin D deficiency    resolved    Past Surgical History:  Procedure Laterality Date  . CARDIOVERSION  07/31/2017  . CATARACT EXTRACTION, BILATERAL    . COLON SURGERY  2009   segmental sigmoid resection  . COLONOSCOPY    . HOLMIUM LASER APPLICATION Right 04/20/1446   Procedure: HOLMIUM LASER APPLICATION;  Surgeon: Kathie Rhodes, MD;  Location: [facility];  Service: Urology;  Laterality: Right;  . NOSE SURGERY     Submucous resection age 36  . NUCLEAR STRESS TEST  06/03/2009  . SKIN BIOPSY    . URETEROSCOPY WITH HOLMIUM LASER LITHOTRIPSY Right 09/24/2017   Procedure: CYSTOSCOPY, URETEROSCOPY WITH HOLMIUM LASER LITHOTRIPSY, STENT PLACEMENT;  Surgeon: Kathie Rhodes, MD;  Location: [facility];  Service: Urology;  Laterality: Right;    Family History  Problem Relation Age of Onset  .  Heart disease Mother   . Hypertension Mother   . Stroke Mother   . Colon cancer Mother   . Breast cancer Mother   . Heart disease Father        pacemaker  . Aortic aneurysm Father   . Hypertension Father   . Heart disease Sister   . Atrial fibrillation Sister   . Obesity Sister   . Sleep apnea Sister   . Heart attack Brother   . Other Brother        muscle disease  . Arthritis Brother   . Stroke Brother   . Atrial fibrillation Brother   . Cancer Maternal Grandmother        ?  Marland Kitchen Heart attack Maternal Grandmother   . Diabetes Maternal Grandmother   . Cancer Maternal Grandfather        hodgin's  lymphoma  . Heart attack Paternal Grandmother   . Anxiety disorder Paternal Grandmother   . Pneumonia Paternal Grandfather   . Heart attack Brother   . Atrial fibrillation Brother   . Atrial fibrillation Brother   . Heart attack Brother   . Hypertension Brother   . Hyperlipidemia Brother   . Heart attack Brother   . Other Brother        heart valve operation  . Atrial fibrillation Brother     Social History   Socioeconomic History  . Marital status: Married    Spouse name: Not on file  . Number of children: 3  . Years of education: Not on file  . Highest education level: Not on file  Occupational History  . Occupation: Nurse, adult: [employer]  . Financial resource strain: Not on file  . Food insecurity:    Worry: Not on file    Inability: Not on file  . Transportation needs:    Medical: Not on file    Non-medical: Not on file  Tobacco Use  . Smoking status: Former Smoker    Packs/day: 1.00    Years: 6.00    Pack years: 6.00    Types: Cigarettes    Start date: 01/12/1969  . Smokeless tobacco: Never Used  . Tobacco comment: [id number]  Substance and Sexual Activity  . Alcohol use: Not Currently    Comment: 2 drinks 5 nights a week   . Drug use: No  . Sexual activity: Yes  Lifestyle  . Physical activity:    Days per week: Not on file    Minutes per session: Not on file  . Stress: Not on file  Relationships  . Social connections:    Talks on phone: Not on file    Gets together: Not on file    Attends religious service: Not on file    Active member of club or organization: Not on file    Attends meetings of clubs or organizations: Not on file    Relationship status: Not on file  . Intimate partner violence:    Fear of current or ex partner: Not on file    Emotionally abused: Not on file    Physically abused: Not on file    Forced sexual activity: Not on file  Other Topics Concern  . Not on file  Social History Narrative   The patient is  married for the second time, he has 3 sons.   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   2 alcoholic beverages most days.   1 caffeinated beverage daily   No  drug use no current tobacco use he is a prior smoker   12/24/2016    Outpatient Medications Prior to Visit  Medication Sig Dispense Refill  . acetaminophen (TYLENOL) 325 MG tablet Take 650 mg by mouth every 6 (six) hours as needed.    Marland Kitchen alum hydroxide-mag trisilicate (GAVISCON) 02-58 MG CHEW Chew by mouth as needed.     Marland Kitchen atenolol (TENORMIN) 25 MG tablet TAKE ONE TABLET BY MOUTH TWICE A DAY (Patient taking differently: 25 mg. ) 180 tablet 2  . Colchicine 0.6 MG CAPS 1 tab po bid (Patient taking differently: as needed. 1 tab po bid) 60 capsule 0  . cyclobenzaprine (FLEXERIL) 5 MG tablet Take 1 tablet (5 mg total) by mouth 3 (three) times daily as needed for muscle spasms. (Patient taking differently: Take 5 mg by mouth as needed for muscle spasms. ) 30 tablet 1  . fluticasone (FLONASE) 50 MCG/ACT nasal spray Place 2 sprays into the nose daily as needed. For congestion    . HYDROcodone-acetaminophen (NORCO) 10-325 MG tablet Take 1-2 tablets by mouth every 4 (four) hours as needed for moderate pain. Maximum dose per 24 hours - 8 pills 20 tablet 0  . LORazepam (ATIVAN) 1 MG tablet TAKE ONE TABLET BY MOUTH EVERY 8 HOURS AS NEEDED FOR SLEEP OR ANXIETY (Patient taking differently: 0.5 mg 2 (two) times daily. ) 90 tablet 0  . mupirocin ointment (BACTROBAN) 2 % Apply to area twice daily. (Patient taking differently: as needed. Apply to area twice daily.) 22 g 0  . omeprazole (PRILOSEC) 20 MG capsule TAKE ONE CAPSULE BY MOUTH DAILY (Patient taking differently: daily. ) 30 capsule 4  . rivaroxaban (XARELTO) 20 MG TABS tablet Take 1 tablet (20 mg total) by mouth daily with supper. 30 tablet 3  . sertraline (ZOLOFT) 50 MG tablet Take 1 tablet (50 mg total) by mouth daily. (Patient taking differently: Take 50 mg by mouth at bedtime. ) 30 tablet 3  .  tamsulosin (FLOMAX) 0.4 MG CAPS capsule Take 0.4 mg by mouth daily after supper.    . irbesartan (AVAPRO) 150 MG tablet Take 1 tablet (150 mg total) by mouth daily. 90 tablet 1  . methylPREDNISolone acetate (DEPO-MEDROL) 80 MG/ML injection Inject 80 mg into the muscle once.    . phenazopyridine (PYRIDIUM) 200 MG tablet Take 1 tablet (200 mg total) by mouth 3 (three) times daily as needed for pain. 20 tablet 0  . Polyvinyl Alcohol-Povidone PF (REFRESH) 1.4-0.6 % SOLN Apply to eye as needed.    . sulfamethoxazole-trimethoprim (BACTRIM DS,SEPTRA DS) 800-160 MG tablet Take 1 tablet by mouth 2 (two) times daily for 7 days. 14 tablet 0   No facility-administered medications prior to visit.     Allergies  Allergen Reactions  . Cefaclor Hives  . Cephalosporins Hives  . Penicillins Rash    Mild maculopapular rash    Review of Systems  Constitutional: Positive for malaise/fatigue. Negative for fever.  HENT: Negative for congestion.   Eyes: Negative for blurred vision.  Respiratory: Negative for shortness of breath.   Cardiovascular: Negative for chest pain, palpitations and leg swelling.  Gastrointestinal: Negative for abdominal pain, blood in stool and nausea.  Genitourinary: Positive for flank pain, frequency, hematuria and urgency. Negative for dysuria.  Musculoskeletal: Negative for back pain and falls.  Skin: Negative for rash.  Neurological: Negative for dizziness, loss of consciousness and headaches.  Endo/Heme/Allergies: Negative for environmental allergies.  Psychiatric/Behavioral: Negative for depression. The patient is not nervous/anxious.  Objective:    Physical Exam  Constitutional: He is oriented to person, place, and time. He appears well-developed and well-nourished. No distress.  HENT:  Head: Normocephalic and atraumatic.  Nose: Nose normal.  Eyes: Right eye exhibits no discharge. Left eye exhibits no discharge.  Neck: Normal range of motion. Neck supple.    Cardiovascular: Normal rate and regular rhythm.  No murmur heard. Pulmonary/Chest: Effort normal and breath sounds normal.  Abdominal: Soft. Bowel sounds are normal. There is no tenderness.  Musculoskeletal: He exhibits no edema.  Neurological: He is alert and oriented to person, place, and time.  Skin: Skin is warm and dry.  Psychiatric: He has a normal mood and affect.  Nursing note and vitals reviewed.   BP (!) 108/59 (BP Location: Left Arm, Patient Position: Sitting, Cuff Size: Large)   Pulse 61   Temp 97.9 F (36.6 C) (Oral)   Resp 18   Wt 237 lb 9.6 oz (107.8 kg)   SpO2 96%   BMI 32.22 kg/m  Wt Readings from Last 3 Encounters:  10/08/17 237 lb (107.5 kg)  10/08/17 237 lb 9.6 oz (107.8 kg)  10/03/17 243 lb (110.2 kg)     Lab Results  Component Value Date   WBC 7.5 10/08/2017   HGB 14.1 10/08/2017   HCT 41.8 10/08/2017   PLT 252.0 10/08/2017   GLUCOSE 97 10/08/2017   CHOL 202 (H) 10/08/2017   TRIG 94.0 10/08/2017   HDL 33.00 (L) 10/08/2017   LDLDIRECT 168.5 03/29/2006   LDLCALC 151 (H) 10/08/2017   ALT 24 10/08/2017   AST 21 10/08/2017   NA 135 10/08/2017   K 4.7 10/08/2017   CL 103 10/08/2017   CREATININE 2.11 (H) 10/08/2017   BUN 30 (H) 10/08/2017   CO2 26 10/08/2017   TSH 3.05 10/08/2017   PSA 3.16 03/30/2017   INR 1.39 11/14/2010   HGBA1C 5.7 10/08/2017    Lab Results  Component Value Date   TSH 3.05 10/08/2017   Lab Results  Component Value Date   WBC 7.5 10/08/2017   HGB 14.1 10/08/2017   HCT 41.8 10/08/2017   MCV 91.5 10/08/2017   PLT 252.0 10/08/2017   Lab Results  Component Value Date   NA 135 10/08/2017   K 4.7 10/08/2017   CO2 26 10/08/2017   GLUCOSE 97 10/08/2017   BUN 30 (H) 10/08/2017   CREATININE 2.11 (H) 10/08/2017   BILITOT 0.8 10/08/2017   ALKPHOS 73 10/08/2017   AST 21 10/08/2017   ALT 24 10/08/2017   PROT 6.9 10/08/2017   ALBUMIN 4.2 10/08/2017   CALCIUM 10.5 10/08/2017   ANIONGAP 8 10/03/2017   GFR 32.94 (L)  10/08/2017   Lab Results  Component Value Date   CHOL 202 (H) 10/08/2017   Lab Results  Component Value Date   HDL 33.00 (L) 10/08/2017   Lab Results  Component Value Date   LDLCALC 151 (H) 10/08/2017   Lab Results  Component Value Date   TRIG 94.0 10/08/2017   Lab Results  Component Value Date   CHOLHDL 6 10/08/2017   Lab Results  Component Value Date   HGBA1C 5.7 10/08/2017       Assessment & Plan:   Problem List Items Addressed This Visit    Obesity    Encouraged DASH diet, decrease po intake and increase exercise as tolerated. Needs 7-8 hours of sleep nightly. Avoid trans fats, eat small, frequent meals every 4-5 hours with lean proteins, complex carbs and healthy fats.  Minimize simple carbs, encouraged BMI between 23 and 27      Hyperlipidemia, mixed    Encouraged heart healthy diet, increase exercise, avoid trans fats, consider a krill oil cap daily      Relevant Medications   irbesartan (AVAPRO) 150 MG tablet   Other Relevant Orders   Lipid panel (Completed)   Essential hypertension, benign    Well controlled, will drop his Avapro to 75 mg daily due to recent low numbers and light headedness. Encouraged heart healthy diet such as the DASH diet and exercise as tolerated.       Relevant Medications   irbesartan (AVAPRO) 150 MG tablet   Other Relevant Orders   CBC with Differential/Platelet (Completed)   Comprehensive metabolic panel (Completed)   TSH (Completed)   Atrial fibrillation (HCC)    Sees Dr Percival Spanish on Monday and is doing well.       Relevant Medications   irbesartan (AVAPRO) 150 MG tablet   Renal insufficiency    Hydrate and monitor, worse after recent stones and lithotripsy. Will continue to monitor      RESOLVED: Vitamin D deficiency    Supplement and monitor      Gouty arthritis of toe of left foot    Check uric acid level      Relevant Orders   Uric acid (Completed)   Depression with anxiety    Taking Sertraline but is  frustrated the side effects.       Hyperglycemia    hgba1c acceptable, minimize simple carbs. Increase exercise as tolerated.       Relevant Orders   Hemoglobin A1c (Completed)   Kidney stone    Right sided and just went through lithotripsy. He is still struggling with discomfort and hematuria but both are improving. He will follow up with urology and hydrate well each day.follows with Dr Karsten Ro of [facility]       Other Visit Diagnoses    Hematuria, unspecified type    -  Primary   Relevant Orders   Urinalysis   Urine Culture (Completed)      I have discontinued David [name] "Fred"'s Polyvinyl Alcohol-Povidone PF, methylPREDNISolone acetate, phenazopyridine, and sulfamethoxazole-trimethoprim. I have also changed his irbesartan. Additionally, I am having him maintain his fluticasone, alum hydroxide-mag trisilicate, mupirocin ointment, cyclobenzaprine, omeprazole, LORazepam, rivaroxaban, atenolol, Colchicine, sertraline, acetaminophen, tamsulosin, and HYDROcodone-acetaminophen.  Meds ordered this encounter  Medications  . irbesartan (AVAPRO) 150 MG tablet    Sig: Take 0.5 tablets (75 mg total) by mouth daily.    Dispense:  90 tablet    Refill:  1     Penni Homans, MD

## 2017-10-11 ENCOUNTER — Encounter: Payer: Self-pay | Admitting: Cardiology

## 2017-10-11 NOTE — Progress Notes (Signed)
Cardiology Office Note   Date:  10/12/2017   ID:  David [name], DOB 04/08/1945, MRN [id number]  PCP:  Mosie Lukes, MD  Cardiologist:   Minus Breeding, MD   No chief complaint on file.     History of Present Illness: David [name] is a 72 y.o. male who presents for follow up of atrial fib.  He has been seen in the Atrial Fib clinic.  He was in the clinic for atrial fib with RVR.  He was treated in the ED with cardioversion and was started on Xarelto.   Of note he had an echo with preserved systolic ejection fraction but with moderate left ventricular hypertrophy.  He says that at that time he was drinking more caffeine.  He has not had any further tachypalpitations.  He has had problems with hematuria and has been on and off Xarelto because of this.  This all stems from having treatment of nephrolithiasis with ureteral stenting.  He is had a lot of pain associated with this.  He denies any cardiovascular symptoms however. The patient denies any new symptoms such as chest discomfort, neck or arm discomfort. There has been no new shortness of breath, PND or orthopnea. There have been no reported palpitations, presyncope or syncope.  Prior to this he was doing some activities of stairs without problems.   Past Medical History:  Diagnosis Date  . Abnormal liver function 03/18/2013  . Adenoma of right adrenal gland 07/11/2002   2.4cm , noted on CT ABD  . Anxiety   . Arthritis of both knees 03/26/2016  . Astigmatism   . Atrial fibrillation ([place])    x1 successful cardioversions  . Back pain   . BCC (basal cell carcinoma of skin)    under right eye and right ear  . Cataract 09/29/2016  . Depression   . Diverticulitis   . Diverticulosis   . Fatty liver   . GERD (gastroesophageal reflux disease)   . Gout   . Hearing loss of both ears 03/26/2016  . History of chronic prostatitis    started at age 65  . History of kidney stones   . Hyperglycemia 03/29/2016  . Hyperlipidemia     . Hypertension   . IBS (irritable bowel syndrome) 03/18/2013  . Incomplete right bundle branch block (RBBB)   . Internal hemorrhoids   . Kidney lesion 06/07/2015   Right midportion, 1.1x1.1 cm hyperechoic, noted on [place] ABD  . LAFB (left anterior fascicular block)   . Lipoma of axilla 09/2016  . Liver lesion, right lobe 06/07/2015   2.5x2.4x2.4 cm hypoechoic lesion posterior aspect, noted on [place] ABD  . Low back pain 03/26/2016  . LVH (left ventricular hypertrophy) 08/06/2017   Moderate, noted on ECHO  . Medicare annual wellness visit, subsequent 03/12/2014  . Muscle cramps   . Nocturia   . OA (osteoarthritis)    Back, Hands, Neck  . Obesity 11/25/2007   Qualifier: Diagnosis of  By: Lenna Gilford MD, Deborra Medina   . Other malaise and fatigue 03/13/2013  . Premature ventricular complex   . Preventative health care 03/07/2015  . Renal insufficiency   . Scoliosis    Upper thoracic and lumbar  . Vitamin D deficiency    resolved    Past Surgical History:  Procedure Laterality Date  . CARDIOVERSION  07/31/2017  . CATARACT EXTRACTION, BILATERAL    . COLON SURGERY  2009   segmental sigmoid resection  . COLONOSCOPY    .  HOLMIUM LASER APPLICATION Right 4/40/1027   Procedure: HOLMIUM LASER APPLICATION;  Surgeon: Kathie Rhodes, MD;  Location: [facility];  Service: Urology;  Laterality: Right;  . NOSE SURGERY     Submucous resection age 54  . NUCLEAR STRESS TEST  06/03/2009  . SKIN BIOPSY    . URETEROSCOPY WITH HOLMIUM LASER LITHOTRIPSY Right 09/24/2017   Procedure: CYSTOSCOPY, URETEROSCOPY WITH HOLMIUM LASER LITHOTRIPSY, STENT PLACEMENT;  Surgeon: Kathie Rhodes, MD;  Location: [facility];  Service: Urology;  Laterality: Right;     Current Outpatient Medications  Medication Sig Dispense Refill  . acetaminophen (TYLENOL) 325 MG tablet Take 650 mg by mouth every 6 (six) hours as needed.    Marland Kitchen alum hydroxide-mag trisilicate (GAVISCON) 25-36 MG CHEW Chew by mouth as needed.      Marland Kitchen atenolol (TENORMIN) 25 MG tablet TAKE ONE TABLET BY MOUTH TWICE A DAY (Patient taking differently: 25 mg. ) 180 tablet 2  . Colchicine 0.6 MG CAPS 1 tab po bid (Patient taking differently: as needed. 1 tab po bid) 60 capsule 0  . cyclobenzaprine (FLEXERIL) 5 MG tablet Take 1 tablet (5 mg total) by mouth 3 (three) times daily as needed for muscle spasms. (Patient taking differently: Take 5 mg by mouth as needed for muscle spasms. ) 30 tablet 1  . fluticasone (FLONASE) 50 MCG/ACT nasal spray Place 2 sprays into the nose daily as needed. For congestion    . HYDROcodone-acetaminophen (NORCO) 10-325 MG tablet Take 1-2 tablets by mouth every 4 (four) hours as needed for moderate pain. Maximum dose per 24 hours - 8 pills 20 tablet 0  . irbesartan (AVAPRO) 150 MG tablet Take 0.5 tablets (75 mg total) by mouth daily. 90 tablet 1  . LORazepam (ATIVAN) 1 MG tablet TAKE ONE TABLET BY MOUTH EVERY 8 HOURS AS NEEDED FOR SLEEP OR ANXIETY (Patient taking differently: 0.5 mg 2 (two) times daily. ) 90 tablet 0  . mupirocin ointment (BACTROBAN) 2 % Apply to area twice daily. (Patient taking differently: as needed. Apply to area twice daily.) 22 g 0  . omeprazole (PRILOSEC) 20 MG capsule TAKE ONE CAPSULE BY MOUTH DAILY (Patient taking differently: daily. ) 30 capsule 4  . rivaroxaban (XARELTO) 20 MG TABS tablet Take 1 tablet (20 mg total) by mouth daily with supper. 30 tablet 3  . tamsulosin (FLOMAX) 0.4 MG CAPS capsule Take 0.4 mg by mouth daily after supper.    . sertraline (ZOLOFT) 50 MG tablet Take 1 tablet (50 mg total) by mouth daily. (Patient not taking: Reported on 10/12/2017) 30 tablet 3   No current facility-administered medications for this visit.     Allergies:   Cefaclor; Cephalosporins; and Penicillins    ROS:  Please see the history of present illness.   Otherwise, review of systems are positive for none.   All other systems are reviewed and negative.    PHYSICAL EXAM: VS:  BP 137/78 (BP  Location: Left Arm, Patient Position: Sitting, Cuff Size: Normal)   Pulse 62   Ht 6' (1.829 m)   Wt 237 lb 12.8 oz (107.9 kg)   BMI 32.25 kg/m  , BMI Body mass index is 32.25 kg/m. GENERAL:  Well appearing NECK:  No jugular venous distention, waveform within normal limits, carotid upstroke brisk and symmetric, no bruits, no thyromegaly LUNGS:  Clear to auscultation bilaterally BACK:  No CVA tenderness CHEST:  Unremarkable HEART:  PMI not displaced or sustained,S1 and S2 within normal limits, no S3, no  S4, no clicks, no rubs, no murmurs ABD:  Flat, positive bowel sounds normal in frequency in pitch, no bruits, no rebound, no guarding, no midline pulsatile mass, no hepatomegaly, no splenomegaly EXT:  2 plus pulses throughout, no edema, no cyanosis no clubbing    EKG:  EKG is not ordered today. The ekg ordered 08/06/17 demonstrates sinus rhythm, rate 59, left axis deviation, left anterior fascicular block, poor anterior R wave progression, no acute ST-T wave changes.   Recent Labs: 07/31/2017: Magnesium 2.2 10/08/2017: ALT 24; BUN 30; Creatinine, Ser 2.11; Hemoglobin 14.1; Platelets 252.0; Potassium 4.7; Sodium 135; TSH 3.05    Lipid Panel    Component Value Date/Time   CHOL 202 (H) 10/08/2017 1020   TRIG 94.0 10/08/2017 1020   HDL 33.00 (L) 10/08/2017 1020   CHOLHDL 6 10/08/2017 1020   VLDL 18.8 10/08/2017 1020   LDLCALC 151 (H) 10/08/2017 1020   LDLDIRECT 168.5 03/29/2006 1007      Wt Readings from Last 3 Encounters:  10/12/17 237 lb 12.8 oz (107.9 kg)  10/08/17 237 lb (107.5 kg)  10/08/17 237 lb 9.6 oz (107.8 kg)      Other studies Reviewed: Additional studies/ records that were reviewed today include: ED records and [facility] records. Review of the above records demonstrates:  Please see elsewhere in the note.     ASSESSMENT AND PLAN:  ATRIAL FIB:   The patient has paroxysmal atrial fibrillation.  [name] has a CHA2DS2 - VASc score of 2.  He will  need long-term Xarelto.  However, I think with his ongoing hematuria and the fact that he is stopping and starting this medication it would be better to just hold off on this until he is cleared by urology.  He will let me know if he has any increasing paroxysms at which point we might need to alter management.  For now he is only had 2 episodes in a few years.   LVH:   This was moderate on echo and I reviewed this with him.  He will control his blood pressure and I will follow this going forward.  HTN: Blood pressure is slightly elevated today.  He will keep a blood pressure diary.  Current medicines are reviewed at length with the patient today.  The patient does not have concerns regarding medicines.  The following changes have been made:  no change  Labs/ tests ordered today include: None No orders of the defined types were placed in this encounter.    Disposition:   FU with me in six months.     Signed, Minus Breeding, MD  10/12/2017 5:01 PM    [facility]

## 2017-10-12 ENCOUNTER — Ambulatory Visit: Payer: Medicare Other | Admitting: Cardiology

## 2017-10-12 ENCOUNTER — Encounter: Payer: Self-pay | Admitting: Cardiology

## 2017-10-12 ENCOUNTER — Ambulatory Visit: Payer: Self-pay

## 2017-10-12 VITALS — BP 137/78 | HR 62 | Ht 72.0 in | Wt 237.8 lb

## 2017-10-12 DIAGNOSIS — I10 Essential (primary) hypertension: Secondary | ICD-10-CM | POA: Diagnosis not present

## 2017-10-12 DIAGNOSIS — I480 Paroxysmal atrial fibrillation: Secondary | ICD-10-CM

## 2017-10-12 DIAGNOSIS — I517 Cardiomegaly: Secondary | ICD-10-CM

## 2017-10-12 MED ORDER — RIVAROXABAN 20 MG PO TABS: 20.0000 mg | ORAL_TABLET | Freq: Every day | ORAL | 11 refills | Status: DC

## 2017-10-12 NOTE — Patient Instructions (Signed)

## 2017-10-12 NOTE — Telephone Encounter (Signed)
Received call from patient that is experiencing intermittent rt abdominal pain. He reports a hx of kidney stone 53mm with surgery for this on the 09/20/17 Stent was placed and removed on  09/24/17  Since this time he has had severe intermittent pain. He states that he had been to ED for this issue. He has been to his urologist. He states that when the pain occurs he is unable to urinate. He treats this by drinking water that finally allows him to urinate. He feel that he is voiding adequate amounts when this occurs. He reports blood is sometimes visible but no clots. He says that constipation is a usual problem which he is able to treat with success. He denies chills and fever. Appointment made for office visit Thursday. Pt understands that any change he should go to urgent care. Pt encouraged to see his urologist again as well.  Care advice given Pt verbalized understanding of all instructions  Reason for Disposition . Blood in urine (red, pink, or tea-colored)  Answer Assessment - Initial Assessment Questions 1. LOCATION: "Where does it hurt?"      Rt upper abdomine 2. RADIATION: "Does the pain shoot anywhere else?" (e.g., chest, back)     Rt side 3. ONSET: "When did the pain begin?" (Minutes, hours or days ago)      Days ago when stent removed 4. SUDDEN: "Gradual or sudden onset?"     Sudden  After stent 5. PATTERN "Does the pain come and go, or is it constant?"    - If constant: "Is it getting better, staying the same, or worsening?"      (Note: Constant means the pain never goes away completely; most serious pain is constant and it progresses)     - If intermittent: "How long does it last?" "Do you have pain now?"     (Note: Intermittent means the pain goes away completely between bouts)     Comes and goes 6. SEVERITY: "How bad is the pain?"  (e.g., Scale 1-10; mild, moderate, or severe)    - MILD (1-3): doesn't interfere with normal activities, abdomen soft and not tender to touch     -  MODERATE (4-7): interferes with normal activities or awakens from sleep, tender to touch     - SEVERE (8-10): excruciating pain, doubled over, unable to do any normal activities       Severe 8 7. RECURRENT SYMPTOM: "Have you ever had this type of abdominal pain before?" If so, ask: "When was the last time?" and "What happened that time?"      reoccurring since stent removal 8. CAUSE: "What do you think is causing the abdominal pain?"     stone fragments 9. RELIEVING/AGGRAVATING FACTORS: "What makes it better or worse?" (e.g., movement, antacids, bowel movement)     Drinking liquid and then urinating relieves 10. OTHER SYMPTOMS: "Has there been any vomiting, diarrhea, constipation, or urine problems?"       Constipation urine red  Protocols used: ABDOMINAL PAIN - MALE-A-AH

## 2017-10-14 ENCOUNTER — Encounter: Payer: Self-pay | Admitting: Internal Medicine

## 2017-10-14 ENCOUNTER — Ambulatory Visit: Payer: Medicare Other | Admitting: Internal Medicine

## 2017-10-14 VITALS — BP 138/68 | HR 69 | Temp 97.9°F | Resp 16 | Ht 72.0 in | Wt 236.4 lb

## 2017-10-14 DIAGNOSIS — I480 Paroxysmal atrial fibrillation: Secondary | ICD-10-CM | POA: Diagnosis not present

## 2017-10-14 DIAGNOSIS — N201 Calculus of ureter: Secondary | ICD-10-CM | POA: Diagnosis not present

## 2017-10-14 DIAGNOSIS — N200 Calculus of kidney: Secondary | ICD-10-CM

## 2017-10-14 LAB — BASIC METABOLIC PANEL
BUN: 18 mg/dL (ref 6–23)
CALCIUM: 10.4 mg/dL (ref 8.4–10.5)
CO2: 27 meq/L (ref 19–32)
CREATININE: 1.57 mg/dL — AB (ref 0.40–1.50)
Chloride: 102 mEq/L (ref 96–112)
GFR: 46.33 mL/min — ABNORMAL LOW (ref >60.00)
Glucose, Bld: 95 mg/dL (ref 70–99)
Potassium: 4.5 mEq/L (ref 3.5–5.1)
Sodium: 135 mEq/L (ref 135–145)

## 2017-10-14 NOTE — Progress Notes (Signed)
Subjective:    Patient ID: David [name], male    DOB: 07-29-1945, 72 y.o.   MRN: [id number]  DOS:  10/14/2017 Type of visit - description : ED f/u Interval history: Has a h/o  Lithotripsy ~ 09/20/17 at night ureteral stent placed. Stent was removed ~ 09/30/17. Was seen at the ER 9/22 with pain and prescribed Bactrim for possible UTI. Went to the ER again 10/08/2017. Had a sudden onset of sharp pain on the right side, could not void, by the time he arrived to the ER he was pain-free.  Urinalysis show cloudy, brown urine with blood.  He was felt to have intermittent ureteral colic due to recent lithotripsy and discharged home.  Subsequently saw cardiology 10/12/2017, given problems with the stones and hematuria was recommended to hold Xarelto for now.  Wt Readings from Last 3 Encounters:  10/14/17 236 lb 6 oz (107.2 kg)  10/12/17 237 lb 12.8 oz (107.9 kg)  10/08/17 237 lb (107.5 kg)   Review of Systems He was feeling poorly until 3 days ago when rather suddenly, the right-sided abdominal pain decreased substantially. Also he reports no further microscopic hematuria. For the last few weeks, he has been pain, has not if his appetite is decreased and he has been feeling tired however in the last 24 hours he is feeling better. Denies any fever, chills. No nausea or vomiting  Past Medical History:  Diagnosis Date  . Abnormal liver function 03/18/2013  . Adenoma of right adrenal gland 07/11/2002   2.4cm , noted on CT ABD  . Anxiety   . Arthritis of both knees 03/26/2016  . Astigmatism   . Atrial fibrillation ([place])    x1 successful cardioversions  . Back pain   . BCC (basal cell carcinoma of skin)    under right eye and right ear  . Cataract 09/29/2016  . Depression   . Diverticulitis   . Diverticulosis   . Fatty liver   . GERD (gastroesophageal reflux disease)   . Gout   . Hearing loss of both ears 03/26/2016  . History of chronic prostatitis    started at age 36  . History of  kidney stones   . Hyperglycemia 03/29/2016  . Hyperlipidemia   . Hypertension   . IBS (irritable bowel syndrome) 03/18/2013  . Incomplete right bundle branch block (RBBB)   . Internal hemorrhoids   . Kidney lesion 06/07/2015   Right midportion, 1.1x1.1 cm hyperechoic, noted on [place] ABD  . LAFB (left anterior fascicular block)   . Lipoma of axilla 09/2016  . Liver lesion, right lobe 06/07/2015   2.5x2.4x2.4 cm hypoechoic lesion posterior aspect, noted on [place] ABD  . Low back pain 03/26/2016  . LVH (left ventricular hypertrophy) 08/06/2017   Moderate, noted on ECHO  . Medicare annual wellness visit, subsequent 03/12/2014  . Muscle cramps   . Nocturia   . OA (osteoarthritis)    Back, Hands, Neck  . Obesity 11/25/2007   Qualifier: Diagnosis of  By: Lenna Gilford MD, Deborra Medina   . Other malaise and fatigue 03/13/2013  . Premature ventricular complex   . Preventative health care 03/07/2015  . Renal insufficiency   . Scoliosis    Upper thoracic and lumbar  . Vitamin D deficiency    resolved    Past Surgical History:  Procedure Laterality Date  . CARDIOVERSION  07/31/2017  . CATARACT EXTRACTION, BILATERAL    . COLON SURGERY  2009   segmental sigmoid resection  . COLONOSCOPY    .  HOLMIUM LASER APPLICATION Right 6/73/4193   Procedure: HOLMIUM LASER APPLICATION;  Surgeon: Kathie Rhodes, MD;  Location: [facility];  Service: Urology;  Laterality: Right;  . NOSE SURGERY     Submucous resection age 33  . NUCLEAR STRESS TEST  06/03/2009  . SKIN BIOPSY    . URETEROSCOPY WITH HOLMIUM LASER LITHOTRIPSY Right 09/24/2017   Procedure: CYSTOSCOPY, URETEROSCOPY WITH HOLMIUM LASER LITHOTRIPSY, STENT PLACEMENT;  Surgeon: Kathie Rhodes, MD;  Location: [facility];  Service: Urology;  Laterality: Right;    Social History   Socioeconomic History  . Marital status: Married    Spouse name: Not on file  . Number of children: 3  . Years of education: Not on file  . Highest education  level: Not on file  Occupational History  . Occupation: Nurse, adult: [employer]  . Financial resource strain: Not on file  . Food insecurity:    Worry: Not on file    Inability: Not on file  . Transportation needs:    Medical: Not on file    Non-medical: Not on file  Tobacco Use  . Smoking status: Former Smoker    Packs/day: 1.00    Years: 6.00    Pack years: 6.00    Types: Cigarettes    Start date: 01/12/1969  . Smokeless tobacco: Never Used  . Tobacco comment: [id number]  Substance and Sexual Activity  . Alcohol use: Not Currently    Comment: 2 drinks 5 nights a week   . Drug use: No  . Sexual activity: Yes  Lifestyle  . Physical activity:    Days per week: Not on file    Minutes per session: Not on file  . Stress: Not on file  Relationships  . Social connections:    Talks on phone: Not on file    Gets together: Not on file    Attends religious service: Not on file    Active member of club or organization: Not on file    Attends meetings of clubs or organizations: Not on file    Relationship status: Not on file  . Intimate partner violence:    Fear of current or ex partner: Not on file    Emotionally abused: Not on file    Physically abused: Not on file    Forced sexual activity: Not on file  Other Topics Concern  . Not on file  Social History Narrative   The patient is married for the second time, he has 3 sons.   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   2 alcoholic beverages most days.   1 caffeinated beverage daily   No drug use no current tobacco use he is a prior smoker   12/24/2016      Allergies as of 10/14/2017      Reactions   Cefaclor Hives   Cephalosporins Hives   Penicillins Rash   Mild maculopapular rash      Medication List        Accurate as of 10/14/17 10:24 AM. Always use your most recent med list.          acetaminophen 325 MG tablet Commonly known as:  TYLENOL Take 650 mg by mouth every 6 (six) hours as needed.    alum hydroxide-mag trisilicate 35-32 MG Chew chewable tablet Commonly known as:  GAVISCON Chew by mouth as needed.   atenolol 25 MG tablet Commonly known as:  TENORMIN TAKE ONE TABLET BY MOUTH  TWICE A DAY   Colchicine 0.6 MG Caps 1 tab po bid   cyclobenzaprine 5 MG tablet Commonly known as:  FLEXERIL Take 1 tablet (5 mg total) by mouth 3 (three) times daily as needed for muscle spasms.   fluticasone 50 MCG/ACT nasal spray Commonly known as:  FLONASE Place 2 sprays into the nose daily as needed. For congestion   HYDROcodone-acetaminophen 10-325 MG tablet Commonly known as:  NORCO Take 1-2 tablets by mouth every 4 (four) hours as needed for moderate pain. Maximum dose per 24 hours - 8 pills   irbesartan 150 MG tablet Commonly known as:  AVAPRO Take 0.5 tablets (75 mg total) by mouth daily.   LORazepam 1 MG tablet Commonly known as:  ATIVAN TAKE ONE TABLET BY MOUTH EVERY 8 HOURS AS NEEDED FOR SLEEP OR ANXIETY   mupirocin ointment 2 % Commonly known as:  BACTROBAN Apply to area twice daily.   omeprazole 20 MG capsule Commonly known as:  PRILOSEC TAKE ONE CAPSULE BY MOUTH DAILY   rivaroxaban 20 MG Tabs tablet Commonly known as:  XARELTO Take 1 tablet (20 mg total) by mouth daily with supper.   sertraline 50 MG tablet Commonly known as:  ZOLOFT Take 1 tablet (50 mg total) by mouth daily.   tamsulosin 0.4 MG Caps capsule Commonly known as:  FLOMAX Take 0.4 mg by mouth daily after supper.          Objective:   Physical Exam BP 138/68 (BP Location: Left Arm, Patient Position: Sitting, Cuff Size: Normal)   Pulse 69   Temp 97.9 F (36.6 C) (Oral)   Resp 16   Ht 6' (1.829 m)   Wt 236 lb 6 oz (107.2 kg)   SpO2 98%   BMI 32.06 kg/m   General:   Well developed, NAD, see BMI.  HEENT:  Normocephalic . Face symmetric, atraumatic Lungs:  CTA B Normal respiratory effort, no intercostal retractions, no accessory muscle use. Heart: RRR,  no murmur.  no pretibial  edema bilaterally  Abdomen:  Not distended, soft, non-tender. No rebound or rigidity.   Skin: Not pale. Not jaundice Neurologic:  alert & oriented X3.  Speech normal, gait appropriate for age and unassisted Psych--  Cognition and judgment appear intact.  Cooperative with normal attention span and concentration.  Behavior appropriate. No anxious or depressed appearing.      Assessment & Plan:    72 year old male PMH includes high cholesterol, HTN, atrial fibrillation (holding  xarelto), GERD, IBS, BCC, renal insufficiency, presents  with  Urolithiasis: Problems with urolithiasis is started few weeks ago, had a stone removal procedure, a stent that was subsequently removed. He has been having pain and gross hematuria for on and off for few weeks, finally symptoms have decreased significantly in the last 3 days. Saw urology yesterday and plans to see him in 2 weeks. Creatinine noted to be slightly elevated at the ER last week, recheck a BMP today Atrial fibrillation: Saw cardiology yesterday, holding Xarelto for few days until he feels better from the urolithiasis perspective. RTC to see PCP in 2 to 3 months

## 2017-10-14 NOTE — Patient Instructions (Signed)
GO TO THE LAB : Get the blood work    See Dr Charlett Blake as schedule

## 2017-10-14 NOTE — Progress Notes (Signed)
Pre visit review using our clinic review tool, if applicable. No additional management support is needed unless otherwise documented below in the visit note. 

## 2017-10-26 ENCOUNTER — Other Ambulatory Visit: Payer: Self-pay | Admitting: Family Medicine

## 2017-10-26 DIAGNOSIS — K760 Fatty (change of) liver, not elsewhere classified: Secondary | ICD-10-CM

## 2017-10-26 DIAGNOSIS — E782 Mixed hyperlipidemia: Secondary | ICD-10-CM

## 2017-10-26 DIAGNOSIS — N289 Disorder of kidney and ureter, unspecified: Secondary | ICD-10-CM

## 2017-10-26 DIAGNOSIS — E559 Vitamin D deficiency, unspecified: Secondary | ICD-10-CM

## 2017-10-26 DIAGNOSIS — I4821 Permanent atrial fibrillation: Secondary | ICD-10-CM

## 2017-10-26 NOTE — Telephone Encounter (Signed)
Requesting:Ativan  Contract:yes UDS:low risk next screen 08/30/17 Last OV:10/14/17 w/ paz  Next OV:04/22/18 Last Refill:08/27/17  [id number]  Database:   Please advise

## 2017-10-30 ENCOUNTER — Other Ambulatory Visit: Payer: Self-pay | Admitting: Family Medicine

## 2017-11-05 ENCOUNTER — Telehealth: Payer: Self-pay

## 2017-11-05 DIAGNOSIS — Z1211 Encounter for screening for malignant neoplasm of colon: Secondary | ICD-10-CM

## 2017-11-05 NOTE — Telephone Encounter (Signed)
Copied from [place] [id number]. Topic: General - Other >> Nov 04, 2017 11:29 AM Sheran Luz wrote: Reason for CRM: Pt called regarding emmi call-stating he would like to have a colon cancer screening and is requesting a call back to discuss/schedule. Please advise.

## 2017-11-08 ENCOUNTER — Encounter: Payer: Self-pay | Admitting: Internal Medicine

## 2017-11-09 ENCOUNTER — Encounter: Payer: Self-pay | Admitting: Internal Medicine

## 2017-11-15 ENCOUNTER — Ambulatory Visit: Payer: Medicare Other | Admitting: Family Medicine

## 2017-11-15 ENCOUNTER — Ambulatory Visit (HOSPITAL_BASED_OUTPATIENT_CLINIC_OR_DEPARTMENT_OTHER)
Admission: RE | Admit: 2017-11-15 | Discharge: 2017-11-15 | Disposition: A | Discharge status: Home / Self Care | Payer: Medicare Other | Source: Ambulatory Visit | Attending: Family Medicine | Admitting: Family Medicine

## 2017-11-15 ENCOUNTER — Encounter: Payer: Self-pay | Admitting: Family Medicine

## 2017-11-15 VITALS — BP 140/80 | HR 57 | Temp 98.0°F | Resp 16 | Ht 72.0 in | Wt 236.0 lb

## 2017-11-15 DIAGNOSIS — M419 Scoliosis, unspecified: Secondary | ICD-10-CM | POA: Insufficient documentation

## 2017-11-15 DIAGNOSIS — I700 Atherosclerosis of aorta: Secondary | ICD-10-CM | POA: Diagnosis not present

## 2017-11-15 DIAGNOSIS — R739 Hyperglycemia, unspecified: Secondary | ICD-10-CM | POA: Diagnosis not present

## 2017-11-15 DIAGNOSIS — N200 Calculus of kidney: Secondary | ICD-10-CM | POA: Diagnosis not present

## 2017-11-15 DIAGNOSIS — Z23 Encounter for immunization: Secondary | ICD-10-CM | POA: Diagnosis not present

## 2017-11-15 DIAGNOSIS — R972 Elevated prostate specific antigen [PSA]: Secondary | ICD-10-CM | POA: Diagnosis not present

## 2017-11-15 DIAGNOSIS — M545 Low back pain, unspecified: Secondary | ICD-10-CM

## 2017-11-15 DIAGNOSIS — F418 Other specified anxiety disorders: Secondary | ICD-10-CM

## 2017-11-15 DIAGNOSIS — N289 Disorder of kidney and ureter, unspecified: Secondary | ICD-10-CM

## 2017-11-15 DIAGNOSIS — E782 Mixed hyperlipidemia: Secondary | ICD-10-CM | POA: Diagnosis not present

## 2017-11-15 DIAGNOSIS — I10 Essential (primary) hypertension: Secondary | ICD-10-CM

## 2017-11-15 NOTE — Assessment & Plan Note (Addendum)
Still struggles with low mood and a difficult marriage. He has not tried the Sertraline yet but is willing to start. Is counseled for 25 minutes

## 2017-11-15 NOTE — Assessment & Plan Note (Signed)
hgba1c acceptable, minimize simple carbs. Increase exercise as tolerated.  

## 2017-11-15 NOTE — Assessment & Plan Note (Signed)
Continue to monitor, hydrate well

## 2017-11-15 NOTE — Patient Instructions (Addendum)
Blood pressure 100-140/60-90 Prostate-Specific Antigen Test Why am I having this test? The prostate-specific antigen (PSA) test is performed to determine how much PSA you have in your blood. PSA is a type of protein that is normally present in the prostate gland. Certain conditions can cause PSA blood levels to increase, such as:  Infection in the prostate (prostatitis).  Enlargement of the prostate (hypertrophy).  Prostate cancer.  Because PSA levels increase greatly from prostate cancer, this test can be used to confirm a diagnosis of prostate cancer. It may also be used to monitor treatment for prostate cancer and to watch for a return of prostate cancer after treatment has finished. This test has a very high false-positive rate. Therefore, routine PSA screening for all men is no longer recommended. A false-positive result is incorrect because it indicates a condition or finding is present when it is not. What kind of sample is taken? A blood sample is required for this test. It is usually collected by inserting a needle into a vein or by sticking a finger with a small needle. How do I prepare for this test? There is no preparation required for this test. However, there are factors that can affect the results of a PSA test. To get the most accurate results:  Avoid having a rectal exam within several hours before having your blood drawn for this test.  Avoid having any procedures performed on the prostate gland within 6 weeks of having this test.  Avoid ejaculating within 24 hours of having this test.  Tell your health care provider if you had a recent urinary tract infection (UTI).  Tell your health care provider if you are taking medicines to assist with hair growth, such as finasteride.  Tell your health care provider if you have been exposed to a medicine called diethylstilbestrol.  Let your health care provider know if any of these factors apply to you. You may be asked to  reschedule the test. What are the reference ranges? Reference ranges are established after testing a large group of people. Reference ranges may vary among different people, labs, and hospitals. It is your responsibility to obtain your test results. Ask the lab or department performing the test when and how you will get your results.  Low: 0-2.5 ng/mL.  Slightly to moderately elevated: 2.6-10.0 ng/mL.  Moderately elevated: 10.0-19.9 ng/mL.  Significantly elevated: 20 ng/mL or greater.  What do the results mean? PSA test results greater than 4 ng/mL are found in the majority of men with prostate cancer. If your test result is above this level, this can indicate an increased risk for prostate cancer. Increased PSA levels can also indicate other health conditions. Talk with your health care provider to discuss your results, treatment options, and if necessary, the need for more tests. Talk with your health care provider if you have any questions about your results. Talk with your health care provider to discuss your results, treatment options, and if necessary, the need for more tests. Talk with your health care provider if you have any questions about your results. This information is not intended to replace advice given to you by your health care provider. Make sure you discuss any questions you have with your health care provider. Document Released: 02/01/2004 Document Revised: 09/04/2015 Document Reviewed: 05/24/2013 Elsevier Interactive Patient Education  [employer].

## 2017-11-15 NOTE — Assessment & Plan Note (Addendum)
Well controlled, no changes to meds. Encouraged heart healthy diet such as the DASH diet and exercise as tolerated. At home his BP is usually over 120/80s

## 2017-11-15 NOTE — Assessment & Plan Note (Signed)
Encouraged heart healthy diet, increase exercise, avoid trans fats, consider a krill oil cap daily 

## 2017-11-15 NOTE — Assessment & Plan Note (Signed)
Has recently been following with urology and has undergone procedures recently. Is doing much better and offers no new complaints

## 2017-11-15 NOTE — Assessment & Plan Note (Addendum)
Encouraged moist heat and gentle stretching as tolerated. May try NSAIDs and prescription meds as directed and report if symptoms worsen or seek immediate care, progressing. Check xray

## 2017-11-16 LAB — COMPREHENSIVE METABOLIC PANEL
ALBUMIN: 4.3 g/dL (ref 3.5–5.2)
ALK PHOS: 70 U/L (ref 39–117)
ALT: 17 U/L (ref 0–53)
AST: 16 U/L (ref 0–37)
BILIRUBIN TOTAL: 0.8 mg/dL (ref 0.2–1.2)
BUN: 20 mg/dL (ref 6–23)
CALCIUM: 10.3 mg/dL (ref 8.4–10.5)
CO2: 25 mEq/L (ref 19–32)
Chloride: 104 mEq/L (ref 96–112)
Creatinine, Ser: 2.04 mg/dL — ABNORMAL HIGH (ref 0.40–1.50)
GFR: 34.24 mL/min — AB (ref >60.00)
GLUCOSE: 91 mg/dL (ref 70–99)
POTASSIUM: 4.4 meq/L (ref 3.5–5.1)
Sodium: 139 mEq/L (ref 135–145)
TOTAL PROTEIN: 6.8 g/dL (ref 6.0–8.3)

## 2017-11-16 LAB — URINALYSIS
Bilirubin Urine: NEGATIVE
Hgb urine dipstick: NEGATIVE
Ketones, ur: NEGATIVE
Leukocytes, UA: NEGATIVE
Nitrite: NEGATIVE
Specific Gravity, Urine: 1.02 (ref 1.000–1.030)
Total Protein, Urine: NEGATIVE
Urine Glucose: NEGATIVE
Urobilinogen, UA: 0.2 (ref 0.0–1.0)
pH: 6 (ref 5.0–8.0)

## 2017-11-16 LAB — URINE CULTURE
MICRO NUMBER:: 91324101
Result:: NO GROWTH
SPECIMEN QUALITY:: ADEQUATE

## 2017-11-16 LAB — CBC
HCT: 41.5 % (ref 39.0–52.0)
Hemoglobin: 13.9 g/dL (ref 13.0–17.0)
MCHC: 33.6 g/dL (ref 30.0–36.0)
MCV: 92.7 fl (ref 78.0–100.0)
PLATELETS: 229 10*3/uL (ref 150.0–400.0)
RBC: 4.47 Mil/uL (ref 4.22–5.81)
RDW: 14.4 % (ref 11.5–15.5)
WBC: 6.9 10*3/uL (ref 4.0–10.5)

## 2017-11-16 LAB — PSA: PSA: 3.36 ng/mL (ref 0.10–4.00)

## 2017-11-16 NOTE — Progress Notes (Signed)
Subjective:    Patient ID: David [name], male    DOB: 1945/04/26, 72 y.o.   MRN: [id number]  Chief Complaint  Patient presents with  . Back Pain    Low back pain, more on the right.     HPI Patient is in today for evaluation of low back pain which now has some radicular pain down his right leg. No recent fall or new trauma but the pain is affecting his activities of daily living. No incontinence. He continues to struggle with anhedonia, depression, anxiety. No suicidal ideation. His marriage is very contentious and they are unable to find a comfortable place but he does not feel counseling will work for them. No polyuria or polydipsia. Denies CP/palp/SOB/HA/congestion/fevers/GI or GU c/o. Taking meds as prescribed  Past Medical History:  Diagnosis Date  . Abnormal liver function 03/18/2013  . Adenoma of right adrenal gland 07/11/2002   2.4cm , noted on CT ABD  . Anxiety   . Arthritis of both knees 03/26/2016  . Astigmatism   . Atrial fibrillation ([place])    x1 successful cardioversions  . Back pain   . BCC (basal cell carcinoma of skin)    under right eye and right ear  . Cataract 09/29/2016  . Depression   . Diverticulitis   . Diverticulosis   . Fatty liver   . GERD (gastroesophageal reflux disease)   . Gout   . Hearing loss of both ears 03/26/2016  . History of chronic prostatitis    started at age 12  . History of kidney stones   . Hyperglycemia 03/29/2016  . Hyperlipidemia   . Hypertension   . IBS (irritable bowel syndrome) 03/18/2013  . Incomplete right bundle branch block (RBBB)   . Internal hemorrhoids   . Kidney lesion 06/07/2015   Right midportion, 1.1x1.1 cm hyperechoic, noted on [place] ABD  . LAFB (left anterior fascicular block)   . Lipoma of axilla 09/2016  . Liver lesion, right lobe 06/07/2015   2.5x2.4x2.4 cm hypoechoic lesion posterior aspect, noted on [place] ABD  . Low back pain 03/26/2016  . LVH (left ventricular hypertrophy) 08/06/2017   Moderate, noted on  ECHO  . Medicare annual wellness visit, subsequent 03/12/2014  . Muscle cramps   . Nocturia   . OA (osteoarthritis)    Back, Hands, Neck  . Obesity 11/25/2007   Qualifier: Diagnosis of  By: Lenna Gilford MD, Deborra Medina   . Other malaise and fatigue 03/13/2013  . Premature ventricular complex   . Preventative health care 03/07/2015  . Renal insufficiency   . Scoliosis    Upper thoracic and lumbar  . Vitamin D deficiency    resolved    Past Surgical History:  Procedure Laterality Date  . CARDIOVERSION  07/31/2017  . CATARACT EXTRACTION, BILATERAL    . COLON SURGERY  2009   segmental sigmoid resection  . COLONOSCOPY    . HOLMIUM LASER APPLICATION Right 10/03/1939   Procedure: HOLMIUM LASER APPLICATION;  Surgeon: Kathie Rhodes, MD;  Location[facility];  Service: Urology;  Laterality: Right;  . NOSE SURGERY     Submucous resection age 86  . NUCLEAR STRESS TEST  06/03/2009  . SKIN BIOPSY    . URETEROSCOPY WITH HOLMIUM LASER LITHOTRIPSY Right 09/24/2017   Procedure: CYSTOSCOPY, URETEROSCOPY WITH HOLMIUM LASER LITHOTRIPSY, STENT PLACEMENT;  Surgeon: Kathie Rhodes, MD;  Location: [facility];  Service: Urology;  Laterality: Right;    Family History  Problem Relation Age of Onset  .  Heart disease Mother   . Hypertension Mother   . Stroke Mother   . Colon cancer Mother   . Breast cancer Mother   . Heart disease Father        pacemaker  . Aortic aneurysm Father   . Hypertension Father   . Heart disease Sister   . Atrial fibrillation Sister   . Obesity Sister   . Sleep apnea Sister   . Heart attack Brother   . Other Brother        muscle disease  . Arthritis Brother   . Stroke Brother   . Atrial fibrillation Brother   . Cancer Maternal Grandmother        ?  Marland Kitchen Heart attack Maternal Grandmother   . Diabetes Maternal Grandmother   . Cancer Maternal Grandfather        hodgin's lymphoma  . Heart attack Paternal Grandmother   . Anxiety disorder Paternal  Grandmother   . Pneumonia Paternal Grandfather   . Heart attack Brother   . Atrial fibrillation Brother   . Atrial fibrillation Brother   . Heart attack Brother   . Hypertension Brother   . Hyperlipidemia Brother   . Heart attack Brother   . Other Brother        heart valve operation  . Atrial fibrillation Brother     Social History   Socioeconomic History  . Marital status: Married    Spouse name: Not on file  . Number of children: 3  . Years of education: Not on file  . Highest education level: Not on file  Occupational History  . Occupation: Nurse, adult: [employer]  . Financial resource strain: Not on file  . Food insecurity:    Worry: Not on file    Inability: Not on file  . Transportation needs:    Medical: Not on file    Non-medical: Not on file  Tobacco Use  . Smoking status: Former Smoker    Packs/day: 1.00    Years: 6.00    Pack years: 6.00    Types: Cigarettes    Start date: 01/12/1969  . Smokeless tobacco: Never Used  . Tobacco comment: [id number]  Substance and Sexual Activity  . Alcohol use: Not Currently    Comment: 2 drinks 5 nights a week   . Drug use: No  . Sexual activity: Yes  Lifestyle  . Physical activity:    Days per week: Not on file    Minutes per session: Not on file  . Stress: Not on file  Relationships  . Social connections:    Talks on phone: Not on file    Gets together: Not on file    Attends religious service: Not on file    Active member of club or organization: Not on file    Attends meetings of clubs or organizations: Not on file    Relationship status: Not on file  . Intimate partner violence:    Fear of current or ex partner: Not on file    Emotionally abused: Not on file    Physically abused: Not on file    Forced sexual activity: Not on file  Other Topics Concern  . Not on file  Social History Narrative   The patient is married for the second time, he has 3 sons.   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   2 alcoholic beverages most days.   1 caffeinated beverage daily   No  drug use no current tobacco use he is a prior smoker   12/24/2016    Outpatient Medications Prior to Visit  Medication Sig Dispense Refill  . acetaminophen (TYLENOL) 325 MG tablet Take 650 mg by mouth every 6 (six) hours as needed.    Marland Kitchen alum hydroxide-mag trisilicate (GAVISCON) 78-29 MG CHEW Chew by mouth as needed.     Marland Kitchen atenolol (TENORMIN) 25 MG tablet TAKE ONE TABLET BY MOUTH TWICE A DAY (Patient taking differently: 25 mg. ) 180 tablet 2  . Colchicine 0.6 MG CAPS 1 tab po bid (Patient taking differently: as needed. 1 tab po bid) 60 capsule 0  . cyclobenzaprine (FLEXERIL) 5 MG tablet Take 1 tablet (5 mg total) by mouth 3 (three) times daily as needed for muscle spasms. (Patient taking differently: Take 5 mg by mouth as needed for muscle spasms. ) 30 tablet 1  . fluticasone (FLONASE) 50 MCG/ACT nasal spray Place 2 sprays into the nose daily as needed. For congestion    . HYDROcodone-acetaminophen (NORCO) 10-325 MG tablet Take 1-2 tablets by mouth every 4 (four) hours as needed for moderate pain. Maximum dose per 24 hours - 8 pills 20 tablet 0  . irbesartan (AVAPRO) 150 MG tablet TAKE ONE TABLET BY MOUTH DAILY 63 tablet 0  . LORazepam (ATIVAN) 1 MG tablet Take 0.5 tablets (0.5 mg total) by mouth 2 (two) times daily as needed. 90 tablet 1  . mupirocin ointment (BACTROBAN) 2 % Apply to area twice daily. 22 g 0  . omeprazole (PRILOSEC) 20 MG capsule TAKE ONE CAPSULE BY MOUTH DAILY (Patient taking differently: daily. ) 30 capsule 4  . rivaroxaban (XARELTO) 20 MG TABS tablet Take 1 tablet (20 mg total) by mouth daily with supper. 30 tablet 11  . sertraline (ZOLOFT) 50 MG tablet Take 1 tablet (50 mg total) by mouth daily. 30 tablet 3  . tamsulosin (FLOMAX) 0.4 MG CAPS capsule Take 0.4 mg by mouth daily after supper.     No facility-administered medications prior to visit.     Allergies  Allergen Reactions  .  Cefaclor Hives  . Cephalosporins Hives  . Penicillins Rash    Mild maculopapular rash    Review of Systems  Constitutional: Negative for fever and malaise/fatigue.  HENT: Negative for congestion.   Eyes: Negative for blurred vision.  Respiratory: Negative for shortness of breath.   Cardiovascular: Negative for chest pain, palpitations and leg swelling.  Gastrointestinal: Negative for abdominal pain, blood in stool and nausea.  Genitourinary: Negative for dysuria and frequency.  Musculoskeletal: Positive for back pain. Negative for falls.  Skin: Negative for rash.  Neurological: Negative for dizziness, loss of consciousness and headaches.  Endo/Heme/Allergies: Negative for environmental allergies.  Psychiatric/Behavioral: Positive for depression. The patient is nervous/anxious.        Objective:    Physical Exam  Constitutional: He is oriented to person, place, and time. He appears well-developed and well-nourished. No distress.  HENT:  Head: Normocephalic and atraumatic.  Nose: Nose normal.  Eyes: Right eye exhibits no discharge. Left eye exhibits no discharge.  Neck: Normal range of motion. Neck supple.  Cardiovascular: Normal rate and regular rhythm.  No murmur heard. Pulmonary/Chest: Effort normal and breath sounds normal.  Abdominal: Soft. Bowel sounds are normal. There is no tenderness.  Musculoskeletal: He exhibits no edema.  Neurological: He is alert and oriented to person, place, and time.  Skin: Skin is warm and dry.  Psychiatric: He has a normal mood and affect.  Nursing  note and vitals reviewed.   BP 140/80 (BP Location: Right Arm, Patient Position: Sitting, Cuff Size: Small)   Pulse (!) 57   Temp 98 F (36.7 C) (Oral)   Resp 16   Ht 6' (1.829 m)   Wt 236 lb (107 kg)   SpO2 96%   BMI 32.01 kg/m  Wt Readings from Last 3 Encounters:  11/15/17 236 lb (107 kg)  10/14/17 236 lb 6 oz (107.2 kg)  10/12/17 237 lb 12.8 oz (107.9 kg)     Lab Results    Component Value Date   WBC 7.5 10/08/2017   HGB 14.1 10/08/2017   HCT 41.8 10/08/2017   PLT 252.0 10/08/2017   GLUCOSE 95 10/14/2017   CHOL 202 (H) 10/08/2017   TRIG 94.0 10/08/2017   HDL 33.00 (L) 10/08/2017   LDLDIRECT 168.5 03/29/2006   LDLCALC 151 (H) 10/08/2017   ALT 24 10/08/2017   AST 21 10/08/2017   NA 135 10/14/2017   K 4.5 10/14/2017   CL 102 10/14/2017   CREATININE 1.57 (H) 10/14/2017   BUN 18 10/14/2017   CO2 27 10/14/2017   TSH 3.05 10/08/2017   PSA 3.16 03/30/2017   INR 1.39 11/14/2010   HGBA1C 5.7 10/08/2017    Lab Results  Component Value Date   TSH 3.05 10/08/2017   Lab Results  Component Value Date   WBC 7.5 10/08/2017   HGB 14.1 10/08/2017   HCT 41.8 10/08/2017   MCV 91.5 10/08/2017   PLT 252.0 10/08/2017   Lab Results  Component Value Date   NA 135 10/14/2017   K 4.5 10/14/2017   CO2 27 10/14/2017   GLUCOSE 95 10/14/2017   BUN 18 10/14/2017   CREATININE 1.57 (H) 10/14/2017   BILITOT 0.8 10/08/2017   ALKPHOS 73 10/08/2017   AST 21 10/08/2017   ALT 24 10/08/2017   PROT 6.9 10/08/2017   ALBUMIN 4.2 10/08/2017   CALCIUM 10.4 10/14/2017   ANIONGAP 8 10/03/2017   GFR 46.33 (L) 10/14/2017   Lab Results  Component Value Date   CHOL 202 (H) 10/08/2017   Lab Results  Component Value Date   HDL 33.00 (L) 10/08/2017   Lab Results  Component Value Date   LDLCALC 151 (H) 10/08/2017   Lab Results  Component Value Date   TRIG 94.0 10/08/2017   Lab Results  Component Value Date   CHOLHDL 6 10/08/2017   Lab Results  Component Value Date   HGBA1C 5.7 10/08/2017       Assessment & Plan:   Problem List Items Addressed This Visit    Hyperlipidemia, mixed    Encouraged heart healthy diet, increase exercise, avoid trans fats, consider a krill oil cap daily      Essential hypertension, benign    Well controlled, no changes to meds. Encouraged heart healthy diet such as the DASH diet and exercise as tolerated. At home his BP is  usually over 120/80s      Relevant Orders   Comprehensive metabolic panel   CBC   Renal insufficiency    Continue to monitor, hydrate well      Relevant Orders   Urinalysis   Urine Culture   Depression with anxiety    Still struggles with low mood and a difficult marriage. He has not tried the Sertraline yet but is willing to start. Is counseled for 25 minutes      Low back pain    Encouraged moist heat and gentle stretching as tolerated. May try  NSAIDs and prescription meds as directed and report if symptoms worsen or seek immediate care, progressing. Check xray      Relevant Orders   Urinalysis   Urine Culture   DG Lumbar Spine Complete (Completed)   Hyperglycemia    hgba1c acceptable, minimize simple carbs. Increase exercise as tolerated.       Kidney stone    Has recently been following with urology and has undergone procedures recently. Is doing much better and offers no new complaints       Other Visit Diagnoses    Needs flu shot    -  Primary   Relevant Orders   Flu vaccine HIGH DOSE PF (Fluzone High dose) (Completed)   Rising PSA level       Relevant Orders   PSA      I am having David [name] "Fred" maintain his fluticasone, alum hydroxide-mag trisilicate, mupirocin ointment, cyclobenzaprine, omeprazole, atenolol, Colchicine, sertraline, acetaminophen, tamsulosin, HYDROcodone-acetaminophen, rivaroxaban, LORazepam, and irbesartan.  No orders of the defined types were placed in this encounter.    Penni Homans, MD

## 2017-11-17 NOTE — Addendum Note (Signed)
Addended byDamita Dunnings D on: 11/17/2017 09:56 AM   Modules accepted: Orders

## 2017-11-19 ENCOUNTER — Encounter: Payer: Self-pay | Admitting: Family Medicine

## 2017-11-24 ENCOUNTER — Other Ambulatory Visit: Payer: Self-pay | Admitting: Urology

## 2017-11-24 ENCOUNTER — Telehealth: Payer: Self-pay | Admitting: Family Medicine

## 2017-11-24 NOTE — Telephone Encounter (Signed)
Copied from [place] [phone]. Topic: General - Other >> Nov 24, 2017  9:56 AM Yvette Rack wrote: Reason for CRM: Pt states he was advised by the Urologist that he has a blocked kidney. Pt states  he would like to speak with a nurse to discuss as he has a few questions. Pt requests call back. Cb# [phone]

## 2017-11-24 NOTE — Telephone Encounter (Signed)
Spoke with Sharl Ma at [facility] regarding pt discharge information; she connected me to Dr Simone Curia nurse; conference call initiated with Jenny Reichmann, triage RN; she explained to the pt that he has a stone blocking his right ureter,and hydronephrosis; this will require ureteroscopy; she instructed the pt to take his flomax and pain medication; she also explained to pt that he needs to stop his xarelto prior to surgery (last day taken11/12/19); Jenny Reichmann also explains to the pt that he will receive a call from the office with time and date of procedure; Jenny Reichmann also gave the pt return to office instructions (inability to urinate, increased bleeding/clots, and fever) he verbalized understanding; Jenny Reichmann will also fax this information to Dr Charlett Blake, [facility]; will route to office for notification of this encounter.

## 2017-11-24 NOTE — Telephone Encounter (Signed)
Contact pt regarding his concerns; he says that he had a kidney stone removed 09/24/17, and has had a difficult time since then; he says that he was told that "his kidney was 60% gone"; the pt says that the urologist did an ultrasound and was told that he had a blocked kidney due a ureter blocked by a stone; the pt was seen by Dr Karsten Ro ([facility]) on 11/24/17; the pt is concerned about what signs he should look for? he also states that he has to stop his zarelto prior to surger; the pt says that he was not given discharge instructions from the physician; please see [facility] with [facility] dated 11/24/17 at 1019; the pt verbalized understanding of instructions; will route to office for notification of this encounter.

## 2017-11-25 ENCOUNTER — Other Ambulatory Visit: Payer: Self-pay

## 2017-11-25 ENCOUNTER — Encounter (HOSPITAL_COMMUNITY): Payer: Self-pay | Admitting: *Deleted

## 2017-11-27 NOTE — H&P (Signed)
HPI: David [name] is a 72 year-old male with persistant right ureteral fragments after ESWL.  The patient's stone was on his right side. He is currently having flank pain. He denies having back pain, groin pain, nausea, vomiting, fever, and chills. He does not have a burning sensation when he urinates.   He has had ESWL for treatment of his stones in the past. This condition would be considered of mild to moderate severity with no modifying factors or associated signs or symptoms other than as noted above.   Calculus disease: CT scan on 08/18/17 revealed an 8 mm stone in the right ureter at the level of the L5-S1 interspace with Hounsfield units of 1100. Bilateral, nonobstructing renal calculi were noted as well.   10/07/17: He underwent right ureteroscopy and laser lithotripsy with dusting of a right ureteral calculus on 09/24/17. A stent was left in placed and was removed on 09/30/17. He experienced renal colic with a stent was removed.  He notes that he continues this still see some hematuria. He is also having some pain in the right lower quadrant and lower abdomen that he describes a cramping type sensation. He said he was seen in the ER and was placed on Bactrim. When he was seen in the ER on 10/03/17 his CBC was normal with a white count of 4.0, his creatinine was 1.63 which was stable and his urinalysis was nitrite positive but was discolored I and he was only noted to have a few bacteria and a urine culture was performed which was found to be negative.   10/13/17: He has returned today indicating that overall he seems to be doing a little bit better but still has not made the progress that he thought he would. Blood in his urine has decreased. He is still having some discomfort in the right lower quadrant that radiates into the bladder and now actually into the area of the urethra. In addition he said he has noted decreased energy levels but he said yesterday felt better than he has in quite some  time. He has increased his fluid intake dramatically. He also said he has seen some "silt" pass.   10/21/17: He returns today for follow up. Overall he states that symptoms continue to improved following most recent office visit. He continues to see what he describes as "silt" like material pass intermittently, with noticeably more over the past week. He continues to have some intermittent RLQ pain that radiates into his right groin at times. Currently he feels that his pain is manageable. He is no longer using narcotic pain medication and is able to manage with Tylenol. He denies any difficulties voiding today. He denies gross hematuria, but does have some discomfort associated with voiding intermittently. He denies fever, chills, nausea, or vomiting.   11/24/17: He reports to me today that he is having much less discomfort. He says he occasionally has what he describes as a "twinge". He also has some low back pain a little bit more on the right but does have some degenerative disease of the spine. He has not seen any hematuria.     ALLERGIES: Cefaclor CAPS Cephalosporins Penicillins    MEDICATIONS: Atenolol 100 mg tablet Oral  Colchicine 0.6 MG Oral Tablet Oral  Flexeril 5 mg tablet Oral  Flonase 50 MCG/ACT Nasal Suspension Nasal  Irbesartan  Lorazepam 1 mg tablet Oral  Naproxen TABS Oral  Vitamin D TABS Oral  Xarelto     GU PSH: Ureteroscopic laser litho, Right -  09/24/2017      PSH Notes: Small Bowel Resection, Nose Surgery   NON-GU PSH: Nose Surgery (Unspecified) Small bowel resection - 2012    GU PMH: Hydronephrosis, He does have hydronephrosis today but he is not having a lot of pain and his creatinine remains stable. This is secondary to his Steinstrasse. - 10/07/2017 Ureteral calculus, Right, He had a large stone dusted and has developed a Steinstrasse. We discussed the options including stent placement, ureteroscopy and stone removal versus conservative management and he  would like to try to avoid a 2nd procedure so I am going to place him on tamsulosin and he will return in 2 weeks for a KUB. - 10/07/2017, (Stable, Acute), Right, Because his stone is very difficult to visualize on a plain film I do not feel he would be a good candidate for lithotripsy and therefore have discussed proceeding with ureteroscopy due to the fact that the stone has failed to progress. He will come off of his Xarelto 2 days prior to procedure., - 09/08/2017 (Acute), Right, After discussing the options for management of his ureteral calculus he has elected to proceed with medical expulsive therapy. I told him there was probably going to be a low probability of spontaneous passage although he is not obstructed and not having any pain so I will reimage him again in 2 weeks. If his stone persists he told me he would per for lithotripsy over ureteroscopy., - 08/25/2017 Hematospermia (Worsening), We discussed the benign nature of hematospermia. With a reportedly normal DRE and a PSA that is normal at 3.16 as well as a negative CT scan further evaluation is not necessary at this time. - 08/25/2017, We discussed the benign nature of amounts permit. This has occurred in the past. He has no voiding symptoms other than some mild nocturia and slowing of his urinary stream. He has not had a PSA done so we will check that but I found no abnormality on his DRE. I did offer finasteride as an option off label but he did not want to consider this., - 2018, Hematospermia, - 2014 Renal calculus, Bilateral, He was found to have bilateral, nonobstructing renal calculi by CT scan. - 08/25/2017 BPH w/LUTS, He does have some BPH by exam. - 2018 Encounter for Prostate Cancer screening, I will check a screening PSA today especially because he has had hemospermia. - 2018, Prostate cancer screening, - 2014 Nocturia, His nocturia and slowing of his urinary stream are not enough of a bother for him that he would want to consider  pharmacologic therapy at this time. - 2018 Adrenal mass Unspec, Adrenal cortical adenoma, unspecified laterality - 2014 BPH w/o LUTS, Benign prostatic hypertrophy without lower urinary tract symptoms - 2014      PMH Notes: Hematospermia/hematuria: In 12/12 the patient had reported an episode of hemospermia and then described the initiation of his urinary stream after that an ejaculation as containing blood. He does have a past history of prostatitis. He also has had atrial fibrillation and been on Pradaxa. He stopped the Peradaxa and about a week later is when he saw what he describes as a single drop of blood in his semen. When he urinated the next time he saw a small amount of blood at the beginning of his urinary stream but no further blood in the urine. This pattern repeated itself a second time and has not recurred since. He has no discomfort with ejaculation.   NON-GU PMH: Anxiety, Anxiety - 2014 Personal history of  other diseases of the circulatory system, History of hypertension - 2014 Personal history of other endocrine, nutritional and metabolic disease, History of hypercholesterolemia - 2014 Personal history of other mental and behavioral disorders, History of depression - 2014 Personal history of other specified conditions, History of heartburn - 2014    FAMILY HISTORY: Acute Myocardial Infarction - Brother, Brother Death In The Family Father - Sister Death In The Family Mother - Sister Family Health Status Number - Sister Heart Disease - Sister   SOCIAL HISTORY: Marital Status: Married Preferred Language: English; Ethnicity: Not Hispanic Or Latino; Race: White Current Smoking Status: Patient does not smoke anymore. Has not smoked since 01/12/2010.   Tobacco Use Assessment Completed: Used Tobacco in last 30 days? Does drink.  Drinks 2 caffeinated drinks per day.     Notes: Occupation:, Alcohol Use, Caffeine Use, Marital History - Currently Married, Tobacco Use   REVIEW OF  SYSTEMS:    GU Review Male:   Patient denies frequent urination, hard to postpone urination, burning/ pain with urination, get up at night to urinate, leakage of urine, stream starts and stops, trouble starting your stream, have to strain to urinate , erection problems, and penile pain.  Gastrointestinal (Upper):   Patient denies nausea, vomiting, and indigestion/ heartburn.  Gastrointestinal (Lower):   Patient denies diarrhea and constipation.  Constitutional:   Patient denies fever, night sweats, weight loss, and fatigue.  Skin:   Patient denies itching and skin rash/ lesion.  Eyes:   Patient denies blurred vision and double vision.  Ears/ Nose/ Throat:   Patient denies sore throat and sinus problems.  Hematologic/Lymphatic:   Patient denies swollen glands and easy bruising.  Cardiovascular:   Patient denies leg swelling and chest pains.  Respiratory:   Patient denies cough and shortness of breath.  Endocrine:   Patient denies excessive thirst.  Musculoskeletal:   Patient denies back pain and joint pain.  Neurological:   Patient denies headaches and dizziness.  Psychologic:   Patient denies depression and anxiety.   VITAL SIGNS Weight 236 lb / 107.05 kg  Height 72 in / 182.88 cm  BP 124/56 mmHg  Pulse 50 /min  BMI 32.0 kg/m   Physical Exam  Constitutional: Well nourished and well developed . No acute distress.   ENT:. The ears and nose are normal in appearance.   Neck: The appearance of the neck is normal and no neck mass is present.   Pulmonary: No respiratory distress and normal respiratory rhythm and effort.   Cardiovascular: Heart rate and rhythm are normal . No peripheral edema.   Abdomen: The abdomen is soft and nontender. No masses are palpated. No CVA tenderness. No hernias are palpable. No hepatosplenomegaly noted.   Rectal: Rectal exam demonstrates normal sphincter tone, no tenderness and no masses. The prostate has no nodularity and is not tender. The left seminal  vesicle is nonpalpable. The right seminal vesicle is nonpalpable. The perineum is normal on inspection.   Genitourinary: Examination of the penis demonstrates no discharge, no masses, no lesions and a normal meatus. The scrotum is without lesions. The right epididymis is palpably normal and non-tender. The left epididymis is palpably normal and non-tender. The right testis is non-tender and without masses. The left testis is non-tender and without masses.   Lymphatics: The femoral and inguinal nodes are not enlarged or tender.   Skin: Normal skin turgor, no visible rash and no visible skin lesions.   Neuro/Psych:. Mood and affect are appropriate.  PAST DATA REVIEWED:  Source Of History:  Patient  Lab Test Review:   BUN/Creatinine  Records Review:   Previous Patient Records  X-Ray Review: KUB: Reviewed Films. Reviewed Films. Previous KUB images were reviewed and compared with today's study.    03/30/17 02/03/16 01/08/11  PSA  Total PSA 3.16 ng/dl 2.02 ng/dl 1.29    Notes:                     His creatinine on 11/15/17 was noted to be 2.04. This is up slightly from where it has been in the past.   PROCEDURES:         Renal Ultrasound (Limited) - [id number]  Right Kidney: Length: 12.5 cm Depth: 6.3 cm Cortical Width: 1.3 cm Width: 6.2 cm    Right Kidney/Ureter:  Hydronephrosis noted. ------ 0.4 cm calcification noted in the mid kidney.   Bladder:  PVR = 45.2 ml.      Independent review of his ultrasound images reveals presence of moderate to severe right hydronephrosis. There was a stone located within the kidney as well.          KUB - [id number]  A single view of the abdomen is obtained.      Independent review of his KUB today reveals what appeared to be densities in the anticipated location of his right ureter just above the sacrum on the right-hand side. They appear to be less in volume when compared to his previous KUB.         Urinalysis Dipstick Dipstick Cont'd  Color:  Yellow Bilirubin: Neg mg/dL  Appearance: Clear Ketones: Neg mg/dL  Specific Gravity: 1.025 Blood: Neg ery/uL  pH: 5.5 Protein: Trace mg/dL  Glucose: Neg mg/dL Urobilinogen: 0.2 mg/dL    Nitrites: Neg    Leukocyte Esterase: Neg leu/uL    ASSESSMENT/PLAN:     ICD-10 Details  1 GU:   Ureteral calculus - N20.1 Right, Stable - Although he has cleared stone from his lithotripsy there still appears to be stone in his right ureter.  2   Renal calculus - N20.0 Bilateral, Stable - He has right renal calculus that was seen on his KUB and his ultrasound. It is not causing any obstruction.  3   Hydronephrosis - N13.0 Right, He has hydronephrosis on the right-hand side and therefore I have recommended we proceed with treatment of his right ureteral calculi.              Notes:   Because of the persistent stone and hydronephrosis I have recommended ureteroscopic management. We went over ureteroscopy in detail including its risks and complications. We discussed the outpatient nature of the procedure, the need for a stent postoperatively and the anticipated postoperative course. He will need to come off of his Xarelto prior to the procedure.

## 2017-11-27 NOTE — Discharge Instructions (Signed)

## 2017-11-28 NOTE — Anesthesia Preprocedure Evaluation (Addendum)
Anesthesia Evaluation  Patient identified by MRN, date of birth, ID band Patient awake    Reviewed: Allergy & Precautions, H&P , NPO status , Patient's Chart, lab work & pertinent test results  History of Anesthesia Complications Negative for: history of anesthetic complications  Airway Mallampati: II  TM Distance: >3 FB Neck ROM: Full    Dental no notable dental hx. (+) Dental Advisory Given   Pulmonary former smoker,    Pulmonary exam normal        Cardiovascular hypertension, Pt. on home beta blockers Normal cardiovascular exam+ dysrhythmias Atrial Fibrillation   Study Conclusions  - Left ventricle: The cavity size was normal. Wall thickness was   increased in a pattern of moderate LVH. Systolic function was   normal. The estimated ejection fraction was in the range of 60%   to 65%. Left ventricular diastolic function parameters were   normal. - Aortic valve: There was mild regurgitation. - Right ventricle: The cavity size was mildly dilated. - Atrial septum: No defect or patent foramen ovale was identified.   Neuro/Psych PSYCHIATRIC DISORDERS Anxiety Depression negative neurological ROS     GI/Hepatic GERD  Medicated,  Endo/Other    Renal/GU Renal InsufficiencyRenal disease     Musculoskeletal  (+) Arthritis ,   Abdominal   Peds  Hematology   Anesthesia Other Findings   Reproductive/Obstetrics                           Anesthesia Physical  Anesthesia Plan  ASA: III  Anesthesia Plan: General   Post-op Pain Management:    Induction: Intravenous  PONV Risk Score and Plan: 2 and Ondansetron, Dexamethasone and Treatment may vary due to age or medical condition  Airway Management Planned: LMA  Additional Equipment:   Intra-op Plan:   Post-operative Plan: Extubation in OR  Informed Consent: I have reviewed the patients History and Physical, chart, labs and discussed the  procedure including the risks, benefits and alternatives for the proposed anesthesia with the patient or authorized representative who has indicated his/her understanding and acceptance.   Dental advisory given  Plan Discussed with: CRNA and Anesthesiologist  Anesthesia Plan Comments:       Anesthesia Quick Evaluation

## 2017-11-29 ENCOUNTER — Ambulatory Visit (HOSPITAL_COMMUNITY): Payer: Medicare Other

## 2017-11-29 ENCOUNTER — Ambulatory Visit (HOSPITAL_COMMUNITY)
Admission: RE | Admit: 2017-11-29 | Discharge: 2017-11-29 | Disposition: A | Discharge status: Home / Self Care | Payer: Medicare Other | Source: Ambulatory Visit | Attending: Urology | Admitting: Urology

## 2017-11-29 ENCOUNTER — Encounter (HOSPITAL_COMMUNITY): Payer: Self-pay | Admitting: *Deleted

## 2017-11-29 ENCOUNTER — Ambulatory Visit (HOSPITAL_COMMUNITY): Payer: Medicare Other | Admitting: Anesthesiology

## 2017-11-29 ENCOUNTER — Encounter (HOSPITAL_COMMUNITY)
Admission: RE | Disposition: A | Discharge status: Home / Self Care | Payer: Self-pay | Source: Ambulatory Visit | Attending: Urology

## 2017-11-29 DIAGNOSIS — Z881 Allergy status to other antibiotic agents status: Secondary | ICD-10-CM | POA: Diagnosis not present

## 2017-11-29 DIAGNOSIS — E7800 Pure hypercholesterolemia, unspecified: Secondary | ICD-10-CM | POA: Insufficient documentation

## 2017-11-29 DIAGNOSIS — Z79899 Other long term (current) drug therapy: Secondary | ICD-10-CM | POA: Insufficient documentation

## 2017-11-29 DIAGNOSIS — F419 Anxiety disorder, unspecified: Secondary | ICD-10-CM | POA: Insufficient documentation

## 2017-11-29 DIAGNOSIS — Z87442 Personal history of urinary calculi: Secondary | ICD-10-CM | POA: Insufficient documentation

## 2017-11-29 DIAGNOSIS — Z791 Long term (current) use of non-steroidal anti-inflammatories (NSAID): Secondary | ICD-10-CM | POA: Diagnosis not present

## 2017-11-29 DIAGNOSIS — F329 Major depressive disorder, single episode, unspecified: Secondary | ICD-10-CM | POA: Diagnosis not present

## 2017-11-29 DIAGNOSIS — R12 Heartburn: Secondary | ICD-10-CM | POA: Diagnosis not present

## 2017-11-29 DIAGNOSIS — Z880 Allergy status to penicillin: Secondary | ICD-10-CM | POA: Insufficient documentation

## 2017-11-29 DIAGNOSIS — I10 Essential (primary) hypertension: Secondary | ICD-10-CM | POA: Diagnosis not present

## 2017-11-29 DIAGNOSIS — Z8249 Family history of ischemic heart disease and other diseases of the circulatory system: Secondary | ICD-10-CM | POA: Insufficient documentation

## 2017-11-29 DIAGNOSIS — Z7901 Long term (current) use of anticoagulants: Secondary | ICD-10-CM | POA: Insufficient documentation

## 2017-11-29 DIAGNOSIS — K219 Gastro-esophageal reflux disease without esophagitis: Secondary | ICD-10-CM | POA: Insufficient documentation

## 2017-11-29 DIAGNOSIS — N132 Hydronephrosis with renal and ureteral calculous obstruction: Secondary | ICD-10-CM | POA: Diagnosis not present

## 2017-11-29 DIAGNOSIS — Z87891 Personal history of nicotine dependence: Secondary | ICD-10-CM | POA: Diagnosis not present

## 2017-11-29 DIAGNOSIS — M1990 Unspecified osteoarthritis, unspecified site: Secondary | ICD-10-CM | POA: Diagnosis not present

## 2017-11-29 DIAGNOSIS — I351 Nonrheumatic aortic (valve) insufficiency: Secondary | ICD-10-CM | POA: Insufficient documentation

## 2017-11-29 DIAGNOSIS — I4891 Unspecified atrial fibrillation: Secondary | ICD-10-CM | POA: Insufficient documentation

## 2017-11-29 DIAGNOSIS — N201 Calculus of ureter: Secondary | ICD-10-CM

## 2017-11-29 HISTORY — PX: CYSTOSCOPY/URETEROSCOPY/HOLMIUM LASER/STENT PLACEMENT: SHX6546

## 2017-11-29 SURGERY — CYSTOSCOPY/URETEROSCOPY/HOLMIUM LASER/STENT PLACEMENT
Anesthesia: General | Laterality: Right

## 2017-11-29 MED ORDER — IOHEXOL 300 MG/ML  SOLN
INTRAMUSCULAR | Status: DC | PRN
  Administered 2017-11-29: 14 mL

## 2017-11-29 MED ORDER — LACTATED RINGERS IV SOLN
INTRAVENOUS | Status: DC
  Administered 2017-11-29: 08:00:00 via INTRAVENOUS

## 2017-11-29 MED ORDER — FENTANYL CITRATE (PF) 100 MCG/2ML IJ SOLN
INTRAMUSCULAR | Status: AC
  Filled 2017-11-29: qty 2

## 2017-11-29 MED ORDER — FENTANYL CITRATE (PF) 100 MCG/2ML IJ SOLN
INTRAMUSCULAR | Status: DC | PRN
  Administered 2017-11-29: 50 ug via INTRAVENOUS
  Administered 2017-11-29: 25 ug via INTRAVENOUS

## 2017-11-29 MED ORDER — HYDROMORPHONE HCL 1 MG/ML IJ SOLN: 0.2500 mg | INTRAMUSCULAR | Status: DC | PRN

## 2017-11-29 MED ORDER — EPHEDRINE SULFATE-NACL 50-0.9 MG/10ML-% IV SOSY
PREFILLED_SYRINGE | INTRAVENOUS | Status: DC | PRN
  Administered 2017-11-29 (×2): 5 mg via INTRAVENOUS

## 2017-11-29 MED ORDER — CIPROFLOXACIN IN D5W 400 MG/200ML IV SOLN
400.0000 mg | Freq: Once | INTRAVENOUS | Status: AC
  Administered 2017-11-29: 400 mg via INTRAVENOUS
  Filled 2017-11-29: qty 200

## 2017-11-29 MED ORDER — PROMETHAZINE HCL 25 MG/ML IJ SOLN: 6.2500 mg | INTRAMUSCULAR | Status: DC | PRN

## 2017-11-29 MED ORDER — ONDANSETRON HCL 4 MG/2ML IJ SOLN
INTRAMUSCULAR | Status: AC
  Filled 2017-11-29: qty 2

## 2017-11-29 MED ORDER — 0.9 % SODIUM CHLORIDE (POUR BTL) OPTIME
TOPICAL | Status: DC | PRN
  Administered 2017-11-29: 1000 mL

## 2017-11-29 MED ORDER — DEXAMETHASONE SODIUM PHOSPHATE 10 MG/ML IJ SOLN
INTRAMUSCULAR | Status: AC
  Filled 2017-11-29: qty 1

## 2017-11-29 MED ORDER — HYDROCODONE-ACETAMINOPHEN 10-325 MG PO TABS: 1.0000 | ORAL_TABLET | ORAL | 0 refills | Status: DC | PRN

## 2017-11-29 MED ORDER — ACETAMINOPHEN 10 MG/ML IV SOLN
INTRAVENOUS | Status: DC | PRN
  Administered 2017-11-29: 1000 mg via INTRAVENOUS

## 2017-11-29 MED ORDER — LIDOCAINE 2% (20 MG/ML) 5 ML SYRINGE
INTRAMUSCULAR | Status: DC | PRN
  Administered 2017-11-29: 80 mg via INTRAVENOUS

## 2017-11-29 MED ORDER — ACETAMINOPHEN 10 MG/ML IV SOLN
INTRAVENOUS | Status: AC
  Filled 2017-11-29: qty 100

## 2017-11-29 MED ORDER — LIDOCAINE 2% (20 MG/ML) 5 ML SYRINGE
INTRAMUSCULAR | Status: AC
  Filled 2017-11-29: qty 5

## 2017-11-29 MED ORDER — PROPOFOL 10 MG/ML IV BOLUS
INTRAVENOUS | Status: DC | PRN
  Administered 2017-11-29: 200 mg via INTRAVENOUS

## 2017-11-29 MED ORDER — PROPOFOL 10 MG/ML IV BOLUS
INTRAVENOUS | Status: AC
  Filled 2017-11-29: qty 20

## 2017-11-29 MED ORDER — EPHEDRINE 5 MG/ML INJ
INTRAVENOUS | Status: AC
  Filled 2017-11-29: qty 10

## 2017-11-29 MED ORDER — SODIUM CHLORIDE 0.9 % IR SOLN
Status: DC | PRN
  Administered 2017-11-29: 3000 mL

## 2017-11-29 MED ORDER — PHENAZOPYRIDINE HCL 200 MG PO TABS: 200.0000 mg | ORAL_TABLET | Freq: Three times a day (TID) | ORAL | 0 refills | Status: DC | PRN

## 2017-11-29 MED ORDER — ONDANSETRON HCL 4 MG/2ML IJ SOLN
INTRAMUSCULAR | Status: DC | PRN
  Administered 2017-11-29: 4 mg via INTRAVENOUS

## 2017-11-29 MED ORDER — DEXAMETHASONE SODIUM PHOSPHATE 10 MG/ML IJ SOLN
INTRAMUSCULAR | Status: DC | PRN
  Administered 2017-11-29: 8 mg via INTRAVENOUS

## 2017-11-29 SURGICAL SUPPLY — 25 items
BAG URO CATCHER STRL LF (MISCELLANEOUS) ×2 IMPLANT
BASKET ZERO TIP 1.9FR (BASKET) IMPLANT
BASKET ZERO TIP NITINOL 2.4FR (BASKET) IMPLANT
BSKT STON RTRVL ZERO TP 2.4FR (BASKET)
CATH INTERMIT  6FR 70CM (CATHETERS) ×2 IMPLANT
CATH URET 5FR 28IN OPEN ENDED (CATHETERS) ×2 IMPLANT
CATH URET DUAL LUMEN 6-10FR 50 (CATHETERS) ×2 IMPLANT
CLOTH BEACON ORANGE TIMEOUT ST (SAFETY) ×2 IMPLANT
COVER WAND RF STERILE (DRAPES) IMPLANT
EXTRACTOR STONE 1.7FRX115CM (UROLOGICAL SUPPLIES) IMPLANT
GLOVE BIOGEL M 8.0 STRL (GLOVE) ×2 IMPLANT
GOWN STRL REUS W/TWL XL LVL3 (GOWN DISPOSABLE) ×2 IMPLANT
GUIDEWIRE ANG ZIPWIRE 038X150 (WIRE) ×2 IMPLANT
GUIDEWIRE STR DUAL SENSOR (WIRE) ×2 IMPLANT
GUIDEWIRE ZIPWRE .038 STRAIGHT (WIRE) ×2 IMPLANT
IV NS 1000ML (IV SOLUTION) ×1
IV NS 1000ML BAXH (IV SOLUTION) ×1 IMPLANT
MANIFOLD NEPTUNE II (INSTRUMENTS) ×2 IMPLANT
PACK CYSTO (CUSTOM PROCEDURE TRAY) ×2 IMPLANT
SHEATH URETERAL 12FRX28CM (UROLOGICAL SUPPLIES) ×2 IMPLANT
SHEATH URETERAL 12FRX35CM (MISCELLANEOUS) IMPLANT
STENT CONTOUR 6FRX26X.038 (STENTS) ×2 IMPLANT
TUBING CONNECTING 10 (TUBING) ×2 IMPLANT
TUBING UROLOGY SET (TUBING) ×2 IMPLANT
Zipwire IMPLANT

## 2017-11-29 NOTE — Anesthesia Procedure Notes (Signed)
Procedure Name: LMA Insertion Date/Time: 11/29/2017 9:04 AM Performed by: Victoriano Lain, CRNA Pre-anesthesia Checklist: Patient identified, Emergency Drugs available, Suction available, Patient being monitored and Timeout performed Patient Re-evaluated:Patient Re-evaluated prior to induction Oxygen Delivery Method: Circle system utilized Preoxygenation: Pre-oxygenation with 100% oxygen Induction Type: IV induction LMA: LMA with gastric port inserted LMA Size: 4.0 Number of attempts: 1 Placement Confirmation: positive ETCO2 and breath sounds checked- equal and bilateral Tube secured with: Tape Dental Injury: Teeth and Oropharynx as per pre-operative assessment

## 2017-11-29 NOTE — Op Note (Signed)
PATIENT:  David [name]  PRE-OPERATIVE DIAGNOSIS: Right ureteral calculi  POST-OPERATIVE DIAGNOSIS: 1.  Right ureteral calculi. 2.  Right ureteral stricture  PROCEDURE: 1.  Cystoscopy with right retrograde pyelogram including interpretation. 2.  Right ureteroscopy, laser lithotripsy and stent placement  Findings: Stone fragments in the mid right ureter were seen by retrograde with little contrast seen proximal to the stones. He was found to have some stricturing of the ureter in the area where the stones had been located.  My hope is that this is inflammatory in nature due to the duration of the stone had been present and with stenting this will resolve. There did appear to be some stone fragments that had migrated outside of the ureter which may still be visible on radiographic imaging.  SURGEON:  Claybon Jabs  INDICATION: David [name] is a 72 year old male who initially underwent right ureteroscopy and laser lithotripsy with dusting of of the right ureteral stone on 09/24/2017.  His stent was removed the following week and he noted he was still having some hematuria.  He passed some small fragments which were sent for compositional analysis and found to be calcium oxalate.  He did however appear to have persistent fragments in his right ureter and these were followed but failed to pass therefore an ultrasound was obtained that revealed right hydronephrosis and I have suggested proceeding with repeat ureteroscopic management of his stones. ANESTHESIA:  General  EBL:  Minimal  DRAINS: 6 French, 26 cm double-J stent in the right ureter (no string)  LOCAL MEDICATIONS USED:  None  SPECIMEN: None  Description of procedure: After informed consent the patient was taken to the operating room and placed on the table in a supine position. General anesthesia was then administered. Once fully anesthetized the patient was moved to the dorsal lithotomy position and the genitalia were sterilely  prepped and draped in standard fashion. An official timeout was then performed.  Initially the 59 French cystoscope with 30 degree lens was passed under direct vision down the urethra which was noted to be normal.  The bladder was free of any tumors, stones or lesions.  The right and left UO were noted to be normal.  I then advanced a 6 [place] open-ended ureteral catheter through the cystoscope but had some difficulty advancing it into the right UO.  I therefore passed a 0.038 inch sensor guidewire through the open-ended catheter and was able to gain access into the distal ureter.  I then passed the open-ended catheter over this and then remove the guidewire and performed a retrograde pyelogram.  Right retrograde pyelogram was performed by injecting full-strength Omnipaque contrast through the 6 [place] open-ended catheter and up the right ureter.  The distal ureter appeared normal and without filling defect.  At the mid ureter just above the SI joint I noted filling defects consistent with the known stones but was unable to get any contrast to go pass the stones indicating significant obstruction.  I reinserted the guidewire to the level of the stones and tried to advance it beyond the stones with gentle manipulation but was unable.  I therefore passed a dual-lumen ureteral catheter over the initial guidewire and then used a zip wire as a second guidewire to try to access the ureter above the stones and with some manipulation was eventually successful.  I was able to advance the guidewire into the area the renal pelvis by fluoroscopy.  I then left the second guidewire position just below the stones  and remove the dual-lumen catheter.  I proceeded with ureteroscopy using a 6 French rigid dual lumen ureteroscope and passed this over the working guidewire up to the level of the stones.  I was able to identify the stones and noted a very tight stricturing of the ureter that made it difficult to advance the  ureteroscope beyond this level but I was able to pass a guidewire through the ureteroscope and was able to manipulated beyond the stones into the area of the renal pelvis as well as confirmed by fluoroscopy.  I was able to identify a fairly large stone and due to the presence of relative stricture in the ureter I elected to treat this with the holmium laser.  A 200 m holmium laser fiber was chosen and I was able to use this to fragment the stone into much smaller fragments.  At one point it appeared there were some fragments outside of the ureter although I was able to advance the scope next to the guidewire all the way to the area the renal pelvis and noted intact mucosa throughout the length.  With the guidewire confirmed to be in the renal pelvis both visually and fluoroscopically I advanced the scope back down the ureter and noted no significant stone fragments within the ureter but the area of stricturing appeared somewhat tight to movement of the scope.  I was able to visualize the guidewire all the way down the ureter with intact mucosa the entire way and therefore removed the ureteroscope leaving the guidewire in place.  The cystoscope was then backloaded over the guidewire and a 6 [place], 26 cm double-J stent was then passed over the guidewire and into the area the renal pelvis.  As I remove the guidewire I noted good curl in the renal pelvis and in the bladder.  The bladder was then drained through the cystoscope and the cystoscope was removed and the patient was awakened and taken to the recovery room in stable and satisfactory condition.  He tolerated procedure well with no intraoperative complications.  PLAN OF CARE: Discharge to home after PACU  PATIENT DISPOSITION:  PACU - hemodynamically stable.

## 2017-11-29 NOTE — Anesthesia Postprocedure Evaluation (Signed)
Anesthesia Post Note  Patient: David [name]  Procedure(s) Performed: CYSTOSCOPY/RETROGRADE/URETEROSCOPY/HOLMIUM LASER/STENT PLACEMENT (Right )     Patient location during evaluation: PACU Anesthesia Type: General Level of consciousness: sedated Pain management: pain level controlled Vital Signs Assessment: post-procedure vital signs reviewed and stable Respiratory status: spontaneous breathing and respiratory function stable Cardiovascular status: stable Postop Assessment: no apparent nausea or vomiting Anesthetic complications: no    Last Vitals:  Vitals:   11/29/17 1030 11/29/17 1045  BP: (!) 128/91 139/71  Pulse:    Resp:    Temp:  36.6 C  SpO2:      Last Pain:  Vitals:   11/29/17 1045  TempSrc:   PainSc: 0-No pain                 Latania Bascomb DANIEL

## 2017-11-29 NOTE — Transfer of Care (Signed)
Immediate Anesthesia Transfer of Care Note  Patient: David [name]  Procedure(s) Performed: CYSTOSCOPY/RETROGRADE/URETEROSCOPY/HOLMIUM LASER/STENT PLACEMENT (Right )  Patient Location: PACU  Anesthesia Type:General  Level of Consciousness: awake, alert , oriented and patient cooperative  Airway & Oxygen Therapy: Patient Spontanous Breathing and Patient connected to face mask oxygen  Post-op Assessment: Report given to RN, Post -op Vital signs reviewed and stable and Patient moving all extremities  Post vital signs: Reviewed and stable  Last Vitals:  Vitals Value Taken Time  BP 121/102 11/29/2017 10:20 AM  Temp    Pulse 67 11/29/2017 10:22 AM  Resp 17 11/29/2017 10:22 AM  SpO2 100 % 11/29/2017 10:22 AM  Vitals shown include unvalidated device data.  Last Pain:  Vitals:   11/29/17 0701  TempSrc: Oral         Complications: No apparent anesthesia complications

## 2017-11-30 ENCOUNTER — Encounter: Payer: Self-pay | Admitting: Family Medicine

## 2017-11-30 ENCOUNTER — Ambulatory Visit: Payer: Medicare Other | Admitting: Family Medicine

## 2017-11-30 VITALS — BP 134/69 | HR 72 | Temp 98.2°F | Resp 18 | Ht 72.0 in | Wt 241.0 lb

## 2017-11-30 DIAGNOSIS — R52 Pain, unspecified: Secondary | ICD-10-CM

## 2017-11-30 DIAGNOSIS — N200 Calculus of kidney: Secondary | ICD-10-CM | POA: Diagnosis not present

## 2017-11-30 DIAGNOSIS — K5900 Constipation, unspecified: Secondary | ICD-10-CM

## 2017-11-30 DIAGNOSIS — I480 Paroxysmal atrial fibrillation: Secondary | ICD-10-CM

## 2017-11-30 DIAGNOSIS — N289 Disorder of kidney and ureter, unspecified: Secondary | ICD-10-CM | POA: Diagnosis not present

## 2017-11-30 DIAGNOSIS — F418 Other specified anxiety disorders: Secondary | ICD-10-CM

## 2017-11-30 DIAGNOSIS — R300 Dysuria: Secondary | ICD-10-CM | POA: Diagnosis not present

## 2017-11-30 DIAGNOSIS — R739 Hyperglycemia, unspecified: Secondary | ICD-10-CM

## 2017-11-30 LAB — CBC WITH DIFFERENTIAL/PLATELET
BASOS ABS: 0 10*3/uL (ref 0.0–0.1)
Basophils Relative: 0.1 % (ref 0.0–3.0)
EOS ABS: 0 10*3/uL (ref 0.0–0.7)
Eosinophils Relative: 0 % (ref 0.0–5.0)
HCT: 39.7 % (ref 39.0–52.0)
Hemoglobin: 13.2 g/dL (ref 13.0–17.0)
LYMPHS ABS: 1.1 10*3/uL (ref 0.7–4.0)
Lymphocytes Relative: 7.9 % — ABNORMAL LOW (ref 12.0–46.0)
MCHC: 33.2 g/dL (ref 30.0–36.0)
MCV: 92.3 fl (ref 78.0–100.0)
Monocytes Absolute: 0.8 10*3/uL (ref 0.1–1.0)
Monocytes Relative: 6.3 % (ref 3.0–12.0)
NEUTROS ABS: 11.4 10*3/uL — AB (ref 1.4–7.7)
NEUTROS PCT: 85.7 % — AB (ref 43.0–77.0)
PLATELETS: 232 10*3/uL (ref 150.0–400.0)
RBC: 4.3 Mil/uL (ref 4.22–5.81)
RDW: 14.5 % (ref 11.5–15.5)
WBC: 13.3 10*3/uL — ABNORMAL HIGH (ref 4.0–10.5)

## 2017-11-30 LAB — COMPREHENSIVE METABOLIC PANEL
ALT: 14 U/L (ref 0–53)
AST: 14 U/L (ref 0–37)
Albumin: 4.3 g/dL (ref 3.5–5.2)
Alkaline Phosphatase: 74 U/L (ref 39–117)
BILIRUBIN TOTAL: 0.5 mg/dL (ref 0.2–1.2)
BUN: 21 mg/dL (ref 6–23)
CO2: 26 meq/L (ref 19–32)
CREATININE: 2.13 mg/dL — AB (ref 0.40–1.50)
Calcium: 10.3 mg/dL (ref 8.4–10.5)
Chloride: 104 mEq/L (ref 96–112)
GFR: 32.57 mL/min — AB (ref >60.00)
GLUCOSE: 114 mg/dL — AB (ref 70–99)
Potassium: 4.3 mEq/L (ref 3.5–5.1)
Sodium: 138 mEq/L (ref 135–145)
Total Protein: 6.7 g/dL (ref 6.0–8.3)

## 2017-11-30 LAB — URINALYSIS, ROUTINE W REFLEX MICROSCOPIC
BILIRUBIN URINE: NEGATIVE
KETONES UR: NEGATIVE
SPECIFIC GRAVITY, URINE: 1.015 (ref 1.000–1.030)
Total Protein, Urine: 30 — AB
UROBILINOGEN UA: 1 (ref 0.0–1.0)
Urine Glucose: NEGATIVE
pH: 5.5 (ref 5.0–8.0)

## 2017-11-30 LAB — POC URINALSYSI DIPSTICK (AUTOMATED)
GLUCOSE UA: NEGATIVE
Ketones, UA: NEGATIVE
NITRITE UA: POSITIVE
Protein, UA: POSITIVE — AB
Spec Grav, UA: 1.02 (ref 1.010–1.025)
Urobilinogen, UA: 1 E.U./dL
pH, UA: 6 (ref 5.0–8.0)

## 2017-11-30 MED ORDER — KETOROLAC TROMETHAMINE 30 MG/ML IJ SOLN
30.0000 mg | Freq: Once | INTRAMUSCULAR | Status: AC
  Administered 2017-11-30: 30 mg via INTRAMUSCULAR

## 2017-11-30 MED ORDER — KETOROLAC TROMETHAMINE 30 MG/ML IJ SOLN: 30.0000 mg | Freq: Once | INTRAMUSCULAR | Status: DC

## 2017-11-30 MED ORDER — TRAMADOL HCL 50 MG PO TABS: 50.0000 mg | ORAL_TABLET | Freq: Four times a day (QID) | ORAL | 0 refills | Status: DC | PRN

## 2017-11-30 NOTE — Assessment & Plan Note (Signed)
Had a scope yesterday with Dr Karsten Ro of [facility] yesterday has had some mild dysuria. Check CBC and urinalysis and culture

## 2017-11-30 NOTE — Assessment & Plan Note (Signed)
Encouraged increased hydration and fiber in diet. Daily probiotics. If bowels not moving can use MOM 2 tbls po in 4 oz of warm prune juice by mouth every 2-3 days. If no results then repeat in 4 hours with  Dulcolax suppository pr, may repeat again in 4 more hours as needed. Seek care if symptoms worsen. Consider daily Miralax and/or Dulcolax if symptoms persist. Try Miralax with Benefiber once to twice daily

## 2017-11-30 NOTE — Patient Instructions (Addendum)
Tylenol ES 500 mg tabs, 1-2 tabs twice daily as needed for moderate pain Advil 200 mg tabs 1-2 tabs twice daily as needed for moderate pain   Encouraged increased hydration and fiber in diet. Daily probiotics. If bowels not moving can use MOM 2 tbls po in 4 oz of warm prune juice by mouth every 2-3 days. If no results then repeat in 4 hours with  Dulcolax suppository pr, may repeat again in 4 more hours as needed. Seek care if symptoms worsen. Consider daily Miralax and/or Dulcolax if symptoms persist.   Miralax with Benefiber twice daily  Kidney Stones Kidney stones (urolithiasis) are rock-like masses that form inside of the kidneys. Kidneys are organs that make pee (urine). A kidney stone can cause very bad pain and can block the flow of pee. The stone usually leaves your body (passes) through your pee. You may need to have a doctor take out the stone. Follow these instructions at home: Eating and drinking  Drink enough fluid to keep your pee clear or pale yellow. This will help you pass the stone.  If told by your doctor, change the foods you eat (your diet). This may include: ? Limiting how much salt (sodium) you eat. ? Eating more fruits and vegetables. ? Limiting how much meat, poultry, fish, and eggs you eat.  Follow instructions from your doctor about eating or drinking restrictions. General instructions  Collect pee samples as told by your doctor. You may need to collect a pee sample: ? 24 hours after a stone comes out. ? 8-12 weeks after a stone comes out, and every 6-12 months after that.  Strain your pee every time you pee (urinate), for as long as told. Use the strainer that your doctor recommends.  Do not throw out the stone. Keep it so that it can be tested by your doctor.  Take over-the-counter and prescription medicines only as told by your doctor.  Keep all follow-up visits as told by your doctor. This is important. You may need follow-up tests. Preventing kidney  stones To prevent another kidney stone:  Drink enough fluid to keep your pee clear or pale yellow. This is the best way to prevent kidney stones.  Eat healthy foods.  Avoid certain foods as told by your doctor. You may be told to eat less protein.  Stay at a healthy weight.  Contact a doctor if:  You have pain that gets worse or does not get better with medicine. Get help right away if:  You have a fever or chills.  You get very bad pain.  You get new pain in your belly (abdomen).  You pass out (faint).  You cannot pee. This information is not intended to replace advice given to you by your health care provider. Make sure you discuss any questions you have with your health care provider. Document Released: 06/17/2007 Document Revised: 09/17/2015 Document Reviewed: 09/17/2015 Elsevier Interactive Patient Education  2017 [employer].

## 2017-11-30 NOTE — Assessment & Plan Note (Signed)
Stopped Xarelto per cardiology. They will restart once kidney stones are managed

## 2017-11-30 NOTE — Assessment & Plan Note (Signed)
Repeat CMP

## 2017-11-30 NOTE — Assessment & Plan Note (Signed)
Check cmp today 

## 2017-11-30 NOTE — Progress Notes (Signed)
Subjective:    Patient ID: David [name], male    DOB: Jun 07, 1945, 72 y.o.   MRN: [id number]  Chief Complaint  Patient presents with  . Follow-up    Kidney Blockage    HPI Patient is in today for evaluation of urinary symptoms, he had a urologic procedure for his kidney stones. He notes flank pain and some chills. No GI symptoms such as nausea or vomiting. He notes malaise and fatigue. He continues to struggle with anxiety and anhedonia but no suicidal ideation. Denies CP/palp/SOB/HA/congestion/fevers/GI c/o. Taking meds as prescribed  Past Medical History:  Diagnosis Date  . Abnormal liver function 03/18/2013  . Adenoma of right adrenal gland 07/11/2002   2.4cm , noted on CT ABD  . Anxiety   . Arthritis of both knees 03/26/2016  . Astigmatism   . Atrial fibrillation ([place])    x1 successful cardioversions  . Back pain   . BCC (basal cell carcinoma of skin)    under right eye and right ear  . Cataract 09/29/2016  . Depression   . Diverticulitis   . Diverticulosis   . Fatty liver   . GERD (gastroesophageal reflux disease)   . Gout   . Hearing loss of both ears 03/26/2016  . History of chronic prostatitis    started at age 56  . History of kidney stones   . Hyperglycemia 03/29/2016  . Hyperlipidemia   . Hypertension   . IBS (irritable bowel syndrome) 03/18/2013  . Incomplete right bundle branch block (RBBB)   . Internal hemorrhoids   . Kidney lesion 06/07/2015   Right midportion, 1.1x1.1 cm hyperechoic, noted on [place] ABD  . LAFB (left anterior fascicular block)   . Lipoma of axilla 09/2016  . Liver lesion, right lobe 06/07/2015   2.5x2.4x2.4 cm hypoechoic lesion posterior aspect, noted on [place] ABD  . Low back pain 03/26/2016  . LVH (left ventricular hypertrophy) 08/06/2017   Moderate, noted on ECHO  . Medicare annual wellness visit, subsequent 03/12/2014  . Muscle cramps   . Nocturia   . OA (osteoarthritis)    Back, Hands, Neck  . Obesity 11/25/2007   Qualifier: Diagnosis  of  By: Lenna Gilford MD, Deborra Medina   . Other malaise and fatigue 03/13/2013  . Premature ventricular complex   . Preventative health care 03/07/2015  . Renal insufficiency   . Scoliosis    Upper thoracic and lumbar  . Vitamin D deficiency    resolved    Past Surgical History:  Procedure Laterality Date  . CARDIOVERSION  07/31/2017  . CATARACT EXTRACTION, BILATERAL    . COLON SURGERY  2009   segmental sigmoid resection  . COLONOSCOPY    . HOLMIUM LASER APPLICATION Right 5/32/9924   Procedure: HOLMIUM LASER APPLICATION;  Surgeon: Kathie Rhodes, MD;  Location: [facility];  Service: Urology;  Laterality: Right;  . NOSE SURGERY     Submucous resection age 20  . NUCLEAR STRESS TEST  06/03/2009  . SKIN BIOPSY    . URETEROSCOPY WITH HOLMIUM LASER LITHOTRIPSY Right 09/24/2017   Procedure: CYSTOSCOPY, URETEROSCOPY WITH HOLMIUM LASER LITHOTRIPSY, STENT PLACEMENT;  Surgeon: Kathie Rhodes, MD;  [facility];  Service: Urology;  Laterality: Right;    Family History  Problem Relation Age of Onset  . Heart disease Mother   . Hypertension Mother   . Stroke Mother   . Colon cancer Mother   . Breast cancer Mother   . Heart disease Father  pacemaker  . Aortic aneurysm Father   . Hypertension Father   . Heart disease Sister   . Atrial fibrillation Sister   . Obesity Sister   . Sleep apnea Sister   . Heart attack Brother   . Other Brother        muscle disease  . Arthritis Brother   . Stroke Brother   . Atrial fibrillation Brother   . Cancer Maternal Grandmother        ?  Marland Kitchen Heart attack Maternal Grandmother   . Diabetes Maternal Grandmother   . Cancer Maternal Grandfather        hodgin's lymphoma  . Heart attack Paternal Grandmother   . Anxiety disorder Paternal Grandmother   . Pneumonia Paternal Grandfather   . Heart attack Brother   . Atrial fibrillation Brother   . Atrial fibrillation Brother   . Heart attack Brother   . Hypertension Brother    . Hyperlipidemia Brother   . Heart attack Brother   . Other Brother        heart valve operation  . Atrial fibrillation Brother     Social History   Socioeconomic History  . Marital status: Married    Spouse name: Not on file  . Number of children: 3  . Years of education: Not on file  . Highest education level: Not on file  Occupational History  . Occupation: Nurse, adult: [employer]  . Financial resource strain: Not on file  . Food insecurity:    Worry: Not on file    Inability: Not on file  . Transportation needs:    Medical: Not on file    Non-medical: Not on file  Tobacco Use  . Smoking status: Former Smoker    Packs/day: 1.00    Years: 6.00    Pack years: 6.00    Types: Cigarettes    Start date: 01/12/1969  . Smokeless tobacco: Never Used  . Tobacco comment: [id number]  Substance and Sexual Activity  . Alcohol use: Not Currently    Comment: 2 drinks 5 nights a week   . Drug use: No  . Sexual activity: Yes  Lifestyle  . Physical activity:    Days per week: Not on file    Minutes per session: Not on file  . Stress: Not on file  Relationships  . Social connections:    Talks on phone: Not on file    Gets together: Not on file    Attends religious service: Not on file    Active member of club or organization: Not on file    Attends meetings of clubs or organizations: Not on file    Relationship status: Not on file  . Intimate partner violence:    Fear of current or ex partner: Not on file    Emotionally abused: Not on file    Physically abused: Not on file    Forced sexual activity: Not on file  Other Topics Concern  . Not on file  Social History Narrative   The patient is married for the second time, he has 3 sons.   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   2 alcoholic beverages most days.   1 caffeinated beverage daily   No drug use no current tobacco use he is a prior smoker   12/24/2016    Outpatient Medications Prior to Visit    Medication Sig Dispense Refill  . acetaminophen (TYLENOL) 325 MG tablet Take 650 mg  by mouth every 6 (six) hours as needed for moderate pain.     Marland Kitchen alum hydroxide-mag trisilicate (GAVISCON) 78-29 MG CHEW Chew 1 tablet by mouth daily as needed for indigestion or heartburn.     Marland Kitchen atenolol (TENORMIN) 25 MG tablet TAKE ONE TABLET BY MOUTH TWICE A DAY (Patient taking differently: Take 25 mg by mouth 2 (two) times daily. ) 180 tablet 2  . carboxymethylcellulose (REFRESH PLUS) 0.5 % SOLN Place 1 drop into both eyes daily.    . Colchicine 0.6 MG CAPS 1 tab po bid (Patient taking differently: Take 0.6 mg by mouth daily as needed (gout). 1 tab po bid) 60 capsule 0  . cyclobenzaprine (FLEXERIL) 5 MG tablet Take 1 tablet (5 mg total) by mouth 3 (three) times daily as needed for muscle spasms. 30 tablet 1  . fluticasone (FLONASE) 50 MCG/ACT nasal spray Place 2 sprays into the nose at bedtime. For congestion    . HYDROcodone-acetaminophen (NORCO) 10-325 MG tablet Take 1-2 tablets by mouth every 4 (four) hours as needed for moderate pain. Maximum dose per 24 hours - 8 pills 18 tablet 0  . irbesartan (AVAPRO) 150 MG tablet TAKE ONE TABLET BY MOUTH DAILY (Patient taking differently: Take 75 mg by mouth daily. ) 63 tablet 0  . LORazepam (ATIVAN) 1 MG tablet Take 0.5 tablets (0.5 mg total) by mouth 2 (two) times daily as needed. 90 tablet 1  . Menthol, Topical Analgesic, (ICY HOT BACK EX) Apply 1 application topically daily as needed (back pain).    Marland Kitchen omeprazole (PRILOSEC) 20 MG capsule TAKE ONE CAPSULE BY MOUTH DAILY (Patient taking differently: Take 20 mg by mouth daily as needed (heartburn). ) 30 capsule 4  . phenazopyridine (PYRIDIUM) 200 MG tablet Take 1 tablet (200 mg total) by mouth 3 (three) times daily as needed for pain. 20 tablet 0  . Probiotic Product (PROBIOTIC DAILY PO) Take 1 capsule by mouth daily.     . tamsulosin (FLOMAX) 0.4 MG CAPS capsule Take 0.4 mg by mouth daily after supper.    . rivaroxaban  (XARELTO) 20 MG TABS tablet Take 1 tablet (20 mg total) by mouth daily with supper. 30 tablet 11  . sertraline (ZOLOFT) 50 MG tablet Take 1 tablet (50 mg total) by mouth daily. 30 tablet 3   No facility-administered medications prior to visit.     Allergies  Allergen Reactions  . Cefaclor Hives  . Cephalosporins Hives  . Penicillins Rash    Mild maculopapular rash Has patient had a PCN reaction causing immediate rash, facial/tongue/throat swelling, SOB or lightheadedness with hypotension: No Has patient had a PCN reaction causing severe rash involving mucus membranes or skin necrosis: No Has patient had a PCN reaction that required hospitalization: No Has patient had a PCN reaction occurring within the last 10 years: No If all of the above answers are "NO", then may proceed with Cephalosporin use.     Review of Systems  Constitutional: Negative for fever and malaise/fatigue.  HENT: Negative for congestion.   Eyes: Negative for blurred vision.  Respiratory: Negative for shortness of breath.   Cardiovascular: Negative for chest pain, palpitations and leg swelling.  Gastrointestinal: Positive for abdominal pain and constipation. Negative for blood in stool and nausea.  Genitourinary: Positive for dysuria and frequency.  Musculoskeletal: Negative for falls.  Skin: Negative for rash.  Neurological: Negative for dizziness, loss of consciousness and headaches.  Endo/Heme/Allergies: Negative for environmental allergies.  Psychiatric/Behavioral: Positive for depression. The patient is nervous/anxious.  Objective:    Physical Exam  Constitutional: He is oriented to person, place, and time. He appears well-developed and well-nourished. No distress.  HENT:  Head: Normocephalic and atraumatic.  Nose: Nose normal.  Eyes: Right eye exhibits no discharge. Left eye exhibits no discharge.  Neck: Normal range of motion. Neck supple.  Cardiovascular: Normal rate and regular rhythm.    No murmur heard. Pulmonary/Chest: Effort normal and breath sounds normal.  Abdominal: Soft. Bowel sounds are normal. There is no tenderness.  Musculoskeletal: He exhibits no edema.  Neurological: He is alert and oriented to person, place, and time.  Skin: Skin is warm and dry.  Psychiatric: He has a normal mood and affect.  Nursing note and vitals reviewed.   BP 134/69 (BP Location: Left Arm, Patient Position: Sitting, Cuff Size: Large)   Pulse 72   Temp 98.2 F (36.8 C)   Resp 18   Ht 6' (1.829 m)   Wt 241 lb (109.3 kg)   SpO2 95%   BMI 32.69 kg/m  Wt Readings from Last 3 Encounters:  11/30/17 241 lb (109.3 kg)  11/29/17 236 lb 4 oz (107.2 kg)  11/15/17 236 lb (107 kg)     Lab Results  Component Value Date   WBC 6.9 11/15/2017   HGB 13.9 11/15/2017   HCT 41.5 11/15/2017   PLT 229.0 11/15/2017   GLUCOSE 91 11/15/2017   CHOL 202 (H) 10/08/2017   TRIG 94.0 10/08/2017   HDL 33.00 (L) 10/08/2017   LDLDIRECT 168.5 03/29/2006   LDLCALC 151 (H) 10/08/2017   ALT 17 11/15/2017   AST 16 11/15/2017   NA 139 11/15/2017   K 4.4 11/15/2017   CL 104 11/15/2017   CREATININE 2.04 (H) 11/15/2017   BUN 20 11/15/2017   CO2 25 11/15/2017   TSH 3.05 10/08/2017   PSA 3.36 11/15/2017   INR 1.39 11/14/2010   HGBA1C 5.7 10/08/2017    Lab Results  Component Value Date   TSH 3.05 10/08/2017   Lab Results  Component Value Date   WBC 6.9 11/15/2017   HGB 13.9 11/15/2017   HCT 41.5 11/15/2017   MCV 92.7 11/15/2017   PLT 229.0 11/15/2017   Lab Results  Component Value Date   NA 139 11/15/2017   K 4.4 11/15/2017   CO2 25 11/15/2017   GLUCOSE 91 11/15/2017   BUN 20 11/15/2017   CREATININE 2.04 (H) 11/15/2017   BILITOT 0.8 11/15/2017   ALKPHOS 70 11/15/2017   AST 16 11/15/2017   ALT 17 11/15/2017   PROT 6.8 11/15/2017   ALBUMIN 4.3 11/15/2017   CALCIUM 10.3 11/15/2017   ANIONGAP 8 10/03/2017   GFR 34.24 (L) 11/15/2017   Lab Results  Component Value Date   CHOL 202  (H) 10/08/2017   Lab Results  Component Value Date   HDL 33.00 (L) 10/08/2017   Lab Results  Component Value Date   LDLCALC 151 (H) 10/08/2017   Lab Results  Component Value Date   TRIG 94.0 10/08/2017   Lab Results  Component Value Date   CHOLHDL 6 10/08/2017   Lab Results  Component Value Date   HGBA1C 5.7 10/08/2017       Assessment & Plan:   Problem List Items Addressed This Visit    Atrial fibrillation ([place])    Stopped Xarelto per cardiology. They will restart once kidney stones are managed      Renal insufficiency    Repeat CMP      Relevant Orders  CBC w/Diff   Comprehensive metabolic panel   Depression with anxiety    Has chosen not to take the Zoloft so far. He will still consider in the future if it worsens       Constipation    Encouraged increased hydration and fiber in diet. Daily probiotics. If bowels not moving can use MOM 2 tbls po in 4 oz of warm prune juice by mouth every 2-3 days. If no results then repeat in 4 hours with  Dulcolax suppository pr, may repeat again in 4 more hours as needed. Seek care if symptoms worsen. Consider daily Miralax and/or Dulcolax if symptoms persist. Try Miralax with Benefiber once to twice daily      Hyperglycemia    Check cmp today      Kidney stone    Had a scope yesterday with Dr Karsten Ro of [facility] yesterday has had some mild dysuria. Check CBC and urinalysis and culture      Relevant Medications   traMADol (ULTRAM) 50 MG tablet    Other Visit Diagnoses    Pain    -  Primary   Relevant Medications   ketorolac (TORADOL) 30 MG/ML injection 30 mg   Other Relevant Orders   CBC w/Diff   Renal stone       Relevant Medications   traMADol (ULTRAM) 50 MG tablet   Other Relevant Orders   CBC w/Diff   Dysuria       Relevant Orders   Urine Culture   Urinalysis      I have discontinued David [name] "Fred"'s sertraline and rivaroxaban. I am also having him start on traMADol. Additionally, I  am having him maintain his fluticasone, alum hydroxide-mag trisilicate, cyclobenzaprine, omeprazole, atenolol, Colchicine, acetaminophen, tamsulosin, LORazepam, irbesartan, carboxymethylcellulose, (Menthol, Topical Analgesic, (ICY HOT BACK EX)), Probiotic Product (PROBIOTIC DAILY PO), HYDROcodone-acetaminophen, and phenazopyridine. We will continue to administer ketorolac.  Meds ordered this encounter  Medications  . traMADol (ULTRAM) 50 MG tablet    Sig: Take 1 tablet (50 mg total) by mouth every 6 (six) hours as needed.    Dispense:  20 tablet    Refill:  0  . ketorolac (TORADOL) 30 MG/ML injection 30 mg     Penni Homans, MD

## 2017-11-30 NOTE — Assessment & Plan Note (Signed)
Has chosen not to take the Zoloft so far. He will still consider in the future if it worsens

## 2017-12-01 LAB — URINE CULTURE
MICRO NUMBER:: 91392292
Result:: NO GROWTH
SPECIMEN QUALITY:: ADEQUATE

## 2017-12-02 ENCOUNTER — Encounter: Payer: Self-pay | Admitting: Family Medicine

## 2017-12-03 ENCOUNTER — Ambulatory Visit (INDEPENDENT_AMBULATORY_CARE_PROVIDER_SITE_OTHER): Payer: Medicare Other | Admitting: Family Medicine

## 2017-12-03 ENCOUNTER — Encounter: Payer: Self-pay | Admitting: Family Medicine

## 2017-12-03 VITALS — BP 118/76 | HR 61 | Temp 97.8°F | Ht 72.0 in | Wt 235.4 lb

## 2017-12-03 DIAGNOSIS — G8918 Other acute postprocedural pain: Secondary | ICD-10-CM

## 2017-12-03 DIAGNOSIS — N183 Chronic kidney disease, stage 3 unspecified: Secondary | ICD-10-CM

## 2017-12-03 NOTE — Patient Instructions (Addendum)
Stay hydrated.  You do not need to be fasting for your next lab visit.  Let us know if you need anything.

## 2017-12-03 NOTE — Progress Notes (Signed)
Chief Complaint  Patient presents with  . Follow-up    Subjective: Patient is a 72 y.o. male here for post procedure questions.  Patient had second kidney stone retrieval done on 11/29/2017.  He was not pleased with the follow-up care he received from his urologist.  He saw his regular PCP the following day.  Urine culture and lab work was obtained.  Urine culture is unremarkable.  He is wondering if he needs to be an antibiotic.  His pain is improving.  He has white count and also has been having worsening creatinine.  He is wondering what needs to be done at this point.  Denies any fevers, urinary complaints, abdominal pain, or cough.   ROS: Const: No fevers  Past Medical History:  Diagnosis Date  . Abnormal liver function 03/18/2013  . Adenoma of right adrenal gland 07/11/2002   2.4cm , noted on CT ABD  . Anxiety   . Arthritis of both knees 03/26/2016  . Astigmatism   . Atrial fibrillation ([place])    x1 successful cardioversions  . Back pain   . BCC (basal cell carcinoma of skin)    under right eye and right ear  . Cataract 09/29/2016  . Depression   . Diverticulitis   . Diverticulosis   . Fatty liver   . GERD (gastroesophageal reflux disease)   . Gout   . Hearing loss of both ears 03/26/2016  . History of chronic prostatitis    started at age 89  . History of kidney stones   . Hyperglycemia 03/29/2016  . Hyperlipidemia   . Hypertension   . IBS (irritable bowel syndrome) 03/18/2013  . Incomplete right bundle branch block (RBBB)   . Internal hemorrhoids   . Kidney lesion 06/07/2015   Right midportion, 1.1x1.1 cm hyperechoic, noted on [place] ABD  . LAFB (left anterior fascicular block)   . Lipoma of axilla 09/2016  . Liver lesion, right lobe 06/07/2015   2.5x2.4x2.4 cm hypoechoic lesion posterior aspect, noted on [place] ABD  . Low back pain 03/26/2016  . LVH (left ventricular hypertrophy) 08/06/2017   Moderate, noted on ECHO  . Medicare annual wellness visit, subsequent 03/12/2014   . Muscle cramps   . Nocturia   . OA (osteoarthritis)    Back, Hands, Neck  . Obesity 11/25/2007   Qualifier: Diagnosis of  By: Lenna Gilford MD, Deborra Medina   . Other malaise and fatigue 03/13/2013  . Premature ventricular complex   . Preventative health care 03/07/2015  . Renal insufficiency   . Scoliosis    Upper thoracic and lumbar  . Vitamin D deficiency    resolved    Objective: BP 118/76 (BP Location: Left Arm, Patient Position: Sitting, Cuff Size: Large)   Pulse 61   Temp 97.8 F (36.6 C) (Oral)   Ht 6' (1.829 m)   Wt 235 lb 6 oz (106.8 kg)   SpO2 95%   BMI 31.92 kg/m  General: Awake, appears stated age HEENT: MMM, EOMi Heart: RRR Lungs: CTAB, no rales, wheezes or rhonchi. No accessory muscle use MSK: No flank pain Abd: Bowel sounds present, soft, mildly distended, no tenderness, no masses or organomegaly Psych: Age appropriate judgment and insight, normal affect and mood  Assessment and Plan: CKD (chronic kidney disease) stage 3, GFR 30-59 ml/min (HCC) - Plan: Basic metabolic panel  Post procedure discomfort  Check renal function in 2 weeks.  White count is likely secondary to procedure.  If he clinically improves, I would not check this  again.  He is okay with this decision.  I do not think he needs to be on an antibiotic given his clinical improvement and unremarkable urine testing.  Stay well-hydrated. Follow-up as originally scheduled with his regular PCP. The patient voiced understanding and agreement to the plan.  [place], DO 12/03/17  9:12 AM

## 2017-12-03 NOTE — Progress Notes (Signed)
Pre visit review using our clinic review tool, if applicable. No additional management support is needed unless otherwise documented below in the visit note. 

## 2017-12-05 ENCOUNTER — Other Ambulatory Visit: Payer: Self-pay | Admitting: Family Medicine

## 2017-12-05 MED ORDER — LEVOFLOXACIN 250 MG PO TABS: 250.0000 mg | ORAL_TABLET | ORAL | 0 refills | Status: DC

## 2017-12-15 ENCOUNTER — Ambulatory Visit: Payer: Medicare Other | Admitting: Family Medicine

## 2017-12-17 ENCOUNTER — Ambulatory Visit: Payer: Medicare Other | Admitting: Internal Medicine

## 2017-12-17 ENCOUNTER — Ambulatory Visit (INDEPENDENT_AMBULATORY_CARE_PROVIDER_SITE_OTHER): Payer: Medicare Other | Admitting: Internal Medicine

## 2017-12-17 ENCOUNTER — Other Ambulatory Visit (INDEPENDENT_AMBULATORY_CARE_PROVIDER_SITE_OTHER): Payer: Medicare Other

## 2017-12-17 ENCOUNTER — Encounter: Payer: Self-pay | Admitting: Internal Medicine

## 2017-12-17 VITALS — BP 124/62 | Ht 72.0 in | Wt 232.4 lb

## 2017-12-17 DIAGNOSIS — I4891 Unspecified atrial fibrillation: Secondary | ICD-10-CM

## 2017-12-17 DIAGNOSIS — Z7901 Long term (current) use of anticoagulants: Secondary | ICD-10-CM | POA: Diagnosis not present

## 2017-12-17 DIAGNOSIS — N289 Disorder of kidney and ureter, unspecified: Secondary | ICD-10-CM

## 2017-12-17 DIAGNOSIS — Z1211 Encounter for screening for malignant neoplasm of colon: Secondary | ICD-10-CM

## 2017-12-17 LAB — COMPREHENSIVE METABOLIC PANEL
ALK PHOS: 67 U/L (ref 39–117)
ALT: 16 U/L (ref 0–53)
AST: 18 U/L (ref 0–37)
Albumin: 4.1 g/dL (ref 3.5–5.2)
BUN: 24 mg/dL — AB (ref 6–23)
CHLORIDE: 104 meq/L (ref 96–112)
CO2: 29 mEq/L (ref 19–32)
Calcium: 10.1 mg/dL (ref 8.4–10.5)
Creatinine, Ser: 1.71 mg/dL — ABNORMAL HIGH (ref 0.40–1.50)
GFR: 41.96 mL/min — ABNORMAL LOW (ref >60.00)
GLUCOSE: 104 mg/dL — AB (ref 70–99)
POTASSIUM: 4.2 meq/L (ref 3.5–5.1)
SODIUM: 139 meq/L (ref 135–145)
TOTAL PROTEIN: 6.5 g/dL (ref 6.0–8.3)
Total Bilirubin: 0.8 mg/dL (ref 0.2–1.2)

## 2017-12-17 NOTE — Patient Instructions (Signed)
You have been scheduled for a colonoscopy. Please follow written instructions given to you at your visit today.  Please pick up your prep supplies at the pharmacy within the next 1-3 days. If you use inhalers (even only as needed), please bring them with you on the day of your procedure.   

## 2017-12-17 NOTE — Progress Notes (Signed)
David [name] 72 y.o. 1945-08-17 [id number]  Assessment & Plan:   Encounter Diagnoses  Name Primary?  . Colon cancer screening Yes  . Long term current use of anticoagulant - Xarelto   . Atrial fibrillation, unspecified type [facility])     It is an appropriate time to schedule a colonoscopy.  He is currently off Xarelto at the recommendation of his cardiologist because of bleeding with these kidney stone therapy.  It sounds like he will go back on that after his stent is removed on December 30 but since it is been acceptable to hold the Xarelto in this setting it should be acceptable to hold it 2 days prior to his colonoscopy and restart after in my opinion.  He understands the very rare real risk of stroke in addition to the typical risks of colonoscopy procedure and agrees to proceed.  The risks and benefits as well as alternatives of endoscopic procedure(s) have been discussed and reviewed. All questions answered. The patient agrees to proceed.  At one point he was on a 5-year recall but I am assuming I changed that, his mother had colon cancer but I do not know her exact age we will clarify when he returns though that probably will not make any difference in a recall for him at 72 years of age at this time.  Subjective:   Chief Complaint: Colon cancer screening follow-up due for colonoscopy  HPI Patient is here, he had a negative screening colonoscopy in 2009 and it is time to consider repeating a colonoscopy for screening purposes.  He is not having any active GI symptoms.  He has been having difficulty with kidney stones and Dr. Karsten Ro has treated him for that he has a stent in and has held his Xarelto due to hematuria after lithotripsy and stent placement.  Dr. Percival Spanish authorized this.  Stent is due to come out December 30.  He anticipates restarting Xarelto at that time. Allergies  Allergen Reactions  . Cefaclor Hives  . Cephalosporins Hives  . Penicillins Rash    Mild  maculopapular rash Has patient had a PCN reaction causing immediate rash, facial/tongue/throat swelling, SOB or lightheadedness with hypotension: No Has patient had a PCN reaction causing severe rash involving mucus membranes or skin necrosis: No Has patient had a PCN reaction that required hospitalization: No Has patient had a PCN reaction occurring within the last 10 years: No If all of the above answers are "NO", then may proceed with Cephalosporin use.    Current Meds  Medication Sig  . acetaminophen (TYLENOL) 325 MG tablet Take 650 mg by mouth every 6 (six) hours as needed for moderate pain.   Marland Kitchen alum hydroxide-mag trisilicate (GAVISCON) 78-46 MG CHEW Chew 1 tablet by mouth daily as needed for indigestion or heartburn.   Marland Kitchen atenolol (TENORMIN) 25 MG tablet TAKE ONE TABLET BY MOUTH TWICE A DAY (Patient taking differently: Take 25 mg by mouth 2 (two) times daily. )  . carboxymethylcellulose (REFRESH PLUS) 0.5 % SOLN Place 1 drop into both eyes daily.  . Colchicine 0.6 MG CAPS 1 tab po bid (Patient taking differently: Take 0.6 mg by mouth daily as needed (gout). 1 tab po bid)  . cyclobenzaprine (FLEXERIL) 5 MG tablet Take 1 tablet (5 mg total) by mouth 3 (three) times daily as needed for muscle spasms.  . fluticasone (FLONASE) 50 MCG/ACT nasal spray Place 2 sprays into the nose at bedtime. For congestion  . HYDROcodone-acetaminophen (NORCO) 10-325 MG tablet Take 1-2  tablets by mouth every 4 (four) hours as needed for moderate pain. Maximum dose per 24 hours - 8 pills  . LORazepam (ATIVAN) 1 MG tablet Take 0.5 tablets (0.5 mg total) by mouth 2 (two) times daily as needed.  . Menthol, Topical Analgesic, (ICY HOT BACK EX) Apply 1 application topically daily as needed (back pain).  Marland Kitchen omeprazole (PRILOSEC) 20 MG capsule Take 20 mg by mouth as needed.  . phenazopyridine (PYRIDIUM) 200 MG tablet Take 1 tablet (200 mg total) by mouth 3 (three) times daily as needed for pain.  . Probiotic Product  (PROBIOTIC DAILY PO) Take 1 capsule by mouth daily.   . tamsulosin (FLOMAX) 0.4 MG CAPS capsule Take 0.4 mg by mouth daily after supper.  . traMADol (ULTRAM) 50 MG tablet Take 1 tablet (50 mg total) by mouth every 6 (six) hours as needed.   Past Medical History:  Diagnosis Date  . Abnormal liver function 03/18/2013  . Adenoma of right adrenal gland 07/11/2002   2.4cm , noted on CT ABD  . Anxiety   . Arthritis of both knees 03/26/2016  . Astigmatism   . Atrial fibrillation ([place])    x1 successful cardioversions  . Back pain   . BCC (basal cell carcinoma of skin)    under right eye and right ear  . Cataract 09/29/2016  . Depression   . Diverticulitis   . Diverticulosis   . Fatty liver   . GERD (gastroesophageal reflux disease)   . Gout   . Hearing loss of both ears 03/26/2016  . History of chronic prostatitis    started at age 55  . History of kidney stones   . Hyperglycemia 03/29/2016  . Hyperlipidemia   . Hypertension   . IBS (irritable bowel syndrome) 03/18/2013  . Incomplete right bundle branch block (RBBB)   . Internal hemorrhoids   . Kidney lesion 06/07/2015   Right midportion, 1.1x1.1 cm hyperechoic, noted on [place] ABD  . LAFB (left anterior fascicular block)   . Lipoma of axilla 09/2016  . Liver lesion, right lobe 06/07/2015   2.5x2.4x2.4 cm hypoechoic lesion posterior aspect, noted on [place] ABD  . Low back pain 03/26/2016  . LVH (left ventricular hypertrophy) 08/06/2017   Moderate, noted on ECHO  . Medicare annual wellness visit, subsequent 03/12/2014  . Muscle cramps   . Nocturia   . OA (osteoarthritis)    Back, Hands, Neck  . Obesity 11/25/2007   Qualifier: Diagnosis of  By: Lenna Gilford MD, Deborra Medina   . Other malaise and fatigue 03/13/2013  . Premature ventricular complex   . Preventative health care 03/07/2015  . Renal insufficiency   . Scoliosis    Upper thoracic and lumbar  . Vitamin D deficiency    resolved   Past Surgical History:  Procedure Laterality Date  .  CARDIOVERSION  07/31/2017  . CATARACT EXTRACTION, BILATERAL    . COLON SURGERY  2009   segmental sigmoid resection  . COLONOSCOPY    . CYSTOSCOPY/URETEROSCOPY/HOLMIUM LASER/STENT PLACEMENT Right 11/29/2017   Procedure: CYSTOSCOPY/RETROGRADE/URETEROSCOPY/HOLMIUM LASER/STENT PLACEMENT;  Surgeon: Kathie Rhodes, MD;  Location: [facility];  Service: Urology;  Laterality: Right;  . HOLMIUM LASER APPLICATION Right 0/34/7425   Procedure: HOLMIUM LASER APPLICATION;  Surgeon: Kathie Rhodes, MD;  Location: [facility];  Service: Urology;  Laterality: Right;  . NOSE SURGERY     Submucous resection age 2  . NUCLEAR STRESS TEST  06/03/2009  . SKIN BIOPSY    . URETEROSCOPY WITH HOLMIUM LASER  LITHOTRIPSY Right 09/24/2017   Procedure: CYSTOSCOPY, URETEROSCOPY WITH HOLMIUM LASER LITHOTRIPSY, STENT PLACEMENT;  Surgeon: Kathie Rhodes, MD;  Location: [facility];  Service: Urology;  Laterality: Right;   Social History   Social History Narrative   The patient is married for the second time, he has 3 sons.   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   2 alcoholic beverages most days.   1 caffeinated beverage daily   No drug use no current tobacco use he is a prior smoker   12/24/2016   family history includes Anxiety disorder in his paternal grandmother; Aortic aneurysm in his father; Arthritis in his brother; Atrial fibrillation in his brother, brother, brother, brother, and sister; Breast cancer in his mother; Cancer in his maternal grandfather and maternal grandmother; Colon cancer in his mother; Diabetes in his maternal grandmother; Heart attack in his brother, brother, brother, brother, maternal grandmother, and paternal grandmother; Heart disease in his father, mother, and sister; Hyperlipidemia in his brother; Hypertension in his brother, father, and mother; Obesity in his sister; Other in his brother and brother; Pneumonia in his paternal grandfather; Sleep apnea in his sister;  Stroke in his brother and mother.   Review of Systems As per HPI issues have been kidney stones with pain in the hematuria which resolved when he held his Xarelto  Objective:   Physical Exam @BP  124/62   Ht 6' (1.829 m)   Wt 232 lb 6.4 oz (105.4 kg)   BMI 31.52 kg/m @  General:  Well-developed, well-nourished and in no acute distress Eyes:  anicteric. Lungs: Clear to auscultation bilaterally. Heart:   S1S2, no rubs, murmurs, gallops. Abdomen:  soft, non-tender, no hepatosplenomegaly, hernia, or mass and BS+.  Rectal: deferred Lymph:  no cervical or supraclavicular adenopathy. Extremities:   no cyanosis or clubbing Psych:  appropriate mood and  Affect.   Data Reviewed: See HPI CT scan abdomen and pelvis with and without contrast in August right mid ureteral kidney stone, bilateral nephrolithiasis, no other lesions

## 2017-12-24 ENCOUNTER — Ambulatory Visit: Payer: Medicare Other | Admitting: Family Medicine

## 2017-12-24 ENCOUNTER — Encounter: Payer: Self-pay | Admitting: Family Medicine

## 2017-12-24 ENCOUNTER — Other Ambulatory Visit: Payer: Self-pay | Admitting: Family Medicine

## 2017-12-28 ENCOUNTER — Ambulatory Visit (INDEPENDENT_AMBULATORY_CARE_PROVIDER_SITE_OTHER): Payer: Medicare Other | Admitting: Family Medicine

## 2017-12-28 ENCOUNTER — Encounter: Payer: Self-pay | Admitting: Family Medicine

## 2017-12-28 VITALS — BP 122/62 | HR 58 | Temp 97.9°F | Resp 18 | Wt 232.4 lb

## 2017-12-28 DIAGNOSIS — M545 Low back pain, unspecified: Secondary | ICD-10-CM

## 2017-12-28 DIAGNOSIS — N200 Calculus of kidney: Secondary | ICD-10-CM

## 2017-12-28 DIAGNOSIS — I10 Essential (primary) hypertension: Secondary | ICD-10-CM

## 2017-12-28 DIAGNOSIS — Z79899 Other long term (current) drug therapy: Secondary | ICD-10-CM

## 2017-12-28 DIAGNOSIS — K760 Fatty (change of) liver, not elsewhere classified: Secondary | ICD-10-CM

## 2017-12-28 DIAGNOSIS — M79672 Pain in left foot: Secondary | ICD-10-CM

## 2017-12-28 DIAGNOSIS — E782 Mixed hyperlipidemia: Secondary | ICD-10-CM

## 2017-12-28 DIAGNOSIS — N289 Disorder of kidney and ureter, unspecified: Secondary | ICD-10-CM

## 2017-12-28 DIAGNOSIS — E559 Vitamin D deficiency, unspecified: Secondary | ICD-10-CM

## 2017-12-28 DIAGNOSIS — R739 Hyperglycemia, unspecified: Secondary | ICD-10-CM

## 2017-12-28 DIAGNOSIS — I4821 Permanent atrial fibrillation: Secondary | ICD-10-CM

## 2017-12-28 DIAGNOSIS — M79671 Pain in right foot: Secondary | ICD-10-CM

## 2017-12-28 LAB — URINALYSIS, ROUTINE W REFLEX MICROSCOPIC
Bilirubin Urine: NEGATIVE
KETONES UR: NEGATIVE
Specific Gravity, Urine: <=1.005 — AB (ref 1.000–1.030)
URINE GLUCOSE: NEGATIVE
Urobilinogen, UA: 1 (ref 0.0–1.0)
pH: 6.5 (ref 5.0–8.0)

## 2017-12-28 LAB — COMPREHENSIVE METABOLIC PANEL
ALBUMIN: 4.3 g/dL (ref 3.5–5.2)
ALT: 20 U/L (ref 0–53)
AST: 18 U/L (ref 0–37)
Alkaline Phosphatase: 70 U/L (ref 39–117)
BUN: 19 mg/dL (ref 6–23)
CO2: 29 meq/L (ref 19–32)
Calcium: 10.4 mg/dL (ref 8.4–10.5)
Chloride: 102 mEq/L (ref 96–112)
Creatinine, Ser: 1.47 mg/dL (ref 0.40–1.50)
GFR: 49.96 mL/min — ABNORMAL LOW (ref >60.00)
Glucose, Bld: 99 mg/dL (ref 70–99)
POTASSIUM: 4.4 meq/L (ref 3.5–5.1)
SODIUM: 138 meq/L (ref 135–145)
Total Bilirubin: 0.6 mg/dL (ref 0.2–1.2)
Total Protein: 6.7 g/dL (ref 6.0–8.3)

## 2017-12-28 LAB — CBC
HEMATOCRIT: 41 % (ref 39.0–52.0)
Hemoglobin: 13.6 g/dL (ref 13.0–17.0)
MCHC: 33.2 g/dL (ref 30.0–36.0)
MCV: 91.8 fl (ref 78.0–100.0)
Platelets: 214 10*3/uL (ref 150.0–400.0)
RBC: 4.46 Mil/uL (ref 4.22–5.81)
RDW: 14.4 % (ref 11.5–15.5)
WBC: 6.1 10*3/uL (ref 4.0–10.5)

## 2017-12-28 MED ORDER — TRAMADOL HCL 50 MG PO TABS: 50.0000 mg | ORAL_TABLET | Freq: Four times a day (QID) | ORAL | 0 refills | Status: DC | PRN

## 2017-12-28 MED ORDER — SERTRALINE HCL 50 MG PO TABS: 50.0000 mg | ORAL_TABLET | Freq: Every day | ORAL | 3 refills | Status: DC

## 2017-12-28 MED ORDER — LORAZEPAM 1 MG PO TABS: 0.5000 mg | ORAL_TABLET | Freq: Two times a day (BID) | ORAL | 1 refills | Status: DC | PRN

## 2017-12-28 NOTE — Assessment & Plan Note (Signed)
hgba1c acceptable, minimize simple carbs. Increase exercise as tolerated. Continue current meds. Consider orthopaedic consultation

## 2017-12-28 NOTE — Assessment & Plan Note (Signed)
Has been moving some heavy things and as a result his low back pain is flared. Lidocaine gel helps some.

## 2017-12-28 NOTE — Assessment & Plan Note (Signed)
Stent is to be removed on 01/10/18. He is doing well hydrate

## 2017-12-28 NOTE — Assessment & Plan Note (Signed)
Well controlled, no changes to meds. Encouraged heart healthy diet such as the DASH diet and exercise as tolerated.  °

## 2017-12-28 NOTE — Assessment & Plan Note (Addendum)
Hydrate and monitor, improving

## 2017-12-30 LAB — PAIN MGMT, PROFILE 8 W/CONF, U
6 Acetylmorphine: NEGATIVE ng/mL (ref <10)
ALCOHOL METABOLITES: NEGATIVE ng/mL (ref <500)
ALPHAHYDROXYTRIAZOLAM: NEGATIVE ng/mL (ref <50)
AMPHETAMINES: NEGATIVE ng/mL (ref <500)
Alphahydroxyalprazolam: NEGATIVE ng/mL (ref <25)
Alphahydroxymidazolam: NEGATIVE ng/mL (ref <50)
Aminoclonazepam: NEGATIVE ng/mL (ref <25)
BENZODIAZEPINES: POSITIVE ng/mL — AB (ref <100)
BUPRENORPHINE, URINE: NEGATIVE ng/mL (ref <5)
Cocaine Metabolite: NEGATIVE ng/mL (ref <150)
Creatinine: 60.5 mg/dL
HYDROXYETHYLFLURAZEPAM: NEGATIVE ng/mL (ref <50)
LORAZEPAM: 409 ng/mL — AB (ref <50)
MDMA: NEGATIVE ng/mL (ref <500)
Marijuana Metabolite: NEGATIVE ng/mL (ref <20)
NORDIAZEPAM: NEGATIVE ng/mL (ref <50)
OXIDANT: NEGATIVE ug/mL (ref <200)
Opiates: NEGATIVE ng/mL (ref <100)
Oxazepam: NEGATIVE ng/mL (ref <50)
Oxycodone: NEGATIVE ng/mL (ref <100)
Temazepam: NEGATIVE ng/mL (ref <50)
pH: 6.68 (ref 4.5–9.0)

## 2017-12-30 LAB — URINE CULTURE
MICRO NUMBER:: 91508725
Result:: NO GROWTH
SPECIMEN QUALITY:: ADEQUATE

## 2017-12-30 NOTE — Progress Notes (Signed)
Subjective:    Patient ID: David [name], male    DOB: 1945/12/09, 72 y.o.   MRN: [id number]  No chief complaint on file.   HPI Patient is in today for follow-up.  He is back pain is still present but improving.  He denies any hematuria or dysuria but does still have some urinary frequency that is improving.  He will has his stent in place but it will be removed soon.  No signs or symptoms of recent kidney stone passage.  Continues to struggle with anhedonia.  Denies any recent febrile illness or hospitalization. Denies CP/palp/SOB/HA/congestion/fevers/GI or GU c/o. Taking meds as prescribed  Past Medical History:  Diagnosis Date  . Abnormal liver function 03/18/2013  . Adenoma of right adrenal gland 07/11/2002   2.4cm , noted on CT ABD  . Anxiety   . Arthritis of both knees 03/26/2016  . Astigmatism   . Atrial fibrillation ([place])    x1 successful cardioversions  . Back pain   . BCC (basal cell carcinoma of skin)    under right eye and right ear  . Cataract 09/29/2016  . Depression   . Diverticulitis   . Diverticulosis   . Fatty liver   . GERD (gastroesophageal reflux disease)   . Gout   . Hearing loss of both ears 03/26/2016  . History of chronic prostatitis    started at age 27  . History of kidney stones   . Hyperglycemia 03/29/2016  . Hyperlipidemia   . Hypertension   . IBS (irritable bowel syndrome) 03/18/2013  . Incomplete right bundle branch block (RBBB)   . Internal hemorrhoids   . Kidney lesion 06/07/2015   Right midportion, 1.1x1.1 cm hyperechoic, noted on [place] ABD  . LAFB (left anterior fascicular block)   . Lipoma of axilla 09/2016  . Liver lesion, right lobe 06/07/2015   2.5x2.4x2.4 cm hypoechoic lesion posterior aspect, noted on [place] ABD  . Low back pain 03/26/2016  . LVH (left ventricular hypertrophy) 08/06/2017   Moderate, noted on ECHO  . Medicare annual wellness visit, subsequent 03/12/2014  . Muscle cramps   . Nocturia   . OA (osteoarthritis)    Back,  Hands, Neck  . Obesity 11/25/2007   Qualifier: Diagnosis of  By: Lenna Gilford MD, Deborra Medina   . Other malaise and fatigue 03/13/2013  . Premature ventricular complex   . Preventative health care 03/07/2015  . Renal insufficiency   . Scoliosis    Upper thoracic and lumbar  . Vitamin D deficiency    resolved    Past Surgical History:  Procedure Laterality Date  . CARDIOVERSION  07/31/2017  . CATARACT EXTRACTION, BILATERAL    . COLON SURGERY  2009   segmental sigmoid resection  . COLONOSCOPY    . CYSTOSCOPY/URETEROSCOPY/HOLMIUM LASER/STENT PLACEMENT Right 11/29/2017   Procedure: CYSTOSCOPY/RETROGRADE/URETEROSCOPY/HOLMIUM LASER/STENT PLACEMENT;  Surgeon: Kathie Rhodes, MD;  Location: [facility];  Service: Urology;  Laterality: Right;  . HOLMIUM LASER APPLICATION Right 03/27/4006   Procedure: HOLMIUM LASER APPLICATION;  Surgeon: Kathie Rhodes, MD;  Location: [facility];  Service: Urology;  Laterality: Right;  . NOSE SURGERY     Submucous resection age 26  . NUCLEAR STRESS TEST  06/03/2009  . SKIN BIOPSY    . URETEROSCOPY WITH HOLMIUM LASER LITHOTRIPSY Right 09/24/2017   Procedure: CYSTOSCOPY, URETEROSCOPY WITH HOLMIUM LASER LITHOTRIPSY, STENT PLACEMENT;  Surgeon: Kathie Rhodes, MD;  Location: [facility];  Service: Urology;  Laterality: Right;    Family History  Problem Relation Age of Onset  . Heart disease Mother   . Hypertension Mother   . Stroke Mother   . Colon cancer Mother   . Breast cancer Mother   . Heart disease Father        pacemaker  . Aortic aneurysm Father   . Hypertension Father   . Heart disease Sister   . Atrial fibrillation Sister   . Obesity Sister   . Sleep apnea Sister   . Heart attack Brother   . Other Brother        muscle disease  . Arthritis Brother   . Stroke Brother   . Atrial fibrillation Brother   . Cancer Maternal Grandmother        ?  Marland Kitchen Heart attack Maternal Grandmother   . Diabetes Maternal Grandmother   . Cancer  Maternal Grandfather        hodgin's lymphoma  . Heart attack Paternal Grandmother   . Anxiety disorder Paternal Grandmother   . Pneumonia Paternal Grandfather   . Heart attack Brother   . Atrial fibrillation Brother   . Atrial fibrillation Brother   . Heart attack Brother   . Hypertension Brother   . Hyperlipidemia Brother   . Heart attack Brother   . Other Brother        heart valve operation  . Atrial fibrillation Brother     Social History   Socioeconomic History  . Marital status: Married    Spouse name: Not on file  . Number of children: 3  . Years of education: Not on file  . Highest education level: Not on file  Occupational History  . Occupation: Nurse, adult: [employer]  . Financial resource strain: Not on file  . Food insecurity:    Worry: Not on file    Inability: Not on file  . Transportation needs:    Medical: Not on file    Non-medical: Not on file  Tobacco Use  . Smoking status: Former Smoker    Packs/day: 1.00    Years: 6.00    Pack years: 6.00    Types: Cigarettes    Start date: 01/12/1969  . Smokeless tobacco: Never Used  . Tobacco comment: [id number]  Substance and Sexual Activity  . Alcohol use: Not Currently    Comment: 2 drinks 5 nights a week   . Drug use: No  . Sexual activity: Yes  Lifestyle  . Physical activity:    Days per week: Not on file    Minutes per session: Not on file  . Stress: Not on file  Relationships  . Social connections:    Talks on phone: Not on file    Gets together: Not on file    Attends religious service: Not on file    Active member of club or organization: Not on file    Attends meetings of clubs or organizations: Not on file    Relationship status: Not on file  . Intimate partner violence:    Fear of current or ex partner: Not on file    Emotionally abused: Not on file    Physically abused: Not on file    Forced sexual activity: Not on file  Other Topics Concern  . Not on file  Social  History Narrative   The patient is married for the second time, he has 3 sons.   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   2 alcoholic beverages most days.  1 caffeinated beverage daily   No drug use no current tobacco use he is a prior smoker   12/24/2016    Outpatient Medications Prior to Visit  Medication Sig Dispense Refill  . acetaminophen (TYLENOL) 325 MG tablet Take 650 mg by mouth every 6 (six) hours as needed for moderate pain.     Marland Kitchen alum hydroxide-mag trisilicate (GAVISCON) 28-78 MG CHEW Chew 1 tablet by mouth daily as needed for indigestion or heartburn.     Marland Kitchen atenolol (TENORMIN) 25 MG tablet TAKE ONE TABLET BY MOUTH TWICE A DAY (Patient taking differently: Take 25 mg by mouth 2 (two) times daily. ) 180 tablet 2  . carboxymethylcellulose (REFRESH PLUS) 0.5 % SOLN Place 1 drop into both eyes daily.    . Colchicine 0.6 MG CAPS 1 tab po bid (Patient taking differently: Take 0.6 mg by mouth daily as needed (gout). 1 tab po bid) 60 capsule 0  . cyclobenzaprine (FLEXERIL) 5 MG tablet Take 1 tablet (5 mg total) by mouth 3 (three) times daily as needed for muscle spasms. 30 tablet 1  . fluticasone (FLONASE) 50 MCG/ACT nasal spray Place 2 sprays into the nose at bedtime. For congestion    . HYDROcodone-acetaminophen (NORCO) 10-325 MG tablet Take 1-2 tablets by mouth every 4 (four) hours as needed for moderate pain. Maximum dose per 24 hours - 8 pills 18 tablet 0  . Menthol, Topical Analgesic, (ICY HOT BACK EX) Apply 1 application topically daily as needed (back pain).    Marland Kitchen omeprazole (PRILOSEC) 20 MG capsule Take 20 mg by mouth as needed.    . phenazopyridine (PYRIDIUM) 200 MG tablet Take 1 tablet (200 mg total) by mouth 3 (three) times daily as needed for pain. 20 tablet 0  . Probiotic Product (PROBIOTIC DAILY PO) Take 1 capsule by mouth daily.     . tamsulosin (FLOMAX) 0.4 MG CAPS capsule Take 0.4 mg by mouth daily after supper.    Marland Kitchen LORazepam (ATIVAN) 1 MG tablet Take 0.5 tablets (0.5  mg total) by mouth 2 (two) times daily as needed. 90 tablet 1  . traMADol (ULTRAM) 50 MG tablet Take 1 tablet (50 mg total) by mouth every 6 (six) hours as needed. 20 tablet 0   No facility-administered medications prior to visit.     Allergies  Allergen Reactions  . Cefaclor Hives  . Cephalosporins Hives  . Penicillins Rash    Mild maculopapular rash Has patient had a PCN reaction causing immediate rash, facial/tongue/throat swelling, SOB or lightheadedness with hypotension: No Has patient had a PCN reaction causing severe rash involving mucus membranes or skin necrosis: No Has patient had a PCN reaction that required hospitalization: No Has patient had a PCN reaction occurring within the last 10 years: No If all of the above answers are "NO", then may proceed with Cephalosporin use.     Review of Systems  Constitutional: Negative for fever and malaise/fatigue.  HENT: Negative for congestion.   Eyes: Negative for blurred vision.  Respiratory: Negative for shortness of breath.   Cardiovascular: Negative for chest pain, palpitations and leg swelling.  Gastrointestinal: Negative for abdominal pain, blood in stool and nausea.  Genitourinary: Negative for dysuria and frequency.  Musculoskeletal: Positive for back pain. Negative for falls.  Skin: Negative for rash.  Neurological: Negative for dizziness, loss of consciousness and headaches.  Endo/Heme/Allergies: Negative for environmental allergies.  Psychiatric/Behavioral: Positive for depression. The patient is not nervous/anxious.        Objective:  Physical Exam Vitals signs and nursing note reviewed.  Constitutional:      General: He is not in acute distress.    Appearance: He is well-developed.  HENT:     Head: Normocephalic and atraumatic.     Nose: Nose normal.  Eyes:     General:        Right eye: No discharge.        Left eye: No discharge.  Neck:     Musculoskeletal: Normal range of motion and neck supple.    Cardiovascular:     Rate and Rhythm: Normal rate and regular rhythm.     Heart sounds: No murmur.  Pulmonary:     Effort: Pulmonary effort is normal.     Breath sounds: Normal breath sounds.  Abdominal:     General: Bowel sounds are normal.     Palpations: Abdomen is soft.     Tenderness: There is no abdominal tenderness.  Skin:    General: Skin is warm and dry.  Neurological:     Mental Status: He is alert and oriented to person, place, and time.     BP 122/62 (BP Location: Left Arm, Patient Position: Sitting, Cuff Size: Large)   Pulse (!) 58   Temp 97.9 F (36.6 C) (Oral)   Resp 18   Wt 232 lb 6.4 oz (105.4 kg)   SpO2 96%   BMI 31.52 kg/m  Wt Readings from Last 3 Encounters:  12/28/17 232 lb 6.4 oz (105.4 kg)  12/17/17 232 lb 6.4 oz (105.4 kg)  12/03/17 235 lb 6 oz (106.8 kg)     Lab Results  Component Value Date   WBC 6.1 12/28/2017   HGB 13.6 12/28/2017   HCT 41.0 12/28/2017   PLT 214.0 12/28/2017   GLUCOSE 99 12/28/2017   CHOL 202 (H) 10/08/2017   TRIG 94.0 10/08/2017   HDL 33.00 (L) 10/08/2017   LDLDIRECT 168.5 03/29/2006   LDLCALC 151 (H) 10/08/2017   ALT 20 12/28/2017   AST 18 12/28/2017   NA 138 12/28/2017   K 4.4 12/28/2017   CL 102 12/28/2017   CREATININE 1.47 12/28/2017   BUN 19 12/28/2017   CO2 29 12/28/2017   TSH 3.05 10/08/2017   PSA 3.36 11/15/2017   INR 1.39 11/14/2010   HGBA1C 5.7 10/08/2017    Lab Results  Component Value Date   TSH 3.05 10/08/2017   Lab Results  Component Value Date   WBC 6.1 12/28/2017   HGB 13.6 12/28/2017   HCT 41.0 12/28/2017   MCV 91.8 12/28/2017   PLT 214.0 12/28/2017   Lab Results  Component Value Date   NA 138 12/28/2017   K 4.4 12/28/2017   CO2 29 12/28/2017   GLUCOSE 99 12/28/2017   BUN 19 12/28/2017   CREATININE 1.47 12/28/2017   BILITOT 0.6 12/28/2017   ALKPHOS 70 12/28/2017   AST 18 12/28/2017   ALT 20 12/28/2017   PROT 6.7 12/28/2017   ALBUMIN 4.3 12/28/2017   CALCIUM 10.4  12/28/2017   ANIONGAP 8 10/03/2017   GFR 49.96 (L) 12/28/2017   Lab Results  Component Value Date   CHOL 202 (H) 10/08/2017   Lab Results  Component Value Date   HDL 33.00 (L) 10/08/2017   Lab Results  Component Value Date   LDLCALC 151 (H) 10/08/2017   Lab Results  Component Value Date   TRIG 94.0 10/08/2017   Lab Results  Component Value Date   CHOLHDL 6 10/08/2017   Lab Results  Component Value Date   HGBA1C 5.7 10/08/2017       Assessment & Plan:   Problem List Items Addressed This Visit    Fatty liver disease, nonalcoholic   Hyperlipidemia, mixed   Essential hypertension, benign    Well controlled, no changes to meds. Encouraged heart healthy diet such as the DASH diet and exercise as tolerated.       Atrial fibrillation (HCC)   Renal insufficiency    Hydrate and monitor, improving      Relevant Orders   Comprehensive metabolic panel (Completed)   Low back pain    Has been moving some heavy things and as a result his low back pain is flared. Lidocaine gel helps some.       Relevant Medications   traMADol (ULTRAM) 50 MG tablet   Hyperglycemia    hgba1c acceptable, minimize simple carbs. Increase exercise as tolerated. Continue current meds. Consider orthopaedic consultation      Kidney stone    Stent is to be removed on 01/10/18. He is doing well hydrate      Relevant Medications   traMADol (ULTRAM) 50 MG tablet   Other Relevant Orders   CBC (Completed)   Urine Culture (Completed)   Urinalysis   Foot pain, bilateral    Other Visit Diagnoses    High risk medication use    -  Primary   Relevant Orders   Pain Mgmt, Profile 8 w/Conf, U   Vitamin D deficiency       Relevant Orders   Comprehensive metabolic panel (Completed)      I am having David [name] "Fred" maintain his fluticasone, alum hydroxide-mag trisilicate, cyclobenzaprine, atenolol, Colchicine, acetaminophen, tamsulosin, carboxymethylcellulose, (Menthol, Topical Analgesic,  (ICY HOT BACK EX)), Probiotic Product (PROBIOTIC DAILY PO), HYDROcodone-acetaminophen, phenazopyridine, omeprazole, traMADol, sertraline, and LORazepam.  Meds ordered this encounter  Medications  . traMADol (ULTRAM) 50 MG tablet    Sig: Take 1 tablet (50 mg total) by mouth every 6 (six) hours as needed.    Dispense:  20 tablet    Refill:  0  . sertraline (ZOLOFT) 50 MG tablet    Sig: Take 1 tablet (50 mg total) by mouth daily.    Dispense:  30 tablet    Refill:  3  . LORazepam (ATIVAN) 1 MG tablet    Sig: Take 0.5 tablets (0.5 mg total) by mouth 2 (two) times daily as needed.    Dispense:  90 tablet    Refill:  1     Penni Homans, MD

## 2018-01-11 ENCOUNTER — Encounter: Payer: Self-pay | Admitting: Internal Medicine

## 2018-01-24 DIAGNOSIS — H0259 Other disorders affecting eyelid function: Secondary | ICD-10-CM | POA: Insufficient documentation

## 2018-01-25 ENCOUNTER — Encounter: Payer: Medicare Other | Admitting: Internal Medicine

## 2018-02-02 NOTE — Progress Notes (Addendum)
Subjective:   David [name] is a 73 y.o. male who presents for Medicare Annual/Subsequent preventive examination.  Retired Optometrist.  Review of Systems: No ROS.  Medicare Wellness Visit. Additional risk factors are reflected in the social history. Cardiac Risk Factors include: advanced age (>14men, >7 women);dyslipidemia;hypertension;male gender   Sleep patterns: wakes a couple times to urinate. Sleeps 9-10 hrs. Naps as needed. Home Safety/Smoke Alarms: Feels safe in home. Smoke alarms in place.  Lives in 3 story home with wife. No issue with stairs. Step over tub with grab rails.  Male:   [facility]- appt with Dr. Carlean Purl in 3 weeks per pt    PSA-  Lab Results  Component Value Date   PSA 3.36 11/15/2017   PSA 3.16 03/30/2017   PSA 1.47 02/23/2014       Objective:    Vitals: BP 140/82 (BP Location: Left Arm, Patient Position: Sitting, Cuff Size: Normal)   Pulse 60   Ht 6' (1.829 m)   Wt 234 lb (106.1 kg)   SpO2 97%   BMI 31.74 kg/m   Body mass index is 31.74 kg/m.  Advanced Directives 02/03/2018 11/25/2017 10/08/2017 10/03/2017 09/24/2017 07/31/2017 03/31/2017  Does Patient Have a Medical Advance Directive? No No No No No No No  Would patient like information on creating a medical advance directive? No - Patient declined - No - Patient declined - Yes (MAU/Ambulatory/Procedural Areas - Information given) - -    Tobacco Social History   Tobacco Use  Smoking Status Former Smoker  . Packs/day: 1.00  . Years: 6.00  . Pack years: 6.00  . Types: Cigarettes  . Start date: 01/12/1969  Smokeless Tobacco Never Used  Tobacco Comment   1965-1971     Counseling given: Not Answered Comment: 1965-1971   Clinical Intake:     Pain : No/denies pain                 Past Medical History:  Diagnosis Date  . Abnormal liver function 03/18/2013  . Adenoma of right adrenal gland 07/11/2002   2.4cm , noted on CT ABD  . Anxiety   . Arthritis of both knees 03/26/2016  .  Astigmatism   . Atrial fibrillation ([place])    x1 successful cardioversions  . Back pain   . BCC (basal cell carcinoma of skin)    under right eye and right ear  . Cataract 09/29/2016  . Depression   . Diverticulitis   . Diverticulosis   . Fatty liver   . GERD (gastroesophageal reflux disease)   . Gout   . Hearing loss of both ears 03/26/2016  . History of chronic prostatitis    started at age 30  . History of kidney stones   . Hyperglycemia 03/29/2016  . Hyperlipidemia   . Hypertension   . IBS (irritable bowel syndrome) 03/18/2013  . Incomplete right bundle branch block (RBBB)   . Internal hemorrhoids   . Kidney lesion 06/07/2015   Right midportion, 1.1x1.1 cm hyperechoic, noted on [place] ABD  . LAFB (left anterior fascicular block)   . Lipoma of axilla 09/2016  . Liver lesion, right lobe 06/07/2015   2.5x2.4x2.4 cm hypoechoic lesion posterior aspect, noted on [place] ABD  . Low back pain 03/26/2016  . LVH (left ventricular hypertrophy) 08/06/2017   Moderate, noted on ECHO  . Medicare annual wellness visit, subsequent 03/12/2014  . Muscle cramps   . Nocturia   . OA (osteoarthritis)    Back, Hands, Neck  .  Obesity 11/25/2007   Qualifier: Diagnosis of  By: Lenna Gilford MD, Deborra Medina   . Other malaise and fatigue 03/13/2013  . Premature ventricular complex   . Preventative health care 03/07/2015  . Renal insufficiency   . Scoliosis    Upper thoracic and lumbar  . Vitamin D deficiency    resolved   Past Surgical History:  Procedure Laterality Date  . CARDIOVERSION  07/31/2017  . CATARACT EXTRACTION, BILATERAL    . COLON SURGERY  2009   segmental sigmoid resection  . COLONOSCOPY    . CYSTOSCOPY/URETEROSCOPY/HOLMIUM LASER/STENT PLACEMENT Right 11/29/2017   Procedure: CYSTOSCOPY/RETROGRADE/URETEROSCOPY/HOLMIUM LASER/STENT PLACEMENT;  Surgeon: Kathie Rhodes, MD;  Location: [facility];  Service: Urology;  Laterality: Right;  . EYE SURGERY Bilateral 01/12/2017   cataract removal  . HOLMIUM LASER  APPLICATION Right 7/85/8850   Procedure: HOLMIUM LASER APPLICATION;  Surgeon: Kathie Rhodes, MD;  Location: [facility];  Service: Urology;  Laterality: Right;  . NOSE SURGERY     Submucous resection age 81  . NUCLEAR STRESS TEST  06/03/2009  . SKIN BIOPSY    . URETEROSCOPY WITH HOLMIUM LASER LITHOTRIPSY Right 09/24/2017   Procedure: CYSTOSCOPY, URETEROSCOPY WITH HOLMIUM LASER LITHOTRIPSY, STENT PLACEMENT;  Surgeon: Kathie Rhodes, MD;  Location: [facility];  Service: Urology;  Laterality: Right;   Family History  Problem Relation Age of Onset  . Heart disease Mother   . Hypertension Mother   . Stroke Mother   . Colon cancer Mother   . Breast cancer Mother   . Heart disease Father        pacemaker  . Aortic aneurysm Father   . Hypertension Father   . Heart disease Sister   . Atrial fibrillation Sister   . Obesity Sister   . Sleep apnea Sister   . Heart attack Brother   . Other Brother        muscle disease  . Arthritis Brother   . Stroke Brother   . Atrial fibrillation Brother   . Cancer Maternal Grandmother        ?  Marland Kitchen Heart attack Maternal Grandmother   . Diabetes Maternal Grandmother   . Cancer Maternal Grandfather        hodgin's lymphoma  . Heart attack Paternal Grandmother   . Anxiety disorder Paternal Grandmother   . Pneumonia Paternal Grandfather   . Heart attack Brother   . Atrial fibrillation Brother   . Atrial fibrillation Brother   . Heart attack Brother   . Hypertension Brother   . Hyperlipidemia Brother   . Heart attack Brother   . Other Brother        heart valve operation  . Atrial fibrillation Brother    Social History   Socioeconomic History  . Marital status: Married    Spouse name: Not on file  . Number of children: 3  . Years of education: Not on file  . Highest education level: Not on file  Occupational History  . Occupation: Nurse, adult: [employer]  . Financial resource strain: Not on file    . Food insecurity:    Worry: Not on file    Inability: Not on file  . Transportation needs:    Medical: Not on file    Non-medical: Not on file  Tobacco Use  . Smoking status: Former Smoker    Packs/day: 1.00    Years: 6.00    Pack years: 6.00    Types: Cigarettes  Start date: 01/12/1969  . Smokeless tobacco: Never Used  . Tobacco comment: [id number]  Substance and Sexual Activity  . Alcohol use: Not Currently    Comment: 2 drinks 5 nights a week   . Drug use: No  . Sexual activity: Yes  Lifestyle  . Physical activity:    Days per week: Not on file    Minutes per session: Not on file  . Stress: Not on file  Relationships  . Social connections:    Talks on phone: Not on file    Gets together: Not on file    Attends religious service: Not on file    Active member of club or organization: Not on file    Attends meetings of clubs or organizations: Not on file    Relationship status: Not on file  Other Topics Concern  . Not on file  Social History Narrative   The patient is married for the second time, he has 3 sons.   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   2 alcoholic beverages most days.   1 caffeinated beverage daily   No drug use no current tobacco use he is a prior smoker   12/24/2016    Outpatient Encounter Medications as of 02/03/2018  Medication Sig  . acetaminophen (TYLENOL) 325 MG tablet Take 650 mg by mouth every 6 (six) hours as needed for moderate pain.   Marland Kitchen alum hydroxide-mag trisilicate (GAVISCON) 82-42 MG CHEW Chew 1 tablet by mouth daily as needed for indigestion or heartburn.   Marland Kitchen atenolol (TENORMIN) 25 MG tablet TAKE ONE TABLET BY MOUTH TWICE A DAY (Patient taking differently: Take 25 mg by mouth 2 (two) times daily. )  . Colchicine 0.6 MG CAPS 1 tab po bid (Patient taking differently: Take 0.6 mg by mouth daily as needed (gout). 1 tab po bid)  . cyclobenzaprine (FLEXERIL) 5 MG tablet Take 1 tablet (5 mg total) by mouth 3 (three) times daily as  needed for muscle spasms.  . fluticasone (FLONASE) 50 MCG/ACT nasal spray Place 2 sprays into the nose at bedtime. For congestion  . LORazepam (ATIVAN) 1 MG tablet Take 0.5 tablets (0.5 mg total) by mouth 2 (two) times daily as needed.  . Menthol, Topical Analgesic, (ICY HOT BACK EX) Apply 1 application topically daily as needed (back pain).  Marland Kitchen omeprazole (PRILOSEC) 20 MG capsule Take 20 mg by mouth as needed.  . Probiotic Product (PROBIOTIC DAILY PO) Take 1 capsule by mouth daily.   . carboxymethylcellulose (REFRESH PLUS) 0.5 % SOLN Place 1 drop into both eyes daily.  Marland Kitchen HYDROcodone-acetaminophen (NORCO) 10-325 MG tablet Take 1-2 tablets by mouth every 4 (four) hours as needed for moderate pain. Maximum dose per 24 hours - 8 pills (Patient not taking: Reported on 02/03/2018)  . phenazopyridine (PYRIDIUM) 200 MG tablet Take 1 tablet (200 mg total) by mouth 3 (three) times daily as needed for pain. (Patient not taking: Reported on 02/03/2018)  . sertraline (ZOLOFT) 50 MG tablet Take 1 tablet (50 mg total) by mouth daily. (Patient not taking: Reported on 02/03/2018)  . tamsulosin (FLOMAX) 0.4 MG CAPS capsule Take 0.4 mg by mouth daily after supper.  . traMADol (ULTRAM) 50 MG tablet Take 1 tablet (50 mg total) by mouth every 6 (six) hours as needed. (Patient not taking: Reported on 02/03/2018)   No facility-administered encounter medications on file as of 02/03/2018.     Activities of Daily Living In your present state of health, do you have any difficulty  performing the following activities: 02/03/2018 09/24/2017  Hearing? Y Y  Comment pt states he has tried several hearing aids. not wearing any -  Vision? N N  Comment hx cataract sx. wears reading glasses.  -  Difficulty concentrating or making decisions? N N  Comment - -  Walking or climbing stairs? N N  Comment - -  Dressing or bathing? N N  Doing errands, shopping? N -  Preparing Food and eating ? N -  Using the Toilet? N -  In the past six  months, have you accidently leaked urine? N -  Do you have problems with loss of bowel control? N -  Managing your Medications? N -  Managing your Finances? N -  Housekeeping or managing your Housekeeping? N -  Some recent data might be hidden    Patient Care Team: Mosie Lukes, MD as PCP - General (Family Medicine) Minus Breeding, MD as PCP - Cardiology (Cardiology) Linward Natal, MD as Consulting Physician (Ophthalmology) Danella Sensing, MD as Consulting Physician (Dermatology)   Assessment:   This is a routine wellness examination for [place]. Physical assessment deferred to PCP.  Exercise Activities and Dietary recommendations Current Exercise Habits: Home exercise routine, Type of exercise: treadmill, Time (Minutes): 25, Frequency (Times/Week): 3, Weekly Exercise (Minutes/Week): 75, Exercise limited by: None identified Diet (meal preparation, eat out, water intake, caffeinated beverages, dairy products, fruits and vegetables): well balanced, on average, 2 meals per day  Pt reports he needs to drink more water but doesn't like it.      Goals    . DIET - INCREASE WATER INTAKE    . Increase physical activity       Fall Risk Fall Risk  02/03/2018 10/08/2017 09/29/2016 07/15/2015 05/09/2015  Falls in the past year? 0 No No No Yes  Number falls in past yr: - - - - 1  Injury with Fall? - - - - Yes  Comment - - - - Skinned Knee  Risk for fall due to : - - - - Impaired balance/gait  Follow up - - - - Falls prevention discussed     Depression Screen PHQ 2/9 Scores 02/03/2018 10/08/2017 03/30/2017 09/29/2016  PHQ - 2 Score 0 0 0 0  Exception Documentation - - - -    Cognitive Function Ad8 score reviewed for issues:  Issues making decisions:no  Less interest in hobbies / activities:no  Repeats questions, stories (family complaining):no  Trouble using ordinary gadgets (microwave, computer, phone):no  Forgets the month or year: no  Mismanaging finances:  no  Remembering appts:no  Daily problems with thinking and/or memory:no Ad8 score is=0        Immunization History  Administered Date(s) Administered  . Influenza Split 10/02/2015  . Influenza, High Dose Seasonal PF 09/29/2016, 11/15/2017  . Influenza,inj,Quad PF,6+ Mos 09/14/2012, 09/25/2013, 03/07/2015  . Pneumococcal Conjugate-13 09/05/2012  . Pneumococcal Polysaccharide-23 08/30/2014  . Tdap 09/05/2012    Screening Tests Health Maintenance  Topic Date Due  . COLONOSCOPY  09/08/2017  . TETANUS/TDAP  09/06/2022  . INFLUENZA VACCINE  Completed  . Hepatitis C Screening  Completed  . PNA vac Low Risk Adult  Completed        Plan:    Please schedule your next medicare wellness visit with me in 1 yr.  Eat heart healthy diet (full of fruits, vegetables, whole grains, lean protein, water--limit salt, fat, and sugar intake) and increase physical activity as tolerated.  Bring a copy of your living will  and/or healthcare power of attorney to your next office visit.    I have personally reviewed and noted the following in the patient's chart:   . Medical and social history . Use of alcohol, tobacco or illicit drugs  . Current medications and supplements . Functional ability and status . Nutritional status . Physical activity . Advanced directives . List of other physicians . Hospitalizations, surgeries, and ER visits in previous 12 months . Vitals . Screenings to include cognitive, depression, and falls . Referrals and appointments  In addition, I have reviewed and discussed with patient certain preventive protocols, quality metrics, and best practice recommendations. A written personalized care plan for preventive services as well as general preventive health recommendations were provided to patient.     Shela Nevin, [place]  02/03/2018  Medical screening examination/treatment was performed by qualified clinical staff member and as supervising physician I was  immediately available for consultation/collaboration. I have reviewed documentation and agree with assessment and plan.  Penni Homans, MD

## 2018-02-03 ENCOUNTER — Ambulatory Visit (INDEPENDENT_AMBULATORY_CARE_PROVIDER_SITE_OTHER): Payer: Medicare Other | Admitting: *Deleted

## 2018-02-03 ENCOUNTER — Encounter: Payer: Self-pay | Admitting: *Deleted

## 2018-02-03 VITALS — BP 140/82 | HR 60 | Ht 72.0 in | Wt 234.0 lb

## 2018-02-03 DIAGNOSIS — Z0000 Encounter for general adult medical examination without abnormal findings: Secondary | ICD-10-CM

## 2018-02-03 NOTE — Patient Instructions (Signed)
Please schedule your next medicare wellness visit with me in 1 yr.  Eat heart healthy diet (full of fruits, vegetables, whole grains, lean protein, water--limit salt, fat, and sugar intake) and increase physical activity as tolerated.  Bring a copy of your living will and/or healthcare power of attorney to your next office visit.   [name] , Thank you for taking time to come for your Medicare Wellness Visit. I appreciate your ongoing commitment to your health goals. Please review the following plan we discussed and let me know if I can assist you in the future.   These are the goals we discussed: Goals    . DIET - INCREASE WATER INTAKE    . Increase physical activity       This is a list of the screening recommended for you and due dates:  Health Maintenance  Topic Date Due  . Colon Cancer Screening  09/08/2017  . Tetanus Vaccine  09/06/2022  . Flu Shot  Completed  .  Hepatitis C: One time screening is recommended by Center for Disease Control  (CDC) for  adults born from [birth year] through 1965.   Completed  . Pneumonia vaccines  Completed    Health Maintenance After Age 31 After age 37, you are at a higher risk for certain long-term diseases and infections as well as injuries from falls. Falls are a major cause of broken bones and head injuries in people who are older than age 54. Getting regular preventive care can help to keep you healthy and well. Preventive care includes getting regular testing and making lifestyle changes as recommended by your health care provider. Talk with your health care provider about:  Which screenings and tests you should have. A screening is a test that checks for a disease when you have no symptoms.  A diet and exercise plan that is right for you. What should I know about screenings and tests to prevent falls? Screening and testing are the best ways to find a health problem early. Early diagnosis and treatment give you the best chance of managing  medical conditions that are common after age 14. Certain conditions and lifestyle choices may make you more likely to have a fall. Your health care provider may recommend:  Regular vision checks. Poor vision and conditions such as cataracts can make you more likely to have a fall. If you wear glasses, make sure to get your prescription updated if your vision changes.  Medicine review. Work with your health care provider to regularly review all of the medicines you are taking, including over-the-counter medicines. Ask your health care provider about any side effects that may make you more likely to have a fall. Tell your health care provider if any medicines that you take make you feel dizzy or sleepy.  Osteoporosis screening. Osteoporosis is a condition that causes the bones to get weaker. This can make the bones weak and cause them to break more easily.  Blood pressure screening. Blood pressure changes and medicines to control blood pressure can make you feel dizzy.  Strength and balance checks. Your health care provider may recommend certain tests to check your strength and balance while standing, walking, or changing positions.  Foot health exam. Foot pain and numbness, as well as not wearing proper footwear, can make you more likely to have a fall.  Depression screening. You may be more likely to have a fall if you have a fear of falling, feel emotionally low, or feel unable to do  activities that you used to do.  Alcohol use screening. Using too much alcohol can affect your balance and may make you more likely to have a fall. What actions can I take to lower my risk of falls? General instructions  Talk with your health care provider about your risks for falling. Tell your health care provider if: ? You fall. Be sure to tell your health care provider about all falls, even ones that seem minor. ? You feel dizzy, sleepy, or off-balance.  Take over-the-counter and prescription medicines only  as told by your health care provider. These include any supplements.  Eat a healthy diet and maintain a healthy weight. A healthy diet includes low-fat dairy products, low-fat (lean) meats, and fiber from whole grains, beans, and lots of fruits and vegetables. Home safety  Remove any tripping hazards, such as rugs, cords, and clutter.  Install safety equipment such as grab bars in bathrooms and safety rails on stairs.  Keep rooms and walkways well-lit. Activity   Follow a regular exercise program to stay fit. This will help you maintain your balance. Ask your health care provider what types of exercise are appropriate for you.  If you need a cane or walker, use it as recommended by your health care provider.  Wear supportive shoes that have nonskid soles. Lifestyle  Do not drink alcohol if your health care provider tells you not to drink.  If you drink alcohol, limit how much you have: ? 0-1 drink a day for women. ? 0-2 drinks a day for men.  Be aware of how much alcohol is in your drink. In the [place] one drink equals one typical bottle of beer (12 oz), one-half glass of wine (5 oz), or one shot of hard liquor (1 oz).  Do not use any products that contain nicotine or tobacco, such as cigarettes and e-cigarettes. If you need help quitting, ask your health care provider. Summary  Having a healthy lifestyle and getting preventive care can help to protect your health and wellness after age 66.  Screening and testing are the best way to find a health problem early and help you avoid having a fall. Early diagnosis and treatment give you the best chance for managing medical conditions that are more common for people who are older than age 75.  Falls are a major cause of broken bones and head injuries in people who are older than age 55. Take precautions to prevent a fall at home.  Work with your health care provider to learn what changes you can make to improve your health and wellness  and to prevent falls. This information is not intended to replace advice given to you by your health care provider. Make sure you discuss any questions you have with your health care provider. Document Released: 11/11/2016 Document Revised: 11/11/2016 Document Reviewed: 11/11/2016 Elsevier Interactive Patient Education  2019 [employer].

## 2018-02-15 ENCOUNTER — Other Ambulatory Visit: Payer: Self-pay | Admitting: Urology

## 2018-02-18 ENCOUNTER — Encounter: Payer: Self-pay | Admitting: Family Medicine

## 2018-02-18 ENCOUNTER — Ambulatory Visit (INDEPENDENT_AMBULATORY_CARE_PROVIDER_SITE_OTHER): Payer: Medicare Other | Admitting: Family Medicine

## 2018-02-18 VITALS — BP 134/77 | HR 57 | Temp 97.7°F | Resp 16 | Ht 72.0 in | Wt 235.4 lb

## 2018-02-18 DIAGNOSIS — R739 Hyperglycemia, unspecified: Secondary | ICD-10-CM | POA: Diagnosis not present

## 2018-02-18 DIAGNOSIS — I10 Essential (primary) hypertension: Secondary | ICD-10-CM

## 2018-02-18 DIAGNOSIS — E782 Mixed hyperlipidemia: Secondary | ICD-10-CM | POA: Diagnosis not present

## 2018-02-18 DIAGNOSIS — N200 Calculus of kidney: Secondary | ICD-10-CM | POA: Diagnosis not present

## 2018-02-18 DIAGNOSIS — N289 Disorder of kidney and ureter, unspecified: Secondary | ICD-10-CM

## 2018-02-18 LAB — CBC WITH DIFFERENTIAL/PLATELET
BASOS ABS: 0.1 10*3/uL (ref 0.0–0.1)
Basophils Relative: 0.9 % (ref 0.0–3.0)
EOS ABS: 0.3 10*3/uL (ref 0.0–0.7)
Eosinophils Relative: 4.9 % (ref 0.0–5.0)
HCT: 41 % (ref 39.0–52.0)
Hemoglobin: 13.6 g/dL (ref 13.0–17.0)
LYMPHS ABS: 1.7 10*3/uL (ref 0.7–4.0)
LYMPHS PCT: 27.2 % (ref 12.0–46.0)
MCHC: 33.3 g/dL (ref 30.0–36.0)
MCV: 91.4 fl (ref 78.0–100.0)
MONO ABS: 0.6 10*3/uL (ref 0.1–1.0)
Monocytes Relative: 8.9 % (ref 3.0–12.0)
NEUTROS ABS: 3.7 10*3/uL (ref 1.4–7.7)
NEUTROS PCT: 58.1 % (ref 43.0–77.0)
PLATELETS: 190 10*3/uL (ref 150.0–400.0)
RBC: 4.48 Mil/uL (ref 4.22–5.81)
RDW: 14.3 % (ref 11.5–15.5)
WBC: 6.4 10*3/uL (ref 4.0–10.5)

## 2018-02-18 LAB — COMPREHENSIVE METABOLIC PANEL
ALT: 15 U/L (ref 0–53)
AST: 15 U/L (ref 0–37)
Albumin: 4.2 g/dL (ref 3.5–5.2)
Alkaline Phosphatase: 76 U/L (ref 39–117)
BILIRUBIN TOTAL: 0.5 mg/dL (ref 0.2–1.2)
BUN: 31 mg/dL — ABNORMAL HIGH (ref 6–23)
CHLORIDE: 105 meq/L (ref 96–112)
CO2: 26 meq/L (ref 19–32)
Calcium: 10.1 mg/dL (ref 8.4–10.5)
Creatinine, Ser: 2 mg/dL — ABNORMAL HIGH (ref 0.40–1.50)
GFR: 32.94 mL/min — ABNORMAL LOW (ref >60.00)
GLUCOSE: 89 mg/dL (ref 70–99)
Potassium: 4.2 mEq/L (ref 3.5–5.1)
SODIUM: 140 meq/L (ref 135–145)
Total Protein: 6.6 g/dL (ref 6.0–8.3)

## 2018-02-18 NOTE — Assessment & Plan Note (Signed)
Renal ultrasound ordered today due to CRI, HTN and kidney stones

## 2018-02-18 NOTE — Assessment & Plan Note (Signed)
Encouraged heart healthy diet, increase exercise, avoid trans fats, consider a krill oil cap daily 

## 2018-02-18 NOTE — Assessment & Plan Note (Signed)
hgba1c acceptable, minimize simple carbs. Increase exercise as tolerated. Continue current meds 

## 2018-02-18 NOTE — Progress Notes (Signed)
Subjective:    Patient ID: David [name], male    DOB: 10-Nov-1945, 73 y.o.   MRN: [id number]  Chief Complaint  Patient presents with  . Procedure    Pt wants to discuss     HPI Patient is in today for follow up. He has lithotripsy scheduled for 02/28/2018. No hospitalizations since last visit. Denies CP/palp/SOB/HA/congestion/fevers/GI or c/o. Taking meds as prescribed. his stones are not terribly symptomatic but he is starting to have hydronephrosis per patient. No severe pain just some mild episodes of low belly and low back pain but more from doing yard work. No fevers and chills. No dysuria or hematuria. He is stressed due to increased stress from above but also his relationship with his wife is strained and now she has broken leg so she has needed his help and is not happy with this. Using up to Lorazepam twice daily when very stressed but not every day. With good results  Past Medical History:  Diagnosis Date  . Abnormal liver function 03/18/2013  . Adenoma of right adrenal gland 07/11/2002   2.4cm , noted on CT ABD  . Anxiety   . Arthritis of both knees 03/26/2016  . Astigmatism   . Atrial fibrillation ([place])    x1 successful cardioversions  . Back pain   . BCC (basal cell carcinoma of skin)    under right eye and right ear  . Cataract 09/29/2016  . Depression   . Diverticulitis   . Diverticulosis   . Fatty liver   . GERD (gastroesophageal reflux disease)   . Gout   . Hearing loss of both ears 03/26/2016  . History of chronic prostatitis    started at age 48  . History of kidney stones   . Hyperglycemia 03/29/2016  . Hyperlipidemia   . Hypertension   . IBS (irritable bowel syndrome) 03/18/2013  . Incomplete right bundle branch block (RBBB)   . Internal hemorrhoids   . Kidney lesion 06/07/2015   Right midportion, 1.1x1.1 cm hyperechoic, noted on [place] ABD  . LAFB (left anterior fascicular block)   . Lipoma of axilla 09/2016  . Liver lesion, right lobe 06/07/2015   2.5x2.4x2.4 cm hypoechoic lesion posterior aspect, noted on [place] ABD  . Low back pain 03/26/2016  . LVH (left ventricular hypertrophy) 08/06/2017   Moderate, noted on ECHO  . Medicare annual wellness visit, subsequent 03/12/2014  . Muscle cramps   . Nocturia   . OA (osteoarthritis)    Back, Hands, Neck  . Obesity 11/25/2007   Qualifier: Diagnosis of  By: Lenna Gilford MD, Deborra Medina   . Other malaise and fatigue 03/13/2013  . Premature ventricular complex   . Preventative health care 03/07/2015  . Renal insufficiency   . Scoliosis    Upper thoracic and lumbar  . Vitamin D deficiency    resolved    Past Surgical History:  Procedure Laterality Date  . CARDIOVERSION  07/31/2017  . CATARACT EXTRACTION, BILATERAL    . COLON SURGERY  2009   segmental sigmoid resection  . COLONOSCOPY    . CYSTOSCOPY/URETEROSCOPY/HOLMIUM LASER/STENT PLACEMENT Right 11/29/2017   Procedure: CYSTOSCOPY/RETROGRADE/URETEROSCOPY/HOLMIUM LASER/STENT PLACEMENT;  Surgeon: Kathie Rhodes, MD;  Location: [facility];  Service: Urology;  Laterality: Right;  . EYE SURGERY Bilateral 01/12/2017   cataract removal  . HOLMIUM LASER APPLICATION Right 5/46/5681   Procedure: HOLMIUM LASER APPLICATION;  Surgeon: Kathie Rhodes, MD;  Location[facility];  Service: Urology;  Laterality: Right;  . NOSE SURGERY  Submucous resection age 4  . NUCLEAR STRESS TEST  06/03/2009  . SKIN BIOPSY    . URETEROSCOPY WITH HOLMIUM LASER LITHOTRIPSY Right 09/24/2017   Procedure: CYSTOSCOPY, URETEROSCOPY WITH HOLMIUM LASER LITHOTRIPSY, STENT PLACEMENT;  Surgeon: Kathie Rhodes, MD;  Location: [facility];  Service: Urology;  Laterality: Right;    Family History  Problem Relation Age of Onset  . Heart disease Mother   . Hypertension Mother   . Stroke Mother   . Colon cancer Mother   . Breast cancer Mother   . Heart disease Father        pacemaker  . Aortic aneurysm Father   . Hypertension Father   . Heart disease Sister    . Atrial fibrillation Sister   . Obesity Sister   . Sleep apnea Sister   . Heart attack Brother   . Other Brother        muscle disease  . Arthritis Brother   . Stroke Brother   . Atrial fibrillation Brother   . Cancer Maternal Grandmother        ?  Marland Kitchen Heart attack Maternal Grandmother   . Diabetes Maternal Grandmother   . Cancer Maternal Grandfather        hodgin's lymphoma  . Heart attack Paternal Grandmother   . Anxiety disorder Paternal Grandmother   . Pneumonia Paternal Grandfather   . Heart attack Brother   . Atrial fibrillation Brother   . Atrial fibrillation Brother   . Heart attack Brother   . Hypertension Brother   . Hyperlipidemia Brother   . Heart attack Brother   . Other Brother        heart valve operation  . Atrial fibrillation Brother     Social History   Socioeconomic History  . Marital status: Married    Spouse name: Not on file  . Number of children: 3  . Years of education: Not on file  . Highest education level: Not on file  Occupational History  . Occupation: Nurse, adult: [employer]  . Financial resource strain: Not on file  . Food insecurity:    Worry: Not on file    Inability: Not on file  . Transportation needs:    Medical: Not on file    Non-medical: Not on file  Tobacco Use  . Smoking status: Former Smoker    Packs/day: 1.00    Years: 6.00    Pack years: 6.00    Types: Cigarettes    Start date: 01/12/1969  . Smokeless tobacco: Never Used  . Tobacco comment: [id number]  Substance and Sexual Activity  . Alcohol use: Not Currently    Comment: 2 drinks 5 nights a week   . Drug use: No  . Sexual activity: Yes  Lifestyle  . Physical activity:    Days per week: Not on file    Minutes per session: Not on file  . Stress: Not on file  Relationships  . Social connections:    Talks on phone: Not on file    Gets together: Not on file    Attends religious service: Not on file    Active member of club or organization: Not  on file    Attends meetings of clubs or organizations: Not on file    Relationship status: Not on file  . Intimate partner violence:    Fear of current or ex partner: Not on file    Emotionally abused: Not on file  Physically abused: Not on file    Forced sexual activity: Not on file  Other Topics Concern  . Not on file  Social History Narrative   The patient is married for the second time, he has 3 sons.   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   2 alcoholic beverages most days.   1 caffeinated beverage daily   No drug use no current tobacco use he is a prior smoker   12/24/2016    Outpatient Medications Prior to Visit  Medication Sig Dispense Refill  . acetaminophen (TYLENOL) 325 MG tablet Take 650 mg by mouth every 6 (six) hours as needed for moderate pain.     Marland Kitchen alum hydroxide-mag trisilicate (GAVISCON) 78-29 MG CHEW Chew 1 tablet by mouth daily as needed for indigestion or heartburn.     Marland Kitchen atenolol (TENORMIN) 25 MG tablet TAKE ONE TABLET BY MOUTH TWICE A DAY (Patient taking differently: Take 25 mg by mouth 2 (two) times daily. ) 180 tablet 2  . carboxymethylcellulose (REFRESH PLUS) 0.5 % SOLN Place 1 drop into both eyes daily.    . Colchicine 0.6 MG CAPS 1 tab po bid (Patient taking differently: Take 0.6 mg by mouth daily as needed (gout). 1 tab po bid) 60 capsule 0  . cyclobenzaprine (FLEXERIL) 5 MG tablet Take 1 tablet (5 mg total) by mouth 3 (three) times daily as needed for muscle spasms. 30 tablet 1  . fluticasone (FLONASE) 50 MCG/ACT nasal spray Place 2 sprays into the nose at bedtime. For congestion    . HYDROcodone-acetaminophen (NORCO) 10-325 MG tablet Take 1-2 tablets by mouth every 4 (four) hours as needed for moderate pain. Maximum dose per 24 hours - 8 pills 18 tablet 0  . LORazepam (ATIVAN) 1 MG tablet Take 0.5 tablets (0.5 mg total) by mouth 2 (two) times daily as needed. 90 tablet 1  . Menthol, Topical Analgesic, (ICY HOT BACK EX) Apply 1 application topically  daily as needed (back pain).    Marland Kitchen omeprazole (PRILOSEC) 20 MG capsule Take 20 mg by mouth as needed.    . phenazopyridine (PYRIDIUM) 200 MG tablet Take 1 tablet (200 mg total) by mouth 3 (three) times daily as needed for pain. 20 tablet 0  . Probiotic Product (PROBIOTIC DAILY PO) Take 1 capsule by mouth daily.     . sertraline (ZOLOFT) 50 MG tablet Take 1 tablet (50 mg total) by mouth daily. 30 tablet 3  . tamsulosin (FLOMAX) 0.4 MG CAPS capsule Take 0.4 mg by mouth daily after supper.    . traMADol (ULTRAM) 50 MG tablet Take 1 tablet (50 mg total) by mouth every 6 (six) hours as needed. 20 tablet 0   No facility-administered medications prior to visit.     Allergies  Allergen Reactions  . Cefaclor Hives  . Cephalosporins Hives  . Penicillins Rash    Mild maculopapular rash Has patient had a PCN reaction causing immediate rash, facial/tongue/throat swelling, SOB or lightheadedness with hypotension: No Has patient had a PCN reaction causing severe rash involving mucus membranes or skin necrosis: No Has patient had a PCN reaction that required hospitalization: No Has patient had a PCN reaction occurring within the last 10 years: No If all of the above answers are "NO", then may proceed with Cephalosporin use.     Review of Systems  Constitutional: Negative for fever and malaise/fatigue.  HENT: Negative for congestion.   Eyes: Negative for blurred vision.  Respiratory: Negative for shortness of breath.  Cardiovascular: Negative for chest pain, palpitations and leg swelling.  Gastrointestinal: Negative for abdominal pain, blood in stool and nausea.  Genitourinary: Negative for dysuria and frequency.  Musculoskeletal: Negative for falls.  Skin: Negative for rash.  Neurological: Negative for dizziness, loss of consciousness and headaches.  Endo/Heme/Allergies: Negative for environmental allergies.  Psychiatric/Behavioral: Negative for depression. The patient is not nervous/anxious.          Objective:    Physical Exam Vitals signs and nursing note reviewed.  Constitutional:      General: He is not in acute distress.    Appearance: He is well-developed.  HENT:     Head: Normocephalic and atraumatic.     Nose: Nose normal.  Eyes:     General:        Right eye: No discharge.        Left eye: No discharge.  Neck:     Musculoskeletal: Normal range of motion and neck supple.  Cardiovascular:     Rate and Rhythm: Normal rate and regular rhythm.     Heart sounds: No murmur.  Pulmonary:     Effort: Pulmonary effort is normal.     Breath sounds: Normal breath sounds.  Abdominal:     General: Bowel sounds are normal.     Palpations: Abdomen is soft.     Tenderness: There is no abdominal tenderness.  Skin:    General: Skin is warm and dry.  Neurological:     Mental Status: He is alert and oriented to person, place, and time.     BP 134/77   Pulse (!) 57   Temp 97.7 F (36.5 C) (Oral)   Resp 16   Ht 6' (1.829 m)   Wt 235 lb 6.4 oz (106.8 kg)   SpO2 98%   BMI 31.93 kg/m  Wt Readings from Last 3 Encounters:  02/18/18 235 lb 6.4 oz (106.8 kg)  02/03/18 234 lb (106.1 kg)  12/28/17 232 lb 6.4 oz (105.4 kg)     Lab Results  Component Value Date   WBC 6.1 12/28/2017   HGB 13.6 12/28/2017   HCT 41.0 12/28/2017   PLT 214.0 12/28/2017   GLUCOSE 99 12/28/2017   CHOL 202 (H) 10/08/2017   TRIG 94.0 10/08/2017   HDL 33.00 (L) 10/08/2017   LDLDIRECT 168.5 03/29/2006   LDLCALC 151 (H) 10/08/2017   ALT 20 12/28/2017   AST 18 12/28/2017   NA 138 12/28/2017   K 4.4 12/28/2017   CL 102 12/28/2017   CREATININE 1.47 12/28/2017   BUN 19 12/28/2017   CO2 29 12/28/2017   TSH 3.05 10/08/2017   PSA 3.36 11/15/2017   INR 1.39 11/14/2010   HGBA1C 5.7 10/08/2017    Lab Results  Component Value Date   TSH 3.05 10/08/2017   Lab Results  Component Value Date   WBC 6.1 12/28/2017   HGB 13.6 12/28/2017   HCT 41.0 12/28/2017   MCV 91.8 12/28/2017   PLT  214.0 12/28/2017   Lab Results  Component Value Date   NA 138 12/28/2017   K 4.4 12/28/2017   CO2 29 12/28/2017   GLUCOSE 99 12/28/2017   BUN 19 12/28/2017   CREATININE 1.47 12/28/2017   BILITOT 0.6 12/28/2017   ALKPHOS 70 12/28/2017   AST 18 12/28/2017   ALT 20 12/28/2017   PROT 6.7 12/28/2017   ALBUMIN 4.3 12/28/2017   CALCIUM 10.4 12/28/2017   ANIONGAP 8 10/03/2017   GFR 49.96 (L) 12/28/2017   Lab Results  Component Value Date   CHOL 202 (H) 10/08/2017   Lab Results  Component Value Date   HDL 33.00 (L) 10/08/2017   Lab Results  Component Value Date   LDLCALC 151 (H) 10/08/2017   Lab Results  Component Value Date   TRIG 94.0 10/08/2017   Lab Results  Component Value Date   CHOLHDL 6 10/08/2017   Lab Results  Component Value Date   HGBA1C 5.7 10/08/2017       Assessment & Plan:   Problem List Items Addressed This Visit    Hyperlipidemia, mixed    Encouraged heart healthy diet, increase exercise, avoid trans fats, consider a krill oil cap daily      Essential hypertension, benign    Well controlled, no changes to meds. Encouraged heart healthy diet such as the DASH diet and exercise as tolerated.       Renal insufficiency    Hydrate and monitor      Hyperglycemia    hgba1c acceptable, minimize simple carbs. Increase exercise as tolerated. Continue current meds         I am having David [name] "David [name]" maintain his fluticasone, alum hydroxide-mag trisilicate, cyclobenzaprine, atenolol, Colchicine, acetaminophen, tamsulosin, carboxymethylcellulose, (Menthol, Topical Analgesic, (ICY HOT BACK EX)), Probiotic Product (PROBIOTIC DAILY PO), HYDROcodone-acetaminophen, phenazopyridine, omeprazole, traMADol, sertraline, and LORazepam.  No orders of the defined types were placed in this encounter.    Penni Homans, MD

## 2018-02-18 NOTE — Assessment & Plan Note (Signed)
Well controlled, no changes to meds. Encouraged heart healthy diet such as the DASH diet and exercise as tolerated.  °

## 2018-02-18 NOTE — Assessment & Plan Note (Signed)
Hydrate and monitor 

## 2018-02-18 NOTE — Patient Instructions (Signed)
Hydronephrosis  Hydronephrosis is the swelling of one or both kidneys due to a blockage that stops urine from flowing out of the body. Kidneys filter waste from the blood and produce urine. This condition can lead to kidney failure and may become life threatening if not treated promptly. What are the causes? Common causes of this condition include:  Problems that occur when a baby is developing in the womb (congenital defect). These can include problems: ? In the kidneys. ? In the tubes that drain urine from the kidneys into the bladder (ureters).  Kidney stones.  Bladder infection.  An enlarged prostate gland.  Scar tissue from a previous surgery or injury.  A blood clot.  A tumor or cyst in the abdomen or pelvis.  Cancer of the prostate, bladder, uterus, ovary, or colon. What are the signs or symptoms? Symptoms of this condition include:  Pain or discomfort in your side (flank).  Pain and swelling in your abdomen.  Nausea and vomiting.  Fever.  Pain when passing urine.  Feelings of urgency when you need to urinate.  Urinating more often than normal. In some cases, you may not have any symptoms. How is this diagnosed? This condition may be diagnosed based on:  Your symptoms and medical history.  A physical exam.  Blood and urine tests.  Imaging tests, such as an ultrasound, CT scan, or MRI.  A procedure in which a scope is inserted into the urethra and used to view parts of the urinary tract and bladder (cystoscopy). How is this treated? Treatment for this condition depends on where the blockage is, how long it has been there, and what caused it. The goal of treatment is to remove the blockage. Treatment may include:  Antibiotic medicines to treat or prevent infection.  A procedure to place a small, thin tube (stent) into a blocked ureter. The stent will keep the ureter open so that urine can drain through it.  A nonsurgical procedure that crushes kidney  stones with shock waves (extracorporeal shock wave lithotripsy).  If kidney failure occurs, treatment may include dialysis or a kidney transplant. Follow these instructions at home:   Take over-the-counter and prescription medicines only as told by your health care provider.  Rest and return to your normal activities as told by your health care provider. Ask your health care provider what activities are safe for you.  Drink enough fluid to keep your urine pale yellow.  If you were prescribed an antibiotic medicine, take it exactly as told by your health care provider. Do not stop taking the antibiotic even if you start to feel better.  Keep all follow-up visits as told by your health care provider. This is important. Contact a health care provider if:  You continue to have symptoms after treatment.  You develop new symptoms.  Your urine becomes cloudy or bloody.  You have a fever. Get help right away if:  You have severe flank or abdominal pain.  You cannot drink fluids without vomiting. Summary  Hydronephrosis is the swelling of one or both kidneys due to a blockage that stops urine from flowing out of the body.  Hydronephrosis can lead to kidney failure and may become life threatening if not treated promptly.  The goal of treatment is to treat the cause of the blockage. It may include insertion of stent into a blocked ureter, a procedure to treat kidney stones, and antibiotic medicines.  Follow your health care provider's instructions for taking care of yourself at   failure and may become life threatening if not treated promptly.  · The goal of treatment is to treat the cause of the blockage. It may include insertion of stent into a blocked ureter, a procedure to treat kidney stones, and antibiotic medicines.  · Follow your health care provider's instructions for taking care of yourself at home, including instructions about drinking fluids, taking medicines, and limiting activities.  This information is not intended to replace advice given to you by your health care provider. Make sure you discuss any questions you have with your health care provider.  Document Released: 10/26/2006 Document Revised: 01/09/2017 Document Reviewed: 01/09/2017  Elsevier Interactive Patient Education © 2019 [employer]  [employer]

## 2018-02-21 NOTE — Addendum Note (Signed)
Addended by: Magdalene Molly A on: 02/21/2018 03:04 PM   Modules accepted: Orders

## 2018-02-23 ENCOUNTER — Ambulatory Visit (HOSPITAL_BASED_OUTPATIENT_CLINIC_OR_DEPARTMENT_OTHER)
Admission: RE | Admit: 2018-02-23 | Discharge: 2018-02-23 | Disposition: A | Discharge status: Home / Self Care | Payer: Medicare Other | Source: Ambulatory Visit | Attending: Family Medicine | Admitting: Family Medicine

## 2018-02-23 DIAGNOSIS — I10 Essential (primary) hypertension: Secondary | ICD-10-CM | POA: Diagnosis present

## 2018-02-23 DIAGNOSIS — N200 Calculus of kidney: Secondary | ICD-10-CM | POA: Diagnosis present

## 2018-02-24 ENCOUNTER — Encounter (HOSPITAL_BASED_OUTPATIENT_CLINIC_OR_DEPARTMENT_OTHER): Payer: Self-pay | Admitting: *Deleted

## 2018-02-24 ENCOUNTER — Encounter: Payer: Self-pay | Admitting: Family Medicine

## 2018-02-24 ENCOUNTER — Other Ambulatory Visit: Payer: Self-pay

## 2018-02-24 NOTE — Progress Notes (Signed)
SPOKE WITH David [name] NPO AFTER MIDNIGHT, ARRIVE 630A 02-28-2018 [facility] MEDS TO TAKE SIP OF WATER: ESTYLENOL PRN, LORAZEPAM PRN, ATENOLOL, EYE DROPS DRIVER SON David [name] RECORDS ON CHART/EPIC:CBC WITH DIF, CMET DONE 02-18-2018, EKG 87-26-19, ECHO 08-06-17, LOV DR HOCHREIN 10-12-17 HAS SURGERY ORDERS IN EPIC

## 2018-02-25 ENCOUNTER — Encounter (HOSPITAL_BASED_OUTPATIENT_CLINIC_OR_DEPARTMENT_OTHER): Payer: Self-pay | Admitting: Anesthesiology

## 2018-02-25 NOTE — Anesthesia Preprocedure Evaluation (Addendum)
Anesthesia Evaluation  Patient identified by MRN, date of birth, ID band Patient awake    Reviewed: Allergy & Precautions, NPO status , Patient's Chart, lab work & pertinent test results, reviewed documented beta blocker date and time   Airway Mallampati: II  TM Distance: >3 FB Neck ROM: Full    Dental no notable dental hx. (+) Teeth Intact   Pulmonary former smoker,    Pulmonary exam normal breath sounds clear to auscultation       Cardiovascular hypertension, Pt. on medications and Pt. on home beta blockers + dysrhythmias Atrial Fibrillation  Rhythm:Regular Rate:Bradycardia  No recent episodes of A. Fib- last in 2012   Neuro/Psych PSYCHIATRIC DISORDERS Anxiety Depression    GI/Hepatic GERD  Medicated and Controlled,Hx/o diverticulosis   Endo/Other  Obesity Hyperlipidemia Gout  Renal/GU Renal InsufficiencyRenal diseaseRight ureteral stricture with right hydronephrosis Hx/o renal calculi  negative genitourinary   Musculoskeletal  (+) Arthritis , Osteoarthritis,  Scoliosis thoracic and lumbar spine   Abdominal (+) + obese,   Peds  Hematology negative hematology ROS (+)   Anesthesia Other Findings   Reproductive/Obstetrics                           Anesthesia Physical Anesthesia Plan  ASA: III  Anesthesia Plan: General   Post-op Pain Management:    Induction: Intravenous  PONV Risk Score and Plan: 3 and Midazolam, Ondansetron, Treatment may vary due to age or medical condition and Propofol infusion  Airway Management Planned: LMA  Additional Equipment:   Intra-op Plan:   Post-operative Plan: Extubation in OR  Informed Consent: I have reviewed the patients History and Physical, chart, labs and discussed the procedure including the risks, benefits and alternatives for the proposed anesthesia with the patient or authorized representative who has indicated his/her understanding and  acceptance.     Dental advisory given  Plan Discussed with: CRNA and Surgeon  Anesthesia Plan Comments:        Anesthesia Quick Evaluation

## 2018-02-25 NOTE — H&P (Signed)
HPI: David [name] is a 73 year-old male with right hydronephrosis.  Calculus disease: CT scan on 08/18/17 revealed an 8 mm stone in the right ureter at the level of the L5-S1 interspace with Hounsfield units of 1100. Bilateral, nonobstructing renal calculi were noted as well.   10/07/17: He underwent right ureteroscopy and laser lithotripsy with dusting of a right ureteral calculus on 09/24/17. A stent was left in placed and was removed on 09/30/17. He experienced renal colic with a stent was removed.  He notes that he continues this still see some hematuria. He is also having some pain in the right lower quadrant and lower abdomen that he describes a cramping type sensation. He said he was seen in the ER and was placed on Bactrim. When he was seen in the ER on 10/03/17 his CBC was normal with a white count of 4.0, his creatinine was 1.63 which was stable and his urinalysis was nitrite positive but was discolored I and he was only noted to have a few bacteria and a urine culture was performed which was found to be negative.   10/13/17: He has returned today indicating that overall he seems to be doing a little bit better but still has not made the progress that he thought he would. Blood in his urine has decreased. He is still having some discomfort in the right lower quadrant that radiates into the bladder and now actually into the area of the urethra. In addition he said he has noted decreased energy levels but he said yesterday felt better than he has in quite some time. He has increased his fluid intake dramatically. He also said he has seen some "silt" pass.   10/21/17: He returns today for follow up. Overall he states that symptoms continue to improved following most recent office visit. He continues to see what he describes as "silt" like material pass intermittently, with noticeably more over the past week. He continues to have some intermittent RLQ pain that radiates into his right groin at times.  Currently he feels that his pain is manageable. He is no longer using narcotic pain medication and is able to manage with Tylenol. He denies any difficulties voiding today. He denies gross hematuria, but does have some discomfort associated with voiding intermittently. He denies fever, chills, nausea, or vomiting.   11/24/17: He reports to me today that he is having much less discomfort. He says he occasionally has what he describes as a "twinge". He also has some low back pain a little bit more on the right but does have some degenerative disease of the spine. He has not seen any hematuria.   12/16/17: He underwent right URS for the remaining ureteral calculi on 11/18. There was some stricturing of the ureter at the location of the treated calculi. It was thought this may be inflammatory in nature but actual stricture disease could not be completely excluded. It was determined that a ureteral stent be left in place for 6 weeks before determination to remove it would be made.   He walks in today with concerns regarding lower abdominal/suprapubic discomfort related to obtain per the patient as well as intermittent increased urgency and small volume voids. He has not had burning or painful urination. Denies gross hematuria. No new acute flank and lower back pain. Denies fevers. He tells me that over the past 24 hours by increasing water intake, his stream is more pronounced and urgency is much less severe. The patient continues tamsulosin.  01/10/18: He returns today for scheduled follow up and possible ureteral stent removal from above noted procedure on 11/18. He denies any current flank or abdominal pain. He will have some right flank discomfort, at times. He does have some irritative symptoms in regards to increased urgency and frequency related to ureteral stent. He denies gross hematuria, dysuria, fevers, chills, nausea, or vomiting.   02/09/18: He returns today for follow up with RUS. Overall, he  states that he has been doing well following recent procedure and ureteral stent removal. He denies any current flank or abdominal pain. He denies exacerbation of voiding symptoms or difficulties voiding. No gross hematuria, dysuria, fevers, chills, nausea, or vomiting.   The patient's stone was on his right side. He is currently having flank pain and groin pain. He denies having back pain, nausea, vomiting, fever, and chills. He does have a burning sensation when he urinates.   He has had ESWL, Ureteral Stent, and Ureteroscopy for treatment of his stones in the past. This condition would be considered of mild to moderate severity with no modifying factors or associated signs or symptoms other than as noted above.     ALLERGIES: Cefaclor CAPS Cephalosporins Penicillins    MEDICATIONS: Atenolol 100 mg tablet Oral  Colchicine 0.6 MG Oral Tablet Oral  Flexeril 5 mg tablet Oral  Flonase 50 MCG/ACT Nasal Suspension Nasal  Lorazepam 1 mg tablet Oral     GU PSH: Cysto Remove Stent FB Sim - 01/10/2018 Ureteroscopic laser litho, Right - 11/29/2017, Right - 09/24/2017      PSH Notes: Small Bowel Resection, Nose Surgery   NON-GU PSH: Nose Surgery (Unspecified) Small bowel resection - 2012    GU PMH: Ureteral calculus - 01/10/2018, Right, He had a large stone dusted and has developed a Steinstrasse. We discussed the options including stent placement, ureteroscopy and stone removal versus conservative management and he would like to try to avoid a 2nd procedure so I am going to place him on tamsulosin and he will return in 2 weeks for a KUB., - 10/07/2017 (Stable, Acute), Right, Because his stone is very difficult to visualize on a plain film I do not feel he would be a good candidate for lithotripsy and therefore have discussed proceeding with ureteroscopy due to the fact that the stone has failed to progress. He will come off of his Xarelto 2 days prior to procedure., - 09/08/2017 (Acute), Right,  After discussing the options for management of his ureteral calculus he has elected to proceed with medical expulsive therapy. I told him there was probably going to be a low probability of spontaneous passage although he is not obstructed and not having any pain so I will reimage him again in 2 weeks. If his stone persists he told me he would per for lithotripsy over ureteroscopy., - 08/25/2017 Ureteral stricture, Right - 12/16/2017 Hydronephrosis, He does have hydronephrosis today but he is not having a lot of pain and his creatinine remains stable. This is secondary to his Steinstrasse. - 10/07/2017 Hematospermia (Worsening), We discussed the benign nature of hematospermia. With a reportedly normal DRE and a PSA that is normal at 3.16 as well as a negative CT scan further evaluation is not necessary at this time. - 08/25/2017, We discussed the benign nature of amounts permit. This has occurred in the past. He has no voiding symptoms other than some mild nocturia and slowing of his urinary stream. He has not had a PSA done so we will check that but  I found no abnormality on his DRE. I did offer finasteride as an option off label but he did not want to consider this., - 2018, Hematospermia, - 2014 Renal calculus, Bilateral, He was found to have bilateral, nonobstructing renal calculi by CT scan. - 08/25/2017 BPH w/LUTS, He does have some BPH by exam. - 2018 Encounter for Prostate Cancer screening, I will check a screening PSA today especially because he has had hemospermia. - 2018, Prostate cancer screening, - 2014 Nocturia, His nocturia and slowing of his urinary stream are not enough of a bother for him that he would want to consider pharmacologic therapy at this time. - 2018 Adrenal mass Unspec, Adrenal cortical adenoma, unspecified laterality - 2014 BPH w/o LUTS, Benign prostatic hypertrophy without lower urinary tract symptoms - 2014      PMH Notes: Hematospermia/hematuria: In 12/12 the patient had  reported an episode of hemospermia and then described the initiation of his urinary stream after that an ejaculation as containing blood. He does have a past history of prostatitis. He also has had atrial fibrillation and been on Pradaxa. He stopped the Peradaxa and about a week later is when he saw what he describes as a single drop of blood in his semen. When he urinated the next time he saw a small amount of blood at the beginning of his urinary stream but no further blood in the urine. This pattern repeated itself a second time and has not recurred since. He has no discomfort with ejaculation.   NON-GU PMH: Anxiety, Anxiety - 2014 Personal history of other diseases of the circulatory system, History of hypertension - 2014 Personal history of other endocrine, nutritional and metabolic disease, History of hypercholesterolemia - 2014 Personal history of other mental and behavioral disorders, History of depression - 2014 Personal history of other specified conditions, History of heartburn - 2014    FAMILY HISTORY: Acute Myocardial Infarction - Brother, Brother Death - Mother, Father Death In The Family Father - Sister Death In The Family Mother - Sister Family Health Status Number - Sister Heart Disease - Sister   SOCIAL HISTORY: Marital Status: Married Preferred Language: English; Ethnicity: Not Hispanic Or Latino; Race: White Current Smoking Status: Patient smokes occasionally.   Tobacco Use Assessment Completed: Used Tobacco in last 30 days? Does drink.  Does not drink caffeine. Patient's occupation is/was Retired.     Notes: Occupation:, Alcohol Use, Caffeine Use, Marital History - Currently Married, Tobacco Use   REVIEW OF SYSTEMS:    GU Review Male:   Patient reports get up at night to urinate. Patient denies frequent urination, hard to postpone urination, burning/ pain with urination, leakage of urine, stream starts and stops, trouble starting your stream, have to strain to urinate  , erection problems, and penile pain.  Gastrointestinal (Upper):   Patient denies nausea, vomiting, and indigestion/ heartburn.  Gastrointestinal (Lower):   Patient reports constipation. Patient denies diarrhea.  Constitutional:   Patient denies fever, night sweats, weight loss, and fatigue.  Skin:   Patient denies skin rash/ lesion and itching.  Eyes:   Patient denies blurred vision and double vision.  Ears/ Nose/ Throat:   Patient denies sore throat and sinus problems.  Hematologic/Lymphatic:   Patient denies swollen glands and easy bruising.  Cardiovascular:   Patient denies leg swelling and chest pains.  Respiratory:   Patient denies cough and shortness of breath.  Endocrine:   Patient denies excessive thirst.  Musculoskeletal:   Patient denies back pain and joint pain.  Neurological:   Patient denies headaches and dizziness.  Psychologic:   Patient reports anxiety. Patient denies depression.   VITAL SIGNS:   Weight 243 lb / 110.22 kg  Height 72 in / 182.88 cm  BP 147/81 mmHg  Pulse 59 /min  Temperature 98.3 F / 36.8 C  BMI 33.0 kg/m   Physical Exam  Constitutional: Well nourished and well developed . No acute distress.   ENT:. The ears and nose are normal in appearance.   Neck: The appearance of the neck is normal and no neck mass is present.   Pulmonary: No respiratory distress and normal respiratory rhythm and effort.   Cardiovascular: Heart rate and rhythm are normal . No peripheral edema.   Abdomen: The abdomen is soft and nontender. No masses are palpated. No CVA tenderness. No hernias are palpable. No hepatosplenomegaly noted.   Rectal: Rectal exam demonstrates normal sphincter tone, no tenderness and no masses. The prostate has no nodularity and is not tender. The left seminal vesicle is nonpalpable. The right seminal vesicle is nonpalpable. The perineum is normal on inspection.   Genitourinary: Examination of the penis demonstrates no discharge, no masses,  no lesions and a normal meatus. The scrotum is without lesions. The right epididymis is palpably normal and non-tender. The left epididymis is palpably normal and non-tender. The right testis is non-tender and without masses. The left testis is non-tender and without masses.   Lymphatics: The femoral and inguinal nodes are not enlarged or tender.   Skin: Normal skin turgor, no visible rash and no visible skin lesions.   Neuro/Psych:. Mood and affect are appropriate.     PAST DATA REVIEWED:  Source Of History:  Patient  Records Review:   Previous Patient Records  Urine Test Review:   Urinalysis, Urine Culture  X-Ray Review: KUB: Reviewed Films.  Renal Ultrasound: Reviewed Films.  C.T. Abdomen/Pelvis: Reviewed Films. Reviewed Report.     03/30/17 02/03/16 01/08/11  PSA  Total PSA 3.16 ng/dl 2.02 ng/dl 1.29     PROCEDURES:         KUB - 74018  A single view of the abdomen is obtained. Small, stable opacity noted in the vicinity of the right lower pole. Along the course of the right ureter, there are some opacities noted just inferior to the pelvis. Dr. Karsten Ro did note in his operative note that there did appear to be some stone fragments that had migrated outside of the ureter at the time of his procedure. No obvious opacities noted within the confines of the left renal shadow or along the expected anatomical course of the left ureter. Multiple pelvic phleboliths, which appear stable.                 Renal Ultrasound - 47654  Right Kidney: Length: 12.1 cm Depth: 6.2 cm Cortical Width: 1.1 cm Width: 5.8 cm  Left Kidney: Length: 11.0 cm Depth: 5.5 cm Cortical Width: 1.6 cm Width: 5.2 cm  Left Kidney/Ureter:  Appears WNL.   Right Kidney/Ureter:  Hydronephrosis noted. -------- 0.4 cm echogenic focus in the mid kidney ( calcification vs vessel).   Bladder:  PVR = 6.6 ml.                Urinalysis Dipstick Dipstick Cont'd  Color: Yellow Bilirubin: Neg mg/dL  Appearance:  Clear Ketones: Neg mg/dL  Specific Gravity: 1.025 Blood: Neg ery/uL  pH: 5.5 Protein: Neg mg/dL  Glucose: Neg mg/dL Urobilinogen: 0.2 mg/dL    Nitrites: Neg  Leukocyte Esterase: Neg leu/uL    ASSESSMENT/PLAN:      ICD-10 Details  1 GU:   Ureteral calculus - N20.1   2   Hydronephrosis - N13.0   3   Ureteral stricture - N13.5 Right    Urinalysis is without infectious parameters or hematuria. On RUS his left kidney appears to be WNL. However, there continues to be hydronephrosis noted on the right. KUB imaging today shows a small, stable opacity noted in the vicinity of the right lower pole. Along the course of the right ureter, there are some opacities noted just inferior to the pelvis. Dr. Karsten Ro did note in his operative note that there did appear to be some stone fragments that had migrated outside of the ureter at the time of his procedure.   With the presence of persistent right-sided hydronephrosis I told him there is no doubt this needs to be evaluated and potentially rectified with the understanding that there may be scar tissue and or other causes of the obstruction that I cannot address and this may require the placement of a nephrostomy tube.  I felt that the least invasive means to address this and also visually allow me to determine the potential cause would be with ureteroscopy and retrograde pyelography.  If it turns out that there was still ureteral stone material I could also address that at the time.  My plan will be to perform cystoscopy and retrograde pyelogram with right ureteroscopy as well as possible manipulation of any stone material, dilation of any strictured areas and placement of a double-J stent.

## 2018-02-28 ENCOUNTER — Ambulatory Visit (HOSPITAL_BASED_OUTPATIENT_CLINIC_OR_DEPARTMENT_OTHER)
Admission: RE | Admit: 2018-02-28 | Discharge: 2018-02-28 | Disposition: A | Discharge status: Home / Self Care | Payer: Medicare Other | Attending: Urology | Admitting: Urology

## 2018-02-28 ENCOUNTER — Encounter (HOSPITAL_BASED_OUTPATIENT_CLINIC_OR_DEPARTMENT_OTHER)
Admission: RE | Disposition: A | Discharge status: Home / Self Care | Payer: Self-pay | Source: Home / Self Care | Attending: Urology

## 2018-02-28 ENCOUNTER — Ambulatory Visit (HOSPITAL_BASED_OUTPATIENT_CLINIC_OR_DEPARTMENT_OTHER): Payer: Medicare Other | Admitting: Anesthesiology

## 2018-02-28 ENCOUNTER — Encounter: Payer: Self-pay | Admitting: Family Medicine

## 2018-02-28 ENCOUNTER — Other Ambulatory Visit (HOSPITAL_COMMUNITY): Payer: Self-pay | Admitting: Urology

## 2018-02-28 ENCOUNTER — Other Ambulatory Visit: Payer: Self-pay

## 2018-02-28 DIAGNOSIS — F329 Major depressive disorder, single episode, unspecified: Secondary | ICD-10-CM | POA: Insufficient documentation

## 2018-02-28 DIAGNOSIS — N135 Crossing vessel and stricture of ureter without hydronephrosis: Secondary | ICD-10-CM

## 2018-02-28 DIAGNOSIS — K219 Gastro-esophageal reflux disease without esophagitis: Secondary | ICD-10-CM | POA: Insufficient documentation

## 2018-02-28 DIAGNOSIS — Z87442 Personal history of urinary calculi: Secondary | ICD-10-CM | POA: Insufficient documentation

## 2018-02-28 DIAGNOSIS — Z79899 Other long term (current) drug therapy: Secondary | ICD-10-CM | POA: Insufficient documentation

## 2018-02-28 DIAGNOSIS — E669 Obesity, unspecified: Secondary | ICD-10-CM | POA: Insufficient documentation

## 2018-02-28 DIAGNOSIS — Z6831 Body mass index (BMI) 31.0-31.9, adult: Secondary | ICD-10-CM | POA: Diagnosis not present

## 2018-02-28 DIAGNOSIS — Z880 Allergy status to penicillin: Secondary | ICD-10-CM | POA: Diagnosis not present

## 2018-02-28 DIAGNOSIS — M109 Gout, unspecified: Secondary | ICD-10-CM | POA: Insufficient documentation

## 2018-02-28 DIAGNOSIS — N132 Hydronephrosis with renal and ureteral calculous obstruction: Secondary | ICD-10-CM | POA: Insufficient documentation

## 2018-02-28 DIAGNOSIS — I10 Essential (primary) hypertension: Secondary | ICD-10-CM | POA: Insufficient documentation

## 2018-02-28 DIAGNOSIS — Z881 Allergy status to other antibiotic agents status: Secondary | ICD-10-CM | POA: Diagnosis not present

## 2018-02-28 DIAGNOSIS — N1339 Other hydronephrosis: Secondary | ICD-10-CM

## 2018-02-28 DIAGNOSIS — F17200 Nicotine dependence, unspecified, uncomplicated: Secondary | ICD-10-CM | POA: Insufficient documentation

## 2018-02-28 DIAGNOSIS — Z7951 Long term (current) use of inhaled steroids: Secondary | ICD-10-CM | POA: Diagnosis not present

## 2018-02-28 DIAGNOSIS — F419 Anxiety disorder, unspecified: Secondary | ICD-10-CM | POA: Diagnosis not present

## 2018-02-28 HISTORY — PX: CYSTOSCOPY WITH URETEROSCOPY AND STENT PLACEMENT: SHX6377

## 2018-02-28 HISTORY — DX: Disorder of penis, unspecified: N48.9

## 2018-02-28 SURGERY — CYSTOURETEROSCOPY, WITH STENT INSERTION
Anesthesia: General | Site: Renal | Laterality: Right

## 2018-02-28 MED ORDER — LIDOCAINE 2% (20 MG/ML) 5 ML SYRINGE
INTRAMUSCULAR | Status: AC
  Filled 2018-02-28: qty 5

## 2018-02-28 MED ORDER — CIPROFLOXACIN IN D5W 400 MG/200ML IV SOLN
INTRAVENOUS | Status: AC
  Filled 2018-02-28: qty 200

## 2018-02-28 MED ORDER — FENTANYL CITRATE (PF) 100 MCG/2ML IJ SOLN
INTRAMUSCULAR | Status: DC | PRN
  Administered 2018-02-28: 100 ug via INTRAVENOUS

## 2018-02-28 MED ORDER — CIPROFLOXACIN IN D5W 400 MG/200ML IV SOLN
400.0000 mg | Freq: Once | INTRAVENOUS | Status: AC
  Administered 2018-02-28: 400 mg via INTRAVENOUS
  Filled 2018-02-28: qty 200

## 2018-02-28 MED ORDER — TAMSULOSIN HCL 0.4 MG PO CAPS
0.4000 mg | ORAL_CAPSULE | Freq: Once | ORAL | Status: AC
  Administered 2018-02-28: 0.4 mg via ORAL
  Filled 2018-02-28: qty 1

## 2018-02-28 MED ORDER — DEXAMETHASONE SODIUM PHOSPHATE 4 MG/ML IJ SOLN
INTRAMUSCULAR | Status: DC | PRN
  Administered 2018-02-28: 10 mg via INTRAVENOUS

## 2018-02-28 MED ORDER — GLYCOPYRROLATE PF 0.2 MG/ML IJ SOSY
PREFILLED_SYRINGE | INTRAMUSCULAR | Status: AC
  Filled 2018-02-28: qty 1

## 2018-02-28 MED ORDER — PROPOFOL 10 MG/ML IV BOLUS
INTRAVENOUS | Status: AC
  Filled 2018-02-28: qty 20

## 2018-02-28 MED ORDER — DEXAMETHASONE SODIUM PHOSPHATE 10 MG/ML IJ SOLN
INTRAMUSCULAR | Status: AC
  Filled 2018-02-28: qty 1

## 2018-02-28 MED ORDER — FENTANYL CITRATE (PF) 100 MCG/2ML IJ SOLN
25.0000 ug | INTRAMUSCULAR | Status: DC | PRN
  Filled 2018-02-28: qty 1

## 2018-02-28 MED ORDER — PROPOFOL 10 MG/ML IV BOLUS
INTRAVENOUS | Status: DC | PRN
  Administered 2018-02-28: 200 mg via INTRAVENOUS

## 2018-02-28 MED ORDER — LIDOCAINE HCL (CARDIAC) PF 100 MG/5ML IV SOSY
PREFILLED_SYRINGE | INTRAVENOUS | Status: DC | PRN
  Administered 2018-02-28: 80 mg via INTRAVENOUS

## 2018-02-28 MED ORDER — HYDROCODONE-ACETAMINOPHEN 7.5-325 MG PO TABS: 1.0000 | ORAL_TABLET | ORAL | 0 refills | Status: DC | PRN

## 2018-02-28 MED ORDER — FENTANYL CITRATE (PF) 100 MCG/2ML IJ SOLN
INTRAMUSCULAR | Status: AC
  Filled 2018-02-28: qty 2

## 2018-02-28 MED ORDER — GLYCOPYRROLATE 0.2 MG/ML IJ SOLN
INTRAMUSCULAR | Status: DC | PRN
  Administered 2018-02-28: 0.2 mg via INTRAVENOUS

## 2018-02-28 MED ORDER — ONDANSETRON HCL 4 MG/2ML IJ SOLN
4.0000 mg | Freq: Once | INTRAMUSCULAR | Status: DC | PRN
  Filled 2018-02-28: qty 2

## 2018-02-28 MED ORDER — HYDROCODONE-ACETAMINOPHEN 7.5-325 MG PO TABS
1.0000 | ORAL_TABLET | Freq: Once | ORAL | Status: AC | PRN
  Administered 2018-02-28: 1 via ORAL
  Filled 2018-02-28: qty 1

## 2018-02-28 MED ORDER — ONDANSETRON HCL 4 MG/2ML IJ SOLN
INTRAMUSCULAR | Status: DC | PRN
  Administered 2018-02-28: 4 mg via INTRAVENOUS

## 2018-02-28 MED ORDER — HYDROCODONE-ACETAMINOPHEN 7.5-325 MG PO TABS
ORAL_TABLET | ORAL | Status: AC
  Filled 2018-02-28: qty 1

## 2018-02-28 MED ORDER — EPHEDRINE 5 MG/ML INJ
INTRAVENOUS | Status: AC
  Filled 2018-02-28: qty 10

## 2018-02-28 MED ORDER — ONDANSETRON HCL 4 MG/2ML IJ SOLN
INTRAMUSCULAR | Status: AC
  Filled 2018-02-28: qty 2

## 2018-02-28 MED ORDER — IOHEXOL 300 MG/ML  SOLN
INTRAMUSCULAR | Status: DC | PRN
  Administered 2018-02-28: 10 mL

## 2018-02-28 MED ORDER — SODIUM CHLORIDE 0.9 % IR SOLN
Status: DC | PRN
  Administered 2018-02-28: 3000 mL

## 2018-02-28 MED ORDER — EPHEDRINE SULFATE 50 MG/ML IJ SOLN
INTRAMUSCULAR | Status: DC | PRN
  Administered 2018-02-28 (×2): 10 mg via INTRAVENOUS

## 2018-02-28 MED ORDER — MEPERIDINE HCL 25 MG/ML IJ SOLN
6.2500 mg | INTRAMUSCULAR | Status: DC | PRN
  Filled 2018-02-28: qty 1

## 2018-02-28 MED ORDER — TAMSULOSIN HCL 0.4 MG PO CAPS
ORAL_CAPSULE | ORAL | Status: AC
  Filled 2018-02-28: qty 1

## 2018-02-28 MED ORDER — SODIUM CHLORIDE 0.9 % IV SOLN
INTRAVENOUS | Status: DC
  Administered 2018-02-28: 1000 mL via INTRAVENOUS
  Filled 2018-02-28: qty 1000

## 2018-02-28 SURGICAL SUPPLY — 25 items
BAG DRAIN URO-CYSTO SKYTR STRL (DRAIN) ×2 IMPLANT
BASKET ZERO TIP NITINOL 2.4FR (BASKET) IMPLANT
CATH INTERMIT  6FR 70CM (CATHETERS) ×2 IMPLANT
CATH URET 5FR 28IN CONE TIP (BALLOONS)
CATH URET 5FR 70CM CONE TIP (BALLOONS) IMPLANT
CLOTH BEACON ORANGE TIMEOUT ST (SAFETY) ×2 IMPLANT
EXTRACTOR STONE 1.7FRX115CM (UROLOGICAL SUPPLIES) IMPLANT
FIBER LASER FLEXIVA 365 (UROLOGICAL SUPPLIES) IMPLANT
FIBER LASER TRAC TIP (UROLOGICAL SUPPLIES) IMPLANT
GLOVE BIO SURGEON STRL SZ8 (GLOVE) ×2 IMPLANT
GLOVE BIOGEL PI IND STRL 7.0 (GLOVE) ×2 IMPLANT
GLOVE BIOGEL PI INDICATOR 7.0 (GLOVE) ×2
GOWN STRL REUS W/TWL XL LVL3 (GOWN DISPOSABLE) ×2 IMPLANT
GUIDEWIRE ANG ZIPWIRE 038X150 (WIRE) ×2 IMPLANT
GUIDEWIRE STR DUAL SENSOR (WIRE) ×2 IMPLANT
INFUSOR MANOMETER BAG 3000ML (MISCELLANEOUS) ×2 IMPLANT
IV NS IRRIG 3000ML ARTHROMATIC (IV SOLUTION) ×2 IMPLANT
KIT TURNOVER CYSTO (KITS) ×2 IMPLANT
MANIFOLD NEPTUNE II (INSTRUMENTS) IMPLANT
NS IRRIG 500ML POUR BTL (IV SOLUTION) ×2 IMPLANT
PACK CYSTO (CUSTOM PROCEDURE TRAY) ×2 IMPLANT
SHEATH URET ACCESS 12FR/35CM (UROLOGICAL SUPPLIES) ×2 IMPLANT
TUBE CONNECTING 12X1/4 (SUCTIONS) IMPLANT
TUBING UROLOGY SET (TUBING) ×2 IMPLANT
WATER STERILE IRR 3000ML UROMA (IV SOLUTION) IMPLANT

## 2018-02-28 NOTE — Anesthesia Procedure Notes (Signed)
Procedure Name: LMA Insertion Date/Time: 02/28/2018 8:27 AM Performed by: Justice Rocher, CRNA Pre-anesthesia Checklist: Patient identified, Emergency Drugs available, Suction available and Patient being monitored Patient Re-evaluated:Patient Re-evaluated prior to induction Oxygen Delivery Method: Circle system utilized Preoxygenation: Pre-oxygenation with 100% oxygen Induction Type: IV induction Ventilation: Mask ventilation without difficulty LMA: LMA inserted LMA Size: 5.0 Number of attempts: 1 Airway Equipment and Method: Bite block Placement Confirmation: positive ETCO2 and breath sounds checked- equal and bilateral Tube secured with: Tape Dental Injury: Teeth and Oropharynx as per pre-operative assessment

## 2018-02-28 NOTE — Discharge Instructions (Signed)
Percutaneous Nephrostomy  Percutaneous nephrostomy is a procedure to insert a flexible tube into your kidney so that urine can leave your body. This procedure may be done if a medical condition prevents urine from leaving your kidney in the usual way. Urine is normally carried from the kidneys to the bladder through narrow tubes called ureters. A ureter can become blocked because of conditions such as kidney stones, tumors, infection, or blood clots. The nephrostomy tube will be inserted through your back. After the procedure, the tube will remain in place, and urine will drain from the kidney into a drainage bag outside your body. Draining the urine will relieve pressure and help prevent infection that could damage the kidney. Often, this procedure allows your health care provider to identify the cause of the blockage and plan appropriate treatment. Tell a health care provider about:  Any allergies you have.  All medicines you are taking, including vitamins, herbs, eye drops, creams, and over-the-counter medicines.  Any problems you or family members have had with anesthetic medicines.  Any blood disorders you have.  Any surgeries you have had.  Any medical conditions you have.  Whether you are pregnant or may be pregnant. What are the risks? Generally, this is a safe procedure. However, problems may occur, including:  Infection.  Bleeding.  Allergic reactions to medicines or dyes used in the procedure.  Damage to other structures or organs. What happens before the procedure?  Follow instructions from your health care provider about eating or drinking restrictions.  Ask your health care provider about: ? Changing or stopping your regular medicines. This is especially important if you are taking diabetes medicines or blood thinners. ? Taking medicines such as aspirin and ibuprofen. These medicines can thin your blood. Do not take these medicines before your procedure if your health  care provider instructs you not to.  You may be given antibiotic medicine to help prevent infection.  You may have blood tests to see how well your kidneys and liver are working and to see how well your blood can clot.  Plan to have someone take you home from the hospital or clinic. What happens during the procedure?  To reduce your risk of infection: ? Your health care team will wash or sanitize their hands. ? Your skin will be washed with soap.  An IV tube will be inserted into one of your veins. You may be given medicines through this IV tube to help prevent nausea and pain.  You will be positioned on your abdomen.  You will be given one or more of the following: ? A medicine to help you relax (sedative). ? A medicine to numb the area (local anesthetic) where the nephrostomy tube will be inserted.  The nephrostomy tube, which is thin and flexible, will be inserted into a needle.  The needle will be inserted into your body and guided to your kidney. An imaging method that uses X-ray images (fluoroscopy) will be used to help guide the needle to the kidney.  A dye will be injected through the nephrostomy tube. Then, X-ray images that highlight your kidney will be taken.  Next, the needle will be removed, but the nephrostomy tube will be left in your kidney. The tube may be secured to your skin with stitches (sutures).  A drainage bag will be attached to the nephrostomy tube. Urine will be able to drain from your kidney to this drainage bag outside your body. The procedure may vary among health care providers  and hospitals. What happens after the procedure?  Your blood pressure, heart rate, breathing rate, and blood oxygen level will be monitored until the medicines you were given have worn off.  You will need to remain lying down for several hours.  You will be taught how to care for the nephrostomy tube and the drainage bag.  Donot drive for 24 hours if you were given a  sedative. This information is not intended to replace advice given to you by your health care provider. Make sure you discuss any questions you have with your health care provider. Document Released: 10/19/2012 Document Revised: 10/11/2015 Document Reviewed: 10/11/2015 Elsevier Interactive Patient Education  2019 [employer].  Cystoscopy patient instructions   Following a cystoscopy, a catheter (a flexible rubber tube) is sometimes left in place to empty the bladder. This may cause some discomfort or a feeling that you need to urinate. Your doctor determines the period of time that the catheter will be left in place. You may have bloody urine for two to three days (Call your doctor if the amount of bleeding increases or does not subside).  You may pass blood clots in your urine, especially if you had a biopsy. It is not unusual to pass small blood clots and have some bloody urine a couple of weeks after your cystoscopy. Again, call your doctor if the bleeding does not subside. You may have: Dysuria (painful urination) Frequency (urinating often) Urgency (strong desire to urinate)  These symptoms are common especially if medicine is instilled into the bladder or a ureteral stent is placed. Avoiding alcohol and caffeine, such as coffee, tea, and chocolate, may help relieve these symptoms. Drink plenty of water, unless otherwise instructed. Your doctor may also prescribe an antibiotic or other medicine to reduce these symptoms.  Cystoscopy results are available soon after the procedure; biopsy results usually take two to four days. Your doctor will discuss the results of your exam with you. Before you go home, you will be given specific instructions for follow-up care. Special Instructions:  1 If you are going home with a catheter in place do not take a tub bath until removed by your doctor.  2 You may resume your normal activities.  3 Do not drive or operate machinery if you are taking  narcotic pain medicine.  4 Be sure to keep all follow-up appointments with your doctor.   5 Call Your Doctor If: The catheter is not draining  You have severe pain  You are unable to urinate  You have a fever over 101  You have severe bleeding     Post Anesthesia Home Care Instructions  Activity: Get plenty of rest for the remainder of the day. A responsible individual must stay with you for 24 hours following the procedure.  For the next 24 hours, DO NOT: -Drive a car -Paediatric nurse -Drink alcoholic beverages -Take any medication unless instructed by your physician -Make any legal decisions or sign important papers.  Meals: Start with liquid foods such as gelatin or soup. Progress to regular foods as tolerated. Avoid greasy, spicy, heavy foods. If nausea and/or vomiting occur, drink only clear liquids until the nausea and/or vomiting subsides. Call your physician if vomiting continues.  Special Instructions/Symptoms: Your throat may feel dry or sore from the anesthesia or the breathing tube placed in your throat during surgery. If this causes discomfort, gargle with warm salt water. The discomfort should disappear within 24 hours.  If you had a scopolamine patch placed  behind your ear for the management of post- operative nausea and/or vomiting:  1. The medication in the patch is effective for 72 hours, after which it should be removed.  Wrap patch in a tissue and discard in the trash. Wash hands thoroughly with soap and water. 2. You may remove the patch earlier than 72 hours if you experience unpleasant side effects which may include dry mouth, dizziness or visual disturbances. 3. Avoid touching the patch. Wash your hands with soap and water after contact with the patch.

## 2018-02-28 NOTE — Op Note (Signed)
PATIENT:  David [name]  PRE-OPERATIVE DIAGNOSIS: Right ureteral obstruction  POST-OPERATIVE DIAGNOSIS: Same  PROCEDURE: 1.  Cystoscopy with right retrograde pyelogram including interpretation. 2.  Right ureteroscopy  SURGEON:  Claybon Jabs  INDICATION: David [name] is a 73 year old male who initially underwent ureteroscopy and laser lithotripsy of a right ureteral stone on 09/24/2017.  At the time the stone was completely fragmented however follow-up imaging revealed there was still stone material with in the right ureter.  He did have known proximal renal calculi that could have passed in the interval however despite medical expulsive therapy and time the stone fragments did not pass and therefore he underwent repeat ureteroscopy and laser lithotripsy on 11/29/2017.  At that time I found stone fragments in the right ureter with stricturing of the ureter in the area where the stones had been located previously.  My thought was that this was possibly inflammatory in nature due to the duration the stone had been present in that location and therefore fragmented the stones further and placed a stent.  At the time it appeared fragments of the stone had migrated outside of the ureter however I was able to pass the ureteroscope all the way through this area and up into the kidney and left a stent in place.  The stent was then removed and follow-up ultrasound revealed significant right hydronephrosis and therefore he is brought back to the operating room today to attempt stent placement.  We did discuss the fact that if I am unsuccessful a percutaneous nephrostomy tube would need to be placed with an attempt at getting a stent across the area in an antegrade fashion.  ANESTHESIA:  General  EBL:  Minimal  DRAINS: None  LOCAL MEDICATIONS USED:  None  SPECIMEN: None  Description of procedure: After informed consent the patient was taken to the operating room and placed on the table in a supine  position. General anesthesia was then administered. Once fully anesthetized the patient was moved to the dorsal lithotomy position and the genitalia were sterilely prepped and draped in standard fashion. An official timeout was then performed.  Initially the 23 French cystoscope was passed under direct vision with a 30 degree lens down the urethra which is noted to be normal.  The prostatic urethra was noted to have some elongation and lateral lobe hypertrophy.  Upon entering the bladder full inspection was undertaken and no tumors, stones or inflammatory lesions were identified.  The right and left ureteral orifice appeared normal.  A 5 French open-ended ureteral catheter was then advanced into the right ureter and up the right ureter for a short distance.  I then performed a right retrograde pyelogram by injecting full-strength Omnipaque contrast up the ureter but was unable to see any contrast getting past the previous location of stricturing.  I therefore passed a 0.038 inch soft tipped guidewire through the open-ended catheter and tried to gently negotiate this up the ureter but was unsuccessful.  I therefore left this guidewire in place in the distal ureter and remove the cystoscope and proceeded with ureteroscopy.  A 6 French needle tip rigid ureteroscope was then passed along side the guidewire and up the ureter.  There appeared to be obliteration of the lumen.  There was an area that appeared to be a location of the more proximal ureter and therefore gentle pressure was applied to the guidewire and it passed through this area without difficulty.  I then removed the ureteroscope and passed the 5 [place]  open-ended catheter over this guidewire that appeared to be curling in the area where I would expect the renal pelvis to be located.  I injected contrast once again and noted what appeared to be extravasation without definite outlining of the collecting system.  I did see what appeared to possibly be a  calyx but was unsure.  I therefore removed the open-ended ureteral catheter and passed the ureteroscope over the guidewire.  I did identify what appeared to be fatty tissue indicating the scope was outside of the ureter and therefore I removed the ureteroscope and the guidewire, drained the bladder and the patient was awakened and taken to the recovery room in stable and satisfactory condition.  He tolerated the procedure well with no intraoperative complications.  I checked with interventional radiology today and they had no time available for placement of a nephrostomy tube but it appears I can get him on the schedule in the next 48 hours.  We had discussed previously and my plan is to have a right nephrostomy tube placed followed by an attempt to gain access through the area of obstruction by interventional radiology in an antegrade fashion.  PLAN OF CARE: Discharge to home after PACU  PATIENT DISPOSITION:  PACU - hemodynamically stable.

## 2018-02-28 NOTE — Anesthesia Postprocedure Evaluation (Signed)
Anesthesia Post Note  Patient: ANES RIGEL  Procedure(s) Performed: CYSTOSCOPY WITH URETEROSCOPY Concha Se (Right Renal)     Patient location during evaluation: PACU Anesthesia Type: General Level of consciousness: awake and alert and oriented Pain management: pain level controlled Vital Signs Assessment: post-procedure vital signs reviewed and stable Respiratory status: spontaneous breathing, nonlabored ventilation and respiratory function stable Cardiovascular status: blood pressure returned to baseline and stable Postop Assessment: no apparent nausea or vomiting Anesthetic complications: no    Last Vitals:  Vitals:   02/28/18 0945 02/28/18 0948  BP: 127/70 126/65  Pulse: (!) 59 (!) 58  Resp: 16 (!) 8  Temp:    SpO2: 98% 98%    Last Pain:  Vitals:   02/28/18 1000  TempSrc:   PainSc: 4                  Estanislado Surgeon A.

## 2018-02-28 NOTE — Transfer of Care (Signed)
Immediate Anesthesia Transfer of Care Note  Patient: David [name]  Procedure(s) Performed: Procedure(s) (LRB): CYSTOSCOPY WITH URETEROSCOPY /RETROGRADE (Right)  Patient Location: PACU  Anesthesia Type: General  Level of Consciousness: awake, sedated, patient cooperative and responds to stimulation  Airway & Oxygen Therapy: Patient Spontanous Breathing and Patient connected to [place] oxygen  Post-op Assessment: Report given to PACU RN, Post -op Vital signs reviewed and stable and Patient moving all extremities  Post vital signs: Reviewed and stable  Complications: No apparent anesthesia complications

## 2018-03-01 ENCOUNTER — Encounter (HOSPITAL_BASED_OUTPATIENT_CLINIC_OR_DEPARTMENT_OTHER): Payer: Self-pay | Admitting: Urology

## 2018-03-01 ENCOUNTER — Ambulatory Visit (INDEPENDENT_AMBULATORY_CARE_PROVIDER_SITE_OTHER): Payer: Medicare Other | Admitting: Family Medicine

## 2018-03-01 ENCOUNTER — Other Ambulatory Visit: Payer: Self-pay | Admitting: Radiology

## 2018-03-01 ENCOUNTER — Ambulatory Visit: Payer: Medicare Other | Admitting: Family

## 2018-03-01 VITALS — BP 148/70 | HR 66 | Temp 97.7°F | Resp 18 | Wt 234.4 lb

## 2018-03-01 DIAGNOSIS — R319 Hematuria, unspecified: Secondary | ICD-10-CM | POA: Diagnosis not present

## 2018-03-01 DIAGNOSIS — N200 Calculus of kidney: Secondary | ICD-10-CM

## 2018-03-01 DIAGNOSIS — I10 Essential (primary) hypertension: Secondary | ICD-10-CM

## 2018-03-01 DIAGNOSIS — N281 Cyst of kidney, acquired: Secondary | ICD-10-CM | POA: Diagnosis not present

## 2018-03-01 DIAGNOSIS — R739 Hyperglycemia, unspecified: Secondary | ICD-10-CM

## 2018-03-01 DIAGNOSIS — N289 Disorder of kidney and ureter, unspecified: Secondary | ICD-10-CM

## 2018-03-01 DIAGNOSIS — N1330 Unspecified hydronephrosis: Secondary | ICD-10-CM

## 2018-03-01 LAB — CBC WITH DIFFERENTIAL/PLATELET
Basophils Absolute: 0 10*3/uL (ref 0.0–0.1)
Basophils Relative: 0.2 % (ref 0.0–3.0)
Eosinophils Absolute: 0.1 10*3/uL (ref 0.0–0.7)
Eosinophils Relative: 0.5 % (ref 0.0–5.0)
HCT: 42.5 % (ref 39.0–52.0)
Hemoglobin: 14 g/dL (ref 13.0–17.0)
Lymphocytes Relative: 16 % (ref 12.0–46.0)
Lymphs Abs: 1.9 10*3/uL (ref 0.7–4.0)
MCHC: 32.9 g/dL (ref 30.0–36.0)
MCV: 91 fl (ref 78.0–100.0)
Monocytes Absolute: 1 10*3/uL (ref 0.1–1.0)
Monocytes Relative: 8.3 % (ref 3.0–12.0)
Neutro Abs: 9.1 10*3/uL — ABNORMAL HIGH (ref 1.4–7.7)
Neutrophils Relative %: 75 % (ref 43.0–77.0)
Platelets: 261 10*3/uL (ref 150.0–400.0)
RBC: 4.67 Mil/uL (ref 4.22–5.81)
RDW: 14.5 % (ref 11.5–15.5)
WBC: 12.1 10*3/uL — AB (ref 4.0–10.5)

## 2018-03-01 LAB — URINALYSIS, ROUTINE W REFLEX MICROSCOPIC
Bilirubin Urine: NEGATIVE
Ketones, ur: NEGATIVE
Leukocytes,Ua: NEGATIVE
Nitrite: NEGATIVE
Specific Gravity, Urine: <=1.005 — AB (ref 1.000–1.030)
Total Protein, Urine: NEGATIVE
Urine Glucose: NEGATIVE
Urobilinogen, UA: 0.2 (ref 0.0–1.0)
pH: 5.5 (ref 5.0–8.0)

## 2018-03-01 LAB — COMPREHENSIVE METABOLIC PANEL
ALT: 16 U/L (ref 0–53)
AST: 17 U/L (ref 0–37)
Albumin: 4.4 g/dL (ref 3.5–5.2)
Alkaline Phosphatase: 75 U/L (ref 39–117)
BUN: 22 mg/dL (ref 6–23)
CO2: 28 mEq/L (ref 19–32)
Calcium: 10.6 mg/dL — ABNORMAL HIGH (ref 8.4–10.5)
Chloride: 102 mEq/L (ref 96–112)
Creatinine, Ser: 1.93 mg/dL — ABNORMAL HIGH (ref 0.40–1.50)
GFR: 34.32 mL/min — ABNORMAL LOW (ref >60.00)
Glucose, Bld: 86 mg/dL (ref 70–99)
Potassium: 4.1 mEq/L (ref 3.5–5.1)
Sodium: 138 mEq/L (ref 135–145)
TOTAL PROTEIN: 7.1 g/dL (ref 6.0–8.3)
Total Bilirubin: 0.5 mg/dL (ref 0.2–1.2)

## 2018-03-01 NOTE — Progress Notes (Signed)
Subjective:    Patient ID: David [name], male    DOB: 23-Mar-1945, 73 y.o.   MRN: [id number]  No chief complaint on file.   HPI Patient is in today for follow-up on hydronephrosis.  After his recent imaging and his persistent kidney stones he is very anxious about his renal function.  He is scheduled for a procedure tomorrow to have a tube placed in his right kidney and is going to proceed with that but then is wondering where he will proceed with his ongoing care after that.  He is interested in a nephrology referral given an increase in his creatinine on last check.  No fevers or chills. yesterday took hydrocodone and Tylenol with some relief for the burning that he feels when he urinates and has not taken any today and has only minor burning.  No fevers or chills that are obvious. Denies CP/palp/SOB/HA/congestion/fevers/GI c/o. Taking meds as prescribed  Past Medical History:  Diagnosis Date  . Abnormal liver function 03/18/2013  . Adenoma of right adrenal gland 07/11/2002   2.4cm , noted on CT ABD  . Anxiety   . Arthritis of both knees 03/26/2016  . Astigmatism   . Atrial fibrillation ([place])    x1 successful cardioversions  . Back pain   . BCC (basal cell carcinoma of skin)    under right eye and right ear  . Cataract 09/29/2016  . Depression   . Diverticulitis   . Diverticulosis   . Fatty liver   . GERD (gastroesophageal reflux disease)   . Gout   . Hearing loss of both ears 03/26/2016  . History of chronic prostatitis    started at age 42  . History of kidney stones   . Hyperglycemia 03/29/2016  . Hyperlipidemia   . Hypertension   . IBS (irritable bowel syndrome) 03/18/2013  . Incomplete right bundle branch block (RBBB)   . Internal hemorrhoids   . Kidney lesion 06/07/2015   Right midportion, 1.1x1.1 cm hyperechoic, noted on [place] ABD  . LAFB (left anterior fascicular block)   . Lipoma of axilla 09/2016  . Liver lesion, right lobe 06/07/2015   2.5x2.4x2.4 cm hypoechoic  lesion posterior aspect, noted on [place] ABD  . Low back pain 03/26/2016  . LVH (left ventricular hypertrophy) 08/06/2017   Moderate, noted on ECHO  . Medicare annual wellness visit, subsequent 03/12/2014  . Muscle cramps   . Nocturia   . OA (osteoarthritis)    Back, Hands, Neck  . Obesity 11/25/2007   Qualifier: Diagnosis of  By: Lenna Gilford MD, Deborra Medina   . Other malaise and fatigue 03/13/2013  . Penis symptom or sign    PENIS RED NO DRAINAGE   . Premature ventricular complex   . Preventative health care 03/07/2015  . Renal insufficiency   . Scoliosis    Upper thoracic and lumbar  . Vitamin D deficiency    resolved    Past Surgical History:  Procedure Laterality Date  . CARDIOVERSION  07/31/2017  . CATARACT EXTRACTION, BILATERAL    . COLON SURGERY  2009   segmental sigmoid resection  . COLONOSCOPY    . CYSTOSCOPY WITH URETEROSCOPY AND STENT PLACEMENT Right 02/28/2018   Procedure: CYSTOSCOPY WITH URETEROSCOPY Concha Se;  Surgeon: Kathie Rhodes, MD;  Location: [facility];  Service: Urology;  Laterality: Right;  . CYSTOSCOPY/URETEROSCOPY/HOLMIUM LASER/STENT PLACEMENT Right 11/29/2017   Procedure: CYSTOSCOPY/RETROGRADE/URETEROSCOPY/HOLMIUM LASER/STENT PLACEMENT;  Surgeon: Kathie Rhodes, MD;  Location: [facility];  Service: Urology;  Laterality: Right;  .  EYE SURGERY Bilateral 01/12/2017   cataract removal  . HOLMIUM LASER APPLICATION Right 0/93/2671   Procedure: HOLMIUM LASER APPLICATION;  Surgeon: Kathie Rhodes, MD;  Location: [facility];  Service: Urology;  Laterality: Right;  . NOSE SURGERY     Submucous resection age 53  . NUCLEAR STRESS TEST  06/03/2009  . SKIN BIOPSY    . URETEROSCOPY WITH HOLMIUM LASER LITHOTRIPSY Right 09/24/2017   Procedure: CYSTOSCOPY, URETEROSCOPY WITH HOLMIUM LASER LITHOTRIPSY, STENT PLACEMENT;  Surgeon: Kathie Rhodes, MD;  Location[facility];  Service: Urology;  Laterality: Right;    Family History  Problem  Relation Age of Onset  . Heart disease Mother   . Hypertension Mother   . Stroke Mother   . Colon cancer Mother   . Breast cancer Mother   . Heart disease Father        pacemaker  . Aortic aneurysm Father   . Hypertension Father   . Heart disease Sister   . Atrial fibrillation Sister   . Obesity Sister   . Sleep apnea Sister   . Heart attack Brother   . Other Brother        muscle disease  . Arthritis Brother   . Stroke Brother   . Atrial fibrillation Brother   . Cancer Maternal Grandmother        ?  Marland Kitchen Heart attack Maternal Grandmother   . Diabetes Maternal Grandmother   . Cancer Maternal Grandfather        hodgin's lymphoma  . Heart attack Paternal Grandmother   . Anxiety disorder Paternal Grandmother   . Pneumonia Paternal Grandfather   . Heart attack Brother   . Atrial fibrillation Brother   . Atrial fibrillation Brother   . Heart attack Brother   . Hypertension Brother   . Hyperlipidemia Brother   . Heart attack Brother   . Other Brother        heart valve operation  . Atrial fibrillation Brother     Social History   Socioeconomic History  . Marital status: Married    Spouse name: Not on file  . Number of children: 3  . Years of education: Not on file  . Highest education level: Not on file  Occupational History  . Occupation: Nurse, adult: [employer]  . Financial resource strain: Not on file  . Food insecurity:    Worry: Not on file    Inability: Not on file  . Transportation needs:    Medical: Not on file    Non-medical: Not on file  Tobacco Use  . Smoking status: Former Smoker    Packs/day: 1.00    Years: 6.00    Pack years: 6.00    Types: Cigarettes    Start date: 01/12/1969  . Smokeless tobacco: Never Used  . Tobacco comment: [id number]  Substance and Sexual Activity  . Alcohol use: Not Currently    Comment: NONE SINCE July 18 2017  . Drug use: No  . Sexual activity: Yes  Lifestyle  . Physical activity:    Days per week: Not  on file    Minutes per session: Not on file  . Stress: Not on file  Relationships  . Social connections:    Talks on phone: Not on file    Gets together: Not on file    Attends religious service: Not on file    Active member of club or organization: Not on file  Attends meetings of clubs or organizations: Not on file    Relationship status: Not on file  . Intimate partner violence:    Fear of current or ex partner: Not on file    Emotionally abused: Not on file    Physically abused: Not on file    Forced sexual activity: Not on file  Other Topics Concern  . Not on file  Social History Narrative   The patient is married for the second time, he has 3 sons.   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   2 alcoholic beverages most days.   1 caffeinated beverage daily   No drug use no current tobacco use he is a prior smoker   12/24/2016    Outpatient Medications Prior to Visit  Medication Sig Dispense Refill  . acetaminophen (TYLENOL) 500 MG tablet Take 1,000 mg by mouth every 6 (six) hours as needed.    Marland Kitchen alum hydroxide-mag trisilicate (GAVISCON) 46-50 MG CHEW Chew 1 tablet by mouth daily as needed for indigestion or heartburn.     Marland Kitchen atenolol (TENORMIN) 25 MG tablet TAKE ONE TABLET BY MOUTH TWICE A DAY (Patient taking differently: Take 25 mg by mouth 2 (two) times daily. ) 180 tablet 2  . bisacodyl (DULCOLAX) 5 MG EC tablet Take 5 mg by mouth daily as needed for moderate constipation.    . carboxymethylcellulose (REFRESH PLUS) 0.5 % SOLN Place 1 drop into both eyes daily.    . Colchicine 0.6 MG CAPS 1 tab po bid (Patient taking differently: Take 0.6 mg by mouth daily as needed (gout). 1 tab po bid) 60 capsule 0  . cyclobenzaprine (FLEXERIL) 5 MG tablet Take 1 tablet (5 mg total) by mouth 3 (three) times daily as needed for muscle spasms. (Patient not taking: Reported on 02/24/2018) 30 tablet 1  . erythromycin ophthalmic ointment Place 1 application into both eyes 2 (two) times daily.     . fluticasone (FLONASE) 50 MCG/ACT nasal spray Place 2 sprays into the nose as needed. For congestion    . HYDROcodone-acetaminophen (NORCO) 7.5-325 MG tablet Take 1-2 tablets by mouth every 4 (four) hours as needed for moderate pain. Maximum dose per 24 hours - 8 pills 20 tablet 0  . LORazepam (ATIVAN) 1 MG tablet Take 0.5 tablets (0.5 mg total) by mouth 2 (two) times daily as needed. 90 tablet 1  . Menthol, Topical Analgesic, (ICY HOT BACK EX) Apply 1 application topically daily as needed (back pain).    Marland Kitchen omeprazole (PRILOSEC) 20 MG capsule Take 20 mg by mouth as needed.    . phenazopyridine (PYRIDIUM) 200 MG tablet Take 1 tablet (200 mg total) by mouth 3 (three) times daily as needed for pain. (Patient not taking: Reported on 02/24/2018) 20 tablet 0  . Probiotic Product (PROBIOTIC DAILY PO) Take 1 capsule by mouth daily.     . sertraline (ZOLOFT) 50 MG tablet Take 1 tablet (50 mg total) by mouth daily. (Patient not taking: Reported on 02/24/2018) 30 tablet 3  . tamsulosin (FLOMAX) 0.4 MG CAPS capsule Take 0.4 mg by mouth daily after supper.     No facility-administered medications prior to visit.     Allergies  Allergen Reactions  . Cefaclor Hives  . Cephalosporins Hives  . Penicillins Rash    Mild maculopapular rash Has patient had a PCN reaction causing immediate rash, facial/tongue/throat swelling, SOB or lightheadedness with hypotension: No Has patient had a PCN reaction causing severe rash involving mucus membranes or skin necrosis:  No Has patient had a PCN reaction that required hospitalization: No Has patient had a PCN reaction occurring within the last 10 years: No If all of the above answers are "NO", then may proceed with Cephalosporin use.     Review of Systems  Constitutional: Negative for fever and malaise/fatigue.  HENT: Negative for congestion.   Eyes: Negative for blurred vision.  Respiratory: Negative for shortness of breath.   Cardiovascular: Negative for chest  pain, palpitations and leg swelling.  Gastrointestinal: Negative for abdominal pain, blood in stool and nausea.  Genitourinary: Positive for flank pain. Negative for dysuria and frequency.  Musculoskeletal: Negative for falls.  Skin: Negative for rash.  Neurological: Negative for dizziness, loss of consciousness and headaches.  Endo/Heme/Allergies: Negative for environmental allergies.  Psychiatric/Behavioral: Negative for depression. The patient is nervous/anxious.        Objective:    Physical Exam Vitals signs and nursing note reviewed.  Constitutional:      General: He is not in acute distress.    Appearance: He is well-developed.  HENT:     Head: Normocephalic and atraumatic.     Nose: Nose normal.  Eyes:     General:        Right eye: No discharge.        Left eye: No discharge.  Neck:     Musculoskeletal: Normal range of motion and neck supple.  Cardiovascular:     Rate and Rhythm: Normal rate and regular rhythm.     Heart sounds: No murmur.  Pulmonary:     Effort: Pulmonary effort is normal.     Breath sounds: Normal breath sounds.  Abdominal:     General: Bowel sounds are normal.     Palpations: Abdomen is soft.     Tenderness: There is no abdominal tenderness.  Skin:    General: Skin is warm and dry.  Neurological:     Mental Status: He is alert and oriented to person, place, and time.     BP (!) 148/70 (BP Location: Left Arm, Patient Position: Sitting, Cuff Size: Normal)   Pulse 66   Temp 97.7 F (36.5 C) (Oral)   Resp 18   Wt 234 lb 6.4 oz (106.3 kg)   SpO2 96%   BMI 31.79 kg/m  Wt Readings from Last 3 Encounters:  03/01/18 234 lb 6.4 oz (106.3 kg)  02/28/18 231 lb 11.2 oz (105.1 kg)  02/18/18 235 lb 6.4 oz (106.8 kg)     Lab Results  Component Value Date   WBC 6.4 02/18/2018   HGB 13.6 02/18/2018   HCT 41.0 02/18/2018   PLT 190.0 02/18/2018   GLUCOSE 89 02/18/2018   CHOL 202 (H) 10/08/2017   TRIG 94.0 10/08/2017   HDL 33.00 (L)  10/08/2017   LDLDIRECT 168.5 03/29/2006   LDLCALC 151 (H) 10/08/2017   ALT 15 02/18/2018   AST 15 02/18/2018   NA 140 02/18/2018   K 4.2 02/18/2018   CL 105 02/18/2018   CREATININE 2.00 (H) 02/18/2018   BUN 31 (H) 02/18/2018   CO2 26 02/18/2018   TSH 3.05 10/08/2017   PSA 3.36 11/15/2017   INR 1.39 11/14/2010   HGBA1C 5.7 10/08/2017    Lab Results  Component Value Date   TSH 3.05 10/08/2017   Lab Results  Component Value Date   WBC 6.4 02/18/2018   HGB 13.6 02/18/2018   HCT 41.0 02/18/2018   MCV 91.4 02/18/2018   PLT 190.0 02/18/2018   Lab Results  Component Value Date   NA 140 02/18/2018   K 4.2 02/18/2018   CO2 26 02/18/2018   GLUCOSE 89 02/18/2018   BUN 31 (H) 02/18/2018   CREATININE 2.00 (H) 02/18/2018   BILITOT 0.5 02/18/2018   ALKPHOS 76 02/18/2018   AST 15 02/18/2018   ALT 15 02/18/2018   PROT 6.6 02/18/2018   ALBUMIN 4.2 02/18/2018   CALCIUM 10.1 02/18/2018   ANIONGAP 8 10/03/2017   GFR 32.94 (L) 02/18/2018   Lab Results  Component Value Date   CHOL 202 (H) 10/08/2017   Lab Results  Component Value Date   HDL 33.00 (L) 10/08/2017   Lab Results  Component Value Date   LDLCALC 151 (H) 10/08/2017   Lab Results  Component Value Date   TRIG 94.0 10/08/2017   Lab Results  Component Value Date   CHOLHDL 6 10/08/2017   Lab Results  Component Value Date   HGBA1C 5.7 10/08/2017       Assessment & Plan:   Problem List Items Addressed This Visit    Essential hypertension, benign    Mildly elevated but patient very anxious about his procedure tomorrow. No changes today      Renal insufficiency - Primary    Recheck cmp and referred to nephrology for ongoing surveillance. Encouraged to continue hydrating well      Relevant Orders   Ambulatory referral to Urology   Comprehensive metabolic panel   Ambulatory referral to Nephrology   Complex renal cyst   Relevant Orders   Ambulatory referral to Urology   Hyperglycemia    hgba1c  acceptable, minimize simple carbs. Increase exercise as tolerated.      Kidney stone   Relevant Orders   Ambulatory referral to Urology   CBC w/Diff   Urinalysis   Urine Culture   Hydronephrosis    From kidney stones on the right. Is scheduled for a procedure tomorrow to place a tube in the kidney and diminish the pressure on the system. He is also interested in a second opinion regarding further management of his kidney stones so referral is placed.        Other Visit Diagnoses    Hematuria, unspecified type          I am having David [name] "Fred" maintain his fluticasone, alum hydroxide-mag trisilicate, cyclobenzaprine, atenolol, Colchicine, tamsulosin, carboxymethylcellulose, (Menthol, Topical Analgesic, (ICY HOT BACK EX)), Probiotic Product (PROBIOTIC DAILY PO), phenazopyridine, omeprazole, sertraline, LORazepam, acetaminophen, erythromycin, bisacodyl, and HYDROcodone-acetaminophen.  No orders of the defined types were placed in this encounter.    Penni Homans, MD

## 2018-03-01 NOTE — Assessment & Plan Note (Signed)
From kidney stones on the right. Is scheduled for a procedure tomorrow to place a tube in the kidney and diminish the pressure on the system. He is also interested in a second opinion regarding further management of his kidney stones so referral is placed.

## 2018-03-01 NOTE — Assessment & Plan Note (Signed)
Recheck cmp and referred to nephrology for ongoing surveillance. Encouraged to continue hydrating well

## 2018-03-01 NOTE — Assessment & Plan Note (Signed)
hgba1c acceptable, minimize simple carbs. Increase exercise as tolerated.  

## 2018-03-01 NOTE — Patient Instructions (Signed)
Hydronephrosis  Hydronephrosis is the swelling of one or both kidneys due to a blockage that stops urine from flowing out of the body. Kidneys filter waste from the blood and produce urine. This condition can lead to kidney failure and may become life threatening if not treated promptly. What are the causes? Common causes of this condition include:  Problems that occur when a baby is developing in the womb (congenital defect). These can include problems: ? In the kidneys. ? In the tubes that drain urine from the kidneys into the bladder (ureters).  Kidney stones.  Bladder infection.  An enlarged prostate gland.  Scar tissue from a previous surgery or injury.  A blood clot.  A tumor or cyst in the abdomen or pelvis.  Cancer of the prostate, bladder, uterus, ovary, or colon. What are the signs or symptoms? Symptoms of this condition include:  Pain or discomfort in your side (flank).  Pain and swelling in your abdomen.  Nausea and vomiting.  Fever.  Pain when passing urine.  Feelings of urgency when you need to urinate.  Urinating more often than normal. In some cases, you may not have any symptoms. How is this diagnosed? This condition may be diagnosed based on:  Your symptoms and medical history.  A physical exam.  Blood and urine tests.  Imaging tests, such as an ultrasound, CT scan, or MRI.  A procedure in which a scope is inserted into the urethra and used to view parts of the urinary tract and bladder (cystoscopy). How is this treated? Treatment for this condition depends on where the blockage is, how long it has been there, and what caused it. The goal of treatment is to remove the blockage. Treatment may include:  Antibiotic medicines to treat or prevent infection.  A procedure to place a small, thin tube (stent) into a blocked ureter. The stent will keep the ureter open so that urine can drain through it.  A nonsurgical procedure that crushes kidney  stones with shock waves (extracorporeal shock wave lithotripsy).  If kidney failure occurs, treatment may include dialysis or a kidney transplant. Follow these instructions at home:   Take over-the-counter and prescription medicines only as told by your health care provider.  Rest and return to your normal activities as told by your health care provider. Ask your health care provider what activities are safe for you.  Drink enough fluid to keep your urine pale yellow.  If you were prescribed an antibiotic medicine, take it exactly as told by your health care provider. Do not stop taking the antibiotic even if you start to feel better.  Keep all follow-up visits as told by your health care provider. This is important. Contact a health care provider if:  You continue to have symptoms after treatment.  You develop new symptoms.  Your urine becomes cloudy or bloody.  You have a fever. Get help right away if:  You have severe flank or abdominal pain.  You cannot drink fluids without vomiting. Summary  Hydronephrosis is the swelling of one or both kidneys due to a blockage that stops urine from flowing out of the body.  Hydronephrosis can lead to kidney failure and may become life threatening if not treated promptly.  The goal of treatment is to treat the cause of the blockage. It may include insertion of stent into a blocked ureter, a procedure to treat kidney stones, and antibiotic medicines.  Follow your health care provider's instructions for taking care of yourself at   failure and may become life threatening if not treated promptly.  · The goal of treatment is to treat the cause of the blockage. It may include insertion of stent into a blocked ureter, a procedure to treat kidney stones, and antibiotic medicines.  · Follow your health care provider's instructions for taking care of yourself at home, including instructions about drinking fluids, taking medicines, and limiting activities.  This information is not intended to replace advice given to you by your health care provider. Make sure you discuss any questions you have with your health care provider.  Document Released: 10/26/2006 Document Revised: 01/09/2017 Document Reviewed: 01/09/2017  Elsevier Interactive Patient Education © 2019 [employer]  [employer]

## 2018-03-01 NOTE — Assessment & Plan Note (Signed)
Mildly elevated but patient very anxious about his procedure tomorrow. No changes today

## 2018-03-02 ENCOUNTER — Other Ambulatory Visit (HOSPITAL_COMMUNITY): Payer: Self-pay | Admitting: Diagnostic Radiology

## 2018-03-02 ENCOUNTER — Encounter (HOSPITAL_COMMUNITY): Payer: Self-pay

## 2018-03-02 ENCOUNTER — Other Ambulatory Visit (HOSPITAL_COMMUNITY): Payer: Self-pay | Admitting: Urology

## 2018-03-02 ENCOUNTER — Ambulatory Visit (HOSPITAL_COMMUNITY)
Admission: RE | Admit: 2018-03-02 | Discharge: 2018-03-02 | Disposition: A | Discharge status: Home / Self Care | Payer: Medicare Other | Source: Ambulatory Visit | Attending: Urology | Admitting: Urology

## 2018-03-02 ENCOUNTER — Ambulatory Visit (HOSPITAL_COMMUNITY)
Admission: RE | Admit: 2018-03-02 | Discharge: 2018-03-02 | Disposition: A | Discharge status: Home / Self Care | Payer: Medicare Other | Source: Ambulatory Visit

## 2018-03-02 DIAGNOSIS — K219 Gastro-esophageal reflux disease without esophagitis: Secondary | ICD-10-CM | POA: Insufficient documentation

## 2018-03-02 DIAGNOSIS — F329 Major depressive disorder, single episode, unspecified: Secondary | ICD-10-CM | POA: Insufficient documentation

## 2018-03-02 DIAGNOSIS — M1990 Unspecified osteoarthritis, unspecified site: Secondary | ICD-10-CM | POA: Insufficient documentation

## 2018-03-02 DIAGNOSIS — F419 Anxiety disorder, unspecified: Secondary | ICD-10-CM | POA: Diagnosis not present

## 2018-03-02 DIAGNOSIS — N1339 Other hydronephrosis: Secondary | ICD-10-CM

## 2018-03-02 DIAGNOSIS — E785 Hyperlipidemia, unspecified: Secondary | ICD-10-CM | POA: Insufficient documentation

## 2018-03-02 DIAGNOSIS — N131 Hydronephrosis with ureteral stricture, not elsewhere classified: Secondary | ICD-10-CM | POA: Diagnosis present

## 2018-03-02 DIAGNOSIS — M109 Gout, unspecified: Secondary | ICD-10-CM | POA: Diagnosis not present

## 2018-03-02 DIAGNOSIS — I10 Essential (primary) hypertension: Secondary | ICD-10-CM | POA: Insufficient documentation

## 2018-03-02 HISTORY — PX: IR NEPHROSTOMY PLACEMENT RIGHT: IMG6064

## 2018-03-02 LAB — URINE CULTURE
MICRO NUMBER: 209560
Result:: NO GROWTH
SPECIMEN QUALITY:: ADEQUATE

## 2018-03-02 LAB — BASIC METABOLIC PANEL
ANION GAP: 8 (ref 5–15)
BUN: 29 mg/dL — ABNORMAL HIGH (ref 8–23)
CO2: 25 mmol/L (ref 22–32)
Calcium: 10.1 mg/dL (ref 8.9–10.3)
Chloride: 106 mmol/L (ref 98–111)
Creatinine, Ser: 2.2 mg/dL — ABNORMAL HIGH (ref 0.61–1.24)
GFR calc Af Amer: 33 mL/min — ABNORMAL LOW (ref >60)
GFR calc non Af Amer: 29 mL/min — ABNORMAL LOW (ref >60)
Glucose, Bld: 92 mg/dL (ref 70–99)
POTASSIUM: 4.1 mmol/L (ref 3.5–5.1)
Sodium: 139 mmol/L (ref 135–145)

## 2018-03-02 LAB — CBC
HCT: 43.1 % (ref 39.0–52.0)
HEMOGLOBIN: 13.9 g/dL (ref 13.0–17.0)
MCH: 30.1 pg (ref 26.0–34.0)
MCHC: 32.3 g/dL (ref 30.0–36.0)
MCV: 93.3 fL (ref 80.0–100.0)
Platelets: 226 10*3/uL (ref 150–400)
RBC: 4.62 MIL/uL (ref 4.22–5.81)
RDW: 13.3 % (ref 11.5–15.5)
WBC: 9.4 10*3/uL (ref 4.0–10.5)
nRBC: 0 % (ref 0.0–0.2)

## 2018-03-02 LAB — PROTIME-INR
INR: 0.99
Prothrombin Time: 13 seconds (ref 11.4–15.2)

## 2018-03-02 MED ORDER — FENTANYL CITRATE (PF) 100 MCG/2ML IJ SOLN
INTRAMUSCULAR | Status: AC
  Filled 2018-03-02: qty 2

## 2018-03-02 MED ORDER — SODIUM CHLORIDE 0.9 % IV SOLN
INTRAVENOUS | Status: DC
  Administered 2018-03-02: 14:00:00 via INTRAVENOUS

## 2018-03-02 MED ORDER — HYDROCODONE-ACETAMINOPHEN 5-325 MG PO TABS
1.0000 | ORAL_TABLET | ORAL | Status: DC | PRN
  Administered 2018-03-02: 2 via ORAL
  Filled 2018-03-02: qty 2

## 2018-03-02 MED ORDER — CIPROFLOXACIN IN D5W 400 MG/200ML IV SOLN
INTRAVENOUS | Status: AC
  Administered 2018-03-02: 400 mg via INTRAVENOUS
  Filled 2018-03-02: qty 200

## 2018-03-02 MED ORDER — FENTANYL CITRATE (PF) 100 MCG/2ML IJ SOLN
INTRAMUSCULAR | Status: AC | PRN
  Administered 2018-03-02 (×2): 50 ug via INTRAVENOUS

## 2018-03-02 MED ORDER — MIDAZOLAM HCL 2 MG/2ML IJ SOLN
INTRAMUSCULAR | Status: AC | PRN
  Administered 2018-03-02 (×3): 1 mg via INTRAVENOUS

## 2018-03-02 MED ORDER — IOPAMIDOL (ISOVUE-300) INJECTION 61%
50.0000 mL | Freq: Once | INTRAVENOUS | Status: AC | PRN
  Administered 2018-03-02: 20 mL

## 2018-03-02 MED ORDER — MIDAZOLAM HCL 2 MG/2ML IJ SOLN
INTRAMUSCULAR | Status: AC
  Filled 2018-03-02: qty 4

## 2018-03-02 MED ORDER — LIDOCAINE HCL (PF) 1 % IJ SOLN
INTRAMUSCULAR | Status: AC | PRN
  Administered 2018-03-02: 10 mL

## 2018-03-02 MED ORDER — IOPAMIDOL (ISOVUE-300) INJECTION 61%
INTRAVENOUS | Status: AC
  Administered 2018-03-02: 20 mL
  Filled 2018-03-02: qty 50

## 2018-03-02 MED ORDER — LIDOCAINE HCL 1 % IJ SOLN
INTRAMUSCULAR | Status: AC
  Filled 2018-03-02: qty 20

## 2018-03-02 MED ORDER — CIPROFLOXACIN IN D5W 400 MG/200ML IV SOLN
400.0000 mg | INTRAVENOUS | Status: AC
  Administered 2018-03-02: 400 mg via INTRAVENOUS

## 2018-03-02 NOTE — Discharge Instructions (Signed)
Percutaneous Nephrostomy Home Guide  Percutaneous nephrostomy is a procedure to insert a flexible tube into your kidney so that urine can leave your body. This procedure may be done if a medical condition prevents urine from leaving your kidney in the usual way. During the procedure, the nephrostomy tube is inserted in the right or left side of your lower back and is connected to an external drainage bag.  After you have a nephrostomy tube placed, urine will collect in the drainage bag outside of your body. You will need to empty and change the drainage bag as needed. You will also need to take steps to care for the area where the nephrostomy tube was inserted (tube insertion site).  How do I care for my nephrostomy tube?   Always keep your tubing, the leg bag, or the bedside drainage bag below the level of your kidney so that your urine drains freely.   Avoid activities that would cause bending or pulling of your tubing. Ask your health care provider what activities are safe for you.   When connecting your nephrostomy tube to a drainage bag, make sure that there are no kinks in the tubing and that your urine is draining freely. You may want to gently wrap an elastic bandage over the tubing. This will help keep the tubing in place and prevent it from kinking. Make sure there is no tension on the tubing so it does not become dislodged.   At night, you may want to connect your nephrostomy tube or the leg bag to a larger bedside drainage bag.  How do I empty the drainage bag?  Empty the leg bag or bedside drainage bag whenever it becomes ? full. Also empty it before you go to sleep. Most drainage bags have a drain at the bottom that allows urine to be emptied. Follow these basic steps:  1. Hold the drainage bag over a toilet or collection container. Use a measuring container if your health care provider told you to measure your urine.  2. Open the drain of the bag and allow the urine to drain out.  3. After all the  urine has drained from the drainage bag, close the drain fully.  4. Flush the urine down the toilet. If a collection container was used, rinse the container.  How do I change the dressing around the nephrostomy tube?  Change your dressing and clean your tube exit site as told by your health care provider. You may need to change the dressing every day for the first 2 weeks after having a nephrostomy tube inserted. After the first 2 weeks, you may be told to change the dressing two times a week.  Supplies needed:   Mild soap and water.   Split gauze pads, 4  4 inches (10 x 10 cm).   Gauze pads, 4  4 inches (10 x 10 cm).   Paper tape.  How to change the dressing:  Because of the location of your nephrostomy tube, you may need help from another person to complete dressing changes. Follow these basic steps:  1. Wash hands with soap and water.  2. Gently remove the tape and dressing from around the nephrostomy tube. Be careful not to pull on the tube while removing the dressing. Avoid using scissors because they may damage the tube.  3. Wash the skin around the tube with mild soap and water, rinse well, and pat the skin dry with a clean cloth.  4. Check the skin   that it is still anchored in the skin. 6. Place two split gauze pads in and around the tube exit site. Do not apply ointments or alcohol to the site. 7. Place a gauze pad on top of the split gauze pad. 8. Coil the tube on top of the gauze. The tubing should rest on the gauze, not on the skin. 9. Place tape around each edge of the gauze pad. 10. Secure the nephrostomy tubing. Make sure that the tube does not kink or become pinched. The tubing should rest on the gauze pad, not on the skin. 11. Dispose of used supplies properly.   Contact a health care provider if:  You have problems with any of the valves or  tubing.  You have persistent pain or soreness in your back.  You have more redness, swelling, or pain around your tube insertion site.  You have more fluid or blood coming from your tube insertion site.  Your tube insertion site feels warm to the touch.  You have pus or a bad smell coming from your tube insertion site.  You have increased urine output or you feel burning when urinating. Get help right away if:  You have pain in your abdomen during the first week.  You have chest pain or have trouble breathing.  You have a new appearance of blood in your urine.  You have a fever or chills.  You have back pain that is not relieved by your medicine.  You have decreased urine output.  Your nephrostomy tube comes out. This information is not intended to replace advice given to you by your health care provider. Make sure you discuss any questions you have with your health care provider. Document Released: 10/19/2012 Document Revised: 10/11/2015 Document Reviewed: 10/11/2015 Elsevier Interactive Patient Education  2019 [place].    Moderate Conscious Sedation, Adult, Care After These instructions provide you with information about caring for yourself after your procedure. Your health care provider may also give you more specific instructions. Your treatment has been planned according to current medical practices, but problems sometimes occur. Call your health care provider if you have any problems or questions after your procedure. What can I expect after the procedure? After your procedure, it is common:  To feel sleepy for several hours.  To feel clumsy and have poor balance for several hours.  To have poor judgment for several hours.  To vomit if you eat too soon. Follow these instructions at home: For at least 24 hours after the procedure:   Do not: ? Participate in activities where you could fall or become injured. ? Drive. ? Use heavy machinery. ? Drink  alcohol. ? Take sleeping pills or medicines that cause drowsiness. ? Make important decisions or sign legal documents. ? Take care of children on your own.  Rest. Eating and drinking  Follow the diet recommended by your health care provider.  If you vomit: ? Drink water, juice, or soup when you can drink without vomiting. ? Make sure you have little or no nausea before eating solid foods. General instructions  Have a responsible adult stay with you until you are awake and alert.  Take over-the-counter and prescription medicines only as told by your health care provider.  If you smoke, do not smoke without supervision.  Keep all follow-up visits as told by your health care provider. This is important. Contact a health care provider if:  You keep feeling nauseous or you keep vomiting.  You feel light-headed.  You develop a rash.  You have a fever. Get help right away if:  You have trouble breathing. This information is not intended to replace advice given to you by your health care provider. Make sure you discuss any questions you have with your health care provider. Document Released: 10/19/2012 Document Revised: 06/03/2015 Document Reviewed: 04/20/2015 Elsevier Interactive Patient Education  2019 [employer].

## 2018-03-02 NOTE — Procedures (Signed)
Interventional Radiology Procedure:   Indications: Right ureter obstruction and right hydronephrosis  Procedure: Right nephrostomy tube placement  Findings: Moderate right hydronephrosis.  10 Fr drain in renal pelvis  Complications: None     EBL: Minimal  Plan: Plan to discharge to home this evening if no signs of infection.   Janeil Schexnayder R. Anselm Pancoast, MD  Pager: [phone]

## 2018-03-02 NOTE — Consult Note (Signed)
Chief Complaint: Patient was seen in consultation today for right percutaneous nephrostomy  Referring Physician(s): Kathie Rhodes  Supervising Physician: Markus Daft  Patient Status: [facility] - Out-pt  History of Present Illness: David [name] is a 73 y.o. male with history of nephrolithiasis, right ureteral stricture and hydronephrosis who underwent failed attempt at right ureteral stent placement on 02/28/2018.  He presents today for right percutaneous nephrostomy.  Past Medical History:  Diagnosis Date  . Abnormal liver function 03/18/2013  . Adenoma of right adrenal gland 07/11/2002   2.4cm , noted on CT ABD  . Anxiety   . Arthritis of both knees 03/26/2016  . Astigmatism   . Atrial fibrillation ([place])    x1 successful cardioversions  . Back pain   . BCC (basal cell carcinoma of skin)    under right eye and right ear  . Cataract 09/29/2016  . Depression   . Diverticulitis   . Diverticulosis   . Fatty liver   . GERD (gastroesophageal reflux disease)   . Gout   . Hearing loss of both ears 03/26/2016  . History of chronic prostatitis    started at age 63  . History of kidney stones   . Hyperglycemia 03/29/2016  . Hyperlipidemia   . Hypertension   . IBS (irritable bowel syndrome) 03/18/2013  . Incomplete right bundle branch block (RBBB)   . Internal hemorrhoids   . Kidney lesion 06/07/2015   Right midportion, 1.1x1.1 cm hyperechoic, noted on [place] ABD  . LAFB (left anterior fascicular block)   . Lipoma of axilla 09/2016  . Liver lesion, right lobe 06/07/2015   2.5x2.4x2.4 cm hypoechoic lesion posterior aspect, noted on [place] ABD  . Low back pain 03/26/2016  . LVH (left ventricular hypertrophy) 08/06/2017   Moderate, noted on ECHO  . Medicare annual wellness visit, subsequent 03/12/2014  . Muscle cramps   . Nocturia   . OA (osteoarthritis)    Back, Hands, Neck  . Obesity 11/25/2007   Qualifier: Diagnosis of  By: Lenna Gilford MD, Deborra Medina   . Other malaise and fatigue 03/13/2013  .  Penis symptom or sign    PENIS RED NO DRAINAGE   . Premature ventricular complex   . Preventative health care 03/07/2015  . Renal insufficiency   . Scoliosis    Upper thoracic and lumbar  . Vitamin D deficiency    resolved    Past Surgical History:  Procedure Laterality Date  . CARDIOVERSION  07/31/2017  . CATARACT EXTRACTION, BILATERAL    . COLON SURGERY  2009   segmental sigmoid resection  . COLONOSCOPY    . CYSTOSCOPY WITH URETEROSCOPY AND STENT PLACEMENT Right 02/28/2018   Procedure: CYSTOSCOPY WITH URETEROSCOPY Concha Se;  Surgeon: Kathie Rhodes, MD;  Location: [facility];  Service: Urology;  Laterality: Right;  . CYSTOSCOPY/URETEROSCOPY/HOLMIUM LASER/STENT PLACEMENT Right 11/29/2017   Procedure: CYSTOSCOPY/RETROGRADE/URETEROSCOPY/HOLMIUM LASER/STENT PLACEMENT;  Surgeon: Kathie Rhodes, MD;  Location: [facility];  Service: Urology;  Laterality: Right;  . EYE SURGERY Bilateral 01/12/2017   cataract removal  . HOLMIUM LASER APPLICATION Right 7/37/1062   Procedure: HOLMIUM LASER APPLICATION;  Surgeon: Kathie Rhodes, MD;  Location: [facility];  Service: Urology;  Laterality: Right;  . NOSE SURGERY     Submucous resection age 89  . NUCLEAR STRESS TEST  06/03/2009  . SKIN BIOPSY    . URETEROSCOPY WITH HOLMIUM LASER LITHOTRIPSY Right 09/24/2017   Procedure: CYSTOSCOPY, URETEROSCOPY WITH HOLMIUM LASER LITHOTRIPSY, STENT PLACEMENT;  Surgeon: Kathie Rhodes, MD;  Location:  [place];  Service: Urology;  Laterality: Right;    Allergies: Cefaclor; Cephalosporins; and Penicillins  Medications: Prior to Admission medications   Medication Sig Start Date End Date Taking? Authorizing Provider  acetaminophen (TYLENOL) 500 MG tablet Take 1,000 mg by mouth every 6 (six) hours as needed.   Yes [provider]  atenolol (TENORMIN) 25 MG tablet TAKE ONE TABLET BY MOUTH TWICE A DAY Patient taking differently: Take 25 mg by mouth 2 (two) times  daily.  09/08/17  Yes Mosie Lukes, MD  bisacodyl (DULCOLAX) 5 MG EC tablet Take 5 mg by mouth daily as needed for moderate constipation.   Yes [provider]  carboxymethylcellulose (REFRESH PLUS) 0.5 % SOLN Place 1 drop into both eyes daily.   Yes [provider]  erythromycin ophthalmic ointment Place 1 application into both eyes 2 (two) times daily.   Yes [provider]  HYDROcodone-acetaminophen (NORCO) 7.5-325 MG tablet Take 1-2 tablets by mouth every 4 (four) hours as needed for moderate pain. Maximum dose per 24 hours - 8 pills 02/28/18  Yes Kathie Rhodes, MD  LORazepam (ATIVAN) 1 MG tablet Take 0.5 tablets (0.5 mg total) by mouth 2 (two) times daily as needed. 12/28/17  Yes Mosie Lukes, MD  Menthol, Topical Analgesic, (ICY HOT BACK EX) Apply 1 application topically daily as needed (back pain).   Yes [provider]  omeprazole (PRILOSEC) 20 MG capsule Take 20 mg by mouth as needed.   Yes [provider]  tamsulosin (FLOMAX) 0.4 MG CAPS capsule Take 0.4 mg by mouth daily after supper.   Yes [provider]  alum hydroxide-mag trisilicate (GAVISCON) 55-73 MG CHEW Chew 1 tablet by mouth daily as needed for indigestion or heartburn.     [provider]  Colchicine 0.6 MG CAPS 1 tab po bid Patient taking differently: Take 0.6 mg by mouth daily as needed (gout). 1 tab po bid 09/07/17   Saguier, Percell Miller, PA-C  cyclobenzaprine (FLEXERIL) 5 MG tablet Take 1 tablet (5 mg total) by mouth 3 (three) times daily as needed for muscle spasms. Patient not taking: Reported on 02/24/2018 07/20/17   Mosie Lukes, MD  fluticasone [facility]) 50 MCG/ACT nasal spray Place 2 sprays into the nose as needed. For congestion    [provider]  phenazopyridine (PYRIDIUM) 200 MG tablet Take 1 tablet (200 mg total) by mouth 3 (three) times daily as needed for pain. Patient not taking: Reported on 02/24/2018 11/29/17   Kathie Rhodes, MD  Probiotic  Product (PROBIOTIC DAILY PO) Take 1 capsule by mouth daily.     [provider]  sertraline (ZOLOFT) 50 MG tablet Take 1 tablet (50 mg total) by mouth daily. Patient not taking: Reported on 02/24/2018 12/28/17   Mosie Lukes, MD     Family History  Problem Relation Age of Onset  . Heart disease Mother   . Hypertension Mother   . Stroke Mother   . Colon cancer Mother   . Breast cancer Mother   . Heart disease Father        pacemaker  . Aortic aneurysm Father   . Hypertension Father   . Heart disease Sister   . Atrial fibrillation Sister   . Obesity Sister   . Sleep apnea Sister   . Heart attack Brother   . Other Brother        muscle disease  . Arthritis Brother   . Stroke Brother   . Atrial fibrillation  Brother   . Cancer Maternal Grandmother        ?  Marland Kitchen Heart attack Maternal Grandmother   . Diabetes Maternal Grandmother   . Cancer Maternal Grandfather        hodgin's lymphoma  . Heart attack Paternal Grandmother   . Anxiety disorder Paternal Grandmother   . Pneumonia Paternal Grandfather   . Heart attack Brother   . Atrial fibrillation Brother   . Atrial fibrillation Brother   . Heart attack Brother   . Hypertension Brother   . Hyperlipidemia Brother   . Heart attack Brother   . Other Brother        heart valve operation  . Atrial fibrillation Brother     Social History   Socioeconomic History  . Marital status: Married    Spouse name: Not on file  . Number of children: 3  . Years of education: Not on file  . Highest education level: Not on file  Occupational History  . Occupation: Nurse, adult: [employer]  . Financial resource strain: Not on file  . Food insecurity:    Worry: Not on file    Inability: Not on file  . Transportation needs:    Medical: Not on file    Non-medical: Not on file  Tobacco Use  . Smoking status: Former Smoker    Packs/day: 1.00    Years: 6.00    Pack years: 6.00    Types: Cigarettes    Start date:  01/12/1969  . Smokeless tobacco: Never Used  . Tobacco comment: [id number]  Substance and Sexual Activity  . Alcohol use: Not Currently    Comment: NONE SINCE July 18 2017  . Drug use: No  . Sexual activity: Yes  Lifestyle  . Physical activity:    Days per week: Not on file    Minutes per session: Not on file  . Stress: Not on file  Relationships  . Social connections:    Talks on phone: Not on file    Gets together: Not on file    Attends religious service: Not on file    Active member of club or organization: Not on file    Attends meetings of clubs or organizations: Not on file    Relationship status: Not on file  Other Topics Concern  . Not on file  Social History Narrative   The patient is married for the second time, he has 3 sons.   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   2 alcoholic beverages most days.   1 caffeinated beverage daily   No drug use no current tobacco use he is a prior smoker   12/24/2016      Review of Systems denies fever, headache, chest pain, dyspnea, cough, nausea, vomiting, hematuria/dysuria.  He does have intermittent abdominal and back pain.  Vital Signs: BP (!) 156/81   Pulse (!) 55   Temp 98.1 F (36.7 C) (Oral)   Resp 16   Wt 233 lb 11 oz (106 kg)   SpO2 97%   BMI 31.69 kg/m   Physical Exam awake, alert.  Chest with distant breath sounds bilaterally.  Heart with regular rate and rhythm.  Abdomen obese, soft, positive bowel sounds, nontender.  Trace pretibial edema bilaterally.  Imaging: US Renal  Result Date: 02/24/2018 CLINICAL DATA:  History of right hydronephrosis and right nephrolithiasis status post lithotripsy and attempted nephrolithotomy. EXAM: RENAL / URINARY TRACT ULTRASOUND COMPLETE COMPARISON:  02/09/2018 outside renal  sonogram. 08/18/2017 CT abdomen/pelvis. FINDINGS: Right Kidney: Renal measurements: 11.7 x 5.5 x 6.6 cm = volume: 222 mL. Mildly atrophic echogenic right renal parenchyma. Severe right  hydroureteronephrosis, not substantially changed since 02/09/2018 renal sonogram, with an 8 mm shadowing stone noted in proximal to mid right ureter. Simple 1.6 cm interpolar and 1.2 cm lower right renal cysts. Left Kidney: Renal measurements: 10.7 x 7.0 x 5.8 cm = volume: 228 mL. Echogenicity within normal limits. No mass or hydronephrosis. Tiny 0.4 cm hyperechoic focus in the upper left kidney. Bladder: Small amount of layering debris in the bladder. Left ureteral jet demonstrated. No right ureteral jet visualized. IMPRESSION: 1. Severe right hydroureteronephrosis (stable since 02/09/2018 outside renal sonogram) with 8 mm proximal to mid right ureteral stone visualized. Absence of right ureteral jet. 2. Right renal parenchyma is mildly atrophic and mildly echogenic, indicative of nonspecific chronic right renal parenchymal disease. 3. Normal size left kidney. No left hydronephrosis. Possible tiny nonobstructing upper left renal stone. 4. Small simple right renal cysts.  No suspicious renal masses. 5. Small amount of layering debris in the bladder. Electronically Signed   By: Ilona Sorrel M.D.   On: 02/24/2018 09:35    Labs:  CBC: Recent Labs    12/28/17 1149 02/18/18 0823 03/01/18 1421 03/02/18 1402  WBC 6.1 6.4 12.1* 9.4  HGB 13.6 13.6 14.0 13.9  HCT 41.0 41.0 42.5 43.1  PLT 214.0 190.0 261.0 226    COAGS: Recent Labs    03/02/18 1402  INR 0.99    BMP: Recent Labs    07/31/17 1317  10/03/17 0409  12/28/17 1149 02/18/18 0823 03/01/18 1421 03/02/18 1402  NA 138   < > 138   < > 138 140 138 139  K 3.8   < > 4.4   < > 4.4 4.2 4.1 4.1  CL 105   < > 103   < > 102 105 102 106  CO2 23  --  27   < > 29 26 28 25   GLUCOSE 167*   < > 107*   < > 99 89 86 92  BUN 18   < > 26*   < > 19 31* 22 29*  CALCIUM 9.6  --  9.9   < > 10.4 10.1 10.6* 10.1  CREATININE 1.58*   < > 1.63*   < > 1.47 2.00* 1.93* 2.20*  GFRNONAA 42*  --  40*  --   --   --   --  29*  GFRAA 49*  --  47*  --   --   --   --   33*   < > = values in this interval not displayed.    LIVER FUNCTION TESTS: Recent Labs    12/17/17 0746 12/28/17 1149 02/18/18 0823 03/01/18 1421  BILITOT 0.8 0.6 0.5 0.5  AST 18 18 15 17   ALT 16 20 15 16   ALKPHOS 67 70 76 75  PROT 6.5 6.7 6.6 7.1  ALBUMIN 4.1 4.3 4.2 4.4    TUMOR MARKERS: No results for input(s): AFPTM, CEA, CA199, CHROMGRNA in the last 8760 hours.  Assessment and Plan: 73 y.o. male with history of nephrolithiasis, right ureteral stricture and hydronephrosis who underwent failed attempt at right ureteral stent placement on 02/28/2018.  He presents today for right percutaneous nephrostomy followed by attempt at right ureteral stent placement at later date.  Creat today 2.2. Risks and benefits of procedure were discussed with the patient/family including, but not limited to, infection,  bleeding, significant bleeding causing loss or decrease in renal function or damage to adjacent structures.   All of the patient's questions were answered, patient is agreeable to proceed.  Consent signed and in chart.       Thank you for this interesting consult.  I greatly enjoyed meeting David [name] and look forward to participating in their care.  A copy of this report was sent to the requesting provider on this date.  Electronically Signed: D. Rowe Robert, PA-C 03/02/2018, 3:43 PM   I spent a total of  25 minutes   in face to face in clinical consultation, greater than 50% of which was counseling/coordinating care for right percutaneous nephrostomy

## 2018-03-04 ENCOUNTER — Ambulatory Visit: Payer: Medicare Other | Admitting: Family Medicine

## 2018-03-04 ENCOUNTER — Telehealth: Payer: Self-pay | Admitting: Family Medicine

## 2018-03-04 NOTE — Telephone Encounter (Signed)
Patient presented in office today, 03/04/2018 for a 745am appt that had been cancelled on 03/01/2018.  Patient stated he needed to be seen as he was in a great deal of pain from a procedure he'd just had a couple days prior.  Dr. Charlett Blake was very concerned with the level of pain the patient stated he had and recommended the patient go to the ED.  Larene Beach and Princess spoke to the patient in private and the patient advised he didn't know what was a "normal" amount of pain.  Being that we do not specialize in Urology, [facility] called [facility] and spoke to an RN named Mickel Baas to give further advice. Mickel Baas spoke to the patient and advised patient of what pain medications he should take, how much and how often.  The patient has another appointment for a 2nd procedure on 03/14/2018.  Larene Beach gave patient the number to call for [facility], the procedure information for 03/14/2018 and the sx's that Mickel Baas advised would indicate something could be going on.  Patient acknowledged understanding.

## 2018-03-07 ENCOUNTER — Telehealth: Payer: Self-pay | Admitting: *Deleted

## 2018-03-07 NOTE — Telephone Encounter (Signed)
Received Medical records from [facility] [place]; forwarded to provider/SLS 02/24

## 2018-03-10 ENCOUNTER — Other Ambulatory Visit: Payer: Self-pay | Admitting: Radiology

## 2018-03-10 ENCOUNTER — Other Ambulatory Visit: Payer: Self-pay | Admitting: Family Medicine

## 2018-03-10 DIAGNOSIS — N289 Disorder of kidney and ureter, unspecified: Secondary | ICD-10-CM

## 2018-03-11 ENCOUNTER — Other Ambulatory Visit (INDEPENDENT_AMBULATORY_CARE_PROVIDER_SITE_OTHER): Payer: Medicare Other

## 2018-03-11 DIAGNOSIS — N289 Disorder of kidney and ureter, unspecified: Secondary | ICD-10-CM

## 2018-03-11 LAB — BASIC METABOLIC PANEL
BUN: 23 mg/dL (ref 6–23)
CO2: 28 mEq/L (ref 19–32)
Calcium: 10.3 mg/dL (ref 8.4–10.5)
Chloride: 103 mEq/L (ref 96–112)
Creatinine, Ser: 1.8 mg/dL — ABNORMAL HIGH (ref 0.40–1.50)
GFR: 37.19 mL/min — ABNORMAL LOW (ref >60.00)
Glucose, Bld: 90 mg/dL (ref 70–99)
Potassium: 4.4 mEq/L (ref 3.5–5.1)
Sodium: 140 mEq/L (ref 135–145)

## 2018-03-14 ENCOUNTER — Other Ambulatory Visit (HOSPITAL_COMMUNITY): Payer: Self-pay | Admitting: Diagnostic Radiology

## 2018-03-14 ENCOUNTER — Encounter (HOSPITAL_COMMUNITY): Payer: Self-pay

## 2018-03-14 ENCOUNTER — Other Ambulatory Visit: Payer: Self-pay

## 2018-03-14 ENCOUNTER — Ambulatory Visit (HOSPITAL_COMMUNITY)
Admission: RE | Admit: 2018-03-14 | Discharge: 2018-03-14 | Disposition: A | Discharge status: Home / Self Care | Payer: Medicare Other | Source: Ambulatory Visit | Attending: Urology | Admitting: Urology

## 2018-03-14 ENCOUNTER — Ambulatory Visit (HOSPITAL_COMMUNITY)
Admission: RE | Admit: 2018-03-14 | Discharge: 2018-03-14 | Disposition: A | Discharge status: Home / Self Care | Payer: Medicare Other | Source: Ambulatory Visit | Attending: Diagnostic Radiology | Admitting: Diagnostic Radiology

## 2018-03-14 DIAGNOSIS — Z79899 Other long term (current) drug therapy: Secondary | ICD-10-CM | POA: Diagnosis not present

## 2018-03-14 DIAGNOSIS — K219 Gastro-esophageal reflux disease without esophagitis: Secondary | ICD-10-CM | POA: Insufficient documentation

## 2018-03-14 DIAGNOSIS — M109 Gout, unspecified: Secondary | ICD-10-CM | POA: Diagnosis not present

## 2018-03-14 DIAGNOSIS — I10 Essential (primary) hypertension: Secondary | ICD-10-CM | POA: Diagnosis not present

## 2018-03-14 DIAGNOSIS — N131 Hydronephrosis with ureteral stricture, not elsewhere classified: Secondary | ICD-10-CM | POA: Diagnosis not present

## 2018-03-14 DIAGNOSIS — K589 Irritable bowel syndrome without diarrhea: Secondary | ICD-10-CM | POA: Insufficient documentation

## 2018-03-14 DIAGNOSIS — I4510 Unspecified right bundle-branch block: Secondary | ICD-10-CM | POA: Diagnosis not present

## 2018-03-14 DIAGNOSIS — F329 Major depressive disorder, single episode, unspecified: Secondary | ICD-10-CM | POA: Diagnosis not present

## 2018-03-14 DIAGNOSIS — M1990 Unspecified osteoarthritis, unspecified site: Secondary | ICD-10-CM | POA: Insufficient documentation

## 2018-03-14 DIAGNOSIS — E785 Hyperlipidemia, unspecified: Secondary | ICD-10-CM | POA: Diagnosis not present

## 2018-03-14 DIAGNOSIS — N1339 Other hydronephrosis: Secondary | ICD-10-CM

## 2018-03-14 DIAGNOSIS — N200 Calculus of kidney: Secondary | ICD-10-CM | POA: Insufficient documentation

## 2018-03-14 HISTORY — PX: IR URETERAL STENT PLACEMENT EXISTING ACCESS RIGHT: IMG6074

## 2018-03-14 HISTORY — PX: IR NEPHROSTOMY EXCHANGE RIGHT: IMG6070

## 2018-03-14 HISTORY — PX: IR BALLOON DILATION URETERAL STRICTURE RIGHT: IMG6082

## 2018-03-14 LAB — BASIC METABOLIC PANEL
Anion gap: 8 (ref 5–15)
BUN: 20 mg/dL (ref 8–23)
CO2: 25 mmol/L (ref 22–32)
Calcium: 10.2 mg/dL (ref 8.9–10.3)
Chloride: 105 mmol/L (ref 98–111)
Creatinine, Ser: 1.68 mg/dL — ABNORMAL HIGH (ref 0.61–1.24)
GFR calc Af Amer: 46 mL/min — ABNORMAL LOW (ref >60)
GFR calc non Af Amer: 40 mL/min — ABNORMAL LOW (ref >60)
Glucose, Bld: 98 mg/dL (ref 70–99)
Potassium: 4.5 mmol/L (ref 3.5–5.1)
Sodium: 138 mmol/L (ref 135–145)

## 2018-03-14 LAB — CBC WITH DIFFERENTIAL/PLATELET
Abs Immature Granulocytes: 0.03 10*3/uL (ref 0.00–0.07)
BASOS ABS: 0.1 10*3/uL (ref 0.0–0.1)
Basophils Relative: 1 %
Eosinophils Absolute: 0.3 10*3/uL (ref 0.0–0.5)
Eosinophils Relative: 3 %
HCT: 45 % (ref 39.0–52.0)
Hemoglobin: 14.7 g/dL (ref 13.0–17.0)
Immature Granulocytes: 0 %
Lymphocytes Relative: 20 %
Lymphs Abs: 1.8 10*3/uL (ref 0.7–4.0)
MCH: 30.4 pg (ref 26.0–34.0)
MCHC: 32.7 g/dL (ref 30.0–36.0)
MCV: 93.2 fL (ref 80.0–100.0)
Monocytes Absolute: 0.5 10*3/uL (ref 0.1–1.0)
Monocytes Relative: 6 %
Neutro Abs: 6.3 10*3/uL (ref 1.7–7.7)
Neutrophils Relative %: 70 %
PLATELETS: 227 10*3/uL (ref 150–400)
RBC: 4.83 MIL/uL (ref 4.22–5.81)
RDW: 13.2 % (ref 11.5–15.5)
WBC: 9 10*3/uL (ref 4.0–10.5)
nRBC: 0 % (ref 0.0–0.2)

## 2018-03-14 LAB — PROTIME-INR
INR: 0.9 (ref 0.8–1.2)
Prothrombin Time: 11.7 seconds (ref 11.4–15.2)

## 2018-03-14 MED ORDER — FENTANYL CITRATE (PF) 100 MCG/2ML IJ SOLN
INTRAMUSCULAR | Status: AC
  Filled 2018-03-14: qty 4

## 2018-03-14 MED ORDER — LIDOCAINE HCL (PF) 1 % IJ SOLN
INTRAMUSCULAR | Status: AC | PRN
  Administered 2018-03-14: 10 mL

## 2018-03-14 MED ORDER — FENTANYL CITRATE (PF) 100 MCG/2ML IJ SOLN
INTRAMUSCULAR | Status: AC | PRN
  Administered 2018-03-14 (×4): 50 ug via INTRAVENOUS

## 2018-03-14 MED ORDER — MIDAZOLAM HCL 2 MG/2ML IJ SOLN
INTRAMUSCULAR | Status: AC | PRN
  Administered 2018-03-14: 2 mg via INTRAVENOUS
  Administered 2018-03-14 (×4): 1 mg via INTRAVENOUS

## 2018-03-14 MED ORDER — IOPAMIDOL (ISOVUE-300) INJECTION 61%
50.0000 mL | Freq: Once | INTRAVENOUS | Status: AC | PRN
  Administered 2018-03-14: 35 mL

## 2018-03-14 MED ORDER — CIPROFLOXACIN IN D5W 400 MG/200ML IV SOLN
INTRAVENOUS | Status: AC
  Administered 2018-03-14: 400 mg via INTRAVENOUS
  Filled 2018-03-14: qty 200

## 2018-03-14 MED ORDER — LIDOCAINE HCL 1 % IJ SOLN
INTRAMUSCULAR | Status: AC
  Filled 2018-03-14: qty 20

## 2018-03-14 MED ORDER — MIDAZOLAM HCL 2 MG/2ML IJ SOLN
INTRAMUSCULAR | Status: AC
  Filled 2018-03-14: qty 6

## 2018-03-14 MED ORDER — CIPROFLOXACIN IN D5W 400 MG/200ML IV SOLN
400.0000 mg | INTRAVENOUS | Status: AC
  Administered 2018-03-14: 400 mg via INTRAVENOUS

## 2018-03-14 MED ORDER — IOPAMIDOL (ISOVUE-300) INJECTION 61%
INTRAVENOUS | Status: AC
  Filled 2018-03-14: qty 50

## 2018-03-14 MED ORDER — HYDROCODONE-ACETAMINOPHEN 5-325 MG PO TABS: 1.0000 | ORAL_TABLET | ORAL | Status: DC | PRN

## 2018-03-14 MED ORDER — SODIUM CHLORIDE 0.9 % IV SOLN: INTRAVENOUS | Status: DC

## 2018-03-14 NOTE — H&P (Signed)
Referring Physician(s): Penny Pia  Supervising Physician: Jacqulynn Cadet  Patient Status:  [facility]  Chief Complaint:  Nephrolithiasis, right ureteral stricture/hydronephrosis  Subjective: Patient familiar to IR service from recent right nephrostomy placement on 03/02/2018.  He has a history of nephrolithiasis, right ureteral stricture and hydronephrosis and underwent failed attempt at ureteral stent placement on 02/28/2018.  He presents again today for right nephrostogram with attempted right ureteral stent placement.  He currently denies fever, headache, chest pain, dyspnea, cough, nausea, vomiting or bleeding.  He does have some intermittent abdominal and back discomfort.  Past Medical History:  Diagnosis Date  . Abnormal liver function 03/18/2013  . Adenoma of right adrenal gland 07/11/2002   2.4cm , noted on CT ABD  . Anxiety   . Arthritis of both knees 03/26/2016  . Astigmatism   . Atrial fibrillation ([place])    x1 successful cardioversions  . Back pain   . BCC (basal cell carcinoma of skin)    under right eye and right ear  . Cataract 09/29/2016  . Depression   . Diverticulitis   . Diverticulosis   . Fatty liver   . GERD (gastroesophageal reflux disease)   . Gout   . Hearing loss of both ears 03/26/2016  . History of chronic prostatitis    started at age 75  . History of kidney stones   . Hyperglycemia 03/29/2016  . Hyperlipidemia   . Hypertension   . IBS (irritable bowel syndrome) 03/18/2013  . Incomplete right bundle branch block (RBBB)   . Internal hemorrhoids   . Kidney lesion 06/07/2015   Right midportion, 1.1x1.1 cm hyperechoic, noted on [place] ABD  . LAFB (left anterior fascicular block)   . Lipoma of axilla 09/2016  . Liver lesion, right lobe 06/07/2015   2.5x2.4x2.4 cm hypoechoic lesion posterior aspect, noted on [place] ABD  . Low back pain 03/26/2016  . LVH (left ventricular hypertrophy) 08/06/2017   Moderate, noted on ECHO  . Medicare annual wellness visit,  subsequent 03/12/2014  . Muscle cramps   . Nocturia   . OA (osteoarthritis)    Back, Hands, Neck  . Obesity 11/25/2007   Qualifier: Diagnosis of  By: Lenna Gilford MD, Deborra Medina   . Other malaise and fatigue 03/13/2013  . Penis symptom or sign    PENIS RED NO DRAINAGE   . Premature ventricular complex   . Preventative health care 03/07/2015  . Renal insufficiency   . Scoliosis    Upper thoracic and lumbar  . Vitamin D deficiency    resolved   Past Surgical History:  Procedure Laterality Date  . CARDIOVERSION  07/31/2017  . CATARACT EXTRACTION, BILATERAL    . COLON SURGERY  2009   segmental sigmoid resection  . COLONOSCOPY    . CYSTOSCOPY WITH URETEROSCOPY AND STENT PLACEMENT Right 02/28/2018   Procedure: CYSTOSCOPY WITH URETEROSCOPY [place];  Surgeon: Kathie Rhodes, MD;  Location: [facility];  Service: Urology;  Laterality: Right;  . CYSTOSCOPY/URETEROSCOPY/HOLMIUM LASER/STENT PLACEMENT Right 11/29/2017   Procedure: CYSTOSCOPY/RETROGRADE/URETEROSCOPY/HOLMIUM LASER/STENT PLACEMENT;  Surgeon: Kathie Rhodes, MD;  Location: [facility];  Service: Urology;  Laterality: Right;  . EYE SURGERY Bilateral 01/12/2017   cataract removal  . HOLMIUM LASER APPLICATION Right 1/54/0086   Procedure: HOLMIUM LASER APPLICATION;  Surgeon: Kathie Rhodes, MD;  Location: [facility];  Service: Urology;  Laterality: Right;  . IR NEPHROSTOMY PLACEMENT RIGHT  03/02/2018  . NOSE SURGERY     Submucous resection age 18  . NUCLEAR STRESS  TEST  06/03/2009  . SKIN BIOPSY    . URETEROSCOPY WITH HOLMIUM LASER LITHOTRIPSY Right 09/24/2017   Procedure: CYSTOSCOPY, URETEROSCOPY WITH HOLMIUM LASER LITHOTRIPSY, STENT PLACEMENT;  Surgeon: Kathie Rhodes, MD;  Location: [facility];  Service: Urology;  Laterality: Right;       Allergies: Cefaclor; Cephalosporins; and Penicillins  Medications: Prior to Admission medications   Medication Sig Start Date End Date Taking? Authorizing  Provider  acetaminophen (TYLENOL) 500 MG tablet Take 1,000 mg by mouth every 6 (six) hours as needed.   Yes [provider]  atenolol (TENORMIN) 25 MG tablet TAKE ONE TABLET BY MOUTH TWICE A DAY Patient taking differently: Take 25 mg by mouth 2 (two) times daily.  09/08/17  Yes Mosie Lukes, MD  bisacodyl (DULCOLAX) 5 MG EC tablet Take 5 mg by mouth daily as needed for moderate constipation.   Yes [provider]  carboxymethylcellulose (REFRESH PLUS) 0.5 % SOLN Place 1 drop into both eyes daily.   Yes [provider]  erythromycin ophthalmic ointment Place 1 application into both eyes 2 (two) times daily.   Yes [provider]  fluticasone (FLONASE) 50 MCG/ACT nasal spray Place 2 sprays into the nose as needed. For congestion   Yes [provider]  LORazepam (ATIVAN) 1 MG tablet Take 0.5 tablets (0.5 mg total) by mouth 2 (two) times daily as needed. 12/28/17  Yes Mosie Lukes, MD  phenazopyridine (PYRIDIUM) 200 MG tablet Take 1 tablet (200 mg total) by mouth 3 (three) times daily as needed for pain. 11/29/17  Yes Kathie Rhodes, MD  tamsulosin (FLOMAX) 0.4 MG CAPS capsule Take 0.4 mg by mouth daily after supper.   Yes [provider]  alum hydroxide-mag trisilicate (GAVISCON) 52-84 MG CHEW Chew 1 tablet by mouth daily as needed for indigestion or heartburn.     [provider]  Colchicine 0.6 MG CAPS 1 tab po bid Patient taking differently: Take 0.6 mg by mouth daily as needed (gout). 1 tab po bid 09/07/17   Saguier, Percell Miller, PA-C  cyclobenzaprine (FLEXERIL) 5 MG tablet Take 1 tablet (5 mg total) by mouth 3 (three) times daily as needed for muscle spasms. Patient not taking: Reported on 02/24/2018 07/20/17   Mosie Lukes, MD  HYDROcodone-acetaminophen [facility]) 7.5-325 MG tablet Take 1-2 tablets by mouth every 4 (four) hours as needed for moderate pain. Maximum dose per 24 hours - 8 pills 02/28/18   Kathie Rhodes, MD  Menthol, Topical  Analgesic, (ICY HOT BACK EX) Apply 1 application topically daily as needed (back pain).    [provider]  omeprazole (PRILOSEC) 20 MG capsule Take 20 mg by mouth as needed.    [provider]  Probiotic Product (PROBIOTIC DAILY PO) Take 1 capsule by mouth daily.     [provider]  sertraline (ZOLOFT) 50 MG tablet Take 1 tablet (50 mg total) by mouth daily. 12/28/17   Mosie Lukes, MD     Vital Signs: BP (!) 144/84   Pulse 65   Temp 98.1 F (36.7 C) (Oral)   Resp 18   SpO2 95%   Physical Exam awake, alert.  Chest with distant breath sounds bilaterally.  Heart with regular rate and rhythm.  Abdomen obese, soft, positive bowel sounds, nontender.  Right nephrostomy intact, draining yellow urine.  Trace pretibial edema bilaterally.  Imaging: No results found.  Labs:  CBC: Recent Labs    02/18/18 0823 03/01/18 1421 03/02/18 1402 03/14/18 0947  WBC  6.4 12.1* 9.4 9.0  HGB 13.6 14.0 13.9 14.7  HCT 41.0 42.5 43.1 45.0  PLT 190.0 261.0 226 227    COAGS: Recent Labs    03/02/18 1402 03/14/18 0947  INR 0.99 0.9    BMP: Recent Labs    07/31/17 1317  10/03/17 0409  03/01/18 1421 03/02/18 1402 03/11/18 0752 03/14/18 0947  NA 138   < > 138   < > 138 139 140 138  K 3.8   < > 4.4   < > 4.1 4.1 4.4 4.5  CL 105   < > 103   < > 102 106 103 105  CO2 23  --  27   < > 28 25 28 25   GLUCOSE 167*   < > 107*   < > 86 92 90 98  BUN 18   < > 26*   < > 22 29* 23 20  CALCIUM 9.6  --  9.9   < > 10.6* 10.1 10.3 10.2  CREATININE 1.58*   < > 1.63*   < > 1.93* 2.20* 1.80* 1.68*  GFRNONAA 42*  --  40*  --   --  29*  --  40*  GFRAA 49*  --  47*  --   --  33*  --  46*   < > = values in this interval not displayed.    LIVER FUNCTION TESTS: Recent Labs    12/17/17 0746 12/28/17 1149 02/18/18 0823 03/01/18 1421  BILITOT 0.8 0.6 0.5 0.5  AST 18 18 15 17   ALT 16 20 15 16   ALKPHOS 67 70 76 75  PROT 6.5 6.7 6.6 7.1  ALBUMIN 4.1 4.3 4.2 4.4     Assessment and Plan: Pt with history of nephrolithiasis, right ureteral stricture and hydronephrosis who underwent failed attempt at ureteral stent placement on 02/28/2018.  He is status post right nephrostomy on 03/02/2018 ;he presents again today for right nephrostogram with attempted right ureteral stent placement.  Details/risks of procedure, including but not limited to, internal bleeding, infection, injury to adjacent structures, inability to place stent and need for long-term drainage discussed with patient with his understanding and consent. Creat today 1.68.   Electronically Signed: D. Rowe Robert, PA-C 03/14/2018, 11:24 AM   I spent a total of 25 minutes at the the patient's bedside AND on the patient's hospital floor or unit, greater than 50% of which was counseling/coordinating care for right nephrostogram with attempted ureteral stent placement

## 2018-03-14 NOTE — Discharge Instructions (Signed)
Intervention Radiology will see you in one week. They will do xray and then remove your external nephrostomy tube if xray is okay. Keep dressing over the nephrostomy tube clean, dry, and intact.   Ureteral Stent Implantation, Care After Refer to this sheet in the next few weeks. These instructions provide you with information about caring for yourself after your procedure. Your health care provider may also give you more specific instructions. Your treatment has been planned according to current medical practices, but problems sometimes occur. Call your health care provider if you have any problems or questions after your procedure. What can I expect after the procedure? After the procedure, it is common to have:  Nausea.  Mild pain when you urinate. You may feel this pain in your lower back or lower abdomen. Pain should stop within a few minutes after you urinate. This may last for up to 1 week.  A small amount of blood in your urine for several days. Follow these instructions at home:  Medicines  Take over-the-counter and prescription medicines only as told by your health care provider.  If you were prescribed an antibiotic medicine, take it as told by your health care provider. Do not stop taking the antibiotic even if you start to feel better.  Do not drive for 24 hours if you received a sedative.  Do not drive or operate heavy machinery while taking prescription pain medicines. Activity  Return to your normal activities as told by your health care provider. Ask your health care provider what activities are safe for you.  Do not lift anything that is heavier than 10 lb (4.5 kg). Follow this limit for 1 week after your procedure, or for as long as told by your health care provider. General instructions  Watch for any blood in your urine. Call your health care provider if the amount of blood in your urine increases.  If you have a catheter: ? Follow instructions from your health  care provider about taking care of your catheter and collection bag. ? Do not take baths, swim, or use a hot tub until your health care provider approves.  Drink enough fluid to keep your urine clear or pale yellow.  Keep all follow-up visits as told by your health care provider. This is important. Contact a health care provider if:  You have pain that gets worse or does not get better with medicine, especially pain when you urinate.  You have difficulty urinating.  You feel nauseous or you vomit repeatedly during a period of more than 2 days after the procedure. Get help right away if:  Your urine is dark red or has blood clots in it.  You are leaking urine (have incontinence).  The end of the stent comes out of your urethra.  You cannot urinate.  You have sudden, sharp, or severe pain in your abdomen or lower back.  You have a fever. This information is not intended to replace advice given to you by your health care provider. Make sure you discuss any questions you have with your health care provider. Document Released: 08/31/2012 Document Revised: 06/06/2015 Document Reviewed: 07/13/2014 Elsevier Interactive Patient Education  2019 [place].     Moderate Conscious Sedation, Adult, Care After These instructions provide you with information about caring for yourself after your procedure. Your health care provider may also give you more specific instructions. Your treatment has been planned according to current medical practices, but problems sometimes occur. Call your health care provider if  you have any problems or questions after your procedure. What can I expect after the procedure? After your procedure, it is common:  To feel sleepy for several hours.  To feel clumsy and have poor balance for several hours.  To have poor judgment for several hours.  To vomit if you eat too soon. Follow these instructions at home: For at least 24 hours after the  procedure:   Do not: ? Participate in activities where you could fall or become injured. ? Drive. ? Use heavy machinery. ? Drink alcohol. ? Take sleeping pills or medicines that cause drowsiness. ? Make important decisions or sign legal documents. ? Take care of children on your own.  Rest. Eating and drinking  Follow the diet recommended by your health care provider.  If you vomit: ? Drink water, juice, or soup when you can drink without vomiting. ? Make sure you have little or no nausea before eating solid foods. General instructions  Have a responsible adult stay with you until you are awake and alert.  Take over-the-counter and prescription medicines only as told by your health care provider.  If you smoke, do not smoke without supervision.  Keep all follow-up visits as told by your health care provider. This is important. Contact a health care provider if:  You keep feeling nauseous or you keep vomiting.  You feel light-headed.  You develop a rash.  You have a fever. Get help right away if:  You have trouble breathing. This information is not intended to replace advice given to you by your health care provider. Make sure you discuss any questions you have with your health care provider. Document Released: 10/19/2012 Document Revised: 06/03/2015 Document Reviewed: 04/20/2015 Elsevier Interactive Patient Education  2019 [employer].

## 2018-03-14 NOTE — Procedures (Signed)
Interventional Radiology Procedure Note  Procedure:  1.) Ureteroplasty 2.) Placement of an 9F 24 cm JJ ureteral stent 3.) Exchange for a new 63F PCN which was left capped as a safety tube  Complications: None  Estimated Blood Loss: None  Recommendations: - Occlusion of the mid right ureter successfully crossed, dilated and crossed again with internal stent.  - Safety PCN left in place - Return in 1 week for nephrostogram and probable PCN removal.   Signed,  Criselda Peaches, MD

## 2018-03-15 ENCOUNTER — Other Ambulatory Visit (HOSPITAL_COMMUNITY): Payer: Self-pay | Admitting: Urology

## 2018-03-15 DIAGNOSIS — N1339 Other hydronephrosis: Secondary | ICD-10-CM

## 2018-03-17 ENCOUNTER — Encounter (HOSPITAL_COMMUNITY): Payer: Self-pay | Admitting: Diagnostic Radiology

## 2018-03-17 ENCOUNTER — Ambulatory Visit (HOSPITAL_COMMUNITY)
Admission: RE | Admit: 2018-03-17 | Discharge: 2018-03-17 | Disposition: A | Discharge status: Home / Self Care | Payer: Medicare Other | Source: Ambulatory Visit | Attending: Urology | Admitting: Urology

## 2018-03-17 ENCOUNTER — Other Ambulatory Visit (HOSPITAL_COMMUNITY): Payer: Self-pay | Admitting: Urology

## 2018-03-17 DIAGNOSIS — N1339 Other hydronephrosis: Secondary | ICD-10-CM

## 2018-03-17 DIAGNOSIS — N135 Crossing vessel and stricture of ureter without hydronephrosis: Secondary | ICD-10-CM | POA: Insufficient documentation

## 2018-03-17 HISTORY — PX: IR NEPHRO TUBE REMOV/FL: IMG2342

## 2018-03-17 HISTORY — PX: IR NEPHROSTOGRAM RIGHT THRU EXISTING ACCESS: IMG6062

## 2018-03-17 MED ORDER — IOHEXOL 300 MG/ML  SOLN
50.0000 mL | Freq: Once | INTRAMUSCULAR | Status: AC | PRN
  Administered 2018-03-17: 10 mL

## 2018-03-17 NOTE — Procedures (Signed)
Interventional Radiology Procedure:   Indications: Right ureter obstruction/stricture.  Recently placed ureter stent  Procedure: Right nephrostogram and nephrostomy tube removal  Findings: Right ureter stent is patent.  Nephrostomy removed without complication  Complications: None     EBL: None  Plan: Discharge to home, follow up with Urology.     David [name] R. Anselm Pancoast, MD  Pager: [phone]

## 2018-03-22 ENCOUNTER — Ambulatory Visit (INDEPENDENT_AMBULATORY_CARE_PROVIDER_SITE_OTHER): Payer: Medicare Other | Admitting: Family Medicine

## 2018-03-22 ENCOUNTER — Encounter: Payer: Self-pay | Admitting: Family Medicine

## 2018-03-22 VITALS — BP 128/62 | HR 66 | Temp 98.0°F | Resp 18 | Wt 224.8 lb

## 2018-03-22 DIAGNOSIS — N289 Disorder of kidney and ureter, unspecified: Secondary | ICD-10-CM

## 2018-03-22 DIAGNOSIS — N1330 Unspecified hydronephrosis: Secondary | ICD-10-CM | POA: Diagnosis not present

## 2018-03-22 DIAGNOSIS — N200 Calculus of kidney: Secondary | ICD-10-CM

## 2018-03-22 DIAGNOSIS — R739 Hyperglycemia, unspecified: Secondary | ICD-10-CM

## 2018-03-22 DIAGNOSIS — E782 Mixed hyperlipidemia: Secondary | ICD-10-CM

## 2018-03-22 DIAGNOSIS — I10 Essential (primary) hypertension: Secondary | ICD-10-CM

## 2018-03-22 DIAGNOSIS — G4700 Insomnia, unspecified: Secondary | ICD-10-CM

## 2018-03-22 MED ORDER — NYSTATIN 100000 UNIT/GM EX CREA: 1.0000 "application " | TOPICAL_CREAM | Freq: Two times a day (BID) | CUTANEOUS | 1 refills | Status: DC | PRN

## 2018-03-22 MED ORDER — PHENAZOPYRIDINE HCL 200 MG PO TABS: 200.0000 mg | ORAL_TABLET | Freq: Three times a day (TID) | ORAL | 1 refills | Status: DC | PRN

## 2018-03-22 MED ORDER — LORAZEPAM 1 MG PO TABS: 1.0000 mg | ORAL_TABLET | Freq: Two times a day (BID) | ORAL | 1 refills | Status: DC | PRN

## 2018-03-22 MED ORDER — SERTRALINE HCL 50 MG PO TABS: 25.0000 mg | ORAL_TABLET | Freq: Every day | ORAL | 3 refills | Status: DC

## 2018-03-22 NOTE — Patient Instructions (Addendum)

## 2018-03-22 NOTE — Assessment & Plan Note (Signed)
Well controlled, no changes to meds. Encouraged heart healthy diet such as the DASH diet and exercise as tolerated.  °

## 2018-03-22 NOTE — Assessment & Plan Note (Signed)
Check cmp 

## 2018-03-23 LAB — COMPREHENSIVE METABOLIC PANEL
ALT: 16 U/L (ref 0–53)
AST: 18 U/L (ref 0–37)
Albumin: 4.4 g/dL (ref 3.5–5.2)
Alkaline Phosphatase: 88 U/L (ref 39–117)
BUN: 18 mg/dL (ref 6–23)
CO2: 28 mEq/L (ref 19–32)
Calcium: 10.6 mg/dL — ABNORMAL HIGH (ref 8.4–10.5)
Chloride: 101 mEq/L (ref 96–112)
Creatinine, Ser: 1.67 mg/dL — ABNORMAL HIGH (ref 0.40–1.50)
GFR: 40.54 mL/min — ABNORMAL LOW (ref >60.00)
Glucose, Bld: 83 mg/dL (ref 70–99)
Potassium: 4.5 mEq/L (ref 3.5–5.1)
Sodium: 138 mEq/L (ref 135–145)
TOTAL PROTEIN: 6.9 g/dL (ref 6.0–8.3)
Total Bilirubin: 0.5 mg/dL (ref 0.2–1.2)

## 2018-03-23 LAB — CBC WITH DIFFERENTIAL/PLATELET
Basophils Absolute: 0.1 10*3/uL (ref 0.0–0.1)
Basophils Relative: 0.7 % (ref 0.0–3.0)
EOS PCT: 3.3 % (ref 0.0–5.0)
Eosinophils Absolute: 0.3 10*3/uL (ref 0.0–0.7)
HCT: 42.1 % (ref 39.0–52.0)
Hemoglobin: 14.3 g/dL (ref 13.0–17.0)
Lymphocytes Relative: 23.6 % (ref 12.0–46.0)
Lymphs Abs: 2 10*3/uL (ref 0.7–4.0)
MCHC: 34 g/dL (ref 30.0–36.0)
MCV: 89.7 fl (ref 78.0–100.0)
Monocytes Absolute: 0.7 10*3/uL (ref 0.1–1.0)
Monocytes Relative: 8.1 % (ref 3.0–12.0)
NEUTROS PCT: 64.3 % (ref 43.0–77.0)
Neutro Abs: 5.5 10*3/uL (ref 1.4–7.7)
Platelets: 254 10*3/uL (ref 150.0–400.0)
RBC: 4.7 Mil/uL (ref 4.22–5.81)
RDW: 13.8 % (ref 11.5–15.5)
WBC: 8.6 10*3/uL (ref 4.0–10.5)

## 2018-03-23 LAB — URINALYSIS, ROUTINE W REFLEX MICROSCOPIC
BILIRUBIN URINE: NEGATIVE
Ketones, ur: NEGATIVE
Nitrite: NEGATIVE
PH: 6 (ref 5.0–8.0)
Specific Gravity, Urine: <=1.005 — AB (ref 1.000–1.030)
Total Protein, Urine: NEGATIVE
Urine Glucose: NEGATIVE
Urobilinogen, UA: 0.2 (ref 0.0–1.0)

## 2018-03-24 ENCOUNTER — Other Ambulatory Visit (INDEPENDENT_AMBULATORY_CARE_PROVIDER_SITE_OTHER): Payer: Medicare Other

## 2018-03-24 ENCOUNTER — Encounter: Payer: Self-pay | Admitting: Family Medicine

## 2018-03-24 LAB — URINE CULTURE
MICRO NUMBER:: 299837
SPECIMEN QUALITY: ADEQUATE

## 2018-03-24 LAB — VITAMIN D 25 HYDROXY (VIT D DEFICIENCY, FRACTURES): VITD: 37.19 ng/mL (ref 30.00–100.00)

## 2018-03-25 MED ORDER — SULFAMETHOXAZOLE-TRIMETHOPRIM 800-160 MG PO TABS: 1.0000 | ORAL_TABLET | Freq: Two times a day (BID) | ORAL | 0 refills | Status: DC

## 2018-03-27 NOTE — Assessment & Plan Note (Signed)
hgba1c acceptable, minimize simple carbs. Increase exercise as tolerated.  

## 2018-03-27 NOTE — Assessment & Plan Note (Signed)
He tolerated stent placement and his right flank incision site looks good.

## 2018-03-27 NOTE — Assessment & Plan Note (Signed)
May use Ativan prn

## 2018-03-27 NOTE — Assessment & Plan Note (Signed)
Is referred to urologist of his choosing for second opinion

## 2018-03-27 NOTE — Progress Notes (Signed)
Subjective:    Patient ID: David [name], male    DOB: 26-Dec-1945, 73 y.o.   MRN: [id number]  No chief complaint on file.   HPI Patient is in today for follow up. He is very anxious about how to manage his kidney stones, hydronephrosis and renal insufficiency moving forward. He has an appointment with a urologist out of [facility] soon. No fevers or pain and he feels the incision site is improved since his stent was placed. Denies CP/palp/SOB/HA/congestion/fevers/GI c/o. Taking meds as prescribed  Past Medical History:  Diagnosis Date  . Abnormal liver function 03/18/2013  . Adenoma of right adrenal gland 07/11/2002   2.4cm , noted on CT ABD  . Anxiety   . Arthritis of both knees 03/26/2016  . Astigmatism   . Atrial fibrillation ([place])    x1 successful cardioversions  . Back pain   . BCC (basal cell carcinoma of skin)    under right eye and right ear  . Cataract 09/29/2016  . Depression   . Diverticulitis   . Diverticulosis   . Fatty liver   . GERD (gastroesophageal reflux disease)   . Gout   . Hearing loss of both ears 03/26/2016  . History of chronic prostatitis    started at age 67  . History of kidney stones   . Hyperglycemia 03/29/2016  . Hyperlipidemia   . Hypertension   . IBS (irritable bowel syndrome) 03/18/2013  . Incomplete right bundle branch block (RBBB)   . Internal hemorrhoids   . Kidney lesion 06/07/2015   Right midportion, 1.1x1.1 cm hyperechoic, noted on [place] ABD  . LAFB (left anterior fascicular block)   . Lipoma of axilla 09/2016  . Liver lesion, right lobe 06/07/2015   2.5x2.4x2.4 cm hypoechoic lesion posterior aspect, noted on [place] ABD  . Low back pain 03/26/2016  . LVH (left ventricular hypertrophy) 08/06/2017   Moderate, noted on ECHO  . Medicare annual wellness visit, subsequent 03/12/2014  . Muscle cramps   . Nocturia   . OA (osteoarthritis)    Back, Hands, Neck  . Obesity 11/25/2007   Qualifier: Diagnosis of  By: Lenna Gilford MD, Deborra Medina   . Other malaise and  fatigue 03/13/2013  . Penis symptom or sign    PENIS RED NO DRAINAGE   . Premature ventricular complex   . Preventative health care 03/07/2015  . Renal insufficiency   . Scoliosis    Upper thoracic and lumbar  . Vitamin D deficiency    resolved    Past Surgical History:  Procedure Laterality Date  . CARDIOVERSION  07/31/2017  . CATARACT EXTRACTION, BILATERAL    . COLON SURGERY  2009   segmental sigmoid resection  . COLONOSCOPY    . CYSTOSCOPY WITH URETEROSCOPY AND STENT PLACEMENT Right 02/28/2018   Procedure: CYSTOSCOPY WITH URETEROSCOPY Concha Se;  Surgeon: Kathie Rhodes, MD;  Location: [facility];  Service: Urology;  Laterality: Right;  . CYSTOSCOPY/URETEROSCOPY/HOLMIUM LASER/STENT PLACEMENT Right 11/29/2017   Procedure: CYSTOSCOPY/RETROGRADE/URETEROSCOPY/HOLMIUM LASER/STENT PLACEMENT;  Surgeon: Kathie Rhodes, MD;  Location: [facility];  Service: Urology;  Laterality: Right;  . EYE SURGERY Bilateral 01/12/2017   cataract removal  . HOLMIUM LASER APPLICATION Right 7/56/4332   Procedure: HOLMIUM LASER APPLICATION;  Surgeon: Kathie Rhodes, MD;  Location: [facility];  Service: Urology;  Laterality: Right;  . IR BALLOON DILATION URETERAL STRICTURE RIGHT  03/14/2018  . IR NEPHRO TUBE REMOV/FL  03/17/2018  . IR NEPHROSTOGRAM RIGHT THRU EXISTING ACCESS  03/17/2018  . IR  NEPHROSTOMY EXCHANGE RIGHT  03/14/2018  . IR NEPHROSTOMY PLACEMENT RIGHT  03/02/2018  . IR URETERAL STENT PLACEMENT EXISTING ACCESS RIGHT  03/14/2018  . NOSE SURGERY     Submucous resection age 55  . NUCLEAR STRESS TEST  06/03/2009  . SKIN BIOPSY    . URETEROSCOPY WITH HOLMIUM LASER LITHOTRIPSY Right 09/24/2017   Procedure: CYSTOSCOPY, URETEROSCOPY WITH HOLMIUM LASER LITHOTRIPSY, STENT PLACEMENT;  Surgeon: Kathie Rhodes, MD;  Location: [facility];  Service: Urology;  Laterality: Right;    Family History  Problem Relation Age of Onset  . Heart disease Mother   . Hypertension Mother    . Stroke Mother   . Colon cancer Mother   . Breast cancer Mother   . Heart disease Father        pacemaker  . Aortic aneurysm Father   . Hypertension Father   . Heart disease Sister   . Atrial fibrillation Sister   . Obesity Sister   . Sleep apnea Sister   . Heart attack Brother   . Other Brother        muscle disease  . Arthritis Brother   . Stroke Brother   . Atrial fibrillation Brother   . Cancer Maternal Grandmother        ?  Marland Kitchen Heart attack Maternal Grandmother   . Diabetes Maternal Grandmother   . Cancer Maternal Grandfather        hodgin's lymphoma  . Heart attack Paternal Grandmother   . Anxiety disorder Paternal Grandmother   . Pneumonia Paternal Grandfather   . Heart attack Brother   . Atrial fibrillation Brother   . Atrial fibrillation Brother   . Heart attack Brother   . Hypertension Brother   . Hyperlipidemia Brother   . Heart attack Brother   . Other Brother        heart valve operation  . Atrial fibrillation Brother     Social History   Socioeconomic History  . Marital status: Married    Spouse name: Not on file  . Number of children: 3  . Years of education: Not on file  . Highest education level: Not on file  Occupational History  . Occupation: Nurse, adult: [employer]  . Financial resource strain: Not on file  . Food insecurity:    Worry: Not on file    Inability: Not on file  . Transportation needs:    Medical: Not on file    Non-medical: Not on file  Tobacco Use  . Smoking status: Former Smoker    Packs/day: 1.00    Years: 6.00    Pack years: 6.00    Types: Cigarettes    Start date: 01/12/1969  . Smokeless tobacco: Never Used  . Tobacco comment: [id number]  Substance and Sexual Activity  . Alcohol use: Not Currently    Comment: NONE SINCE July 18 2017  . Drug use: No  . Sexual activity: Yes  Lifestyle  . Physical activity:    Days per week: Not on file    Minutes per session: Not on file  . Stress: Not on file   Relationships  . Social connections:    Talks on phone: Not on file    Gets together: Not on file    Attends religious service: Not on file    Active member of club or organization: Not on file    Attends meetings of clubs or organizations: Not on file    Relationship status:  Not on file  . Intimate partner violence:    Fear of current or ex partner: Not on file    Emotionally abused: Not on file    Physically abused: Not on file    Forced sexual activity: Not on file  Other Topics Concern  . Not on file  Social History Narrative   The patient is married for the second time, he has 3 sons.   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   2 alcoholic beverages most days.   1 caffeinated beverage daily   No drug use no current tobacco use he is a prior smoker   12/24/2016    Outpatient Medications Prior to Visit  Medication Sig Dispense Refill  . acetaminophen (TYLENOL) 500 MG tablet Take 1,000 mg by mouth every 6 (six) hours as needed.    Marland Kitchen alum hydroxide-mag trisilicate (GAVISCON) 90-24 MG CHEW Chew 1 tablet by mouth daily as needed for indigestion or heartburn.     Marland Kitchen atenolol (TENORMIN) 25 MG tablet TAKE ONE TABLET BY MOUTH TWICE A DAY (Patient taking differently: Take 25 mg by mouth 2 (two) times daily. ) 180 tablet 2  . bisacodyl (DULCOLAX) 5 MG EC tablet Take 5 mg by mouth daily as needed for moderate constipation.    . carboxymethylcellulose (REFRESH PLUS) 0.5 % SOLN Place 1 drop into both eyes daily.    . Colchicine 0.6 MG CAPS 1 tab po bid (Patient taking differently: Take 0.6 mg by mouth daily as needed (gout). 1 tab po bid) 60 capsule 0  . cyclobenzaprine (FLEXERIL) 5 MG tablet Take 1 tablet (5 mg total) by mouth 3 (three) times daily as needed for muscle spasms. (Patient not taking: Reported on 02/24/2018) 30 tablet 1  . erythromycin ophthalmic ointment Place 1 application into both eyes 2 (two) times daily.    . fluticasone (FLONASE) 50 MCG/ACT nasal spray Place 2 sprays  into the nose as needed. For congestion    . HYDROcodone-acetaminophen (NORCO) 7.5-325 MG tablet Take 1-2 tablets by mouth every 4 (four) hours as needed for moderate pain. Maximum dose per 24 hours - 8 pills 20 tablet 0  . Menthol, Topical Analgesic, (ICY HOT BACK EX) Apply 1 application topically daily as needed (back pain).    Marland Kitchen omeprazole (PRILOSEC) 20 MG capsule Take 20 mg by mouth as needed.    . phenazopyridine (PYRIDIUM) 200 MG tablet Take 1 tablet (200 mg total) by mouth 3 (three) times daily as needed for pain. 20 tablet 0  . Probiotic Product (PROBIOTIC DAILY PO) Take 1 capsule by mouth daily.     . tamsulosin (FLOMAX) 0.4 MG CAPS capsule Take 0.4 mg by mouth daily after supper.    Marland Kitchen LORazepam (ATIVAN) 1 MG tablet Take 0.5 tablets (0.5 mg total) by mouth 2 (two) times daily as needed. 90 tablet 1  . sertraline (ZOLOFT) 50 MG tablet Take 1 tablet (50 mg total) by mouth daily. 30 tablet 3   No facility-administered medications prior to visit.     Allergies  Allergen Reactions  . Cefaclor Hives  . Cephalosporins Hives  . Penicillins Rash    Mild maculopapular rash Has patient had a PCN reaction causing immediate rash, facial/tongue/throat swelling, SOB or lightheadedness with hypotension: No Has patient had a PCN reaction causing severe rash involving mucus membranes or skin necrosis: No Has patient had a PCN reaction that required hospitalization: No Has patient had a PCN reaction occurring within the last 10 years: No If all  of the above answers are "NO", then may proceed with Cephalosporin use.     Review of Systems  Constitutional: Positive for malaise/fatigue. Negative for fever.  HENT: Negative for congestion.   Eyes: Negative for blurred vision.  Respiratory: Negative for shortness of breath.   Cardiovascular: Negative for chest pain, palpitations and leg swelling.  Gastrointestinal: Negative for abdominal pain, blood in stool and nausea.  Genitourinary: Negative for  dysuria and frequency.  Musculoskeletal: Negative for falls.  Skin: Positive for rash.  Neurological: Negative for dizziness, loss of consciousness and headaches.  Endo/Heme/Allergies: Negative for environmental allergies.  Psychiatric/Behavioral: Positive for depression. The patient is nervous/anxious.        Objective:    Physical Exam Vitals signs and nursing note reviewed.  Constitutional:      General: He is not in acute distress.    Appearance: He is well-developed.  HENT:     Head: Normocephalic and atraumatic.     Nose: Nose normal.  Eyes:     General:        Right eye: No discharge.        Left eye: No discharge.  Neck:     Musculoskeletal: Normal range of motion and neck supple.  Cardiovascular:     Rate and Rhythm: Normal rate and regular rhythm.     Heart sounds: No murmur.  Pulmonary:     Effort: Pulmonary effort is normal.     Breath sounds: Normal breath sounds.  Abdominal:     General: Bowel sounds are normal.     Palpations: Abdomen is soft.     Tenderness: There is no abdominal tenderness.  Skin:    General: Skin is warm and dry.     Comments: Right flank bandage removed skin without fluctuance or erythema. No discharge.   Neurological:     Mental Status: He is alert and oriented to person, place, and time.     BP 128/62 (BP Location: Left Arm, Patient Position: Sitting, Cuff Size: Normal)   Pulse 66   Temp 98 F (36.7 C) (Oral)   Resp 18   Wt 224 lb 12.8 oz (102 kg)   SpO2 97%   BMI 30.49 kg/m  Wt Readings from Last 3 Encounters:  03/22/18 224 lb 12.8 oz (102 kg)  03/02/18 233 lb 11 oz (106 kg)  03/01/18 234 lb 6.4 oz (106.3 kg)     Lab Results  Component Value Date   WBC 8.6 03/22/2018   HGB 14.3 03/22/2018   HCT 42.1 03/22/2018   PLT 254.0 03/22/2018   GLUCOSE 83 03/22/2018   CHOL 202 (H) 10/08/2017   TRIG 94.0 10/08/2017   HDL 33.00 (L) 10/08/2017   LDLDIRECT 168.5 03/29/2006   LDLCALC 151 (H) 10/08/2017   ALT 16 03/22/2018    AST 18 03/22/2018   NA 138 03/22/2018   K 4.5 03/22/2018   CL 101 03/22/2018   CREATININE 1.67 (H) 03/22/2018   BUN 18 03/22/2018   CO2 28 03/22/2018   TSH 3.05 10/08/2017   PSA 3.36 11/15/2017   INR 0.9 03/14/2018   HGBA1C 5.7 10/08/2017    Lab Results  Component Value Date   TSH 3.05 10/08/2017   Lab Results  Component Value Date   WBC 8.6 03/22/2018   HGB 14.3 03/22/2018   HCT 42.1 03/22/2018   MCV 89.7 03/22/2018   PLT 254.0 03/22/2018   Lab Results  Component Value Date   NA 138 03/22/2018   K 4.5 03/22/2018  CO2 28 03/22/2018   GLUCOSE 83 03/22/2018   BUN 18 03/22/2018   CREATININE 1.67 (H) 03/22/2018   BILITOT 0.5 03/22/2018   ALKPHOS 88 03/22/2018   AST 18 03/22/2018   ALT 16 03/22/2018   PROT 6.9 03/22/2018   ALBUMIN 4.4 03/22/2018   CALCIUM 10.6 (H) 03/22/2018   ANIONGAP 8 03/14/2018   GFR 40.54 (L) 03/22/2018   Lab Results  Component Value Date   CHOL 202 (H) 10/08/2017   Lab Results  Component Value Date   HDL 33.00 (L) 10/08/2017   Lab Results  Component Value Date   LDLCALC 151 (H) 10/08/2017   Lab Results  Component Value Date   TRIG 94.0 10/08/2017   Lab Results  Component Value Date   CHOLHDL 6 10/08/2017   Lab Results  Component Value Date   HGBA1C 5.7 10/08/2017       Assessment & Plan:   Problem List Items Addressed This Visit    Essential hypertension, benign    Well controlled, no changes to meds. Encouraged heart healthy diet such as the DASH diet and exercise as tolerated.       Relevant Orders   CBC with Differential/Platelet (Completed)   Renal insufficiency - Primary    Check cmp      Relevant Orders   Ambulatory referral to Urology   Comprehensive metabolic panel (Completed)   Hyperglycemia    hgba1c acceptable, minimize simple carbs. Increase exercise as tolerated.       Insomnia    May use Ativan prn      Kidney stone    He tolerated stent placement and his right flank incision site looks  good.       Relevant Orders   Ambulatory referral to Urology   Urinalysis   Urine Culture (Completed)   Hydronephrosis    Is referred to urologist of his choosing for second opinion      Relevant Orders   Ambulatory referral to Urology      I have changed David [name] "David [name]"'s LORazepam and sertraline. I am also having him start on nystatin cream, phenazopyridine, and sulfamethoxazole-trimethoprim. Additionally, I am having him maintain his fluticasone, alum hydroxide-mag trisilicate, cyclobenzaprine, atenolol, Colchicine, tamsulosin, carboxymethylcellulose, (Menthol, Topical Analgesic, (ICY HOT BACK EX)), Probiotic Product (PROBIOTIC DAILY PO), phenazopyridine, omeprazole, acetaminophen, erythromycin, bisacodyl, and HYDROcodone-acetaminophen.  Meds ordered this encounter  Medications  . LORazepam (ATIVAN) 1 MG tablet    Sig: Take 1 tablet (1 mg total) by mouth 2 (two) times daily as needed for anxiety or sedation.    Dispense:  180 tablet    Refill:  1  . sertraline (ZOLOFT) 50 MG tablet    Sig: Take 0.5-1 tablets (25-50 mg total) by mouth daily.    Dispense:  30 tablet    Refill:  3  . nystatin cream (MYCOSTATIN)    Sig: Apply 1 application topically 2 (two) times daily as needed for dry skin.    Dispense:  30 g    Refill:  1  . phenazopyridine (PYRIDIUM) 200 MG tablet    Sig: Take 1 tablet (200 mg total) by mouth 3 (three) times daily as needed for pain.    Dispense:  10 tablet    Refill:  1  . sulfamethoxazole-trimethoprim (BACTRIM DS,SEPTRA DS) 800-160 MG tablet    Sig: Take 1 tablet by mouth 2 (two) times daily.    Dispense:  14 tablet    Refill:  0     Penni Homans, MD

## 2018-03-29 ENCOUNTER — Encounter: Payer: Self-pay | Admitting: Family Medicine

## 2018-04-13 ENCOUNTER — Other Ambulatory Visit: Payer: Self-pay

## 2018-04-13 ENCOUNTER — Other Ambulatory Visit (INDEPENDENT_AMBULATORY_CARE_PROVIDER_SITE_OTHER): Payer: Medicare Other

## 2018-04-13 ENCOUNTER — Other Ambulatory Visit: Payer: Medicare Other

## 2018-04-13 DIAGNOSIS — E782 Mixed hyperlipidemia: Secondary | ICD-10-CM

## 2018-04-13 DIAGNOSIS — N289 Disorder of kidney and ureter, unspecified: Secondary | ICD-10-CM | POA: Diagnosis not present

## 2018-04-13 DIAGNOSIS — R739 Hyperglycemia, unspecified: Secondary | ICD-10-CM

## 2018-04-13 DIAGNOSIS — N200 Calculus of kidney: Secondary | ICD-10-CM | POA: Diagnosis not present

## 2018-04-13 DIAGNOSIS — I10 Essential (primary) hypertension: Secondary | ICD-10-CM

## 2018-04-13 LAB — COMPREHENSIVE METABOLIC PANEL
ALT: 16 U/L (ref 0–53)
AST: 16 U/L (ref 0–37)
Albumin: 4.3 g/dL (ref 3.5–5.2)
Alkaline Phosphatase: 81 U/L (ref 39–117)
BUN: 16 mg/dL (ref 6–23)
CO2: 27 mEq/L (ref 19–32)
Calcium: 10.2 mg/dL (ref 8.4–10.5)
Chloride: 103 mEq/L (ref 96–112)
Creatinine, Ser: 1.56 mg/dL — ABNORMAL HIGH (ref 0.40–1.50)
GFR: 43.85 mL/min — ABNORMAL LOW (ref >60.00)
Glucose, Bld: 90 mg/dL (ref 70–99)
Potassium: 4.3 mEq/L (ref 3.5–5.1)
Sodium: 139 mEq/L (ref 135–145)
Total Bilirubin: 0.9 mg/dL (ref 0.2–1.2)
Total Protein: 6.7 g/dL (ref 6.0–8.3)

## 2018-04-13 LAB — URINALYSIS, ROUTINE W REFLEX MICROSCOPIC
Bilirubin Urine: NEGATIVE
Ketones, ur: NEGATIVE
Nitrite: NEGATIVE
Specific Gravity, Urine: 1.015 (ref 1.000–1.030)
Total Protein, Urine: 30 — AB
Urine Glucose: NEGATIVE
Urobilinogen, UA: 0.2 (ref 0.0–1.0)
pH: 6.5 (ref 5.0–8.0)

## 2018-04-13 LAB — LIPID PANEL
Cholesterol: 217 mg/dL — ABNORMAL HIGH (ref 0–200)
HDL: 33.2 mg/dL — ABNORMAL LOW (ref >39.00)
LDL Cholesterol: 160 mg/dL — ABNORMAL HIGH (ref 0–99)
NonHDL: 183.42
Total CHOL/HDL Ratio: 7
Triglycerides: 116 mg/dL (ref 0.0–149.0)
VLDL: 23.2 mg/dL (ref 0.0–40.0)

## 2018-04-13 LAB — CBC
HCT: 40.5 % (ref 39.0–52.0)
Hemoglobin: 13.6 g/dL (ref 13.0–17.0)
MCHC: 33.5 g/dL (ref 30.0–36.0)
MCV: 90.9 fl (ref 78.0–100.0)
Platelets: 240 10*3/uL (ref 150.0–400.0)
RBC: 4.46 Mil/uL (ref 4.22–5.81)
RDW: 14.3 % (ref 11.5–15.5)
WBC: 7.1 10*3/uL (ref 4.0–10.5)

## 2018-04-13 LAB — HEMOGLOBIN A1C: Hgb A1c MFr Bld: 5.5 % (ref 4.6–6.5)

## 2018-04-13 NOTE — Addendum Note (Signed)
Addended by: Magdalene Molly A on: 04/13/2018 08:13 AM   Modules accepted: Orders

## 2018-04-14 ENCOUNTER — Encounter: Payer: Self-pay | Admitting: Family Medicine

## 2018-04-16 ENCOUNTER — Other Ambulatory Visit: Payer: Self-pay | Admitting: Family Medicine

## 2018-04-16 LAB — URINE CULTURE
MICRO NUMBER:: 368605
SPECIMEN QUALITY:: ADEQUATE

## 2018-04-16 MED ORDER — SULFAMETHOXAZOLE-TRIMETHOPRIM 800-160 MG PO TABS: 1.0000 | ORAL_TABLET | Freq: Two times a day (BID) | ORAL | 0 refills | Status: DC

## 2018-04-19 ENCOUNTER — Ambulatory Visit (INDEPENDENT_AMBULATORY_CARE_PROVIDER_SITE_OTHER): Payer: Medicare Other | Admitting: Family Medicine

## 2018-04-19 ENCOUNTER — Other Ambulatory Visit: Payer: Self-pay

## 2018-04-19 ENCOUNTER — Other Ambulatory Visit: Payer: Self-pay | Admitting: Radiology

## 2018-04-19 ENCOUNTER — Other Ambulatory Visit (HOSPITAL_COMMUNITY): Payer: Self-pay | Admitting: Urology

## 2018-04-19 DIAGNOSIS — N289 Disorder of kidney and ureter, unspecified: Secondary | ICD-10-CM | POA: Diagnosis not present

## 2018-04-19 DIAGNOSIS — N200 Calculus of kidney: Secondary | ICD-10-CM

## 2018-04-19 DIAGNOSIS — F418 Other specified anxiety disorders: Secondary | ICD-10-CM

## 2018-04-19 DIAGNOSIS — I10 Essential (primary) hypertension: Secondary | ICD-10-CM | POA: Diagnosis not present

## 2018-04-19 DIAGNOSIS — N135 Crossing vessel and stricture of ureter without hydronephrosis: Secondary | ICD-10-CM

## 2018-04-19 DIAGNOSIS — N390 Urinary tract infection, site not specified: Secondary | ICD-10-CM

## 2018-04-19 MED ORDER — FLUOXETINE HCL 10 MG PO TABS: 5.0000 mg | ORAL_TABLET | Freq: Every day | ORAL | 3 refills | Status: DC

## 2018-04-19 NOTE — Telephone Encounter (Signed)
Made virtual visit w/ pcp

## 2018-04-19 NOTE — Assessment & Plan Note (Signed)
Sertraline caused sedation and woozey sensation even taking qhs, discontinue and start Fluozetine 5 mg qd x 1 week then increase to 10 mg daily, may continue Lorazepam bid prn

## 2018-04-19 NOTE — Assessment & Plan Note (Signed)
Patient monitoring BP and seeing 120s/70s he will continue to monitor

## 2018-04-19 NOTE — Assessment & Plan Note (Signed)
Had his stent removed but the ureter continues to be blocked so they are going to put in a nephrostomy tube tomorrow and they will then do surgery in 6 weeks or so to bypass the ureter and preserve his kidney function.

## 2018-04-19 NOTE — Assessment & Plan Note (Signed)
He has not started th Bactrim we sent in but agrees t to do so.

## 2018-04-19 NOTE — Progress Notes (Signed)
Virtual Visit via Video Note  I connected with David [name] on 04/19/18 at  1:00 PM EDT by a video enabled telemedicine application and verified that I am speaking with the correct person using two identifiers.   I discussed the limitations of evaluation and management by telemedicine and the availability of in person appointments. The patient expressed understanding and agreed to proceed. Magdalene Molly, CMA was able to get patient set up with video visit    Subjective:    Patient ID: David [name], male    DOB: [birth year], 73 y.o.   MRN: [id number]  No chief complaint on file.   HPI Patient is in today for follow up. He was recently diagnosed with a UTI from [place] but has not picked up his antibiotic, notes some fatigue and flank pain but no dysuria or hematuria. Using pyridium prn. Did not tolerate sertraline due to being woozey, lorazepam helps the irritability and anxiety. Notes anhedonia but no suicidal ideation is noted. Notes some headaches, and nausea but no vomiting and diarrhea. Denies CP/palp/SOB/congestion/fevers. Taking meds as prescribed  Past Medical History:  Diagnosis Date   Abnormal liver function 03/18/2013   Adenoma of right adrenal gland 07/11/2002   2.4cm , noted on CT ABD   Anxiety    Arthritis of both knees 03/26/2016   Astigmatism    Atrial fibrillation (HCC)    x1 successful cardioversions   Back pain    BCC (basal cell carcinoma of skin)    under right eye and right ear   Cataract 09/29/2016   Depression    Diverticulitis    Diverticulosis    Fatty liver    GERD (gastroesophageal reflux disease)    Gout    Hearing loss of both ears 03/26/2016   History of chronic prostatitis    started at age 32   History of kidney stones    Hyperglycemia 03/29/2016   Hyperlipidemia    Hypertension    IBS (irritable bowel syndrome) 03/18/2013   Incomplete right bundle branch block (RBBB)    Internal hemorrhoids    Kidney lesion  06/07/2015   Right midportion, 1.1x1.1 cm hyperechoic, noted on [place] ABD   LAFB (left anterior fascicular block)    Lipoma of axilla 09/2016   Liver lesion, right lobe 06/07/2015   2.5x2.4x2.4 cm hypoechoic lesion posterior aspect, noted on [place] ABD   Low back pain 03/26/2016   LVH (left ventricular hypertrophy) 08/06/2017   Moderate, noted on ECHO   Medicare annual wellness visit, subsequent 03/12/2014   Muscle cramps    Nocturia    OA (osteoarthritis)    Back, Hands, Neck   Obesity 11/25/2007   Qualifier: Diagnosis of  By: Lenna Gilford MD, Deborra Medina    Other malaise and fatigue 03/13/2013   Penis symptom or sign    PENIS RED NO DRAINAGE    Premature ventricular complex    Preventative health care 03/07/2015   Renal insufficiency    Scoliosis    Upper thoracic and lumbar   Vitamin D deficiency    resolved    Past Surgical History:  Procedure Laterality Date   CARDIOVERSION  07/31/2017   CATARACT EXTRACTION, BILATERAL     COLON SURGERY  2009   segmental sigmoid resection   COLONOSCOPY     CYSTOSCOPY WITH URETEROSCOPY AND STENT PLACEMENT Right 02/28/2018   Procedure: CYSTOSCOPY WITH URETEROSCOPY Concha Se;  Surgeon: Kathie Rhodes, MD;  Location: [facility];  Service: Urology;  Laterality: Right;   CYSTOSCOPY/URETEROSCOPY/HOLMIUM  LASER/STENT PLACEMENT Right 11/29/2017   Procedure: CYSTOSCOPY/RETROGRADE/URETEROSCOPY/HOLMIUM LASER/STENT PLACEMENT;  Surgeon: Kathie Rhodes, MD;  Location: [facility];  Service: Urology;  Laterality: Right;   EYE SURGERY Bilateral 01/12/2017   cataract removal   HOLMIUM LASER APPLICATION Right 5/40/9811   Procedure: HOLMIUM LASER APPLICATION;  Surgeon: Kathie Rhodes, MD;  Location: [facility];  Service: Urology;  Laterality: Right;   IR BALLOON DILATION URETERAL STRICTURE RIGHT  03/14/2018   IR NEPHRO TUBE REMOV/FL  03/17/2018   IR NEPHROSTOGRAM RIGHT THRU EXISTING ACCESS  03/17/2018   IR NEPHROSTOMY EXCHANGE  RIGHT  03/14/2018   IR NEPHROSTOMY PLACEMENT RIGHT  03/02/2018   IR URETERAL STENT PLACEMENT EXISTING ACCESS RIGHT  03/14/2018   NOSE SURGERY     Submucous resection age 3   NUCLEAR STRESS TEST  06/03/2009   SKIN BIOPSY     URETEROSCOPY WITH HOLMIUM LASER LITHOTRIPSY Right 09/24/2017   Procedure: CYSTOSCOPY, URETEROSCOPY WITH HOLMIUM LASER LITHOTRIPSY, STENT PLACEMENT;  Surgeon: Kathie Rhodes, MD;  Location: [facility];  Service: Urology;  Laterality: Right;    Family History  Problem Relation Age of Onset   Heart disease Mother    Hypertension Mother    Stroke Mother    Colon cancer Mother    Breast cancer Mother    Heart disease Father        pacemaker   Aortic aneurysm Father    Hypertension Father    Heart disease Sister    Atrial fibrillation Sister    Obesity Sister    Sleep apnea Sister    Heart attack Brother    Other Brother        muscle disease   Arthritis Brother    Stroke Brother    Atrial fibrillation Brother    Cancer Maternal Grandmother        ?   Heart attack Maternal Grandmother    Diabetes Maternal Grandmother    Cancer Maternal Grandfather        hodgin's lymphoma   Heart attack Paternal Grandmother    Anxiety disorder Paternal Grandmother    Pneumonia Paternal Grandfather    Heart attack Brother    Atrial fibrillation Brother    Atrial fibrillation Brother    Heart attack Brother    Hypertension Brother    Hyperlipidemia Brother    Heart attack Brother    Other Brother        heart valve operation   Atrial fibrillation Brother     Social History   Socioeconomic History   Marital status: Married    Spouse name: Not on file   Number of children: 3   Years of education: Not on file   Highest education level: Not on file  Occupational History   Occupation: Nurse, adult: [employer]  Social Needs   Financial resource strain: Not on file   Food insecurity:    Worry: Not on file     Inability: Not on file   Transportation needs:    Medical: Not on file    Non-medical: Not on file  Tobacco Use   Smoking status: Former Smoker    Packs/day: 1.00    Years: 6.00    Pack years: 6.00    Types: Cigarettes    Start date: 01/12/1969   Smokeless tobacco: Never Used   Tobacco comment: [id number]  Substance and Sexual Activity   Alcohol use: Not Currently    Comment: NONE SINCE July 18 2017   Drug use: No  Sexual activity: Yes  Lifestyle   Physical activity:    Days per week: Not on file    Minutes per session: Not on file   Stress: Not on file  Relationships   Social connections:    Talks on phone: Not on file    Gets together: Not on file    Attends religious service: Not on file    Active member of club or organization: Not on file    Attends meetings of clubs or organizations: Not on file    Relationship status: Not on file   Intimate partner violence:    Fear of current or ex partner: Not on file    Emotionally abused: Not on file    Physically abused: Not on file    Forced sexual activity: Not on file  Other Topics Concern   Not on file  Social History Narrative   The patient is married for the second time, he has 3 sons.   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   2 alcoholic beverages most days.   1 caffeinated beverage daily   No drug use no current tobacco use he is a prior smoker   12/24/2016    Outpatient Medications Prior to Visit  Medication Sig Dispense Refill   acetaminophen (TYLENOL) 500 MG tablet Take 1,000 mg by mouth every 6 (six) hours as needed.     alum hydroxide-mag trisilicate (GAVISCON) 86-76 MG CHEW Chew 1 tablet by mouth daily as needed for indigestion or heartburn.      atenolol (TENORMIN) 25 MG tablet TAKE ONE TABLET BY MOUTH TWICE A DAY (Patient taking differently: Take 25 mg by mouth 2 (two) times daily. ) 180 tablet 2   bisacodyl (DULCOLAX) 5 MG EC tablet Take 5 mg by mouth daily as needed for moderate  constipation.     carboxymethylcellulose (REFRESH PLUS) 0.5 % SOLN Place 1 drop into both eyes daily.     Colchicine 0.6 MG CAPS 1 tab po bid (Patient taking differently: Take 0.6 mg by mouth daily as needed (gout). 1 tab po bid) 60 capsule 0   cyclobenzaprine (FLEXERIL) 5 MG tablet Take 1 tablet (5 mg total) by mouth 3 (three) times daily as needed for muscle spasms. (Patient not taking: Reported on 02/24/2018) 30 tablet 1   erythromycin ophthalmic ointment Place 1 application into both eyes 2 (two) times daily.     fluticasone (FLONASE) 50 MCG/ACT nasal spray Place 2 sprays into the nose as needed. For congestion     HYDROcodone-acetaminophen (NORCO) 7.5-325 MG tablet Take 1-2 tablets by mouth every 4 (four) hours as needed for moderate pain. Maximum dose per 24 hours - 8 pills 20 tablet 0   LORazepam (ATIVAN) 1 MG tablet Take 1 tablet (1 mg total) by mouth 2 (two) times daily as needed for anxiety or sedation. 180 tablet 1   Menthol, Topical Analgesic, (ICY HOT BACK EX) Apply 1 application topically daily as needed (back pain).     nystatin cream (MYCOSTATIN) Apply 1 application topically 2 (two) times daily as needed for dry skin. 30 g 1   omeprazole (PRILOSEC) 20 MG capsule Take 20 mg by mouth as needed.     phenazopyridine (PYRIDIUM) 200 MG tablet Take 1 tablet (200 mg total) by mouth 3 (three) times daily as needed for pain. 20 tablet 0   phenazopyridine (PYRIDIUM) 200 MG tablet Take 1 tablet (200 mg total) by mouth 3 (three) times daily as needed for pain. 10 tablet 1  Probiotic Product (PROBIOTIC DAILY PO) Take 1 capsule by mouth daily.      sulfamethoxazole-trimethoprim (BACTRIM DS,SEPTRA DS) 800-160 MG tablet Take 1 tablet by mouth 2 (two) times daily. 14 tablet 0   tamsulosin (FLOMAX) 0.4 MG CAPS capsule Take 0.4 mg by mouth daily after supper.     sertraline (ZOLOFT) 50 MG tablet Take 0.5-1 tablets (25-50 mg total) by mouth daily. 30 tablet 3   No facility-administered  medications prior to visit.     Allergies  Allergen Reactions   Cefaclor Hives   Cephalosporins Hives   Penicillins Rash    Mild maculopapular rash Has patient had a PCN reaction causing immediate rash, facial/tongue/throat swelling, SOB or lightheadedness with hypotension: No Has patient had a PCN reaction causing severe rash involving mucus membranes or skin necrosis: No Has patient had a PCN reaction that required hospitalization: No Has patient had a PCN reaction occurring within the last 10 years: No If all of the above answers are "NO", then may proceed with Cephalosporin use.     Review of Systems  Constitutional: Positive for malaise/fatigue. Negative for fever.  HENT: Negative for congestion.   Eyes: Negative for blurred vision.  Respiratory: Negative for shortness of breath.   Cardiovascular: Negative for chest pain, palpitations and leg swelling.  Gastrointestinal: Positive for nausea. Negative for abdominal pain and blood in stool.  Genitourinary: Negative for dysuria and frequency.  Musculoskeletal: Positive for back pain. Negative for falls.  Skin: Negative for rash.  Neurological: Positive for dizziness and headaches. Negative for loss of consciousness.  Endo/Heme/Allergies: Negative for environmental allergies.  Psychiatric/Behavioral: Positive for depression. The patient is nervous/anxious.        Objective:    Physical Exam Vitals signs reviewed.  Constitutional:      Appearance: Normal appearance.  HENT:     Nose: Nose normal.  Pulmonary:     Effort: Pulmonary effort is normal.  Neurological:     General: No focal deficit present.     Mental Status: He is alert and oriented to person, place, and time.  Psychiatric:        Mood and Affect: Mood normal.        Behavior: Behavior normal.     There were no vitals taken for this visit. Wt Readings from Last 3 Encounters:  03/22/18 224 lb 12.8 oz (102 kg)  03/02/18 233 lb 11 oz (106 kg)    03/01/18 234 lb 6.4 oz (106.3 kg)    Diabetic Foot Exam - Simple   No data filed     Lab Results  Component Value Date   WBC 7.1 04/13/2018   HGB 13.6 04/13/2018   HCT 40.5 04/13/2018   PLT 240.0 04/13/2018   GLUCOSE 90 04/13/2018   CHOL 217 (H) 04/13/2018   TRIG 116.0 04/13/2018   HDL 33.20 (L) 04/13/2018   LDLDIRECT 168.5 03/29/2006   LDLCALC 160 (H) 04/13/2018   ALT 16 04/13/2018   AST 16 04/13/2018   NA 139 04/13/2018   K 4.3 04/13/2018   CL 103 04/13/2018   CREATININE 1.56 (H) 04/13/2018   BUN 16 04/13/2018   CO2 27 04/13/2018   TSH 3.05 10/08/2017   PSA 3.36 11/15/2017   INR 0.9 03/14/2018   HGBA1C 5.5 04/13/2018    Lab Results  Component Value Date   TSH 3.05 10/08/2017   Lab Results  Component Value Date   WBC 7.1 04/13/2018   HGB 13.6 04/13/2018   HCT 40.5 04/13/2018  MCV 90.9 04/13/2018   PLT 240.0 04/13/2018   Lab Results  Component Value Date   NA 139 04/13/2018   K 4.3 04/13/2018   CO2 27 04/13/2018   GLUCOSE 90 04/13/2018   BUN 16 04/13/2018   CREATININE 1.56 (H) 04/13/2018   BILITOT 0.9 04/13/2018   ALKPHOS 81 04/13/2018   AST 16 04/13/2018   ALT 16 04/13/2018   PROT 6.7 04/13/2018   ALBUMIN 4.3 04/13/2018   CALCIUM 10.2 04/13/2018   ANIONGAP 8 03/14/2018   GFR 43.85 (L) 04/13/2018   Lab Results  Component Value Date   CHOL 217 (H) 04/13/2018   Lab Results  Component Value Date   HDL 33.20 (L) 04/13/2018   Lab Results  Component Value Date   LDLCALC 160 (H) 04/13/2018   Lab Results  Component Value Date   TRIG 116.0 04/13/2018   Lab Results  Component Value Date   CHOLHDL 7 04/13/2018   Lab Results  Component Value Date   HGBA1C 5.5 04/13/2018       Assessment & Plan:   Problem List Items Addressed This Visit    Essential hypertension, benign    Patient monitoring BP and seeing 120s/70s he will continue to monitor      Renal insufficiency    Hydrate well and monitor      Depression with anxiety     Sertraline caused sedation and woozey sensation even taking qhs, discontinue and start Fluozetine 5 mg qd x 1 week then increase to 10 mg daily, may continue Lorazepam bid prn      Relevant Medications   FLUoxetine (PROZAC) 10 MG tablet   Kidney stone    Had his stent removed but the ureter continues to be blocked so they are going to put in a nephrostomy tube tomorrow and they will then do surgery in 6 weeks or so to bypass the ureter and preserve his kidney function.       Urinary tract infection    He has not started th Bactrim we sent in but agrees t to do so.          I have discontinued David [name] "Fred"'s sertraline. I am also having him start on FLUoxetine. Additionally, I am having him maintain his fluticasone, alum hydroxide-mag trisilicate, cyclobenzaprine, atenolol, Colchicine, tamsulosin, carboxymethylcellulose, (Menthol, Topical Analgesic, (ICY HOT BACK EX)), Probiotic Product (PROBIOTIC DAILY PO), phenazopyridine, omeprazole, acetaminophen, erythromycin, bisacodyl, HYDROcodone-acetaminophen, LORazepam, nystatin cream, phenazopyridine, and sulfamethoxazole-trimethoprim.  Meds ordered this encounter  Medications   FLUoxetine (PROZAC) 10 MG tablet    Sig: Take 0.5-1 tablets (5-10 mg total) by mouth daily.    Dispense:  30 tablet    Refill:  3     Penni Homans, MD  I discussed the assessment and treatment plan with the patient. The patient was provided an opportunity to ask questions and all were answered. The patient agreed with the plan and demonstrated an understanding of the instructions.   The patient was advised to call back or seek an in-person evaluation if the symptoms worsen or if the condition fails to improve as anticipated.  I provided 22 minutes of non-face-to-face time during this encounter.   Penni Homans, MD

## 2018-04-19 NOTE — Assessment & Plan Note (Signed)
Hydrate well and monitor 

## 2018-04-20 ENCOUNTER — Encounter (HOSPITAL_COMMUNITY): Payer: Self-pay

## 2018-04-20 ENCOUNTER — Ambulatory Visit (HOSPITAL_COMMUNITY)
Admission: RE | Admit: 2018-04-20 | Discharge: 2018-04-20 | Disposition: A | Discharge status: Home / Self Care | Payer: Medicare Other | Source: Ambulatory Visit | Attending: Urology | Admitting: Urology

## 2018-04-20 ENCOUNTER — Other Ambulatory Visit: Payer: Self-pay

## 2018-04-20 DIAGNOSIS — M170 Bilateral primary osteoarthritis of knee: Secondary | ICD-10-CM | POA: Insufficient documentation

## 2018-04-20 DIAGNOSIS — Z79899 Other long term (current) drug therapy: Secondary | ICD-10-CM | POA: Diagnosis not present

## 2018-04-20 DIAGNOSIS — K5790 Diverticulosis of intestine, part unspecified, without perforation or abscess without bleeding: Secondary | ICD-10-CM | POA: Insufficient documentation

## 2018-04-20 DIAGNOSIS — K219 Gastro-esophageal reflux disease without esophagitis: Secondary | ICD-10-CM | POA: Insufficient documentation

## 2018-04-20 DIAGNOSIS — E669 Obesity, unspecified: Secondary | ICD-10-CM | POA: Insufficient documentation

## 2018-04-20 DIAGNOSIS — Z436 Encounter for attention to other artificial openings of urinary tract: Secondary | ICD-10-CM | POA: Insufficient documentation

## 2018-04-20 DIAGNOSIS — F419 Anxiety disorder, unspecified: Secondary | ICD-10-CM | POA: Diagnosis not present

## 2018-04-20 DIAGNOSIS — M109 Gout, unspecified: Secondary | ICD-10-CM | POA: Diagnosis not present

## 2018-04-20 DIAGNOSIS — K589 Irritable bowel syndrome without diarrhea: Secondary | ICD-10-CM | POA: Diagnosis not present

## 2018-04-20 DIAGNOSIS — I110 Hypertensive heart disease with heart failure: Secondary | ICD-10-CM | POA: Diagnosis not present

## 2018-04-20 DIAGNOSIS — F329 Major depressive disorder, single episode, unspecified: Secondary | ICD-10-CM | POA: Insufficient documentation

## 2018-04-20 DIAGNOSIS — E785 Hyperlipidemia, unspecified: Secondary | ICD-10-CM | POA: Insufficient documentation

## 2018-04-20 DIAGNOSIS — N132 Hydronephrosis with renal and ureteral calculous obstruction: Secondary | ICD-10-CM | POA: Diagnosis not present

## 2018-04-20 DIAGNOSIS — N135 Crossing vessel and stricture of ureter without hydronephrosis: Secondary | ICD-10-CM

## 2018-04-20 HISTORY — PX: IR NEPHROSTOMY PLACEMENT RIGHT: IMG6064

## 2018-04-20 LAB — BASIC METABOLIC PANEL
Anion gap: 9 (ref 5–15)
BUN: 21 mg/dL (ref 8–23)
CO2: 24 mmol/L (ref 22–32)
Calcium: 9.8 mg/dL (ref 8.9–10.3)
Chloride: 106 mmol/L (ref 98–111)
Creatinine, Ser: 1.97 mg/dL — ABNORMAL HIGH (ref 0.61–1.24)
GFR calc Af Amer: 38 mL/min — ABNORMAL LOW (ref >60)
GFR calc non Af Amer: 33 mL/min — ABNORMAL LOW (ref >60)
Glucose, Bld: 95 mg/dL (ref 70–99)
Potassium: 4.2 mmol/L (ref 3.5–5.1)
Sodium: 139 mmol/L (ref 135–145)

## 2018-04-20 LAB — CBC WITH DIFFERENTIAL/PLATELET
Abs Immature Granulocytes: 0.02 10*3/uL (ref 0.00–0.07)
Basophils Absolute: 0.1 10*3/uL (ref 0.0–0.1)
Basophils Relative: 1 %
Eosinophils Absolute: 0.3 10*3/uL (ref 0.0–0.5)
Eosinophils Relative: 5 %
HCT: 37.9 % — ABNORMAL LOW (ref 39.0–52.0)
Hemoglobin: 12.3 g/dL — ABNORMAL LOW (ref 13.0–17.0)
Immature Granulocytes: 0 %
Lymphocytes Relative: 22 %
Lymphs Abs: 1.5 10*3/uL (ref 0.7–4.0)
MCH: 30.4 pg (ref 26.0–34.0)
MCHC: 32.5 g/dL (ref 30.0–36.0)
MCV: 93.8 fL (ref 80.0–100.0)
Monocytes Absolute: 0.6 10*3/uL (ref 0.1–1.0)
Monocytes Relative: 9 %
Neutro Abs: 4.2 10*3/uL (ref 1.7–7.7)
Neutrophils Relative %: 63 %
Platelets: 203 10*3/uL (ref 150–400)
RBC: 4.04 MIL/uL — ABNORMAL LOW (ref 4.22–5.81)
RDW: 13.4 % (ref 11.5–15.5)
WBC: 6.7 10*3/uL (ref 4.0–10.5)
nRBC: 0 % (ref 0.0–0.2)

## 2018-04-20 LAB — PROTIME-INR
INR: 1 (ref 0.8–1.2)
Prothrombin Time: 13.3 seconds (ref 11.4–15.2)

## 2018-04-20 LAB — GLUCOSE, CAPILLARY: Glucose-Capillary: 93 mg/dL (ref 70–99)

## 2018-04-20 MED ORDER — IOHEXOL 300 MG/ML  SOLN
50.0000 mL | Freq: Once | INTRAMUSCULAR | Status: AC | PRN
  Administered 2018-04-20: 12 mL

## 2018-04-20 MED ORDER — MIDAZOLAM HCL 2 MG/2ML IJ SOLN
INTRAMUSCULAR | Status: AC | PRN
  Administered 2018-04-20 (×3): 1 mg via INTRAVENOUS

## 2018-04-20 MED ORDER — LIDOCAINE HCL (PF) 1 % IJ SOLN
INTRAMUSCULAR | Status: AC | PRN
  Administered 2018-04-20: 10 mL

## 2018-04-20 MED ORDER — VANCOMYCIN HCL IN DEXTROSE 1-5 GM/200ML-% IV SOLN
1000.0000 mg | INTRAVENOUS | Status: AC
  Administered 2018-04-20: 11:00:00 1000 mg via INTRAVENOUS

## 2018-04-20 MED ORDER — HYDROCODONE-ACETAMINOPHEN 5-325 MG PO TABS: 1.0000 | ORAL_TABLET | ORAL | Status: DC | PRN

## 2018-04-20 MED ORDER — SODIUM CHLORIDE 0.9 % IV SOLN
INTRAVENOUS | Status: DC
  Administered 2018-04-20: 10:00:00 via INTRAVENOUS

## 2018-04-20 MED ORDER — VANCOMYCIN HCL IN DEXTROSE 1-5 GM/200ML-% IV SOLN
INTRAVENOUS | Status: AC
  Administered 2018-04-20: 11:00:00 1000 mg via INTRAVENOUS
  Filled 2018-04-20: qty 200

## 2018-04-20 MED ORDER — LIDOCAINE HCL 1 % IJ SOLN
INTRAMUSCULAR | Status: AC
  Filled 2018-04-20: qty 20

## 2018-04-20 MED ORDER — MIDAZOLAM HCL 2 MG/2ML IJ SOLN
INTRAMUSCULAR | Status: AC
  Filled 2018-04-20: qty 4

## 2018-04-20 MED ORDER — FENTANYL CITRATE (PF) 100 MCG/2ML IJ SOLN
INTRAMUSCULAR | Status: AC | PRN
  Administered 2018-04-20 (×2): 50 ug via INTRAVENOUS

## 2018-04-20 MED ORDER — FENTANYL CITRATE (PF) 100 MCG/2ML IJ SOLN
INTRAMUSCULAR | Status: AC
  Filled 2018-04-20: qty 4

## 2018-04-20 NOTE — H&P (Signed)
Referring Physician(s): Kathie Rhodes  Supervising Physician: Aletta Edouard  Patient Status:  [facility]  Chief Complaint: Nephrolithiasis, right ureteral stricture/hydronephrosis   Subjective: Patient familiar to IR service from recent right nephrostomy placement on 03/02/2018.  He has a history of nephrolithiasis, right ureteral stricture and hydronephrosis and underwent failed attempt at ureteral stent placement on 02/28/2018.  On 03/14/2018 he underwent ureteroplasty , right double-J stent placement , nephrostomy exchange and capping.  His right nephrostomy was removed on 03/17/2018 following normal nephrostogram.  He now presents with recurrent moderate to severe hydronephrosis of the right kidney by ultrasound on 04/18/2018.  He is scheduled today for repeat right nephrostomy.  He currently denies fever, headache, chest pain, dyspnea, cough, nausea, vomiting or bleeding.  He does have some intermittent mild right lat abd/ flank pressure.   Past Medical History:  Diagnosis Date  . Abnormal liver function 03/18/2013  . Adenoma of right adrenal gland 07/11/2002   2.4cm , noted on CT ABD  . Anxiety   . Arthritis of both knees 03/26/2016  . Astigmatism   . Atrial fibrillation ([place])    x1 successful cardioversions  . Back pain   . BCC (basal cell carcinoma of skin)    under right eye and right ear  . Cataract 09/29/2016  . Depression   . Diverticulitis   . Diverticulosis   . Fatty liver   . GERD (gastroesophageal reflux disease)   . Gout   . Hearing loss of both ears 03/26/2016  . History of chronic prostatitis    started at age 10  . History of kidney stones   . Hyperglycemia 03/29/2016  . Hyperlipidemia   . Hypertension   . IBS (irritable bowel syndrome) 03/18/2013  . Incomplete right bundle branch block (RBBB)   . Internal hemorrhoids   . Kidney lesion 06/07/2015   Right midportion, 1.1x1.1 cm hyperechoic, noted on [place] ABD  . LAFB (left anterior fascicular block)   . Lipoma of  axilla 09/2016  . Liver lesion, right lobe 06/07/2015   2.5x2.4x2.4 cm hypoechoic lesion posterior aspect, noted on [place] ABD  . Low back pain 03/26/2016  . LVH (left ventricular hypertrophy) 08/06/2017   Moderate, noted on ECHO  . Medicare annual wellness visit, subsequent 03/12/2014  . Muscle cramps   . Nocturia   . OA (osteoarthritis)    Back, Hands, Neck  . Obesity 11/25/2007   Qualifier: Diagnosis of  By: Lenna Gilford MD, Deborra Medina   . Other malaise and fatigue 03/13/2013  . Penis symptom or sign    PENIS RED NO DRAINAGE   . Premature ventricular complex   . Preventative health care 03/07/2015  . Renal insufficiency   . Scoliosis    Upper thoracic and lumbar  . Vitamin D deficiency    resolved   Past Surgical History:  Procedure Laterality Date  . CARDIOVERSION  07/31/2017  . CATARACT EXTRACTION, BILATERAL    . COLON SURGERY  2009   segmental sigmoid resection  . COLONOSCOPY    . CYSTOSCOPY WITH URETEROSCOPY AND STENT PLACEMENT Right 02/28/2018   Procedure: CYSTOSCOPY WITH URETEROSCOPY [place] Se;  Surgeon: Kathie Rhodes, MD;  Location: [facility];  Service: Urology;  Laterality: Right;  . CYSTOSCOPY/URETEROSCOPY/HOLMIUM LASER/STENT PLACEMENT Right 11/29/2017   Procedure: CYSTOSCOPY/RETROGRADE/URETEROSCOPY/HOLMIUM LASER/STENT PLACEMENT;  Surgeon: Kathie Rhodes, MD;  Location: [facility];  Service: Urology;  Laterality: Right;  . EYE SURGERY Bilateral 01/12/2017   cataract removal  . HOLMIUM LASER APPLICATION Right 1/61/0960   Procedure: HOLMIUM  LASER APPLICATION;  Surgeon: Kathie Rhodes, MD;  Location: [facility];  Service: Urology;  Laterality: Right;  . IR BALLOON DILATION URETERAL STRICTURE RIGHT  03/14/2018  . IR NEPHRO TUBE REMOV/FL  03/17/2018  . IR NEPHROSTOGRAM RIGHT THRU EXISTING ACCESS  03/17/2018  . IR NEPHROSTOMY EXCHANGE RIGHT  03/14/2018  . IR NEPHROSTOMY PLACEMENT RIGHT  03/02/2018  . IR URETERAL STENT PLACEMENT EXISTING ACCESS RIGHT  03/14/2018  .  NOSE SURGERY     Submucous resection age 43  . NUCLEAR STRESS TEST  06/03/2009  . SKIN BIOPSY    . URETEROSCOPY WITH HOLMIUM LASER LITHOTRIPSY Right 09/24/2017   Procedure: CYSTOSCOPY, URETEROSCOPY WITH HOLMIUM LASER LITHOTRIPSY, STENT PLACEMENT;  Surgeon: Kathie Rhodes, MD;  Location: [facility];  Service: Urology;  Laterality: Right;      Allergies: Cefaclor; Cephalosporins; and Penicillins  Medications: Prior to Admission medications   Medication Sig Start Date End Date Taking? Authorizing Provider  acetaminophen (TYLENOL) 500 MG tablet Take 1,000 mg by mouth every 6 (six) hours as needed.    [provider]  alum hydroxide-mag trisilicate (GAVISCON) 08-14 MG CHEW Chew 1 tablet by mouth daily as needed for indigestion or heartburn.     [provider]  atenolol (TENORMIN) 25 MG tablet TAKE ONE TABLET BY MOUTH TWICE A DAY Patient taking differently: Take 25 mg by mouth 2 (two) times daily.  09/08/17   Mosie Lukes, MD  bisacodyl (DULCOLAX) 5 MG EC tablet Take 5 mg by mouth daily as needed for moderate constipation.    [provider]  carboxymethylcellulose (REFRESH PLUS) 0.5 % SOLN Place 1 drop into both eyes daily.    [provider]  Colchicine 0.6 MG CAPS 1 tab po bid Patient taking differently: Take 0.6 mg by mouth daily as needed (gout). 1 tab po bid 09/07/17   Saguier, Percell Miller, PA-C  cyclobenzaprine (FLEXERIL) 5 MG tablet Take 1 tablet (5 mg total) by mouth 3 (three) times daily as needed for muscle spasms. Patient not taking: Reported on 02/24/2018 07/20/17   Mosie Lukes, MD  erythromycin ophthalmic ointment Place 1 application into both eyes 2 (two) times daily.    [provider]  FLUoxetine (PROZAC) 10 MG tablet Take 0.5-1 tablets (5-10 mg total) by mouth daily. 04/19/18   Mosie Lukes, MD  fluticasone (FLONASE) 50 MCG/ACT nasal spray Place 2 sprays into the nose as needed. For congestion    [provider]  HYDROcodone-acetaminophen (NORCO) 7.5-325 MG tablet Take 1-2 tablets by mouth every 4 (four) hours as needed for moderate pain. Maximum dose per 24 hours - 8 pills 02/28/18   Kathie Rhodes, MD  LORazepam (ATIVAN) 1 MG tablet Take 1 tablet (1 mg total) by mouth 2 (two) times daily as needed for anxiety or sedation. 03/22/18   Mosie Lukes, MD  Menthol, Topical Analgesic, (ICY HOT BACK EX) Apply 1 application topically daily as needed (back pain).    [provider]  nystatin cream (MYCOSTATIN) Apply 1 application topically 2 (two) times daily as needed for dry skin. 03/22/18   Mosie Lukes, MD  omeprazole (PRILOSEC) 20 MG capsule Take 20 mg by mouth as needed.    [provider]  phenazopyridine (PYRIDIUM) 200 MG tablet Take 1 tablet (200 mg total) by mouth 3 (three) times daily as needed for pain. 11/29/17   Kathie Rhodes, MD  phenazopyridine (PYRIDIUM) 200 MG tablet Take 1 tablet (200 mg total) by mouth 3 (three)  times daily as needed for pain. 03/22/18   Mosie Lukes, MD  Probiotic Product (PROBIOTIC DAILY PO) Take 1 capsule by mouth daily.     [provider]  sulfamethoxazole-trimethoprim (BACTRIM DS,SEPTRA DS) 800-160 MG tablet Take 1 tablet by mouth 2 (two) times daily. 04/16/18   Mosie Lukes, MD  tamsulosin (FLOMAX) 0.4 MG CAPS capsule Take 0.4 mg by mouth daily after supper.    [provider]     Vital Signs: Blood pressure 136/82, heart rate 66, temp 97.9, respirations 17, O2 sat 97% room air   Physical Exam awake, alert.  Chest with distant breath sounds bilaterally.  Heart with regular rate and rhythm.  Abdomen obese, soft, positive bowel sounds, nontender.  Trace pretibial edema bilaterally.  Imaging: No results found.  Labs:  CBC: Recent Labs    03/02/18 1402 03/14/18 0947 03/22/18 1605 04/13/18 0814  WBC 9.4 9.0 8.6 7.1  HGB 13.9 14.7 14.3 13.6  HCT 43.1 45.0 42.1 40.5  PLT 226 227 254.0 240.0    COAGS: Recent Labs     03/02/18 1402 03/14/18 0947  INR 0.99 0.9    BMP: Recent Labs    07/31/17 1317  10/03/17 0409  03/02/18 1402 03/11/18 0752 03/14/18 0947 03/22/18 1605 04/13/18 0814  NA 138   < > 138   < > 139 140 138 138 139  K 3.8   < > 4.4   < > 4.1 4.4 4.5 4.5 4.3  CL 105   < > 103   < > 106 103 105 101 103  CO2 23  --  27   < > 25 28 25 28 27   GLUCOSE 167*   < > 107*   < > 92 90 98 83 90  BUN 18   < > 26*   < > 29* 23 20 18 16   CALCIUM 9.6  --  9.9   < > 10.1 10.3 10.2 10.6* 10.2  CREATININE 1.58*   < > 1.63*   < > 2.20* 1.80* 1.68* 1.67* 1.56*  GFRNONAA 42*  --  40*  --  29*  --  40*  --   --   GFRAA 49*  --  47*  --  33*  --  46*  --   --    < > = values in this interval not displayed.    LIVER FUNCTION TESTS: Recent Labs    02/18/18 0823 03/01/18 1421 03/22/18 1605 04/13/18 0814  BILITOT 0.5 0.5 0.5 0.9  AST 15 17 18 16   ALT 15 16 16 16   ALKPHOS 76 75 88 81  PROT 6.6 7.1 6.9 6.7  ALBUMIN 4.2 4.4 4.4 4.3    Assessment and Plan: Pt with history of nephrolithiasis, right ureteral stricture and hydronephrosis who underwent failed retrograde attempt at ureteral stent placement on 02/28/2018.  He is status post right percutaneous nephrostomy on 03/02/2018.  On 03/14/2018 he underwent ureteroplasty , right double-J stent placement , nephrostomy exchange and capping.  His right nephrostomy was removed on 03/17/2018 following normal nephrostogram.  He now presents with recurrent moderate to severe hydronephrosis of the right kidney by ultrasound on 04/18/2018.  He is scheduled today for repeat right nephrostomy. Risks and benefits of procedure were discussed with the patient including, but not limited to, infection, bleeding, significant bleeding causing loss or decrease in renal function or damage to adjacent structures.  Urine culture on 04/13/2018 grew Staphylococcus lugdunensis. He was prescribed Bactrim by his primary care  physician and took his first does yesterday. Creat today 1.97.  All  of the patient's questions were answered, patient is agreeable to proceed.  Consent signed and in chart.      Electronically Signed: D. Rowe Robert, PA-C 04/20/2018, 9:45 AM   I spent a total of 20 minutes at the the patient's bedside AND on the patient's hospital floor or unit, greater than 50% of which was counseling/coordinating care for right percutaneous nephrostomy

## 2018-04-20 NOTE — Procedures (Signed)
Interventional Radiology Procedure Note  Procedure: Right percutaneous nephrostomy tube placement  Complications: None  Estimated Blood Loss: < 10 mL  Findings: Significant right hydronephrosis by [place]. 10 Fr PCN placed via interpolar access and formed in renal pelvis. Contrast injection shows proximal ureteral stricture and obstruction just above calculus. PCN placed to gravity bag drainage.  Venetia Night. Kathlene Cote, M.D Pager:  [phone]

## 2018-04-20 NOTE — Discharge Instructions (Signed)
Percutaneous Nephrostomy    Percutaneous nephrostomy is a procedure to insert a flexible tube into your kidney so that urine can leave your body. This procedure may be done if a medical condition prevents urine from leaving your kidney in the usual way. Urine is normally carried from the kidneys to the bladder through narrow tubes called ureters. A ureter can become blocked because of conditions such as kidney stones, tumors, infection, or blood clots.  The nephrostomy tube will be inserted through your back. After the procedure, the tube will remain in place, and urine will drain from the kidney into a drainage bag outside your body. Draining the urine will relieve pressure and help prevent infection that could damage the kidney. Often, this procedure allows your health care provider to identify the cause of the blockage and plan appropriate treatment.  Tell a health care provider about:  · Any allergies you have.  · All medicines you are taking, including vitamins, herbs, eye drops, creams, and over-the-counter medicines.  · Any problems you or family members have had with anesthetic medicines.  · Any blood disorders you have.  · Any surgeries you have had.  · Any medical conditions you have.  · Whether you are pregnant or may be pregnant.  What are the risks?  Generally, this is a safe procedure. However, problems may occur, including:  · Infection.  · Bleeding.  · Allergic reactions to medicines or dyes used in the procedure.  · Damage to other structures or organs.  What happens before the procedure?  · Follow instructions from your health care provider about eating or drinking restrictions.  · Ask your health care provider about:  ? Changing or stopping your regular medicines. This is especially important if you are taking diabetes medicines or blood thinners.  ? Taking medicines such as aspirin and ibuprofen. These medicines can thin your blood. Do not take these medicines before your procedure if your health  care provider instructs you not to.  · You may be given antibiotic medicine to help prevent infection.  · You may have blood tests to see how well your kidneys and liver are working and to see how well your blood can clot.  · Plan to have someone take you home from the hospital or clinic.  What happens during the procedure?  · To reduce your risk of infection:  ? Your health care team will wash or sanitize their hands.  ? Your skin will be washed with soap.  · An IV tube will be inserted into one of your veins. You may be given medicines through this IV tube to help prevent nausea and pain.  · You will be positioned on your abdomen.  · You will be given one or more of the following:  ? A medicine to help you relax (sedative).  ? A medicine to numb the area (local anesthetic) where the nephrostomy tube will be inserted.  · The nephrostomy tube, which is thin and flexible, will be inserted into a needle.  · The needle will be inserted into your body and guided to your kidney. An imaging method that uses X-ray images (fluoroscopy) will be used to help guide the needle to the kidney.  · A dye will be injected through the nephrostomy tube. Then, X-ray images that highlight your kidney will be taken.  · Next, the needle will be removed, but the nephrostomy tube will be left in your kidney. The tube may be secured to your skin   with stitches (sutures).  · A drainage bag will be attached to the nephrostomy tube. Urine will be able to drain from your kidney to this drainage bag outside your body.  The procedure may vary among health care providers and hospitals.  What happens after the procedure?  · Your blood pressure, heart rate, breathing rate, and blood oxygen level will be monitored until the medicines you were given have worn off.  · You will need to remain lying down for several hours.  · You will be taught how to care for the nephrostomy tube and the drainage bag.  · Do not drive for 24 hours if you were given a  sedative.  This information is not intended to replace advice given to you by your health care provider. Make sure you discuss any questions you have with your health care provider.  Document Released: 10/19/2012 Document Revised: 10/11/2015 Document Reviewed: 10/11/2015  Elsevier Interactive Patient Education © 2019 [employer]  Moderate Conscious Sedation, Adult, Care After  These instructions provide you with information about caring for yourself after your procedure. Your health care provider may also give you more specific instructions. Your treatment has been planned according to current medical practices, but problems sometimes occur. Call your health care provider if you have any problems or questions after your procedure.  What can I expect after the procedure?  After your procedure, it is common:  · To feel sleepy for several hours.  · To feel clumsy and have poor balance for several hours.  · To have poor judgment for several hours.  · To vomit if you eat too soon.  Follow these instructions at home:  For at least 24 hours after the procedure:    · Do not:  ? Participate in activities where you could fall or become injured.  ? Drive.  ? Use heavy machinery.  ? Drink alcohol.  ? Take sleeping pills or medicines that cause drowsiness.  ? Make important decisions or sign legal documents.  ? Take care of children on your own.  · Rest.  Eating and drinking  · Follow the diet recommended by your health care provider.  · If you vomit:  ? Drink water, juice, or soup when you can drink without vomiting.  ? Make sure you have little or no nausea before eating solid foods.  General instructions  · Have a responsible adult stay with you until you are awake and alert.  · Take over-the-counter and prescription medicines only as told by your health care provider.  · If you smoke, do not smoke without supervision.  · Keep all follow-up visits as told by your health care provider. This is important.  Contact a health  care provider if:  · You keep feeling nauseous or you keep vomiting.  · You feel light-headed.  · You develop a rash.  · You have a fever.  Get help right away if:  · You have trouble breathing.  This information is not intended to replace advice given to you by your health care provider. Make sure you discuss any questions you have with your health care provider.  Document Released: 10/19/2012 Document Revised: 06/03/2015 Document Reviewed: 04/20/2015  Elsevier Interactive Patient Education © 2019 [employer]

## 2018-04-21 ENCOUNTER — Other Ambulatory Visit: Payer: Self-pay | Admitting: Urology

## 2018-04-22 ENCOUNTER — Encounter: Payer: Medicare Other | Admitting: Family Medicine

## 2018-04-23 DIAGNOSIS — Q6211 Congenital occlusion of ureteropelvic junction: Secondary | ICD-10-CM | POA: Insufficient documentation

## 2018-04-23 DIAGNOSIS — N200 Calculus of kidney: Secondary | ICD-10-CM | POA: Insufficient documentation

## 2018-04-25 ENCOUNTER — Encounter: Payer: Self-pay | Admitting: Family Medicine

## 2018-04-25 ENCOUNTER — Other Ambulatory Visit (HOSPITAL_COMMUNITY): Payer: Self-pay | Admitting: Urology

## 2018-04-25 DIAGNOSIS — N135 Crossing vessel and stricture of ureter without hydronephrosis: Secondary | ICD-10-CM

## 2018-05-05 ENCOUNTER — Telehealth: Payer: Self-pay

## 2018-05-05 NOTE — Telephone Encounter (Signed)
I did not call to reschedule the appointment. I also do not see it marked for reschedule. Unsure of who tried to reach out to the patient and why.

## 2018-05-05 NOTE — Telephone Encounter (Signed)
Copied from [place] [phone]. Topic: Appointment Scheduling - Scheduling Inquiry for Clinic >> May 05, 2018 12:03 PM Alanda Slim E wrote: Reason for CRM: Pt has a CPE scheduled for 7.27.20 but said he was notified that it needed to be changed to 7.30.20. please advise if this is the case

## 2018-05-05 NOTE — Telephone Encounter (Signed)
No I did not call him... David [name] did you make this call  Please advise

## 2018-05-12 ENCOUNTER — Ambulatory Visit (INDEPENDENT_AMBULATORY_CARE_PROVIDER_SITE_OTHER): Payer: Medicare Other | Admitting: Family Medicine

## 2018-05-12 ENCOUNTER — Other Ambulatory Visit: Payer: Self-pay

## 2018-05-12 DIAGNOSIS — I10 Essential (primary) hypertension: Secondary | ICD-10-CM

## 2018-05-12 DIAGNOSIS — N289 Disorder of kidney and ureter, unspecified: Secondary | ICD-10-CM | POA: Diagnosis not present

## 2018-05-12 DIAGNOSIS — N200 Calculus of kidney: Secondary | ICD-10-CM | POA: Diagnosis not present

## 2018-05-12 DIAGNOSIS — R21 Rash and other nonspecific skin eruption: Secondary | ICD-10-CM

## 2018-05-12 DIAGNOSIS — R739 Hyperglycemia, unspecified: Secondary | ICD-10-CM

## 2018-05-12 MED ORDER — FLUCONAZOLE 150 MG PO TABS: 150.0000 mg | ORAL_TABLET | ORAL | 0 refills | Status: DC

## 2018-05-12 MED ORDER — CYCLOBENZAPRINE HCL 5 MG PO TABS: 5.0000 mg | ORAL_TABLET | Freq: Three times a day (TID) | ORAL | 2 refills | Status: DC | PRN

## 2018-05-12 NOTE — Assessment & Plan Note (Signed)
hgba1c acceptable, minimize simple carbs. Increase exercise as tolerated.  

## 2018-05-12 NOTE — Assessment & Plan Note (Signed)
Likely candidal. Was started on Clotrimazole bid by urology add diflucan weekly x 2 doses

## 2018-05-12 NOTE — Assessment & Plan Note (Signed)
Has his next surgery 5/26 to manage his stents and once that is done they are hopeful they will have managed his stones appropriately

## 2018-05-12 NOTE — Assessment & Plan Note (Signed)
Stabilized with stenting, continue to hydrate well

## 2018-05-12 NOTE — Assessment & Plan Note (Signed)
Well controlled, no changes to meds. Encouraged heart healthy diet such as the DASH diet and exercise as tolerated.  °

## 2018-05-12 NOTE — Progress Notes (Signed)
Virtual Visit via Video Note  I connected with David [name] on 05/12/18 at 10:20 AM EDT by a video enabled telemedicine application and verified that I am speaking with the correct person using two identifiers. Magdalene Molly, CMA was able to get the patient set up on video platform  Location: Patient: home Provider: home   I discussed the limitations of evaluation and management by telemedicine and the availability of in person appointments. The patient expressed understanding and agreed to proceed.   I discussed the assessment and treatment plan with the patient. The patient was provided an opportunity to ask questions and all were answered. The patient agreed with the plan and demonstrated an understanding of the instructions.     Subjective:    Patient ID: David [name], male    DOB: May 23, 1945, 73 y.o.   MRN: [id number]  No chief complaint on file.   HPI Patient is in today for follow up on chronic medical concerns especially Chronic Renal Insufficiency, Kidney Stones, Recurrent UTIs, HTN and more. He is feeling much better from a urinary perspective but he has developed a penile rash and was seen by urology yesterday. They diagnosed him with a yeast infection and started him on Clotrimazole Betamethasone cream yesterday and his itching has subsided but the rash still burns and is red. Denies CP/palp/SOB/HA/congestion/fevers/GI c/o. Taking meds as prescribed  Past Medical History:  Diagnosis Date  . Abnormal liver function 03/18/2013  . Adenoma of right adrenal gland 07/11/2002   2.4cm , noted on CT ABD  . Anxiety   . Arthritis of both knees 03/26/2016  . Astigmatism   . Atrial fibrillation ([place])    x1 successful cardioversions  . Back pain   . BCC (basal cell carcinoma of skin)    under right eye and right ear  . Cataract 09/29/2016  . Depression   . Diverticulitis   . Diverticulosis   . Fatty liver   . GERD (gastroesophageal reflux disease)   . Gout   . Hearing  loss of both ears 03/26/2016  . History of chronic prostatitis    started at age 52  . History of kidney stones   . Hyperglycemia 03/29/2016  . Hyperlipidemia   . Hypertension   . IBS (irritable bowel syndrome) 03/18/2013  . Incomplete right bundle branch block (RBBB)   . Internal hemorrhoids   . Kidney lesion 06/07/2015   Right midportion, 1.1x1.1 cm hyperechoic, noted on [place] ABD  . LAFB (left anterior fascicular block)   . Lipoma of axilla 09/2016  . Liver lesion, right lobe 06/07/2015   2.5x2.4x2.4 cm hypoechoic lesion posterior aspect, noted on [place] ABD  . Low back pain 03/26/2016  . LVH (left ventricular hypertrophy) 08/06/2017   Moderate, noted on ECHO  . Medicare annual wellness visit, subsequent 03/12/2014  . Muscle cramps   . Nocturia   . OA (osteoarthritis)    Back, Hands, Neck  . Obesity 11/25/2007   Qualifier: Diagnosis of  By: Lenna Gilford MD, Deborra Medina   . Other malaise and fatigue 03/13/2013  . Penis symptom or sign    PENIS RED NO DRAINAGE   . Premature ventricular complex   . Preventative health care 03/07/2015  . Renal insufficiency   . Scoliosis    Upper thoracic and lumbar  . Vitamin D deficiency    resolved    Past Surgical History:  Procedure Laterality Date  . CARDIOVERSION  07/31/2017  . CATARACT EXTRACTION, BILATERAL    . COLON SURGERY  2009   segmental sigmoid resection  . COLONOSCOPY    . CYSTOSCOPY WITH URETEROSCOPY AND STENT PLACEMENT Right 02/28/2018   Procedure: CYSTOSCOPY WITH URETEROSCOPY Concha Se;  Surgeon: Kathie Rhodes, MD;  Location: [facility];  Service: Urology;  Laterality: Right;  . CYSTOSCOPY/URETEROSCOPY/HOLMIUM LASER/STENT PLACEMENT Right 11/29/2017   Procedure: CYSTOSCOPY/RETROGRADE/URETEROSCOPY/HOLMIUM LASER/STENT PLACEMENT;  Surgeon: Kathie Rhodes, MD;  Location: [facility];  Service: Urology;  Laterality: Right;  . EYE SURGERY Bilateral 01/12/2017   cataract removal  . HOLMIUM LASER APPLICATION Right 09/05/537   Procedure:  HOLMIUM LASER APPLICATION;  Surgeon: Kathie Rhodes, MD;  Location: [facility];  Service: Urology;  Laterality: Right;  . IR BALLOON DILATION URETERAL STRICTURE RIGHT  03/14/2018  . IR NEPHRO TUBE REMOV/FL  03/17/2018  . IR NEPHROSTOGRAM RIGHT THRU EXISTING ACCESS  03/17/2018  . IR NEPHROSTOMY EXCHANGE RIGHT  03/14/2018  . IR NEPHROSTOMY PLACEMENT RIGHT  03/02/2018  . IR NEPHROSTOMY PLACEMENT RIGHT  04/20/2018  . IR URETERAL STENT PLACEMENT EXISTING ACCESS RIGHT  03/14/2018  . NOSE SURGERY     Submucous resection age 70  . NUCLEAR STRESS TEST  06/03/2009  . SKIN BIOPSY    . URETEROSCOPY WITH HOLMIUM LASER LITHOTRIPSY Right 09/24/2017   Procedure: CYSTOSCOPY, URETEROSCOPY WITH HOLMIUM LASER LITHOTRIPSY, STENT PLACEMENT;  Surgeon: Kathie Rhodes, MD;  Location: [facility];  Service: Urology;  Laterality: Right;    Family History  Problem Relation Age of Onset  . Heart disease Mother   . Hypertension Mother   . Stroke Mother   . Colon cancer Mother   . Breast cancer Mother   . Heart disease Father        pacemaker  . Aortic aneurysm Father   . Hypertension Father   . Heart disease Sister   . Atrial fibrillation Sister   . Obesity Sister   . Sleep apnea Sister   . Heart attack Brother   . Other Brother        muscle disease  . Arthritis Brother   . Stroke Brother   . Atrial fibrillation Brother   . Cancer Maternal Grandmother        ?  Marland Kitchen Heart attack Maternal Grandmother   . Diabetes Maternal Grandmother   . Cancer Maternal Grandfather        hodgin's lymphoma  . Heart attack Paternal Grandmother   . Anxiety disorder Paternal Grandmother   . Pneumonia Paternal Grandfather   . Heart attack Brother   . Atrial fibrillation Brother   . Atrial fibrillation Brother   . Heart attack Brother   . Hypertension Brother   . Hyperlipidemia Brother   . Heart attack Brother   . Other Brother        heart valve operation  . Atrial fibrillation Brother     Social  History   Socioeconomic History  . Marital status: Married    Spouse name: Not on file  . Number of children: 3  . Years of education: Not on file  . Highest education level: Not on file  Occupational History  . Occupation: Nurse, adult: [employer]  . Financial resource strain: Not on file  . Food insecurity:    Worry: Not on file    Inability: Not on file  . Transportation needs:    Medical: Not on file    Non-medical: Not on file  Tobacco Use  . Smoking status: Former Smoker    Packs/day: 1.00  Years: 6.00    Pack years: 6.00    Types: Cigarettes    Start date: 01/12/1969  . Smokeless tobacco: Never Used  . Tobacco comment: [id number]  Substance and Sexual Activity  . Alcohol use: Not Currently    Comment: NONE SINCE July 18 2017  . Drug use: No  . Sexual activity: Yes  Lifestyle  . Physical activity:    Days per week: Not on file    Minutes per session: Not on file  . Stress: Not on file  Relationships  . Social connections:    Talks on phone: Not on file    Gets together: Not on file    Attends religious service: Not on file    Active member of club or organization: Not on file    Attends meetings of clubs or organizations: Not on file    Relationship status: Not on file  . Intimate partner violence:    Fear of current or ex partner: Not on file    Emotionally abused: Not on file    Physically abused: Not on file    Forced sexual activity: Not on file  Other Topics Concern  . Not on file  Social History Narrative   The patient is married for the second time, he has 3 sons.   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   2 alcoholic beverages most days.   1 caffeinated beverage daily   No drug use no current tobacco use he is a prior smoker   12/24/2016    Outpatient Medications Prior to Visit  Medication Sig Dispense Refill  . acetaminophen (TYLENOL) 500 MG tablet Take 1,000 mg by mouth every 6 (six) hours as needed.    Marland Kitchen alum hydroxide-mag  trisilicate (GAVISCON) 58-09 MG CHEW Chew 1 tablet by mouth daily as needed for indigestion or heartburn.     Marland Kitchen atenolol (TENORMIN) 25 MG tablet TAKE ONE TABLET BY MOUTH TWICE A DAY (Patient taking differently: Take 25 mg by mouth 2 (two) times daily. ) 180 tablet 2  . bisacodyl (DULCOLAX) 5 MG EC tablet Take 5 mg by mouth daily as needed for moderate constipation.    . carboxymethylcellulose (REFRESH PLUS) 0.5 % SOLN Place 1 drop into both eyes daily.    . clotrimazole-betamethasone (LOTRISONE) cream Apply 1 application topically 2 (two) times daily. 30 g 0  . Colchicine 0.6 MG CAPS 1 tab po bid (Patient taking differently: Take 0.6 mg by mouth daily as needed (gout). 1 tab po bid) 60 capsule 0  . erythromycin ophthalmic ointment Place 1 application into both eyes 2 (two) times daily.    Marland Kitchen FLUoxetine (PROZAC) 10 MG tablet Take 0.5-1 tablets (5-10 mg total) by mouth daily. 30 tablet 3  . fluticasone (FLONASE) 50 MCG/ACT nasal spray Place 2 sprays into the nose as needed. For congestion    . HYDROcodone-acetaminophen (NORCO) 7.5-325 MG tablet Take 1-2 tablets by mouth every 4 (four) hours as needed for moderate pain. Maximum dose per 24 hours - 8 pills 20 tablet 0  . LORazepam (ATIVAN) 1 MG tablet Take 1 tablet (1 mg total) by mouth 2 (two) times daily as needed for anxiety or sedation. 180 tablet 1  . Menthol, Topical Analgesic, (ICY HOT BACK EX) Apply 1 application topically daily as needed (back pain).    Marland Kitchen omeprazole (PRILOSEC) 20 MG capsule Take 20 mg by mouth as needed.    . phenazopyridine (PYRIDIUM) 200 MG tablet Take 1 tablet (200 mg total) by  mouth 3 (three) times daily as needed for pain. 20 tablet 0  . phenazopyridine (PYRIDIUM) 200 MG tablet Take 1 tablet (200 mg total) by mouth 3 (three) times daily as needed for pain. 10 tablet 1  . Probiotic Product (PROBIOTIC DAILY PO) Take 1 capsule by mouth daily.     . tamsulosin (FLOMAX) 0.4 MG CAPS capsule Take 0.4 mg by mouth daily after supper.     . cyclobenzaprine (FLEXERIL) 5 MG tablet Take 1 tablet (5 mg total) by mouth 3 (three) times daily as needed for muscle spasms. (Patient not taking: Reported on 02/24/2018) 30 tablet 1  . nystatin cream (MYCOSTATIN) Apply 1 application topically 2 (two) times daily as needed for dry skin. 30 g 1  . sulfamethoxazole-trimethoprim (BACTRIM DS,SEPTRA DS) 800-160 MG tablet Take 1 tablet by mouth 2 (two) times daily. 14 tablet 0   No facility-administered medications prior to visit.     Allergies  Allergen Reactions  . Cefaclor Hives  . Cephalosporins Hives  . Penicillins Rash    Mild maculopapular rash Has patient had a PCN reaction causing immediate rash, facial/tongue/throat swelling, SOB or lightheadedness with hypotension: No Has patient had a PCN reaction causing severe rash involving mucus membranes or skin necrosis: No Has patient had a PCN reaction that required hospitalization: No Has patient had a PCN reaction occurring within the last 10 years: No If all of the above answers are "NO", then may proceed with Cephalosporin use.     Review of Systems  Constitutional: Negative for fever and malaise/fatigue.  HENT: Negative for congestion.   Eyes: Negative for blurred vision.  Respiratory: Negative for shortness of breath.   Cardiovascular: Negative for chest pain, palpitations and leg swelling.  Gastrointestinal: Negative for abdominal pain, blood in stool and nausea.  Genitourinary: Negative for dysuria and frequency.  Musculoskeletal: Negative for falls.  Skin: Positive for rash.  Neurological: Negative for dizziness, loss of consciousness and headaches.  Endo/Heme/Allergies: Negative for environmental allergies.  Psychiatric/Behavioral: Negative for depression. The patient is not nervous/anxious.        Objective:    Physical Exam Constitutional:      Appearance: Normal appearance. He is not ill-appearing.  HENT:     Head: Normocephalic and atraumatic.     Nose:  Nose normal.  Pulmonary:     Effort: Pulmonary effort is normal.  Skin:    General: Skin is dry.  Neurological:     Mental Status: He is alert and oriented to person, place, and time.  Psychiatric:        Mood and Affect: Mood normal.        Behavior: Behavior normal.     BP 116/67 (BP Location: Left Arm, Patient Position: Sitting, Cuff Size: Normal)   Ht 6' (1.829 m)   Wt 215 lb (97.5 kg)   BMI 29.16 kg/m  Wt Readings from Last 3 Encounters:  05/12/18 215 lb (97.5 kg)  04/20/18 219 lb (99.3 kg)  03/22/18 224 lb 12.8 oz (102 kg)    Diabetic Foot Exam - Simple   No data filed     Lab Results  Component Value Date   WBC 6.7 04/20/2018   HGB 12.3 (L) 04/20/2018   HCT 37.9 (L) 04/20/2018   PLT 203 04/20/2018   GLUCOSE 95 04/20/2018   CHOL 217 (H) 04/13/2018   TRIG 116.0 04/13/2018   HDL 33.20 (L) 04/13/2018   LDLDIRECT 168.5 03/29/2006   LDLCALC 160 (H) 04/13/2018   ALT 16 04/13/2018  AST 16 04/13/2018   NA 139 04/20/2018   K 4.2 04/20/2018   CL 106 04/20/2018   CREATININE 1.97 (H) 04/20/2018   BUN 21 04/20/2018   CO2 24 04/20/2018   TSH 3.05 10/08/2017   PSA 3.36 11/15/2017   INR 1.0 04/20/2018   HGBA1C 5.5 04/13/2018    Lab Results  Component Value Date   TSH 3.05 10/08/2017   Lab Results  Component Value Date   WBC 6.7 04/20/2018   HGB 12.3 (L) 04/20/2018   HCT 37.9 (L) 04/20/2018   MCV 93.8 04/20/2018   PLT 203 04/20/2018   Lab Results  Component Value Date   NA 139 04/20/2018   K 4.2 04/20/2018   CO2 24 04/20/2018   GLUCOSE 95 04/20/2018   BUN 21 04/20/2018   CREATININE 1.97 (H) 04/20/2018   BILITOT 0.9 04/13/2018   ALKPHOS 81 04/13/2018   AST 16 04/13/2018   ALT 16 04/13/2018   PROT 6.7 04/13/2018   ALBUMIN 4.3 04/13/2018   CALCIUM 9.8 04/20/2018   ANIONGAP 9 04/20/2018   GFR 43.85 (L) 04/13/2018   Lab Results  Component Value Date   CHOL 217 (H) 04/13/2018   Lab Results  Component Value Date   HDL 33.20 (L) 04/13/2018    Lab Results  Component Value Date   LDLCALC 160 (H) 04/13/2018   Lab Results  Component Value Date   TRIG 116.0 04/13/2018   Lab Results  Component Value Date   CHOLHDL 7 04/13/2018   Lab Results  Component Value Date   HGBA1C 5.5 04/13/2018       Assessment & Plan:   Problem List Items Addressed This Visit    Essential hypertension, benign    Well controlled, no changes to meds. Encouraged heart healthy diet such as the DASH diet and exercise as tolerated.       Renal insufficiency    Stabilized with stenting, continue to hydrate well      Skin rash    Likely candidal. Was started on Clotrimazole bid by urology add diflucan weekly x 2 doses      Hyperglycemia    hgba1c acceptable, minimize simple carbs. Increase exercise as tolerated.       Kidney stone    Has his next surgery 5/26 to manage his stents and once that is done they are hopeful they will have managed his stones appropriately         I have discontinued David [name] "Fred"'s nystatin cream and sulfamethoxazole-trimethoprim. I am also having him start on fluconazole. Additionally, I am having him maintain his fluticasone, alum hydroxide-mag trisilicate, atenolol, Colchicine, tamsulosin, carboxymethylcellulose, (Menthol, Topical Analgesic, (ICY HOT BACK EX)), Probiotic Product (PROBIOTIC DAILY PO), phenazopyridine, omeprazole, acetaminophen, erythromycin, bisacodyl, HYDROcodone-acetaminophen, LORazepam, phenazopyridine, FLUoxetine, clotrimazole-betamethasone, and cyclobenzaprine.  Meds ordered this encounter  Medications  . fluconazole (DIFLUCAN) 150 MG tablet    Sig: Take 1 tablet (150 mg total) by mouth once a week.    Dispense:  2 tablet    Refill:  0  . cyclobenzaprine (FLEXERIL) 5 MG tablet    Sig: Take 1 tablet (5 mg total) by mouth 3 (three) times daily as needed for muscle spasms.    Dispense:  30 tablet    Refill:  2   The patient was advised to call back or seek an in-person  evaluation if the symptoms worsen or if the condition fails to improve as anticipated.  I provided 25 minutes of non-face-to-face time during this encounter.  Penni Homans, MD

## 2018-05-16 ENCOUNTER — Encounter: Payer: Self-pay | Admitting: Family Medicine

## 2018-05-16 ENCOUNTER — Telehealth: Payer: Self-pay | Admitting: Family Medicine

## 2018-05-16 NOTE — Telephone Encounter (Signed)
Please advise 

## 2018-05-16 NOTE — Telephone Encounter (Signed)
Have him take an increased amount of Diflucan. Send in Diflucan 150 mg tabs, 2 tabs po daily x 3 days and then repeat again in 1 week, disp #6 that should help his rash if not he should do a visit

## 2018-05-16 NOTE — Telephone Encounter (Signed)
Copied from [place] [phone]. Topic: Quick Communication - See Telephone Encounter >> May 16, 2018  1:53 PM Rayann Heman wrote: CRM for notification. See Telephone encounter for: 05/16/18. Pt called and stated that had an OV on 05/12/18 for a skin rash. Pt states that he is not getting better and would like a call back from the provider or nurse for suggestions on what he should do. Pt states that he had discussed with urologist and he was unable to help. Please advise

## 2018-05-17 MED ORDER — FLUCONAZOLE 150 MG PO TABS: 150.0000 mg | ORAL_TABLET | Freq: Two times a day (BID) | ORAL | 0 refills | Status: DC

## 2018-05-17 NOTE — Telephone Encounter (Signed)
Spoke with patient sent medication in.

## 2018-05-20 ENCOUNTER — Telehealth: Payer: Self-pay | Admitting: Family Medicine

## 2018-05-20 ENCOUNTER — Telehealth: Payer: Self-pay

## 2018-05-20 MED ORDER — FLUCONAZOLE 150 MG PO TABS: 150.0000 mg | ORAL_TABLET | Freq: Two times a day (BID) | ORAL | 0 refills | Status: DC

## 2018-05-20 NOTE — Telephone Encounter (Signed)
I have sent medications in for patient

## 2018-05-20 NOTE — Telephone Encounter (Signed)
Copied from [place] [id number]. Topic: Quick Communication - See Telephone Encounter >> May 20, 2018  3:37 PM Berneta Levins wrote: CRM for notification. See Telephone encounter for: 05/20/18.  Pt calling with update:  states that he has a yeast infection on the head of his penis and it is was not responding to any treatment that was prescribed.  Pt states that it is finally not getting worse anymore and seems to be getting slightly better.  Pt states there is still bleeding, blisters on shaft of penis have almost gone away.  Pt states that it is only the meat of the penis that is red, inflamed and dripping blood.  Pt is trying to keep everything dry at this time. Pt can be reached at [phone]

## 2018-05-20 NOTE — Telephone Encounter (Signed)
Copied from [place] [phone]. Topic: General - Inquiry >> May 19, 2018  5:11 PM Yvette Rack wrote: Reason for CRM: Pt stated that his dermatologist confirmed that the correct medication for the yeast was prescribed and he would like more called in to his pharmacy. Pt requests a call back.

## 2018-05-22 ENCOUNTER — Encounter: Payer: Self-pay | Admitting: Family Medicine

## 2018-05-23 ENCOUNTER — Ambulatory Visit (INDEPENDENT_AMBULATORY_CARE_PROVIDER_SITE_OTHER): Payer: Medicare Other | Admitting: Internal Medicine

## 2018-05-23 ENCOUNTER — Other Ambulatory Visit: Payer: Self-pay

## 2018-05-23 ENCOUNTER — Encounter: Payer: Self-pay | Admitting: Internal Medicine

## 2018-05-23 VITALS — BP 142/72 | HR 64 | Temp 98.3°F | Resp 18 | Ht 72.0 in | Wt 223.0 lb

## 2018-05-23 DIAGNOSIS — N476 Balanoposthitis: Secondary | ICD-10-CM | POA: Diagnosis not present

## 2018-05-23 MED ORDER — FLUCONAZOLE 150 MG PO TABS: 150.0000 mg | ORAL_TABLET | Freq: Every day | ORAL | 0 refills | Status: DC

## 2018-05-23 NOTE — Telephone Encounter (Signed)
Copied from [place] [id number]. Topic: Quick Communication - See Telephone Encounter >> May 23, 2018  8:32 AM Vernona Rieger wrote: CRM for notification. See Telephone encounter for: 05/23/18.4 Patient would like an OV with Dr Charlett Blake as soon as possible. See mychart message from yesterday.

## 2018-05-23 NOTE — Patient Instructions (Signed)
Take Diflucan x 3 days  Avoid Colchicine while on Diflucan

## 2018-05-23 NOTE — Telephone Encounter (Signed)
Pt scheduled today. Done

## 2018-05-23 NOTE — Progress Notes (Signed)
Subjective:    Patient ID: David [name], male    DOB: 21-May-1945, 73 y.o.   MRN: [id number]  DOS:  05/23/2018 Type of visit - description: Acute visit (started as a virtual visit, I asked patient to come to the office)  He developed penile yeast infection, glans and foreskin were irritated and swollen, initially prescribed a cream by urology. Reports that the symptoms where irritation & raised bumps at the penis (blisters) On 05/12/2018 was seen at this office, he was not getting better, was prescribed Diflucan bid x 3.  He started to improve. Was seen by Dr. Ronnald Ramp, dermatology, approximately 05/18/2018, he agreed with the diagnosis prescribed a topical medication.   Last week, he developed bleeding at the shaft and under the foreskin, was seen by Jiles Crocker, a PA at urology, no other treatment was provided. He wonders if he needs additional fluconazole   Review of Systems Denies fever chills No cough No dysuria, gross hematuria, difficulty urinating Past Medical History:  Diagnosis Date  . Abnormal liver function 03/18/2013  . Adenoma of right adrenal gland 07/11/2002   2.4cm , noted on CT ABD  . Anxiety   . Arthritis of both knees 03/26/2016  . Astigmatism   . Atrial fibrillation ([place])    x1 successful cardioversions  . Back pain   . BCC (basal cell carcinoma of skin)    under right eye and right ear  . Cataract 09/29/2016  . Depression   . Diverticulitis   . Diverticulosis   . Fatty liver   . GERD (gastroesophageal reflux disease)   . Gout   . Hearing loss of both ears 03/26/2016  . History of chronic prostatitis    started at age 38  . History of kidney stones   . Hyperglycemia 03/29/2016  . Hyperlipidemia   . Hypertension   . IBS (irritable bowel syndrome) 03/18/2013  . Incomplete right bundle branch block (RBBB)   . Internal hemorrhoids   . Kidney lesion 06/07/2015   Right midportion, 1.1x1.1 cm hyperechoic, noted on [place] ABD  . LAFB (left anterior fascicular block)    . Lipoma of axilla 09/2016  . Liver lesion, right lobe 06/07/2015   2.5x2.4x2.4 cm hypoechoic lesion posterior aspect, noted on [place] ABD  . Low back pain 03/26/2016  . LVH (left ventricular hypertrophy) 08/06/2017   Moderate, noted on ECHO  . Medicare annual wellness visit, subsequent 03/12/2014  . Muscle cramps   . Nocturia   . OA (osteoarthritis)    Back, Hands, Neck  . Obesity 11/25/2007   Qualifier: Diagnosis of  By: Lenna Gilford MD, Deborra Medina   . Other malaise and fatigue 03/13/2013  . Penis symptom or sign    PENIS RED NO DRAINAGE   . Premature ventricular complex   . Preventative health care 03/07/2015  . Renal insufficiency   . Scoliosis    Upper thoracic and lumbar  . Vitamin D deficiency    resolved    Past Surgical History:  Procedure Laterality Date  . CARDIOVERSION  07/31/2017  . CATARACT EXTRACTION, BILATERAL    . COLON SURGERY  2009   segmental sigmoid resection  . COLONOSCOPY    . CYSTOSCOPY WITH URETEROSCOPY AND STENT PLACEMENT Right 02/28/2018   Procedure: CYSTOSCOPY WITH URETEROSCOPY [place] Se;  Surgeon: Kathie Rhodes, MD;  Location: [facility];  Service: Urology;  Laterality: Right;  . CYSTOSCOPY/URETEROSCOPY/HOLMIUM LASER/STENT PLACEMENT Right 11/29/2017   Procedure: CYSTOSCOPY/RETROGRADE/URETEROSCOPY/HOLMIUM LASER/STENT PLACEMENT;  Surgeon: Kathie Rhodes, MD;  Location: [facility];  Service: Urology;  Laterality: Right;  . EYE SURGERY Bilateral 01/12/2017   cataract removal  . HOLMIUM LASER APPLICATION Right 04/10/760   Procedure: HOLMIUM LASER APPLICATION;  Surgeon: Kathie Rhodes, MD;  Location: [facility];  Service: Urology;  Laterality: Right;  . IR BALLOON DILATION URETERAL STRICTURE RIGHT  03/14/2018  . IR NEPHRO TUBE REMOV/FL  03/17/2018  . IR NEPHROSTOGRAM RIGHT THRU EXISTING ACCESS  03/17/2018  . IR NEPHROSTOMY EXCHANGE RIGHT  03/14/2018  . IR NEPHROSTOMY PLACEMENT RIGHT  03/02/2018  . IR NEPHROSTOMY PLACEMENT RIGHT  04/20/2018  . IR  URETERAL STENT PLACEMENT EXISTING ACCESS RIGHT  03/14/2018  . NOSE SURGERY     Submucous resection age 68  . NUCLEAR STRESS TEST  06/03/2009  . SKIN BIOPSY    . URETEROSCOPY WITH HOLMIUM LASER LITHOTRIPSY Right 09/24/2017   Procedure: CYSTOSCOPY, URETEROSCOPY WITH HOLMIUM LASER LITHOTRIPSY, STENT PLACEMENT;  Surgeon: Kathie Rhodes, MD;  Location: [facility];  Service: Urology;  Laterality: Right;    Social History   Socioeconomic History  . Marital status: Married    Spouse name: Not on file  . Number of children: 3  . Years of education: Not on file  . Highest education level: Not on file  Occupational History  . Occupation: Nurse, adult: [employer]  . Financial resource strain: Not on file  . Food insecurity:    Worry: Not on file    Inability: Not on file  . Transportation needs:    Medical: Not on file    Non-medical: Not on file  Tobacco Use  . Smoking status: Former Smoker    Packs/day: 1.00    Years: 6.00    Pack years: 6.00    Types: Cigarettes    Start date: 01/12/1969  . Smokeless tobacco: Never Used  . Tobacco comment: [id number]  Substance and Sexual Activity  . Alcohol use: Not Currently    Comment: NONE SINCE July 18 2017  . Drug use: No  . Sexual activity: Yes  Lifestyle  . Physical activity:    Days per week: Not on file    Minutes per session: Not on file  . Stress: Not on file  Relationships  . Social connections:    Talks on phone: Not on file    Gets together: Not on file    Attends religious service: Not on file    Active member of club or organization: Not on file    Attends meetings of clubs or organizations: Not on file    Relationship status: Not on file  . Intimate partner violence:    Fear of current or ex partner: Not on file    Emotionally abused: Not on file    Physically abused: Not on file    Forced sexual activity: Not on file  Other Topics Concern  . Not on file  Social History Narrative   The patient  is married for the second time, he has 3 sons.   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   2 alcoholic beverages most days.   1 caffeinated beverage daily   No drug use no current tobacco use he is a prior smoker   12/24/2016      Allergies as of 05/23/2018      Reactions   Cefaclor Hives   Cephalosporins Hives   Penicillins Rash   Mild maculopapular rash Has patient had a PCN reaction causing immediate rash, facial/tongue/throat swelling, SOB  or lightheadedness with hypotension: No Has patient had a PCN reaction causing severe rash involving mucus membranes or skin necrosis: No Has patient had a PCN reaction that required hospitalization: No Has patient had a PCN reaction occurring within the last 10 years: No If all of the above answers are "NO", then may proceed with Cephalosporin use.      Medication List       Accurate as of May 23, 2018 11:59 PM. If you have any questions, ask your nurse or doctor.        STOP taking these medications   phenazopyridine 200 MG tablet Commonly known as:  Pyridium Stopped by:  Kathlene November, MD   tamsulosin 0.4 MG Caps capsule Commonly known as:  FLOMAX Stopped by:  Kathlene November, MD     TAKE these medications   acetaminophen 500 MG tablet Commonly known as:  TYLENOL Take 1,000 mg by mouth every 6 (six) hours as needed.   alum hydroxide-mag trisilicate 29-93 MG Chew chewable tablet Commonly known as:  GAVISCON Chew 1 tablet by mouth daily as needed for indigestion or heartburn.   atenolol 25 MG tablet Commonly known as:  TENORMIN TAKE ONE TABLET BY MOUTH TWICE A DAY   bisacodyl 5 MG EC tablet Commonly known as:  DULCOLAX Take 5 mg by mouth daily as needed for moderate constipation.   carboxymethylcellulose 0.5 % Soln Commonly known as:  REFRESH PLUS Place 1 drop into both eyes daily.   Colchicine 0.6 MG Caps 1 tab po bid What changed:    how much to take  how to take this  when to take this  reasons to take this    cyclobenzaprine 5 MG tablet Commonly known as:  FLEXERIL Take 1 tablet (5 mg total) by mouth 3 (three) times daily as needed for muscle spasms.   erythromycin ophthalmic ointment Place 1 application into both eyes 2 (two) times daily.   fluconazole 150 MG tablet Commonly known as:  DIFLUCAN Take 1 tablet (150 mg total) by mouth daily. What changed:  when to take this Changed by:  Kathlene November, MD   FLUoxetine 10 MG tablet Commonly known as:  PROZAC Take 0.5-1 tablets (5-10 mg total) by mouth daily.   fluticasone 50 MCG/ACT nasal spray Commonly known as:  FLONASE Place 2 sprays into the nose as needed. For congestion   HYDROcodone-acetaminophen 7.5-325 MG tablet Commonly known as:  NORCO Take 1-2 tablets by mouth every 4 (four) hours as needed for moderate pain. Maximum dose per 24 hours - 8 pills   ICY HOT BACK EX Apply 1 application topically daily as needed (back pain).   LORazepam 1 MG tablet Commonly known as:  ATIVAN Take 1 tablet (1 mg total) by mouth 2 (two) times daily as needed for anxiety or sedation.   Lotrisone cream Generic drug:  clotrimazole-betamethasone Apply 1 application topically 2 (two) times daily.   omeprazole 20 MG capsule Commonly known as:  PRILOSEC Take 20 mg by mouth as needed.   PROBIOTIC DAILY PO Take 1 capsule by mouth daily.           Objective:   Physical Exam BP (!) 142/72 (BP Location: Right Arm, Patient Position: Sitting, Cuff Size: Normal)   Pulse 64   Temp 98.3 F (36.8 C) (Oral)   Resp 18   Ht 6' (1.829 m)   Wt 223 lb (101.2 kg)   SpO2 98%   BMI 30.24 kg/m  General:   Well developed, NAD,  BMI noted.  HEENT:  Normocephalic . Face symmetric, atraumatic GU: The patient is uncircumcised, foreskin is normal.  The glans has multiple, 3 to 4 mm, different shaped erythematous rings with no blisters.  No current bleeding.  No swelling. Skin: Not pale. Not jaundice Neurologic:  alert & oriented X3.  Speech normal, gait  appropriate for age and unassisted Psych--  Cognition and judgment appear intact.  Cooperative with normal attention span and concentration.  Behavior appropriate. No anxious or depressed appearing.     Assessment      73 year old male PMH includes high cholesterol, HTN, atrial fibrillation , GERD, IBS, BCC, renal insufficiency, presents  with   Balanoposthitis: Has a history of balanoposthitis (reports that the foreskin was quite swollen and red), he is much better but request additional Diflucan. This is started as a virtual visit, however I asked patient to come to the office to be fully evaluated, I liked to be sure he did not have active blisters or cellulitis. He does not and at this point he has partially treated and improving balanitis. Plan: Continue topicals prescribed by dermatology, add 3 additional days of Diflucan 150 mg qd.  Prescription sent.  Call if not continue improving

## 2018-05-23 NOTE — Telephone Encounter (Signed)
Message sent to front to schedule virtual visit.

## 2018-05-23 NOTE — Telephone Encounter (Signed)
Needs virtual visit 

## 2018-05-25 ENCOUNTER — Other Ambulatory Visit: Payer: Self-pay | Admitting: Urology

## 2018-05-29 ENCOUNTER — Encounter: Payer: Self-pay | Admitting: Family Medicine

## 2018-05-30 ENCOUNTER — Other Ambulatory Visit: Payer: Self-pay | Admitting: Family Medicine

## 2018-05-30 MED ORDER — FLUOXETINE HCL 10 MG PO TABS: 5.0000 mg | ORAL_TABLET | Freq: Two times a day (BID) | ORAL | 3 refills | Status: DC

## 2018-05-31 NOTE — Progress Notes (Signed)
SPOKE W/Pt _     SCREENING SYMPTOMS OF COVID 19:   COUGH--No  RUNNY NOSE--- No  SORE THROAT---No  NASAL CONGESTION----No  SNEEZING----No  SHORTNESS OF BREATH---No  DIFFICULTY BREATHING---No  TEMP >100.0 -----No  UNEXPLAINED BODY ACHES------No  CHILLS --------No   HEADACHES ---------No  LOSS OF SMELL/ TASTE --------No    HAVE YOU OR ANY FAMILY MEMBER TRAVELLED PAST 14 DAYS OUT OF THE   COUNTY---No STATE----No COUNTRY---No-  HAVE YOU OR ANY FAMILY MEMBER BEEN EXPOSED TO ANYONE WITH COVID 19? No    

## 2018-05-31 NOTE — Patient Instructions (Addendum)
ANITA MCADORY  05/31/2018   Your procedure is scheduled on: 06-07-18    Report to [facility] Main  Entrance    Report to Admitting at 5:30 AM   YOU NEED TO HAVE A COVID 19 TEST ON 06-03-18, THIS TEST MUST BE DONE BEFORE SURGERY, COME TO [place] ENTRANCE BETWEEN THE HOURS OF 900 AM AND 300 PM ON YOUR COVID TEST DATE.   Call this number if you have problems the morning of surgery [phone]    Remember: Do not eat food or drink liquids :After Midnight.    BRUSH YOUR TEETH MORNING OF SURGERY AND RINSE YOUR MOUTH OUT, NO CHEWING GUM CANDY OR MINTS.     Take these medicines the morning of surgery with A SIP OF WATER: Atenolol (Tenormin),  Fluoxetine, Lorazepam (Ativan),  Omeprazole (Prilosec) and Colchicine prn. You may also use and bring your nasal spray.                                 You may not have any metal on your body including hair pins and              piercings     Do not wear jewelry, cologne, lotions, powders or deodorant                         Men may shave face and neck.   Do not bring valuables to the hospital. [place].  Contacts, dentures or bridgework may not be worn into surgery.  :  Special Instructions: Follow a Clear Liquid Diet along with your prep, per your surgeon's instructions    CLEAR LIQUID DIET   Foods Allowed                                                                     Foods Excluded  Coffee and tea, regular and decaf                             liquids that you cannot  Plain Jell-O in any flavor                                             see through such as: Fruit ices (not with fruit pulp)                                     milk, soups, orange juice  Iced Popsicles                                    All solid food Carbonated beverages, regular and diet  Cranberry, grape and apple  juices Sports drinks like Gatorade Lightly seasoned clear broth or consume(fat free) Sugar, honey syrup  Sample Menu Breakfast                                Lunch                                     Supper Cranberry juice                    Beef broth                            Chicken broth Jell-O                                     Grape juice                           Apple juice Coffee or tea                        Jell-O                                      Popsicle                                                Coffee or tea                        Coffee or tea                  Please read over the following fact sheets you were given: _____________________________________________________________________             [facility] - Preparing for Surgery Before surgery, you can play an important role.  Because skin is not sterile, your skin needs to be as free of germs as possible.  You can reduce the number of germs on your skin by washing with CHG (chlorahexidine gluconate) soap before surgery.  CHG is an antiseptic cleaner which kills germs and bonds with the skin to continue killing germs even after washing. Please DO NOT use if you have an allergy to CHG or antibacterial soaps.  If your skin becomes reddened/irritated stop using the CHG and inform your nurse when you arrive at Short Stay. Do not shave (including legs and underarms) for at least 48 hours prior to the first CHG shower.  You may shave your face/neck. Please follow these instructions carefully:  1.  Shower with CHG Soap the night before surgery and the  morning of Surgery.  2.  If you choose to wash your hair, wash your hair first as usual with your  normal  shampoo.  3.  After you shampoo, rinse your hair and body thoroughly to remove the  shampoo.  4.  Use CHG as you would any other liquid soap.  You can apply chg directly  to the skin and wash                       Gently with a scrungie  or clean washcloth.  5.  Apply the CHG Soap to your body ONLY FROM THE NECK DOWN.   Do not use on face/ open                           Wound or open sores. Avoid contact with eyes, ears mouth and genitals (private parts).                       Wash face,  Genitals (private parts) with your normal soap.             6.  Wash thoroughly, paying special attention to the area where your surgery  will be performed.  7.  Thoroughly rinse your body with warm water from the neck down.  8.  DO NOT shower/wash with your normal soap after using and rinsing off  the CHG Soap.                9.  Pat yourself dry with a clean towel.            10.  Wear clean pajamas.            11.  Place clean sheets on your bed the night of your first shower and do not  sleep with pets. Day of Surgery : Do not apply any lotions/deodorants the morning of surgery.  Please wear clean clothes to the hospital/surgery center.  FAILURE TO FOLLOW THESE INSTRUCTIONS MAY RESULT IN THE CANCELLATION OF YOUR SURGERY PATIENT SIGNATURE_________________________________  NURSE SIGNATURE__________________________________  ________________________________________________________________________

## 2018-05-31 NOTE — Progress Notes (Signed)
07-27-17 (Epic) EKG, ECHO  07-31-17 (Epic) CXR  10-1/19 (Epic) LOV w/Cardiologist. Recommendations to f/u in 6 months

## 2018-06-01 ENCOUNTER — Encounter (HOSPITAL_COMMUNITY)
Admission: RE | Admit: 2018-06-01 | Discharge: 2018-06-01 | Disposition: A | Discharge status: Home / Self Care | Payer: Medicare Other | Source: Ambulatory Visit | Attending: Urology | Admitting: Urology

## 2018-06-01 ENCOUNTER — Other Ambulatory Visit: Payer: Self-pay

## 2018-06-01 ENCOUNTER — Encounter (HOSPITAL_COMMUNITY): Payer: Self-pay

## 2018-06-01 DIAGNOSIS — Z1159 Encounter for screening for other viral diseases: Secondary | ICD-10-CM | POA: Diagnosis not present

## 2018-06-01 DIAGNOSIS — Z01812 Encounter for preprocedural laboratory examination: Secondary | ICD-10-CM | POA: Diagnosis present

## 2018-06-01 DIAGNOSIS — N131 Hydronephrosis with ureteral stricture, not elsewhere classified: Secondary | ICD-10-CM | POA: Diagnosis not present

## 2018-06-01 LAB — BASIC METABOLIC PANEL
Anion gap: 8 (ref 5–15)
BUN: 17 mg/dL (ref 8–23)
CO2: 25 mmol/L (ref 22–32)
Calcium: 10 mg/dL (ref 8.9–10.3)
Chloride: 104 mmol/L (ref 98–111)
Creatinine, Ser: 1.53 mg/dL — ABNORMAL HIGH (ref 0.61–1.24)
GFR calc Af Amer: 52 mL/min — ABNORMAL LOW (ref >60)
GFR calc non Af Amer: 44 mL/min — ABNORMAL LOW (ref >60)
Glucose, Bld: 98 mg/dL (ref 70–99)
Potassium: 4.1 mmol/L (ref 3.5–5.1)
Sodium: 137 mmol/L (ref 135–145)

## 2018-06-01 LAB — CBC
HCT: 42.1 % (ref 39.0–52.0)
Hemoglobin: 13.5 g/dL (ref 13.0–17.0)
MCH: 29.8 pg (ref 26.0–34.0)
MCHC: 32.1 g/dL (ref 30.0–36.0)
MCV: 92.9 fL (ref 80.0–100.0)
Platelets: 205 10*3/uL (ref 150–400)
RBC: 4.53 MIL/uL (ref 4.22–5.81)
RDW: 14.6 % (ref 11.5–15.5)
WBC: 5.5 10*3/uL (ref 4.0–10.5)
nRBC: 0 % (ref 0.0–0.2)

## 2018-06-02 NOTE — Anesthesia Preprocedure Evaluation (Addendum)
Anesthesia Evaluation  Patient identified by MRN, date of birth, ID band Patient awake    Reviewed: Allergy & Precautions, NPO status , Patient's Chart, lab work & pertinent test results  Airway Mallampati: I  TM Distance: >3 FB Neck ROM: Full    Dental no notable dental hx. (+) Teeth Intact, Dental Advisory Given   Pulmonary neg pulmonary ROS, former smoker,    Pulmonary exam normal breath sounds clear to auscultation       Cardiovascular hypertension, Normal cardiovascular exam+ dysrhythmias (on xarelto, not taking since Sept 2019) Atrial Fibrillation  Rhythm:Regular Rate:Normal  EKG: 08/06/2017 Rate 59 bpm Sinus bradycardia Incomplete right bundle branch block Left anterior fascicular block Abnormal ECG No significant change since last tracing  CV: Echo 08/06/17 - Left ventricle: The cavity size was normal. Wall thickness was increased in a pattern of moderate LVH. Systolic function was normal. The estimated ejection fraction was in the range of 60%to 65%. Left ventricular diastolic function parameters were normal. - Aortic valve: There was mild regurgitation. - Right ventricle: The cavity size was mildly dilated. - Atrial septum: No defect or patent foramen ovale was identified.  Stress Test 08/11/2005 Impression: Insignificant upsloping ST segment depression.  Normal stress nuclear study.    Neuro/Psych PSYCHIATRIC DISORDERS Anxiety Depression negative neurological ROS     GI/Hepatic Neg liver ROS, GERD  Controlled,  Endo/Other  negative endocrine ROS  Renal/GU Renal InsufficiencyRenal disease  negative genitourinary   Musculoskeletal  (+) Arthritis , Osteoarthritis,    Abdominal   Peds negative pediatric ROS (+)  Hematology negative hematology ROS (+)   Anesthesia Other Findings Right ureteral stricture  Reproductive/Obstetrics negative OB ROS                            Anesthesia Physical Anesthesia Plan  ASA: III  Anesthesia Plan: General   Post-op Pain Management:    Induction: Intravenous  PONV Risk Score and Plan: 2 and Ondansetron and Dexamethasone  Airway Management Planned: Oral ETT  Additional Equipment:   Intra-op Plan:   Post-operative Plan: Extubation in OR  Informed Consent: I have reviewed the patients History and Physical, chart, labs and discussed the procedure including the risks, benefits and alternatives for the proposed anesthesia with the patient or authorized representative who has indicated his/her understanding and acceptance.     Dental advisory given  Plan Discussed with: CRNA  Anesthesia Plan Comments:      Anesthesia Quick Evaluation

## 2018-06-02 NOTE — Progress Notes (Signed)
Anesthesia Chart Review   Case:  [id number] Date/Time:  06/07/18 0700   Procedures:      XI ROBOTIC ASSISTED PYELOPLASTY WITH STENT PLACEMENT (Right ) - 3 HRS     CYSTOSCOPY WITH RETROGRADE PYELOGRAM (Right )   Anesthesia type:  General   Pre-op diagnosis:  RIGHT URETERAL STRICTURE   Location:  [facility] ROOM 03 / [facility]   Surgeon:  Cleon Gustin, MD      DISCUSSION: 73 yo former smoker (6 pack years) with h/o anxiety, depression, HLD, PVCs, renal insufficiency (creatinine 1.53, stable), RBBB, GERD, A-fib (s/p cardioversion, Xarelto held due to ongoing hematuria), HTN, right ureteral stricture scheduled for above procedure 06/07/18 with Dr. Nicolette Bang.   Pt last seen by PCP, Dr. Penni Homans, 05/12/2018 via telemedicine.  Stable at this visit.    Pt last seen by cardiologist, Dr. Minus Breeding, 10/12/17.  Pt stable at this visit.  Will restart Xarelto when cleared by urology due to ongoing hematuria.    Pt can proceed with planned procedure barring acute status change.  VS: BP (!) 147/80 (BP Location: Right Arm)   Pulse 64   Temp 36.8 C (Oral)   Resp 18   Ht 6' (1.829 m)   Wt 99.8 kg   SpO2 98%   BMI 29.85 kg/m   PROVIDERS: Mosie Lukes, MD is PCP   Minus Breeding, MD is Cardiologist  LABS: Labs reviewed: Acceptable for surgery. (all labs ordered are listed, but only abnormal results are displayed)  Labs Reviewed  BASIC METABOLIC PANEL - Abnormal; Notable for the following components:      Result Value   Creatinine, Ser 1.53 (*)    GFR calc non Af Amer 44 (*)    GFR calc Af Amer 52 (*)    All other components within normal limits  CBC     IMAGES:   EKG: 08/06/2017 Rate 59 bpm Sinus bradycardia Incomplete right bundle branch block Left anterior fascicular block Abnormal ECG No significant change since last tracing  CV: Echo 08/06/17 Study Conclusions  - Left ventricle: The cavity size was normal. Wall thickness was   increased in a pattern of  moderate LVH. Systolic function was   normal. The estimated ejection fraction was in the range of 60%   to 65%. Left ventricular diastolic function parameters were   normal. - Aortic valve: There was mild regurgitation. - Right ventricle: The cavity size was mildly dilated. - Atrial septum: No defect or patent foramen ovale was identified.  Stress Test 08/11/2005 Impression: Insignificant upsloping ST segment depression.  Normal stress nuclear study.  Past Medical History:  Diagnosis Date  . Abnormal liver function 03/18/2013  . Adenoma of right adrenal gland 07/11/2002   2.4cm , noted on CT ABD  . Anxiety   . Arthritis of both knees 03/26/2016  . Astigmatism   . Atrial fibrillation ([place])    x1 successful cardioversions  . Back pain   . BCC (basal cell carcinoma of skin)    under right eye and right ear  . Cataract 09/29/2016  . Depression   . Diverticulitis   . Diverticulosis   . Fatty liver   . GERD (gastroesophageal reflux disease)   . Gout   . Hearing loss of both ears 03/26/2016  . History of chronic prostatitis    started at age 66  . History of kidney stones   . Hyperglycemia 03/29/2016  . Hyperlipidemia   . Hypertension   . IBS (irritable  bowel syndrome) 03/18/2013  . Incomplete right bundle branch block (RBBB)   . Internal hemorrhoids   . Kidney lesion 06/07/2015   Right midportion, 1.1x1.1 cm hyperechoic, noted on [place] ABD  . LAFB (left anterior fascicular block)   . Lipoma of axilla 09/2016  . Liver lesion, right lobe 06/07/2015   2.5x2.4x2.4 cm hypoechoic lesion posterior aspect, noted on [place] ABD  . Low back pain 03/26/2016  . LVH (left ventricular hypertrophy) 08/06/2017   Moderate, noted on ECHO  . Medicare annual wellness visit, subsequent 03/12/2014  . Muscle cramps   . Nocturia   . OA (osteoarthritis)    Back, Hands, Neck  . Obesity 11/25/2007   Qualifier: Diagnosis of  By: Lenna Gilford MD, Deborra Medina   . Other malaise and fatigue 03/13/2013  . Penis symptom or sign     PENIS RED NO DRAINAGE   . Premature ventricular complex   . Preventative health care 03/07/2015  . Renal insufficiency   . Scoliosis    Upper thoracic and lumbar  . Vitamin D deficiency    resolved    Past Surgical History:  Procedure Laterality Date  . CARDIOVERSION  07/31/2017  . CATARACT EXTRACTION, BILATERAL    . COLON SURGERY  2009   segmental sigmoid resection  . COLONOSCOPY    . CYSTOSCOPY WITH URETEROSCOPY AND STENT PLACEMENT Right 02/28/2018   Procedure: CYSTOSCOPY WITH URETEROSCOPY Concha Se;  Surgeon: Kathie Rhodes, MD;  Location: [facility];  Service: Urology;  Laterality: Right;  . CYSTOSCOPY/URETEROSCOPY/HOLMIUM LASER/STENT PLACEMENT Right 11/29/2017   Procedure: CYSTOSCOPY/RETROGRADE/URETEROSCOPY/HOLMIUM LASER/STENT PLACEMENT;  Surgeon: Kathie Rhodes, MD;  [facility];  Service: Urology;  Laterality: Right;  . EYE SURGERY Bilateral 01/12/2017   cataract removal  . HOLMIUM LASER APPLICATION Right 9/39/0300   Procedure: HOLMIUM LASER APPLICATION;  Surgeon: Kathie Rhodes, MD;  Location: [facility];  Service: Urology;  Laterality: Right;  . IR BALLOON DILATION URETERAL STRICTURE RIGHT  03/14/2018  . IR NEPHRO TUBE REMOV/FL  03/17/2018  . IR NEPHROSTOGRAM RIGHT THRU EXISTING ACCESS  03/17/2018  . IR NEPHROSTOMY EXCHANGE RIGHT  03/14/2018  . IR NEPHROSTOMY PLACEMENT RIGHT  03/02/2018  . IR NEPHROSTOMY PLACEMENT RIGHT  04/20/2018  . IR URETERAL STENT PLACEMENT EXISTING ACCESS RIGHT  03/14/2018  . NOSE SURGERY     Submucous resection age 59  . NUCLEAR STRESS TEST  06/03/2009  . SKIN BIOPSY    . URETEROSCOPY WITH HOLMIUM LASER LITHOTRIPSY Right 09/24/2017   Procedure: CYSTOSCOPY, URETEROSCOPY WITH HOLMIUM LASER LITHOTRIPSY, STENT PLACEMENT;  Surgeon: Kathie Rhodes, MD;  Location: [facility];  Service: Urology;  Laterality: Right;    MEDICATIONS: . acetaminophen (TYLENOL) 500 MG tablet  . atenolol (TENORMIN) 25 MG tablet  .  bisacodyl (DULCOLAX) 5 MG EC tablet  . carboxymethylcellulose (REFRESH PLUS) 0.5 % SOLN  . clotrimazole-betamethasone (LOTRISONE) cream  . Colchicine 0.6 MG CAPS  . cyclobenzaprine (FLEXERIL) 5 MG tablet  . fluconazole (DIFLUCAN) 150 MG tablet  . FLUoxetine (PROZAC) 10 MG tablet  . fluticasone (FLONASE) 50 MCG/ACT nasal spray  . LORazepam (ATIVAN) 1 MG tablet  . Menthol, Topical Analgesic, (ICY HOT BACK EX)  . omeprazole (PRILOSEC) 20 MG capsule  . Probiotic Product (PROBIOTIC DAILY PO)   No current facility-administered medications for this encounter.     Maia Plan [facility] Pre-Surgical Testing [phone] 06/02/18 11:56 AM

## 2018-06-03 ENCOUNTER — Encounter: Payer: Self-pay | Admitting: Family Medicine

## 2018-06-03 ENCOUNTER — Ambulatory Visit (INDEPENDENT_AMBULATORY_CARE_PROVIDER_SITE_OTHER): Payer: Medicare Other | Admitting: Family Medicine

## 2018-06-03 ENCOUNTER — Other Ambulatory Visit (HOSPITAL_COMMUNITY)
Admission: RE | Admit: 2018-06-03 | Discharge: 2018-06-03 | Disposition: A | Discharge status: Home / Self Care | Payer: Medicare Other | Source: Ambulatory Visit | Attending: Urology | Admitting: Urology

## 2018-06-03 DIAGNOSIS — M109 Gout, unspecified: Secondary | ICD-10-CM

## 2018-06-03 DIAGNOSIS — Z01812 Encounter for preprocedural laboratory examination: Secondary | ICD-10-CM | POA: Diagnosis not present

## 2018-06-03 MED ORDER — COLCHICINE 0.6 MG PO CAPS: ORAL_CAPSULE | ORAL | 0 refills | Status: DC

## 2018-06-03 NOTE — Progress Notes (Signed)
Virtual Visit via Video Note  I connected with David [name] on 06/03/18 at  8:45 AM EDT by a video enabled telemedicine application and verified that I am speaking with the correct person using two identifiers.  Location: Patient: home  Provider: office   I discussed the limitations of evaluation and management by telemedicine and the availability of in person appointments. The patient expressed understanding and agreed to proceed.  History of Present Illness: Pt woke up this am with pain in his L big toe   He does have a hx of gout but his feels different  He had frosted flakes and chicken 3 days in a row. L toe is swollen and red.  He is scheduled to have a bladder procedure on Tuesday      Observations/Objective: Unable to get vitals Pt in NAD L toe slightly swollen  Painful to touch   Assessment and Plan: 1. Gouty arthritis of left great toe Pt will let urologist know  He will call if symptoms do not improve or worsen  - Colchicine 0.6 MG CAPS; 1 tab po bid  Dispense: 60 capsule; Refill: 0   Follow Up Instructions:    I discussed the assessment and treatment plan with the patient. The patient was provided an opportunity to ask questions and all were answered. The patient agreed with the plan and demonstrated an understanding of the instructions.   The patient was advised to call back or seek an in-person evaluation if the symptoms worsen or if the condition fails to improve as anticipated.  I provided 15 minutes of non-face-to-face time during this encounter.   Ann Held, DO

## 2018-06-04 LAB — NOVEL CORONAVIRUS, NAA (HOSP ORDER, SEND-OUT TO REF LAB; TAT 18-24 HRS): SARS-CoV-2, NAA: NOT DETECTED

## 2018-06-06 NOTE — Progress Notes (Signed)
SPOKE W/  _patient     SCREENING SYMPTOMS OF COVID 19:   COUGH--no  RUNNY NOSE--- no  SORE THROAT---no  NASAL CONGESTION----no  SNEEZING----no  SHORTNESS OF BREATH---no  DIFFICULTY BREATHING---no  TEMP >100.0 -----no  UNEXPLAINED BODY ACHES------no  CHILLS --------no   HEADACHES ---------no  LOSS OF SMELL/ TASTE --------no    HAVE YOU OR ANY FAMILY MEMBER TRAVELLED PAST 14 DAYS OUT OF THE   COUNTY---no STATE----no COUNTRY----no  HAVE YOU OR ANY FAMILY MEMBER BEEN EXPOSED TO ANYONE WITH COVID 19? no    

## 2018-06-07 ENCOUNTER — Observation Stay (HOSPITAL_COMMUNITY)
Admission: RE | Admit: 2018-06-07 | Discharge: 2018-06-09 | Disposition: A | Discharge status: Home / Self Care | Payer: Medicare Other | Attending: Urology | Admitting: Urology

## 2018-06-07 ENCOUNTER — Inpatient Hospital Stay (HOSPITAL_COMMUNITY): Payer: Medicare Other

## 2018-06-07 ENCOUNTER — Inpatient Hospital Stay (HOSPITAL_COMMUNITY): Payer: Medicare Other | Admitting: Physician Assistant

## 2018-06-07 ENCOUNTER — Encounter (HOSPITAL_COMMUNITY): Payer: Self-pay

## 2018-06-07 ENCOUNTER — Encounter (HOSPITAL_COMMUNITY)
Admission: RE | Disposition: A | Discharge status: Home / Self Care | Payer: Self-pay | Source: Home / Self Care | Attending: Urology

## 2018-06-07 ENCOUNTER — Inpatient Hospital Stay (HOSPITAL_COMMUNITY): Payer: Medicare Other | Admitting: Certified Registered Nurse Anesthetist

## 2018-06-07 ENCOUNTER — Other Ambulatory Visit: Payer: Self-pay

## 2018-06-07 DIAGNOSIS — F419 Anxiety disorder, unspecified: Secondary | ICD-10-CM | POA: Diagnosis not present

## 2018-06-07 DIAGNOSIS — Z7901 Long term (current) use of anticoagulants: Secondary | ICD-10-CM | POA: Insufficient documentation

## 2018-06-07 DIAGNOSIS — Z85828 Personal history of other malignant neoplasm of skin: Secondary | ICD-10-CM | POA: Diagnosis not present

## 2018-06-07 DIAGNOSIS — Z87442 Personal history of urinary calculi: Secondary | ICD-10-CM | POA: Diagnosis not present

## 2018-06-07 DIAGNOSIS — N135 Crossing vessel and stricture of ureter without hydronephrosis: Secondary | ICD-10-CM | POA: Diagnosis present

## 2018-06-07 DIAGNOSIS — I4891 Unspecified atrial fibrillation: Secondary | ICD-10-CM | POA: Insufficient documentation

## 2018-06-07 DIAGNOSIS — K219 Gastro-esophageal reflux disease without esophagitis: Secondary | ICD-10-CM | POA: Diagnosis not present

## 2018-06-07 DIAGNOSIS — K660 Peritoneal adhesions (postprocedural) (postinfection): Secondary | ICD-10-CM | POA: Insufficient documentation

## 2018-06-07 DIAGNOSIS — Z6829 Body mass index (BMI) 29.0-29.9, adult: Secondary | ICD-10-CM | POA: Insufficient documentation

## 2018-06-07 DIAGNOSIS — Z87891 Personal history of nicotine dependence: Secondary | ICD-10-CM | POA: Insufficient documentation

## 2018-06-07 DIAGNOSIS — F329 Major depressive disorder, single episode, unspecified: Secondary | ICD-10-CM | POA: Insufficient documentation

## 2018-06-07 DIAGNOSIS — Z79899 Other long term (current) drug therapy: Secondary | ICD-10-CM | POA: Insufficient documentation

## 2018-06-07 DIAGNOSIS — E669 Obesity, unspecified: Secondary | ICD-10-CM | POA: Diagnosis not present

## 2018-06-07 DIAGNOSIS — I10 Essential (primary) hypertension: Secondary | ICD-10-CM | POA: Diagnosis not present

## 2018-06-07 HISTORY — PX: CYSTOSCOPY W/ RETROGRADES: SHX1426

## 2018-06-07 HISTORY — PX: ROBOT ASSISTED PYELOPLASTY: SHX5143

## 2018-06-07 LAB — HEMOGLOBIN AND HEMATOCRIT, BLOOD
HCT: 43.2 % (ref 39.0–52.0)
Hemoglobin: 13.4 g/dL (ref 13.0–17.0)

## 2018-06-07 SURGERY — XI ROBOTIC ASSISTED PYELOPLASTY WITH STENT PLACEMENT
Anesthesia: General | Laterality: Right

## 2018-06-07 MED ORDER — LACTATED RINGERS IR SOLN
Status: DC | PRN
  Administered 2018-06-07: 1000 mL

## 2018-06-07 MED ORDER — GLYCOPYRROLATE PF 0.2 MG/ML IJ SOSY
PREFILLED_SYRINGE | INTRAMUSCULAR | Status: DC | PRN
  Administered 2018-06-07: .2 mg via INTRAVENOUS

## 2018-06-07 MED ORDER — LIDOCAINE 2% (20 MG/ML) 5 ML SYRINGE
INTRAMUSCULAR | Status: AC
  Filled 2018-06-07: qty 5

## 2018-06-07 MED ORDER — ONDANSETRON HCL 4 MG/2ML IJ SOLN
INTRAMUSCULAR | Status: AC
  Filled 2018-06-07: qty 2

## 2018-06-07 MED ORDER — WATER FOR IRRIGATION, STERILE IR SOLN
Status: DC | PRN
  Administered 2018-06-07: 3000 mL

## 2018-06-07 MED ORDER — DIPHENHYDRAMINE HCL 12.5 MG/5ML PO ELIX: 12.5000 mg | ORAL_SOLUTION | Freq: Four times a day (QID) | ORAL | Status: DC | PRN

## 2018-06-07 MED ORDER — BUPIVACAINE-EPINEPHRINE (PF) 0.25% -1:200000 IJ SOLN
INTRAMUSCULAR | Status: AC
  Filled 2018-06-07: qty 30

## 2018-06-07 MED ORDER — MAGNESIUM CITRATE PO SOLN: 1.0000 | Freq: Once | ORAL | Status: DC

## 2018-06-07 MED ORDER — HYDROCODONE-ACETAMINOPHEN 5-325 MG PO TABS: 1.0000 | ORAL_TABLET | Freq: Four times a day (QID) | ORAL | 0 refills | Status: DC | PRN

## 2018-06-07 MED ORDER — DOCUSATE SODIUM 100 MG PO CAPS
100.0000 mg | ORAL_CAPSULE | Freq: Two times a day (BID) | ORAL | Status: DC
  Administered 2018-06-07 – 2018-06-09 (×4): 100 mg via ORAL
  Filled 2018-06-07 (×4): qty 1

## 2018-06-07 MED ORDER — FENTANYL CITRATE (PF) 250 MCG/5ML IJ SOLN
INTRAMUSCULAR | Status: AC
  Filled 2018-06-07: qty 5

## 2018-06-07 MED ORDER — DEXAMETHASONE SODIUM PHOSPHATE 10 MG/ML IJ SOLN
INTRAMUSCULAR | Status: DC | PRN
  Administered 2018-06-07: 8 mg via INTRAVENOUS

## 2018-06-07 MED ORDER — ROCURONIUM BROMIDE 50 MG/5ML IV SOSY
PREFILLED_SYRINGE | INTRAVENOUS | Status: DC | PRN
  Administered 2018-06-07: 10 mg via INTRAVENOUS
  Administered 2018-06-07: 20 mg via INTRAVENOUS
  Administered 2018-06-07: 10 mg via INTRAVENOUS
  Administered 2018-06-07: 50 mg via INTRAVENOUS
  Administered 2018-06-07: 10 mg via INTRAVENOUS

## 2018-06-07 MED ORDER — ONDANSETRON HCL 4 MG/2ML IJ SOLN: 4.0000 mg | INTRAMUSCULAR | Status: DC | PRN

## 2018-06-07 MED ORDER — SODIUM CHLORIDE (PF) 0.9 % IJ SOLN
INTRAMUSCULAR | Status: AC
  Filled 2018-06-07: qty 20

## 2018-06-07 MED ORDER — LIDOCAINE 2% (20 MG/ML) 5 ML SYRINGE
INTRAMUSCULAR | Status: DC | PRN
  Administered 2018-06-07: 100 mg via INTRAVENOUS

## 2018-06-07 MED ORDER — FLUOXETINE HCL 10 MG PO TABS: 5.0000 mg | ORAL_TABLET | Freq: Two times a day (BID) | ORAL | Status: DC

## 2018-06-07 MED ORDER — GLYCOPYRROLATE PF 0.2 MG/ML IJ SOSY
PREFILLED_SYRINGE | INTRAMUSCULAR | Status: AC
  Filled 2018-06-07: qty 1

## 2018-06-07 MED ORDER — MIDAZOLAM HCL 2 MG/2ML IJ SOLN
INTRAMUSCULAR | Status: AC
  Filled 2018-06-07: qty 2

## 2018-06-07 MED ORDER — ACETAMINOPHEN 325 MG PO TABS
650.0000 mg | ORAL_TABLET | ORAL | Status: DC | PRN
  Administered 2018-06-07 – 2018-06-08 (×3): 650 mg via ORAL
  Filled 2018-06-07 (×3): qty 2

## 2018-06-07 MED ORDER — BUPIVACAINE LIPOSOME 1.3 % IJ SUSP
20.0000 mL | Freq: Once | INTRAMUSCULAR | Status: AC
  Administered 2018-06-07: 20 mL
  Filled 2018-06-07: qty 20

## 2018-06-07 MED ORDER — SUCCINYLCHOLINE CHLORIDE 200 MG/10ML IV SOSY
PREFILLED_SYRINGE | INTRAVENOUS | Status: AC
  Filled 2018-06-07: qty 10

## 2018-06-07 MED ORDER — HYDROMORPHONE HCL 1 MG/ML IJ SOLN
0.5000 mg | INTRAMUSCULAR | Status: DC | PRN
  Administered 2018-06-07: 1 mg via INTRAVENOUS
  Filled 2018-06-07: qty 1

## 2018-06-07 MED ORDER — PANTOPRAZOLE SODIUM 40 MG PO TBEC
40.0000 mg | DELAYED_RELEASE_TABLET | Freq: Every day | ORAL | Status: DC
  Administered 2018-06-07 – 2018-06-09 (×3): 40 mg via ORAL
  Filled 2018-06-07 (×3): qty 1

## 2018-06-07 MED ORDER — SUGAMMADEX SODIUM 200 MG/2ML IV SOLN
INTRAVENOUS | Status: DC | PRN
  Administered 2018-06-07: 200 mg via INTRAVENOUS

## 2018-06-07 MED ORDER — ROCURONIUM BROMIDE 10 MG/ML (PF) SYRINGE
PREFILLED_SYRINGE | INTRAVENOUS | Status: AC
  Filled 2018-06-07: qty 10

## 2018-06-07 MED ORDER — MIDAZOLAM HCL 5 MG/5ML IJ SOLN
INTRAMUSCULAR | Status: DC | PRN
  Administered 2018-06-07 (×2): 1 mg via INTRAVENOUS

## 2018-06-07 MED ORDER — ATENOLOL 25 MG PO TABS
25.0000 mg | ORAL_TABLET | Freq: Two times a day (BID) | ORAL | Status: DC
  Administered 2018-06-07 – 2018-06-09 (×4): 25 mg via ORAL
  Filled 2018-06-07 (×4): qty 1

## 2018-06-07 MED ORDER — PROPOFOL 10 MG/ML IV BOLUS
INTRAVENOUS | Status: DC | PRN
  Administered 2018-06-07: 140 mg via INTRAVENOUS

## 2018-06-07 MED ORDER — EPHEDRINE SULFATE-NACL 50-0.9 MG/10ML-% IV SOSY
PREFILLED_SYRINGE | INTRAVENOUS | Status: DC | PRN
  Administered 2018-06-07: 5 mg via INTRAVENOUS
  Administered 2018-06-07: 10 mg via INTRAVENOUS
  Administered 2018-06-07: 15 mg via INTRAVENOUS
  Administered 2018-06-07: 10 mg via INTRAVENOUS

## 2018-06-07 MED ORDER — FENTANYL CITRATE (PF) 100 MCG/2ML IJ SOLN: 25.0000 ug | INTRAMUSCULAR | Status: DC | PRN

## 2018-06-07 MED ORDER — PHENYLEPHRINE HCL (PRESSORS) 10 MG/ML IV SOLN
INTRAVENOUS | Status: AC
  Filled 2018-06-07: qty 1

## 2018-06-07 MED ORDER — BACITRACIN-NEOMYCIN-POLYMYXIN 400-5-5000 EX OINT: 1.0000 "application " | TOPICAL_OINTMENT | Freq: Three times a day (TID) | CUTANEOUS | Status: DC | PRN

## 2018-06-07 MED ORDER — DIPHENHYDRAMINE HCL 50 MG/ML IJ SOLN: 12.5000 mg | Freq: Four times a day (QID) | INTRAMUSCULAR | Status: DC | PRN

## 2018-06-07 MED ORDER — DEXTROSE-NACL 5-0.45 % IV SOLN
INTRAVENOUS | Status: DC
  Administered 2018-06-07 – 2018-06-08 (×2): via INTRAVENOUS

## 2018-06-07 MED ORDER — LACTATED RINGERS IV SOLN
INTRAVENOUS | Status: DC
  Administered 2018-06-07 (×3): via INTRAVENOUS

## 2018-06-07 MED ORDER — SUGAMMADEX SODIUM 200 MG/2ML IV SOLN
INTRAVENOUS | Status: AC
  Filled 2018-06-07: qty 2

## 2018-06-07 MED ORDER — SODIUM CHLORIDE 0.9 % IV SOLN
INTRAVENOUS | Status: DC | PRN
  Administered 2018-06-07: 40 ug/min via INTRAVENOUS

## 2018-06-07 MED ORDER — DEXAMETHASONE SODIUM PHOSPHATE 10 MG/ML IJ SOLN
INTRAMUSCULAR | Status: AC
  Filled 2018-06-07: qty 1

## 2018-06-07 MED ORDER — ACETAMINOPHEN 500 MG PO TABS
1000.0000 mg | ORAL_TABLET | Freq: Once | ORAL | Status: AC
  Administered 2018-06-07: 1000 mg via ORAL
  Filled 2018-06-07: qty 2

## 2018-06-07 MED ORDER — STERILE WATER FOR IRRIGATION IR SOLN
Status: DC | PRN
  Administered 2018-06-07: 1000 mL

## 2018-06-07 MED ORDER — LORAZEPAM 0.5 MG PO TABS
0.5000 mg | ORAL_TABLET | Freq: Two times a day (BID) | ORAL | Status: DC
  Administered 2018-06-07 – 2018-06-09 (×5): 0.5 mg via ORAL
  Filled 2018-06-07 (×5): qty 1

## 2018-06-07 MED ORDER — HYDROCODONE-ACETAMINOPHEN 5-325 MG PO TABS
1.0000 | ORAL_TABLET | ORAL | Status: DC | PRN
  Administered 2018-06-08: 1 via ORAL
  Filled 2018-06-07: qty 1

## 2018-06-07 MED ORDER — COLCHICINE 0.6 MG PO TABS
0.6000 mg | ORAL_TABLET | Freq: Two times a day (BID) | ORAL | Status: DC
  Administered 2018-06-08: 0.6 mg via ORAL
  Filled 2018-06-07 (×2): qty 1

## 2018-06-07 MED ORDER — DIATRIZOATE MEGLUMINE 30 % UR SOLN
URETHRAL | Status: AC
  Filled 2018-06-07: qty 100

## 2018-06-07 MED ORDER — SUCCINYLCHOLINE CHLORIDE 200 MG/10ML IV SOSY
PREFILLED_SYRINGE | INTRAVENOUS | Status: DC | PRN
  Administered 2018-06-07: 200 mg via INTRAVENOUS

## 2018-06-07 MED ORDER — FENTANYL CITRATE (PF) 100 MCG/2ML IJ SOLN
INTRAMUSCULAR | Status: DC | PRN
  Administered 2018-06-07 (×3): 50 ug via INTRAVENOUS
  Administered 2018-06-07: 100 ug via INTRAVENOUS

## 2018-06-07 MED ORDER — GENTAMICIN SULFATE 40 MG/ML IJ SOLN
5.0000 mg/kg | Freq: Once | INTRAVENOUS | Status: AC
  Administered 2018-06-07: 430 mg via INTRAVENOUS
  Filled 2018-06-07: qty 10.75

## 2018-06-07 MED ORDER — ONDANSETRON HCL 4 MG/2ML IJ SOLN
INTRAMUSCULAR | Status: DC | PRN
  Administered 2018-06-07: 4 mg via INTRAVENOUS

## 2018-06-07 MED ORDER — SODIUM CHLORIDE (PF) 0.9 % IJ SOLN
INTRAMUSCULAR | Status: DC | PRN
  Administered 2018-06-07: 20 mL

## 2018-06-07 MED ORDER — BELLADONNA ALKALOIDS-OPIUM 16.2-60 MG RE SUPP
1.0000 | Freq: Four times a day (QID) | RECTAL | Status: DC | PRN
  Administered 2018-06-07 (×2): 1 via RECTAL
  Filled 2018-06-07 (×2): qty 1

## 2018-06-07 MED ORDER — COLCHICINE 0.6 MG PO CAPS: 0.6000 mg | ORAL_CAPSULE | Freq: Two times a day (BID) | ORAL | Status: DC

## 2018-06-07 MED ORDER — BISACODYL 5 MG PO TBEC: 5.0000 mg | DELAYED_RELEASE_TABLET | Freq: Every day | ORAL | Status: DC | PRN

## 2018-06-07 MED ORDER — PROPOFOL 10 MG/ML IV BOLUS
INTRAVENOUS | Status: AC
  Filled 2018-06-07: qty 20

## 2018-06-07 SURGICAL SUPPLY — 55 items
APL PRP STRL LF DISP 70% ISPRP (MISCELLANEOUS) ×1
CATH INTERMIT  6FR 70CM (CATHETERS) ×1 IMPLANT
CHLORAPREP W/TINT 26 (MISCELLANEOUS) ×2 IMPLANT
CLIP VESOLOCK LG 6/CT PURPLE (CLIP) ×2 IMPLANT
CLIP VESOLOCK MED LG 6/CT (CLIP) ×2 IMPLANT
COVER SURGICAL LIGHT HANDLE (MISCELLANEOUS) ×2 IMPLANT
COVER TIP SHEARS 8 DVNC (MISCELLANEOUS) ×1 IMPLANT
COVER TIP SHEARS 8MM DA VINCI (MISCELLANEOUS) ×1
COVER WAND RF STERILE (DRAPES) IMPLANT
DECANTER SPIKE VIAL GLASS SM (MISCELLANEOUS) ×2 IMPLANT
DERMABOND ADVANCED (GAUZE/BANDAGES/DRESSINGS) ×1
DERMABOND ADVANCED .7 DNX12 (GAUZE/BANDAGES/DRESSINGS) ×1 IMPLANT
DRAIN CHANNEL 15F RND FF 3/16 (WOUND CARE) IMPLANT
DRAPE ARM DVNC X/XI (DISPOSABLE) ×4 IMPLANT
DRAPE COLUMN DVNC XI (DISPOSABLE) ×1 IMPLANT
DRAPE DA VINCI XI ARM (DISPOSABLE) ×4
DRAPE DA VINCI XI COLUMN (DISPOSABLE) ×1
DRAPE INCISE IOBAN 66X45 STRL (DRAPES) ×2 IMPLANT
DRAPE SHEET LG 3/4 BI-LAMINATE (DRAPES) ×2 IMPLANT
ELECT PENCIL ROCKER SW 15FT (MISCELLANEOUS) ×2 IMPLANT
ELECT REM PT RETURN 15FT ADLT (MISCELLANEOUS) ×2 IMPLANT
EVACUATOR SILICONE 100CC (DRAIN) IMPLANT
GLOVE BIO SURGEON STRL SZ 6.5 (GLOVE) ×2 IMPLANT
GLOVE BIO SURGEON STRL SZ8 (GLOVE) ×4 IMPLANT
GLOVE BIOGEL PI IND STRL 8 (GLOVE) ×2 IMPLANT
GLOVE BIOGEL PI INDICATOR 8 (GLOVE) ×2
GOWN STRL REUS W/TWL LRG LVL3 (GOWN DISPOSABLE) ×4 IMPLANT
GOWN STRL REUS W/TWL XL LVL3 (GOWN DISPOSABLE) ×5 IMPLANT
GUIDEWIRE STR DUAL SENSOR (WIRE) ×1 IMPLANT
IRRIG SUCT STRYKERFLOW 2 WTIP (MISCELLANEOUS) ×2
IRRIGATION SUCT STRKRFLW 2 WTP (MISCELLANEOUS) ×1 IMPLANT
KIT BASIN OR (CUSTOM PROCEDURE TRAY) ×2 IMPLANT
KIT TURNOVER KIT A (KITS) ×1 IMPLANT
NDL INSUFFLATION 14GA 120MM (NEEDLE) ×1 IMPLANT
NEEDLE INSUFFLATION 14GA 120MM (NEEDLE) ×2 IMPLANT
NS IRRIG 1000ML POUR BTL (IV SOLUTION) ×2 IMPLANT
PACK CYSTO (CUSTOM PROCEDURE TRAY) ×1 IMPLANT
PROTECTOR NERVE ULNAR (MISCELLANEOUS) ×4 IMPLANT
SCISSORS LAP 5X35 DISP (ENDOMECHANICALS) ×1 IMPLANT
SEAL CANN UNIV 5-8 DVNC XI (MISCELLANEOUS) ×4 IMPLANT
SEAL XI 5MM-8MM UNIVERSAL (MISCELLANEOUS) ×4
SOLUTION ELECTROLUBE (MISCELLANEOUS) ×2 IMPLANT
SUT ETHILON 3 0 PS 1 (SUTURE) IMPLANT
SUT MNCRL AB 4-0 PS2 18 (SUTURE) ×4 IMPLANT
SUT VIC AB 0 CT1 27 (SUTURE) ×2
SUT VIC AB 0 CT1 27XBRD ANTBC (SUTURE) IMPLANT
SUT VIC AB 4-0 RB1 27 (SUTURE) ×4
SUT VIC AB 4-0 RB1 27XBRD (SUTURE) IMPLANT
SUT VICRYL 0 UR6 27IN ABS (SUTURE) ×1 IMPLANT
SYR BULB IRRIGATION 50ML (SYRINGE) IMPLANT
TOWEL OR 17X26 10 PK STRL BLUE (TOWEL DISPOSABLE) ×2 IMPLANT
TOWEL OR NON WOVEN STRL DISP B (DISPOSABLE) ×2 IMPLANT
TRAY FOLEY MTR SLVR 16FR STAT (SET/KITS/TRAYS/PACK) ×2 IMPLANT
TRAY LAPAROSCOPIC (CUSTOM PROCEDURE TRAY) ×2 IMPLANT
TROCAR XCEL 12X100 BLDLESS (ENDOMECHANICALS) ×2 IMPLANT

## 2018-06-07 NOTE — Anesthesia Procedure Notes (Signed)
Procedure Name: Intubation Date/Time: 06/07/2018 7:52 AM Performed by: Montel Clock, CRNA Pre-anesthesia Checklist: Patient identified, Emergency Drugs available, Suction available, Patient being monitored and Timeout performed Patient Re-evaluated:Patient Re-evaluated prior to induction Oxygen Delivery Method: Circle system utilized Preoxygenation: Pre-oxygenation with 100% oxygen Induction Type: IV induction, Rapid sequence and Cricoid Pressure applied Laryngoscope Size: Mac and 3 Grade View: Grade I Tube type: Oral Tube size: 7.5 mm Number of attempts: 1 Airway Equipment and Method: Stylet Placement Confirmation: ETT inserted through vocal cords under direct vision,  positive ETCO2 and breath sounds checked- equal and bilateral Secured at: 23 cm Tube secured with: Tape Dental Injury: Teeth and Oropharynx as per pre-operative assessment

## 2018-06-07 NOTE — Anesthesia Postprocedure Evaluation (Signed)
Anesthesia Post Note  Patient: David [name]  Procedure(s) Performed: attempted XI ROBOTIC ASSISTED PYELOPLASTY, lysis of adhesions (Right ) CYSTOSCOPY WITH RETROGRADE PYELOGRAM, uphrostogram (Right )     Patient location during evaluation: PACU Anesthesia Type: General Level of consciousness: awake and alert Pain management: pain level controlled Vital Signs Assessment: post-procedure vital signs reviewed and stable Respiratory status: spontaneous breathing, nonlabored ventilation, respiratory function stable and patient connected to nasal cannula oxygen Cardiovascular status: blood pressure returned to baseline and stable Postop Assessment: no apparent nausea or vomiting Anesthetic complications: no    Last Vitals:  Vitals:   06/07/18 1200 06/07/18 1217  BP:  140/83  Pulse: 72 69  Resp: 17 18  Temp: (!) 36.3 C (!) 36.4 C  SpO2: 98% 95%    Last Pain:  Vitals:   06/07/18 1217  TempSrc: Oral  PainSc:                  Chelsey L Woodrum

## 2018-06-07 NOTE — H&P (Signed)
Urology Admission H&P  Chief Complaint: right flank pain  History of Present Illness: David [name] is a 73yo with a history of nephrolithiasis who developed a right ureteral stricture after a stone extraction. He is currently managed with a nephrostomy tube. He has intermittent right flank pain. No fevers/chills/sweats.  Past Medical History:  Diagnosis Date  . Abnormal liver function 03/18/2013  . Adenoma of right adrenal gland 07/11/2002   2.4cm , noted on CT ABD  . Anxiety   . Arthritis of both knees 03/26/2016  . Astigmatism   . Atrial fibrillation ([place])    x1 successful cardioversions  . Back pain   . BCC (basal cell carcinoma of skin)    under right eye and right ear  . Cataract 09/29/2016  . Depression   . Diverticulitis   . Diverticulosis   . Fatty liver   . GERD (gastroesophageal reflux disease)   . Gout   . Hearing loss of both ears 03/26/2016  . History of chronic prostatitis    started at age 71  . History of kidney stones   . Hyperglycemia 03/29/2016  . Hyperlipidemia   . Hypertension   . IBS (irritable bowel syndrome) 03/18/2013  . Incomplete right bundle branch block (RBBB)   . Internal hemorrhoids   . Kidney lesion 06/07/2015   Right midportion, 1.1x1.1 cm hyperechoic, noted on [place] ABD  . LAFB (left anterior fascicular block)   . Lipoma of axilla 09/2016  . Liver lesion, right lobe 06/07/2015   2.5x2.4x2.4 cm hypoechoic lesion posterior aspect, noted on [place] ABD  . Low back pain 03/26/2016  . LVH (left ventricular hypertrophy) 08/06/2017   Moderate, noted on ECHO  . Medicare annual wellness visit, subsequent 03/12/2014  . Muscle cramps   . Nocturia   . OA (osteoarthritis)    Back, Hands, Neck  . Obesity 11/25/2007   Qualifier: Diagnosis of  By: Lenna Gilford MD, Deborra Medina   . Other malaise and fatigue 03/13/2013  . Penis symptom or sign    PENIS RED NO DRAINAGE   . Premature ventricular complex   . Preventative health care 03/07/2015  . Renal insufficiency   . Scoliosis    Upper thoracic and lumbar  . Vitamin D deficiency    resolved   Past Surgical History:  Procedure Laterality Date  . CARDIOVERSION  07/31/2017  . CATARACT EXTRACTION, BILATERAL    . COLON SURGERY  2009   segmental sigmoid resection  . COLONOSCOPY    . CYSTOSCOPY WITH URETEROSCOPY AND STENT PLACEMENT Right 02/28/2018   Procedure: CYSTOSCOPY WITH URETEROSCOPY Concha Se;  Surgeon: Kathie Rhodes, MD;  Location: [facility];  Service: Urology;  Laterality: Right;  . CYSTOSCOPY/URETEROSCOPY/HOLMIUM LASER/STENT PLACEMENT Right 11/29/2017   Procedure: CYSTOSCOPY/RETROGRADE/URETEROSCOPY/HOLMIUM LASER/STENT PLACEMENT;  Surgeon: Kathie Rhodes, MD;  [facility];  Service: Urology;  Laterality: Right;  . EYE SURGERY Bilateral 01/12/2017   cataract removal  . HOLMIUM LASER APPLICATION Right 3/55/7322   Procedure: HOLMIUM LASER APPLICATION;  Surgeon: Kathie Rhodes, MD;  Location: [facility];  Service: Urology;  Laterality: Right;  . IR BALLOON DILATION URETERAL STRICTURE RIGHT  03/14/2018  . IR NEPHRO TUBE REMOV/FL  03/17/2018  . IR NEPHROSTOGRAM RIGHT THRU EXISTING ACCESS  03/17/2018  . IR NEPHROSTOMY EXCHANGE RIGHT  03/14/2018  . IR NEPHROSTOMY PLACEMENT RIGHT  03/02/2018  . IR NEPHROSTOMY PLACEMENT RIGHT  04/20/2018  . IR URETERAL STENT PLACEMENT EXISTING ACCESS RIGHT  03/14/2018  . NOSE SURGERY     Submucous resection  age 64  . NUCLEAR STRESS TEST  06/03/2009  . SKIN BIOPSY    . URETEROSCOPY WITH HOLMIUM LASER LITHOTRIPSY Right 09/24/2017   Procedure: CYSTOSCOPY, URETEROSCOPY WITH HOLMIUM LASER LITHOTRIPSY, STENT PLACEMENT;  Surgeon: Kathie Rhodes, MD;  Location: [facility];  Service: Urology;  Laterality: Right;    Home Medications:  Current Facility-Administered Medications  Medication Dose Route Frequency Provider Last Rate Last Dose  . bupivacaine liposome (EXPAREL) 1.3 % injection 266 mg  20 mL Infiltration Once Cleon Gustin, MD      .  gentamicin (GARAMYCIN) 430 mg in dextrose 5 % 100 mL IVPB  5 mg/kg (Adjusted) Intravenous Once Cleon Gustin, MD      . lactated ringers infusion   Intravenous Continuous Freddrick March, MD 10 mL/hr at 06/07/18 0606    . lactated ringers irrigation solution    PRN Cleon Gustin, MD   1,000 mL at 06/07/18 0735  . magnesium citrate solution 1 Bottle  1 Bottle Oral Once Saamir Armstrong, Candee Furbish, MD      . sterile water for irrigation for irrigation    PRN Alyson Ingles Candee Furbish, MD   1,000 mL at 06/07/18 0736  . water for irrigation, sterile for irrigation SOLN    PRN Cleon Gustin, MD   3,000 mL at 06/07/18 0736   Allergies:  Allergies  Allergen Reactions  . Cefaclor Hives  . Cephalosporins Hives  . Penicillins Rash    Mild maculopapular rash Has patient had a PCN reaction causing immediate rash, facial/tongue/throat swelling, SOB or lightheadedness with hypotension: No Has patient had a PCN reaction causing severe rash involving mucus membranes or skin necrosis: No Has patient had a PCN reaction that required hospitalization: No Has patient had a PCN reaction occurring within the last 10 years: No If all of the above answers are "NO", then may proceed with Cephalosporin use.     Family History  Problem Relation Age of Onset  . Heart disease Mother   . Hypertension Mother   . Stroke Mother   . Colon cancer Mother   . Breast cancer Mother   . Heart disease Father        pacemaker  . Aortic aneurysm Father   . Hypertension Father   . Heart disease Sister   . Atrial fibrillation Sister   . Obesity Sister   . Sleep apnea Sister   . Heart attack Brother   . Other Brother        muscle disease  . Arthritis Brother   . Stroke Brother   . Atrial fibrillation Brother   . Cancer Maternal Grandmother        ?  Marland Kitchen Heart attack Maternal Grandmother   . Diabetes Maternal Grandmother   . Cancer Maternal Grandfather        hodgin's lymphoma  . Heart attack Paternal  Grandmother   . Anxiety disorder Paternal Grandmother   . Pneumonia Paternal Grandfather   . Heart attack Brother   . Atrial fibrillation Brother   . Atrial fibrillation Brother   . Heart attack Brother   . Hypertension Brother   . Hyperlipidemia Brother   . Heart attack Brother   . Other Brother        heart valve operation  . Atrial fibrillation Brother    Social History:  reports that he has quit smoking. His smoking use included cigarettes. He started smoking about 49 years ago. He has a 6.00 pack-year smoking history. He  has never used smokeless tobacco. He reports previous alcohol use. He reports that he does not use drugs.  Review of Systems  Genitourinary: Positive for flank pain.  All other systems reviewed and are negative.   Physical Exam:  Vital signs in last 24 hours: Temp:  [98.7 F (37.1 C)] 98.7 F (37.1 C) (05/26 0515) Pulse Rate:  [61] 61 (05/26 0515) Resp:  [15] 15 (05/26 0515) BP: (134)/(80) 134/80 (05/26 0515) SpO2:  [97 %] 97 % (05/26 0515) Weight:  [99.8 kg] 99.8 kg (05/26 0515) Physical Exam  Constitutional: He is oriented to person, place, and time. He appears well-developed and well-nourished.  HENT:  Head: Normocephalic and atraumatic.  Eyes: Pupils are equal, round, and reactive to light. EOM are normal.  Neck: Normal range of motion. No thyromegaly present.  Cardiovascular: Normal rate and regular rhythm.  Respiratory: Effort normal. No respiratory distress.  GI: Soft. He exhibits no distension.  Musculoskeletal: Normal range of motion.        General: No edema.  Neurological: He is alert and oriented to person, place, and time.  Skin: Skin is warm and dry.  Psychiatric: He has a normal mood and affect. His behavior is normal. Judgment and thought content normal.    Laboratory Data:  No results found for this or any previous visit (from the past 24 hour(s)). Recent Results (from the past 240 hour(s))  Novel Coronavirus, NAA (hospital  order; send-out to ref lab)     Status: None   Collection Time: 06/03/18 10:34 AM  Result Value Ref Range Status   SARS-CoV-2, NAA NOT DETECTED NOT DETECTED Final    Comment: (NOTE) Testing was performed using the cobas(R) SARS-CoV-2 test. This test was developed and its performance characteristics determined by [employer]. This test has not been FDA cleared or approved. This test has been authorized by FDA under an Emergency Use Authorization (EUA). This test is only authorized for the duration of time the declaration that circumstances exist justifying the authorization of the emergency use of in vitro diagnostic tests for detection of SARS-CoV-2 virus and/or diagnosis of COVID-19 infection under section 564(b)(1) of the Act, 21 U.S.C. 585IDP-8(E)(4), unless the authorization is terminated or revoked sooner. When diagnostic testing is negative, the possibility of a false negative result should be considered in the context of a patient's recent exposures and the presence of clinical signs and symptoms consistent with COVID-19. An individual without symptoms of COVID-19 and who is not shedding SARS-CoV-2 virus would expect to have  a negative (not detected) result in this assay. Performed At: [facility] [address] [place], [place] [id number] Rush Farmer MD [id number]    [place]  Final    Comment: Performed at [facility], [place] [address] [place], [place] [address]   Creatinine: Recent Labs    06/01/18 0844  CREATININE 1.53*   Baseline Creatinine: 1.5  Impression/Assessment:  73yo with right proximal ureteral stricture  Plan:  The risks/benefits/alternatives to robot assisted laparoscopic right ureteroureterostomy was explained to the patient and he understands and wishes to proceed with surgery  Nicolette Bang 06/07/2018, 7:37 AM

## 2018-06-07 NOTE — Transfer of Care (Signed)
Immediate Anesthesia Transfer of Care Note  Patient: David [name]  Procedure(s) Performed: attempted XI ROBOTIC ASSISTED PYELOPLASTY, lysis of adhesions (Right ) CYSTOSCOPY WITH RETROGRADE PYELOGRAM, uphrostogram (Right )  Patient Location: PACU  Anesthesia Type:General  Level of Consciousness: sedated  Airway & Oxygen Therapy: Patient Spontanous Breathing and Patient connected to face mask oxygen  Post-op Assessment: Report given to RN and Post -op Vital signs reviewed and stable  Post vital signs: Reviewed and stable  Last Vitals:  Vitals Value Taken Time  BP 136/87 06/07/2018 10:55 AM  Temp    Pulse 78 06/07/2018 10:57 AM  Resp 10 06/07/2018 10:57 AM  SpO2 100 % 06/07/2018 10:57 AM  Vitals shown include unvalidated device data.  Last Pain:  Vitals:   06/07/18 0559  TempSrc:   PainSc: 0-No pain         Complications: No apparent anesthesia complications

## 2018-06-07 NOTE — Brief Op Note (Signed)
06/07/2018  10:44 AM  PATIENT:  David [name]  73 y.o. male  PRE-OPERATIVE DIAGNOSIS:  RIGHT URETERAL STRICTURE  POST-OPERATIVE DIAGNOSIS:  RIGHT URETERAL STRICTURE  PROCEDURE:  Procedure(s) with comments: attempted XI ROBOTIC ASSISTED PYELOPLASTY, lysis of adhesions (Right) - 3 HRS CYSTOSCOPY WITH RETROGRADE PYELOGRAM, uphrostogram (Right)  SURGEON:  Surgeon(s) and Role:    * McKenzie, Candee Furbish, MD - Primary  PHYSICIAN ASSISTANT:   ASSISTANTS: Debbrah Alar, PA   ANESTHESIA:   general  EBL:  50 mL   BLOOD ADMINISTERED:none  DRAINS: 16 french foley, right nephrostomy tube   LOCAL MEDICATIONS USED:  MARCAINE     SPECIMEN:  No Specimen  DISPOSITION OF SPECIMEN:  N/A  COUNTS:  YES  TOURNIQUET:  * No tourniquets in log *  DICTATION: .Note written in EPIC  PLAN OF CARE: Admit for overnight observation  PATIENT DISPOSITION:  PACU - hemodynamically stable.   Delay start of Pharmacological VTE agent (>24hrs) due to surgical blood loss or risk of bleeding: not applicable

## 2018-06-07 NOTE — Discharge Instructions (Signed)

## 2018-06-08 ENCOUNTER — Encounter (HOSPITAL_COMMUNITY): Payer: Self-pay | Admitting: Urology

## 2018-06-08 ENCOUNTER — Observation Stay (HOSPITAL_COMMUNITY): Payer: Medicare Other

## 2018-06-08 DIAGNOSIS — N135 Crossing vessel and stricture of ureter without hydronephrosis: Secondary | ICD-10-CM | POA: Diagnosis not present

## 2018-06-08 LAB — BASIC METABOLIC PANEL
Anion gap: 8 (ref 5–15)
BUN: 16 mg/dL (ref 8–23)
CO2: 23 mmol/L (ref 22–32)
Calcium: 9.4 mg/dL (ref 8.9–10.3)
Chloride: 102 mmol/L (ref 98–111)
Creatinine, Ser: 1.47 mg/dL — ABNORMAL HIGH (ref 0.61–1.24)
GFR calc Af Amer: 54 mL/min — ABNORMAL LOW (ref >60)
GFR calc non Af Amer: 47 mL/min — ABNORMAL LOW (ref >60)
Glucose, Bld: 152 mg/dL — ABNORMAL HIGH (ref 70–99)
Potassium: 4.5 mmol/L (ref 3.5–5.1)
Sodium: 133 mmol/L — ABNORMAL LOW (ref 135–145)

## 2018-06-08 LAB — HEMOGLOBIN AND HEMATOCRIT, BLOOD
HCT: 39.5 % (ref 39.0–52.0)
Hemoglobin: 12.5 g/dL — ABNORMAL LOW (ref 13.0–17.0)

## 2018-06-08 MED ORDER — FUROSEMIDE 10 MG/ML IJ SOLN
INTRAMUSCULAR | Status: AC
  Filled 2018-06-08: qty 8

## 2018-06-08 MED ORDER — CLOTRIMAZOLE-BETAMETHASONE 1-0.05 % EX CREA
TOPICAL_CREAM | Freq: Two times a day (BID) | CUTANEOUS | Status: DC
  Filled 2018-06-08: qty 15

## 2018-06-08 MED ORDER — PHENOL 1.4 % MT LIQD
1.0000 | OROMUCOSAL | Status: DC | PRN
  Filled 2018-06-08: qty 177

## 2018-06-08 MED ORDER — FLUOXETINE HCL 10 MG PO CAPS
10.0000 mg | ORAL_CAPSULE | Freq: Every day | ORAL | Status: DC
  Filled 2018-06-08 (×2): qty 1

## 2018-06-08 MED ORDER — FUROSEMIDE 10 MG/ML IJ SOLN
50.0000 mg | Freq: Once | INTRAMUSCULAR | Status: AC
  Administered 2018-06-08: 50 mg via INTRAVENOUS

## 2018-06-08 MED ORDER — TECHNETIUM TC 99M MERTIATIDE
5.2000 | Freq: Once | INTRAVENOUS | Status: AC | PRN
  Administered 2018-06-08: 5.2 via INTRAVENOUS

## 2018-06-08 NOTE — Progress Notes (Signed)
1 Day Post-Op Subjective: Patient reports mild abdominal pain. No fevers. Pain contirlled with pain medication  Objective: Vital signs in last 24 hours: Temp:  [97.9 F (36.6 C)-98.2 F (36.8 C)] 98.1 F (36.7 C) (05/27 1520) Pulse Rate:  [65-86] 65 (05/27 1520) Resp:  [16-20] 20 (05/27 1520) BP: (124-153)/(65-80) 153/80 (05/27 1520) SpO2:  [95 %-98 %] 97 % (05/27 1520)  Intake/Output from previous day: 05/26 0701 - 05/27 0700 In: 3990.5 [P.O.:720; I.V.:3270.5] Out: 2025 [Urine:1975; Blood:50] Intake/Output this shift: Total I/O In: 618.4 [I.V.:618.4] Out: 1550 [Urine:1550]  Physical Exam:  General:alert, cooperative and appears stated age GI: soft and tenderness: RUQ and RLQ Male genitalia: not done Extremities: extremities normal, atraumatic, no cyanosis or edema  Lab Results: Recent Labs    06/07/18 1107 06/08/18 0422  HGB 13.4 12.5*  HCT 43.2 39.5   BMET Recent Labs    06/08/18 0422  NA 133*  K 4.5  CL 102  CO2 23  GLUCOSE 152*  BUN 16  CREATININE 1.47*  CALCIUM 9.4   No results for input(s): LABPT, INR in the last 72 hours. No results for input(s): LABURIN in the last 72 hours. Results for orders placed or performed during the hospital encounter of 06/03/18  Novel Coronavirus, NAA (hospital order; send-out to ref lab)     Status: None   Collection Time: 06/03/18 10:34 AM  Result Value Ref Range Status   SARS-CoV-2, NAA NOT DETECTED NOT DETECTED Final    Comment: (NOTE) Testing was performed using the cobas(R) SARS-CoV-2 test. This test was developed and its performance characteristics determined by [employer]. This test has not been FDA cleared or approved. This test has been authorized by FDA under an Emergency Use Authorization (EUA). This test is only authorized for the duration of time the declaration that circumstances exist justifying the authorization of the emergency use of in vitro diagnostic tests for detection of SARS-CoV-2  virus and/or diagnosis of COVID-19 infection under section 564(b)(1) of the Act, 21 U.S.C. 660AYO-4(H)(9), unless the authorization is terminated or revoked sooner. When diagnostic testing is negative, the possibility of a false negative result should be considered in the context of a patient's recent exposures and the presence of clinical signs and symptoms consistent with COVID-19. An individual without symptoms of COVID-19 and who is not shedding SARS-CoV-2 virus would expect to have  a negative (not detected) result in this assay. Performed At: [facility] [address] [place], [place] [id number] Rush Farmer MD RV:[id number]    [place]  Final    Comment: Performed at [facility], [place] [address], [place], [place] [address]    Studies/Results: Nm Renal Imaging Flow W/pharm  Result Date: 06/08/2018 CLINICAL DATA:  RIGHT ureteral stricture with surgery 06/07/2018. RIGHT nephrostomy tube in place. EXAM: NUCLEAR MEDICINE RENAL SCAN WITH DIURETIC ADMINISTRATION TECHNIQUE: Radionuclide angiographic and sequential renal images were obtained after intravenous injection of radiopharmaceutical. Imaging was continued during slow intravenous injection of Lasix approximately 15 minutes after the start of the examination. RADIOPHARMACEUTICALS:  5.2 mCi Technetium-30m MAG3 IV COMPARISON:  CT 03/17/2018 FINDINGS: Flow: Slight delay in flow to the RIGHT kidney compared to the LEFT period. Left renogram: Normal renal cortical uptake in the LEFT kidney. Counts are excreted into the LEFT collecting system prior to administration of Lasix. Counts clear near completely prior to the administration Lasix. Lasix does augments clearance from the LEFT renal pelvis. No significant postvoid residual on the LEFT. Right renogram: The RIGHT kidney is  smaller than the LEFT. There is delayed cortical uptake. Counts collect in the RIGHT renal pelvis. RIGHT ureter not clearly  identified. Delayed imaging demonstrates counts collecting inferolateral to the lower pole RIGHT kidney which presumably represents the nephrostomy tube tract. The activity is broader than would be expected for a tube therefore could represent leakage around the catheter. No clear activity seen in the RIGHT ureter. Differential: Left kidney = 79 % Right kidney = 21 % T1/2 post Lasix : Left kidney = (counts clear near completely prior to administration of Lasix) min Right kidney = no activity seen in the ureter IMPRESSION: 1. No clear activity identified in the RIGHT ureter. Counts/urine extend posterolateral to the RIGHT kidney presumably along the tract of the nephrostomy tube. The collection is broader than the expected diameter the tube. Activity may represent a collection on the skin surface with indicating leak along the nephrostomy tube tract. Alternatively collection could be within the retroperitoneal space. The nephrostomy tube was clamped during study. The nephrostomy collection bag was not in the field of view. 2. Normal LEFT kidney. Electronically Signed   By: Suzy Bouchard M.D.   On: 06/08/2018 15:50   Dg C-arm 1-60 Min-no Report  Result Date: 06/07/2018 Fluoroscopy was utilized by the requesting physician.  No radiographic interpretation.    Assessment/Plan: POD#1 diagnostic laparoscopy  1. Advance diet as tolerated 2. Lasix renogram 3. D/c foley   LOS: 1 day   Nicolette Bang 06/08/2018, 6:40 PM

## 2018-06-08 NOTE — Progress Notes (Signed)
Pt foley removed @ 1115am, pt voided 300cc clear yellow urine prior to xray procedure. Pt transported to Xray for procedure. SRP, RN

## 2018-06-08 NOTE — Progress Notes (Signed)
Pt noted with reddness on the tip of penis, resemble "fungi". Pt states, " had a yeast infection about 2 weeks ago and I had the same symptoms on my penis, my doctors prescribed a med for me, will you call my doctor?" MD made aware and orders received. Pt IVF also stopped per MD order. SRP, RN

## 2018-06-09 DIAGNOSIS — N135 Crossing vessel and stricture of ureter without hydronephrosis: Secondary | ICD-10-CM | POA: Diagnosis not present

## 2018-06-09 MED ORDER — KETOCONAZOLE 2 % EX CREA
TOPICAL_CREAM | Freq: Two times a day (BID) | CUTANEOUS | Status: DC
  Filled 2018-06-09: qty 15

## 2018-06-09 NOTE — Progress Notes (Signed)
This RN has assumed care over the pt at this time. Agree with previous nurse's assessment. Pt resting in bed at this time.  

## 2018-06-09 NOTE — Progress Notes (Signed)
AVS reviewed with patient.  Verbalized understanding of discharge instructions, physician follow-up, medications.  Patient's IV removed.  Site WNL.  Patient transported by NT via wheelchair to main entrance at discharge.  Patient stable at time of discharge.  Belongings in possession.

## 2018-06-10 ENCOUNTER — Other Ambulatory Visit: Payer: Self-pay | Admitting: Urology

## 2018-06-15 NOTE — Discharge Summary (Signed)
Physician Discharge Summary  Patient ID: David [name] MRN: [id number] DOB/AGE: 73-May-1947 73 y.o.  Admit date: 06/07/2018 Discharge date: 06/09/2018  Admission Diagnoses: Ureteral stricture Discharge Diagnoses:  Active Problems:   Ureteral obstruction   Discharged Condition: good  Hospital Course: The patient tolerated the procedure well and was transferred to the floor on IV pain meds, IV fluid. On POD#1 foley was removed, pt was started on clear liquid diet and they ambulated in the halls. On POD#2 the patient was transitioned to a regular diet, IVFs were discontinued, and the patient passed flatus. Prior to discharge the pt was tolerating a regular diet, pain was controlled on PO pain meds, they were ambulating without difficulty, and they had normal bowel function.  Consults: None  Significant Diagnostic Studies: lasix renogram  Treatments: surgery: cystoscopy, diagnostic laparoscopy  Discharge Exam: Blood pressure 130/72, pulse 66, temperature 97.7 F (36.5 C), temperature source Oral, resp. rate 18, height 6' (1.829 m), weight 99.8 kg, SpO2 96 %. General appearance: alert, cooperative and appears stated age Head: Normocephalic, without obvious abnormality, atraumatic Nose: Nares normal. Septum midline. Mucosa normal. No drainage or sinus tenderness. Back: symmetric, no curvature. ROM normal. No CVA tenderness. Resp: clear to auscultation bilaterally Cardio: regular rate and rhythm, S1, S2 normal, no murmur, click, rub or gallop GI: soft, non-tender; bowel sounds normal; no masses,  no organomegaly Extremities: extremities normal, atraumatic, no cyanosis or edema Neurologic: Grossly normal  Disposition:    Allergies as of 06/09/2018      Reactions   Cefaclor Hives   Cephalosporins Hives   Penicillins Rash   Mild maculopapular rash Has patient had a PCN reaction causing immediate rash, facial/tongue/throat swelling, SOB or lightheadedness with hypotension: No Has  patient had a PCN reaction causing severe rash involving mucus membranes or skin necrosis: No Has patient had a PCN reaction that required hospitalization: No Has patient had a PCN reaction occurring within the last 10 years: No If all of the above answers are "NO", then may proceed with Cephalosporin use.      Medication List    STOP taking these medications   PROBIOTIC DAILY PO     TAKE these medications   acetaminophen 500 MG tablet Commonly known as:  TYLENOL Take 1,000 mg by mouth every 6 (six) hours as needed (pain).   atenolol 25 MG tablet Commonly known as:  TENORMIN TAKE ONE TABLET BY MOUTH TWICE A DAY   bisacodyl 5 MG EC tablet Commonly known as:  DULCOLAX Take 5 mg by mouth daily as needed for moderate constipation.   carboxymethylcellulose 0.5 % Soln Commonly known as:  REFRESH PLUS Place 1 drop into both eyes daily.   Colchicine 0.6 MG Caps 1 tab po bid   cyclobenzaprine 5 MG tablet Commonly known as:  FLEXERIL Take 1 tablet (5 mg total) by mouth 3 (three) times daily as needed for muscle spasms.   fluconazole 150 MG tablet Commonly known as:  DIFLUCAN Take 1 tablet (150 mg total) by mouth daily.   FLUoxetine 10 MG tablet Commonly known as:  PROZAC Take 0.5 tablets (5 mg total) by mouth 2 (two) times a day.   fluticasone 50 MCG/ACT nasal spray Commonly known as:  FLONASE Place 2 sprays into the nose as needed. For congestion   HYDROcodone-acetaminophen 5-325 MG tablet Commonly known as:  Norco Take 1-2 tablets by mouth every 6 (six) hours as needed for moderate pain or severe pain.   ICY HOT BACK EX Apply 1 application  topically daily as needed (back pain).   LORazepam 1 MG tablet Commonly known as:  ATIVAN Take 1 tablet (1 mg total) by mouth 2 (two) times daily as needed for anxiety or sedation. What changed:    how much to take  when to take this   Lotrisone cream Generic drug:  clotrimazole-betamethasone Apply 1 application topically 2  (two) times daily.   omeprazole 20 MG capsule Commonly known as:  PRILOSEC Take 20 mg by mouth daily as needed (acid reflux).      Follow-up Information    Jaedyn Lard, Candee Furbish, MD On 06/16/2018.   Specialty:  Urology Why:  at 9:15 Contact information: [address] [place] [address] [phone]           Signed: Nicolette Bang 06/15/2018, 9:59 PM

## 2018-06-15 NOTE — Op Note (Addendum)
Preoperative diagnosis: Right ureteral stricture  Postop diagnosis: Same  Procedure: 1. cystoscopy 2. Right retrograde pyelogram 3. Right nephrostogram 4.  Intraoperative fluoroscopy, under 1 hour with interpretation 5. Lysis of adhesions, moderate 6.  Right robot assisted laparoscopic rigth ureteral resection.   Attending: Nicolette Bang  Assistant: Clemetine Marker  Anesthesia: General  Estimated blood loss: 50 cc  Drains: 16 French Foley catheter, JP drain, right nephrostomy tube  Specimens: none  Antibiotics: ancef  Findings: ureteral stricture 3cm based on right retrograde pyelogram and right nephrostogram. Dense adhesions of colon and omentum to anterior abdominal wall requiring 30 minutes to dissect. Devascularized ureter from the renal pelvis to the iliac vessels not amenable for repair. The assistant was utilized for retraction, suction, and passing instrucments  Indications: Patient is a 73 year old with a history of  A 2-3 cm right proximal ureteral stricture that failed dilation.   After discussing treatment options patient decided to proceed with right robot assisted laparoscopic ureteroureterostomy.   Procedure in detail: Prior to procedure consent was obtained. Patient was brought to the operating room and briefing was done sure correct patient, correct procedure, correct site.  General anesthesia was in administered patient was placed in the dorsal lithotomy position. His genitalia was then prepped and draped in the usual sterile fashion. A 17 french cystoscope was passed into the urethra and bladder. No masses or lesions were noted in the bladder. A 6 french ureteral catheter was used to cannulate the right ureteral orifice. A gentle retrograde was obtained and the findings are noted above. We then injected contrast into the right nephrostomy tube and noted a 3cm strictured area of the proximal ureter. We then removed the cystoscope and inserted a 16 french foley  catheter.   We then repositioned the patient in the left lateral decubitus position and the flank was then prepped and draped usual sterile fashion.  A Veress needle was used to obtain pneumoperitoneum.  Once pneumoperitoneum was reestablished to 15 mmHg we then placed a 8 mm camera port lateral to the umbilicus at the latera; edge of rectus.  We then proceeded to place 3 more robotic ports. We then placed an assistant port. We then docked the robot. There was moderate colonic adhesions to the abdominal wall which took 30 minutes to remove.  We then started this dissection along the white line of Toldt.  We then reflected the colon medially.  We then kocherized the duodenum. We then identified the psoas muscle.  Once this was done we traced it down to the iliac vessels and identified the ureter. At this time we noted a devasculaized ureter.  Once we identified the gonadal vein and ureter were then traced this to the renal pelvis. We identified the 3cm area of stricture and excised it. The ureter proximal to the stricture was also devascularized to the renal pelvis. We elected not to perform a ureteroureterostomy due to likelihood of anastomosis failure.  We then placed a JP drain in the lower quadrant robot port. THis was secured with a 0 nylon.  We then removed our instruments, undocked the robot, and released the pneumoperitoneum. Once the specimen was removed we then closed the camera and assistant ports with 0 Vicryl in interrupted fashion.  The skin was then subcuticularly closed with 4-0 Monocryl.  We then placed Dermabond over all the incisions.  This included the procedure which resulted by the patient.  Complications: None  Condition: Stable, x-rayed, transferred to PACU.  Plan: Patient is to be admitted  for inpatient stay. The foley catheter will be removed in the morning. They will be started on a clear liquid diet POD#1

## 2018-06-20 ENCOUNTER — Ambulatory Visit: Payer: Self-pay | Admitting: *Deleted

## 2018-06-20 NOTE — Telephone Encounter (Signed)
Please advise 

## 2018-06-20 NOTE — Telephone Encounter (Signed)
Notified Shequeita,, [facility], of pt's triage status; she states that Dr Frederik Pear assistant just arrived and will be contacting the pt shortly.

## 2018-06-20 NOTE — Telephone Encounter (Signed)
Pt called stating that he had a nephrostomy placed due to a kidney stone complications; he had multiple procedures, last 06/16/2018; the pt says that he told the MD that he was having right leg stiffness which worsens when he lays down, or sits for an extended amount of time; he describes the pain as a dull ache; the pain is in his upper leg/hip to groin area; he has not been able to sleep due to pain; the pt likens it to a pulled muscle, but he has not done anything; the pain is rated 5 out of 10; he says that he stretches but is has limited motion due to the nephrostomy; he is able to urinate normally, and his tube drainage is lemonade color; he also denies fever, and says that the insertion site is unremarkable; he has alternated extra strength tylenol with ibuprofen as previously discussed with his MD; recommendations made per nurse triage protocol; he verbalizes understanding, and would like to see Dr Charlett Blake; the pt can be contacted at [phone]; will route to office for scheduling.   Reason for Disposition . [1] MODERATE pain (e.g., interferes with normal activities, limping) AND [2] present > 3 days  Answer Assessment - Initial Assessment Questions 1. ONSET: "When did the pain start?"      06/16/2018 2. LOCATION: "Where is the pain located?"      Right hip to upper peg 3. PAIN: "How bad is the pain?"    (Scale 1-10; or mild, moderate, severe)   -  MILD (1-3): doesn't interfere with normal activities    -  MODERATE (4-7): interferes with normal activities (e.g., work or school) or awakens from sleep, limping    -  SEVERE (8-10): excruciating pain, unable to do any normal activities, unable to walk     6 out of 10 4. WORK OR EXERCISE: "Has there been any recent work or exercise that involved this part of the body?"      no 5. CAUSE: "What do you think is causing the leg pain?"     Not usre 6. OTHER SYMPTOMS: "Do you have any other symptoms?" (e.g., chest pain, back pain, breathing difficulty,  swelling, rash, fever, numbness, weakness)     Itching for the past 3 months 7. PREGNANCY: "Is there any chance you are pregnant?" "When was your last menstrual period?"     n/a  Protocols used: LEG PAIN-A-AH

## 2018-06-20 NOTE — Telephone Encounter (Signed)
Make him an appointment to discuss options. Can add visits on Wednesday if we need it between 11 and 12

## 2018-06-21 ENCOUNTER — Encounter (HOSPITAL_COMMUNITY): Payer: Self-pay | Admitting: Interventional Radiology

## 2018-06-21 ENCOUNTER — Other Ambulatory Visit: Payer: Self-pay

## 2018-06-21 ENCOUNTER — Other Ambulatory Visit (HOSPITAL_COMMUNITY): Payer: Self-pay | Admitting: Interventional Radiology

## 2018-06-21 ENCOUNTER — Ambulatory Visit (HOSPITAL_COMMUNITY)
Admission: RE | Admit: 2018-06-21 | Discharge: 2018-06-21 | Disposition: A | Discharge status: Home / Self Care | Payer: Medicare Other | Source: Ambulatory Visit | Attending: Urology | Admitting: Urology

## 2018-06-21 DIAGNOSIS — N135 Crossing vessel and stricture of ureter without hydronephrosis: Secondary | ICD-10-CM

## 2018-06-21 DIAGNOSIS — Z436 Encounter for attention to other artificial openings of urinary tract: Secondary | ICD-10-CM | POA: Insufficient documentation

## 2018-06-21 HISTORY — PX: IR NEPHROSTOMY EXCHANGE RIGHT: IMG6070

## 2018-06-21 MED ORDER — LIDOCAINE HCL 1 % IJ SOLN
INTRAMUSCULAR | Status: DC | PRN
  Administered 2018-06-21: 5 mL

## 2018-06-21 MED ORDER — LIDOCAINE HCL 1 % IJ SOLN
INTRAMUSCULAR | Status: AC
  Filled 2018-06-21: qty 20

## 2018-06-21 MED ORDER — IOHEXOL 300 MG/ML  SOLN
50.0000 mL | Freq: Once | INTRAMUSCULAR | Status: AC | PRN
  Administered 2018-06-21: 5 mL

## 2018-06-21 NOTE — Telephone Encounter (Signed)
Made appt with PCP on Wednesday, patient made aware

## 2018-06-22 ENCOUNTER — Ambulatory Visit (INDEPENDENT_AMBULATORY_CARE_PROVIDER_SITE_OTHER): Payer: Medicare Other | Admitting: Family Medicine

## 2018-06-22 ENCOUNTER — Encounter: Payer: Self-pay | Admitting: Family Medicine

## 2018-06-22 VITALS — HR 66 | Temp 97.3°F | Ht 72.0 in | Wt 210.0 lb

## 2018-06-22 DIAGNOSIS — E559 Vitamin D deficiency, unspecified: Secondary | ICD-10-CM

## 2018-06-22 DIAGNOSIS — I10 Essential (primary) hypertension: Secondary | ICD-10-CM

## 2018-06-22 DIAGNOSIS — M109 Gout, unspecified: Secondary | ICD-10-CM | POA: Diagnosis not present

## 2018-06-22 DIAGNOSIS — E782 Mixed hyperlipidemia: Secondary | ICD-10-CM

## 2018-06-22 DIAGNOSIS — R739 Hyperglycemia, unspecified: Secondary | ICD-10-CM

## 2018-06-22 DIAGNOSIS — R350 Frequency of micturition: Secondary | ICD-10-CM

## 2018-06-22 DIAGNOSIS — M25551 Pain in right hip: Secondary | ICD-10-CM | POA: Diagnosis not present

## 2018-06-22 DIAGNOSIS — N289 Disorder of kidney and ureter, unspecified: Secondary | ICD-10-CM

## 2018-06-22 MED ORDER — KETOCONAZOLE 2 % EX CREA: 1.0000 "application " | TOPICAL_CREAM | Freq: Every day | CUTANEOUS | 1 refills | Status: DC

## 2018-06-22 MED ORDER — FLUOXETINE HCL 10 MG PO TABS: 10.0000 mg | ORAL_TABLET | Freq: Two times a day (BID) | ORAL | 3 refills | Status: DC

## 2018-06-22 NOTE — Progress Notes (Signed)
Virtual Visit via Video Note  I connected with David [name] on 06/22/18 at 11:00 AM EDT by a video enabled telemedicine application and verified that I am speaking with the correct person using two identifiers.  Location: Patient: home Provider: home   I discussed the limitations of evaluation and management by telemedicine and the availability of in person appointments. The patient expressed understanding and agreed to proceed. David [name] was able to get the patient set up on video visit    Subjective:    Patient ID: David [name], male    DOB: 1945-12-06, 73 y.o.   MRN: [id number]  Chief Complaint  Patient presents with  . right leg stiffness    HPI Patient is in today for follow-up on chronic medical concerns.  He has been working with urology and after a recent procedure they determined that his right kidney was only at 21% of capacity and due to the poor shape of his ureter it is agreed that the kidney needs to be removed.  He feels he is going to proceed but is going to get a second opinion at [facility] first.  He denies any dysuria but does note some urinary frequency and urgency.  Notes some increased right hip pain with pain traveling down the medial aspect of his right thigh to behind his knee.  Worse with ambulation better with rest.  No recent fall or injury.  No change in bowel habits. Denies CP/palp/SOB/HA/congestion/fevers/GI or GU c/o. Taking meds as prescribed  Past Medical History:  Diagnosis Date  . Abnormal liver function 03/18/2013  . Adenoma of right adrenal gland 07/11/2002   2.4cm , noted on CT ABD  . Anxiety   . Arthritis of both knees 03/26/2016  . Astigmatism   . Atrial fibrillation ([place])    x1 successful cardioversions  . Back pain   . BCC (basal cell carcinoma of skin)    under right eye and right ear  . Cataract 09/29/2016  . Depression   . Diverticulitis   . Diverticulosis   . Fatty liver   . GERD (gastroesophageal reflux disease)    . Gout   . Hearing loss of both ears 03/26/2016  . History of chronic prostatitis    started at age 4  . History of kidney stones   . Hyperglycemia 03/29/2016  . Hyperlipidemia   . Hypertension   . IBS (irritable bowel syndrome) 03/18/2013  . Incomplete right bundle branch block (RBBB)   . Internal hemorrhoids   . Kidney lesion 06/07/2015   Right midportion, 1.1x1.1 cm hyperechoic, noted on [place] ABD  . LAFB (left anterior fascicular block)   . Lipoma of axilla 09/2016  . Liver lesion, right lobe 06/07/2015   2.5x2.4x2.4 cm hypoechoic lesion posterior aspect, noted on [place] ABD  . Low back pain 03/26/2016  . LVH (left ventricular hypertrophy) 08/06/2017   Moderate, noted on ECHO  . Medicare annual wellness visit, subsequent 03/12/2014  . Muscle cramps   . Nocturia   . OA (osteoarthritis)    Back, Hands, Neck  . Obesity 11/25/2007   Qualifier: Diagnosis of  By: Lenna Gilford MD, Deborra Medina   . Other malaise and fatigue 03/13/2013  . Penis symptom or sign    PENIS RED NO DRAINAGE   . Premature ventricular complex   . Preventative health care 03/07/2015  . Renal insufficiency   . Scoliosis    Upper thoracic and lumbar  . Vitamin D deficiency    resolved  Past Surgical History:  Procedure Laterality Date  . CARDIOVERSION  07/31/2017  . CATARACT EXTRACTION, BILATERAL    . COLON SURGERY  2009   segmental sigmoid resection  . COLONOSCOPY    . CYSTOSCOPY W/ RETROGRADES Right 06/07/2018   Procedure: CYSTOSCOPY WITH RETROGRADE PYELOGRAM, uphrostogram;  Surgeon: Cleon Gustin, MD;  Location: [facility];  Service: Urology;  Laterality: Right;  . CYSTOSCOPY WITH URETEROSCOPY AND STENT PLACEMENT Right 02/28/2018   Procedure: CYSTOSCOPY WITH URETEROSCOPY Concha Se;  Surgeon: Kathie Rhodes, MD;  Location: [facility];  Service: Urology;  Laterality: Right;  . CYSTOSCOPY/URETEROSCOPY/HOLMIUM LASER/STENT PLACEMENT Right 11/29/2017   Procedure: CYSTOSCOPY/RETROGRADE/URETEROSCOPY/HOLMIUM  LASER/STENT PLACEMENT;  Surgeon: Kathie Rhodes, MD;  Location: [facility];  Service: Urology;  Laterality: Right;  . EYE SURGERY Bilateral 01/12/2017   cataract removal  . HOLMIUM LASER APPLICATION Right 4/76/5465   Procedure: HOLMIUM LASER APPLICATION;  Surgeon: Kathie Rhodes, MD;  Location: [facility];  Service: Urology;  Laterality: Right;  . IR BALLOON DILATION URETERAL STRICTURE RIGHT  03/14/2018  . IR NEPHRO TUBE REMOV/FL  03/17/2018  . IR NEPHROSTOGRAM RIGHT THRU EXISTING ACCESS  03/17/2018  . IR NEPHROSTOMY EXCHANGE RIGHT  03/14/2018  . IR NEPHROSTOMY EXCHANGE RIGHT  06/21/2018  . IR NEPHROSTOMY PLACEMENT RIGHT  03/02/2018  . IR NEPHROSTOMY PLACEMENT RIGHT  04/20/2018  . IR URETERAL STENT PLACEMENT EXISTING ACCESS RIGHT  03/14/2018  . NOSE SURGERY     Submucous resection age 73  . NUCLEAR STRESS TEST  06/03/2009  . ROBOT ASSISTED PYELOPLASTY Right 06/07/2018   Procedure: attempted XI ROBOTIC ASSISTED PYELOPLASTY, lysis of adhesions;  Surgeon: Cleon Gustin, MD;  Location: [facility];  Service: Urology;  Laterality: Right;  3 HRS  . SKIN BIOPSY    . URETEROSCOPY WITH HOLMIUM LASER LITHOTRIPSY Right 09/24/2017   Procedure: CYSTOSCOPY, URETEROSCOPY WITH HOLMIUM LASER LITHOTRIPSY, STENT PLACEMENT;  Surgeon: Kathie Rhodes, MD;  Location: [facility];  Service: Urology;  Laterality: Right;    Family History  Problem Relation Age of Onset  . Heart disease Mother   . Hypertension Mother   . Stroke Mother   . Colon cancer Mother   . Breast cancer Mother   . Heart disease Father        pacemaker  . Aortic aneurysm Father   . Hypertension Father   . Heart disease Sister   . Atrial fibrillation Sister   . Obesity Sister   . Sleep apnea Sister   . Heart attack Brother   . Other Brother        muscle disease  . Arthritis Brother   . Stroke Brother   . Atrial fibrillation Brother   . Cancer Maternal Grandmother        ?  Marland Kitchen Heart attack Maternal Grandmother   .  Diabetes Maternal Grandmother   . Cancer Maternal Grandfather        hodgin's lymphoma  . Heart attack Paternal Grandmother   . Anxiety disorder Paternal Grandmother   . Pneumonia Paternal Grandfather   . Heart attack Brother   . Atrial fibrillation Brother   . Atrial fibrillation Brother   . Heart attack Brother   . Hypertension Brother   . Hyperlipidemia Brother   . Heart attack Brother   . Other Brother        heart valve operation  . Atrial fibrillation Brother     Social History   Socioeconomic History  . Marital status: Married    Spouse name:  Not on file  . Number of children: 3  . Years of education: Not on file  . Highest education level: Not on file  Occupational History  . Occupation: Nurse, adult: [employer]  . Financial resource strain: Not on file  . Food insecurity:    Worry: Not on file    Inability: Not on file  . Transportation needs:    Medical: Not on file    Non-medical: Not on file  Tobacco Use  . Smoking status: Former Smoker    Packs/day: 1.00    Years: 6.00    Pack years: 6.00    Types: Cigarettes    Start date: 01/12/1969  . Smokeless tobacco: Never Used  . Tobacco comment: [id number]  Substance and Sexual Activity  . Alcohol use: Not Currently    Comment: NONE SINCE July 18 2017  . Drug use: No  . Sexual activity: Yes  Lifestyle  . Physical activity:    Days per week: Not on file    Minutes per session: Not on file  . Stress: Not on file  Relationships  . Social connections:    Talks on phone: Not on file    Gets together: Not on file    Attends religious service: Not on file    Active member of club or organization: Not on file    Attends meetings of clubs or organizations: Not on file    Relationship status: Not on file  . Intimate partner violence:    Fear of current or ex partner: Not on file    Emotionally abused: Not on file    Physically abused: Not on file    Forced sexual activity: Not on file  Other  Topics Concern  . Not on file  Social History Narrative   The patient is married for the second time, he has 3 sons.   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   2 alcoholic beverages most days.   1 caffeinated beverage daily   No drug use no current tobacco use he is a prior smoker   12/24/2016    Outpatient Medications Prior to Visit  Medication Sig Dispense Refill  . acetaminophen (TYLENOL) 500 MG tablet Take 1,000 mg by mouth every 6 (six) hours as needed (pain).     Marland Kitchen atenolol (TENORMIN) 25 MG tablet TAKE ONE TABLET BY MOUTH TWICE A DAY (Patient taking differently: Take 25 mg by mouth 2 (two) times daily. ) 180 tablet 2  . bisacodyl (DULCOLAX) 5 MG EC tablet Take 5 mg by mouth daily as needed for moderate constipation.    . carboxymethylcellulose (REFRESH PLUS) 0.5 % SOLN Place 1 drop into both eyes daily.    . clotrimazole-betamethasone (LOTRISONE) cream Apply 1 application topically 2 (two) times daily.  30 g 0  . Colchicine 0.6 MG CAPS 1 tab po bid 60 capsule 0  . cyclobenzaprine (FLEXERIL) 5 MG tablet Take 1 tablet (5 mg total) by mouth 3 (three) times daily as needed for muscle spasms. 30 tablet 2  . fluconazole (DIFLUCAN) 150 MG tablet Take 1 tablet (150 mg total) by mouth daily. 3 tablet 0  . fluticasone (FLONASE) 50 MCG/ACT nasal spray Place 2 sprays into the nose as needed. For congestion    . HYDROcodone-acetaminophen (NORCO) 5-325 MG tablet Take 1-2 tablets by mouth every 6 (six) hours as needed for moderate pain or severe pain. 20 tablet 0  . LORazepam (ATIVAN) 1 MG tablet Take 1  tablet (1 mg total) by mouth 2 (two) times daily as needed for anxiety or sedation. (Patient taking differently: Take 0.5 mg by mouth 2 (two) times daily. ) 180 tablet 1  . Menthol, Topical Analgesic, (ICY HOT BACK EX) Apply 1 application topically daily as needed (back pain).    Marland Kitchen omeprazole (PRILOSEC) 20 MG capsule Take 20 mg by mouth daily as needed (acid reflux).     Marland Kitchen FLUoxetine (PROZAC) 10  MG tablet Take 0.5 tablets (5 mg total) by mouth 2 (two) times a day. 30 tablet 3   No facility-administered medications prior to visit.     Allergies  Allergen Reactions  . Cefaclor Hives  . Cephalosporins Hives  . Penicillins Rash    Mild maculopapular rash Has patient had a PCN reaction causing immediate rash, facial/tongue/throat swelling, SOB or lightheadedness with hypotension: No Has patient had a PCN reaction causing severe rash involving mucus membranes or skin necrosis: No Has patient had a PCN reaction that required hospitalization: No Has patient had a PCN reaction occurring within the last 10 years: No If all of the above answers are "NO", then may proceed with Cephalosporin use.     Review of Systems  Constitutional: Negative for fever and malaise/fatigue.  HENT: Negative for congestion.   Eyes: Negative for blurred vision.  Respiratory: Negative for shortness of breath.   Cardiovascular: Negative for chest pain, palpitations and leg swelling.  Gastrointestinal: Negative for abdominal pain, blood in stool and nausea.  Genitourinary: Positive for frequency and urgency. Negative for dysuria, flank pain and hematuria.  Musculoskeletal: Positive for joint pain. Negative for falls.  Skin: Negative for rash.  Neurological: Negative for dizziness, loss of consciousness and headaches.  Endo/Heme/Allergies: Negative for environmental allergies.  Psychiatric/Behavioral: Negative for depression. The patient is not nervous/anxious.        Objective:    Physical Exam Constitutional:      Appearance: Normal appearance.  HENT:     Head: Normocephalic and atraumatic.  Eyes:     General:        Right eye: No discharge.        Left eye: Discharge present. Pulmonary:     Effort: Pulmonary effort is normal.  Neurological:     Mental Status: He is alert and oriented to person, place, and time.  Psychiatric:        Mood and Affect: Mood normal.        Behavior: Behavior  normal.     Pulse 66   Temp (!) 97.3 F (36.3 C) (Oral)   Ht 6' (1.829 m)   Wt 210 lb (95.3 kg)   BMI 28.48 kg/m  Wt Readings from Last 3 Encounters:  06/22/18 210 lb (95.3 kg)  06/07/18 220 lb 2 oz (99.8 kg)  06/01/18 220 lb 2 oz (99.8 kg)    Diabetic Foot Exam - Simple   No data filed     Lab Results  Component Value Date   WBC 5.5 06/01/2018   HGB 12.5 (L) 06/08/2018   HCT 39.5 06/08/2018   PLT 205 06/01/2018   GLUCOSE 152 (H) 06/08/2018   CHOL 217 (H) 04/13/2018   TRIG 116.0 04/13/2018   HDL 33.20 (L) 04/13/2018   LDLDIRECT 168.5 03/29/2006   LDLCALC 160 (H) 04/13/2018   ALT 16 04/13/2018   AST 16 04/13/2018   NA 133 (L) 06/08/2018   K 4.5 06/08/2018   CL 102 06/08/2018   CREATININE 1.47 (H) 06/08/2018   BUN 16 06/08/2018  CO2 23 06/08/2018   TSH 3.05 10/08/2017   PSA 3.36 11/15/2017   INR 1.0 04/20/2018   HGBA1C 5.5 04/13/2018    Lab Results  Component Value Date   TSH 3.05 10/08/2017   Lab Results  Component Value Date   WBC 5.5 06/01/2018   HGB 12.5 (L) 06/08/2018   HCT 39.5 06/08/2018   MCV 92.9 06/01/2018   PLT 205 06/01/2018   Lab Results  Component Value Date   NA 133 (L) 06/08/2018   K 4.5 06/08/2018   CO2 23 06/08/2018   GLUCOSE 152 (H) 06/08/2018   BUN 16 06/08/2018   CREATININE 1.47 (H) 06/08/2018   BILITOT 0.9 04/13/2018   ALKPHOS 81 04/13/2018   AST 16 04/13/2018   ALT 16 04/13/2018   PROT 6.7 04/13/2018   ALBUMIN 4.3 04/13/2018   CALCIUM 9.4 06/08/2018   ANIONGAP 8 06/08/2018   GFR 43.85 (L) 04/13/2018   Lab Results  Component Value Date   CHOL 217 (H) 04/13/2018   Lab Results  Component Value Date   HDL 33.20 (L) 04/13/2018   Lab Results  Component Value Date   LDLCALC 160 (H) 04/13/2018   Lab Results  Component Value Date   TRIG 116.0 04/13/2018   Lab Results  Component Value Date   CHOLHDL 7 04/13/2018   Lab Results  Component Value Date   HGBA1C 5.5 04/13/2018       Assessment & Plan:    Problem List Items Addressed This Visit    Hyperlipidemia, mixed   Relevant Orders   Lipid panel   Gout - Primary    Hydrate and check uric acid level      Relevant Orders   Uric acid   Renal insufficiency    Hydrate and monitor      Relevant Orders   CBC with Differential/Platelet   Comprehensive metabolic panel   Urine Culture   Urinalysis   PSA   Hyperglycemia    hgba1c acceptable, minimize simple carbs. Increase exercise as tolerated.       Right hip pain    With pain radiating down medial aspect of right thigh, to back of knee and posterior calf. Iliopsoas muscle will check Xray and refer for some physical therapy      Relevant Orders   Ambulatory referral to Physical Therapy   DG Hip Unilat W OR W/O Pelvis 2-3 Views Right    Other Visit Diagnoses    Frequency of urination       Relevant Orders   Urine Culture   Urinalysis   PSA   Hypertension, unspecified type       Relevant Orders   CBC with Differential/Platelet   Comprehensive metabolic panel   Vitamin D deficiency       Relevant Orders   VITAMIN D 25 Hydroxy (Vit-D Deficiency, Fractures)      I have changed David [name] "Fred"'s FLUoxetine. I am also having him start on ketoconazole. Additionally, I am having him maintain his fluticasone, atenolol, carboxymethylcellulose, (Menthol, Topical Analgesic, (ICY HOT BACK EX)), omeprazole, acetaminophen, bisacodyl, LORazepam, Lotrisone, cyclobenzaprine, fluconazole, Colchicine, and HYDROcodone-acetaminophen.  Meds ordered this encounter  Medications  . FLUoxetine (PROZAC) 10 MG tablet    Sig: Take 1 tablet (10 mg total) by mouth 2 (two) times a day.    Dispense:  60 tablet    Refill:  3  . ketoconazole (NIZORAL) 2 % cream    Sig: Apply 1 application topically daily.    Dispense:  60 g    Refill:  1    I discussed the assessment and treatment plan with the patient. The patient was provided an opportunity to ask questions and all were answered. The  patient agreed with the plan and demonstrated an understanding of the instructions.   The patient was advised to call back or seek an in-person evaluation if the symptoms worsen or if the condition fails to improve as anticipated.  I provided 25 minutes of non-face-to-face time during this encounter.   Penni Homans, MD

## 2018-06-22 NOTE — Assessment & Plan Note (Signed)
Hydrate and monitor 

## 2018-06-22 NOTE — Assessment & Plan Note (Signed)
With pain radiating down medial aspect of right thigh, to back of knee and posterior calf. Iliopsoas muscle will check Xray and refer for some physical therapy

## 2018-06-22 NOTE — Assessment & Plan Note (Signed)
Hydrate and check uric acid level

## 2018-06-22 NOTE — Assessment & Plan Note (Signed)
hgba1c acceptable, minimize simple carbs. Increase exercise as tolerated.  

## 2018-06-29 ENCOUNTER — Telehealth: Payer: Self-pay

## 2018-06-29 ENCOUNTER — Ambulatory Visit: Payer: Medicare Other | Admitting: Physical Therapy

## 2018-06-29 NOTE — Telephone Encounter (Signed)
Copied from [place] [id number]. Topic: General - Inquiry >> Jun 29, 2018  8:10 AM [name], NT wrote: Patient calling in seeking advice. Patient states he is experiencing some skin irritation on the left side of his scrotum. States he already spoke with his dermatologist, however it has not gone away. Patient is wondering which medication to take in regards to this to see how to get rid of it. Patient has taken fulconase ointment and diflucan before. Call back is [phone].

## 2018-06-29 NOTE — Telephone Encounter (Signed)
Can try OTC clotrimazole bid for 14 d. Needs appt if no better.

## 2018-06-29 NOTE — Telephone Encounter (Signed)
Please advise in PCP absence.  

## 2018-06-30 ENCOUNTER — Telehealth: Payer: Self-pay

## 2018-06-30 NOTE — Telephone Encounter (Signed)
Needing appt

## 2018-06-30 NOTE — Telephone Encounter (Signed)
Spoke w/ Pt- informed of recommendations. Pt verbalized understanding.  

## 2018-07-01 ENCOUNTER — Other Ambulatory Visit (INDEPENDENT_AMBULATORY_CARE_PROVIDER_SITE_OTHER): Payer: Medicare Other

## 2018-07-01 ENCOUNTER — Other Ambulatory Visit: Payer: Self-pay

## 2018-07-01 ENCOUNTER — Ambulatory Visit (HOSPITAL_BASED_OUTPATIENT_CLINIC_OR_DEPARTMENT_OTHER)
Admission: RE | Admit: 2018-07-01 | Discharge: 2018-07-01 | Disposition: A | Discharge status: Home / Self Care | Payer: Medicare Other | Source: Ambulatory Visit | Attending: Family Medicine | Admitting: Family Medicine

## 2018-07-01 DIAGNOSIS — M25551 Pain in right hip: Secondary | ICD-10-CM | POA: Insufficient documentation

## 2018-07-01 DIAGNOSIS — I10 Essential (primary) hypertension: Secondary | ICD-10-CM | POA: Diagnosis not present

## 2018-07-01 DIAGNOSIS — R350 Frequency of micturition: Secondary | ICD-10-CM

## 2018-07-01 DIAGNOSIS — E782 Mixed hyperlipidemia: Secondary | ICD-10-CM

## 2018-07-01 DIAGNOSIS — M109 Gout, unspecified: Secondary | ICD-10-CM

## 2018-07-01 DIAGNOSIS — N289 Disorder of kidney and ureter, unspecified: Secondary | ICD-10-CM

## 2018-07-01 DIAGNOSIS — E559 Vitamin D deficiency, unspecified: Secondary | ICD-10-CM

## 2018-07-01 NOTE — Addendum Note (Signed)
Addended by: Kelle Darting A on: 07/01/2018 02:46 PM   Modules accepted: Orders

## 2018-07-02 LAB — CBC WITH DIFFERENTIAL/PLATELET
Absolute Monocytes: 503 cells/uL (ref 200–950)
Basophils Absolute: 47 cells/uL (ref 0–200)
Basophils Relative: 0.7 %
Eosinophils Absolute: 181 cells/uL (ref 15–500)
Eosinophils Relative: 2.7 %
HCT: 41.2 % (ref 38.5–50.0)
Hemoglobin: 13.9 g/dL (ref 13.2–17.1)
Lymphs Abs: 1729 cells/uL (ref 850–3900)
MCH: 30 pg (ref 27.0–33.0)
MCHC: 33.7 g/dL (ref 32.0–36.0)
MCV: 89 fL (ref 80.0–100.0)
MPV: 10.9 fL (ref 7.5–12.5)
Monocytes Relative: 7.5 %
Neutro Abs: 4241 cells/uL (ref 1500–7800)
Neutrophils Relative %: 63.3 %
Platelets: 286 10*3/uL (ref 140–400)
RBC: 4.63 10*6/uL (ref 4.20–5.80)
RDW: 14.5 % (ref 11.0–15.0)
Total Lymphocyte: 25.8 %
WBC: 6.7 10*3/uL (ref 3.8–10.8)

## 2018-07-02 LAB — LIPID PANEL
Cholesterol: 242 mg/dL — ABNORMAL HIGH (ref <200)
HDL: 36 mg/dL — ABNORMAL LOW (ref >=40)
LDL Cholesterol (Calc): 179 mg/dL (calc) — ABNORMAL HIGH
Non-HDL Cholesterol (Calc): 206 mg/dL (calc) — ABNORMAL HIGH (ref <130)
Total CHOL/HDL Ratio: 6.7 (calc) — ABNORMAL HIGH (ref <5.0)
Triglycerides: 132 mg/dL (ref <150)

## 2018-07-02 LAB — TSH: TSH: 1.51 mIU/L (ref 0.40–4.50)

## 2018-07-02 LAB — COMPREHENSIVE METABOLIC PANEL
AG Ratio: 1.7 (calc) (ref 1.0–2.5)
ALT: 15 U/L (ref 9–46)
AST: 16 U/L (ref 10–35)
Albumin: 4.3 g/dL (ref 3.6–5.1)
Alkaline phosphatase (APISO): 76 U/L (ref 35–144)
BUN/Creatinine Ratio: 11 (calc) (ref 6–22)
BUN: 19 mg/dL (ref 7–25)
CO2: 26 mmol/L (ref 20–32)
Calcium: 10.6 mg/dL — ABNORMAL HIGH (ref 8.6–10.3)
Chloride: 105 mmol/L (ref 98–110)
Creat: 1.67 mg/dL — ABNORMAL HIGH (ref 0.70–1.18)
Globulin: 2.6 g/dL (calc) (ref 1.9–3.7)
Glucose, Bld: 105 mg/dL — ABNORMAL HIGH (ref 65–99)
Potassium: 4.3 mmol/L (ref 3.5–5.3)
Sodium: 140 mmol/L (ref 135–146)
Total Bilirubin: 0.7 mg/dL (ref 0.2–1.2)
Total Protein: 6.9 g/dL (ref 6.1–8.1)

## 2018-07-02 LAB — URIC ACID: Uric Acid, Serum: 7.9 mg/dL (ref 4.0–8.0)

## 2018-07-02 LAB — VITAMIN D 25 HYDROXY (VIT D DEFICIENCY, FRACTURES): Vit D, 25-Hydroxy: 34 ng/mL (ref 30–100)

## 2018-07-02 LAB — PSA: PSA: 4.4 ng/mL — ABNORMAL HIGH (ref <=4.0)

## 2018-07-03 LAB — URINALYSIS
Bilirubin Urine: NEGATIVE
Glucose, UA: NEGATIVE
Hgb urine dipstick: NEGATIVE
Ketones, ur: NEGATIVE
Leukocytes,Ua: NEGATIVE
Nitrite: NEGATIVE
Specific Gravity, Urine: 1.03 (ref 1.001–1.03)
pH: 5 (ref 5.0–8.0)

## 2018-07-03 LAB — URINE CULTURE
MICRO NUMBER:: 588449
Result:: NO GROWTH
SPECIMEN QUALITY:: ADEQUATE

## 2018-07-06 ENCOUNTER — Other Ambulatory Visit: Payer: Self-pay

## 2018-07-06 ENCOUNTER — Encounter: Payer: Self-pay | Admitting: Physical Therapy

## 2018-07-06 ENCOUNTER — Ambulatory Visit: Payer: Medicare Other | Attending: Family Medicine | Admitting: Physical Therapy

## 2018-07-06 VITALS — BP 140/82 | HR 60

## 2018-07-06 DIAGNOSIS — R29898 Other symptoms and signs involving the musculoskeletal system: Secondary | ICD-10-CM

## 2018-07-06 DIAGNOSIS — M25551 Pain in right hip: Secondary | ICD-10-CM | POA: Insufficient documentation

## 2018-07-06 NOTE — Therapy (Signed)
[facility] [address]  [place], [place], [address] Phone: [phone]   Fax:  [phone]  Physical Therapy Evaluation  Patient Details  Name: David [name] MRN: [id number] Date of Birth: [birth year] Referring Provider (PT): Penni Homans, MD   Encounter Date: 07/06/2018  PT End of Session - 07/06/18 1201    Visit Number  1    Number of Visits  7    Date for PT Re-Evaluation  08/17/18    Authorization Type  [employer] Medicare    PT Start Time  1052    PT Stop Time  1144    PT Time Calculation (min)  52 min    Activity Tolerance  Patient tolerated treatment well    Behavior During Therapy  [facility] for tasks assessed/performed       Past Medical History:  Diagnosis Date  . Abnormal liver function 03/18/2013  . Adenoma of right adrenal gland 07/11/2002   2.4cm , noted on CT ABD  . Anxiety   . Arthritis of both knees 03/26/2016  . Astigmatism   . Atrial fibrillation ([place])    x1 successful cardioversions  . Back pain   . BCC (basal cell carcinoma of skin)    under right eye and right ear  . Cataract 09/29/2016  . Depression   . Diverticulitis   . Diverticulosis   . Fatty liver   . GERD (gastroesophageal reflux disease)   . Gout   . Hearing loss of both ears 03/26/2016  . History of chronic prostatitis    started at age 79  . History of kidney stones   . Hyperglycemia 03/29/2016  . Hyperlipidemia   . Hypertension   . IBS (irritable bowel syndrome) 03/18/2013  . Incomplete right bundle branch block (RBBB)   . Internal hemorrhoids   . Kidney lesion 06/07/2015   Right midportion, 1.1x1.1 cm hyperechoic, noted on [place] ABD  . LAFB (left anterior fascicular block)   . Lipoma of axilla 09/2016  . Liver lesion, right lobe 06/07/2015   2.5x2.4x2.4 cm hypoechoic lesion posterior aspect, noted on [place] ABD  . Low back pain 03/26/2016  . LVH (left ventricular hypertrophy) 08/06/2017   Moderate, noted on ECHO  . Medicare  annual wellness visit, subsequent 03/12/2014  . Muscle cramps   . Nocturia   . OA (osteoarthritis)    Back, Hands, Neck  . Obesity 11/25/2007   Qualifier: Diagnosis of  By: Lenna Gilford MD, Deborra Medina   . Other malaise and fatigue 03/13/2013  . Penis symptom or sign    PENIS RED NO DRAINAGE   . Premature ventricular complex   . Preventative health care 03/07/2015  . Renal insufficiency   . Scoliosis    Upper thoracic and lumbar  . Vitamin D deficiency    resolved    Past Surgical History:  Procedure Laterality Date  . CARDIOVERSION  07/31/2017  . CATARACT EXTRACTION, BILATERAL    . COLON SURGERY  2009   segmental sigmoid resection  . COLONOSCOPY    . CYSTOSCOPY W/ RETROGRADES Right 06/07/2018   Procedure: CYSTOSCOPY WITH RETROGRADE PYELOGRAM, uphrostogram;  Surgeon: Cleon Gustin, MD;  Location: [facility];  Service: Urology;  Laterality: Right;  . CYSTOSCOPY WITH URETEROSCOPY AND STENT PLACEMENT Right 02/28/2018   Procedure: CYSTOSCOPY WITH URETEROSCOPY [place];  Surgeon: Kathie Rhodes, MD;  Location: [facility];  Service: Urology;  Laterality: Right;  . CYSTOSCOPY/URETEROSCOPY/HOLMIUM LASER/STENT PLACEMENT Right 11/29/2017   Procedure: CYSTOSCOPY/RETROGRADE/URETEROSCOPY/HOLMIUM LASER/STENT  PLACEMENT;  Surgeon: Kathie Rhodes, MD;  Location: [facility];  Service: Urology;  Laterality: Right;  . EYE SURGERY Bilateral 01/12/2017   cataract removal  . HOLMIUM LASER APPLICATION Right 0/35/4656   Procedure: HOLMIUM LASER APPLICATION;  Surgeon: Kathie Rhodes, MD;  Location: [facility];  Service: Urology;  Laterality: Right;  . IR BALLOON DILATION URETERAL STRICTURE RIGHT  03/14/2018  . IR NEPHRO TUBE REMOV/FL  03/17/2018  . IR NEPHROSTOGRAM RIGHT THRU EXISTING ACCESS  03/17/2018  . IR NEPHROSTOMY EXCHANGE RIGHT  03/14/2018  . IR NEPHROSTOMY EXCHANGE RIGHT  06/21/2018  . IR NEPHROSTOMY PLACEMENT RIGHT  03/02/2018  . IR NEPHROSTOMY PLACEMENT RIGHT  04/20/2018  . IR URETERAL  STENT PLACEMENT EXISTING ACCESS RIGHT  03/14/2018  . NOSE SURGERY     Submucous resection age 61  . NUCLEAR STRESS TEST  06/03/2009  . ROBOT ASSISTED PYELOPLASTY Right 06/07/2018   Procedure: attempted XI ROBOTIC ASSISTED PYELOPLASTY, lysis of adhesions;  Surgeon: Cleon Gustin, MD;  Location: [facility];  Service: Urology;  Laterality: Right;  3 HRS  . SKIN BIOPSY    . URETEROSCOPY WITH HOLMIUM LASER LITHOTRIPSY Right 09/24/2017   Procedure: CYSTOSCOPY, URETEROSCOPY WITH HOLMIUM LASER LITHOTRIPSY, STENT PLACEMENT;  Surgeon: Kathie Rhodes, MD;  Location: [facility];  Service: Urology;  Laterality: Right;    Vitals:   07/06/18 1054  BP: 140/82  Pulse: 60  SpO2: 96%     Subjective Assessment - 07/06/18 1054    Subjective  Patient reports R hip pain started in 2002- was able to manage it with stretching and Aspirin. Pain has gotten progressively worse since then. Pain jumps from lateral, anterior, and posterior R hip. Also some pain in the R LB. Reports intermittent NT down to R lateral calf. Worse with prolonged standing and sitting, sleeping on L sidelying. Better with walking and when going up stairs with weight only on his toes. Notes that his balance has worsened but denies falls. Has had several procedures in the last several months, with next procedure in July for kidney removal.    Pertinent History  scoliosis, renal insufficiency, PVC, OA, L ventricular hypertrophy, LBP, L anterior fascicular block, RBBB, HLD, HTN, hyperglycemia, B hearing loss, gout, GERD, a-fib, B knee arthritis, cardioversion 2019    Limitations  Sitting;Lifting;Standing;House hold activities    How long can you sit comfortably?  1 hour    How long can you stand comfortably?  20-30 min    How long can you walk comfortably?  unlimited    Diagnostic tests  07/01/18 hip xray: No acute abnormality. Mild bilateral hip joint space narrowing. LOWER lumbar spine degenerative changes.    Patient Stated Goals   "stretch the muscle and get back to running"    Currently in Pain?  Yes    Pain Score  1     Pain Location  Hip    Pain Orientation  Right    Pain Descriptors / Indicators  --   stiffness   Pain Type  Chronic pain         [facility] PT Assessment - 07/06/18 1114      Assessment   Medical Diagnosis  R hip pain    Referring Provider (PT)  Penni Homans, MD    Onset Date/Surgical Date  --   2002   Next MD Visit  08/08/18    Prior Therapy  yes      Precautions   Precautions  --   nephrostomy tube on  R LB, B hearing loss     Balance Screen   Has the patient fallen in the past 6 months  No    Has the patient had a decrease in activity level because of a fear of falling?   No    Is the patient reluctant to leave their home because of a fear of falling?   No      Home Environment   Living Environment  Private residence    Living Arrangements  Spouse/significant other    Available Help at Discharge  Family    Type of [place] to enter    Entrance Stairs-Number of Steps  7    Entrance Stairs-Rails  Right;Left;Can reach both    [place] of Steps  15, [place] - single point;Wheelchair - [employer] - standard      Prior Function   Level of Independence  Independent    Vocation  Retired    Leisure  Regulatory affairs officer   Overall Cognitive Status  Within Functional Limits for tasks assessed      Observation/Other Assessments   Observations  R nephrostomy tube covered with gauze in R LB    Focus on Therapeutic Outcomes (FOTO)   Hip: 68 (32% limited, 29% predicted)      Sensation   Light Touch  Appears Intact      Coordination   Gross Motor Movements are Fluid and Coordinated  Yes      Posture/Postural Control   Posture/Postural Control  Postural limitations    Postural Limitations  Rounded Shoulders;Forward head;Posterior pelvic tilt       ROM / Strength   AROM / PROM / Strength  AROM;Strength      AROM   AROM Assessment Site  Lumbar;Hip    Right/Left Hip  Left;Right    Right Hip External Rotation   53    Right Hip Internal Rotation   45    Left Hip External Rotation   38    Left Hip Internal Rotation   45    Lumbar Flexion  mid shin    Lumbar Extension  WFL    Lumbar - Right Side Bend  jt line    Lumbar - Left Side Bend  jt line    Lumbar - Right Rotation  WFL    Lumbar - Left Rotation  [facility]      Strength   Strength Assessment Site  Hip;Knee;Ankle    Right/Left Hip  Right;Left    Right Hip Flexion  4+/5    Right Hip ABduction  4+/5    Right Hip ADduction  4+/5    Left Hip Flexion  4+/5    Left Hip ABduction  4+/5    Left Hip ADduction  4+/5    Right/Left Knee  Right;Left    Right Knee Flexion  5/5    Right Knee Extension  4+/5    Left Knee Flexion  5/5    Left Knee Extension  4+/5    Right/Left Ankle  Right;Left    Right Ankle Dorsiflexion  4/5    Right Ankle Plantar Flexion  4/5    Left Ankle Dorsiflexion  4+/5    Left Ankle Plantar Flexion  4/5      Palpation   Spinal mobility  thoracic scoliosis  Palpation comment  no TTP; atrophy of B glute max/med; increased muscle tone along R lumbar paraspinals      Ambulation/Gait   Assistive device  None    Gait Pattern  Step-through pattern;Decreased hip/knee flexion - right;Decreased stance time - right;Decreased step length - left;Wide base of support   mild side to side trunk lean   Ambulation Surface  Level;Indoor    Gait velocity  WFL                Objective measurements completed on examination: See above findings.              PT Education - 07/06/18 1201    Education Details  prognosis, POC, HEP    Person(s) Educated  Patient    Methods  Explanation;Demonstration;Tactile cues;Verbal cues;Handout    Comprehension  Verbalized understanding;Returned demonstration       PT Short Term Goals - 07/06/18 1213      PT  SHORT TERM GOAL #1   Title  Patient to be independent with HEP.    Time  3    Period  Weeks    Status  New    Target Date  07/27/18        PT Long Term Goals - 07/06/18 1214      PT LONG TERM GOAL #1   Title  Patient to be independent with advanced HEP.    Time  6    Period  Weeks    Status  New    Target Date  08/17/18      PT LONG TERM GOAL #2   Title  Patient to demonstrate B LE strength, including all planes of hip motion >=4+/5.    Time  6    Period  Weeks    Status  New    Target Date  08/17/18      PT LONG TERM GOAL #3   Title  Patient to report tolerance for 1 hour of standing without increase in pain.    Time  6    Period  Weeks    Status  New    Target Date  08/17/18      PT LONG TERM GOAL #4   Title  Patient to demonstrate stair climbing up/down 13 steps with 1 handrail as needed with good hip stability and no pain.    Time  6    Period  Weeks    Status  New      PT LONG TERM GOAL #5   Title  Patient to report 75% improvement in R hip and LBP.    Time  6    Period  Weeks    Status  New    Target Date  08/17/18             Plan - 07/06/18 1202    Clinical Impression Statement  Patient is a 73y/o M presenting to OPPT with c/o chronic R hip pain. Pain jumps from lateral, anterior, and posterior R hip, sometimes across R LB as well. Notes intermittent NT down to R lateral calf. Worse with prolonged standing and sitting and sleeping in L sidelying. Notes worsening balance, but denies recent falls. Patient today with good overall lumbar and hip AROM, limited ankle strength, visible atrophy of B glute max and med, increased muscle tone along R lumbar paraspinals, and gait and posture deviations. Unable to reproduce patient's pain this session. Patient educated on gentle stretching and strengthening HEP- patient reported understanding. Would benefit  from skilled PT services 1x/week for 6 weeks to address aforementioned impairments.    Personal Factors and  Comorbidities  Age;Comorbidity 3+;Time since onset of injury/illness/exacerbation;Fitness;Past/Current Experience    Comorbidities  scoliosis, renal insufficiency, PVC, OA, L ventricular hypertrophy, LBP, L anterior fascicular block, RBBB, HLD, HTN, hyperglycemia, B hearing loss, gout, GERD, a-fib, B knee arthritis, cardioversion 2019    Examination-Activity Limitations  Bend;Squat;Bed Mobility;Sleep;Sit;Caring for Others;Stairs;Carry;Stand;Transfers;Lift    Examination-Participation Restrictions  Church;Cleaning;Shop;Community Activity;Driving;Yard Work;Laundry;Meal Prep    Stability/Clinical Decision Making  Evolving/Moderate complexity    Clinical Decision Making  Moderate    Rehab Potential  Good    PT Frequency  1x / week    PT Duration  6 weeks    PT Treatment/Interventions  ADLs/Self Care Home Management;Cryotherapy;Electrical Stimulation;Iontophoresis 4mg /ml Dexamethasone;Moist Heat;Balance training;Therapeutic exercise;Therapeutic activities;Functional mobility training;Stair training;Gait training;Ultrasound;Neuromuscular re-education;Patient/family education;Orthotic Fit/Training;Manual techniques;Vasopneumatic Device;Taping;Energy conservation;Dry needling;Passive range of motion    PT Next Visit Plan  reassess HEP    Consulted and Agree with Plan of Care  Patient       Patient will benefit from skilled therapeutic intervention in order to improve the following deficits and impairments:  Decreased activity tolerance, Decreased strength, Pain, Difficulty walking, Decreased balance, Improper body mechanics, Decreased range of motion, Postural dysfunction, Impaired flexibility  Visit Diagnosis: 1. Pain in right hip   2. Other symptoms and signs involving the musculoskeletal system        Problem List Patient Active Problem List   Diagnosis Date Noted  . Right hip pain 06/22/2018  . Ureteral obstruction 06/07/2018  . Urinary tract infection 04/19/2018  . Hydronephrosis  03/01/2018  . Foot pain, bilateral 12/28/2017  . Kidney stone 09/18/2017  . Insomnia 09/17/2017  . Abnormal TSH 04/15/2017  . Cataract 09/29/2016  . Muscle cramp 09/29/2016  . Nocturia 09/29/2016  . Hyperglycemia 03/29/2016  . Low back pain 03/26/2016  . Cataracts, bilateral 03/26/2016  . Arthritis of both knees 03/26/2016  . Hearing loss 03/26/2016  . Complex renal cyst 06/16/2015  . History of atrial fibrillation 06/16/2015  . Snoring 06/07/2015  . Somnolence 06/07/2015  . Preventative health care 03/07/2015  . Lipoma of axilla 06/04/2014  . Tinea cruris 03/20/2014  . Lichen simplex chronicus 03/20/2014  . Medicare annual wellness visit, subsequent 03/12/2014  . Depression with anxiety 09/25/2013  . Constipation 09/25/2013  . Gouty arthritis of toe of left foot 08/01/2013  . Skin rash 05/27/2013  . Abnormal liver function 03/18/2013  . IBS (irritable bowel syndrome) 03/18/2013  . Other malaise and fatigue 03/13/2013  . Tinea corporis 03/13/2013  . Gout 12/14/2012  . Diarrhea 12/14/2012  . Renal insufficiency 12/14/2012  . Rectal irritation 09/18/2012  . Diverticulosis   . BCC (basal cell carcinoma of skin)   . Scoliosis 01/19/2011  . Atrial fibrillation ([place]) 11/27/2010  . Hyperlipidemia, mixed 03/11/2010  . Essential hypertension, benign 03/11/2010  . ALLERGIC RHINITIS 03/11/2010  . Obesity 11/25/2007  . BENIGN NEOPLASM OF ADRENAL GLAND 09/22/2007  . Fatty liver disease, nonalcoholic 56/31/4970  . Osteoarthritis 09/22/2007  . SPONDYLOSIS, LUMBAR 09/22/2007  . GERD 04/12/2007     Janene Harvey, PT, DPT 07/06/18 12:21 PM   [facility] [address]  [place], [place], [address] Phone: [phone]   Fax:  [phone]  Name: JAMARIAN JACINTO MRN: [id number] Date of Birth: 1945/11/02

## 2018-07-11 ENCOUNTER — Other Ambulatory Visit: Payer: Self-pay

## 2018-07-11 ENCOUNTER — Ambulatory Visit: Payer: Medicare Other

## 2018-07-11 DIAGNOSIS — M25551 Pain in right hip: Secondary | ICD-10-CM | POA: Diagnosis not present

## 2018-07-11 DIAGNOSIS — R29898 Other symptoms and signs involving the musculoskeletal system: Secondary | ICD-10-CM

## 2018-07-11 NOTE — Therapy (Addendum)
[facility] [address]  [place], [place], [address] Phone: [phone]   Fax:  [phone]  Physical Therapy Treatment  Patient Details  Name: David [name] MRN: [id number] Date of Birth: 1945-05-01 Referring Provider (PT): Penni Homans, MD   Encounter Date: 07/11/2018  PT End of Session - 07/11/18 1415    Visit Number  2    Number of Visits  7    Date for PT Re-Evaluation  08/17/18    Authorization Type  BCBS Medicare    PT Start Time  1194    PT Stop Time  1458    PT Time Calculation (min)  60 min    Activity Tolerance  Patient tolerated treatment well    Behavior During Therapy  [facility] for tasks assessed/performed       Past Medical History:  Diagnosis Date  . Abnormal liver function 03/18/2013  . Adenoma of right adrenal gland 07/11/2002   2.4cm , noted on CT ABD  . Anxiety   . Arthritis of both knees 03/26/2016  . Astigmatism   . Atrial fibrillation ([place])    x1 successful cardioversions  . Back pain   . BCC (basal cell carcinoma of skin)    under right eye and right ear  . Cataract 09/29/2016  . Depression   . Diverticulitis   . Diverticulosis   . Fatty liver   . GERD (gastroesophageal reflux disease)   . Gout   . Hearing loss of both ears 03/26/2016  . History of chronic prostatitis    started at age 80  . History of kidney stones   . Hyperglycemia 03/29/2016  . Hyperlipidemia   . Hypertension   . IBS (irritable bowel syndrome) 03/18/2013  . Incomplete right bundle branch block (RBBB)   . Internal hemorrhoids   . Kidney lesion 06/07/2015   Right midportion, 1.1x1.1 cm hyperechoic, noted on [place] ABD  . LAFB (left anterior fascicular block)   . Lipoma of axilla 09/2016  . Liver lesion, right lobe 06/07/2015   2.5x2.4x2.4 cm hypoechoic lesion posterior aspect, noted on [place] ABD  . Low back pain 03/26/2016  . LVH (left ventricular hypertrophy) 08/06/2017   Moderate, noted on ECHO  . Medicare  annual wellness visit, subsequent 03/12/2014  . Muscle cramps   . Nocturia   . OA (osteoarthritis)    Back, Hands, Neck  . Obesity 11/25/2007   Qualifier: Diagnosis of  By: Lenna Gilford MD, Deborra Medina   . Other malaise and fatigue 03/13/2013  . Penis symptom or sign    PENIS RED NO DRAINAGE   . Premature ventricular complex   . Preventative health care 03/07/2015  . Renal insufficiency   . Scoliosis    Upper thoracic and lumbar  . Vitamin D deficiency    resolved    Past Surgical History:  Procedure Laterality Date  . CARDIOVERSION  07/31/2017  . CATARACT EXTRACTION, BILATERAL    . COLON SURGERY  2009   segmental sigmoid resection  . COLONOSCOPY    . CYSTOSCOPY W/ RETROGRADES Right 06/07/2018   Procedure: CYSTOSCOPY WITH RETROGRADE PYELOGRAM, uphrostogram;  Surgeon: Cleon Gustin, MD;  Location: [facility];  Service: Urology;  Laterality: Right;  . CYSTOSCOPY WITH URETEROSCOPY AND STENT PLACEMENT Right 02/28/2018   Procedure: CYSTOSCOPY WITH URETEROSCOPY Concha Se;  Surgeon: Kathie Rhodes, MD;  Location: [facility];  Service: Urology;  Laterality: Right;  . CYSTOSCOPY/URETEROSCOPY/HOLMIUM LASER/STENT PLACEMENT Right 11/29/2017   Procedure: CYSTOSCOPY/RETROGRADE/URETEROSCOPY/HOLMIUM LASER/STENT  PLACEMENT;  Surgeon: Kathie Rhodes, MD;  Location: [facility];  Service: Urology;  Laterality: Right;  . EYE SURGERY Bilateral 01/12/2017   cataract removal  . HOLMIUM LASER APPLICATION Right 1/60/7371   Procedure: HOLMIUM LASER APPLICATION;  Surgeon: Kathie Rhodes, MD;  Location: [facility];  Service: Urology;  Laterality: Right;  . IR BALLOON DILATION URETERAL STRICTURE RIGHT  03/14/2018  . IR NEPHRO TUBE REMOV/FL  03/17/2018  . IR NEPHROSTOGRAM RIGHT THRU EXISTING ACCESS  03/17/2018  . IR NEPHROSTOMY EXCHANGE RIGHT  03/14/2018  . IR NEPHROSTOMY EXCHANGE RIGHT  06/21/2018  . IR NEPHROSTOMY PLACEMENT RIGHT  03/02/2018  . IR NEPHROSTOMY PLACEMENT RIGHT  04/20/2018  . IR URETERAL  STENT PLACEMENT EXISTING ACCESS RIGHT  03/14/2018  . NOSE SURGERY     Submucous resection age 44  . NUCLEAR STRESS TEST  06/03/2009  . ROBOT ASSISTED PYELOPLASTY Right 06/07/2018   Procedure: attempted XI ROBOTIC ASSISTED PYELOPLASTY, lysis of adhesions;  Surgeon: Cleon Gustin, MD;  Location[facility];  Service: Urology;  Laterality: Right;  3 HRS  . SKIN BIOPSY    . URETEROSCOPY WITH HOLMIUM LASER LITHOTRIPSY Right 09/24/2017   Procedure: CYSTOSCOPY, URETEROSCOPY WITH HOLMIUM LASER LITHOTRIPSY, STENT PLACEMENT;  Surgeon: Kathie Rhodes, MD;  Location: [facility];  Service: Urology;  Laterality: Right;    There were no vitals filed for this visit.  Subjective Assessment - 07/11/18 1405    Subjective  Pt. noting some soreness after last session.    Pertinent History  scoliosis, renal insufficiency, PVC, OA, L ventricular hypertrophy, LBP, L anterior fascicular block, RBBB, HLD, HTN, hyperglycemia, B hearing loss, gout, GERD, a-fib, B knee arthritis, cardioversion 2019    Diagnostic tests  07/01/18 hip xray: No acute abnormality. Mild bilateral hip joint space narrowing. LOWER lumbar spine degenerative changes.    Patient Stated Goals  "stretch the muscle and get back to running"    Currently in Pain?  Yes    Pain Score  3     Pain Location  Hip    Pain Orientation  Right    Pain Descriptors / Indicators  Sore    Pain Type  Chronic pain    Pain Radiating Towards  soreness into R lower back    Multiple Pain Sites  No                       [facility] Adult PT Treatment/Exercise - 07/11/18 0001      Lumbar Exercises: Stretches   Piriformis Stretch  Right;Left;1 rep;30 seconds    Piriformis Stretch Limitations  KTOS seated     Figure 4 Stretch  30 seconds;1 rep    Figure 4 Stretch Limitations  B seated       Lumbar Exercises: Aerobic   Nustep  Lvl 3, 6 min       Lumbar Exercises: Supine   Pelvic Tilt  10 reps    Pelvic Tilt Limitations  5" hold     Clam   10 reps;3 seconds   + abdom. bracing    Clam Limitations  Red looped TB at knees    Bridge  10 reps    Bridge Limitations  Cues for neutral pelvic position       Knee/Hip Exercises: Standing   Hip Abduction  Right;Left;10 reps;Knee straight;Stengthening    Abduction Limitations  counter     Functional Squat  10 reps;3 seconds    Functional Squat Limitations  chair and two  airex pad       Moist Heat Therapy   Number Minutes Moist Heat  10 Minutes    Moist Heat Location  Lumbar Spine      Electrical Stimulation   Electrical Stimulation Location  lumbar spine     Electrical Stimulation Action  IFC    Electrical Stimulation Parameters  to tolerance, 10'    Electrical Stimulation Goals  Pain;Tone               PT Short Term Goals - 07/11/18 1416      PT SHORT TERM GOAL #1   Title  Patient to be independent with HEP.    Time  3    Period  Weeks    Status  On-going    Target Date  07/27/18        PT Long Term Goals - 07/11/18 1416      PT LONG TERM GOAL #1   Title  Patient to be independent with advanced HEP.    Time  6    Period  Weeks    Status  On-going      PT LONG TERM GOAL #2   Title  Patient to demonstrate B LE strength, including all planes of hip motion >=4+/5.    Time  6    Period  Weeks    Status  On-going      PT LONG TERM GOAL #3   Title  Patient to report tolerance for 1 hour of standing without increase in pain.    Time  6    Period  Weeks    Status  On-going      PT LONG TERM GOAL #4   Title  Patient to demonstrate stair climbing up/down 13 steps with 1 handrail as needed with good hip stability and no pain.    Time  6    Period  Weeks    Status  On-going      PT LONG TERM GOAL #5   Title  Patient to report 75% improvement in R hip and LBP.    Time  6    Period  Weeks    Status  On-going            Plan - 07/11/18 1417    Clinical Impression Statement  Josph Macho reporting two days of soreness in hip and lower back after  performing HEP.  Reviewed HEP and it was revealed that pt. performing incorrect number of repetitions and hold times with most activities issued with initial HEP.  Also demonstrating excessive depth with counter squat.  Pt. instructed in proper HEP performance today and able to perform all HEP activities pain free with good tolerance.  Tolerated all additional gentle lumbopelvic strengthening activities in session well today.  Will monitor tolerance to today's therex progression and continue toward goals in coming session.    Comorbidities  scoliosis, renal insufficiency, PVC, OA, L ventricular hypertrophy, LBP, L anterior fascicular block, RBBB, HLD, HTN, hyperglycemia, B hearing loss, gout, GERD, a-fib, B knee arthritis, cardioversion 2019    Rehab Potential  Good    PT Treatment/Interventions  ADLs/Self Care Home Management;Cryotherapy;Electrical Stimulation;Iontophoresis 4mg /ml Dexamethasone;Moist Heat;Balance training;Therapeutic exercise;Therapeutic activities;Functional mobility training;Stair training;Gait training;Ultrasound;Neuromuscular re-education;Patient/family education;Orthotic Fit/Training;Manual techniques;Vasopneumatic Device;Taping;Energy conservation;Dry needling;Passive range of motion    PT Next Visit Plan  Monitor tolerance to gentle progression of lumbopelvic strengthening activities; monitor tolerance to HEP as needed; modalities prn    Consulted and Agree with Plan of Care  Patient  Patient will benefit from skilled therapeutic intervention in order to improve the following deficits and impairments:  Decreased activity tolerance, Decreased strength, Pain, Difficulty walking, Decreased balance, Improper body mechanics, Decreased range of motion, Postural dysfunction, Impaired flexibility  Visit Diagnosis: 1. Pain in right hip   2. Other symptoms and signs involving the musculoskeletal system        Problem List Patient Active Problem List   Diagnosis Date Noted  .  Right hip pain 06/22/2018  . Ureteral obstruction 06/07/2018  . Urinary tract infection 04/19/2018  . Hydronephrosis 03/01/2018  . Foot pain, bilateral 12/28/2017  . Kidney stone 09/18/2017  . Insomnia 09/17/2017  . Abnormal TSH 04/15/2017  . Cataract 09/29/2016  . Muscle cramp 09/29/2016  . Nocturia 09/29/2016  . Hyperglycemia 03/29/2016  . Low back pain 03/26/2016  . Cataracts, bilateral 03/26/2016  . Arthritis of both knees 03/26/2016  . Hearing loss 03/26/2016  . Complex renal cyst 06/16/2015  . History of atrial fibrillation 06/16/2015  . Snoring 06/07/2015  . Somnolence 06/07/2015  . Preventative health care 03/07/2015  . Lipoma of axilla 06/04/2014  . Tinea cruris 03/20/2014  . Lichen simplex chronicus 03/20/2014  . Medicare annual wellness visit, subsequent 03/12/2014  . Depression with anxiety 09/25/2013  . Constipation 09/25/2013  . Gouty arthritis of toe of left foot 08/01/2013  . Skin rash 05/27/2013  . Abnormal liver function 03/18/2013  . IBS (irritable bowel syndrome) 03/18/2013  . Other malaise and fatigue 03/13/2013  . Tinea corporis 03/13/2013  . Gout 12/14/2012  . Diarrhea 12/14/2012  . Renal insufficiency 12/14/2012  . Rectal irritation 09/18/2012  . Diverticulosis   . BCC (basal cell carcinoma of skin)   . Scoliosis 01/19/2011  . Atrial fibrillation ([place]) 11/27/2010  . Hyperlipidemia, mixed 03/11/2010  . Essential hypertension, benign 03/11/2010  . ALLERGIC RHINITIS 03/11/2010  . Obesity 11/25/2007  . BENIGN NEOPLASM OF ADRENAL GLAND 09/22/2007  . Fatty liver disease, nonalcoholic 72/53/6644  . Osteoarthritis 09/22/2007  . SPONDYLOSIS, LUMBAR 09/22/2007  . GERD 04/12/2007    Bess Harvest, PTA 07/11/18 3:33 PM    [facility] [address]  [place], [place], [address] Phone: [phone]   Fax:  [phone]  Name: EYDEN DOBIE MRN: [id number] Date of Birth:  July 08, 1945

## 2018-07-13 ENCOUNTER — Ambulatory Visit: Payer: Medicare Other | Attending: Family Medicine | Admitting: Physical Therapy

## 2018-07-13 DIAGNOSIS — M25551 Pain in right hip: Secondary | ICD-10-CM | POA: Insufficient documentation

## 2018-07-13 DIAGNOSIS — R29898 Other symptoms and signs involving the musculoskeletal system: Secondary | ICD-10-CM | POA: Insufficient documentation

## 2018-07-14 ENCOUNTER — Ambulatory Visit: Payer: Medicare Other

## 2018-07-14 ENCOUNTER — Other Ambulatory Visit: Payer: Self-pay

## 2018-07-14 DIAGNOSIS — R29898 Other symptoms and signs involving the musculoskeletal system: Secondary | ICD-10-CM

## 2018-07-14 DIAGNOSIS — M25551 Pain in right hip: Secondary | ICD-10-CM

## 2018-07-14 NOTE — Therapy (Signed)
[facility] [address]  [place], [place], [address] Phone: [phone]   Fax:  [phone]  Physical Therapy Treatment  Patient Details  Name: David [name] MRN: [id number] Date of Birth: 03/06/1945 Referring Provider (PT): Penni Homans, MD   Encounter Date: 07/14/2018  PT End of Session - 07/14/18 1540    Visit Number  3    Number of Visits  7    Date for PT Re-Evaluation  08/17/18    Authorization Type  BCBS Medicare    PT Start Time  1532    PT Stop Time  1627   ended session with 10 min moist heat   PT Time Calculation (min)  55 min    Activity Tolerance  Patient tolerated treatment well    Behavior During Therapy  [facility] for tasks assessed/performed       Past Medical History:  Diagnosis Date  . Abnormal liver function 03/18/2013  . Adenoma of right adrenal gland 07/11/2002   2.4cm , noted on CT ABD  . Anxiety   . Arthritis of both knees 03/26/2016  . Astigmatism   . Atrial fibrillation ([place])    x1 successful cardioversions  . Back pain   . BCC (basal cell carcinoma of skin)    under right eye and right ear  . Cataract 09/29/2016  . Depression   . Diverticulitis   . Diverticulosis   . Fatty liver   . GERD (gastroesophageal reflux disease)   . Gout   . Hearing loss of both ears 03/26/2016  . History of chronic prostatitis    started at age 46  . History of kidney stones   . Hyperglycemia 03/29/2016  . Hyperlipidemia   . Hypertension   . IBS (irritable bowel syndrome) 03/18/2013  . Incomplete right bundle branch block (RBBB)   . Internal hemorrhoids   . Kidney lesion 06/07/2015   Right midportion, 1.1x1.1 cm hyperechoic, noted on [place] ABD  . LAFB (left anterior fascicular block)   . Lipoma of axilla 09/2016  . Liver lesion, right lobe 06/07/2015   2.5x2.4x2.4 cm hypoechoic lesion posterior aspect, noted on [place] ABD  . Low back pain 03/26/2016  . LVH (left ventricular hypertrophy) 08/06/2017    Moderate, noted on ECHO  . Medicare annual wellness visit, subsequent 03/12/2014  . Muscle cramps   . Nocturia   . OA (osteoarthritis)    Back, Hands, Neck  . Obesity 11/25/2007   Qualifier: Diagnosis of  By: Lenna Gilford MD, Deborra Medina   . Other malaise and fatigue 03/13/2013  . Penis symptom or sign    PENIS RED NO DRAINAGE   . Premature ventricular complex   . Preventative health care 03/07/2015  . Renal insufficiency   . Scoliosis    Upper thoracic and lumbar  . Vitamin D deficiency    resolved    Past Surgical History:  Procedure Laterality Date  . CARDIOVERSION  07/31/2017  . CATARACT EXTRACTION, BILATERAL    . COLON SURGERY  2009   segmental sigmoid resection  . COLONOSCOPY    . CYSTOSCOPY W/ RETROGRADES Right 06/07/2018   Procedure: CYSTOSCOPY WITH RETROGRADE PYELOGRAM, uphrostogram;  Surgeon: Cleon Gustin, MD;  Location: [facility];  Service: Urology;  Laterality: Right;  . CYSTOSCOPY WITH URETEROSCOPY AND STENT PLACEMENT Right 02/28/2018   Procedure: CYSTOSCOPY WITH URETEROSCOPY Concha Se;  Surgeon: Kathie Rhodes, MD;  Location[facility];  Service: Urology;  Laterality: Right;  . CYSTOSCOPY/URETEROSCOPY/HOLMIUM LASER/STENT  PLACEMENT Right 11/29/2017   Procedure: CYSTOSCOPY/RETROGRADE/URETEROSCOPY/HOLMIUM LASER/STENT PLACEMENT;  Surgeon: Kathie Rhodes, MD;  Location[facility];  Service: Urology;  Laterality: Right;  . EYE SURGERY Bilateral 01/12/2017   cataract removal  . HOLMIUM LASER APPLICATION Right 07/08/348   Procedure: HOLMIUM LASER APPLICATION;  Surgeon: Kathie Rhodes, MD;  Location: [facility];  Service: Urology;  Laterality: Right;  . IR BALLOON DILATION URETERAL STRICTURE RIGHT  03/14/2018  . IR NEPHRO TUBE REMOV/FL  03/17/2018  . IR NEPHROSTOGRAM RIGHT THRU EXISTING ACCESS  03/17/2018  . IR NEPHROSTOMY EXCHANGE RIGHT  03/14/2018  . IR NEPHROSTOMY EXCHANGE RIGHT  06/21/2018  . IR NEPHROSTOMY PLACEMENT RIGHT  03/02/2018  . IR NEPHROSTOMY  PLACEMENT RIGHT  04/20/2018  . IR URETERAL STENT PLACEMENT EXISTING ACCESS RIGHT  03/14/2018  . NOSE SURGERY     Submucous resection age 73  . NUCLEAR STRESS TEST  06/03/2009  . ROBOT ASSISTED PYELOPLASTY Right 06/07/2018   Procedure: attempted XI ROBOTIC ASSISTED PYELOPLASTY, lysis of adhesions;  Surgeon: Cleon Gustin, MD;  Location[facility];  Service: Urology;  Laterality: Right;  3 HRS  . SKIN BIOPSY    . URETEROSCOPY WITH HOLMIUM LASER LITHOTRIPSY Right 09/24/2017   Procedure: CYSTOSCOPY, URETEROSCOPY WITH HOLMIUM LASER LITHOTRIPSY, STENT PLACEMENT;  Surgeon: Kathie Rhodes, MD;  Location: [facility];  Service: Urology;  Laterality: Right;    There were no vitals filed for this visit.  Subjective Assessment - 07/14/18 1539    Subjective  Pt. denies soreness after last session.    Pertinent History  scoliosis, renal insufficiency, PVC, OA, L ventricular hypertrophy, LBP, L anterior fascicular block, RBBB, HLD, HTN, hyperglycemia, B hearing loss, gout, GERD, a-fib, B knee arthritis, cardioversion 2019    Diagnostic tests  07/01/18 hip xray: No acute abnormality. Mild bilateral hip joint space narrowing. LOWER lumbar spine degenerative changes.    Patient Stated Goals  "stretch the muscle and get back to running"    Currently in Pain?  Yes    Pain Score  2     Pain Location  Knee    Pain Orientation  Right    Pain Descriptors / Indicators  Sore    Pain Type  Chronic pain    Multiple Pain Sites  No                       [facility] Adult PT Treatment/Exercise - 07/14/18 0001      Lumbar Exercises: Stretches   Passive Hamstring Stretch  Right;Left;1 rep;30 seconds    Passive Hamstring Stretch Limitations  strap    Lumbar Stabilization Level 1  2 reps;30 seconds    Lumbar Stabilization Level 1 Limitations  green p-ball rollouts forward and L     Piriformis Stretch  Right;Left;1 rep;30 seconds    Piriformis Stretch Limitations  KTOS seated     Figure 4  Stretch  30 seconds;1 rep    Figure 4 Stretch Limitations  B seated       Lumbar Exercises: Aerobic   Nustep  Lvl 3, 6 min       Lumbar Exercises: Seated   Sit to Stand  10 reps      Lumbar Exercises: Supine   Ab Set  5 seconds;10 reps    AB Set Limitations  tactile cues required    Bent Knee Raise  10 reps;3 seconds    Bent Knee Raise Limitations  tactile cues required for neutral pelvis    Bridge  10 reps    Bridge Limitations  limited ROM     Straight Leg Raise  10 reps;3 seconds      Lumbar Exercises: Sidelying   Clam  Left;Right;10 reps    Clam Limitations  yellow TB at knees L performed in seated with yellow TB at knees       Moist Heat Therapy   Number Minutes Moist Heat  10 Minutes    Moist Heat Location  Lumbar Spine   and lower thoracic spine      Manual Therapy   Manual Therapy  Soft tissue mobilization    Manual therapy comments  seated     Soft tissue mobilization  STM/DTM to R thoracic/lumbar spine in area of increased tone                PT Short Term Goals - 07/11/18 1416      PT SHORT TERM GOAL #1   Title  Patient to be independent with HEP.    Time  3    Period  Weeks    Status  On-going    Target Date  07/27/18        PT Long Term Goals - 07/11/18 1416      PT LONG TERM GOAL #1   Title  Patient to be independent with advanced HEP.    Time  6    Period  Weeks    Status  On-going      PT LONG TERM GOAL #2   Title  Patient to demonstrate B LE strength, including all planes of hip motion >=4+/5.    Time  6    Period  Weeks    Status  On-going      PT LONG TERM GOAL #3   Title  Patient to report tolerance for 1 hour of standing without increase in pain.    Time  6    Period  Weeks    Status  On-going      PT LONG TERM GOAL #4   Title  Patient to demonstrate stair climbing up/down 13 steps with 1 handrail as needed with good hip stability and no pain.    Time  6    Period  Weeks    Status  On-going      PT LONG TERM GOAL #5    Title  Patient to report 75% improvement in R hip and LBP.    Time  6    Period  Weeks    Status  On-going            Plan - 07/14/18 1831    Clinical Impression Statement  David [name] doing well today.  Tolerated progression of lumbopelvic strengthening activities and core stabilization well without increased pain.  MT addressing increased tone/tenderness in R thoracic/lumbar paraspinals.  David [name] reports improved tolerance for initial HEP, noting, "I think I'm just used to it now".  Ended session with moist heat pack to lumbar/thoracic paraspinals for hopeful relaxation of musculature.  Will continue to progress toward goals.    Personal Factors and Comorbidities  Age;Comorbidity 3+;Time since onset of injury/illness/exacerbation;Fitness;Past/Current Experience    Comorbidities  scoliosis, renal insufficiency, PVC, OA, L ventricular hypertrophy, LBP, L anterior fascicular block, RBBB, HLD, HTN, hyperglycemia, B hearing loss, gout, GERD, a-fib, B knee arthritis, cardioversion 2019    Rehab Potential  Good    PT Treatment/Interventions  ADLs/Self Care Home Management;Cryotherapy;Electrical Stimulation;Iontophoresis 4mg /ml Dexamethasone;Moist Heat;Balance training;Therapeutic exercise;Therapeutic activities;Functional mobility training;Stair training;Gait training;Ultrasound;Neuromuscular re-education;Patient/family education;Orthotic Fit/Training;Manual  techniques;Vasopneumatic Device;Taping;Energy conservation;Dry needling;Passive range of motion    PT Next Visit Plan  Progression of lumbopelvic strengthening activities; monitor tolerance to HEP as needed; modalities prn    Consulted and Agree with Plan of Care  Patient       Patient will benefit from skilled therapeutic intervention in order to improve the following deficits and impairments:  Decreased activity tolerance, Decreased strength, Pain, Difficulty walking, Decreased balance, Improper body mechanics, Decreased range of motion, Postural  dysfunction, Impaired flexibility  Visit Diagnosis: 1. Pain in right hip   2. Other symptoms and signs involving the musculoskeletal system        Problem List Patient Active Problem List   Diagnosis Date Noted  . Right hip pain 06/22/2018  . Ureteral obstruction 06/07/2018  . Urinary tract infection 04/19/2018  . Hydronephrosis 03/01/2018  . Foot pain, bilateral 12/28/2017  . Kidney stone 09/18/2017  . Insomnia 09/17/2017  . Abnormal TSH 04/15/2017  . Cataract 09/29/2016  . Muscle cramp 09/29/2016  . Nocturia 09/29/2016  . Hyperglycemia 03/29/2016  . Low back pain 03/26/2016  . Cataracts, bilateral 03/26/2016  . Arthritis of both knees 03/26/2016  . Hearing loss 03/26/2016  . Complex renal cyst 06/16/2015  . History of atrial fibrillation 06/16/2015  . Snoring 06/07/2015  . Somnolence 06/07/2015  . Preventative health care 03/07/2015  . Lipoma of axilla 06/04/2014  . Tinea cruris 03/20/2014  . Lichen simplex chronicus 03/20/2014  . Medicare annual wellness visit, subsequent 03/12/2014  . Depression with anxiety 09/25/2013  . Constipation 09/25/2013  . Gouty arthritis of toe of left foot 08/01/2013  . Skin rash 05/27/2013  . Abnormal liver function 03/18/2013  . IBS (irritable bowel syndrome) 03/18/2013  . Other malaise and fatigue 03/13/2013  . Tinea corporis 03/13/2013  . Gout 12/14/2012  . Diarrhea 12/14/2012  . Renal insufficiency 12/14/2012  . Rectal irritation 09/18/2012  . Diverticulosis   . BCC (basal cell carcinoma of skin)   . Scoliosis 01/19/2011  . Atrial fibrillation ([place]) 11/27/2010  . Hyperlipidemia, mixed 03/11/2010  . Essential hypertension, benign 03/11/2010  . ALLERGIC RHINITIS 03/11/2010  . Obesity 11/25/2007  . BENIGN NEOPLASM OF ADRENAL GLAND 09/22/2007  . Fatty liver disease, nonalcoholic 74/82/7078  . Osteoarthritis 09/22/2007  . SPONDYLOSIS, LUMBAR 09/22/2007  . GERD 04/12/2007    Bess Harvest, PTA 07/14/18 6:33 PM   [facility] [address]  [place], [place], [address] Phone: [phone]   Fax:  [phone]  Name: David [name] MRN: [id number] Date of Birth: [birth year]

## 2018-07-14 NOTE — Telephone Encounter (Signed)
LM for pt to schedule appt if needed but CPE is scheduled for 7/27

## 2018-07-14 NOTE — Telephone Encounter (Signed)
Please schedule appt

## 2018-07-18 ENCOUNTER — Other Ambulatory Visit: Payer: Self-pay

## 2018-07-18 ENCOUNTER — Ambulatory Visit (INDEPENDENT_AMBULATORY_CARE_PROVIDER_SITE_OTHER): Payer: Medicare Other | Admitting: Family Medicine

## 2018-07-18 ENCOUNTER — Ambulatory Visit: Payer: Medicare Other

## 2018-07-18 DIAGNOSIS — M79602 Pain in left arm: Secondary | ICD-10-CM | POA: Diagnosis not present

## 2018-07-18 DIAGNOSIS — M25551 Pain in right hip: Secondary | ICD-10-CM

## 2018-07-18 DIAGNOSIS — R29898 Other symptoms and signs involving the musculoskeletal system: Secondary | ICD-10-CM

## 2018-07-18 DIAGNOSIS — M79603 Pain in arm, unspecified: Secondary | ICD-10-CM | POA: Insufficient documentation

## 2018-07-18 DIAGNOSIS — I10 Essential (primary) hypertension: Secondary | ICD-10-CM | POA: Diagnosis not present

## 2018-07-18 DIAGNOSIS — R739 Hyperglycemia, unspecified: Secondary | ICD-10-CM | POA: Diagnosis not present

## 2018-07-18 DIAGNOSIS — M1A9XX0 Chronic gout, unspecified, without tophus (tophi): Secondary | ICD-10-CM

## 2018-07-18 DIAGNOSIS — N289 Disorder of kidney and ureter, unspecified: Secondary | ICD-10-CM

## 2018-07-18 NOTE — Progress Notes (Signed)
Virtual Visit via Phone Note  I connected with David [name] on 07/18/18 at 10:00 AM EDT by a phone enabled telemedicine application and verified that I am speaking with the correct person using two identifiers.  Location: Patient: home Provider: office   I discussed the limitations of evaluation and management by telemedicine and the availability of in person appointments. The patient expressed understanding and agreed to proceed. David [name] CMA was able to get the patient set up with phone visit after being unable to get video visit set up    Subjective:    Patient ID: [name] [name], male    DOB: May 14, 1945, 73 y.o.   MRN: [id number]  No chief complaint on file.   HPI Patient is in today for follow up on chronic medical concerns including renal disease, hyperglycemia, anxiety and more. No recent febrile illness. He is struggling with lower left arm  Pain worse with movement no fall or trauma. Notes some mild scrotal pain but not changing. Denies CP/palp/SOB/HA/congestion/fevers/GI c/o. Taking meds as prescribed  Past Medical History:  Diagnosis Date  . Abnormal liver function 03/18/2013  . Adenoma of right adrenal gland 07/11/2002   2.4cm , noted on CT ABD  . Anxiety   . Arthritis of both knees 03/26/2016  . Astigmatism   . Atrial fibrillation ([place])    x1 successful cardioversions  . Back pain   . BCC (basal cell carcinoma of skin)    under right eye and right ear  . Cataract 09/29/2016  . Depression   . Diverticulitis   . Diverticulosis   . Fatty liver   . GERD (gastroesophageal reflux disease)   . Gout   . Hearing loss of both ears 03/26/2016  . History of chronic prostatitis    started at age 69  . History of kidney stones   . Hyperglycemia 03/29/2016  . Hyperlipidemia   . Hypertension   . IBS (irritable bowel syndrome) 03/18/2013  . Incomplete right bundle branch block (RBBB)   . Internal hemorrhoids   . Kidney lesion 06/07/2015   Right midportion, 1.1x1.1  cm hyperechoic, noted on [place] ABD  . LAFB (left anterior fascicular block)   . Lipoma of axilla 09/2016  . Liver lesion, right lobe 06/07/2015   2.5x2.4x2.4 cm hypoechoic lesion posterior aspect, noted on [place] ABD  . Low back pain 03/26/2016  . LVH (left ventricular hypertrophy) 08/06/2017   Moderate, noted on ECHO  . Medicare annual wellness visit, subsequent 03/12/2014  . Muscle cramps   . Nocturia   . OA (osteoarthritis)    Back, Hands, Neck  . Obesity 11/25/2007   Qualifier: Diagnosis of  By: David [name], David [name]   . Other malaise and fatigue 03/13/2013  . Penis symptom or sign    PENIS RED NO DRAINAGE   . Premature ventricular complex   . Preventative health care 03/07/2015  . Renal insufficiency   . Scoliosis    Upper thoracic and lumbar  . Vitamin D deficiency    resolved    Past Surgical History:  Procedure Laterality Date  . CARDIOVERSION  07/31/2017  . CATARACT EXTRACTION, BILATERAL    . COLON SURGERY  2009   segmental sigmoid resection  . COLONOSCOPY    . CYSTOSCOPY W/ RETROGRADES Right 06/07/2018   Procedure: CYSTOSCOPY WITH RETROGRADE PYELOGRAM, uphrostogram;  Surgeon: David Gustin, [name];  Location: [facility];  Service: Urology;  Laterality: Right;  . CYSTOSCOPY WITH URETEROSCOPY AND STENT PLACEMENT Right 02/28/2018   Procedure:  CYSTOSCOPY WITH URETEROSCOPY Con[place] Se;  Surgeon: David Rhodes, [name];  Location: [facility];  Service: Urology;  Laterality: Right;  . CYSTOSCOPY/URETEROSCOPY/HOLMIUM LASER/STENT PLACEMENT Right 11/29/2017   Procedure: CYSTOSCOPY/RETROGRADE/URETEROSCOPY/HOLMIUM LASER/STENT PLACEMENT;  Surgeon: David Rhodes, [name];  Location: [facility];  Service: Urology;  Laterality: Right;  . EYE SURGERY Bilateral 01/12/2017   cataract removal  . HOLMIUM LASER APPLICATION Right 6/73/4193   Procedure: HOLMIUM LASER APPLICATION;  Surgeon: David Rhodes, [name];  Location: [facility];  Service: Urology;  Laterality: Right;  . IR BALLOON  DILATION URETERAL STRICTURE RIGHT  03/14/2018  . IR NEPHRO TUBE REMOV/FL  03/17/2018  . IR NEPHROSTOGRAM RIGHT THRU EXISTING ACCESS  03/17/2018  . IR NEPHROSTOMY EXCHANGE RIGHT  03/14/2018  . IR NEPHROSTOMY EXCHANGE RIGHT  06/21/2018  . IR NEPHROSTOMY PLACEMENT RIGHT  03/02/2018  . IR NEPHROSTOMY PLACEMENT RIGHT  04/20/2018  . IR URETERAL STENT PLACEMENT EXISTING ACCESS RIGHT  03/14/2018  . NOSE SURGERY     Submucous resection age 63  . NUCLEAR STRESS TEST  06/03/2009  . ROBOT ASSISTED PYELOPLASTY Right 06/07/2018   Procedure: attempted XI ROBOTIC ASSISTED PYELOPLASTY, lysis of adhesions;  Surgeon: David Gustin, [name];  Location: [facility];  Service: Urology;  Laterality: Right;  3 HRS  . SKIN BIOPSY    . URETEROSCOPY WITH HOLMIUM LASER LITHOTRIPSY Right 09/24/2017   Procedure: CYSTOSCOPY, URETEROSCOPY WITH HOLMIUM LASER LITHOTRIPSY, STENT PLACEMENT;  Surgeon: David Rhodes, [name];  Location: [facility];  Service: Urology;  Laterality: Right;    Family History  Problem Relation Age of Onset  . Heart disease Mother   . Hypertension Mother   . Stroke Mother   . Colon cancer Mother   . Breast cancer Mother   . Heart disease Father        pacemaker  . Aortic aneurysm Father   . Hypertension Father   . Heart disease Sister   . Atrial fibrillation Sister   . Obesity Sister   . Sleep apnea Sister   . Heart attack Brother   . Other Brother        muscle disease  . Arthritis Brother   . Stroke Brother   . Atrial fibrillation Brother   . Cancer Maternal Grandmother        ?  Marland Kitchen Heart attack Maternal Grandmother   . Diabetes Maternal Grandmother   . Cancer Maternal Grandfather        hodgin's lymphoma  . Heart attack Paternal Grandmother   . Anxiety disorder Paternal Grandmother   . Pneumonia Paternal Grandfather   . Heart attack Brother   . Atrial fibrillation Brother   . Atrial fibrillation Brother   . Heart attack Brother   . Hypertension Brother   . Hyperlipidemia Brother    . Heart attack Brother   . Other Brother        heart valve operation  . Atrial fibrillation Brother     Social History   Socioeconomic History  . Marital status: Married    Spouse name: Not on file  . Number of children: 3  . Years of education: Not on file  . Highest education level: Not on file  Occupational History  . Occupation: Nurse, adult: [name]  . Financial resource strain: Not on file  . Food insecurity    Worry: Not on file    Inability: Not on file  . Transportation needs    Medical: Not on file  Non-medical: Not on file  Tobacco Use  . Smoking status: Former Smoker    Packs/day: 1.00    Years: 6.00    Pack years: 6.00    Types: Cigarettes    Start date: 01/12/1969  . Smokeless tobacco: Never Used  . Tobacco comment: [id number]  Substance and Sexual Activity  . Alcohol use: Not Currently    Comment: NONE SINCE July 18 2017  . Drug use: No  . Sexual activity: Yes  Lifestyle  . Physical activity    Days per week: Not on file    Minutes per session: Not on file  . Stress: Not on file  Relationships  . Social Herbalist on phone: Not on file    Gets together: Not on file    Attends religious service: Not on file    Active member of club or organization: Not on file    Attends meetings of clubs or organizations: Not on file    Relationship status: Not on file  . Intimate partner violence    Fear of current or ex partner: Not on file    Emotionally abused: Not on file    Physically abused: Not on file    Forced sexual activity: Not on file  Other Topics Concern  . Not on file  Social History Narrative   The patient is married for the second time, he has 3 sons.   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   2 alcoholic beverages most days.   1 caffeinated beverage daily   No drug use no current tobacco use he is a prior smoker   12/24/2016    Outpatient Medications Prior to Visit  Medication Sig Dispense Refill  .  acetaminophen (TYLENOL) 500 MG tablet Take 1,000 mg by mouth every 6 (six) hours as needed (pain).     Marland Kitchen atenolol (TENORMIN) 25 MG tablet TAKE ONE TABLET BY MOUTH TWICE A DAY (Patient taking differently: Take 25 mg by mouth 2 (two) times daily. ) 180 tablet 2  . bisacodyl (DULCOLAX) 5 MG EC tablet Take 5 mg by mouth daily as needed for moderate constipation.    . carboxymethylcellulose (REFRESH PLUS) 0.5 % SOLN Place 1 drop into both eyes daily.    . clotrimazole-betamethasone (LOTRISONE) cream Apply 1 application topically 2 (two) times daily.  30 g 0  . Colchicine 0.6 MG CAPS 1 tab po bid 60 capsule 0  . cyclobenzaprine (FLEXERIL) 5 MG tablet Take 1 tablet (5 mg total) by mouth 3 (three) times daily as needed for muscle spasms. 30 tablet 2  . fluconazole (DIFLUCAN) 150 MG tablet Take 1 tablet (150 mg total) by mouth daily. 3 tablet 0  . FLUoxetine (PROZAC) 10 MG tablet Take 1 tablet (10 mg total) by mouth 2 (two) times a day. 60 tablet 3  . fluticasone (FLONASE) 50 MCG/ACT nasal spray Place 2 sprays into the nose as needed. For congestion    . HYDROcodone-acetaminophen (NORCO) 5-325 MG tablet Take 1-2 tablets by mouth every 6 (six) hours as needed for moderate pain or severe pain. 20 tablet 0  . ketoconazole (NIZORAL) 2 % cream Apply 1 application topically daily. 60 g 1  . LORazepam (ATIVAN) 1 MG tablet Take 1 tablet (1 mg total) by mouth 2 (two) times daily as needed for anxiety or sedation. (Patient taking differently: Take 0.5 mg by mouth 2 (two) times daily. ) 180 tablet 1  . Menthol, Topical Analgesic, (ICY HOT BACK EX) Apply  1 application topically daily as needed (back pain).    Marland Kitchen omeprazole (PRILOSEC) 20 MG capsule Take 20 mg by mouth daily as needed (acid reflux).      No facility-administered medications prior to visit.     Allergies  Allergen Reactions  . Cefaclor Hives  . Cephalosporins Hives  . Penicillins Rash    Mild maculopapular rash Has patient had a PCN reaction causing  immediate rash, facial/tongue/throat swelling, SOB or lightheadedness with hypotension: No Has patient had a PCN reaction causing severe rash involving mucus membranes or skin necrosis: No Has patient had a PCN reaction that required hospitalization: No Has patient had a PCN reaction occurring within the last 10 years: No If all of the above answers are "NO", then may proceed with Cephalosporin use.     Review of Systems  Constitutional: Positive for malaise/fatigue. Negative for fever.  HENT: Negative for congestion.   Eyes: Negative for blurred vision.  Respiratory: Negative for shortness of breath.   Cardiovascular: Negative for chest pain, palpitations and leg swelling.  Gastrointestinal: Positive for abdominal pain. Negative for blood in stool and nausea.  Genitourinary: Negative for dysuria and frequency.  Musculoskeletal: Positive for joint pain. Negative for falls.  Skin: Negative for rash.  Neurological: Negative for dizziness, loss of consciousness and headaches.  Endo/Heme/Allergies: Negative for environmental allergies.  Psychiatric/Behavioral: Negative for depression. The patient is not nervous/anxious.        Objective:    Physical Exam unable to obtain via phone  There were no vitals taken for this visit. Wt Readings from Last 3 Encounters:  06/22/18 210 lb (95.3 kg)  06/07/18 220 lb 2 oz (99.8 kg)  06/01/18 220 lb 2 oz (99.8 kg)    Diabetic Foot Exam - Simple   No data filed     Lab Results  Component Value Date   WBC 6.7 07/01/2018   HGB 13.9 07/01/2018   HCT 41.2 07/01/2018   PLT 286 07/01/2018   GLUCOSE 105 (H) 07/01/2018   CHOL 242 (H) 07/01/2018   TRIG 132 07/01/2018   HDL 36 (L) 07/01/2018   LDLDIRECT 168.5 03/29/2006   LDLCALC 179 (H) 07/01/2018   ALT 15 07/01/2018   AST 16 07/01/2018   NA 140 07/01/2018   K 4.3 07/01/2018   CL 105 07/01/2018   CREATININE 1.67 (H) 07/01/2018   BUN 19 07/01/2018   CO2 26 07/01/2018   TSH 1.51 07/01/2018    PSA 4.4 (H) 07/01/2018   INR 1.0 04/20/2018   HGBA1C 5.5 04/13/2018    Lab Results  Component Value Date   TSH 1.51 07/01/2018   Lab Results  Component Value Date   WBC 6.7 07/01/2018   HGB 13.9 07/01/2018   HCT 41.2 07/01/2018   MCV 89.0 07/01/2018   PLT 286 07/01/2018   Lab Results  Component Value Date   NA 140 07/01/2018   K 4.3 07/01/2018   CO2 26 07/01/2018   GLUCOSE 105 (H) 07/01/2018   BUN 19 07/01/2018   CREATININE 1.67 (H) 07/01/2018   BILITOT 0.7 07/01/2018   ALKPHOS 81 04/13/2018   AST 16 07/01/2018   ALT 15 07/01/2018   PROT 6.9 07/01/2018   ALBUMIN 4.3 04/13/2018   CALCIUM 10.6 (H) 07/01/2018   ANIONGAP 8 06/08/2018   GFR 43.85 (L) 04/13/2018   Lab Results  Component Value Date   CHOL 242 (H) 07/01/2018   Lab Results  Component Value Date   HDL 36 (L) 07/01/2018   Lab  Results  Component Value Date   LDLCALC 179 (H) 07/01/2018   Lab Results  Component Value Date   TRIG 132 07/01/2018   Lab Results  Component Value Date   CHOLHDL 6.7 (H) 07/01/2018   Lab Results  Component Value Date   HGBA1C 5.5 04/13/2018       Assessment & Plan:   Problem List Items Addressed This Visit    Essential hypertension, benign    no changes to meds. Encouraged heart healthy diet such as the DASH diet and exercise as tolerated.       Gout    Improved with Colchicine consider allopurinol daily if continues to recur      Renal insufficiency    Stable and has a surgery to remove his non functional kidney later this month.       Hyperglycemia    hgba1c acceptable, minimize simple carbs. Increase exercise as tolerated.      Right hip pain    Improved with PT      Arm pain    Pain in forearm that is not resolving will proceed with xray and then if no concerns but no resolution will need a referral to orthor or sports medicine.        Other Visit Diagnoses    Arm pain, left    -  Primary   Relevant Orders   DG Forearm Left      I am  having David [name] "Fred" maintain his fluticasone, atenolol, carboxymethylcellulose, (Menthol, Topical Analgesic, (ICY HOT BACK EX)), omeprazole, acetaminophen, bisacodyl, LORazepam, Lotrisone, cyclobenzaprine, fluconazole, Colchicine, HYDROcodone-acetaminophen, FLUoxetine, and ketoconazole.  No orders of the defined types were placed in this encounter.    Penni Homans, [name]    I discussed the assessment and treatment plan with the patient. The patient was provided an opportunity to ask questions and all were answered. The patient agreed with the plan and demonstrated an understanding of the instructions.   The patient was advised to call back or seek an in-person evaluation if the symptoms worsen or if the condition fails to improve as anticipated.  I provided 25 minutes of non-face-to-face time during this encounter.   Penni Homans, [name]

## 2018-07-18 NOTE — Therapy (Addendum)
[facility] [address]  [place], [place], [address] Phone: [phone]   Fax:  [phone]  Physical Therapy Treatment  Patient Details  Name: David [name] MRN: [id number] Date of Birth: 08/26/1945 Referring Provider (PT): Penni Homans, MD   Encounter Date: 07/18/2018  PT End of Session - 07/18/18 1420    Visit Number  4    Number of Visits  7    Date for PT Re-Evaluation  08/17/18    Authorization Type  [employer] Medicare    PT Start Time  1406    PT Stop Time  1450    PT Time Calculation (min)  44 min    Activity Tolerance  Patient tolerated treatment well    Behavior During Therapy  [facility] for tasks assessed/performed       Past Medical History:  Diagnosis Date  . Abnormal liver function 03/18/2013  . Adenoma of right adrenal gland 07/11/2002   2.4cm , noted on CT ABD  . Anxiety   . Arthritis of both knees 03/26/2016  . Astigmatism   . Atrial fibrillation ([place])    x1 successful cardioversions  . Back pain   . BCC (basal cell carcinoma of skin)    under right eye and right ear  . Cataract 09/29/2016  . Depression   . Diverticulitis   . Diverticulosis   . Fatty liver   . GERD (gastroesophageal reflux disease)   . Gout   . Hearing loss of both ears 03/26/2016  . History of chronic prostatitis    started at age 60  . History of kidney stones   . Hyperglycemia 03/29/2016  . Hyperlipidemia   . Hypertension   . IBS (irritable bowel syndrome) 03/18/2013  . Incomplete right bundle branch block (RBBB)   . Internal hemorrhoids   . Kidney lesion 06/07/2015   Right midportion, 1.1x1.1 cm hyperechoic, noted on [place] ABD  . LAFB (left anterior fascicular block)   . Lipoma of axilla 09/2016  . Liver lesion, right lobe 06/07/2015   2.5x2.4x2.4 cm hypoechoic lesion posterior aspect, noted on [place] ABD  . Low back pain 03/26/2016  . LVH (left ventricular hypertrophy) 08/06/2017   Moderate, noted on ECHO  . Medicare annual  wellness visit, subsequent 03/12/2014  . Muscle cramps   . Nocturia   . OA (osteoarthritis)    Back, Hands, Neck  . Obesity 11/25/2007   Qualifier: Diagnosis of  By: Lenna Gilford MD, Deborra Medina   . Other malaise and fatigue 03/13/2013  . Penis symptom or sign    PENIS RED NO DRAINAGE   . Premature ventricular complex   . Preventative health care 03/07/2015  . Renal insufficiency   . Scoliosis    Upper thoracic and lumbar  . Vitamin D deficiency    resolved    Past Surgical History:  Procedure Laterality Date  . CARDIOVERSION  07/31/2017  . CATARACT EXTRACTION, BILATERAL    . COLON SURGERY  2009   segmental sigmoid resection  . COLONOSCOPY    . CYSTOSCOPY W/ RETROGRADES Right 06/07/2018   Procedure: CYSTOSCOPY WITH RETROGRADE PYELOGRAM, uphrostogram;  Surgeon: Cleon Gustin, MD;  Location: [facility];  Service: Urology;  Laterality: Right;  . CYSTOSCOPY WITH URETEROSCOPY AND STENT PLACEMENT Right 02/28/2018   Procedure: CYSTOSCOPY WITH URETEROSCOPY [place];  Surgeon: Kathie Rhodes, MD;  Location[facility];  Service: Urology;  Laterality: Right;  . CYSTOSCOPY/URETEROSCOPY/HOLMIUM LASER/STENT PLACEMENT Right 11/29/2017   Procedure: CYSTOSCOPY/RETROGRADE/URETEROSCOPY/HOLMIUM LASER/STENT  PLACEMENT;  Surgeon: Kathie Rhodes, MD;  Location: [facility];  Service: Urology;  Laterality: Right;  . EYE SURGERY Bilateral 01/12/2017   cataract removal  . HOLMIUM LASER APPLICATION Right 04/23/8784   Procedure: HOLMIUM LASER APPLICATION;  Surgeon: Kathie Rhodes, MD;  Location[facility];  Service: Urology;  Laterality: Right;  . IR BALLOON DILATION URETERAL STRICTURE RIGHT  03/14/2018  . IR NEPHRO TUBE REMOV/FL  03/17/2018  . IR NEPHROSTOGRAM RIGHT THRU EXISTING ACCESS  03/17/2018  . IR NEPHROSTOMY EXCHANGE RIGHT  03/14/2018  . IR NEPHROSTOMY EXCHANGE RIGHT  06/21/2018  . IR NEPHROSTOMY PLACEMENT RIGHT  03/02/2018  . IR NEPHROSTOMY PLACEMENT RIGHT  04/20/2018  . IR URETERAL STENT  PLACEMENT EXISTING ACCESS RIGHT  03/14/2018  . NOSE SURGERY     Submucous resection age 50  . NUCLEAR STRESS TEST  06/03/2009  . ROBOT ASSISTED PYELOPLASTY Right 06/07/2018   Procedure: attempted XI ROBOTIC ASSISTED PYELOPLASTY, lysis of adhesions;  Surgeon: Cleon Gustin, MD;  Location: [facility];  Service: Urology;  Laterality: Right;  3 HRS  . SKIN BIOPSY    . URETEROSCOPY WITH HOLMIUM LASER LITHOTRIPSY Right 09/24/2017   Procedure: CYSTOSCOPY, URETEROSCOPY WITH HOLMIUM LASER LITHOTRIPSY, STENT PLACEMENT;  Surgeon: Kathie Rhodes, MD;  Location: [facility];  Service: Urology;  Laterality: Right;    There were no vitals filed for this visit.  Subjective Assessment - 07/18/18 1413    Subjective  Pt. doing well today.    Pertinent History  scoliosis, renal insufficiency, PVC, OA, L ventricular hypertrophy, LBP, L anterior fascicular block, RBBB, HLD, HTN, hyperglycemia, B hearing loss, gout, GERD, a-fib, B knee arthritis, cardioversion 2019    Diagnostic tests  07/01/18 hip xray: No acute abnormality. Mild bilateral hip joint space narrowing. LOWER lumbar spine degenerative changes.    Patient Stated Goals  "stretch the muscle and get back to running"    Pain Score  2     Pain Location  Knee    Pain Orientation  Right;Left    Pain Descriptors / Indicators  Sore    Pain Type  Chronic pain    Multiple Pain Sites  No                       [facility] Adult PT Treatment/Exercise - 07/18/18 0001      Lumbar Exercises: Stretches   Single Knee to Chest Stretch  Right;Left;1 rep;30 seconds    Single Knee to Chest Stretch Limitations  B    Piriformis Stretch  Right;Left;1 rep;30 seconds    Piriformis Stretch Limitations  supine       Lumbar Exercises: Aerobic   Nustep  Lvl 4, 7 min       Lumbar Exercises: Standing   Heel Raises  15 reps;3 seconds    Functional Squats  10 reps;3 seconds    Functional Squats Limitations  "mini" squat on TM rail       Lumbar  Exercises: Supine   Clam  15 reps;3 seconds   + abdom. bracing    Clam Limitations  Red looped TB at knees    Bent Knee Raise  15 reps;3 seconds    Bent Knee Raise Limitations  red looped TB at knees; + abdom. bracing     Bridge with clamshell  10 reps;3 seconds    Bridge with [employer] Limitations  Red TB isometric hip abd/ER       Knee/Hip Exercises: Standing   Forward Step  Up  Left;Right;10 reps;Step Height: 6";Hand Hold: 1    Forward Step Up Limitations  Cues for slow pacing              PT Education - 07/18/18 1519    Education Details  HEP update; red looped TB issued to pt. for bridge    Person(s) Educated  Patient    Methods  Explanation;Demonstration;Verbal cues;Handout    Comprehension  Verbalized understanding;Returned demonstration;Verbal cues required       PT Short Term Goals - 07/11/18 1416      PT SHORT TERM GOAL #1   Title  Patient to be independent with HEP.    Time  3    Period  Weeks    Status  On-going    Target Date  07/27/18        PT Long Term Goals - 07/18/18 1427      PT LONG TERM GOAL #1   Title  Patient to be independent with advanced HEP.    Time  6    Period  Weeks    Status  On-going      PT LONG TERM GOAL #2   Title  Patient to demonstrate B LE strength, including all planes of hip motion >=4+/5.    Time  6    Period  Weeks    Status  On-going      PT LONG TERM GOAL #3   Title  Patient to report tolerance for 1 hour of standing without increase in pain.    Time  6    Period  Weeks    Status  On-going      PT LONG TERM GOAL #4   Title  Patient to demonstrate stair climbing up/down 13 steps with 1 handrail as needed with good hip stability and no pain.    Time  6    Period  Weeks    Status  On-going      PT LONG TERM GOAL #5   Title  Patient to report 75% improvement in R hip and LBP.    Time  6    Period  Weeks    Status  Achieved            Plan - 07/18/18 1428    Clinical Impression Statement  Pt.  making progress with therapy reporting ~ 80% overall reduction in R hip and LBP since starting therapy achieving LTG #5.  Continues to note ~ one day of "soreness" after sessions which self-resolves.  Tolerated progression of 6" forward step-up, "min" squat, and addition of heel raise without issue today.  Did have one incidence of dizziness requiring sitting rest break after set of step-ups which pt. feels is due to having to "wear this damn mask" however no loss of balance.  Ended visit with pt. reporting he was pain free thus modalities deferred.    Comorbidities  scoliosis, renal insufficiency, PVC, OA, L ventricular hypertrophy, LBP, L anterior fascicular block, RBBB, HLD, HTN, hyperglycemia, B hearing loss, gout, GERD, a-fib, B knee arthritis, cardioversion 2019    Rehab Potential  Good    PT Treatment/Interventions  ADLs/Self Care Home Management;Cryotherapy;Electrical Stimulation;Iontophoresis 4mg /ml Dexamethasone;Moist Heat;Balance training;Therapeutic exercise;Therapeutic activities;Functional mobility training;Stair training;Gait training;Ultrasound;Neuromuscular re-education;Patient/family education;Orthotic Fit/Training;Manual techniques;Vasopneumatic Device;Taping;Energy conservation;Dry needling;Passive range of motion    PT Next Visit Plan  Progression of lumbopelvic strengthening activities; monitor tolerance to HEP as needed; modalities prn    Consulted and Agree with Plan of Care  Patient  Patient will benefit from skilled therapeutic intervention in order to improve the following deficits and impairments:  Decreased activity tolerance, Decreased strength, Pain, Difficulty walking, Decreased balance, Improper body mechanics, Decreased range of motion, Postural dysfunction, Impaired flexibility  Visit Diagnosis: 1. Pain in right hip   2. Other symptoms and signs involving the musculoskeletal system        Problem List Patient Active Problem List   Diagnosis Date Noted  .  Right hip pain 06/22/2018  . Ureteral obstruction 06/07/2018  . Urinary tract infection 04/19/2018  . Hydronephrosis 03/01/2018  . Foot pain, bilateral 12/28/2017  . Kidney stone 09/18/2017  . Insomnia 09/17/2017  . Abnormal TSH 04/15/2017  . Cataract 09/29/2016  . Muscle cramp 09/29/2016  . Nocturia 09/29/2016  . Hyperglycemia 03/29/2016  . Low back pain 03/26/2016  . Cataracts, bilateral 03/26/2016  . Arthritis of both knees 03/26/2016  . Hearing loss 03/26/2016  . Complex renal cyst 06/16/2015  . History of atrial fibrillation 06/16/2015  . Snoring 06/07/2015  . Somnolence 06/07/2015  . Preventative health care 03/07/2015  . Lipoma of axilla 06/04/2014  . Tinea cruris 03/20/2014  . Lichen simplex chronicus 03/20/2014  . Medicare annual wellness visit, subsequent 03/12/2014  . Depression with anxiety 09/25/2013  . Constipation 09/25/2013  . Gouty arthritis of toe of left foot 08/01/2013  . Skin rash 05/27/2013  . Abnormal liver function 03/18/2013  . IBS (irritable bowel syndrome) 03/18/2013  . Other malaise and fatigue 03/13/2013  . Tinea corporis 03/13/2013  . Gout 12/14/2012  . Diarrhea 12/14/2012  . Renal insufficiency 12/14/2012  . Rectal irritation 09/18/2012  . Diverticulosis   . BCC (basal cell carcinoma of skin)   . Scoliosis 01/19/2011  . Atrial fibrillation ([place]) 11/27/2010  . Hyperlipidemia, mixed 03/11/2010  . Essential hypertension, benign 03/11/2010  . ALLERGIC RHINITIS 03/11/2010  . Obesity 11/25/2007  . BENIGN NEOPLASM OF ADRENAL GLAND 09/22/2007  . Fatty liver disease, nonalcoholic 13/08/6576  . Osteoarthritis 09/22/2007  . SPONDYLOSIS, LUMBAR 09/22/2007  . GERD 04/12/2007    Bess Harvest, PTA 07/18/18 3:20 PM   [facility] [address]  [place], [place], [address] Phone: [phone]   Fax:  [phone]  Name: David [name] MRN: [id number] Date of Birth: 10-11-1945

## 2018-07-18 NOTE — Assessment & Plan Note (Signed)
Stable and has a surgery to remove his non functional kidney later this month.

## 2018-07-18 NOTE — Assessment & Plan Note (Signed)
hgba1c acceptable, minimize simple carbs. Increase exercise as tolerated.  

## 2018-07-18 NOTE — Assessment & Plan Note (Signed)
Pain in forearm that is not resolving will proceed with xray and then if no concerns but no resolution will need a referral to orthor or sports medicine.

## 2018-07-18 NOTE — Assessment & Plan Note (Signed)
Improved with PT.

## 2018-07-18 NOTE — Assessment & Plan Note (Signed)
no changes to meds. Encouraged heart healthy diet such as the DASH diet and exercise as tolerated.  

## 2018-07-18 NOTE — Assessment & Plan Note (Signed)
Improved with [place] consider allopurinol daily if continues to recur

## 2018-07-19 ENCOUNTER — Ambulatory Visit (HOSPITAL_BASED_OUTPATIENT_CLINIC_OR_DEPARTMENT_OTHER)
Admission: RE | Admit: 2018-07-19 | Discharge: 2018-07-19 | Disposition: A | Discharge status: Home / Self Care | Payer: Medicare Other | Source: Ambulatory Visit | Attending: Family Medicine | Admitting: Family Medicine

## 2018-07-19 DIAGNOSIS — M79602 Pain in left arm: Secondary | ICD-10-CM | POA: Insufficient documentation

## 2018-07-20 ENCOUNTER — Encounter: Payer: Self-pay | Admitting: Family Medicine

## 2018-07-21 ENCOUNTER — Encounter: Payer: Medicare Other | Admitting: Physical Therapy

## 2018-07-25 ENCOUNTER — Ambulatory Visit: Payer: Medicare Other | Admitting: Physical Therapy

## 2018-07-25 ENCOUNTER — Other Ambulatory Visit: Payer: Self-pay

## 2018-07-25 ENCOUNTER — Encounter: Payer: Self-pay | Admitting: Physical Therapy

## 2018-07-25 VITALS — BP 125/69 | HR 71

## 2018-07-25 DIAGNOSIS — M25551 Pain in right hip: Secondary | ICD-10-CM

## 2018-07-25 DIAGNOSIS — R29898 Other symptoms and signs involving the musculoskeletal system: Secondary | ICD-10-CM

## 2018-07-25 NOTE — Therapy (Addendum)
[facility] [address]  [place], [place], [address] Phone: [phone]   Fax:  [phone]  Physical Therapy Treatment  Patient Details  Name: David [name] MRN: [id number] Date of Birth: 1945-01-31 Referring Provider (PT): Penni Homans, MD    Progress Note Reporting Period 07/06/18 to 07/25/18  See note below for Objective Data and Assessment of Progress/Goals.    Encounter Date: 07/25/2018  PT End of Session - 07/25/18 1524    Visit Number  5    Number of Visits  7    Date for PT Re-Evaluation  08/17/18    Authorization Type  [employer] Medicare    PT Start Time  9485    PT Stop Time  1527    PT Time Calculation (min)  49 min    Activity Tolerance  Patient tolerated treatment well    Behavior During Therapy  WFL for tasks assessed/performed       Past Medical History:  Diagnosis Date  . Abnormal liver function 03/18/2013  . Adenoma of right adrenal gland 07/11/2002   2.4cm , noted on CT ABD  . Anxiety   . Arthritis of both knees 03/26/2016  . Astigmatism   . Atrial fibrillation ([place])    x1 successful cardioversions  . Back pain   . BCC (basal cell carcinoma of skin)    under right eye and right ear  . Cataract 09/29/2016  . Depression   . Diverticulitis   . Diverticulosis   . Fatty liver   . GERD (gastroesophageal reflux disease)   . Gout   . Hearing loss of both ears 03/26/2016  . History of chronic prostatitis    started at age 34  . History of kidney stones   . Hyperglycemia 03/29/2016  . Hyperlipidemia   . Hypertension   . IBS (irritable bowel syndrome) 03/18/2013  . Incomplete right bundle branch block (RBBB)   . Internal hemorrhoids   . Kidney lesion 06/07/2015   Right midportion, 1.1x1.1 cm hyperechoic, noted on [place] ABD  . LAFB (left anterior fascicular block)   . Lipoma of axilla 09/2016  . Liver lesion, right lobe 06/07/2015   2.5x2.4x2.4 cm hypoechoic lesion posterior aspect, noted  on [place] ABD  . Low back pain 03/26/2016  . LVH (left ventricular hypertrophy) 08/06/2017   Moderate, noted on ECHO  . Medicare annual wellness visit, subsequent 03/12/2014  . Muscle cramps   . Nocturia   . OA (osteoarthritis)    Back, Hands, Neck  . Obesity 11/25/2007   Qualifier: Diagnosis of  By: Lenna Gilford MD, Deborra Medina   . Other malaise and fatigue 03/13/2013  . Penis symptom or sign    PENIS RED NO DRAINAGE   . Premature ventricular complex   . Preventative health care 03/07/2015  . Renal insufficiency   . Scoliosis    Upper thoracic and lumbar  . Vitamin D deficiency    resolved    Past Surgical History:  Procedure Laterality Date  . CARDIOVERSION  07/31/2017  . CATARACT EXTRACTION, BILATERAL    . COLON SURGERY  2009   segmental sigmoid resection  . COLONOSCOPY    . CYSTOSCOPY W/ RETROGRADES Right 06/07/2018   Procedure: CYSTOSCOPY WITH RETROGRADE PYELOGRAM, uphrostogram;  Surgeon: Cleon Gustin, MD;  Location: [facility];  Service: Urology;  Laterality: Right;  . CYSTOSCOPY WITH URETEROSCOPY AND STENT PLACEMENT Right 02/28/2018   Procedure: CYSTOSCOPY WITH URETEROSCOPY [place];  Surgeon: Kathie Rhodes, MD;  Location:  [facility];  Service: Urology;  Laterality: Right;  . CYSTOSCOPY/URETEROSCOPY/HOLMIUM LASER/STENT PLACEMENT Right 11/29/2017   Procedure: CYSTOSCOPY/RETROGRADE/URETEROSCOPY/HOLMIUM LASER/STENT PLACEMENT;  Surgeon: Kathie Rhodes, MD;  Location: [facility];  Service: Urology;  Laterality: Right;  . EYE SURGERY Bilateral 01/12/2017   cataract removal  . HOLMIUM LASER APPLICATION Right 2/50/0370   Procedure: HOLMIUM LASER APPLICATION;  Surgeon: Kathie Rhodes, MD;  Location: [facility];  Service: Urology;  Laterality: Right;  . IR BALLOON DILATION URETERAL STRICTURE RIGHT  03/14/2018  . IR NEPHRO TUBE REMOV/FL  03/17/2018  . IR NEPHROSTOGRAM RIGHT THRU EXISTING ACCESS  03/17/2018  . IR NEPHROSTOMY EXCHANGE RIGHT  03/14/2018  . IR NEPHROSTOMY  EXCHANGE RIGHT  06/21/2018  . IR NEPHROSTOMY PLACEMENT RIGHT  03/02/2018  . IR NEPHROSTOMY PLACEMENT RIGHT  04/20/2018  . IR URETERAL STENT PLACEMENT EXISTING ACCESS RIGHT  03/14/2018  . NOSE SURGERY     Submucous resection age 89  . NUCLEAR STRESS TEST  06/03/2009  . ROBOT ASSISTED PYELOPLASTY Right 06/07/2018   Procedure: attempted XI ROBOTIC ASSISTED PYELOPLASTY, lysis of adhesions;  Surgeon: Cleon Gustin, MD;  Location: [facility];  Service: Urology;  Laterality: Right;  3 HRS  . SKIN BIOPSY    . URETEROSCOPY WITH HOLMIUM LASER LITHOTRIPSY Right 09/24/2017   Procedure: CYSTOSCOPY, URETEROSCOPY WITH HOLMIUM LASER LITHOTRIPSY, STENT PLACEMENT;  Surgeon: Kathie Rhodes, MD;  Location: [facility];  Service: Urology;  Laterality: Right;    Vitals:   07/25/18 1440  BP: 125/69  Pulse: 71  SpO2: 96%    Subjective Assessment - 07/25/18 1438    Subjective  Reports that he had been feeling more stiff in the R hip recently. Feels he has been pushing himself too hard during sessions. Has been walking for exercise. Feels that he has been getting tired easily but denies SOB.    Pertinent History  scoliosis, renal insufficiency, PVC, OA, L ventricular hypertrophy, LBP, L anterior fascicular block, RBBB, HLD, HTN, hyperglycemia, B hearing loss, gout, GERD, a-fib, B knee arthritis, cardioversion 2019    Diagnostic tests  07/01/18 hip xray: No acute abnormality. Mild bilateral hip joint space narrowing. LOWER lumbar spine degenerative changes.    Patient Stated Goals  "stretch the muscle and get back to running"    Currently in Pain?  Yes    Pain Score  0-No pain    Multiple Pain Sites  Yes    Pain Score  3    Pain Location  Knee    Pain Orientation  Right;Left    Pain Descriptors / Indicators  Aching    Pain Type  Chronic pain                       [facility] Adult PT Treatment/Exercise - 07/25/18 0001      Lumbar Exercises: Aerobic   Nustep  Lvl 4, 6 min       Lumbar  Exercises: Supine   Pelvic Tilt  10 reps    Pelvic Tilt Limitations  good ROM    Clam  15 reps;3 seconds    Clam Limitations  Red looped TB at knees + feet on dynadisc   cues for core contraction   Dead Bug  10 reps    Dead Bug Limitations  orange pball on belly    Other Supine Lumbar Exercises  gentle LTR x10   cues to avoid excessive ROM d/t nephrostomy    Other Supine Lumbar Exercises  overhead yellow  medball lift x10   cues for pos pelvic itlt     Knee/Hip Exercises: Standing   Heel Raises  Both;1 set;15 reps    Heel Raises Limitations  heel/toe raise at treadmill rail    Hip ADduction  Strengthening;Right;Left;1 set;10 reps    Hip ADduction Limitations  at treadmill rail   cues to avoid leaning   Hip Extension  Stengthening;Right;Left;1 set;10 reps;Knee straight    Extension Limitations  at treadmill rail   cues for TKE   Functional Squat  10 reps;3 seconds;2 sets    Functional Squat Limitations  red TB above knees   cues to avoid hyperextending knees   Other Standing Knee Exercises  sidestepping with red TB around toes 6x length of treadmill    cues to hold onto treadmill rail d/t imbalance            PT Education - 07/25/18 1523    Education Details  update to HEP    Person(s) Educated  Patient    Methods  Explanation;Demonstration;Tactile cues;Verbal cues;Handout    Comprehension  Verbalized understanding;Returned demonstration       PT Short Term Goals - 07/11/18 1416      PT SHORT TERM GOAL #1   Title  Patient to be independent with HEP.    Time  3    Period  Weeks    Status  On-going    Target Date  07/27/18        PT Long Term Goals - 07/18/18 1427      PT LONG TERM GOAL #1   Title  Patient to be independent with advanced HEP.    Time  6    Period  Weeks    Status  On-going      PT LONG TERM GOAL #2   Title  Patient to demonstrate B LE strength, including all planes of hip motion >=4+/5.    Time  6    Period  Weeks    Status  On-going       PT LONG TERM GOAL #3   Title  Patient to report tolerance for 1 hour of standing without increase in pain.    Time  6    Period  Weeks    Status  On-going      PT LONG TERM GOAL #4   Title  Patient to demonstrate stair climbing up/down 13 steps with 1 handrail as needed with good hip stability and no pain.    Time  6    Period  Weeks    Status  On-going      PT LONG TERM GOAL #5   Title  Patient to report 75% improvement in R hip and LBP.    Time  6    Period  Weeks    Status  Achieved            Plan - 07/25/18 1528    Clinical Impression Statement  Patient reporting flare up in R hip pain after last session which has resolved. Worked on progressive hip strengthening in standing in supine with good tolerance. Patient requiring correction of posture and cues for TKE with standing hip abduction and extension. Patient with inconsistent form with squats- intermittently requiring cues to avoid anterior translation over tibias. Worked on core strengthening on mat without issues. Reported good benefit from LTR, thus added this exercise to HEP. Declined modalities at end of session. Patient reporting good benefit from PT thus far, mentioning much improved sleeping tolerance  since initial eval.    Comorbidities  scoliosis, renal insufficiency, PVC, OA, L ventricular hypertrophy, LBP, L anterior fascicular block, RBBB, HLD, HTN, hyperglycemia, B hearing loss, gout, GERD, a-fib, B knee arthritis, cardioversion 2019    PT Treatment/Interventions  ADLs/Self Care Home Management;Cryotherapy;Electrical Stimulation;Iontophoresis '4mg'$ /ml Dexamethasone;Moist Heat;Balance training;Therapeutic exercise;Therapeutic activities;Functional mobility training;Stair training;Gait training;Ultrasound;Neuromuscular re-education;Patient/family education;Orthotic Fit/Training;Manual techniques;Vasopneumatic Device;Taping;Energy conservation;Dry needling;Passive range of motion    PT Next Visit Plan  Progression  of lumbopelvic strengthening activities; monitor tolerance to HEP as needed; modalities prn    Consulted and Agree with Plan of Care  Patient       Patient will benefit from skilled therapeutic intervention in order to improve the following deficits and impairments:  Decreased activity tolerance, Decreased strength, Pain, Difficulty walking, Decreased balance, Improper body mechanics, Decreased range of motion, Postural dysfunction, Impaired flexibility  Visit Diagnosis: 1. Pain in right hip   2. Other symptoms and signs involving the musculoskeletal system        Problem List Patient Active Problem List   Diagnosis Date Noted  . Arm pain 07/18/2018  . Right hip pain 06/22/2018  . Ureteral obstruction 06/07/2018  . Urinary tract infection 04/19/2018  . Hydronephrosis 03/01/2018  . Foot pain, bilateral 12/28/2017  . Kidney stone 09/18/2017  . Insomnia 09/17/2017  . Abnormal TSH 04/15/2017  . Cataract 09/29/2016  . Muscle cramp 09/29/2016  . Nocturia 09/29/2016  . Hyperglycemia 03/29/2016  . Low back pain 03/26/2016  . Cataracts, bilateral 03/26/2016  . Arthritis of both knees 03/26/2016  . Hearing loss 03/26/2016  . Complex renal cyst 06/16/2015  . History of atrial fibrillation 06/16/2015  . Snoring 06/07/2015  . Somnolence 06/07/2015  . Preventative health care 03/07/2015  . Lipoma of axilla 06/04/2014  . Tinea cruris 03/20/2014  . Lichen simplex chronicus 03/20/2014  . Medicare annual wellness visit, subsequent 03/12/2014  . Depression with anxiety 09/25/2013  . Constipation 09/25/2013  . Gouty arthritis of toe of left foot 08/01/2013  . Skin rash 05/27/2013  . Abnormal liver function 03/18/2013  . IBS (irritable bowel syndrome) 03/18/2013  . Other malaise and fatigue 03/13/2013  . Tinea corporis 03/13/2013  . Gout 12/14/2012  . Diarrhea 12/14/2012  . Renal insufficiency 12/14/2012  . Rectal irritation 09/18/2012  . Diverticulosis   . BCC (basal cell  carcinoma of skin)   . Scoliosis 01/19/2011  . Atrial fibrillation ([place]) 11/27/2010  . Hyperlipidemia, mixed 03/11/2010  . Essential hypertension, benign 03/11/2010  . ALLERGIC RHINITIS 03/11/2010  . Obesity 11/25/2007  . BENIGN NEOPLASM OF ADRENAL GLAND 09/22/2007  . Fatty liver disease, nonalcoholic 16/10/9602  . Osteoarthritis 09/22/2007  . SPONDYLOSIS, LUMBAR 09/22/2007  . GERD 04/12/2007     Janene Harvey, PT, DPT 07/25/18 3:39 PM   [facility] [address]  [place], [place], [address] Phone: [phone]   Fax:  [phone]  Name: David [name] MRN: [id number] Date of Birth: 06/21/1945  PHYSICAL THERAPY DISCHARGE SUMMARY  Visits from Start of Care: 5  Current functional level related to goals / functional outcomes: Unable to assess; patient did not return after recent surgery    Remaining deficits: Unable to assess   Education / Equipment: HEP  Plan: Patient agrees to discharge.  Patient goals were not met. Patient is being discharged due to not returning since the last visit.  ?????     Janene Harvey, PT, DPT 08/30/18 4:10 PM

## 2018-07-28 ENCOUNTER — Encounter: Payer: Medicare Other | Admitting: Physical Therapy

## 2018-08-01 ENCOUNTER — Ambulatory Visit: Payer: Medicare Other | Admitting: Physical Therapy

## 2018-08-01 ENCOUNTER — Other Ambulatory Visit (HOSPITAL_COMMUNITY)
Admission: RE | Admit: 2018-08-01 | Discharge: 2018-08-01 | Disposition: A | Discharge status: Home / Self Care | Payer: Medicare Other | Source: Ambulatory Visit | Attending: Urology | Admitting: Urology

## 2018-08-01 DIAGNOSIS — Z1159 Encounter for screening for other viral diseases: Secondary | ICD-10-CM | POA: Diagnosis present

## 2018-08-02 ENCOUNTER — Encounter (HOSPITAL_COMMUNITY): Payer: Self-pay

## 2018-08-02 LAB — SARS CORONAVIRUS 2 (TAT 6-24 HRS): SARS Coronavirus 2: NEGATIVE

## 2018-08-02 NOTE — Progress Notes (Addendum)
The following are in epic: Last office visit note Dr. Charlett Blake 07/18/2018 EKG 08/06/2017 ECHO 08/06/2017  Left voicemail with Selita to fax over Cardiac clearance from Dr. Percival Spanish.

## 2018-08-02 NOTE — Patient Instructions (Addendum)
DUE TO COVID-19 ONLY ONE VISITOR IS ALLOWED IN THE HOSPITAL AT THIS TIME   COVID SWAB TESTING COMPLETED ON:    August 02, 2018   (Must self quarantine after testing. Follow instructions on handout.)   Your procedure is scheduled on: Tomorrow, August 04, 2018   Surgery Time:  7:30AM-10:30AM   Report to [facility] Main  Entrance   Report to Short Stay at 5:30 AM   Call this number if you have problems the morning of surgery [phone]   Magnesium Citrate:  Drink by noon the day before surgery.   Day before surgery clear liquid diet   CLEAR LIQUID DIET  Foods Allowed                                                                     Foods Excluded  Water, Black Coffee and tea, regular and decaf                             liquids that you cannot  Plain Jell-O in any flavor                                             see through such as: Fruit ices (not with fruit pulp)                                     milk, soups, orange juice  Iced Popsicles                                    All solid food Carbonated beverages, regular and diet                                    Cranberry, grape and apple juices Sports drinks like Gatorade Lightly seasoned clear broth or consume(fat free) Sugar, honey syrup  Sample Menu Breakfast                                Lunch                                     Supper Cranberry juice                    Beef broth                            Chicken broth Jell-O                                     Grape juice  Apple juice Coffee or tea                        Jell-O                                      Popsicle                                                Coffee or tea                        Coffee or tea   Do not eat food or drink liquids :After Midnight.   Brush your teeth the morning of surgery.   Do NOT smoke after Midnight   Take these medicines the morning of surgery with A SIP OF WATER: Atenolol,  Fluoxetine, Lorazepam, Omeprazole   May use eye drops and nasal spray per normal routine day of surgery                               You may not have any metal on your body including  jewelry, and body piercings             Do not wear lotions, powders, perfumes/cologne, or deodorant                          Men may shave face and neck.   Do not bring valuables to the hospital. [place].   Contacts, dentures or bridgework may not be worn into surgery.   Bring small overnight bag day of surgery.   Special Instructions: Bring a copy of your healthcare power of attorney and living will documents         the day of surgery if you haven't scanned them in before.              Please read over the following fact sheets you were given:  [facility] - Preparing for Surgery Before surgery, you can play an important role.  Because skin is not sterile, your skin needs to be as free of germs as possible.  You can reduce the number of germs on your skin by washing with CHG (chlorahexidine gluconate) soap before surgery.  CHG is an antiseptic cleaner which kills germs and bonds with the skin to continue killing germs even after washing. Please DO NOT use if you have an allergy to CHG or antibacterial soaps.  If your skin becomes reddened/irritated stop using the CHG and inform your nurse when you arrive at Short Stay. Do not shave (including legs and underarms) for at least 48 hours prior to the first CHG shower.  You may shave your face/neck.  Please follow these instructions carefully:  1.  Shower with CHG Soap the night before surgery and the  morning of surgery.  2.  If you choose to wash your hair, wash your hair first as usual with your normal  shampoo.  3.  After you shampoo, rinse your hair and body thoroughly to remove the  shampoo.                             4.  Use CHG as you would any other liquid soap.  You can apply chg directly to the  skin and wash.  Gently with a scrungie or clean washcloth.  5.  Apply the CHG Soap to your body ONLY FROM THE NECK DOWN.   Do   not use on face/ open                           Wound or open sores. Avoid contact with eyes, ears mouth and   genitals (private parts).                       Wash face,  Genitals (private parts) with your normal soap.             6.  Wash thoroughly, paying special attention to the area where your    surgery  will be performed.  7.  Thoroughly rinse your body with warm water from the neck down.  8.  DO NOT shower/wash with your normal soap after using and rinsing off the CHG Soap.                9.  Pat yourself dry with a clean towel.            10.  Wear clean pajamas.            11.  Place clean sheets on your bed the night of your first shower and do not  sleep with pets. Day of Surgery : Do not apply any lotions/deodorants the morning of surgery.  Please wear clean clothes to the hospital/surgery center.  FAILURE TO FOLLOW THESE INSTRUCTIONS MAY RESULT IN THE CANCELLATION OF YOUR SURGERY  PATIENT SIGNATURE_________________________________  NURSE SIGNATURE__________________________________  ________________________________________________________________________

## 2018-08-03 ENCOUNTER — Other Ambulatory Visit: Payer: Self-pay

## 2018-08-03 ENCOUNTER — Encounter (HOSPITAL_COMMUNITY)
Admission: RE | Admit: 2018-08-03 | Discharge: 2018-08-03 | Disposition: A | Discharge status: Home / Self Care | Payer: Medicare Other | Source: Ambulatory Visit | Attending: Urology | Admitting: Urology

## 2018-08-03 ENCOUNTER — Encounter (HOSPITAL_COMMUNITY): Payer: Self-pay

## 2018-08-03 DIAGNOSIS — N135 Crossing vessel and stricture of ureter without hydronephrosis: Secondary | ICD-10-CM | POA: Insufficient documentation

## 2018-08-03 DIAGNOSIS — Z01812 Encounter for preprocedural laboratory examination: Secondary | ICD-10-CM | POA: Insufficient documentation

## 2018-08-03 DIAGNOSIS — M419 Scoliosis, unspecified: Secondary | ICD-10-CM | POA: Insufficient documentation

## 2018-08-03 DIAGNOSIS — K219 Gastro-esophageal reflux disease without esophagitis: Secondary | ICD-10-CM | POA: Insufficient documentation

## 2018-08-03 DIAGNOSIS — E785 Hyperlipidemia, unspecified: Secondary | ICD-10-CM | POA: Insufficient documentation

## 2018-08-03 DIAGNOSIS — Z87442 Personal history of urinary calculi: Secondary | ICD-10-CM | POA: Insufficient documentation

## 2018-08-03 DIAGNOSIS — Z85828 Personal history of other malignant neoplasm of skin: Secondary | ICD-10-CM | POA: Insufficient documentation

## 2018-08-03 DIAGNOSIS — I10 Essential (primary) hypertension: Secondary | ICD-10-CM | POA: Insufficient documentation

## 2018-08-03 DIAGNOSIS — I4891 Unspecified atrial fibrillation: Secondary | ICD-10-CM | POA: Insufficient documentation

## 2018-08-03 DIAGNOSIS — I4510 Unspecified right bundle-branch block: Secondary | ICD-10-CM | POA: Insufficient documentation

## 2018-08-03 DIAGNOSIS — Z79899 Other long term (current) drug therapy: Secondary | ICD-10-CM | POA: Insufficient documentation

## 2018-08-03 DIAGNOSIS — N289 Disorder of kidney and ureter, unspecified: Secondary | ICD-10-CM | POA: Insufficient documentation

## 2018-08-03 DIAGNOSIS — F419 Anxiety disorder, unspecified: Secondary | ICD-10-CM | POA: Insufficient documentation

## 2018-08-03 LAB — CBC
HCT: 42.7 % (ref 39.0–52.0)
Hemoglobin: 13.9 g/dL (ref 13.0–17.0)
MCH: 30.2 pg (ref 26.0–34.0)
MCHC: 32.6 g/dL (ref 30.0–36.0)
MCV: 92.6 fL (ref 80.0–100.0)
Platelets: 212 10*3/uL (ref 150–400)
RBC: 4.61 MIL/uL (ref 4.22–5.81)
RDW: 13.3 % (ref 11.5–15.5)
WBC: 5.9 10*3/uL (ref 4.0–10.5)
nRBC: 0 % (ref 0.0–0.2)

## 2018-08-03 LAB — BASIC METABOLIC PANEL
Anion gap: 9 (ref 5–15)
BUN: 22 mg/dL (ref 8–23)
CO2: 24 mmol/L (ref 22–32)
Calcium: 10 mg/dL (ref 8.9–10.3)
Chloride: 104 mmol/L (ref 98–111)
Creatinine, Ser: 1.76 mg/dL — ABNORMAL HIGH (ref 0.61–1.24)
GFR calc Af Amer: 43 mL/min — ABNORMAL LOW (ref >60)
GFR calc non Af Amer: 38 mL/min — ABNORMAL LOW (ref >60)
Glucose, Bld: 91 mg/dL (ref 70–99)
Potassium: 4.3 mmol/L (ref 3.5–5.1)
Sodium: 137 mmol/L (ref 135–145)

## 2018-08-03 MED ORDER — MAGNESIUM CITRATE PO SOLN
1.0000 | Freq: Once | ORAL | Status: DC
  Filled 2018-08-03: qty 296

## 2018-08-03 NOTE — Anesthesia Preprocedure Evaluation (Addendum)
Anesthesia Evaluation  Patient identified by MRN, date of birth, ID band Patient awake    Reviewed: Allergy & Precautions, NPO status , Patient's Chart, lab work & pertinent test results, reviewed documented beta blocker date and time   Airway Mallampati: II  TM Distance: >3 FB Neck ROM: Full    Dental no notable dental hx.    Pulmonary neg pulmonary ROS, former smoker,    Pulmonary exam normal breath sounds clear to auscultation       Cardiovascular hypertension, Pt. on medications and Pt. on home beta blockers Normal cardiovascular exam+ dysrhythmias Atrial Fibrillation  Rhythm:Regular Rate:Normal     Neuro/Psych Anxiety Depression negative neurological ROS  negative psych ROS   GI/Hepatic Neg liver ROS, GERD  ,  Endo/Other  negative endocrine ROS  Renal/GU Renal InsufficiencyRenal disease  negative genitourinary   Musculoskeletal  (+) Arthritis , Osteoarthritis,    Abdominal   Peds negative pediatric ROS (+)  Hematology negative hematology ROS (+)   Anesthesia Other Findings   Reproductive/Obstetrics negative OB ROS                            Anesthesia Physical Anesthesia Plan  ASA: III  Anesthesia Plan: General   Post-op Pain Management:    Induction: Intravenous  PONV Risk Score and Plan: 2 and Ondansetron, Midazolam and Treatment may vary due to age or medical condition  Airway Management Planned: Oral ETT  Additional Equipment:   Intra-op Plan:   Post-operative Plan: Extubation in OR  Informed Consent: I have reviewed the patients History and Physical, chart, labs and discussed the procedure including the risks, benefits and alternatives for the proposed anesthesia with the patient or authorized representative who has indicated his/her understanding and acceptance.     Dental advisory given  Plan Discussed with: CRNA  Anesthesia Plan Comments: (See PAT note  08/03/2018, Konrad Felix, PA-C)       Anesthesia Quick Evaluation

## 2018-08-03 NOTE — Progress Notes (Addendum)
Anesthesia Chart Review   Case: [id number] Date/Time: 08/04/18 0700   Procedure: XI ROBOTIC ASSISTED LAPAROSCOPIC NEPHRECTOMY (Right ) - 3 HRS   Anesthesia type: General   Pre-op diagnosis: RIGHT URETERAL STRICTURE, POORLY FUNCTIONING RIGHT KIDNEY   Location: [facility] 03 / [facility]   Surgeon: Cleon Gustin, MD      DISCUSSION:73 yo former smoker (6 pack years) with h/o anxiety, depression, HLD, PVCs, renal insufficiency, RBBB, GERD, A-fib (s/p cardioversion, Xarelto held due to ongoing hematuria), HTN, right ureteral stricture, poorly functioning right kidney scheduled for above procedure 08/04/2018 with Dr. Nicolette Bang.   Pt last seen by cardiologist, Dr. Minus Breeding, 10/12/17.  Pt stable at this visit.  Will restart Xarelto when cleared by urology due to ongoing hematuria.    S/p urology procedure under general anesthesia 06/07/2018 with no anesthesia complications noted.   Pt can proceed with planned procedure barring acute status change.   VS: BP 140/76 (BP Location: Left Arm)   Pulse (!) 51   Temp 36.8 C (Oral)   Resp 18   Ht 6' (1.829 m)   Wt 97.5 kg   SpO2 98%   BMI 29.16 kg/m   PROVIDERS: Mosie Lukes, MD last seen 07/18/2018  Minus Breeding, MD is cardiologist  LABS: Labs reviewed: Acceptable for surgery. (all labs ordered are listed, but only abnormal results are displayed)  Labs Reviewed  BASIC METABOLIC PANEL - Abnormal; Notable for the following components:      Result Value   Creatinine, Ser 1.76 (*)    GFR calc non Af Amer 38 (*)    GFR calc Af Amer 43 (*)    All other components within normal limits  CBC     IMAGES:   EKG: 08/06/2017 Rate 59 bpm Sinus bradycardia Incomplete right bundle branch block Left anterior fascicular block Abnormal ECG No significant change since last tracing  CV: Echo 08/06/17 Study Conclusions  - Left ventricle: The cavity size was normal. Wall thickness was increased in a pattern of moderate LVH.  Systolic function was normal. The estimated ejection fraction was in the range of 60% to 65%. Left ventricular diastolic function parameters were normal. - Aortic valve: There was mild regurgitation. - Right ventricle: The cavity size was mildly dilated. - Atrial septum: No defect or patent foramen ovale was identified.  Stress Test 08/11/2005 Impression: Insignificant upsloping ST segment depression.  Normal stress nuclear study.  Past Medical History:  Diagnosis Date  . Abnormal liver function 03/18/2013  . Adenoma of right adrenal gland 07/11/2002   2.4cm , noted on CT ABD  . Anxiety   . Arthritis of both knees 03/26/2016  . Astigmatism   . Atrial fibrillation ([place])    x1 successful cardioversions  . Back pain   . BCC (basal cell carcinoma of skin)    under right eye and right ear  . Cataract 09/29/2016  . Depression   . Diverticulitis   . Diverticulosis   . Fatty liver   . GERD (gastroesophageal reflux disease)   . Gout   . Hearing loss of both ears 03/26/2016  . History of chronic prostatitis    started at age 69  . History of kidney stones   . Hyperglycemia 03/29/2016  . Hyperlipidemia   . Hypertension   . IBS (irritable bowel syndrome) 03/18/2013  . Incomplete right bundle branch block (RBBB)   . Internal hemorrhoids   . Kidney lesion 06/07/2015   Right midportion, 1.1x1.1 cm hyperechoic, noted on  [place] ABD  . LAFB (left anterior fascicular block)   . Left anterior fascicular block 08/06/2017   Noted on EKG  . Lipoma of axilla 09/2016  . Liver lesion, right lobe 06/07/2015   2.5x2.4x2.4 cm hypoechoic lesion posterior aspect, noted on [place] ABD  . Low back pain 03/26/2016  . LVH (left ventricular hypertrophy) 08/06/2017   Moderate, noted on ECHO  . LVH (left ventricular hypertrophy) 08/06/2017   Moderate, Noted on ECHO  . Medicare annual wellness visit, subsequent 03/12/2014  . Muscle cramps   . Nocturia   . OA (osteoarthritis)    Back, Hands, Neck  . Obesity  11/25/2007   Qualifier: Diagnosis of  By: Lenna Gilford MD, Deborra Medina   . Other malaise and fatigue 03/13/2013  . Penis symptom or sign    PENIS RED NO DRAINAGE   . Premature ventricular complex   . Preventative health care 03/07/2015  . Renal insufficiency   . Scoliosis    Upper thoracic and lumbar  . Sinus bradycardia 08/06/2017   Noted on EKG  . Vitamin D deficiency    resolved    Past Surgical History:  Procedure Laterality Date  . CARDIOVERSION  07/31/2017  . CATARACT EXTRACTION, BILATERAL    . COLON SURGERY  2009   segmental sigmoid resection  . COLONOSCOPY    . CYSTOSCOPY W/ RETROGRADES Right 06/07/2018   Procedure: CYSTOSCOPY WITH RETROGRADE PYELOGRAM, uphrostogram;  Surgeon: Cleon Gustin, MD;  [facility];  Service: Urology;  Laterality: Right;  . CYSTOSCOPY WITH URETEROSCOPY AND STENT PLACEMENT Right 02/28/2018   Procedure: CYSTOSCOPY WITH URETEROSCOPY Concha Se;  Surgeon: Kathie Rhodes, MD;  Location: [facility];  Service: Urology;  Laterality: Right;  . CYSTOSCOPY/URETEROSCOPY/HOLMIUM LASER/STENT PLACEMENT Right 11/29/2017   Procedure: CYSTOSCOPY/RETROGRADE/URETEROSCOPY/HOLMIUM LASER/STENT PLACEMENT;  Surgeon: Kathie Rhodes, MD;  [facility];  Service: Urology;  Laterality: Right;  . EYE SURGERY Bilateral 01/12/2017   cataract removal  . HOLMIUM LASER APPLICATION Right 9/56/3875   Procedure: HOLMIUM LASER APPLICATION;  Surgeon: Kathie Rhodes, MD;  Location: [facility];  Service: Urology;  Laterality: Right;  . IR BALLOON DILATION URETERAL STRICTURE RIGHT  03/14/2018  . IR NEPHRO TUBE REMOV/FL  03/17/2018  . IR NEPHROSTOGRAM RIGHT THRU EXISTING ACCESS  03/17/2018  . IR NEPHROSTOMY EXCHANGE RIGHT  03/14/2018  . IR NEPHROSTOMY EXCHANGE RIGHT  06/21/2018  . IR NEPHROSTOMY PLACEMENT RIGHT  03/02/2018  . IR NEPHROSTOMY PLACEMENT RIGHT  04/20/2018  . IR URETERAL STENT PLACEMENT EXISTING ACCESS RIGHT  03/14/2018  . NOSE SURGERY     Submucous  resection age 45  . NUCLEAR STRESS TEST  06/03/2009  . ROBOT ASSISTED PYELOPLASTY Right 06/07/2018   Procedure: attempted XI ROBOTIC ASSISTED PYELOPLASTY, lysis of adhesions;  Surgeon: Cleon Gustin, MD;  [facility];  Service: Urology;  Laterality: Right;  3 HRS  . SKIN BIOPSY    . URETEROSCOPY WITH HOLMIUM LASER LITHOTRIPSY Right 09/24/2017   Procedure: CYSTOSCOPY, URETEROSCOPY WITH HOLMIUM LASER LITHOTRIPSY, STENT PLACEMENT;  Surgeon: Kathie Rhodes, MD;  Location: [facility];  Service: Urology;  Laterality: Right;    MEDICATIONS: . acetaminophen (TYLENOL) 500 MG tablet  . atenolol (TENORMIN) 25 MG tablet  . bisacodyl (DULCOLAX) 5 MG EC tablet  . carboxymethylcellulose (REFRESH PLUS) 0.5 % SOLN  . clotrimazole-betamethasone (LOTRISONE) cream  . Colchicine 0.6 MG CAPS  . cyclobenzaprine (FLEXERIL) 5 MG tablet  . FLUoxetine (PROZAC) 10 MG tablet  . fluticasone (FLONASE) 50 MCG/ACT nasal spray  .  HYDROcodone-acetaminophen (NORCO) 5-325 MG tablet  . ketoconazole (NIZORAL) 2 % cream  . LORazepam (ATIVAN) 1 MG tablet  . Menthol, Topical Analgesic, (ICY HOT BACK EX)  . omeprazole (PRILOSEC) 20 MG capsule   . magnesium citrate solution 1 Bottle    Maia Plan [facility] Pre[facility]Surgical Testing [phone] 08/03/18  3:15 PM

## 2018-08-04 ENCOUNTER — Encounter (HOSPITAL_COMMUNITY): Payer: Self-pay

## 2018-08-04 ENCOUNTER — Encounter (HOSPITAL_COMMUNITY)
Admission: RE | Disposition: A | Discharge status: Home / Self Care | Payer: Self-pay | Source: Ambulatory Visit | Attending: Urology

## 2018-08-04 ENCOUNTER — Inpatient Hospital Stay (HOSPITAL_COMMUNITY)
Admission: RE | Admit: 2018-08-04 | Discharge: 2018-08-07 | DRG: 661 | Disposition: A | Discharge status: Home / Self Care | Payer: Medicare Other | Attending: Urology | Admitting: Urology

## 2018-08-04 ENCOUNTER — Inpatient Hospital Stay (HOSPITAL_COMMUNITY): Payer: Medicare Other | Admitting: Physician Assistant

## 2018-08-04 ENCOUNTER — Inpatient Hospital Stay (HOSPITAL_COMMUNITY): Payer: Medicare Other | Admitting: Certified Registered Nurse Anesthetist

## 2018-08-04 DIAGNOSIS — K660 Peritoneal adhesions (postprocedural) (postinfection): Secondary | ICD-10-CM | POA: Diagnosis present

## 2018-08-04 DIAGNOSIS — I4891 Unspecified atrial fibrillation: Secondary | ICD-10-CM | POA: Diagnosis present

## 2018-08-04 DIAGNOSIS — Z87891 Personal history of nicotine dependence: Secondary | ICD-10-CM | POA: Diagnosis not present

## 2018-08-04 DIAGNOSIS — Z85828 Personal history of other malignant neoplasm of skin: Secondary | ICD-10-CM | POA: Diagnosis not present

## 2018-08-04 DIAGNOSIS — E785 Hyperlipidemia, unspecified: Secondary | ICD-10-CM | POA: Diagnosis present

## 2018-08-04 DIAGNOSIS — Z20828 Contact with and (suspected) exposure to other viral communicable diseases: Secondary | ICD-10-CM | POA: Diagnosis present

## 2018-08-04 DIAGNOSIS — N481 Balanitis: Secondary | ICD-10-CM | POA: Diagnosis present

## 2018-08-04 DIAGNOSIS — K760 Fatty (change of) liver, not elsewhere classified: Secondary | ICD-10-CM | POA: Diagnosis present

## 2018-08-04 DIAGNOSIS — Z79899 Other long term (current) drug therapy: Secondary | ICD-10-CM | POA: Diagnosis not present

## 2018-08-04 DIAGNOSIS — K219 Gastro-esophageal reflux disease without esophagitis: Secondary | ICD-10-CM | POA: Diagnosis present

## 2018-08-04 DIAGNOSIS — F329 Major depressive disorder, single episode, unspecified: Secondary | ICD-10-CM | POA: Diagnosis present

## 2018-08-04 DIAGNOSIS — N289 Disorder of kidney and ureter, unspecified: Secondary | ICD-10-CM | POA: Diagnosis present

## 2018-08-04 DIAGNOSIS — I10 Essential (primary) hypertension: Secondary | ICD-10-CM | POA: Diagnosis present

## 2018-08-04 DIAGNOSIS — H9193 Unspecified hearing loss, bilateral: Secondary | ICD-10-CM | POA: Diagnosis present

## 2018-08-04 HISTORY — PX: ROBOT ASSISTED LAPAROSCOPIC NEPHRECTOMY: SHX5140

## 2018-08-04 LAB — TYPE AND SCREEN
ABO/RH(D): O NEG
Antibody Screen: NEGATIVE

## 2018-08-04 LAB — HEMOGLOBIN AND HEMATOCRIT, BLOOD
HCT: 42.2 % (ref 39.0–52.0)
Hemoglobin: 13.7 g/dL (ref 13.0–17.0)

## 2018-08-04 SURGERY — NEPHRECTOMY, RADICAL, ROBOT-ASSISTED, LAPAROSCOPIC, ADULT
Anesthesia: General | Laterality: Right

## 2018-08-04 MED ORDER — DIPHENHYDRAMINE HCL 50 MG/ML IJ SOLN
12.5000 mg | Freq: Four times a day (QID) | INTRAMUSCULAR | Status: DC | PRN
  Administered 2018-08-04: 12.5 mg via INTRAVENOUS

## 2018-08-04 MED ORDER — DEXTROSE-NACL 5-0.45 % IV SOLN
INTRAVENOUS | Status: DC
  Administered 2018-08-04 – 2018-08-06 (×3): via INTRAVENOUS

## 2018-08-04 MED ORDER — OXYCODONE HCL 5 MG PO TABS: 5.0000 mg | ORAL_TABLET | Freq: Once | ORAL | Status: DC | PRN

## 2018-08-04 MED ORDER — BACITRACIN-NEOMYCIN-POLYMYXIN 400-5-5000 EX OINT: 1.0000 "application " | TOPICAL_OINTMENT | Freq: Three times a day (TID) | CUTANEOUS | Status: DC | PRN

## 2018-08-04 MED ORDER — HYDROMORPHONE HCL 1 MG/ML IJ SOLN
INTRAMUSCULAR | Status: AC
  Filled 2018-08-04: qty 2

## 2018-08-04 MED ORDER — CEFAZOLIN SODIUM-DEXTROSE 2-4 GM/100ML-% IV SOLN: 2.0000 g | INTRAVENOUS | Status: DC

## 2018-08-04 MED ORDER — ATENOLOL 25 MG PO TABS
25.0000 mg | ORAL_TABLET | Freq: Two times a day (BID) | ORAL | Status: DC
  Administered 2018-08-04 – 2018-08-07 (×6): 25 mg via ORAL
  Filled 2018-08-04 (×6): qty 1

## 2018-08-04 MED ORDER — HYDROCODONE-ACETAMINOPHEN 5-325 MG PO TABS
1.0000 | ORAL_TABLET | ORAL | Status: DC | PRN
  Administered 2018-08-04: 1 via ORAL
  Administered 2018-08-04: 2 via ORAL
  Administered 2018-08-05 (×2): 1 via ORAL
  Filled 2018-08-04: qty 1
  Filled 2018-08-04: qty 2
  Filled 2018-08-04 (×3): qty 1

## 2018-08-04 MED ORDER — HYDROMORPHONE HCL 1 MG/ML IJ SOLN
0.2500 mg | INTRAMUSCULAR | Status: DC | PRN
  Administered 2018-08-04 (×4): 0.5 mg via INTRAVENOUS

## 2018-08-04 MED ORDER — GENTAMICIN SULFATE 40 MG/ML IJ SOLN
500.0000 mg | Freq: Once | INTRAVENOUS | Status: AC
  Administered 2018-08-04: 500 mg via INTRAVENOUS
  Filled 2018-08-04: qty 12.5

## 2018-08-04 MED ORDER — LIDOCAINE 2% (20 MG/ML) 5 ML SYRINGE
INTRAMUSCULAR | Status: DC | PRN
  Administered 2018-08-04: 80 mg via INTRAVENOUS

## 2018-08-04 MED ORDER — EPHEDRINE SULFATE-NACL 50-0.9 MG/10ML-% IV SOSY
PREFILLED_SYRINGE | INTRAVENOUS | Status: DC | PRN
  Administered 2018-08-04 (×5): 10 mg via INTRAVENOUS

## 2018-08-04 MED ORDER — LACTATED RINGERS IV SOLN
INTRAVENOUS | Status: DC
  Administered 2018-08-04 (×2): via INTRAVENOUS
  Administered 2018-08-04: 1000 mL via INTRAVENOUS

## 2018-08-04 MED ORDER — ALBUMIN HUMAN 5 % IV SOLN
INTRAVENOUS | Status: AC
  Filled 2018-08-04: qty 250

## 2018-08-04 MED ORDER — BISACODYL 5 MG PO TBEC: 5.0000 mg | DELAYED_RELEASE_TABLET | Freq: Every day | ORAL | Status: DC | PRN

## 2018-08-04 MED ORDER — FENTANYL CITRATE (PF) 100 MCG/2ML IJ SOLN
INTRAMUSCULAR | Status: DC | PRN
  Administered 2018-08-04: 100 ug via INTRAVENOUS

## 2018-08-04 MED ORDER — DEXAMETHASONE SODIUM PHOSPHATE 10 MG/ML IJ SOLN
INTRAMUSCULAR | Status: DC | PRN
  Administered 2018-08-04: 5 mg via INTRAVENOUS

## 2018-08-04 MED ORDER — SUCCINYLCHOLINE CHLORIDE 20 MG/ML IJ SOLN
INTRAMUSCULAR | Status: DC | PRN
  Administered 2018-08-04: 120 mg via INTRAVENOUS

## 2018-08-04 MED ORDER — PROMETHAZINE HCL 25 MG/ML IJ SOLN: 6.2500 mg | INTRAMUSCULAR | Status: DC | PRN

## 2018-08-04 MED ORDER — ACETAMINOPHEN 10 MG/ML IV SOLN
1000.0000 mg | Freq: Four times a day (QID) | INTRAVENOUS | Status: AC
  Administered 2018-08-04 – 2018-08-05 (×3): 1000 mg via INTRAVENOUS
  Filled 2018-08-04 (×3): qty 100

## 2018-08-04 MED ORDER — PANTOPRAZOLE SODIUM 40 MG PO TBEC
40.0000 mg | DELAYED_RELEASE_TABLET | Freq: Every day | ORAL | Status: DC
  Administered 2018-08-05 – 2018-08-07 (×3): 40 mg via ORAL
  Filled 2018-08-04 (×3): qty 1

## 2018-08-04 MED ORDER — BUPIVACAINE LIPOSOME 1.3 % IJ SUSP
20.0000 mL | Freq: Once | INTRAMUSCULAR | Status: AC
  Administered 2018-08-04: 20 mL
  Filled 2018-08-04: qty 20

## 2018-08-04 MED ORDER — SODIUM CHLORIDE (PF) 0.9 % IJ SOLN
INTRAMUSCULAR | Status: AC
  Filled 2018-08-04: qty 50

## 2018-08-04 MED ORDER — HYDROCODONE-ACETAMINOPHEN 5-325 MG PO TABS: 1.0000 | ORAL_TABLET | Freq: Four times a day (QID) | ORAL | 0 refills | Status: DC | PRN

## 2018-08-04 MED ORDER — LORAZEPAM 0.5 MG PO TABS
0.5000 mg | ORAL_TABLET | Freq: Two times a day (BID) | ORAL | Status: DC
  Administered 2018-08-04 – 2018-08-07 (×6): 0.5 mg via ORAL
  Filled 2018-08-04 (×6): qty 1

## 2018-08-04 MED ORDER — SODIUM CHLORIDE (PF) 0.9 % IJ SOLN
INTRAMUSCULAR | Status: DC | PRN
  Administered 2018-08-04: 20 mL

## 2018-08-04 MED ORDER — DIPHENHYDRAMINE HCL 12.5 MG/5ML PO ELIX
12.5000 mg | ORAL_SOLUTION | Freq: Four times a day (QID) | ORAL | Status: DC | PRN
  Filled 2018-08-04: qty 5

## 2018-08-04 MED ORDER — MEPERIDINE HCL 50 MG/ML IJ SOLN: 6.2500 mg | INTRAMUSCULAR | Status: DC | PRN

## 2018-08-04 MED ORDER — HYDROMORPHONE HCL 1 MG/ML IJ SOLN
0.5000 mg | INTRAMUSCULAR | Status: DC | PRN
  Administered 2018-08-05: 0.5 mg via INTRAVENOUS
  Filled 2018-08-04: qty 1

## 2018-08-04 MED ORDER — ROCURONIUM BROMIDE 10 MG/ML (PF) SYRINGE
PREFILLED_SYRINGE | INTRAVENOUS | Status: DC | PRN
  Administered 2018-08-04: 20 mg via INTRAVENOUS
  Administered 2018-08-04: 60 mg via INTRAVENOUS
  Administered 2018-08-04 (×2): 20 mg via INTRAVENOUS

## 2018-08-04 MED ORDER — PROPOFOL 10 MG/ML IV BOLUS
INTRAVENOUS | Status: AC
  Filled 2018-08-04: qty 20

## 2018-08-04 MED ORDER — DIPHENHYDRAMINE HCL 50 MG/ML IJ SOLN
INTRAMUSCULAR | Status: AC
  Filled 2018-08-04: qty 1

## 2018-08-04 MED ORDER — ONDANSETRON HCL 4 MG/2ML IJ SOLN
INTRAMUSCULAR | Status: DC | PRN
  Administered 2018-08-04: 4 mg via INTRAVENOUS

## 2018-08-04 MED ORDER — ONDANSETRON HCL 4 MG/2ML IJ SOLN: 4.0000 mg | INTRAMUSCULAR | Status: DC | PRN

## 2018-08-04 MED ORDER — SUGAMMADEX SODIUM 200 MG/2ML IV SOLN
INTRAVENOUS | Status: DC | PRN
  Administered 2018-08-04: 200 mg via INTRAVENOUS

## 2018-08-04 MED ORDER — ALBUMIN HUMAN 5 % IV SOLN
INTRAVENOUS | Status: DC | PRN
  Administered 2018-08-04: 09:00:00 via INTRAVENOUS

## 2018-08-04 MED ORDER — BELLADONNA ALKALOIDS-OPIUM 16.2-60 MG RE SUPP
1.0000 | Freq: Four times a day (QID) | RECTAL | Status: DC | PRN
  Administered 2018-08-04: 1 via RECTAL
  Filled 2018-08-04: qty 1

## 2018-08-04 MED ORDER — OXYCODONE HCL 5 MG/5ML PO SOLN: 5.0000 mg | Freq: Once | ORAL | Status: DC | PRN

## 2018-08-04 MED ORDER — FLUOXETINE HCL 10 MG PO CAPS
10.0000 mg | ORAL_CAPSULE | Freq: Two times a day (BID) | ORAL | Status: DC
  Administered 2018-08-04 – 2018-08-07 (×6): 10 mg via ORAL
  Filled 2018-08-04 (×6): qty 1

## 2018-08-04 MED ORDER — FENTANYL CITRATE (PF) 250 MCG/5ML IJ SOLN
INTRAMUSCULAR | Status: AC
  Filled 2018-08-04: qty 5

## 2018-08-04 MED ORDER — PROPOFOL 10 MG/ML IV BOLUS
INTRAVENOUS | Status: DC | PRN
  Administered 2018-08-04: 160 mg via INTRAVENOUS

## 2018-08-04 SURGICAL SUPPLY — 59 items
APPLICATOR COTTON TIP 6 STRL (MISCELLANEOUS) ×1 IMPLANT
APPLICATOR COTTON TIP 6IN STRL (MISCELLANEOUS) ×2
BAG LAPAROSCOPIC 12 15 PORT 16 (BASKET) ×1 IMPLANT
BAG RETRIEVAL 12/15 (BASKET) ×2
CHLORAPREP W/TINT 26 (MISCELLANEOUS) ×2 IMPLANT
CLIP VESOLOCK LG 6/CT PURPLE (CLIP) ×4 IMPLANT
CLIP VESOLOCK MED LG 6/CT (CLIP) ×2 IMPLANT
CLIP VESOLOCK XL 6/CT (CLIP) ×2 IMPLANT
COVER SURGICAL LIGHT HANDLE (MISCELLANEOUS) ×4 IMPLANT
COVER TIP SHEARS 8 DVNC (MISCELLANEOUS) ×1 IMPLANT
COVER TIP SHEARS 8MM DA VINCI (MISCELLANEOUS) ×1
COVER WAND RF STERILE (DRAPES) IMPLANT
CUTTER ECHEON FLEX ENDO 45 340 (ENDOMECHANICALS) ×2 IMPLANT
DECANTER SPIKE VIAL GLASS SM (MISCELLANEOUS) ×2 IMPLANT
DERMABOND ADVANCED (GAUZE/BANDAGES/DRESSINGS) ×1
DERMABOND ADVANCED .7 DNX12 (GAUZE/BANDAGES/DRESSINGS) ×1 IMPLANT
DRAPE ARM DVNC X/XI (DISPOSABLE) ×4 IMPLANT
DRAPE COLUMN DVNC XI (DISPOSABLE) ×1 IMPLANT
DRAPE DA VINCI XI ARM (DISPOSABLE) ×4
DRAPE DA VINCI XI COLUMN (DISPOSABLE) ×1
DRAPE INCISE IOBAN 66X45 STRL (DRAPES) ×2 IMPLANT
DRAPE SHEET LG 3/4 BI-LAMINATE (DRAPES) ×2 IMPLANT
DRSG TEGADERM 4X4.75 (GAUZE/BANDAGES/DRESSINGS) ×2 IMPLANT
ELECT PENCIL ROCKER SW 15FT (MISCELLANEOUS) ×2 IMPLANT
ELECT REM PT RETURN 15FT ADLT (MISCELLANEOUS) ×2 IMPLANT
GAUZE SPONGE 2X2 8PLY STRL LF (GAUZE/BANDAGES/DRESSINGS) ×1 IMPLANT
GLOVE BIO SURGEON STRL SZ 6.5 (GLOVE) ×4 IMPLANT
GLOVE BIO SURGEON STRL SZ8 (GLOVE) ×4 IMPLANT
GLOVE BIOGEL PI IND STRL 8 (GLOVE) ×2 IMPLANT
GLOVE BIOGEL PI INDICATOR 8 (GLOVE) ×2
GOWN STRL REUS W/TWL LRG LVL3 (GOWN DISPOSABLE) ×6 IMPLANT
HOLDER FOLEY CATH W/STRAP (MISCELLANEOUS) ×2 IMPLANT
IRRIG SUCT STRYKERFLOW 2 WTIP (MISCELLANEOUS) ×2
IRRIGATION SUCT STRKRFLW 2 WTP (MISCELLANEOUS) ×1 IMPLANT
KIT BASIN OR (CUSTOM PROCEDURE TRAY) ×2 IMPLANT
KIT TURNOVER KIT A (KITS) ×2 IMPLANT
LOOP VESSEL MAXI BLUE (MISCELLANEOUS) IMPLANT
NEEDLE INSUFFLATION 14GA 120MM (NEEDLE) ×2 IMPLANT
PROTECTOR NERVE ULNAR (MISCELLANEOUS) ×4 IMPLANT
SEAL CANN UNIV 5-8 DVNC XI (MISCELLANEOUS) ×4 IMPLANT
SEAL XI 5MM-8MM UNIVERSAL (MISCELLANEOUS) ×4
SET TUBE SMOKE EVAC HIGH FLOW (TUBING) ×2 IMPLANT
SOLUTION ELECTROLUBE (MISCELLANEOUS) ×2 IMPLANT
SPONGE GAUZE 2X2 STER 10/PKG (GAUZE/BANDAGES/DRESSINGS) ×1
SPONGE LAP 4X18 RFD (DISPOSABLE) IMPLANT
STAPLE RELOAD 45 WHT (STAPLE) ×2 IMPLANT
STAPLE RELOAD 45MM WHITE (STAPLE) ×2
SUT ETHILON 3 0 PS 1 (SUTURE) IMPLANT
SUT MNCRL AB 4-0 PS2 18 (SUTURE) ×4 IMPLANT
SUT PDS AB 1 TP1 96 (SUTURE) IMPLANT
SUT VICRYL 0 UR6 27IN ABS (SUTURE) ×4 IMPLANT
TAPE CLOTH 4X10 WHT NS (GAUZE/BANDAGES/DRESSINGS) ×2 IMPLANT
TOWEL OR 17X26 10 PK STRL BLUE (TOWEL DISPOSABLE) ×2 IMPLANT
TOWEL OR NON WOVEN STRL DISP B (DISPOSABLE) ×2 IMPLANT
TRAY FOLEY MTR SLVR 16FR STAT (SET/KITS/TRAYS/PACK) ×2 IMPLANT
TRAY LAPAROSCOPIC (CUSTOM PROCEDURE TRAY) ×2 IMPLANT
TROCAR BLADELESS OPT 5 100 (ENDOMECHANICALS) ×2 IMPLANT
TROCAR XCEL 12X100 BLDLESS (ENDOMECHANICALS) ×2 IMPLANT
WATER STERILE IRR 1000ML POUR (IV SOLUTION) ×2 IMPLANT

## 2018-08-04 NOTE — Op Note (Signed)
Preoperative diagnosis: No functioning right kidney  Postop diagnosis: Same  Procedure: 1.  right robot assisted laparoscopic radical nephrectomy 2.  Lysis of adhesions, extensive   Attending: Nicolette Bang, MD  Assistant: Debbrah Alar, PA  Anesthesia: General  Estimated blood loss: 100 cc  Drains: 16 French Foley catheter  Specimens: right simple nephrectomy  Antibiotics: ancef  Findings: 2 renal arteries, 1 renal vein. Extentensive anterior abdominal wall adhesions required 45 minutes to remove. The assistant was utilized for retraction, suction, and deploying clips/staples on hilar vessels  Indications: Patient is a 73 year old with a history of a nonfunctioning right kidney.    After discussing treatment options patient decided to proceed with right robot assisted laparoscopic simple nephrectomy.  Procedure in detail: Prior to procedure consent was obtained. Patient was brought to the operating room and briefing was done sure correct patient, correct procedure, correct site.  General anesthesia was in administered patient was placed in the left lateral decubitus position.  a 80 French catheter was placed. their abdomen and flank was then prepped and draped usual sterile fashion.  A Veress needle was used to obtain pneumoperitoneum.  Once pneumoperitoneum was reestablished to 15 mmHg we then placed a 12 mm camera port lateral to the umbilicus at the lateral edge of rectus.  We then proceeded to place 3 more robotic ports. We then placed an assistant port inferior to the camera in the midline. We then placed a 54mm port for the liver retractor in the midline above the assistant port. We then docked the robot.  We then started this dissection by removing a extensive amount of anterior abdominal wall adhesions and adhesions to the liver.  We then dissected along the white line of [place].  We then reflected the colon medially.  We then proceeded to kocherize the duodenum. We then identified  the psoas muscle.  Once this was done we traced it down to the iliac vessels and identified the ureter.  Once we identified the gonadal vein and ureter were then traced this to the renal hilum.  The renal vein and renal artery were skeletonized.  We did we identified one renal vein one renal artery.  Using the [employer] power stapler within ligated the renal artery.  Once this was done we then used a second staple load to ligate the renal vein.  We then used a hem-o-lock clips to ligate the gonadal vein and the ureter.  Once this was done we then freed the kidney from its lateral and posterior attachments.  We then used a Endo Catch bag to remove the specimen.  Once the specimen was in the Endo Catch bag we then inspected the retroperitoneum and noted no residual bleeding.  We then removed our instruments, undocked the robot, and released the pneumoperitoneum.  We then made a right lower quardant incision to remove the specimen.  Once the specimen was removed we then closed the camera and assistant ports with 0 Vicryl in interrupted fashion.  We then closed the  Right lower quadrant incision with 0 PDS in a running fashion.  We then closed the overlying skin with 2-0 Vicryl in running fashion.  The skin was then closed with staples.  This then concluded the procedure which was well tolerated by the patient.  Complications: None  Condition: Stable, x-rayed, transferred to PACU.  Plan: Patient is to be admitted for inpatient stay. The foley catheter will be removed in the morning. They will be started on a clear liquid diet POD#1

## 2018-08-04 NOTE — Anesthesia Postprocedure Evaluation (Signed)
Anesthesia Post Note  Patient: DEWEL LOTTER  Procedure(s) Performed: XI ROBOTIC ASSISTED LAPAROSCOPIC NEPHRECTOMY (Right )     Patient location during evaluation: PACU Anesthesia Type: General Level of consciousness: awake and alert Pain management: pain level controlled Vital Signs Assessment: post-procedure vital signs reviewed and stable Respiratory status: spontaneous breathing, nonlabored ventilation and respiratory function stable Cardiovascular status: blood pressure returned to baseline and stable Postop Assessment: no apparent nausea or vomiting Anesthetic complications: no    Last Vitals:  Vitals:   08/04/18 1130 08/04/18 1145  BP: (!) 163/85 (!) 162/89  Pulse: 78 79  Resp: 12 12  Temp: (!) 35.9 C   SpO2: 100% 100%    Last Pain:  Vitals:   08/04/18 1200  TempSrc:   PainSc: Asleep                 Lynda Rainwater

## 2018-08-04 NOTE — Discharge Instructions (Signed)

## 2018-08-04 NOTE — H&P (Signed)
Urology Admission H&P  Chief Complaint: right flank pain  History of Present Illness: Mr David [name] a 73ZH with a hx of right ureteral stricture and a poorly function right kidney. He has intermittent right flank pain and his kidney is managed with a right  Nephrostomy tube. He denies any fevers/chills/sweats. No nausea/vomtiting  Past Medical History:  Diagnosis Date  . Abnormal liver function 03/18/2013  . Adenoma of right adrenal gland 07/11/2002   2.4cm , noted on CT ABD  . Anxiety   . Arthritis of both knees 03/26/2016  . Astigmatism   . Atrial fibrillation ([place])    x1 successful cardioversions  . Back pain   . BCC (basal cell carcinoma of skin)    under right eye and right ear  . Cataract 09/29/2016  . Depression   . Diverticulitis   . Diverticulosis   . Fatty liver   . GERD (gastroesophageal reflux disease)   . Gout   . Hearing loss of both ears 03/26/2016  . History of chronic prostatitis    started at age 47  . History of kidney stones   . Hyperglycemia 03/29/2016  . Hyperlipidemia   . Hypertension   . IBS (irritable bowel syndrome) 03/18/2013  . Incomplete right bundle branch block (RBBB)   . Internal hemorrhoids   . Kidney lesion 06/07/2015   Right midportion, 1.1x1.1 cm hyperechoic, noted on [place] ABD  . LAFB (left anterior fascicular block)   . Left anterior fascicular block 08/06/2017   Noted on EKG  . Lipoma of axilla 09/2016  . Liver lesion, right lobe 06/07/2015   2.5x2.4x2.4 cm hypoechoic lesion posterior aspect, noted on [place] ABD  . Low back pain 03/26/2016  . LVH (left ventricular hypertrophy) 08/06/2017   Moderate, noted on ECHO  . LVH (left ventricular hypertrophy) 08/06/2017   Moderate, Noted on ECHO  . Medicare annual wellness visit, subsequent 03/12/2014  . Muscle cramps   . Nocturia   . OA (osteoarthritis)    Back, Hands, Neck  . Obesity 11/25/2007   Qualifier: Diagnosis of  By: Lenna Gilford MD, Deborra Medina   . Other malaise and fatigue 03/13/2013  . Penis symptom  or sign    PENIS RED NO DRAINAGE   . Premature ventricular complex   . Preventative health care 03/07/2015  . Renal insufficiency   . Scoliosis    Upper thoracic and lumbar  . Sinus bradycardia 08/06/2017   Noted on EKG  . Vitamin D deficiency    resolved   Past Surgical History:  Procedure Laterality Date  . CARDIOVERSION  07/31/2017  . CATARACT EXTRACTION, BILATERAL    . COLON SURGERY  2009   segmental sigmoid resection  . COLONOSCOPY    . CYSTOSCOPY W/ RETROGRADES Right 06/07/2018   Procedure: CYSTOSCOPY WITH RETROGRADE PYELOGRAM, uphrostogram;  Surgeon: Cleon Gustin, MD;  [facility];  Service: Urology;  Laterality: Right;  . CYSTOSCOPY WITH URETEROSCOPY AND STENT PLACEMENT Right 02/28/2018   Procedure: CYSTOSCOPY WITH URETEROSCOPY Concha Se;  Surgeon: Kathie Rhodes, MD;  Location: [facility];  Service: Urology;  Laterality: Right;  . CYSTOSCOPY/URETEROSCOPY/HOLMIUM LASER/STENT PLACEMENT Right 11/29/2017   Procedure: CYSTOSCOPY/RETROGRADE/URETEROSCOPY/HOLMIUM LASER/STENT PLACEMENT;  Surgeon: Kathie Rhodes, MD;  [facility];  Service: Urology;  Laterality: Right;  . EYE SURGERY Bilateral 01/12/2017   cataract removal  . HOLMIUM LASER APPLICATION Right 0/86/5784   Procedure: HOLMIUM LASER APPLICATION;  Surgeon: Kathie Rhodes, MD;  Location: [facility];  Service: Urology;  Laterality: Right;  .  IR BALLOON DILATION URETERAL STRICTURE RIGHT  03/14/2018  . IR NEPHRO TUBE REMOV/FL  03/17/2018  . IR NEPHROSTOGRAM RIGHT THRU EXISTING ACCESS  03/17/2018  . IR NEPHROSTOMY EXCHANGE RIGHT  03/14/2018  . IR NEPHROSTOMY EXCHANGE RIGHT  06/21/2018  . IR NEPHROSTOMY PLACEMENT RIGHT  03/02/2018  . IR NEPHROSTOMY PLACEMENT RIGHT  04/20/2018  . IR URETERAL STENT PLACEMENT EXISTING ACCESS RIGHT  03/14/2018  . NOSE SURGERY     Submucous resection age 89  . NUCLEAR STRESS TEST  06/03/2009  . ROBOT ASSISTED PYELOPLASTY Right 06/07/2018   Procedure: attempted XI  ROBOTIC ASSISTED PYELOPLASTY, lysis of adhesions;  Surgeon: Cleon Gustin, MD;  [facility];  Service: Urology;  Laterality: Right;  3 HRS  . SKIN BIOPSY    . URETEROSCOPY WITH HOLMIUM LASER LITHOTRIPSY Right 09/24/2017   Procedure: CYSTOSCOPY, URETEROSCOPY WITH HOLMIUM LASER LITHOTRIPSY, STENT PLACEMENT;  Surgeon: Kathie Rhodes, MD;  Location[facility];  Service: Urology;  Laterality: Right;    Home Medications:  Current Facility-Administered Medications  Medication Dose Route Frequency Provider Last Rate Last Dose  . bupivacaine liposome (EXPAREL) 1.3 % injection 266 mg  20 mL Infiltration Once Cleon Gustin, MD      . ceFAZolin (ANCEF) IVPB 2g/100 mL premix  2 g Intravenous 30 min Pre-Op McKenzie, Candee Furbish, MD      . lactated ringers infusion   Intravenous Continuous Alyson Ingles Candee Furbish, MD 10 mL/hr at 08/04/18 6301     Allergies:  Allergies  Allergen Reactions  . Cefaclor Hives  . Cephalosporins Hives  . Penicillins Rash    Mild maculopapular rash Has patient had a PCN reaction causing immediate rash, facial/tongue/throat swelling, SOB or lightheadedness with hypotension: No Has patient had a PCN reaction causing severe rash involving mucus membranes or skin necrosis: No Has patient had a PCN reaction that required hospitalization: No Has patient had a PCN reaction occurring within the last 10 years: No If all of the above answers are "NO", then may proceed with Cephalosporin use.     Family History  Problem Relation Age of Onset  . Heart disease Mother   . Hypertension Mother   . Stroke Mother   . Colon cancer Mother   . Breast cancer Mother   . Heart disease Father        pacemaker  . Aortic aneurysm Father   . Hypertension Father   . Heart disease Sister   . Atrial fibrillation Sister   . Obesity Sister   . Sleep apnea Sister   . Heart attack Brother   . Other Brother        muscle disease  . Arthritis Brother   . Stroke Brother    . Atrial fibrillation Brother   . Cancer Maternal Grandmother        ?  Marland Kitchen Heart attack Maternal Grandmother   . Diabetes Maternal Grandmother   . Cancer Maternal Grandfather        hodgin's lymphoma  . Heart attack Paternal Grandmother   . Anxiety disorder Paternal Grandmother   . Pneumonia Paternal Grandfather   . Heart attack Brother   . Atrial fibrillation Brother   . Atrial fibrillation Brother   . Heart attack Brother   . Hypertension Brother   . Hyperlipidemia Brother   . Heart attack Brother   . Other Brother        heart valve operation  . Atrial fibrillation Brother    Social History:  reports that  he has quit smoking. His smoking use included cigarettes. He started smoking about 49 years ago. He has a 6.00 pack-year smoking history. He has never used smokeless tobacco. He reports previous alcohol use. He reports that he does not use drugs.  Review of Systems  Genitourinary: Positive for flank pain.  All other systems reviewed and are negative.   Physical Exam:  Vital signs in last 24 hours: Temp:  [98.2 F (36.8 C)-98.7 F (37.1 C)] 98.7 F (37.1 C) (07/23 0538) Pulse Rate:  [51-62] 62 (07/23 0538) Resp:  [18] 18 (07/23 0538) BP: (131-140)/(76-78) 131/78 (07/23 0538) SpO2:  [97 %-98 %] 97 % (07/23 0538) Weight:  [97.5 kg] 97.5 kg (07/23 0538) Physical Exam  Constitutional: He is oriented to person, place, and time. He appears well-developed and well-nourished.  HENT:  Head: Normocephalic and atraumatic.  Eyes: Pupils are equal, round, and reactive to light. EOM are normal.  Neck: Normal range of motion. No thyromegaly present.  Cardiovascular: Normal rate and regular rhythm.  Respiratory: Effort normal. No respiratory distress.  GI: Soft. He exhibits no distension. There is no abdominal tenderness.  Musculoskeletal: Normal range of motion.        General: No edema.  Neurological: He is alert and oriented to person, place, and time.  Skin: Skin is warm  and dry.  Psychiatric: He has a normal mood and affect. His behavior is normal. Judgment and thought content normal.    Laboratory Data:  Results for orders placed or performed during the hospital encounter of 08/03/18 (from the past 24 hour(s))  Basic metabolic panel     Status: Abnormal   Collection Time: 08/03/18  9:30 AM  Result Value Ref Range   Sodium 137 135 - 145 mmol/L   Potassium 4.3 3.5 - 5.1 mmol/L   Chloride 104 98 - 111 mmol/L   CO2 24 22 - 32 mmol/L   Glucose, Bld 91 70 - 99 mg/dL   BUN 22 8 - 23 mg/dL   Creatinine, Ser 1.76 (H) 0.61 - 1.24 mg/dL   Calcium 10.0 8.9 - 10.3 mg/dL   GFR calc non Af Amer 38 (L) >60 mL/min   GFR calc Af Amer 43 (L) >60 mL/min   Anion gap 9 5 - 15  CBC     Status: None   Collection Time: 08/03/18  9:30 AM  Result Value Ref Range   WBC 5.9 4.0 - 10.5 K/uL   RBC 4.61 4.22 - 5.81 MIL/uL   Hemoglobin 13.9 13.0 - 17.0 g/dL   HCT 42.7 39.0 - 52.0 %   MCV 92.6 80.0 - 100.0 fL   MCH 30.2 26.0 - 34.0 pg   MCHC 32.6 30.0 - 36.0 g/dL   RDW 13.3 11.5 - 15.5 %   Platelets 212 150 - 400 K/uL   nRBC 0.0 0.0 - 0.2 %   Recent Results (from the past 240 hour(s))  SARS Coronavirus 2 (Performed in [place] hospital lab)     Status: None   Collection Time: 08/01/18  8:53 AM   Specimen: Nasal Swab  Result Value Ref Range Status   SARS Coronavirus 2 NEGATIVE NEGATIVE Final    Comment: (NOTE) SARS-CoV-2 target nucleic acids are NOT DETECTED. The SARS-CoV-2 RNA is generally detectable in upper and lower respiratory specimens during the acute phase of infection. Negative results do not preclude SARS-CoV-2 infection, do not rule out co-infections with other pathogens, and should not be used as the sole basis for treatment or other  patient management decisions. Negative results must be combined with clinical observations, patient history, and epidemiological information. The expected result is Negative. Fact Sheet for  Patients: SugarRoll.be Fact Sheet for Healthcare Providers: https://www.woods-mathews.com/ This test is not yet approved or cleared by the [place] FDA and  has been authorized for detection and/or diagnosis of SARS-CoV-2 by FDA under an Emergency Use Authorization (EUA). This EUA will remain  in effect (meaning this test can be used) for the duration of the COVID-19 declaration under Section 56 4(b)(1) of the Act, 21 U.S.C. section 360bbb-3(b)(1), unless the authorization is terminated or revoked sooner. Performed at [facility], [place] [address], [place], [place] [address]    Creatinine: Recent Labs    08/03/18 0930  CREATININE 1.76*   Baseline Creatinine: 1.7  Impression/Assessment:  73yo with a right poorly functioning kindey  Plan:  The risks/benefits/alternatives to robotic right simple nephrectomy was explained to the patient and he understands and wishes to proceed with surgery  Nicolette Bang 08/04/2018, 7:36 AM

## 2018-08-04 NOTE — Progress Notes (Signed)
Patients wife called and updated.

## 2018-08-04 NOTE — Transfer of Care (Signed)
Immediate Anesthesia Transfer of Care Note  Patient: David [name]  Procedure(s) Performed: XI ROBOTIC ASSISTED LAPAROSCOPIC NEPHRECTOMY (Right )  Patient Location: PACU  Anesthesia Type:General  Level of Consciousness: awake, alert  and patient cooperative  Airway & Oxygen Therapy: Patient Spontanous Breathing and Patient connected to face mask oxygen  Post-op Assessment: Report given to RN and Post -op Vital signs reviewed and stable  Post vital signs: Reviewed and stable  Last Vitals:  Vitals Value Taken Time  BP 154/78 08/04/18 1049  Temp    Pulse 78 08/04/18 1052  Resp 19 08/04/18 1052  SpO2 100 % 08/04/18 1052  Vitals shown include unvalidated device data.  Last Pain:  Vitals:   08/04/18 0611  TempSrc:   PainSc: 0-No pain         Complications: No apparent anesthesia complications

## 2018-08-04 NOTE — Anesthesia Procedure Notes (Signed)
Procedure Name: Intubation Performed by: Gean Maidens, CRNA Pre-anesthesia Checklist: Patient identified, Emergency Drugs available, Suction available, Patient being monitored and Timeout performed Patient Re-evaluated:Patient Re-evaluated prior to induction Oxygen Delivery Method: Circle system utilized Preoxygenation: Pre-oxygenation with 100% oxygen Induction Type: IV induction Ventilation: Mask ventilation without difficulty Laryngoscope Size: Mac and 4 Grade View: Grade I Tube type: Oral Tube size: 7.5 mm Number of attempts: 1 Airway Equipment and Method: Stylet Placement Confirmation: ETT inserted through vocal cords under direct vision,  positive ETCO2 and breath sounds checked- equal and bilateral Secured at: 23 cm Tube secured with: Tape Dental Injury: Teeth and Oropharynx as per pre-operative assessment

## 2018-08-05 ENCOUNTER — Encounter (HOSPITAL_COMMUNITY): Payer: Self-pay | Admitting: Urology

## 2018-08-05 LAB — BASIC METABOLIC PANEL
Anion gap: 11 (ref 5–15)
BUN: 19 mg/dL (ref 8–23)
CO2: 22 mmol/L (ref 22–32)
Calcium: 9 mg/dL (ref 8.9–10.3)
Chloride: 97 mmol/L — ABNORMAL LOW (ref 98–111)
Creatinine, Ser: 1.69 mg/dL — ABNORMAL HIGH (ref 0.61–1.24)
GFR calc Af Amer: 46 mL/min — ABNORMAL LOW (ref >60)
GFR calc non Af Amer: 39 mL/min — ABNORMAL LOW (ref >60)
Glucose, Bld: 151 mg/dL — ABNORMAL HIGH (ref 70–99)
Potassium: 4.4 mmol/L (ref 3.5–5.1)
Sodium: 130 mmol/L — ABNORMAL LOW (ref 135–145)

## 2018-08-05 LAB — HEMOGLOBIN AND HEMATOCRIT, BLOOD
HCT: 37.2 % — ABNORMAL LOW (ref 39.0–52.0)
Hemoglobin: 12.1 g/dL — ABNORMAL LOW (ref 13.0–17.0)

## 2018-08-05 MED ORDER — FLUCONAZOLE 150 MG PO TABS
150.0000 mg | ORAL_TABLET | Freq: Every day | ORAL | Status: AC
  Administered 2018-08-05 – 2018-08-06 (×2): 150 mg via ORAL
  Filled 2018-08-05 (×2): qty 1

## 2018-08-05 MED ORDER — PHENOL 1.4 % MT LIQD
1.0000 | OROMUCOSAL | Status: DC | PRN
  Filled 2018-08-05: qty 177

## 2018-08-05 MED ORDER — MENTHOL 3 MG MT LOZG
1.0000 | LOZENGE | OROMUCOSAL | Status: DC | PRN
  Filled 2018-08-05: qty 9

## 2018-08-05 NOTE — Progress Notes (Signed)
1 Day Post-Op Subjective: Patient reports mild incisional pain from the RLQ incision. No nausea/vomiting. No flatus. Tolerating clears. Hgb and creatinine stable  Objective: Vital signs in last 24 hours: Temp:  [97.6 F (36.4 C)-98.4 F (36.9 C)] 98 F (36.7 C) (07/24 0424) Pulse Rate:  [74-86] 74 (07/24 0424) Resp:  [9-20] 18 (07/24 0424) BP: (110-146)/(59-83) 118/62 (07/24 0424) SpO2:  [94 %-97 %] 94 % (07/24 0424)  Intake/Output from previous day: 07/23 0701 - 07/24 0700 In: 3166.4 [P.O.:600; I.V.:2216.4; IV Piggyback:350] Out: 600 [Urine:500; Blood:100] Intake/Output this shift: No intake/output data recorded.  Physical Exam:  General:alert, cooperative and appears stated age GI: soft, non tender, normal bowel sounds, no palpable masses, no organomegaly, no inguinal hernia Male genitalia: not done Extremities: extremities normal, atraumatic, no cyanosis or edema  Lab Results: Recent Labs    08/03/18 0930 08/04/18 1106 08/05/18 0457  HGB 13.9 13.7 12.1*  HCT 42.7 42.2 37.2*   BMET Recent Labs    08/03/18 0930 08/05/18 0457  NA 137 130*  K 4.3 4.4  CL 104 97*  CO2 24 22  GLUCOSE 91 151*  BUN 22 19  CREATININE 1.76* 1.69*  CALCIUM 10.0 9.0   No results for input(s): LABPT, INR in the last 72 hours. No results for input(s): LABURIN in the last 72 hours. Results for orders placed or performed during the hospital encounter of 08/01/18  SARS Coronavirus 2 (Performed in [facility] hospital lab)     Status: None   Collection Time: 08/01/18  8:53 AM   Specimen: Nasal Swab  Result Value Ref Range Status   SARS Coronavirus 2 NEGATIVE NEGATIVE Final    Comment: (NOTE) SARS-CoV-2 target nucleic acids are NOT DETECTED. The SARS-CoV-2 RNA is generally detectable in upper and lower respiratory specimens during the acute phase of infection. Negative results do not preclude SARS-CoV-2 infection, do not rule out co-infections with other pathogens, and should not be  used as the sole basis for treatment or other patient management decisions. Negative results must be combined with clinical observations, patient history, and epidemiological information. The expected result is Negative. Fact Sheet for Patients: SugarRoll.be Fact Sheet for Healthcare Providers: https://www.woods-mathews.com/ This test is not yet approved or cleared by the [place] FDA and  has been authorized for detection and/or diagnosis of SARS-CoV-2 by FDA under an Emergency Use Authorization (EUA). This EUA will remain  in effect (meaning this test can be used) for the duration of the COVID-19 declaration under Section 56 4(b)(1) of the Act, 21 U.S.C. section 360bbb-3(b)(1), unless the authorization is terminated or revoked sooner. Performed at [facility], [place] [address] [place],  [address]     Studies/Results: No results found.  Assessment/Plan: POD#1 right robotic simple nephrectomy 1. D/c foley 2. Advanced diet as tolerated 3. Ambulate in halls with assistance   LOS: 1 day   Nicolette Bang 08/05/2018, 1:31 PM

## 2018-08-06 MED ORDER — ACETAMINOPHEN 325 MG PO TABS
650.0000 mg | ORAL_TABLET | ORAL | Status: DC | PRN
  Administered 2018-08-06: 650 mg via ORAL
  Filled 2018-08-06: qty 2

## 2018-08-06 MED ORDER — NYSTATIN 100000 UNIT/GM EX OINT
TOPICAL_OINTMENT | Freq: Two times a day (BID) | CUTANEOUS | Status: DC
  Administered 2018-08-06 – 2018-08-07 (×3): via TOPICAL
  Filled 2018-08-06: qty 15

## 2018-08-06 NOTE — Progress Notes (Signed)
2 Days Post-Op  Subjective: He is doing well post op but remains stiff and sore with some burning on the right flank and also across the right anterior upper thigh.  He is also concerned about recurrent balanitis.   He has no other complaints or problems.   ROS:  Review of Systems  All other systems reviewed and are negative.   Anti-infectives: Anti-infectives (From admission, onward)   Start     Dose/Rate Route Frequency Ordered Stop   08/05/18 1400  fluconazole (DIFLUCAN) tablet 150 mg     150 mg Oral Daily 08/05/18 1318 08/07/18 0959   08/04/18 0745  gentamicin (GARAMYCIN) 500 mg in dextrose 5 % 100 mL IVPB     500 mg 112.5 mL/hr over 60 Minutes Intravenous  Once 08/04/18 0737 08/04/18 0852   08/04/18 0538  ceFAZolin (ANCEF) IVPB 2g/100 mL premix  Status:  Discontinued     2 g 200 mL/hr over 30 Minutes Intravenous 30 min pre-op 08/04/18 0539 08/04/18 0747      Current Facility-Administered Medications  Medication Dose Route Frequency Provider Last Rate Last Dose  . atenolol (TENORMIN) tablet 25 mg  25 mg Oral BID Debbrah Alar, PA-C   25 mg at 08/05/18 2205  . bisacodyl (DULCOLAX) EC tablet 5 mg  5 mg Oral Daily PRN Dancy, Amanda, PA-C      . dextrose 5 %-0.45 % sodium chloride infusion   Intravenous Continuous Debbrah Alar, PA-C 75 mL/hr at 08/05/18 0459    . diphenhydrAMINE (BENADRYL) injection 12.5 mg  12.5 mg Intravenous Q6H PRN Debbrah Alar, PA-C   12.5 mg at 08/04/18 1128   Or  . diphenhydrAMINE (BENADRYL) 12.5 MG/5ML elixir 12.5 mg  12.5 mg Oral Q6H PRN Dancy, Amanda, PA-C      . fluconazole (DIFLUCAN) tablet 150 mg  150 mg Oral Daily Cleon Gustin, MD   150 mg at 08/05/18 1332  . FLUoxetine (PROZAC) capsule 10 mg  10 mg Oral BID Debbrah Alar, PA-C   10 mg at 08/05/18 2204  . HYDROcodone-acetaminophen (NORCO/VICODIN) 5-325 MG per tablet 1-2 tablet  1-2 tablet Oral Q4H PRN Debbrah Alar, PA-C   1 tablet at 08/05/18 2230  . HYDROmorphone (DILAUDID) injection 0.5-1  mg  0.5-1 mg Intravenous Q2H PRN Debbrah Alar, PA-C   0.5 mg at 08/05/18 1704  . LORazepam (ATIVAN) tablet 0.5 mg  0.5 mg Oral BID Debbrah Alar, PA-C   0.5 mg at 08/05/18 2205  . menthol-cetylpyridinium (CEPACOL) lozenge 3 mg  1 lozenge Oral PRN McKenzie, Candee Furbish, MD      . neomycin-bacitracin-polymyxin (NEOSPORIN) ointment packet 1 application  1 application Topical TID PRN Dancy, Estill Bamberg, PA-C      . nystatin ointment (MYCOSTATIN)   Topical BID Irine Seal, MD      . ondansetron [facility]) injection 4 mg  4 mg Intravenous Q4H PRN Dancy, Amanda, PA-C      . opium-belladonna (B&O SUPPRETTES) 16.2-60 MG suppository 1 suppository  1 suppository Rectal Q6H PRN Debbrah Alar, PA-C   1 suppository at 08/04/18 1638  . pantoprazole (PROTONIX) EC tablet 40 mg  40 mg Oral Daily Debbrah Alar, PA-C   40 mg at 08/05/18 1020  . phenol (CHLORASEPTIC) mouth spray 1 spray  1 spray Mouth/Throat PRN McKenzie, Candee Furbish, MD         Objective: Vital signs in last 24 hours: Temp:  [97.8 F (36.6 C)-98.2 F (36.8 C)] 97.8 F (36.6 C) (07/25 0444) Pulse Rate:  [66-75] 66 (07/25  7342) Resp:  [16-18] 16 (07/25 0444) BP: (123-137)/(70-77) 135/77 (07/25 0444) SpO2:  [93 %-97 %] 94 % (07/25 0444)  Intake/Output from previous day: 07/24 0701 - 07/25 0700 In: 3343.5 [P.O.:840; I.V.:2503.5] Out: -  Intake/Output this shift: Total I/O In: 2503.5 [I.V.:2503.5] Out: -    Physical Exam Vitals signs reviewed.  Constitutional:      Appearance: Normal appearance.  Cardiovascular:     Rate and Rhythm: Normal rate and regular rhythm.     Heart sounds: Normal heart sounds.  Pulmonary:     Effort: Pulmonary effort is normal. No respiratory distress.     Breath sounds: Normal breath sounds.  Abdominal:     General: Abdomen is flat. Bowel sounds are normal. There is distension (mild).     Palpations: Abdomen is soft.     Comments: Wounds intact without erythema.  Genitourinary:    Comments: Uncircumcised  without phimosis with very minimal erythema of the prepuce and glans.   Meatus is normal.  Musculoskeletal: Normal range of motion.        General: No swelling.  Skin:    General: Skin is warm and dry.  Neurological:     General: No focal deficit present.     Mental Status: He is alert and oriented to person, place, and time.  Psychiatric:        Mood and Affect: Mood normal.        Behavior: Behavior normal.     Lab Results:  Recent Labs    08/03/18 0930 08/04/18 1106 08/05/18 0457  WBC 5.9  --   --   HGB 13.9 13.7 12.1*  HCT 42.7 42.2 37.2*  PLT 212  --   --    BMET Recent Labs    08/03/18 0930 08/05/18 0457  NA 137 130*  K 4.3 4.4  CL 104 97*  CO2 24 22  GLUCOSE 91 151*  BUN 22 19  CREATININE 1.76* 1.69*  CALCIUM 10.0 9.0   PT/INR No results for input(s): LABPROT, INR in the last 72 hours. ABG No results for input(s): PHART, HCO3 in the last 72 hours.  Invalid input(s): PCO2, PO2  Studies/Results: No results found.   Assessment and Plan:  He is doing well s/p right simple nephrectomy but is sore and doesn't feel ready for d/c today.   I will reassess for discharge tomorrow.   He has early balanitis and I will start him on nystatin cream.  He requested fluconazole cream but it is not in the formulary.        LOS: 2 days    Irine Seal 08/06/2018 [phone]

## 2018-08-07 NOTE — Plan of Care (Signed)
  Problem: Education: Goal: Knowledge of General Education information will improve Description: Including pain rating scale, medication(s)/side effects and non-pharmacologic comfort measures Outcome: Progressing   Problem: Health Behavior/Discharge Planning: Goal: Ability to manage health-related needs will improve Outcome: Progressing   Problem: Clinical Measurements: Goal: Ability to maintain clinical measurements within normal limits will improve Outcome: Progressing Goal: Will remain free from infection Outcome: Progressing Goal: Diagnostic test results will improve Outcome: Progressing Goal: Respiratory complications will improve Outcome: Progressing Goal: Cardiovascular complication will be avoided Outcome: Progressing   Problem: Activity: Goal: Risk for activity intolerance will decrease Outcome: Progressing   Problem: Nutrition: Goal: Adequate nutrition will be maintained Outcome: Progressing   Problem: Coping: Goal: Level of anxiety will decrease Outcome: Progressing   Problem: Elimination: Goal: Will not experience complications related to bowel motility Outcome: Progressing Goal: Will not experience complications related to urinary retention Outcome: Progressing   Problem: Pain Managment: Goal: General experience of comfort will improve Outcome: Progressing   Problem: Safety: Goal: Ability to remain free from injury will improve Outcome: Progressing   Problem: Skin Integrity: Goal: Risk for impaired skin integrity will decrease Outcome: Progressing   Problem: Education: Goal: Knowledge of the prescribed therapeutic regimen will improve Outcome: Progressing   Problem: Bowel/Gastric: Goal: Gastrointestinal status for postoperative course will improve Outcome: Progressing   Problem: Clinical Measurements: Goal: Postoperative complications will be avoided or minimized Outcome: Progressing   Problem: Respiratory: Goal: Ability to achieve and  maintain a regular respiratory rate will improve Outcome: Progressing   Problem: Skin Integrity: Goal: Demonstration of wound healing without infection will improve Outcome: Progressing   Problem: Urinary Elimination: Goal: Ability to avoid or minimize complications of infection will improve Outcome: Progressing Goal: Ability to achieve and maintain urine output will improve Outcome: Progressing   

## 2018-08-07 NOTE — Discharge Summary (Signed)
Physician Discharge Summary  Patient ID: David [name] MRN: [id number] DOB/AGE: 04-24-1945 73 y.o.  Admit date: 08/04/2018 Discharge date: 08/07/2018  Admission Diagnoses:  Nonfunctioning kidney  Discharge Diagnoses:  Principal Problem:   Nonfunctioning kidney   Past Medical History:  Diagnosis Date  . Abnormal liver function 03/18/2013  . Adenoma of right adrenal gland 07/11/2002   2.4cm , noted on CT ABD  . Anxiety   . Arthritis of both knees 03/26/2016  . Astigmatism   . Atrial fibrillation ([place])    x1 successful cardioversions  . Back pain   . BCC (basal cell carcinoma of skin)    under right eye and right ear  . Cataract 09/29/2016  . Depression   . Diverticulitis   . Diverticulosis   . Fatty liver   . GERD (gastroesophageal reflux disease)   . Gout   . Hearing loss of both ears 03/26/2016  . History of chronic prostatitis    started at age 64  . History of kidney stones   . Hyperglycemia 03/29/2016  . Hyperlipidemia   . Hypertension   . IBS (irritable bowel syndrome) 03/18/2013  . Incomplete right bundle branch block (RBBB)   . Internal hemorrhoids   . Kidney lesion 06/07/2015   Right midportion, 1.1x1.1 cm hyperechoic, noted on [place] ABD  . LAFB (left anterior fascicular block)   . Left anterior fascicular block 08/06/2017   Noted on EKG  . Lipoma of axilla 09/2016  . Liver lesion, right lobe 06/07/2015   2.5x2.4x2.4 cm hypoechoic lesion posterior aspect, noted on [place] ABD  . Low back pain 03/26/2016  . LVH (left ventricular hypertrophy) 08/06/2017   Moderate, noted on ECHO  . LVH (left ventricular hypertrophy) 08/06/2017   Moderate, Noted on ECHO  . Medicare annual wellness visit, subsequent 03/12/2014  . Muscle cramps   . Nocturia   . OA (osteoarthritis)    Back, Hands, Neck  . Obesity 11/25/2007   Qualifier: Diagnosis of  By: Lenna Gilford MD, Deborra Medina   . Other malaise and fatigue 03/13/2013  . Penis symptom or sign    PENIS RED NO DRAINAGE   . Premature  ventricular complex   . Preventative health care 03/07/2015  . Renal insufficiency   . Scoliosis    Upper thoracic and lumbar  . Sinus bradycardia 08/06/2017   Noted on EKG  . Vitamin D deficiency    resolved    Surgeries: Procedure(s): XI ROBOTIC ASSISTED LAPAROSCOPIC NEPHRECTOMY on 08/04/2018   Consultants (if any):   Discharged Condition: Improved  Hospital Course: David [name] is an 73 y.o. male who was admitted 08/04/2018 with a diagnosis of Nonfunctioning kidney and went to the operating room on 08/04/2018 and underwent the above named procedures.  His postoperative course was unremarkable.  He had some symptoms of balanitis and was given an antifungal cream. He does complain of some burning in the right upper thigh that could be neuropathic.   He has improving surgical pain and minimal nausea and is felt to be ready for discharge.  He was given perioperative antibiotics:  Anti-infectives (From admission, onward)   Start     Dose/Rate Route Frequency Ordered Stop   08/05/18 1400  fluconazole (DIFLUCAN) tablet 150 mg     150 mg Oral Daily 08/05/18 1318 08/06/18 0908   08/04/18 0745  gentamicin (GARAMYCIN) 500 mg in dextrose 5 % 100 mL IVPB     500 mg 112.5 mL/hr over 60 Minutes Intravenous  Once 08/04/18 0737 08/04/18  0177   08/04/18 0538  ceFAZolin (ANCEF) IVPB 2g/100 mL premix  Status:  Discontinued     2 g 200 mL/hr over 30 Minutes Intravenous 30 min pre-op 08/04/18 0539 08/04/18 0747    .  He was given sequential compression devices and early ambulation for DVT prophylaxis.  He benefited maximally from the hospital stay and there were no complications.    Recent vital signs:  Vitals:   08/06/18 2128 08/07/18 0650  BP: (!) 145/76 (!) 142/84  Pulse: 67 63  Resp: 20 20  Temp: 99.1 F (37.3 C) 98.6 F (37 C)  SpO2: 94% 95%   Gen: WD,WN in NAD. CV: RRR without murmur. Lungs: CTA. GI: Soft, mildly distended with +BS.  Wounds intact without erythema. Ext: FROM  without edema.   Recent laboratory studies:  Lab Results  Component Value Date   HGB 12.1 (L) 08/05/2018   HGB 13.7 08/04/2018   HGB 13.9 08/03/2018   Lab Results  Component Value Date   WBC 5.9 08/03/2018   PLT 212 08/03/2018   Lab Results  Component Value Date   INR 1.0 04/20/2018   Lab Results  Component Value Date   NA 130 (L) 08/05/2018   K 4.4 08/05/2018   CL 97 (L) 08/05/2018   CO2 22 08/05/2018   BUN 19 08/05/2018   CREATININE 1.69 (H) 08/05/2018   GLUCOSE 151 (H) 08/05/2018    Discharge Medications:   Allergies as of 08/07/2018      Reactions   Cefaclor Hives   Cephalosporins Hives   Penicillins Rash   Mild maculopapular rash Has patient had a PCN reaction causing immediate rash, facial/tongue/throat swelling, SOB or lightheadedness with hypotension: No Has patient had a PCN reaction causing severe rash involving mucus membranes or skin necrosis: No Has patient had a PCN reaction that required hospitalization: No Has patient had a PCN reaction occurring within the last 10 years: No If all of the above answers are "NO", then may proceed with Cephalosporin use.      Medication List    TAKE these medications   acetaminophen 500 MG tablet Commonly known as: TYLENOL Take 1,000 mg by mouth every 6 (six) hours as needed (pain).   atenolol 25 MG tablet Commonly known as: TENORMIN TAKE ONE TABLET BY MOUTH TWICE A DAY   bisacodyl 5 MG EC tablet Commonly known as: DULCOLAX Take 5 mg by mouth daily as needed for moderate constipation.   carboxymethylcellulose 0.5 % Soln Commonly known as: REFRESH PLUS Place 1 drop into both eyes daily.   Colchicine 0.6 MG Caps 1 tab po bid What changed:   how much to take  how to take this  when to take this  reasons to take this   cyclobenzaprine 5 MG tablet Commonly known as: FLEXERIL Take 1 tablet (5 mg total) by mouth 3 (three) times daily as needed for muscle spasms.   FLUoxetine 10 MG tablet Commonly  known as: PROZAC Take 1 tablet (10 mg total) by mouth 2 (two) times a day.   fluticasone 50 MCG/ACT nasal spray Commonly known as: FLONASE Place 2 sprays into the nose daily as needed for allergies. For congestion   HYDROcodone-acetaminophen 5-325 MG tablet Commonly known as: Norco Take 1-2 tablets by mouth every 6 (six) hours as needed for moderate pain or severe pain.   ICY HOT BACK EX Apply 1 application topically daily as needed (back pain).   ketoconazole 2 % cream Commonly known as: NIZORAL Apply 1  application topically daily. What changed: when to take this   LORazepam 1 MG tablet Commonly known as: ATIVAN Take 1 tablet (1 mg total) by mouth 2 (two) times daily as needed for anxiety or sedation. What changed:   how much to take  when to take this   Lotrisone cream Generic drug: clotrimazole-betamethasone Apply 1 application topically 2 (two) times daily as needed (yeast infection).   omeprazole 20 MG capsule Commonly known as: PRILOSEC Take 20 mg by mouth daily as needed (acid reflux).       Diagnostic Studies: Dg Forearm Left  Result Date: 07/19/2018 CLINICAL DATA:  Pain EXAM: LEFT FOREARM - 2 VIEW COMPARISON:  None. FINDINGS: There is no evidence of fracture or other focal bone lesions. Soft tissues are unremarkable. IMPRESSION: Negative. Electronically Signed   By: Constance Holster M.D.   On: 07/19/2018 20:17    Disposition: Discharge disposition: 01-Home or Self Care       Discharge Instructions    Discontinue IV   Complete by: As directed       Follow-up Information    Karen Kays, NP On 08/11/2018.   Specialty: Nurse Practitioner Why: at 8:00 Contact information: [address] [place] [address] [phone]            Signed: Irine Seal 08/07/2018, 7:58 AM

## 2018-08-08 ENCOUNTER — Encounter: Payer: Self-pay | Admitting: Family Medicine

## 2018-08-08 ENCOUNTER — Ambulatory Visit (INDEPENDENT_AMBULATORY_CARE_PROVIDER_SITE_OTHER): Payer: Medicare Other | Admitting: Family Medicine

## 2018-08-08 ENCOUNTER — Other Ambulatory Visit: Payer: Self-pay

## 2018-08-08 VITALS — BP 142/80 | HR 60 | Temp 98.0°F | Resp 18 | Wt 212.2 lb

## 2018-08-08 DIAGNOSIS — N289 Disorder of kidney and ureter, unspecified: Secondary | ICD-10-CM | POA: Diagnosis not present

## 2018-08-08 DIAGNOSIS — Z0000 Encounter for general adult medical examination without abnormal findings: Secondary | ICD-10-CM | POA: Diagnosis not present

## 2018-08-08 DIAGNOSIS — M1A9XX0 Chronic gout, unspecified, without tophus (tophi): Secondary | ICD-10-CM | POA: Diagnosis not present

## 2018-08-08 DIAGNOSIS — I10 Essential (primary) hypertension: Secondary | ICD-10-CM | POA: Diagnosis not present

## 2018-08-08 DIAGNOSIS — R739 Hyperglycemia, unspecified: Secondary | ICD-10-CM

## 2018-08-08 DIAGNOSIS — I4891 Unspecified atrial fibrillation: Secondary | ICD-10-CM

## 2018-08-08 DIAGNOSIS — N1330 Unspecified hydronephrosis: Secondary | ICD-10-CM | POA: Diagnosis not present

## 2018-08-08 DIAGNOSIS — E782 Mixed hyperlipidemia: Secondary | ICD-10-CM

## 2018-08-08 DIAGNOSIS — R945 Abnormal results of liver function studies: Secondary | ICD-10-CM

## 2018-08-08 DIAGNOSIS — F418 Other specified anxiety disorders: Secondary | ICD-10-CM

## 2018-08-08 NOTE — Patient Instructions (Signed)
Call late august to schedule flu shot Preventive Care 73 Years and Older, Male Preventive care refers to lifestyle choices and visits with your health care provider that can promote health and wellness. This includes:  A yearly physical exam. This is also called an annual well check.  Regular dental and eye exams.  Immunizations.  Screening for certain conditions.  Healthy lifestyle choices, such as diet and exercise. What can I expect for my preventive care visit? Physical exam Your health care provider will check:  Height and weight. These may be used to calculate body mass index (BMI), which is a measurement that tells if you are at a healthy weight.  Heart rate and blood pressure.  Your skin for abnormal spots. Counseling Your health care provider may ask you questions about:  Alcohol, tobacco, and drug use.  Emotional well-being.  Home and relationship well-being.  Sexual activity.  Eating habits.  History of falls.  Memory and ability to understand (cognition).  Work and work Statistician. What immunizations do I need?  Influenza (flu) vaccine  This is recommended every year. Tetanus, diphtheria, and pertussis (Tdap) vaccine  You may need a Td booster every 10 years. Varicella (chickenpox) vaccine  You may need this vaccine if you have not already been vaccinated. Zoster (shingles) vaccine  You may need this after age 85. Pneumococcal conjugate (PCV13) vaccine  One dose is recommended after age 24. Pneumococcal polysaccharide (PPSV23) vaccine  One dose is recommended after age 102. Measles, mumps, and rubella (MMR) vaccine  You may need at least one dose of MMR if you were born in 1957 or later. You may also need a second dose. Meningococcal conjugate (MenACWY) vaccine  You may need this if you have certain conditions. Hepatitis A vaccine  You may need this if you have certain conditions or if you travel or work in places where you may be  exposed to hepatitis A. Hepatitis B vaccine  You may need this if you have certain conditions or if you travel or work in places where you may be exposed to hepatitis B. Haemophilus influenzae type b (Hib) vaccine  You may need this if you have certain conditions. You may receive vaccines as individual doses or as more than one vaccine together in one shot (combination vaccines). Talk with your health care provider about the risks and benefits of combination vaccines. What tests do I need? Blood tests  Lipid and cholesterol levels. These may be checked every 5 years, or more frequently depending on your overall health.  Hepatitis C test.  Hepatitis B test. Screening  Lung cancer screening. You may have this screening every year starting at age 11 if you have a 30-pack-year history of smoking and currently smoke or have quit within the past 15 years.  Colorectal cancer screening. All adults should have this screening starting at age 44 and continuing until age 35. Your health care provider may recommend screening at age 25 if you are at increased risk. You will have tests every 1-10 years, depending on your results and the type of screening test.  Prostate cancer screening. Recommendations will vary depending on your family history and other risks.  Diabetes screening. This is done by checking your blood sugar (glucose) after you have not eaten for a while (fasting). You may have this done every 1-3 years.  Abdominal aortic aneurysm (AAA) screening. You may need this if you are a current or former smoker.  Sexually transmitted disease (STD) testing. Follow these instructions  at home: Eating and drinking  Eat a diet that includes fresh fruits and vegetables, whole grains, lean protein, and low-fat dairy products. Limit your intake of foods with high amounts of sugar, saturated fats, and salt.  Take vitamin and mineral supplements as recommended by your health care provider.  Do not  drink alcohol if your health care provider tells you not to drink.  If you drink alcohol: ? Limit how much you have to 0-2 drinks a day. ? Be aware of how much alcohol is in your drink. In the [place] one drink equals one 12 oz bottle of beer (355 mL), one 5 oz glass of wine (148 mL), or one 1 oz glass of hard liquor (44 mL). Lifestyle  Take daily care of your teeth and gums.  Stay active. Exercise for at least 30 minutes on 5 or more days each week.  Do not use any products that contain nicotine or tobacco, such as cigarettes, e-cigarettes, and chewing tobacco. If you need help quitting, ask your health care provider.  If you are sexually active, practice safe sex. Use a condom or other form of protection to prevent STIs (sexually transmitted infections).  Talk with your health care provider about taking a low-dose aspirin or statin. What's next?  Visit your health care provider once a year for a well check visit.  Ask your health care provider how often you should have your eyes and teeth checked.  Stay up to date on all vaccines. This information is not intended to replace advice given to you by your health care provider. Make sure you discuss any questions you have with your health care provider. Document Released: 01/25/2015 Document Revised: 12/23/2017 Document Reviewed: 12/23/2017 Elsevier Patient Education  2020 [employer].

## 2018-08-08 NOTE — Assessment & Plan Note (Signed)
Check cmp and vitamin D

## 2018-08-09 LAB — CBC
HCT: 38.9 % — ABNORMAL LOW (ref 39.0–52.0)
Hemoglobin: 13.2 g/dL (ref 13.0–17.0)
MCHC: 33.9 g/dL (ref 30.0–36.0)
MCV: 91 fl (ref 78.0–100.0)
Platelets: 275 10*3/uL (ref 150.0–400.0)
RBC: 4.28 Mil/uL (ref 4.22–5.81)
RDW: 14.4 % (ref 11.5–15.5)
WBC: 8.1 10*3/uL (ref 4.0–10.5)

## 2018-08-09 LAB — LIPID PANEL
Cholesterol: 179 mg/dL (ref 0–200)
HDL: 40.9 mg/dL (ref >39.00)
LDL Cholesterol: 120 mg/dL — ABNORMAL HIGH (ref 0–99)
NonHDL: 138.18
Total CHOL/HDL Ratio: 4
Triglycerides: 89 mg/dL (ref 0.0–149.0)
VLDL: 17.8 mg/dL (ref 0.0–40.0)

## 2018-08-09 LAB — VITAMIN D 25 HYDROXY (VIT D DEFICIENCY, FRACTURES): VITD: 35.38 ng/mL (ref 30.00–100.00)

## 2018-08-09 LAB — COMPREHENSIVE METABOLIC PANEL
ALT: 15 U/L (ref 0–53)
AST: 18 U/L (ref 0–37)
Albumin: 3.8 g/dL (ref 3.5–5.2)
Alkaline Phosphatase: 80 U/L (ref 39–117)
BUN: 15 mg/dL (ref 6–23)
CO2: 28 mEq/L (ref 19–32)
Calcium: 10.1 mg/dL (ref 8.4–10.5)
Chloride: 99 mEq/L (ref 96–112)
Creatinine, Ser: 1.75 mg/dL — ABNORMAL HIGH (ref 0.40–1.50)
GFR: 38.37 mL/min — ABNORMAL LOW (ref >60.00)
Glucose, Bld: 85 mg/dL (ref 70–99)
Potassium: 4.2 mEq/L (ref 3.5–5.1)
Sodium: 135 mEq/L (ref 135–145)
Total Bilirubin: 0.9 mg/dL (ref 0.2–1.2)
Total Protein: 6.5 g/dL (ref 6.0–8.3)

## 2018-08-09 LAB — TSH: TSH: 1.65 u[IU]/mL (ref 0.35–4.50)

## 2018-08-09 LAB — HEMOGLOBIN A1C: Hgb A1c MFr Bld: 5.5 % (ref 4.6–6.5)

## 2018-08-09 LAB — URIC ACID: Uric Acid, Serum: 6.8 mg/dL (ref 4.0–7.8)

## 2018-08-09 LAB — SEDIMENTATION RATE: Sed Rate: 15 mm/hr (ref 0–20)

## 2018-08-10 NOTE — Assessment & Plan Note (Signed)
Continue to monitor anf hydrate

## 2018-08-10 NOTE — Assessment & Plan Note (Signed)
Doing fairly well given everything he has gone through recently

## 2018-08-10 NOTE — Progress Notes (Signed)
Subjective:    Patient ID: David [name], male    DOB: 08-24-1945, 73 y.o.   MRN: [id number]  No chief complaint on file.   HPI Patient is in today for annual preventative exam and follow up on chronic medical concerns. He is feeing well today after recently undergoing a right sided nephrectomy due to stones and hydronephrosis. No recent febrile illness. The pain in his right leg has improved. His appetite is still suppressed but he is trying to hydrate til he feels like eating. Has not had a a bowel movement since his surgery but he is passing gas, he notes a mild sore throat due to being intubated. It is improving. Denies CP/palp/SOB/HA/congestion/fevers/GI or GU c/o. Taking meds as prescribed he has not been able to exercise recently  Past Medical History:  Diagnosis Date  . Abnormal liver function 03/18/2013  . Adenoma of right adrenal gland 07/11/2002   2.4cm , noted on CT ABD  . Anxiety   . Arthritis of both knees 03/26/2016  . Astigmatism   . Atrial fibrillation ([place])    x1 successful cardioversions  . Back pain   . BCC (basal cell carcinoma of skin)    under right eye and right ear  . Cataract 09/29/2016  . Depression   . Diverticulitis   . Diverticulosis   . Fatty liver   . GERD (gastroesophageal reflux disease)   . Gout   . Hearing loss of both ears 03/26/2016  . History of chronic prostatitis    started at age 55  . History of kidney stones   . Hyperglycemia 03/29/2016  . Hyperlipidemia   . Hypertension   . IBS (irritable bowel syndrome) 03/18/2013  . Incomplete right bundle branch block (RBBB)   . Internal hemorrhoids   . Kidney lesion 06/07/2015   Right midportion, 1.1x1.1 cm hyperechoic, noted on [place] ABD  . LAFB (left anterior fascicular block)   . Left anterior fascicular block 08/06/2017   Noted on EKG  . Lipoma of axilla 09/2016  . Liver lesion, right lobe 06/07/2015   2.5x2.4x2.4 cm hypoechoic lesion posterior aspect, noted on [place] ABD  . Low back pain  03/26/2016  . LVH (left ventricular hypertrophy) 08/06/2017   Moderate, noted on ECHO  . LVH (left ventricular hypertrophy) 08/06/2017   Moderate, Noted on ECHO  . Medicare annual wellness visit, subsequent 03/12/2014  . Muscle cramps   . Nocturia   . OA (osteoarthritis)    Back, Hands, Neck  . Obesity 11/25/2007   Qualifier: Diagnosis of  By: Lenna Gilford MD, Deborra Medina   . Other malaise and fatigue 03/13/2013  . Penis symptom or sign    PENIS RED NO DRAINAGE   . Premature ventricular complex   . Preventative health care 03/07/2015  . Renal insufficiency   . Scoliosis    Upper thoracic and lumbar  . Sinus bradycardia 08/06/2017   Noted on EKG  . Vitamin D deficiency    resolved    Past Surgical History:  Procedure Laterality Date  . CARDIOVERSION  07/31/2017  . CATARACT EXTRACTION, BILATERAL    . COLON SURGERY  2009   segmental sigmoid resection  . COLONOSCOPY    . CYSTOSCOPY W/ RETROGRADES Right 06/07/2018   Procedure: CYSTOSCOPY WITH RETROGRADE PYELOGRAM, uphrostogram;  Surgeon: Cleon Gustin, MD;  Location: [facility];  Service: Urology;  Laterality: Right;  . CYSTOSCOPY WITH URETEROSCOPY AND STENT PLACEMENT Right 02/28/2018   Procedure: CYSTOSCOPY WITH URETEROSCOPY [place];  Surgeon: Kathie Rhodes,  MD;  Location: [place];  Service: Urology;  Laterality: Right;  . CYSTOSCOPY/URETEROSCOPY/HOLMIUM LASER/STENT PLACEMENT Right 11/29/2017   Procedure: CYSTOSCOPY/RETROGRADE/URETEROSCOPY/HOLMIUM LASER/STENT PLACEMENT;  Surgeon: Kathie Rhodes, MD;  Location: [facility];  Service: Urology;  Laterality: Right;  . EYE SURGERY Bilateral 01/12/2017   cataract removal  . HOLMIUM LASER APPLICATION Right 06/25/4313   Procedure: HOLMIUM LASER APPLICATION;  Surgeon: Kathie Rhodes, MD;  Location: [facility];  Service: Urology;  Laterality: Right;  . IR BALLOON DILATION URETERAL STRICTURE RIGHT  03/14/2018  . IR NEPHRO TUBE REMOV/FL  03/17/2018  . IR NEPHROSTOGRAM RIGHT  THRU EXISTING ACCESS  03/17/2018  . IR NEPHROSTOMY EXCHANGE RIGHT  03/14/2018  . IR NEPHROSTOMY EXCHANGE RIGHT  06/21/2018  . IR NEPHROSTOMY PLACEMENT RIGHT  03/02/2018  . IR NEPHROSTOMY PLACEMENT RIGHT  04/20/2018  . IR URETERAL STENT PLACEMENT EXISTING ACCESS RIGHT  03/14/2018  . NOSE SURGERY     Submucous resection age 46  . NUCLEAR STRESS TEST  06/03/2009  . ROBOT ASSISTED LAPAROSCOPIC NEPHRECTOMY Right 08/04/2018   Procedure: XI ROBOTIC ASSISTED LAPAROSCOPIC NEPHRECTOMY;  Surgeon: Cleon Gustin, MD;  Location: [facility];  Service: Urology;  Laterality: Right;  3 HRS  . ROBOT ASSISTED PYELOPLASTY Right 06/07/2018   Procedure: attempted XI ROBOTIC ASSISTED PYELOPLASTY, lysis of adhesions;  Surgeon: Cleon Gustin, MD;  Location: [facility];  Service: Urology;  Laterality: Right;  3 HRS  . SKIN BIOPSY    . URETEROSCOPY WITH HOLMIUM LASER LITHOTRIPSY Right 09/24/2017   Procedure: CYSTOSCOPY, URETEROSCOPY WITH HOLMIUM LASER LITHOTRIPSY, STENT PLACEMENT;  Surgeon: Kathie Rhodes, MD;  Location: [facility];  Service: Urology;  Laterality: Right;    Family History  Problem Relation Age of Onset  . Heart disease Mother   . Hypertension Mother   . Stroke Mother   . Colon cancer Mother   . Breast cancer Mother   . Heart disease Father        pacemaker  . Aortic aneurysm Father   . Hypertension Father   . Heart disease Sister   . Atrial fibrillation Sister   . Obesity Sister   . Sleep apnea Sister   . Heart attack Brother   . Other Brother        muscle disease  . Arthritis Brother   . Stroke Brother   . Atrial fibrillation Brother   . Cancer Maternal Grandmother        ?  Marland Kitchen Heart attack Maternal Grandmother   . Diabetes Maternal Grandmother   . Cancer Maternal Grandfather        hodgin's lymphoma  . Heart attack Paternal Grandmother   . Anxiety disorder Paternal Grandmother   . Pneumonia Paternal Grandfather   . Heart attack Brother   . Atrial fibrillation Brother   .  Atrial fibrillation Brother   . Heart attack Brother   . Hypertension Brother   . Hyperlipidemia Brother   . Heart attack Brother   . Other Brother        heart valve operation  . Atrial fibrillation Brother     Social History   Socioeconomic History  . Marital status: Married    Spouse name: Not on file  . Number of children: 3  . Years of education: Not on file  . Highest education level: Not on file  Occupational History  . Occupation: Nurse, adult: [employer]  . Financial resource strain: Not on file  . Food insecurity  Worry: Not on file    Inability: Not on file  . Transportation needs    Medical: Not on file    Non-medical: Not on file  Tobacco Use  . Smoking status: Former Smoker    Packs/day: 1.00    Years: 6.00    Pack years: 6.00    Types: Cigarettes    Start date: 01/12/1969  . Smokeless tobacco: Never Used  . Tobacco comment: [id number]  Substance and Sexual Activity  . Alcohol use: Not Currently    Comment: NONE SINCE July 18 2017  . Drug use: No  . Sexual activity: Yes  Lifestyle  . Physical activity    Days per week: Not on file    Minutes per session: Not on file  . Stress: Not on file  Relationships  . Social Herbalist on phone: Not on file    Gets together: Not on file    Attends religious service: Not on file    Active member of club or organization: Not on file    Attends meetings of clubs or organizations: Not on file    Relationship status: Not on file  . Intimate partner violence    Fear of current or ex partner: Not on file    Emotionally abused: Not on file    Physically abused: Not on file    Forced sexual activity: Not on file  Other Topics Concern  . Not on file  Social History Narrative   The patient is married for the second time, he has 3 sons.   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   2 alcoholic beverages most days.   1 caffeinated beverage daily   No drug use no current tobacco use he is a  prior smoker   12/24/2016    Outpatient Medications Prior to Visit  Medication Sig Dispense Refill  . acetaminophen (TYLENOL) 500 MG tablet Take 1,000 mg by mouth every 6 (six) hours as needed (pain).     Marland Kitchen atenolol (TENORMIN) 25 MG tablet TAKE ONE TABLET BY MOUTH TWICE A DAY (Patient taking differently: Take 25 mg by mouth 2 (two) times daily. ) 180 tablet 2  . bisacodyl (DULCOLAX) 5 MG EC tablet Take 5 mg by mouth daily as needed for moderate constipation.    . carboxymethylcellulose (REFRESH PLUS) 0.5 % SOLN Place 1 drop into both eyes daily.    . clotrimazole-betamethasone (LOTRISONE) cream Apply 1 application topically 2 (two) times daily as needed (yeast infection).  30 g 0  . Colchicine 0.6 MG CAPS 1 tab po bid (Patient taking differently: Take 0.6 mg by mouth 2 (two) times daily as needed (gout). 1 tab po bid) 60 capsule 0  . cyclobenzaprine (FLEXERIL) 5 MG tablet Take 1 tablet (5 mg total) by mouth 3 (three) times daily as needed for muscle spasms. 30 tablet 2  . FLUoxetine (PROZAC) 10 MG tablet Take 1 tablet (10 mg total) by mouth 2 (two) times a day. 60 tablet 3  . fluticasone (FLONASE) 50 MCG/ACT nasal spray Place 2 sprays into the nose daily as needed for allergies. For congestion    . HYDROcodone-acetaminophen (NORCO) 5-325 MG tablet Take 1-2 tablets by mouth every 6 (six) hours as needed for moderate pain or severe pain. 20 tablet 0  . ketoconazole (NIZORAL) 2 % cream Apply 1 application topically daily. (Patient taking differently: Apply 1 application topically once a week. ) 60 g 1  . LORazepam (ATIVAN) 1 MG tablet Take  1 tablet (1 mg total) by mouth 2 (two) times daily as needed for anxiety or sedation. (Patient taking differently: Take 0.5 mg by mouth 2 (two) times daily. ) 180 tablet 1  . Menthol, Topical Analgesic, (ICY HOT BACK EX) Apply 1 application topically daily as needed (back pain).    Marland Kitchen omeprazole (PRILOSEC) 20 MG capsule Take 20 mg by mouth daily as needed (acid  reflux).      No facility-administered medications prior to visit.     Allergies  Allergen Reactions  . Cefaclor Hives  . Cephalosporins Hives  . Penicillins Rash    Mild maculopapular rash Has patient had a PCN reaction causing immediate rash, facial/tongue/throat swelling, SOB or lightheadedness with hypotension: No Has patient had a PCN reaction causing severe rash involving mucus membranes or skin necrosis: No Has patient had a PCN reaction that required hospitalization: No Has patient had a PCN reaction occurring within the last 10 years: No If all of the above answers are "NO", then may proceed with Cephalosporin use.     Review of Systems  Constitutional: Positive for malaise/fatigue. Negative for fever.  HENT: Negative for congestion.   Eyes: Negative for blurred vision.  Respiratory: Negative for shortness of breath.   Cardiovascular: Negative for chest pain, palpitations and leg swelling.  Gastrointestinal: Positive for abdominal pain. Negative for blood in stool and nausea.  Genitourinary: Negative for dysuria and frequency.  Musculoskeletal: Negative for falls.  Skin: Negative for rash.  Neurological: Negative for dizziness, loss of consciousness and headaches.  Endo/Heme/Allergies: Negative for environmental allergies.  Psychiatric/Behavioral: Negative for depression. The patient is not nervous/anxious.        Objective:    Physical Exam Vitals signs and nursing note reviewed.  Constitutional:      General: He is not in acute distress.    Appearance: He is well-developed.  HENT:     Head: Normocephalic and atraumatic.     Nose: Nose normal.  Eyes:     General:        Right eye: No discharge.        Left eye: No discharge.  Neck:     Musculoskeletal: Normal range of motion and neck supple.  Cardiovascular:     Rate and Rhythm: Normal rate and regular rhythm.     Heart sounds: No murmur.  Pulmonary:     Effort: Pulmonary effort is normal.     Breath  sounds: Normal breath sounds.  Abdominal:     General: Bowel sounds are normal.     Palpations: Abdomen is soft. There is no mass.     Tenderness: There is abdominal tenderness.     Hernia: No hernia is present.     Comments: Multiple healing incision sites with ecchymosis surrounding but no fluctance, redness or drainage.   Skin:    General: Skin is warm and dry.  Neurological:     Mental Status: He is alert and oriented to person, place, and time.     BP (!) 142/80 (BP Location: Left Arm, Patient Position: Sitting, Cuff Size: Normal)   Pulse 60   Temp 98 F (36.7 C) (Oral)   Resp 18   Wt 212 lb 3.2 oz (96.3 kg)   SpO2 97%   BMI 28.78 kg/m  Wt Readings from Last 3 Encounters:  08/08/18 212 lb 3.2 oz (96.3 kg)  08/04/18 215 lb (97.5 kg)  08/03/18 215 lb (97.5 kg)    Diabetic Foot Exam - Simple   No  data filed     Lab Results  Component Value Date   WBC 8.1 08/08/2018   HGB 13.2 08/08/2018   HCT 38.9 (L) 08/08/2018   PLT 275.0 08/08/2018   GLUCOSE 85 08/08/2018   CHOL 179 08/08/2018   TRIG 89.0 08/08/2018   HDL 40.90 08/08/2018   LDLDIRECT 168.5 03/29/2006   LDLCALC 120 (H) 08/08/2018   ALT 15 08/08/2018   AST 18 08/08/2018   NA 135 08/08/2018   K 4.2 08/08/2018   CL 99 08/08/2018   CREATININE 1.75 (H) 08/08/2018   BUN 15 08/08/2018   CO2 28 08/08/2018   TSH 1.65 08/08/2018   PSA 4.4 (H) 07/01/2018   INR 1.0 04/20/2018   HGBA1C 5.5 08/08/2018    Lab Results  Component Value Date   TSH 1.65 08/08/2018   Lab Results  Component Value Date   WBC 8.1 08/08/2018   HGB 13.2 08/08/2018   HCT 38.9 (L) 08/08/2018   MCV 91.0 08/08/2018   PLT 275.0 08/08/2018   Lab Results  Component Value Date   NA 135 08/08/2018   K 4.2 08/08/2018   CO2 28 08/08/2018   GLUCOSE 85 08/08/2018   BUN 15 08/08/2018   CREATININE 1.75 (H) 08/08/2018   BILITOT 0.9 08/08/2018   ALKPHOS 80 08/08/2018   AST 18 08/08/2018   ALT 15 08/08/2018   PROT 6.5 08/08/2018   ALBUMIN  3.8 08/08/2018   CALCIUM 10.1 08/08/2018   ANIONGAP 11 08/05/2018   GFR 38.37 (L) 08/08/2018   Lab Results  Component Value Date   CHOL 179 08/08/2018   Lab Results  Component Value Date   HDL 40.90 08/08/2018   Lab Results  Component Value Date   LDLCALC 120 (H) 08/08/2018   Lab Results  Component Value Date   TRIG 89.0 08/08/2018   Lab Results  Component Value Date   CHOLHDL 4 08/08/2018   Lab Results  Component Value Date   HGBA1C 5.5 08/08/2018       Assessment & Plan:   Problem List Items Addressed This Visit    Hyperlipidemia, mixed    Encouraged heart healthy diet, increase exercise, avoid trans fats, consider a krill oil cap daily      Essential hypertension, benign - Primary    Well controlled, no changes to meds. Encouraged heart healthy diet such as the DASH diet and exercise as tolerated.       Relevant Orders   CBC (Completed)   Comprehensive metabolic panel (Completed)   TSH (Completed)   Lipid panel (Completed)   Atrial fibrillation (H[facility])    Rate controlled       Gout   Relevant Orders   Uric acid (Completed)   Renal insufficiency   Relevant Orders   Lipid panel (Completed)   Sedimentation rate (Completed)   Abnormal liver function    Continue to monitor anf hydrate      Depression with anxiety    Doing fairly well given everything he has gone through recently      Preventative health care    Patient encouraged to maintain heart healthy diet, regular exercise, adequate sleep. Consider daily probiotics. Take medications as prescribed. Labs reviewed      Hyperglycemia    hgba1c acceptable, minimize simple carbs. Increase exercise as tolerated. Continue current meds      Relevant Orders   Hemoglobin A1c (Completed)   Hydronephrosis    S/p right nephrectomy, doing well      Hypercalcemia  Check cmp and vitamin D      Relevant Orders   VITAMIN D 25 Hydroxy (Vit-D Deficiency, Fractures) (Completed)      I am having  David [name] "David [name]" maintain his fluticasone, atenolol, carboxymethylcellulose, (Menthol, Topical Analgesic, (ICY HOT BACK EX)), omeprazole, acetaminophen, bisacodyl, LORazepam, Lotrisone, cyclobenzaprine, Colchicine, FLUoxetine, ketoconazole, and HYDROcodone-acetaminophen.  No orders of the defined types were placed in this encounter.    Penni Homans, MD

## 2018-08-10 NOTE — Assessment & Plan Note (Signed)
Patient encouraged to maintain heart healthy diet, regular exercise, adequate sleep. Consider daily probiotics. Take medications as prescribed. Labs reviewed 

## 2018-08-10 NOTE — Assessment & Plan Note (Signed)
Encouraged heart healthy diet, increase exercise, avoid trans fats, consider a krill oil cap daily 

## 2018-08-10 NOTE — Assessment & Plan Note (Signed)
S/p right nephrectomy, doing well

## 2018-08-10 NOTE — Assessment & Plan Note (Signed)
Rate controlled 

## 2018-08-10 NOTE — Assessment & Plan Note (Signed)
Well controlled, no changes to meds. Encouraged heart healthy diet such as the DASH diet and exercise as tolerated.  °

## 2018-08-10 NOTE — Assessment & Plan Note (Signed)
hgba1c acceptable, minimize simple carbs. Increase exercise as tolerated. Continue current meds 

## 2018-08-15 ENCOUNTER — Ambulatory Visit: Payer: Medicare Other

## 2018-08-16 ENCOUNTER — Other Ambulatory Visit (HOSPITAL_COMMUNITY): Payer: Medicare Other

## 2018-08-17 ENCOUNTER — Other Ambulatory Visit: Payer: Self-pay

## 2018-08-17 ENCOUNTER — Ambulatory Visit: Payer: Self-pay | Admitting: Family Medicine

## 2018-08-17 ENCOUNTER — Encounter: Payer: Self-pay | Admitting: Family

## 2018-08-17 ENCOUNTER — Ambulatory Visit (INDEPENDENT_AMBULATORY_CARE_PROVIDER_SITE_OTHER): Payer: Medicare Other | Admitting: Family

## 2018-08-17 VITALS — BP 127/75 | HR 69 | Temp 98.4°F | Ht 72.0 in | Wt 205.0 lb

## 2018-08-17 DIAGNOSIS — J302 Other seasonal allergic rhinitis: Secondary | ICD-10-CM

## 2018-08-17 NOTE — Telephone Encounter (Signed)
Pt. Reports he had surgery 08/04/18 and has had a sore throat since then. Thought it could have been from being intubated. Sore throat is worse today. No fever or difficulty swallowing. Warm transfer to [facility] in the practice. Answer Assessment - Initial Assessment Questions 1. ONSET: "When did the throat start hurting?" (Hours or days ago)      08/04/18 after surgery 2. SEVERITY: "How bad is the sore throat?" (Scale 1-10; mild, moderate or severe)   - MILD (1-3):  doesn't interfere with eating or normal activities   - MODERATE (4-7): interferes with eating some solids and normal activities   - SEVERE (8-10):  excruciating pain, interferes with most normal activities   - SEVERE DYSPHAGIA: can't swallow liquids, drooling     Mild 3. STREP EXPOSURE: "Has there been any exposure to strep within the past week?" If so, ask: "What type of contact occurred?"      No 4.  VIRAL SYMPTOMS: "Are there any symptoms of a cold, such as a runny nose, cough, hoarse voice or red eyes?"      No 5. FEVER: "Do you have a fever?" If so, ask: "What is your temperature, how was it measured, and when did it start?"     No 6. PUS ON THE TONSILS: "Is there pus on the tonsils in the back of your throat?"     No 7. OTHER SYMPTOMS: "Do you have any other symptoms?" (e.g., difficulty breathing, headache, rash)     No 8. PREGNANCY: "Is there any chance you are pregnant?" "When was your last menstrual period?"     n/a  Protocols used: SORE THROAT-A-AH

## 2018-08-17 NOTE — Progress Notes (Signed)
Virtual Visit via Video Note  I connected with David [name] on 08/17/18 at  9:40 AM EDT by a video enabled telemedicine application and verified that I am speaking with the correct person using two identifiers.  Location: Patient: home Provider: home   I discussed the limitations of evaluation and management by telemedicine and the availability of in person appointments. The patient expressed understanding and agreed to proceed.  History of Present Illness:  Patient is a 73 yr old male who presents today with chief complaint of "scratchy throat."   Patient reports that he had kidney surgery 10-11 days ago. Was intubated. Notes that his throat has been sensitive and tender since that time.  Temp 98.4.  Reports that his bp has been 115/75, oxygen 94-95%, afebrile.    He reports that his throat felt a bit more scratchy than usual when he woke this am but is now resolved.  Notes some mild post nasal drip. He uses flonase prn, not using currently.    Past Medical History:  Diagnosis Date  . Abnormal liver function 03/18/2013  . Adenoma of right adrenal gland 07/11/2002   2.4cm , noted on CT ABD  . Anxiety   . Arthritis of both knees 03/26/2016  . Astigmatism   . Atrial fibrillation ([place])    x1 successful cardioversions  . Back pain   . BCC (basal cell carcinoma of skin)    under right eye and right ear  . Cataract 09/29/2016  . Depression   . Diverticulitis   . Diverticulosis   . Fatty liver   . GERD (gastroesophageal reflux disease)   . Gout   . Hearing loss of both ears 03/26/2016  . History of chronic prostatitis    started at age 63  . History of kidney stones   . Hyperglycemia 03/29/2016  . Hyperlipidemia   . Hypertension   . IBS (irritable bowel syndrome) 03/18/2013  . Incomplete right bundle branch block (RBBB)   . Internal hemorrhoids   . Kidney lesion 06/07/2015   Right midportion, 1.1x1.1 cm hyperechoic, noted on [place] ABD  . LAFB (left anterior fascicular block)   .  Left anterior fascicular block 08/06/2017   Noted on EKG  . Lipoma of axilla 09/2016  . Liver lesion, right lobe 06/07/2015   2.5x2.4x2.4 cm hypoechoic lesion posterior aspect, noted on [place] ABD  . Low back pain 03/26/2016  . LVH (left ventricular hypertrophy) 08/06/2017   Moderate, noted on ECHO  . LVH (left ventricular hypertrophy) 08/06/2017   Moderate, Noted on ECHO  . Medicare annual wellness visit, subsequent 03/12/2014  . Muscle cramps   . Nocturia   . OA (osteoarthritis)    Back, Hands, Neck  . Obesity 11/25/2007   Qualifier: Diagnosis of  By: Lenna Gilford MD, Deborra Medina   . Other malaise and fatigue 03/13/2013  . Penis symptom or sign    PENIS RED NO DRAINAGE   . Premature ventricular complex   . Preventative health care 03/07/2015  . Renal insufficiency   . Scoliosis    Upper thoracic and lumbar  . Sinus bradycardia 08/06/2017   Noted on EKG  . Vitamin D deficiency    resolved     Social History   Socioeconomic History  . Marital status: Married    Spouse name: Not on file  . Number of children: 3  . Years of education: Not on file  . Highest education level: Not on file  Occupational History  . 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[employer]  Employer: [employer]  Social Needs  . Financial resource strain: Not on file  . Food insecurity    Worry: Not on file    Inability: Not on file  . Transportation needs    Medical: Not on file    Non-medical: Not on file  Tobacco Use  . Smoking status: Former Smoker    Packs/day: 1.00    Years: 6.00    Pack years: 6.00    Types: Cigarettes    Start date: 01/12/1969  . Smokeless tobacco: Never Used  . Tobacco comment: [id number]  Substance and Sexual Activity  . Alcohol use: Not Currently    Comment: NONE SINCE July 18 2017  . Drug use: No  . Sexual activity: Yes  Lifestyle  . Physical activity    Days per week: Not on file    Minutes per session: Not on file  . Stress: Not on file  Relationships  . Social Herbalist on phone: Not on file     Gets together: Not on file    Attends religious service: Not on file    Active member of club or organization: Not on file    Attends meetings of clubs or organizations: Not on file    Relationship status: Not on file  . Intimate partner violence    Fear of current or ex partner: Not on file    Emotionally abused: Not on file    Physically abused: Not on file    Forced sexual activity: Not on file  Other Topics Concern  . Not on file  Social History Narrative   The patient is married for the second time, he has 3 sons.   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   2 alcoholic beverages most days.   1 caffeinated beverage daily   No drug use no current tobacco use he is a prior smoker   12/24/2016    Past Surgical History:  Procedure Laterality Date  . CARDIOVERSION  07/31/2017  . CATARACT EXTRACTION, BILATERAL    . COLON SURGERY  2009   segmental sigmoid resection  . COLONOSCOPY    . CYSTOSCOPY W/ RETROGRADES Right 06/07/2018   Procedure: CYSTOSCOPY WITH RETROGRADE PYELOGRAM, uphrostogram;  Surgeon: Cleon Gustin, MD;  Location: [facility];  Service: Urology;  Laterality: Right;  . CYSTOSCOPY WITH URETEROSCOPY AND STENT PLACEMENT Right 02/28/2018   Procedure: CYSTOSCOPY WITH URETEROSCOPY Concha Se;  Surgeon: Kathie Rhodes, MD;  Location: [facility];  Service: Urology;  Laterality: Right;  . CYSTOSCOPY/URETEROSCOPY/HOLMIUM LASER/STENT PLACEMENT Right 11/29/2017   Procedure: CYSTOSCOPY/RETROGRADE/URETEROSCOPY/HOLMIUM LASER/STENT PLACEMENT;  Surgeon: Kathie Rhodes, MD;  Location: [facility];  Service: Urology;  Laterality: Right;  . EYE SURGERY Bilateral 01/12/2017   cataract removal  . HOLMIUM LASER APPLICATION Right 3/76/2831   Procedure: HOLMIUM LASER APPLICATION;  Surgeon: Kathie Rhodes, MD;  Location: [facility];  Service: Urology;  Laterality: Right;  . IR BALLOON DILATION URETERAL STRICTURE RIGHT  03/14/2018  . IR NEPHRO TUBE REMOV/FL  03/17/2018   . IR NEPHROSTOGRAM RIGHT THRU EXISTING ACCESS  03/17/2018  . IR NEPHROSTOMY EXCHANGE RIGHT  03/14/2018  . IR NEPHROSTOMY EXCHANGE RIGHT  06/21/2018  . IR NEPHROSTOMY PLACEMENT RIGHT  03/02/2018  . IR NEPHROSTOMY PLACEMENT RIGHT  04/20/2018  . IR URETERAL STENT PLACEMENT EXISTING ACCESS RIGHT  03/14/2018  . NOSE SURGERY     Submucous resection age 72  . NUCLEAR STRESS TEST  06/03/2009  . ROBOT ASSISTED LAPAROSCOPIC NEPHRECTOMY Right 08/04/2018  Procedure: XI ROBOTIC ASSISTED LAPAROSCOPIC NEPHRECTOMY;  Surgeon: Cleon Gustin, MD;  Location: [facility];  Service: Urology;  Laterality: Right;  3 HRS  . ROBOT ASSISTED PYELOPLASTY Right 06/07/2018   Procedure: attempted XI ROBOTIC ASSISTED PYELOPLASTY, lysis of adhesions;  Surgeon: Cleon Gustin, MD;  Location: [facility];  Service: Urology;  Laterality: Right;  3 HRS  . SKIN BIOPSY    . URETEROSCOPY WITH HOLMIUM LASER LITHOTRIPSY Right 09/24/2017   Procedure: CYSTOSCOPY, URETEROSCOPY WITH HOLMIUM LASER LITHOTRIPSY, STENT PLACEMENT;  Surgeon: Kathie Rhodes, MD;  [facility]  Service: Urology;  Laterality: Right;    Family History  Problem Relation Age of Onset  . Heart disease Mother   . Hypertension Mother   . Stroke Mother   . Colon cancer Mother   . Breast cancer Mother   . Heart disease Father        pacemaker  . Aortic aneurysm Father   . Hypertension Father   . Heart disease Sister   . Atrial fibrillation Sister   . Obesity Sister   . Sleep apnea Sister   . Heart attack Brother   . Other Brother        muscle disease  . Arthritis Brother   . Stroke Brother   . Atrial fibrillation Brother   . Cancer Maternal Grandmother        ?  Marland Kitchen Heart attack Maternal Grandmother   . Diabetes Maternal Grandmother   . Cancer Maternal Grandfather        hodgin's lymphoma  . Heart attack Paternal Grandmother   . Anxiety disorder Paternal Grandmother   . Pneumonia Paternal Grandfather   . Heart attack Brother   .  Atrial fibrillation Brother   . Atrial fibrillation Brother   . Heart attack Brother   . Hypertension Brother   . Hyperlipidemia Brother   . Heart attack Brother   . Other Brother        heart valve operation  . Atrial fibrillation Brother     Allergies  Allergen Reactions  . Cefaclor Hives  . Cephalosporins Hives  . Penicillins Rash    Mild maculopapular rash Has patient had a PCN reaction causing immediate rash, facial/tongue/throat swelling, SOB or lightheadedness with hypotension: No Has patient had a PCN reaction causing severe rash involving mucus membranes or skin necrosis: No Has patient had a PCN reaction that required hospitalization: No Has patient had a PCN reaction occurring within the last 10 years: No If all of the above answers are "NO", then may proceed with Cephalosporin use.     Current Outpatient Medications on File Prior to Visit  Medication Sig Dispense Refill  . acetaminophen (TYLENOL) 500 MG tablet Take 1,000 mg by mouth every 6 (six) hours as needed (pain).     Marland Kitchen atenolol (TENORMIN) 25 MG tablet TAKE ONE TABLET BY MOUTH TWICE A DAY (Patient taking differently: Take 25 mg by mouth 2 (two) times daily. ) 180 tablet 2  . bisacodyl (DULCOLAX) 5 MG EC tablet Take 5 mg by mouth daily as needed for moderate constipation.    . carboxymethylcellulose (REFRESH PLUS) 0.5 % SOLN Place 1 drop into both eyes daily.    . clotrimazole-betamethasone (LOTRISONE) cream Apply 1 application topically 2 (two) times daily as needed (yeast infection).  30 g 0  . Colchicine 0.6 MG CAPS 1 tab po bid (Patient taking differently: Take 0.6 mg by mouth 2 (two) times daily as needed (gout). 1 tab po bid)  60 capsule 0  . cyclobenzaprine (FLEXERIL) 5 MG tablet Take 1 tablet (5 mg total) by mouth 3 (three) times daily as needed for muscle spasms. 30 tablet 2  . FLUoxetine (PROZAC) 10 MG tablet Take 1 tablet (10 mg total) by mouth 2 (two) times a day. 60 tablet 3  . fluticasone (FLONASE) 50  MCG/ACT nasal spray Place 2 sprays into the nose daily as needed for allergies. For congestion    . HYDROcodone-acetaminophen (NORCO) 5-325 MG tablet Take 1-2 tablets by mouth every 6 (six) hours as needed for moderate pain or severe pain. 20 tablet 0  . ketoconazole (NIZORAL) 2 % cream Apply 1 application topically daily. (Patient taking differently: Apply 1 application topically once a week. ) 60 g 1  . LORazepam (ATIVAN) 1 MG tablet Take 1 tablet (1 mg total) by mouth 2 (two) times daily as needed for anxiety or sedation. (Patient taking differently: Take 0.5 mg by mouth 2 (two) times daily. ) 180 tablet 1  . Menthol, Topical Analgesic, (ICY HOT BACK EX) Apply 1 application topically daily as needed (back pain).    Marland Kitchen omeprazole (PRILOSEC) 20 MG capsule Take 20 mg by mouth daily as needed (acid reflux).      No current facility-administered medications on file prior to visit.     BP 127/75   Pulse 69   Temp 98.4 F (36.9 C) (Oral)   Ht 6' (1.829 m)   Wt 205 lb (93 kg)   SpO2 94%   BMI 27.80 kg/m    Observations/Objective:   Gen: Awake, alert, no acute distress Resp: Breathing is even and non-labored Psych: calm/pleasant demeanor Neuro: Alert and Oriented x 3, + facial symmetry, speech is clear.   Assessment and Plan:  Seasonal allergies- suspect throat irritation due to allergies and post nasal drip. Advised pt to begin claritin 10mg  once daily for the next few weeks as well as salt water gargles. Pt is advised to call if new/worsening symptoms.  Pt verbalizes understanding.   Follow Up Instructions:    I discussed the assessment and treatment plan with the patient. The patient was provided an opportunity to ask questions and all were answered. The patient agreed with the plan and demonstrated an understanding of the instructions.   The patient was advised to call back or seek an in-person evaluation if the symptoms worsen or if the condition fails to improve as  anticipated.  Nance Pear, NP

## 2018-08-17 NOTE — Telephone Encounter (Signed)
Appt scheduled

## 2018-09-08 ENCOUNTER — Encounter: Payer: Self-pay | Admitting: *Deleted

## 2018-09-20 ENCOUNTER — Other Ambulatory Visit: Payer: Self-pay

## 2018-09-21 ENCOUNTER — Other Ambulatory Visit: Payer: Self-pay | Admitting: Family Medicine

## 2018-09-22 NOTE — Telephone Encounter (Signed)
Last OV 08/17/18 Lorazepam last filled 03/22/18 #180 with 0

## 2018-09-26 ENCOUNTER — Telehealth: Payer: Self-pay

## 2018-09-26 ENCOUNTER — Other Ambulatory Visit: Payer: Self-pay

## 2018-09-26 ENCOUNTER — Ambulatory Visit (INDEPENDENT_AMBULATORY_CARE_PROVIDER_SITE_OTHER): Payer: Medicare Other | Admitting: Family Medicine

## 2018-09-26 VITALS — BP 128/68 | HR 63 | Temp 96.5°F | Resp 18 | Wt 210.0 lb

## 2018-09-26 DIAGNOSIS — I10 Essential (primary) hypertension: Secondary | ICD-10-CM

## 2018-09-26 DIAGNOSIS — R252 Cramp and spasm: Secondary | ICD-10-CM

## 2018-09-26 DIAGNOSIS — R972 Elevated prostate specific antigen [PSA]: Secondary | ICD-10-CM

## 2018-09-26 DIAGNOSIS — B360 Pityriasis versicolor: Secondary | ICD-10-CM

## 2018-09-26 DIAGNOSIS — E782 Mixed hyperlipidemia: Secondary | ICD-10-CM

## 2018-09-26 DIAGNOSIS — R739 Hyperglycemia, unspecified: Secondary | ICD-10-CM | POA: Diagnosis not present

## 2018-09-26 DIAGNOSIS — D649 Anemia, unspecified: Secondary | ICD-10-CM

## 2018-09-26 DIAGNOSIS — M546 Pain in thoracic spine: Secondary | ICD-10-CM | POA: Insufficient documentation

## 2018-09-26 DIAGNOSIS — K760 Fatty (change of) liver, not elsewhere classified: Secondary | ICD-10-CM

## 2018-09-26 DIAGNOSIS — B356 Tinea cruris: Secondary | ICD-10-CM | POA: Diagnosis not present

## 2018-09-26 DIAGNOSIS — N289 Disorder of kidney and ureter, unspecified: Secondary | ICD-10-CM

## 2018-09-26 DIAGNOSIS — L989 Disorder of the skin and subcutaneous tissue, unspecified: Secondary | ICD-10-CM

## 2018-09-26 DIAGNOSIS — F418 Other specified anxiety disorders: Secondary | ICD-10-CM

## 2018-09-26 DIAGNOSIS — C4491 Basal cell carcinoma of skin, unspecified: Secondary | ICD-10-CM

## 2018-09-26 MED ORDER — KETOCONAZOLE 1 % EX SHAM: 1.0000 | MEDICATED_SHAMPOO | CUTANEOUS | 1 refills | Status: DC

## 2018-09-26 NOTE — Assessment & Plan Note (Signed)
Well controlled, no changes to meds. Encouraged heart healthy diet such as the DASH diet and exercise as tolerated.  °

## 2018-09-26 NOTE — Patient Instructions (Signed)
Acute Back Pain, Adult Acute back pain is sudden and usually short-lived. It is often caused by an injury to the muscles and tissues in the back. The injury may result from:  A muscle or ligament getting overstretched or torn (strained). Ligaments are tissues that connect bones to each other. Lifting something improperly can cause a back strain.  Wear and tear (degeneration) of the spinal disks. Spinal disks are circular tissue that provides cushioning between the bones of the spine (vertebrae).  Twisting motions, such as while playing sports or doing yard work.  A hit to the back.  Arthritis. You may have a physical exam, lab tests, and imaging tests to find the cause of your pain. Acute back pain usually goes away with rest and home care. Follow these instructions at home: Managing pain, stiffness, and swelling  Take over-the-counter and prescription medicines only as told by your health care provider.  Your health care provider may recommend applying ice during the first 24-48 hours after your pain starts. To do this: ? Put ice in a plastic bag. ? Place a towel between your skin and the bag. ? Leave the ice on for 20 minutes, 2-3 times a day.  If directed, apply heat to the affected area as often as told by your health care provider. Use the heat source that your health care provider recommends, such as a moist heat pack or a heating pad. ? Place a towel between your skin and the heat source. ? Leave the heat on for 20-30 minutes. ? Remove the heat if your skin turns bright red. This is especially important if you are unable to feel pain, heat, or cold. You have a greater risk of getting burned. Activity   Do not stay in bed. Staying in bed for more than 1-2 days can delay your recovery.  Sit up and stand up straight. Avoid leaning forward when you sit, or hunching over when you stand. ? If you work at a desk, sit close to it so you do not need to lean over. Keep your chin tucked  in. Keep your neck drawn back, and keep your elbows bent at a right angle. Your arms should look like the letter "L." ? Sit high and close to the steering wheel when you drive. Add lower back (lumbar) support to your car seat, if needed.  Take short walks on even surfaces as soon as you are able. Try to increase the length of time you walk each day.  Do not sit, drive, or stand in one place for more than 30 minutes at a time. Sitting or standing for long periods of time can put stress on your back.  Do not drive or use heavy machinery while taking prescription pain medicine.  Use proper lifting techniques. When you bend and lift, use positions that put less stress on your back: ? Bend your knees. ? Keep the load close to your body. ? Avoid twisting.  Exercise regularly as told by your health care provider. Exercising helps your back heal faster and helps prevent back injuries by keeping muscles strong and flexible.  Work with a physical therapist to make a safe exercise program, as recommended by your health care provider. Do any exercises as told by your physical therapist. Lifestyle  Maintain a healthy weight. Extra weight puts stress on your back and makes it difficult to have good posture.  Avoid activities or situations that make you feel anxious or stressed. Stress and anxiety increase muscle   tension and can make back pain worse. Learn ways to manage anxiety and stress, such as through exercise. General instructions  Sleep on a firm mattress in a comfortable position. Try lying on your side with your knees slightly bent. If you lie on your back, put a pillow under your knees.  Follow your treatment plan as told by your health care provider. This may include: ? Cognitive or behavioral therapy. ? Acupuncture or massage therapy. ? Meditation or yoga. Contact a health care provider if:  You have pain that is not relieved with rest or medicine.  You have increasing pain going down  into your legs or buttocks.  Your pain does not improve after 2 weeks.  You have pain at night.  You lose weight without trying.  You have a fever or chills. Get help right away if:  You develop new bowel or bladder control problems.  You have unusual weakness or numbness in your arms or legs.  You develop nausea or vomiting.  You develop abdominal pain.  You feel faint. Summary  Acute back pain is sudden and usually short-lived.  Use proper lifting techniques. When you bend and lift, use positions that put less stress on your back.  Take over-the-counter and prescription medicines and apply heat or ice as directed by your health care provider. This information is not intended to replace advice given to you by your health care provider. Make sure you discuss any questions you have with your health care provider. Document Released: 12/29/2004 Document Revised: 04/19/2018 Document Reviewed: 08/12/2016 Elsevier Patient Education  2020 [employer] Inc[employer]  

## 2018-09-26 NOTE — Assessment & Plan Note (Signed)
Minimize simple carbohydrates and monitor 

## 2018-09-26 NOTE — Telephone Encounter (Signed)
Yes OK to switch to 2% with same sig and make it active for 6 months.

## 2018-09-26 NOTE — Assessment & Plan Note (Signed)
Hydrate and monitor 

## 2018-09-26 NOTE — Assessment & Plan Note (Signed)
Tighten between shoulder blades. Check xrays. Encouraged moist heat and gentle stretching as tolerated. May try NSAIDs and prescription meds as directed and report if symptoms worsen or seek immediate care

## 2018-09-26 NOTE — Telephone Encounter (Signed)
Copied from [place] [id number]. Topic: General - Other >> Sep 26, 2018  3:12 PM Carolyn Stare wrote: Pharmacy call they dont make 1% of  KETOCONAZOLE, TOPICAL, 1 % SHAM    ask if she can switch to 2% and she nee to know how big the area and how long he need to use the shampoo  Spoke with Levada Dy

## 2018-09-26 NOTE — Assessment & Plan Note (Signed)
Right ear. Encouraged to go see his dermatologist Dr Ronnald Ramp

## 2018-09-26 NOTE — Assessment & Plan Note (Signed)
Has had recurrent issues in groin and on penis. Is using topical treatments and oral treatments.

## 2018-09-26 NOTE — Assessment & Plan Note (Signed)
S/p nephrectomy and recovering well.

## 2018-09-26 NOTE — Assessment & Plan Note (Signed)
Follows with Dr Ronnald Ramp

## 2018-09-26 NOTE — Assessment & Plan Note (Signed)
Encouraged heart healthy diet, increase exercise, avoid trans fats, consider a krill oil cap daily 

## 2018-09-26 NOTE — Assessment & Plan Note (Signed)
hgba1c acceptable, minimize simple carbs. Increase exercise as tolerated.  

## 2018-09-27 MED ORDER — KETOCONAZOLE 2 % EX SHAM: 1.0000 "application " | MEDICATED_SHAMPOO | CUTANEOUS | 12 refills | Status: DC

## 2018-09-27 NOTE — Telephone Encounter (Signed)
Medication sent to pharmacy  

## 2018-09-28 DIAGNOSIS — B360 Pityriasis versicolor: Secondary | ICD-10-CM | POA: Insufficient documentation

## 2018-09-28 NOTE — Assessment & Plan Note (Signed)
Is doing better as his health has improved.

## 2018-09-28 NOTE — Assessment & Plan Note (Signed)
On chest, given ketoconazole shampoo 2% to clean with one to two times a week

## 2018-09-28 NOTE — Progress Notes (Signed)
Subjective:    Patient ID: David [name], male    DOB: 1945/05/23, 73 y.o.   MRN: [id number]  Chief Complaint  Patient presents with  . Follow-up    HPI Patient is in today for follow up on chronic medical concerns including renal insufficeny after his nephrectomy, hypertension, gout and more. His incision site on his right lower abdominal wall from his nephrectomy is healing slowly but healing well. No recent febrile illness. Is maintaining quarantine well. His gout responded to Colchicine. Denies CP/palp/SOB/HA/congestion/fevers/GI or GU c/o. Taking meds as prescribed  Past Medical History:  Diagnosis Date  . Abnormal liver function 03/18/2013  . Adenoma of right adrenal gland 07/11/2002   2.4cm , noted on CT ABD  . Anxiety   . Arthritis of both knees 03/26/2016  . Astigmatism   . Atrial fibrillation ([place])    x1 successful cardioversions  . Back pain   . BCC (basal cell carcinoma of skin)    under right eye and right ear  . Cataract 09/29/2016  . Depression   . Diverticulitis   . Diverticulosis   . Fatty liver   . GERD (gastroesophageal reflux disease)   . Gout   . Hearing loss of both ears 03/26/2016  . History of chronic prostatitis    started at age 8  . History of kidney stones   . Hyperglycemia 03/29/2016  . Hyperlipidemia   . Hypertension   . IBS (irritable bowel syndrome) 03/18/2013  . Incomplete right bundle branch block (RBBB)   . Internal hemorrhoids   . Kidney lesion 06/07/2015   Right midportion, 1.1x1.1 cm hyperechoic, noted on [place] ABD  . LAFB (left anterior fascicular block)   . Left anterior fascicular block 08/06/2017   Noted on EKG  . Lipoma of axilla 09/2016  . Liver lesion, right lobe 06/07/2015   2.5x2.4x2.4 cm hypoechoic lesion posterior aspect, noted on [place] ABD  . Low back pain 03/26/2016  . LVH (left ventricular hypertrophy) 08/06/2017   Moderate, noted on ECHO  . LVH (left ventricular hypertrophy) 08/06/2017   Moderate, Noted on ECHO  .  Medicare annual wellness visit, subsequent 03/12/2014  . Muscle cramps   . Nocturia   . OA (osteoarthritis)    Back, Hands, Neck  . Obesity 11/25/2007   Qualifier: Diagnosis of  By: Lenna Gilford MD, Deborra Medina   . Other malaise and fatigue 03/13/2013  . Penis symptom or sign    PENIS RED NO DRAINAGE   . Premature ventricular complex   . Preventative health care 03/07/2015  . Renal insufficiency   . Scoliosis    Upper thoracic and lumbar  . Sinus bradycardia 08/06/2017   Noted on EKG  . Vitamin D deficiency    resolved    Past Surgical History:  Procedure Laterality Date  . CARDIOVERSION  07/31/2017  . CATARACT EXTRACTION, BILATERAL    . COLON SURGERY  2009   segmental sigmoid resection  . COLONOSCOPY    . CYSTOSCOPY W/ RETROGRADES Right 06/07/2018   Procedure: CYSTOSCOPY WITH RETROGRADE PYELOGRAM, uphrostogram;  Surgeon: Cleon Gustin, MD;  Location: [facility];  Service: Urology;  Laterality: Right;  . CYSTOSCOPY WITH URETEROSCOPY AND STENT PLACEMENT Right 02/28/2018   Procedure: CYSTOSCOPY WITH URETEROSCOPY Con[place] Se;  Surgeon: Kathie Rhodes, MD;  Location[facility];  Service: Urology;  Laterality: Right;  . CYSTOSCOPY/URETEROSCOPY/HOLMIUM LASER/STENT PLACEMENT Right 11/29/2017   Procedure: CYSTOSCOPY/RETROGRADE/URETEROSCOPY/HOLMIUM LASER/STENT PLACEMENT;  Surgeon: Kathie Rhodes, MD;  Location: [facility];  Service: Urology;  Laterality: Right;  . EYE SURGERY Bilateral 01/12/2017   cataract removal  . HOLMIUM LASER APPLICATION Right [id number]   Procedure: HOLMIUM LASER APPLICATION;  Surgeon: Kathie Rhodes, MD;  Location: [facility];  Service: Urology;  Laterality: Right;  . IR BALLOON DILATION URETERAL STRICTURE RIGHT  03/14/2018  . IR NEPHRO TUBE REMOV/FL  03/17/2018  . IR NEPHROSTOGRAM RIGHT THRU EXISTING ACCESS  03/17/2018  . IR NEPHROSTOMY EXCHANGE RIGHT  03/14/2018  . IR NEPHROSTOMY EXCHANGE RIGHT  06/21/2018  . IR NEPHROSTOMY PLACEMENT RIGHT  03/02/2018  .  IR NEPHROSTOMY PLACEMENT RIGHT  04/20/2018  . IR URETERAL STENT PLACEMENT EXISTING ACCESS RIGHT  03/14/2018  . NOSE SURGERY     Submucous resection age 74  . NUCLEAR STRESS TEST  06/03/2009  . ROBOT ASSISTED LAPAROSCOPIC NEPHRECTOMY Right 08/04/2018   Procedure: XI ROBOTIC ASSISTED LAPAROSCOPIC NEPHRECTOMY;  Surgeon: Cleon Gustin, MD;  Location: [facility];  Service: Urology;  Laterality: Right;  3 HRS  . ROBOT ASSISTED PYELOPLASTY Right 06/07/2018   Procedure: attempted XI ROBOTIC ASSISTED PYELOPLASTY, lysis of adhesions;  Surgeon: Cleon Gustin, MD;  Location: [facility];  Service: Urology;  Laterality: Right;  3 HRS  . SKIN BIOPSY    . URETEROSCOPY WITH HOLMIUM LASER LITHOTRIPSY Right 09/24/2017   Procedure: CYSTOSCOPY, URETEROSCOPY WITH HOLMIUM LASER LITHOTRIPSY, STENT PLACEMENT;  Surgeon: Kathie Rhodes, MD;  Location: [facility];  Service: Urology;  Laterality: Right;    Family History  Problem Relation Age of Onset  . Heart disease Mother   . Hypertension Mother   . Stroke Mother   . Colon cancer Mother   . Breast cancer Mother   . Heart disease Father        pacemaker  . Aortic aneurysm Father   . Hypertension Father   . Heart disease Sister   . Atrial fibrillation Sister   . Obesity Sister   . Sleep apnea Sister   . Heart attack Brother   . Other Brother        muscle disease  . Arthritis Brother   . Stroke Brother   . Atrial fibrillation Brother   . Cancer Maternal Grandmother        ?  Marland Kitchen Heart attack Maternal Grandmother   . Diabetes Maternal Grandmother   . Cancer Maternal Grandfather        hodgin's lymphoma  . Heart attack Paternal Grandmother   . Anxiety disorder Paternal Grandmother   . Pneumonia Paternal Grandfather   . Heart attack Brother   . Atrial fibrillation Brother   . Atrial fibrillation Brother   . Heart attack Brother   . Hypertension Brother   . Hyperlipidemia Brother   . Heart attack Brother   . Other Brother        heart  valve operation  . Atrial fibrillation Brother     Social History   Socioeconomic History  . Marital status: Married    Spouse name: Not on file  . Number of children: 3  . Years of education: Not on file  . Highest education level: Not on file  Occupational History  . Occupation: Nurse, adult: [employer]  . Financial resource strain: Not on file  . Food insecurity    Worry: Not on file    Inability: Not on file  . Transportation needs    Medical: Not on file    Non-medical: Not on file  Tobacco Use  . Smoking status:  Former Smoker    Packs/day: 1.00    Years: 6.00    Pack years: 6.00    Types: Cigarettes    Start date: 01/12/1969  . Smokeless tobacco: Never Used  . Tobacco comment: [id number]  Substance and Sexual Activity  . Alcohol use: Not Currently    Comment: NONE SINCE July 18 2017  . Drug use: No  . Sexual activity: Yes  Lifestyle  . Physical activity    Days per week: Not on file    Minutes per session: Not on file  . Stress: Not on file  Relationships  . Social Herbalist on phone: Not on file    Gets together: Not on file    Attends religious service: Not on file    Active member of club or organization: Not on file    Attends meetings of clubs or organizations: Not on file    Relationship status: Not on file  . Intimate partner violence    Fear of current or ex partner: Not on file    Emotionally abused: Not on file    Physically abused: Not on file    Forced sexual activity: Not on file  Other Topics Concern  . Not on file  Social History Narrative   The patient is married for the second time, he has 3 sons.   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   2 alcoholic beverages most days.   1 caffeinated beverage daily   No drug use no current tobacco use he is a prior smoker   12/24/2016    Outpatient Medications Prior to Visit  Medication Sig Dispense Refill  . acetaminophen (TYLENOL) 500 MG tablet Take 1,000 mg by mouth  every 6 (six) hours as needed (pain).     Marland Kitchen atenolol (TENORMIN) 25 MG tablet TAKE ONE TABLET BY MOUTH TWICE A DAY 180 tablet 1  . bisacodyl (DULCOLAX) 5 MG EC tablet Take 5 mg by mouth daily as needed for moderate constipation.    . carboxymethylcellulose (REFRESH PLUS) 0.5 % SOLN Place 1 drop into both eyes daily.    . clotrimazole-betamethasone (LOTRISONE) cream Apply 1 application topically 2 (two) times daily as needed (yeast infection).  30 g 0  . Colchicine 0.6 MG CAPS 1 tab po bid (Patient taking differently: Take 0.6 mg by mouth 2 (two) times daily as needed (gout). 1 tab po bid) 60 capsule 0  . cyclobenzaprine (FLEXERIL) 5 MG tablet Take 1 tablet (5 mg total) by mouth 3 (three) times daily as needed for muscle spasms. 30 tablet 2  . FLUoxetine (PROZAC) 10 MG tablet Take 1 tablet (10 mg total) by mouth 2 (two) times a day. 60 tablet 3  . fluticasone (FLONASE) 50 MCG/ACT nasal spray Place 2 sprays into the nose daily as needed for allergies. For congestion    . HYDROcodone-acetaminophen (NORCO) 5-325 MG tablet Take 1-2 tablets by mouth every 6 (six) hours as needed for moderate pain or severe pain. 20 tablet 0  . ketoconazole (NIZORAL) 2 % cream Apply 1 application topically daily. (Patient taking differently: Apply 1 application topically once a week. ) 60 g 1  . LORazepam (ATIVAN) 1 MG tablet TAKE ONE TABLET BY MOUTH TWICE A DAY AS NEEDED FOR ANXIETY OR  SEDATION 180 tablet 0  . Menthol, Topical Analgesic, (ICY HOT BACK EX) Apply 1 application topically daily as needed (back pain).    Marland Kitchen omeprazole (PRILOSEC) 20 MG capsule Take 20 mg by mouth  daily as needed (acid reflux).      No facility-administered medications prior to visit.     Allergies  Allergen Reactions  . Cefaclor Hives  . Cephalosporins Hives  . Penicillins Rash    Mild maculopapular rash Has patient had a PCN reaction causing immediate rash, facial/tongue/throat swelling, SOB or lightheadedness with hypotension: No Has  patient had a PCN reaction causing severe rash involving mucus membranes or skin necrosis: No Has patient had a PCN reaction that required hospitalization: No Has patient had a PCN reaction occurring within the last 10 years: No If all of the above answers are "NO", then may proceed with Cephalosporin use.     Review of Systems  Constitutional: Negative for fever and malaise/fatigue.  HENT: Negative for congestion.   Eyes: Negative for blurred vision.  Respiratory: Negative for shortness of breath.   Cardiovascular: Negative for chest pain, palpitations and leg swelling.  Gastrointestinal: Negative for abdominal pain, blood in stool and nausea.  Genitourinary: Negative for dysuria and frequency.  Musculoskeletal: Negative for falls.  Skin: Negative for rash.  Neurological: Negative for dizziness, loss of consciousness and headaches.  Endo/Heme/Allergies: Negative for environmental allergies.  Psychiatric/Behavioral: Negative for depression. The patient is not nervous/anxious.        Objective:    Physical Exam Vitals signs and nursing note reviewed.  Constitutional:      General: He is not in acute distress.    Appearance: He is well-developed.  HENT:     Head: Normocephalic and atraumatic.     Nose: Nose normal.  Eyes:     General:        Right eye: No discharge.        Left eye: No discharge.  Neck:     Musculoskeletal: Normal range of motion and neck supple.  Cardiovascular:     Rate and Rhythm: Normal rate and regular rhythm.     Heart sounds: No murmur.  Pulmonary:     Effort: Pulmonary effort is normal.     Breath sounds: Normal breath sounds.  Abdominal:     General: Bowel sounds are normal.     Palpations: Abdomen is soft.     Tenderness: There is no abdominal tenderness.     Comments: RLQ incision with small scab at caudal en but no fluctuance or erythema.   Skin:    General: Skin is warm and dry.     Findings: Rash present.     Comments: Faint, slightly  scaly rash on upper chest.   Neurological:     Mental Status: He is alert and oriented to person, place, and time.     BP 128/68 (BP Location: Left Arm, Patient Position: Sitting, Cuff Size: Normal)   Pulse 63   Temp (!) 96.5 F (35.8 C) (Temporal)   Resp 18   Wt 210 lb (95.3 kg)   SpO2 98%   BMI 28.48 kg/m  Wt Readings from Last 3 Encounters:  09/26/18 210 lb (95.3 kg)  08/17/18 205 lb (93 kg)  08/08/18 212 lb 3.2 oz (96.3 kg)    Diabetic Foot Exam - Simple   No data filed     Lab Results  Component Value Date   WBC 8.1 08/08/2018   HGB 13.2 08/08/2018   HCT 38.9 (L) 08/08/2018   PLT 275.0 08/08/2018   GLUCOSE 85 08/08/2018   CHOL 179 08/08/2018   TRIG 89.0 08/08/2018   HDL 40.90 08/08/2018   LDLDIRECT 168.5 03/29/2006   LDLCALC 120 (  H) 08/08/2018   ALT 15 08/08/2018   AST 18 08/08/2018   NA 135 08/08/2018   K 4.2 08/08/2018   CL 99 08/08/2018   CREATININE 1.75 (H) 08/08/2018   BUN 15 08/08/2018   CO2 28 08/08/2018   TSH 1.65 08/08/2018   PSA 4.4 (H) 07/01/2018   INR 1.0 04/20/2018   HGBA1C 5.5 08/08/2018    Lab Results  Component Value Date   TSH 1.65 08/08/2018   Lab Results  Component Value Date   WBC 8.1 08/08/2018   HGB 13.2 08/08/2018   HCT 38.9 (L) 08/08/2018   MCV 91.0 08/08/2018   PLT 275.0 08/08/2018   Lab Results  Component Value Date   NA 135 08/08/2018   K 4.2 08/08/2018   CO2 28 08/08/2018   GLUCOSE 85 08/08/2018   BUN 15 08/08/2018   CREATININE 1.75 (H) 08/08/2018   BILITOT 0.9 08/08/2018   ALKPHOS 80 08/08/2018   AST 18 08/08/2018   ALT 15 08/08/2018   PROT 6.5 08/08/2018   ALBUMIN 3.8 08/08/2018   CALCIUM 10.1 08/08/2018   ANIONGAP 11 08/05/2018   GFR 38.37 (L) 08/08/2018   Lab Results  Component Value Date   CHOL 179 08/08/2018   Lab Results  Component Value Date   HDL 40.90 08/08/2018   Lab Results  Component Value Date   LDLCALC 120 (H) 08/08/2018   Lab Results  Component Value Date   TRIG 89.0  08/08/2018   Lab Results  Component Value Date   CHOLHDL 4 08/08/2018   Lab Results  Component Value Date   HGBA1C 5.5 08/08/2018       Assessment & Plan:   Problem List Items Addressed This Visit    Fatty liver disease, nonalcoholic    Minimize simple carbohydrates and monitor      Hyperlipidemia, mixed    Encouraged heart healthy diet, increase exercise, avoid trans fats, consider a krill oil cap daily      Relevant Orders   Lipid panel   Essential hypertension, benign    Well controlled, no changes to meds. Encouraged heart healthy diet such as the DASH diet and exercise as tolerated.       Relevant Orders   CBC w/Diff   CBC   Comprehensive metabolic panel   TSH   BCC (basal cell carcinoma of skin)    Follows with Dr Ronnald Ramp      Renal insufficiency    Hydrate and monitor      Relevant Orders   Comprehensive metabolic panel   Skin lesion    Right ear. Encouraged to go see his dermatologist Dr Ronnald Ramp      Tinea cruris    Has had recurrent issues in groin and on penis. Is using topical treatments and oral treatments.       Hyperglycemia    hgba1c acceptable, minimize simple carbs. Increase exercise as tolerated.       Relevant Orders   Comprehensive metabolic panel   Hemoglobin A1c   Muscle cramp   Relevant Orders   Magnesium   Nonfunctioning kidney    S/p nephrectomy and recovering well.      Thoracic back pain    Tighten between shoulder blades. Check xrays. Encouraged moist heat and gentle stretching as tolerated. May try NSAIDs and prescription meds as directed and report if symptoms worsen or seek immediate care       Other Visit Diagnoses    Elevated PSA    -  Primary  Relevant Orders   PSA   CBC w/Diff   Anemia, unspecified type       Relevant Orders   CBC w/Diff      I am having David [name] "Fred" maintain his fluticasone, carboxymethylcellulose, (Menthol, Topical Analgesic, (ICY HOT BACK EX)), omeprazole, acetaminophen,  bisacodyl, Lotrisone, cyclobenzaprine, Colchicine, FLUoxetine, ketoconazole, HYDROcodone-acetaminophen, atenolol, and LORazepam.  Meds ordered this encounter  Medications  . DISCONTD: KETOCONAZOLE, TOPICAL, 1 % SHAM    Sig: Apply 1 Dose topically 2 (two) times a week.    Dispense:  200 mL    Refill:  1     Penni Homans, MD

## 2018-10-13 ENCOUNTER — Encounter: Payer: Self-pay | Admitting: Family Medicine

## 2018-10-14 ENCOUNTER — Other Ambulatory Visit: Payer: Self-pay | Admitting: Family Medicine

## 2018-10-14 MED ORDER — FLUCONAZOLE 150 MG PO TABS: 150.0000 mg | ORAL_TABLET | ORAL | 0 refills | Status: DC

## 2018-10-30 DIAGNOSIS — I517 Cardiomegaly: Secondary | ICD-10-CM | POA: Insufficient documentation

## 2018-10-30 NOTE — Progress Notes (Signed)
[facility]    Cardiology Office Note   Date:  10/31/2018   ID:  David [name], DOB 12/11/1945, MRN [id number]  PCP:  Mosie Lukes, MD  Cardiologist:   Minus Breeding, MD   Chief Complaint  Patient presents with  . Atrial Fibrillation      History of Present Illness: David [name] is a 73 y.o. male who presents for follow up of atrial fib.  He has been seen in the Atrial Fib clinic.  He has had cardioversion.    Since I saw him last year he has had 7 procedures on his kidney because of kidney stones.  He had nephrostomy tubes.  He ultimately had a nephrectomy on the right side.  Prior to all of this he started on a diet and exercise and over the year he has lost about 70 pounds.  He says he feels very good.  During all of this he had hematuria and stopped his Xarelto.  He restarted and had further hematuria.  He has been off the Xarelto now and having no further bleeding.  He has had no suggestion of atrial fibrillation despite all of his many procedures.  He says symptomatically he always feels his fibrillation and he felt none of it.   Past Medical History:  Diagnosis Date  . Abnormal liver function 03/18/2013  . Adenoma of right adrenal gland 07/11/2002   2.4cm , noted on CT ABD  . Anxiety   . Arthritis of both knees 03/26/2016  . Astigmatism   . Atrial fibrillation ([place])    x1 successful cardioversions  . Back pain   . BCC (basal cell carcinoma of skin)    under right eye and right ear  . Cataract 09/29/2016  . Depression   . Diverticulitis   . Diverticulosis   . Fatty liver   . GERD (gastroesophageal reflux disease)   . Gout   . Hearing loss of both ears 03/26/2016  . History of chronic prostatitis    started at age 83  . History of kidney stones   . Hyperglycemia 03/29/2016  . Hyperlipidemia   . Hypertension   . IBS (irritable bowel syndrome) 03/18/2013  . Incomplete right bundle branch block (RBBB)   . Internal hemorrhoids   . Kidney lesion 06/07/2015   Right  midportion, 1.1x1.1 cm hyperechoic, noted on [place] ABD  . LAFB (left anterior fascicular block)   . Left anterior fascicular block 08/06/2017   Noted on EKG  . Lipoma of axilla 09/2016  . Liver lesion, right lobe 06/07/2015   2.5x2.4x2.4 cm hypoechoic lesion posterior aspect, noted on [place] ABD  . Low back pain 03/26/2016  . LVH (left ventricular hypertrophy) 08/06/2017   Moderate, noted on ECHO  . LVH (left ventricular hypertrophy) 08/06/2017   Moderate, Noted on ECHO  . Medicare annual wellness visit, subsequent 03/12/2014  . Muscle cramps   . Nocturia   . OA (osteoarthritis)    Back, Hands, Neck  . Obesity 11/25/2007   Qualifier: Diagnosis of  By: Lenna Gilford MD, Deborra Medina   . Other malaise and fatigue 03/13/2013  . Penis symptom or sign    PENIS RED NO DRAINAGE   . Premature ventricular complex   . Preventative health care 03/07/2015  . Renal insufficiency   . Scoliosis    Upper thoracic and lumbar  . Sinus bradycardia 08/06/2017   Noted on EKG  . Vitamin D deficiency    resolved    Past Surgical History:  Procedure  Laterality Date  . CARDIOVERSION  07/31/2017  . CATARACT EXTRACTION, BILATERAL    . COLON SURGERY  2009   segmental sigmoid resection  . COLONOSCOPY    . CYSTOSCOPY W/ RETROGRADES Right 06/07/2018   Procedure: CYSTOSCOPY WITH RETROGRADE PYELOGRAM, uphrostogram;  Surgeon: Cleon Gustin, MD;  Location: [facility];  Service: Urology;  Laterality: Right;  . CYSTOSCOPY WITH URETEROSCOPY AND STENT PLACEMENT Right 02/28/2018   Procedure: CYSTOSCOPY WITH URETEROSCOPY [place];  Surgeon: Kathie Rhodes, MD;  Location: [facility];  Service: Urology;  Laterality: Right;  . CYSTOSCOPY/URETEROSCOPY/HOLMIUM LASER/STENT PLACEMENT Right 11/29/2017   Procedure: CYSTOSCOPY/RETROGRADE/URETEROSCOPY/HOLMIUM LASER/STENT PLACEMENT;  Surgeon: Kathie Rhodes, MD;  Location: [facility];  Service: Urology;  Laterality: Right;  . EYE SURGERY Bilateral 01/12/2017   cataract removal  .  HOLMIUM LASER APPLICATION Right [id number]   Procedure: HOLMIUM LASER APPLICATION;  Surgeon: Kathie Rhodes, MD;  Location: [facility];  Service: Urology;  Laterality: Right;  . IR BALLOON DILATION URETERAL STRICTURE RIGHT  03/14/2018  . IR NEPHRO TUBE REMOV/FL  03/17/2018  . IR NEPHROSTOGRAM RIGHT THRU EXISTING ACCESS  03/17/2018  . IR NEPHROSTOMY EXCHANGE RIGHT  03/14/2018  . IR NEPHROSTOMY EXCHANGE RIGHT  06/21/2018  . IR NEPHROSTOMY PLACEMENT RIGHT  03/02/2018  . IR NEPHROSTOMY PLACEMENT RIGHT  04/20/2018  . IR URETERAL STENT PLACEMENT EXISTING ACCESS RIGHT  03/14/2018  . NOSE SURGERY     Submucous resection age 30  . NUCLEAR STRESS TEST  06/03/2009  . ROBOT ASSISTED LAPAROSCOPIC NEPHRECTOMY Right 08/04/2018   Procedure: XI ROBOTIC ASSISTED LAPAROSCOPIC NEPHRECTOMY;  Surgeon: Cleon Gustin, MD;  Location: [facility];  Service: Urology;  Laterality: Right;  3 HRS  . ROBOT ASSISTED PYELOPLASTY Right 06/07/2018   Procedure: attempted XI ROBOTIC ASSISTED PYELOPLASTY, lysis of adhesions;  Surgeon: Cleon Gustin, MD;  Location: [facility];  Service: Urology;  Laterality: Right;  3 HRS  . SKIN BIOPSY    . URETEROSCOPY WITH HOLMIUM LASER LITHOTRIPSY Right 09/24/2017   Procedure: CYSTOSCOPY, URETEROSCOPY WITH HOLMIUM LASER LITHOTRIPSY, STENT PLACEMENT;  Surgeon: Kathie Rhodes, MD;  Location: [facility];  Service: Urology;  Laterality: Right;     Current Outpatient Medications  Medication Sig Dispense Refill  . acetaminophen (TYLENOL) 500 MG tablet Take 1,000 mg by mouth every 6 (six) hours as needed (pain).     Marland Kitchen atenolol (TENORMIN) 25 MG tablet TAKE ONE TABLET BY MOUTH TWICE A DAY 180 tablet 1  . bisacodyl (DULCOLAX) 5 MG EC tablet Take 5 mg by mouth daily as needed for moderate constipation.    . carboxymethylcellulose (REFRESH PLUS) 0.5 % SOLN Place 1 drop into both eyes daily.    . clotrimazole-betamethasone (LOTRISONE) cream Apply 1 application topically 2 (two) times  daily as needed (yeast infection).  30 g 0  . Colchicine 0.6 MG CAPS 1 tab po bid (Patient taking differently: Take 0.6 mg by mouth 2 (two) times daily as needed (gout). 1 tab po bid) 60 capsule 0  . cyclobenzaprine (FLEXERIL) 5 MG tablet Take 1 tablet (5 mg total) by mouth 3 (three) times daily as needed for muscle spasms. 30 tablet 2  . fluconazole (DIFLUCAN) 150 MG tablet Take 1 tablet (150 mg total) by mouth 2 (two) times a week. 6 tablet 0  . FLUoxetine (PROZAC) 10 MG tablet Take 1 tablet (10 mg total) by mouth 2 (two) times a day. 60 tablet 3  . fluticasone (FLONASE) 50 MCG/ACT nasal spray Place 2 sprays into the nose  daily as needed for allergies. For congestion    . HYDROcodone-acetaminophen (NORCO) 5-325 MG tablet Take 1-2 tablets by mouth every 6 (six) hours as needed for moderate pain or severe pain. 20 tablet 0  . ketoconazole (NIZORAL) 2 % cream Apply 1 application topically daily. (Patient taking differently: Apply 1 application topically once a week. ) 60 g 1  . ketoconazole (NIZORAL) 2 % shampoo Apply 1 application topically 2 (two) times a week. 120 mL 12  . LORazepam (ATIVAN) 1 MG tablet TAKE ONE TABLET BY MOUTH TWICE A DAY AS NEEDED FOR ANXIETY OR  SEDATION 180 tablet 0  . Menthol, Topical Analgesic, (ICY HOT BACK EX) Apply 1 application topically daily as needed (back pain).    Marland Kitchen omeprazole (PRILOSEC) 20 MG capsule Take 20 mg by mouth daily as needed (acid reflux).      No current facility-administered medications for this visit.     Allergies:   Cefaclor, Cephalosporins, and Penicillins    ROS:  Please see the history of present illness.   Otherwise, review of systems are positive for none.   All other systems are reviewed and negative.    PHYSICAL EXAM: VS:  BP 126/78   Pulse (!) 59   Temp (!) 97 F (36.1 C) (Temporal)   Ht 6' (1.829 m)   Wt 215 lb (97.5 kg)   SpO2 98%   BMI 29.16 kg/m  , BMI Body mass index is 29.16 kg/m. GENERAL:  Well appearing NECK:  No  jugular venous distention, waveform within normal limits, carotid upstroke brisk and symmetric, no bruits, no thyromegaly LUNGS:  Clear to auscultation bilaterally CHEST:  Unremarkable HEART:  PMI not displaced or sustained,S1 and S2 within normal limits, no S3, no S4, no clicks, no rubs, no murmurs ABD:  Flat, positive bowel sounds normal in frequency in pitch, no bruits, no rebound, no guarding, no midline pulsatile mass, no hepatomegaly, no splenomegaly EXT:  2 plus pulses throughout, no edema, no cyanosis no clubbing   EKG:  EKG is not ordered today. Sinus rhythm, rate 59, left axis deviation, poor anterior R wave progression, no acute ST-T wave changes.  Recent Labs: 08/08/2018: ALT 15; BUN 15; Creatinine, Ser 1.75; Hemoglobin 13.2; Platelets 275.0; Potassium 4.2; Sodium 135; TSH 1.65    Lipid Panel    Component Value Date/Time   CHOL 179 08/08/2018 1526   TRIG 89.0 08/08/2018 1526   HDL 40.90 08/08/2018 1526   CHOLHDL 4 08/08/2018 1526   VLDL 17.8 08/08/2018 1526   LDLCALC 120 (H) 08/08/2018 1526   LDLCALC 179 (H) 07/01/2018 1446   LDLDIRECT 168.5 03/29/2006 1007      Wt Readings from Last 3 Encounters:  10/31/18 215 lb (97.5 kg)  09/26/18 210 lb (95.3 kg)  08/17/18 205 lb (93 kg)      Other studies Reviewed: Additional studies/ records that were reviewed today include: None Review of the above records demonstrates:  Please see elsewhere in the note.     ASSESSMENT AND PLAN:  ATRIAL FIB:    Mr. TARIAN BIBLER has a CHA2DS2 - VASc score of 2.   He is now off the Xarelto and I agree with this as he has had absolutely no symptomatic or other documented recurrence despite multiple hospital visits and acute illnesses.  I think the risk-benefit falls on the side of not using anticoagulation.   LVH:   This was moderate and I will follow this up probably in a year with an echocardiogram.  For now he is continue blood pressure control.   HTN: Blood pressure is well  controlled with his weight loss.  No change in therapy.  Current medicines are reviewed at length with the patient today.  The patient does not have concerns regarding medicines.  The following changes have been made:  None  Labs/ tests ordered today include: None  Orders Placed This Encounter  Procedures  . EKG 12-Lead     Disposition:   FU with me in 12 months.     Signed, Minus Breeding, MD  10/31/2018 5:03 PM    David [name]

## 2018-10-31 ENCOUNTER — Ambulatory Visit (INDEPENDENT_AMBULATORY_CARE_PROVIDER_SITE_OTHER): Payer: Medicare Other | Admitting: Cardiology

## 2018-10-31 ENCOUNTER — Other Ambulatory Visit: Payer: Self-pay

## 2018-10-31 ENCOUNTER — Encounter: Payer: Self-pay | Admitting: Cardiology

## 2018-10-31 VITALS — BP 126/78 | HR 59 | Temp 97.0°F | Ht 72.0 in | Wt 215.0 lb

## 2018-10-31 DIAGNOSIS — I517 Cardiomegaly: Secondary | ICD-10-CM

## 2018-10-31 DIAGNOSIS — I4819 Other persistent atrial fibrillation: Secondary | ICD-10-CM

## 2018-10-31 DIAGNOSIS — I10 Essential (primary) hypertension: Secondary | ICD-10-CM | POA: Diagnosis not present

## 2018-10-31 NOTE — Patient Instructions (Addendum)
Medication Instructions:  Your physician recommends that you continue on your current medications as directed. Please refer to the Current Medication list given to you today.  *If you need a refill on your cardiac medications before your next appointment, please call your pharmacy*  Lab Work: NONE   Testing/Procedures: NONE   Follow-Up: At [facility], you and your health needs are our priority.  As part of our continuing mission to provide you with exceptional heart care, we have created designated Provider Care Teams.  These Care Teams include your primary Cardiologist (physician) and Advanced Practice Providers (APPs -  Physician Assistants and Nurse Practitioners) who all work together to provide you with the care you need, when you need it.  Your next appointment:   12 months  You will receive a reminder letter in the mail two months in advance. If you don't receive a letter, please call our office to schedule the follow-up appointment.   The format for your next appointment:   In Person  Provider:   You may see Minus Breeding, MD or one of the following Advanced Practice Providers on your designated Care Team:    Rosaria Ferries, PA-C  Jory Sims, DNP, ANP  Cadence Kathlen Mody, NP

## 2018-11-04 ENCOUNTER — Encounter: Payer: Self-pay | Admitting: Internal Medicine

## 2018-11-04 ENCOUNTER — Telehealth: Payer: Self-pay | Admitting: Internal Medicine

## 2018-11-04 NOTE — Telephone Encounter (Signed)
Okay for a direct.  Please help schedule colon and previsit

## 2018-11-09 ENCOUNTER — Other Ambulatory Visit (INDEPENDENT_AMBULATORY_CARE_PROVIDER_SITE_OTHER): Payer: Medicare Other

## 2018-11-09 ENCOUNTER — Other Ambulatory Visit: Payer: Self-pay

## 2018-11-09 DIAGNOSIS — E782 Mixed hyperlipidemia: Secondary | ICD-10-CM

## 2018-11-09 DIAGNOSIS — I10 Essential (primary) hypertension: Secondary | ICD-10-CM | POA: Diagnosis not present

## 2018-11-09 DIAGNOSIS — R739 Hyperglycemia, unspecified: Secondary | ICD-10-CM | POA: Diagnosis not present

## 2018-11-09 LAB — CBC
HCT: 42.1 % (ref 39.0–52.0)
Hemoglobin: 14 g/dL (ref 13.0–17.0)
MCHC: 33.3 g/dL (ref 30.0–36.0)
MCV: 91.2 fl (ref 78.0–100.0)
Platelets: 235 10*3/uL (ref 150.0–400.0)
RBC: 4.62 Mil/uL (ref 4.22–5.81)
RDW: 15.1 % (ref 11.5–15.5)
WBC: 5.9 10*3/uL (ref 4.0–10.5)

## 2018-11-09 LAB — LIPID PANEL
Cholesterol: 230 mg/dL — ABNORMAL HIGH (ref 0–200)
HDL: 36.8 mg/dL — ABNORMAL LOW (ref >39.00)
LDL Cholesterol: 167 mg/dL — ABNORMAL HIGH (ref 0–99)
NonHDL: 193.69
Total CHOL/HDL Ratio: 6
Triglycerides: 135 mg/dL (ref 0.0–149.0)
VLDL: 27 mg/dL (ref 0.0–40.0)

## 2018-11-09 LAB — COMPREHENSIVE METABOLIC PANEL
ALT: 14 U/L (ref 0–53)
AST: 16 U/L (ref 0–37)
Albumin: 4.2 g/dL (ref 3.5–5.2)
Alkaline Phosphatase: 88 U/L (ref 39–117)
BUN: 16 mg/dL (ref 6–23)
CO2: 29 mEq/L (ref 19–32)
Calcium: 10.1 mg/dL (ref 8.4–10.5)
Chloride: 103 mEq/L (ref 96–112)
Creatinine, Ser: 1.76 mg/dL — ABNORMAL HIGH (ref 0.40–1.50)
GFR: 38.09 mL/min — ABNORMAL LOW (ref >60.00)
Glucose, Bld: 92 mg/dL (ref 70–99)
Potassium: 4.4 mEq/L (ref 3.5–5.1)
Sodium: 139 mEq/L (ref 135–145)
Total Bilirubin: 0.6 mg/dL (ref 0.2–1.2)
Total Protein: 6.8 g/dL (ref 6.0–8.3)

## 2018-11-09 LAB — HEMOGLOBIN A1C: Hgb A1c MFr Bld: 5.4 % (ref 4.6–6.5)

## 2018-11-10 LAB — TSH: TSH: 2.81 u[IU]/mL (ref 0.35–4.50)

## 2018-11-14 MED ORDER — ATORVASTATIN CALCIUM 10 MG PO TABS: 10.0000 mg | ORAL_TABLET | Freq: Every day | ORAL | 3 refills | Status: DC

## 2018-11-14 NOTE — Addendum Note (Signed)
Addended by: Magdalene Molly A on: 11/14/2018 03:14 PM   Modules accepted: Orders

## 2018-11-24 ENCOUNTER — Other Ambulatory Visit: Payer: Self-pay

## 2018-11-24 ENCOUNTER — Encounter: Payer: Self-pay | Admitting: Internal Medicine

## 2018-11-24 ENCOUNTER — Ambulatory Visit (AMBULATORY_SURGERY_CENTER): Payer: Self-pay

## 2018-11-24 VITALS — Temp 96.2°F | Ht 72.0 in | Wt 220.0 lb

## 2018-11-24 DIAGNOSIS — Z1211 Encounter for screening for malignant neoplasm of colon: Secondary | ICD-10-CM

## 2018-11-24 NOTE — Progress Notes (Signed)
Denies allergies to eggs or soy products. Denies complication of anesthesia or sedation. Denies use of weight loss medication. Denies use of O2.   Emmi instructions given for colonoscopy.  Covid screening is scheduled for Monday 11/28/18 @ 2:10 Pm.

## 2018-12-01 ENCOUNTER — Encounter: Payer: Medicare Other | Admitting: Internal Medicine

## 2018-12-20 ENCOUNTER — Other Ambulatory Visit: Payer: Self-pay | Admitting: Family Medicine

## 2018-12-26 ENCOUNTER — Other Ambulatory Visit: Payer: Self-pay

## 2018-12-26 DIAGNOSIS — Z20822 Contact with and (suspected) exposure to covid-19: Secondary | ICD-10-CM

## 2018-12-27 LAB — NOVEL CORONAVIRUS, NAA: SARS-CoV-2, NAA: NOT DETECTED

## 2019-01-24 ENCOUNTER — Ambulatory Visit: Payer: Medicare Other | Admitting: Family Medicine

## 2019-01-27 ENCOUNTER — Encounter: Payer: Medicare Other | Admitting: Internal Medicine

## 2019-02-03 ENCOUNTER — Ambulatory Visit (INDEPENDENT_AMBULATORY_CARE_PROVIDER_SITE_OTHER): Payer: Medicare Other | Admitting: Family Medicine

## 2019-02-03 ENCOUNTER — Other Ambulatory Visit: Payer: Self-pay

## 2019-02-03 VITALS — BP 145/80 | HR 55 | Wt 220.0 lb

## 2019-02-03 DIAGNOSIS — R7989 Other specified abnormal findings of blood chemistry: Secondary | ICD-10-CM

## 2019-02-03 DIAGNOSIS — R972 Elevated prostate specific antigen [PSA]: Secondary | ICD-10-CM | POA: Diagnosis not present

## 2019-02-03 DIAGNOSIS — L299 Pruritus, unspecified: Secondary | ICD-10-CM

## 2019-02-03 DIAGNOSIS — N289 Disorder of kidney and ureter, unspecified: Secondary | ICD-10-CM | POA: Diagnosis not present

## 2019-02-03 DIAGNOSIS — R739 Hyperglycemia, unspecified: Secondary | ICD-10-CM | POA: Diagnosis not present

## 2019-02-03 DIAGNOSIS — M1A9XX0 Chronic gout, unspecified, without tophus (tophi): Secondary | ICD-10-CM

## 2019-02-03 DIAGNOSIS — I10 Essential (primary) hypertension: Secondary | ICD-10-CM

## 2019-02-03 DIAGNOSIS — E782 Mixed hyperlipidemia: Secondary | ICD-10-CM

## 2019-02-03 DIAGNOSIS — R102 Pelvic and perineal pain: Secondary | ICD-10-CM

## 2019-02-03 NOTE — Assessment & Plan Note (Signed)
Try Witch hazel astringent and Sarna anti itch lotion

## 2019-02-03 NOTE — Assessment & Plan Note (Signed)
Hydrate and monitor 

## 2019-02-03 NOTE — Assessment & Plan Note (Signed)
Well controlled, no changes to meds. Encouraged heart healthy diet such as the DASH diet and exercise as tolerated.  °

## 2019-02-03 NOTE — Progress Notes (Signed)
Virtual Visit via Video Note  I connected with David [name] on 02/03/19 at  9:40 AM EST by a video enabled telemedicine application and verified that I am speaking with the correct person using two identifiers.  Location: Patient: home Provider: home   I discussed the limitations of evaluation and management by telemedicine and the availability of in person appointments. The patient expressed understanding and agreed to proceed. Magdalene Molly, CMA was able to get the patient set upon a visit, video   Subjective:    Patient ID: David [name], male    DOB: 1945-01-23, 74 y.o.   MRN: [id number]  Chief Complaint  Patient presents with  . Rash    HPI Patient is in today for follow up on chronic medical concerns. No recent febrile illness or hospitalizations. He is feeling much better after his prostate procedures but is noting some Erectile Dysfunction now and he never had that prior to his prostate interventions. His frequency and urgency is much beter, he does still have some pressure in his pelvis as well. He has an appt soon with Gastroenterology Dr Carlean Purl. He notes his BP has been up slightly in 130s or 140s. Denies CP/palp/SOB/HA/congestion/fevers/GI c/o. Taking meds as prescribed  Past Medical History:  Diagnosis Date  . Abnormal liver function 03/18/2013  . Adenoma of right adrenal gland 07/11/2002   2.4cm , noted on CT ABD  . Allergy   . Anxiety   . Arthritis of both knees 03/26/2016  . Astigmatism   . Atrial fibrillation ([place])    x1 successful cardioversions  . Back pain   . BCC (basal cell carcinoma of skin)    under right eye and right ear  . Blood transfusion without reported diagnosis   . Cataract 09/29/2016  . Depression   . Diverticulitis   . Diverticulosis   . Fatty liver   . GERD (gastroesophageal reflux disease)   . Gout   . Hearing loss of both ears 03/26/2016  . History of chronic prostatitis    started at age 19  . History of kidney stones   .  Hyperglycemia 03/29/2016  . Hyperlipidemia   . Hypertension   . IBS (irritable bowel syndrome) 03/18/2013  . Incomplete right bundle branch block (RBBB)   . Internal hemorrhoids   . Kidney lesion 06/07/2015   Right midportion, 1.1x1.1 cm hyperechoic, noted on [place] ABD  . LAFB (left anterior fascicular block)   . Left anterior fascicular block 08/06/2017   Noted on EKG  . Lipoma of axilla 09/2016  . Liver lesion, right lobe 06/07/2015   2.5x2.4x2.4 cm hypoechoic lesion posterior aspect, noted on [place] ABD  . Low back pain 03/26/2016  . LVH (left ventricular hypertrophy) 08/06/2017   Moderate, noted on ECHO  . LVH (left ventricular hypertrophy) 08/06/2017   Moderate, Noted on ECHO  . Medicare annual wellness visit, subsequent 03/12/2014  . Muscle cramps   . Nocturia   . OA (osteoarthritis)    Back, Hands, Neck  . Obesity 11/25/2007   Qualifier: Diagnosis of  By: Lenna Gilford MD, Deborra Medina   . Other malaise and fatigue 03/13/2013  . Penis symptom or sign    PENIS RED NO DRAINAGE   . Premature ventricular complex   . Preventative health care 03/07/2015  . Renal insufficiency    Kidney removed  . Scoliosis    Upper thoracic and lumbar  . Sinus bradycardia 08/06/2017   Noted on EKG  . Vitamin D deficiency  resolved    Past Surgical History:  Procedure Laterality Date  . CARDIOVERSION  07/31/2017  . CATARACT EXTRACTION, BILATERAL    . COLON SURGERY  2009   segmental sigmoid resection  . COLONOSCOPY    . CYSTOSCOPY W/ RETROGRADES Right 06/07/2018   Procedure: CYSTOSCOPY WITH RETROGRADE PYELOGRAM, uphrostogram;  Surgeon: Cleon Gustin, MD;  Location: [facility];  Service: Urology;  Laterality: Right;  . CYSTOSCOPY WITH URETEROSCOPY AND STENT PLACEMENT Right 02/28/2018   Procedure: CYSTOSCOPY WITH URETEROSCOPY [place] Se;  Surgeon: Kathie Rhodes, MD;  Location[facility];  Service: Urology;  Laterality: Right;  . CYSTOSCOPY/URETEROSCOPY/HOLMIUM LASER/STENT PLACEMENT Right  11/29/2017   Procedure: CYSTOSCOPY/RETROGRADE/URETEROSCOPY/HOLMIUM LASER/STENT PLACEMENT;  Surgeon: Kathie Rhodes, MD;  Location: [facility];  Service: Urology;  Laterality: Right;  . EYE SURGERY Bilateral 01/12/2017   cataract removal  . HOLMIUM LASER APPLICATION Right [id number]   Procedure: HOLMIUM LASER APPLICATION;  Surgeon: Kathie Rhodes, MD;  Location: [facility];  Service: Urology;  Laterality: Right;  . IR BALLOON DILATION URETERAL STRICTURE RIGHT  03/14/2018  . IR NEPHRO TUBE REMOV/FL  03/17/2018  . IR NEPHROSTOGRAM RIGHT THRU EXISTING ACCESS  03/17/2018  . IR NEPHROSTOMY EXCHANGE RIGHT  03/14/2018  . IR NEPHROSTOMY EXCHANGE RIGHT  06/21/2018  . IR NEPHROSTOMY PLACEMENT RIGHT  03/02/2018  . IR NEPHROSTOMY PLACEMENT RIGHT  04/20/2018  . IR URETERAL STENT PLACEMENT EXISTING ACCESS RIGHT  03/14/2018  . NOSE SURGERY     Submucous resection age 63  . NUCLEAR STRESS TEST  06/03/2009  . ROBOT ASSISTED LAPAROSCOPIC NEPHRECTOMY Right 08/04/2018   Procedure: XI ROBOTIC ASSISTED LAPAROSCOPIC NEPHRECTOMY;  Surgeon: Cleon Gustin, MD;  Location: [facility];  Service: Urology;  Laterality: Right;  3 HRS  . ROBOT ASSISTED PYELOPLASTY Right 06/07/2018   Procedure: attempted XI ROBOTIC ASSISTED PYELOPLASTY, lysis of adhesions;  Surgeon: Cleon Gustin, MD;  Location: [facility];  Service: Urology;  Laterality: Right;  3 HRS  . SKIN BIOPSY    . URETEROSCOPY WITH HOLMIUM LASER LITHOTRIPSY Right 09/24/2017   Procedure: CYSTOSCOPY, URETEROSCOPY WITH HOLMIUM LASER LITHOTRIPSY, STENT PLACEMENT;  Surgeon: Kathie Rhodes, MD;  Location: [facility];  Service: Urology;  Laterality: Right;    Family History  Problem Relation Age of Onset  . Heart disease Mother   . Hypertension Mother   . Stroke Mother   . Colon cancer Mother   . Breast cancer Mother   . Heart disease Father        pacemaker  . Aortic aneurysm Father   . Hypertension Father   . Heart disease Sister   . Atrial  fibrillation Sister   . Obesity Sister   . Sleep apnea Sister   . Heart attack Brother   . Other Brother        muscle disease  . Arthritis Brother   . Stroke Brother   . Atrial fibrillation Brother   . Cancer Maternal Grandmother        ?  Marland Kitchen Heart attack Maternal Grandmother   . Diabetes Maternal Grandmother   . Cancer Maternal Grandfather        hodgin's lymphoma  . Heart attack Paternal Grandmother   . Anxiety disorder Paternal Grandmother   . Pneumonia Paternal Grandfather   . Heart attack Brother   . Atrial fibrillation Brother   . Atrial fibrillation Brother   . Heart attack Brother   . Hypertension Brother   . Hyperlipidemia Brother   . Heart attack Brother   .  Other Brother        heart valve operation  . Atrial fibrillation Brother   . Esophageal cancer Neg Hx   . Rectal cancer Neg Hx   . Stomach cancer Neg Hx     Social History   Socioeconomic History  . Marital status: Married    Spouse name: Not on file  . Number of children: 3  . Years of education: Not on file  . Highest education level: Not on file  Occupational History  . 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 Smoking status: Former Smoker    Packs/day: 1.00    Years: 6.00    Pack years: 6.00    Types: Cigarettes    Start date: 01/12/1969  . Smokeless tobacco: Never Used  . Tobacco comment: [id number]  Substance and Sexual Activity  . Alcohol use: Not Currently    Comment: NONE SINCE July 18 2017  . Drug use: No  . Sexual activity: Yes  Other Topics Concern  . Not on file  Social History Narrative   The patient is married for the second time, he has 3 sons.   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   2 alcoholic beverages most days.   1 caffeinated beverage daily   No drug use no current tobacco use he is a prior smoker   12/24/2016   Social Determinants of Health   Financial Resource Strain:   . Difficulty of Paying Living Expenses: Not on file  Food Insecurity:   .  Worried About Charity fundraiser in the Last Year: Not on file  . Ran Out of Food in the Last Year: Not on file  Transportation Needs:   . Lack of Transportation (Medical): Not on file  . Lack of Transportation (Non-Medical): Not on file  Physical Activity:   . Days of Exercise per Week: Not on file  . Minutes of Exercise per Session: Not on file  Stress:   . Feeling of Stress : Not on file  Social Connections:   . Frequency of Communication with Friends and Family: Not on file  . Frequency of Social Gatherings with Friends and Family: Not on file  . Attends Religious Services: Not on file  . Active Member of Clubs or Organizations: Not on file  . Attends Archivist Meetings: Not on file  . Marital Status: Not on file  Intimate Partner Violence:   . Fear of Current or Ex-Partner: Not on file  . Emotionally Abused: Not on file  . Physically Abused: Not on file  . Sexually Abused: Not on file    Outpatient Medications Prior to Visit  Medication Sig Dispense Refill  . acetaminophen (TYLENOL) 500 MG tablet Take 1,000 mg by mouth every 6 (six) hours as needed (pain).     Marland Kitchen atenolol (TENORMIN) 25 MG tablet TAKE ONE TABLET BY MOUTH TWICE A DAY 180 tablet 1  . atorvastatin (LIPITOR) 10 MG tablet Take 1 tablet (10 mg total) by mouth daily. 90 tablet 3  . bisacodyl (DULCOLAX) 5 MG EC tablet Take 5 mg by mouth daily as needed for moderate constipation.    . carboxymethylcellulose (REFRESH PLUS) 0.5 % SOLN Place 1 drop into both eyes daily.    . clotrimazole-betamethasone (LOTRISONE) cream Apply 1 application topically 2 (two) times daily as needed (yeast infection).  30 g 0  . Colchicine 0.6 MG CAPS 1 tab po bid (Patient taking differently: Take 0.6 mg by  mouth 2 (two) times daily as needed (gout). 1 tab po bid) 60 capsule 0  . cyclobenzaprine (FLEXERIL) 5 MG tablet Take 1 tablet (5 mg total) by mouth 3 (three) times daily as needed for muscle spasms. 30 tablet 2  . fluconazole  (DIFLUCAN) 150 MG tablet Take 1 tablet (150 mg total) by mouth 2 (two) times a week. 6 tablet 0  . FLUoxetine (PROZAC) 10 MG tablet TAKE ONE TABLET BY MOUTH TWICE A DAY 60 tablet 2  . fluticasone (FLONASE) 50 MCG/ACT nasal spray Place 2 sprays into the nose daily as needed for allergies. For congestion    . HYDROcodone-acetaminophen (NORCO) 5-325 MG tablet Take 1-2 tablets by mouth every 6 (six) hours as needed for moderate pain or severe pain. 20 tablet 0  . ketoconazole (NIZORAL) 2 % cream Apply 1 application topically daily. (Patient taking differently: Apply 1 application topically once a week. ) 60 g 1  . ketoconazole (NIZORAL) 2 % shampoo Apply 1 application topically 2 (two) times a week. 120 mL 12  . LORazepam (ATIVAN) 1 MG tablet TAKE ONE TABLET BY MOUTH TWICE A DAY AS NEEDED FOR ANXIETY OR  SEDATION 180 tablet 0  . Menthol, Topical Analgesic, (ICY HOT BACK EX) Apply 1 application topically daily as needed (back pain).    Marland Kitchen omeprazole (PRILOSEC) 20 MG capsule Take 20 mg by mouth daily as needed (acid reflux).     . polyethylene glycol powder (GLYCOLAX/MIRALAX) 17 GM/SCOOP powder Take 1 Container by mouth once.     No facility-administered medications prior to visit.    Allergies  Allergen Reactions  . Cefaclor Hives  . Cephalosporins Hives  . Penicillins Rash    Mild maculopapular rash Has patient had a PCN reaction causing immediate rash, facial/tongue/throat swelling, SOB or lightheadedness with hypotension: No Has patient had a PCN reaction causing severe rash involving mucus membranes or skin necrosis: No Has patient had a PCN reaction that required hospitalization: No Has patient had a PCN reaction occurring within the last 10 years: No If all of the above answers are "NO", then may proceed with Cephalosporin use.     Review of Systems  Constitutional: Negative for fever and malaise/fatigue.  HENT: Negative for congestion.   Eyes: Negative for blurred vision.   Respiratory: Negative for shortness of breath.   Cardiovascular: Negative for chest pain, palpitations and leg swelling.  Gastrointestinal: Negative for abdominal pain, blood in stool and nausea.  Genitourinary: Negative for dysuria and frequency.  Musculoskeletal: Negative for falls.  Skin: Positive for itching and rash.  Neurological: Negative for dizziness, loss of consciousness and headaches.  Endo/Heme/Allergies: Negative for environmental allergies.  Psychiatric/Behavioral: Negative for depression. The patient is not nervous/anxious.        Objective:    Physical Exam Constitutional:      Appearance: Normal appearance. He is not ill-appearing.  HENT:     Head: Normocephalic and atraumatic.     Nose: Nose normal.  Eyes:     General:        Right eye: No discharge.        Left eye: No discharge.  Pulmonary:     Effort: Pulmonary effort is normal.  Neurological:     Mental Status: He is alert and oriented to person, place, and time.  Psychiatric:        Behavior: Behavior normal.     BP (!) 145/80 (BP Location: Left Arm, Patient Position: Sitting, Cuff Size: Normal)   Pulse (!) 55  Wt 220 lb (99.8 kg)   BMI 29.84 kg/m  Wt Readings from Last 3 Encounters:  02/03/19 220 lb (99.8 kg)  11/24/18 220 lb (99.8 kg)  10/31/18 215 lb (97.5 kg)    Diabetic Foot Exam - Simple   No data filed     Lab Results  Component Value Date   WBC 5.9 11/09/2018   HGB 14.0 11/09/2018   HCT 42.1 11/09/2018   PLT 235.0 11/09/2018   GLUCOSE 92 11/09/2018   CHOL 230 (H) 11/09/2018   TRIG 135.0 11/09/2018   HDL 36.80 (L) 11/09/2018   LDLDIRECT 168.5 03/29/2006   LDLCALC 167 (H) 11/09/2018   ALT 14 11/09/2018   AST 16 11/09/2018   NA 139 11/09/2018   K 4.4 11/09/2018   CL 103 11/09/2018   CREATININE 1.76 (H) 11/09/2018   BUN 16 11/09/2018   CO2 29 11/09/2018   TSH 2.81 11/09/2018   PSA 4.4 (H) 07/01/2018   INR 1.0 04/20/2018   HGBA1C 5.4 11/09/2018    Lab Results   Component Value Date   TSH 2.81 11/09/2018   Lab Results  Component Value Date   WBC 5.9 11/09/2018   HGB 14.0 11/09/2018   HCT 42.1 11/09/2018   MCV 91.2 11/09/2018   PLT 235.0 11/09/2018   Lab Results  Component Value Date   NA 139 11/09/2018   K 4.4 11/09/2018   CO2 29 11/09/2018   GLUCOSE 92 11/09/2018   BUN 16 11/09/2018   CREATININE 1.76 (H) 11/09/2018   BILITOT 0.6 11/09/2018   ALKPHOS 88 11/09/2018   AST 16 11/09/2018   ALT 14 11/09/2018   PROT 6.8 11/09/2018   ALBUMIN 4.2 11/09/2018   CALCIUM 10.1 11/09/2018   ANIONGAP 11 08/05/2018   GFR 38.09 (L) 11/09/2018   Lab Results  Component Value Date   CHOL 230 (H) 11/09/2018   Lab Results  Component Value Date   HDL 36.80 (L) 11/09/2018   Lab Results  Component Value Date   LDLCALC 167 (H) 11/09/2018   Lab Results  Component Value Date   TRIG 135.0 11/09/2018   Lab Results  Component Value Date   CHOLHDL 6 11/09/2018   Lab Results  Component Value Date   HGBA1C 5.4 11/09/2018       Assessment & Plan:   Problem List Items Addressed This Visit    Hyperlipidemia, mixed    Tolerating statin, encouraged heart healthy diet, avoid trans fats, minimize simple carbs and saturated fats. Increase exercise as tolerated.      Relevant Orders   Lipid panel   TSH   Essential hypertension, benign    Well controlled, no changes to meds. Encouraged heart healthy diet such as the DASH diet and exercise as tolerated.       Relevant Orders   CBC with Differential/Platelet   Comprehensive metabolic panel   TSH   Gout    Hydrate and monitor uric acid       Relevant Orders   Uric acid   Renal insufficiency    Hydrate and monitor       Hyperglycemia - Primary    hgba1c acceptable, minimize simple carbs. Increase exercise as tolerated.       Relevant Orders   Hemoglobin A1c   TSH   Abnormal TSH    Asymptomatic, repeat lab work      Hypercalcemia   Relevant Orders   VITAMIN D 25 Hydroxy (Vit-D  Deficiency, Fractures)    Other Visit Diagnoses  Elevated PSA       Relevant Orders   CBC with Differential/Platelet   PSA   Pelvic pain       Relevant Orders   VITAMIN D 25 Hydroxy (Vit-D Deficiency, Fractures)      I am having David [name] "Fred" maintain his fluticasone, carboxymethylcellulose, (Menthol, Topical Analgesic, (ICY HOT BACK EX)), omeprazole, acetaminophen, bisacodyl, Lotrisone, cyclobenzaprine, Colchicine, ketoconazole, HYDROcodone-acetaminophen, atenolol, LORazepam, ketoconazole, fluconazole, atorvastatin, polyethylene glycol powder, and FLUoxetine.  No orders of the defined types were placed in this encounter.    I discussed the assessment and treatment plan with the patient. The patient was provided an opportunity to ask questions and all were answered. The patient agreed with the plan and demonstrated an understanding of the instructions.   The patient was advised to call back or seek an in-person evaluation if the symptoms worsen or if the condition fails to improve as anticipated.  I provided 25 minutes of non-face-to-face time during this encounter.   Penni Homans, MD

## 2019-02-03 NOTE — Assessment & Plan Note (Signed)
Asymptomatic, repeat lab work

## 2019-02-03 NOTE — Assessment & Plan Note (Signed)
Hydrate and monitor uric acid

## 2019-02-03 NOTE — Assessment & Plan Note (Signed)
hgba1c acceptable, minimize simple carbs. Increase exercise as tolerated.  

## 2019-02-03 NOTE — Assessment & Plan Note (Signed)
Tolerating statin, encouraged heart healthy diet, avoid trans fats, minimize simple carbs and saturated fats. Increase exercise as tolerated 

## 2019-02-07 ENCOUNTER — Ambulatory Visit: Payer: Medicare Other | Admitting: *Deleted

## 2019-02-09 NOTE — Progress Notes (Signed)
Virtual Visit via Audio Note  I connected with patient on 02/10/19 at  8:00 AM EST by audio enabled telemedicine application and verified that I am speaking with the correct person using two identifiers.   THIS ENCOUNTER IS A VIRTUAL VISIT DUE TO COVID-19 - PATIENT WAS NOT SEEN IN THE OFFICE. PATIENT HAS CONSENTED TO VIRTUAL VISIT / TELEMEDICINE VISIT   Location of patient: home  Location of provider: office  I discussed the limitations of evaluation and management by telemedicine and the availability of in person appointments. The patient expressed understanding and agreed to proceed.   Subjective:   RAKAN LIMES is a 74 y.o. male who presents for Medicare Annual/Subsequent preventive examination.  Pt is retired Optometrist.  Review of Systems: Home Safety/Smoke Alarms: Feels safe in home. Smoke alarms in place.  Lives in 3 story home w/ wife. Reports he has no issues w/ stairs. Step over tub w/ grab bar.   Male:   CCS- scheduled 02/24/19 PSA-  Lab Results  Component Value Date   PSA 4.4 (H) 07/01/2018   PSA 3.36 11/15/2017   PSA 3.16 03/30/2017       Objective:    Vitals: BP 133/75 Comment: pt reported  Pulse 60   There is no height or weight on file to calculate BMI.  Advanced Directives 02/10/2019 08/04/2018 08/03/2018 07/06/2018 06/07/2018 06/07/2018 06/01/2018  Does Patient Have a Medical Advance Directive? No No No No - No No  Would patient like information on creating a medical advance directive? No - Patient declined No - Patient declined No - Patient declined No - Patient declined No - Patient declined No - Patient declined No - Patient declined    Tobacco Social History   Tobacco Use  Smoking Status Former Smoker  . Packs/day: 1.00  . Years: 6.00  . Pack years: 6.00  . Types: Cigarettes  . Start date: 01/12/1969  Smokeless Tobacco Never Used  Tobacco Comment   1965-1971     Counseling given: Not Answered Comment: 1965-1971   Clinical Intake: Pain :  No/denies pain     Past Medical History:  Diagnosis Date  . Abnormal liver function 03/18/2013  . Adenoma of right adrenal gland 07/11/2002   2.4cm , noted on CT ABD  . Allergy   . Anxiety   . Arthritis of both knees 03/26/2016  . Astigmatism   . Atrial fibrillation ([place])    x1 successful cardioversions  . Back pain   . BCC (basal cell carcinoma of skin)    under right eye and right ear  . Blood transfusion without reported diagnosis   . Cataract 09/29/2016  . Depression   . Diverticulitis   . Diverticulosis   . Fatty liver   . GERD (gastroesophageal reflux disease)   . Gout   . Hearing loss of both ears 03/26/2016  . History of chronic prostatitis    started at age 63  . History of kidney stones   . Hyperglycemia 03/29/2016  . Hyperlipidemia   . Hypertension   . IBS (irritable bowel syndrome) 03/18/2013  . Incomplete right bundle branch block (RBBB)   . Internal hemorrhoids   . Kidney lesion 06/07/2015   Right midportion, 1.1x1.1 cm hyperechoic, noted on [place] ABD  . LAFB (left anterior fascicular block)   . Left anterior fascicular block 08/06/2017   Noted on EKG  . Lipoma of axilla 09/2016  . Liver lesion, right lobe 06/07/2015   2.5x2.4x2.4 cm hypoechoic lesion posterior aspect, noted on  [place] ABD  . Low back pain 03/26/2016  . LVH (left ventricular hypertrophy) 08/06/2017   Moderate, noted on ECHO  . LVH (left ventricular hypertrophy) 08/06/2017   Moderate, Noted on ECHO  . Medicare annual wellness visit, subsequent 03/12/2014  . Muscle cramps   . Nocturia   . OA (osteoarthritis)    Back, Hands, Neck  . Obesity 11/25/2007   Qualifier: Diagnosis of  By: Lenna Gilford MD, Deborra Medina   . Other malaise and fatigue 03/13/2013  . Penis symptom or sign    PENIS RED NO DRAINAGE   . Premature ventricular complex   . Preventative health care 03/07/2015  . Renal insufficiency    Kidney removed  . Scoliosis    Upper thoracic and lumbar  . Sinus bradycardia 08/06/2017   Noted on EKG  .  Vitamin D deficiency    resolved   Past Surgical History:  Procedure Laterality Date  . CARDIOVERSION  07/31/2017  . CATARACT EXTRACTION, BILATERAL    . COLON SURGERY  2009   segmental sigmoid resection  . COLONOSCOPY    . CYSTOSCOPY W/ RETROGRADES Right 06/07/2018   Procedure: CYSTOSCOPY WITH RETROGRADE PYELOGRAM, uphrostogram;  Surgeon: Cleon Gustin, MD;  Location: [facility];  Service: Urology;  Laterality: Right;  . CYSTOSCOPY WITH URETEROSCOPY AND STENT PLACEMENT Right 02/28/2018   Procedure: CYSTOSCOPY WITH URETEROSCOPY [place] Se;  Surgeon: Kathie Rhodes, MD;  Location: [facility];  Service: Urology;  Laterality: Right;  . CYSTOSCOPY/URETEROSCOPY/HOLMIUM LASER/STENT PLACEMENT Right 11/29/2017   Procedure: CYSTOSCOPY/RETROGRADE/URETEROSCOPY/HOLMIUM LASER/STENT PLACEMENT;  Surgeon: Kathie Rhodes, MD;  Location: [facility];  Service: Urology;  Laterality: Right;  . EYE SURGERY Bilateral 01/12/2017   cataract removal  . HOLMIUM LASER APPLICATION Right [id number]   Procedure: HOLMIUM LASER APPLICATION;  Surgeon: Kathie Rhodes, MD;  Location[facility];  Service: Urology;  Laterality: Right;  . IR BALLOON DILATION URETERAL STRICTURE RIGHT  03/14/2018  . IR NEPHRO TUBE REMOV/FL  03/17/2018  . IR NEPHROSTOGRAM RIGHT THRU EXISTING ACCESS  03/17/2018  . IR NEPHROSTOMY EXCHANGE RIGHT  03/14/2018  . IR NEPHROSTOMY EXCHANGE RIGHT  06/21/2018  . IR NEPHROSTOMY PLACEMENT RIGHT  03/02/2018  . IR NEPHROSTOMY PLACEMENT RIGHT  04/20/2018  . IR URETERAL STENT PLACEMENT EXISTING ACCESS RIGHT  03/14/2018  . NOSE SURGERY     Submucous resection age 70  . NUCLEAR STRESS TEST  06/03/2009  . ROBOT ASSISTED LAPAROSCOPIC NEPHRECTOMY Right 08/04/2018   Procedure: XI ROBOTIC ASSISTED LAPAROSCOPIC NEPHRECTOMY;  Surgeon: Cleon Gustin, MD;  Location: [facility];  Service: Urology;  Laterality: Right;  3 HRS  . ROBOT ASSISTED PYELOPLASTY Right 06/07/2018   Procedure: attempted XI ROBOTIC  ASSISTED PYELOPLASTY, lysis of adhesions;  Surgeon: Cleon Gustin, MD;  Location: [facility];  Service: Urology;  Laterality: Right;  3 HRS  . SKIN BIOPSY    . URETEROSCOPY WITH HOLMIUM LASER LITHOTRIPSY Right 09/24/2017   Procedure: CYSTOSCOPY, URETEROSCOPY WITH HOLMIUM LASER LITHOTRIPSY, STENT PLACEMENT;  Surgeon: Kathie Rhodes, MD;  Location: [facility];  Service: Urology;  Laterality: Right;   Family History  Problem Relation Age of Onset  . Heart disease Mother   . Hypertension Mother   . Stroke Mother   . Colon cancer Mother   . Breast cancer Mother   . Heart disease Father        pacemaker  . Aortic aneurysm Father   . Hypertension Father   . Heart disease Sister   . Atrial fibrillation Sister   .  Obesity Sister   . Sleep apnea Sister   . Heart attack Brother   . Other Brother        muscle disease  . Arthritis Brother   . Stroke Brother   . Atrial fibrillation Brother   . Cancer Maternal Grandmother        ?  Marland Kitchen Heart attack Maternal Grandmother   . Diabetes Maternal Grandmother   . Cancer Maternal Grandfather        hodgin's lymphoma  . Heart attack Paternal Grandmother   . Anxiety disorder Paternal Grandmother   . Pneumonia Paternal Grandfather   . Heart attack Brother   . Atrial fibrillation Brother   . Atrial fibrillation Brother   . Heart attack Brother   . Hypertension Brother   . Hyperlipidemia Brother   . Heart attack Brother   . Other Brother        heart valve operation  . Atrial fibrillation Brother   . Esophageal cancer Neg Hx   . Rectal cancer Neg Hx   . Stomach cancer Neg Hx    Social History   Socioeconomic History  . Marital status: Married    Spouse name: Not on file  . Number of children: 3  . Years of education: Not on file  . Highest education level: Not on file  Occupational History  . 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 Smoking status: Former Smoker    Packs/day: 1.00    Years: 6.00    Pack  years: 6.00    Types: Cigarettes    Start date: 01/12/1969  . Smokeless tobacco: Never Used  . Tobacco comment: [id number]  Substance and Sexual Activity  . Alcohol use: Not Currently    Comment: NONE SINCE July 18 2017  . Drug use: No  . Sexual activity: Yes  Other Topics Concern  . Not on file  Social History Narrative   The patient is married for the second time, he has 3 sons.   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   2 alcoholic beverages most days.   1 caffeinated beverage daily   No drug use no current tobacco use he is a prior smoker   12/24/2016   Social Determinants of Health   Financial Resource Strain:   . Difficulty of Paying Living Expenses: Not on file  Food Insecurity:   . Worried About Charity fundraiser in the Last Year: Not on file  . Ran Out of Food in the Last Year: Not on file  Transportation Needs:   . Lack of Transportation (Medical): Not on file  . Lack of Transportation (Non-Medical): Not on file  Physical Activity:   . Days of Exercise per Week: Not on file  . Minutes of Exercise per Session: Not on file  Stress:   . Feeling of Stress : Not on file  Social Connections:   . Frequency of Communication with Friends and Family: Not on file  . Frequency of Social Gatherings with Friends and Family: Not on file  . Attends Religious Services: Not on file  . Active Member of Clubs or Organizations: Not on file  . Attends Archivist Meetings: Not on file  . Marital Status: Not on file    Outpatient Encounter Medications as of 02/10/2019  Medication Sig  . acetaminophen (TYLENOL) 500 MG tablet Take 1,000 mg by mouth every 6 (six) hours as needed (pain).   Marland Kitchen atenolol (TENORMIN) 25 MG tablet  TAKE ONE TABLET BY MOUTH TWICE A DAY  . atorvastatin (LIPITOR) 10 MG tablet Take 1 tablet (10 mg total) by mouth daily.  . bisacodyl (DULCOLAX) 5 MG EC tablet Take 5 mg by mouth daily as needed for moderate constipation.  . carboxymethylcellulose (REFRESH  PLUS) 0.5 % SOLN Place 1 drop into both eyes daily.  . clotrimazole-betamethasone (LOTRISONE) cream Apply 1 application topically 2 (two) times daily as needed (yeast infection).   . Colchicine 0.6 MG CAPS 1 tab po bid (Patient taking differently: Take 0.6 mg by mouth 2 (two) times daily as needed (gout). 1 tab po bid)  . cyclobenzaprine (FLEXERIL) 5 MG tablet Take 1 tablet (5 mg total) by mouth 3 (three) times daily as needed for muscle spasms.  . fluconazole (DIFLUCAN) 150 MG tablet Take 1 tablet (150 mg total) by mouth 2 (two) times a week.  Marland Kitchen FLUoxetine (PROZAC) 10 MG tablet TAKE ONE TABLET BY MOUTH TWICE A DAY  . fluticasone (FLONASE) 50 MCG/ACT nasal spray Place 2 sprays into the nose daily as needed for allergies. For congestion  . HYDROcodone-acetaminophen (NORCO) 5-325 MG tablet Take 1-2 tablets by mouth every 6 (six) hours as needed for moderate pain or severe pain.  Marland Kitchen ketoconazole (NIZORAL) 2 % cream Apply 1 application topically daily. (Patient taking differently: Apply 1 application topically once a week. )  . ketoconazole (NIZORAL) 2 % shampoo Apply 1 application topically 2 (two) times a week.  Marland Kitchen LORazepam (ATIVAN) 1 MG tablet TAKE ONE TABLET BY MOUTH TWICE A DAY AS NEEDED FOR ANXIETY OR  SEDATION  . Menthol, Topical Analgesic, (ICY HOT BACK EX) Apply 1 application topically daily as needed (back pain).  Marland Kitchen omeprazole (PRILOSEC) 20 MG capsule Take 20 mg by mouth daily as needed (acid reflux).   . polyethylene glycol powder (GLYCOLAX/MIRALAX) 17 GM/SCOOP powder Take 1 Container by mouth once.   No facility-administered encounter medications on file as of 02/10/2019.    Activities of Daily Living In your present state of health, do you have any difficulty performing the following activities: 02/10/2019 08/04/2018  Hearing? N Y  Sunflower? N N  Difficulty concentrating or making decisions? N N  Walking or climbing stairs? N N  Dressing or bathing? N N  Doing errands,  shopping? N N  Preparing Food and eating ? N -  Using the Toilet? N -  In the past six months, have you accidently leaked urine? N -  Do you have problems with loss of bowel control? N -  Managing your Medications? N -  Managing your Finances? N -  Housekeeping or managing your Housekeeping? N -  Some recent data might be hidden    Patient Care Team: Mosie Lukes, MD as PCP - General (Family Medicine) Minus Breeding, MD as PCP - Cardiology (Cardiology) Linward Natal, MD as Consulting Physician (Ophthalmology) Danella Sensing, MD as Consulting Physician (Dermatology)   .   This is a routine wellness examination for [place]. Physical assessment deferred to PCP.  Exercise Activities and Dietary recommendations Current Exercise Habits: Home exercise routine, Type of exercise: walking, Time (Minutes): 10, Frequency (Times/Week): 5, Weekly Exercise (Minutes/Week): 50, Intensity: Mild, Exercise limited by: None identified   Diet (meal preparation, eat out, water intake, caffeinated beverages, dairy products, fruits and vegetables): well balanced      Goals    . DIET - INCREASE WATER INTAKE    . Increase physical activity       Fall  Risk Fall Risk  02/10/2019 02/03/2018 10/08/2017 09/29/2016 07/15/2015  Falls in the past year? 0 0 No No No  Number falls in past yr: 0 - - - -  Injury with Fall? 0 - - - -  Comment - - - - -  Risk for fall due to : - - - - -  Follow up Education provided;Falls prevention discussed - - - -   Depression Screen PHQ 2/9 Scores 02/10/2019 02/03/2018 10/08/2017 03/30/2017  PHQ - 2 Score 0 0 0 0  Exception Documentation - - - -    Cognitive Function Ad8 score reviewed for issues:  Issues making decisions:no  Less interest in hobbies / activities:no  Repeats questions, stories (family complaining):no  Trouble using ordinary gadgets (microwave, computer, phone):no  Forgets the month or year: no  Mismanaging finances: no  Remembering  appts:no  Daily problems with thinking and/or memory:no Ad8 score is=0       Immunization History  Administered Date(s) Administered  . Influenza Split 10/02/2015  . Influenza, High Dose Seasonal PF 09/29/2016, 11/15/2017, 09/07/2018  . Influenza,inj,Quad PF,6+ Mos 09/14/2012, 09/25/2013, 03/07/2015  . Pneumococcal Conjugate-13 09/05/2012  . Pneumococcal Polysaccharide-23 08/30/2014  . Tdap 09/05/2012   Screening Tests Health Maintenance  Topic Date Due  . COLONOSCOPY  09/08/2017  . TETANUS/TDAP  09/06/2022  . INFLUENZA VACCINE  Completed  . Hepatitis C Screening  Completed  . PNA vac Low Risk Adult  Completed       Plan:   See you next year!  Continue to eat heart healthy diet (full of fruits, vegetables, whole grains, lean protein, water--limit salt, fat, and sugar intake) and increase physical activity as tolerated.  Continue doing brain stimulating activities (puzzles, reading, adult coloring books, staying active) to keep memory sharp.   Bring a copy of your living will and/or healthcare power of attorney to your next office visit.    I have personally reviewed and noted the following in the patient's chart:   . Medical and social history . Use of alcohol, tobacco or illicit drugs  . Current medications and supplements . Functional ability and status . Nutritional status . Physical activity . Advanced directives . List of other physicians . Hospitalizations, surgeries, and ER visits in previous 12 months . Vitals . Screenings to include cognitive, depression, and falls . Referrals and appointments  In addition, I have reviewed and discussed with patient certain preventive protocols, quality metrics, and best practice recommendations. A written personalized care plan for preventive services as well as general preventive health recommendations were provided to patient.     Shela Nevin, [place]  02/10/2019

## 2019-02-10 ENCOUNTER — Other Ambulatory Visit: Payer: Self-pay

## 2019-02-10 ENCOUNTER — Ambulatory Visit (INDEPENDENT_AMBULATORY_CARE_PROVIDER_SITE_OTHER): Payer: Medicare Other | Admitting: *Deleted

## 2019-02-10 ENCOUNTER — Encounter: Payer: Self-pay | Admitting: *Deleted

## 2019-02-10 VITALS — BP 133/75 | HR 60

## 2019-02-10 DIAGNOSIS — Z0000 Encounter for general adult medical examination without abnormal findings: Secondary | ICD-10-CM | POA: Diagnosis not present

## 2019-02-10 NOTE — Patient Instructions (Signed)
See you next year!  Continue to eat heart healthy diet (full of fruits, vegetables, whole grains, lean protein, water--limit salt, fat, and sugar intake) and increase physical activity as tolerated.  Continue doing brain stimulating activities (puzzles, reading, adult coloring books, staying active) to keep memory sharp.   Bring a copy of your living will and/or healthcare power of attorney to your next office visit.   [name] , Thank you for taking time to come for your Medicare Wellness Visit. I appreciate your ongoing commitment to your health goals. Please review the following plan we discussed and let me know if I can assist you in the future.   These are the goals we discussed: Goals    . DIET - INCREASE WATER INTAKE    . Increase physical activity       This is a list of the screening recommended for you and due dates:  Health Maintenance  Topic Date Due  . Colon Cancer Screening  09/08/2017  . Tetanus Vaccine  09/06/2022  . Flu Shot  Completed  .  Hepatitis C: One time screening is recommended by Center for Disease Control  (CDC) for  adults born from [birth year] through 1965.   Completed  . Pneumonia vaccines  Completed    Preventive Care 9 Years and Older, Male Preventive care refers to lifestyle choices and visits with your health care provider that can promote health and wellness. This includes:  A yearly physical exam. This is also called an annual well check.  Regular dental and eye exams.  Immunizations.  Screening for certain conditions.  Healthy lifestyle choices, such as diet and exercise. What can I expect for my preventive care visit? Physical exam Your health care provider will check:  Height and weight. These may be used to calculate body mass index (BMI), which is a measurement that tells if you are at a healthy weight.  Heart rate and blood pressure.  Your skin for abnormal spots. Counseling Your health care provider may ask you questions  about:  Alcohol, tobacco, and drug use.  Emotional well-being.  Home and relationship well-being.  Sexual activity.  Eating habits.  History of falls.  Memory and ability to understand (cognition).  Work and work Statistician. What immunizations do I need?  Influenza (flu) vaccine  This is recommended every year. Tetanus, diphtheria, and pertussis (Tdap) vaccine  You may need a Td booster every 10 years. Varicella (chickenpox) vaccine  You may need this vaccine if you have not already been vaccinated. Zoster (shingles) vaccine  You may need this after age 41. Pneumococcal conjugate (PCV13) vaccine  One dose is recommended after age 28. Pneumococcal polysaccharide (PPSV23) vaccine  One dose is recommended after age 4. Measles, mumps, and rubella (MMR) vaccine  You may need at least one dose of MMR if you were born in 1957 or later. You may also need a second dose. Meningococcal conjugate (MenACWY) vaccine  You may need this if you have certain conditions. Hepatitis A vaccine  You may need this if you have certain conditions or if you travel or work in places where you may be exposed to hepatitis A. Hepatitis B vaccine  You may need this if you have certain conditions or if you travel or work in places where you may be exposed to hepatitis B. Haemophilus influenzae type b (Hib) vaccine  You may need this if you have certain conditions. You may receive vaccines as individual doses or as more than one vaccine together  in one shot (combination vaccines). Talk with your health care provider about the risks and benefits of combination vaccines. What tests do I need? Blood tests  Lipid and cholesterol levels. These may be checked every 5 years, or more frequently depending on your overall health.  Hepatitis C test.  Hepatitis B test. Screening  Lung cancer screening. You may have this screening every year starting at age 71 if you have a 30-pack-year history of  smoking and currently smoke or have quit within the past 15 years.  Colorectal cancer screening. All adults should have this screening starting at age 25 and continuing until age 96. Your health care provider may recommend screening at age 33 if you are at increased risk. You will have tests every 1-10 years, depending on your results and the type of screening test.  Prostate cancer screening. Recommendations will vary depending on your family history and other risks.  Diabetes screening. This is done by checking your blood sugar (glucose) after you have not eaten for a while (fasting). You may have this done every 1-3 years.  Abdominal aortic aneurysm (AAA) screening. You may need this if you are a current or former smoker.  Sexually transmitted disease (STD) testing. Follow these instructions at home: Eating and drinking  Eat a diet that includes fresh fruits and vegetables, whole grains, lean protein, and low-fat dairy products. Limit your intake of foods with high amounts of sugar, saturated fats, and salt.  Take vitamin and mineral supplements as recommended by your health care provider.  Do not drink alcohol if your health care provider tells you not to drink.  If you drink alcohol: ? Limit how much you have to 0-2 drinks a day. ? Be aware of how much alcohol is in your drink. In the [place] one drink equals one 12 oz bottle of beer (355 mL), one 5 oz glass of wine (148 mL), or one 1 oz glass of hard liquor (44 mL). Lifestyle  Take daily care of your teeth and gums.  Stay active. Exercise for at least 30 minutes on 5 or more days each week.  Do not use any products that contain nicotine or tobacco, such as cigarettes, e-cigarettes, and chewing tobacco. If you need help quitting, ask your health care provider.  If you are sexually active, practice safe sex. Use a condom or other form of protection to prevent STIs (sexually transmitted infections).  Talk with your health care  provider about taking a low-dose aspirin or statin. What's next?  Visit your health care provider once a year for a well check visit.  Ask your health care provider how often you should have your eyes and teeth checked.  Stay up to date on all vaccines. This information is not intended to replace advice given to you by your health care provider. Make sure you discuss any questions you have with your health care provider. Document Revised: 12/23/2017 Document Reviewed: 12/23/2017 Elsevier Patient Education  2020 [employer].

## 2019-02-13 ENCOUNTER — Encounter: Payer: Medicare Other | Admitting: Internal Medicine

## 2019-02-24 ENCOUNTER — Encounter: Payer: Medicare Other | Admitting: Internal Medicine

## 2019-03-06 ENCOUNTER — Encounter: Payer: Self-pay | Admitting: Family Medicine

## 2019-03-13 ENCOUNTER — Other Ambulatory Visit (HOSPITAL_COMMUNITY)
Admission: RE | Admit: 2019-03-13 | Discharge: 2019-03-13 | Disposition: A | Discharge status: Home / Self Care | Payer: Medicare Other | Source: Ambulatory Visit | Attending: Family Medicine | Admitting: Family Medicine

## 2019-03-13 ENCOUNTER — Telehealth: Payer: Self-pay | Admitting: *Deleted

## 2019-03-13 ENCOUNTER — Other Ambulatory Visit: Payer: Self-pay | Admitting: Family Medicine

## 2019-03-13 ENCOUNTER — Other Ambulatory Visit (INDEPENDENT_AMBULATORY_CARE_PROVIDER_SITE_OTHER): Payer: Medicare Other

## 2019-03-13 ENCOUNTER — Other Ambulatory Visit: Payer: Self-pay

## 2019-03-13 DIAGNOSIS — R351 Nocturia: Secondary | ICD-10-CM | POA: Diagnosis not present

## 2019-03-13 DIAGNOSIS — L299 Pruritus, unspecified: Secondary | ICD-10-CM

## 2019-03-13 DIAGNOSIS — I10 Essential (primary) hypertension: Secondary | ICD-10-CM | POA: Diagnosis not present

## 2019-03-13 DIAGNOSIS — M1A9XX0 Chronic gout, unspecified, without tophus (tophi): Secondary | ICD-10-CM

## 2019-03-13 DIAGNOSIS — R739 Hyperglycemia, unspecified: Secondary | ICD-10-CM

## 2019-03-13 DIAGNOSIS — R102 Pelvic and perineal pain: Secondary | ICD-10-CM | POA: Diagnosis not present

## 2019-03-13 DIAGNOSIS — E782 Mixed hyperlipidemia: Secondary | ICD-10-CM

## 2019-03-13 DIAGNOSIS — N289 Disorder of kidney and ureter, unspecified: Secondary | ICD-10-CM | POA: Diagnosis not present

## 2019-03-13 DIAGNOSIS — R972 Elevated prostate specific antigen [PSA]: Secondary | ICD-10-CM | POA: Diagnosis not present

## 2019-03-13 LAB — CBC WITH DIFFERENTIAL/PLATELET
Basophils Absolute: 0.1 10*3/uL (ref 0.0–0.1)
Basophils Relative: 0.8 % (ref 0.0–3.0)
Eosinophils Absolute: 0.3 10*3/uL (ref 0.0–0.7)
Eosinophils Relative: 4.4 % (ref 0.0–5.0)
HCT: 41 % (ref 39.0–52.0)
Hemoglobin: 13.6 g/dL (ref 13.0–17.0)
Lymphocytes Relative: 27.2 % (ref 12.0–46.0)
Lymphs Abs: 1.8 10*3/uL (ref 0.7–4.0)
MCHC: 33.1 g/dL (ref 30.0–36.0)
MCV: 91.3 fl (ref 78.0–100.0)
Monocytes Absolute: 0.6 10*3/uL (ref 0.1–1.0)
Monocytes Relative: 8.8 % (ref 3.0–12.0)
Neutro Abs: 3.9 10*3/uL (ref 1.4–7.7)
Neutrophils Relative %: 58.8 % (ref 43.0–77.0)
Platelets: 204 10*3/uL (ref 150.0–400.0)
RBC: 4.49 Mil/uL (ref 4.22–5.81)
RDW: 14.2 % (ref 11.5–15.5)
WBC: 6.6 10*3/uL (ref 4.0–10.5)

## 2019-03-13 LAB — URINALYSIS
Bilirubin Urine: NEGATIVE
Ketones, ur: NEGATIVE
Leukocytes,Ua: NEGATIVE
Nitrite: NEGATIVE
Specific Gravity, Urine: 1.025 (ref 1.000–1.030)
Total Protein, Urine: NEGATIVE
Urine Glucose: NEGATIVE
Urobilinogen, UA: 0.2 (ref 0.0–1.0)
pH: 6 (ref 5.0–8.0)

## 2019-03-13 LAB — COMPREHENSIVE METABOLIC PANEL
ALT: 20 U/L (ref 0–53)
AST: 19 U/L (ref 0–37)
Albumin: 4.1 g/dL (ref 3.5–5.2)
Alkaline Phosphatase: 81 U/L (ref 39–117)
BUN: 37 mg/dL — ABNORMAL HIGH (ref 6–23)
CO2: 26 mEq/L (ref 19–32)
Calcium: 10.3 mg/dL (ref 8.4–10.5)
Chloride: 103 mEq/L (ref 96–112)
Creatinine, Ser: 1.92 mg/dL — ABNORMAL HIGH (ref 0.40–1.50)
GFR: 34.42 mL/min — ABNORMAL LOW (ref >60.00)
Glucose, Bld: 93 mg/dL (ref 70–99)
Potassium: 4.5 mEq/L (ref 3.5–5.1)
Sodium: 137 mEq/L (ref 135–145)
Total Bilirubin: 0.6 mg/dL (ref 0.2–1.2)
Total Protein: 6.8 g/dL (ref 6.0–8.3)

## 2019-03-13 LAB — LIPID PANEL
Cholesterol: 169 mg/dL (ref 0–200)
HDL: 40.5 mg/dL (ref >39.00)
LDL Cholesterol: 110 mg/dL — ABNORMAL HIGH (ref 0–99)
NonHDL: 128.11
Total CHOL/HDL Ratio: 4
Triglycerides: 91 mg/dL (ref 0.0–149.0)
VLDL: 18.2 mg/dL (ref 0.0–40.0)

## 2019-03-13 LAB — URIC ACID: Uric Acid, Serum: 7.4 mg/dL (ref 4.0–7.8)

## 2019-03-13 LAB — PSA: PSA: 5.53 ng/mL — ABNORMAL HIGH (ref 0.10–4.00)

## 2019-03-13 LAB — TSH: TSH: 4.73 u[IU]/mL — ABNORMAL HIGH (ref 0.35–4.50)

## 2019-03-13 LAB — HEMOGLOBIN A1C: Hgb A1c MFr Bld: 5.7 % (ref 4.6–6.5)

## 2019-03-13 LAB — VITAMIN D 25 HYDROXY (VIT D DEFICIENCY, FRACTURES): VITD: 45.03 ng/mL (ref 30.00–100.00)

## 2019-03-13 NOTE — Telephone Encounter (Signed)
The CMP checks his kidney function but I did order a urinalysis and a urine ancillary test to check for yeast. thanks

## 2019-03-13 NOTE — Telephone Encounter (Signed)
Order's released

## 2019-03-13 NOTE — Addendum Note (Signed)
Addended by: Kelle Darting A on: 03/13/2019 10:36 AM   Modules accepted: Orders

## 2019-03-13 NOTE — Telephone Encounter (Signed)
Pt came in for lab appt today. Wants to make sure his "kidneys are checked" since he had kidney surgery last year and he has been having problems with yeast. He collected a urine sample in case testing could be ordered.  Please advise?

## 2019-03-14 ENCOUNTER — Other Ambulatory Visit (INDEPENDENT_AMBULATORY_CARE_PROVIDER_SITE_OTHER): Payer: Medicare Other

## 2019-03-14 DIAGNOSIS — R7989 Other specified abnormal findings of blood chemistry: Secondary | ICD-10-CM | POA: Diagnosis not present

## 2019-03-14 LAB — T4, FREE: Free T4: 0.69 ng/dL (ref 0.60–1.60)

## 2019-03-16 LAB — URINE CYTOLOGY ANCILLARY ONLY
Bacterial Vaginitis-Urine: NEGATIVE
Candida Urine: NEGATIVE

## 2019-03-29 ENCOUNTER — Encounter: Payer: Medicare Other | Admitting: Internal Medicine

## 2019-03-31 ENCOUNTER — Other Ambulatory Visit: Payer: Self-pay | Admitting: Family Medicine

## 2019-04-02 NOTE — Telephone Encounter (Signed)
Last written: 09/22/18 Last ov: 02/03/19 Next ov: none Contract: 03/30/17 UDS: 12/28/17

## 2019-04-09 ENCOUNTER — Other Ambulatory Visit: Payer: Self-pay | Admitting: Family Medicine

## 2019-04-11 ENCOUNTER — Other Ambulatory Visit: Payer: Self-pay | Admitting: Urology

## 2019-04-11 DIAGNOSIS — C61 Malignant neoplasm of prostate: Secondary | ICD-10-CM

## 2019-04-16 ENCOUNTER — Other Ambulatory Visit: Payer: Self-pay | Admitting: Family Medicine

## 2019-04-25 ENCOUNTER — Encounter (HOSPITAL_COMMUNITY)
Admission: RE | Admit: 2019-04-25 | Discharge: 2019-04-25 | Disposition: A | Discharge status: Home / Self Care | Payer: Medicare Other | Source: Ambulatory Visit | Attending: Urology | Admitting: Urology

## 2019-04-25 ENCOUNTER — Other Ambulatory Visit: Payer: Self-pay

## 2019-04-25 DIAGNOSIS — C61 Malignant neoplasm of prostate: Secondary | ICD-10-CM | POA: Diagnosis present

## 2019-04-25 MED ORDER — TECHNETIUM TC 99M MEDRONATE IV KIT
22.0000 | PACK | Freq: Once | INTRAVENOUS | Status: AC | PRN
  Administered 2019-04-25: 22 via INTRAVENOUS

## 2019-04-28 ENCOUNTER — Encounter: Payer: Self-pay | Admitting: Radiation Oncology

## 2019-04-28 ENCOUNTER — Other Ambulatory Visit: Payer: Self-pay

## 2019-04-28 ENCOUNTER — Ambulatory Visit
Admission: RE | Admit: 2019-04-28 | Discharge: 2019-04-28 | Disposition: A | Discharge status: Home / Self Care | Payer: Medicare Other | Source: Ambulatory Visit | Attending: Radiation Oncology | Admitting: Radiation Oncology

## 2019-04-28 VITALS — Ht 72.0 in | Wt 229.0 lb

## 2019-04-28 DIAGNOSIS — C61 Malignant neoplasm of prostate: Secondary | ICD-10-CM

## 2019-04-28 NOTE — Progress Notes (Signed)
Radiation Oncology         [phone] ________________________________  Initial Outpatient Consultation - Conducted via Telephone due to current COVID-19 concerns for limiting patient exposure  Name: David [name] MRN: [id number]  Date: 04/28/2019  DOB: [birth year]  [id number], Bonnita Levan, MD  McKenzie, Candee Furbish, MD   REFERRING PHYSICIAN: Cleon Gustin, MD  DIAGNOSIS: 74 y.o. gentleman with Stage T1c adenocarcinoma of the prostate with Gleason score of 4+3, and PSA of 5.53.    ICD-10-CM   1. Malignant neoplasm of prostate ([place])  C61     HISTORY OF PRESENT ILLNESS: David [name] is a 74 y.o. male with a diagnosis of prostate cancer. He is an established patient with Dr. Alyson Ingles and [facility], more recently for a history of a ureteral stone in 08/2017. He is now s/p right radical nephrectomy for a nonfunctional right kidney due to chronic ureteral obstruction. His PSA has been slowly trending rising-- 1.29 in 12/2010, 2.02 in 01/2016, 3.16 in 03/2017, 4.4 in 06/2018 (PCP), and more recently 5.78 01/31/2019 with an associated increase in LUTS.    A repeat PSA with his PCP was 5.53 in 03/2019. The patient proceeded to transrectal ultrasound with 12 biopsies of the prostate on 03/28/2019.  The prostate volume measured 31.6 cc.  Out of 12 core biopsies, 6 were positive, all on the left.  The maximum Gleason score was 4+3, and this was seen in left apex, left base, left mid lateral, and left apex lateral (with PNI). Gleason 3+4 was seen in left apex lateral and left mid.  He underwent bone scan and CT A/P on 04/25/2019, both of which showed no evidence of metastatic disease.  The patient reviewed the biopsy results with his urologist and he has kindly been referred today for discussion of potential radiation treatment options.   PREVIOUS RADIATION THERAPY: No  PAST MEDICAL HISTORY:  Past Medical History:  Diagnosis Date  . Abnormal liver function 03/18/2013  . Adenoma of right  adrenal gland 07/11/2002   2.4cm , noted on CT ABD  . Allergy   . Anxiety   . Arthritis of both knees 03/26/2016  . Astigmatism   . Atrial fibrillation ([place])    x1 successful cardioversions  . Back pain   . BCC (basal cell carcinoma of skin)    under right eye and right ear  . Blood transfusion without reported diagnosis   . Cataract 09/29/2016  . Depression   . Diverticulitis   . Diverticulosis   . Fatty liver   . GERD (gastroesophageal reflux disease)   . Gout   . Hearing loss of both ears 03/26/2016  . History of chronic prostatitis    started at age 67  . History of kidney stones   . Hyperglycemia 03/29/2016  . Hyperlipidemia   . Hypertension   . IBS (irritable bowel syndrome) 03/18/2013  . Incomplete right bundle branch block (RBBB)   . Internal hemorrhoids   . Kidney lesion 06/07/2015   Right midportion, 1.1x1.1 cm hyperechoic, noted on [place] ABD  . LAFB (left anterior fascicular block)   . Left anterior fascicular block 08/06/2017   Noted on EKG  . Lipoma of axilla 09/2016  . Liver lesion, right lobe 06/07/2015   2.5x2.4x2.4 cm hypoechoic lesion posterior aspect, noted on [place] ABD  . Low back pain 03/26/2016  . LVH (left ventricular hypertrophy) 08/06/2017   Moderate, noted on ECHO  . LVH (left ventricular hypertrophy) 08/06/2017   Moderate, Noted on ECHO  .  Medicare annual wellness visit, subsequent 03/12/2014  . Muscle cramps   . Nocturia   . OA (osteoarthritis)    Back, Hands, Neck  . Obesity 11/25/2007   Qualifier: Diagnosis of  By: Lenna Gilford MD, Deborra Medina   . Other malaise and fatigue 03/13/2013  . Penis symptom or sign    PENIS RED NO DRAINAGE   . Premature ventricular complex   . Preventative health care 03/07/2015  . Renal insufficiency    Kidney removed  . Scoliosis    Upper thoracic and lumbar  . Sinus bradycardia 08/06/2017   Noted on EKG  . Vitamin D deficiency    resolved      PAST SURGICAL HISTORY: Past Surgical History:  Procedure Laterality Date  .  CARDIOVERSION  07/31/2017  . CATARACT EXTRACTION, BILATERAL    . COLON SURGERY  2009   segmental sigmoid resection  . COLONOSCOPY    . CYSTOSCOPY W/ RETROGRADES Right 06/07/2018   Procedure: CYSTOSCOPY WITH RETROGRADE PYELOGRAM, uphrostogram;  Surgeon: Cleon Gustin, MD;  Location: [facility];  Service: Urology;  Laterality: Right;  . CYSTOSCOPY WITH URETEROSCOPY AND STENT PLACEMENT Right 02/28/2018   Procedure: CYSTOSCOPY WITH URETEROSCOPY [place];  Surgeon: Kathie Rhodes, MD;  Location: [facility];  Service: Urology;  Laterality: Right;  . CYSTOSCOPY/URETEROSCOPY/HOLMIUM LASER/STENT PLACEMENT Right 11/29/2017   Procedure: CYSTOSCOPY/RETROGRADE/URETEROSCOPY/HOLMIUM LASER/STENT PLACEMENT;  Surgeon: Kathie Rhodes, MD;  Location: [facility];  Service: Urology;  Laterality: Right;  . EYE SURGERY Bilateral 01/12/2017   cataract removal  . HOLMIUM LASER APPLICATION Right [id number]   Procedure: HOLMIUM LASER APPLICATION;  Surgeon: Kathie Rhodes, MD;  Location: [facility];  Service: Urology;  Laterality: Right;  . IR BALLOON DILATION URETERAL STRICTURE RIGHT  03/14/2018  . IR NEPHRO TUBE REMOV/FL  03/17/2018  . IR NEPHROSTOGRAM RIGHT THRU EXISTING ACCESS  03/17/2018  . IR NEPHROSTOMY EXCHANGE RIGHT  03/14/2018  . IR NEPHROSTOMY EXCHANGE RIGHT  06/21/2018  . IR NEPHROSTOMY PLACEMENT RIGHT  03/02/2018  . IR NEPHROSTOMY PLACEMENT RIGHT  04/20/2018  . IR URETERAL STENT PLACEMENT EXISTING ACCESS RIGHT  03/14/2018  . NOSE SURGERY     Submucous resection age 12  . NUCLEAR STRESS TEST  06/03/2009  . ROBOT ASSISTED LAPAROSCOPIC NEPHRECTOMY Right 08/04/2018   Procedure: XI ROBOTIC ASSISTED LAPAROSCOPIC NEPHRECTOMY;  Surgeon: Cleon Gustin, MD;  Location: [facility];  Service: Urology;  Laterality: Right;  3 HRS  . ROBOT ASSISTED PYELOPLASTY Right 06/07/2018   Procedure: attempted XI ROBOTIC ASSISTED PYELOPLASTY, lysis of adhesions;  Surgeon: Cleon Gustin, MD;  Location: [facility];   Service: Urology;  Laterality: Right;  3 HRS  . SKIN BIOPSY    . URETEROSCOPY WITH HOLMIUM LASER LITHOTRIPSY Right 09/24/2017   Procedure: CYSTOSCOPY, URETEROSCOPY WITH HOLMIUM LASER LITHOTRIPSY, STENT PLACEMENT;  Surgeon: Kathie Rhodes, MD;  Location: [facility];  Service: Urology;  Laterality: Right;    FAMILY HISTORY:  Family History  Problem Relation Age of Onset  . Heart disease Mother   . Hypertension Mother   . Stroke Mother   . Colon cancer Mother   . Breast cancer Mother   . Heart disease Father        pacemaker  . Aortic aneurysm Father   . Hypertension Father   . Heart disease Sister   . Atrial fibrillation Sister   . Obesity Sister   . Sleep apnea Sister   . Heart attack Brother   . Other Brother  muscle disease  . Arthritis Brother   . Stroke Brother   . Atrial fibrillation Brother   . Cancer Maternal Grandmother        ?  Marland Kitchen Heart attack Maternal Grandmother   . Diabetes Maternal Grandmother   . Cancer Maternal Grandfather        hodgin's lymphoma  . Heart attack Paternal Grandmother   . Anxiety disorder Paternal Grandmother   . Pneumonia Paternal Grandfather   . Heart attack Brother   . Atrial fibrillation Brother   . Atrial fibrillation Brother   . Heart attack Brother   . Hypertension Brother   . Hyperlipidemia Brother   . Heart attack Brother   . Other Brother        heart valve operation  . Atrial fibrillation Brother   . Esophageal cancer Neg Hx   . Rectal cancer Neg Hx   . Stomach cancer Neg Hx     SOCIAL HISTORY:  Social History   Socioeconomic History  . Marital status: Married    Spouse name: Not on file  . Number of children: 3  . Years of education: Not on file  . Highest education level: Not on file  Occupational History  . 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 Smoking status: Former Smoker    Packs/day: 1.00    Years: 6.00    Pack years: 6.00    Types: Cigarettes    Start date: 01/12/1969      Quit date: 01/12/1969    Years since quitting: 50.3  . Smokeless tobacco: Never Used  . Tobacco comment: [id number]  Substance and Sexual Activity  . Alcohol use: Not Currently    Comment: NONE SINCE July 18 2017  . Drug use: No  . Sexual activity: Yes  Other Topics Concern  . Not on file  Social History Narrative   The patient is married for the second time, he has 3 sons.   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   2 alcoholic beverages most days.   1 caffeinated beverage daily   No drug use no current tobacco use he is a prior smoker   12/24/2016   Social Determinants of Health   Financial Resource Strain:   . Difficulty of Paying Living Expenses:   Food Insecurity:   . Worried About Charity fundraiser in the Last Year:   . Arboriculturist in the Last Year:   Transportation Needs:   . Film/video editor (Medical):   Marland Kitchen Lack of Transportation (Non-Medical):   Physical Activity:   . Days of Exercise per Week:   . Minutes of Exercise per Session:   Stress:   . Feeling of Stress :   Social Connections:   . Frequency of Communication with Friends and Family:   . Frequency of Social Gatherings with Friends and Family:   . Attends Religious Services:   . Active Member of Clubs or Organizations:   . Attends Archivist Meetings:   Marland Kitchen Marital Status:   Intimate Partner Violence:   . Fear of Current or Ex-Partner:   . Emotionally Abused:   Marland Kitchen Physically Abused:   . Sexually Abused:     ALLERGIES: Cefaclor, Cephalosporins, and Penicillins  MEDICATIONS:  Current Outpatient Medications  Medication Sig Dispense Refill  . atenolol (TENORMIN) 25 MG tablet TAKE ONE TABLET BY MOUTH TWICE A DAY 180 tablet 0  . atorvastatin (LIPITOR) 10 MG tablet Take 1 tablet (  10 mg total) by mouth daily. 90 tablet 3  . cyclobenzaprine (FLEXERIL) 5 MG tablet Take 1 tablet (5 mg total) by mouth 3 (three) times daily as needed for muscle spasms. 30 tablet 2  . FLUoxetine (PROZAC) 10  MG tablet TAKE ONE TABLET BY MOUTH TWICE A DAY 60 tablet 1  . Menthol, Topical Analgesic, (ICY HOT BACK EX) Apply 1 application topically daily as needed (back pain).    Marland Kitchen omeprazole (PRILOSEC) 20 MG capsule Take 20 mg by mouth daily as needed (acid reflux).     Marland Kitchen ascorbic acid (VITAMIN C) 1000 MG tablet Take by mouth.    . clotrimazole-betamethasone (LOTRISONE) cream Apply 1 application topically 2 (two) times daily as needed (yeast infection).  30 g 0  . Colchicine 0.6 MG CAPS 1 tab po bid (Patient not taking: Reported on 04/28/2019) 60 capsule 0  . ketoconazole (NIZORAL) 2 % cream Apply 1 application topically daily. (Patient not taking: Reported on 04/28/2019) 60 g 1  . ketoconazole (NIZORAL) 2 % shampoo Apply 1 application topically 2 (two) times a week. (Patient not taking: Reported on 04/28/2019) 120 mL 12  . triamcinolone cream (KENALOG) 0.1 %      No current facility-administered medications for this encounter.    REVIEW OF SYSTEMS:  On review of systems, the patient reports that he is doing well overall. He denies any chest pain, shortness of breath, cough, fevers, chills, night sweats, unintended weight changes. He denies any bowel disturbances, and denies abdominal pain, nausea or vomiting. He denies any new musculoskeletal or joint aches or pains. His IPSS was 8, indicating moderate urinary symptoms. He reports occasional dysuria. His SHIM was 17, indicating he has moderate erectile dysfunction. A complete review of systems is obtained and is otherwise negative.    PHYSICAL EXAM:  Wt Readings from Last 3 Encounters:  04/28/19 229 lb (103.9 kg)  02/03/19 220 lb (99.8 kg)  11/24/18 220 lb (99.8 kg)   Temp Readings from Last 3 Encounters:  11/24/18 (!) 96.2 F (35.7 C)  10/31/18 (!) 97 F (36.1 C) (Temporal)  09/26/18 (!) 96.5 F (35.8 C) (Temporal)   BP Readings from Last 3 Encounters:  02/10/19 133/75  02/03/19 (!) 145/80  10/31/18 126/78   Pulse Readings from Last 3  Encounters:  02/10/19 60  02/03/19 (!) 55  10/31/18 (!) 59   Pain Assessment Pain Score: 5  Pain Frequency: Constant Pain Loc: (back, joint and stomach pain)/10  Physical exam not performed in light of telephone consult visit format.   KPS = 90  100 - Normal; no complaints; no evidence of disease. 90   - Able to carry on normal activity; minor signs or symptoms of disease. 80   - Normal activity with effort; some signs or symptoms of disease. 72   - Cares for self; unable to carry on normal activity or to do active work. 60   - Requires occasional assistance, but is able to care for most of his personal needs. 50   - Requires considerable assistance and frequent medical care. 57   - Disabled; requires special care and assistance. 57   - Severely disabled; hospital admission is indicated although death not imminent. 45   - Very sick; hospital admission necessary; active supportive treatment necessary. 10   - Moribund; fatal processes progressing rapidly. 0     - Dead  Karnofsky DA, Abelmann WH, Craver LS and Burchenal [facility] [phone]) The use of the nitrogen mustards in the palliative treatment  of carcinoma: with particular reference to bronchogenic carcinoma Cancer 1 634-56  LABORATORY DATA:  Lab Results  Component Value Date   WBC 6.6 03/13/2019   HGB 13.6 03/13/2019   HCT 41.0 03/13/2019   MCV 91.3 03/13/2019   PLT 204.0 03/13/2019   Lab Results  Component Value Date   NA 137 03/13/2019   K 4.5 03/13/2019   CL 103 03/13/2019   CO2 26 03/13/2019   Lab Results  Component Value Date   ALT 20 03/13/2019   AST 19 03/13/2019   ALKPHOS 81 03/13/2019   BILITOT 0.6 03/13/2019     RADIOGRAPHY: NM Bone Scan Whole Body  Result Date: 04/25/2019 CLINICAL DATA:  Prostate cancer, PSA 5.78 EXAM: NUCLEAR MEDICINE WHOLE BODY BONE SCAN TECHNIQUE: Whole body anterior and posterior images were obtained approximately 3 hours after intravenous injection of radiopharmaceutical.  RADIOPHARMACEUTICALS:  22.0 mCi Technetium-33m MDP IV COMPARISON:  07/01/2018 FINDINGS: Anterior and posterior whole body planar images are obtained. No significant excretion of radiotracer from the right kidney, compatible with known right renal dysfunction and ureteral stricture. Normal excretion within the left kidney and bladder. Activity of the bilateral shoulders, bilateral knees, and bilateral feet compatible with degenerative changes. There is left convex scoliosis of the upper thoracic spine. I do not see any radiotracer activity to suggest an acute or destructive process. IMPRESSION: 1. No evidence of bony metastatic disease. 2. Degenerative type activity of the shoulders, knees, and feet. 3. No significant radiotracer excretion from the right kidney, consistent with chronic right renal dysfunction. Electronically Signed   By: Randa Ngo M.D.   On: 04/25/2019 23:19      IMPRESSION/PLAN: This visit was conducted via Telephone to spare the patient unnecessary potential exposure in the healthcare setting during the current COVID-19 pandemic. 1. 74 y.o. gentleman with Stage T1c adenocarcinoma of the prostate with Gleason Score of 4+3, and PSA of 5.53. We discussed the patient's workup and outlined the nature of prostate cancer in this setting. The patient's T stage, Gleason's score, and PSA put him into the intermediate risk group. Accordingly, he is eligible for a variety of potential treatment options including prostatectomy, brachytherapy, or 5.5 weeks of external radiation. We discussed the available radiation techniques, and focused on the details and logistics of delivery. We discussed and outlined the risks, benefits, short and long-term effects associated with radiotherapy and compared and contrasted these with prostatectomy. We discussed the role of SpaceOAR in reducing the rectal toxicity associated with radiotherapy.  He was encouraged to ask questions that were answered to his stated  satisfaction.  At the end of the conversation, the patient is interested in moving forward with brachytherapy and use of SpaceOAR to reduce rectal toxicity from radiotherapy.  We will share our discussion with Dr. Alyson Ingles and move forward with scheduling his [facility] planning appointment in the near future.  The patient will be contacted by Romie Jumper in our office who will be working closely with him to coordinate OR scheduling and pre and post procedure appointments.  We will contact the pharmaceutical rep to ensure that [place] is available at the time of procedure.  He will have a prostate MRI following his post-seed CT SIM to confirm appropriate distribution of the [place].  Given current concerns for patient exposure during the COVID-19 pandemic, this encounter was conducted via telephone. The patient was notified in advance and was offered a MyChart meeting to allow for face to face communication but unfortunately reported that he did not  have the appropriate resources/technology to support such a visit and instead preferred to proceed with telephone consult. The patient has given verbal consent for this type of encounter. The time spent during this encounter was 60 minutes. The attendants for this meeting include Tyler Pita MD, Ashlyn Bruning PA-C, [place], and patient, David [name]. During the encounter, Tyler Pita MD, Ashlyn Bruning PA-C, and scribe, Wilburn Mylar were located at [place].  Patient, David [name] was located at home.    Nicholos Johns, PA-C    Tyler Pita, MD  [facility] Direct Dial: [phone]  Fax: [phone] [email or web address]  Skype  [email or web address]  This document serves as a record of services personally performed by Tyler Pita, MD and Freeman Caldron, PA-C. It was created on their behalf by Wilburn Mylar, a trained medical scribe. The  creation of this record is based on the scribe's personal observations and the provider's statements to them. This document has been checked and approved by the attending provider.

## 2019-04-28 NOTE — Progress Notes (Signed)
GU Location of Tumor / Histology: prostatic adenocarcainoma  If Prostate Cancer, Gleason Score is (4 + 3) and PSA is (5.78) on 01/2019. Prostate volume: 31.6 grams  David [name] has been monitoring his PSA for 20 years. He reports his PSA 20 years ago was 1.7. Reports two years ago his PSA was 3.1. Then, January of 2021 his PSA went from 4.4 to 5.7.   Biopsies of prostate (if applicable) revealed:   Past/Anticipated interventions by urology, if any: prostate biopsy, CT abd/pelvis (negative), bone scan (negative), referral to Dr. Tammi Klippel for consideration of radiotherapy  Note: Patient had a right adrenal mass. Patient has one kidney.  Past/Anticipated interventions by medical oncology, if any: no  Weight changes, if any: no  Bowel/Bladder complaints, if any: IPSS 8. SHIM 17. Denies hematuria or leakage. Reports occasional dysuria. Reports he used to struggle with constipation but has that uncontrol now thus no bowel complaints.    Nausea/Vomiting, if any: no  Pain issues, if any:  Yes. Reports constant pain in his neck , hips, knees, ankles and back. Also, reports stomach pain s/p last surgery and testicular tenderness.   SAFETY ISSUES:  Prior radiation? no  Pacemaker/ICD? no  Possible current pregnancy? no, male patient  Is the patient on methotrexate? no  Current Complaints / other details:  74 year old male. Married. 3 children. 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

## 2019-04-28 NOTE — Progress Notes (Signed)
See progress note under physician encounter. 

## 2019-05-02 ENCOUNTER — Encounter: Payer: Self-pay | Admitting: Medical Oncology

## 2019-05-02 NOTE — Progress Notes (Signed)
Spoke with patient to introduce myself as the prostate nurse navigator and discuss my role. I was unable to meet him 4/16, when he consulted with Dr. Tammi Klippel. He states the consult went extremely well and he has chosen brachytherapy. He asked if he is jumping the gun in moving forward with treatment. I discussed his Gleason, PSA and number of cores and how important it is to move forward with treatment while the cancer is contained in the prostate. I discussed the next steps and he is ready to move forward. I gave him my contact information and asked him to call me with questions or concerns. He voiced understanding and was very appreciative of the call.

## 2019-05-12 ENCOUNTER — Telehealth: Payer: Self-pay | Admitting: *Deleted

## 2019-05-12 ENCOUNTER — Telehealth: Payer: Self-pay | Admitting: Medical Oncology

## 2019-05-12 NOTE — Telephone Encounter (Signed)
Patient called to inquire about appointments for brachytherapy. I explained David [name] will coordinate with Dr. Noland Fordyce office and contact him with the surgical date and a CT simulation date. Per David [name], Dr. Noland Fordyce scheduler is on vacation this week and she will contact upon her return.  He voiced understanding and will wait for call. I wished him a "Happy Birthday". He was very Patent attorney .

## 2019-05-12 NOTE — Telephone Encounter (Signed)
Called patient to ask questions, spoke with patient 

## 2019-05-16 ENCOUNTER — Telehealth: Payer: Self-pay | Admitting: *Deleted

## 2019-05-16 NOTE — Telephone Encounter (Signed)
Called patient to update, spoke with patient. 

## 2019-05-22 ENCOUNTER — Other Ambulatory Visit: Payer: Self-pay | Admitting: Urology

## 2019-05-23 ENCOUNTER — Telehealth: Payer: Self-pay | Admitting: *Deleted

## 2019-05-23 ENCOUNTER — Other Ambulatory Visit: Payer: Self-pay | Admitting: Urology

## 2019-05-23 NOTE — Telephone Encounter (Signed)
Called patient to inform of implant date, spoke with patient and he is aware of this date. 

## 2019-05-31 ENCOUNTER — Telehealth: Payer: Self-pay | Admitting: *Deleted

## 2019-05-31 NOTE — Telephone Encounter (Signed)
CALLED PATIENT TO REMIND OF PRE-SEED APPTS. FOR 06-01-19, SPOKE WITH PATIENT AND HE IS AWARE OF THESE APPTS.

## 2019-06-01 ENCOUNTER — Encounter: Payer: Self-pay | Admitting: Urology

## 2019-06-01 ENCOUNTER — Other Ambulatory Visit: Payer: Self-pay

## 2019-06-01 ENCOUNTER — Other Ambulatory Visit: Payer: Self-pay | Admitting: Urology

## 2019-06-01 ENCOUNTER — Ambulatory Visit
Admission: RE | Admit: 2019-06-01 | Discharge: 2019-06-01 | Disposition: A | Discharge status: Home / Self Care | Payer: Medicare Other | Source: Ambulatory Visit | Attending: Radiation Oncology | Admitting: Radiation Oncology

## 2019-06-01 ENCOUNTER — Encounter (HOSPITAL_COMMUNITY)
Admission: RE | Admit: 2019-06-01 | Discharge: 2019-06-01 | Disposition: A | Discharge status: Home / Self Care | Payer: Medicare Other | Source: Ambulatory Visit | Attending: Urology | Admitting: Urology

## 2019-06-01 ENCOUNTER — Encounter: Payer: Self-pay | Admitting: Medical Oncology

## 2019-06-01 ENCOUNTER — Ambulatory Visit
Admission: RE | Admit: 2019-06-01 | Discharge: 2019-06-01 | Disposition: A | Discharge status: Home / Self Care | Payer: Medicare Other | Source: Ambulatory Visit | Attending: Urology | Admitting: Urology

## 2019-06-01 ENCOUNTER — Ambulatory Visit (HOSPITAL_COMMUNITY)
Admission: RE | Admit: 2019-06-01 | Discharge: 2019-06-01 | Disposition: A | Discharge status: Home / Self Care | Payer: Medicare Other | Source: Ambulatory Visit | Attending: Urology | Admitting: Urology

## 2019-06-01 DIAGNOSIS — Z01818 Encounter for other preprocedural examination: Secondary | ICD-10-CM | POA: Insufficient documentation

## 2019-06-01 DIAGNOSIS — C61 Malignant neoplasm of prostate: Secondary | ICD-10-CM | POA: Insufficient documentation

## 2019-06-01 NOTE — Progress Notes (Signed)
Patient in for Post Seed appointment. Denies any pain with urination c/o spasms with his urethra. Some discomfort with urination. Itching noted without hematuria. Follow up appointment with urologist in July.

## 2019-06-04 NOTE — Progress Notes (Signed)
  Radiation Oncology         [phone] ________________________________  Name: David [name] MRN: [id number]  Date: 06/01/2019  DOB: 1945/07/09  SIMULATION AND TREATMENT PLANNING NOTE PUBIC ARCH STUDY  [id number], Bonnita Levan, MD  Alyson Ingles Candee Furbish, MD  DIAGNOSIS:  Oncology History  Malignant neoplasm of prostate ([place])  03/28/2019 Cancer Staging   Staging form: Prostate, AJCC 8th Edition - Clinical stage from 03/28/2019: Stage IIC (cT1c, cN0, cM0, PSA: 5.8, Grade Group: 3) - Signed by Freeman Caldron, PA-C on 04/28/2019   04/28/2019 Initial Diagnosis   Malignant neoplasm of prostate ([place])       ICD-10-CM   1. Malignant neoplasm of prostate ([place])  C61     COMPLEX SIMULATION:  The patient presented today for evaluation for possible prostate seed implant. He was brought to the radiation planning suite and placed supine on the CT couch. A 3-dimensional image study set was obtained in upload to the planning computer. There, on each axial slice, I contoured the prostate gland. Then, using three-dimensional radiation planning tools I reconstructed the prostate in view of the structures from the transperineal needle pathway to assess for possible pubic arch interference. In doing so, I did not appreciate any pubic arch interference. Also, the patient's prostate volume was estimated based on the drawn structure. The volume was 33 cc.  Given the pubic arch appearance and prostate volume, patient remains a good candidate to proceed with prostate seed implant. Today, he freely provided informed written consent to proceed.    PLAN: The patient will undergo prostate seed implant.   ________________________________  Sheral Apley. Tammi Klippel, M.D.

## 2019-06-28 ENCOUNTER — Other Ambulatory Visit: Payer: Self-pay | Admitting: *Deleted

## 2019-06-28 MED ORDER — FLUOXETINE HCL 10 MG PO TABS: 10.0000 mg | ORAL_TABLET | Freq: Two times a day (BID) | ORAL | 0 refills | Status: DC

## 2019-07-11 ENCOUNTER — Other Ambulatory Visit: Payer: Self-pay | Admitting: Family Medicine

## 2019-07-13 ENCOUNTER — Telehealth: Payer: Self-pay | Admitting: Family Medicine

## 2019-07-13 ENCOUNTER — Other Ambulatory Visit: Payer: Self-pay

## 2019-07-13 ENCOUNTER — Telehealth (INDEPENDENT_AMBULATORY_CARE_PROVIDER_SITE_OTHER): Payer: Medicare Other | Admitting: Family Medicine

## 2019-07-13 ENCOUNTER — Encounter: Payer: Self-pay | Admitting: Family Medicine

## 2019-07-13 VITALS — BP 145/85

## 2019-07-13 DIAGNOSIS — C61 Malignant neoplasm of prostate: Secondary | ICD-10-CM

## 2019-07-13 DIAGNOSIS — I10 Essential (primary) hypertension: Secondary | ICD-10-CM

## 2019-07-13 DIAGNOSIS — N289 Disorder of kidney and ureter, unspecified: Secondary | ICD-10-CM

## 2019-07-13 DIAGNOSIS — F418 Other specified anxiety disorders: Secondary | ICD-10-CM

## 2019-07-13 DIAGNOSIS — E782 Mixed hyperlipidemia: Secondary | ICD-10-CM | POA: Diagnosis not present

## 2019-07-13 DIAGNOSIS — R739 Hyperglycemia, unspecified: Secondary | ICD-10-CM | POA: Diagnosis not present

## 2019-07-13 MED ORDER — ATENOLOL 25 MG PO TABS: 25.0000 mg | ORAL_TABLET | Freq: Three times a day (TID) | ORAL | 1 refills | Status: DC

## 2019-07-13 MED ORDER — FLUCONAZOLE 150 MG PO TABS: 150.0000 mg | ORAL_TABLET | ORAL | 1 refills | Status: DC

## 2019-07-13 MED ORDER — TRIAMCINOLONE ACETONIDE 0.1 % EX CREA: 1.0000 "application " | TOPICAL_CREAM | Freq: Two times a day (BID) | CUTANEOUS | 1 refills | Status: DC | PRN

## 2019-07-13 NOTE — Telephone Encounter (Signed)
lvm for patient to call back for a lab next week and f/u with Dr. B in 3 months order are in

## 2019-07-17 NOTE — Assessment & Plan Note (Signed)
hgba1c acceptable, minimize simple carbs. Increase exercise as tolerated.  

## 2019-07-17 NOTE — Assessment & Plan Note (Signed)
Doing well despite the stress of Prostate cancer diagnosis. No changes

## 2019-07-17 NOTE — Assessment & Plan Note (Signed)
Monitor and report any concerns 

## 2019-07-17 NOTE — Assessment & Plan Note (Signed)
Hydrate and monitor 

## 2019-07-17 NOTE — Progress Notes (Addendum)
Virtual Visit via Video Note  I connected with David [name] on 07/13/2019 at  2:40 PM EDT by a video enabled telemedicine application and verified that I am speaking with the correct person using two identifiers.  Location: Patient: home, patient was in the visit Provider: home, provider was in the visit   I discussed the limitations of evaluation and management by telemedicine and the availability of in person appointments. The patient expressed understanding and agreed to proceed. Kem Boroughs, CMA was able to get the patient set up on a video visit   Subjective:    Patient ID: David [name], male    DOB: 1945/04/24, 74 y.o.   MRN: [id number]  Chief Complaint  Patient presents with  . itching around urethra  . aches and pains  . Tinnitus  . blood pressure running high  . blood work  . Fatigue    HPI Patient is in today for follow up on chronic medical concerns. Overall he is doing well. No recent febrile illness or hospitalizations. He has had 12 prostate biopsies and the 6 on the left were clean while the 6 on the right showed cancer. He is managing the stress well all thins considered. His blood pressure has been 125-145/ 70-85. He is eating well and staying active. Denies CP/palp/SOB/HA/congestion/fevers/GI c/o. Taking meds as prescribed   Past Medical History:  Diagnosis Date  . Abnormal liver function 03/18/2013  . Adenoma of right adrenal gland 07/11/2002   2.4cm , noted on CT ABD  . Allergy   . Anxiety   . Arthritis of both knees 03/26/2016  . Astigmatism   . Atrial fibrillation ([place])    x1 successful cardioversions  . Back pain   . BCC (basal cell carcinoma of skin)    under right eye and right ear  . Blood transfusion without reported diagnosis   . Cataract 09/29/2016  . Depression   . Diverticulitis   . Diverticulosis   . Fatty liver   . GERD (gastroesophageal reflux disease)   . Gout   . Hearing loss of both ears 03/26/2016  . History of chronic  prostatitis    started at age 47  . History of kidney stones   . Hyperglycemia 03/29/2016  . Hyperlipidemia   . Hypertension   . IBS (irritable bowel syndrome) 03/18/2013  . Incomplete right bundle branch block (RBBB)   . Internal hemorrhoids   . Kidney lesion 06/07/2015   Right midportion, 1.1x1.1 cm hyperechoic, noted on [place] ABD  . LAFB (left anterior fascicular block)   . Left anterior fascicular block 08/06/2017   Noted on EKG  . Lipoma of axilla 09/2016  . Liver lesion, right lobe 06/07/2015   2.5x2.4x2.4 cm hypoechoic lesion posterior aspect, noted on [place] ABD  . Low back pain 03/26/2016  . LVH (left ventricular hypertrophy) 08/06/2017   Moderate, noted on ECHO  . LVH (left ventricular hypertrophy) 08/06/2017   Moderate, Noted on ECHO  . Medicare annual wellness visit, subsequent 03/12/2014  . Muscle cramps   . Nocturia   . OA (osteoarthritis)    Back, Hands, Neck  . Obesity 11/25/2007   Qualifier: Diagnosis of  By: Lenna Gilford MD, Deborra Medina   . Other malaise and fatigue 03/13/2013  . Penis symptom or sign    PENIS RED NO DRAINAGE   . Premature ventricular complex   . Preventative health care 03/07/2015  . Renal insufficiency    Kidney removed  . Scoliosis    Upper thoracic  and lumbar  . Sinus bradycardia 08/06/2017   Noted on EKG  . Vitamin D deficiency    resolved    Past Surgical History:  Procedure Laterality Date  . CARDIOVERSION  07/31/2017  . CATARACT EXTRACTION, BILATERAL    . COLON SURGERY  2009   segmental sigmoid resection  . COLONOSCOPY    . CYSTOSCOPY W/ RETROGRADES Right 06/07/2018   Procedure: CYSTOSCOPY WITH RETROGRADE PYELOGRAM, uphrostogram;  Surgeon: Cleon Gustin, MD;  Location: [facility];  Service: Urology;  Laterality: Right;  . CYSTOSCOPY WITH URETEROSCOPY AND STENT PLACEMENT Right 02/28/2018   Procedure: CYSTOSCOPY WITH URETEROSCOPY Concha Se;  Surgeon: Kathie Rhodes, MD;  [facility];  Service: Urology;  Laterality: Right;    . CYSTOSCOPY/URETEROSCOPY/HOLMIUM LASER/STENT PLACEMENT Right 11/29/2017   Procedure: CYSTOSCOPY/RETROGRADE/URETEROSCOPY/HOLMIUM LASER/STENT PLACEMENT;  Surgeon: Kathie Rhodes, MD;  Location: [facility];  Service: Urology;  Laterality: Right;  . EYE SURGERY Bilateral 01/12/2017   cataract removal  . HOLMIUM LASER APPLICATION Right 09/27/3844   Procedure: HOLMIUM LASER APPLICATION;  Surgeon: Kathie Rhodes, MD;  Location: [facility];  Service: Urology;  Laterality: Right;  . IR BALLOON DILATION URETERAL STRICTURE RIGHT  03/14/2018  . IR NEPHRO TUBE REMOV/FL  03/17/2018  . IR NEPHROSTOGRAM RIGHT THRU EXISTING ACCESS  03/17/2018  . IR NEPHROSTOMY EXCHANGE RIGHT  03/14/2018  . IR NEPHROSTOMY EXCHANGE RIGHT  06/21/2018  . IR NEPHROSTOMY PLACEMENT RIGHT  03/02/2018  . IR NEPHROSTOMY PLACEMENT RIGHT  04/20/2018  . IR URETERAL STENT PLACEMENT EXISTING ACCESS RIGHT  03/14/2018  . NOSE SURGERY     Submucous resection age 62  . NUCLEAR STRESS TEST  06/03/2009  . ROBOT ASSISTED LAPAROSCOPIC NEPHRECTOMY Right 08/04/2018   Procedure: XI ROBOTIC ASSISTED LAPAROSCOPIC NEPHRECTOMY;  Surgeon: Cleon Gustin, MD;  Location: [facility];  Service: Urology;  Laterality: Right;  3 HRS  . ROBOT ASSISTED PYELOPLASTY Right 06/07/2018   Procedure: attempted XI ROBOTIC ASSISTED PYELOPLASTY, lysis of adhesions;  Surgeon: Cleon Gustin, MD;  Location: [facility];  Service: Urology;  Laterality: Right;  3 HRS  . SKIN BIOPSY    . URETEROSCOPY WITH HOLMIUM LASER LITHOTRIPSY Right 09/24/2017   Procedure: CYSTOSCOPY, URETEROSCOPY WITH HOLMIUM LASER LITHOTRIPSY, STENT PLACEMENT;  Surgeon: Kathie Rhodes, MD;  Location: [facility];  Service: Urology;  Laterality: Right;    Family History  Problem Relation Age of Onset  . Heart disease Mother   . Hypertension Mother   . Stroke Mother   . Colon cancer Mother   . Breast cancer Mother   . Heart disease Father        pacemaker  . Aortic aneurysm Father   .  Hypertension Father   . Heart disease Sister   . Atrial fibrillation Sister   . Obesity Sister   . Sleep apnea Sister   . Heart attack Brother   . Other Brother        muscle disease  . Arthritis Brother   . Stroke Brother   . Atrial fibrillation Brother   . Cancer Maternal Grandmother        ?  Marland Kitchen Heart attack Maternal Grandmother   . Diabetes Maternal Grandmother   . Cancer Maternal Grandfather        hodgin's lymphoma  . Heart attack Paternal Grandmother   . Anxiety disorder Paternal Grandmother   . Pneumonia Paternal Grandfather   . Heart attack Brother   . Atrial fibrillation Brother   . Atrial fibrillation Brother   .  Heart attack Brother   . Hypertension Brother   . Hyperlipidemia Brother   . Heart attack Brother   . Other Brother        heart valve operation  . Atrial fibrillation Brother   . Esophageal cancer Neg Hx   . Rectal cancer Neg Hx   . Stomach cancer Neg Hx     Social History   Socioeconomic History  . Marital status: Married    Spouse name: Not on file  . Number of children: 3  . Years of education: Not on file  . Highest education level: Not on file  Occupational History  . 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 Smoking status: Former Smoker    Packs/day: 1.00    Years: 6.00    Pack years: 6.00    Types: Cigarettes    Start date: 01/12/1969    Quit date: 01/12/1969    Years since quitting: 50.5  . Smokeless tobacco: Never Used  . Tobacco comment: [id number]  Vaping Use  . Vaping Use: Never used  Substance and Sexual Activity  . Alcohol use: Not Currently    Comment: NONE SINCE July 18 2017  . Drug use: No  . Sexual activity: Yes  Other Topics Concern  . Not on file  Social History Narrative   The patient is married for the second time, he has 3 sons.   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   2 alcoholic beverages most days.   1 caffeinated beverage daily   No drug use no current tobacco use he is a prior smoker    12/24/2016   Social Determinants of Health   Financial Resource Strain:   . Difficulty of Paying Living Expenses:   Food Insecurity:   . Worried About Charity fundraiser in the Last Year:   . Arboriculturist in the Last Year:   Transportation Needs:   . Film/video editor (Medical):   Marland Kitchen Lack of Transportation (Non-Medical):   Physical Activity:   . Days of Exercise per Week:   . Minutes of Exercise per Session:   Stress:   . Feeling of Stress :   Social Connections:   . Frequency of Communication with Friends and Family:   . Frequency of Social Gatherings with Friends and Family:   . Attends Religious Services:   . Active Member of Clubs or Organizations:   . Attends Archivist Meetings:   Marland Kitchen Marital Status:   Intimate Partner Violence:   . Fear of Current or Ex-Partner:   . Emotionally Abused:   Marland Kitchen Physically Abused:   . Sexually Abused:     Outpatient Medications Prior to Visit  Medication Sig Dispense Refill  . FLUoxetine (PROZAC) 10 MG tablet Take 1 tablet (10 mg total) by mouth 2 (two) times daily. 180 tablet 0  . LORazepam (ATIVAN) 1 MG tablet Take 1 mg by mouth 2 (two) times daily.    Marland Kitchen atenolol (TENORMIN) 25 MG tablet Take 1 tablet (25 mg total) by mouth 2 (two) times daily. 180 tablet 0  . ascorbic acid (VITAMIN C) 1000 MG tablet Take by mouth.    Marland Kitchen atorvastatin (LIPITOR) 10 MG tablet Take 1 tablet (10 mg total) by mouth daily. (Patient not taking: Reported on 07/13/2019) 90 tablet 3  . Colchicine 0.6 MG CAPS 1 tab po bid 60 capsule 0  . cyclobenzaprine (FLEXERIL) 5 MG tablet Take 1 tablet (5 mg total)  by mouth 3 (three) times daily as needed for muscle spasms. (Patient not taking: Reported on 07/13/2019) 30 tablet 2  . ketoconazole (NIZORAL) 2 % cream Apply 1 application topically daily. (Patient not taking: Reported on 07/13/2019) 60 g 1  . ketoconazole (NIZORAL) 2 % shampoo Apply 1 application topically 2 (two) times a week. (Patient not taking: Reported on  07/13/2019) 120 mL 12  . Menthol, Topical Analgesic, (ICY HOT BACK EX) Apply 1 application topically daily as needed (back pain). (Patient not taking: Reported on 07/13/2019)    . omeprazole (PRILOSEC) 20 MG capsule Take 20 mg by mouth daily as needed (acid reflux).  (Patient not taking: Reported on 07/13/2019)    . clotrimazole-betamethasone (LOTRISONE) cream Apply 1 application topically 2 (two) times daily as needed (yeast infection).  (Patient not taking: Reported on 07/13/2019) 30 g 0  . triamcinolone cream (KENALOG) 0.1 %  (Patient not taking: Reported on 07/13/2019)     No facility-administered medications prior to visit.    Allergies  Allergen Reactions  . Cefaclor Hives  . Cephalosporins Hives  . Penicillins Rash    Mild maculopapular rash Has patient had a PCN reaction causing immediate rash, facial/tongue/throat swelling, SOB or lightheadedness with hypotension: No Has patient had a PCN reaction causing severe rash involving mucus membranes or skin necrosis: No Has patient had a PCN reaction that required hospitalization: No Has patient had a PCN reaction occurring within the last 10 years: No If all of the above answers are "NO", then may proceed with Cephalosporin use.     Review of Systems  Constitutional: Negative for fever and malaise/fatigue.  HENT: Negative for congestion.   Eyes: Negative for blurred vision.  Respiratory: Negative for shortness of breath.   Cardiovascular: Negative for chest pain, palpitations and leg swelling.  Gastrointestinal: Negative for abdominal pain, blood in stool and nausea.  Genitourinary: Negative for dysuria and frequency.  Musculoskeletal: Negative for falls.  Skin: Negative for rash.  Neurological: Negative for dizziness, loss of consciousness and headaches.  Endo/Heme/Allergies: Negative for environmental allergies.  Psychiatric/Behavioral: Negative for depression. The patient is not nervous/anxious.        Objective:    Physical  Exam Constitutional:      Appearance: Normal appearance. He is not ill-appearing.  HENT:     Head: Normocephalic and atraumatic.     Right Ear: External ear normal.     Left Ear: External ear normal.     Nose: Nose normal.  Pulmonary:     Effort: Pulmonary effort is normal.  Neurological:     Mental Status: He is alert and oriented to person, place, and time.  Psychiatric:        Behavior: Behavior normal.     There were no vitals taken for this visit. Wt Readings from Last 3 Encounters:  04/28/19 229 lb (103.9 kg)  02/03/19 220 lb (99.8 kg)  11/24/18 220 lb (99.8 kg)    Diabetic Foot Exam - Simple   No data filed     Lab Results  Component Value Date   WBC 6.6 03/13/2019   HGB 13.6 03/13/2019   HCT 41.0 03/13/2019   PLT 204.0 03/13/2019   GLUCOSE 93 03/13/2019   CHOL 169 03/13/2019   TRIG 91.0 03/13/2019   HDL 40.50 03/13/2019   LDLDIRECT 168.5 03/29/2006   LDLCALC 110 (H) 03/13/2019   ALT 20 03/13/2019   AST 19 03/13/2019   NA 137 03/13/2019   K 4.5 03/13/2019   CL 103  03/13/2019   CREATININE 1.92 (H) 03/13/2019   BUN 37 (H) 03/13/2019   CO2 26 03/13/2019   TSH 4.73 (H) 03/13/2019   PSA 5.53 (H) 03/13/2019   INR 1.0 04/20/2018   HGBA1C 5.7 03/13/2019    Lab Results  Component Value Date   TSH 4.73 (H) 03/13/2019   Lab Results  Component Value Date   WBC 6.6 03/13/2019   HGB 13.6 03/13/2019   HCT 41.0 03/13/2019   MCV 91.3 03/13/2019   PLT 204.0 03/13/2019   Lab Results  Component Value Date   NA 137 03/13/2019   K 4.5 03/13/2019   CO2 26 03/13/2019   GLUCOSE 93 03/13/2019   BUN 37 (H) 03/13/2019   CREATININE 1.92 (H) 03/13/2019   BILITOT 0.6 03/13/2019   ALKPHOS 81 03/13/2019   AST 19 03/13/2019   ALT 20 03/13/2019   PROT 6.8 03/13/2019   ALBUMIN 4.1 03/13/2019   CALCIUM 10.3 03/13/2019   ANIONGAP 11 08/05/2018   GFR 34.42 (L) 03/13/2019   Lab Results  Component Value Date   CHOL 169 03/13/2019   Lab Results  Component Value  Date   HDL 40.50 03/13/2019   Lab Results  Component Value Date   LDLCALC 110 (H) 03/13/2019   Lab Results  Component Value Date   TRIG 91.0 03/13/2019   Lab Results  Component Value Date   CHOLHDL 4 03/13/2019   Lab Results  Component Value Date   HGBA1C 5.7 03/13/2019       Assessment & Plan:   Problem List Items Addressed This Visit    Hyperlipidemia, mixed - Primary   Relevant Medications   atenolol (TENORMIN) 25 MG tablet   Other Relevant Orders   Lipid panel   Essential hypertension, benign    Monitor and report any concerns.       Relevant Medications   atenolol (TENORMIN) 25 MG tablet   Other Relevant Orders   CBC   TSH   T4, free   Renal insufficiency    Hydrate and monitor      Relevant Orders   Comprehensive metabolic panel   Depression with anxiety    Doing well despite the stress of Prostate cancer diagnosis. No changes      Relevant Medications   LORazepam (ATIVAN) 1 MG tablet   Hyperglycemia    hgba1c acceptable, minimize simple carbs. Increase exercise as tolerated.       Relevant Orders   Hemoglobin A1c   Malignant neoplasm of prostate [facility])    Is following with Dr Alyson Ingles of [facility] and Dr Tammi Klippel. He is scheduled to have pellets placed soon      Relevant Medications   LORazepam (ATIVAN) 1 MG tablet   fluconazole (DIFLUCAN) 150 MG tablet      I have discontinued David [name] "Fred"'s Lotrisone and triamcinolone cream. I have also changed his atenolol. Additionally, I am having him start on triamcinolone cream and fluconazole. Lastly, I am having him maintain his (Menthol, Topical Analgesic, (ICY HOT BACK EX)), omeprazole, cyclobenzaprine, Colchicine, ketoconazole, ketoconazole, atorvastatin, ascorbic acid, FLUoxetine, and LORazepam.  Meds ordered this encounter  Medications  . triamcinolone cream (KENALOG) 0.1 %    Sig: Apply 1 application topically 2 (two) times daily as needed.    Dispense:  80 g    Refill:   1  . atenolol (TENORMIN) 25 MG tablet    Sig: Take 1 tablet (25 mg total) by mouth in the morning, at noon, and at bedtime.  Dispense:  270 tablet    Refill:  1  . fluconazole (DIFLUCAN) 150 MG tablet    Sig: Take 1 tablet (150 mg total) by mouth once a week.    Dispense:  2 tablet    Refill:  1     I discussed the assessment and treatment plan with the patient. The patient was provided an opportunity to ask questions and all were answered. The patient agreed with the plan and demonstrated an understanding of the instructions.   The patient was advised to call back or seek an in-person evaluation if the symptoms worsen or if the condition fails to improve as anticipated.  I provided 25 minutes of non-face-to-face time during this encounter.   Penni Homans, MD

## 2019-07-17 NOTE — Assessment & Plan Note (Signed)
Is following with Dr Alyson Ingles of [facility] and Dr Tammi Klippel. He is scheduled to have pellets placed soon

## 2019-07-20 ENCOUNTER — Encounter (HOSPITAL_BASED_OUTPATIENT_CLINIC_OR_DEPARTMENT_OTHER): Payer: Self-pay | Admitting: Urology

## 2019-07-20 ENCOUNTER — Other Ambulatory Visit: Payer: Self-pay

## 2019-07-20 NOTE — Progress Notes (Signed)
NEW Covid Policy July 3545  Surgery Day:  07-28-19  Facility:  [facility]  Type of Surgery: radioactive seed implant  Fully Covid Vaccinated:  Yes pt lost card Not Covid Vaccinated:  Needs covid test 07-25-19 1100 am  Do you have symptoms? no  In the past 14 days: no        Have you had any symptoms? no       Have you been tested covid positive? no       Have you been in contact with someone covid positive?no        Is pt Immuno-compromised?no

## 2019-07-20 NOTE — Progress Notes (Addendum)
Addendum: spoke with angela kabbe nop ok to proceed  Spoke w/ via phone for pre-op interview---pt Lab needs dos----   none           Lab results-----lab appt 07-25-19 at 1000- cbc, cmet, pt, ptt  COVID test ------7-13-210 1100 am pt lost covid vaccination card Arrive at -------530 am 07-28-19  No food after midnight clear liquids from midnight until 430 am then npo Medications to take morning of surgery -----atenolol, fluoxetine, lorazepam prn, atorvastatin Diabetic medication -----n/a Patient Special Instructions -----fleets enema am of surgery Pre-Op special Istructions -----none Patient verbalized understanding of instructions that were given at this phone interview. Patient denies shortness of breath, chest pain, fever, cough a this phone interview.  Anesthesia : hx of afib ( no blood thinner), incomplete rbbb and lefb  on ekg, lvh (moderate noted on echo), htn, renal insufficiency (right kidney removed). Pt denies cardiac S & S and denies sob at pre op call  Chart to kessica zanetto pa for review  PCP:dr stacey blyth 03-13-2019 epic lov Cardiologist :hochrein lov 10-31-2018 epic Chest x-ray :06-01-2019 epic EKG :06-01-2019 epic Echo :08-06-2017 epic Cardiac Cath : none Activity level:  Walks 6 to 8 miles 4 or 5 days per week, does housework can climb steps with out sob Sleep Study/ CPAP :none Fasting Blood Sugar :      / Checks Blood Sugar -- times a day:  none Blood Thinner/ Instructions /Last Dose:none ASA / Instructions/ Last Dose : none

## 2019-07-21 ENCOUNTER — Telehealth: Payer: Self-pay | Admitting: *Deleted

## 2019-07-21 ENCOUNTER — Other Ambulatory Visit (INDEPENDENT_AMBULATORY_CARE_PROVIDER_SITE_OTHER): Payer: Medicare Other

## 2019-07-21 ENCOUNTER — Encounter: Payer: Self-pay | Admitting: Family Medicine

## 2019-07-21 ENCOUNTER — Other Ambulatory Visit: Payer: Self-pay | Admitting: *Deleted

## 2019-07-21 DIAGNOSIS — N289 Disorder of kidney and ureter, unspecified: Secondary | ICD-10-CM

## 2019-07-21 DIAGNOSIS — I10 Essential (primary) hypertension: Secondary | ICD-10-CM

## 2019-07-21 DIAGNOSIS — R739 Hyperglycemia, unspecified: Secondary | ICD-10-CM

## 2019-07-21 DIAGNOSIS — E782 Mixed hyperlipidemia: Secondary | ICD-10-CM

## 2019-07-21 LAB — CBC
HCT: 40.5 % (ref 39.0–52.0)
Hemoglobin: 13.8 g/dL (ref 13.0–17.0)
MCHC: 34 g/dL (ref 30.0–36.0)
MCV: 91.2 fl (ref 78.0–100.0)
Platelets: 188 10*3/uL (ref 150.0–400.0)
RBC: 4.44 Mil/uL (ref 4.22–5.81)
RDW: 14.8 % (ref 11.5–15.5)
WBC: 6.2 10*3/uL (ref 4.0–10.5)

## 2019-07-21 LAB — COMPREHENSIVE METABOLIC PANEL
ALT: 21 U/L (ref 0–53)
AST: 19 U/L (ref 0–37)
Albumin: 4.3 g/dL (ref 3.5–5.2)
Alkaline Phosphatase: 82 U/L (ref 39–117)
BUN: 32 mg/dL — ABNORMAL HIGH (ref 6–23)
CO2: 26 mEq/L (ref 19–32)
Calcium: 10.1 mg/dL (ref 8.4–10.5)
Chloride: 106 mEq/L (ref 96–112)
Creatinine, Ser: 2.01 mg/dL — ABNORMAL HIGH (ref 0.40–1.50)
GFR: 32.62 mL/min — ABNORMAL LOW (ref >60.00)
Glucose, Bld: 98 mg/dL (ref 70–99)
Potassium: 4.4 mEq/L (ref 3.5–5.1)
Sodium: 139 mEq/L (ref 135–145)
Total Bilirubin: 0.5 mg/dL (ref 0.2–1.2)
Total Protein: 6.5 g/dL (ref 6.0–8.3)

## 2019-07-21 LAB — LIPID PANEL
Cholesterol: 225 mg/dL — ABNORMAL HIGH (ref 0–200)
HDL: 38.2 mg/dL — ABNORMAL LOW (ref >39.00)
LDL Cholesterol: 155 mg/dL — ABNORMAL HIGH (ref 0–99)
NonHDL: 186.7
Total CHOL/HDL Ratio: 6
Triglycerides: 159 mg/dL — ABNORMAL HIGH (ref 0.0–149.0)
VLDL: 31.8 mg/dL (ref 0.0–40.0)

## 2019-07-21 LAB — T4, FREE: Free T4: 0.88 ng/dL (ref 0.60–1.60)

## 2019-07-21 LAB — HEMOGLOBIN A1C: Hgb A1c MFr Bld: 5.6 % (ref 4.6–6.5)

## 2019-07-21 LAB — TSH: TSH: 3.25 u[IU]/mL (ref 0.35–4.50)

## 2019-07-21 MED ORDER — ATORVASTATIN CALCIUM 10 MG PO TABS: 10.0000 mg | ORAL_TABLET | Freq: Every morning | ORAL | 1 refills | Status: DC

## 2019-07-21 NOTE — Telephone Encounter (Signed)
CALLED PATIENT TO REMIND OF LAB AND COVID TESTING FOR 07-25-19, SPOKE WITH PATIENT AND IS AWARE OF THESE APPTS.

## 2019-07-25 ENCOUNTER — Encounter (HOSPITAL_COMMUNITY)
Admission: RE | Admit: 2019-07-25 | Discharge: 2019-07-25 | Disposition: A | Discharge status: Home / Self Care | Payer: Medicare Other | Source: Ambulatory Visit | Attending: Urology | Admitting: Urology

## 2019-07-25 ENCOUNTER — Other Ambulatory Visit (HOSPITAL_COMMUNITY)
Admission: RE | Admit: 2019-07-25 | Discharge: 2019-07-25 | Disposition: A | Discharge status: Home / Self Care | Payer: Medicare Other | Source: Ambulatory Visit | Attending: Urology | Admitting: Urology

## 2019-07-25 ENCOUNTER — Other Ambulatory Visit: Payer: Self-pay

## 2019-07-25 ENCOUNTER — Other Ambulatory Visit (HOSPITAL_COMMUNITY): Payer: Medicare Other

## 2019-07-25 DIAGNOSIS — I10 Essential (primary) hypertension: Secondary | ICD-10-CM | POA: Diagnosis not present

## 2019-07-25 DIAGNOSIS — C61 Malignant neoplasm of prostate: Secondary | ICD-10-CM | POA: Insufficient documentation

## 2019-07-25 DIAGNOSIS — Z01812 Encounter for preprocedural laboratory examination: Secondary | ICD-10-CM | POA: Insufficient documentation

## 2019-07-25 DIAGNOSIS — Z20822 Contact with and (suspected) exposure to covid-19: Secondary | ICD-10-CM | POA: Insufficient documentation

## 2019-07-25 DIAGNOSIS — K219 Gastro-esophageal reflux disease without esophagitis: Secondary | ICD-10-CM | POA: Diagnosis not present

## 2019-07-25 DIAGNOSIS — Z79899 Other long term (current) drug therapy: Secondary | ICD-10-CM | POA: Insufficient documentation

## 2019-07-25 DIAGNOSIS — I480 Paroxysmal atrial fibrillation: Secondary | ICD-10-CM | POA: Diagnosis not present

## 2019-07-25 DIAGNOSIS — Z85828 Personal history of other malignant neoplasm of skin: Secondary | ICD-10-CM | POA: Insufficient documentation

## 2019-07-25 LAB — COMPREHENSIVE METABOLIC PANEL
ALT: 24 U/L (ref 0–44)
AST: 22 U/L (ref 15–41)
Albumin: 4.1 g/dL (ref 3.5–5.0)
Alkaline Phosphatase: 69 U/L (ref 38–126)
Anion gap: 11 (ref 5–15)
BUN: 24 mg/dL — ABNORMAL HIGH (ref 8–23)
CO2: 23 mmol/L (ref 22–32)
Calcium: 9.6 mg/dL (ref 8.9–10.3)
Chloride: 104 mmol/L (ref 98–111)
Creatinine, Ser: 1.88 mg/dL — ABNORMAL HIGH (ref 0.61–1.24)
GFR calc Af Amer: 40 mL/min — ABNORMAL LOW (ref >60)
GFR calc non Af Amer: 34 mL/min — ABNORMAL LOW (ref >60)
Glucose, Bld: 83 mg/dL (ref 70–99)
Potassium: 4.4 mmol/L (ref 3.5–5.1)
Sodium: 138 mmol/L (ref 135–145)
Total Bilirubin: 0.8 mg/dL (ref 0.3–1.2)
Total Protein: 7 g/dL (ref 6.5–8.1)

## 2019-07-25 LAB — CBC
HCT: 42.3 % (ref 39.0–52.0)
Hemoglobin: 13.9 g/dL (ref 13.0–17.0)
MCH: 30.4 pg (ref 26.0–34.0)
MCHC: 32.9 g/dL (ref 30.0–36.0)
MCV: 92.6 fL (ref 80.0–100.0)
Platelets: 194 10*3/uL (ref 150–400)
RBC: 4.57 MIL/uL (ref 4.22–5.81)
RDW: 13.6 % (ref 11.5–15.5)
WBC: 6.2 10*3/uL (ref 4.0–10.5)
nRBC: 0 % (ref 0.0–0.2)

## 2019-07-25 LAB — PROTIME-INR
INR: 1 (ref 0.8–1.2)
Prothrombin Time: 12.6 seconds (ref 11.4–15.2)

## 2019-07-25 LAB — APTT: aPTT: 29 seconds (ref 24–36)

## 2019-07-25 LAB — SARS CORONAVIRUS 2 (TAT 6-24 HRS): SARS Coronavirus 2: NEGATIVE

## 2019-07-26 NOTE — Progress Notes (Signed)
Anesthesia Chart Review:  Pt is a same day work up    Case: [id number] Date/Time: 07/28/19 0715   Procedures:      RADIOACTIVE SEED IMPLANT/BRACHYTHERAPY IMPLANT (N/A ) - 90 MINS     SPACE OAR INSTILLATION (N/A )   Anesthesia type: General   Pre-op diagnosis: PROSTATE CANCER   Location: [facility] ROOM 3 / Ward   Surgeons: Cleon Gustin, MD      DISCUSSION: Pt is a 74 year old with hx PAF (not on anticoagulation due to hematuria, no recurrence or symptoms of afib), HTN, scoliosis, fatty liver, renal insufficiency (nonfunctioning R kidney, s/p R radical nephrectomy 08/04/18)   VS: BP (!) 145/78   Pulse (!) 57   Temp 36.6 C (Oral)   Resp 16   Ht 6' (1.829 m)   Wt 109.8 kg   SpO2 96%   BMI 32.82 kg/m    PROVIDERS: - PCP is Mosie Lukes, MD - Cardiologist is Minus Breeding, MD. Last office visit 10/31/18.    LABS: Labs reviewed: Acceptable for surgery.  Cr 1.88; this is consistent with prior results  (all labs ordered are listed, but only abnormal results are displayed)  Labs Reviewed  COMPREHENSIVE METABOLIC PANEL - Abnormal; Notable for the following components:      Result Value   BUN 24 (*)    Creatinine, Ser 1.88 (*)    GFR calc non Af Amer 34 (*)    GFR calc Af Amer 40 (*)    All other components within normal limits  APTT  CBC  PROTIME-INR    IMAGES: CXR 06/01/19: No active cardiopulmonary disease.   EKG 06/01/19: Sinus bradycardia. Left axis deviation. Incomplete RBBB   CV: Echo 08/06/17:  - Left ventricle: The cavity size was normal. Wall thickness was increased in a pattern of moderate LVH. Systolic function was normal. The estimated ejection fraction was in the range of 60% to 65%. Left ventricular diastolic function parameters were normal.  - Aortic valve: There was mild regurgitation.  - Right ventricle: The cavity size was mildly dilated.  - Atrial septum: No defect or patent foramen ovale was identified   Past Medical  History:  Diagnosis Date  . Adenoma of right adrenal gland 07/11/2002   2.4cm , noted on CT ABD  . Allergy   . Anxiety   . Arthritis of both knees 03/26/2016  . Astigmatism   . Back pain   . BCC (basal cell carcinoma of skin)    under right eye and right ear  . Blood transfusion without reported diagnosis   . Cataract 09/29/2016  . Depression   . Diverticulitis 2009  . Diverticulosis   . Fatty liver   . GERD (gastroesophageal reflux disease)   . Gout   . Hearing loss of both ears 03/26/2016  . History of atrial fibrillation    x 2 none since 2020  . History of chronic prostatitis    started at age 67  . History of kidney stones   . Hyperglycemia   . Hyperlipidemia   . Hypertension   . Incomplete right bundle branch block (RBBB)   . Internal hemorrhoids   . Kidney lesion 06/07/2015   Right midportion, 1.1x1.1 cm hyperechoic, noted on [place] ABD  . LAFB (left anterior fascicular block)   . Left anterior fascicular block 08/06/2017   Noted on EKG  . Lipoma of axilla 09/2016  . Liver lesion, right lobe 06/07/2015   2.5x2.4x2.4 cm  hypoechoic lesion posterior aspect, noted on [place] ABD  . Low back pain 03/26/2016  . LVH (left ventricular hypertrophy) 08/06/2017   Moderate, Noted on ECHO  . Medicare annual wellness visit, subsequent 03/12/2014  . Muscle cramps   . Nocturia   . OA (osteoarthritis)    Back, Hands, Neck  . Obesity 11/25/2007   Qualifier: Diagnosis of  By: Lenna Gilford MD, Deborra Medina   . Other malaise and fatigue 03/13/2013  . Penis symptom or sign    PENIS sensitive under urethra and itches  . Premature ventricular complex   . Prostate cancer ([facility]) dx 2021  . Renal insufficiency    Kidney removed right  . Scoliosis    Upper thoracic and lumbar  . Sinus bradycardia 08/06/2017   Noted on EKG  . Vitamin D deficiency    resolved    Past Surgical History:  Procedure Laterality Date  . CARDIOVERSION  07/31/2017  . CATARACT EXTRACTION, BILATERAL    . COLON SURGERY  2009    segmental sigmoid resection  . COLONOSCOPY    . CYSTOSCOPY W/ RETROGRADES Right 06/07/2018   Procedure: CYSTOSCOPY WITH RETROGRADE PYELOGRAM, uphrostogram;  Surgeon: Cleon Gustin, MD;  Location: [facility];  Service: Urology;  Laterality: Right;  . CYSTOSCOPY WITH URETEROSCOPY AND STENT PLACEMENT Right 02/28/2018   Procedure: CYSTOSCOPY WITH URETEROSCOPY [place];  Surgeon: Kathie Rhodes, MD;  Location: [facility];  Service: Urology;  Laterality: Right;  . CYSTOSCOPY/URETEROSCOPY/HOLMIUM LASER/STENT PLACEMENT Right 11/29/2017   Procedure: CYSTOSCOPY/RETROGRADE/URETEROSCOPY/HOLMIUM LASER/STENT PLACEMENT;  Surgeon: Kathie Rhodes, MD;  Location: [facility];  Service: Urology;  Laterality: Right;  . EYE SURGERY Bilateral 01/12/2017   cataract removal  . history of blood tranfusion  age 50  . HOLMIUM LASER APPLICATION Right 5/85/2778   Procedure: HOLMIUM LASER APPLICATION;  Surgeon: Kathie Rhodes, MD;  Location: [facility];  Service: Urology;  Laterality: Right;  . IR BALLOON DILATION URETERAL STRICTURE RIGHT  03/14/2018  . IR NEPHRO TUBE REMOV/FL  03/17/2018  . IR NEPHROSTOGRAM RIGHT THRU EXISTING ACCESS  03/17/2018  . IR NEPHROSTOMY EXCHANGE RIGHT  03/14/2018  . IR NEPHROSTOMY EXCHANGE RIGHT  06/21/2018  . IR NEPHROSTOMY PLACEMENT RIGHT  03/02/2018  . IR NEPHROSTOMY PLACEMENT RIGHT  04/20/2018  . IR URETERAL STENT PLACEMENT EXISTING ACCESS RIGHT  03/14/2018  . NOSE SURGERY     Submucous resection age 41  . NUCLEAR STRESS TEST  06/03/2009  . ROBOT ASSISTED LAPAROSCOPIC NEPHRECTOMY Right 08/04/2018   Procedure: XI ROBOTIC ASSISTED LAPAROSCOPIC NEPHRECTOMY;  Surgeon: Cleon Gustin, MD;  Location: [facility];  Service: Urology;  Laterality: Right;  3 HRS  . ROBOT ASSISTED PYELOPLASTY Right 06/07/2018   Procedure: attempted XI ROBOTIC ASSISTED PYELOPLASTY, lysis of adhesions;  Surgeon: Cleon Gustin, MD;  Location: [facility];  Service: Urology;  Laterality: Right;  3 HRS  .  SKIN BIOPSY    . URETEROSCOPY WITH HOLMIUM LASER LITHOTRIPSY Right 09/24/2017   Procedure: CYSTOSCOPY, URETEROSCOPY WITH HOLMIUM LASER LITHOTRIPSY, STENT PLACEMENT;  Surgeon: Kathie Rhodes, MD;  Location: [facility];  Service: Urology;  Laterality: Right;    MEDICATIONS: . ascorbic acid (VITAMIN C) 1000 MG tablet  . atenolol (TENORMIN) 25 MG tablet  . atorvastatin (LIPITOR) 10 MG tablet  . Colchicine 0.6 MG CAPS  . cyclobenzaprine (FLEXERIL) 5 MG tablet  . fluconazole (DIFLUCAN) 150 MG tablet  . FLUoxetine (PROZAC) 10 MG tablet  . ketoconazole (NIZORAL) 2 % cream  . ketoconazole (NIZORAL) 2 % shampoo  .  LORazepam (ATIVAN) 1 MG tablet  . Menthol, Topical Analgesic, (BIOFREEZE) 4 % GEL  . omeprazole (PRILOSEC) 20 MG capsule  . OVER THE COUNTER MEDICATION  . triamcinolone cream (KENALOG) 0.1 %   No current facility-administered medications for this encounter.    If no changes, I anticipate pt can proceed with surgery as scheduled.   Willeen Cass, PhD, FNP-BC [facility]/Anesthesiology Phone: [phone] 07/26/2019 12:49 PM

## 2019-07-26 NOTE — Anesthesia Preprocedure Evaluation (Addendum)
Anesthesia Evaluation  Patient identified by MRN, date of birth, ID band Patient awake    Reviewed: Allergy & Precautions, NPO status , Patient's Chart, lab work & pertinent test results, reviewed documented beta blocker date and time   Airway Mallampati: I       Dental no notable dental hx.    Pulmonary former smoker,    Pulmonary exam normal        Cardiovascular hypertension, Pt. on home beta blockers Normal cardiovascular exam     Neuro/Psych PSYCHIATRIC DISORDERS Anxiety Depression negative neurological ROS     GI/Hepatic GERD  Medicated and Controlled,  Endo/Other    Renal/GU Renal InsufficiencyRenal disease     Musculoskeletal   Abdominal (+) + obese,   Peds  Hematology   Anesthesia Other Findings  Sherran Needs, NP  08/06/2017 2:48 PM EDT   Please let pt know that echo looks very similar to last echo in 2012 other than the thickening of the pump chamber is now moderate, previously mild   Study Result                *[facility]*             [address], [place] [address]               [phone]   -------------------------------------------------------------------  Echocardiography   Patient:  David [name], David [name]  MR #:    [id number]  Study Date: 08/06/2017  Gender:   M  Age:    74  Height:   182.9 cm  Weight:   111.6 kg  BSA:    2.42 m^2  Pt. Status:  Room:   ATTENDING  Marvell Fuller  REFERRING  Sherran Needs  PERFORMING  [facility], Outpatient  SONOGRAPHER Jannett Celestine, RDCS   cc:   -------------------------------------------------------------------  LV EF: 60% -  65%   -------------------------------------------------------------------  History    Reproductive/Obstetrics                             Anesthesia Physical Anesthesia Plan  ASA: II  Anesthesia Plan: General   Post-op Pain Management:    Induction: Intravenous  PONV Risk Score and Plan: 4 or greater and Treatment may vary due to age or medical condition, Ondansetron and Dexamethasone  Airway Management Planned: LMA  Additional Equipment: None  Intra-op Plan:   Post-operative Plan:   Informed Consent: I have reviewed the patients History and Physical, chart, labs and discussed the procedure including the risks, benefits and alternatives for the proposed anesthesia with the patient or authorized representative who has indicated his/her understanding and acceptance.     Dental advisory given  Plan Discussed with: CRNA  Anesthesia Plan Comments: (See APP note by Durel Salts, FNP)       Anesthesia Quick Evaluation

## 2019-07-27 ENCOUNTER — Telehealth: Payer: Self-pay | Admitting: *Deleted

## 2019-07-27 MED ORDER — GENTAMICIN SULFATE 40 MG/ML IJ SOLN
5.0000 mg/kg | Freq: Once | INTRAVENOUS | Status: AC
  Administered 2019-07-28: 450 mg via INTRAVENOUS
  Filled 2019-07-27: qty 11.25

## 2019-07-27 NOTE — Telephone Encounter (Signed)
Called patient to remind of procedure for 07-28-19, spoke with patient and he is aware of this procedure

## 2019-07-28 ENCOUNTER — Ambulatory Visit (HOSPITAL_BASED_OUTPATIENT_CLINIC_OR_DEPARTMENT_OTHER): Payer: Medicare Other | Admitting: Emergency Medicine

## 2019-07-28 ENCOUNTER — Ambulatory Visit (HOSPITAL_BASED_OUTPATIENT_CLINIC_OR_DEPARTMENT_OTHER): Payer: Medicare Other | Admitting: Certified Registered"

## 2019-07-28 ENCOUNTER — Encounter (HOSPITAL_BASED_OUTPATIENT_CLINIC_OR_DEPARTMENT_OTHER): Payer: Self-pay | Admitting: Urology

## 2019-07-28 ENCOUNTER — Encounter (HOSPITAL_BASED_OUTPATIENT_CLINIC_OR_DEPARTMENT_OTHER)
Admission: RE | Disposition: A | Discharge status: Home / Self Care | Payer: Self-pay | Source: Other Acute Inpatient Hospital | Attending: Urology

## 2019-07-28 ENCOUNTER — Other Ambulatory Visit: Payer: Self-pay | Admitting: Urology

## 2019-07-28 ENCOUNTER — Ambulatory Visit (HOSPITAL_COMMUNITY): Payer: Medicare Other

## 2019-07-28 ENCOUNTER — Ambulatory Visit (HOSPITAL_BASED_OUTPATIENT_CLINIC_OR_DEPARTMENT_OTHER)
Admission: RE | Admit: 2019-07-28 | Discharge: 2019-07-28 | Disposition: A | Discharge status: Home / Self Care | Payer: Medicare Other | Source: Other Acute Inpatient Hospital | Attending: Urology | Admitting: Urology

## 2019-07-28 DIAGNOSIS — Z881 Allergy status to other antibiotic agents status: Secondary | ICD-10-CM | POA: Diagnosis not present

## 2019-07-28 DIAGNOSIS — N411 Chronic prostatitis: Secondary | ICD-10-CM | POA: Diagnosis not present

## 2019-07-28 DIAGNOSIS — K760 Fatty (change of) liver, not elsewhere classified: Secondary | ICD-10-CM | POA: Diagnosis not present

## 2019-07-28 DIAGNOSIS — F329 Major depressive disorder, single episode, unspecified: Secondary | ICD-10-CM | POA: Diagnosis not present

## 2019-07-28 DIAGNOSIS — I10 Essential (primary) hypertension: Secondary | ICD-10-CM | POA: Insufficient documentation

## 2019-07-28 DIAGNOSIS — Z807 Family history of other malignant neoplasms of lymphoid, hematopoietic and related tissues: Secondary | ICD-10-CM | POA: Insufficient documentation

## 2019-07-28 DIAGNOSIS — Z9049 Acquired absence of other specified parts of digestive tract: Secondary | ICD-10-CM | POA: Diagnosis not present

## 2019-07-28 DIAGNOSIS — Z6832 Body mass index (BMI) 32.0-32.9, adult: Secondary | ICD-10-CM | POA: Diagnosis not present

## 2019-07-28 DIAGNOSIS — Z833 Family history of diabetes mellitus: Secondary | ICD-10-CM | POA: Insufficient documentation

## 2019-07-28 DIAGNOSIS — I4510 Unspecified right bundle-branch block: Secondary | ICD-10-CM | POA: Insufficient documentation

## 2019-07-28 DIAGNOSIS — Z85828 Personal history of other malignant neoplasm of skin: Secondary | ICD-10-CM | POA: Insufficient documentation

## 2019-07-28 DIAGNOSIS — Z823 Family history of stroke: Secondary | ICD-10-CM | POA: Insufficient documentation

## 2019-07-28 DIAGNOSIS — Z87891 Personal history of nicotine dependence: Secondary | ICD-10-CM | POA: Diagnosis not present

## 2019-07-28 DIAGNOSIS — Z8349 Family history of other endocrine, nutritional and metabolic diseases: Secondary | ICD-10-CM | POA: Insufficient documentation

## 2019-07-28 DIAGNOSIS — Z87442 Personal history of urinary calculi: Secondary | ICD-10-CM | POA: Diagnosis not present

## 2019-07-28 DIAGNOSIS — F419 Anxiety disorder, unspecified: Secondary | ICD-10-CM | POA: Insufficient documentation

## 2019-07-28 DIAGNOSIS — C61 Malignant neoplasm of prostate: Secondary | ICD-10-CM | POA: Insufficient documentation

## 2019-07-28 DIAGNOSIS — E669 Obesity, unspecified: Secondary | ICD-10-CM | POA: Insufficient documentation

## 2019-07-28 DIAGNOSIS — R001 Bradycardia, unspecified: Secondary | ICD-10-CM | POA: Diagnosis not present

## 2019-07-28 DIAGNOSIS — M170 Bilateral primary osteoarthritis of knee: Secondary | ICD-10-CM | POA: Diagnosis not present

## 2019-07-28 DIAGNOSIS — Z905 Acquired absence of kidney: Secondary | ICD-10-CM | POA: Insufficient documentation

## 2019-07-28 DIAGNOSIS — Z9842 Cataract extraction status, left eye: Secondary | ICD-10-CM | POA: Insufficient documentation

## 2019-07-28 DIAGNOSIS — K219 Gastro-esophageal reflux disease without esophagitis: Secondary | ICD-10-CM | POA: Insufficient documentation

## 2019-07-28 DIAGNOSIS — Z8249 Family history of ischemic heart disease and other diseases of the circulatory system: Secondary | ICD-10-CM | POA: Insufficient documentation

## 2019-07-28 DIAGNOSIS — Z803 Family history of malignant neoplasm of breast: Secondary | ICD-10-CM | POA: Insufficient documentation

## 2019-07-28 DIAGNOSIS — E785 Hyperlipidemia, unspecified: Secondary | ICD-10-CM | POA: Insufficient documentation

## 2019-07-28 DIAGNOSIS — I444 Left anterior fascicular block: Secondary | ICD-10-CM | POA: Insufficient documentation

## 2019-07-28 DIAGNOSIS — Z880 Allergy status to penicillin: Secondary | ICD-10-CM | POA: Diagnosis not present

## 2019-07-28 DIAGNOSIS — Z809 Family history of malignant neoplasm, unspecified: Secondary | ICD-10-CM | POA: Insufficient documentation

## 2019-07-28 DIAGNOSIS — R739 Hyperglycemia, unspecified: Secondary | ICD-10-CM | POA: Insufficient documentation

## 2019-07-28 DIAGNOSIS — M419 Scoliosis, unspecified: Secondary | ICD-10-CM | POA: Insufficient documentation

## 2019-07-28 DIAGNOSIS — Z9841 Cataract extraction status, right eye: Secondary | ICD-10-CM | POA: Insufficient documentation

## 2019-07-28 DIAGNOSIS — Z888 Allergy status to other drugs, medicaments and biological substances status: Secondary | ICD-10-CM | POA: Insufficient documentation

## 2019-07-28 DIAGNOSIS — Z818 Family history of other mental and behavioral disorders: Secondary | ICD-10-CM | POA: Insufficient documentation

## 2019-07-28 DIAGNOSIS — Z836 Family history of other diseases of the respiratory system: Secondary | ICD-10-CM | POA: Insufficient documentation

## 2019-07-28 DIAGNOSIS — Z8261 Family history of arthritis: Secondary | ICD-10-CM | POA: Insufficient documentation

## 2019-07-28 HISTORY — DX: Malignant neoplasm of prostate: C61

## 2019-07-28 HISTORY — PX: CYSTOSCOPY: SHX5120

## 2019-07-28 HISTORY — PX: RADIOACTIVE SEED IMPLANT: SHX5150

## 2019-07-28 HISTORY — DX: Personal history of other diseases of the circulatory system: Z86.79

## 2019-07-28 HISTORY — PX: SPACE OAR INSTILLATION: SHX6769

## 2019-07-28 SURGERY — INSERTION, RADIATION SOURCE, PROSTATE
Anesthesia: General | Site: Rectum

## 2019-07-28 MED ORDER — OXYCODONE HCL 5 MG/5ML PO SOLN: 5.0000 mg | Freq: Once | ORAL | Status: AC | PRN

## 2019-07-28 MED ORDER — ACETAMINOPHEN 160 MG/5ML PO SOLN: 325.0000 mg | ORAL | Status: DC | PRN

## 2019-07-28 MED ORDER — FLEET ENEMA 7-19 GM/118ML RE ENEM: 1.0000 | ENEMA | Freq: Once | RECTAL | Status: DC

## 2019-07-28 MED ORDER — DEXAMETHASONE SODIUM PHOSPHATE 10 MG/ML IJ SOLN
INTRAMUSCULAR | Status: DC | PRN
Start: 2019-07-28 — End: 2019-07-28
  Administered 2019-07-28: 5 mg via INTRAVENOUS

## 2019-07-28 MED ORDER — TRAMADOL HCL 50 MG PO TABS: 50.0000 mg | ORAL_TABLET | Freq: Four times a day (QID) | ORAL | 0 refills | Status: AC | PRN

## 2019-07-28 MED ORDER — ONDANSETRON HCL 4 MG/2ML IJ SOLN
INTRAMUSCULAR | Status: DC | PRN
  Administered 2019-07-28: 4 mg via INTRAVENOUS

## 2019-07-28 MED ORDER — PROPOFOL 10 MG/ML IV BOLUS
INTRAVENOUS | Status: AC
  Filled 2019-07-28: qty 40

## 2019-07-28 MED ORDER — ACETAMINOPHEN 325 MG PO TABS: 325.0000 mg | ORAL_TABLET | ORAL | Status: DC | PRN

## 2019-07-28 MED ORDER — FENTANYL CITRATE (PF) 100 MCG/2ML IJ SOLN
INTRAMUSCULAR | Status: AC
  Filled 2019-07-28: qty 2

## 2019-07-28 MED ORDER — FENTANYL CITRATE (PF) 100 MCG/2ML IJ SOLN
25.0000 ug | INTRAMUSCULAR | Status: DC | PRN
  Administered 2019-07-28: 25 ug via INTRAVENOUS
  Administered 2019-07-28: 50 ug via INTRAVENOUS
  Administered 2019-07-28: 25 ug via INTRAVENOUS

## 2019-07-28 MED ORDER — ACETAMINOPHEN 10 MG/ML IV SOLN: 1000.0000 mg | Freq: Once | INTRAVENOUS | Status: DC | PRN

## 2019-07-28 MED ORDER — PROPOFOL 10 MG/ML IV BOLUS
INTRAVENOUS | Status: DC | PRN
  Administered 2019-07-28: 200 mg via INTRAVENOUS

## 2019-07-28 MED ORDER — FENTANYL CITRATE (PF) 250 MCG/5ML IJ SOLN
INTRAMUSCULAR | Status: DC | PRN
  Administered 2019-07-28: 25 ug via INTRAVENOUS
  Administered 2019-07-28: 50 ug via INTRAVENOUS

## 2019-07-28 MED ORDER — LIDOCAINE 2% (20 MG/ML) 5 ML SYRINGE
INTRAMUSCULAR | Status: DC | PRN
  Administered 2019-07-28: 100 mg via INTRAVENOUS

## 2019-07-28 MED ORDER — OXYCODONE-ACETAMINOPHEN 5-325 MG PO TABS: 1.0000 | ORAL_TABLET | ORAL | 0 refills | Status: DC | PRN

## 2019-07-28 MED ORDER — ONDANSETRON HCL 4 MG/2ML IJ SOLN: 4.0000 mg | Freq: Once | INTRAMUSCULAR | Status: DC | PRN

## 2019-07-28 MED ORDER — STERILE WATER FOR IRRIGATION IR SOLN
Status: DC | PRN
  Administered 2019-07-28: 3 mL

## 2019-07-28 MED ORDER — MEPERIDINE HCL 25 MG/ML IJ SOLN: 6.2500 mg | INTRAMUSCULAR | Status: DC | PRN

## 2019-07-28 MED ORDER — SODIUM CHLORIDE 0.9 % IV SOLN: INTRAVENOUS | Status: DC

## 2019-07-28 MED ORDER — OXYCODONE HCL 5 MG PO TABS
ORAL_TABLET | ORAL | Status: AC
  Filled 2019-07-28: qty 1

## 2019-07-28 MED ORDER — LIDOCAINE 2% (20 MG/ML) 5 ML SYRINGE
INTRAMUSCULAR | Status: AC
  Filled 2019-07-28: qty 5

## 2019-07-28 MED ORDER — OXYCODONE HCL 5 MG PO TABS
5.0000 mg | ORAL_TABLET | Freq: Once | ORAL | Status: AC | PRN
  Administered 2019-07-28: 5 mg via ORAL

## 2019-07-28 MED ORDER — IOHEXOL 300 MG/ML  SOLN
INTRAMUSCULAR | Status: DC | PRN
  Administered 2019-07-28: 7 mL

## 2019-07-28 SURGICAL SUPPLY — 35 items
BAG URINE DRAIN 2000ML AR STRL (UROLOGICAL SUPPLIES) ×12 IMPLANT
BLADE CLIPPER SENSICLIP SURGIC (BLADE) ×4 IMPLANT
CATH FOLEY 2WAY SLVR  5CC 16FR (CATHETERS) ×8
CATH FOLEY 2WAY SLVR 5CC 16FR (CATHETERS) ×6 IMPLANT
CATH ROBINSON RED A/P 20FR (CATHETERS) ×4 IMPLANT
CLOTH BEACON ORANGE TIMEOUT ST (SAFETY) ×4 IMPLANT
CNTNR URN SCR LID CUP LEK RST (MISCELLANEOUS) ×12 IMPLANT
CONT SPEC 4OZ STRL OR WHT (MISCELLANEOUS) ×16
COVER BACK TABLE 60X90IN (DRAPES) ×4 IMPLANT
COVER MAYO STAND STRL (DRAPES) ×4 IMPLANT
DRAPE U-SHAPE 47X51 STRL (DRAPES) ×4 IMPLANT
DRSG TEGADERM 4X4.75 (GAUZE/BANDAGES/DRESSINGS) ×8 IMPLANT
DRSG TEGADERM 8X12 (GAUZE/BANDAGES/DRESSINGS) ×8 IMPLANT
GAUZE SPONGE 4X4 12PLY STRL LF (GAUZE/BANDAGES/DRESSINGS) ×4 IMPLANT
GLOVE BIO SURGEON STRL SZ7.5 (GLOVE) IMPLANT
GLOVE BIO SURGEON STRL SZ8 (GLOVE) ×4 IMPLANT
GLOVE SURG ORTHO 8.5 STRL (GLOVE) ×4 IMPLANT
GLOVE SURG SS PI 6.5 STRL IVOR (GLOVE) IMPLANT
GOWN STRL REUS W/TWL XL LVL3 (GOWN DISPOSABLE) ×4 IMPLANT
HOLDER FOLEY CATH W/STRAP (MISCELLANEOUS) ×4 IMPLANT
I-Seed AgX100 ×4 IMPLANT
IMPL SPACEOAR VUE SYSTEM (Spacer) ×3 IMPLANT
IMPLANT SPACEOAR VUE SYSTEM (Spacer) ×4 IMPLANT
IV NS 1000ML (IV SOLUTION) ×4
IV NS 1000ML BAXH (IV SOLUTION) ×3 IMPLANT
KIT TURNOVER CYSTO (KITS) ×4 IMPLANT
MANIFOLD NEPTUNE II (INSTRUMENTS) IMPLANT
MARKER SKIN DUAL TIP RULER LAB (MISCELLANEOUS) ×4 IMPLANT
PACK CYSTO (CUSTOM PROCEDURE TRAY) ×4 IMPLANT
SURGILUBE 2OZ TUBE FLIPTOP (MISCELLANEOUS) ×4 IMPLANT
SYR 10ML LL (SYRINGE) ×8 IMPLANT
TOWEL OR 17X26 10 PK STRL BLUE (TOWEL DISPOSABLE) ×8 IMPLANT
UNDERPAD 30X30 (UNDERPADS AND DIAPERS) ×8 IMPLANT
WATER STERILE IRR 3000ML UROMA (IV SOLUTION) ×4 IMPLANT
WATER STERILE IRR 500ML POUR (IV SOLUTION) ×4 IMPLANT

## 2019-07-28 NOTE — Anesthesia Postprocedure Evaluation (Signed)
Anesthesia Post Note  Patient: David [name]  Procedure(s) Performed: RADIOACTIVE SEED IMPLANT/BRACHYTHERAPY IMPLANT (N/A Prostate) SPACE OAR INSTILLATION (N/A Rectum) CYSTOSCOPY FLEXIBLE (N/A Bladder)     Patient location during evaluation: Phase II Anesthesia Type: General Level of consciousness: awake Pain management: pain level controlled Vital Signs Assessment: post-procedure vital signs reviewed and stable Respiratory status: spontaneous breathing Cardiovascular status: stable Postop Assessment: no apparent nausea or vomiting Anesthetic complications: no   No complications documented.  Last Vitals:  Vitals:   07/28/19 1015 07/28/19 1030  BP: (!) 142/75 98/74  Pulse: (!) 56 (!) 58  Resp: (!) 6 12  Temp:  (!) 36.3 C  SpO2: 96% 98%    Last Pain:  Vitals:   07/28/19 1015  TempSrc:   PainSc: 4                  John F Artist Bloom Jr

## 2019-07-28 NOTE — Op Note (Signed)
PRE-OPERATIVE DIAGNOSIS:  Adenocarcinoma of the prostate  POST-OPERATIVE DIAGNOSIS:  Same  PROCEDURE:  Procedure(s): 1. I-125 radioactive seed implantation 2. Cystoscopy 3. Placement of SpaceOAR  SURGEON:  Surgeon(s): Nicolette Bang, MD  Radiation oncologist: Tyler Pita, MD  ANESTHESIA:  General  EBL:  Minimal  DRAINS: 39 French Foley catheter  INDICATION: David [name] is a 74 year old with a history of T1c prostate cancer. After discussing treatment options he has elected to proceed with brachytherapy  Description of procedure: After informed consent the patient was brought to the major OR, placed on the table and administered general anesthesia. He was then moved to the modified lithotomy position with his perineum perpendicular to the floor. His perineum and genitalia were then sterilely prepped. An official timeout was then performed. A 16 French Foley catheter was then placed in the bladder and filled with dilute contrast, a rectal tube was placed in the rectum and the transrectal ultrasound probe was placed in the rectum and affixed to the stand. He was then sterilely draped.  Real time ultrasonography was used along with the seed planning software [employer] Prostate vs. 4.2.21. This was used to develop the seed plan including the number of needles as well as number of seeds required for complete and adequate coverage. Real-time ultrasonography was then used along with the previously developed plan and the Nucletron device to implant a total of 99 seeds using 22 needles. This proceeded without difficulty or complication.   We then proceeded to mix the SpaceOAR using the kit supplied from the manufacturer. Once this was complete we placed a sinal needle into the perirectal fat between the rectum and the prostate. Once this was accomplished we injected 2cc of normal saline to hydrodissect the plain. We then instilled the the SpaceOAR through the spinal needle and noted good  distribution in the perirectal fat.     A Foley catheter was then removed as well as the transrectal ultrasound probe and rectal probe. Flexible cystoscopy was then performed using the 17 French flexible scope which revealed a normal urethra throughout its length down to the sphincter which appeared intact. The prostatic urethra revealed bilobar hypertrophy but no evidence of obstruction, seeds, spacers or lesions. The bladder was then entered and fully and systematically inspected. The ureteral orifices were noted to be of normal configuration and position. The mucosa revealed no evidence of tumors. There were also no stones identified within the bladder. I noted no seeds or spacers on the floor of the bladder and retroflexion of the scope revealed no seeds protruding from the base of the prostate.  The cystoscope was then removed and a new 75 French Foley catheter was then inserted and the balloon was filled with 10 cc of sterile water. This was connected to closed system drainage and the patient was awakened and taken to recovery room in stable and satisfactory condition. He tolerated procedure well and there were no intraoperative complications.

## 2019-07-28 NOTE — Discharge Instructions (Signed)
Indwelling Urinary Catheter Care, Adult An indwelling urinary catheter is a thin tube that is put into your bladder. The tube helps to drain pee (urine) out of your body. The tube goes in through your urethra. Your urethra is where pee comes out of your body. Your pee will come out through the catheter, then it will go into a bag (drainage bag). Take good care of your catheter so it will work well. How to wear your catheter and bag Supplies needed  Sticky tape (adhesive tape) or a leg strap.  Alcohol wipe or soap and water (if you use tape).  A clean towel (if you use tape).  Large overnight bag.  Smaller bag (leg bag). Wearing your catheter Attach your catheter to your leg with tape or a leg strap.  Make sure the catheter is not pulled tight.  If a leg strap gets wet, take it off and put on a dry strap.  If you use tape to hold the bag on your leg: 1. Use an alcohol wipe or soap and water to wash your skin where the tape made it sticky before. 2. Use a clean towel to pat-dry that skin. 3. Use new tape to make the bag stay on your leg. Wearing your bags You should have been given a large overnight bag.  You may wear the overnight bag in the day or night.  Always have the overnight bag lower than your bladder.  Do not let the bag touch the floor.  Before you go to sleep, put a clean plastic bag in a wastebasket. Then hang the overnight bag inside the wastebasket. You should also have a smaller leg bag that fits under your clothes.  Always wear the leg bag below your knee.  Do not wear your leg bag at night. How to care for your skin and catheter Supplies needed  A clean washcloth.  Water and mild soap.  A clean towel. Caring for your skin and catheter      Clean the skin around your catheter every day: 1. Wash your hands with soap and water. 2. Wet a clean washcloth in warm water and mild soap. 3. Clean the skin around your urethra.  If you are  male:  Gently spread the folds of skin around your vagina (labia).  With the washcloth in your other hand, wipe the inner side of your labia on each side. Wipe from front to back.  If you are male:  Pull back any skin that covers the end of your penis (foreskin).  With the washcloth in your other hand, wipe your penis in small circles. Start wiping at the tip of your penis, then move away from the catheter.  Move the foreskin back in place, if needed. 4. With your free hand, hold the catheter close to where it goes into your body.  Keep holding the catheter during cleaning so it does not get pulled out. 5. With the washcloth in your other hand, clean the catheter.  Only wipe downward on the catheter.  Do not wipe upward toward your body. Doing this may push germs into your urethra and cause infection. 6. Use a clean towel to pat-dry the catheter and the skin around it. Make sure to wipe off all soap. 7. Wash your hands with soap and water.  Shower every day. Do not take baths.  Do not use cream, ointment, or lotion on the area where the catheter goes into your body, unless your doctor tells you   to.  Do not use powders, sprays, or lotions on your genital area.  Check your skin around the catheter every day for signs of infection. Check for: ? Redness, swelling, or pain. ? Fluid or blood. ? Warmth. ? Pus or a bad smell. How to empty the bag Supplies needed  Rubbing alcohol.  Gauze pad or cotton ball.  Tape or a leg strap. Emptying the bag Pour the pee out of your bag when it is ?- full, or at least 2-3 times a day. Do this for your overnight bag and your leg bag. 1. Wash your hands with soap and water. 2. Separate (detach) the bag from your leg. 3. Hold the bag over the toilet or a clean pail. Keep the bag lower than your hips and bladder. This is so the pee (urine) does not go back into the tube. 4. Open the pour spout. It is at the bottom of the bag. 5. Empty the  pee into the toilet or pail. Do not let the pour spout touch any surface. 6. Put rubbing alcohol on a gauze pad or cotton ball. 7. Use the gauze pad or cotton ball to clean the pour spout. 8. Close the pour spout. 9. Attach the bag to your leg with tape or a leg strap. 10. Wash your hands with soap and water. Follow instructions for cleaning the drainage bag:  From the product maker.  As told by your doctor. How to change the bag Supplies needed  Alcohol wipes.  A clean bag.  Tape or a leg strap. Changing the bag Replace your bag when it starts to leak, smell bad, or look dirty. 1. Wash your hands with soap and water. 2. Separate the dirty bag from your leg. 3. Pinch the catheter with your fingers so that pee does not spill out. 4. Separate the catheter tube from the bag tube where these tubes connect (at the connection valve). Do not let the tubes touch any surface. 5. Clean the end of the catheter tube with an alcohol wipe. Use a different alcohol wipe to clean the end of the bag tube. 6. Connect the catheter tube to the tube of the clean bag. 7. Attach the clean bag to your leg with tape or a leg strap. Do not make the bag tight on your leg. 8. Wash your hands with soap and water. General rules   Never pull on your catheter. Never try to take it out. Doing that can hurt you.  Always wash your hands before and after you touch your catheter or bag. Use a mild, fragrance-free soap. If you do not have soap and water, use hand sanitizer.  Always make sure there are no twists or bends (kinks) in the catheter tube.  Always make sure there are no leaks in the catheter or bag.  Drink enough fluid to keep your pee pale yellow.  Do not take baths, swim, or use a hot tub.  If you are male, wipe from front to back after you poop (have a bowel movement). Contact a doctor if:  Your pee is cloudy.  Your pee smells worse than usual.  Your catheter gets clogged.  Your catheter  leaks.  Your bladder feels full. Get help right away if:  You have redness, swelling, or pain where the catheter goes into your body.  You have fluid, blood, pus, or a bad smell coming from the area where the catheter goes into your body.  Your skin feels warm where   the catheter goes into your body.  You have a fever.  You have pain in your: ? Belly (abdomen). ? Legs. ? Lower back. ? Bladder.  You see blood in the catheter.  Your pee is pink or red.  You feel sick to your stomach (nauseous).  You throw up (vomit).  You have chills.  Your pee is not draining into the bag.  Your catheter gets pulled out. Summary  An indwelling urinary catheter is a thin tube that is placed into the bladder to help drain pee (urine) out of the body.  The catheter is placed into the part of the body that drains pee from the bladder (urethra).  Taking good care of your catheter will keep it working properly and help prevent problems.  Always wash your hands before and after touching your catheter or bag.  Never pull on your catheter or try to take it out. This information is not intended to replace advice given to you by your health care provider. Make sure you discuss any questions you have with your health care provider. Document Revised: 04/22/2018 Document Reviewed: 08/14/2016 Elsevier Patient Education  [place] Instructions  Activity: Get plenty of rest for the remainder of the day. A responsible individual must stay with you for 24 hours following the procedure.  For the next 24 hours, DO NOT: -Drive a car -Paediatric nurse -Drink alcoholic beverages -Take any medication unless instructed by your physician -Make any legal decisions or sign important papers.  Meals: Start with liquid foods such as gelatin or soup. Progress to regular foods as tolerated. Avoid greasy, spicy, heavy foods. If nausea and/or vomiting occur, drink only  clear liquids until the nausea and/or vomiting subsides. Call your physician if vomiting continues.  Special Instructions/Symptoms: Your throat may feel dry or sore from the anesthesia or the breathing tube placed in your throat during surgery. If this causes discomfort, gargle with warm salt water. The discomfort should disappear within 24 hours.  If you had a scopolamine patch placed behind your ear for the management of post- operative nausea and/or vomiting:  1. The medication in the patch is effective for 72 hours, after which it should be removed.  Wrap patch in a tissue and discard in the trash. Wash hands thoroughly with soap and water. 2. You may remove the patch earlier than 72 hours if you experience unpleasant side effects which may include dry mouth, dizziness or visual disturbances. 3. Avoid touching the patch. Wash your hands with soap and water after contact with the patch.    Radioactive Seed Implant Home Care Instructions   Activity:    Rest for the remainder of the day.  Do not drive or operate equipment today.  You may resume normal  activities in a few days as instructed by your physician, without risk of harmful radiation exposure to those around you, provided you follow the time and distance precautions on the Radiation Oncology Instruction Sheet.   Meals: Drink plenty of lipuids and eat light foods, such as gelatin or soup this evening .  You may return to normal meal plan tomorrow.  Return To Work: You may return to work as instructed by [employer].  Special Instruction:   If any seeds are found, use tweezers to pick up seeds and place in a glass container of any kind and bring to your physician's office.  Call your physician if any of these symptoms occur:  Persistent or heavy bleeding  Urine stream diminishes or stops completely after catheter is removed  Fever equal to or greater than 101 degrees F  Cloudy urine with a strong foul odor  Severe  pain  You may feel some burning pain and/or hesitancy when you urinate after the catheter is removed.  These symptoms may increase over the next few weeks, but should diminish within forur to six weeks.  Applying moist heat to the lower abdomen or a hot tub bath may help relieve the pain.  If the discomfort becomes severe, please call your physician for additional medications.

## 2019-07-28 NOTE — Transfer of Care (Signed)
Immediate Anesthesia Transfer of Care Note  Patient: David [name]  Procedure(s) Performed: RADIOACTIVE SEED IMPLANT/BRACHYTHERAPY IMPLANT (N/A Prostate) SPACE OAR INSTILLATION (N/A Rectum) CYSTOSCOPY FLEXIBLE (N/A Bladder)  Patient Location: PACU  Anesthesia Type:General  Level of Consciousness: awake, alert , oriented and patient cooperative  Airway & Oxygen Therapy: Patient Spontanous Breathing and Patient connected to face mask oxygen  Post-op Assessment: Report given to RN, Post -op Vital signs reviewed and stable and Patient moving all extremities  Post vital signs: Reviewed and stable  Last Vitals:  Vitals Value Taken Time  BP    Temp    Pulse 59 07/28/19 0923  Resp 10 07/28/19 0923  SpO2 100 % 07/28/19 0923  Vitals shown include unvalidated device data.  Last Pain:  Vitals:   07/28/19 0602  TempSrc: Oral  PainSc: 0-No pain      Patients Stated Pain Goal: 3 (28/83/37 4451)  Complications: No complications documented.

## 2019-07-28 NOTE — H&P (Signed)
Urology Admission H&P  Chief Complaint: Prostate cancer  History of Present Illness: David [name] is a 74yo here for brachytherapy for prostate cancer. No significant LUTS. No hematuria.   Past Medical History:  Diagnosis Date  . Adenoma of right adrenal gland 07/11/2002   2.4cm , noted on CT ABD  . Allergy   . Anxiety   . Arthritis of both knees 03/26/2016  . Astigmatism   . Back pain   . BCC (basal cell carcinoma of skin)    under right eye and right ear  . Blood transfusion without reported diagnosis   . Cataract 09/29/2016  . Depression   . Diverticulitis 2009  . Diverticulosis   . Fatty liver   . GERD (gastroesophageal reflux disease)   . Gout   . Hearing loss of both ears 03/26/2016  . History of atrial fibrillation    x 2 none since 2020  . History of chronic prostatitis    started at age 31  . History of kidney stones   . Hyperglycemia   . Hyperlipidemia   . Hypertension   . Incomplete right bundle branch block (RBBB)   . Internal hemorrhoids   . Kidney lesion 06/07/2015   Right midportion, 1.1x1.1 cm hyperechoic, noted on [place] ABD  . LAFB (left anterior fascicular block)   . Left anterior fascicular block 08/06/2017   Noted on EKG  . Lipoma of axilla 09/2016  . Liver lesion, right lobe 06/07/2015   2.5x2.4x2.4 cm hypoechoic lesion posterior aspect, noted on [place] ABD  . Low back pain 03/26/2016  . LVH (left ventricular hypertrophy) 08/06/2017   Moderate, Noted on ECHO  . Medicare annual wellness visit, subsequent 03/12/2014  . Muscle cramps   . Nocturia   . OA (osteoarthritis)    Back, Hands, Neck  . Obesity 11/25/2007   Qualifier: Diagnosis of  By: Lenna Gilford MD, Deborra Medina   . Other malaise and fatigue 03/13/2013  . Penis symptom or sign    PENIS sensitive under urethra and itches  . Premature ventricular complex   . Prostate cancer ([place]) dx 2021  . Renal insufficiency    Kidney removed right  . Scoliosis    Upper thoracic and lumbar  . Sinus bradycardia 08/06/2017    Noted on EKG  . Vitamin D deficiency    resolved   Past Surgical History:  Procedure Laterality Date  . CARDIOVERSION  07/31/2017  . CATARACT EXTRACTION, BILATERAL    . COLON SURGERY  2009   segmental sigmoid resection  . COLONOSCOPY    . CYSTOSCOPY W/ RETROGRADES Right 06/07/2018   Procedure: CYSTOSCOPY WITH RETROGRADE PYELOGRAM, uphrostogram;  Surgeon: Cleon Gustin, MD;  Location: [facility];  Service: Urology;  Laterality: Right;  . CYSTOSCOPY WITH URETEROSCOPY AND STENT PLACEMENT Right 02/28/2018   Procedure: CYSTOSCOPY WITH URETEROSCOPY [place];  Surgeon: Kathie Rhodes, MD;  Location[facility];  Service: Urology;  Laterality: Right;  . CYSTOSCOPY/URETEROSCOPY/HOLMIUM LASER/STENT PLACEMENT Right 11/29/2017   Procedure: CYSTOSCOPY/RETROGRADE/URETEROSCOPY/HOLMIUM LASER/STENT PLACEMENT;  Surgeon: Kathie Rhodes, MD;  Location: [facility];  Service: Urology;  Laterality: Right;  . EYE SURGERY Bilateral 01/12/2017   cataract removal  . history of blood tranfusion  age 70  . HOLMIUM LASER APPLICATION Right 08/21/9831   Procedure: HOLMIUM LASER APPLICATION;  Surgeon: Kathie Rhodes, MD;  Location: [facility];  Service: Urology;  Laterality: Right;  . IR BALLOON DILATION URETERAL STRICTURE RIGHT  03/14/2018  . IR NEPHRO TUBE REMOV/FL  03/17/2018  . IR  NEPHROSTOGRAM RIGHT THRU EXISTING ACCESS  03/17/2018  . IR NEPHROSTOMY EXCHANGE RIGHT  03/14/2018  . IR NEPHROSTOMY EXCHANGE RIGHT  06/21/2018  . IR NEPHROSTOMY PLACEMENT RIGHT  03/02/2018  . IR NEPHROSTOMY PLACEMENT RIGHT  04/20/2018  . IR URETERAL STENT PLACEMENT EXISTING ACCESS RIGHT  03/14/2018  . NOSE SURGERY     Submucous resection age 8  . NUCLEAR STRESS TEST  06/03/2009  . ROBOT ASSISTED LAPAROSCOPIC NEPHRECTOMY Right 08/04/2018   Procedure: XI ROBOTIC ASSISTED LAPAROSCOPIC NEPHRECTOMY;  Surgeon: Cleon Gustin, MD;  Location: [facility];  Service: Urology;  Laterality: Right;  3 HRS  . ROBOT ASSISTED PYELOPLASTY  Right 06/07/2018   Procedure: attempted XI ROBOTIC ASSISTED PYELOPLASTY, lysis of adhesions;  Surgeon: Cleon Gustin, MD;  Location: [facility];  Service: Urology;  Laterality: Right;  3 HRS  . SKIN BIOPSY    . URETEROSCOPY WITH HOLMIUM LASER LITHOTRIPSY Right 09/24/2017   Procedure: CYSTOSCOPY, URETEROSCOPY WITH HOLMIUM LASER LITHOTRIPSY, STENT PLACEMENT;  Surgeon: Kathie Rhodes, MD;  Location: [facility];  Service: Urology;  Laterality: Right;    Home Medications:  Current Facility-Administered Medications  Medication Dose Route Frequency Provider Last Rate Last Admin  . 0.9 %  sodium chloride infusion   Intravenous Continuous Ellender, Karyl Kinnier, MD 50 mL/hr at 07/28/19 4098 Continued from Pre-op at 07/28/19 0652  . gentamicin (GARAMYCIN) 450 mg in dextrose 5 % 100 mL IVPB  5 mg/kg (Adjusted) Intravenous Once Cleon Gustin, MD      . Derrill Memo ON 07/29/2019] sodium phosphate (FLEET) 7-19 GM/118ML enema 1 enema  1 enema Rectal Once Alyson Ingles Candee Furbish, MD       Allergies:  Allergies  Allergen Reactions  . Cefaclor Hives  . Cephalosporins Hives  . Penicillins Rash    Mild maculopapular rash Has patient had a PCN reaction causing immediate rash, facial/tongue/throat swelling, SOB or lightheadedness with hypotension: No Has patient had a PCN reaction causing severe rash involving mucus membranes or skin necrosis: No Has patient had a PCN reaction that required hospitalization: No Has patient had a PCN reaction occurring within the last 10 years: No If all of the above answers are "NO", then may proceed with Cephalosporin use.     Family History  Problem Relation Age of Onset  . Heart disease Mother   . Hypertension Mother   . Stroke Mother   . Colon cancer Mother   . Breast cancer Mother   . Heart disease Father        pacemaker  . Aortic aneurysm Father   . Hypertension Father   . Heart disease Sister   . Atrial fibrillation Sister   . Obesity Sister   .  Sleep apnea Sister   . Heart attack Brother   . Other Brother        muscle disease  . Arthritis Brother   . Stroke Brother   . Atrial fibrillation Brother   . Cancer Maternal Grandmother        ?  Marland Kitchen Heart attack Maternal Grandmother   . Diabetes Maternal Grandmother   . Cancer Maternal Grandfather        hodgin's lymphoma  . Heart attack Paternal Grandmother   . Anxiety disorder Paternal Grandmother   . Pneumonia Paternal Grandfather   . Heart attack Brother   . Atrial fibrillation Brother   . Atrial fibrillation Brother   . Heart attack Brother   . Hypertension Brother   . Hyperlipidemia Brother   . Heart  attack Brother   . Other Brother        heart valve operation  . Atrial fibrillation Brother   . Esophageal cancer Neg Hx   . Rectal cancer Neg Hx   . Stomach cancer Neg Hx    Social History:  reports that he quit smoking about 50 years ago. His smoking use included cigarettes. He started smoking about 50 years ago. He has a 6.00 pack-year smoking history. He has never used smokeless tobacco. He reports current alcohol use. He reports that he does not use drugs.  Review of Systems  All other systems reviewed and are negative.   Physical Exam:  Vital signs in last 24 hours: Temp:  [98.4 F (36.9 C)] 98.4 F (36.9 C) (07/16 0602) Pulse Rate:  [54] 54 (07/16 0602) Resp:  [18] 18 (07/16 0602) BP: (138)/(92) 138/92 (07/16 0602) SpO2:  [96 %] 96 % (07/16 0602) Weight:  [109.9 kg] 109.9 kg (07/16 0602) Physical Exam Constitutional:      Appearance: Normal appearance.  HENT:     Head: Normocephalic and atraumatic.     Mouth/Throat:     Mouth: Mucous membranes are dry.  Eyes:     Extraocular Movements: Extraocular movements intact.     Pupils: Pupils are equal, round, and reactive to light.  Cardiovascular:     Rate and Rhythm: Normal rate and regular rhythm.  Pulmonary:     Effort: Pulmonary effort is normal. No respiratory distress.  Abdominal:     General:  Abdomen is flat. There is no distension.  Musculoskeletal:        General: Normal range of motion.     Cervical back: Normal range of motion and neck supple.  Skin:    General: Skin is warm and dry.  Neurological:     General: No focal deficit present.     Mental Status: He is alert and oriented to person, place, and time.  Psychiatric:        Mood and Affect: Mood normal.        Behavior: Behavior normal.        Thought Content: Thought content normal.        Judgment: Judgment normal.     Laboratory Data:  No results found for this or any previous visit (from the past 24 hour(s)). Recent Results (from the past 240 hour(s))  SARS CORONAVIRUS 2 (TAT 6-24 HRS) Nasopharyngeal Nasopharyngeal Swab     Status: None   Collection Time: 07/25/19 10:28 AM   Specimen: Nasopharyngeal Swab  Result Value Ref Range Status   SARS Coronavirus 2 NEGATIVE NEGATIVE Final    Comment: (NOTE) SARS-CoV-2 target nucleic acids are NOT DETECTED.  The SARS-CoV-2 RNA is generally detectable in upper and lower respiratory specimens during the acute phase of infection. Negative results do not preclude SARS-CoV-2 infection, do not rule out co-infections with other pathogens, and should not be used as the sole basis for treatment or other patient management decisions. Negative results must be combined with clinical observations, patient history, and epidemiological information. The expected result is Negative.  Fact Sheet for Patients: SugarRoll.be  Fact Sheet for Healthcare Providers: https://www.woods-mathews.com/  This test is not yet approved or cleared by the [place] FDA and  has been authorized for detection and/or diagnosis of SARS-CoV-2 by FDA under an Emergency Use Authorization (EUA). This EUA will remain  in effect (meaning this test can be used) for the duration of the COVID-19 declaration under Se ction 564(b)(1) of the  Act, 21 U.S.C. section  360bbb-3(b)(1), unless the authorization is terminated or revoked sooner.  Performed at [facility], [place] [address], [place], [place] [address]    Creatinine: Recent Labs    07/21/19 0845 07/25/19 0951  CREATININE 2.01* 1.88*   Baseline Creatinine: 1.9  Impression/Assessment:  74yo with prostate cancer  Plan:  The risks/benefits/alterantives to brachytherapy with SpaceOAR was explained to the patient and he understands and wishes to proceed with surgery  Nicolette Bang 07/28/2019, 7:38 AM

## 2019-07-28 NOTE — Anesthesia Procedure Notes (Signed)
Procedure Name: LMA Insertion Date/Time: 07/28/2019 8:15 AM Performed by: Myna Bright, CRNA Pre-anesthesia Checklist: Patient identified, Emergency Drugs available, Suction available and Patient being monitored Patient Re-evaluated:Patient Re-evaluated prior to induction Oxygen Delivery Method: Circle system utilized Preoxygenation: Pre-oxygenation with 100% oxygen Induction Type: IV induction Ventilation: Mask ventilation without difficulty LMA: LMA inserted LMA Size: 5.0 Number of attempts: 1 Placement Confirmation: positive ETCO2 and breath sounds checked- equal and bilateral Tube secured with: Tape Dental Injury: Teeth and Oropharynx as per pre-operative assessment

## 2019-07-30 NOTE — Progress Notes (Signed)
  Radiation Oncology         [phone] ________________________________  Name: David [name] MRN: [id number]  Date: 07/30/2019  DOB: November 23, 1945       Prostate Seed Implant  MM:NOTRR, Bonnita Levan, MD  No ref. provider found  DIAGNOSIS:  Oncology History  Malignant neoplasm of prostate ([place])  03/28/2019 Cancer Staging   Staging form: Prostate, AJCC 8th Edition - Clinical stage from 03/28/2019: Stage IIC (cT1c, cN0, cM0, PSA: 5.8, Grade Group: 3) - Signed by Freeman Caldron, PA-C on 04/28/2019   04/28/2019 Initial Diagnosis   Malignant neoplasm of prostate ([place])     No diagnosis found.  PROCEDURE: Insertion of radioactive I-125 seeds into the prostate gland.  RADIATION DOSE: 145 Gy, definitive therapy.  TECHNIQUE: David [name] was brought to the operating room with the urologist. He was placed in the dorsolithotomy position. He was catheterized and a rectal tube was inserted. The perineum was shaved, prepped and draped. The ultrasound probe was then introduced into the rectum to see the prostate gland.  TREATMENT DEVICE: A needle grid was attached to the ultrasound probe stand and anchor needles were placed.  3D PLANNING: The prostate was imaged in 3D using a sagittal sweep of the prostate probe. These images were transferred to the planning computer. There, the prostate, urethra and rectum were defined on each axial reconstructed image. Then, the software created an optimized 3D plan and a few seed positions were adjusted. The quality of the plan was reviewed using [facility] information for the target and the following two organs at risk:  Urethra and Rectum.  Then the accepted plan was printed and handed off to the radiation therapist.  Under my supervision, the custom loading of the seeds and spacers was carried out and loaded into sealed vicryl sleeves.  These pre-loaded needles were then placed into the needle holder.Marland Kitchen  PROSTATE VOLUME STUDY:  Using transrectal ultrasound the volume  of the prostate was verified to be 49 cc.  SPECIAL TREATMENT PROCEDURE/SUPERVISION AND HANDLING: The pre-loaded needles were then delivered under sagittal guidance. A total of 22 needles were used to deposit 90 seeds in the prostate gland. The individual seed activity was 0.411 mCi.  SpaceOAR:  Yes  COMPLEX SIMULATION: At the end of the procedure, an anterior radiograph of the pelvis was obtained to document seed positioning and count. Cystoscopy was performed to check the urethra and bladder.  MICRODOSIMETRY: At the end of the procedure, the patient was emitting 0.179 mR/hr at 1 meter. Accordingly, he was considered safe for hospital discharge.  PLAN: The patient will return to the radiation oncology clinic for post implant CT dosimetry in three weeks.   ________________________________  Sheral Apley Tammi Klippel, M.D.

## 2019-07-31 ENCOUNTER — Encounter (HOSPITAL_BASED_OUTPATIENT_CLINIC_OR_DEPARTMENT_OTHER): Payer: Self-pay | Admitting: Urology

## 2019-08-15 NOTE — Progress Notes (Signed)
Radiation Oncology         [phone] ________________________________  Name: David [name] MRN: [id number]  Date: 08/17/2019  DOB: 11-17-1945  Post-Seed Follow-Up Visit Note  CC: Mosie Lukes, MD  Cleon Gustin, MD  Diagnosis:   74 y.o. gentleman with Stage T1c adenocarcinoma of the prostate with Gleason score of 4+3, and PSA of 5.53.    ICD-10-CM   1. Malignant neoplasm of prostate ([place])  C61     Interval Since Last Radiation:  2.5 weeks 07/28/19:  Insertion of radioactive I-125 seeds into the prostate gland; 145 Gy, definitive therapy with placement of SpaceOAR VUE gel.  Narrative:  The patient returns today for routine follow-up.  He is complaining of increased urinary frequency and urinary hesitation symptoms. He filled out a questionnaire regarding urinary function today providing and overall IPSS score of 25 characterizing his symptoms as severe with occasional burning at the end of his stream, hesitancy, weak stream, straining to void, intermittency and increased frequency. He was provided a prescription for Flomax at the time of his recent follow-up visit at [facility] urology and is taking this as prescribed.He has noted some orthostatic hypotension associated with the Flomax but reports that this is manageable. His pre-implant score was 8. He denies any abdominal pain but has had some intermittent constipation. Otherwise, he is overall pleased with his progress to date.  ALLERGIES:  is allergic to cefaclor, cephalosporins, and penicillins.  Meds: Current Outpatient Medications  Medication Sig Dispense Refill  . ascorbic acid (VITAMIN C) 1000 MG tablet Take by mouth.    Marland Kitchen atenolol (TENORMIN) 25 MG tablet Take 1 tablet (25 mg total) by mouth in the morning, at noon, and at bedtime. 270 tablet 1  . atorvastatin (LIPITOR) 10 MG tablet Take 1 tablet (10 mg total) by mouth in the morning. 90 tablet 1  . Colchicine 0.6 MG CAPS 1 tab po bid 60 capsule 0  . cyclobenzaprine  (FLEXERIL) 5 MG tablet Take 1 tablet (5 mg total) by mouth 3 (three) times daily as needed for muscle spasms. 30 tablet 2  . fluconazole (DIFLUCAN) 150 MG tablet Take 1 tablet (150 mg total) by mouth once a week. 2 tablet 1  . FLUoxetine (PROZAC) 10 MG tablet Take 1 tablet (10 mg total) by mouth 2 (two) times daily. 180 tablet 0  . ketoconazole (NIZORAL) 2 % cream Apply 1 application topically daily. 60 g 1  . ketoconazole (NIZORAL) 2 % shampoo Apply 1 application topically 2 (two) times a week. (Patient not taking: Reported on 07/13/2019) 120 mL 12  . LORazepam (ATIVAN) 1 MG tablet Take 1 mg by mouth 2 (two) times daily.    . Menthol, Topical Analgesic, (BIOFREEZE) 4 % GEL Apply topically 3 times/day as needed-between meals & bedtime.    Marland Kitchen omeprazole (PRILOSEC) 20 MG capsule Take 20 mg by mouth daily as needed (acid reflux).     Marland Kitchen OVER THE COUNTER MEDICATION Vit d and vit e daily stopped for procedure    . oxyCODONE-acetaminophen (PERCOCET) 5-325 MG tablet Take 1 tablet by mouth every 4 (four) hours as needed for moderate pain or severe pain. 15 tablet 0  . traMADol (ULTRAM) 50 MG tablet Take 1 tablet (50 mg total) by mouth every 6 (six) hours as needed for moderate pain. 15 tablet 0  . triamcinolone cream (KENALOG) 0.1 % Apply 1 application topically 2 (two) times daily as needed. 80 g 1   No current facility-administered medications for this  encounter.    Physical Findings: In general this is a well appearing Caucasian male in no acute distress. He's alert and oriented x4 and appropriate throughout the examination. Cardiopulmonary assessment is negative for acute distress and he exhibits normal effort.   Lab Findings: Lab Results  Component Value Date   WBC 6.2 07/25/2019   HGB 13.9 07/25/2019   HCT 42.3 07/25/2019   MCV 92.6 07/25/2019   PLT 194 07/25/2019    Radiographic Findings:  Patient underwent CT imaging in our clinic for post implant dosimetry. The CT will be reviewed by Dr.  Tammi Klippel to confirm there is an adequate distribution of radioactive seeds throughout the prostate gland and ensure that there are no seeds in or near the rectum. We suspect the final radiation plan and dosimetry will show appropriate coverage of the prostate gland. He understands that we will call and inform him of any unexpected findings on further review of his imaging and dosimetry.  Impression/Plan: 74 y.o. gentleman with Stage T1c adenocarcinoma of the prostate with Gleason score of 4+3, and PSA of 5.53. The patient is recovering from the effects of radiation. His urinary symptoms should gradually improve over the next 4-6 months. We talked about this today. He is encouraged by his improvement already and is otherwise pleased with his outcome. We also talked about long-term follow-up for prostate cancer following seed implant. He understands that ongoing PSA determinations and digital rectal exams will help perform surveillance to rule out disease recurrence. He had a follow up appointment with Azucena Fallen, NP on 08/07/2019 and will follow up with Dr. Alyson Ingles in October 2021. He understands what to expect with his PSA measures. Patient was also educated today about some of the long-term effects from radiation including a small risk for rectal bleeding and possibly erectile dysfunction. We talked about some of the general management approaches to these potential complications. He will continue taking Flomax as prescribed to help manage his LUTS. However, I did encourage the patient to contact our office or return at any point if he has questions or concerns related to his previous radiation and prostate cancer.  Today, a comprehensive survivorship care plan and treatment summary was reviewed with the patient today detailing his prostate cancer diagnosis, treatment course, potential late/long-term effects of treatment, appropriate follow-up care with recommendations for the future, and patient education  resources.  A copy of this summary, along with a letter will be sent to the patient's primary care provider via fax after today's visit.  2. Cancer screening:  Due to Mr. Willert history and his age, he should receive screening for skin cancers, colon cancer, and lung cancer.  The information and recommendations are listed on the patient's comprehensive care plan/treatment summary and were reviewed in detail with the patient.     3. Health maintenance and wellness promotion: Mr. Mccollum was encouraged to consume 5-7 servings of fruits and vegetables per day. He was provided a copy of the "Nutrition Rainbow" handout, as well as the handout "Take Control of Your Health and [place]" from the [place].  He was also encouraged to engage in moderate to vigorous exercise for 30 minutes per day most days of the week. Information was provided regarding the [facility] fitness program, which is designed for cancer survivors to help them become more physically fit after cancer treatments. We discussed that a healthy BMI is 18.5-24.9 and that maintaining a healthy weight reduces risk of cancer recurrences.  He was  instructed to limit his alcohol consumption and continue to abstain from tobacco use.  Lastly, he was encouraged to use sunscreen and wear protective clothing when in the sun.     4. Support services/counseling: It is not uncommon for this period of the patient's cancer care trajectory to be one of many emotions and stressors.  Mr. Haugan was encouraged to take advantage of our many support services programs, support groups, and/or counseling in coping with his new life as a cancer survivor after completing anti-cancer treatment.  He was offered support today through active listening and expressive supportive counseling.  He was given information regarding our available services and encouraged to contact me with any questions or for help enrolling in any of our support  group/programs.    5. Genetic Counseling: Family history of breast and colon cancer.  The patient was counseled regarding the possibility of a familial predisposition for cancers given his family history of breast and colon cancer.  After further discussion regarding the potential benefits to himself, his siblings, his children and future generations, he is interested in proceeding with genetic counseling/testing. A referral will be made to one of our genetic counselors here in the cancer center for further assessment.     Nicholos Johns, PA-C

## 2019-08-16 ENCOUNTER — Other Ambulatory Visit: Payer: Self-pay | Admitting: Interventional Radiology

## 2019-08-16 ENCOUNTER — Telehealth: Payer: Self-pay | Admitting: *Deleted

## 2019-08-16 DIAGNOSIS — N135 Crossing vessel and stricture of ureter without hydronephrosis: Secondary | ICD-10-CM

## 2019-08-16 NOTE — Telephone Encounter (Signed)
CALLED PATIENT TO REMIND OF POST SEED APPTS. FOR 08-17-19, SPOKE WITH PATIENT AND HE IS AWARE OF THESE APPTS.

## 2019-08-17 ENCOUNTER — Ambulatory Visit
Admission: RE | Admit: 2019-08-17 | Discharge: 2019-08-17 | Disposition: A | Discharge status: Home / Self Care | Payer: Medicare Other | Source: Ambulatory Visit | Attending: Radiation Oncology | Admitting: Radiation Oncology

## 2019-08-17 ENCOUNTER — Ambulatory Visit
Admission: RE | Admit: 2019-08-17 | Discharge: 2019-08-17 | Disposition: A | Discharge status: Home / Self Care | Payer: Medicare Other | Source: Ambulatory Visit | Attending: Urology | Admitting: Urology

## 2019-08-17 ENCOUNTER — Other Ambulatory Visit: Payer: Self-pay

## 2019-08-17 ENCOUNTER — Encounter: Payer: Self-pay | Admitting: Urology

## 2019-08-17 ENCOUNTER — Encounter: Payer: Self-pay | Admitting: Medical Oncology

## 2019-08-17 VITALS — BP 130/75 | HR 62 | Temp 98.7°F | Resp 18 | Ht 72.0 in | Wt 240.4 lb

## 2019-08-17 DIAGNOSIS — C61 Malignant neoplasm of prostate: Secondary | ICD-10-CM

## 2019-08-17 DIAGNOSIS — Z923 Personal history of irradiation: Secondary | ICD-10-CM | POA: Diagnosis not present

## 2019-08-17 DIAGNOSIS — Z79899 Other long term (current) drug therapy: Secondary | ICD-10-CM | POA: Insufficient documentation

## 2019-08-17 NOTE — Progress Notes (Signed)
Weight and vitals stable. Denies pain. Pre seed IPSS 8. Post seed IPSS 25. Reports mild dysuria that is improving daily. Reports hematuria has resolved. Denis urinary leakage. Reports he continues to have to milk his penis to empty his bladder completely following urination. Denies any bowel complaints. Scheduled to follow up with urologist on September 6th.   BP 130/75   Pulse 62   Temp 98.7 F (37.1 C)   Resp 18   Ht 6' (1.829 m)   Wt 240 lb 6.4 oz (109 kg)   SpO2 97%   BMI 32.60 kg/m  Wt Readings from Last 3 Encounters:  08/17/19 240 lb 6.4 oz (109 kg)  07/28/19 242 lb 3.2 oz (109.9 kg)  07/25/19 242 lb (109.8 kg)

## 2019-08-18 ENCOUNTER — Telehealth: Payer: Self-pay | Admitting: Genetic Counselor

## 2019-08-18 NOTE — Telephone Encounter (Signed)
Received a genetic counseling referral from radiation for prostate cancer. David [name] has been cld and scheduled to see David [name] on 8/16 at 9am. Pt aware to arrive 15 minutes early.

## 2019-08-20 ENCOUNTER — Emergency Department (HOSPITAL_BASED_OUTPATIENT_CLINIC_OR_DEPARTMENT_OTHER)
Admission: EM | Admit: 2019-08-20 | Discharge: 2019-08-20 | Disposition: A | Discharge status: Home / Self Care | Payer: Medicare Other | Attending: Emergency Medicine | Admitting: Emergency Medicine

## 2019-08-20 ENCOUNTER — Encounter (HOSPITAL_BASED_OUTPATIENT_CLINIC_OR_DEPARTMENT_OTHER): Payer: Self-pay | Admitting: Emergency Medicine

## 2019-08-20 DIAGNOSIS — Z905 Acquired absence of kidney: Secondary | ICD-10-CM | POA: Insufficient documentation

## 2019-08-20 DIAGNOSIS — Z8546 Personal history of malignant neoplasm of prostate: Secondary | ICD-10-CM | POA: Diagnosis not present

## 2019-08-20 DIAGNOSIS — I480 Paroxysmal atrial fibrillation: Secondary | ICD-10-CM | POA: Diagnosis not present

## 2019-08-20 DIAGNOSIS — Z85828 Personal history of other malignant neoplasm of skin: Secondary | ICD-10-CM | POA: Insufficient documentation

## 2019-08-20 DIAGNOSIS — Z87891 Personal history of nicotine dependence: Secondary | ICD-10-CM | POA: Insufficient documentation

## 2019-08-20 DIAGNOSIS — I10 Essential (primary) hypertension: Secondary | ICD-10-CM | POA: Diagnosis not present

## 2019-08-20 DIAGNOSIS — R002 Palpitations: Secondary | ICD-10-CM | POA: Diagnosis present

## 2019-08-20 DIAGNOSIS — Z79899 Other long term (current) drug therapy: Secondary | ICD-10-CM | POA: Insufficient documentation

## 2019-08-20 LAB — CBC
HCT: 43.3 % (ref 39.0–52.0)
Hemoglobin: 14.6 g/dL (ref 13.0–17.0)
MCH: 30.4 pg (ref 26.0–34.0)
MCHC: 33.7 g/dL (ref 30.0–36.0)
MCV: 90 fL (ref 80.0–100.0)
Platelets: 233 10*3/uL (ref 150–400)
RBC: 4.81 MIL/uL (ref 4.22–5.81)
RDW: 13.4 % (ref 11.5–15.5)
WBC: 6.5 10*3/uL (ref 4.0–10.5)
nRBC: 0 % (ref 0.0–0.2)

## 2019-08-20 LAB — BASIC METABOLIC PANEL
Anion gap: 10 (ref 5–15)
BUN: 19 mg/dL (ref 8–23)
CO2: 22 mmol/L (ref 22–32)
Calcium: 9.9 mg/dL (ref 8.9–10.3)
Chloride: 105 mmol/L (ref 98–111)
Creatinine, Ser: 1.98 mg/dL — ABNORMAL HIGH (ref 0.61–1.24)
GFR calc Af Amer: 37 mL/min — ABNORMAL LOW (ref >60)
GFR calc non Af Amer: 32 mL/min — ABNORMAL LOW (ref >60)
Glucose, Bld: 113 mg/dL — ABNORMAL HIGH (ref 70–99)
Potassium: 4.2 mmol/L (ref 3.5–5.1)
Sodium: 137 mmol/L (ref 135–145)

## 2019-08-20 LAB — MAGNESIUM: Magnesium: 1.8 mg/dL (ref 1.7–2.4)

## 2019-08-20 LAB — TSH: TSH: 3.059 u[IU]/mL (ref 0.350–4.500)

## 2019-08-20 MED ORDER — APIXABAN 2.5 MG PO TABS
5.0000 mg | ORAL_TABLET | Freq: Once | ORAL | Status: AC
  Administered 2019-08-20: 5 mg via ORAL

## 2019-08-20 MED ORDER — APIXABAN 5 MG PO TABS
5.0000 mg | ORAL_TABLET | Freq: Two times a day (BID) | ORAL | 0 refills | Status: DC
Start: 2019-08-20 — End: 2019-08-28

## 2019-08-20 MED ORDER — APIXABAN 2.5 MG PO TABS
ORAL_TABLET | ORAL | Status: AC
  Filled 2019-08-20: qty 1

## 2019-08-20 MED ORDER — DILTIAZEM HCL ER COATED BEADS 180 MG PO CP24
180.0000 mg | ORAL_CAPSULE | Freq: Once | ORAL | Status: DC
  Filled 2019-08-20: qty 1

## 2019-08-20 MED ORDER — DILTIAZEM HCL ER COATED BEADS 180 MG PO CP24
180.0000 mg | ORAL_CAPSULE | Freq: Every day | ORAL | 0 refills | Status: DC
Start: 2019-08-20 — End: 2020-02-12

## 2019-08-20 MED ORDER — DILTIAZEM HCL ER COATED BEADS 120 MG PO CP24
ORAL_CAPSULE | ORAL | Status: AC
  Administered 2019-08-20: 120 mg
  Filled 2019-08-20: qty 1

## 2019-08-20 NOTE — ED Notes (Signed)
AVS and Rx reviewed with client, stressed importance of getting Rx filled and take as ordered, also to make cardiology appt for this week with [facility]. Opportunity for questions provided

## 2019-08-20 NOTE — ED Notes (Signed)
States 2 days ago, began feeling his heart flutter, placed on cont cardiac monitoring, Afib noted, placed on cont POX monitoring as well with int NBP assessments, NPO since 1100hrs, informed not to eat or drink until further orders rec.

## 2019-08-20 NOTE — Discharge Instructions (Addendum)
You were seen in the emergency department for evaluation of an irregular heartbeat.  Your EKG showed that you are in atrial fibrillation again.  We consulted cardiology and they recommended starting you on some medication to help slow your heart rate and also put you back on a blood thinner.  Please contact Dr. Rosezella [place] office for close follow-up this week.  Return to the emergency department for any worsening or concerning symptoms.

## 2019-08-20 NOTE — ED Provider Notes (Signed)
[place] EMERGENCY DEPARTMENT Provider Note   CSN: [id number] Arrival date & time: 08/20/19  1239     History Chief Complaint  Patient presents with  . Irregular Heart Beat    David [name] is a 74 y.o. male.  He has a history of paroxysmal A. fib.  He follows with Dr. Percival Spanish in the A. fib clinic.  He is not on anticoagulation due to? hematuria.  He said 2 days ago he began feeling his heart fluttering.  He has been checking his heart rate and blood pressure and found them to be elevated.  He is fairly confident that his symptoms started around 1:30 PM 2 days ago.  They have been intermittent.  It does not cause him any real symptoms.  He said he had some lightheadedness 2 weeks ago but he attributes that to a new medication.  No chest pain or shortness of breath.  No syncope.  Has been taking his medications regular.  The history is provided by the patient.  Palpitations Palpitations quality:  Irregular Onset quality:  Sudden Duration:  2 days Timing:  Intermittent Progression:  Unchanged Chronicity:  Recurrent Context: not illicit drugs and not stimulant use   Relieved by:  Nothing Worsened by:  Nothing Ineffective treatments:  Deep relaxation Associated symptoms: no chest pain, no cough, no diaphoresis, no lower extremity edema, no nausea, no numbness, no shortness of breath, no vomiting and no weakness   Risk factors: hx of atrial fibrillation        Past Medical History:  Diagnosis Date  . Adenoma of right adrenal gland 07/11/2002   2.4cm , noted on CT ABD  . Allergy   . Anxiety   . Arthritis of both knees 03/26/2016  . Astigmatism   . Back pain   . BCC (basal cell carcinoma of skin)    under right eye and right ear  . Blood transfusion without reported diagnosis   . Cataract 09/29/2016  . Depression   . Diverticulitis 2009  . Diverticulosis   . Fatty liver   . GERD (gastroesophageal reflux disease)   . Gout   . Hearing loss of both ears  03/26/2016  . History of atrial fibrillation    x 2 none since 2020  . History of chronic prostatitis    started at age 65  . History of kidney stones   . Hyperglycemia   . Hyperlipidemia   . Hypertension   . Incomplete right bundle branch block (RBBB)   . Internal hemorrhoids   . Kidney lesion 06/07/2015   Right midportion, 1.1x1.1 cm hyperechoic, noted on [place] ABD  . LAFB (left anterior fascicular block)   . Left anterior fascicular block 08/06/2017   Noted on EKG  . Lipoma of axilla 09/2016  . Liver lesion, right lobe 06/07/2015   2.5x2.4x2.4 cm hypoechoic lesion posterior aspect, noted on [place] ABD  . Low back pain 03/26/2016  . LVH (left ventricular hypertrophy) 08/06/2017   Moderate, Noted on ECHO  . Medicare annual wellness visit, subsequent 03/12/2014  . Muscle cramps   . Nocturia   . OA (osteoarthritis)    Back, Hands, Neck  . Obesity 11/25/2007   Qualifier: Diagnosis of  By: Lenna Gilford MD, Deborra Medina   . Other malaise and fatigue 03/13/2013  . Penis symptom or sign    PENIS sensitive under urethra and itches  . Premature ventricular complex   . Prostate cancer ([place]) dx 2021  . Renal insufficiency  Kidney removed right  . Scoliosis    Upper thoracic and lumbar  . Sinus bradycardia 08/06/2017   Noted on EKG  . Vitamin D deficiency    resolved    Patient Active Problem List   Diagnosis Date Noted  . Malignant neoplasm of prostate ([place]) 04/28/2019  . Pruritus 02/03/2019  . LVH (left ventricular hypertrophy) 10/30/2018  . Tinea versicolor 09/28/2018  . Thoracic back pain 09/26/2018  . Hypercalcemia 08/08/2018  . Nonfunctioning kidney 08/04/2018  . Arm pain 07/18/2018  . Right hip pain 06/22/2018  . Ureteral obstruction 06/07/2018  . Hydronephrosis with ureteropelvic junction (UPJ) obstruction 04/23/2018  . Recurrent nephrolithiasis 04/23/2018  . Hydronephrosis 03/01/2018  . Floppy eyelid syndrome of both eyes 01/24/2018  . Foot pain, bilateral 12/28/2017  . Kidney  stone 09/18/2017  . Insomnia 09/17/2017  . Abnormal TSH 04/15/2017  . Senile nuclear sclerosis 01/18/2017  . Cataract 09/29/2016  . Muscle cramp 09/29/2016  . Nocturia 09/29/2016  . Hyperglycemia 03/29/2016  . Low back pain 03/26/2016  . Cataracts, bilateral 03/26/2016  . Arthritis of both knees 03/26/2016  . Hearing loss 03/26/2016  . Complex renal cyst 06/16/2015  . History of atrial fibrillation 06/16/2015  . Snoring 06/07/2015  . Somnolence 06/07/2015  . Preventative health care 03/07/2015  . Lipoma of axilla 06/04/2014  . Tinea cruris 03/20/2014  . Lichen simplex chronicus 03/20/2014  . Medicare annual wellness visit, subsequent 03/12/2014  . Depression with anxiety 09/25/2013  . Constipation 09/25/2013  . Gouty arthritis of toe of left foot 08/01/2013  . Skin lesion 05/27/2013  . Rash 05/26/2013  . Abnormal liver function 03/18/2013  . IBS (irritable bowel syndrome) 03/18/2013  . Other malaise and fatigue 03/13/2013  . Tinea corporis 03/13/2013  . Gout 12/14/2012  . Diarrhea 12/14/2012  . Renal insufficiency 12/14/2012  . Rectal irritation 09/18/2012  . Diverticulosis   . BCC (basal cell carcinoma of skin)   . Scoliosis 01/19/2011  . Hyperlipidemia, mixed 03/11/2010  . Essential hypertension, benign 03/11/2010  . ALLERGIC RHINITIS 03/11/2010  . Obesity 11/25/2007  . BENIGN NEOPLASM OF ADRENAL GLAND 09/22/2007  . Fatty liver disease, nonalcoholic 17/61/6073  . Osteoarthritis 09/22/2007  . SPONDYLOSIS, LUMBAR 09/22/2007  . GERD 04/12/2007    Past Surgical History:  Procedure Laterality Date  . CARDIOVERSION  07/31/2017  . CATARACT EXTRACTION, BILATERAL    . COLON SURGERY  2009   segmental sigmoid resection  . COLONOSCOPY    . CYSTOSCOPY N/A 07/28/2019   Procedure: CYSTOSCOPY FLEXIBLE;  Surgeon: Cleon Gustin, MD;  Location: [facility];  Service: Urology;  Laterality: N/A;  . CYSTOSCOPY W/ RETROGRADES Right 06/07/2018   Procedure:  CYSTOSCOPY WITH RETROGRADE PYELOGRAM, uphrostogram;  Surgeon: Cleon Gustin, MD;  Location: [facility];  Service: Urology;  Laterality: Right;  . CYSTOSCOPY WITH URETEROSCOPY AND STENT PLACEMENT Right 02/28/2018   Procedure: CYSTOSCOPY WITH URETEROSCOPY [place];  Surgeon: Kathie Rhodes, MD;  Location: [facility];  Service: Urology;  Laterality: Right;  . CYSTOSCOPY/URETEROSCOPY/HOLMIUM LASER/STENT PLACEMENT Right 11/29/2017   Procedure: CYSTOSCOPY/RETROGRADE/URETEROSCOPY/HOLMIUM LASER/STENT PLACEMENT;  Surgeon: Kathie Rhodes, MD;  Location: [facility];  Service: Urology;  Laterality: Right;  . EYE SURGERY Bilateral 01/12/2017   cataract removal  . history of blood tranfusion  age 78  . HOLMIUM LASER APPLICATION Right 07/22/6267   Procedure: HOLMIUM LASER APPLICATION;  Surgeon: Kathie Rhodes, MD;  Location: [facility];  Service: Urology;  Laterality: Right;  . IR BALLOON DILATION URETERAL STRICTURE RIGHT  03/14/2018  . IR NEPHRO TUBE REMOV/FL  03/17/2018  . IR NEPHROSTOGRAM RIGHT THRU EXISTING ACCESS  03/17/2018  . IR NEPHROSTOMY EXCHANGE RIGHT  03/14/2018  . IR NEPHROSTOMY EXCHANGE RIGHT  06/21/2018  . IR NEPHROSTOMY PLACEMENT RIGHT  03/02/2018  . IR NEPHROSTOMY PLACEMENT RIGHT  04/20/2018  . IR URETERAL STENT PLACEMENT EXISTING ACCESS RIGHT  03/14/2018  . NOSE SURGERY     Submucous resection age 70  . NUCLEAR STRESS TEST  06/03/2009  . RADIOACTIVE SEED IMPLANT N/A 07/28/2019   Procedure: RADIOACTIVE SEED IMPLANT/BRACHYTHERAPY IMPLANT;  Surgeon: Cleon Gustin, MD;  Location: [facility];  Service: Urology;  Laterality: N/A;  90 MINS  . ROBOT ASSISTED LAPAROSCOPIC NEPHRECTOMY Right 08/04/2018   Procedure: XI ROBOTIC ASSISTED LAPAROSCOPIC NEPHRECTOMY;  Surgeon: Cleon Gustin, MD;  Location: [facility];  Service: Urology;  Laterality: Right;  3 HRS  . ROBOT ASSISTED PYELOPLASTY Right 06/07/2018   Procedure: attempted XI ROBOTIC ASSISTED PYELOPLASTY, lysis  of adhesions;  Surgeon: Cleon Gustin, MD;  Location: [facility];  Service: Urology;  Laterality: Right;  3 HRS  . SKIN BIOPSY    . SPACE OAR INSTILLATION N/A 07/28/2019   Procedure: SPACE OAR INSTILLATION;  Surgeon: Cleon Gustin, MD;  Location: [facility];  Service: Urology;  Laterality: N/A;  . URETEROSCOPY WITH HOLMIUM LASER LITHOTRIPSY Right 09/24/2017   Procedure: CYSTOSCOPY, URETEROSCOPY WITH HOLMIUM LASER LITHOTRIPSY, STENT PLACEMENT;  Surgeon: Kathie Rhodes, MD;  Location: [facility];  Service: Urology;  Laterality: Right;       Family History  Problem Relation Age of Onset  . Heart disease Mother   . Hypertension Mother   . Stroke Mother   . Colon cancer Mother   . Breast cancer Mother   . Heart disease Father        pacemaker  . Aortic aneurysm Father   . Hypertension Father   . Heart disease Sister   . Atrial fibrillation Sister   . Obesity Sister   . Sleep apnea Sister   . Heart attack Brother   . Other Brother        muscle disease  . Arthritis Brother   . Stroke Brother   . Atrial fibrillation Brother   . Cancer Maternal Grandmother        ?  Marland Kitchen Heart attack Maternal Grandmother   . Diabetes Maternal Grandmother   . Cancer Maternal Grandfather        hodgin's lymphoma  . Heart attack Paternal Grandmother   . Anxiety disorder Paternal Grandmother   . Pneumonia Paternal Grandfather   . Heart attack Brother   . Atrial fibrillation Brother   . Atrial fibrillation Brother   . Heart attack Brother   . Hypertension Brother   . Hyperlipidemia Brother   . Heart attack Brother   . Other Brother        heart valve operation  . Atrial fibrillation Brother   . Esophageal cancer Neg Hx   . Rectal cancer Neg Hx   . Stomach cancer Neg Hx     Social History   Tobacco Use  . Smoking status: Former Smoker    Packs/day: 1.00    Years: 6.00    Pack years: 6.00    Types: Cigarettes    Start date: 01/12/1969    Quit date:  01/12/1969    Years since quitting: 50.6  . Smokeless tobacco: Never Used  . Tobacco comment: [id number]  Vaping Use  .  Vaping Use: Never used  Substance Use Topics  . Alcohol use: Yes    Comment: occ  . Drug use: Never    Home Medications Prior to Admission medications   Medication Sig Start Date End Date Taking? Authorizing Provider  ascorbic acid (VITAMIN C) 1000 MG tablet Take by mouth.    [provider]  atenolol (TENORMIN) 25 MG tablet Take 1 tablet (25 mg total) by mouth in the morning, at noon, and at bedtime. 07/13/19   Mosie Lukes, MD  atorvastatin (LIPITOR) 10 MG tablet Take 1 tablet (10 mg total) by mouth in the morning. 07/21/19   Mosie Lukes, MD  Colchicine 0.6 MG CAPS 1 tab po bid 06/03/18   Carollee Herter, Alferd Apa, DO  cyclobenzaprine (FLEXERIL) 5 MG tablet Take 1 tablet (5 mg total) by mouth 3 (three) times daily as needed for muscle spasms. 05/12/18   Mosie Lukes, MD  fluconazole (DIFLUCAN) 150 MG tablet Take 1 tablet (150 mg total) by mouth once a week. 07/13/19   Mosie Lukes, MD  FLUoxetine (PROZAC) 10 MG tablet Take 1 tablet (10 mg total) by mouth 2 (two) times daily. 06/28/19   Mosie Lukes, MD  Fluoxetine HCl, PMDD, 10 MG TABS  04/19/18   [provider]  ketoconazole (NIZORAL) 2 % cream Apply 1 application topically daily. 06/22/18   Mosie Lukes, MD  ketoconazole (NIZORAL) 2 % shampoo Apply 1 application topically 2 (two) times a week. 09/29/18   Mosie Lukes, MD  LORazepam (ATIVAN) 1 MG tablet Take 1 mg by mouth 2 (two) times daily.    [provider]  Menthol, Topical Analgesic, (BIOFREEZE) 4 % GEL Apply topically 3 times/day as needed-between meals & bedtime.    [provider]  [facility] powder  05/29/19   [provider]  omeprazole (PRILOSEC) 20 MG capsule Take 20 mg by mouth daily as needed (acid reflux).     [provider]  OVER THE COUNTER MEDICATION Vit d and vit e daily stopped for procedure     [provider]  tamsulosin (FLOMAX) 0.4 MG CAPS capsule Take 0.4 mg by mouth daily. 08/07/19   [provider]  traMADol (ULTRAM) 50 MG tablet Take 1 tablet (50 mg total) by mouth every 6 (six) hours as needed for moderate pain. 07/28/19 07/27/20  Cleon Gustin, MD  triamcinolone cream (KENALOG) 0.1 % Apply 1 application topically 2 (two) times daily as needed. 07/13/19   Mosie Lukes, MD    Allergies    Cefaclor, Cephalosporins, and Penicillins  Review of Systems   Review of Systems  Constitutional: Negative for diaphoresis and fever.  HENT: Negative for sore throat.   Eyes: Negative for visual disturbance.  Respiratory: Negative for cough and shortness of breath.   Cardiovascular: Positive for palpitations. Negative for chest pain.  Gastrointestinal: Negative for abdominal pain, nausea and vomiting.  Genitourinary: Negative for dysuria.  Musculoskeletal: Negative for gait problem.  Skin: Negative for rash.  Neurological: Negative for weakness and numbness.    Physical Exam Updated Vital Signs BP 132/78 (BP Location: Left Arm)   Pulse 65   Temp 97.9 F (36.6 C) (Oral)   Resp 20   Ht 6' (1.829 m)   Wt 109 kg   SpO2 98%   BMI 32.60 kg/m   Physical Exam Vitals and nursing note reviewed.  Constitutional:      Appearance: Normal appearance. He is well-developed.  HENT:  Head: Normocephalic and atraumatic.  Eyes:     Conjunctiva/sclera: Conjunctivae normal.  Cardiovascular:     Rate and Rhythm: Tachycardia present. Rhythm irregular.     Pulses: Normal pulses.     Heart sounds: No murmur heard.   Pulmonary:     Effort: Pulmonary effort is normal. No respiratory distress.     Breath sounds: Normal breath sounds.  Abdominal:     Palpations: Abdomen is soft.     Tenderness: There is no abdominal tenderness.  Musculoskeletal:        General: Normal range of motion.     Cervical back: Neck supple.     Right lower leg: No edema.     Left lower  leg: No edema.  Skin:    General: Skin is warm and dry.     Capillary Refill: Capillary refill takes less than 2 seconds.  Neurological:     General: No focal deficit present.     Mental Status: He is alert.     ED Results / Procedures / Treatments   Labs (all labs ordered are listed, but only abnormal results are displayed) Labs Reviewed  BASIC METABOLIC PANEL - Abnormal; Notable for the following components:      Result Value   Glucose, Bld 113 (*)    Creatinine, Ser 1.98 (*)    GFR calc non Af Amer 32 (*)    GFR calc Af Amer 37 (*)    All other components within normal limits  MAGNESIUM  CBC  TSH    EKG EKG Interpretation  Date/Time:  Sunday August 20 2019 12:56:15 EDT Ventricular Rate:  109 PR Interval:    QRS Duration: 84 QT Interval:  332 QTC Calculation: 447 R Axis:   -44 Text Interpretation: Atrial fibrillation with rapid ventricular response Left axis deviation Possible Anterior infarct , age undetermined Abnormal ECG afib new from prior 5/21 Confirmed by Aletta Edouard [phone]) on 08/20/2019 12:59:29 PM   Radiology No results found.  Procedures Procedures (including critical care time)  Medications Ordered in ED Medications  diltiazem (CARDIZEM CD) 24 hr capsule 180 mg (has no administration in time range)  apixaban (ELIQUIS) 2.5 MG tablet (has no administration in time range)  apixaban (ELIQUIS) 2.5 MG tablet (has no administration in time range)  apixaban (ELIQUIS) tablet 5 mg (5 mg Oral Given 08/20/19 1429)  diltiazem (CARDIZEM CD) 120 MG 24 hr capsule (120 mg  Given 08/20/19 1429)    ED Course  I have reviewed the triage vital signs and the nursing notes.  Pertinent labs & imaging results that were available during my care of the patient were reviewed by me and considered in my medical decision making (see chart for details).  Clinical Course as of Aug 20 1735  Nancy Fetter Aug 20, 2019  1417 Reviewed the case with cardiology on-call Dr. Acie Fredrickson.  He feels  that we are outside of 48 hours and he would be at higher risk for embolic phenomenon if we cardiovert.  He recommends starting the patient on Cardizem CD 180 and continuing his other medications, also starting on Eliquis 5 twice daily and following up in the office.  He will message Dr. Percival Spanish to get him close follow-up.  I reviewed this with the patient and he is comfortable with this plan.   [MB]    Clinical Course User Index [MB] Hayden Rasmussen, MD   MDM Rules/Calculators/A&P  This patient complains of elevated heart rate and elevated blood pressure, palpitations; this involves an extensive number of treatment Options and is a complaint that carries with it a high risk of complications and Morbidity. The differential includes A. fib, arrhythmia, metabolic derangement, anemia  I ordered, reviewed and interpreted labs, which included CBC with normal white count normal hemoglobin, chemistries normal other than a mild bump in his creatinine to 1.98, mild elevation of glucose, TSH normal magnesium normal I ordered medication oral Eliquis and Cardizem Previous records obtained and reviewed in epic including prior cardiology note from Dr. Percival Spanish stating that the patient is not currently on Eliquis I consulted cardiology Dr. Acie Fredrickson and discussed lab and imaging findings  Critical Interventions: None  After the interventions stated above, I reevaluated the patient and found patient to be minimally symptomatic.  Heart rate ranges between 80 and 120.  Reviewed recommendations from cardiology and patient is in agreement with plan.  Return instructions discussed.  As far as bleeding on Eliquis the patient felt that the only times he had any hematuria or for when he was actually having a urologic problem.  He has not had any bleeding that he is noticed and is comfortable with starting back on Eliquis.  CHA2DS2/VAS Stroke Risk Points  Current as of 52 minutes ago     2 >= 2  Points: High Risk  1 - 1.99 Points: Medium Risk  0 Points: Low Risk    Last Change: N/A      Details    This score determines the patient's risk of having a stroke if the  patient has atrial fibrillation.       Points Metrics  0 Has Congestive Heart Failure:  No    Current as of 52 minutes ago  0 Has Vascular Disease:  No    Current as of 52 minutes ago  1 Has Hypertension:  Yes    Current as of 52 minutes ago  1 Age:  50    Current as of 52 minutes ago  0 Has Diabetes:  No    Current as of 52 minutes ago  0 Had Stroke:  No  Had TIA:  No  Had thromboembolism:  No    Current as of 52 minutes ago  0 Male:  No    Current as of 52 minutes ago             Final Clinical Impression(s) / ED Diagnoses Final diagnoses:  Paroxysmal atrial fibrillation ([place])    Rx / DC Orders ED Discharge Orders         Ordered    diltiazem (CARDIZEM CD) 180 MG 24 hr capsule  Daily     Discontinue  Reprint     08/20/19 1436    apixaban (ELIQUIS) 5 MG TABS tablet  2 times daily     Discontinue  Reprint     08/20/19 1436           Hayden Rasmussen, MD 08/20/19 1740

## 2019-08-20 NOTE — ED Triage Notes (Signed)
Pt c/o irregular heart beat onset Friday.

## 2019-08-20 NOTE — ED Notes (Signed)
ED Provider at bedside. 

## 2019-08-21 ENCOUNTER — Telehealth: Payer: Self-pay | Admitting: Cardiology

## 2019-08-21 NOTE — Telephone Encounter (Signed)
Spoke to patient-patient had episode of Afib yesterday, went to ER. Restarted on Eliquis and diltiazem, not DCCV due to >48 hours onset.    He states he is fatigued,  +palpitations "jumping around in his chest", reports some SOB with climbing stairs in his house.   States he is "comfortably resting".  He has not started the diltiazem, the pharmacy was out but he is picking up today.    He took HR on phone-76-77, 95% RA.   He took dose of Eliquis last night and this AM.    Offered afib clinic this week but patient would like to keep appointment as scheduled on 8/16 with Dr. Percival Spanish.   Advised to continue to monitor BP and HR (will keep log to bring to appt) and symptoms.  Advised to not miss any doses of Eliquis.   He will call if any symptoms change or if HR begins to increase for sooner appt.

## 2019-08-21 NOTE — Telephone Encounter (Signed)
New Message   Patient is calling because he was in afib over the weekend and went to the [facility] and they advised him to f/u with Dr. Percival Spanish. They think he may need a cardioversion but they did not want to do it at hospital per patient because of possible blood clots. I did schedule him to see Dr. Percival Spanish 8/16 but he would like to be seen sooner or know what Dr. Percival Spanish may suggest.

## 2019-08-22 ENCOUNTER — Telehealth: Payer: Self-pay

## 2019-08-22 NOTE — Telephone Encounter (Signed)
Patient evaluated/treated in ER. F/U with Cardiology 08/28/19.   [facility] Night - Client TELEPHONE ADVICE RECORD [facility] Patient Name: David [name] Gender: Male DOB: 10/08/1945 Age: 74 Y 55 M 7 D Return Phone Number: [phone] (Primary), [phone] (Secondary) Address: City/State/Zip: [place] [address] Client [facility] Night - Client Client Site [facility] - Night Physician Penni Homans - MD Contact Type Call Who Is Calling Patient / Member / Family / Caregiver Call Type Triage / Clinical Relationship To Patient Self Return Phone Number [phone] (Primary) Chief Complaint Dizziness Reason for Call Symptomatic / Request for [place] had prostate cancer and they put 91 little pins in his prostate. When caller stood he felt light headed and his heart was racing. His oxygen reading is 95, and 125 pulse. He has taken it several times and his pulse is over 100. He is not in pain. He had a muscle cramp on his left side. He is slightly light headed. Tamlosin to help urinary flow. Could that be a side affect Translation No Nurse Assessment Nurse: Doy Mince, RN, Secundino Ginger Date/Time (Eastern Time): 08/18/2019 7:34:26 PM Confirm and document reason for call. If symptomatic, describe symptoms. ---Caller had prostate procedure 3 weeks ago. When caller stood he felt light headed and his heart was racing. 125 pulse was the highest it has gotten to. BP 108/76 HR 72 currently. He has a headache. He had a muscle cramp on his left side. He is slightly light headed, short of breath a little. Tamsulosin used twice since surgery to help urinary flow. Has the patient had close contact with a person known or suspected to have the novel coronavirus illness OR traveled / lives in area with major community spread (including international travel) in the last 14 days from the onset of symptoms? *  If Asymptomatic, screen for exposure and travel within the last 14 days. ---No Does the patient have any new or worsening symptoms? ---Yes Will a triage be completed? ---Yes Related visit to physician within the last 2 weeks? ---Yes Does the PT have any chronic conditions? (i.e. diabetes, asthma, this includes High risk factors for pregnancy, etc.) ---Yes List chronic conditions. ---prostate cancer, one kidney Is this a behavioral health or substance abuse call? ---No PLEASE NOTE: All timestamps contained within this report are represented as [place] Standard Time. CONFIDENTIALTY NOTICE: This fax transmission is intended only for the addressee. It contains information that is legally privileged, confidential or otherwise protected from use or disclosure. If you are not the intended recipient, you are strictly prohibited from reviewing, disclosing, copying using or disseminating any of this information or taking any action in reliance on or regarding this information. If you have received this fax in error, please notify us immediately by telephone so that we can arrange for its return to [place]. Phone: [phone], Toll-Free: [phone], Fax: [phone] Page: 2 of 2 Call Id: [id number] Guidelines Guideline Title Affirmed Question Affirmed Notes Nurse Date/Time Eilene Ghazi Time) Dizziness - Lightheadedness [1] MODERATE dizziness (e.g., interferes with normal activities) AND [2] has NOT been evaluated by physician for this (Exception: dizziness caused by heat exposure, sudden standing, or poor fluid intake) Doy Mince, RN, Secundino Ginger 08/18/2019 7:46:16 PM Disp. Time Eilene Ghazi Time) Disposition Final User 08/18/2019 7:52:14 PM See HCP within 4 Hours (or PCP triage) Yes Doy Mince, RN, Secundino Ginger Disposition Overriden: See PCP within 24 Hours Override Reason: Patient's symptoms need a higher level of care Caller Disagree/Comply Comply Caller Understands  Yes PreDisposition Home Care Care Advice Given  Per Guideline SEE PCP WITHIN 24 HOURS: SEE HCP WITHIN 4 HOURS (OR PCP TRIAGE): DRINK FLUIDS: * Drink several glasses of fruit juice, other clear fluids or water. LIE DOWN AND REST: * Lie down with feet elevated for 1 hour. * This will improve circulation and increase blood flow to the brain. CALL BACK IF: * Passes out (faints) * You become worse. CARE ADVICE given per Dizziness (Adult) guideline. CALL BACK IF: * You become worse. Comments User: Wendee Beavers, RN Date/Time Eilene Ghazi Time): 08/18/2019 7:53:51 PM Upgraded outcome from see PCP within 24 hrs to see within 4 hours due to office is closed and patient needs higher level of care due to new onset of dizziness after starting new medication, high heart rate, dropping BP. Referrals [facility] - ED

## 2019-08-25 ENCOUNTER — Telehealth: Payer: Self-pay | Admitting: Genetic Counselor

## 2019-08-25 NOTE — Telephone Encounter (Signed)
David [name] cld to reschedule his genetic counseling appt to 8/23 at 11am.

## 2019-08-26 DIAGNOSIS — Z7189 Other specified counseling: Secondary | ICD-10-CM | POA: Insufficient documentation

## 2019-08-26 NOTE — Progress Notes (Signed)
So    Cardiology Office Note   Date:  08/28/2019   ID:  David [name], DOB October 29, 1945, MRN [id number]  PCP:  Mosie Lukes, MD  Cardiologist:   Minus Breeding, MD   Chief Complaint  Patient presents with  . Atrial Fibrillation      History of Present Illness: David [name] is a 74 y.o. male who presents for follow up of atrial fib.  He has been seen in the Atrial Fib clinic.  He has had cardioversion.  He was in the ED earlier this month with PAF.  I reviewed these records for this visit.   He said he wanted atrial fibrillation he thought related to Proscar.  He stopped taking this.  However, he had fibrillation that persisted for couple of days.  He went to the ER because it was not stopping and it was quite irregular.  He did not have any presyncope or syncope but he followed a little lightheaded.  He said he was in fibrillation when he left to the emergency room but it went back in the sinus rhythm perhaps a day later.  He has not had any since this event.  He denies any chest discomfort, neck or arm discomfort.  He is not having any shortness of breath, PND or orthopnea.  He has had no weight gain or edema.  Of note he did have prostate cancer with seed implants.  Previously when he was taking Xarelto he had hematuria but has not had any problems with that now.  He is walking 7 to 8 miles per week.  He has no problems with this.   Past Medical History:  Diagnosis Date  . Adenoma of right adrenal gland 07/11/2002   2.4cm , noted on CT ABD  . Allergy   . Anxiety   . Arthritis of both knees 03/26/2016  . Astigmatism   . Back pain   . BCC (basal cell carcinoma of skin)    under right eye and right ear  . Blood transfusion without reported diagnosis   . Cataract 09/29/2016  . Depression   . Diverticulitis 2009  . Diverticulosis   . Fatty liver   . GERD (gastroesophageal reflux disease)   . Gout   . Hearing loss of both ears 03/26/2016  . History of atrial fibrillation     x 2 none since 2020  . History of chronic prostatitis    started at age 22  . History of kidney stones   . Hyperglycemia   . Hyperlipidemia   . Hypertension   . Incomplete right bundle branch block (RBBB)   . Internal hemorrhoids   . Kidney lesion 06/07/2015   Right midportion, 1.1x1.1 cm hyperechoic, noted on [place] ABD  . LAFB (left anterior fascicular block)   . Left anterior fascicular block 08/06/2017   Noted on EKG  . Lipoma of axilla 09/2016  . Liver lesion, right lobe 06/07/2015   2.5x2.4x2.4 cm hypoechoic lesion posterior aspect, noted on [place] ABD  . Low back pain 03/26/2016  . LVH (left ventricular hypertrophy) 08/06/2017   Moderate, Noted on ECHO  . Medicare annual wellness visit, subsequent 03/12/2014  . Muscle cramps   . Nocturia   . OA (osteoarthritis)    Back, Hands, Neck  . Obesity 11/25/2007   Qualifier: Diagnosis of  By: Lenna Gilford MD, Deborra Medina   . Other malaise and fatigue 03/13/2013  . Penis symptom or sign    PENIS sensitive under urethra and itches  .  Premature ventricular complex   . Prostate cancer ([place]) dx 2021  . Renal insufficiency    Kidney removed right  . Scoliosis    Upper thoracic and lumbar  . Sinus bradycardia 08/06/2017   Noted on EKG  . Vitamin D deficiency    resolved    Past Surgical History:  Procedure Laterality Date  . CARDIOVERSION  07/31/2017  . CATARACT EXTRACTION, BILATERAL    . COLON SURGERY  2009   segmental sigmoid resection  . COLONOSCOPY    . CYSTOSCOPY N/A 07/28/2019   Procedure: CYSTOSCOPY FLEXIBLE;  Surgeon: Cleon Gustin, MD;  Location: [facility];  Service: Urology;  Laterality: N/A;  . CYSTOSCOPY W/ RETROGRADES Right 06/07/2018   Procedure: CYSTOSCOPY WITH RETROGRADE PYELOGRAM, uphrostogram;  Surgeon: Cleon Gustin, MD;  Location: [facility];  Service: Urology;  Laterality: Right;  . CYSTOSCOPY WITH URETEROSCOPY AND STENT PLACEMENT Right 02/28/2018   Procedure: CYSTOSCOPY WITH URETEROSCOPY [place];   Surgeon: Kathie Rhodes, MD;  Location: [facility];  Service: Urology;  Laterality: Right;  . CYSTOSCOPY/URETEROSCOPY/HOLMIUM LASER/STENT PLACEMENT Right 11/29/2017   Procedure: CYSTOSCOPY/RETROGRADE/URETEROSCOPY/HOLMIUM LASER/STENT PLACEMENT;  Surgeon: Kathie Rhodes, MD;  Location: [facility];  Service: Urology;  Laterality: Right;  . EYE SURGERY Bilateral 01/12/2017   cataract removal  . history of blood tranfusion  age 78  . HOLMIUM LASER APPLICATION Right 3/88/8757   Procedure: HOLMIUM LASER APPLICATION;  Surgeon: Kathie Rhodes, MD;  Location: [facility];  Service: Urology;  Laterality: Right;  . IR BALLOON DILATION URETERAL STRICTURE RIGHT  03/14/2018  . IR NEPHRO TUBE REMOV/FL  03/17/2018  . IR NEPHROSTOGRAM RIGHT THRU EXISTING ACCESS  03/17/2018  . IR NEPHROSTOMY EXCHANGE RIGHT  03/14/2018  . IR NEPHROSTOMY EXCHANGE RIGHT  06/21/2018  . IR NEPHROSTOMY PLACEMENT RIGHT  03/02/2018  . IR NEPHROSTOMY PLACEMENT RIGHT  04/20/2018  . IR URETERAL STENT PLACEMENT EXISTING ACCESS RIGHT  03/14/2018  . NOSE SURGERY     Submucous resection age 20  . NUCLEAR STRESS TEST  06/03/2009  . RADIOACTIVE SEED IMPLANT N/A 07/28/2019   Procedure: RADIOACTIVE SEED IMPLANT/BRACHYTHERAPY IMPLANT;  Surgeon: Cleon Gustin, MD;  Location: [facility];  Service: Urology;  Laterality: N/A;  90 MINS  . ROBOT ASSISTED LAPAROSCOPIC NEPHRECTOMY Right 08/04/2018   Procedure: XI ROBOTIC ASSISTED LAPAROSCOPIC NEPHRECTOMY;  Surgeon: Cleon Gustin, MD;  Location: [facility];  Service: Urology;  Laterality: Right;  3 HRS  . ROBOT ASSISTED PYELOPLASTY Right 06/07/2018   Procedure: attempted XI ROBOTIC ASSISTED PYELOPLASTY, lysis of adhesions;  Surgeon: Cleon Gustin, MD;  Location: [facility];  Service: Urology;  Laterality: Right;  3 HRS  . SKIN BIOPSY    . SPACE OAR INSTILLATION N/A 07/28/2019   Procedure: SPACE OAR INSTILLATION;  Surgeon: Cleon Gustin, MD;  Location: [facility];  Service: Urology;  Laterality: N/A;  . URETEROSCOPY WITH HOLMIUM LASER LITHOTRIPSY Right 09/24/2017   Procedure: CYSTOSCOPY, URETEROSCOPY WITH HOLMIUM LASER LITHOTRIPSY, STENT PLACEMENT;  Surgeon: Kathie Rhodes, MD;  Location: [facility];  Service: Urology;  Laterality: Right;     Current Outpatient Medications  Medication Sig Dispense Refill  . apixaban (ELIQUIS) 5 MG TABS tablet Take 1 tablet (5 mg total) by mouth 2 (two) times daily. 180 tablet 3  . ascorbic acid (VITAMIN C) 1000 MG tablet Take by mouth.    Marland Kitchen atenolol (TENORMIN) 25 MG tablet Take 1 tablet (25 mg total) by mouth in the morning, at noon,  and at bedtime. 270 tablet 1  . atorvastatin (LIPITOR) 10 MG tablet Take 1 tablet (10 mg total) by mouth in the morning. 90 tablet 1  . Colchicine 0.6 MG CAPS 1 tab po bid 60 capsule 0  . diltiazem (CARDIZEM CD) 180 MG 24 hr capsule Take 1 capsule (180 mg total) by mouth daily. 30 capsule 0  . fluconazole (DIFLUCAN) 150 MG tablet Take 1 tablet (150 mg total) by mouth once a week. 2 tablet 1  . FLUoxetine (PROZAC) 10 MG tablet Take 1 tablet (10 mg total) by mouth 2 (two) times daily. 180 tablet 0  . Fluoxetine HCl, PMDD, 10 MG TABS     . LORazepam (ATIVAN) 1 MG tablet Take 1 mg by mouth 2 (two) times daily.    Marland Kitchen omeprazole (PRILOSEC) 20 MG capsule Take 20 mg by mouth daily as needed (acid reflux).     . tamsulosin (FLOMAX) 0.4 MG CAPS capsule Take 0.4 mg by mouth daily.    . traMADol (ULTRAM) 50 MG tablet Take 1 tablet (50 mg total) by mouth every 6 (six) hours as needed for moderate pain. 15 tablet 0   No current facility-administered medications for this visit.    Allergies:   Cefaclor, Cephalosporins, and Penicillins    ROS:  Please see the history of present illness.   Otherwise, review of systems are positive for none.   All other systems are reviewed and negative.    PHYSICAL EXAM: VS:  BP 134/80 (BP Location: Left Arm, Patient Position: Sitting,  Cuff Size: Normal)   Pulse (!) 52   Ht 6' (1.829 m)   Wt 240 lb 12.8 oz (109.2 kg)   BMI 32.66 kg/m  , BMI Body mass index is 32.66 kg/m. GENERAL:  Well appearing NECK:  No jugular venous distention, waveform within normal limits, carotid upstroke brisk and symmetric, no bruits, no thyromegaly LUNGS:  Clear to auscultation bilaterally CHEST:  Unremarkable HEART:  PMI not displaced or sustained,S1 and S2 within normal limits, no S3, no S4, no clicks, no rubs, no murmurs ABD:  Flat, positive bowel sounds normal in frequency in pitch, no bruits, no rebound, no guarding, no midline pulsatile mass, no hepatomegaly, no splenomegaly EXT:  2 plus pulses throughout, no edema, no cyanosis no clubbing   EKG:  EKG is ordered today. Sinus rhythm, rate 52, left axis deviation, poor anterior R wave progression, no acute ST-T wave changes.  Recent Labs: 07/25/2019: ALT 24 08/20/2019: BUN 19; Creatinine, Ser 1.98; Hemoglobin 14.6; Magnesium 1.8; Platelets 233; Potassium 4.2; Sodium 137; TSH 3.059    Lipid Panel    Component Value Date/Time   CHOL 225 (H) 07/21/2019 0845   TRIG 159.0 (H) 07/21/2019 0845   HDL 38.20 (L) 07/21/2019 0845   CHOLHDL 6 07/21/2019 0845   VLDL 31.8 07/21/2019 0845   LDLCALC 155 (H) 07/21/2019 0845   LDLCALC 179 (H) 07/01/2018 1446   LDLDIRECT 168.5 03/29/2006 1007      Wt Readings from Last 3 Encounters:  08/28/19 240 lb 12.8 oz (109.2 kg)  08/20/19 240 lb 6.4 oz (109 kg)  08/17/19 240 lb 6.4 oz (109 kg)      Other studies Reviewed: Additional studies/ records that were reviewed today include: ED records Review of the above records demonstrates:  Please see elsewhere in the note.     ASSESSMENT AND PLAN:  ATRIAL FIB:    [name] has a CHA2DS2 - VASc score of 2.    Given  the recurrence of this he does need to be back on blood thinners and I agree with this.   At this point he has no contraindication and is not having hematuria as he had previously  on Xarelto.  I agree with continuing the Eliquis and continuing the current dose of Cardizem.  I will check a CBC in 1 month.  He did have blood work to include a normal TSH earlier this month.   LVH:   This was moderate.  I will follow this up likely with an echo next year.   HTN: Blood pressure is well controlled.  No change in therapy.   CKD IIIA: His creatinine is 1.98.  We will continue to follow this and is followed by his primary provider.  This has been stable.  COVID EDUCATION: He has been vaccinated.  Current medicines are reviewed at length with the patient today.  The patient does not have concerns regarding medicines.  The following changes have been made: None  Labs/ tests ordered today include:   Orders Placed This Encounter  Procedures  . CBC  . EKG 12-Lead     Disposition:   FU with me in 6 months.     Signed, Minus Breeding, MD  08/28/2019 11:28 AM    [facility]

## 2019-08-27 NOTE — Progress Notes (Signed)
  Radiation Oncology         [phone] ________________________________  Name: David [name] MRN: [id number]  Date: 08/17/2019  DOB: February 14, 1945  COMPLEX SIMULATION NOTE  NARRATIVE:  The patient was brought to the [facility] suite today following prostate seed implantation approximately one month ago.  Identity was confirmed.  All relevant records and images related to the planned course of therapy were reviewed.  Then, the patient was set-up supine.  CT images were obtained.  The CT images were loaded into the planning software.  Then the prostate and rectum were contoured.  Treatment planning then occurred.  The implanted iodine 125 seeds were identified by the physics staff for projection of radiation distribution  I have requested : 3D Simulation  I have requested a DVH of the following structures: Prostate and rectum.    ________________________________  Sheral Apley Tammi Klippel, M.D.

## 2019-08-28 ENCOUNTER — Inpatient Hospital Stay: Payer: Medicare Other

## 2019-08-28 ENCOUNTER — Inpatient Hospital Stay: Payer: Medicare Other | Admitting: Genetic Counselor

## 2019-08-28 ENCOUNTER — Encounter: Payer: Self-pay | Admitting: Cardiology

## 2019-08-28 ENCOUNTER — Ambulatory Visit: Payer: Medicare Other | Admitting: Cardiology

## 2019-08-28 ENCOUNTER — Other Ambulatory Visit: Payer: Self-pay

## 2019-08-28 VITALS — BP 134/80 | HR 52 | Ht 72.0 in | Wt 240.8 lb

## 2019-08-28 DIAGNOSIS — I10 Essential (primary) hypertension: Secondary | ICD-10-CM | POA: Diagnosis not present

## 2019-08-28 DIAGNOSIS — Z7189 Other specified counseling: Secondary | ICD-10-CM

## 2019-08-28 DIAGNOSIS — I517 Cardiomegaly: Secondary | ICD-10-CM | POA: Diagnosis not present

## 2019-08-28 DIAGNOSIS — I480 Paroxysmal atrial fibrillation: Secondary | ICD-10-CM

## 2019-08-28 MED ORDER — APIXABAN 5 MG PO TABS: 5.0000 mg | ORAL_TABLET | Freq: Two times a day (BID) | ORAL | 3 refills | Status: DC

## 2019-08-28 NOTE — Patient Instructions (Signed)
Medication Instructions:  No changes Eliquis refilled *If you need a refill on your cardiac medications before your next appointment, please call your pharmacy*  Lab Work: Your physician recommends that you return for lab work in: 1 Month (CBC) If you have labs (blood work) drawn today and your tests are completely normal, you will receive your results only by: Marland Kitchen MyChart Message (if you have MyChart) OR . A paper copy in the mail If you have any lab test that is abnormal or we need to change your treatment, we will call you to review the results.  Testing/Procedures: None ordered this visit  Follow-Up: At [facility], you and your health needs are our priority.  As part of our continuing mission to provide you with exceptional heart care, we have created designated Provider Care Teams.  These Care Teams include your primary Cardiologist (physician) and Advanced Practice Providers (APPs -  Physician Assistants and Nurse Practitioners) who all work together to provide you with the care you need, when you need it.  Your next appointment:   6 month(s)  You will receive a reminder letter in the mail two months in advance. If you don't receive a letter, please call our office to schedule the follow-up appointment.  The format for your next appointment:   In Person  Provider:   Minus Breeding, MD

## 2019-09-04 ENCOUNTER — Inpatient Hospital Stay: Payer: Medicare Other

## 2019-09-04 ENCOUNTER — Other Ambulatory Visit: Payer: Self-pay | Admitting: Genetic Counselor

## 2019-09-04 ENCOUNTER — Inpatient Hospital Stay: Payer: Medicare Other | Attending: Radiation Oncology | Admitting: Genetic Counselor

## 2019-09-04 ENCOUNTER — Other Ambulatory Visit: Payer: Self-pay

## 2019-09-04 DIAGNOSIS — C61 Malignant neoplasm of prostate: Secondary | ICD-10-CM

## 2019-09-04 DIAGNOSIS — Z803 Family history of malignant neoplasm of breast: Secondary | ICD-10-CM | POA: Diagnosis not present

## 2019-09-04 DIAGNOSIS — Z800 Family history of malignant neoplasm of digestive organs: Secondary | ICD-10-CM | POA: Diagnosis not present

## 2019-09-04 LAB — GENETIC SCREENING ORDER

## 2019-09-04 NOTE — Progress Notes (Signed)
Genetic testing

## 2019-09-05 ENCOUNTER — Encounter: Payer: Self-pay | Admitting: Radiation Oncology

## 2019-09-05 ENCOUNTER — Encounter: Payer: Self-pay | Admitting: Genetic Counselor

## 2019-09-05 DIAGNOSIS — C61 Malignant neoplasm of prostate: Secondary | ICD-10-CM | POA: Diagnosis not present

## 2019-09-05 DIAGNOSIS — Z800 Family history of malignant neoplasm of digestive organs: Secondary | ICD-10-CM | POA: Insufficient documentation

## 2019-09-05 DIAGNOSIS — Z803 Family history of malignant neoplasm of breast: Secondary | ICD-10-CM | POA: Insufficient documentation

## 2019-09-05 NOTE — Progress Notes (Signed)
REFERRING PROVIDER: Freeman Caldron, PA-C [place],  [place] [address]  PRIMARY PROVIDER:  Mosie Lukes, MD  PRIMARY REASON FOR VISIT:  1. Malignant neoplasm of prostate ([place])   2. Family history of breast cancer   3. Family history of colon cancer      HISTORY OF PRESENT ILLNESS:   Mr. Danielsen, a 74 y.o. male, was seen for a [facility] cancer genetics consultation at the request of Dr. Vincent Gros due to a personal and family history of cancer.  Mr. Busic presents to clinic today to discuss the possibility of a hereditary predisposition to cancer, genetic testing, and to further clarify his future cancer risks, as well as potential cancer risks for family members.   In March 2021, at the age of 2, Mr. Solanki was diagnosed with Stage IIC cancer of the prostate.  Gleason score = 7. The treatment plan includes radiation.    CANCER HISTORY:  Oncology History  Malignant neoplasm of prostate ([place])  03/28/2019 Cancer Staging   Staging form: Prostate, AJCC 8th Edition - Clinical stage from 03/28/2019: Stage IIC (cT1c, cN0, cM0, PSA: 5.8, Grade Group: 3) - Signed by Freeman Caldron, PA-C on 04/28/2019   04/28/2019 Initial Diagnosis   Malignant neoplasm of prostate [facility])      Past Medical History:  Diagnosis Date  . Adenoma of right adrenal gland 07/11/2002   2.4cm , noted on CT ABD  . Allergy   . Anxiety   . Arthritis of both knees 03/26/2016  . Astigmatism   . Back pain   . BCC (basal cell carcinoma of skin)    under right eye and right ear  . Blood transfusion without reported diagnosis   . Cataract 09/29/2016  . Depression   . Diverticulitis 2009  . Diverticulosis   . Family history of breast cancer   . Family history of colon cancer   . Fatty liver   . GERD (gastroesophageal reflux disease)   . Gout   . Hearing loss of both ears 03/26/2016  . History of atrial fibrillation    x 2 none since 2020  . History of chronic prostatitis    started at age 21  . History of  kidney stones   . Hyperglycemia   . Hyperlipidemia   . Hypertension   . Incomplete right bundle branch block (RBBB)   . Internal hemorrhoids   . Kidney lesion 06/07/2015   Right midportion, 1.1x1.1 cm hyperechoic, noted on [place] ABD  . LAFB (left anterior fascicular block)   . Left anterior fascicular block 08/06/2017   Noted on EKG  . Lipoma of axilla 09/2016  . Liver lesion, right lobe 06/07/2015   2.5x2.4x2.4 cm hypoechoic lesion posterior aspect, noted on [place] ABD  . Low back pain 03/26/2016  . LVH (left ventricular hypertrophy) 08/06/2017   Moderate, Noted on ECHO  . Medicare annual wellness visit, subsequent 03/12/2014  . Muscle cramps   . Nocturia   . OA (osteoarthritis)    Back, Hands, Neck  . Obesity 11/25/2007   Qualifier: Diagnosis of  By: Lenna Gilford MD, Deborra Medina   . Other malaise and fatigue 03/13/2013  . Penis symptom or sign    PENIS sensitive under urethra and itches  . Premature ventricular complex   . Prostate cancer ([place]) dx 2021  . Renal insufficiency    Kidney removed right  . Scoliosis    Upper thoracic and lumbar  . Sinus bradycardia 08/06/2017   Noted on EKG  .  Vitamin D deficiency    resolved    Past Surgical History:  Procedure Laterality Date  . CARDIOVERSION  07/31/2017  . CATARACT EXTRACTION, BILATERAL    . COLON SURGERY  2009   segmental sigmoid resection  . COLONOSCOPY    . CYSTOSCOPY N/A 07/28/2019   Procedure: CYSTOSCOPY FLEXIBLE;  Surgeon: Cleon Gustin, MD;  Location[facility];  Service: Urology;  Laterality: N/A;  . CYSTOSCOPY W/ RETROGRADES Right 06/07/2018   Procedure: CYSTOSCOPY WITH RETROGRADE PYELOGRAM, uphrostogram;  Surgeon: Cleon Gustin, MD;  Location: [facility];  Service: Urology;  Laterality: Right;  . CYSTOSCOPY WITH URETEROSCOPY AND STENT PLACEMENT Right 02/28/2018   Procedure: CYSTOSCOPY WITH URETEROSCOPY [place];  Surgeon: Kathie Rhodes, MD;  Location[facility];  Service: Urology;   Laterality: Right;  . CYSTOSCOPY/URETEROSCOPY/HOLMIUM LASER/STENT PLACEMENT Right 11/29/2017   Procedure: CYSTOSCOPY/RETROGRADE/URETEROSCOPY/HOLMIUM LASER/STENT PLACEMENT;  Surgeon: Kathie Rhodes, MD;  Location: [facility];  Service: Urology;  Laterality: Right;  . EYE SURGERY Bilateral 01/12/2017   cataract removal  . history of blood tranfusion  age 49  . HOLMIUM LASER APPLICATION Right 7/62/8315   Procedure: HOLMIUM LASER APPLICATION;  Surgeon: Kathie Rhodes, MD;  [facility];  Service: Urology;  Laterality: Right;  . IR BALLOON DILATION URETERAL STRICTURE RIGHT  03/14/2018  . IR NEPHRO TUBE REMOV/FL  03/17/2018  . IR NEPHROSTOGRAM RIGHT THRU EXISTING ACCESS  03/17/2018  . IR NEPHROSTOMY EXCHANGE RIGHT  03/14/2018  . IR NEPHROSTOMY EXCHANGE RIGHT  06/21/2018  . IR NEPHROSTOMY PLACEMENT RIGHT  03/02/2018  . IR NEPHROSTOMY PLACEMENT RIGHT  04/20/2018  . IR URETERAL STENT PLACEMENT EXISTING ACCESS RIGHT  03/14/2018  . NOSE SURGERY     Submucous resection age 66  . NUCLEAR STRESS TEST  06/03/2009  . RADIOACTIVE SEED IMPLANT N/A 07/28/2019   Procedure: RADIOACTIVE SEED IMPLANT/BRACHYTHERAPY IMPLANT;  Surgeon: Cleon Gustin, MD;  Location: [facility];  Service: Urology;  Laterality: N/A;  90 MINS  . ROBOT ASSISTED LAPAROSCOPIC NEPHRECTOMY Right 08/04/2018   Procedure: XI ROBOTIC ASSISTED LAPAROSCOPIC NEPHRECTOMY;  Surgeon: Cleon Gustin, MD;  Location: [facility];  Service: Urology;  Laterality: Right;  3 HRS  . ROBOT ASSISTED PYELOPLASTY Right 06/07/2018   Procedure: attempted XI ROBOTIC ASSISTED PYELOPLASTY, lysis of adhesions;  Surgeon: Cleon Gustin, MD;  Location: [facility];  Service: Urology;  Laterality: Right;  3 HRS  . SKIN BIOPSY    . SPACE OAR INSTILLATION N/A 07/28/2019   Procedure: SPACE OAR INSTILLATION;  Surgeon: Cleon Gustin, MD;  Location[facility];  Service: Urology;  Laterality: N/A;  . URETEROSCOPY WITH HOLMIUM LASER  LITHOTRIPSY Right 09/24/2017   Procedure: CYSTOSCOPY, URETEROSCOPY WITH HOLMIUM LASER LITHOTRIPSY, STENT PLACEMENT;  Surgeon: Kathie Rhodes, MD;  Location: [facility];  Service: Urology;  Laterality: Right;    Social History   Socioeconomic History  . Marital status: Married    Spouse name: Not on file  . Number of children: 3  . Years of education: Not on file  . Highest education level: Not on file  Occupational History  . 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 Smoking status: Former Smoker    Packs/day: 1.00    Years: 6.00    Pack years: 6.00    Types: Cigarettes    Start date: 01/12/1969    Quit date: 01/12/1969    Years since quitting: 50.6  . Smokeless tobacco: Never Used  .  Tobacco comment: [id number]  Vaping Use  . Vaping Use: Never used  Substance and Sexual Activity  . Alcohol use: Yes    Comment: occ  . Drug use: Never  . Sexual activity: Yes  Other Topics Concern  . Not on file  Social History Narrative   The patient is married for the second time, he has 3 sons.   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   2 alcoholic beverages most days.   1 caffeinated beverage daily   No drug use no current tobacco use he is a prior smoker   12/24/2016   Social Determinants of Health   Financial Resource Strain:   . Difficulty of Paying Living Expenses: Not on file  Food Insecurity:   . Worried About Charity fundraiser in the Last Year: Not on file  . Ran Out of Food in the Last Year: Not on file  Transportation Needs:   . Lack of Transportation (Medical): Not on file  . Lack of Transportation (Non-Medical): Not on file  Physical Activity:   . Days of Exercise per Week: Not on file  . Minutes of Exercise per Session: Not on file  Stress:   . Feeling of Stress : Not on file  Social Connections:   . Frequency of Communication with Friends and Family: Not on file  . Frequency of Social Gatherings with Friends and Family: Not on file  .  Attends Religious Services: Not on file  . Active Member of Clubs or Organizations: Not on file  . Attends Archivist Meetings: Not on file  . Marital Status: Not on file     FAMILY HISTORY:  We obtained a detailed, 4-generation family history.  Significant diagnoses are listed below: Family History  Problem Relation Age of Onset  . Heart disease Mother   . Hypertension Mother   . Stroke Mother   . Colon cancer Mother 36  . Breast cancer Mother 106  . Heart disease Father        pacemaker  . Aortic aneurysm Father   . Hypertension Father   . Lung cancer Father 59  . Heart disease Sister   . Atrial fibrillation Sister   . Obesity Sister   . Sleep apnea Sister   . Heart attack Brother   . Other Brother        muscle disease  . Arthritis Brother   . Stroke Brother   . Atrial fibrillation Brother   . Heart attack Maternal Grandmother   . Diabetes Maternal Grandmother   . Cancer Maternal Grandfather 35       hodgin's lymphoma  . Heart attack Paternal Grandmother   . Anxiety disorder Paternal Grandmother   . Pneumonia Paternal Grandfather   . Heart attack Brother   . Atrial fibrillation Brother   . Atrial fibrillation Brother   . Heart attack Brother   . Hypertension Brother   . Hyperlipidemia Brother   . Heart attack Brother   . Other Brother        heart valve operation  . Atrial fibrillation Brother   . Lymphoma Niece 8  . Liver cancer Nephew 2       great nephew; d. 67  . Esophageal cancer Neg Hx   . Rectal cancer Neg Hx   . Stomach cancer Neg Hx     The patient has three sons who are cancer free.  He has five brothers and one sister who are cancer free. One brother  has a daughter who died of complications from the treatment of her Lymphoma, and his sister's grandson was diagnosed with liver cancer at age 41 and died at 80.  Both parents are deceased.  The patient's mother had breast cancer at 79 and colon cancer at 19.  She had six sisters and five  brothers who were all cancer free.  His grandfather died from Hodgkin's lymphoma at 69 and his grandmother died of heart disease at 61.  The patient's father had lung cancer as a result of smoking.  He was an only child.  His parents died of pneumonia and heart disease.  Mr. Blumstein is unaware of previous family history of genetic testing for hereditary cancer risks. Patient's maternal ancestors are of [place] descent, and paternal ancestors are of [place] descent. There is no reported Ashkenazi Jewish ancestry. There is no known consanguinity.  GENETIC COUNSELING ASSESSMENT: Mr. Koppelman is a 74 y.o. male with a personal and family history of cancer which is somewhat suggestive of a hereditary cancer syndrome and predisposition to cancer given the combination of cancer and the high Gleason score. We, therefore, discussed and recommended the following at today's visit.   DISCUSSION: We discussed that up to 15% of prostate cancer is hereditary, with most cases associated with BRCA mutations.  There are other genes that can be associated with hereditary prostate cancer syndromes.  These include DNA repair genes such as ATM, CHEK2 and PALB2.  We discussed that testing is beneficial for several reasons including knowing how to follow individuals after completing their treatment, identifying whether potential treatment options such as PARP inhibitors would be beneficial, and understand if other family members could be at risk for cancer and allow them to undergo genetic testing.   We reviewed the characteristics, features and inheritance patterns of hereditary cancer syndromes. We also discussed genetic testing, including the appropriate family members to test, the process of testing, insurance coverage and turn-around-time for results. We discussed the implications of a negative, positive, carrier and/or variant of uncertain significant result. We recommended Mr. Mcree pursue genetic testing for the common hereditary  cancer gene panel. The Common Hereditary Gene Panel offered by [facility] includes sequencing and/or deletion duplication testing of the following 48 genes: APC, ATM, AXIN2, BARD1, BMPR1A, BRCA1, BRCA2, BRIP1, CDH1, CDK4, CDKN2A (p14ARF), CDKN2A (p16INK4a), CHEK2, CTNNA1, DICER1, EPCAM (Deletion/duplication testing only), GREM1 (promoter region deletion/duplication testing only), KIT, MEN1, MLH1, MSH2, MSH3, MSH6, MUTYH, NBN, NF1, NHTL1, PALB2, PDGFRA, PMS2, POLD1, POLE, PTEN, RAD50, RAD51C, RAD51D, RNF43, SDHB, SDHC, SDHD, SMAD4, SMARCA4. STK11, TP53, TSC1, TSC2, and VHL.  The following genes were evaluated for sequence changes only: SDHA and HOXB13 c.251G>A variant only.   Based on Mr. Mckamie personal and family history of cancer, he meets medical criteria for genetic testing. He has agreed to go through the DETECT Prostate cancer program through [facility] [facility] [place] which collect non-identifying information including age, diagnosis, staging and referring provider.   PLAN: After considering the risks, benefits, and limitations, Mr. Ramthun provided informed consent to pursue genetic testing and the blood sample was sent to [facility] for analysis of the DETECT Prostate cancer program. Results should be available within approximately 2-3 weeks' time, at which point they will be disclosed by telephone to Mr. Severns, as will any additional recommendations warranted by these results. Mr. Kampe will receive a summary of his genetic counseling visit and a copy of his results once available. This information will also be available in Epic.   Lastly, we encouraged Mr.  Buddy Duty to remain in contact with cancer genetics annually so that we can continuously update the family history and inform him of any changes in cancer genetics and testing that may be of benefit for this family.   Mr. Mceachern questions were answered to his satisfaction today. Our contact information was provided should additional questions or concerns arise. Thank  you for the referral and allowing [place] to share in the care of your patient.   Garv Kuechle P. Florene Glen, [place], [facility] Licensed, Insurance risk surveyor [email or web address] phone: [phone]  The patient was seen for a total of 45 minutes in face-to-face genetic counseling.  This patient was discussed with Drs. Magrinat, Lindi Adie and/or Burr Medico who agrees with the above.    _______________________________________________________________________ For Office Staff:  Number of people involved in session: 1 Was an Intern/ student involved with case: yes; Layne Benton

## 2019-09-26 ENCOUNTER — Telehealth: Payer: Self-pay | Admitting: Genetic Counselor

## 2019-09-26 ENCOUNTER — Encounter: Payer: Self-pay | Admitting: Genetic Counselor

## 2019-09-26 ENCOUNTER — Ambulatory Visit: Payer: Self-pay | Admitting: Genetic Counselor

## 2019-09-26 DIAGNOSIS — C61 Malignant neoplasm of prostate: Secondary | ICD-10-CM

## 2019-09-26 DIAGNOSIS — Z1379 Encounter for other screening for genetic and chromosomal anomalies: Secondary | ICD-10-CM | POA: Insufficient documentation

## 2019-09-26 DIAGNOSIS — Z1211 Encounter for screening for malignant neoplasm of colon: Secondary | ICD-10-CM | POA: Insufficient documentation

## 2019-09-26 NOTE — Progress Notes (Signed)
HPI:  Mr. Kinn was previously seen in the [facility] clinic due to a personal and family history of cancer and concerns regarding a hereditary predisposition to cancer. Please refer to our prior cancer genetics clinic note for more information regarding our discussion, assessment and recommendations, at the time. Mr. Makarewicz recent genetic test results were disclosed to him, as were recommendations warranted by these results. These results and recommendations are discussed in more detail below.  CANCER HISTORY:  Oncology History  Malignant neoplasm of prostate ([place])  03/28/2019 Cancer Staging   Staging form: Prostate, AJCC 8th Edition - Clinical stage from 03/28/2019: Stage IIC (cT1c, cN0, cM0, PSA: 5.8, Grade Group: 3) - Signed by Freeman Caldron, PA-C on 04/28/2019   04/28/2019 Initial Diagnosis   Malignant neoplasm of prostate ([place])   09/23/2019 Genetic Testing   Negative genetic testing on the Multi-cancer panel.  The Multi-Gene Panel offered by [facility] includes sequencing and/or deletion duplication testing of the following 85 genes: AIP, ALK, APC, ATM, AXIN2,BAP1,  BARD1, BLM, BMPR1A, BRCA1, BRCA2, BRIP1, CASR, CDC73, CDH1, CDK4, CDKN1B, CDKN1C, CDKN2A (p14ARF), CDKN2A (p16INK4a), CEBPA, CHEK2, CTNNA1, DICER1, DIS3L2, EGFR (c.2369C>T, p.Thr790Met variant only), EPCAM (Deletion/duplication testing only), FH, FLCN, GATA2, GPC3, GREM1 (Promoter region deletion/duplication testing only), HOXB13 (c.251G>A, p.Gly84Glu), HRAS, KIT, MAX, MEN1, MET, MITF (c.952G>A, p.Glu318Lys variant only), MLH1, MSH2, MSH3, MSH6, MUTYH, NBN, NF1, NF2, NTHL1, PALB2, PDGFRA, PHOX2B, PMS2, POLD1, POLE, POT1, PRKAR1A, PTCH1, PTEN, RAD50, RAD51C, RAD51D, RB1, RECQL4, RET, RNF43, RUNX1, SDHAF2, SDHA (sequence changes only), SDHB, SDHC, SDHD, SMAD4, SMARCA4, SMARCB1, SMARCE1, STK11, SUFU, TERC, TERT, TMEM127, TP53, TSC1, TSC2, VHL, WRN and WT1.  The report date is September 23, 2019.     FAMILY HISTORY:  We  obtained a detailed, 4-generation family history.  Significant diagnoses are listed below: Family History  Problem Relation Age of Onset  . Heart disease Mother   . Hypertension Mother   . Stroke Mother   . Colon cancer Mother 46  . Breast cancer Mother 51  . Heart disease Father        pacemaker  . Aortic aneurysm Father   . Hypertension Father   . Lung cancer Father 40  . Heart disease Sister   . Atrial fibrillation Sister   . Obesity Sister   . Sleep apnea Sister   . Heart attack Brother   . Other Brother        muscle disease  . Arthritis Brother   . Stroke Brother   . Atrial fibrillation Brother   . Heart attack Maternal Grandmother   . Diabetes Maternal Grandmother   . Cancer Maternal Grandfather 87       hodgin's lymphoma  . Heart attack Paternal Grandmother   . Anxiety disorder Paternal Grandmother   . Pneumonia Paternal Grandfather   . Heart attack Brother   . Atrial fibrillation Brother   . Atrial fibrillation Brother   . Heart attack Brother   . Hypertension Brother   . Hyperlipidemia Brother   . Heart attack Brother   . Other Brother        heart valve operation  . Atrial fibrillation Brother   . Lymphoma Niece 53  . Liver cancer Nephew 2       great nephew; d. 20  . Esophageal cancer Neg Hx   . Rectal cancer Neg Hx   . Stomach cancer Neg Hx     The patient has three sons who are cancer free.  He has five brothers and  one sister who are cancer free. One brother has a daughter who died of complications from the treatment of her Lymphoma, and his sister's grandson was diagnosed with liver cancer at age 80 and died at 25.  Both parents are deceased.  The patient's mother had breast cancer at 88 and colon cancer at 74.  She had six sisters and five brothers who were all cancer free.  His grandfather died from Hodgkin's lymphoma at 25 and his grandmother died of heart disease at 61.  The patient's father had lung cancer as a result of smoking.  He was an only  child.  His parents died of pneumonia and heart disease.  Mr. Even is unaware of previous family history of genetic testing for hereditary cancer risks. Patient's maternal ancestors are of [place] descent, and paternal ancestors are of [place] descent. There is no reported Ashkenazi Jewish ancestry. There is no known consanguinity.    GENETIC TEST RESULTS: Genetic testing reported out on September 23, 2019 through the Multi-cancer panel found no pathogenic mutations. The Multi-Gene Panel offered by [facility] includes sequencing and/or deletion duplication testing of the following 85 genes: AIP, ALK, APC, ATM, AXIN2,BAP1,  BARD1, BLM, BMPR1A, BRCA1, BRCA2, BRIP1, CASR, CDC73, CDH1, CDK4, CDKN1B, CDKN1C, CDKN2A (p14ARF), CDKN2A (p16INK4a), CEBPA, CHEK2, CTNNA1, DICER1, DIS3L2, EGFR (c.2369C>T, p.Thr790Met variant only), EPCAM (Deletion/duplication testing only), FH, FLCN, GATA2, GPC3, GREM1 (Promoter region deletion/duplication testing only), HOXB13 (c.251G>A, p.Gly84Glu), HRAS, KIT, MAX, MEN1, MET, MITF (c.952G>A, p.Glu318Lys variant only), MLH1, MSH2, MSH3, MSH6, MUTYH, NBN, NF1, NF2, NTHL1, PALB2, PDGFRA, PHOX2B, PMS2, POLD1, POLE, POT1, PRKAR1A, PTCH1, PTEN, RAD50, RAD51C, RAD51D, RB1, RECQL4, RET, RNF43, RUNX1, SDHAF2, SDHA (sequence changes only), SDHB, SDHC, SDHD, SMAD4, SMARCA4, SMARCB1, SMARCE1, STK11, SUFU, TERC, TERT, TMEM127, TP53, TSC1, TSC2, VHL, WRN and WT1. The test report has been scanned into EPIC and is located under the Molecular Pathology section of the Results Review tab.  A portion of the result report is included below for reference.     We discussed with Mr. Visser that because current genetic testing is not perfect, it is possible there may be a gene mutation in one of these genes that current testing cannot detect, but that chance is small.  We also discussed, that there could be another gene that has not yet been discovered, or that we have not yet tested, that is responsible for the  cancer diagnoses in the family. It is also possible there is a hereditary cause for the cancer in the family that Mr. Mintzer did not inherit and therefore was not identified in his testing.  Therefore, it is important to remain in touch with cancer genetics in the future so that we can continue to offer Mr. Lattanzio the most up to date genetic testing.   ADDITIONAL GENETIC TESTING: We discussed with Mr. Escoto that his genetic testing was fairly extensive.  If there are genes identified to increase cancer risk that can be analyzed in the future, we would be happy to discuss and coordinate this testing at that time.    CANCER SCREENING RECOMMENDATIONS: Mr. Sattler test result is considered negative (normal).  This means that we have not identified a hereditary cause for his personal and family history of cancer at this time. Most cancers happen by chance and this negative test suggests that his cancer may fall into this category.    While reassuring, this does not definitively rule out a hereditary predisposition to cancer. It is still possible that there could be genetic  mutations that are undetectable by current technology. There could be genetic mutations in genes that have not been tested or identified to increase cancer risk.  Therefore, it is recommended he continue to follow the cancer management and screening guidelines provided by his oncology and primary healthcare provider.   An individual's cancer risk and medical management are not determined by genetic test results alone. Overall cancer risk assessment incorporates additional factors, including personal medical history, family history, and any available genetic information that may result in a personalized plan for cancer prevention and surveillance  RECOMMENDATIONS FOR FAMILY MEMBERS:  Individuals in this family might be at some increased risk of developing cancer, over the general population risk, simply due to the family history of cancer.  We  recommended women in this family have a yearly mammogram beginning at age 64, or 66 years younger than the earliest onset of cancer, an annual clinical breast exam, and perform monthly breast self-exams. Women in this family should also have a gynecological exam as recommended by their primary provider. All family members should be referred for colonoscopy starting at age 71.  FOLLOW-UP: Lastly, we discussed with Mr. Fordham that cancer genetics is a rapidly advancing field and it is possible that new genetic tests will be appropriate for him and/or his family members in the future. We encouraged him to remain in contact with cancer genetics on an annual basis so we can update his personal and family histories and let him know of advances in cancer genetics that may benefit this family.   Our contact number was provided. Mr. Tetzloff questions were answered to his satisfaction, and he knows he is welcome to call us at anytime with additional questions or concerns.   Roma Kayser, [place], [facility] Licensed, Certified Genetic Counselor [email or web address]

## 2019-09-26 NOTE — Telephone Encounter (Signed)
Revealed negative genetic testing.  Discussed that we do not know why he has prostate cancer or why there is cancer in the family. It could be due to a different gene that we are not testing, or maybe our current technology may not be able to pick something up.  It will be important for him to keep in contact with genetics to keep up with whether additional testing may be needed.   

## 2019-10-02 ENCOUNTER — Telehealth: Payer: Self-pay | Admitting: Cardiology

## 2019-10-02 NOTE — Telephone Encounter (Signed)
New Message  Pt c/o medication issue:  1. Name of Medication: apixaban (ELIQUIS) 5 MG TABS tablet   2. How are you currently taking this medication (dosage and times per day)? Take 1 tablet (5 mg total) by mouth 2 (two) times daily.  3. Are you having a reaction (difficulty breathing--STAT)? no  4. What is your medication issue? Pt called and would like like to know if Dr Percival Spanish would like for him to continue this med or he was to stop it after he finished it?    Ph[facility] -[facility] [place] [place]  [address] [place], [place] [address]  Contact number [phone]

## 2019-10-02 NOTE — Telephone Encounter (Signed)
Unable tp LMVM-mailbox is full Refill sent for one year to [facility] on 08-28-2019 #[id number]

## 2019-10-02 NOTE — Telephone Encounter (Signed)
I spoke with Mr Durbin.  He says his bottle said no refills. I have advised him that when he saw Dr Percival Spanish on August 16 he sent in a 90 day fill #180 with 3 refills so he should be good for a year. I advised him to check with his pharmacy and not request the refill off the current bottle Rx since that one has expired.  He has an appt for his lab tomorrow (CBC).

## 2019-10-03 ENCOUNTER — Telehealth (INDEPENDENT_AMBULATORY_CARE_PROVIDER_SITE_OTHER): Payer: Medicare Other | Admitting: Internal Medicine

## 2019-10-03 ENCOUNTER — Other Ambulatory Visit: Payer: Self-pay

## 2019-10-03 ENCOUNTER — Encounter: Payer: Self-pay | Admitting: Internal Medicine

## 2019-10-03 VITALS — BP 122/78 | Ht 72.0 in | Wt 235.0 lb

## 2019-10-03 DIAGNOSIS — J309 Allergic rhinitis, unspecified: Secondary | ICD-10-CM

## 2019-10-03 MED ORDER — AZELASTINE HCL 0.1 % NA SOLN
2.0000 | Freq: Two times a day (BID) | NASAL | 6 refills | Status: DC
Start: 2019-10-03 — End: 2020-09-02

## 2019-10-03 NOTE — Progress Notes (Signed)
Subjective:    Patient ID: David [name], male    DOB: [birth year], 74 y.o.   MRN: [id number]  DOS:  10/03/2019 Type of visit - description: Virtual Visit via Video Note  I connected with the above patient  by a video enabled telemedicine application and verified that I am speaking with the correct person using two identifiers.   THIS ENCOUNTER IS A VIRTUAL VISIT DUE TO COVID-19 - PATIENT WAS NOT SEEN IN THE OFFICE. PATIENT HAS CONSENTED TO VIRTUAL VISIT / TELEMEDICINE VISIT   Location of patient: home  Location of provider: office  Persons participating in the virtual visit: patient, provider   I discussed the limitations of evaluation and management by telemedicine and the availability of in person appointments. The patient expressed understanding and agreed to proceed.  Acute For the last approximately 4 weeks he is having postnasal dripping and a headache, mostly at the frontal area. He has some fatigue but that is not necessarily new.    Review of Systems No recent fever chills No unusual myalgias No jaw claudication No visual disturbances No difficulty breathing. He admits to weight loss but thinks is related to the nephrectomy that he had several months ago.  Past Medical History:  Diagnosis Date  . Adenoma of right adrenal gland 07/11/2002   2.4cm , noted on CT ABD  . Allergy   . Anxiety   . Arthritis of both knees 03/26/2016  . Astigmatism   . Back pain   . BCC (basal cell carcinoma of skin)    under right eye and right ear  . Blood transfusion without reported diagnosis   . Cataract 09/29/2016  . Depression   . Diverticulitis 2009  . Diverticulosis   . Family history of breast cancer   . Family history of colon cancer   . Fatty liver   . GERD (gastroesophageal reflux disease)   . Gout   . Hearing loss of both ears 03/26/2016  . History of atrial fibrillation    x 2 none since 2020  . History of chronic prostatitis    started at age 45  . History of  kidney stones   . Hyperglycemia   . Hyperlipidemia   . Hypertension   . Incomplete right bundle branch block (RBBB)   . Internal hemorrhoids   . Kidney lesion 06/07/2015   Right midportion, 1.1x1.1 cm hyperechoic, noted on [place] ABD  . LAFB (left anterior fascicular block)   . Left anterior fascicular block 08/06/2017   Noted on EKG  . Lipoma of axilla 09/2016  . Liver lesion, right lobe 06/07/2015   2.5x2.4x2.4 cm hypoechoic lesion posterior aspect, noted on [place] ABD  . Low back pain 03/26/2016  . LVH (left ventricular hypertrophy) 08/06/2017   Moderate, Noted on ECHO  . Medicare annual wellness visit, subsequent 03/12/2014  . Muscle cramps   . Nocturia   . OA (osteoarthritis)    Back, Hands, Neck  . Obesity 11/25/2007   Qualifier: Diagnosis of  By: Lenna Gilford MD, Deborra Medina   . Other malaise and fatigue 03/13/2013  . Penis symptom or sign    PENIS sensitive under urethra and itches  . Premature ventricular complex   . Prostate cancer ([place]) dx 2021  . Renal insufficiency    Kidney removed right  . Scoliosis    Upper thoracic and lumbar  . Sinus bradycardia 08/06/2017   Noted on EKG  . Vitamin D deficiency    resolved    Past Surgical  History:  Procedure Laterality Date  . CARDIOVERSION  07/31/2017  . CATARACT EXTRACTION, BILATERAL    . COLON SURGERY  2009   segmental sigmoid resection  . COLONOSCOPY    . CYSTOSCOPY N/A 07/28/2019   Procedure: CYSTOSCOPY FLEXIBLE;  Surgeon: Cleon Gustin, MD;  Location: [facility];  Service: Urology;  Laterality: N/A;  . CYSTOSCOPY W/ RETROGRADES Right 06/07/2018   Procedure: CYSTOSCOPY WITH RETROGRADE PYELOGRAM, uphrostogram;  Surgeon: Cleon Gustin, MD;  Location: [facility];  Service: Urology;  Laterality: Right;  . CYSTOSCOPY WITH URETEROSCOPY AND STENT PLACEMENT Right 02/28/2018   Procedure: CYSTOSCOPY WITH URETEROSCOPY [place];  Surgeon: Kathie Rhodes, MD;  Location: [facility];  Service: Urology;   Laterality: Right;  . CYSTOSCOPY/URETEROSCOPY/HOLMIUM LASER/STENT PLACEMENT Right 11/29/2017   Procedure: CYSTOSCOPY/RETROGRADE/URETEROSCOPY/HOLMIUM LASER/STENT PLACEMENT;  Surgeon: Kathie Rhodes, MD;  Location: [facility];  Service: Urology;  Laterality: Right;  . EYE SURGERY Bilateral 01/12/2017   cataract removal  . history of blood tranfusion  age 78  . HOLMIUM LASER APPLICATION Right 7/51/0258   Procedure: HOLMIUM LASER APPLICATION;  Surgeon: Kathie Rhodes, MD;  Location: [facility];  Service: Urology;  Laterality: Right;  . IR BALLOON DILATION URETERAL STRICTURE RIGHT  03/14/2018  . IR NEPHRO TUBE REMOV/FL  03/17/2018  . IR NEPHROSTOGRAM RIGHT THRU EXISTING ACCESS  03/17/2018  . IR NEPHROSTOMY EXCHANGE RIGHT  03/14/2018  . IR NEPHROSTOMY EXCHANGE RIGHT  06/21/2018  . IR NEPHROSTOMY PLACEMENT RIGHT  03/02/2018  . IR NEPHROSTOMY PLACEMENT RIGHT  04/20/2018  . IR URETERAL STENT PLACEMENT EXISTING ACCESS RIGHT  03/14/2018  . NOSE SURGERY     Submucous resection age 52  . NUCLEAR STRESS TEST  06/03/2009  . RADIOACTIVE SEED IMPLANT N/A 07/28/2019   Procedure: RADIOACTIVE SEED IMPLANT/BRACHYTHERAPY IMPLANT;  Surgeon: Cleon Gustin, MD;  Location: [facility];  Service: Urology;  Laterality: N/A;  90 MINS  . ROBOT ASSISTED LAPAROSCOPIC NEPHRECTOMY Right 08/04/2018   Procedure: XI ROBOTIC ASSISTED LAPAROSCOPIC NEPHRECTOMY;  Surgeon: Cleon Gustin, MD;  Location: [facility];  Service: Urology;  Laterality: Right;  3 HRS  . ROBOT ASSISTED PYELOPLASTY Right 06/07/2018   Procedure: attempted XI ROBOTIC ASSISTED PYELOPLASTY, lysis of adhesions;  Surgeon: Cleon Gustin, MD;  Location: [facility];  Service: Urology;  Laterality: Right;  3 HRS  . SKIN BIOPSY    . SPACE OAR INSTILLATION N/A 07/28/2019   Procedure: SPACE OAR INSTILLATION;  Surgeon: Cleon Gustin, MD;  Location: [facility];  Service: Urology;  Laterality: N/A;  . URETEROSCOPY WITH HOLMIUM LASER  LITHOTRIPSY Right 09/24/2017   Procedure: CYSTOSCOPY, URETEROSCOPY WITH HOLMIUM LASER LITHOTRIPSY, STENT PLACEMENT;  Surgeon: Kathie Rhodes, MD;  Location: [facility];  Service: Urology;  Laterality: Right;    Allergies as of 10/03/2019      Reactions   Cefaclor Hives   Cephalosporins Hives   Penicillins Rash   Mild maculopapular rash Has patient had a PCN reaction causing immediate rash, facial/tongue/throat swelling, SOB or lightheadedness with hypotension: No Has patient had a PCN reaction causing severe rash involving mucus membranes or skin necrosis: No Has patient had a PCN reaction that required hospitalization: No Has patient had a PCN reaction occurring within the last 10 years: No If all of the above answers are "NO", then may proceed with Cephalosporin use.      Medication List       Accurate as of October 03, 2019 11:59 PM. If you have  any questions, ask your nurse or doctor.        STOP taking these medications   fluconazole 150 MG tablet Commonly known as: DIFLUCAN Stopped by: Kathlene November, MD   Fluoxetine HCl (PMDD) 10 MG Tabs Stopped by: Kathlene November, MD     TAKE these medications   apixaban 5 MG Tabs tablet Commonly known as: ELIQUIS Take 1 tablet (5 mg total) by mouth 2 (two) times daily.   ascorbic acid 1000 MG tablet Commonly known as: VITAMIN C Take by mouth.   atenolol 25 MG tablet Commonly known as: TENORMIN Take 1 tablet (25 mg total) by mouth in the morning, at noon, and at bedtime. What changed: when to take this   atorvastatin 10 MG tablet Commonly known as: LIPITOR Take 1 tablet (10 mg total) by mouth in the morning.   azelastine 0.1 % nasal spray Commonly known as: ASTELIN Place 2 sprays into both nostrils 2 (two) times daily. Started by: Kathlene November, MD   Colchicine 0.6 MG Caps 1 tab po bid   diltiazem 180 MG 24 hr capsule Commonly known as: Cardizem CD Take 1 capsule (180 mg total) by mouth daily.   FLUoxetine 10 MG  tablet Commonly known as: PROZAC Take 1 tablet (10 mg total) by mouth 2 (two) times daily.   LORazepam 1 MG tablet Commonly known as: ATIVAN Take 1 mg by mouth 2 (two) times daily.   omeprazole 20 MG capsule Commonly known as: PRILOSEC Take 20 mg by mouth daily as needed (acid reflux).   tamsulosin 0.4 MG Caps capsule Commonly known as: FLOMAX Take 0.4 mg by mouth daily.   traMADol 50 MG tablet Commonly known as: Ultram Take 1 tablet (50 mg total) by mouth every 6 (six) hours as needed for moderate pain.          Objective:   Physical Exam BP 122/78   Ht 6' (1.829 m)   Wt 235 lb (106.6 kg)   BMI 31.87 kg/m  This is a virtual video visit, he seems alert oriented x3, speaking in complete sentences.    Assessment     74 year old male PMH includes high cholesterol, HTN, atrial fibrillation , GERD, IBS, BCC, renal insufficiency, prostate ca, presents  with   Allergic sinusitis? 4 weeks history of postnasal dripping, nasal congestion, headache. The patient knows the limitations of a virtual evaluation. I think is reasonable to start treatment for allergic sinusitis, if he is not promptly improving we will add an antibiotic and bring him to the office for checkup. If symptoms persist consider a more detailed evaluation including CBC, sed rate, imaging etc. The patient verbalized understanding.   I discussed the assessment and treatment plan with the patient. The patient was provided an opportunity to ask questions and all were answered. The patient agreed with the plan and demonstrated an understanding of the instructions.   The patient was advised to call back or seek an in-person evaluation if the symptoms worsen or if the condition fails to improve as anticipated.

## 2019-10-03 NOTE — Progress Notes (Signed)
Pre visit review using our clinic review tool, if applicable. No additional management support is needed unless otherwise documented below in the visit note. 

## 2019-10-04 ENCOUNTER — Other Ambulatory Visit: Payer: Medicare Other

## 2019-10-04 ENCOUNTER — Other Ambulatory Visit: Payer: Self-pay

## 2019-10-04 ENCOUNTER — Telehealth: Payer: Self-pay | Admitting: *Deleted

## 2019-10-04 DIAGNOSIS — N200 Calculus of kidney: Secondary | ICD-10-CM

## 2019-10-04 DIAGNOSIS — N289 Disorder of kidney and ureter, unspecified: Secondary | ICD-10-CM

## 2019-10-04 NOTE — Telephone Encounter (Signed)
Pt came in today for a lab visit. There were no future orders in EPIC. He states he usually gets labs done because of recent history of kidney removal. He specifically mentioned a CBC. Chart review shows pt was in hospital in August and CBC at that time. I apologized to the pt and explained I did not have any orders indicating what labs he was due for and we would contact him to schedule another lab appointment if labs are due now.  Please review and advise?

## 2019-10-04 NOTE — Telephone Encounter (Signed)
His 3 month blood work is not due til October 10 but if he wants he can do a cmp for renal insufficiency and cbc for kidney stones

## 2019-10-05 NOTE — Telephone Encounter (Signed)
Notified pt. He wishes to do cmp and cbc now. Lab appt scheduled for 10/11/19 at 7:45am as pt states Wednesdays are the best day for him.  Future lab orders placed.

## 2019-10-08 ENCOUNTER — Other Ambulatory Visit: Payer: Self-pay | Admitting: Family Medicine

## 2019-10-09 NOTE — Progress Notes (Signed)
  Radiation Oncology         [phone] ________________________________  Name: David [name] MRN: [id number]  Date: 09/05/2019  DOB: September 03, 1945  3D Planning Note   Prostate Brachytherapy Post-Implant Dosimetry  Diagnosis: 74 y.o. gentleman with Stage T1c adenocarcinoma of the prostate with Gleason score of 4+3, and PSA of 5.53  Narrative: On a previous date, David [name] returned following prostate seed implantation for post implant planning. He underwent CT scan complex simulation to delineate the three-dimensional structures of the pelvis and demonstrate the radiation distribution.  Since that time, the seed localization, and complex isodose planning with dose volume histograms have now been completed.  Results:   Prostate Coverage - The dose of radiation delivered to the 90% or more of the prostate gland (D90) was 107.88% of the prescription dose. This exceeds our goal of greater than 90%. Rectal Sparing - The volume of rectal tissue receiving the prescription dose or higher was 0.0 cc. This falls under our thresholds tolerance of 1.0 cc.  Impression: The prostate seed implant appears to show adequate target coverage and appropriate rectal sparing.  Plan:  The patient will continue to follow with urology for ongoing PSA determinations. I would anticipate a high likelihood for local tumor control with minimal risk for rectal morbidity.  ________________________________  Sheral Apley Tammi Klippel, M.D.

## 2019-10-11 ENCOUNTER — Other Ambulatory Visit: Payer: Medicare Other

## 2019-10-12 ENCOUNTER — Other Ambulatory Visit: Payer: Self-pay

## 2019-10-12 ENCOUNTER — Ambulatory Visit (INDEPENDENT_AMBULATORY_CARE_PROVIDER_SITE_OTHER): Payer: Medicare Other | Admitting: Family Medicine

## 2019-10-12 VITALS — BP 134/88 | HR 58 | Temp 97.8°F | Wt 241.4 lb

## 2019-10-12 DIAGNOSIS — R739 Hyperglycemia, unspecified: Secondary | ICD-10-CM | POA: Diagnosis not present

## 2019-10-12 DIAGNOSIS — E782 Mixed hyperlipidemia: Secondary | ICD-10-CM

## 2019-10-12 DIAGNOSIS — B372 Candidiasis of skin and nail: Secondary | ICD-10-CM

## 2019-10-12 DIAGNOSIS — R351 Nocturia: Secondary | ICD-10-CM

## 2019-10-12 DIAGNOSIS — Z23 Encounter for immunization: Secondary | ICD-10-CM | POA: Diagnosis not present

## 2019-10-12 DIAGNOSIS — N289 Disorder of kidney and ureter, unspecified: Secondary | ICD-10-CM | POA: Diagnosis not present

## 2019-10-12 DIAGNOSIS — R519 Headache, unspecified: Secondary | ICD-10-CM

## 2019-10-12 DIAGNOSIS — L280 Lichen simplex chronicus: Secondary | ICD-10-CM

## 2019-10-12 DIAGNOSIS — R102 Pelvic and perineal pain: Secondary | ICD-10-CM

## 2019-10-12 DIAGNOSIS — G8929 Other chronic pain: Secondary | ICD-10-CM

## 2019-10-12 DIAGNOSIS — M1A9XX0 Chronic gout, unspecified, without tophus (tophi): Secondary | ICD-10-CM

## 2019-10-12 DIAGNOSIS — F418 Other specified anxiety disorders: Secondary | ICD-10-CM

## 2019-10-12 DIAGNOSIS — R5383 Other fatigue: Secondary | ICD-10-CM

## 2019-10-12 DIAGNOSIS — B356 Tinea cruris: Secondary | ICD-10-CM

## 2019-10-12 DIAGNOSIS — R0683 Snoring: Secondary | ICD-10-CM

## 2019-10-12 DIAGNOSIS — I10 Essential (primary) hypertension: Secondary | ICD-10-CM

## 2019-10-12 MED ORDER — CLOTRIMAZOLE-BETAMETHASONE 1-0.05 % EX LOTN: TOPICAL_LOTION | Freq: Two times a day (BID) | CUTANEOUS | 0 refills | Status: DC

## 2019-10-12 MED ORDER — FLUCONAZOLE 150 MG PO TABS: 150.0000 mg | ORAL_TABLET | ORAL | 1 refills | Status: DC

## 2019-10-12 NOTE — Assessment & Plan Note (Signed)
hgba1c acceptable, minimize simple carbs. Increase exercise as tolerated. Continue current meds 

## 2019-10-12 NOTE — Assessment & Plan Note (Signed)
Well controlled, no changes to meds. Encouraged heart healthy diet such as the DASH diet and exercise as tolerated.  °

## 2019-10-12 NOTE — Assessment & Plan Note (Signed)
Is hydrating well and will monitor

## 2019-10-12 NOTE — Assessment & Plan Note (Signed)
He is noting increased fatigue, snoring, restless sleep and headaches. He was previously been referred to pulmonology but was unable to go due to all of his urologic complications. He is ready to go now and new referral is sent.

## 2019-10-12 NOTE — Assessment & Plan Note (Signed)
Has flared again, responds to diflucan, given a refill for 150 mg tabs, 1 tab po q week prn and he will not take his statin on the day he takes the diflucan. His topical Lotrisone is also refilled

## 2019-10-12 NOTE — Progress Notes (Signed)
Subjective:    Patient ID: David [name], male    DOB: 11-21-1945, 74 y.o.   MRN: [id number]  Chief Complaint  Patient presents with  . Follow-up    urinary concern    HPI Patient is in today for follow up on chronic medical concerns. No recent febrile illness or hospitalizations. He has recovered well from his urology procedures. He does note some skin itching and rash in his groin and on glans again. Has responded to Lotrisone and Diflucan in past. Notes some polyuria but no polydipsia. Denies CP/palp/SOB/HA/congestion/fevers/GI or GU c/o. Taking meds as prescribed  Past Medical History:  Diagnosis Date  . Adenoma of right adrenal gland 07/11/2002   2.4cm , noted on CT ABD  . Allergy   . Anxiety   . Arthritis of both knees 03/26/2016  . Astigmatism   . Back pain   . BCC (basal cell carcinoma of skin)    under right eye and right ear  . Blood transfusion without reported diagnosis   . Cataract 09/29/2016  . Depression   . Diverticulitis 2009  . Diverticulosis   . Family history of breast cancer   . Family history of colon cancer   . Fatty liver   . GERD (gastroesophageal reflux disease)   . Gout   . Hearing loss of both ears 03/26/2016  . History of atrial fibrillation    x 2 none since 2020  . History of chronic prostatitis    started at age 36  . History of kidney stones   . Hyperglycemia   . Hyperlipidemia   . Hypertension   . Incomplete right bundle branch block (RBBB)   . Internal hemorrhoids   . Kidney lesion 06/07/2015   Right midportion, 1.1x1.1 cm hyperechoic, noted on [place] ABD  . LAFB (left anterior fascicular block)   . Left anterior fascicular block 08/06/2017   Noted on EKG  . Lipoma of axilla 09/2016  . Liver lesion, right lobe 06/07/2015   2.5x2.4x2.4 cm hypoechoic lesion posterior aspect, noted on [place] ABD  . Low back pain 03/26/2016  . LVH (left ventricular hypertrophy) 08/06/2017   Moderate, Noted on ECHO  . Medicare annual wellness visit,  subsequent 03/12/2014  . Muscle cramps   . Nocturia   . OA (osteoarthritis)    Back, Hands, Neck  . Obesity 11/25/2007   Qualifier: Diagnosis of  By: Lenna Gilford MD, Deborra Medina   . Other malaise and fatigue 03/13/2013  . Penis symptom or sign    PENIS sensitive under urethra and itches  . Premature ventricular complex   . Prostate cancer ([place]) dx 2021  . Renal insufficiency    Kidney removed right  . Scoliosis    Upper thoracic and lumbar  . Sinus bradycardia 08/06/2017   Noted on EKG  . Vitamin D deficiency    resolved    Past Surgical History:  Procedure Laterality Date  . CARDIOVERSION  07/31/2017  . CATARACT EXTRACTION, BILATERAL    . COLON SURGERY  2009   segmental sigmoid resection  . COLONOSCOPY    . CYSTOSCOPY N/A 07/28/2019   Procedure: CYSTOSCOPY FLEXIBLE;  Surgeon: Cleon Gustin, MD;  Location: [facility];  Service: Urology;  Laterality: N/A;  . CYSTOSCOPY W/ RETROGRADES Right 06/07/2018   Procedure: CYSTOSCOPY WITH RETROGRADE PYELOGRAM, uphrostogram;  Surgeon: Cleon Gustin, MD;  Location: [facility];  Service: Urology;  Laterality: Right;  . CYSTOSCOPY WITH URETEROSCOPY AND STENT PLACEMENT Right 02/28/2018   Procedure:  CYSTOSCOPY WITH URETEROSCOPY [place] Se;  Surgeon: Kathie Rhodes, MD;  Location: [facility];  Service: Urology;  Laterality: Right;  . CYSTOSCOPY/URETEROSCOPY/HOLMIUM LASER/STENT PLACEMENT Right 11/29/2017   Procedure: CYSTOSCOPY/RETROGRADE/URETEROSCOPY/HOLMIUM LASER/STENT PLACEMENT;  Surgeon: Kathie Rhodes, MD;  Location: [facility];  Service: Urology;  Laterality: Right;  . EYE SURGERY Bilateral 01/12/2017   cataract removal  . history of blood tranfusion  age 45  . HOLMIUM LASER APPLICATION Right 9/38/1017   Procedure: HOLMIUM LASER APPLICATION;  Surgeon: Kathie Rhodes, MD;  Location: [facility];  Service: Urology;  Laterality: Right;  . IR BALLOON DILATION URETERAL STRICTURE RIGHT  03/14/2018  . IR NEPHRO  TUBE REMOV/FL  03/17/2018  . IR NEPHROSTOGRAM RIGHT THRU EXISTING ACCESS  03/17/2018  . IR NEPHROSTOMY EXCHANGE RIGHT  03/14/2018  . IR NEPHROSTOMY EXCHANGE RIGHT  06/21/2018  . IR NEPHROSTOMY PLACEMENT RIGHT  03/02/2018  . IR NEPHROSTOMY PLACEMENT RIGHT  04/20/2018  . IR URETERAL STENT PLACEMENT EXISTING ACCESS RIGHT  03/14/2018  . NOSE SURGERY     Submucous resection age 68  . NUCLEAR STRESS TEST  06/03/2009  . RADIOACTIVE SEED IMPLANT N/A 07/28/2019   Procedure: RADIOACTIVE SEED IMPLANT/BRACHYTHERAPY IMPLANT;  Surgeon: Cleon Gustin, MD;  Location: [facility];  Service: Urology;  Laterality: N/A;  90 MINS  . ROBOT ASSISTED LAPAROSCOPIC NEPHRECTOMY Right 08/04/2018   Procedure: XI ROBOTIC ASSISTED LAPAROSCOPIC NEPHRECTOMY;  Surgeon: Cleon Gustin, MD;  Location: [facility];  Service: Urology;  Laterality: Right;  3 HRS  . ROBOT ASSISTED PYELOPLASTY Right 06/07/2018   Procedure: attempted XI ROBOTIC ASSISTED PYELOPLASTY, lysis of adhesions;  Surgeon: Cleon Gustin, MD;  Location: [facility];  Service: Urology;  Laterality: Right;  3 HRS  . SKIN BIOPSY    . SPACE OAR INSTILLATION N/A 07/28/2019   Procedure: SPACE OAR INSTILLATION;  Surgeon: Cleon Gustin, MD;  Location: [facility];  Service: Urology;  Laterality: N/A;  . URETEROSCOPY WITH HOLMIUM LASER LITHOTRIPSY Right 09/24/2017   Procedure: CYSTOSCOPY, URETEROSCOPY WITH HOLMIUM LASER LITHOTRIPSY, STENT PLACEMENT;  Surgeon: Kathie Rhodes, MD;  Location: [facility];  Service: Urology;  Laterality: Right;    Family History  Problem Relation Age of Onset  . Heart disease Mother   . Hypertension Mother   . Stroke Mother   . Colon cancer Mother 48  . Breast cancer Mother 35  . Heart disease Father        pacemaker  . Aortic aneurysm Father   . Hypertension Father   . Lung cancer Father 60  . Heart disease Sister   . Atrial fibrillation Sister   . Obesity Sister   . Sleep apnea Sister     . Heart attack Brother   . Other Brother        muscle disease  . Arthritis Brother   . Stroke Brother   . Atrial fibrillation Brother   . Heart attack Maternal Grandmother   . Diabetes Maternal Grandmother   . Cancer Maternal Grandfather 58       hodgin's lymphoma  . Heart attack Paternal Grandmother   . Anxiety disorder Paternal Grandmother   . Pneumonia Paternal Grandfather   . Heart attack Brother   . Atrial fibrillation Brother   . Atrial fibrillation Brother   . Heart attack Brother   . Hypertension Brother   . Hyperlipidemia Brother   . Heart attack Brother   . Other Brother        heart valve operation  .  Atrial fibrillation Brother   . Lymphoma Niece 12  . Liver cancer Nephew 2       great nephew; d. 88  . Esophageal cancer Neg Hx   . Rectal cancer Neg Hx   . Stomach cancer Neg Hx     Social History   Socioeconomic History  . Marital status: Married    Spouse name: Not on file  . Number of children: 3  . Years of education: Not on file  . Highest education level: Not on file  Occupational History  . 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 Smoking status: Former Smoker    Packs/day: 1.00    Years: 6.00    Pack years: 6.00    Types: Cigarettes    Start date: 01/12/1969    Quit date: 01/12/1969    Years since quitting: 50.7  . Smokeless tobacco: Never Used  . Tobacco comment: [id number]  Vaping Use  . Vaping Use: Never used  Substance and Sexual Activity  . Alcohol use: Yes    Comment: occ  . Drug use: Never  . Sexual activity: Yes  Other Topics Concern  . Not on file  Social History Narrative   The patient is married for the second time, he has 3 sons.   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   2 alcoholic beverages most days.   1 caffeinated beverage daily   No drug use no current tobacco use he is a prior smoker   12/24/2016   Social Determinants of Health   Financial Resource Strain:   . Difficulty of Paying Living Expenses:  Not on file  Food Insecurity:   . Worried About Charity fundraiser in the Last Year: Not on file  . Ran Out of Food in the Last Year: Not on file  Transportation Needs:   . Lack of Transportation (Medical): Not on file  . Lack of Transportation (Non-Medical): Not on file  Physical Activity:   . Days of Exercise per Week: Not on file  . Minutes of Exercise per Session: Not on file  Stress:   . Feeling of Stress : Not on file  Social Connections:   . Frequency of Communication with Friends and Family: Not on file  . Frequency of Social Gatherings with Friends and Family: Not on file  . Attends Religious Services: Not on file  . Active Member of Clubs or Organizations: Not on file  . Attends Archivist Meetings: Not on file  . Marital Status: Not on file  Intimate Partner Violence:   . Fear of Current or Ex-Partner: Not on file  . Emotionally Abused: Not on file  . Physically Abused: Not on file  . Sexually Abused: Not on file    Outpatient Medications Prior to Visit  Medication Sig Dispense Refill  . apixaban (ELIQUIS) 5 MG TABS tablet Take 1 tablet (5 mg total) by mouth 2 (two) times daily. 180 tablet 3  . ascorbic acid (VITAMIN C) 1000 MG tablet Take by mouth. (Patient not taking: Reported on 10/03/2019)    . atenolol (TENORMIN) 25 MG tablet Take 1 tablet (25 mg total) by mouth 2 (two) times daily. 180 tablet 1  . atorvastatin (LIPITOR) 10 MG tablet Take 1 tablet (10 mg total) by mouth in the morning. 90 tablet 1  . azelastine (ASTELIN) 0.1 % nasal spray Place 2 sprays into both nostrils 2 (two) times daily. 30 mL 6  . Colchicine 0.6  MG CAPS 1 tab po bid (Patient not taking: Reported on 10/03/2019) 60 capsule 0  . diltiazem (CARDIZEM CD) 180 MG 24 hr capsule Take 1 capsule (180 mg total) by mouth daily. (Patient not taking: Reported on 10/03/2019) 30 capsule 0  . FLUoxetine (PROZAC) 10 MG tablet Take 1 tablet (10 mg total) by mouth 2 (two) times daily. 180 tablet 0  .  LORazepam (ATIVAN) 1 MG tablet Take 1 mg by mouth 2 (two) times daily.    Marland Kitchen omeprazole (PRILOSEC) 20 MG capsule Take 20 mg by mouth daily as needed (acid reflux).     . tamsulosin (FLOMAX) 0.4 MG CAPS capsule Take 0.4 mg by mouth daily.    . traMADol (ULTRAM) 50 MG tablet Take 1 tablet (50 mg total) by mouth every 6 (six) hours as needed for moderate pain. (Patient not taking: Reported on 10/03/2019) 15 tablet 0   No facility-administered medications prior to visit.    Allergies  Allergen Reactions  . Cefaclor Hives  . Cephalosporins Hives  . Penicillins Rash    Mild maculopapular rash Has patient had a PCN reaction causing immediate rash, facial/tongue/throat swelling, SOB or lightheadedness with hypotension: No Has patient had a PCN reaction causing severe rash involving mucus membranes or skin necrosis: No Has patient had a PCN reaction that required hospitalization: No Has patient had a PCN reaction occurring within the last 10 years: No If all of the above answers are "NO", then may proceed with Cephalosporin use.     Review of Systems  Constitutional: Positive for malaise/fatigue. Negative for fever.  HENT: Negative for congestion.   Eyes: Negative for blurred vision.  Respiratory: Negative for shortness of breath.   Cardiovascular: Negative for chest pain, palpitations and leg swelling.  Gastrointestinal: Negative for abdominal pain, blood in stool and nausea.  Genitourinary: Negative for dysuria and frequency.  Musculoskeletal: Negative for falls.  Skin: Positive for itching and rash.  Neurological: Positive for headaches. Negative for dizziness and loss of consciousness.  Endo/Heme/Allergies: Negative for environmental allergies.  Psychiatric/Behavioral: Positive for depression. The patient is not nervous/anxious.        Objective:    Physical Exam Vitals and nursing note reviewed.  Constitutional:      General: He is not in acute distress.    Appearance: He is  well-developed.  HENT:     Head: Normocephalic and atraumatic.     Nose: Nose normal.  Eyes:     General:        Right eye: No discharge.        Left eye: No discharge.  Cardiovascular:     Rate and Rhythm: Normal rate and regular rhythm.  Pulmonary:     Effort: Pulmonary effort is normal.     Breath sounds: Normal breath sounds.  Abdominal:     General: Bowel sounds are normal.     Palpations: Abdomen is soft.     Tenderness: There is no abdominal tenderness.  Musculoskeletal:     Cervical back: Normal range of motion and neck supple.  Skin:    General: Skin is warm and dry.  Neurological:     Mental Status: He is alert and oriented to person, place, and time.     BP 134/88 (BP Location: Left Arm, Patient Position: Sitting, Cuff Size: Large)   Pulse (!) 58   Temp 97.8 F (36.6 C)   Wt 241 lb 6.4 oz (109.5 kg)   SpO2 96%   BMI 32.74 kg/m  Wt  Readings from Last 3 Encounters:  10/12/19 241 lb 6.4 oz (109.5 kg)  10/03/19 235 lb (106.6 kg)  08/28/19 240 lb 12.8 oz (109.2 kg)    Diabetic Foot Exam - Simple   No data filed     Lab Results  Component Value Date   WBC 6.5 08/20/2019   HGB 14.6 08/20/2019   HCT 43.3 08/20/2019   PLT 233 08/20/2019   GLUCOSE 113 (H) 08/20/2019   CHOL 225 (H) 07/21/2019   TRIG 159.0 (H) 07/21/2019   HDL 38.20 (L) 07/21/2019   LDLDIRECT 168.5 03/29/2006   LDLCALC 155 (H) 07/21/2019   ALT 24 07/25/2019   AST 22 07/25/2019   NA 137 08/20/2019   K 4.2 08/20/2019   CL 105 08/20/2019   CREATININE 1.98 (H) 08/20/2019   BUN 19 08/20/2019   CO2 22 08/20/2019   TSH 3.059 08/20/2019   PSA 5.53 (H) 03/13/2019   INR 1.0 07/25/2019   HGBA1C 5.6 07/21/2019    Lab Results  Component Value Date   TSH 3.059 08/20/2019   Lab Results  Component Value Date   WBC 6.5 08/20/2019   HGB 14.6 08/20/2019   HCT 43.3 08/20/2019   MCV 90.0 08/20/2019   PLT 233 08/20/2019   Lab Results  Component Value Date   NA 137 08/20/2019   K 4.2  08/20/2019   CO2 22 08/20/2019   GLUCOSE 113 (H) 08/20/2019   BUN 19 08/20/2019   CREATININE 1.98 (H) 08/20/2019   BILITOT 0.8 07/25/2019   ALKPHOS 69 07/25/2019   AST 22 07/25/2019   ALT 24 07/25/2019   PROT 7.0 07/25/2019   ALBUMIN 4.1 07/25/2019   CALCIUM 9.9 08/20/2019   ANIONGAP 10 08/20/2019   GFR 32.62 (L) 07/21/2019   Lab Results  Component Value Date   CHOL 225 (H) 07/21/2019   Lab Results  Component Value Date   HDL 38.20 (L) 07/21/2019   Lab Results  Component Value Date   LDLCALC 155 (H) 07/21/2019   Lab Results  Component Value Date   TRIG 159.0 (H) 07/21/2019   Lab Results  Component Value Date   CHOLHDL 6 07/21/2019   Lab Results  Component Value Date   HGBA1C 5.6 07/21/2019       Assessment & Plan:   Problem List Items Addressed This Visit    Hyperlipidemia, mixed    Tolerating statin, encouraged heart healthy diet, avoid trans fats, minimize simple carbs and saturated fats. Increase exercise as tolerated      Essential hypertension, benign    Well controlled, no changes to meds. Encouraged heart healthy diet such as the DASH diet and exercise as tolerated.       Gout   Relevant Orders   Uric acid   Renal insufficiency    Is hydrating well and will monitor      Relevant Orders   Comprehensive metabolic panel   Depression with anxiety    He feels overwhelmed by his recent illnesses but over all is on the mend and does not feel he needs intervention for his depression at the present time.       Tinea cruris    Has flared again, responds to diflucan, given a refill for 150 mg tabs, 1 tab po q week prn and he will not take his statin on the day he takes the diflucan. His topical Lotrisone is also refilled      Relevant Medications   fluconazole (DIFLUCAN) 150 MG tablet   clotrimazole-betamethasone (  LOTRISONE) lotion   Lichen simplex chronicus   Relevant Medications   clotrimazole-betamethasone (LOTRISONE) lotion   Snoring    He  is noting increased fatigue, snoring, restless sleep and headaches. He was previously been referred to pulmonology but was unable to go due to all of his urologic complications. He is ready to go now and new referral is sent.       Relevant Orders   Ambulatory referral to Pulmonology   Hyperglycemia    hgba1c acceptable, minimize simple carbs. Increase exercise as tolerated. Continue current meds      Nocturia   Relevant Orders   Urine Culture   Urinalysis   Nonfunctioning kidney    Hydrate well and check labs.        Other Visit Diagnoses    Influenza vaccine administered    -  Primary   Relevant Orders   Flu Vaccine QUAD High Dose(Fluad) (Completed)   Candidiasis of skin       Relevant Medications   fluconazole (DIFLUCAN) 150 MG tablet   clotrimazole-betamethasone (LOTRISONE) lotion   Suprapubic pain       Relevant Orders   Urine Culture   CBC   Urinalysis   Fatigue, unspecified type       Relevant Orders   Ambulatory referral to Pulmonology   Chronic nonintractable headache, unspecified headache type       Relevant Orders   Ambulatory referral to Pulmonology      I am having David [name] "David [name]" start on fluconazole. I am also having him maintain his omeprazole, Colchicine, ascorbic acid, FLUoxetine, LORazepam, atorvastatin, traMADol, tamsulosin, diltiazem, apixaban, azelastine, atenolol, and clotrimazole-betamethasone.  Meds ordered this encounter  Medications  . fluconazole (DIFLUCAN) 150 MG tablet    Sig: Take 1 tablet (150 mg total) by mouth once a week.    Dispense:  4 tablet    Refill:  1  . clotrimazole-betamethasone (LOTRISONE) lotion    Sig: Apply topically 2 (two) times daily. For 2 weeks.    Dispense:  30 mL    Refill:  0     Penni Homans, MD

## 2019-10-12 NOTE — Assessment & Plan Note (Signed)
Hydrate well and check labs.

## 2019-10-12 NOTE — Patient Instructions (Signed)

## 2019-10-12 NOTE — Assessment & Plan Note (Signed)
He feels overwhelmed by his recent illnesses but over all is on the mend and does not feel he needs intervention for his depression at the present time.

## 2019-10-12 NOTE — Assessment & Plan Note (Signed)
Tolerating statin, encouraged heart healthy diet, avoid trans fats, minimize simple carbs and saturated fats. Increase exercise as tolerated 

## 2019-10-13 ENCOUNTER — Other Ambulatory Visit: Payer: Self-pay | Admitting: Family Medicine

## 2019-10-13 LAB — URINE CULTURE
MICRO NUMBER:: 11015731
Result:: NO GROWTH
SPECIMEN QUALITY:: ADEQUATE

## 2019-10-13 LAB — CBC
HCT: 40.9 % (ref 38.5–50.0)
Hemoglobin: 13.8 g/dL (ref 13.2–17.1)
MCH: 30.8 pg (ref 27.0–33.0)
MCHC: 33.7 g/dL (ref 32.0–36.0)
MCV: 91.3 fL (ref 80.0–100.0)
MPV: 9.9 fL (ref 7.5–12.5)
Platelets: 225 10*3/uL (ref 140–400)
RBC: 4.48 10*6/uL (ref 4.20–5.80)
RDW: 13.5 % (ref 11.0–15.0)
WBC: 6.4 10*3/uL (ref 3.8–10.8)

## 2019-10-13 LAB — COMPREHENSIVE METABOLIC PANEL
AG Ratio: 1.6 (calc) (ref 1.0–2.5)
ALT: 18 U/L (ref 9–46)
AST: 14 U/L (ref 10–35)
Albumin: 4.2 g/dL (ref 3.6–5.1)
Alkaline phosphatase (APISO): 87 U/L (ref 35–144)
BUN/Creatinine Ratio: 12 (calc) (ref 6–22)
BUN: 23 mg/dL (ref 7–25)
CO2: 26 mmol/L (ref 20–32)
Calcium: 10.1 mg/dL (ref 8.6–10.3)
Chloride: 104 mmol/L (ref 98–110)
Creat: 1.86 mg/dL — ABNORMAL HIGH (ref 0.70–1.18)
Globulin: 2.6 g/dL (calc) (ref 1.9–3.7)
Glucose, Bld: 100 mg/dL — ABNORMAL HIGH (ref 65–99)
Potassium: 4.6 mmol/L (ref 3.5–5.3)
Sodium: 138 mmol/L (ref 135–146)
Total Bilirubin: 0.6 mg/dL (ref 0.2–1.2)
Total Protein: 6.8 g/dL (ref 6.1–8.1)

## 2019-10-13 LAB — URIC ACID: Uric Acid, Serum: 8.3 mg/dL — ABNORMAL HIGH (ref 4.0–8.0)

## 2019-10-13 LAB — URINALYSIS
Bilirubin Urine: NEGATIVE
Glucose, UA: NEGATIVE
Hgb urine dipstick: NEGATIVE
Ketones, ur: NEGATIVE
Leukocytes,Ua: NEGATIVE
Nitrite: NEGATIVE
Protein, ur: NEGATIVE
Specific Gravity, Urine: 1.021 (ref 1.001–1.03)
pH: 5.5 (ref 5.0–8.0)

## 2019-10-25 ENCOUNTER — Other Ambulatory Visit: Payer: Self-pay | Admitting: Family Medicine

## 2019-11-14 ENCOUNTER — Other Ambulatory Visit: Payer: Self-pay | Admitting: Family Medicine

## 2019-11-14 NOTE — Telephone Encounter (Signed)
Requesting: lorazepam 1mg  Contract: 03/30/2017 UDS: 12/28/2017 Last Visit: 10/12/2019 Next Visit: 02/12/2020 Last Refill: Unsure when last refilled  Please Advise

## 2019-12-26 ENCOUNTER — Encounter: Payer: Self-pay | Admitting: Family Medicine

## 2019-12-27 ENCOUNTER — Other Ambulatory Visit: Payer: Self-pay | Admitting: Family Medicine

## 2019-12-27 DIAGNOSIS — B356 Tinea cruris: Secondary | ICD-10-CM

## 2019-12-27 DIAGNOSIS — L280 Lichen simplex chronicus: Secondary | ICD-10-CM

## 2019-12-27 MED ORDER — CLOTRIMAZOLE-BETAMETHASONE 1-0.05 % EX LOTN: TOPICAL_LOTION | Freq: Two times a day (BID) | CUTANEOUS | 0 refills | Status: DC

## 2019-12-27 MED ORDER — FLUCONAZOLE 150 MG PO TABS: 150.0000 mg | ORAL_TABLET | ORAL | 1 refills | Status: DC

## 2019-12-28 ENCOUNTER — Other Ambulatory Visit: Payer: Self-pay | Admitting: *Deleted

## 2019-12-28 DIAGNOSIS — E782 Mixed hyperlipidemia: Secondary | ICD-10-CM

## 2019-12-28 DIAGNOSIS — R739 Hyperglycemia, unspecified: Secondary | ICD-10-CM

## 2019-12-28 DIAGNOSIS — I10 Essential (primary) hypertension: Secondary | ICD-10-CM

## 2019-12-28 DIAGNOSIS — M1A9XX0 Chronic gout, unspecified, without tophus (tophi): Secondary | ICD-10-CM

## 2019-12-28 NOTE — Telephone Encounter (Signed)
Appt scheduled and future labs ordered.

## 2020-01-08 ENCOUNTER — Other Ambulatory Visit: Payer: Medicare Other

## 2020-01-25 ENCOUNTER — Other Ambulatory Visit: Payer: Self-pay | Admitting: Family Medicine

## 2020-02-05 ENCOUNTER — Other Ambulatory Visit (INDEPENDENT_AMBULATORY_CARE_PROVIDER_SITE_OTHER): Payer: Medicare Other

## 2020-02-05 ENCOUNTER — Other Ambulatory Visit: Payer: Self-pay

## 2020-02-05 ENCOUNTER — Other Ambulatory Visit: Payer: Medicare Other

## 2020-02-05 DIAGNOSIS — E782 Mixed hyperlipidemia: Secondary | ICD-10-CM | POA: Diagnosis not present

## 2020-02-05 DIAGNOSIS — I10 Essential (primary) hypertension: Secondary | ICD-10-CM

## 2020-02-05 DIAGNOSIS — M1A9XX0 Chronic gout, unspecified, without tophus (tophi): Secondary | ICD-10-CM | POA: Diagnosis not present

## 2020-02-05 DIAGNOSIS — R739 Hyperglycemia, unspecified: Secondary | ICD-10-CM

## 2020-02-05 LAB — COMPREHENSIVE METABOLIC PANEL
ALT: 18 U/L (ref 0–53)
AST: 15 U/L (ref 0–37)
Albumin: 4.2 g/dL (ref 3.5–5.2)
Alkaline Phosphatase: 81 U/L (ref 39–117)
BUN: 19 mg/dL (ref 6–23)
CO2: 29 mEq/L (ref 19–32)
Calcium: 10.1 mg/dL (ref 8.4–10.5)
Chloride: 104 mEq/L (ref 96–112)
Creatinine, Ser: 1.86 mg/dL — ABNORMAL HIGH (ref 0.40–1.50)
GFR: 35.15 mL/min — ABNORMAL LOW (ref >60.00)
Glucose, Bld: 90 mg/dL (ref 70–99)
Potassium: 4.2 mEq/L (ref 3.5–5.1)
Sodium: 138 mEq/L (ref 135–145)
Total Bilirubin: 0.5 mg/dL (ref 0.2–1.2)
Total Protein: 6.6 g/dL (ref 6.0–8.3)

## 2020-02-05 LAB — CBC
HCT: 42.6 % (ref 39.0–52.0)
Hemoglobin: 13.9 g/dL (ref 13.0–17.0)
MCHC: 32.6 g/dL (ref 30.0–36.0)
MCV: 93.6 fl (ref 78.0–100.0)
Platelets: 187 10*3/uL (ref 150.0–400.0)
RBC: 4.55 Mil/uL (ref 4.22–5.81)
RDW: 14.9 % (ref 11.5–15.5)
WBC: 6.1 10*3/uL (ref 4.0–10.5)

## 2020-02-05 LAB — URIC ACID: Uric Acid, Serum: 8 mg/dL — ABNORMAL HIGH (ref 4.0–7.8)

## 2020-02-06 LAB — HEMOGLOBIN A1C
Hgb A1c MFr Bld: 5.6 % of total Hgb (ref <5.7)
Mean Plasma Glucose: 114 mg/dL
eAG (mmol/L): 6.3 mmol/L

## 2020-02-12 ENCOUNTER — Ambulatory Visit: Payer: Medicare Other | Admitting: Family Medicine

## 2020-02-12 ENCOUNTER — Other Ambulatory Visit (HOSPITAL_COMMUNITY)
Admission: RE | Admit: 2020-02-12 | Discharge: 2020-02-12 | Disposition: A | Discharge status: Home / Self Care | Payer: Medicare Other | Source: Ambulatory Visit | Attending: Family Medicine | Admitting: Family Medicine

## 2020-02-12 ENCOUNTER — Encounter: Payer: Self-pay | Admitting: Family Medicine

## 2020-02-12 ENCOUNTER — Other Ambulatory Visit: Payer: Self-pay

## 2020-02-12 VITALS — BP 132/74 | HR 60 | Temp 97.8°F | Resp 16 | Wt 253.6 lb

## 2020-02-12 DIAGNOSIS — F418 Other specified anxiety disorders: Secondary | ICD-10-CM

## 2020-02-12 DIAGNOSIS — R7989 Other specified abnormal findings of blood chemistry: Secondary | ICD-10-CM

## 2020-02-12 DIAGNOSIS — R3915 Urgency of urination: Secondary | ICD-10-CM | POA: Insufficient documentation

## 2020-02-12 DIAGNOSIS — N289 Disorder of kidney and ureter, unspecified: Secondary | ICD-10-CM | POA: Diagnosis not present

## 2020-02-12 DIAGNOSIS — M109 Gout, unspecified: Secondary | ICD-10-CM

## 2020-02-12 DIAGNOSIS — R350 Frequency of micturition: Secondary | ICD-10-CM | POA: Insufficient documentation

## 2020-02-12 DIAGNOSIS — E782 Mixed hyperlipidemia: Secondary | ICD-10-CM

## 2020-02-12 DIAGNOSIS — C61 Malignant neoplasm of prostate: Secondary | ICD-10-CM

## 2020-02-12 DIAGNOSIS — M1A9XX0 Chronic gout, unspecified, without tophus (tophi): Secondary | ICD-10-CM

## 2020-02-12 DIAGNOSIS — R739 Hyperglycemia, unspecified: Secondary | ICD-10-CM

## 2020-02-12 DIAGNOSIS — I10 Essential (primary) hypertension: Secondary | ICD-10-CM

## 2020-02-12 NOTE — Progress Notes (Signed)
Subjective:    Patient ID: David [name], male    DOB: March 22, 1945, 75 y.o.   MRN: [id number]  No chief complaint on file.   HPI Patient is in today for follow upon chronic medical concerns. No recent fevrile illnesss or hospitalizations. No polydipsia but some urinary frequency and irritation are noted and some skin irritation in groin are also noted. No recent outings or other new concerns. Denies CP/palp/SOB/HA/congestion/fevers/GI or GU c/o. Taking meds as prescribed  Past Medical History:  Diagnosis Date  . Adenoma of right adrenal gland 07/11/2002   2.4cm , noted on CT ABD  . Allergy   . Anxiety   . Arthritis of both knees 03/26/2016  . Astigmatism   . Back pain   . BCC (basal cell carcinoma of skin)    under right eye and right ear  . Blood transfusion without reported diagnosis   . Cataract 09/29/2016  . Depression   . Diverticulitis 2009  . Diverticulosis   . Family history of breast cancer   . Family history of colon cancer   . Fatty liver   . GERD (gastroesophageal reflux disease)   . Gout   . Hearing loss of both ears 03/26/2016  . History of atrial fibrillation    x 2 none since 2020  . History of chronic prostatitis    started at age 36  . History of kidney stones   . Hyperglycemia   . Hyperlipidemia   . Hypertension   . Incomplete right bundle branch block (RBBB)   . Internal hemorrhoids   . Kidney lesion 06/07/2015   Right midportion, 1.1x1.1 cm hyperechoic, noted on [place] ABD  . LAFB (left anterior fascicular block)   . Left anterior fascicular block 08/06/2017   Noted on EKG  . Lipoma of axilla 09/2016  . Liver lesion, right lobe 06/07/2015   2.5x2.4x2.4 cm hypoechoic lesion posterior aspect, noted on [place] ABD  . Low back pain 03/26/2016  . LVH (left ventricular hypertrophy) 08/06/2017   Moderate, Noted on ECHO  . Medicare annual wellness visit, subsequent 03/12/2014  . Muscle cramps   . Nocturia   . OA (osteoarthritis)    Back, Hands, Neck  .  Obesity 11/25/2007   Qualifier: Diagnosis of  By: Lenna Gilford MD, Deborra Medina   . Other malaise and fatigue 03/13/2013  . Penis symptom or sign    PENIS sensitive under urethra and itches  . Premature ventricular complex   . Prostate cancer ([place]) dx 2021  . Renal insufficiency    Kidney removed right  . Scoliosis    Upper thoracic and lumbar  . Sinus bradycardia 08/06/2017   Noted on EKG  . Vitamin D deficiency    resolved    Past Surgical History:  Procedure Laterality Date  . CARDIOVERSION  07/31/2017  . CATARACT EXTRACTION, BILATERAL    . COLON SURGERY  2009   segmental sigmoid resection  . COLONOSCOPY    . CYSTOSCOPY N/A 07/28/2019   Procedure: CYSTOSCOPY FLEXIBLE;  Surgeon: Cleon Gustin, MD;  Location: [facility];  Service: Urology;  Laterality: N/A;  . CYSTOSCOPY W/ RETROGRADES Right 06/07/2018   Procedure: CYSTOSCOPY WITH RETROGRADE PYELOGRAM, uphrostogram;  Surgeon: Cleon Gustin, MD;  Location: [facility];  Service: Urology;  Laterality: Right;  . CYSTOSCOPY WITH URETEROSCOPY AND STENT PLACEMENT Right 02/28/2018   Procedure: CYSTOSCOPY WITH URETEROSCOPY [place];  Surgeon: Kathie Rhodes, MD;  Location: [facility];  Service: Urology;  Laterality: Right;  .  CYSTOSCOPY/URETEROSCOPY/HOLMIUM LASER/STENT PLACEMENT Right 11/29/2017   Procedure: CYSTOSCOPY/RETROGRADE/URETEROSCOPY/HOLMIUM LASER/STENT PLACEMENT;  Surgeon: Kathie Rhodes, MD;  Location: [facility];  Service: Urology;  Laterality: Right;  . EYE SURGERY Bilateral 01/12/2017   cataract removal  . history of blood tranfusion  age 89  . HOLMIUM LASER APPLICATION Right [id number]   Procedure: HOLMIUM LASER APPLICATION;  Surgeon: Kathie Rhodes, MD;  Location[facility]  Service: Urology;  Laterality: Right;  . IR BALLOON DILATION URETERAL STRICTURE RIGHT  03/14/2018  . IR NEPHRO TUBE REMOV/FL  03/17/2018  . IR NEPHROSTOGRAM RIGHT THRU EXISTING ACCESS  03/17/2018  . IR NEPHROSTOMY  EXCHANGE RIGHT  03/14/2018  . IR NEPHROSTOMY EXCHANGE RIGHT  06/21/2018  . IR NEPHROSTOMY PLACEMENT RIGHT  03/02/2018  . IR NEPHROSTOMY PLACEMENT RIGHT  04/20/2018  . IR URETERAL STENT PLACEMENT EXISTING ACCESS RIGHT  03/14/2018  . NOSE SURGERY     Submucous resection age 41  . NUCLEAR STRESS TEST  06/03/2009  . RADIOACTIVE SEED IMPLANT N/A 07/28/2019   Procedure: RADIOACTIVE SEED IMPLANT/BRACHYTHERAPY IMPLANT;  Surgeon: Cleon Gustin, MD;  Location: [facility];  Service: Urology;  Laterality: N/A;  90 MINS  . ROBOT ASSISTED LAPAROSCOPIC NEPHRECTOMY Right 08/04/2018   Procedure: XI ROBOTIC ASSISTED LAPAROSCOPIC NEPHRECTOMY;  Surgeon: Cleon Gustin, MD;  Location: [facility];  Service: Urology;  Laterality: Right;  3 HRS  . ROBOT ASSISTED PYELOPLASTY Right 06/07/2018   Procedure: attempted XI ROBOTIC ASSISTED PYELOPLASTY, lysis of adhesions;  Surgeon: Cleon Gustin, MD;  Location: [facility];  Service: Urology;  Laterality: Right;  3 HRS  . SKIN BIOPSY    . SPACE OAR INSTILLATION N/A 07/28/2019   Procedure: SPACE OAR INSTILLATION;  Surgeon: Cleon Gustin, MD;  Location: [facility];  Service: Urology;  Laterality: N/A;  . URETEROSCOPY WITH HOLMIUM LASER LITHOTRIPSY Right 09/24/2017   Procedure: CYSTOSCOPY, URETEROSCOPY WITH HOLMIUM LASER LITHOTRIPSY, STENT PLACEMENT;  Surgeon: Kathie Rhodes, MD;  Location: [facility];  Service: Urology;  Laterality: Right;    Family History  Problem Relation Age of Onset  . Heart disease Mother   . Hypertension Mother   . Stroke Mother   . Colon cancer Mother 46  . Breast cancer Mother 45  . Heart disease Father        pacemaker  . Aortic aneurysm Father   . Hypertension Father   . Lung cancer Father 102  . Heart disease Sister   . Atrial fibrillation Sister   . Obesity Sister   . Sleep apnea Sister   . Heart attack Brother   . Other Brother        muscle disease  . Arthritis Brother   . Stroke  Brother   . Atrial fibrillation Brother   . Heart attack Maternal Grandmother   . Diabetes Maternal Grandmother   . Cancer Maternal Grandfather 71       hodgin's lymphoma  . Heart attack Paternal Grandmother   . Anxiety disorder Paternal Grandmother   . Pneumonia Paternal Grandfather   . Heart attack Brother   . Atrial fibrillation Brother   . Atrial fibrillation Brother   . Heart attack Brother   . Hypertension Brother   . Hyperlipidemia Brother   . Heart attack Brother   . Other Brother        heart valve operation  . Atrial fibrillation Brother   . Lymphoma Niece 34  . Liver cancer Nephew 2       great  nephew; d. 7  . Esophageal cancer Neg Hx   . Rectal cancer Neg Hx   . Stomach cancer Neg Hx     Social History   Socioeconomic History  . Marital status: Married    Spouse name: Not on file  . Number of children: 3  . Years of education: Not on file  . Highest education level: Not on file  Occupational History  . 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 Smoking status: Former Smoker    Packs/day: 1.00    Years: 6.00    Pack years: 6.00    Types: Cigarettes    Start date: 01/12/1969    Quit date: 01/12/1969    Years since quitting: 51.1  . Smokeless tobacco: Never Used  . Tobacco comment: [id number]  Vaping Use  . Vaping Use: Never used  Substance and Sexual Activity  . Alcohol use: Yes    Comment: occ  . Drug use: Never  . Sexual activity: Yes  Other Topics Concern  . Not on file  Social History Narrative   The patient is married for the second time, he has 3 sons.   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   2 alcoholic beverages most days.   1 caffeinated beverage daily   No drug use no current tobacco use he is a prior smoker   12/24/2016   Social Determinants of Health   Financial Resource Strain: Not on file  Food Insecurity: Not on file  Transportation Needs: Not on file  Physical Activity: Not on file  Stress: Not on file  Social  Connections: Not on file  Intimate Partner Violence: Not on file    Outpatient Medications Prior to Visit  Medication Sig Dispense Refill  . ascorbic acid (VITAMIN C) 1000 MG tablet Take by mouth.    Marland Kitchen atenolol (TENORMIN) 25 MG tablet Take 1 tablet (25 mg total) by mouth 2 (two) times daily. 180 tablet 1  . atorvastatin (LIPITOR) 10 MG tablet Take 1 tablet (10 mg total) by mouth in the morning. 90 tablet 1  . azelastine (ASTELIN) 0.1 % nasal spray Place 2 sprays into both nostrils 2 (two) times daily. 30 mL 6  . clotrimazole-betamethasone (LOTRISONE) lotion Apply topically 2 (two) times daily. For 2 weeks. 30 mL 0  . Colchicine 0.6 MG CAPS 1 tab po bid 60 capsule 0  . fluconazole (DIFLUCAN) 150 MG tablet Take 1 tablet (150 mg total) by mouth once a week. 4 tablet 1  . FLUoxetine (PROZAC) 10 MG tablet Take 1 tablet (10 mg total) by mouth 2 (two) times daily. 180 tablet 1  . LORazepam (ATIVAN) 1 MG tablet TAKE ONE TABLET BY MOUTH TWICE A DAY AS NEEDED FOR ANXIETY OR SEDATION 180 tablet 1  . omeprazole (PRILOSEC) 20 MG capsule Take 20 mg by mouth daily as needed (acid reflux).     . tamsulosin (FLOMAX) 0.4 MG CAPS capsule Take 0.4 mg by mouth daily.    . traMADol (ULTRAM) 50 MG tablet Take 1 tablet (50 mg total) by mouth every 6 (six) hours as needed for moderate pain. 15 tablet 0  . apixaban (ELIQUIS) 5 MG TABS tablet Take 1 tablet (5 mg total) by mouth 2 (two) times daily. 180 tablet 3  . diltiazem (CARDIZEM CD) 180 MG 24 hr capsule Take 1 capsule (180 mg total) by mouth daily. (Patient not taking: Reported on 10/03/2019) 30 capsule 0   No facility-administered medications prior to  visit.    Allergies  Allergen Reactions  . Cefaclor Hives  . Cephalosporins Hives  . Penicillins Rash    Mild maculopapular rash Has patient had a PCN reaction causing immediate rash, facial/tongue/throat swelling, SOB or lightheadedness with hypotension: No Has patient had a PCN reaction causing severe rash  involving mucus membranes or skin necrosis: No Has patient had a PCN reaction that required hospitalization: No Has patient had a PCN reaction occurring within the last 10 years: No If all of the above answers are "NO", then may proceed with Cephalosporin use.     Review of Systems  Constitutional: Positive for malaise/fatigue. Negative for fever.  HENT: Negative for congestion.   Eyes: Negative for blurred vision.  Respiratory: Negative for shortness of breath.   Cardiovascular: Negative for chest pain, palpitations and leg swelling.  Gastrointestinal: Negative for abdominal pain, blood in stool and nausea.  Genitourinary: Positive for frequency. Negative for dysuria.  Musculoskeletal: Negative for falls.  Skin: Negative for rash.  Neurological: Negative for dizziness, loss of consciousness and headaches.  Endo/Heme/Allergies: Negative for environmental allergies.  Psychiatric/Behavioral: Negative for depression. The patient is not nervous/anxious.        Objective:    Physical Exam Vitals and nursing note reviewed.  Constitutional:      General: He is not in acute distress.    Appearance: He is well-developed and well-nourished.  HENT:     Head: Normocephalic and atraumatic.     Nose: Nose normal.  Eyes:     General:        Right eye: No discharge.        Left eye: No discharge.  Cardiovascular:     Rate and Rhythm: Normal rate and regular rhythm.     Heart sounds: No murmur heard.   Pulmonary:     Effort: Pulmonary effort is normal.     Breath sounds: Normal breath sounds.  Abdominal:     General: Bowel sounds are normal.     Palpations: Abdomen is soft.     Tenderness: There is no abdominal tenderness.  Musculoskeletal:        General: No edema.     Cervical back: Normal range of motion and neck supple.  Skin:    General: Skin is warm and dry.  Neurological:     Mental Status: He is alert and oriented to person, place, and time.  Psychiatric:        Mood  and Affect: Mood and affect normal.     BP 132/74   Pulse 60   Temp 97.8 F (36.6 C)   Resp 16   Wt 253 lb 9.6 oz (115 kg)   SpO2 95%   BMI 34.39 kg/m  Wt Readings from Last 3 Encounters:  02/12/20 253 lb 9.6 oz (115 kg)  10/12/19 241 lb 6.4 oz (109.5 kg)  10/03/19 235 lb (106.6 kg)    Diabetic Foot Exam - Simple   No data filed    Lab Results  Component Value Date   WBC 6.1 02/05/2020   HGB 13.9 02/05/2020   HCT 42.6 02/05/2020   PLT 187.0 02/05/2020   GLUCOSE 90 02/05/2020   CHOL 225 (H) 07/21/2019   TRIG 159.0 (H) 07/21/2019   HDL 38.20 (L) 07/21/2019   LDLDIRECT 168.5 03/29/2006   LDLCALC 155 (H) 07/21/2019   ALT 18 02/05/2020   AST 15 02/05/2020   NA 138 02/05/2020   K 4.2 02/05/2020   CL 104 02/05/2020   CREATININE  1.86 (H) 02/05/2020   BUN 19 02/05/2020   CO2 29 02/05/2020   TSH 3.059 08/20/2019   PSA 5.53 (H) 03/13/2019   INR 1.0 07/25/2019   HGBA1C 5.6 02/05/2020    Lab Results  Component Value Date   TSH 3.059 08/20/2019   Lab Results  Component Value Date   WBC 6.1 02/05/2020   HGB 13.9 02/05/2020   HCT 42.6 02/05/2020   MCV 93.6 02/05/2020   PLT 187.0 02/05/2020   Lab Results  Component Value Date   NA 138 02/05/2020   K 4.2 02/05/2020   CO2 29 02/05/2020   GLUCOSE 90 02/05/2020   BUN 19 02/05/2020   CREATININE 1.86 (H) 02/05/2020   BILITOT 0.5 02/05/2020   ALKPHOS 81 02/05/2020   AST 15 02/05/2020   ALT 18 02/05/2020   PROT 6.6 02/05/2020   ALBUMIN 4.2 02/05/2020   CALCIUM 10.1 02/05/2020   ANIONGAP 10 08/20/2019   GFR 35.15 (L) 02/05/2020   Lab Results  Component Value Date   CHOL 225 (H) 07/21/2019   Lab Results  Component Value Date   HDL 38.20 (L) 07/21/2019   Lab Results  Component Value Date   LDLCALC 155 (H) 07/21/2019   Lab Results  Component Value Date   TRIG 159.0 (H) 07/21/2019   Lab Results  Component Value Date   CHOLHDL 6 07/21/2019   Lab Results  Component Value Date   HGBA1C 5.6  02/05/2020       Assessment & Plan:   Problem List Items Addressed This Visit    Hyperlipidemia, mixed   Relevant Orders   Lipid panel   Essential hypertension, benign    Well controlled, no changes to meds. Encouraged heart healthy diet such as the DASH diet and exercise as tolerated.       Relevant Orders   CBC   Comprehensive metabolic panel   TSH   Gout    Uric acid up but no flare, encouraged to avoid offending foods and increase hydration he declines allopurinol rx for now.       Renal insufficiency    With a singular kidney and worsening numbers referred to nephrology for further evaluation.      Relevant Orders   Comprehensive metabolic panel   Ambulatory referral to Nephrology   Gouty arthritis of toe of left foot   Relevant Orders   Uric acid   Depression with anxiety    He is struggling some but does not feel he needs meds changed yet.       Hyperglycemia    hgba1c acceptable, minimize simple carbs. Increase exercise as tolerated.       Relevant Orders   Hemoglobin A1c   Abnormal TSH   Relevant Orders   TSH   Malignant neoplasm of prostate ([place])    He tolerated his course of treatment well this past year and has largely recovered. Follows with urology still although he did have to change urologist when his left the practice      Urinary frequency - Primary    With some irritation. Had UA done at urology yesterday will check for yeast and BV today      Relevant Orders   Urine cytology ancillary only([place])      I have discontinued David [name] "Fred"'s diltiazem. I am also having him maintain his omeprazole, Colchicine, ascorbic acid, atorvastatin, traMADol, tamsulosin, apixaban, azelastine, atenolol, LORazepam, clotrimazole-betamethasone, fluconazole, and FLUoxetine.  No orders of the defined types were placed in  this encounter.    Penni Homans, MD

## 2020-02-12 NOTE — Assessment & Plan Note (Signed)
He tolerated his course of treatment well this past year and has largely recovered. Follows with urology still although he did have to change urologist when his left the practice

## 2020-02-12 NOTE — Assessment & Plan Note (Signed)
With some irritation. Had UA done at urology yesterday will check for yeast and BV today

## 2020-02-12 NOTE — Assessment & Plan Note (Signed)
Well controlled, no changes to meds. Encouraged heart healthy diet such as the DASH diet and exercise as tolerated.  °

## 2020-02-12 NOTE — Assessment & Plan Note (Signed)
hgba1c acceptable, minimize simple carbs. Increase exercise as tolerated.  

## 2020-02-12 NOTE — Patient Instructions (Signed)
Avoid purines and nitrates   Gout  Gout is a condition that causes painful swelling of the joints. Gout is a type of inflammation of the joints (arthritis). This condition is caused by having too much uric acid in the body. Uric acid is a chemical that forms when the body breaks down substances called purines. Purines are important for building body proteins. When the body has too much uric acid, sharp crystals can form and build up inside the joints. This causes pain and swelling. Gout attacks can happen quickly and may be very painful (acute gout). Over time, the attacks can affect more joints and become more frequent (chronic gout). Gout can also cause uric acid to build up under the skin and inside the kidneys. What are the causes? This condition is caused by too much uric acid in your blood. This can happen because:  Your kidneys do not remove enough uric acid from your blood. This is the most common cause.  Your body makes too much uric acid. This can happen with some cancers and cancer treatments. It can also occur if your body is breaking down too many red blood cells (hemolytic anemia).  You eat too many foods that are high in purines. These foods include organ meats and some seafood. Alcohol, especially beer, is also high in purines. A gout attack may be triggered by trauma or stress. What increases the risk? You are more likely to develop this condition if you:  Have a family history of gout.  Are male and middle-aged.  Are male and have gone through menopause.  Are obese.  Frequently drink alcohol, especially beer.  Are dehydrated.  Lose weight too quickly.  Have an organ transplant.  Have lead poisoning.  Take certain medicines, including aspirin, cyclosporine, diuretics, levodopa, and niacin.  Have kidney disease.  Have a skin condition called psoriasis. What are the signs or symptoms? An attack of acute gout happens quickly. It usually occurs in just one  joint. The most common place is the big toe. Attacks often start at night. Other joints that may be affected include joints of the feet, ankle, knee, fingers, wrist, or elbow. Symptoms of this condition may include:  Severe pain.  Warmth.  Swelling.  Stiffness.  Tenderness. The affected joint may be very painful to touch.  Shiny, red, or purple skin.  Chills and fever. Chronic gout may cause symptoms more frequently. More joints may be involved. You may also have white or yellow lumps (tophi) on your hands or feet or in other areas near your joints.   How is this diagnosed? This condition is diagnosed based on your symptoms, medical history, and physical exam. You may have tests, such as:  Blood tests to measure uric acid levels.  Removal of joint fluid with a thin needle (aspiration) to look for uric acid crystals.  X-rays to look for joint damage. How is this treated? Treatment for this condition has two phases: treating an acute attack and preventing future attacks. Acute gout treatment may include medicines to reduce pain and swelling, including:  NSAIDs.  Steroids. These are strong anti-inflammatory medicines that can be taken by mouth (orally) or injected into a joint.  Colchicine. This medicine relieves pain and swelling when it is taken soon after an attack. It can be given by mouth or through an IV. Preventive treatment may include:  Daily use of smaller doses of NSAIDs or colchicine.  Use of a medicine that reduces uric acid levels in your  blood.  Changes to your diet. You may need to see a dietitian about what to eat and drink to prevent gout. Follow these instructions at home: During a gout attack  If directed, put ice on the affected area: ? Put ice in a plastic bag. ? Place a towel between your skin and the bag. ? Leave the ice on for 20 minutes, 2-3 times a day.  Raise (elevate) the affected joint above the level of your heart as often as  possible.  Rest the joint as much as possible. If the affected joint is in your leg, you may be given crutches to use.  Follow instructions from your health care provider about eating or drinking restrictions.   Avoiding future gout attacks  Follow a low-purine diet as told by your dietitian or health care provider. Avoid foods and drinks that are high in purines, including liver, kidney, anchovies, asparagus, herring, mushrooms, mussels, and beer.  Maintain a healthy weight or lose weight if you are overweight. If you want to lose weight, talk with your health care provider. It is important that you do not lose weight too quickly.  Start or maintain an exercise program as told by your health care provider. Eating and drinking  Drink enough fluids to keep your urine pale yellow.  If you drink alcohol: ? Limit how much you use to:  0-1 drink a day for women.  0-2 drinks a day for men. ? Be aware of how much alcohol is in your drink. In the [place] one drink equals one 12 oz bottle of beer (355 mL) one 5 oz glass of wine (148 mL), or one 1 oz glass of hard liquor (44 mL). General instructions  Take over-the-counter and prescription medicines only as told by your health care provider.  Do not drive or use heavy machinery while taking prescription pain medicine.  Return to your normal activities as told by your health care provider. Ask your health care provider what activities are safe for you.  Keep all follow-up visits as told by your health care provider. This is important. Contact a health care provider if you have:  Another gout attack.  Continuing symptoms of a gout attack after 10 days of treatment.  Side effects from your medicines.  Chills or a fever.  Burning pain when you urinate.  Pain in your lower back or belly. Get help right away if you:  Have severe or uncontrolled pain.  Cannot urinate. Summary  Gout is painful swelling of the joints caused by  inflammation.  The most common site of pain is the big toe, but it can affect other joints in the body.  Medicines and dietary changes can help to prevent and treat gout attacks. This information is not intended to replace advice given to you by your health care provider. Make sure you discuss any questions you have with your health care provider. Document Revised: 07/21/2017 Document Reviewed: 07/21/2017 Elsevier Patient Education  [place].

## 2020-02-12 NOTE — Assessment & Plan Note (Signed)
With a singular kidney and worsening numbers referred to nephrology for further evaluation.

## 2020-02-12 NOTE — Assessment & Plan Note (Signed)
He is struggling some but does not feel he needs meds changed yet.

## 2020-02-12 NOTE — Assessment & Plan Note (Signed)
Uric acid up but no flare, encouraged to avoid offending foods and increase hydration he declines allopurinol rx for now.

## 2020-02-13 LAB — URINE CYTOLOGY ANCILLARY ONLY
Bacterial Vaginitis-Urine: NEGATIVE
Candida Urine: NEGATIVE

## 2020-02-14 NOTE — Progress Notes (Signed)
Subjective:   David [name] is a 75 y.o. male who presents for Medicare Annual/Subsequent preventive examination.  Review of Systems     Cardiac Risk Factors include: advanced age (>31men, >58 women);hypertension;dyslipidemia;obesity (BMI >30kg/m2)     Objective:    Today's Vitals   02/15/20 0857 02/15/20 0858  BP: 132/80   Pulse: 60   Resp: 16   Temp: 97.8 F (36.6 C)   TempSrc: Oral   SpO2: 96%   Weight: 255 lb (115.7 kg)   Height: 6' (1.829 m)   PainSc:  7    Body mass index is 34.58 kg/m.  Advanced Directives 02/15/2020 08/20/2019 08/17/2019 07/28/2019 06/01/2019 04/28/2019 02/10/2019  Does Patient Have a Medical Advance Directive? No No No No No No No  Would patient like information on creating a medical advance directive? Yes (MAU/Ambulatory/Procedural Areas - Information given) Yes (ED - Information included in AVS) No - Patient declined Yes (MAU/Ambulatory/Procedural Areas - Information given) - Yes (MAU/Ambulatory/Procedural Areas - Information given) No - Patient declined    Current Medications (verified) Outpatient Encounter Medications as of 02/15/2020  Medication Sig  . ascorbic acid (VITAMIN C) 1000 MG tablet Take by mouth.  Marland Kitchen atenolol (TENORMIN) 25 MG tablet Take 1 tablet (25 mg total) by mouth 2 (two) times daily.  Marland Kitchen atorvastatin (LIPITOR) 10 MG tablet Take 1 tablet (10 mg total) by mouth in the morning.  Marland Kitchen azelastine (ASTELIN) 0.1 % nasal spray Place 2 sprays into both nostrils 2 (two) times daily.  . clotrimazole-betamethasone (LOTRISONE) lotion Apply topically 2 (two) times daily. For 2 weeks.  . Colchicine 0.6 MG CAPS 1 tab po bid  . fluconazole (DIFLUCAN) 150 MG tablet Take 1 tablet (150 mg total) by mouth once a week.  Marland Kitchen FLUoxetine (PROZAC) 10 MG tablet Take 1 tablet (10 mg total) by mouth 2 (two) times daily.  Marland Kitchen LORazepam (ATIVAN) 1 MG tablet TAKE ONE TABLET BY MOUTH TWICE A DAY AS NEEDED FOR ANXIETY OR SEDATION  . omeprazole (PRILOSEC) 20 MG capsule Take  20 mg by mouth daily as needed (acid reflux).   . tamsulosin (FLOMAX) 0.4 MG CAPS capsule Take 0.4 mg by mouth daily.  . traMADol (ULTRAM) 50 MG tablet Take 1 tablet (50 mg total) by mouth every 6 (six) hours as needed for moderate pain.  Marland Kitchen apixaban (ELIQUIS) 5 MG TABS tablet Take 1 tablet (5 mg total) by mouth 2 (two) times daily.   No facility-administered encounter medications on file as of 02/15/2020.    Allergies (verified) Cefaclor, Cephalosporins, and Penicillins   History: Past Medical History:  Diagnosis Date  . Adenoma of right adrenal gland 07/11/2002   2.4cm , noted on CT ABD  . Allergy   . Anxiety   . Arthritis of both knees 03/26/2016  . Astigmatism   . Back pain   . BCC (basal cell carcinoma of skin)    under right eye and right ear  . Blood transfusion without reported diagnosis   . Cataract 09/29/2016  . Depression   . Diverticulitis 2009  . Diverticulosis   . Family history of breast cancer   . Family history of colon cancer   . Fatty liver   . GERD (gastroesophageal reflux disease)   . Gout   . Hearing loss of both ears 03/26/2016  . History of atrial fibrillation    x 2 none since 2020  . History of chronic prostatitis    started at age 73  . History of kidney  stones   . Hyperglycemia   . Hyperlipidemia   . Hypertension   . Incomplete right bundle branch block (RBBB)   . Internal hemorrhoids   . Kidney lesion 06/07/2015   Right midportion, 1.1x1.1 cm hyperechoic, noted on [place] ABD  . LAFB (left anterior fascicular block)   . Left anterior fascicular block 08/06/2017   Noted on EKG  . Lipoma of axilla 09/2016  . Liver lesion, right lobe 06/07/2015   2.5x2.4x2.4 cm hypoechoic lesion posterior aspect, noted on [place] ABD  . Low back pain 03/26/2016  . LVH (left ventricular hypertrophy) 08/06/2017   Moderate, Noted on ECHO  . Medicare annual wellness visit, subsequent 03/12/2014  . Muscle cramps   . Nocturia   . OA (osteoarthritis)    Back, Hands, Neck   . Obesity 11/25/2007   Qualifier: Diagnosis of  By: Lenna Gilford MD, Deborra Medina   . Other malaise and fatigue 03/13/2013  . Penis symptom or sign    PENIS sensitive under urethra and itches  . Premature ventricular complex   . Prostate cancer ([place]) dx 2021  . Renal insufficiency    Kidney removed right  . Scoliosis    Upper thoracic and lumbar  . Sinus bradycardia 08/06/2017   Noted on EKG  . Vitamin D deficiency    resolved   Past Surgical History:  Procedure Laterality Date  . CARDIOVERSION  07/31/2017  . CATARACT EXTRACTION, BILATERAL    . COLON SURGERY  2009   segmental sigmoid resection  . COLONOSCOPY    . CYSTOSCOPY N/A 07/28/2019   Procedure: CYSTOSCOPY FLEXIBLE;  Surgeon: Cleon Gustin, MD;  Location: [facility];  Service: Urology;  Laterality: N/A;  . CYSTOSCOPY W/ RETROGRADES Right 06/07/2018   Procedure: CYSTOSCOPY WITH RETROGRADE PYELOGRAM, uphrostogram;  Surgeon: Cleon Gustin, MD;  Location: [facility];  Service: Urology;  Laterality: Right;  . CYSTOSCOPY WITH URETEROSCOPY AND STENT PLACEMENT Right 02/28/2018   Procedure: CYSTOSCOPY WITH URETEROSCOPY Concha Se;  Surgeon: Kathie Rhodes, MD;  Location: [facility];  Service: Urology;  Laterality: Right;  . CYSTOSCOPY/URETEROSCOPY/HOLMIUM LASER/STENT PLACEMENT Right 11/29/2017   Procedure: CYSTOSCOPY/RETROGRADE/URETEROSCOPY/HOLMIUM LASER/STENT PLACEMENT;  Surgeon: Kathie Rhodes, MD;  Location: [facility];  Service: Urology;  Laterality: Right;  . EYE SURGERY Bilateral 01/12/2017   cataract removal  . history of blood tranfusion  age 24  . HOLMIUM LASER APPLICATION Right [id number]   Procedure: HOLMIUM LASER APPLICATION;  Surgeon: Kathie Rhodes, MD;  Location: [facility];  Service: Urology;  Laterality: Right;  . IR BALLOON DILATION URETERAL STRICTURE RIGHT  03/14/2018  . IR NEPHRO TUBE REMOV/FL  03/17/2018  . IR NEPHROSTOGRAM RIGHT THRU EXISTING ACCESS  03/17/2018  . IR NEPHROSTOMY  EXCHANGE RIGHT  03/14/2018  . IR NEPHROSTOMY EXCHANGE RIGHT  06/21/2018  . IR NEPHROSTOMY PLACEMENT RIGHT  03/02/2018  . IR NEPHROSTOMY PLACEMENT RIGHT  04/20/2018  . IR URETERAL STENT PLACEMENT EXISTING ACCESS RIGHT  03/14/2018  . NOSE SURGERY     Submucous resection age 88  . NUCLEAR STRESS TEST  06/03/2009  . RADIOACTIVE SEED IMPLANT N/A 07/28/2019   Procedure: RADIOACTIVE SEED IMPLANT/BRACHYTHERAPY IMPLANT;  Surgeon: Cleon Gustin, MD;  Location: [facility];  Service: Urology;  Laterality: N/A;  90 MINS  . ROBOT ASSISTED LAPAROSCOPIC NEPHRECTOMY Right 08/04/2018   Procedure: XI ROBOTIC ASSISTED LAPAROSCOPIC NEPHRECTOMY;  Surgeon: Cleon Gustin, MD;  Location: [facility];  Service: Urology;  Laterality: Right;  3 HRS  . ROBOT ASSISTED PYELOPLASTY Right  06/07/2018   Procedure: attempted XI ROBOTIC ASSISTED PYELOPLASTY, lysis of adhesions;  Surgeon: Cleon Gustin, MD;  Location: [facility];  Service: Urology;  Laterality: Right;  3 HRS  . SKIN BIOPSY    . SPACE OAR INSTILLATION N/A 07/28/2019   Procedure: SPACE OAR INSTILLATION;  Surgeon: Cleon Gustin, MD;  Location[facility];  Service: Urology;  Laterality: N/A;  . URETEROSCOPY WITH HOLMIUM LASER LITHOTRIPSY Right 09/24/2017   Procedure: CYSTOSCOPY, URETEROSCOPY WITH HOLMIUM LASER LITHOTRIPSY, STENT PLACEMENT;  Surgeon: Kathie Rhodes, MD;  Location: [facility];  Service: Urology;  Laterality: Right;   Family History  Problem Relation Age of Onset  . Heart disease Mother   . Hypertension Mother   . Stroke Mother   . Colon cancer Mother 74  . Breast cancer Mother 57  . Heart disease Father        pacemaker  . Aortic aneurysm Father   . Hypertension Father   . Lung cancer Father 44  . Heart disease Sister   . Atrial fibrillation Sister   . Obesity Sister   . Sleep apnea Sister   . Heart attack Brother   . Other Brother        muscle disease  . Arthritis Brother   . Stroke  Brother   . Atrial fibrillation Brother   . Heart attack Maternal Grandmother   . Diabetes Maternal Grandmother   . Cancer Maternal Grandfather 46       hodgin's lymphoma  . Heart attack Paternal Grandmother   . Anxiety disorder Paternal Grandmother   . Pneumonia Paternal Grandfather   . Heart attack Brother   . Atrial fibrillation Brother   . Atrial fibrillation Brother   . Heart attack Brother   . Hypertension Brother   . Hyperlipidemia Brother   . Heart attack Brother   . Other Brother        heart valve operation  . Atrial fibrillation Brother   . Lymphoma Niece 79  . Liver cancer Nephew 2       great nephew; d. 75  . Esophageal cancer Neg Hx   . Rectal cancer Neg Hx   . Stomach cancer Neg Hx    Social History   Socioeconomic History  . Marital status: Married    Spouse name: Not on file  . Number of children: 3  . Years of education: Not on file  . Highest education level: Not on file  Occupational History  . 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 Smoking status: Former Smoker    Packs/day: 1.00    Years: 6.00    Pack years: 6.00    Types: Cigarettes    Start date: 01/12/1969    Quit date: 01/12/1969    Years since quitting: 51.1  . Smokeless tobacco: Never Used  . Tobacco comment: [id number]  Vaping Use  . Vaping Use: Never used  Substance and Sexual Activity  . Alcohol use: Yes    Comment: occ  . Drug use: Never  . Sexual activity: Yes  Other Topics Concern  . Not on file  Social History Narrative   The patient is married for the second time, he has 3 sons.   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   2 alcoholic beverages most days.   1 caffeinated beverage daily   No drug use no current tobacco use he is a prior smoker   12/24/2016   Social Determinants  of Health   Financial Resource Strain: Low Risk   . Difficulty of Paying Living Expenses: Not very hard  Food Insecurity: No Food Insecurity  . Worried About Charity fundraiser in  the Last Year: Never true  . Ran Out of Food in the Last Year: Never true  Transportation Needs: No Transportation Needs  . Lack of Transportation (Medical): No  . Lack of Transportation (Non-Medical): No  Physical Activity: Insufficiently Active  . Days of Exercise per Week: 3 days  . Minutes of Exercise per Session: 30 min  Stress: No Stress Concern Present  . Feeling of Stress : Only a little  Social Connections: Moderately Isolated  . Frequency of Communication with Friends and Family: More than three times a week  . Frequency of Social Gatherings with Friends and Family: Once a week  . Attends Religious Services: Never  . Active Member of Clubs or Organizations: No  . Attends Archivist Meetings: Never  . Marital Status: Married    Tobacco Counseling Counseling given: Not Answered Comment: [phone]   Clinical Intake:  Pre-visit preparation completed: Yes  Pain : 0-10 Pain Score: 7  Pain Type: Chronic pain Pain Location: Hip (patient states chronic pain in hip, tailbone, urinary tract, shoulders,feet & back) Pain Onset: More than a month ago Pain Frequency: Intermittent     Nutritional Status: BMI > 30  Obese Nutritional Risks: None Diabetes: No  How often do you need to have someone help you when you read instructions, pamphlets, or other written materials from your doctor or pharmacy?: 1 - Never  Diabetic?No  Interpreter Needed?: No  Information entered by :: Caroleen Hamman   Activities of Daily Living In your present state of health, do you have any difficulty performing the following activities: 02/15/2020 02/12/2020  Hearing? N Y  Comment - wears hearing aids  Vision? N N  Difficulty concentrating or making decisions? Y N  Comment sometimes forgets words -  Walking or climbing stairs? N N  Dressing or bathing? N N  Doing errands, shopping? N N  Preparing Food and eating ? N -  Using the Toilet? N -  In the past six months, have you  accidently leaked urine? Y -  Comment sees urologist -  Do you have problems with loss of bowel control? N -  Managing your Medications? N -  Managing your Finances? N -  Housekeeping or managing your Housekeeping? N -  Some recent data might be hidden    Patient Care Team: Mosie Lukes, MD as PCP - General (Family Medicine) Minus Breeding, MD as PCP - Cardiology (Cardiology) Linward Natal, MD as Consulting Physician (Ophthalmology) Danella Sensing, MD as Consulting Physician (Dermatology) Cira Rue, RN Nurse Navigator as Registered Nurse (Medical Oncology)  Indicate any recent Medical Services you may have received from other than [facility] providers in the past year (date may be approximate).     Assessment:   This is a routine wellness examination for David [name].  Hearing/Vision screen  Hearing Screening   125Hz  250Hz  500Hz  1000Hz  2000Hz  3000Hz  4000Hz  6000Hz  8000Hz   Right ear:           Left ear:           Comments: Wears hearing aids  Vision Screening Comments: Wears glasses Last eye exam-2020-Dr. Themis  Dietary issues and exercise activities discussed: Current Exercise Habits: Home exercise routine, Type of exercise: walking, Time (Minutes): 25, Frequency (Times/Week): 3, Weekly Exercise (Minutes/Week): 75, Intensity: Mild,  Exercise limited by: None identified  Goals    . DIET - INCREASE WATER INTAKE    . Increase physical activity      Depression Screen PHQ 2/9 Scores 02/15/2020 02/12/2020 10/12/2019 02/10/2019 02/03/2018 10/08/2017 03/30/2017  PHQ - 2 Score 3 6 3  0 0 0 0  PHQ- 9 Score 5 12 10  - - - -  Exception Documentation - - - - - - -    Fall Risk Fall Risk  02/15/2020 02/12/2020 02/10/2019 02/03/2018 10/08/2017  Falls in the past year? 1 0 0 0 No  Number falls in past yr: 0 0 0 - -  Injury with Fall? 0 0 0 - -  Comment - - - - -  Risk for fall due to : - - - - -  Follow up Falls prevention discussed - Education provided;Falls prevention discussed - -    FALL  RISK PREVENTION PERTAINING TO THE HOME:  Any stairs in or around the home? Yes  If so, are there any without handrails? No  Home free of loose throw rugs in walkways, pet beds, electrical cords, etc? Yes  Adequate lighting in your home to reduce risk of falls? Yes   ASSISTIVE DEVICES UTILIZED TO PREVENT FALLS:  Life alert? No  Use of a cane, walker or w/c? No  Grab bars in the bathroom? No  Shower chair or bench in shower? No  Elevated toilet seat or a handicapped toilet? No   TIMED UP AND GO:  Was the test performed? Yes .  Length of time to ambulate 10 feet: 10 sec.   Gait slow and steady without use of assistive device  Cognitive Function:Normal cognitive status assessed by direct observation by this Nurse Health Advisor. No abnormalities found.       6CIT Screen 02/15/2020  What Year? 0 points  What month? 0 points  What time? 0 points  Count back from 20 0 points  Months in reverse 0 points  Repeat phrase 0 points  Total Score 0    Immunizations Immunization History  Administered Date(s) Administered  . Fluad Quad(high Dose 65+) 10/12/2019  . Influenza Split 10/02/2015  . Influenza, High Dose Seasonal PF 09/29/2016, 11/15/2017, 09/07/2018  . Influenza,inj,Quad PF,6+ Mos 09/14/2012, 09/25/2013, 03/07/2015  . PFIZER(Purple Top)SARS-COV-2 Vaccination 03/10/2019, 03/28/2019, 01/02/2020  . Pneumococcal Conjugate-13 09/05/2012  . Pneumococcal Polysaccharide-23 08/30/2014  . Tdap 09/05/2012    TDAP status: Up to date  Flu Vaccine status: Up to date  Pneumococcal vaccine status: Up to date  Covid-19 vaccine status: Completed vaccines  Qualifies for Shingles Vaccine? Yes   Zostavax completed No   Shingrix Completed?: No.    Education has been provided regarding the importance of this vaccine. Patient has been advised to call insurance company to determine out of pocket expense if they have not yet received this vaccine. Advised may also receive vaccine at local  pharmacy or Health Dept. Verbalized acceptance and understanding.  Screening Tests Health Maintenance  Topic Date Due  . COLONOSCOPY (Pts 45-78yrs Insurance coverage will need to be confirmed)  09/08/2017  . COVID-19 Vaccine (4 - Booster for Pfizer series) 07/02/2020  . TETANUS/TDAP  09/06/2022  . INFLUENZA VACCINE  Completed  . Hepatitis C Screening  Completed  . PNA vac Low Risk Adult  Completed    Health Maintenance  Health Maintenance Due  Topic Date Due  . COLONOSCOPY (Pts 45-25yrs Insurance coverage will need to be confirmed)  09/08/2017    Colorectal cancer screening: Due-Postponed last  year due to more pressing health concerns. Patient to call GI to reschedule.  Lung Cancer Screening: (Low Dose CT Chest recommended if Age 102-80 years, 30 pack-year currently smoking OR have quit w/in 15years.) does not qualify.     Additional Screening:  Hepatitis C Screening: Completed 05/04/2014  Vision Screening: Recommended annual ophthalmology exams for early detection of glaucoma and other disorders of the eye. Is the patient up to date with their annual eye exam?  No  Who is the provider or what is the name of the office in which the patient attends annual eye exams? Dr. Dominga Ferry Patient to make an appt   Dental Screening: Recommended annual dental exams for proper oral hygiene  Community Resource Referral / Chronic Care Management: CRR required this visit?  No   CCM required this visit?  No      Plan:     I have personally reviewed and noted the following in the patient's chart:   . Medical and social history . Use of alcohol, tobacco or illicit drugs  . Current medications and supplements . Functional ability and status . Nutritional status . Physical activity . Advanced directives . List of other physicians . Hospitalizations, surgeries, and ER visits in previous 12 months . Vitals . Screenings to include cognitive, depression, and falls . Referrals and  appointments  In addition, I have reviewed and discussed with patient certain preventive protocols, quality metrics, and best practice recommendations. A written personalized care plan for preventive services as well as general preventive health recommendations were provided to patient.     Marta Antu, LPN   08/16/1322  Nurse Health Advisor  Nurse Notes: Patient has a depression score of 5 today. Denies suicidal thoughts.  He is currently taking Prozac. He declined an appt with PCP at this time but he agrees to call the office to make an appt if symptoms worsen.

## 2020-02-15 ENCOUNTER — Ambulatory Visit (INDEPENDENT_AMBULATORY_CARE_PROVIDER_SITE_OTHER): Payer: Medicare Other

## 2020-02-15 ENCOUNTER — Other Ambulatory Visit: Payer: Self-pay

## 2020-02-15 ENCOUNTER — Ambulatory Visit: Payer: Self-pay | Admitting: *Deleted

## 2020-02-15 VITALS — BP 132/80 | HR 60 | Temp 97.8°F | Resp 16 | Ht 72.0 in | Wt 255.0 lb

## 2020-02-15 DIAGNOSIS — Z0000 Encounter for general adult medical examination without abnormal findings: Secondary | ICD-10-CM

## 2020-02-15 NOTE — Patient Instructions (Signed)
David [name] , Thank you for taking time to come for your Medicare Wellness Visit. I appreciate your ongoing commitment to your health goals. Please review the following plan we discussed and let me know if I can assist you in the future.   Screening recommendations/referrals: Colonoscopy: Due-Postponed in the past due to other more pressing health conditions. Per our conversation, you will contact GI to schedule. Recommended yearly ophthalmology/optometry visit for glaucoma screening and checkup Recommended yearly dental visit for hygiene and checkup  Vaccinations: Influenza vaccine: Up to date Pneumococcal vaccine: Completed vaccines Tdap vaccine: Up to date-Due-09/06/2022 Shingles vaccine: Per our conversation. Completed but unsure of date. Covid-19: Completed vaccines  Advanced directives: Information given today.  Conditions/risks identified: See problem list  Next appointment: Follow up in one year for your annual wellness visit. 02/20/2021 @ 10:20.  Preventive Care 75 Years and Older, Male Preventive care refers to lifestyle choices and visits with your health care provider that can promote health and wellness. What does preventive care include?  A yearly physical exam. This is also called an annual well check.  Dental exams once or twice a year.  Routine eye exams. Ask your health care provider how often you should have your eyes checked.  Personal lifestyle choices, including:  Daily care of your teeth and gums.  Regular physical activity.  Eating a healthy diet.  Avoiding tobacco and drug use.  Limiting alcohol use.  Practicing safe sex.  Taking low doses of aspirin every day.  Taking vitamin and mineral supplements as recommended by your health care provider. What happens during an annual well check? The services and screenings done by your health care provider during your annual well check will depend on your age, overall health, lifestyle risk factors, and  family history of disease. Counseling  Your health care provider may ask you questions about your:  Alcohol use.  Tobacco use.  Drug use.  Emotional well-being.  Home and relationship well-being.  Sexual activity.  Eating habits.  History of falls.  Memory and ability to understand (cognition).  Work and work Statistician. Screening  You may have the following tests or measurements:  Height, weight, and BMI.  Blood pressure.  Lipid and cholesterol levels. These may be checked every 5 years, or more frequently if you are over 17 years old.  Skin check.  Lung cancer screening. You may have this screening every year starting at age 26 if you have a 30-pack-year history of smoking and currently smoke or have quit within the past 15 years.  Fecal occult blood test (FOBT) of the stool. You may have this test every year starting at age 4.  Flexible sigmoidoscopy or colonoscopy. You may have a sigmoidoscopy every 5 years or a colonoscopy every 10 years starting at age 49.  Prostate cancer screening. Recommendations will vary depending on your family history and other risks.  Hepatitis C blood test.  Hepatitis B blood test.  Sexually transmitted disease (STD) testing.  Diabetes screening. This is done by checking your blood sugar (glucose) after you have not eaten for a while (fasting). You may have this done every 1-3 years.  Abdominal aortic aneurysm (AAA) screening. You may need this if you are a current or former smoker.  Osteoporosis. You may be screened starting at age 73 if you are at high risk. Talk with your health care provider about your test results, treatment options, and if necessary, the need for more tests. Vaccines  Your health care provider may recommend  certain vaccines, such as:  Influenza vaccine. This is recommended every year.  Tetanus, diphtheria, and acellular pertussis (Tdap, Td) vaccine. You may need a Td booster every 10 years.  Zoster  vaccine. You may need this after age 55.  Pneumococcal 13-valent conjugate (PCV13) vaccine. One dose is recommended after age 45.  Pneumococcal polysaccharide (PPSV23) vaccine. One dose is recommended after age 29. Talk to your health care provider about which screenings and vaccines you need and how often you need them. This information is not intended to replace advice given to you by your health care provider. Make sure you discuss any questions you have with your health care provider. Document Released: 01/25/2015 Document Revised: 09/18/2015 Document Reviewed: 10/30/2014 Elsevier Interactive Patient Education  2017 [employer] Prevention in the Home Falls can cause injuries. They can happen to people of all ages. There are many things you can do to make your home safe and to help prevent falls. What can I do on the outside of my home?  Regularly fix the edges of walkways and driveways and fix any cracks.  Remove anything that might make you trip as you walk through a door, such as a raised step or threshold.  Trim any bushes or trees on the path to your home.  Use bright outdoor lighting.  Clear any walking paths of anything that might make someone trip, such as rocks or tools.  Regularly check to see if handrails are loose or broken. Make sure that both sides of any steps have handrails.  Any raised decks and porches should have guardrails on the edges.  Have any leaves, snow, or ice cleared regularly.  Use sand or salt on walking paths during winter.  Clean up any spills in your garage right away. This includes oil or grease spills. What can I do in the bathroom?  Use night lights.  Install grab bars by the toilet and in the tub and shower. Do not use towel bars as grab bars.  Use non-skid mats or decals in the tub or shower.  If you need to sit down in the shower, use a plastic, non-slip stool.  Keep the floor dry. Clean up any water that spills on the  floor as soon as it happens.  Remove soap buildup in the tub or shower regularly.  Attach bath mats securely with double-sided non-slip rug tape.  Do not have throw rugs and other things on the floor that can make you trip. What can I do in the bedroom?  Use night lights.  Make sure that you have a light by your bed that is easy to reach.  Do not use any sheets or blankets that are too big for your bed. They should not hang down onto the floor.  Have a firm chair that has side arms. You can use this for support while you get dressed.  Do not have throw rugs and other things on the floor that can make you trip. What can I do in the kitchen?  Clean up any spills right away.  Avoid walking on wet floors.  Keep items that you use a lot in easy-to-reach places.  If you need to reach something above you, use a strong step stool that has a grab bar.  Keep electrical cords out of the way.  Do not use floor polish or wax that makes floors slippery. If you must use wax, use non-skid floor wax.  Do not have throw rugs and other  things on the floor that can make you trip. What can I do with my stairs?  Do not leave any items on the stairs.  Make sure that there are handrails on both sides of the stairs and use them. Fix handrails that are broken or loose. Make sure that handrails are as long as the stairways.  Check any carpeting to make sure that it is firmly attached to the stairs. Fix any carpet that is loose or worn.  Avoid having throw rugs at the top or bottom of the stairs. If you do have throw rugs, attach them to the floor with carpet tape.  Make sure that you have a light switch at the top of the stairs and the bottom of the stairs. If you do not have them, ask someone to add them for you. What else can I do to help prevent falls?  Wear shoes that:  Do not have high heels.  Have rubber bottoms.  Are comfortable and fit you well.  Are closed at the toe. Do not wear  sandals.  If you use a stepladder:  Make sure that it is fully opened. Do not climb a closed stepladder.  Make sure that both sides of the stepladder are locked into place.  Ask someone to hold it for you, if possible.  Clearly mark and make sure that you can see:  Any grab bars or handrails.  First and last steps.  Where the edge of each step is.  Use tools that help you move around (mobility aids) if they are needed. These include:  Canes.  Walkers.  Scooters.  Crutches.  Turn on the lights when you go into a dark area. Replace any light bulbs as soon as they burn out.  Set up your furniture so you have a clear path. Avoid moving your furniture around.  If any of your floors are uneven, fix them.  If there are any pets around you, be aware of where they are.  Review your medicines with your doctor. Some medicines can make you feel dizzy. This can increase your chance of falling. Ask your doctor what other things that you can do to help prevent falls. This information is not intended to replace advice given to you by your health care provider. Make sure you discuss any questions you have with your health care provider. Document Released: 10/25/2008 Document Revised: 06/06/2015 Document Reviewed: 02/02/2014 Elsevier Interactive Patient Education  2017 [employer].

## 2020-05-03 ENCOUNTER — Telehealth: Payer: Self-pay | Admitting: Family Medicine

## 2020-05-03 ENCOUNTER — Other Ambulatory Visit: Payer: Self-pay | Admitting: Family Medicine

## 2020-05-03 MED ORDER — COLCHICINE 0.6 MG PO TABS: ORAL_TABLET | ORAL | 0 refills | Status: DC

## 2020-05-03 NOTE — Telephone Encounter (Signed)
See below

## 2020-05-03 NOTE — Telephone Encounter (Signed)
I prescribed him a lower dose of Colchicine, only 3 tabs total hopefully that will help and hydrate well.

## 2020-05-03 NOTE — Telephone Encounter (Signed)
Caller : Fred  Call Back @ [phone]  Patient states he has a gout flare up, patient states it's to the point where he can not put weight on foot. Patient state he would like a refill of recommendation on what he can take since he has had a kidney removed.  Please advise

## 2020-05-03 NOTE — Telephone Encounter (Signed)
Sent message on my chart

## 2020-05-06 ENCOUNTER — Ambulatory Visit (INDEPENDENT_AMBULATORY_CARE_PROVIDER_SITE_OTHER): Payer: Medicare Other | Admitting: Family Medicine

## 2020-05-06 ENCOUNTER — Other Ambulatory Visit: Payer: Self-pay

## 2020-05-06 ENCOUNTER — Encounter: Payer: Self-pay | Admitting: Family Medicine

## 2020-05-06 VITALS — BP 146/84 | HR 67 | Temp 98.2°F | Ht 72.0 in | Wt 251.2 lb

## 2020-05-06 DIAGNOSIS — M109 Gout, unspecified: Secondary | ICD-10-CM | POA: Diagnosis not present

## 2020-05-06 MED ORDER — METHYLPREDNISOLONE ACETATE 80 MG/ML IJ SUSP
80.0000 mg | Freq: Once | INTRAMUSCULAR | Status: AC
Start: 2020-05-06 — End: 2020-05-06
  Administered 2020-05-06: 80 mg via INTRAMUSCULAR

## 2020-05-06 MED ORDER — COLCHICINE 0.6 MG PO TABS: ORAL_TABLET | ORAL | 0 refills | Status: DC

## 2020-05-06 NOTE — Addendum Note (Signed)
Addended by: Sharon Seller B on: 05/06/2020 02:00 PM   Modules accepted: Orders

## 2020-05-06 NOTE — Patient Instructions (Addendum)
Ice/cold pack over area for 10-15 min twice daily.  Use Colchicine in the future for flares. If you start getting more frequent flares, revisit with Dr. Charlett Blake to discuss going on a low dose allopurinol.   Foods to AVOID: Red meat, organ meat (liver), lunch meat, seafood (mussels, scallops, anchovies, etc) Alcohol Sugary foods/beverages (diet soft drinks have no link to flares)  Foods to migrate to: Dairy Vegetables Cherries have limited data to suggest they help lower uric acid levels (and prevent flares) Vit C (500 mg daily) may have a modest effect with preventing flares Poultry If you are going to eat red meat, beef and pork may give you less problems than lamb.  Let us know if you need anything.

## 2020-05-06 NOTE — Progress Notes (Signed)
Chief Complaint  Patient presents with  . Ankle Pain    David [name] is a 75 y.o. male here for gout.  Currently being treated with nothing. The joint(s) affected include: L ankle Most recent uric acid level is: 8 Reports compliance. Had a flare, started on a 3 d course of colchicine which helped, still having some slight pain.  Side effects of medications: None Is avoiding seafood, sweet/sugary beverages, alcohol, and red meats.  Past Medical History:  Diagnosis Date  . Adenoma of right adrenal gland 07/11/2002   2.4cm , noted on CT ABD  . Allergy   . Anxiety   . Arthritis of both knees 03/26/2016  . Astigmatism   . Back pain   . BCC (basal cell carcinoma of skin)    under right eye and right ear  . Blood transfusion without reported diagnosis   . Cataract 09/29/2016  . Depression   . Diverticulitis 2009  . Diverticulosis   . Family history of breast cancer   . Family history of colon cancer   . Fatty liver   . GERD (gastroesophageal reflux disease)   . Gout   . Hearing loss of both ears 03/26/2016  . History of atrial fibrillation    x 2 none since 2020  . History of chronic prostatitis    started at age 89  . History of kidney stones   . Hyperglycemia   . Hyperlipidemia   . Hypertension   . Incomplete right bundle branch block (RBBB)   . Internal hemorrhoids   . Kidney lesion 06/07/2015   Right midportion, 1.1x1.1 cm hyperechoic, noted on [place] ABD  . LAFB (left anterior fascicular block)   . Left anterior fascicular block 08/06/2017   Noted on EKG  . Lipoma of axilla 09/2016  . Liver lesion, right lobe 06/07/2015   2.5x2.4x2.4 cm hypoechoic lesion posterior aspect, noted on [place] ABD  . Low back pain 03/26/2016  . LVH (left ventricular hypertrophy) 08/06/2017   Moderate, Noted on ECHO  . Medicare annual wellness visit, subsequent 03/12/2014  . Muscle cramps   . Nocturia   . OA (osteoarthritis)    Back, Hands, Neck  . Obesity 11/25/2007   Qualifier:  Diagnosis of  By: Lenna Gilford MD, Deborra Medina   . Other malaise and fatigue 03/13/2013  . Penis symptom or sign    PENIS sensitive under urethra and itches  . Premature ventricular complex   . Prostate cancer ([place]) dx 2021  . Renal insufficiency    Kidney removed right  . Scoliosis    Upper thoracic and lumbar  . Sinus bradycardia 08/06/2017   Noted on EKG  . Vitamin D deficiency    resolved    BP (!) 146/84 (BP Location: Left Arm, Patient Position: Sitting, Cuff Size: Normal)   Pulse 67   Temp 98.2 F (36.8 C) (Oral)   Ht 6' (1.829 m)   Wt 251 lb 4 oz (114 kg)   SpO2 98%   BMI 34.08 kg/m  Gen: Awake, alert, appears stated age Neck: No masses or asymmetry Heart: RRR, no murmurs, no bruits, no LE edema Lungs: CTAB, no accessory muscle use MSK: no swelling or TTP, normal gait Skin: No erythema or warmth Psych: Age appropriate judgment and insight, nml mood and affect  Acute gout of left ankle, unspecified cause  OK to use colchicine in future. Steroid shot today to help knock out rest of pain.  Reminded to avoid foods like alcohol, sweet beverages, red  meat, lunch meat, sea food. F/u as originally scheduled w reg pcp. The patient voiced understanding and agreement to the plan.  [place], DO 05/06/20 1:35 PM

## 2020-05-20 ENCOUNTER — Other Ambulatory Visit: Payer: Self-pay

## 2020-05-20 DIAGNOSIS — R351 Nocturia: Secondary | ICD-10-CM

## 2020-05-20 MED ORDER — COLCHICINE 0.6 MG PO TABS: ORAL_TABLET | ORAL | 0 refills | Status: AC

## 2020-06-17 IMAGING — CT CT ABD-PEL WO/W CM
3 of 10 series · 12 of 46 positions shown, 18 images · IV contrast (APPLIED)
Comparison: CT 08/01/2007

CLINICAL DATA: Hematuria several weeks.

EXAM:
CT ABDOMEN AND PELVIS WITHOUT AND WITH CONTRAST
TECHNIQUE: Multidetector CT imaging of the abdomen and pelvis was performed
following the standard protocol before and following the bolus
administration of intravenous contrast.
CONTRAST:  80mL APNJPM-88T IOPAMIDOL (APNJPM-88T) INJECTION 76%

[Series 2: axial pre · axial · non-contrast · 0.98mm/px · z∈[-552,-97]mm · 8 of 117 slices shown, 13 images]
[im 13/117  soft-tissue]
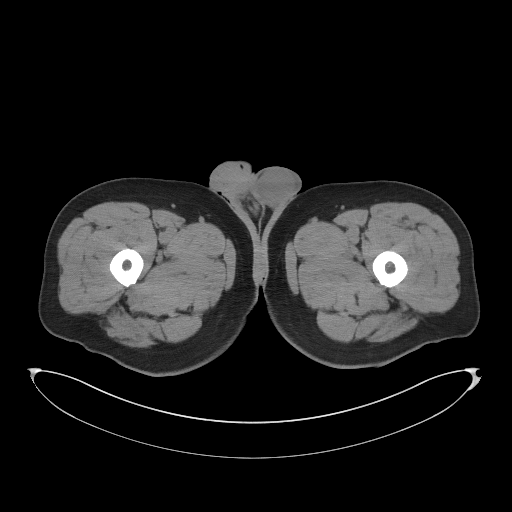
[im 13/117  bone]
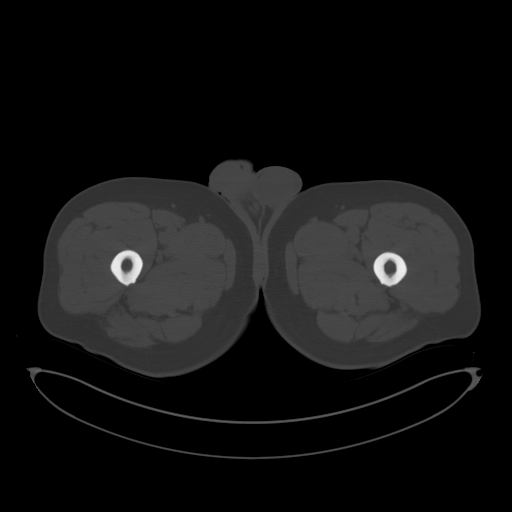
[im 26/117  soft-tissue]
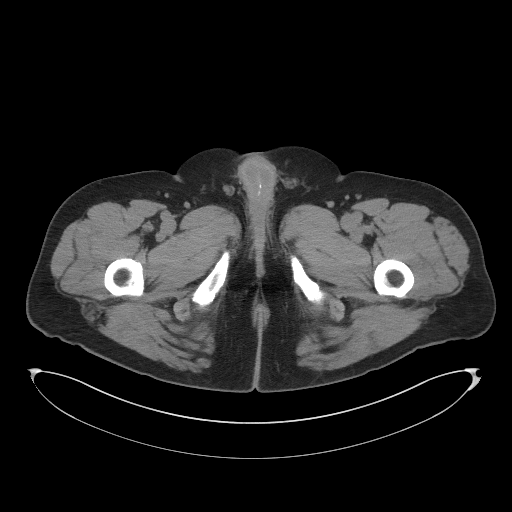
[im 39/117  soft-tissue]
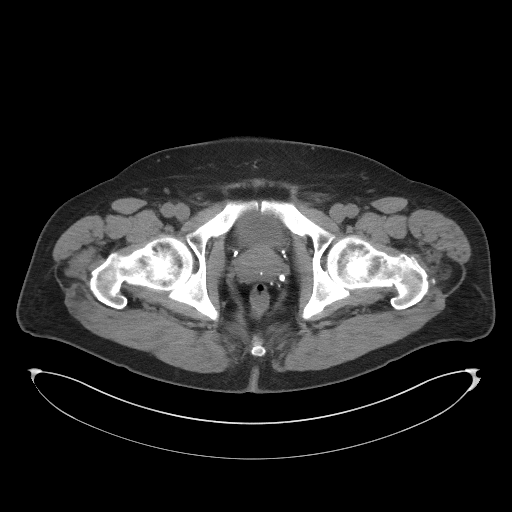
[im 52/117  soft-tissue]
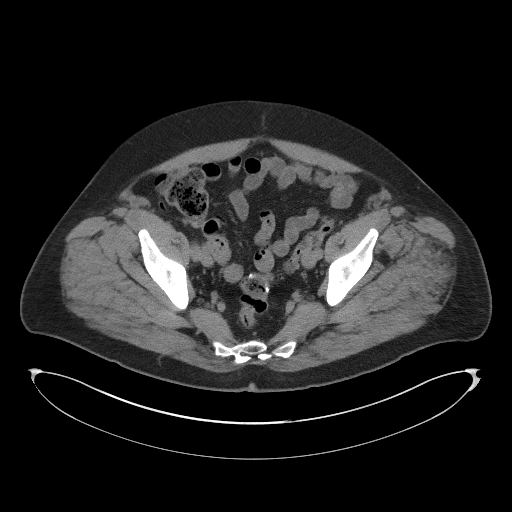
[im 65/117  soft-tissue]
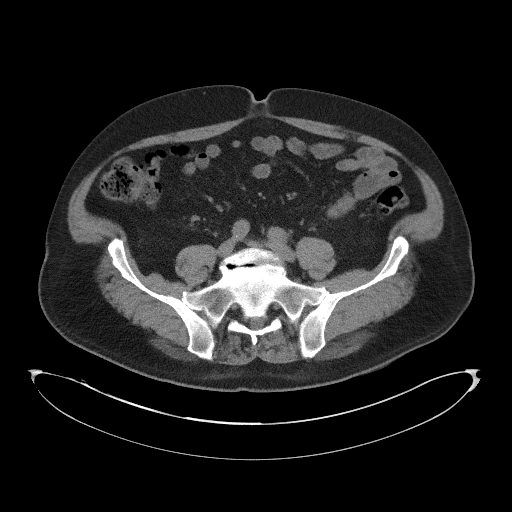
[im 65/117  lung]
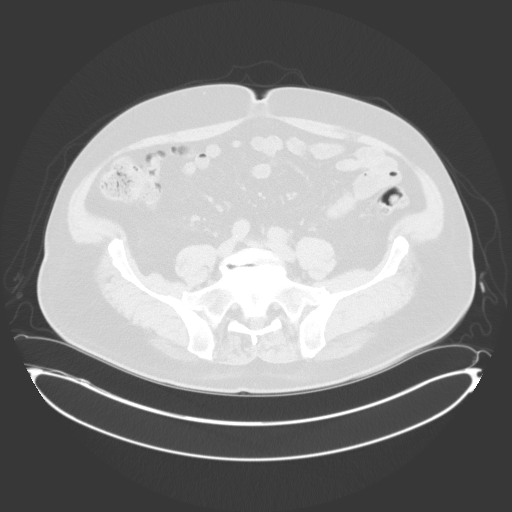
[im 78/117  soft-tissue]
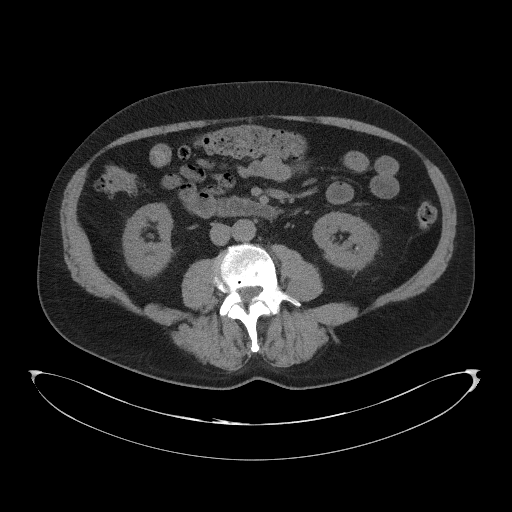
[im 78/117  lung]
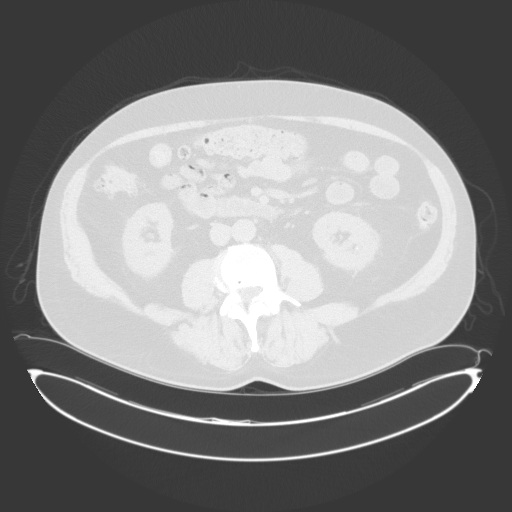
[im 91/117  soft-tissue]
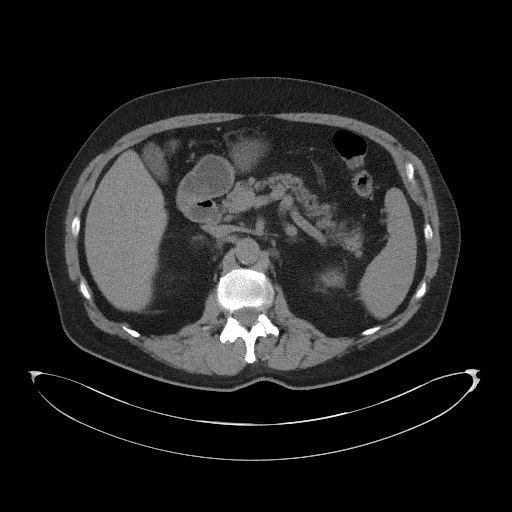
[im 91/117  lung]
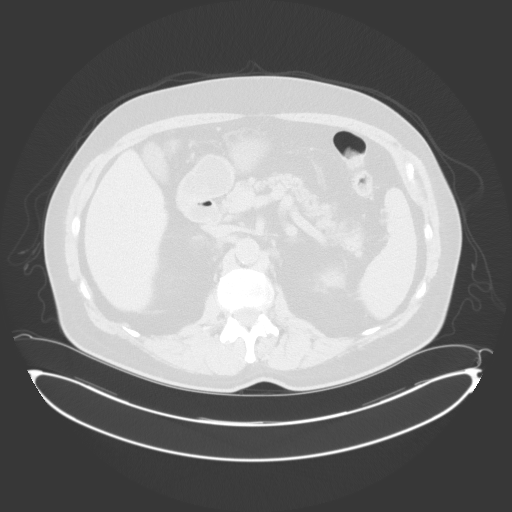
[im 104/117  soft-tissue]
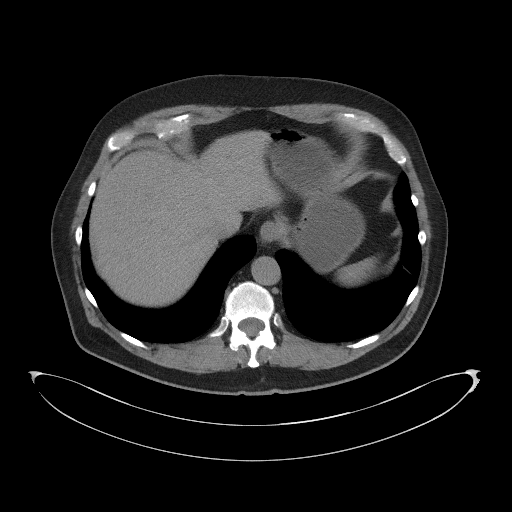
[im 104/117  lung]
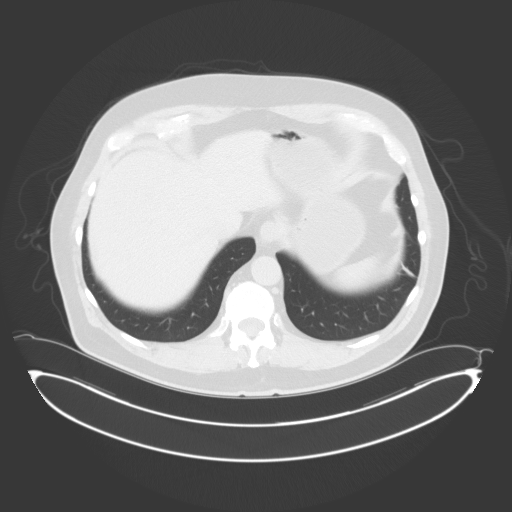

[Series 4: coronal pre · coronal · non-contrast · 1.05mm/px · 2 of 108 slices shown, 3 images]
[im 36/108  soft-tissue]
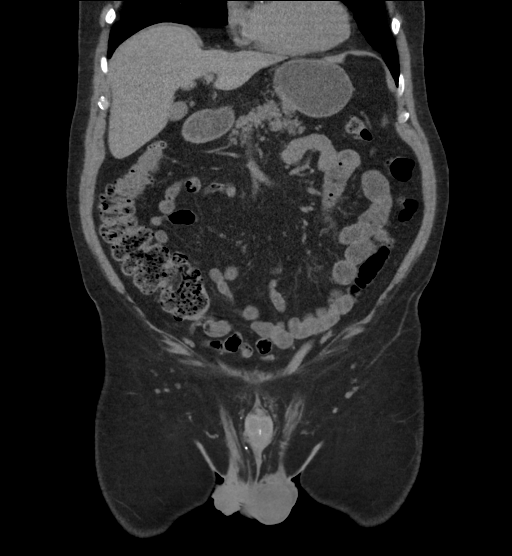
[im 36/108  bone]
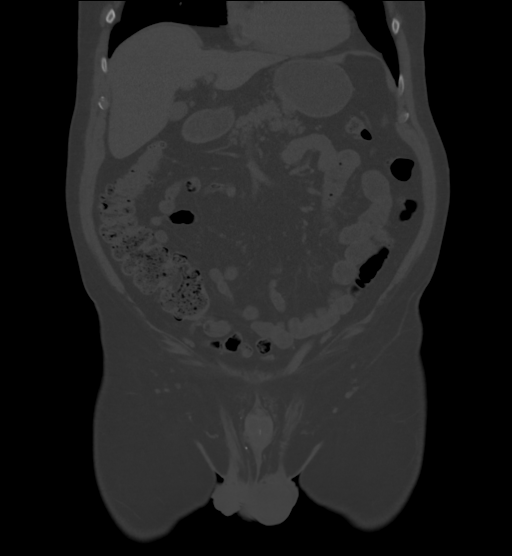
[im 72/108  soft-tissue]
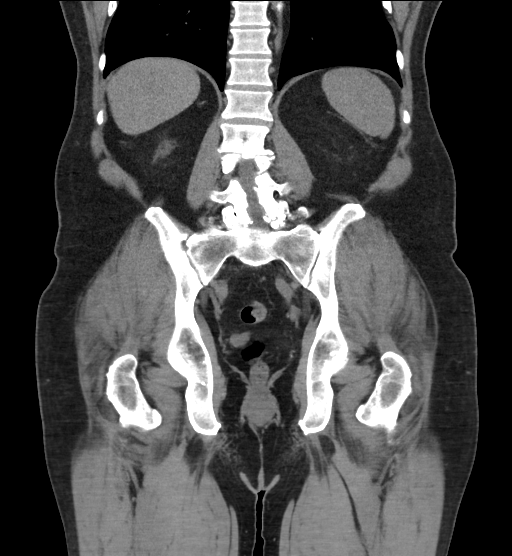

[Series 6: axial post · axial · 0.98mm/px · z∈[-542,-467]mm · 2 of 117 slices shown]
[im 15/117  soft-tissue]
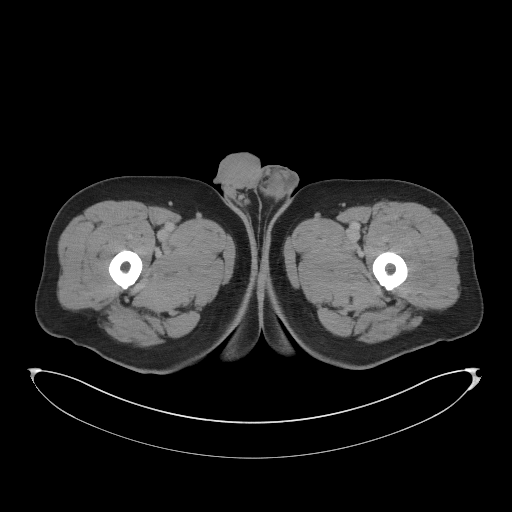
[im 30/117  soft-tissue]
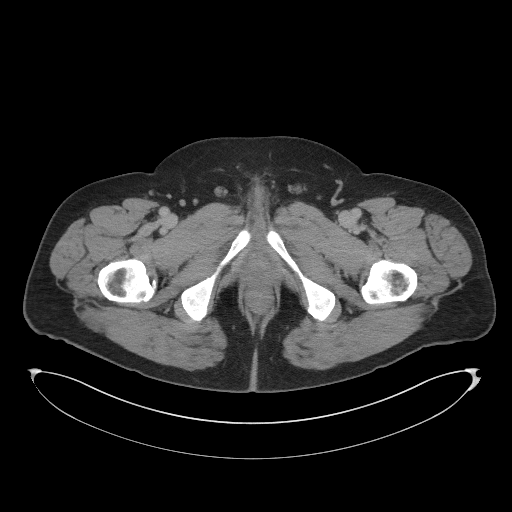

[12 of 46 positions shown; findings below may reference images not displayed]

FINDINGS: Lower chest: Lung bases are clear.

Hepatobiliary: No focal hepatic lesion. No biliary duct dilatation.
Gallbladder is normal. Common bile duct is normal.

Pancreas: Pancreas is normal. No ductal dilatation. No pancreatic
inflammation.

Spleen: Normal spleen

Adrenals/urinary tract: Adrenal glands normal

Two calculi in the RIGHT kidney measuring 3 mm and 5 mm. Two
punctate 1 mm calculi in LEFT kidney. No LEFT ureterolithiasis

Within the RIGHT ureter there is a nonobstructing calculus measuring
8 mm (image 49/2. This calculus is in the mid RIGHT ureter at the L5
vertebral body level no bladder calculi.

No enhancing renal cortical lesion. Several small hypodense lesions
in the RIGHT kidney too small characterize.

Delayed imaging demonstrates no filling defects within collecting
systems ureters. Excreted contrast is not obstructed by the RIGHT
ureteral calculus.

No bladder calculi, enhancing bladder lesions, or filling defect
within the bladder.

Stomach/Bowel: Stomach, small bowel, appendix, and cecum are normal.
The colon and rectosigmoid colon are normal. Anastomosis at the
rectosigmoid junction

Vascular/Lymphatic: Abdominal aorta is normal caliber with
atherosclerotic calcification. There is no retroperitoneal or
periportal lymphadenopathy. No pelvic lymphadenopathy.

Reproductive: Prostate normal.

Other: No free fluid.

Musculoskeletal: No aggressive osseous lesion.
IMPRESSION: 1. Nonobstructing calculus in the mid RIGHT ureter.
2. Bilateral nephrolithiasis.
3. No bladder stones or filling defects in the bladder which does
not excluded a bladder lesion.

## 2020-06-20 ENCOUNTER — Other Ambulatory Visit: Payer: Self-pay | Admitting: Family Medicine

## 2020-06-20 NOTE — Telephone Encounter (Signed)
Requesting: lorazepam 1mg   Contract: 03/30/2017 UDS: 12/28/2017 Last Visit: 02/12/2020 Next Visit: 09/02/2020 Last Refill: 11/14/2019 #180 and 1RF  Please Advise

## 2020-07-02 DIAGNOSIS — N1831 Chronic kidney disease, stage 3a: Secondary | ICD-10-CM | POA: Insufficient documentation

## 2020-07-02 NOTE — Progress Notes (Deleted)
So    Cardiology Office Note   Date:  07/02/2020   ID:  David, [name] [birth year], MRN [id number]  PCP:  Mosie Lukes, MD  Cardiologist:   Minus Breeding, MD   No chief complaint on file.     History of Present Illness: David [name] is a 75 y.o. male who presents for follow up of atrial fib.  He has been seen in the Atrial Fib clinic.  He has had cardioversion.  He was in the ED in Aug with PAF.  ***     ***  I reviewed these records for this visit.   He said he wanted atrial fibrillation he thought related to Proscar.  He stopped taking this.  However, he had fibrillation that persisted for couple of days.  He went to the ER because it was not stopping and it was quite irregular.  He did not have any presyncope or syncope but he followed a little lightheaded.  He said he was in fibrillation when he left to the emergency room but it went back in the sinus rhythm perhaps a day later.  He has not had any since this event.  He denies any chest discomfort, neck or arm discomfort.  He is not having any shortness of breath, PND or orthopnea.  He has had no weight gain or edema.  Of note he did have prostate cancer with seed implants.  Previously when he was taking Xarelto he had hematuria but has not had any problems with that now.  He is walking 7 to 8 miles per week.  He has no problems with this.   Past Medical History:  Diagnosis Date   Adenoma of right adrenal gland 07/11/2002   2.4cm , noted on CT ABD   Allergy    Anxiety    Arthritis of both knees 03/26/2016   Astigmatism    Back pain    BCC (basal cell carcinoma of skin)    under right eye and right ear   Blood transfusion without reported diagnosis    Cataract 09/29/2016   Depression    Diverticulitis 2009   Diverticulosis    Family history of breast cancer    Family history of colon cancer    Fatty liver    GERD (gastroesophageal reflux disease)    Gout    Hearing loss of both ears 03/26/2016   History of  atrial fibrillation    x 2 none since 2020   History of chronic prostatitis    started at age 56   History of kidney stones    Hyperglycemia    Hyperlipidemia    Hypertension    Incomplete right bundle branch block (RBBB)    Internal hemorrhoids    Kidney lesion 06/07/2015   Right midportion, 1.1x1.1 cm hyperechoic, noted on [place] ABD   LAFB (left anterior fascicular block)    Left anterior fascicular block 08/06/2017   Noted on EKG   Lipoma of axilla 09/2016   Liver lesion, right lobe 06/07/2015   2.5x2.4x2.4 cm hypoechoic lesion posterior aspect, noted on [place] ABD   Low back pain 03/26/2016   LVH (left ventricular hypertrophy) 08/06/2017   Moderate, Noted on ECHO   Medicare annual wellness visit, subsequent 03/12/2014   Muscle cramps    Nocturia    OA (osteoarthritis)    Back, Hands, Neck   Obesity 11/25/2007   Qualifier: Diagnosis of  By: Lenna Gilford MD, Deborra Medina    Other malaise and fatigue  03/13/2013   Penis symptom or sign    PENIS sensitive under urethra and itches   Premature ventricular complex    Prostate cancer ([place]) dx 2021   Renal insufficiency    Kidney removed right   Scoliosis    Upper thoracic and lumbar   Sinus bradycardia 08/06/2017   Noted on EKG   Vitamin D deficiency    resolved    Past Surgical History:  Procedure Laterality Date   CARDIOVERSION  07/31/2017   CATARACT EXTRACTION, BILATERAL     COLON SURGERY  2009   segmental sigmoid resection   COLONOSCOPY     CYSTOSCOPY N/A 07/28/2019   Procedure: CYSTOSCOPY FLEXIBLE;  Surgeon: Cleon Gustin, MD;  Location: [facility];  Service: Urology;  Laterality: N/A;   CYSTOSCOPY W/ RETROGRADES Right 06/07/2018   Procedure: CYSTOSCOPY WITH RETROGRADE PYELOGRAM, uphrostogram;  Surgeon: Cleon Gustin, MD;  Location: [facility];  Service: Urology;  Laterality: Right;   CYSTOSCOPY WITH URETEROSCOPY AND STENT PLACEMENT Right 02/28/2018   Procedure: CYSTOSCOPY WITH URETEROSCOPY [place];  Surgeon:  Kathie Rhodes, MD;  Location: [facility];  Service: Urology;  Laterality: Right;   CYSTOSCOPY/URETEROSCOPY/HOLMIUM LASER/STENT PLACEMENT Right 11/29/2017   Procedure: CYSTOSCOPY/RETROGRADE/URETEROSCOPY/HOLMIUM LASER/STENT PLACEMENT;  Surgeon: Kathie Rhodes, MD;  Location: [facility];  Service: Urology;  Laterality: Right;   EYE SURGERY Bilateral 01/12/2017   cataract removal   history of blood tranfusion  age 89   HOLMIUM LASER APPLICATION Right 9/89/2119   Procedure: HOLMIUM LASER APPLICATION;  Surgeon: Kathie Rhodes, MD;  Location: [facility];  Service: Urology;  Laterality: Right;   IR BALLOON DILATION URETERAL STRICTURE RIGHT  03/14/2018   IR NEPHRO TUBE REMOV/FL  03/17/2018   IR NEPHROSTOGRAM RIGHT THRU EXISTING ACCESS  03/17/2018   IR NEPHROSTOMY EXCHANGE RIGHT  03/14/2018   IR NEPHROSTOMY EXCHANGE RIGHT  06/21/2018   IR NEPHROSTOMY PLACEMENT RIGHT  03/02/2018   IR NEPHROSTOMY PLACEMENT RIGHT  04/20/2018   IR URETERAL STENT PLACEMENT EXISTING ACCESS RIGHT  03/14/2018   NOSE SURGERY     Submucous resection age 79   NUCLEAR STRESS TEST  06/03/2009   RADIOACTIVE SEED IMPLANT N/A 07/28/2019   Procedure: RADIOACTIVE SEED IMPLANT/BRACHYTHERAPY IMPLANT;  Surgeon: Cleon Gustin, MD;  Location: [facility];  Service: Urology;  Laterality: N/A;  90 MINS   ROBOT ASSISTED LAPAROSCOPIC NEPHRECTOMY Right 08/04/2018   Procedure: XI ROBOTIC ASSISTED LAPAROSCOPIC NEPHRECTOMY;  Surgeon: Cleon Gustin, MD;  Location: [facility];  Service: Urology;  Laterality: Right;  3 HRS   ROBOT ASSISTED PYELOPLASTY Right 06/07/2018   Procedure: attempted XI ROBOTIC ASSISTED PYELOPLASTY, lysis of adhesions;  Surgeon: Cleon Gustin, MD;  Location: [facility];  Service: Urology;  Laterality: Right;  3 HRS   SKIN BIOPSY     SPACE OAR INSTILLATION N/A 07/28/2019   Procedure: SPACE OAR INSTILLATION;  Surgeon: Cleon Gustin, MD;  Location[facility];  Service:  Urology;  Laterality: N/A;   URETEROSCOPY WITH HOLMIUM LASER LITHOTRIPSY Right 09/24/2017   Procedure: CYSTOSCOPY, URETEROSCOPY WITH HOLMIUM LASER LITHOTRIPSY, STENT PLACEMENT;  Surgeon: Kathie Rhodes, MD;  Location: [facility];  Service: Urology;  Laterality: Right;     Current Outpatient Medications  Medication Sig Dispense Refill   apixaban (ELIQUIS) 5 MG TABS tablet Take 1 tablet (5 mg total) by mouth 2 (two) times daily. 180 tablet 3   ascorbic acid (VITAMIN C) 1000 MG tablet Take by mouth.  atenolol (TENORMIN) 25 MG tablet TAKE ONE TABLET BY MOUTH EVERY MORNING, ONE TABLET AT NOON, AND ONE TABLET EVERY NIGHT AT BEDTIME 270 tablet 1   atorvastatin (LIPITOR) 10 MG tablet Take 1 tablet (10 mg total) by mouth in the morning. 90 tablet 1   azelastine (ASTELIN) 0.1 % nasal spray Place 2 sprays into both nostrils 2 (two) times daily. 30 mL 6   clotrimazole-betamethasone (LOTRISONE) lotion Apply topically 2 (two) times daily. For 2 weeks. 30 mL 0   colchicine 0.6 MG tablet For gout flares, take 1 tab po once then repeat in 1 hour as needed if pain persists up to a total of 3 doses 15 tablet 0   fluconazole (DIFLUCAN) 150 MG tablet Take 1 tablet (150 mg total) by mouth once a week. 4 tablet 1   FLUoxetine (PROZAC) 10 MG tablet Take 1 tablet (10 mg total) by mouth 2 (two) times daily. 180 tablet 1   LORazepam (ATIVAN) 1 MG tablet TAKE ONE TABLET BY MOUTH TWICE A DAY AS NEEDED FOR ANXIETY OR SEDATION 180 tablet 2   omeprazole (PRILOSEC) 20 MG capsule Take 20 mg by mouth daily as needed (acid reflux).      tamsulosin (FLOMAX) 0.4 MG CAPS capsule Take 0.4 mg by mouth daily.     traMADol (ULTRAM) 50 MG tablet Take 1 tablet (50 mg total) by mouth every 6 (six) hours as needed for moderate pain. 15 tablet 0   No current facility-administered medications for this visit.    Allergies:   Cefaclor, Cephalosporins, and Penicillins    ROS:  Please see the history of present illness.    Otherwise, review of systems are positive for ***.   All other systems are reviewed and negative.    PHYSICAL EXAM: VS:  There were no vitals taken for this visit. , BMI There is no height or weight on file to calculate BMI. GENERAL:  Well appearing NECK:  No jugular venous distention, waveform within normal limits, carotid upstroke brisk and symmetric, no bruits, no thyromegaly LUNGS:  Clear to auscultation bilaterally CHEST:  Unremarkable HEART:  PMI not displaced or sustained,S1 and S2 within normal limits, no S3, no S4, no clicks, no rubs, *** murmurs ABD:  Flat, positive bowel sounds normal in frequency in pitch, no bruits, no rebound, no guarding, no midline pulsatile mass, no hepatomegaly, no splenomegaly EXT:  2 plus pulses throughout, no edema, no cyanosis no clubbing    ***GENERAL:  Well appearing NECK:  No jugular venous distention, waveform within normal limits, carotid upstroke brisk and symmetric, no bruits, no thyromegaly LUNGS:  Clear to auscultation bilaterally CHEST:  Unremarkable HEART:  PMI not displaced or sustained,S1 and S2 within normal limits, no S3, no S4, no clicks, no rubs, no murmurs ABD:  Flat, positive bowel sounds normal in frequency in pitch, no bruits, no rebound, no guarding, no midline pulsatile mass, no hepatomegaly, no splenomegaly EXT:  2 plus pulses throughout, no edema, no cyanosis no clubbing   EKG:  EKG is *** ordered today. Sinus rhythm, rate ***, left axis deviation, poor anterior R wave progression, no acute ST-T wave changes.  Recent Labs: 08/20/2019: Magnesium 1.8; TSH 3.059 02/05/2020: ALT 18; BUN 19; Creatinine, Ser 1.86; Hemoglobin 13.9; Platelets 187.0; Potassium 4.2; Sodium 138    Lipid Panel    Component Value Date/Time   CHOL 225 (H) 07/21/2019 0845   TRIG 159.0 (H) 07/21/2019 0845   HDL 38.20 (L) 07/21/2019 0845   CHOLHDL 6 07/21/2019  0845   VLDL 31.8 07/21/2019 0845   LDLCALC 155 (H) 07/21/2019 0845   LDLCALC 179 (H)  07/01/2018 1446   LDLDIRECT 168.5 03/29/2006 1007      Wt Readings from Last 3 Encounters:  05/06/20 251 lb 4 oz (114 kg)  02/15/20 255 lb (115.7 kg)  02/12/20 253 lb 9.6 oz (115 kg)      Other studies Reviewed: Additional studies/ records that were reviewed today include: *** Review of the above records demonstrates:  Please see elsewhere in the note.     ASSESSMENT AND PLAN:  ATRIAL FIB:    David [name] has a CHA2DS2 - VASc score of 2.    ***  Given the recurrence of this he does need to be back on blood thinners and I agree with this.   At this point he has no contraindication and is not having hematuria as he had previously on Xarelto.  I agree with continuing the Eliquis and continuing the current dose of Cardizem.  I will check a CBC in 1 month.  He did have blood work to include a normal TSH earlier this month.   LVH:   ***  This was moderate.  I will follow this up likely with an echo next year.   HTN: Blood pressure is *** well controlled.  No change in therapy.   CKD IIIA: His creatinine is *** 1.98.  We will continue to follow this and is followed by his primary provider.  This has been stable.   Current medicines are reviewed at length with the patient today.  The patient does not have concerns regarding medicines.  The following changes have been made: ***  Labs/ tests ordered today include: ***  No orders of the defined types were placed in this encounter.    Disposition:   FU with me in *** months.     Signed, Minus Breeding, MD  07/02/2020 6:50 PM    [facility]

## 2020-07-03 ENCOUNTER — Ambulatory Visit: Payer: Medicare Other | Admitting: Cardiology

## 2020-07-03 DIAGNOSIS — I517 Cardiomegaly: Secondary | ICD-10-CM

## 2020-07-03 DIAGNOSIS — N1831 Chronic kidney disease, stage 3a: Secondary | ICD-10-CM

## 2020-07-03 DIAGNOSIS — I4819 Other persistent atrial fibrillation: Secondary | ICD-10-CM

## 2020-07-08 ENCOUNTER — Other Ambulatory Visit: Payer: Medicare Other

## 2020-07-24 IMAGING — DX DG ABDOMEN 1V
2 series · 2 of 2 positions shown · non-contrast
Comparison: CT 08/18/2017.  Plain film 09/08/2017.

CLINICAL DATA: Preop right ureteral stone

EXAM:
ABDOMEN - 1 VIEW

[abdomen kub (1 of 2)]
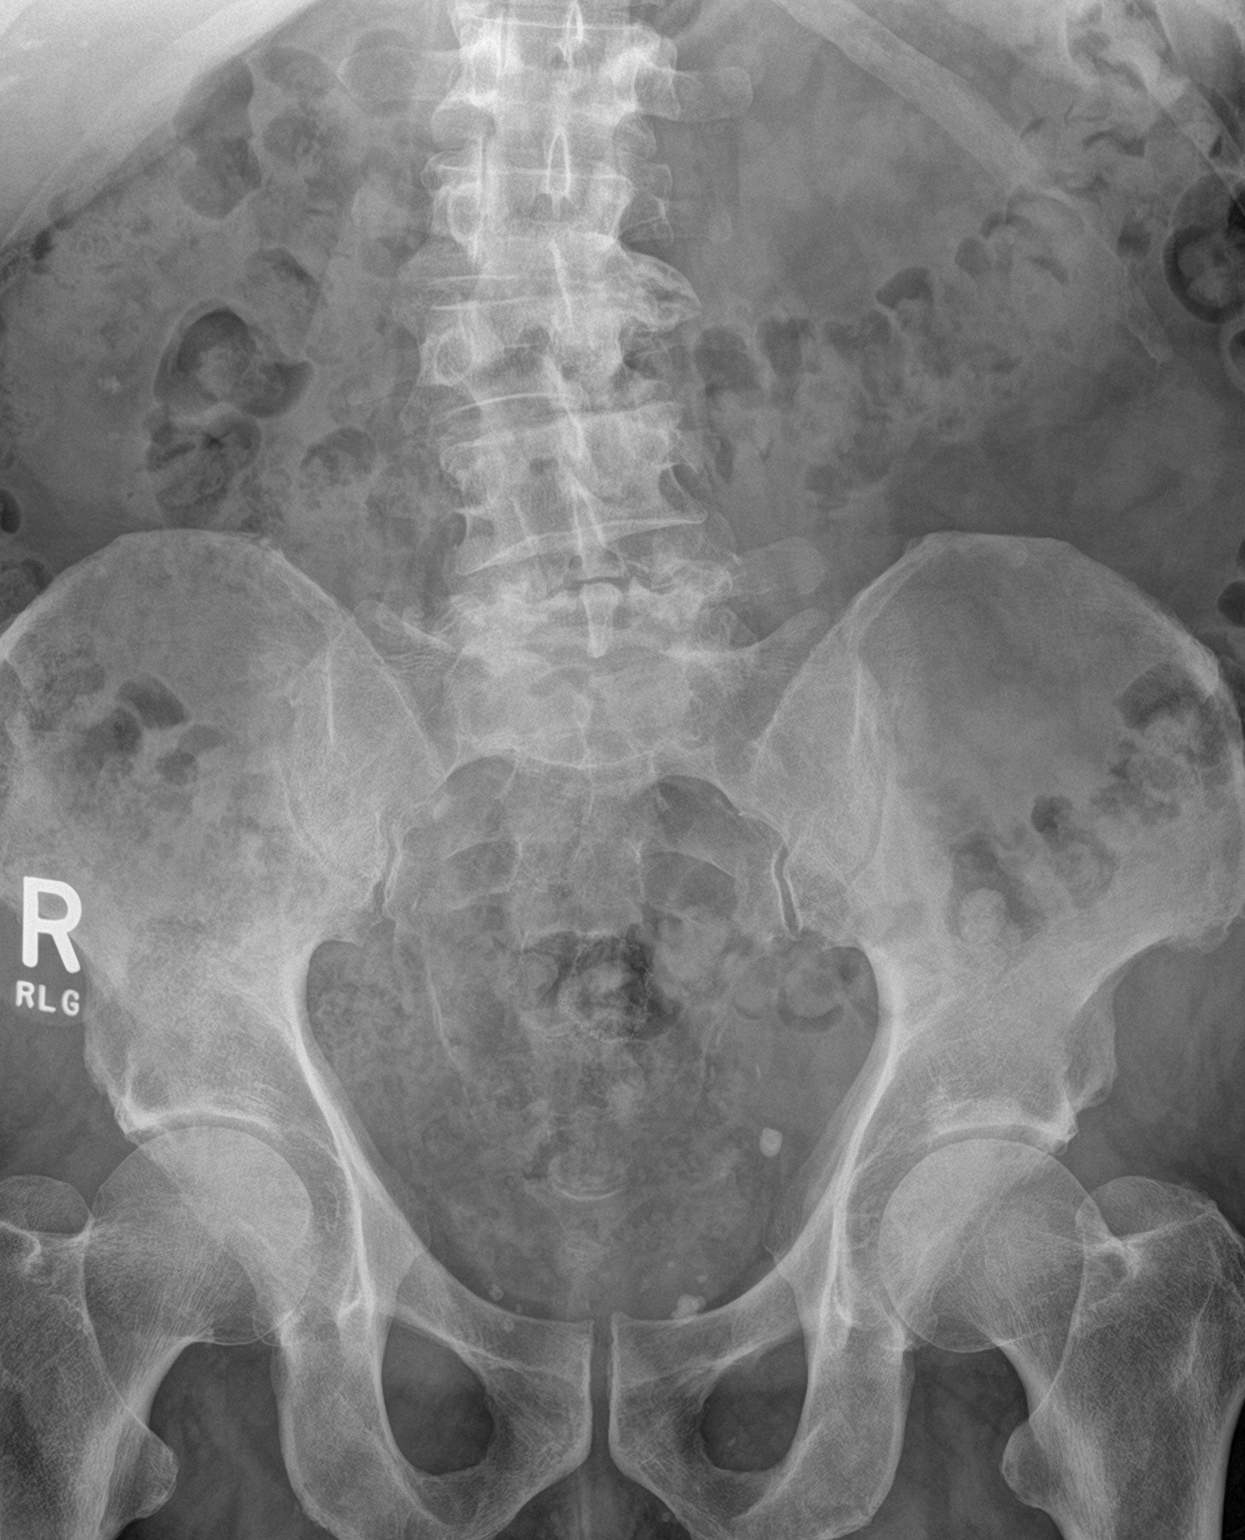

[abdomen kub (2 of 2)]
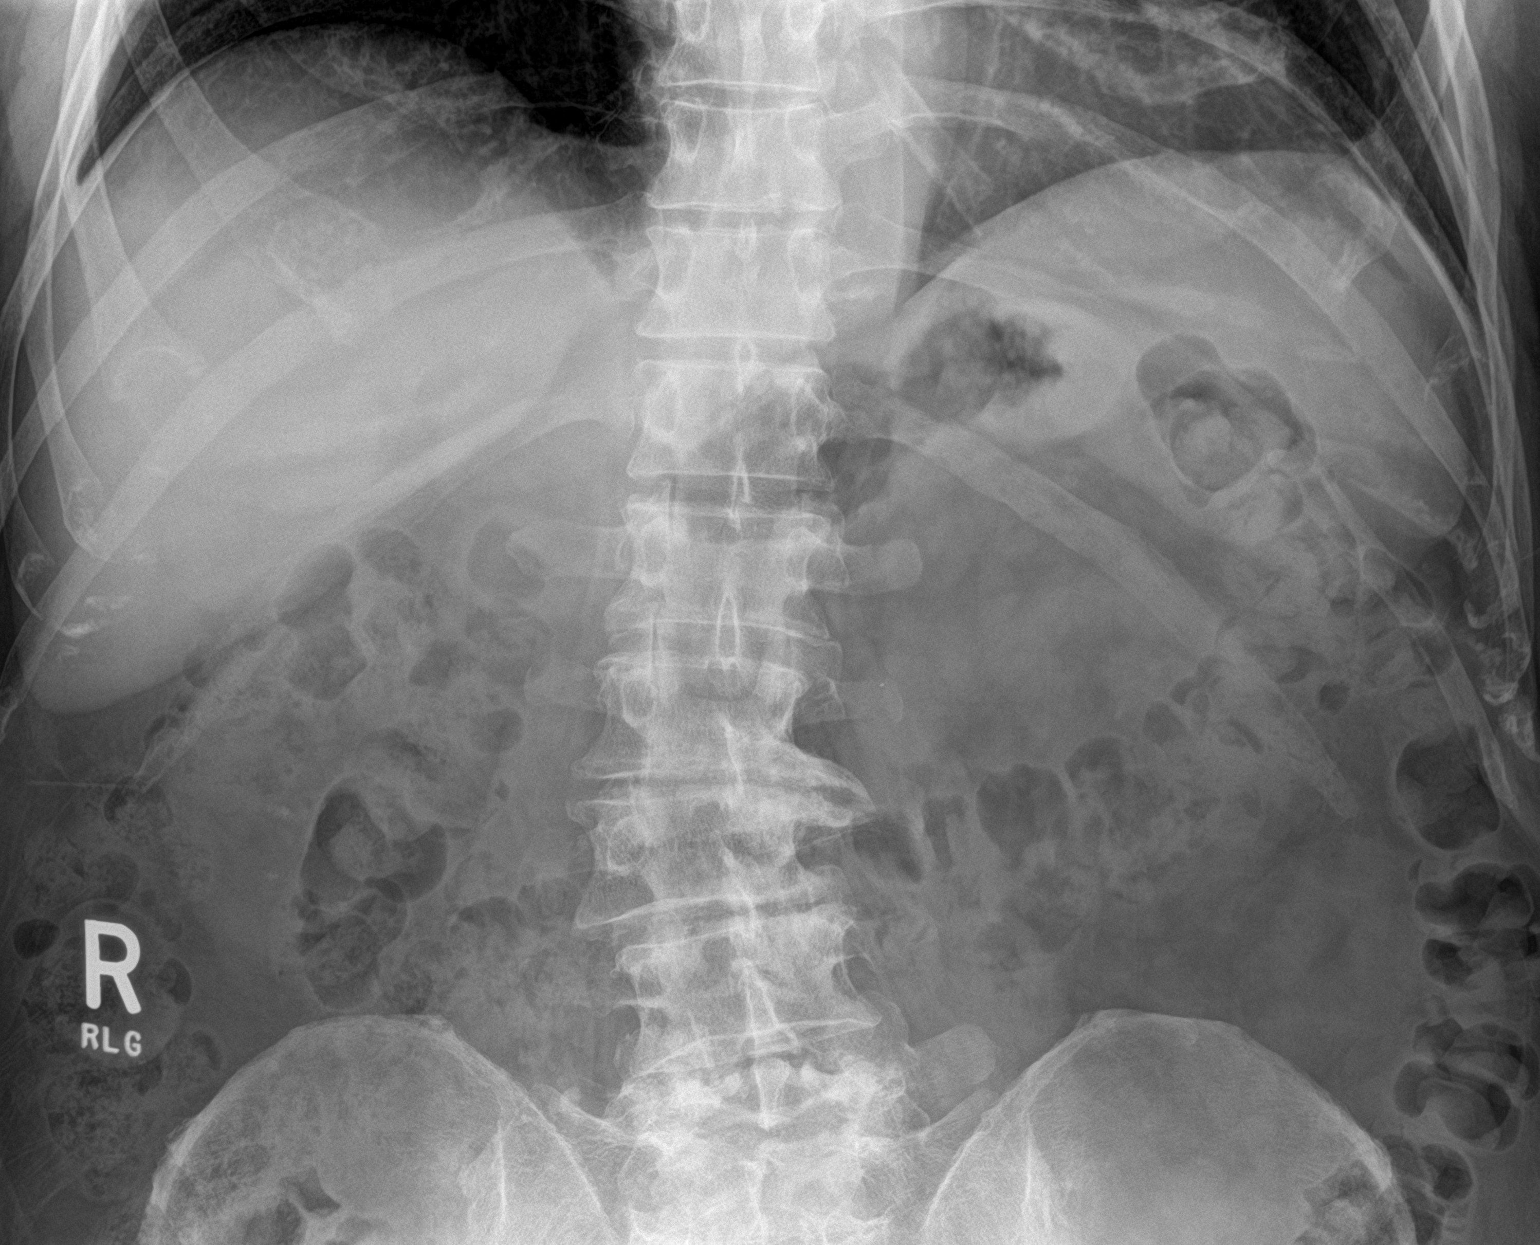

[2 of 2 positions shown; findings below may reference images not displayed]

FINDINGS: Mid right ureteral stone again noted near the superior aspect of the
right sacrum. Calcified phleboliths in the left pelvis. Right
nephrolithiasis. Moderate stool burden in the colon. No evidence of
bowel obstruction.
IMPRESSION: Mid right ureteral stone again noted, stable.

Right nephrolithiasis.

Moderate stool burden.

## 2020-08-05 ENCOUNTER — Other Ambulatory Visit (INDEPENDENT_AMBULATORY_CARE_PROVIDER_SITE_OTHER): Payer: Medicare Other

## 2020-08-05 ENCOUNTER — Other Ambulatory Visit: Payer: Self-pay

## 2020-08-05 DIAGNOSIS — E782 Mixed hyperlipidemia: Secondary | ICD-10-CM | POA: Diagnosis not present

## 2020-08-05 DIAGNOSIS — I10 Essential (primary) hypertension: Secondary | ICD-10-CM

## 2020-08-05 DIAGNOSIS — R351 Nocturia: Secondary | ICD-10-CM

## 2020-08-05 DIAGNOSIS — N289 Disorder of kidney and ureter, unspecified: Secondary | ICD-10-CM

## 2020-08-05 DIAGNOSIS — R7989 Other specified abnormal findings of blood chemistry: Secondary | ICD-10-CM

## 2020-08-05 DIAGNOSIS — R739 Hyperglycemia, unspecified: Secondary | ICD-10-CM | POA: Diagnosis not present

## 2020-08-05 DIAGNOSIS — M109 Gout, unspecified: Secondary | ICD-10-CM | POA: Diagnosis not present

## 2020-08-05 LAB — COMPREHENSIVE METABOLIC PANEL
ALT: 18 U/L (ref 0–53)
AST: 17 U/L (ref 0–37)
Albumin: 4 g/dL (ref 3.5–5.2)
Alkaline Phosphatase: 84 U/L (ref 39–117)
BUN: 16 mg/dL (ref 6–23)
CO2: 29 mEq/L (ref 19–32)
Calcium: 9.9 mg/dL (ref 8.4–10.5)
Chloride: 104 mEq/L (ref 96–112)
Creatinine, Ser: 1.77 mg/dL — ABNORMAL HIGH (ref 0.40–1.50)
GFR: 37.18 mL/min — ABNORMAL LOW (ref >60.00)
Glucose, Bld: 92 mg/dL (ref 70–99)
Potassium: 4.4 mEq/L (ref 3.5–5.1)
Sodium: 140 mEq/L (ref 135–145)
Total Bilirubin: 0.8 mg/dL (ref 0.2–1.2)
Total Protein: 6.3 g/dL (ref 6.0–8.3)

## 2020-08-05 LAB — URIC ACID: Uric Acid, Serum: 8 mg/dL — ABNORMAL HIGH (ref 4.0–7.8)

## 2020-08-05 LAB — CBC
HCT: 42.2 % (ref 39.0–52.0)
Hemoglobin: 13.9 g/dL (ref 13.0–17.0)
MCHC: 33.1 g/dL (ref 30.0–36.0)
MCV: 94.3 fl (ref 78.0–100.0)
Platelets: 201 10*3/uL (ref 150.0–400.0)
RBC: 4.47 Mil/uL (ref 4.22–5.81)
RDW: 15.2 % (ref 11.5–15.5)
WBC: 6.8 10*3/uL (ref 4.0–10.5)

## 2020-08-05 LAB — LIPID PANEL
Cholesterol: 155 mg/dL (ref 0–200)
HDL: 38 mg/dL — ABNORMAL LOW (ref >39.00)
LDL Cholesterol: 93 mg/dL (ref 0–99)
NonHDL: 116.69
Total CHOL/HDL Ratio: 4
Triglycerides: 116 mg/dL (ref 0.0–149.0)
VLDL: 23.2 mg/dL (ref 0.0–40.0)

## 2020-08-05 LAB — PSA: PSA: 3.62 ng/mL (ref 0.10–4.00)

## 2020-08-05 LAB — HEMOGLOBIN A1C: Hgb A1c MFr Bld: 5.7 % (ref 4.6–6.5)

## 2020-08-05 LAB — TSH: TSH: 3.07 u[IU]/mL (ref 0.35–5.50)

## 2020-08-05 NOTE — Addendum Note (Signed)
Addended by: Manuela Schwartz on: 08/05/2020 09:21 AM   Modules accepted: Orders

## 2020-08-05 NOTE — Addendum Note (Signed)
Addended by: Manuela Schwartz on: 08/05/2020 09:22 AM   Modules accepted: Orders

## 2020-08-05 NOTE — Addendum Note (Signed)
Addended by: Manuela Schwartz on: 08/05/2020 09:19 AM   Modules accepted: Orders

## 2020-08-06 ENCOUNTER — Encounter: Payer: Self-pay | Admitting: Family Medicine

## 2020-08-12 ENCOUNTER — Encounter: Payer: Medicare Other | Admitting: Family Medicine

## 2020-08-16 ENCOUNTER — Other Ambulatory Visit: Payer: Self-pay | Admitting: Family Medicine

## 2020-08-20 ENCOUNTER — Telehealth: Payer: Self-pay | Admitting: Family Medicine

## 2020-08-20 NOTE — Telephone Encounter (Signed)
PT called back, he stated it was someone calling to schedule a colonoscopy and our number was the one that had called him. Informed him that I saw no note from anyone calling him. Gave him Dr. Steve Rattler number which is who I believe he was trying to call. He stated he would call them, and if it wasn't them he would wait for whoever called him to call him back.

## 2020-08-20 NOTE — Telephone Encounter (Signed)
Pt states he received a call from [place] this morning and he was returning the call. Pt would like a call back

## 2020-08-20 NOTE — Telephone Encounter (Signed)
I didn't call patient , did you guys call patient ?

## 2020-08-31 ENCOUNTER — Other Ambulatory Visit: Payer: Self-pay | Admitting: Cardiology

## 2020-09-02 ENCOUNTER — Ambulatory Visit (INDEPENDENT_AMBULATORY_CARE_PROVIDER_SITE_OTHER): Payer: Medicare Other | Admitting: Family Medicine

## 2020-09-02 ENCOUNTER — Other Ambulatory Visit: Payer: Self-pay

## 2020-09-02 ENCOUNTER — Ambulatory Visit (HOSPITAL_BASED_OUTPATIENT_CLINIC_OR_DEPARTMENT_OTHER)
Admission: RE | Admit: 2020-09-02 | Discharge: 2020-09-02 | Disposition: A | Discharge status: Home / Self Care | Payer: Medicare Other | Source: Ambulatory Visit | Attending: Family Medicine | Admitting: Family Medicine

## 2020-09-02 VITALS — BP 142/77 | HR 57 | Temp 97.8°F | Resp 16 | Wt 248.0 lb

## 2020-09-02 DIAGNOSIS — N1831 Chronic kidney disease, stage 3a: Secondary | ICD-10-CM

## 2020-09-02 DIAGNOSIS — Z0000 Encounter for general adult medical examination without abnormal findings: Secondary | ICD-10-CM

## 2020-09-02 DIAGNOSIS — Z1211 Encounter for screening for malignant neoplasm of colon: Secondary | ICD-10-CM

## 2020-09-02 DIAGNOSIS — Z7189 Other specified counseling: Secondary | ICD-10-CM | POA: Diagnosis not present

## 2020-09-02 DIAGNOSIS — E782 Mixed hyperlipidemia: Secondary | ICD-10-CM

## 2020-09-02 DIAGNOSIS — M170 Bilateral primary osteoarthritis of knee: Secondary | ICD-10-CM | POA: Diagnosis present

## 2020-09-02 DIAGNOSIS — I10 Essential (primary) hypertension: Secondary | ICD-10-CM

## 2020-09-02 DIAGNOSIS — M2550 Pain in unspecified joint: Secondary | ICD-10-CM

## 2020-09-02 DIAGNOSIS — R739 Hyperglycemia, unspecified: Secondary | ICD-10-CM | POA: Diagnosis not present

## 2020-09-02 DIAGNOSIS — F418 Other specified anxiety disorders: Secondary | ICD-10-CM

## 2020-09-02 DIAGNOSIS — M545 Low back pain, unspecified: Secondary | ICD-10-CM | POA: Insufficient documentation

## 2020-09-02 DIAGNOSIS — M109 Gout, unspecified: Secondary | ICD-10-CM

## 2020-09-02 DIAGNOSIS — E6609 Other obesity due to excess calories: Secondary | ICD-10-CM

## 2020-09-02 DIAGNOSIS — N289 Disorder of kidney and ureter, unspecified: Secondary | ICD-10-CM

## 2020-09-02 DIAGNOSIS — K219 Gastro-esophageal reflux disease without esophagitis: Secondary | ICD-10-CM

## 2020-09-02 MED ORDER — FLUOXETINE HCL 10 MG PO TABS: 10.0000 mg | ORAL_TABLET | Freq: Three times a day (TID) | ORAL | 1 refills | Status: DC

## 2020-09-02 NOTE — Assessment & Plan Note (Signed)
  Paxlovid is the new COVID medication we can give you if you get COVID so make sure you test if you have symptoms because we have to treat by day 5 of symptoms for it to be effective. If you are positive let us know so we can treat. If a home test is negative and your symptoms are persistent get a PCR test. Can check testing locations at [email or web address] If you are positive we will make an appointment with [place] and we will send in Paxlovid if you would like it. Check with your pharmacy before we meet to confirm they have it in stock, if they do not then we can get the prescription at the [facility]

## 2020-09-02 NOTE — Assessment & Plan Note (Signed)
Encourage heart healthy diet such as MIND or DASH diet, increase exercise, avoid trans fats, simple carbohydrates and processed foods, consider a krill or fish or flaxseed oil cap daily.  °

## 2020-09-02 NOTE — Assessment & Plan Note (Signed)
Hydrate and monitor 

## 2020-09-02 NOTE — Assessment & Plan Note (Signed)
Uses Omeprazole roughly 4 x a month prn and gets some relief a few hours later.

## 2020-09-02 NOTE — Assessment & Plan Note (Signed)
Encouraged DASH or MIND diet, decrease po intake and increase exercise as tolerated. Needs 7-8 hours of sleep nightly. Avoid trans fats, eat small, frequent meals every 4-5 hours with lean proteins, complex carbs and healthy fats. Minimize simple carbs, high fat foods and processed foods 

## 2020-09-02 NOTE — Assessment & Plan Note (Signed)
Stress is ongoing. He has just moved and is struggling with his poor health and his wife's poor health and their relationship is strained. Increase the Fluoxetine 10 mg three a day and consider

## 2020-09-02 NOTE — Assessment & Plan Note (Signed)
Well controlled, no changes to meds. Encouraged heart healthy diet such as the DASH diet and exercise as tolerated.  °

## 2020-09-02 NOTE — Patient Instructions (Addendum)
Mylanta for reflux.   Shingrix is the new shingles shot, 2 shots over 2-6 months, confirm coverage with insurance and document, then can return here for shots with nurse appt or at pharmacy   Paxlovid is the new COVID medication we can give you if you get COVID so make sure you test if you have symptoms because we have to treat by day 5 of symptoms for it to be effective. If you are positive let us know so we can treat. If a home test is negative and your symptoms are persistent get a PCR test. Can check testing locations at [email or web address] If you are positive we will make an appointment with [place] and we will send in Paxlovid if you would like it. Check with your pharmacy before we meet to confirm they have it in stock, if they do not then we can get the prescription at the [place] 65 Years and Older, Male Preventive care refers to lifestyle choices and visits with your health care provider that can promote health and wellness. This includes: A yearly physical exam. This is also called an annual wellness visit. Regular dental and eye exams. Immunizations. Screening for certain conditions. Healthy lifestyle choices, such as: Eating a healthy diet. Getting regular exercise. Not using drugs or products that contain nicotine and tobacco. Limiting alcohol use. What can I expect for my preventive care visit? Physical exam Your health care provider will check your: Height and weight. These may be used to calculate your BMI (body mass index). BMI is a measurement that tells if you are at a healthy weight. Heart rate and blood pressure. Body temperature. Skin for abnormal spots. Counseling Your health care provider may ask you questions about your: Past medical problems. Family's medical history. Alcohol, tobacco, and drug use. Emotional well-being. Home life and relationship well-being. Sexual activity. Diet, exercise, and sleep habits. History of  falls. Memory and ability to understand (cognition). Work and work Statistician. Access to firearms. What immunizations do I need?  Vaccines are usually given at various ages, according to a schedule. Your health care provider will recommend vaccines for you based on your age, medicalhistory, and lifestyle or other factors, such as travel or where you work. What tests do I need? Blood tests Lipid and cholesterol levels. These may be checked every 5 years, or more often depending on your overall health. Hepatitis C test. Hepatitis B test. Screening Lung cancer screening. You may have this screening every year starting at age 58 if you have a 30-pack-year history of smoking and currently smoke or have quit within the past 15 years. Colorectal cancer screening. All adults should have this screening starting at age 43 and continuing until age 55. Your health care provider may recommend screening at age 25 if you are at increased risk. You will have tests every 1-10 years, depending on your results and the type of screening test. Prostate cancer screening. Recommendations will vary depending on your family history and other risks. Genital exam to check for testicular cancer or hernias. Diabetes screening. This is done by checking your blood sugar (glucose) after you have not eaten for a while (fasting). You may have this done every 1-3 years. Abdominal aortic aneurysm (AAA) screening. You may need this if you are a current or former smoker. STD (sexually transmitted disease) testing, if you are at risk. Follow these instructions at home: Eating and drinking  Eat a diet that includes fresh fruits and vegetables,  whole grains, lean protein, and low-fat dairy products. Limit your intake of foods with high amounts of sugar, saturated fats, and salt. Take vitamin and mineral supplements as recommended by your health care provider. Do not drink alcohol if your health care provider tells you not to  drink. If you drink alcohol: Limit how much you have to 0-2 drinks a day. Be aware of how much alcohol is in your drink. In the [place] one drink equals one 12 oz bottle of beer (355 mL), one 5 oz glass of wine (148 mL), or one 1 oz glass of hard liquor (44 mL).  Lifestyle Take daily care of your teeth and gums. Brush your teeth every morning and night with fluoride toothpaste. Floss one time each day. Stay active. Exercise for at least 30 minutes 5 or more days each week. Do not use any products that contain nicotine or tobacco, such as cigarettes, e-cigarettes, and chewing tobacco. If you need help quitting, ask your health care provider. Do not use drugs. If you are sexually active, practice safe sex. Use a condom or other form of protection to prevent STIs (sexually transmitted infections). Talk with your health care provider about taking a low-dose aspirin or statin. Find healthy ways to cope with stress, such as: Meditation, yoga, or listening to music. Journaling. Talking to a trusted person. Spending time with friends and family. Safety Always wear your seat belt while driving or riding in a vehicle. Do not drive: If you have been drinking alcohol. Do not ride with someone who has been drinking. When you are tired or distracted. While texting. Wear a helmet and other protective equipment during sports activities. If you have firearms in your house, make sure you follow all gun safety procedures. What's next? Visit your health care provider once a year for an annual wellness visit. Ask your health care provider how often you should have your eyes and teeth checked. Stay up to date on all vaccines. This information is not intended to replace advice given to you by your health care provider. Make sure you discuss any questions you have with your healthcare provider. Document Revised: 09/27/2018 Document Reviewed: 12/23/2017 Elsevier Patient Education  2022 [employer].

## 2020-09-02 NOTE — Assessment & Plan Note (Signed)
hgba1c acceptable, minimize simple carbs. Increase exercise as tolerated.  

## 2020-09-02 NOTE — Progress Notes (Signed)
Subjective:   By signing my name below, I, Zite Okoli, attest that this documentation has been prepared under the direction and in the presence of Mosie Lukes, MD. 09/02/2020   Patient ID: David [name], male    DOB: Oct 06, 1945, 75 y.o.   MRN: [id number]  Chief Complaint  Patient presents with   Annual Exam         HPI Patient is in today for a comprehensive physical exam.  He recently moved 2 months ago which has been stressful. He has been having some problems at home which cause him more stress and reports feeling depressed. He reports that he has been sleeping more for about 12 hours.  He  feels a depression in the center of his right knee that hurts when palpated. He mentions that a he dropped a couch on his right knee 10 years ago. He describes the pain as sharp and it also occurs in his left ankle and right elbow. He also experiences intermittent right hip pain that radiates down to his right leg. He does not use any OTC medications to manage the pain and lets it resolve on its own.  He has noticed more frequent bouts of indigestion and manages it with 20 mg omeprazole PO prn. He thinks it is due to the kidney stone surgery he had.  He reports experiencing lower back pain after sitting down for a while that resolves when he walks around for a while.  He was admitted into the hospital years ago for a suspected heart attack and was told he might have a spastic diaphragm. The pain is not as intense as it was and drinking cold water and stretching have helped to manage the chest pain. He is due to see the cardiologist in 2 weeks.  He denies fever, congestion, eye pain, chest pain, palpitations, leg swelling, shortness of breath, nausea, abdominal pain, diarrhea and blood in stool. Also denies dysuria, frequency, and headaches.   He has been trying to engage in regular exercise by walking for 2 miles but is cautious about the weather and has reduced the length of his walk. He  is motivated to walk when his son and son-in-law come to visit.  He has 5 Pfizer Covid-19 vaccines at this time. He is willing to get the shingles vaccines. He is UTD on pneumonia and tetanus vaccines.  There has been no recent changes in family history. His niece died 4 years ago of Non-Hodgkin's lymphoma.  Past Medical History:  Diagnosis Date   Adenoma of right adrenal gland 07/11/2002   2.4cm , noted on CT ABD   Allergy    Anxiety    Arthritis of both knees 03/26/2016   Astigmatism    Back pain    BCC (basal cell carcinoma of skin)    under right eye and right ear   Blood transfusion without reported diagnosis    Cataract 09/29/2016   Depression    Diverticulitis 2009   Diverticulosis    Family history of breast cancer    Family history of colon cancer    Fatty liver    GERD (gastroesophageal reflux disease)    Gout    Hearing loss of both ears 03/26/2016   History of atrial fibrillation    x 2 none since 2020   History of chronic prostatitis    started at age 74   History of kidney stones    Hyperglycemia    Hyperlipidemia    Hypertension  Incomplete right bundle branch block (RBBB)    Internal hemorrhoids    Kidney lesion 06/07/2015   Right midportion, 1.1x1.1 cm hyperechoic, noted on [place] ABD   LAFB (left anterior fascicular block)    Left anterior fascicular block 08/06/2017   Noted on EKG   Lipoma of axilla 09/2016   Liver lesion, right lobe 06/07/2015   2.5x2.4x2.4 cm hypoechoic lesion posterior aspect, noted on [place] ABD   Low back pain 03/26/2016   LVH (left ventricular hypertrophy) 08/06/2017   Moderate, Noted on ECHO   Medicare annual wellness visit, subsequent 03/12/2014   Muscle cramps    Nocturia    OA (osteoarthritis)    Back, Hands, Neck   Obesity 11/25/2007   Qualifier: Diagnosis of  By: Lenna Gilford MD, Deborra Medina    Other malaise and fatigue 03/13/2013   Penis symptom or sign    PENIS sensitive under urethra and itches   Premature ventricular complex     Prostate cancer ([place]) dx 2021   Renal insufficiency    Kidney removed right   Scoliosis    Upper thoracic and lumbar   Sinus bradycardia 08/06/2017   Noted on EKG   Vitamin D deficiency    resolved    Past Surgical History:  Procedure Laterality Date   CARDIOVERSION  07/31/2017   CATARACT EXTRACTION, BILATERAL     COLON SURGERY  2009   segmental sigmoid resection   COLONOSCOPY     CYSTOSCOPY N/A 07/28/2019   Procedure: CYSTOSCOPY FLEXIBLE;  Surgeon: Cleon Gustin, MD;  Location: [facility];  Service: Urology;  Laterality: N/A;   CYSTOSCOPY W/ RETROGRADES Right 06/07/2018   Procedure: CYSTOSCOPY WITH RETROGRADE PYELOGRAM, uphrostogram;  Surgeon: Cleon Gustin, MD;  Location: [facility];  Service: Urology;  Laterality: Right;   CYSTOSCOPY WITH URETEROSCOPY AND STENT PLACEMENT Right 02/28/2018   Procedure: CYSTOSCOPY WITH URETEROSCOPY Concha Se;  Surgeon: Kathie Rhodes, MD;  Location: [facility];  Service: Urology;  Laterality: Right;   CYSTOSCOPY/URETEROSCOPY/HOLMIUM LASER/STENT PLACEMENT Right 11/29/2017   Procedure: CYSTOSCOPY/RETROGRADE/URETEROSCOPY/HOLMIUM LASER/STENT PLACEMENT;  Surgeon: Kathie Rhodes, MD;  Location: [facility];  Service: Urology;  Laterality: Right;   EYE SURGERY Bilateral 01/12/2017   cataract removal   history of blood tranfusion  age 39   HOLMIUM LASER APPLICATION Right [id number]   Procedure: HOLMIUM LASER APPLICATION;  Surgeon: Kathie Rhodes, MD;  Location: [facility];  Service: Urology;  Laterality: Right;   IR BALLOON DILATION URETERAL STRICTURE RIGHT  03/14/2018   IR NEPHRO TUBE REMOV/FL  03/17/2018   IR NEPHROSTOGRAM RIGHT THRU EXISTING ACCESS  03/17/2018   IR NEPHROSTOMY EXCHANGE RIGHT  03/14/2018   IR NEPHROSTOMY EXCHANGE RIGHT  06/21/2018   IR NEPHROSTOMY PLACEMENT RIGHT  03/02/2018   IR NEPHROSTOMY PLACEMENT RIGHT  04/20/2018   IR URETERAL STENT PLACEMENT EXISTING ACCESS RIGHT  03/14/2018   NOSE SURGERY      Submucous resection age 4   NUCLEAR STRESS TEST  06/03/2009   RADIOACTIVE SEED IMPLANT N/A 07/28/2019   Procedure: RADIOACTIVE SEED IMPLANT/BRACHYTHERAPY IMPLANT;  Surgeon: Cleon Gustin, MD;  Location: [facility];  Service: Urology;  Laterality: N/A;  90 MINS   ROBOT ASSISTED LAPAROSCOPIC NEPHRECTOMY Right 08/04/2018   Procedure: XI ROBOTIC ASSISTED LAPAROSCOPIC NEPHRECTOMY;  Surgeon: Cleon Gustin, MD;  Location: [facility];  Service: Urology;  Laterality: Right;  3 HRS   ROBOT ASSISTED PYELOPLASTY Right 06/07/2018   Procedure: attempted XI ROBOTIC ASSISTED PYELOPLASTY, lysis of adhesions;  Surgeon: Alyson Ingles,  Candee Furbish, MD;  Location: [facility];  Service: Urology;  Laterality: Right;  3 HRS   SKIN BIOPSY     SPACE OAR INSTILLATION N/A 07/28/2019   Procedure: SPACE OAR INSTILLATION;  Surgeon: Cleon Gustin, MD;  Location[facility];  Service: Urology;  Laterality: N/A;   URETEROSCOPY WITH HOLMIUM LASER LITHOTRIPSY Right 09/24/2017   Procedure: CYSTOSCOPY, URETEROSCOPY WITH HOLMIUM LASER LITHOTRIPSY, STENT PLACEMENT;  Surgeon: Kathie Rhodes, MD;  Location: [facility];  Service: Urology;  Laterality: Right;    Family History  Problem Relation Age of Onset   Heart disease Mother    Hypertension Mother    Stroke Mother    Colon cancer Mother 4   Breast cancer Mother 43   Heart disease Father        pacemaker   Aortic aneurysm Father    Hypertension Father    Lung cancer Father 14   Heart disease Sister    Atrial fibrillation Sister    Obesity Sister    Sleep apnea Sister    Heart attack Brother    Other Brother        muscle disease   Arthritis Brother    Stroke Brother    Atrial fibrillation Brother    Heart attack Maternal Grandmother    Diabetes Maternal Grandmother    Cancer Maternal Grandfather 49       hodgin's lymphoma   Heart attack Paternal Grandmother    Anxiety disorder Paternal Grandmother    Pneumonia  Paternal Grandfather    Heart attack Brother    Atrial fibrillation Brother    Atrial fibrillation Brother    Heart attack Brother    Hypertension Brother    Hyperlipidemia Brother    Heart attack Brother    Other Brother        heart valve operation   Atrial fibrillation Brother    Lymphoma Niece 76   Liver cancer Nephew 2       great nephew; d. 7   Esophageal cancer Neg Hx    Rectal cancer Neg Hx    Stomach cancer Neg Hx     Social History   Socioeconomic History   Marital status: Married    Spouse name: Not on file   Number of children: 3   Years of education: Not on file   Highest education level: Not on file  Occupational History   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[employer]    Employer: [employer]  Tobacco Use   Smoking status: Former    Packs/day: 1.00    Years: 6.00    Pack years: 6.00    Types: Cigarettes    Start date: 01/11/1969    Quit date: 01/12/1969    Years since quitting: 51.6   Smokeless tobacco: Never   Tobacco comments:    1965-1971  Vaping Use   Vaping Use: Never used  Substance and Sexual Activity   Alcohol use: Yes    Comment: occ   Drug use: Never   Sexual activity: Yes  Other Topics Concern   Not on file  Social History Narrative   The patient is married for the second time, he has 3 sons.   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   2 alcoholic beverages most days.   1 caffeinated beverage daily   No drug use no current tobacco use he is a prior smoker   12/24/2016   Social Determinants of Health   Financial Resource Strain: Low Risk  Difficulty of Paying Living Expenses: Not very hard  Food Insecurity: No Food Insecurity   Worried About Running Out of Food in the Last Year: Never true   Ran Out of Food in the Last Year: Never true  Transportation Needs: No Transportation Needs   Lack of Transportation (Medical): No   Lack of Transportation (Non-Medical): No  Physical Activity: Insufficiently Active   Days of Exercise per Week: 3 days   Minutes of  Exercise per Session: 30 min  Stress: No Stress Concern Present   Feeling of Stress : Only a little  Social Connections: Moderately Isolated   Frequency of Communication with Friends and Family: More than three times a week   Frequency of Social Gatherings with Friends and Family: Once a week   Attends Religious Services: Never   Marine scientist or Organizations: No   Attends Music therapist: Never   Marital Status: Married  Human resources officer Violence: Not At Risk   Fear of Current or Ex-Partner: No   Emotionally Abused: No   Physically Abused: No   Sexually Abused: No    Outpatient Medications Prior to Visit  Medication Sig Dispense Refill   apixaban (ELIQUIS) 5 MG TABS tablet TAKE ONE TABLET BY MOUTH TWICE A DAY 180 tablet 1   ascorbic acid (VITAMIN C) 1000 MG tablet Take by mouth.     atenolol (TENORMIN) 25 MG tablet TAKE ONE TABLET BY MOUTH EVERY MORNING, ONE TABLET AT NOON, AND ONE TABLET EVERY NIGHT AT BEDTIME 270 tablet 1   atorvastatin (LIPITOR) 10 MG tablet Take 1 tablet (10 mg total) by mouth in the morning. 90 tablet 1   clotrimazole-betamethasone (LOTRISONE) lotion Apply topically 2 (two) times daily. For 2 weeks. 30 mL 0   colchicine 0.6 MG tablet For gout flares, take 1 tab po once then repeat in 1 hour as needed if pain persists up to a total of 3 doses 15 tablet 0   LORazepam (ATIVAN) 1 MG tablet TAKE ONE TABLET BY MOUTH TWICE A DAY AS NEEDED FOR ANXIETY OR SEDATION 180 tablet 2   omeprazole (PRILOSEC) 20 MG capsule Take 20 mg by mouth daily as needed (acid reflux).      FLUoxetine (PROZAC) 10 MG tablet Take 1 tablet (10 mg total) by mouth 2 (two) times daily. 180 tablet 1   azelastine (ASTELIN) 0.1 % nasal spray Place 2 sprays into both nostrils 2 (two) times daily. 30 mL 6   fluconazole (DIFLUCAN) 150 MG tablet Take 1 tablet (150 mg total) by mouth once a week. 4 tablet 1   tamsulosin (FLOMAX) 0.4 MG CAPS capsule Take 0.4 mg by mouth daily.     No  facility-administered medications prior to visit.    Allergies  Allergen Reactions   Cefaclor Hives   Cephalosporins Hives   Penicillins Rash    Mild maculopapular rash Has patient had a PCN reaction causing immediate rash, facial/tongue/throat swelling, SOB or lightheadedness with hypotension: No Has patient had a PCN reaction causing severe rash involving mucus membranes or skin necrosis: No Has patient had a PCN reaction that required hospitalization: No Has patient had a PCN reaction occurring within the last 10 years: No If all of the above answers are "NO", then may proceed with Cephalosporin use.     Review of Systems  Constitutional:  Negative for fever.  HENT:  Negative for congestion.   Eyes:  Negative for pain.  Respiratory:  Negative for shortness of breath.  Cardiovascular:  Negative for chest pain, palpitations and leg swelling.  Gastrointestinal:  Negative for abdominal pain, blood in stool, diarrhea and nausea.  Genitourinary:  Negative for dysuria and frequency.  Musculoskeletal:  Positive for back pain (lumbar region) and joint pain (intermittent hip, knee, elbow).  Neurological:  Negative for headaches.  Psychiatric/Behavioral:  Positive for depression. Negative for suicidal ideas.       Objective:    Physical Exam Constitutional:      Appearance: Normal appearance. He is not ill-appearing.  HENT:     Head: Normocephalic and atraumatic.     Right Ear: Tympanic membrane, ear canal and external ear normal.     Left Ear: Tympanic membrane, ear canal and external ear normal.  Eyes:     Conjunctiva/sclera: Conjunctivae normal.     Comments: No nystagmus  Cardiovascular:     Rate and Rhythm: Normal rate and regular rhythm.     Heart sounds: Normal heart sounds. No murmur heard. Pulmonary:     Breath sounds: Normal breath sounds. No wheezing.  Abdominal:     General: Bowel sounds are normal. There is no distension.     Palpations: Abdomen is soft.      Tenderness: There is no abdominal tenderness.     Hernia: No hernia is present.  Musculoskeletal:     Cervical back: Neck supple.     Comments: 5/5 strength in upper and lower extremities  Lymphadenopathy:     Cervical: No cervical adenopathy.  Skin:    General: Skin is warm and dry.  Neurological:     Mental Status: He is alert and oriented to person, place, and time.     Deep Tendon Reflexes:     Reflex Scores:      Patellar reflexes are 1+ on the right side and 1+ on the left side. Psychiatric:        Behavior: Behavior normal.    BP (!) 142/77 (BP Location: Left Arm, Patient Position: Sitting, Cuff Size: Large)   Pulse (!) 57   Temp 97.8 F (36.6 C) (Oral)   Resp 16   Wt 248 lb (112.5 kg)   SpO2 99%   BMI 33.63 kg/m  Wt Readings from Last 3 Encounters:  09/02/20 248 lb (112.5 kg)  05/06/20 251 lb 4 oz (114 kg)  02/15/20 255 lb (115.7 kg)    Diabetic Foot Exam - Simple   No data filed    Lab Results  Component Value Date   WBC 6.8 08/05/2020   HGB 13.9 08/05/2020   HCT 42.2 08/05/2020   PLT 201.0 08/05/2020   GLUCOSE 92 08/05/2020   CHOL 155 08/05/2020   TRIG 116.0 08/05/2020   HDL 38.00 (L) 08/05/2020   LDLDIRECT 168.5 03/29/2006   LDLCALC 93 08/05/2020   ALT 18 08/05/2020   AST 17 08/05/2020   NA 140 08/05/2020   K 4.4 08/05/2020   CL 104 08/05/2020   CREATININE 1.77 (H) 08/05/2020   BUN 16 08/05/2020   CO2 29 08/05/2020   TSH 3.07 08/05/2020   PSA 3.62 08/05/2020   INR 1.0 07/25/2019   HGBA1C 5.7 08/05/2020    Lab Results  Component Value Date   TSH 3.07 08/05/2020   Lab Results  Component Value Date   WBC 6.8 08/05/2020   HGB 13.9 08/05/2020   HCT 42.2 08/05/2020   MCV 94.3 08/05/2020   PLT 201.0 08/05/2020   Lab Results  Component Value Date   NA 140 08/05/2020   K  4.4 08/05/2020   CO2 29 08/05/2020   GLUCOSE 92 08/05/2020   BUN 16 08/05/2020   CREATININE 1.77 (H) 08/05/2020   BILITOT 0.8 08/05/2020   ALKPHOS 84 08/05/2020    AST 17 08/05/2020   ALT 18 08/05/2020   PROT 6.3 08/05/2020   ALBUMIN 4.0 08/05/2020   CALCIUM 9.9 08/05/2020   ANIONGAP 10 08/20/2019   GFR 37.18 (L) 08/05/2020   Lab Results  Component Value Date   CHOL 155 08/05/2020   Lab Results  Component Value Date   HDL 38.00 (L) 08/05/2020   Lab Results  Component Value Date   LDLCALC 93 08/05/2020   Lab Results  Component Value Date   TRIG 116.0 08/05/2020   Lab Results  Component Value Date   CHOLHDL 4 08/05/2020   Lab Results  Component Value Date   HGBA1C 5.7 08/05/2020        Colonoscopy: Last completed on 09/09/2007. Results showed diverticulosis from the transverse colon to the sigmoid colon and internal hemorrhoids. Repeat in 10 years. Due Dexa: Not completed  PSA: Last completed on 08/05/2020. Measured at 3.62.  Assessment & Plan:   Problem List Items Addressed This Visit     Obesity    Encouraged DASH or MIND diet, decrease po intake and increase exercise as tolerated. Needs 7-8 hours of sleep nightly. Avoid trans fats, eat small, frequent meals every 4-5 hours with lean proteins, complex carbs and healthy fats. Minimize simple carbs, high fat foods and processed foods      GERD    Uses Omeprazole roughly 4 x a month prn and gets some relief a few hours later.       Hyperlipidemia, mixed    Encourage heart healthy diet such as MIND or DASH diet, increase exercise, avoid trans fats, simple carbohydrates and processed foods, consider a krill or fish or flaxseed oil cap daily.       Relevant Orders   Lipid panel   Essential hypertension, benign    Well controlled, no changes to meds. Encouraged heart healthy diet such as the DASH diet and exercise as tolerated.       Relevant Orders   CBC   Comprehensive metabolic panel   TSH   Arthralgia    He notes diffuse joint pains notes stiffness and some tenderness to touch at times but it comes and goes no redness, swelling or warmth. He notes his knees are  often the most notable R>L      Gout    Hydrate and monitor      Relevant Orders   Uric acid   Depression with anxiety    Stress is ongoing. He has just moved and is struggling with his poor health and his wife's poor health and their relationship is strained. Increase the Fluoxetine 10 mg three a day and consider       Relevant Medications   FLUoxetine (PROZAC) 10 MG tablet   Preventative health care    Patient encouraged to maintain heart healthy diet, regular exercise, adequate sleep. Consider daily probiotics. Take medications as prescribed. Labs ordered and reviewed. Due for colonoscopy referral placed      Low back pain    Encouraged moist heat and gentle stretching as tolerated. May try NSAIDs and prescription meds as directed and report if symptoms worsen or seek immediate care, check xray      Relevant Orders   DG Lumbar Spine 2-3 Views   Arthritis of both knees - Primary  R>l will check xray today.       Relevant Orders   DG Knee Complete 4 Views Right   Hyperglycemia    hgba1c acceptable, minimize simple carbs. Increase exercise as tolerated.       Relevant Orders   Hemoglobin A1c   Educated about COVID-19 virus infection     Paxlovid is the new COVID medication we can give you if you get COVID so make sure you test if you have symptoms because we have to treat by day 5 of symptoms for it to be effective. If you are positive let us know so we can treat. If a home test is negative and your symptoms are persistent get a PCR test. Can check testing locations at [email or web address] If you are positive we will make an appointment with [place] and we will send in Paxlovid if you would like it. Check with your pharmacy before we meet to confirm they have it in stock, if they do not then we can get the prescription at the [facility]       Stage 3a chronic kidney disease ([place])    Hydrate and monitor      RESOLVED: Renal insufficiency    Hydrate and  monitor      Other Visit Diagnoses     Colon cancer screening       Relevant Orders   Ambulatory referral to Gastroenterology       Meds ordered this encounter  Medications   FLUoxetine (PROZAC) 10 MG tablet    Sig: Take 1 tablet (10 mg total) by mouth 3 (three) times daily.    Dispense:  270 tablet    Refill:  1    I,Zite Okoli,acting as a scribe for Penni Homans, MD.,have documented all relevant documentation on the behalf of Penni Homans, MD,as directed by  Penni Homans, MD while in the presence of Penni Homans, MD.   I, Mosie Lukes, MD., personally preformed the services described in this documentation.  All medical record entries made by the scribe were at my direction and in my presence.  I have reviewed the chart and discharge instructions (if applicable) and agree that the record reflects my personal performance and is accurate and complete. 09/02/2020

## 2020-09-02 NOTE — Assessment & Plan Note (Signed)
He notes diffuse joint pains notes stiffness and some tenderness to touch at times but it comes and goes no redness, swelling or warmth. He notes his knees are often the most notable R>L

## 2020-09-02 NOTE — Assessment & Plan Note (Signed)
R>l will check xray today.

## 2020-09-02 NOTE — Telephone Encounter (Signed)
Prescription refill request for Eliquis received. Last office visit:hochrein 08/28/19 Scr:1.77 08/05/20 Age: [id number]

## 2020-09-02 NOTE — Progress Notes (Signed)
Note opened in error.

## 2020-09-02 NOTE — Assessment & Plan Note (Signed)
Encouraged moist heat and gentle stretching as tolerated. May try NSAIDs and prescription meds as directed and report if symptoms worsen or seek immediate care, check xray

## 2020-09-02 NOTE — Assessment & Plan Note (Signed)
Patient encouraged to maintain heart healthy diet, regular exercise, adequate sleep. Consider daily probiotics. Take medications as prescribed. Labs ordered and reviewed. Due for colonoscopy referral placed

## 2020-09-03 ENCOUNTER — Encounter: Payer: Self-pay | Admitting: Family Medicine

## 2020-09-04 ENCOUNTER — Other Ambulatory Visit: Payer: Self-pay | Admitting: Family Medicine

## 2020-09-04 DIAGNOSIS — M47817 Spondylosis without myelopathy or radiculopathy, lumbosacral region: Secondary | ICD-10-CM

## 2020-09-04 DIAGNOSIS — M545 Low back pain, unspecified: Secondary | ICD-10-CM

## 2020-09-04 DIAGNOSIS — G8929 Other chronic pain: Secondary | ICD-10-CM

## 2020-09-05 ENCOUNTER — Encounter: Payer: Self-pay | Admitting: Cardiology

## 2020-09-05 NOTE — Progress Notes (Signed)
So    Cardiology Office Note   Date:  09/06/2020   ID:  David, [name] 1945/03/13, MRN [id number]  PCP:  Mosie Lukes, MD  Cardiologist:   Minus Breeding, MD   Chief Complaint  Patient presents with   Atrial Fibrillation       History of Present Illness: David [name] is a 75 y.o. male who presents for follow up of atrial fib.  He has been seen in the Atrial Fib clinic.  He has had cardioversion.  He was in the ED last year with PAF.  Since then he has had no new cardiovascular complaints.  He thinks he would feel his atrial fibrillation and he has not.  He has not had any new urologic problems having had nephrectomy, prostate seed implants, kidney stones.  He has gained some weight because he was exercising during all his urologic issues.  However, he is starting to get back to this.  He walked 2 miles the other day.  He moved and had to carry boxes recently. The patient denies any new symptoms such as chest discomfort, neck or arm discomfort. There has been no new shortness of breath, PND or orthopnea. There have been no reported palpitations, presyncope or syncope.   Past Medical History:  Diagnosis Date   Adenoma of right adrenal gland 07/11/2002   2.4cm , noted on CT ABD   Allergy    Anxiety    Arthritis of both knees 03/26/2016   Astigmatism    Back pain    BCC (basal cell carcinoma of skin)    under right eye and right ear   Blood transfusion without reported diagnosis    Cataract 09/29/2016   Depression    Diverticulitis 2009   Diverticulosis    Family history of breast cancer    Family history of colon cancer    Fatty liver    GERD (gastroesophageal reflux disease)    Gout    Hearing loss of both ears 03/26/2016   History of atrial fibrillation    x 2 none since 2020   History of chronic prostatitis    started at age 47   History of kidney stones    Hyperglycemia    Hyperlipidemia    Hypertension    Incomplete right bundle branch block (RBBB)     Internal hemorrhoids    Kidney lesion 06/07/2015   Right midportion, 1.1x1.1 cm hyperechoic, noted on [place] ABD   LAFB (left anterior fascicular block)    Lipoma of axilla 09/2016   Liver lesion, right lobe 06/07/2015   2.5x2.4x2.4 cm hypoechoic lesion posterior aspect, noted on [place] ABD   Low back pain 03/26/2016   LVH (left ventricular hypertrophy) 08/06/2017   Moderate, Noted on ECHO   Medicare annual wellness visit, subsequent 03/12/2014   OA (osteoarthritis)    Back, Hands, Neck   Obesity 11/25/2007   Qualifier: Diagnosis of  By: Lenna Gilford MD, Deborra Medina    Other malaise and fatigue 03/13/2013   Premature ventricular complex    Prostate cancer ([place]) dx 2021   Renal insufficiency    Kidney removed right   Scoliosis    Upper thoracic and lumbar   Sinus bradycardia 08/06/2017   Noted on EKG   Vitamin D deficiency    resolved    Past Surgical History:  Procedure Laterality Date   CARDIOVERSION  07/31/2017   CATARACT EXTRACTION, BILATERAL     COLON SURGERY  2009   segmental sigmoid resection  COLONOSCOPY     CYSTOSCOPY N/A 07/28/2019   Procedure: CYSTOSCOPY FLEXIBLE;  Surgeon: Cleon Gustin, MD;  Location: [facility];  Service: Urology;  Laterality: N/A;   CYSTOSCOPY W/ RETROGRADES Right 06/07/2018   Procedure: CYSTOSCOPY WITH RETROGRADE PYELOGRAM, uphrostogram;  Surgeon: Cleon Gustin, MD;  Location: [facility];  Service: Urology;  Laterality: Right;   CYSTOSCOPY WITH URETEROSCOPY AND STENT PLACEMENT Right 02/28/2018   Procedure: CYSTOSCOPY WITH URETEROSCOPY [place];  Surgeon: Kathie Rhodes, MD;  Location: [facility];  Service: Urology;  Laterality: Right;   CYSTOSCOPY/URETEROSCOPY/HOLMIUM LASER/STENT PLACEMENT Right 11/29/2017   Procedure: CYSTOSCOPY/RETROGRADE/URETEROSCOPY/HOLMIUM LASER/STENT PLACEMENT;  Surgeon: Kathie Rhodes, MD;  Location: [facility];  Service: Urology;  Laterality: Right;   EYE SURGERY Bilateral 01/12/2017   cataract  removal   history of blood tranfusion  age 72   HOLMIUM LASER APPLICATION Right [id number]   Procedure: HOLMIUM LASER APPLICATION;  Surgeon: Kathie Rhodes, MD;  Location: [facility];  Service: Urology;  Laterality: Right;   IR BALLOON DILATION URETERAL STRICTURE RIGHT  03/14/2018   IR NEPHRO TUBE REMOV/FL  03/17/2018   IR NEPHROSTOGRAM RIGHT THRU EXISTING ACCESS  03/17/2018   IR NEPHROSTOMY EXCHANGE RIGHT  03/14/2018   IR NEPHROSTOMY EXCHANGE RIGHT  06/21/2018   IR NEPHROSTOMY PLACEMENT RIGHT  03/02/2018   IR NEPHROSTOMY PLACEMENT RIGHT  04/20/2018   IR URETERAL STENT PLACEMENT EXISTING ACCESS RIGHT  03/14/2018   NOSE SURGERY     Submucous resection age 55   NUCLEAR STRESS TEST  06/03/2009   RADIOACTIVE SEED IMPLANT N/A 07/28/2019   Procedure: RADIOACTIVE SEED IMPLANT/BRACHYTHERAPY IMPLANT;  Surgeon: Cleon Gustin, MD;  Location: [facility];  Service: Urology;  Laterality: N/A;  90 MINS   ROBOT ASSISTED LAPAROSCOPIC NEPHRECTOMY Right 08/04/2018   Procedure: XI ROBOTIC ASSISTED LAPAROSCOPIC NEPHRECTOMY;  Surgeon: Cleon Gustin, MD;  Location: [facility];  Service: Urology;  Laterality: Right;  3 HRS   ROBOT ASSISTED PYELOPLASTY Right 06/07/2018   Procedure: attempted XI ROBOTIC ASSISTED PYELOPLASTY, lysis of adhesions;  Surgeon: Cleon Gustin, MD;  Location: [facility];  Service: Urology;  Laterality: Right;  3 HRS   SKIN BIOPSY     SPACE OAR INSTILLATION N/A 07/28/2019   Procedure: SPACE OAR INSTILLATION;  Surgeon: Cleon Gustin, MD;  Location: [facility];  Service: Urology;  Laterality: N/A;   URETEROSCOPY WITH HOLMIUM LASER LITHOTRIPSY Right 09/24/2017   Procedure: CYSTOSCOPY, URETEROSCOPY WITH HOLMIUM LASER LITHOTRIPSY, STENT PLACEMENT;  Surgeon: Kathie Rhodes, MD;  Location: [facility];  Service: Urology;  Laterality: Right;     Current Outpatient Medications  Medication Sig Dispense Refill   apixaban (ELIQUIS) 5 MG TABS  tablet TAKE ONE TABLET BY MOUTH TWICE A DAY 180 tablet 1   ascorbic acid (VITAMIN C) 1000 MG tablet Take by mouth.     atenolol (TENORMIN) 25 MG tablet TAKE ONE TABLET BY MOUTH EVERY MORNING, ONE TABLET AT NOON, AND ONE TABLET EVERY NIGHT AT BEDTIME 270 tablet 1   atorvastatin (LIPITOR) 10 MG tablet Take 1 tablet (10 mg total) by mouth in the morning. 90 tablet 1   clotrimazole-betamethasone (LOTRISONE) lotion Apply topically 2 (two) times daily. For 2 weeks. 30 mL 0   colchicine 0.6 MG tablet For gout flares, take 1 tab po once then repeat in 1 hour as needed if pain persists up to a total of 3 doses 15 tablet 0   FLUoxetine (PROZAC) 10 MG tablet Take 1  tablet (10 mg total) by mouth 3 (three) times daily. 270 tablet 1   LORazepam (ATIVAN) 1 MG tablet TAKE ONE TABLET BY MOUTH TWICE A DAY AS NEEDED FOR ANXIETY OR SEDATION 180 tablet 2   omeprazole (PRILOSEC) 20 MG capsule Take 20 mg by mouth daily as needed (acid reflux).      No current facility-administered medications for this visit.    Allergies:   Cefaclor, Cephalosporins, and Penicillins    ROS:  Please see the history of present illness.   Otherwise, review of systems are positive for none.   All other systems are reviewed and negative.    PHYSICAL EXAM: VS:  BP (!) 159/76   Pulse (!) 59   Ht 6' (1.829 m)   Wt 249 lb 9.6 oz (113.2 kg)   SpO2 96%   BMI 33.85 kg/m  , BMI Body mass index is 33.85 kg/m. GENERAL:  Well appearing NECK:  No jugular venous distention, waveform within normal limits, carotid upstroke brisk and symmetric, no bruits, no thyromegaly LUNGS:  Clear to auscultation bilaterally CHEST:  Unremarkable HEART:  PMI not displaced or sustained,S1 and S2 within normal limits, no S3, no S4, no clicks, no rubs, no murmurs ABD:  Flat, positive bowel sounds normal in frequency in pitch, no bruits, no rebound, no guarding, no midline pulsatile mass, no hepatomegaly, no splenomegaly EXT:  2 plus pulses throughout, no edema,  no cyanosis no clubbing   EKG:  EKG is  ordered today. Sinus rhythm, rate 59, left axis deviation, poor anterior R wave progression, no acute ST-T wave changes.  Recent Labs: 08/05/2020: ALT 18; BUN 16; Creatinine, Ser 1.77; Hemoglobin 13.9; Platelets 201.0; Potassium 4.4; Sodium 140; TSH 3.07    Lipid Panel    Component Value Date/Time   CHOL 155 08/05/2020 0909   TRIG 116.0 08/05/2020 0909   HDL 38.00 (L) 08/05/2020 0909   CHOLHDL 4 08/05/2020 0909   VLDL 23.2 08/05/2020 0909   LDLCALC 93 08/05/2020 0909   LDLCALC 179 (H) 07/01/2018 1446   LDLDIRECT 168.5 03/29/2006 1007      Wt Readings from Last 3 Encounters:  09/06/20 249 lb 9.6 oz (113.2 kg)  09/02/20 248 lb (112.5 kg)  05/06/20 251 lb 4 oz (114 kg)      Other studies Reviewed: Additional studies/ records that were reviewed today include: Labs Review of the above records demonstrates:  See elsewhere   ASSESSMENT AND PLAN:  ATRIAL FIB:    Mr. WESSLEY MONTESI has a CHA2DS2 - VASc score of 2.    He has had no symptomatic recurrence.  He tolerates anticoagulation.  No change in therapy.  He is up-to-date with blood counts.   LVH:   This was moderate on echo in 2019 and I will follow this up.   HTN: Blood pressure is elevated today but he says it is typically in the 130/80 range at home.  He is going to keep an eye on this.   CKD IIIA: His creatinine is down to 1.77 which is stable.   OBESITY:    We discussed weight loss with diet and exercise.  Current medicines are reviewed at length with the patient today.  The patient does not have concerns regarding medicines.  The following changes have been made: None  Labs/ tests ordered today include:   Orders Placed This Encounter  Procedures   EKG 12-Lead   ECHOCARDIOGRAM COMPLETE      Disposition:   FU with me in 12  months.     Signed, Minus Breeding, MD  09/06/2020 11:41 AM    [facility]

## 2020-09-06 ENCOUNTER — Encounter: Payer: Self-pay | Admitting: Cardiology

## 2020-09-06 ENCOUNTER — Other Ambulatory Visit: Payer: Self-pay

## 2020-09-06 ENCOUNTER — Ambulatory Visit: Payer: Medicare Other | Admitting: Cardiology

## 2020-09-06 VITALS — BP 159/76 | HR 59 | Ht 72.0 in | Wt 249.6 lb

## 2020-09-06 DIAGNOSIS — I517 Cardiomegaly: Secondary | ICD-10-CM

## 2020-09-06 DIAGNOSIS — N183 Chronic kidney disease, stage 3 unspecified: Secondary | ICD-10-CM | POA: Diagnosis not present

## 2020-09-06 DIAGNOSIS — I480 Paroxysmal atrial fibrillation: Secondary | ICD-10-CM | POA: Diagnosis not present

## 2020-09-06 DIAGNOSIS — I10 Essential (primary) hypertension: Secondary | ICD-10-CM | POA: Diagnosis not present

## 2020-09-06 NOTE — Patient Instructions (Signed)
Medication Instructions:  Your physician recommends that you continue on your current medications as directed. Please refer to the Current Medication list given to you today.  *If you need a refill on your cardiac medications before your next appointment, please call your pharmacy*   Testing/Procedures: Your physician has requested that you have an echocardiogram. Echocardiography is a painless test that uses sound waves to create images of your heart. It provides your doctor with information about the size and shape of your heart and how well your heart's chambers and valves are working. This procedure takes approximately one hour. There are no restrictions for this procedure. -- [address]   Follow-Up: At [facility], you and your health needs are our priority.  As part of our continuing mission to provide you with exceptional heart care, we have created designated Provider Care Teams.  These Care Teams include your primary Cardiologist (physician) and Advanced Practice Providers (APPs -  Physician Assistants and Nurse Practitioners) who all work together to provide you with the care you need, when you need it.  We recommend signing up for the patient portal called "MyChart".  Sign up information is provided on this After Visit Summary.  MyChart is used to connect with patients for Virtual Visits (Telemedicine).  Patients are able to view lab/test results, encounter notes, upcoming appointments, etc.  Non-urgent messages can be sent to your provider as well.   To learn more about what you can do with MyChart, go to [email or web address].    Your next appointment:   12 month(s)  The format for your next appointment:   In Person  Provider:   You may see Minus Breeding, MD or one of the following Advanced Practice Providers on your designated Care Team:   Rosaria Ferries, PA-C Caron Presume, PA-C Jory Sims, DNP, ANP   Other Instructions

## 2020-09-15 ENCOUNTER — Encounter (HOSPITAL_BASED_OUTPATIENT_CLINIC_OR_DEPARTMENT_OTHER): Payer: Self-pay | Admitting: Emergency Medicine

## 2020-09-15 ENCOUNTER — Emergency Department (HOSPITAL_BASED_OUTPATIENT_CLINIC_OR_DEPARTMENT_OTHER)
Admission: EM | Admit: 2020-09-15 | Discharge: 2020-09-15 | Disposition: A | Discharge status: Home / Self Care | Payer: Medicare Other | Attending: Emergency Medicine | Admitting: Emergency Medicine

## 2020-09-15 ENCOUNTER — Other Ambulatory Visit: Payer: Self-pay

## 2020-09-15 DIAGNOSIS — Z7901 Long term (current) use of anticoagulants: Secondary | ICD-10-CM | POA: Diagnosis not present

## 2020-09-15 DIAGNOSIS — Z87891 Personal history of nicotine dependence: Secondary | ICD-10-CM | POA: Diagnosis not present

## 2020-09-15 DIAGNOSIS — Z85828 Personal history of other malignant neoplasm of skin: Secondary | ICD-10-CM | POA: Insufficient documentation

## 2020-09-15 DIAGNOSIS — Z79899 Other long term (current) drug therapy: Secondary | ICD-10-CM | POA: Insufficient documentation

## 2020-09-15 DIAGNOSIS — Z8546 Personal history of malignant neoplasm of prostate: Secondary | ICD-10-CM | POA: Diagnosis not present

## 2020-09-15 DIAGNOSIS — I129 Hypertensive chronic kidney disease with stage 1 through stage 4 chronic kidney disease, or unspecified chronic kidney disease: Secondary | ICD-10-CM | POA: Diagnosis not present

## 2020-09-15 DIAGNOSIS — I10 Essential (primary) hypertension: Secondary | ICD-10-CM

## 2020-09-15 DIAGNOSIS — Z86018 Personal history of other benign neoplasm: Secondary | ICD-10-CM | POA: Diagnosis not present

## 2020-09-15 DIAGNOSIS — R519 Headache, unspecified: Secondary | ICD-10-CM | POA: Diagnosis present

## 2020-09-15 DIAGNOSIS — N1831 Chronic kidney disease, stage 3a: Secondary | ICD-10-CM | POA: Insufficient documentation

## 2020-09-15 NOTE — ED Provider Notes (Signed)
[facility] Provider Note   CSN: [id number] Arrival date & time: 09/15/20  1221     History Chief Complaint  Patient presents with   Hypertension    David [name] is a 75 y.o. male.  DeniesPatient is noted that his blood pressures been elevated for the past few days.  Is any chest pain trouble breathing.  He has had a little bit of a slight headache but nothing severe.  Nursing reported unsteady on feet but patient said it is nothing to do with the blood pressure being elevated that has been going on for months.  And a known factor and he just has to be a little careful when he walks.  Patient is on atenolol 3 times a day 25 mg for his blood pressure.  He is followed by Daron Offer is his primary care doctor.  Patient has the ability to check his blood pressures at home.  Has not been keeping a logbook.  Was seen by his primary care doctor about a week ago and blood pressure was fine at that time.      Past Medical History:  Diagnosis Date   Adenoma of right adrenal gland 07/11/2002   2.4cm , noted on CT ABD   Allergy    Anxiety    Arthritis of both knees 03/26/2016   Astigmatism    Back pain    BCC (basal cell carcinoma of skin)    under right eye and right ear   Blood transfusion without reported diagnosis    Cataract 09/29/2016   Depression    Diverticulitis 2009   Diverticulosis    Family history of breast cancer    Family history of colon cancer    Fatty liver    GERD (gastroesophageal reflux disease)    Gout    Hearing loss of both ears 03/26/2016   History of atrial fibrillation    x 2 none since 2020   History of chronic prostatitis    started at age 18   History of kidney stones    Hyperglycemia    Hyperlipidemia    Hypertension    Incomplete right bundle branch block (RBBB)    Internal hemorrhoids    Kidney lesion 06/07/2015   Right midportion, 1.1x1.1 cm hyperechoic, noted on [place] ABD   LAFB (left anterior fascicular  block)    Lipoma of axilla 09/2016   Liver lesion, right lobe 06/07/2015   2.5x2.4x2.4 cm hypoechoic lesion posterior aspect, noted on [place] ABD   Low back pain 03/26/2016   LVH (left ventricular hypertrophy) 08/06/2017   Moderate, Noted on ECHO   Medicare annual wellness visit, subsequent 03/12/2014   OA (osteoarthritis)    Back, Hands, Neck   Obesity 11/25/2007   Qualifier: Diagnosis of  By: Lenna Gilford MD, Deborra Medina    Other malaise and fatigue 03/13/2013   Premature ventricular complex    Prostate cancer ([place]) dx 2021   Renal insufficiency    Kidney removed right   Scoliosis    Upper thoracic and lumbar   Sinus bradycardia 08/06/2017   Noted on EKG   Vitamin D deficiency    resolved    Patient Active Problem List   Diagnosis Date Noted   Stage 3a chronic kidney disease ([place]) 07/02/2020   Urinary frequency 02/12/2020   Genetic testing 09/26/2019   Family history of breast cancer    Family history of colon cancer    Educated about COVID-19 virus infection 08/26/2019  Malignant neoplasm of prostate ([place]) 04/28/2019   Pruritus 02/03/2019   LVH (left ventricular hypertrophy) 10/30/2018   Tinea versicolor 09/28/2018   Thoracic back pain 09/26/2018   Hypercalcemia 08/08/2018   Nonfunctioning kidney 08/04/2018   Arm pain 07/18/2018   Right hip pain 06/22/2018   Ureteral obstruction 06/07/2018   Hydronephrosis with ureteropelvic junction (UPJ) obstruction 04/23/2018   Recurrent nephrolithiasis 04/23/2018   Hydronephrosis 03/01/2018   Floppy eyelid syndrome of both eyes 01/24/2018   Foot pain, bilateral 12/28/2017   Kidney stone 09/18/2017   Insomnia 09/17/2017   Abnormal TSH 04/15/2017   Senile nuclear sclerosis 01/18/2017   Cataract 09/29/2016   Muscle cramp 09/29/2016   Nocturia 09/29/2016   Hyperglycemia 03/29/2016   Low back pain 03/26/2016   Cataracts, bilateral 03/26/2016   Arthritis of both knees 03/26/2016   Hearing loss 03/26/2016   Complex renal cyst 06/16/2015    History of atrial fibrillation 06/16/2015   Snoring 06/07/2015   Somnolence 06/07/2015   Preventative health care 03/07/2015   Lipoma of axilla 06/04/2014   Tinea cruris [id number]   Lichen simplex chronicus 03/20/2014   Medicare annual wellness visit, subsequent 03/12/2014   Depression with anxiety 09/25/2013   Constipation 09/25/2013   Gouty arthritis of toe of left foot 08/01/2013   Skin lesion 05/27/2013   Rash 05/26/2013   Abnormal liver function 03/18/2013   IBS (irritable bowel syndrome) 03/18/2013   Other malaise and fatigue 03/13/2013   Tinea corporis 03/13/2013   Gout 12/14/2012   Diarrhea 12/14/2012   Rectal irritation 09/18/2012   Diverticulosis    BCC (basal cell carcinoma of skin)    Scoliosis 01/19/2011   Hyperlipidemia, mixed 03/11/2010   Essential hypertension, benign 03/11/2010   ALLERGIC RHINITIS 03/11/2010   Arthralgia 03/11/2010   Obesity 11/25/2007   BENIGN NEOPLASM OF ADRENAL GLAND 09/22/2007   Fatty liver disease, nonalcoholic 0000000   Osteoarthritis 09/22/2007   SPONDYLOSIS, LUMBAR 09/22/2007   GERD 04/12/2007    Past Surgical History:  Procedure Laterality Date   CARDIOVERSION  07/31/2017   CATARACT EXTRACTION, BILATERAL     COLON SURGERY  2009   segmental sigmoid resection   COLONOSCOPY     CYSTOSCOPY N/A 07/28/2019   Procedure: CYSTOSCOPY FLEXIBLE;  Surgeon: Cleon Gustin, MD;  Location[facility];  Service: Urology;  Laterality: N/A;   CYSTOSCOPY W/ RETROGRADES Right 06/07/2018   Procedure: CYSTOSCOPY WITH RETROGRADE PYELOGRAM, uphrostogram;  Surgeon: Cleon Gustin, MD;  Location: [facility];  Service: Urology;  Laterality: Right;   CYSTOSCOPY WITH URETEROSCOPY AND STENT PLACEMENT Right 02/28/2018   Procedure: CYSTOSCOPY WITH URETEROSCOPY [place];  Surgeon: Kathie Rhodes, MD;  Location: [facility];  Service: Urology;  Laterality: Right;   CYSTOSCOPY/URETEROSCOPY/HOLMIUM LASER/STENT PLACEMENT  Right 11/29/2017   Procedure: CYSTOSCOPY/RETROGRADE/URETEROSCOPY/HOLMIUM LASER/STENT PLACEMENT;  Surgeon: Kathie Rhodes, MD;  Location: [facility];  Service: Urology;  Laterality: Right;   EYE SURGERY Bilateral 01/12/2017   cataract removal   history of blood tranfusion  age 40   HOLMIUM LASER APPLICATION Right [id number]   Procedure: HOLMIUM LASER APPLICATION;  Surgeon: Kathie Rhodes, MD;  Location: [facility];  Service: Urology;  Laterality: Right;   IR BALLOON DILATION URETERAL STRICTURE RIGHT  03/14/2018   IR NEPHRO TUBE REMOV/FL  03/17/2018   IR NEPHROSTOGRAM RIGHT THRU EXISTING ACCESS  03/17/2018   IR NEPHROSTOMY EXCHANGE RIGHT  03/14/2018   IR NEPHROSTOMY EXCHANGE RIGHT  06/21/2018   IR NEPHROSTOMY PLACEMENT RIGHT  03/02/2018   IR NEPHROSTOMY  PLACEMENT RIGHT  04/20/2018   IR URETERAL STENT PLACEMENT EXISTING ACCESS RIGHT  03/14/2018   NOSE SURGERY     Submucous resection age 32   NUCLEAR STRESS TEST  06/03/2009   RADIOACTIVE SEED IMPLANT N/A 07/28/2019   Procedure: RADIOACTIVE SEED IMPLANT/BRACHYTHERAPY IMPLANT;  Surgeon: Cleon Gustin, MD;  Location: [facility];  Service: Urology;  Laterality: N/A;  90 MINS   ROBOT ASSISTED LAPAROSCOPIC NEPHRECTOMY Right 08/04/2018   Procedure: XI ROBOTIC ASSISTED LAPAROSCOPIC NEPHRECTOMY;  Surgeon: Cleon Gustin, MD;  Location: [facility];  Service: Urology;  Laterality: Right;  3 HRS   ROBOT ASSISTED PYELOPLASTY Right 06/07/2018   Procedure: attempted XI ROBOTIC ASSISTED PYELOPLASTY, lysis of adhesions;  Surgeon: Cleon Gustin, MD;  Location: [facility];  Service: Urology;  Laterality: Right;  3 HRS   SKIN BIOPSY     SPACE OAR INSTILLATION N/A 07/28/2019   Procedure: SPACE OAR INSTILLATION;  Surgeon: Cleon Gustin, MD;  Location: [facility];  Service: Urology;  Laterality: N/A;   URETEROSCOPY WITH HOLMIUM LASER LITHOTRIPSY Right 09/24/2017   Procedure: CYSTOSCOPY, URETEROSCOPY WITH HOLMIUM LASER  LITHOTRIPSY, STENT PLACEMENT;  Surgeon: Kathie Rhodes, MD;  Location: [facility];  Service: Urology;  Laterality: Right;       Family History  Problem Relation Age of Onset   Heart disease Mother    Hypertension Mother    Stroke Mother    Colon cancer Mother 68   Breast cancer Mother 86   Heart disease Father        pacemaker   Aortic aneurysm Father    Hypertension Father    Lung cancer Father 67   Heart disease Sister    Atrial fibrillation Sister    Obesity Sister    Sleep apnea Sister    Heart attack Brother    Other Brother        muscle disease   Arthritis Brother    Stroke Brother    Atrial fibrillation Brother    Heart attack Maternal Grandmother    Diabetes Maternal Grandmother    Cancer Maternal Grandfather 40       hodgin's lymphoma   Heart attack Paternal Grandmother    Anxiety disorder Paternal Grandmother    Pneumonia Paternal Grandfather    Heart attack Brother    Atrial fibrillation Brother    Atrial fibrillation Brother    Heart attack Brother    Hypertension Brother    Hyperlipidemia Brother    Heart attack Brother    Other Brother        heart valve operation   Atrial fibrillation Brother    Lymphoma Niece 37   Liver cancer Nephew 2       great nephew; d. 7   Esophageal cancer Neg Hx    Rectal cancer Neg Hx    Stomach cancer Neg Hx     Social History   Tobacco Use   Smoking status: Former    Packs/day: 1.00    Years: 6.00    Pack years: 6.00    Types: Cigarettes    Start date: 01/11/1969    Quit date: 01/12/1969    Years since quitting: 51.7   Smokeless tobacco: Never   Tobacco comments:    1965-1971  Vaping Use   Vaping Use: Never used  Substance Use Topics   Alcohol use: Yes    Comment: occ   Drug use: Never    Home Medications Prior to Admission medications  Medication Sig Start Date End Date Taking? Authorizing Provider  apixaban (ELIQUIS) 5 MG TABS tablet TAKE ONE TABLET BY MOUTH TWICE A DAY 09/02/20    Minus Breeding, MD  ascorbic acid (VITAMIN C) 1000 MG tablet Take by mouth.    [provider]  atenolol (TENORMIN) 25 MG tablet TAKE ONE TABLET BY MOUTH EVERY MORNING, ONE TABLET AT NOON, AND ONE TABLET EVERY NIGHT AT BEDTIME 06/20/20   Mosie Lukes, MD  atorvastatin (LIPITOR) 10 MG tablet Take 1 tablet (10 mg total) by mouth in the morning. 07/21/19   Mosie Lukes, MD  clotrimazole-betamethasone (LOTRISONE) lotion Apply topically 2 (two) times daily. For 2 weeks. 12/27/19   Mosie Lukes, MD  colchicine 0.6 MG tablet For gout flares, take 1 tab po once then repeat in 1 hour as needed if pain persists up to a total of 3 doses 05/20/20   Mosie Lukes, MD  FLUoxetine (PROZAC) 10 MG tablet Take 1 tablet (10 mg total) by mouth 3 (three) times daily. 09/02/20   Mosie Lukes, MD  LORazepam (ATIVAN) 1 MG tablet TAKE ONE TABLET BY MOUTH TWICE A DAY AS NEEDED FOR ANXIETY OR SEDATION 06/20/20   Mosie Lukes, MD  omeprazole (PRILOSEC) 20 MG capsule Take 20 mg by mouth daily as needed (acid reflux).     [provider]    Allergies    Cefaclor, Cephalosporins, and Penicillins  Review of Systems   Review of Systems  Constitutional:  Negative for chills and fever.  HENT:  Negative for ear pain and sore throat.   Eyes:  Negative for pain and visual disturbance.  Respiratory:  Negative for cough and shortness of breath.   Cardiovascular:  Negative for chest pain and palpitations.  Gastrointestinal:  Negative for abdominal pain and vomiting.  Genitourinary:  Negative for dysuria and hematuria.  Musculoskeletal:  Negative for arthralgias and back pain.  Skin:  Negative for color change and rash.  Neurological:  Positive for headaches. Negative for seizures and syncope.  All other systems reviewed and are negative.  Physical Exam Updated Vital Signs BP (!) 155/79   Pulse (!) 55   Temp (!) 97.2 F (36.2 C)   Resp 16   Ht 1.829 m (6')   Wt 112.9 kg   SpO2 96%   BMI 33.77  kg/m   Physical Exam Vitals and nursing note reviewed.  Constitutional:      General: He is not in acute distress.    Appearance: Normal appearance. He is well-developed.  HENT:     Head: Normocephalic and atraumatic.  Eyes:     Extraocular Movements: Extraocular movements intact.     Conjunctiva/sclera: Conjunctivae normal.     Pupils: Pupils are equal, round, and reactive to light.  Cardiovascular:     Rate and Rhythm: Normal rate and regular rhythm.     Heart sounds: No murmur heard. Pulmonary:     Effort: Pulmonary effort is normal. No respiratory distress.     Breath sounds: Normal breath sounds. No wheezing.  Chest:     Chest wall: No tenderness.  Abdominal:     Palpations: Abdomen is soft.     Tenderness: There is no abdominal tenderness.  Musculoskeletal:        General: Normal range of motion.     Cervical back: Neck supple.  Skin:    General: Skin is warm and dry.  Neurological:     General: No focal deficit present.  Mental Status: He is alert and oriented to person, place, and time.     Cranial Nerves: No cranial nerve deficit.     Sensory: No sensory deficit.     Motor: No weakness.    ED Results / Procedures / Treatments   Labs (all labs ordered are listed, but only abnormal results are displayed) Labs Reviewed - No data to display  EKG EKG Interpretation  Date/Time:  Sunday September 15 2020 12:53:45 EDT Ventricular Rate:  59 PR Interval:  207 QRS Duration: 107 QT Interval:  410 QTC Calculation: 407 R Axis:   -41 Text Interpretation: Sinus rhythm Incomplete RBBB and LAFB Abnormal R-wave progression, late transition Otherwise no significant change He is EKGs have shown the bundle branch block.  The most recent one on 20 August 2019 however did not.  But once prior to that have.  So no significant change compared to those. Confirmed by Fredia Sorrow [phone]) on 09/15/2020 2:26:57 PM  Radiology No results found.  Procedures Procedures    Medications Ordered in ED Medications - No data to display  ED Course  I have reviewed the triage vital signs and the nursing notes.  Pertinent labs & imaging results that were available during my care of the patient were reviewed by me and considered in my medical decision making (see chart for details).    MDM Rules/Calculators/A&P                           EKG shows bundle branch block.  But that is unchanged from old EKGs.  His last EKG did not show that quite as much.  Patient is taking his atenolol.  Blood pressure here last recorded 155/79.  Patient states that his blood pressure was acceptable a week ago when he was at the doctor's office.  So is can start a blood pressure log record it daily give his primary care office a call.  Patient without anything that seems to be consistent with stroke no severe chest pain.  EKG as mentioned above.  No trouble breathing.  And also does have a slight headache but no severe headache.   Final Clinical Impression(s) / ED Diagnoses Final diagnoses:  Primary hypertension    Rx / DC Orders ED Discharge Orders     None        Fredia Sorrow, MD 09/15/20 1453

## 2020-09-15 NOTE — Discharge Instructions (Addendum)
Appears that your blood pressures are trending high for the past few days.  Continue or start a log book.  Give your primary care doctor's office a call on Tuesday for follow-up.  If the trend remains high you will need some adjustment in your blood pressure medicines continue to take your atenolol as you have been.  Continue to take your Eliquis.  Return for any strokelike symptoms severe headache crushing chest pain or any difficulty breathing.

## 2020-09-15 NOTE — ED Triage Notes (Signed)
Pt reports BP up despite taking meds; also reports slight HA and unsteady on feet

## 2020-09-19 ENCOUNTER — Telehealth: Payer: Self-pay | Admitting: *Deleted

## 2020-09-19 NOTE — Telephone Encounter (Signed)
Spoke with patient this morning and he will call the imaging department back and schedule.

## 2020-09-19 NOTE — Telephone Encounter (Signed)
-----   Message from Mosie Lukes, MD sent at 09/18/2020  4:52 PM EDT ----- Regarding: RE: unable to make contact Please contact the patient from the office and see if he still wants to proceed with MRI ----- Message ----- From: Antonieta Loveless Sent: 09/18/2020  10:55 AM EDT To: Mosie Lukes, MD Subject: unable to make contact                         Dr. Charlett Blake,  I have attempted to make contact with patient on: 09/05/20 left message 09/09/20 left message 09/12/20 left message 09/17/20 no message left.   To schedule the MRI that you ordered for him on 09/04/20. How would you like for [place] to proceed with this patient?  Thank you [place] Department.

## 2020-09-23 ENCOUNTER — Ambulatory Visit: Payer: Medicare Other | Admitting: Family Medicine

## 2020-09-23 ENCOUNTER — Encounter (HOSPITAL_BASED_OUTPATIENT_CLINIC_OR_DEPARTMENT_OTHER): Payer: Self-pay | Admitting: *Deleted

## 2020-09-23 ENCOUNTER — Other Ambulatory Visit: Payer: Self-pay

## 2020-09-23 ENCOUNTER — Telehealth: Payer: Self-pay

## 2020-09-23 ENCOUNTER — Emergency Department (HOSPITAL_BASED_OUTPATIENT_CLINIC_OR_DEPARTMENT_OTHER)
Admission: EM | Admit: 2020-09-23 | Discharge: 2020-09-23 | Disposition: A | Discharge status: Home / Self Care | Payer: Medicare Other | Attending: Emergency Medicine | Admitting: Emergency Medicine

## 2020-09-23 ENCOUNTER — Ambulatory Visit: Payer: Medicare Other | Admitting: General Practice

## 2020-09-23 DIAGNOSIS — Z8546 Personal history of malignant neoplasm of prostate: Secondary | ICD-10-CM | POA: Diagnosis not present

## 2020-09-23 DIAGNOSIS — R944 Abnormal results of kidney function studies: Secondary | ICD-10-CM | POA: Insufficient documentation

## 2020-09-23 DIAGNOSIS — R7989 Other specified abnormal findings of blood chemistry: Secondary | ICD-10-CM

## 2020-09-23 DIAGNOSIS — N1831 Chronic kidney disease, stage 3a: Secondary | ICD-10-CM | POA: Insufficient documentation

## 2020-09-23 DIAGNOSIS — Z7901 Long term (current) use of anticoagulants: Secondary | ICD-10-CM | POA: Insufficient documentation

## 2020-09-23 DIAGNOSIS — Z87891 Personal history of nicotine dependence: Secondary | ICD-10-CM | POA: Insufficient documentation

## 2020-09-23 DIAGNOSIS — Z79899 Other long term (current) drug therapy: Secondary | ICD-10-CM | POA: Diagnosis not present

## 2020-09-23 DIAGNOSIS — R030 Elevated blood-pressure reading, without diagnosis of hypertension: Secondary | ICD-10-CM | POA: Diagnosis present

## 2020-09-23 DIAGNOSIS — Z85828 Personal history of other malignant neoplasm of skin: Secondary | ICD-10-CM | POA: Insufficient documentation

## 2020-09-23 DIAGNOSIS — I129 Hypertensive chronic kidney disease with stage 1 through stage 4 chronic kidney disease, or unspecified chronic kidney disease: Secondary | ICD-10-CM | POA: Insufficient documentation

## 2020-09-23 DIAGNOSIS — I10 Essential (primary) hypertension: Secondary | ICD-10-CM

## 2020-09-23 LAB — COMPREHENSIVE METABOLIC PANEL
ALT: 22 U/L (ref 0–44)
AST: 21 U/L (ref 15–41)
Albumin: 4.3 g/dL (ref 3.5–5.0)
Alkaline Phosphatase: 83 U/L (ref 38–126)
Anion gap: 6 (ref 5–15)
BUN: 22 mg/dL (ref 8–23)
CO2: 28 mmol/L (ref 22–32)
Calcium: 10.2 mg/dL (ref 8.9–10.3)
Chloride: 103 mmol/L (ref 98–111)
Creatinine, Ser: 1.97 mg/dL — ABNORMAL HIGH (ref 0.61–1.24)
GFR, Estimated: 35 mL/min — ABNORMAL LOW (ref >60)
Glucose, Bld: 87 mg/dL (ref 70–99)
Potassium: 4.3 mmol/L (ref 3.5–5.1)
Sodium: 137 mmol/L (ref 135–145)
Total Bilirubin: 0.6 mg/dL (ref 0.3–1.2)
Total Protein: 7.7 g/dL (ref 6.5–8.1)

## 2020-09-23 LAB — CBC WITH DIFFERENTIAL/PLATELET
Abs Immature Granulocytes: 0.02 10*3/uL (ref 0.00–0.07)
Basophils Absolute: 0.1 10*3/uL (ref 0.0–0.1)
Basophils Relative: 1 %
Eosinophils Absolute: 0.3 10*3/uL (ref 0.0–0.5)
Eosinophils Relative: 3 %
HCT: 43.5 % (ref 39.0–52.0)
Hemoglobin: 14.6 g/dL (ref 13.0–17.0)
Immature Granulocytes: 0 %
Lymphocytes Relative: 22 %
Lymphs Abs: 1.8 10*3/uL (ref 0.7–4.0)
MCH: 31.3 pg (ref 26.0–34.0)
MCHC: 33.6 g/dL (ref 30.0–36.0)
MCV: 93.3 fL (ref 80.0–100.0)
Monocytes Absolute: 0.8 10*3/uL (ref 0.1–1.0)
Monocytes Relative: 9 %
Neutro Abs: 5.5 10*3/uL (ref 1.7–7.7)
Neutrophils Relative %: 65 %
Platelets: 212 10*3/uL (ref 150–400)
RBC: 4.66 MIL/uL (ref 4.22–5.81)
RDW: 13.4 % (ref 11.5–15.5)
WBC: 8.4 10*3/uL (ref 4.0–10.5)
nRBC: 0 % (ref 0.0–0.2)

## 2020-09-23 MED ORDER — ATENOLOL 25 MG PO TABS
25.0000 mg | ORAL_TABLET | Freq: Once | ORAL | Status: AC
  Administered 2020-09-23: 25 mg via ORAL
  Filled 2020-09-23: qty 1

## 2020-09-23 MED ORDER — SODIUM CHLORIDE 0.9 % IV BOLUS
500.0000 mL | Freq: Once | INTRAVENOUS | Status: AC
  Administered 2020-09-23: 500 mL via INTRAVENOUS

## 2020-09-23 NOTE — ED Triage Notes (Addendum)
Sent here by PMD for further eval for increased BP, seen here last week , cont with increased BP  and increased stress x 3 months

## 2020-09-23 NOTE — ED Triage Notes (Signed)
Pt reports taking his atenolol 4X a day rather than 3 X a day. Spoke with his PCP and cardiology who wanted him to come to ED today r/t elevated BPs

## 2020-09-23 NOTE — Telephone Encounter (Signed)
LATE ENTRY: @~9am pt walked in stating that his BP was 202/120's for the last week and was concerned and needs appt today. S/w Coletta Memos, FNP-C he stated that pt's BP is too high for medication to be adjusted here in the office and would need to go to the ER @~ 9:12am pt was told to go directly to the ER as he will probably need to have IV medication (that we cannot do here) to adjust BP medication to get his BP down ASAP. Pt verbalized and stated back direction and verbalized understanding.

## 2020-09-23 NOTE — ED Notes (Signed)
ED Provider at bedside. 

## 2020-09-23 NOTE — ED Provider Notes (Signed)
[facility] EMERGENCY DEPARTMENT Provider Note   CSN: [id number] Arrival date & time: 09/23/20  1305     History Chief Complaint  Patient presents with   Hypertension    David [name] is a 75 y.o. male with PMHx HTN, HLD, GERD, renal insufficiency s/2 solitary kidney who presents to the ED Today with complaint of elevated blood pressure.  Patient reports that he recently lost his blood pressure cuff.  He bought a new one last week and states that since that time his blood pressure has been in the [id number] systolic.  He states he went to his cardiologist today to try to do a walk-in visit however he was advised to go to his PCP.  He went to his PCP and they advised that he come to the ED for further evaluation.  His blood pressure was not checked either at the cardiologist or at his PCPs.  On arrival to the ED today patient's blood pressure is 149/88.  He states that he typically is supposed to take Atenolol 25 mg 3 times daily however with his blood pressure being so high the last few days he has been taking it 4 times daily.  Patient has no specific complaints at this time.   The history is provided by the patient and medical records.      Past Medical History:  Diagnosis Date   Adenoma of right adrenal gland 07/11/2002   2.4cm , noted on CT ABD   Allergy    Anxiety    Arthritis of both knees 03/26/2016   Astigmatism    Back pain    BCC (basal cell carcinoma of skin)    under right eye and right ear   Blood transfusion without reported diagnosis    Cataract 09/29/2016   Depression    Diverticulitis 2009   Diverticulosis    Family history of breast cancer    Family history of colon cancer    Fatty liver    GERD (gastroesophageal reflux disease)    Gout    Hearing loss of both ears 03/26/2016   History of atrial fibrillation    x 2 none since 2020   History of chronic prostatitis    started at age 46   History of kidney stones    Hyperglycemia    Hyperlipidemia     Hypertension    Incomplete right bundle branch block (RBBB)    Internal hemorrhoids    Kidney lesion 06/07/2015   Right midportion, 1.1x1.1 cm hyperechoic, noted on [place] ABD   LAFB (left anterior fascicular block)    Lipoma of axilla 09/2016   Liver lesion, right lobe 06/07/2015   2.5x2.4x2.4 cm hypoechoic lesion posterior aspect, noted on [place] ABD   Low back pain 03/26/2016   LVH (left ventricular hypertrophy) 08/06/2017   Moderate, Noted on ECHO   Medicare annual wellness visit, subsequent 03/12/2014   OA (osteoarthritis)    Back, Hands, Neck   Obesity 11/25/2007   Qualifier: Diagnosis of  By: Lenna Gilford MD, Deborra Medina    Other malaise and fatigue 03/13/2013   Premature ventricular complex    Prostate cancer ([place]) dx 2021   Renal insufficiency    Kidney removed right   Scoliosis    Upper thoracic and lumbar   Sinus bradycardia 08/06/2017   Noted on EKG   Vitamin D deficiency    resolved    Patient Active Problem List   Diagnosis Date Noted   Stage 3a chronic kidney disease ([place])  07/02/2020   Urinary frequency 02/12/2020   Genetic testing 09/26/2019   Family history of breast cancer    Family history of colon cancer    Educated about COVID-19 virus infection 08/26/2019   Malignant neoplasm of prostate ([place]) 04/28/2019   Pruritus 02/03/2019   LVH (left ventricular hypertrophy) 10/30/2018   Tinea versicolor 09/28/2018   Thoracic back pain 09/26/2018   Hypercalcemia 08/08/2018   Nonfunctioning kidney 08/04/2018   Arm pain 07/18/2018   Right hip pain 06/22/2018   Ureteral obstruction 06/07/2018   Hydronephrosis with ureteropelvic junction (UPJ) obstruction 04/23/2018   Recurrent nephrolithiasis 04/23/2018   Hydronephrosis 03/01/2018   Floppy eyelid syndrome of both eyes 01/24/2018   Foot pain, bilateral 12/28/2017   Kidney stone 09/18/2017   Insomnia 09/17/2017   Abnormal TSH 04/15/2017   Senile nuclear sclerosis 01/18/2017   Cataract 09/29/2016   Muscle cramp  09/29/2016   Nocturia 09/29/2016   Hyperglycemia 03/29/2016   Low back pain 03/26/2016   Cataracts, bilateral 03/26/2016   Arthritis of both knees 03/26/2016   Hearing loss 03/26/2016   Complex renal cyst 06/16/2015   History of atrial fibrillation 06/16/2015   Snoring 06/07/2015   Somnolence 06/07/2015   Preventative health care 03/07/2015   Lipoma of axilla 06/04/2014   Tinea cruris [id number]   Lichen simplex chronicus 03/20/2014   Medicare annual wellness visit, subsequent 03/12/2014   Depression with anxiety 09/25/2013   Constipation 09/25/2013   Gouty arthritis of toe of left foot 08/01/2013   Skin lesion 05/27/2013   Rash 05/26/2013   Abnormal liver function 03/18/2013   IBS (irritable bowel syndrome) 03/18/2013   Other malaise and fatigue 03/13/2013   Tinea corporis 03/13/2013   Gout 12/14/2012   Diarrhea 12/14/2012   Rectal irritation 09/18/2012   Diverticulosis    BCC (basal cell carcinoma of skin)    Scoliosis 01/19/2011   Hyperlipidemia, mixed 03/11/2010   Essential hypertension, benign 03/11/2010   ALLERGIC RHINITIS 03/11/2010   Arthralgia 03/11/2010   Obesity 11/25/2007   BENIGN NEOPLASM OF ADRENAL GLAND 09/22/2007   Fatty liver disease, nonalcoholic 0000000   Osteoarthritis 09/22/2007   SPONDYLOSIS, LUMBAR 09/22/2007   GERD 04/12/2007    Past Surgical History:  Procedure Laterality Date   CARDIOVERSION  07/31/2017   CATARACT EXTRACTION, BILATERAL     COLON SURGERY  2009   segmental sigmoid resection   COLONOSCOPY     CYSTOSCOPY N/A 07/28/2019   Procedure: CYSTOSCOPY FLEXIBLE;  Surgeon: Cleon Gustin, MD;  [facility];  Service: Urology;  Laterality: N/A;   CYSTOSCOPY W/ RETROGRADES Right 06/07/2018   Procedure: CYSTOSCOPY WITH RETROGRADE PYELOGRAM, uphrostogram;  Surgeon: Cleon Gustin, MD;  Location: [facility];  Service: Urology;  Laterality: Right;   CYSTOSCOPY WITH URETEROSCOPY AND STENT PLACEMENT Right  02/28/2018   Procedure: CYSTOSCOPY WITH URETEROSCOPY [place];  Surgeon: Kathie Rhodes, MD;  Location: [facility];  Service: Urology;  Laterality: Right;   CYSTOSCOPY/URETEROSCOPY/HOLMIUM LASER/STENT PLACEMENT Right 11/29/2017   Procedure: CYSTOSCOPY/RETROGRADE/URETEROSCOPY/HOLMIUM LASER/STENT PLACEMENT;  Surgeon: Kathie Rhodes, MD;  Location: [facility];  Service: Urology;  Laterality: Right;   EYE SURGERY Bilateral 01/12/2017   cataract removal   history of blood tranfusion  age 84   HOLMIUM LASER APPLICATION Right [id number]   Procedure: HOLMIUM LASER APPLICATION;  Surgeon: Kathie Rhodes, MD;  Location: [facility];  Service: Urology;  Laterality: Right;   IR BALLOON DILATION URETERAL STRICTURE RIGHT  03/14/2018   IR NEPHRO TUBE REMOV/FL  03/17/2018  IR NEPHROSTOGRAM RIGHT THRU EXISTING ACCESS  03/17/2018   IR NEPHROSTOMY EXCHANGE RIGHT  03/14/2018   IR NEPHROSTOMY EXCHANGE RIGHT  06/21/2018   IR NEPHROSTOMY PLACEMENT RIGHT  03/02/2018   IR NEPHROSTOMY PLACEMENT RIGHT  04/20/2018   IR URETERAL STENT PLACEMENT EXISTING ACCESS RIGHT  03/14/2018   NOSE SURGERY     Submucous resection age 66   NUCLEAR STRESS TEST  06/03/2009   RADIOACTIVE SEED IMPLANT N/A 07/28/2019   Procedure: RADIOACTIVE SEED IMPLANT/BRACHYTHERAPY IMPLANT;  Surgeon: Cleon Gustin, MD;  Location: [facility];  Service: Urology;  Laterality: N/A;  90 MINS   ROBOT ASSISTED LAPAROSCOPIC NEPHRECTOMY Right 08/04/2018   Procedure: XI ROBOTIC ASSISTED LAPAROSCOPIC NEPHRECTOMY;  Surgeon: Cleon Gustin, MD;  Location: [facility];  Service: Urology;  Laterality: Right;  3 HRS   ROBOT ASSISTED PYELOPLASTY Right 06/07/2018   Procedure: attempted XI ROBOTIC ASSISTED PYELOPLASTY, lysis of adhesions;  Surgeon: Cleon Gustin, MD;  Location: [facility];  Service: Urology;  Laterality: Right;  3 HRS   SKIN BIOPSY     SPACE OAR INSTILLATION N/A 07/28/2019   Procedure: SPACE OAR INSTILLATION;  Surgeon:  Cleon Gustin, MD;  Location: [facility];  Service: Urology;  Laterality: N/A;   URETEROSCOPY WITH HOLMIUM LASER LITHOTRIPSY Right 09/24/2017   Procedure: CYSTOSCOPY, URETEROSCOPY WITH HOLMIUM LASER LITHOTRIPSY, STENT PLACEMENT;  Surgeon: Kathie Rhodes, MD;  Location: [facility];  Service: Urology;  Laterality: Right;       Family History  Problem Relation Age of Onset   Heart disease Mother    Hypertension Mother    Stroke Mother    Colon cancer Mother 79   Breast cancer Mother 60   Heart disease Father        pacemaker   Aortic aneurysm Father    Hypertension Father    Lung cancer Father 102   Heart disease Sister    Atrial fibrillation Sister    Obesity Sister    Sleep apnea Sister    Heart attack Brother    Other Brother        muscle disease   Arthritis Brother    Stroke Brother    Atrial fibrillation Brother    Heart attack Brother    Atrial fibrillation Brother    Diabetes Brother    Atrial fibrillation Brother    Heart attack Brother    Hypertension Brother    Hyperlipidemia Brother    Heart attack Brother    Other Brother        heart valve operation   Atrial fibrillation Brother    Heart attack Maternal Grandmother    Diabetes Maternal Grandmother    Cancer Maternal Grandfather 21       hodgin's lymphoma   Heart attack Paternal Grandmother    Anxiety disorder Paternal Grandmother    Pneumonia Paternal Grandfather    Liver cancer Nephew 2       great nephew; d. 18   Lymphoma Niece 32   Esophageal cancer Neg Hx    Rectal cancer Neg Hx    Stomach cancer Neg Hx     Social History   Tobacco Use   Smoking status: Former    Packs/day: 1.00    Years: 6.00    Pack years: 6.00    Types: Cigarettes    Start date: 01/11/1969    Quit date: 01/12/1969    Years since quitting: 51.7   Smokeless tobacco: Never   Tobacco comments:  [id number]  Vaping Use   Vaping Use: Never used  Substance Use Topics   Alcohol use: Yes     Comment: occ   Drug use: Never    Home Medications Prior to Admission medications   Medication Sig Start Date End Date Taking? Authorizing Provider  apixaban (ELIQUIS) 5 MG TABS tablet TAKE ONE TABLET BY MOUTH TWICE A DAY 09/02/20   Minus Breeding, MD  ascorbic acid (VITAMIN C) 1000 MG tablet Take by mouth.    [provider]  atenolol (TENORMIN) 25 MG tablet TAKE ONE TABLET BY MOUTH EVERY MORNING, ONE TABLET AT NOON, AND ONE TABLET EVERY NIGHT AT BEDTIME 06/20/20   Mosie Lukes, MD  atorvastatin (LIPITOR) 10 MG tablet Take 1 tablet (10 mg total) by mouth in the morning. 07/21/19   Mosie Lukes, MD  clotrimazole-betamethasone (LOTRISONE) lotion Apply topically 2 (two) times daily. For 2 weeks. 12/27/19   Mosie Lukes, MD  colchicine 0.6 MG tablet For gout flares, take 1 tab po once then repeat in 1 hour as needed if pain persists up to a total of 3 doses 05/20/20   Mosie Lukes, MD  FLUoxetine (PROZAC) 10 MG tablet Take 1 tablet (10 mg total) by mouth 3 (three) times daily. 09/02/20   Mosie Lukes, MD  LORazepam (ATIVAN) 1 MG tablet TAKE ONE TABLET BY MOUTH TWICE A DAY AS NEEDED FOR ANXIETY OR SEDATION 06/20/20   Mosie Lukes, MD  omeprazole (PRILOSEC) 20 MG capsule Take 20 mg by mouth daily as needed (acid reflux).     [provider]    Allergies    Cefaclor, Cephalosporins, and Penicillins  Review of Systems   Review of Systems  Constitutional:  Negative for fever.  Respiratory:  Negative for shortness of breath.   Cardiovascular:  Negative for chest pain.  Neurological:  Negative for headaches.  All other systems reviewed and are negative.  Physical Exam Updated Vital Signs BP (!) 155/79   Pulse (!) 54   Temp 98.4 F (36.9 C) (Oral)   Resp 16   Ht '6\' 1"'$  (1.854 m)   Wt 112.5 kg   SpO2 97%   BMI 32.72 kg/m   Physical Exam Vitals and nursing note reviewed.  Constitutional:      Appearance: He is not ill-appearing or diaphoretic.  HENT:      Head: Normocephalic and atraumatic.  Eyes:     Conjunctiva/sclera: Conjunctivae normal.  Cardiovascular:     Rate and Rhythm: Normal rate and regular rhythm.     Pulses: Normal pulses.  Pulmonary:     Effort: Pulmonary effort is normal.     Breath sounds: Normal breath sounds. No wheezing, rhonchi or rales.  Abdominal:     Palpations: Abdomen is soft.     Tenderness: There is no abdominal tenderness.  Musculoskeletal:     Cervical back: Neck supple.     Right lower leg: No edema.     Left lower leg: No edema.  Skin:    General: Skin is warm and dry.  Neurological:     Mental Status: He is alert.    ED Results / Procedures / Treatments   Labs (all labs ordered are listed, but only abnormal results are displayed) Labs Reviewed  COMPREHENSIVE METABOLIC PANEL - Abnormal; Notable for the following components:      Result Value   Creatinine, Ser 1.97 (*)    GFR, Estimated 35 (*)    All other components within  normal limits  CBC WITH DIFFERENTIAL/PLATELET    EKG None  Radiology No results found.  Procedures Procedures   Medications Ordered in ED Medications  sodium chloride 0.9 % bolus 500 mL (500 mLs Intravenous New Bag/Given 09/23/20 1759)  atenolol (TENORMIN) tablet 25 mg (25 mg Oral Given 09/23/20 1823)    ED Course  I have reviewed the triage vital signs and the nursing notes.  Pertinent labs & imaging results that were available during my care of the patient were reviewed by me and considered in my medical decision making (see chart for details).    MDM Rules/Calculators/A&P                           75 year old male who presents to the ED today for elevated blood pressure readings over the past week.  He was seen in the ED on 09/04 for same.  Blood pressure at that time 155/79.  He had an EKG done which showed a bundle branch block, consistent with previous.  He was discharged home with advised to follow-up with his PCP.  He reports blood pressures have been  in the 220s for the past week prompting return ED visit today.  On arrival to the ED patient's pressure is 149/88 with repeat 155/79.  He has increases of Tylenol from 3 times a day to 4 times a day.  He is bradycardic on exam today in the 35s however this seems consistent with previous.  He has no specific symptoms at this time.  He does mention he has a solitary kidney.  We will plan for lab work to assess for elevated creatinine at this time.  If labs unremarkable will plan to discharge home with instructions to follow-up with his PCP.  It does appear he went to his cardiologist and PCP today complaining of high blood pressure however his blood pressure was not checked and he was told immediately to go to the ED.   CBC without leukocytosis. Hgb stable at 14.6 CMP with slight elevation in creatinine 1.97 however not consistent with Aki at this time. Small amount of fluids provided for same.   Lab Results  Component Value Date   CREATININE 1.97 (H) 09/23/2020   CREATININE 1.77 (H) 08/05/2020   CREATININE 1.86 (H) 02/05/2020   While in the ED BP went from 150s to 163/88 at the highest. Pt has not had his atenolol dose this afternoon - provided with same.   Workup overall reassuring in the ED today. Have instructed patient he needs to follow up with his PCP regarding ED visit and concern for elevation in blood pressure. He does mention receiving a new cuff recently - question if it needs calibration? Have recommended he take it to his PCP for same. He will need to have his kidney function rechecked in 1-2 weeks. Encouraged increased oral intake for same. Stable for discharge home at this time.   This note was prepared using Dragon voice recognition software and may include unintentional dictation errors due to the inherent limitations of voice recognition software.  Final Clinical Impression(s) / ED Diagnoses Final diagnoses:  Primary hypertension  Elevated serum creatinine    Rx / DC  Orders ED Discharge Orders     None        Discharge Instructions      Please follow up with your PCP regarding ED visit today and for reevaluation of your high blood pressures.   Given you just  recently bought a new blood pressure cuff you may need to get it calibrated to ensure it is giving an accurate reading. You can take it to your PCP's office to have this done.   Your kidney function was slightly elevated in the ED today. We gave you a small amount of IV fluids for same. You will need to have your function rechecked in 1-2 weeks. Please increase the amount of fluids you drink on a daily basis prior to getting this checked.   Return to the ED for any new/worsening symptoms including headache, vision changes, chest pain, shortness of breath, or any other new/concerning symptoms.        Eustaquio Maize, PA-C [id number]    Lianne Cure, DO [id number] [phone]

## 2020-09-23 NOTE — Discharge Instructions (Addendum)
Please follow up with your PCP regarding ED visit today and for reevaluation of your high blood pressures.   Given you just recently bought a new blood pressure cuff you may need to get it calibrated to ensure it is giving an accurate reading. You can take it to your PCP's office to have this done.   Your kidney function was slightly elevated in the ED today. We gave you a small amount of IV fluids for same. You will need to have your function rechecked in 1-2 weeks. Please increase the amount of fluids you drink on a daily basis prior to getting this checked.   Return to the ED for any new/worsening symptoms including headache, vision changes, chest pain, shortness of breath, or any other new/concerning symptoms.

## 2020-09-25 ENCOUNTER — Other Ambulatory Visit: Payer: Self-pay | Admitting: Family Medicine

## 2020-09-27 ENCOUNTER — Ambulatory Visit (HOSPITAL_COMMUNITY): Payer: Medicare Other | Attending: Cardiology

## 2020-09-27 ENCOUNTER — Other Ambulatory Visit: Payer: Self-pay

## 2020-09-27 DIAGNOSIS — I517 Cardiomegaly: Secondary | ICD-10-CM | POA: Insufficient documentation

## 2020-09-27 DIAGNOSIS — I480 Paroxysmal atrial fibrillation: Secondary | ICD-10-CM | POA: Diagnosis not present

## 2020-09-27 DIAGNOSIS — I10 Essential (primary) hypertension: Secondary | ICD-10-CM | POA: Insufficient documentation

## 2020-09-28 LAB — ECHOCARDIOGRAM COMPLETE
Area-P 1/2: 2.52 cm2
P 1/2 time: 657 msec
S' Lateral: 3.5 cm

## 2020-10-07 ENCOUNTER — Other Ambulatory Visit: Payer: Self-pay | Admitting: *Deleted

## 2020-10-07 ENCOUNTER — Encounter: Payer: Self-pay | Admitting: *Deleted

## 2020-10-07 DIAGNOSIS — I712 Thoracic aortic aneurysm, without rupture, unspecified: Secondary | ICD-10-CM

## 2020-10-11 LAB — PSA: PSA: 3.84

## 2020-10-12 ENCOUNTER — Ambulatory Visit (HOSPITAL_BASED_OUTPATIENT_CLINIC_OR_DEPARTMENT_OTHER)
Admission: RE | Admit: 2020-10-12 | Discharge: 2020-10-12 | Disposition: A | Discharge status: Home / Self Care | Payer: Medicare Other | Source: Ambulatory Visit | Attending: Family Medicine | Admitting: Family Medicine

## 2020-10-12 ENCOUNTER — Other Ambulatory Visit: Payer: Self-pay

## 2020-10-12 DIAGNOSIS — M47817 Spondylosis without myelopathy or radiculopathy, lumbosacral region: Secondary | ICD-10-CM | POA: Diagnosis present

## 2020-10-12 DIAGNOSIS — M545 Low back pain, unspecified: Secondary | ICD-10-CM | POA: Insufficient documentation

## 2020-10-12 DIAGNOSIS — G8929 Other chronic pain: Secondary | ICD-10-CM | POA: Diagnosis present

## 2020-10-15 ENCOUNTER — Other Ambulatory Visit: Payer: Self-pay | Admitting: Family Medicine

## 2020-10-15 DIAGNOSIS — M79604 Pain in right leg: Secondary | ICD-10-CM

## 2020-10-15 DIAGNOSIS — M545 Low back pain, unspecified: Secondary | ICD-10-CM

## 2020-10-16 ENCOUNTER — Encounter (HOSPITAL_BASED_OUTPATIENT_CLINIC_OR_DEPARTMENT_OTHER): Payer: Self-pay

## 2020-10-16 ENCOUNTER — Other Ambulatory Visit: Payer: Self-pay

## 2020-10-16 ENCOUNTER — Emergency Department (HOSPITAL_BASED_OUTPATIENT_CLINIC_OR_DEPARTMENT_OTHER): Payer: Medicare Other

## 2020-10-16 ENCOUNTER — Emergency Department (HOSPITAL_BASED_OUTPATIENT_CLINIC_OR_DEPARTMENT_OTHER)
Admission: EM | Admit: 2020-10-16 | Discharge: 2020-10-16 | Disposition: A | Discharge status: Home / Self Care | Payer: Medicare Other | Attending: Emergency Medicine | Admitting: Emergency Medicine

## 2020-10-16 DIAGNOSIS — N1831 Chronic kidney disease, stage 3a: Secondary | ICD-10-CM | POA: Insufficient documentation

## 2020-10-16 DIAGNOSIS — Z8546 Personal history of malignant neoplasm of prostate: Secondary | ICD-10-CM | POA: Diagnosis not present

## 2020-10-16 DIAGNOSIS — R072 Precordial pain: Secondary | ICD-10-CM | POA: Insufficient documentation

## 2020-10-16 DIAGNOSIS — Z87891 Personal history of nicotine dependence: Secondary | ICD-10-CM | POA: Diagnosis not present

## 2020-10-16 DIAGNOSIS — Z85828 Personal history of other malignant neoplasm of skin: Secondary | ICD-10-CM | POA: Insufficient documentation

## 2020-10-16 DIAGNOSIS — R0789 Other chest pain: Secondary | ICD-10-CM | POA: Diagnosis present

## 2020-10-16 DIAGNOSIS — I129 Hypertensive chronic kidney disease with stage 1 through stage 4 chronic kidney disease, or unspecified chronic kidney disease: Secondary | ICD-10-CM | POA: Insufficient documentation

## 2020-10-16 LAB — CBC WITH DIFFERENTIAL/PLATELET
Abs Immature Granulocytes: 0.02 10*3/uL (ref 0.00–0.07)
Basophils Absolute: 0.1 10*3/uL (ref 0.0–0.1)
Basophils Relative: 1 %
Eosinophils Absolute: 0.3 10*3/uL (ref 0.0–0.5)
Eosinophils Relative: 4 %
HCT: 42.2 % (ref 39.0–52.0)
Hemoglobin: 14.2 g/dL (ref 13.0–17.0)
Immature Granulocytes: 0 %
Lymphocytes Relative: 29 %
Lymphs Abs: 2 10*3/uL (ref 0.7–4.0)
MCH: 31.1 pg (ref 26.0–34.0)
MCHC: 33.6 g/dL (ref 30.0–36.0)
MCV: 92.3 fL (ref 80.0–100.0)
Monocytes Absolute: 0.6 10*3/uL (ref 0.1–1.0)
Monocytes Relative: 9 %
Neutro Abs: 3.9 10*3/uL (ref 1.7–7.7)
Neutrophils Relative %: 57 %
Platelets: 195 10*3/uL (ref 150–400)
RBC: 4.57 MIL/uL (ref 4.22–5.81)
RDW: 13.4 % (ref 11.5–15.5)
WBC: 7 10*3/uL (ref 4.0–10.5)
nRBC: 0 % (ref 0.0–0.2)

## 2020-10-16 LAB — COMPREHENSIVE METABOLIC PANEL
ALT: 23 U/L (ref 0–44)
AST: 23 U/L (ref 15–41)
Albumin: 3.9 g/dL (ref 3.5–5.0)
Alkaline Phosphatase: 80 U/L (ref 38–126)
Anion gap: 7 (ref 5–15)
BUN: 20 mg/dL (ref 8–23)
CO2: 26 mmol/L (ref 22–32)
Calcium: 9.8 mg/dL (ref 8.9–10.3)
Chloride: 104 mmol/L (ref 98–111)
Creatinine, Ser: 1.76 mg/dL — ABNORMAL HIGH (ref 0.61–1.24)
GFR, Estimated: 40 mL/min — ABNORMAL LOW (ref >60)
Glucose, Bld: 105 mg/dL — ABNORMAL HIGH (ref 70–99)
Potassium: 3.9 mmol/L (ref 3.5–5.1)
Sodium: 137 mmol/L (ref 135–145)
Total Bilirubin: 0.6 mg/dL (ref 0.3–1.2)
Total Protein: 6.9 g/dL (ref 6.5–8.1)

## 2020-10-16 LAB — LIPASE, BLOOD: Lipase: 30 U/L (ref 11–51)

## 2020-10-16 LAB — TROPONIN I (HIGH SENSITIVITY)
Troponin I (High Sensitivity): 4 ng/L (ref <18)
Troponin I (High Sensitivity): 4 ng/L (ref <18)

## 2020-10-16 NOTE — ED Provider Notes (Signed)
Emergency Department Provider Note   I have reviewed the triage vital signs and the nursing notes.   HISTORY  Chief Complaint Chest Pain   HPI David [name] is a 75 y.o. male with past medical history reviewed below presents to the emergency department with tightness in his chest and elevated blood pressures at home.  Patient states that he had a minor fall along with his wife yesterday.  He was assisting her when she tripped and fell causing him to fall the ground.  He had no pain during the episode and went about his day as normal.  He woke up in the night with some tightness in his chest but was able to stretch and drink some water and the pain went away.  He woke up this morning and continued to have a central chest tightness without radiation.  No shortness of breath or fevers.  No chills.  No productive cough.  His tightness has improved significantly but noted that his blood pressures were high with systolic pressures in the 160 to almost 170 range.  He is compliant with his home medications.  He is not having any ripping/tearing pain in his chest.  He did have some thoracic aorta dilation seen on recent echo and has plan for surveillance CTs scheduled for October 2023.  He follows with [facility] for his atrial fibrillation.    Past Medical History:  Diagnosis Date   Adenoma of right adrenal gland 07/11/2002   2.4cm , noted on CT ABD   Allergy    Anxiety    Arthritis of both knees 03/26/2016   Astigmatism    Back pain    BCC (basal cell carcinoma of skin)    under right eye and right ear   Blood transfusion without reported diagnosis    Cataract 09/29/2016   Depression    Diverticulitis 2009   Diverticulosis    Family history of breast cancer    Family history of colon cancer    Fatty liver    GERD (gastroesophageal reflux disease)    Gout    Hearing loss of both ears 03/26/2016   History of atrial fibrillation    x 2 none since 2020   History of  chronic prostatitis    started at age 25   History of kidney stones    Hyperglycemia    Hyperlipidemia    Hypertension    Incomplete right bundle branch block (RBBB)    Internal hemorrhoids    Kidney lesion 06/07/2015   Right midportion, 1.1x1.1 cm hyperechoic, noted on [place] ABD   LAFB (left anterior fascicular block)    Lipoma of axilla 09/2016   Liver lesion, right lobe 06/07/2015   2.5x2.4x2.4 cm hypoechoic lesion posterior aspect, noted on [place] ABD   Low back pain 03/26/2016   LVH (left ventricular hypertrophy) 08/06/2017   Moderate, Noted on ECHO   Medicare annual wellness visit, subsequent 03/12/2014   OA (osteoarthritis)    Back, Hands, Neck   Obesity 11/25/2007   Qualifier: Diagnosis of  By: Lenna Gilford MD, Deborra Medina    Other malaise and fatigue 03/13/2013   Premature ventricular complex    Prostate cancer [facility]) dx 2021   Renal insufficiency    Kidney removed right   Scoliosis    Upper thoracic and lumbar   Sinus bradycardia 08/06/2017   Noted on EKG   Vitamin D deficiency    resolved    Patient Active Problem List   Diagnosis  Date Noted   Stage 3a chronic kidney disease ([place]) 07/02/2020   Urinary frequency 02/12/2020   Genetic testing 09/26/2019   Family history of breast cancer    Family history of colon cancer    Educated about COVID-19 virus infection 08/26/2019   Malignant neoplasm of prostate ([place]) 04/28/2019   Pruritus 02/03/2019   LVH (left ventricular hypertrophy) 10/30/2018   Tinea versicolor 09/28/2018   Thoracic back pain 09/26/2018   Hypercalcemia 08/08/2018   Nonfunctioning kidney 08/04/2018   Arm pain 07/18/2018   Right hip pain 06/22/2018   Ureteral obstruction 06/07/2018   Hydronephrosis with ureteropelvic junction (UPJ) obstruction 04/23/2018   Recurrent nephrolithiasis 04/23/2018   Hydronephrosis 03/01/2018   Floppy eyelid syndrome of both eyes 01/24/2018   Foot pain, bilateral 12/28/2017   Kidney stone 09/18/2017   Insomnia 09/17/2017    Abnormal TSH 04/15/2017   Senile nuclear sclerosis 01/18/2017   Cataract 09/29/2016   Muscle cramp 09/29/2016   Nocturia 09/29/2016   Hyperglycemia 03/29/2016   Low back pain 03/26/2016   Cataracts, bilateral 03/26/2016   Arthritis of both knees 03/26/2016   Hearing loss 03/26/2016   Complex renal cyst 06/16/2015   History of atrial fibrillation 06/16/2015   Snoring 06/07/2015   Somnolence 06/07/2015   Preventative health care 03/07/2015   Lipoma of axilla 06/04/2014   Tinea cruris 20/35/5974   Lichen simplex chronicus 03/20/2014   Medicare annual wellness visit, subsequent 03/12/2014   Depression with anxiety 09/25/2013   Constipation 09/25/2013   Gouty arthritis of toe of left foot 08/01/2013   Skin lesion 05/27/2013   Rash 05/26/2013   Abnormal liver function 03/18/2013   IBS (irritable bowel syndrome) 03/18/2013   Other malaise and fatigue 03/13/2013   Tinea corporis 03/13/2013   Gout 12/14/2012   Diarrhea 12/14/2012   Rectal irritation 09/18/2012   Diverticulosis    BCC (basal cell carcinoma of skin)    Scoliosis 01/19/2011   Hyperlipidemia, mixed 03/11/2010   Essential hypertension, benign 03/11/2010   ALLERGIC RHINITIS 03/11/2010   Arthralgia 03/11/2010   Obesity 11/25/2007   BENIGN NEOPLASM OF ADRENAL GLAND 09/22/2007   Fatty liver disease, nonalcoholic 16/38/4536   Osteoarthritis 09/22/2007   SPONDYLOSIS, LUMBAR 09/22/2007   GERD 04/12/2007    Past Surgical History:  Procedure Laterality Date   CARDIOVERSION  07/31/2017   CATARACT EXTRACTION, BILATERAL     COLON SURGERY  2009   segmental sigmoid resection   COLONOSCOPY     CYSTOSCOPY N/A 07/28/2019   Procedure: CYSTOSCOPY FLEXIBLE;  Surgeon: Cleon Gustin, MD;  Location: [facility];  Service: Urology;  Laterality: N/A;   CYSTOSCOPY W/ RETROGRADES Right 06/07/2018   Procedure: CYSTOSCOPY WITH RETROGRADE PYELOGRAM, uphrostogram;  Surgeon: Cleon Gustin, MD;  Location: [facility];   Service: Urology;  Laterality: Right;   CYSTOSCOPY WITH URETEROSCOPY AND STENT PLACEMENT Right 02/28/2018   Procedure: CYSTOSCOPY WITH URETEROSCOPY [place];  Surgeon: Kathie Rhodes, MD;  Location: [facility];  Service: Urology;  Laterality: Right;   CYSTOSCOPY/URETEROSCOPY/HOLMIUM LASER/STENT PLACEMENT Right 11/29/2017   Procedure: CYSTOSCOPY/RETROGRADE/URETEROSCOPY/HOLMIUM LASER/STENT PLACEMENT;  Surgeon: Kathie Rhodes, MD;  Location: [facility];  Service: Urology;  Laterality: Right;   EYE SURGERY Bilateral 01/12/2017   cataract removal   history of blood tranfusion  age 64   HOLMIUM LASER APPLICATION Right 4/68/0321   Procedure: HOLMIUM LASER APPLICATION;  Surgeon: Kathie Rhodes, MD;  [facility];  Service: Urology;  Laterality: Right;   IR BALLOON DILATION URETERAL STRICTURE RIGHT  03/14/2018   IR NEPHRO TUBE REMOV/FL  03/17/2018   IR NEPHROSTOGRAM RIGHT THRU EXISTING ACCESS  03/17/2018   IR NEPHROSTOMY EXCHANGE RIGHT  03/14/2018   IR NEPHROSTOMY EXCHANGE RIGHT  06/21/2018   IR NEPHROSTOMY PLACEMENT RIGHT  03/02/2018   IR NEPHROSTOMY PLACEMENT RIGHT  04/20/2018   IR URETERAL STENT PLACEMENT EXISTING ACCESS RIGHT  03/14/2018   NOSE SURGERY     Submucous resection age 8   NUCLEAR STRESS TEST  06/03/2009   RADIOACTIVE SEED IMPLANT N/A 07/28/2019   Procedure: RADIOACTIVE SEED IMPLANT/BRACHYTHERAPY IMPLANT;  Surgeon: Cleon Gustin, MD;  Location: [facility];  Service: Urology;  Laterality: N/A;  90 MINS   ROBOT ASSISTED LAPAROSCOPIC NEPHRECTOMY Right 08/04/2018   Procedure: XI ROBOTIC ASSISTED LAPAROSCOPIC NEPHRECTOMY;  Surgeon: Cleon Gustin, MD;  Location: [facility];  Service: Urology;  Laterality: Right;  3 HRS   ROBOT ASSISTED PYELOPLASTY Right 06/07/2018   Procedure: attempted XI ROBOTIC ASSISTED PYELOPLASTY, lysis of adhesions;  Surgeon: Cleon Gustin, MD;  Location: [facility];  Service: Urology;  Laterality: Right;  3 HRS   SKIN  BIOPSY     SPACE OAR INSTILLATION N/A 07/28/2019   Procedure: SPACE OAR INSTILLATION;  Surgeon: Cleon Gustin, MD;  Location: [facility];  Service: Urology;  Laterality: N/A;   URETEROSCOPY WITH HOLMIUM LASER LITHOTRIPSY Right 09/24/2017   Procedure: CYSTOSCOPY, URETEROSCOPY WITH HOLMIUM LASER LITHOTRIPSY, STENT PLACEMENT;  Surgeon: Kathie Rhodes, MD;  Location: [facility];  Service: Urology;  Laterality: Right;    Allergies Cefaclor, Cephalosporins, and Penicillins  Family History  Problem Relation Age of Onset   Heart disease Mother    Hypertension Mother    Stroke Mother    Colon cancer Mother 5   Breast cancer Mother 74   Heart disease Father        pacemaker   Aortic aneurysm Father    Hypertension Father    Lung cancer Father 58   Heart disease Sister    Atrial fibrillation Sister    Obesity Sister    Sleep apnea Sister    Heart attack Brother    Other Brother        muscle disease   Arthritis Brother    Stroke Brother    Atrial fibrillation Brother    Heart attack Brother    Atrial fibrillation Brother    Diabetes Brother    Atrial fibrillation Brother    Heart attack Brother    Hypertension Brother    Hyperlipidemia Brother    Heart attack Brother    Other Brother        heart valve operation   Atrial fibrillation Brother    Heart attack Maternal Grandmother    Diabetes Maternal Grandmother    Cancer Maternal Grandfather 64       hodgin's lymphoma   Heart attack Paternal Grandmother    Anxiety disorder Paternal Grandmother    Pneumonia Paternal Grandfather    Liver cancer Nephew 2       great nephew; d. 1   Lymphoma Niece 9   Esophageal cancer Neg Hx    Rectal cancer Neg Hx    Stomach cancer Neg Hx     Social History Social History   Tobacco Use   Smoking status: Former    Packs/day: 1.00    Years: 6.00    Pack years: 6.00    Types: Cigarettes    Start date: 01/11/1969    Quit date: 01/12/1969  Years  since quitting: 51.7   Smokeless tobacco: Never   Tobacco comments:    1965-1971  Vaping Use   Vaping Use: Never used  Substance Use Topics   Alcohol use: Yes    Comment: occ   Drug use: Never    Review of Systems  Constitutional: No fever/chills Eyes: No visual changes. ENT: No sore throat. Cardiovascular: Positive chest pain/tightness and elevated BP at home. Respiratory: Denies shortness of breath. Gastrointestinal: No abdominal pain.  No nausea, no vomiting.  No diarrhea.  No constipation. Genitourinary: Negative for dysuria. Musculoskeletal: Negative for back pain. Skin: Negative for rash. Neurological: Negative for headaches, focal weakness or numbness.  10-point ROS otherwise negative.  ____________________________________________   PHYSICAL EXAM:  VITAL SIGNS: ED Triage Vitals  Enc Vitals Group     BP 10/16/20 1006 (!) 181/90     Pulse Rate 10/16/20 1006 64     Resp 10/16/20 1006 (!) 22     Temp 10/16/20 1006 98.3 F (36.8 C)     Temp Source 10/16/20 1006 Oral     SpO2 10/16/20 1006 98 %     Weight 10/16/20 1004 248 lb (112.5 kg)     Height 10/16/20 1004 6' (1.829 m)   Constitutional: Alert and oriented. Well appearing and in no acute distress. Eyes: Conjunctivae are normal.  Head: Atraumatic. Nose: No congestion/rhinnorhea. Mouth/Throat: Mucous membranes are moist.   Neck: No stridor. Cardiovascular: Normal rate, regular rhythm. Good peripheral circulation. Grossly normal heart sounds.   Respiratory: Normal respiratory effort.  No retractions. Lungs CTAB. Gastrointestinal: Soft and nontender. No distention.  Musculoskeletal: No lower extremity tenderness nor edema. No gross deformities of extremities. Neurologic:  Normal speech and language. No gross focal neurologic deficits are appreciated.  Skin:  Skin is warm, dry and intact. No rash noted.  ____________________________________________   LABS (all labs ordered are listed, but only abnormal  results are displayed)  Labs Reviewed  COMPREHENSIVE METABOLIC PANEL - Abnormal; Notable for the following components:      Result Value   Glucose, Bld 105 (*)    Creatinine, Ser 1.76 (*)    GFR, Estimated 40 (*)    All other components within normal limits  LIPASE, BLOOD  CBC WITH DIFFERENTIAL/PLATELET  TROPONIN I (HIGH SENSITIVITY)  TROPONIN I (HIGH SENSITIVITY)   ____________________________________________  EKG   EKG Interpretation  Date/Time:  Wednesday October 16 2020 10:11:37 EDT Ventricular Rate:  65 PR Interval:  214 QRS Duration: 105 QT Interval:  418 QTC Calculation: 435 R Axis:   -44 Text Interpretation: Sinus rhythm Borderline prolonged PR interval Incomplete RBBB and LAFB Confirmed by Nanda Quinton [phone]) on 10/16/2020 10:31:13 AM        ____________________________________________  RADIOLOGY  DG Chest Portable 1 View  Result Date: 10/16/2020 CLINICAL DATA:  Chest pain EXAM: PORTABLE CHEST 1 VIEW COMPARISON:  Chest radiograph 06/01/2019 FINDINGS: The cardiomediastinal silhouette is within normal limits. There is no focal consolidation or pulmonary edema. There is no pleural effusion or pneumothorax. There is no acute osseous abnormality. IMPRESSION: No radiographic evidence of acute cardiopulmonary process. Electronically Signed   By: Valetta Mole M.D.   On: 10/16/2020 11:09    ____________________________________________   PROCEDURES  Procedure(s) performed:   Procedures  None  ____________________________________________   INITIAL IMPRESSION / ASSESSMENT AND PLAN / ED COURSE  Pertinent labs & imaging results that were available during my care of the patient were reviewed by me and considered in my medical decision making (see chart  for details).   Patient presents to the emergency department with chest tightness and elevated blood pressures at home.  His initial blood pressure on arrival is systolic in the 500X but then down trended to the  160s.  In reviewing his blood pressure log at bedside he has occasional systolic blood pressures into the 170s but mainly seems to be in the 140 range.  He is not having stroke signs/symptoms.  Lower suspicion overall for hypertensive emergency.  EKG shows no acute ischemic change or STEMI.  Plan for trending troponins.  No abdominal pain symptoms or other GI causes for pain.   Differential includes all life-threatening causes for chest pain. This includes but is not exclusive to acute coronary syndrome, aortic dissection, pulmonary embolism, cardiac tamponade, community-acquired pneumonia, pericarditis, musculoskeletal chest wall pain, etc.   Baseline CKD noted. No electrolyte abnormality. Troponin negative x 2. Patient remains asymptomatic. He does follow with Cardiology for A-fib and BP mgmt. He will reach out by phone for ASAP follow up appointment and return to the ED with any new or suddenly worsening symptoms.  ____________________________________________  FINAL CLINICAL IMPRESSION(S) / ED DIAGNOSES  Final diagnoses:  Precordial chest pain     Note:  This document was prepared using Dragon voice recognition software and may include unintentional dictation errors.  Nanda Quinton, MD, [facility] Emergency [facility]    Gweneth Fredlund, [facility], MD 10/16/20 [phone]

## 2020-10-16 NOTE — Discharge Instructions (Signed)

## 2020-10-16 NOTE — ED Triage Notes (Signed)
Pt was woken up from sleep with midsternal chest pain and shortness of breath. States took BP and it was 161/112.

## 2020-10-17 ENCOUNTER — Encounter: Payer: Self-pay | Admitting: Family Medicine

## 2020-10-21 NOTE — Telephone Encounter (Signed)
Sent message to kim to schedule him

## 2020-10-25 ENCOUNTER — Other Ambulatory Visit: Payer: Self-pay

## 2020-10-25 ENCOUNTER — Telehealth (INDEPENDENT_AMBULATORY_CARE_PROVIDER_SITE_OTHER): Payer: Medicare Other | Admitting: Family Medicine

## 2020-10-25 VITALS — BP 135/88 | HR 54

## 2020-10-25 DIAGNOSIS — M25562 Pain in left knee: Secondary | ICD-10-CM

## 2020-10-25 DIAGNOSIS — M1990 Unspecified osteoarthritis, unspecified site: Secondary | ICD-10-CM

## 2020-10-25 DIAGNOSIS — C61 Malignant neoplasm of prostate: Secondary | ICD-10-CM

## 2020-10-25 DIAGNOSIS — I10 Essential (primary) hypertension: Secondary | ICD-10-CM

## 2020-10-25 DIAGNOSIS — F418 Other specified anxiety disorders: Secondary | ICD-10-CM

## 2020-10-25 DIAGNOSIS — M545 Low back pain, unspecified: Secondary | ICD-10-CM

## 2020-10-25 DIAGNOSIS — E782 Mixed hyperlipidemia: Secondary | ICD-10-CM

## 2020-10-25 DIAGNOSIS — N1831 Chronic kidney disease, stage 3a: Secondary | ICD-10-CM

## 2020-10-25 DIAGNOSIS — R739 Hyperglycemia, unspecified: Secondary | ICD-10-CM

## 2020-10-25 MED ORDER — ATENOLOL 50 MG PO TABS: 50.0000 mg | ORAL_TABLET | Freq: Two times a day (BID) | ORAL | 1 refills | Status: DC

## 2020-10-25 NOTE — Assessment & Plan Note (Signed)
Pain in b/l knees, hands, ankles worse with palpation. Knees are the worst and the pain is activated by touch. Xray of right knee shows chronic arthritic changes, will proceed with left knee xray. Use ice and topical treatments

## 2020-10-25 NOTE — Progress Notes (Signed)
Virtual telephone visit    Virtual Visit via Telephone Note   This visit type was conducted due to national recommendations for restrictions regarding the COVID-19 Pandemic (e.g. social distancing) in an effort to limit this patient's exposure and mitigate transmission in our community. Due to his co-morbid illnesses, this patient is at least at moderate risk for complications without adequate follow up. This format is felt to be most appropriate for this patient at this time. The patient did not have access to video technology or had technical difficulties with video requiring transitioning to audio format only (telephone). Physical exam was limited to content and character of the telephone converstion. S Chism, CMA was able to get the patient set up on a telephone visit.   Patient location: home Patient and provider in visit Provider location: Office  I discussed the limitations of evaluation and management by telemedicine and the availability of in person appointments. The patient expressed understanding and agreed to proceed.   Visit Date: 10/25/2020  Today's healthcare provider: Penni Homans, MD     Subjective:    Patient ID: David [name], male    DOB: 1945-03-10, 75 y.o.   MRN: [id number]  Chief Complaint  Patient presents with   BP and anxiety   prostate  change    HPI Patient is in today for discussion of numerous health concerns.  His first concern is his low back pain.  He notes he has been having trouble with midline low back pain to some degree since the summer when he moved some heavy furniture.  It is improving.  Most of the time it is tolerable and does not interrupt ADLs but on occasion he moves in just such a way that he gets a sharp shooting pain that radiates to his posterior right hip and down his right leg to his mid calf that pain is excruciating and has nearly caused him to fall at times.  No incontinence is noted.  He does have a reevaluation for his  rising PSA due to his history of prostate cancer arranged for the next week.  No fevers or chills.  No hematuria or dysuria.  He notes his fatigue is somewhat improved and his blood pressure is also improved.  He is now taking atenolol 25 mg 4 times daily with good results.  He never increased his fluoxetine to 10 mg 3 times daily but has remained at twice daily and his stress levels are very high.  His wife is very ill and having frequent trouble with falls, injury, depression and irritability.  He takes care of her daily and has a lot of stress as relates to that.  They also have financial stressors and he finds himself struggling with anhedonia. Denies CP/palp/SOB/HA/congestion/fevers/GI c/o. Taking meds as prescribed  Past Medical History:  Diagnosis Date   Adenoma of right adrenal gland 07/11/2002   2.4cm , noted on CT ABD   Allergy    Anxiety    Arthritis of both knees 03/26/2016   Astigmatism    Back pain    BCC (basal cell carcinoma of skin)    under right eye and right ear   Blood transfusion without reported diagnosis    Cataract 09/29/2016   Depression    Diverticulitis 2009   Diverticulosis    Family history of breast cancer    Family history of colon cancer    Fatty liver    GERD (gastroesophageal reflux disease)    Gout  Hearing loss of both ears 03/26/2016   History of atrial fibrillation    x 2 none since 2020   History of chronic prostatitis    started at age 61   History of kidney stones    Hyperglycemia    Hyperlipidemia    Hypertension    Incomplete right bundle branch block (RBBB)    Internal hemorrhoids    Kidney lesion 06/07/2015   Right midportion, 1.1x1.1 cm hyperechoic, noted on [place] ABD   LAFB (left anterior fascicular block)    Lipoma of axilla 09/2016   Liver lesion, right lobe 06/07/2015   2.5x2.4x2.4 cm hypoechoic lesion posterior aspect, noted on [place] ABD   Low back pain 03/26/2016   LVH (left ventricular hypertrophy) 08/06/2017   Moderate,  Noted on ECHO   Medicare annual wellness visit, subsequent 03/12/2014   OA (osteoarthritis)    Back, Hands, Neck   Obesity 11/25/2007   Qualifier: Diagnosis of  By: Lenna Gilford MD, Deborra Medina    Other malaise and fatigue 03/13/2013   Premature ventricular complex    Prostate cancer ([place]) dx 2021   Renal insufficiency    Kidney removed right   Scoliosis    Upper thoracic and lumbar   Sinus bradycardia 08/06/2017   Noted on EKG   Vitamin D deficiency    resolved    Past Surgical History:  Procedure Laterality Date   CARDIOVERSION  07/31/2017   CATARACT EXTRACTION, BILATERAL     COLON SURGERY  2009   segmental sigmoid resection   COLONOSCOPY     CYSTOSCOPY N/A 07/28/2019   Procedure: CYSTOSCOPY FLEXIBLE;  Surgeon: Cleon Gustin, MD;  Location: [facility];  Service: Urology;  Laterality: N/A;   CYSTOSCOPY W/ RETROGRADES Right 06/07/2018   Procedure: CYSTOSCOPY WITH RETROGRADE PYELOGRAM, uphrostogram;  Surgeon: Cleon Gustin, MD;  Location: [facility];  Service: Urology;  Laterality: Right;   CYSTOSCOPY WITH URETEROSCOPY AND STENT PLACEMENT Right 02/28/2018   Procedure: CYSTOSCOPY WITH URETEROSCOPY [place];  Surgeon: Kathie Rhodes, MD;  Location[facility];  Service: Urology;  Laterality: Right;   CYSTOSCOPY/URETEROSCOPY/HOLMIUM LASER/STENT PLACEMENT Right 11/29/2017   Procedure: CYSTOSCOPY/RETROGRADE/URETEROSCOPY/HOLMIUM LASER/STENT PLACEMENT;  Surgeon: Kathie Rhodes, MD;  Location: [facility];  Service: Urology;  Laterality: Right;   EYE SURGERY Bilateral 01/12/2017   cataract removal   history of blood tranfusion  age 85   HOLMIUM LASER APPLICATION Right 7/62/8315   Procedure: HOLMIUM LASER APPLICATION;  Surgeon: Kathie Rhodes, MD;  Location: [facility];  Service: Urology;  Laterality: Right;   IR BALLOON DILATION URETERAL STRICTURE RIGHT  03/14/2018   IR NEPHRO TUBE REMOV/FL  03/17/2018   IR NEPHROSTOGRAM RIGHT THRU EXISTING ACCESS   03/17/2018   IR NEPHROSTOMY EXCHANGE RIGHT  03/14/2018   IR NEPHROSTOMY EXCHANGE RIGHT  06/21/2018   IR NEPHROSTOMY PLACEMENT RIGHT  03/02/2018   IR NEPHROSTOMY PLACEMENT RIGHT  04/20/2018   IR URETERAL STENT PLACEMENT EXISTING ACCESS RIGHT  03/14/2018   NOSE SURGERY     Submucous resection age 21   NUCLEAR STRESS TEST  06/03/2009   RADIOACTIVE SEED IMPLANT N/A 07/28/2019   Procedure: RADIOACTIVE SEED IMPLANT/BRACHYTHERAPY IMPLANT;  Surgeon: Cleon Gustin, MD;  Location: [facility];  Service: Urology;  Laterality: N/A;  90 MINS   ROBOT ASSISTED LAPAROSCOPIC NEPHRECTOMY Right 08/04/2018   Procedure: XI ROBOTIC ASSISTED LAPAROSCOPIC NEPHRECTOMY;  Surgeon: Cleon Gustin, MD;  Location: [facility];  Service: Urology;  Laterality: Right;  3 HRS   ROBOT  ASSISTED PYELOPLASTY Right 06/07/2018   Procedure: attempted XI ROBOTIC ASSISTED PYELOPLASTY, lysis of adhesions;  Surgeon: Cleon Gustin, MD;  Location: [facility];  Service: Urology;  Laterality: Right;  3 HRS   SKIN BIOPSY     SPACE OAR INSTILLATION N/A 07/28/2019   Procedure: SPACE OAR INSTILLATION;  Surgeon: Cleon Gustin, MD;  Location: [facility];  Service: Urology;  Laterality: N/A;   URETEROSCOPY WITH HOLMIUM LASER LITHOTRIPSY Right 09/24/2017   Procedure: CYSTOSCOPY, URETEROSCOPY WITH HOLMIUM LASER LITHOTRIPSY, STENT PLACEMENT;  Surgeon: Kathie Rhodes, MD;  Location: [facility];  Service: Urology;  Laterality: Right;    Family History  Problem Relation Age of Onset   Heart disease Mother    Hypertension Mother    Stroke Mother    Colon cancer Mother 35   Breast cancer Mother 31   Heart disease Father        pacemaker   Aortic aneurysm Father    Hypertension Father    Lung cancer Father 64   Heart disease Sister    Atrial fibrillation Sister    Obesity Sister    Sleep apnea Sister    Heart attack Brother    Other Brother        muscle disease   Arthritis Brother    Stroke  Brother    Atrial fibrillation Brother    Heart attack Brother    Atrial fibrillation Brother    Diabetes Brother    Atrial fibrillation Brother    Heart attack Brother    Hypertension Brother    Hyperlipidemia Brother    Heart attack Brother    Other Brother        heart valve operation   Atrial fibrillation Brother    Heart attack Maternal Grandmother    Diabetes Maternal Grandmother    Cancer Maternal Grandfather 42       hodgin's lymphoma   Heart attack Paternal Grandmother    Anxiety disorder Paternal Grandmother    Pneumonia Paternal Grandfather    Liver cancer Nephew 2       great nephew; d. 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   2 alcoholic beverages most days.   1 caffeinated beverage daily   No drug use no current tobacco use he  is a prior smoker   12/24/2016   Social Determinants of Health   Financial Resource Strain: Low Risk    Difficulty of Paying Living Expenses: Not very hard  Food Insecurity: No Food Insecurity   Worried About Charity fundraiser in the Last Year:  Never true   Ran Out of Food in the Last Year: Never true  Transportation Needs: No Transportation Needs   Lack of Transportation (Medical): No   Lack of Transportation (Non-Medical): No  Physical Activity: Insufficiently Active   Days of Exercise per Week: 3 days   Minutes of Exercise per Session: 30 min  Stress: No Stress Concern Present   Feeling of Stress : Only a little  Social Connections: Moderately Isolated   Frequency of Communication with Friends and Family: More than three times a week   Frequency of Social Gatherings with Friends and Family: Once a week   Attends Religious Services: Never   Marine scientist or Organizations: No   Attends Music therapist: Never   Marital Status: Married  Human resources officer Violence: Not At Risk   Fear of Current or Ex-Partner: No   Emotionally Abused: No   Physically Abused: No   Sexually Abused: No    Outpatient Medications Prior to Visit  Medication Sig Dispense Refill   apixaban (ELIQUIS) 5 MG TABS tablet TAKE ONE TABLET BY MOUTH TWICE A DAY 180 tablet 1   ascorbic acid (VITAMIN C) 1000 MG tablet Take by mouth.     atorvastatin (LIPITOR) 10 MG tablet TAKE ONE TABLET BY MOUTH EVERY MORNING 90 tablet 1   clotrimazole-betamethasone (LOTRISONE) lotion Apply topically 2 (two) times daily. For 2 weeks. 30 mL 0   colchicine 0.6 MG tablet For gout flares, take 1 tab po once then repeat in 1 hour as needed if pain persists up to a total of 3 doses 15 tablet 0   FLUoxetine (PROZAC) 10 MG tablet Take 1 tablet (10 mg total) by mouth 3 (three) times daily. 270 tablet 1   LORazepam (ATIVAN) 1 MG tablet TAKE ONE TABLET BY MOUTH TWICE A DAY AS NEEDED FOR ANXIETY OR SEDATION 180 tablet 2   omeprazole (PRILOSEC) 20 MG capsule Take 20 mg by mouth daily as needed (acid reflux).      atenolol (TENORMIN) 25 MG tablet TAKE ONE TABLET BY MOUTH EVERY MORNING, ONE TABLET AT NOON, AND ONE TABLET EVERY NIGHT AT BEDTIME 270 tablet 1   NYAMYC  powder Apply topically.     No facility-administered medications prior to visit.    Allergies  Allergen Reactions   Cefaclor Hives   Cephalosporins Hives   Penicillins Rash    Mild maculopapular rash Has patient had a PCN reaction causing immediate rash, facial/tongue/throat swelling, SOB or lightheadedness with hypotension: No Has patient had a PCN reaction causing severe rash involving mucus membranes or skin necrosis: No Has patient had a PCN reaction that required hospitalization: No Has patient had a PCN reaction occurring within the last 10 years: No If all of the above answers are "NO", then may proceed with Cephalosporin use.     Review of Systems  Constitutional:  Positive for malaise/fatigue. Negative for fever.  HENT:  Negative for congestion.   Eyes:  Negative for blurred vision.  Respiratory:  Negative for shortness of breath.   Cardiovascular:  Negative for chest pain, palpitations and leg swelling.  Gastrointestinal:  Negative for abdominal pain, blood in stool and nausea.  Genitourinary:  Positive for  frequency. Negative for dysuria and hematuria.  Musculoskeletal:  Positive for back pain, joint pain and myalgias. Negative for falls.  Skin:  Negative for rash.  Neurological:  Negative for dizziness, loss of consciousness and headaches.  Endo/Heme/Allergies:  Negative for environmental allergies.  Psychiatric/Behavioral:  Positive for depression. The patient is nervous/anxious.       Objective:    Physical Exam unable to obtain via phone visit  BP 135/88   Pulse (!) 54  Wt Readings from Last 3 Encounters:  10/16/20 248 lb (112.5 kg)  09/23/20 248 lb (112.5 kg)  09/15/20 249 lb (112.9 kg)    Diabetic Foot Exam - Simple   No data filed    Lab Results  Component Value Date   WBC 7.0 10/16/2020   HGB 14.2 10/16/2020   HCT 42.2 10/16/2020   PLT 195 10/16/2020   GLUCOSE 105 (H) 10/16/2020   CHOL 155 08/05/2020   TRIG 116.0 08/05/2020   HDL 38.00 (L)  08/05/2020   LDLDIRECT 168.5 03/29/2006   LDLCALC 93 08/05/2020   ALT 23 10/16/2020   AST 23 10/16/2020   NA 137 10/16/2020   K 3.9 10/16/2020   CL 104 10/16/2020   CREATININE 1.76 (H) 10/16/2020   BUN 20 10/16/2020   CO2 26 10/16/2020   TSH 3.07 08/05/2020   PSA 3.62 08/05/2020   INR 1.0 07/25/2019   HGBA1C 5.7 08/05/2020    Lab Results  Component Value Date   TSH 3.07 08/05/2020   Lab Results  Component Value Date   WBC 7.0 10/16/2020   HGB 14.2 10/16/2020   HCT 42.2 10/16/2020   MCV 92.3 10/16/2020   PLT 195 10/16/2020   Lab Results  Component Value Date   NA 137 10/16/2020   K 3.9 10/16/2020   CO2 26 10/16/2020   GLUCOSE 105 (H) 10/16/2020   BUN 20 10/16/2020   CREATININE 1.76 (H) 10/16/2020   BILITOT 0.6 10/16/2020   ALKPHOS 80 10/16/2020   AST 23 10/16/2020   ALT 23 10/16/2020   PROT 6.9 10/16/2020   ALBUMIN 3.9 10/16/2020   CALCIUM 9.8 10/16/2020   ANIONGAP 7 10/16/2020   GFR 37.18 (L) 08/05/2020   Lab Results  Component Value Date   CHOL 155 08/05/2020   Lab Results  Component Value Date   HDL 38.00 (L) 08/05/2020   Lab Results  Component Value Date   LDLCALC 93 08/05/2020   Lab Results  Component Value Date   TRIG 116.0 08/05/2020   Lab Results  Component Value Date   CHOLHDL 4 08/05/2020   Lab Results  Component Value Date   HGBA1C 5.7 08/05/2020       Assessment & Plan:   Problem List Items Addressed This Visit     Osteoarthritis    Pain in b/l knees, hands, ankles worse with palpation. Knees are the worst and the pain is activated by touch. Xray of right knee shows chronic arthritic changes, will proceed with left knee xray. Use ice and topical treatments      Hyperlipidemia, mixed    Tolerating statin, encouraged heart healthy diet, avoid trans fats, minimize simple carbs and saturated fats. Increase exercise as tolerated      Relevant Medications   atenolol (TENORMIN) 50 MG tablet   Essential hypertension, benign     Well controlled, no changes to meds. Encouraged heart healthy diet such as the DASH diet and exercise as tolerated.       Relevant Medications   atenolol (TENORMIN)  50 MG tablet   Depression with anxiety    Still struggling with increased stress due to health and home life concerns such as his wife's health and selling his house and moving. He had not increased his Fluoxetine to 10 mg to tid so he will do that now.       Low back pain    Encouraged moist heat and gentle stretching as tolerated. May try NSAIDs and prescription meds as directed and report if symptoms worsen or seek immediate care. Midline to posterior right hip. Started after moving furniture it is slowly improving. Rarely radiates to right leg down to calf and in those. Referred for physical therapy      Relevant Orders   Ambulatory referral to Physical Therapy   Hyperglycemia    hgba1c acceptable, minimize simple carbs. Increase exercise as tolerated      Malignant neoplasm of prostate [facility])    He has had a slightly increase in his PSA despite treatment. Still within normal limits but trending up so he is set up for further imaging and evaluation at [facility] where he is being treated.       Stage 3a chronic kidney disease ([place])    Hydrate and monitor      Other Visit Diagnoses     Left knee pain, unspecified chronicity    -  Primary   Relevant Orders   DG Knee Complete 4 Views Left       I have discontinued David [name] "Fred"'s atenolol. I am also having him start on atenolol. Additionally, I am having him maintain his omeprazole, ascorbic acid, clotrimazole-betamethasone, colchicine, LORazepam, Eliquis, FLUoxetine, atorvastatin, and Nyamyc.  Meds ordered this encounter  Medications   atenolol (TENORMIN) 50 MG tablet    Sig: Take 1 tablet (50 mg total) by mouth 2 (two) times daily.    Dispense:  180 tablet    Refill:  1     I discussed the assessment and treatment plan with the patient.  The patient was provided an opportunity to ask questions and all were answered. The patient agreed with the plan and demonstrated an understanding of the instructions.   The patient was advised to call back or seek an in-person evaluation if the symptoms worsen or if the condition fails to improve as anticipated.  I provided 31 minutes of non-face-to-face time during this encounter.   Penni Homans, MD [facility] [phone] (phone) [phone] (fax)  [place]

## 2020-10-25 NOTE — Assessment & Plan Note (Addendum)
Encouraged moist heat and gentle stretching as tolerated. May try NSAIDs and prescription meds as directed and report if symptoms worsen or seek immediate care. Midline to posterior right hip. Started after moving furniture it is slowly improving. Rarely radiates to right leg down to calf and in those. Referred for physical therapy

## 2020-10-25 NOTE — Assessment & Plan Note (Signed)
Tolerating statin, encouraged heart healthy diet, avoid trans fats, minimize simple carbs and saturated fats. Increase exercise as tolerated 

## 2020-10-25 NOTE — Assessment & Plan Note (Addendum)
Still struggling with increased stress due to health and home life concerns such as his wife's health and selling his house and moving. He had not increased his Fluoxetine to 10 mg to tid so he will do that now.

## 2020-10-25 NOTE — Assessment & Plan Note (Signed)
Well controlled, no changes to meds. Encouraged heart healthy diet such as the DASH diet and exercise as tolerated.  °

## 2020-10-25 NOTE — Assessment & Plan Note (Signed)
Hydrate and monitor 

## 2020-10-25 NOTE — Assessment & Plan Note (Signed)
hgba1c acceptable, minimize simple carbs. Increase exercise as tolerated.  

## 2020-10-25 NOTE — Assessment & Plan Note (Signed)
He has had a slightly increase in his PSA despite treatment. Still within normal limits but trending up so he is set up for further imaging and evaluation at [facility] where he is being treated.

## 2020-10-29 ENCOUNTER — Other Ambulatory Visit (HOSPITAL_COMMUNITY): Payer: Self-pay | Admitting: Urology

## 2020-10-29 ENCOUNTER — Other Ambulatory Visit: Payer: Self-pay

## 2020-10-29 ENCOUNTER — Encounter (HOSPITAL_BASED_OUTPATIENT_CLINIC_OR_DEPARTMENT_OTHER): Payer: Self-pay

## 2020-10-29 ENCOUNTER — Ambulatory Visit (HOSPITAL_BASED_OUTPATIENT_CLINIC_OR_DEPARTMENT_OTHER)
Admission: RE | Admit: 2020-10-29 | Discharge: 2020-10-29 | Disposition: A | Discharge status: Home / Self Care | Payer: Medicare Other | Source: Ambulatory Visit | Attending: Cardiology | Admitting: Cardiology

## 2020-10-29 DIAGNOSIS — I712 Thoracic aortic aneurysm, without rupture, unspecified: Secondary | ICD-10-CM | POA: Insufficient documentation

## 2020-10-29 DIAGNOSIS — R9721 Rising PSA following treatment for malignant neoplasm of prostate: Secondary | ICD-10-CM

## 2020-10-29 MED ORDER — IOHEXOL 350 MG/ML SOLN
100.0000 mL | Freq: Once | INTRAVENOUS | Status: AC | PRN
  Administered 2020-10-29: 100 mL via INTRAVENOUS

## 2020-11-04 ENCOUNTER — Ambulatory Visit (INDEPENDENT_AMBULATORY_CARE_PROVIDER_SITE_OTHER): Payer: Medicare Other | Admitting: Family Medicine

## 2020-11-04 ENCOUNTER — Other Ambulatory Visit: Payer: Self-pay

## 2020-11-04 ENCOUNTER — Encounter: Payer: Self-pay | Admitting: Family Medicine

## 2020-11-04 VITALS — BP 122/68 | HR 56 | Temp 98.1°F | Resp 16 | Wt 251.0 lb

## 2020-11-04 DIAGNOSIS — B356 Tinea cruris: Secondary | ICD-10-CM

## 2020-11-04 DIAGNOSIS — M542 Cervicalgia: Secondary | ICD-10-CM | POA: Insufficient documentation

## 2020-11-04 DIAGNOSIS — B379 Candidiasis, unspecified: Secondary | ICD-10-CM

## 2020-11-04 DIAGNOSIS — I10 Essential (primary) hypertension: Secondary | ICD-10-CM

## 2020-11-04 DIAGNOSIS — M791 Myalgia, unspecified site: Secondary | ICD-10-CM | POA: Diagnosis not present

## 2020-11-04 DIAGNOSIS — R739 Hyperglycemia, unspecified: Secondary | ICD-10-CM

## 2020-11-04 DIAGNOSIS — R21 Rash and other nonspecific skin eruption: Secondary | ICD-10-CM

## 2020-11-04 DIAGNOSIS — E782 Mixed hyperlipidemia: Secondary | ICD-10-CM

## 2020-11-04 DIAGNOSIS — F418 Other specified anxiety disorders: Secondary | ICD-10-CM

## 2020-11-04 DIAGNOSIS — L280 Lichen simplex chronicus: Secondary | ICD-10-CM

## 2020-11-04 DIAGNOSIS — M109 Gout, unspecified: Secondary | ICD-10-CM

## 2020-11-04 DIAGNOSIS — M2550 Pain in unspecified joint: Secondary | ICD-10-CM

## 2020-11-04 MED ORDER — FLUCONAZOLE 150 MG PO TABS: 150.0000 mg | ORAL_TABLET | Freq: Once | ORAL | 0 refills | Status: AC

## 2020-11-04 MED ORDER — CLOTRIMAZOLE-BETAMETHASONE 1-0.05 % EX LOTN: TOPICAL_LOTION | Freq: Two times a day (BID) | CUTANEOUS | 0 refills | Status: DC

## 2020-11-04 NOTE — Patient Instructions (Addendum)
60 to 80 ounces of fluids daily  Try a spray of hydrogen peroxide to each ear after the shower for itching.  Get the new COVID bivalent vaccine downstairs  Continue lean proteins and increased water intMuscle Pain, Adult Muscle pain, also called myalgia, is a condition in which a person has pain in one or more muscles in the body. Muscle pain may be mild, moderate, or severe. It may feel sharp, achy, or burning. In most cases, the pain lasts only a short time and goes away without treatment. Muscle pain can result from using muscles in a new or different way or after a period of inactivity. It is normal to feel some muscle pain after starting an exercise program. Muscles that have not been used often will be sore at first. What are the causes? This condition is caused by using muscles in a new or different way after a period of inactivity. Other causes may include: Overuse or muscle strain, especially if you are not in shape. This is the most common cause of muscle pain. Injury or bruising. Infectious diseases, including diseases caused by viruses, such as the flu (influenza). Fibromyalgia.This is a long-term, or chronic, condition that causes muscle tenderness, tiredness (fatigue), and headache. Autoimmune or rheumatologic diseases. These are conditions, such as lupus, in which the body's defense system (immunesystem) attacks areas in the body. Certain medicines, including ACE inhibitors and statins. What are the signs or symptoms? The main symptom of this condition is sore or painful muscles, including during activity and when stretching. You may also have slight swelling. How is this diagnosed? This condition is diagnosed with a physical exam. Your health care provider will ask questions about your pain and when it began. If you have not had muscle pain for very long, your health care provider may want to wait before doing much testing. If your muscle pain has lasted a long time, tests may be  done right away. In some cases, this may include tests to rule out certain conditions or illnesses. How is this treated? Treatment for this condition depends on the cause. Home care is often enough to relieve muscle pain. Your health care provider may also prescribe NSAIDs, such as ibuprofen. Follow these instructions at home: Medicines Take over-the-counter and prescription medicines only as told by your health care provider. Ask your health care provider if the medicine prescribed to you requires you to avoid driving or using machinery. Managing pain, swelling, and discomfort   If directed, put ice on the painful area. To do this: Put ice in a plastic bag. Place a towel between your skin and the bag. Leave the ice on for 20 minutes, 2-3 times a day. For the first 2 days of muscle soreness, or if there is swelling: Do not soak in hot baths. Do not use a hot tub, steam room, sauna, heating pad, or other heat source. After 48-72 hours, you may alternate between applying ice and applying heat as told by your health care provider. If directed, apply heat to the affected area as often as told by your health care provider. Use the heat source that your health care provider recommends, such as a moist heat pack or a heating pad. Place a towel between your skin and the heat source. Leave the heat on for 20-30 minutes. Remove the heat if your skin turns bright red. This is especially important if you are unable to feel pain, heat, or cold. You may have a greater risk of getting  burned. If you have an injury, raise (elevate) the injured area above the level of your heart while you are sitting or lying down. Activity  If overuse is causing your muscle pain: Slow down your activities until the pain goes away. Do regular, gentle exercises if you are not usually active. Warm up before exercising. Stretch before and after exercising. This can help lower the risk of muscle pain. Do not continue working  out if the pain is severe. Severe pain could mean that you have injured a muscle. Do not lift anything that is heavier than 5-10 lb (2.3-4.5 kg), or the limit that you are told, until your health care provider says that it is safe. Return to your normal activities as told by your health care provider. Ask your health care provider what activities are safe for you. General instructions Do not use any products that contain nicotine or tobacco, such as cigarettes, e-cigarettes, and chewing tobacco. These can delay healing. If you need help quitting, ask your health care provider. Keep all follow-up visits as told by your health care provider. This is important. Contact a health care provider if you have: Muscle pain that gets worse and medicines do not help. Muscle pain that lasts longer than 3 days. A rash or fever along with muscle pain. Muscle pain after a tick bite. Muscle pain while working out, even though you are in good physical condition. Redness, soreness, or swelling along with muscle pain. Muscle pain after starting a new medicine or changing the dose of a medicine. Get help right away if you have: Trouble breathing. Trouble swallowing. Muscle pain along with a stiff neck, fever, and vomiting. Severe muscle weakness or you cannot move part of your body. These symptoms may represent a serious problem that is an emergency. Do not wait to see if the symptoms will go away. Get medical help right away. Call your local emergency services (911 in the [place] Do not drive yourself to the hospital. Summary Muscle pain usually lasts only a short time and goes away without treatment. This condition is caused by using muscles in a new or different way after a period of inactivity. If your muscle pain lasts longer than 3 days, tell your health care provider. This information is not intended to replace advice given to you by your health care provider. Make sure you discuss any questions you have  with your health care provider. Document Revised: 10/07/2018 Document Reviewed: 10/07/2018 Elsevier Patient Education  [place] 60-80 ounces daily Get new

## 2020-11-04 NOTE — Assessment & Plan Note (Signed)
Well controlled, no changes to meds. Encouraged heart healthy diet such as the DASH diet and exercise as tolerated.  °

## 2020-11-04 NOTE — Progress Notes (Signed)
Subjective:   By signing my name below, I, Zite Okoli, attest that this documentation has been prepared under the direction and in the presence of Mosie Lukes, MD. 11/04/2020   Patient ID: David [name], male    DOB: 10/08/1945, 75 y.o.   MRN: [id number]  Chief Complaint  Patient presents with   Generalized Body Aches    HPI Patient is in today for an office visit.  He reports having total body and muscle aches headaches, neck pain. Notes that when he tried to sleep last night, he could not lie down because it was hurting. He denies redness and warmth around his joints.  He reports he has been itching around his eyes, nostrils,ears and lips . His penis also itches but is not swollen. He has been using witch hazel to manage the itching on his penis but  it provides little relief.  He also reports experiencing memory loss but is under a lot of atressv  at home.  He has an MRI scheduled tomorrow to scan from his head to mid-thigh.  At his last visit to his urologist, it was noticed that his PSA levels had increased.  He has been managing a healthy diet with his wife eating a lot of vegetables and less red meat. He regularly exercises by walking 3-4 miles a week. He is drinking less caffeinated drinks and is trying to drink more water.  His wife has some medical issues and he is having a hard time taking care of her and relating to her.  He has received his flu vaccine.He has 4 Covid-19 vaccines at this time.  Past Medical History:  Diagnosis Date   Adenoma of right adrenal gland 07/11/2002   2.4cm , noted on CT ABD   Allergy    Anxiety    Arthritis of both knees 03/26/2016   Astigmatism    Back pain    BCC (basal cell carcinoma of skin)    under right eye and right ear   Blood transfusion without reported diagnosis    Cataract 09/29/2016   Depression    Diverticulitis 2009   Diverticulosis    Family history of breast cancer    Family history of colon cancer     Fatty liver    GERD (gastroesophageal reflux disease)    Gout    Hearing loss of both ears 03/26/2016   History of atrial fibrillation    x 2 none since 2020   History of chronic prostatitis    started at age 54   History of kidney stones    Hyperglycemia    Hyperlipidemia    Hypertension    Incomplete right bundle branch block (RBBB)    Internal hemorrhoids    Kidney lesion 06/07/2015   Right midportion, 1.1x1.1 cm hyperechoic, noted on [place] ABD   LAFB (left anterior fascicular block)    Lipoma of axilla 09/2016   Liver lesion, right lobe 06/07/2015   2.5x2.4x2.4 cm hypoechoic lesion posterior aspect, noted on [place] ABD   Low back pain 03/26/2016   LVH (left ventricular hypertrophy) 08/06/2017   Moderate, Noted on ECHO   Medicare annual wellness visit, subsequent 03/12/2014   OA (osteoarthritis)    Back, Hands, Neck   Obesity 11/25/2007   Qualifier: Diagnosis of  By: Lenna Gilford MD, Deborra Medina    Other malaise and fatigue 03/13/2013   Premature ventricular complex    Prostate cancer ([place]) dx 2021   Renal insufficiency    Kidney removed  right   Scoliosis    Upper thoracic and lumbar   Sinus bradycardia 08/06/2017   Noted on EKG   Vitamin D deficiency    resolved    Past Surgical History:  Procedure Laterality Date   CARDIOVERSION  07/31/2017   CATARACT EXTRACTION, BILATERAL     COLON SURGERY  2009   segmental sigmoid resection   COLONOSCOPY     CYSTOSCOPY N/A 07/28/2019   Procedure: CYSTOSCOPY FLEXIBLE;  Surgeon: Cleon Gustin, MD;  Location[facility];  Service: Urology;  Laterality: N/A;   CYSTOSCOPY W/ RETROGRADES Right 06/07/2018   Procedure: CYSTOSCOPY WITH RETROGRADE PYELOGRAM, uphrostogram;  Surgeon: Cleon Gustin, MD;  Location: [facility];  Service: Urology;  Laterality: Right;   CYSTOSCOPY WITH URETEROSCOPY AND STENT PLACEMENT Right 02/28/2018   Procedure: CYSTOSCOPY WITH URETEROSCOPY [place];  Surgeon: Kathie Rhodes, MD;  Location: [facility];  Service: Urology;  Laterality: Right;   CYSTOSCOPY/URETEROSCOPY/HOLMIUM LASER/STENT PLACEMENT Right 11/29/2017   Procedure: CYSTOSCOPY/RETROGRADE/URETEROSCOPY/HOLMIUM LASER/STENT PLACEMENT;  Surgeon: Kathie Rhodes, MD;  Location: [facility];  Service: Urology;  Laterality: Right;   EYE SURGERY Bilateral 01/12/2017   cataract removal   history of blood tranfusion  age 58   HOLMIUM LASER APPLICATION Right 8/75/6433   Procedure: HOLMIUM LASER APPLICATION;  Surgeon: Kathie Rhodes, MD;  Location: [facility];  Service: Urology;  Laterality: Right;   IR BALLOON DILATION URETERAL STRICTURE RIGHT  03/14/2018   IR NEPHRO TUBE REMOV/FL  03/17/2018   IR NEPHROSTOGRAM RIGHT THRU EXISTING ACCESS  03/17/2018   IR NEPHROSTOMY EXCHANGE RIGHT  03/14/2018   IR NEPHROSTOMY EXCHANGE RIGHT  06/21/2018   IR NEPHROSTOMY PLACEMENT RIGHT  03/02/2018   IR NEPHROSTOMY PLACEMENT RIGHT  04/20/2018   IR URETERAL STENT PLACEMENT EXISTING ACCESS RIGHT  03/14/2018   NOSE SURGERY     Submucous resection age 1   NUCLEAR STRESS TEST  06/03/2009   RADIOACTIVE SEED IMPLANT N/A 07/28/2019   Procedure: RADIOACTIVE SEED IMPLANT/BRACHYTHERAPY IMPLANT;  Surgeon: Cleon Gustin, MD;  Location: [facility];  Service: Urology;  Laterality: N/A;  90 MINS   ROBOT ASSISTED LAPAROSCOPIC NEPHRECTOMY Right 08/04/2018   Procedure: XI ROBOTIC ASSISTED LAPAROSCOPIC NEPHRECTOMY;  Surgeon: Cleon Gustin, MD;  Location: [facility];  Service: Urology;  Laterality: Right;  3 HRS   ROBOT ASSISTED PYELOPLASTY Right 06/07/2018   Procedure: attempted XI ROBOTIC ASSISTED PYELOPLASTY, lysis of adhesions;  Surgeon: Cleon Gustin, MD;  Location: [facility];  Service: Urology;  Laterality: Right;  3 HRS   SKIN BIOPSY     SPACE OAR INSTILLATION N/A 07/28/2019   Procedure: SPACE OAR INSTILLATION;  Surgeon: Cleon Gustin, MD;  Location: [facility];  Service: Urology;  Laterality: N/A;   URETEROSCOPY  WITH HOLMIUM LASER LITHOTRIPSY Right 09/24/2017   Procedure: CYSTOSCOPY, URETEROSCOPY WITH HOLMIUM LASER LITHOTRIPSY, STENT PLACEMENT;  Surgeon: Kathie Rhodes, MD;  Location: [facility];  Service: Urology;  Laterality: Right;    Family History  Problem Relation Age of Onset   Heart disease Mother    Hypertension Mother    Stroke Mother    Colon cancer Mother 64   Breast cancer Mother 18   Heart disease Father        pacemaker   Aortic aneurysm Father    Hypertension Father    Lung cancer Father 73   Heart disease Sister    Atrial fibrillation Sister    Obesity Sister    Sleep  apnea Sister    Heart attack Brother    Other Brother        muscle disease   Arthritis Brother    Stroke Brother    Atrial fibrillation Brother    Heart attack Brother    Atrial fibrillation Brother    Diabetes Brother    Atrial fibrillation Brother    Heart attack Brother    Hypertension Brother    Hyperlipidemia Brother    Heart attack Brother    Other Brother        heart valve operation   Atrial fibrillation Brother    Heart attack Maternal Grandmother    Diabetes Maternal Grandmother    Cancer Maternal Grandfather 44       hodgin's lymphoma   Heart attack Paternal Grandmother    Anxiety disorder Paternal Grandmother    Pneumonia Paternal Grandfather    Liver cancer Nephew 2       great nephew; d. 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   2 alcoholic beverages most days.   1 caffeinated beverage daily   No drug use no current tobacco use he is a prior smoker   12/24/2016   Social Determinants of Health   Financial Resource Strain: Low Risk    Difficulty of Paying Living Expenses: Not very hard  Food Insecurity: No Food Insecurity   Worried About Charity fundraiser in the Last Year: Never true   Ran Out of Food in the Last Year: Never true  Transportation Needs: No Transportation Needs   Lack of Transportation (Medical): No   Lack of Transportation (Non-Medical): No  Physical Activity: Insufficiently Active   Days of Exercise per Week: 3 days   Minutes of Exercise per Session: 30 min  Stress: No Stress Concern Present   Feeling of Stress : Only a little  Social Connections: Moderately Isolated   Frequency of Communication with Friends and Family: More than three times a week   Frequency of Social Gatherings with Friends and Family: Once a week   Attends Religious Services: Never   Marine scientist or Organizations: No   Attends Archivist Meetings: Never   Marital Status: Married  Human resources officer Violence: Not At Risk   Fear of Current or Ex-Partner: No  Emotionally Abused: No   Physically Abused: No   Sexually Abused: No    Outpatient Medications Prior to Visit  Medication Sig Dispense Refill   apixaban (ELIQUIS) 5 MG TABS tablet TAKE ONE TABLET BY MOUTH TWICE A DAY 180 tablet 1   ascorbic acid (VITAMIN C) 1000 MG tablet Take by mouth.     atenolol (TENORMIN) 50 MG tablet Take 1 tablet (50 mg total) by mouth 2 (two) times daily. 180  tablet 1   atorvastatin (LIPITOR) 10 MG tablet TAKE ONE TABLET BY MOUTH EVERY MORNING 90 tablet 1   colchicine 0.6 MG tablet For gout flares, take 1 tab po once then repeat in 1 hour as needed if pain persists up to a total of 3 doses 15 tablet 0   FLUoxetine (PROZAC) 10 MG tablet Take 1 tablet (10 mg total) by mouth 3 (three) times daily. 270 tablet 1   LORazepam (ATIVAN) 1 MG tablet TAKE ONE TABLET BY MOUTH TWICE A DAY AS NEEDED FOR ANXIETY OR SEDATION 180 tablet 2   NYAMYC powder Apply topically.     omeprazole (PRILOSEC) 20 MG capsule Take 20 mg by mouth daily as needed (acid reflux).      clotrimazole-betamethasone (LOTRISONE) lotion Apply topically 2 (two) times daily. For 2 weeks. 30 mL 0   No facility-administered medications prior to visit.    Allergies  Allergen Reactions   Cefaclor Hives   Cephalosporins Hives   Penicillins Rash    Mild maculopapular rash Has patient had a PCN reaction causing immediate rash, facial/tongue/throat swelling, SOB or lightheadedness with hypotension: No Has patient had a PCN reaction causing severe rash involving mucus membranes or skin necrosis: No Has patient had a PCN reaction that required hospitalization: No Has patient had a PCN reaction occurring within the last 10 years: No If all of the above answers are "NO", then may proceed with Cephalosporin use.     Review of Systems  Constitutional:  Negative for fever and malaise/fatigue.  HENT:  Negative for congestion.   Eyes:  Negative for redness.  Respiratory:  Negative for shortness of breath.   Cardiovascular:  Negative for chest pain, palpitations and leg swelling.  Gastrointestinal:  Negative for abdominal pain, blood in stool and nausea.  Genitourinary:  Negative for dysuria and frequency.  Musculoskeletal:  Positive for myalgias (total body) and neck pain. Negative for falls.  Skin:  Positive for itching (face and penis). Negative for rash.  Neurological:  Negative for dizziness,  loss of consciousness and headaches.  Endo/Heme/Allergies:  Negative for polydipsia.  Psychiatric/Behavioral:  Negative for depression. The patient is not nervous/anxious.       Objective:    Physical Exam Constitutional:      Appearance: Normal appearance. He is not ill-appearing.  HENT:     Head: Normocephalic and atraumatic.     Right Ear: Tympanic membrane, ear canal and external ear normal.     Left Ear: Tympanic membrane, ear canal and external ear normal.  Eyes:     Conjunctiva/sclera: Conjunctivae normal.  Cardiovascular:     Rate and Rhythm: Normal rate and regular rhythm.     Heart sounds: Normal heart sounds. No murmur heard. Pulmonary:     Breath sounds: Normal breath sounds. No wheezing.  Abdominal:     General: Bowel sounds are normal. There is no distension.     Palpations: Abdomen is soft.     Tenderness: There is no abdominal tenderness.     Hernia: No hernia  is present.  Musculoskeletal:     Cervical back: Neck supple.  Lymphadenopathy:     Cervical: No cervical adenopathy.  Skin:    General: Skin is warm and dry.  Neurological:     Mental Status: He is alert and oriented to person, place, and time.  Psychiatric:        Behavior: Behavior normal.    BP 122/68   Pulse (!) 56   Temp 98.1 F (36.7 C)   Resp 16   Wt 251 lb (113.9 kg)   SpO2 95%   BMI 34.04 kg/m  Wt Readings from Last 3 Encounters:  11/04/20 251 lb (113.9 kg)  10/16/20 248 lb (112.5 kg)  09/23/20 248 lb (112.5 kg)    Diabetic Foot Exam - Simple   No data filed    Lab Results  Component Value Date   WBC 7.0 10/16/2020   HGB 14.2 10/16/2020   HCT 42.2 10/16/2020   PLT 195 10/16/2020   GLUCOSE 105 (H) 10/16/2020   CHOL 155 08/05/2020   TRIG 116.0 08/05/2020   HDL 38.00 (L) 08/05/2020   LDLDIRECT 168.5 03/29/2006   LDLCALC 93 08/05/2020   ALT 23 10/16/2020   AST 23 10/16/2020   NA 137 10/16/2020   K 3.9 10/16/2020   CL 104 10/16/2020   CREATININE 1.76 (H) 10/16/2020    BUN 20 10/16/2020   CO2 26 10/16/2020   TSH 3.07 08/05/2020   PSA 3.62 08/05/2020   INR 1.0 07/25/2019   HGBA1C 5.7 08/05/2020    Lab Results  Component Value Date   TSH 3.07 08/05/2020   Lab Results  Component Value Date   WBC 7.0 10/16/2020   HGB 14.2 10/16/2020   HCT 42.2 10/16/2020   MCV 92.3 10/16/2020   PLT 195 10/16/2020   Lab Results  Component Value Date   NA 137 10/16/2020   K 3.9 10/16/2020   CO2 26 10/16/2020   GLUCOSE 105 (H) 10/16/2020   BUN 20 10/16/2020   CREATININE 1.76 (H) 10/16/2020   BILITOT 0.6 10/16/2020   ALKPHOS 80 10/16/2020   AST 23 10/16/2020   ALT 23 10/16/2020   PROT 6.9 10/16/2020   ALBUMIN 3.9 10/16/2020   CALCIUM 9.8 10/16/2020   ANIONGAP 7 10/16/2020   GFR 37.18 (L) 08/05/2020   Lab Results  Component Value Date   CHOL 155 08/05/2020   Lab Results  Component Value Date   HDL 38.00 (L) 08/05/2020   Lab Results  Component Value Date   LDLCALC 93 08/05/2020   Lab Results  Component Value Date   TRIG 116.0 08/05/2020   Lab Results  Component Value Date   CHOLHDL 4 08/05/2020   Lab Results  Component Value Date   HGBA1C 5.7 08/05/2020       Assessment & Plan:   Problem List Items Addressed This Visit     Hyperlipidemia, mixed    Tolerating statin, encouraged heart healthy diet, avoid trans fats, minimize simple carbs and saturated fats. Increase exercise as tolerated      Essential hypertension, benign    Well controlled, no changes to meds. Encouraged heart healthy diet such as the DASH diet and exercise as tolerated.       Arthralgia    Struggling with diffuse joint pains. Worse recently. Will check labs and reevaluate. For now stay active and hydrate well.       Gout   Depression with anxiety    Still struggling with stress in his home and  with his life. No changes for now.       Tinea cruris   Relevant Medications   clotrimazole-betamethasone (LOTRISONE) lotion   fluconazole (DIFLUCAN) 035 MG  tablet   Lichen simplex chronicus   Relevant Medications   clotrimazole-betamethasone (LOTRISONE) lotion   Hyperglycemia   Rash    Possible candidal dermatitis. Started on Lotrisone and Diflucan. He will report if worsens      Myalgia    Encouraged moist heat and gentle stretching as tolerated. May try NSAIDs and prescription meds as directed and report if symptoms worsen or seek immediate care      Relevant Orders   CK   Sedimentation rate   Antinuclear Antib (ANA)   CRP High sensitivity   Neck pain    Encouraged moist heat and gentle stretching as tolerated. May try NSAIDs and prescription meds as directed and report if symptoms worsen or seek immediate care      Relevant Orders   CK   Sedimentation rate   Antinuclear Antib (ANA)   CRP High sensitivity   Other Visit Diagnoses     Candida infection    -  Primary   Relevant Medications   clotrimazole-betamethasone (LOTRISONE) lotion   fluconazole (DIFLUCAN) 150 MG tablet        Meds ordered this encounter  Medications   clotrimazole-betamethasone (LOTRISONE) lotion    Sig: Apply topically 2 (two) times daily. For 2 weeks.    Dispense:  30 mL    Refill:  0   fluconazole (DIFLUCAN) 150 MG tablet    Sig: Take 1 tablet (150 mg total) by mouth once for 1 dose.    Dispense:  1 tablet    Refill:  0    I,Zite Okoli,acting as a scribe for Penni Homans, MD.,have documented all relevant documentation on the behalf of Penni Homans, MD,as directed by  Penni Homans, MD while in the presence of Penni Homans, MD.   I, Mosie Lukes, MD., personally preformed the services described in this documentation.  All medical record entries made by the scribe were at my direction and in my presence.  I have reviewed the chart and discharge instructions (if applicable) and agree that the record reflects my personal performance and is accurate and complete. 11/04/2020

## 2020-11-04 NOTE — Assessment & Plan Note (Signed)
Struggling with diffuse joint pains. Worse recently. Will check labs and reevaluate. For now stay active and hydrate well.

## 2020-11-04 NOTE — Assessment & Plan Note (Signed)
Still struggling with stress in his home and with his life. No changes for now.

## 2020-11-04 NOTE — Assessment & Plan Note (Signed)
Possible candidal dermatitis. Started on Lotrisone and Diflucan. He will report if worsens

## 2020-11-04 NOTE — Assessment & Plan Note (Signed)
Tolerating statin, encouraged heart healthy diet, avoid trans fats, minimize simple carbs and saturated fats. Increase exercise as tolerated 

## 2020-11-04 NOTE — Assessment & Plan Note (Signed)
Encouraged moist heat and gentle stretching as tolerated. May try NSAIDs and prescription meds as directed and report if symptoms worsen or seek immediate care 

## 2020-11-05 LAB — CK: Total CK: 112 U/L (ref 7–232)

## 2020-11-06 ENCOUNTER — Other Ambulatory Visit: Payer: Self-pay

## 2020-11-06 ENCOUNTER — Other Ambulatory Visit (INDEPENDENT_AMBULATORY_CARE_PROVIDER_SITE_OTHER): Payer: Medicare Other

## 2020-11-06 DIAGNOSIS — M791 Myalgia, unspecified site: Secondary | ICD-10-CM | POA: Diagnosis not present

## 2020-11-06 DIAGNOSIS — M542 Cervicalgia: Secondary | ICD-10-CM | POA: Diagnosis not present

## 2020-11-06 NOTE — Addendum Note (Signed)
Addended by: Randolm Idol A on: 11/06/2020 11:22 AM   Modules accepted: Orders

## 2020-11-07 LAB — ANA: Anti Nuclear Antibody (ANA): NEGATIVE

## 2020-11-07 LAB — HIGH SENSITIVITY CRP: CRP, High Sensitivity: 1.68 mg/L (ref 0.000–5.000)

## 2020-11-07 LAB — SEDIMENTATION RATE: Sed Rate: 10 mm/hr (ref 0–20)

## 2020-11-08 ENCOUNTER — Encounter: Payer: Self-pay | Admitting: *Deleted

## 2020-11-11 ENCOUNTER — Other Ambulatory Visit: Payer: Self-pay

## 2020-11-11 ENCOUNTER — Ambulatory Visit (HOSPITAL_COMMUNITY)
Admission: RE | Admit: 2020-11-11 | Discharge: 2020-11-11 | Disposition: A | Discharge status: Home / Self Care | Payer: Medicare Other | Source: Ambulatory Visit | Attending: Urology | Admitting: Urology

## 2020-11-11 DIAGNOSIS — R9721 Rising PSA following treatment for malignant neoplasm of prostate: Secondary | ICD-10-CM | POA: Insufficient documentation

## 2020-11-11 MED ORDER — PIFLIFOLASTAT F 18 (PYLARIFY) INJECTION
9.0000 | Freq: Once | INTRAVENOUS | Status: AC
  Administered 2020-11-11: 9.38 via INTRAVENOUS

## 2020-11-18 ENCOUNTER — Encounter: Payer: Self-pay | Admitting: Family Medicine

## 2020-11-21 NOTE — Telephone Encounter (Signed)
See below

## 2020-12-09 ENCOUNTER — Other Ambulatory Visit: Payer: Self-pay

## 2020-12-09 ENCOUNTER — Encounter (HOSPITAL_BASED_OUTPATIENT_CLINIC_OR_DEPARTMENT_OTHER): Payer: Self-pay | Admitting: *Deleted

## 2020-12-09 ENCOUNTER — Emergency Department (HOSPITAL_BASED_OUTPATIENT_CLINIC_OR_DEPARTMENT_OTHER)
Admission: EM | Admit: 2020-12-09 | Discharge: 2020-12-09 | Disposition: A | Discharge status: Home / Self Care | Payer: Medicare Other | Attending: Emergency Medicine | Admitting: Emergency Medicine

## 2020-12-09 ENCOUNTER — Encounter: Payer: Self-pay | Admitting: *Deleted

## 2020-12-09 DIAGNOSIS — I129 Hypertensive chronic kidney disease with stage 1 through stage 4 chronic kidney disease, or unspecified chronic kidney disease: Secondary | ICD-10-CM | POA: Diagnosis not present

## 2020-12-09 DIAGNOSIS — Z8616 Personal history of COVID-19: Secondary | ICD-10-CM | POA: Diagnosis not present

## 2020-12-09 DIAGNOSIS — Z7901 Long term (current) use of anticoagulants: Secondary | ICD-10-CM | POA: Diagnosis not present

## 2020-12-09 DIAGNOSIS — Z87891 Personal history of nicotine dependence: Secondary | ICD-10-CM | POA: Diagnosis not present

## 2020-12-09 DIAGNOSIS — I4891 Unspecified atrial fibrillation: Secondary | ICD-10-CM | POA: Insufficient documentation

## 2020-12-09 DIAGNOSIS — Z8546 Personal history of malignant neoplasm of prostate: Secondary | ICD-10-CM | POA: Diagnosis not present

## 2020-12-09 DIAGNOSIS — N5089 Other specified disorders of the male genital organs: Secondary | ICD-10-CM | POA: Insufficient documentation

## 2020-12-09 DIAGNOSIS — N1831 Chronic kidney disease, stage 3a: Secondary | ICD-10-CM | POA: Diagnosis not present

## 2020-12-09 DIAGNOSIS — Z85828 Personal history of other malignant neoplasm of skin: Secondary | ICD-10-CM | POA: Diagnosis not present

## 2020-12-09 DIAGNOSIS — Z79899 Other long term (current) drug therapy: Secondary | ICD-10-CM | POA: Insufficient documentation

## 2020-12-09 DIAGNOSIS — N501 Vascular disorders of male genital organs: Secondary | ICD-10-CM

## 2020-12-09 NOTE — Telephone Encounter (Signed)
Sent out a request to get it

## 2020-12-09 NOTE — ED Provider Notes (Signed)
[facility] Provider Note   CSN: [id number] Arrival date & time: 12/09/20  2831     History Chief Complaint  Patient presents with   Sore    David [name] is a 75 y.o. male.  HPI Patient presents for concern of scrotal bleeding.  He has a history of prostate cancer that is currently under surveillance.  It has not metastasized.  He has undergone prostatic seeding.  Yesterday, he was in his normal state of health.  He had no new symptoms.  He took a shower last night and when he was drying off he noticed a drop of blood underneath him.  He noticed that this drop of blood had come from his scrotal area.  He placed a bandage on the site.  He had a difficult time visualizing it.  He denies any associated pain in the area.  He presents today due to concern of this area of bleeding.  He denies any other recent symptoms or concerns.  Patient is on Eliquis.    Past Medical History:  Diagnosis Date   Adenoma of right adrenal gland 07/11/2002   2.4cm , noted on CT ABD   Allergy    Anxiety    Arthritis of both knees 03/26/2016   Astigmatism    Back pain    BCC (basal cell carcinoma of skin)    under right eye and right ear   Blood transfusion without reported diagnosis    Cataract 09/29/2016   Depression    Diverticulitis 2009   Diverticulosis    Family history of breast cancer    Family history of colon cancer    Fatty liver    GERD (gastroesophageal reflux disease)    Gout    Hearing loss of both ears 03/26/2016   History of atrial fibrillation    x 2 none since 2020   History of chronic prostatitis    started at age 44   History of kidney stones    Hyperglycemia    Hyperlipidemia    Hypertension    Incomplete right bundle branch block (RBBB)    Internal hemorrhoids    Kidney lesion 06/07/2015   Right midportion, 1.1x1.1 cm hyperechoic, noted on [place] ABD   LAFB (left anterior fascicular block)    Lipoma of axilla 09/2016   Liver lesion,  right lobe 06/07/2015   2.5x2.4x2.4 cm hypoechoic lesion posterior aspect, noted on [place] ABD   Low back pain 03/26/2016   LVH (left ventricular hypertrophy) 08/06/2017   Moderate, Noted on ECHO   Medicare annual wellness visit, subsequent 03/12/2014   OA (osteoarthritis)    Back, Hands, Neck   Obesity 11/25/2007   Qualifier: Diagnosis of  By: Lenna Gilford MD, Deborra Medina    Other malaise and fatigue 03/13/2013   Premature ventricular complex    Prostate cancer ([place]) dx 2021   Renal insufficiency    Kidney removed right   Scoliosis    Upper thoracic and lumbar   Sinus bradycardia 08/06/2017   Noted on EKG   Vitamin D deficiency    resolved    Patient Active Problem List   Diagnosis Date Noted   Myalgia 11/04/2020   Neck pain 11/04/2020   Stage 3a chronic kidney disease ([place]) 07/02/2020   Urinary frequency 02/12/2020   Genetic testing 09/26/2019   Family history of breast cancer    Family history of colon cancer    Educated about COVID-19 virus infection 08/26/2019   Malignant neoplasm of prostate (  [place]) 04/28/2019   Pruritus 02/03/2019   LVH (left ventricular hypertrophy) 10/30/2018   Tinea versicolor 09/28/2018   Thoracic back pain 09/26/2018   Hypercalcemia 08/08/2018   Nonfunctioning kidney 08/04/2018   Arm pain 07/18/2018   Right hip pain 06/22/2018   Ureteral obstruction 06/07/2018   Recurrent nephrolithiasis 04/23/2018   Hydronephrosis 03/01/2018   Floppy eyelid syndrome of both eyes 01/24/2018   Foot pain, bilateral 12/28/2017   Kidney stone 09/18/2017   Insomnia 09/17/2017   Abnormal TSH 04/15/2017   Senile nuclear sclerosis 01/18/2017   Cataract 09/29/2016   Muscle cramp 09/29/2016   Nocturia 09/29/2016   Hyperglycemia 03/29/2016   Low back pain 03/26/2016   Cataracts, bilateral 03/26/2016   Arthritis of both knees 03/26/2016   Hearing loss 03/26/2016   Complex renal cyst 06/16/2015   History of atrial fibrillation 06/16/2015   Snoring 06/07/2015   Somnolence  06/07/2015   Preventative health care 03/07/2015   Lipoma of axilla 06/04/2014   Tinea cruris 79/89/2119   Lichen simplex chronicus 03/20/2014   Medicare annual wellness visit, subsequent 03/12/2014   Depression with anxiety 09/25/2013   Constipation 09/25/2013   Gouty arthritis of toe of left foot 08/01/2013   Skin lesion 05/27/2013   Rash 05/26/2013   Abnormal liver function 03/18/2013   IBS (irritable bowel syndrome) 03/18/2013   Other malaise and fatigue 03/13/2013   Tinea corporis 03/13/2013   Gout 12/14/2012   Diarrhea 12/14/2012   Rectal irritation 09/18/2012   Diverticulosis    BCC (basal cell carcinoma of skin)    Scoliosis 01/19/2011   Hyperlipidemia, mixed 03/11/2010   Essential hypertension, benign 03/11/2010   ALLERGIC RHINITIS 03/11/2010   Arthralgia 03/11/2010   Obesity 11/25/2007   BENIGN NEOPLASM OF ADRENAL GLAND 09/22/2007   Fatty liver disease, nonalcoholic 41/74/0814   Osteoarthritis 09/22/2007   SPONDYLOSIS, LUMBAR 09/22/2007   GERD 04/12/2007    Past Surgical History:  Procedure Laterality Date   CARDIOVERSION  07/31/2017   CATARACT EXTRACTION, BILATERAL     COLON SURGERY  2009   segmental sigmoid resection   COLONOSCOPY     CYSTOSCOPY N/A 07/28/2019   Procedure: CYSTOSCOPY FLEXIBLE;  Surgeon: Cleon Gustin, MD;  Location: [facility];  Service: Urology;  Laterality: N/A;   CYSTOSCOPY W/ RETROGRADES Right 06/07/2018   Procedure: CYSTOSCOPY WITH RETROGRADE PYELOGRAM, uphrostogram;  Surgeon: Cleon Gustin, MD;  Location: [facility];  Service: Urology;  Laterality: Right;   CYSTOSCOPY WITH URETEROSCOPY AND STENT PLACEMENT Right 02/28/2018   Procedure: CYSTOSCOPY WITH URETEROSCOPY [place];  Surgeon: Kathie Rhodes, MD;  Location: [facility];  Service: Urology;  Laterality: Right;   CYSTOSCOPY/URETEROSCOPY/HOLMIUM LASER/STENT PLACEMENT Right 11/29/2017   Procedure: CYSTOSCOPY/RETROGRADE/URETEROSCOPY/HOLMIUM  LASER/STENT PLACEMENT;  Surgeon: Kathie Rhodes, MD;  Location: [facility];  Service: Urology;  Laterality: Right;   EYE SURGERY Bilateral 01/12/2017   cataract removal   history of blood tranfusion  age 27   HOLMIUM LASER APPLICATION Right 4/81/8563   Procedure: HOLMIUM LASER APPLICATION;  Surgeon: Kathie Rhodes, MD;  Location: [facility];  Service: Urology;  Laterality: Right;   IR BALLOON DILATION URETERAL STRICTURE RIGHT  03/14/2018   IR NEPHRO TUBE REMOV/FL  03/17/2018   IR NEPHROSTOGRAM RIGHT THRU EXISTING ACCESS  03/17/2018   IR NEPHROSTOMY EXCHANGE RIGHT  03/14/2018   IR NEPHROSTOMY EXCHANGE RIGHT  06/21/2018   IR NEPHROSTOMY PLACEMENT RIGHT  03/02/2018   IR NEPHROSTOMY PLACEMENT RIGHT  04/20/2018   IR URETERAL STENT PLACEMENT EXISTING ACCESS RIGHT  03/14/2018   NOSE SURGERY     Submucous resection age 38   NUCLEAR STRESS TEST  06/03/2009   RADIOACTIVE SEED IMPLANT N/A 07/28/2019   Procedure: RADIOACTIVE SEED IMPLANT/BRACHYTHERAPY IMPLANT;  Surgeon: Cleon Gustin, MD;  Location: [facility];  Service: Urology;  Laterality: N/A;  90 MINS   ROBOT ASSISTED LAPAROSCOPIC NEPHRECTOMY Right 08/04/2018   Procedure: XI ROBOTIC ASSISTED LAPAROSCOPIC NEPHRECTOMY;  Surgeon: Cleon Gustin, MD;  Location: [facility];  Service: Urology;  Laterality: Right;  3 HRS   ROBOT ASSISTED PYELOPLASTY Right 06/07/2018   Procedure: attempted XI ROBOTIC ASSISTED PYELOPLASTY, lysis of adhesions;  Surgeon: Cleon Gustin, MD;  Location: [facility];  Service: Urology;  Laterality: Right;  3 HRS   SKIN BIOPSY     SPACE OAR INSTILLATION N/A 07/28/2019   Procedure: SPACE OAR INSTILLATION;  Surgeon: Cleon Gustin, MD;  Location[facility];  Service: Urology;  Laterality: N/A;   URETEROSCOPY WITH HOLMIUM LASER LITHOTRIPSY Right 09/24/2017   Procedure: CYSTOSCOPY, URETEROSCOPY WITH HOLMIUM LASER LITHOTRIPSY, STENT PLACEMENT;  Surgeon: Kathie Rhodes, MD;  Location: [facility];  Service: Urology;  Laterality: Right;       Family History  Problem Relation Age of Onset   Heart disease Mother    Hypertension Mother    Stroke Mother    Colon cancer Mother 2   Breast cancer Mother 41   Heart disease Father        pacemaker   Aortic aneurysm Father    Hypertension Father    Lung cancer Father 3   Heart disease Sister    Atrial fibrillation Sister    Obesity Sister    Sleep apnea Sister    Heart attack Brother    Other Brother        muscle disease   Arthritis Brother    Stroke Brother    Atrial fibrillation Brother    Heart attack Brother    Atrial fibrillation Brother    Diabetes Brother    Atrial fibrillation Brother    Heart attack Brother    Hypertension Brother    Hyperlipidemia Brother    Heart attack Brother    Other Brother        heart valve operation   Atrial fibrillation Brother    Heart attack Maternal Grandmother    Diabetes Maternal Grandmother    Cancer Maternal Grandfather 16       hodgin's lymphoma   Heart attack Paternal Grandmother    Anxiety disorder Paternal Grandmother    Pneumonia Paternal Grandfather    Liver cancer Nephew 2       great nephew; d. 68   Lymphoma Niece 79   Esophageal cancer Neg Hx    Rectal cancer Neg Hx    Stomach cancer Neg Hx     Social History   Tobacco Use   Smoking status: Former    Packs/day: 1.00    Years: 6.00    Pack years: 6.00    Types: Cigarettes    Start date: 01/11/1969    Quit date: 01/12/1969    Years since quitting: 51.9   Smokeless tobacco: Never   Tobacco comments:    1965-1971  Vaping Use   Vaping Use: Never used  Substance Use Topics   Alcohol use: Yes    Comment: occ   Drug use: Never    Home Medications Prior to Admission medications   Medication Sig Start Date End Date Taking? Authorizing  Provider  apixaban (ELIQUIS) 5 MG TABS tablet TAKE ONE TABLET BY MOUTH TWICE A DAY 09/02/20   Minus Breeding, MD  ascorbic acid (VITAMIN C) 1000 MG  tablet Take by mouth.    [provider]  atenolol (TENORMIN) 50 MG tablet Take 1 tablet (50 mg total) by mouth 2 (two) times daily. 10/25/20   Mosie Lukes, MD  atorvastatin (LIPITOR) 10 MG tablet TAKE ONE TABLET BY MOUTH EVERY MORNING 09/26/20   Mosie Lukes, MD  clotrimazole-betamethasone (LOTRISONE) lotion Apply topically 2 (two) times daily. For 2 weeks. 11/04/20   Mosie Lukes, MD  colchicine 0.6 MG tablet For gout flares, take 1 tab po once then repeat in 1 hour as needed if pain persists up to a total of 3 doses 05/20/20   Mosie Lukes, MD  FLUoxetine (PROZAC) 10 MG tablet Take 1 tablet (10 mg total) by mouth 3 (three) times daily. 09/02/20   Mosie Lukes, MD  LORazepam (ATIVAN) 1 MG tablet TAKE ONE TABLET BY MOUTH TWICE A DAY AS NEEDED FOR ANXIETY OR SEDATION 06/20/20   Mosie Lukes, MD  [facility] powder Apply topically. 10/18/20   [provider]  omeprazole (PRILOSEC) 20 MG capsule Take 20 mg by mouth daily as needed (acid reflux).     [provider]    Allergies    Cefaclor, Cephalosporins, and Penicillins  Review of Systems   Review of Systems  Constitutional:  Negative for chills and fever.  HENT:  Negative for ear pain and sore throat.   Eyes:  Negative for pain and visual disturbance.  Respiratory:  Negative for cough and shortness of breath.   Cardiovascular:  Negative for chest pain and palpitations.  Gastrointestinal:  Negative for abdominal pain, nausea and vomiting.  Genitourinary:  Negative for difficulty urinating, dysuria, flank pain, hematuria, penile discharge, penile pain, penile swelling, scrotal swelling and testicular pain.  Musculoskeletal:  Negative for arthralgias and back pain.  Skin:  Positive for wound. Negative for color change and rash.  Neurological:  Negative for dizziness, seizures, syncope, weakness, light-headedness, numbness and headaches.  Hematological:  Bruises/bleeds easily (On Eliquis).   Psychiatric/Behavioral:  Negative for confusion and decreased concentration.   All other systems reviewed and are negative.  Physical Exam Updated Vital Signs BP (!) 172/81 (BP Location: Right Arm)   Pulse 65   Temp 98.3 F (36.8 C) (Oral)   Resp 18   Ht 5\' 11"  (1.803 m)   Wt 117.5 kg   SpO2 98%   BMI 36.12 kg/m   Physical Exam Vitals and nursing note reviewed. Exam conducted with a chaperone present.  Constitutional:      General: He is not in acute distress.    Appearance: Normal appearance. He is well-developed. He is not ill-appearing, toxic-appearing or diaphoretic.  HENT:     Head: Normocephalic and atraumatic.     Right Ear: External ear normal.     Left Ear: External ear normal.     Nose: Nose normal.  Eyes:     General: No scleral icterus.    Extraocular Movements: Extraocular movements intact.     Conjunctiva/sclera: Conjunctivae normal.  Cardiovascular:     Rate and Rhythm: Normal rate and regular rhythm.     Heart sounds: No murmur heard. Pulmonary:     Effort: Pulmonary effort is normal. No respiratory distress.  Abdominal:     Palpations: Abdomen is soft.     Tenderness: There is no abdominal tenderness.  Genitourinary:    Testes:        Right: Mass, tenderness or swelling not present.        Left: Mass, tenderness or swelling not present.     Comments: Scrotal skin is normal in appearance.  No evidence of wound or sore.  No bleeding at this time.  No areas of swelling, induration, or tenderness. Musculoskeletal:        General: No swelling.     Cervical back: Normal range of motion and neck supple.     Right lower leg: No edema.     Left lower leg: No edema.  Skin:    General: Skin is warm and dry.     Capillary Refill: Capillary refill takes less than 2 seconds.  Neurological:     Mental Status: He is alert.  Psychiatric:        Mood and Affect: Mood normal.    ED Results / Procedures / Treatments   Labs (all labs ordered are listed, but  only abnormal results are displayed) Labs Reviewed - No data to display  EKG None  Radiology No results found.  Procedures Procedures   Medications Ordered in ED Medications - No data to display  ED Course  I have reviewed the triage vital signs and the nursing notes.  Pertinent labs & imaging results that were available during my care of the patient were reviewed by me and considered in my medical decision making (see chart for details).    MDM Rules/Calculators/A&P                          Patient presents due to concern of scrotal bleed. This was noticed last night.  Bleeding consisted of only a couple drops.  He did place a bandage over the area.  He is on Eliquis.  On arrival in the ED, vital signs are notable for hypertension.  Patient is well-appearing on exam.  Examination of scrotal area performed with chaperone present.  Bandage was removed.  There is a scant amount of blood on the bandage.  Underlying skin showed no evidence of bleeding, induration, or tenderness.  I was unable to even visualize the break in the skin.  Patient was given reassurance.  He will follow-up with his urologist as scheduled.  Patient was discharged in good condition.  Final Clinical Impression(s) / ED Diagnoses Final diagnoses:  Scrotal bleeding    Rx / DC Orders ED Discharge Orders     None        Godfrey Pick, MD 12/09/20 [phone]

## 2020-12-09 NOTE — ED Triage Notes (Signed)
Sore on scrotum noticed some blood yesterday   denies pain

## 2020-12-19 ENCOUNTER — Encounter: Payer: Self-pay | Admitting: Family Medicine

## 2020-12-19 ENCOUNTER — Ambulatory Visit (INDEPENDENT_AMBULATORY_CARE_PROVIDER_SITE_OTHER): Payer: Medicare Other | Admitting: Family Medicine

## 2020-12-19 VITALS — BP 126/62 | HR 68 | Temp 98.3°F | Resp 16 | Wt 255.4 lb

## 2020-12-19 DIAGNOSIS — I10 Essential (primary) hypertension: Secondary | ICD-10-CM

## 2020-12-19 DIAGNOSIS — E782 Mixed hyperlipidemia: Secondary | ICD-10-CM

## 2020-12-19 DIAGNOSIS — F418 Other specified anxiety disorders: Secondary | ICD-10-CM

## 2020-12-19 DIAGNOSIS — R351 Nocturia: Secondary | ICD-10-CM

## 2020-12-19 DIAGNOSIS — R739 Hyperglycemia, unspecified: Secondary | ICD-10-CM

## 2020-12-19 DIAGNOSIS — L299 Pruritus, unspecified: Secondary | ICD-10-CM

## 2020-12-19 DIAGNOSIS — M1A9XX0 Chronic gout, unspecified, without tophus (tophi): Secondary | ICD-10-CM | POA: Diagnosis not present

## 2020-12-19 DIAGNOSIS — R252 Cramp and spasm: Secondary | ICD-10-CM

## 2020-12-19 DIAGNOSIS — M109 Gout, unspecified: Secondary | ICD-10-CM

## 2020-12-19 NOTE — Progress Notes (Signed)
Patient ID: David [name], male    DOB: 05-27-1945  Age: 75 y.o. MRN: [id number]    Subjective:   No chief complaint on file.  Subjective   HPI David [name] presents for office visit today for follow up on gout and back pain. He is doing well and has no febrile illnesses or recent ER visits. He has c/o rashes, HA's, gout, and back pain/right hip pain.  Rash: He reports that he still itches in ears, eyes, and penis and itching is still bout the same. He states that lotrisone cream only helped slightly and was not as effective. Creme helps but not as affective.   HA's: He experiences HA's when he reads for extended periods of time and he plans on going to the eye doctor soon. He endorses being well hydrated and drinking decaf tea.  Gout: He reports that it is mainly local to his bilateral knees and feels tender to touch. Denies any inflammation or a hot sensation.  Musculoskeletal pain: He is still experiencing back pain, right hip pain, and intermittent pain in left pectoral muscle. He states that the pain worsens when doing activities around the house. He has no associated symptoms of CP or SOB. Denies CP/palp/SOB/congestion/fevers/GI or GU c/o. Taking meds as prescribed.   Review of Systems  Constitutional:  Negative for chills, fatigue and fever.  HENT:  Negative for congestion, rhinorrhea, sinus pressure, sinus pain, sore throat and trouble swallowing.   Eyes:  Negative for pain.  Respiratory:  Negative for cough and shortness of breath.   Cardiovascular:  Negative for chest pain, palpitations and leg swelling.  Gastrointestinal:  Negative for abdominal pain, blood in stool, diarrhea, nausea and vomiting.  Genitourinary:  Negative for flank pain, frequency and penile pain.  Musculoskeletal:  Positive for arthralgias, back pain, joint swelling and myalgias.  Neurological:  Negative for headaches.   History Past Medical History:  Diagnosis Date   Adenoma of right adrenal  gland 07/11/2002   2.4cm , noted on CT ABD   Allergy    Anxiety    Arthritis of both knees 03/26/2016   Astigmatism    Back pain    BCC (basal cell carcinoma of skin)    under right eye and right ear   Blood transfusion without reported diagnosis    Cataract 09/29/2016   Depression    Diverticulitis 2009   Diverticulosis    Family history of breast cancer    Family history of colon cancer    Fatty liver    GERD (gastroesophageal reflux disease)    Gout    Hearing loss of both ears 03/26/2016   History of atrial fibrillation    x 2 none since 2020   History of chronic prostatitis    started at age 57   History of kidney stones    Hyperglycemia    Hyperlipidemia    Hypertension    Incomplete right bundle branch block (RBBB)    Internal hemorrhoids    Kidney lesion 06/07/2015   Right midportion, 1.1x1.1 cm hyperechoic, noted on [place] ABD   LAFB (left anterior fascicular block)    Lipoma of axilla 09/2016   Liver lesion, right lobe 06/07/2015   2.5x2.4x2.4 cm hypoechoic lesion posterior aspect, noted on [place] ABD   Low back pain 03/26/2016   LVH (left ventricular hypertrophy) 08/06/2017   Moderate, Noted on ECHO   Medicare annual wellness visit, subsequent 03/12/2014   OA (osteoarthritis)    Back, Hands, Neck  Obesity 11/25/2007   Qualifier: Diagnosis of  By: Lenna Gilford MD, Deborra Medina    Other malaise and fatigue 03/13/2013   Premature ventricular complex    Prostate cancer [facility]) dx 2021   Renal insufficiency    Kidney removed right   Scoliosis    Upper thoracic and lumbar   Sinus bradycardia 08/06/2017   Noted on EKG   Vitamin D deficiency    resolved    He has a past surgical history that includes Nose surgery; Colonoscopy; Skin biopsy; Cardioversion (07/31/2017); NUCLEAR STRESS TEST (06/03/2009); Cataract extraction, bilateral; Ureteroscopy with holmium laser lithotripsy (Right, 09/24/2017); Holmium laser application (Right, 9/50/9326); Cystoscopy/ureteroscopy/holmium  laser/stent placement (Right, 11/29/2017); Cystoscopy with ureteroscopy and stent placement (Right, 02/28/2018); IR NEPHROSTOMY PLACEMENT RIGHT (03/02/2018); IR URETERAL STENT PLACEMENT EXISTING ACCESS RIGHT (03/14/2018); IR NEPHROSTOMY EXCHANGE RIGHT (03/14/2018); IR BALLOON DILATION URETERAL STRICTURE RIGHT (03/14/2018); IR Nephro Tube Remov/FL (03/17/2018); IR NEPHROSTOGRAM RIGHT THRU EXISTING ACCESS (03/17/2018); IR NEPHROSTOMY PLACEMENT RIGHT (04/20/2018); Robot assisted pyeloplasty (Right, 06/07/2018); Cystoscopy w/ retrogrades (Right, 06/07/2018); IR NEPHROSTOMY EXCHANGE RIGHT (06/21/2018); Robot assisted laparoscopic nephrectomy (Right, 08/04/2018); Colon surgery (2009); Eye surgery (Bilateral, 01/12/2017); history of blood tranfusion (age 16); Radioactive seed implant (N/A, 07/28/2019); SPACE OAR INSTILLATION (N/A, 07/28/2019); and Cystoscopy (N/A, 07/28/2019).   His family history includes Anxiety disorder in his paternal grandmother; Aortic aneurysm in his father; Arthritis in his brother; Atrial fibrillation in his brother, brother, brother, brother, and sister; Breast cancer (age of onset: 33) in his mother; Cancer (age of onset: 49) in his maternal grandfather; Colon cancer (age of onset: 39) in his mother; Diabetes in his brother and maternal grandmother; Heart attack in his brother, brother, brother, brother, maternal grandmother, and paternal grandmother; Heart disease in his father, mother, and sister; Hyperlipidemia in his brother; Hypertension in his brother, father, and mother; Liver cancer (age of onset: 2) in his nephew; Lung cancer (age of onset: 58) in his father; Lymphoma (age of onset: 70) in his niece; Obesity in his sister; Other in his brother and brother; Pneumonia in his paternal grandfather; Sleep apnea in his sister; Stroke in his brother and mother.He reports that he quit smoking about 51 years ago. His smoking use included cigarettes. He started smoking about 51 years ago. He has a 6.00 pack-year  smoking history. He has never used smokeless tobacco. He reports current alcohol use. He reports that he does not use drugs.  Current Outpatient Medications on File Prior to Visit  Medication Sig Dispense Refill   apixaban (ELIQUIS) 5 MG TABS tablet TAKE ONE TABLET BY MOUTH TWICE A DAY 180 tablet 1   ascorbic acid (VITAMIN C) 1000 MG tablet Take by mouth.     atenolol (TENORMIN) 50 MG tablet Take 1 tablet (50 mg total) by mouth 2 (two) times daily. 180 tablet 1   atorvastatin (LIPITOR) 10 MG tablet TAKE ONE TABLET BY MOUTH EVERY MORNING 90 tablet 1   clotrimazole-betamethasone (LOTRISONE) lotion Apply topically 2 (two) times daily. For 2 weeks. 30 mL 0   colchicine 0.6 MG tablet For gout flares, take 1 tab po once then repeat in 1 hour as needed if pain persists up to a total of 3 doses 15 tablet 0   FLUoxetine (PROZAC) 10 MG tablet Take 1 tablet (10 mg total) by mouth 3 (three) times daily. 270 tablet 1   LORazepam (ATIVAN) 1 MG tablet TAKE ONE TABLET BY MOUTH TWICE A DAY AS NEEDED FOR ANXIETY OR SEDATION 180 tablet 2   NYAMYC powder Apply topically.  omeprazole (PRILOSEC) 20 MG capsule Take 20 mg by mouth daily as needed (acid reflux).      No current facility-administered medications on file prior to visit.     Objective:  Objective  Physical Exam Constitutional:      General: He is not in acute distress.    Appearance: Normal appearance. He is not ill-appearing or toxic-appearing.  HENT:     Head: Normocephalic and atraumatic.     Right Ear: Tympanic membrane, ear canal and external ear normal.     Left Ear: Tympanic membrane, ear canal and external ear normal.     Nose: No congestion or rhinorrhea.  Eyes:     Extraocular Movements: Extraocular movements intact.     Pupils: Pupils are equal, round, and reactive to light.  Cardiovascular:     Rate and Rhythm: Normal rate and regular rhythm.     Pulses: Normal pulses.     Heart sounds: Normal heart sounds. No murmur  heard. Pulmonary:     Effort: Pulmonary effort is normal. No respiratory distress.     Breath sounds: Normal breath sounds. No wheezing, rhonchi or rales.  Abdominal:     General: Bowel sounds are normal.     Palpations: Abdomen is soft. There is no mass.     Tenderness: There is no abdominal tenderness. There is no guarding.     Hernia: No hernia is present.  Musculoskeletal:        General: Normal range of motion.     Cervical back: Normal range of motion and neck supple.  Skin:    General: Skin is warm and dry.  Neurological:     Mental Status: He is alert and oriented to person, place, and time.  Psychiatric:        Behavior: Behavior normal.   BP 126/62   Pulse 68   Temp 98.3 F (36.8 C)   Resp 16   Wt 255 lb 6.4 oz (115.8 kg)   SpO2 99%   BMI 35.62 kg/m  Wt Readings from Last 3 Encounters:  12/19/20 255 lb 6.4 oz (115.8 kg)  12/09/20 259 lb (117.5 kg)  11/04/20 251 lb (113.9 kg)     Lab Results  Component Value Date   WBC 7.0 10/16/2020   HGB 14.2 10/16/2020   HCT 42.2 10/16/2020   PLT 195 10/16/2020   GLUCOSE 105 (H) 10/16/2020   CHOL 155 08/05/2020   TRIG 116.0 08/05/2020   HDL 38.00 (L) 08/05/2020   LDLDIRECT 168.5 03/29/2006   LDLCALC 93 08/05/2020   ALT 23 10/16/2020   AST 23 10/16/2020   NA 137 10/16/2020   K 3.9 10/16/2020   CL 104 10/16/2020   CREATININE 1.76 (H) 10/16/2020   BUN 20 10/16/2020   CO2 26 10/16/2020   TSH 3.07 08/05/2020   PSA 3.84 10/11/2020   INR 1.0 07/25/2019   HGBA1C 5.7 08/05/2020    No results found.   Assessment & Plan:  Plan    No orders of the defined types were placed in this encounter.   Problem List Items Addressed This Visit     Hyperlipidemia, mixed - Primary    Tolerating statin, encouraged heart healthy diet, avoid trans fats, minimize simple carbs and saturated fats. Increase exercise as tolerated      Relevant Orders   Comprehensive metabolic panel   Lipid panel   Essential hypertension,  benign    Well controlled, no changes to meds. Encouraged heart healthy diet such as the  DASH diet and exercise as tolerated.       Relevant Orders   CBC   TSH   Gout   Relevant Orders   Uric acid   Gouty arthritis of toe of left foot    Hydrate and monitor      Depression with anxiety    He continues to struggle with stress at home as he helps to care for his wife as her health continues to decline. Overall he is managing well and does not need a change in meds today      Hyperglycemia    hgba1c acceptable, minimize simple carbs. Increase exercise as tolerated.      Relevant Orders   Hemoglobin A1c   Muscle cramp   Relevant Orders   Magnesium   Nocturia   Relevant Orders   Urinalysis   Urine Culture   Hypercalcemia   Relevant Orders   VITAMIN D 25 Hydroxy (Vit-D Deficiency, Fractures)   Pruritus    Continues to recur but improves intermittently with treatment and then returns. Try treating the tinea with a bath weekly with 1 tbls of bleach in it and then topical treatments       Follow-up: Return in about 3 months (around 03/19/2021) for f/u visit.  I, Suezanne Jacquet, acting as a scribe for Penni Homans, MD, have documented all relevent documentation on behalf of Penni Homans, MD, as directed by Penni Homans, MD while in the presence of Penni Homans, MD. DO:12/20/20.  I, Mosie Lukes, MD personally performed the services described in this documentation. All medical record entries made by the scribe were at my direction and in my presence. I have reviewed the chart and agree that the record reflects my personal performance and is accurate and complete

## 2020-12-19 NOTE — Patient Instructions (Addendum)
Check [place] for hearing aides  Hearing Loss Hearing loss is a partial or total loss of the ability to hear. This can be temporary or permanent, and it can happen in one or both ears. Medical care is necessary to treat hearing loss properly and to prevent the condition from getting worse. Your hearing may partially or completely come back, depending on what caused your hearing loss and how severe it is. In some cases, hearing loss is permanent. What are the causes? Common causes of hearing loss include: Too much wax in the ear canal. Infection of the ear canal or middle ear. Fluid in the middle ear. Injury to the ear or surrounding area. An object stuck in the ear. A history of prolonged exposure to loud sounds, such as music. Less common causes of hearing loss include: Tumors in the ear. Viral or bacterial infections, such as meningitis. A hole in the eardrum (perforated eardrum). Problems with the hearing nerve that sends signals between the brain and the ear. Certain medicines. What are the signs or symptoms? Symptoms of this condition may include: Difficulty telling the difference between sounds. Difficulty following a conversation when there is background noise. Lack of response to sounds in your environment. This may be most noticeable when you do not respond to startling sounds. Needing to turn up the volume on the television, radio, or other devices. Ringing in the ears. Dizziness. How is this diagnosed? This condition is diagnosed based on: A physical exam. A hearing test (audiometry). The audiometry test will be performed by a hearing specialist (audiologist). You may also be referred to an ear, nose, and throat (ENT) specialist (otolaryngologist). How is this treated? Treatment for hearing loss may include: Ear wax removal. Medicines to treat or prevent infection (antibiotics). Medicines to reduce inflammation (corticosteroids). Hearing aids for hearing loss related to  nerve damage. Follow these instructions at home: If you were prescribed an antibiotic medicine, take it as told by your health care provider. Do not stop taking the antibiotic even if you start to feel better. Take over-the-counter and prescription medicines only as told by your health care provider. Avoid loud noises. Return to your normal activities as told by your health care provider. Ask your health care provider what activities are safe for you. Keep all follow-up visits as told by your health care provider. This is important. Contact a health care provider if: You feel dizzy. You develop new symptoms. You vomit or feel nauseous. You have a fever. Get help right away if: You develop sudden changes in your vision. You have severe ear pain. You have new or increased weakness. You have a severe headache. Summary Hearing loss is a decreased ability to hear sounds around you. It can be temporary or permanent. Treatment will depend on the cause of your hearing loss. It may include ear wax removal, medicines, or a hearing aid. Your hearing may partially or completely come back, depending on what caused your hearing loss and how severe it is. Keep all follow-up visits as told by your health care provider. This is important. This information is not intended to replace advice given to you by your health care provider. Make sure you discuss any questions you have with your health care provider. Document Revised: 09/28/2017 Document Reviewed: 09/28/2017 Elsevier Patient Education  [place].

## 2020-12-20 ENCOUNTER — Other Ambulatory Visit (INDEPENDENT_AMBULATORY_CARE_PROVIDER_SITE_OTHER): Payer: Medicare Other

## 2020-12-20 DIAGNOSIS — R351 Nocturia: Secondary | ICD-10-CM

## 2020-12-20 DIAGNOSIS — I10 Essential (primary) hypertension: Secondary | ICD-10-CM

## 2020-12-20 DIAGNOSIS — R252 Cramp and spasm: Secondary | ICD-10-CM | POA: Diagnosis not present

## 2020-12-20 DIAGNOSIS — M1A9XX0 Chronic gout, unspecified, without tophus (tophi): Secondary | ICD-10-CM

## 2020-12-20 DIAGNOSIS — E782 Mixed hyperlipidemia: Secondary | ICD-10-CM

## 2020-12-20 DIAGNOSIS — R739 Hyperglycemia, unspecified: Secondary | ICD-10-CM

## 2020-12-20 NOTE — Assessment & Plan Note (Signed)
Hydrate and monitor 

## 2020-12-20 NOTE — Assessment & Plan Note (Signed)
He continues to struggle with stress at home as he helps to care for his wife as her health continues to decline. Overall he is managing well and does not need a change in meds today

## 2020-12-20 NOTE — Assessment & Plan Note (Signed)
Well controlled, no changes to meds. Encouraged heart healthy diet such as the DASH diet and exercise as tolerated.  °

## 2020-12-20 NOTE — Assessment & Plan Note (Signed)
Continues to recur but improves intermittently with treatment and then returns. Try treating the tinea with a bath weekly with 1 tbls of bleach in it and then topical treatments

## 2020-12-20 NOTE — Assessment & Plan Note (Signed)
Tolerating statin, encouraged heart healthy diet, avoid trans fats, minimize simple carbs and saturated fats. Increase exercise as tolerated 

## 2020-12-20 NOTE — Assessment & Plan Note (Signed)
hgba1c acceptable, minimize simple carbs. Increase exercise as tolerated.  

## 2020-12-21 LAB — URINALYSIS
Bilirubin Urine: NEGATIVE
Glucose, UA: NEGATIVE
Hgb urine dipstick: NEGATIVE
Ketones, ur: NEGATIVE
Leukocytes,Ua: NEGATIVE
Nitrite: NEGATIVE
Protein, ur: NEGATIVE
Specific Gravity, Urine: 1.02 (ref 1.001–1.035)
pH: 6 (ref 5.0–8.0)

## 2020-12-21 LAB — CBC
HCT: 41.8 % (ref 38.5–50.0)
Hemoglobin: 14.1 g/dL (ref 13.2–17.1)
MCH: 31.5 pg (ref 27.0–33.0)
MCHC: 33.7 g/dL (ref 32.0–36.0)
MCV: 93.5 fL (ref 80.0–100.0)
MPV: 10.8 fL (ref 7.5–12.5)
Platelets: 196 10*3/uL (ref 140–400)
RBC: 4.47 10*6/uL (ref 4.20–5.80)
RDW: 13.4 % (ref 11.0–15.0)
WBC: 6.5 10*3/uL (ref 3.8–10.8)

## 2020-12-21 LAB — COMPREHENSIVE METABOLIC PANEL
AG Ratio: 1.6 (calc) (ref 1.0–2.5)
ALT: 21 U/L (ref 9–46)
AST: 17 U/L (ref 10–35)
Albumin: 4.2 g/dL (ref 3.6–5.1)
Alkaline phosphatase (APISO): 90 U/L (ref 35–144)
BUN/Creatinine Ratio: 11 (calc) (ref 6–22)
BUN: 20 mg/dL (ref 7–25)
CO2: 26 mmol/L (ref 20–32)
Calcium: 10.4 mg/dL — ABNORMAL HIGH (ref 8.6–10.3)
Chloride: 104 mmol/L (ref 98–110)
Creat: 1.87 mg/dL — ABNORMAL HIGH (ref 0.70–1.28)
Globulin: 2.6 g/dL (calc) (ref 1.9–3.7)
Glucose, Bld: 104 mg/dL — ABNORMAL HIGH (ref 65–99)
Potassium: 4.2 mmol/L (ref 3.5–5.3)
Sodium: 139 mmol/L (ref 135–146)
Total Bilirubin: 0.8 mg/dL (ref 0.2–1.2)
Total Protein: 6.8 g/dL (ref 6.1–8.1)

## 2020-12-21 LAB — LIPID PANEL
Cholesterol: 175 mg/dL (ref <200)
HDL: 38 mg/dL — ABNORMAL LOW (ref >=40)
LDL Cholesterol (Calc): 107 mg/dL (calc) — ABNORMAL HIGH
Non-HDL Cholesterol (Calc): 137 mg/dL (calc) — ABNORMAL HIGH (ref <130)
Total CHOL/HDL Ratio: 4.6 (calc) (ref <5.0)
Triglycerides: 182 mg/dL — ABNORMAL HIGH (ref <150)

## 2020-12-21 LAB — URINE CULTURE
MICRO NUMBER:: 12737892
Result:: NO GROWTH
SPECIMEN QUALITY:: ADEQUATE

## 2020-12-21 LAB — HEMOGLOBIN A1C
Hgb A1c MFr Bld: 5.6 % of total Hgb (ref <5.7)
Mean Plasma Glucose: 114 mg/dL
eAG (mmol/L): 6.3 mmol/L

## 2020-12-21 LAB — MAGNESIUM: Magnesium: 1.9 mg/dL (ref 1.5–2.5)

## 2020-12-21 LAB — VITAMIN D 25 HYDROXY (VIT D DEFICIENCY, FRACTURES): Vit D, 25-Hydroxy: 33 ng/mL (ref 30–100)

## 2020-12-21 LAB — TSH: TSH: 3.11 mIU/L (ref 0.40–4.50)

## 2020-12-21 LAB — URIC ACID: Uric Acid, Serum: 8.4 mg/dL — ABNORMAL HIGH (ref 4.0–8.0)

## 2021-01-21 ENCOUNTER — Other Ambulatory Visit: Payer: Self-pay | Admitting: Family Medicine

## 2021-01-21 NOTE — Telephone Encounter (Signed)
Requesting: lorazepam Contract: 03/30/17 UDS: 12/28/17 Last Visit: 12/19/20 Next Visit: none Last Refill: 06/20/20  Please Advise

## 2021-02-02 ENCOUNTER — Encounter: Payer: Self-pay | Admitting: Family Medicine

## 2021-02-06 NOTE — Telephone Encounter (Signed)
scheduled

## 2021-02-09 ENCOUNTER — Other Ambulatory Visit: Payer: Self-pay

## 2021-02-09 ENCOUNTER — Emergency Department (HOSPITAL_BASED_OUTPATIENT_CLINIC_OR_DEPARTMENT_OTHER)
Admission: EM | Admit: 2021-02-09 | Discharge: 2021-02-09 | Disposition: A | Discharge status: Home / Self Care | Payer: Medicare Other | Attending: Emergency Medicine | Admitting: Emergency Medicine

## 2021-02-09 ENCOUNTER — Telehealth (HOSPITAL_BASED_OUTPATIENT_CLINIC_OR_DEPARTMENT_OTHER): Payer: Self-pay | Admitting: Student

## 2021-02-09 ENCOUNTER — Emergency Department (HOSPITAL_BASED_OUTPATIENT_CLINIC_OR_DEPARTMENT_OTHER): Payer: Medicare Other

## 2021-02-09 DIAGNOSIS — M10372 Gout due to renal impairment, left ankle and foot: Secondary | ICD-10-CM | POA: Insufficient documentation

## 2021-02-09 DIAGNOSIS — Z7901 Long term (current) use of anticoagulants: Secondary | ICD-10-CM | POA: Insufficient documentation

## 2021-02-09 DIAGNOSIS — I129 Hypertensive chronic kidney disease with stage 1 through stage 4 chronic kidney disease, or unspecified chronic kidney disease: Secondary | ICD-10-CM | POA: Insufficient documentation

## 2021-02-09 DIAGNOSIS — R944 Abnormal results of kidney function studies: Secondary | ICD-10-CM | POA: Diagnosis not present

## 2021-02-09 DIAGNOSIS — N189 Chronic kidney disease, unspecified: Secondary | ICD-10-CM | POA: Insufficient documentation

## 2021-02-09 DIAGNOSIS — M25572 Pain in left ankle and joints of left foot: Secondary | ICD-10-CM | POA: Diagnosis not present

## 2021-02-09 DIAGNOSIS — M7989 Other specified soft tissue disorders: Secondary | ICD-10-CM | POA: Diagnosis not present

## 2021-02-09 LAB — CBC WITH DIFFERENTIAL/PLATELET
Abs Immature Granulocytes: 0.02 10*3/uL (ref 0.00–0.07)
Basophils Absolute: 0.1 10*3/uL (ref 0.0–0.1)
Basophils Relative: 1 %
Eosinophils Absolute: 0.3 10*3/uL (ref 0.0–0.5)
Eosinophils Relative: 3 %
HCT: 41.1 % (ref 39.0–52.0)
Hemoglobin: 14.1 g/dL (ref 13.0–17.0)
Immature Granulocytes: 0 %
Lymphocytes Relative: 18 %
Lymphs Abs: 1.6 10*3/uL (ref 0.7–4.0)
MCH: 31.6 pg (ref 26.0–34.0)
MCHC: 34.3 g/dL (ref 30.0–36.0)
MCV: 92.2 fL (ref 80.0–100.0)
Monocytes Absolute: 0.7 10*3/uL (ref 0.1–1.0)
Monocytes Relative: 8 %
Neutro Abs: 6.2 10*3/uL (ref 1.7–7.7)
Neutrophils Relative %: 70 %
Platelets: 186 10*3/uL (ref 150–400)
RBC: 4.46 MIL/uL (ref 4.22–5.81)
RDW: 13.1 % (ref 11.5–15.5)
WBC: 8.9 10*3/uL (ref 4.0–10.5)
nRBC: 0 % (ref 0.0–0.2)

## 2021-02-09 LAB — BASIC METABOLIC PANEL
Anion gap: 7 (ref 5–15)
BUN: 18 mg/dL (ref 8–23)
CO2: 26 mmol/L (ref 22–32)
Calcium: 9.7 mg/dL (ref 8.9–10.3)
Chloride: 102 mmol/L (ref 98–111)
Creatinine, Ser: 1.92 mg/dL — ABNORMAL HIGH (ref 0.61–1.24)
GFR, Estimated: 36 mL/min — ABNORMAL LOW (ref >60)
Glucose, Bld: 112 mg/dL — ABNORMAL HIGH (ref 70–99)
Potassium: 3.9 mmol/L (ref 3.5–5.1)
Sodium: 135 mmol/L (ref 135–145)

## 2021-02-09 LAB — URIC ACID: Uric Acid, Serum: 8.9 mg/dL — ABNORMAL HIGH (ref 3.7–8.6)

## 2021-02-09 MED ORDER — PREDNISONE 20 MG PO TABS: 60.0000 mg | ORAL_TABLET | Freq: Every day | ORAL | 0 refills | Status: DC

## 2021-02-09 MED ORDER — TRAMADOL HCL 50 MG PO TABS
50.0000 mg | ORAL_TABLET | Freq: Four times a day (QID) | ORAL | 0 refills | Status: DC | PRN
Start: 2021-02-09 — End: 2021-02-09

## 2021-02-09 MED ORDER — TRAMADOL HCL 50 MG PO TABS: 50.0000 mg | ORAL_TABLET | Freq: Four times a day (QID) | ORAL | 0 refills | Status: AC | PRN

## 2021-02-09 MED ORDER — PREDNISONE 20 MG PO TABS: 60.0000 mg | ORAL_TABLET | Freq: Every day | ORAL | 0 refills | Status: AC

## 2021-02-09 MED ORDER — TRAMADOL HCL 50 MG PO TABS
50.0000 mg | ORAL_TABLET | Freq: Once | ORAL | Status: AC
  Administered 2021-02-09: 50 mg via ORAL
  Filled 2021-02-09: qty 1

## 2021-02-09 NOTE — ED Provider Notes (Signed)
[facility] Provider Note   CSN: [id number] Arrival date & time: 02/09/21  1150     History  Chief Complaint  Patient presents with   Foot Pain    David [name] is a 76 y.o. male.  With past medical history of hypertension, GERD, osteoarthritis, gout who presents to the emergency department with left ankle pain.  Patient states that he began having pain in his left ankle and foot on Tuesday.  He states that he initially ignored this pain however it began getting worse on Thursday and progressively worsened until yesterday when he was having difficulty walking around his house.  He states that he has been intermittently using Voltaren gel which has provided minimal relief of his symptoms.  He denies any recent injuries, fevers, illnesses.  He does have a history of osteoarthritis and usually has pain in his right leg however today is the left.  He does have a history of gout in the left foot.   Foot Pain      Home Medications Prior to Admission medications   Medication Sig Start Date End Date Taking? Authorizing Provider  apixaban (ELIQUIS) 5 MG TABS tablet TAKE ONE TABLET BY MOUTH TWICE A DAY 09/02/20   Minus Breeding, MD  ascorbic acid (VITAMIN C) 1000 MG tablet Take by mouth.    [provider]  atenolol (TENORMIN) 50 MG tablet Take 1 tablet (50 mg total) by mouth 2 (two) times daily. 10/25/20   Mosie Lukes, MD  atorvastatin (LIPITOR) 10 MG tablet TAKE ONE TABLET BY MOUTH EVERY MORNING 09/26/20   Mosie Lukes, MD  clotrimazole-betamethasone (LOTRISONE) lotion Apply topically 2 (two) times daily. For 2 weeks. 11/04/20   Mosie Lukes, MD  colchicine 0.6 MG tablet For gout flares, take 1 tab po once then repeat in 1 hour as needed if pain persists up to a total of 3 doses 05/20/20   Mosie Lukes, MD  FLUoxetine (PROZAC) 10 MG tablet Take 1 tablet (10 mg total) by mouth 3 (three) times daily. 09/02/20   Mosie Lukes, MD   LORazepam (ATIVAN) 1 MG tablet TAKE ONE TABLET BY MOUTH TWICE A DAY AS NEEDED FOR ANXIETY OR SEDATION 01/21/21   Mosie Lukes, MD  [facility] powder Apply topically. 10/18/20   [provider]  omeprazole (PRILOSEC) 20 MG capsule Take 20 mg by mouth daily as needed (acid reflux).     [provider]      Allergies    Cefaclor, Cephalosporins, and Penicillins    Review of Systems   Review of Systems  Constitutional:  Negative for fever.  Musculoskeletal:  Positive for arthralgias, gait problem and joint swelling.  All other systems reviewed and are negative.  Physical Exam Updated Vital Signs BP (!) 157/79 (BP Location: Right Arm)    Pulse (!) 57    Temp 97.7 F (36.5 C) (Oral)    Resp 18    Ht 5\' 11"  (1.803 m)    Wt 114.3 kg    SpO2 95%    BMI 35.15 kg/m  Physical Exam Vitals and nursing note reviewed.  Constitutional:      General: He is not in acute distress.    Appearance: Normal appearance. He is not ill-appearing or toxic-appearing.  HENT:     Head: Normocephalic and atraumatic.  Eyes:     General: No scleral icterus. Cardiovascular:     Pulses: Normal pulses.  Pulmonary:     Effort:  Pulmonary effort is normal. No respiratory distress.  Musculoskeletal:        General: Swelling and tenderness present. No deformity.     Cervical back: Neck supple.     Right lower leg: No edema.     Left lower leg: No edema.     Right ankle: Normal. Normal pulse.     Left ankle: Swelling present. No deformity or ecchymosis. Tenderness present over the lateral malleolus and medial malleolus. Decreased range of motion. Normal pulse.       Legs:     Comments: Swelling noted to the lateral malleolus, tenderness to the medial malleolus.  There is some mild erythema however no evidence of cellulitis.  The joint is not significantly warm or hot to touch.  No wounds present.  Pulses equal bilateral DP.  Skin:    General: Skin is warm and dry.     Capillary Refill: Capillary  refill takes less than 2 seconds.     Findings: Erythema present. No rash.  Neurological:     General: No focal deficit present.     Mental Status: He is alert and oriented to person, place, and time. Mental status is at baseline.  Psychiatric:        Mood and Affect: Mood normal.        Behavior: Behavior normal.        Thought Content: Thought content normal.        Judgment: Judgment normal.    ED Results / Procedures / Treatments   Labs (all labs ordered are listed, but only abnormal results are displayed) Labs Reviewed  BASIC METABOLIC PANEL - Abnormal; Notable for the following components:      Result Value   Glucose, Bld 112 (*)    Creatinine, Ser 1.92 (*)    GFR, Estimated 36 (*)    All other components within normal limits  URIC ACID - Abnormal; Notable for the following components:   Uric Acid, Serum 8.9 (*)    All other components within normal limits  CBC WITH DIFFERENTIAL/PLATELET    EKG None  Radiology DG Ankle Complete Left  Result Date: 02/09/2021 CLINICAL DATA:  Left ankle pain, swelling, and erythema. Gout. EXAM: LEFT ANKLE COMPLETE - 3+ VIEW COMPARISON:  09/07/2017 FINDINGS: There is no evidence of fracture, dislocation, or joint effusion. Mild erosive arthropathy is seen involving the medial aspect of the talus, with adjacent soft tissue calcification which may represent chondrocalcinosis. These findings are unchanged since prior study. IMPRESSION: No acute findings. Stable mild erosive arthropathy involving the medial tibiotalar joint space. Electronically Signed   By: Marlaine Hind M.D.   On: 02/09/2021 13:21    Procedures Procedures   Medications Ordered in ED Medications  traMADol (ULTRAM) tablet 50 mg (50 mg Oral Given 02/09/21 1335)    ED Course/ Medical Decision Making/ A&P                           Medical Decision Making Amount and/or Complexity of Data Reviewed Labs: ordered. Radiology: ordered.  Risk Prescription drug  management.  Patient presents to the ED with complaints of ankle pain. This involves an extensive number of treatment options, and is a complaint that carries with it a low risk of complications and morbidity.   Additional history obtained:  Additional history obtained from: External records from outside source obtained and reviewed including: Previous primary care visits  Lab Results: I Ordered, reviewed, and interpreted labs. Pertinent  results include: CBC without leukocytosis BMP with creatinine 1.92, similar to baseline Uric acid 8.9  Imaging Studies ordered:  I ordered imaging studies which included x-ray of the left ankle.  I independently reviewed & interpreted imaging & am in agreement with radiology impression. Imaging shows: No fracture, dislocation, joint effusion.  Erosive arthropathy of the medial aspect of the talus, with adjacent soft tissue calcification.  Medications  I ordered medication including tramadol for pain Reevaluation of the patient after medication shows that patient improved  ED Course: 76 year old male who presents emergency department with left ankle pain. Given history, exam and workup patient likely has acute gout flare. I have low suspicion for fracture, dislocation, significant ligamentous injury, septic arthritis or autoimmune arthropathy, or gonococcal arthropathy.   Patient has history of gout with similar presentation. Uric acid here 8.9, elevated. He does not take colchicine at home, and has one kidney with CKD so avoids NSAIDs. He does have some swelling and mild erythema, however his presentation is not consistent with a septic joint. There is no warmth, leukocytosis, fever, or exquisite tenderness to palpation. He is also non-toxic in appearance with no active infections to seed to the joint. No overlying cellulitis of the skin.   Given tramadol in ED with improving symptoms. I will provide him with short course of steroids and Tramadol for  pain relief and acute flair. I have instructed him to follow-up with PCP which he verbalizes understanding. Will avoid NSAIDs and Colchicine given renal history and CKD. He verbalizes understanding to avoid NSAIDs.   Dispostion:  After consideration of the diagnostic results and the patients response to treatment, I feel that the patent would benefit from discharge. The patient has been appropriately medically screened and/or stabilized in the ED. I have low suspicion for any other emergent medical condition which would require further screening, evaluation or treatment in the ED or require inpatient management    Final Clinical Impression(s) / ED Diagnoses Final diagnoses:  Acute gout due to renal impairment involving left ankle    Rx / DC Orders ED Discharge Orders          Ordered    traMADol (ULTRAM) 50 MG tablet  Every 6 hours PRN,   Status:  Discontinued        02/09/21 1414    predniSONE (DELTASONE) 20 MG tablet  Daily,   Status:  Discontinued        02/09/21 1414    traMADol (ULTRAM) 50 MG tablet  Every 6 hours PRN,   Status:  Discontinued        02/09/21 [place]              Mickie Hillier, PA-C 02/09/21 [place], [place], DO 02/10/21 [phone]

## 2021-02-09 NOTE — ED Notes (Signed)
Pt informed that he should not drive after taking tramadol, states he will ask neighbor to pick him up

## 2021-02-09 NOTE — ED Notes (Signed)
Discharge instructions reviewed with patient, no further questions.

## 2021-02-09 NOTE — Telephone Encounter (Signed)
Needs medication to different pharmacy

## 2021-02-09 NOTE — ED Triage Notes (Signed)
C/o "ache" in left foot since Wednesday, has worsened since, hx of gout, feels different. Also has right hip pain x 6 months. Ambulatory with slow steady gait to room.

## 2021-02-09 NOTE — Discharge Instructions (Addendum)
In the emergency department for pain in your left ankle today.  It is likely gout.  I am prescribing you pain medication, tramadol for you to use every 6 hours as needed.  You have been prescribed a medication that is considered an opiate. Opiates are pain medications that should be used with caution. It is important that you do not drive while taking this medication as it can cause drowsiness and impaired reaction times. Do not mix this medication with benzodiazepine medications or alcohol as this can cause respiratory depression. Additionally, opiates have addicting properties to them. Please use medication as prescribed by your provider.  I am also giving you a dose of steroids that you will take for the next 5 days.  Please complete the steroid course.  Please return to the emergency department if you have fevers, worsening swelling or redness of the area.

## 2021-02-10 ENCOUNTER — Other Ambulatory Visit: Payer: Self-pay | Admitting: Family Medicine

## 2021-02-10 ENCOUNTER — Telehealth: Payer: Self-pay

## 2021-02-10 ENCOUNTER — Other Ambulatory Visit: Payer: Self-pay

## 2021-02-10 ENCOUNTER — Telehealth: Payer: Self-pay | Admitting: *Deleted

## 2021-02-10 ENCOUNTER — Other Ambulatory Visit (HOSPITAL_BASED_OUTPATIENT_CLINIC_OR_DEPARTMENT_OTHER): Payer: Self-pay

## 2021-02-10 DIAGNOSIS — I2510 Atherosclerotic heart disease of native coronary artery without angina pectoris: Secondary | ICD-10-CM

## 2021-02-10 MED ORDER — METHYLPREDNISOLONE 4 MG PO TBPK
ORAL_TABLET | ORAL | 0 refills | Status: DC
  Filled 2021-02-10: qty 21, 6d supply, fill #0

## 2021-02-10 NOTE — Telephone Encounter (Signed)
° °  Pre-operative Risk Assessment    Patient Name: David [name]  DOB: Apr 16, 1945 MRN: [id number]      Request for Surgical Clearance    Procedure:   TRUS PROSTATE Bx  Date of Surgery:  Clearance 03/12/21                                 Surgeon:  DR. Leonia Reader [facility] Surgeon's Group or Practice Name:  [facility] Phone number:  [phone] Fax number:  [phone]   Type of Clearance Requested:   - Medical  - Pharmacy:  Hold Apixaban (Eliquis) x 48 HOURS PRIOR   Type of Anesthesia:  Not Indicated   Additional requests/questions:    [name]   02/10/2021, 5:41 PM

## 2021-02-10 NOTE — Telephone Encounter (Signed)
Nurse Assessment Nurse: Tressia Danas, RN, [place] Date/Time (Eastern Time): 02/09/2021 4:22:27 PM Confirm and document reason for call. If symptomatic, describe symptoms. ---Caller states he went to the ER for his left foot which is in a lot of pain. They said it was probably gout. Gave him medication for pain and swelling. He needed to contact the doctor for a question about his medication. caller states they did not give him the medication for gout due to him only having one kidney. caller states he went to the ED about 1.5 hours ago. caller is wanting to know if he has been on gout medication since he lost a kidney. caller states he was given tramadol for pain in the ED and Rx are not at the pharmacy now. Does the patient have any new or worsening symptoms? ---No Please document clinical information provided and list any resource used. ---caller states he will get his medications that were ordered by the ED doc this afternoon and will call the office in the morning to discuss possible gout medication with his PCP. Ed would not give him the gout medication due to him only having 1 kidney. Disp. Time Eilene Ghazi Time) Disposition Final User 02/09/2021 4:29:33 PM Clinical Call Yes [place], RN, Earnest Bailey

## 2021-02-11 DIAGNOSIS — I2510 Atherosclerotic heart disease of native coronary artery without angina pectoris: Secondary | ICD-10-CM | POA: Insufficient documentation

## 2021-02-11 NOTE — Telephone Encounter (Signed)
Patient with diagnosis of afib on Eliquis for anticoagulation.    Procedure: Trus prostate biopsy Date of procedure: 03/12/21  CHA2DS2-VASc Score = 4  This indicates a 4.8% annual risk of stroke. The patient's score is based upon: CHF History: 0 HTN History: 1 Diabetes History: 0 Stroke History: 0 Vascular Disease History: 1 Age Score: 2 Gender Score: 0   CAD noted on CT angio 10/2020, problem list has been updated  CrCl 11mL/min using adjusted body weight Platelet count 186K  Per office protocol, patient can hold Eliquis for 2 days prior to procedure as requested.

## 2021-02-11 NOTE — Telephone Encounter (Signed)
Attempted to contact patient as part of preoperative protocol.  Left message to call back.  11:05 AM on 02/11/2021.  Patient needs call back.

## 2021-02-13 NOTE — Telephone Encounter (Signed)
Left a message for the patient to call back and speak to the on-call preop APP of the day 

## 2021-02-19 NOTE — Telephone Encounter (Addendum)
° °  Name: JAVIEN TESCH  DOB: 08/19/1945  MRN: [id number]   Primary Cardiologist: Minus Breeding, MD  Chart reviewed as part of pre-operative protocol coverage. Patient was contacted 02/19/2021 in reference to pre-operative risk assessment for pending surgery as outlined below.  JSAON YOO was last seen 08/2020 by Dr. Percival Spanish , primarily folloewd for atrial fib and LVH. Echo 09/2020 Ef 60-65%, moderate BAE, moderate dilation of ascending aorta, dilated IVC. F/u CTA in 1 year was planned.  Prior CTA 10/2020 showed 4.3cm ascending TAA, also coronary calcification amongst other findings as noted. Remote nuc 2007. RCRI calculated at 0.4% indicating low CV risk (additionally prostate bx is generally low risk procedure) but need to verify with patient no recent issues.  I tried to call patient but VM box is full. Will try sending MyChart message. We do not have a DPR on file for him for our office. Will route update to requesting surgeon that we have been trying to contact patient but have not been successful yet. If he does not return MyChart message within the next 2 days will need to close out clearance awaiting callback from him.   Charlie Pitter, PA-C 02/19/2021, 4:22 PM

## 2021-02-20 ENCOUNTER — Ambulatory Visit (INDEPENDENT_AMBULATORY_CARE_PROVIDER_SITE_OTHER): Payer: Medicare Other

## 2021-02-20 VITALS — BP 130/86 | HR 56 | Temp 97.6°F | Resp 16 | Ht 72.0 in | Wt 252.0 lb

## 2021-02-20 DIAGNOSIS — Z0000 Encounter for general adult medical examination without abnormal findings: Secondary | ICD-10-CM

## 2021-02-20 NOTE — Progress Notes (Signed)
Subjective:   David [name] is a 76 y.o. male who presents for Medicare Annual/Subsequent preventive examination.  Review of Systems     Cardiac Risk Factors include: advanced age (>1men, >55 women);hypertension;dyslipidemia;obesity (BMI >30kg/m2);male gender     Objective:    Today's Vitals   02/20/21 1009 02/20/21 1015  BP: 130/86   Pulse: (!) 56   Resp: 16   Temp: 97.6 F (36.4 C)   TempSrc: Oral   SpO2: 95%   Weight: 252 lb (114.3 kg)   Height: 6' (1.829 m)   PainSc:  4    Body mass index is 34.18 kg/m.  Advanced Directives 02/20/2021 02/09/2021 12/09/2020 09/23/2020 09/15/2020 02/15/2020 08/20/2019  Does Patient Have a Medical Advance Directive? No No Yes No No No No  Does patient want to make changes to medical advance directive? - - No - Patient declined - - - -  Would patient like information on creating a medical advance directive? No - Patient declined No - Patient declined - No - Patient declined - Yes (MAU/Ambulatory/Procedural Areas - Information given) Yes (ED - Information included in AVS)    Current Medications (verified) Outpatient Encounter Medications as of 02/20/2021  Medication Sig   apixaban (ELIQUIS) 5 MG TABS tablet TAKE ONE TABLET BY MOUTH TWICE A DAY   ascorbic acid (VITAMIN C) 1000 MG tablet Take by mouth.   atenolol (TENORMIN) 50 MG tablet Take 1 tablet (50 mg total) by mouth 2 (two) times daily.   atorvastatin (LIPITOR) 10 MG tablet TAKE ONE TABLET BY MOUTH EVERY MORNING   clotrimazole-betamethasone (LOTRISONE) lotion Apply topically 2 (two) times daily. For 2 weeks.   FLUoxetine (PROZAC) 10 MG tablet Take 1 tablet (10 mg total) by mouth 3 (three) times daily.   LORazepam (ATIVAN) 1 MG tablet TAKE ONE TABLET BY MOUTH TWICE A DAY AS NEEDED FOR ANXIETY OR SEDATION   NYAMYC powder Apply topically.   omeprazole (PRILOSEC) 20 MG capsule Take 20 mg by mouth daily as needed (acid reflux).    [DISCONTINUED] methylPREDNISolone (MEDROL DOSEPAK) 4 MG TBPK  tablet Follow package directions   colchicine 0.6 MG tablet For gout flares, take 1 tab po once then repeat in 1 hour as needed if pain persists up to a total of 3 doses (Patient not taking: Reported on 02/20/2021)   No facility-administered encounter medications on file as of 02/20/2021.    Allergies (verified) Cefaclor, Cephalosporins, and Penicillins   History: Past Medical History:  Diagnosis Date   Adenoma of right adrenal gland 07/11/2002   2.4cm , noted on CT ABD   Allergy    Anxiety    Arthritis of both knees 03/26/2016   Astigmatism    Back pain    BCC (basal cell carcinoma of skin)    under right eye and right ear   Blood transfusion without reported diagnosis    Cataract 09/29/2016   Depression    Diverticulitis 2009   Diverticulosis    Family history of breast cancer    Family history of colon cancer    Fatty liver    GERD (gastroesophageal reflux disease)    Gout    Hearing loss of both ears 03/26/2016   History of atrial fibrillation    x 2 none since 2020   History of chronic prostatitis    started at age 16   History of kidney stones    Hyperglycemia    Hyperlipidemia    Hypertension    Incomplete right bundle branch  block (RBBB)    Internal hemorrhoids    Kidney lesion 06/07/2015   Right midportion, 1.1x1.1 cm hyperechoic, noted on [place] ABD   LAFB (left anterior fascicular block)    Lipoma of axilla 09/2016   Liver lesion, right lobe 06/07/2015   2.5x2.4x2.4 cm hypoechoic lesion posterior aspect, noted on [place] ABD   Low back pain 03/26/2016   LVH (left ventricular hypertrophy) 08/06/2017   Moderate, Noted on ECHO   Medicare annual wellness visit, subsequent 03/12/2014   OA (osteoarthritis)    Back, Hands, Neck   Obesity 11/25/2007   Qualifier: Diagnosis of  By: Lenna Gilford MD, Deborra Medina    Other malaise and fatigue 03/13/2013   Premature ventricular complex    Prostate cancer ([place]) dx 2021   Renal insufficiency    Kidney removed right   Scoliosis     Upper thoracic and lumbar   Sinus bradycardia 08/06/2017   Noted on EKG   Vitamin D deficiency    resolved   Past Surgical History:  Procedure Laterality Date   CARDIOVERSION  07/31/2017   CATARACT EXTRACTION, BILATERAL     COLON SURGERY  2009   segmental sigmoid resection   COLONOSCOPY     CYSTOSCOPY N/A 07/28/2019   Procedure: CYSTOSCOPY FLEXIBLE;  Surgeon: Cleon Gustin, MD;  Location: [facility];  Service: Urology;  Laterality: N/A;   CYSTOSCOPY W/ RETROGRADES Right 06/07/2018   Procedure: CYSTOSCOPY WITH RETROGRADE PYELOGRAM, uphrostogram;  Surgeon: Cleon Gustin, MD;  Location: [facility];  Service: Urology;  Laterality: Right;   CYSTOSCOPY WITH URETEROSCOPY AND STENT PLACEMENT Right 02/28/2018   Procedure: CYSTOSCOPY WITH URETEROSCOPY [place];  Surgeon: Kathie Rhodes, MD;  Location: [facility];  Service: Urology;  Laterality: Right;   CYSTOSCOPY/URETEROSCOPY/HOLMIUM LASER/STENT PLACEMENT Right 11/29/2017   Procedure: CYSTOSCOPY/RETROGRADE/URETEROSCOPY/HOLMIUM LASER/STENT PLACEMENT;  Surgeon: Kathie Rhodes, MD;  Location: [facility];  Service: Urology;  Laterality: Right;   EYE SURGERY Bilateral 01/12/2017   cataract removal   history of blood tranfusion  age 76   HOLMIUM LASER APPLICATION Right 04/07/7122   Procedure: HOLMIUM LASER APPLICATION;  Surgeon: Kathie Rhodes, MD;  Location[facility];  Service: Urology;  Laterality: Right;   IR BALLOON DILATION URETERAL STRICTURE RIGHT  03/14/2018   IR NEPHRO TUBE REMOV/FL  03/17/2018   IR NEPHROSTOGRAM RIGHT THRU EXISTING ACCESS  03/17/2018   IR NEPHROSTOMY EXCHANGE RIGHT  03/14/2018   IR NEPHROSTOMY EXCHANGE RIGHT  06/21/2018   IR NEPHROSTOMY PLACEMENT RIGHT  03/02/2018   IR NEPHROSTOMY PLACEMENT RIGHT  04/20/2018   IR URETERAL STENT PLACEMENT EXISTING ACCESS RIGHT  03/14/2018   NOSE SURGERY     Submucous resection age 26   NUCLEAR STRESS TEST  06/03/2009   RADIOACTIVE SEED IMPLANT N/A  07/28/2019   Procedure: RADIOACTIVE SEED IMPLANT/BRACHYTHERAPY IMPLANT;  Surgeon: Cleon Gustin, MD;  Location: [facility];  Service: Urology;  Laterality: N/A;  90 MINS   ROBOT ASSISTED LAPAROSCOPIC NEPHRECTOMY Right 08/04/2018   Procedure: XI ROBOTIC ASSISTED LAPAROSCOPIC NEPHRECTOMY;  Surgeon: Cleon Gustin, MD;  Location: [facility];  Service: Urology;  Laterality: Right;  3 HRS   ROBOT ASSISTED PYELOPLASTY Right 06/07/2018   Procedure: attempted XI ROBOTIC ASSISTED PYELOPLASTY, lysis of adhesions;  Surgeon: Cleon Gustin, MD;  Location: [facility];  Service: Urology;  Laterality: Right;  3 HRS   SKIN BIOPSY     SPACE OAR INSTILLATION N/A 07/28/2019   Procedure: SPACE OAR INSTILLATION;  Surgeon: Cleon Gustin, MD;  Location: [place];  Service: Urology;  Laterality: N/A;   URETEROSCOPY WITH HOLMIUM LASER LITHOTRIPSY Right 09/24/2017   Procedure: CYSTOSCOPY, URETEROSCOPY WITH HOLMIUM LASER LITHOTRIPSY, STENT PLACEMENT;  Surgeon: Kathie Rhodes, MD;  Location: [facility];  Service: Urology;  Laterality: Right;   Family History  Problem Relation Age of Onset   Heart disease Mother    Hypertension Mother    Stroke Mother    Colon cancer Mother 63   Breast cancer Mother 85   Heart disease Father        pacemaker   Aortic aneurysm Father    Hypertension Father    Lung cancer Father 43   Heart disease Sister    Atrial fibrillation Sister    Obesity Sister    Sleep apnea Sister    Heart attack Brother    Other Brother        muscle disease   Arthritis Brother    Stroke Brother    Atrial fibrillation Brother    Heart attack Brother    Atrial fibrillation Brother    Diabetes Brother    Atrial fibrillation Brother    Heart attack Brother    Hypertension Brother    Hyperlipidemia Brother    Heart attack Brother    Other Brother        heart valve operation   Atrial fibrillation Brother    Heart attack Maternal  Grandmother    Diabetes Maternal Grandmother    Cancer Maternal Grandfather 59       hodgin's lymphoma   Heart attack Paternal Grandmother    Anxiety disorder Paternal Grandmother    Pneumonia Paternal Grandfather    Liver cancer Nephew 2       great nephew; d. 40   Lymphoma Niece 55   Esophageal cancer Neg Hx    Rectal cancer Neg Hx    Stomach cancer Neg Hx    Social History   Socioeconomic History   Marital status: Married    Spouse name: Not on file   Number of children: 3   Years of education: Not on file   Highest education level: Not on file  Occupational History   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[employer]    Employer: [employer]  Tobacco Use   Smoking status: Former    Packs/day: 1.00    Years: 6.00    Pack years: 6.00    Types: Cigarettes    Start date: 01/11/1969    Quit date: 01/12/1969    Years since quitting: 52.1   Smokeless tobacco: Never   Tobacco comments:    1965-1971  Vaping Use   Vaping Use: Never used  Substance and Sexual Activity   Alcohol use: Not Currently    Comment: occ   Drug use: Never   Sexual activity: Yes  Other Topics Concern   Not on file  Social History Narrative   The patient is married for the second time, he has 3 sons.   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   2 alcoholic beverages most days.   1 caffeinated beverage daily   No drug use no current tobacco use he is a prior smoker   12/24/2016   Social Determinants of Health   Financial Resource Strain: Low Risk    Difficulty of Paying Living Expenses: Not hard at all  Food Insecurity: No Food Insecurity   Worried About Charity fundraiser in the Last Year: Never true   [place] in the Last  Year: Never true  Transportation Needs: No Transportation Needs   Lack of Transportation (Medical): No   Lack of Transportation (Non-Medical): No  Physical Activity: Insufficiently Active   Days of Exercise per Week: 5 days   Minutes of Exercise per Session: 20 min  Stress: Stress Concern Present    Feeling of Stress : To some extent  Social Connections: Moderately Isolated   Frequency of Communication with Friends and Family: More than three times a week   Frequency of Social Gatherings with Friends and Family: Twice a week   Attends Religious Services: Never   Marine scientist or Organizations: No   Attends Music therapist: Never   Marital Status: Married    Tobacco Counseling Counseling given: Not Answered Tobacco comments: [phone]   Clinical Intake:  Pre-visit preparation completed: Yes  Pain : 0-10 Pain Score: 4  Pain Type: Chronic pain Pain Location: Hip (and back) Pain Onset: More than a month ago Pain Frequency: Constant     BMI - recorded: 34.18 Nutritional Status: BMI > 30  Obese Nutritional Risks: None Diabetes: No  How often do you need to have someone help you when you read instructions, pamphlets, or other written materials from your doctor or pharmacy?: 1 - Never  Diabetic?No  Interpreter Needed?: No  Information entered by :: Caroleen Hamman LPN   Activities of Daily Living In your present state of health, do you have any difficulty performing the following activities: 02/20/2021  Hearing? Y  Vision? N  Difficulty concentrating or making decisions? N  Walking or climbing stairs? N  Dressing or bathing? N  Doing errands, shopping? N  Preparing Food and eating ? N  Using the Toilet? N  In the past six months, have you accidently leaked urine? Y  Do you have problems with loss of bowel control? N  Managing your Medications? N  Managing your Finances? N  Housekeeping or managing your Housekeeping? N  Some recent data might be hidden    Patient Care Team: Mosie Lukes, MD as PCP - General (Family Medicine) Minus Breeding, MD as PCP - Cardiology (Cardiology) Linward Natal, MD as Consulting Physician (Ophthalmology) Danella Sensing, MD as Consulting Physician (Dermatology) Cira Rue, RN Nurse Navigator as  Registered Nurse ([place], [facility]  Indicate any recent Medical Services you may have received from other than [facility] providers in the past year (date may be approximate).     Assessment:   This is a routine wellness examination for David [name].  Hearing/Vision screen Hearing Screening - Comments:: Patient states he does have some hearing loss-has hearng aids but does not wear them often. Vision Screening - Comments:: Last eye exam-2022-has an upcoming appt  Dietary issues and exercise activities discussed: Current Exercise Habits: Home exercise routine, Type of exercise: walking, Time (Minutes): 20, Frequency (Times/Week): 5, Weekly Exercise (Minutes/Week): 100, Intensity: Mild, Exercise limited by: None identified   Goals Addressed             This Visit's Progress    DIET - INCREASE WATER INTAKE   On track    Increase physical activity   On track      Depression Screen PHQ 2/9 Scores 02/20/2021 12/19/2020 09/02/2020 02/15/2020 02/12/2020 10/12/2019 02/10/2019  PHQ - 2 Score 1 2 1 3 6 3  0  PHQ- 9 Score - 11 10 5 12 10  -  Exception Documentation - - - - - - -    Fall Risk Fall Risk  02/20/2021 02/15/2020 02/12/2020 02/10/2019 02/03/2018  Falls in the past year? 0 1 0 0 0  Number falls in past yr: 0 0 0 0 -  Injury with Fall? 0 0 0 0 -  Comment - - - - -  Risk for fall due to : - - - - -  Follow up Falls prevention discussed Falls prevention discussed - Education provided;Falls prevention discussed -    FALL RISK PREVENTION PERTAINING TO THE HOME:  Any stairs in or around the home? Yes  If so, are there any without handrails? No  Home free of loose throw rugs in walkways, pet beds, electrical cords, etc? Yes  Adequate lighting in your home to reduce risk of falls? Yes   ASSISTIVE DEVICES UTILIZED TO PREVENT FALLS:  Life alert? No  Use of a cane, walker or w/c? No  Grab bars in the bathroom? No  Shower chair or bench in shower? Yes  Elevated  toilet seat or a handicapped toilet? No   TIMED UP AND GO:  Was the test performed? Yes .  Length of time to ambulate 10 feet: 11 sec.   Gait steady and fast without use of assistive device  Cognitive Function:Normal cognitive status assessed by direct observation this Nurse Health Advisor. No abnormalities found.       6CIT Screen 02/15/2020  What Year? 0 points  What month? 0 points  What time? 0 points  Count back from 20 0 points  Months in reverse 0 points  Repeat phrase 0 points  Total Score 0    Immunizations Immunization History  Administered Date(s) Administered   Fluad Quad(high Dose 65+) 10/12/2019   Influenza Split 10/02/2015   Influenza, High Dose Seasonal PF 09/29/2016, 11/15/2017, 09/07/2018   Influenza,inj,Quad PF,6+ Mos 09/14/2012, 09/25/2013, 03/07/2015   Influenza-Unspecified 11/12/2020   PFIZER(Purple Top)SARS-COV-2 Vaccination 03/10/2019, 03/28/2019, 01/02/2020, 05/01/2020   Pneumococcal Conjugate-13 09/05/2012   Pneumococcal Polysaccharide-23 08/30/2014   Tdap 09/05/2012    TDAP status: Up to date  Flu Vaccine status: Up to date  Pneumococcal vaccine status: Up to date  Covid-19 vaccine status: Information provided on how to obtain vaccines.   Qualifies for Shingles Vaccine? Yes   Zostavax completed No   Shingrix Completed?: No.    Education has been provided regarding the importance of this vaccine. Patient has been advised to call insurance company to determine out of pocket expense if they have not yet received this vaccine. Advised may also receive vaccine at local pharmacy or Health Dept. Verbalized acceptance and understanding.  Screening Tests Health Maintenance  Topic Date Due   Zoster Vaccines- Shingrix (1 of 2) Never done   COLONOSCOPY (Pts 45-45yrs Insurance coverage will need to be confirmed)  09/08/2017   COVID-19 Vaccine (5 - Booster for Pfizer series) 06/26/2020   TETANUS/TDAP  09/06/2022   Pneumonia Vaccine 35+ Years old   Completed   INFLUENZA VACCINE  Completed   Hepatitis C Screening  Completed   HPV VACCINES  Aged Out    Health Maintenance  Health Maintenance Due  Topic Date Due   Zoster Vaccines- Shingrix (1 of 2) Never done   COLONOSCOPY (Pts 45-67yrs Insurance coverage will need to be confirmed)  09/08/2017   COVID-19 Vaccine (5 - Booster for [place] series) 06/26/2020    Colorectal cancer screening: Declined today   Lung Cancer Screening: (Low Dose CT Chest recommended if Age 52-80 years, 30 pack-year currently smoking OR have quit w/in 15years.) does not qualify.     Additional  Screening:  Hepatitis C Screening: Completed 05/04/2014  Vision Screening: Recommended annual ophthalmology exams for early detection of glaucoma and other disorders of the eye. Is the patient up to date with their annual eye exam?  No  Patient has an upcoming appt.   Dental Screening: Recommended annual dental exams for proper oral hygiene  Community Resource Referral / Chronic Care Management: CRR required this visit?  No   CCM required this visit?  No      Plan:     I have personally reviewed and noted the following in the patients chart:   Medical and social history Use of alcohol, tobacco or illicit drugs  Current medications and supplements including opioid prescriptions. Patient is not currently taking opioid prescriptions. Functional ability and status Nutritional status Physical activity Advanced directives List of other physicians Hospitalizations, surgeries, and ER visits in previous 12 months Vitals Screenings to include cognitive, depression, and falls Referrals and appointments  In addition, I have reviewed and discussed with patient certain preventive protocols, quality metrics, and best practice recommendations. A written personalized care plan for preventive services as well as general preventive health recommendations were provided to patient.   Patient would like to access avs  on mychart.  Marta Antu, LPN   02/15/5571  Nurse Health Advisor  Nurse Notes: None

## 2021-02-20 NOTE — Patient Instructions (Signed)
David [name] , Thank you for taking time to come for your Medicare Wellness Visit. I appreciate your ongoing commitment to your health goals. Please review the following plan we discussed and let me know if I can assist you in the future.   Screening recommendations/referrals: Colonoscopy: Per our conversation, you will schedule when you are ready. Recommended yearly ophthalmology/optometry visit for glaucoma screening and checkup Recommended yearly dental visit for hygiene and checkup  Vaccinations: Influenza vaccine: Up to date Pneumococcal vaccine: Up to date Tdap vaccine: Up to date Shingles vaccine: Due-May obtain vaccine at your local pharmacy. Covid-19: Booster available at the pharmacy  Advanced directives: Declined information today  Conditions/risks identified: See problem list  Next appointment: Follow up in one year for your annual wellness visit. 02/26/2022 @ 1:00.  Preventive Care 81 Years and Older, Male Preventive care refers to lifestyle choices and visits with your health care provider that can promote health and wellness. What does preventive care include? A yearly physical exam. This is also called an annual well check. Dental exams once or twice a year. Routine eye exams. Ask your health care provider how often you should have your eyes checked. Personal lifestyle choices, including: Daily care of your teeth and gums. Regular physical activity. Eating a healthy diet. Avoiding tobacco and drug use. Limiting alcohol use. Practicing safe sex. Taking low doses of aspirin every day. Taking vitamin and mineral supplements as recommended by your health care provider. What happens during an annual well check? The services and screenings done by your health care provider during your annual well check will depend on your age, overall health, lifestyle risk factors, and family history of disease. Counseling  Your health care provider may ask you questions about  your: Alcohol use. Tobacco use. Drug use. Emotional well-being. Home and relationship well-being. Sexual activity. Eating habits. History of falls. Memory and ability to understand (cognition). Work and work Statistician. Screening  You may have the following tests or measurements: Height, weight, and BMI. Blood pressure. Lipid and cholesterol levels. These may be checked every 5 years, or more frequently if you are over 38 years old. Skin check. Lung cancer screening. You may have this screening every year starting at age 56 if you have a 30-pack-year history of smoking and currently smoke or have quit within the past 15 years. Fecal occult blood test (FOBT) of the stool. You may have this test every year starting at age 67. Flexible sigmoidoscopy or colonoscopy. You may have a sigmoidoscopy every 5 years or a colonoscopy every 10 years starting at age 5. Prostate cancer screening. Recommendations will vary depending on your family history and other risks. Hepatitis C blood test. Hepatitis B blood test. Sexually transmitted disease (STD) testing. Diabetes screening. This is done by checking your blood sugar (glucose) after you have not eaten for a while (fasting). You may have this done every 1-3 years. Abdominal aortic aneurysm (AAA) screening. You may need this if you are a current or former smoker. Osteoporosis. You may be screened starting at age 53 if you are at high risk. Talk with your health care provider about your test results, treatment options, and if necessary, the need for more tests. Vaccines  Your health care provider may recommend certain vaccines, such as: Influenza vaccine. This is recommended every year. Tetanus, diphtheria, and acellular pertussis (Tdap, Td) vaccine. You may need a Td booster every 10 years. Zoster vaccine. You may need this after age 43. Pneumococcal 13-valent conjugate (PCV13) vaccine. One  dose is recommended after age 63. Pneumococcal  polysaccharide (PPSV23) vaccine. One dose is recommended after age 71. Talk to your health care provider about which screenings and vaccines you need and how often you need them. This information is not intended to replace advice given to you by your health care provider. Make sure you discuss any questions you have with your health care provider. Document Released: 01/25/2015 Document Revised: 09/18/2015 Document Reviewed: 10/30/2014 Elsevier Interactive Patient Education  2017 [employer] Prevention in the Home Falls can cause injuries. They can happen to people of all ages. There are many things you can do to make your home safe and to help prevent falls. What can I do on the outside of my home? Regularly fix the edges of walkways and driveways and fix any cracks. Remove anything that might make you trip as you walk through a door, such as a raised step or threshold. Trim any bushes or trees on the path to your home. Use bright outdoor lighting. Clear any walking paths of anything that might make someone trip, such as rocks or tools. Regularly check to see if handrails are loose or broken. Make sure that both sides of any steps have handrails. Any raised decks and porches should have guardrails on the edges. Have any leaves, snow, or ice cleared regularly. Use sand or salt on walking paths during winter. Clean up any spills in your garage right away. This includes oil or grease spills. What can I do in the bathroom? Use night lights. Install grab bars by the toilet and in the tub and shower. Do not use towel bars as grab bars. Use non-skid mats or decals in the tub or shower. If you need to sit down in the shower, use a plastic, non-slip stool. Keep the floor dry. Clean up any water that spills on the floor as soon as it happens. Remove soap buildup in the tub or shower regularly. Attach bath mats securely with double-sided non-slip rug tape. Do not have throw rugs and other  things on the floor that can make you trip. What can I do in the bedroom? Use night lights. Make sure that you have a light by your bed that is easy to reach. Do not use any sheets or blankets that are too big for your bed. They should not hang down onto the floor. Have a firm chair that has side arms. You can use this for support while you get dressed. Do not have throw rugs and other things on the floor that can make you trip. What can I do in the kitchen? Clean up any spills right away. Avoid walking on wet floors. Keep items that you use a lot in easy-to-reach places. If you need to reach something above you, use a strong step stool that has a grab bar. Keep electrical cords out of the way. Do not use floor polish or wax that makes floors slippery. If you must use wax, use non-skid floor wax. Do not have throw rugs and other things on the floor that can make you trip. What can I do with my stairs? Do not leave any items on the stairs. Make sure that there are handrails on both sides of the stairs and use them. Fix handrails that are broken or loose. Make sure that handrails are as long as the stairways. Check any carpeting to make sure that it is firmly attached to the stairs. Fix any carpet that is loose or worn.  Avoid having throw rugs at the top or bottom of the stairs. If you do have throw rugs, attach them to the floor with carpet tape. Make sure that you have a light switch at the top of the stairs and the bottom of the stairs. If you do not have them, ask someone to add them for you. What else can I do to help prevent falls? Wear shoes that: Do not have high heels. Have rubber bottoms. Are comfortable and fit you well. Are closed at the toe. Do not wear sandals. If you use a stepladder: Make sure that it is fully opened. Do not climb a closed stepladder. Make sure that both sides of the stepladder are locked into place. Ask someone to hold it for you, if possible. Clearly  mark and make sure that you can see: Any grab bars or handrails. First and last steps. Where the edge of each step is. Use tools that help you move around (mobility aids) if they are needed. These include: Canes. Walkers. Scooters. Crutches. Turn on the lights when you go into a dark area. Replace any light bulbs as soon as they burn out. Set up your furniture so you have a clear path. Avoid moving your furniture around. If any of your floors are uneven, fix them. If there are any pets around you, be aware of where they are. Review your medicines with your doctor. Some medicines can make you feel dizzy. This can increase your chance of falling. Ask your doctor what other things that you can do to help prevent falls. This information is not intended to replace advice given to you by your health care provider. Make sure you discuss any questions you have with your health care provider. Document Released: 10/25/2008 Document Revised: 06/06/2015 Document Reviewed: 02/02/2014 Elsevier Interactive Patient Education  2017 [employer].

## 2021-02-24 NOTE — Telephone Encounter (Signed)
° °  Patient Name: David [name]  DOB: Oct 01, 1945 MRN: [id number]  Primary Cardiologist: Minus Breeding, MD  Chart reviewed as part of pre-operative protocol coverage. Despite multiple attempts over the last several weeks, we have been unable to reach patient either by phone or by MyChart.  Unable to reach patient again today.  Will route this update to requesting surgeon to let them know we were unable to reach him to provide pre-op cardiac recommendations - recommend that if they speak with him to please ask him to call our office at which time we can re-open this clearance request. Will otherwise remove from preop box.  Charlie Pitter, PA-C 02/24/2021, 10:12 AM

## 2021-02-26 ENCOUNTER — Other Ambulatory Visit: Payer: Self-pay

## 2021-02-26 ENCOUNTER — Other Ambulatory Visit: Payer: Self-pay | Admitting: Family Medicine

## 2021-02-26 DIAGNOSIS — F418 Other specified anxiety disorders: Secondary | ICD-10-CM

## 2021-02-26 MED ORDER — FLUOXETINE HCL 10 MG PO TABS: 10.0000 mg | ORAL_TABLET | Freq: Three times a day (TID) | ORAL | 1 refills | Status: DC

## 2021-03-03 ENCOUNTER — Other Ambulatory Visit: Payer: Self-pay | Admitting: Cardiology

## 2021-03-03 NOTE — Telephone Encounter (Signed)
Prescription refill request for Eliquis received. Indication:Afib Last office visit:8/22 Scr:1.9 Age: 76 Weight:114.3 kg  Prescription refilled

## 2021-03-05 DIAGNOSIS — H02834 Dermatochalasis of left upper eyelid: Secondary | ICD-10-CM | POA: Diagnosis not present

## 2021-03-05 DIAGNOSIS — H52203 Unspecified astigmatism, bilateral: Secondary | ICD-10-CM | POA: Diagnosis not present

## 2021-03-05 DIAGNOSIS — H0100A Unspecified blepharitis right eye, upper and lower eyelids: Secondary | ICD-10-CM | POA: Diagnosis not present

## 2021-03-05 DIAGNOSIS — H43812 Vitreous degeneration, left eye: Secondary | ICD-10-CM | POA: Diagnosis not present

## 2021-03-05 DIAGNOSIS — H0100B Unspecified blepharitis left eye, upper and lower eyelids: Secondary | ICD-10-CM | POA: Diagnosis not present

## 2021-03-05 DIAGNOSIS — H0288B Meibomian gland dysfunction left eye, upper and lower eyelids: Secondary | ICD-10-CM | POA: Diagnosis not present

## 2021-03-05 DIAGNOSIS — Z961 Presence of intraocular lens: Secondary | ICD-10-CM | POA: Diagnosis not present

## 2021-03-05 DIAGNOSIS — H02831 Dermatochalasis of right upper eyelid: Secondary | ICD-10-CM | POA: Diagnosis not present

## 2021-03-05 DIAGNOSIS — H26493 Other secondary cataract, bilateral: Secondary | ICD-10-CM | POA: Diagnosis not present

## 2021-03-05 DIAGNOSIS — H0288A Meibomian gland dysfunction right eye, upper and lower eyelids: Secondary | ICD-10-CM | POA: Diagnosis not present

## 2021-03-05 DIAGNOSIS — H35361 Drusen (degenerative) of macula, right eye: Secondary | ICD-10-CM | POA: Diagnosis not present

## 2021-03-05 DIAGNOSIS — H524 Presbyopia: Secondary | ICD-10-CM | POA: Diagnosis not present

## 2021-03-11 ENCOUNTER — Encounter (HOSPITAL_BASED_OUTPATIENT_CLINIC_OR_DEPARTMENT_OTHER): Payer: Self-pay

## 2021-03-11 ENCOUNTER — Emergency Department (HOSPITAL_BASED_OUTPATIENT_CLINIC_OR_DEPARTMENT_OTHER)
Admission: EM | Admit: 2021-03-11 | Discharge: 2021-03-12 | Disposition: A | Discharge status: Home / Self Care | Payer: Medicare Other | Attending: Emergency Medicine | Admitting: Emergency Medicine

## 2021-03-11 ENCOUNTER — Emergency Department (HOSPITAL_BASED_OUTPATIENT_CLINIC_OR_DEPARTMENT_OTHER): Payer: Medicare Other

## 2021-03-11 ENCOUNTER — Other Ambulatory Visit: Payer: Self-pay

## 2021-03-11 DIAGNOSIS — Z7901 Long term (current) use of anticoagulants: Secondary | ICD-10-CM | POA: Insufficient documentation

## 2021-03-11 DIAGNOSIS — R0789 Other chest pain: Secondary | ICD-10-CM | POA: Diagnosis not present

## 2021-03-11 DIAGNOSIS — Z79899 Other long term (current) drug therapy: Secondary | ICD-10-CM | POA: Insufficient documentation

## 2021-03-11 DIAGNOSIS — R519 Headache, unspecified: Secondary | ICD-10-CM | POA: Diagnosis not present

## 2021-03-11 DIAGNOSIS — I4891 Unspecified atrial fibrillation: Secondary | ICD-10-CM | POA: Insufficient documentation

## 2021-03-11 DIAGNOSIS — I129 Hypertensive chronic kidney disease with stage 1 through stage 4 chronic kidney disease, or unspecified chronic kidney disease: Secondary | ICD-10-CM | POA: Insufficient documentation

## 2021-03-11 DIAGNOSIS — N189 Chronic kidney disease, unspecified: Secondary | ICD-10-CM | POA: Diagnosis not present

## 2021-03-11 DIAGNOSIS — R079 Chest pain, unspecified: Secondary | ICD-10-CM | POA: Diagnosis not present

## 2021-03-11 LAB — BASIC METABOLIC PANEL
Anion gap: 7 (ref 5–15)
BUN: 21 mg/dL (ref 8–23)
CO2: 27 mmol/L (ref 22–32)
Calcium: 10 mg/dL (ref 8.9–10.3)
Chloride: 100 mmol/L (ref 98–111)
Creatinine, Ser: 1.73 mg/dL — ABNORMAL HIGH (ref 0.61–1.24)
GFR, Estimated: 41 mL/min — ABNORMAL LOW (ref >60)
Glucose, Bld: 94 mg/dL (ref 70–99)
Potassium: 4.1 mmol/L (ref 3.5–5.1)
Sodium: 134 mmol/L — ABNORMAL LOW (ref 135–145)

## 2021-03-11 LAB — CBC
HCT: 43.1 % (ref 39.0–52.0)
Hemoglobin: 14.5 g/dL (ref 13.0–17.0)
MCH: 31.2 pg (ref 26.0–34.0)
MCHC: 33.6 g/dL (ref 30.0–36.0)
MCV: 92.7 fL (ref 80.0–100.0)
Platelets: 238 10*3/uL (ref 150–400)
RBC: 4.65 MIL/uL (ref 4.22–5.81)
RDW: 13.3 % (ref 11.5–15.5)
WBC: 9.4 10*3/uL (ref 4.0–10.5)
nRBC: 0 % (ref 0.0–0.2)

## 2021-03-11 LAB — TROPONIN I (HIGH SENSITIVITY): Troponin I (High Sensitivity): 4 ng/L (ref <18)

## 2021-03-11 MED ORDER — ALUM & MAG HYDROXIDE-SIMETH 200-200-20 MG/5ML PO SUSP
30.0000 mL | Freq: Once | ORAL | Status: AC
Start: 2021-03-11 — End: 2021-03-11
  Administered 2021-03-11: 30 mL via ORAL
  Filled 2021-03-11: qty 30

## 2021-03-11 NOTE — ED Provider Notes (Signed)
[facility] Provider Note   CSN: [id number] Arrival date & time: 03/11/21  2239     History  Chief Complaint  Patient presents with   Chest Pain    David [name] is a 75 y.o. male.  The history is provided by the patient and medical records.  Chest Pain David [name] is a 76 y.o. male who presents to the Emergency Department complaining of chest pain.  He presents to the ED for evaluation of central chest pain and pain between his shoulder blades.  Described as 3/10. Improved with belching.  Pain waxes and wanes.  No arm/leg pain.  Had mild HA. Had prior similar sxs in 56 - dx with spastic diaphragm.  Pain felt like a pulled muscle.  Pain resolved at time of ED arrival.    No nausea, diaphoresis, leg swelling/pain.  No recent illnesses.  No abdominal pain.    Has a hx/o CKD, HTN, HPL, gout, afib on Eliquis.       Home Medications Prior to Admission medications   Medication Sig Start Date End Date Taking? Authorizing Provider  ascorbic acid (VITAMIN C) 1000 MG tablet Take by mouth.    [provider]  atenolol (TENORMIN) 50 MG tablet Take 1 tablet (50 mg total) by mouth 2 (two) times daily. 10/25/20   Mosie Lukes, MD  atorvastatin (LIPITOR) 10 MG tablet TAKE ONE TABLET BY MOUTH EVERY MORNING 09/26/20   Mosie Lukes, MD  clotrimazole-betamethasone (LOTRISONE) lotion Apply topically 2 (two) times daily. For 2 weeks. 11/04/20   Mosie Lukes, MD  colchicine 0.6 MG tablet For gout flares, take 1 tab po once then repeat in 1 hour as needed if pain persists up to a total of 3 doses Patient not taking: Reported on 02/20/2021 05/20/20   Mosie Lukes, MD  ELIQUIS 5 MG TABS tablet TAKE ONE TABLET BY MOUTH TWICE A DAY 03/03/21   Minus Breeding, MD  FLUoxetine (PROZAC) 10 MG tablet Take 1 tablet (10 mg total) by mouth 3 (three) times daily. 02/26/21   Mosie Lukes, MD  LORazepam (ATIVAN) 1 MG tablet TAKE ONE TABLET BY MOUTH TWICE A DAY AS  NEEDED FOR ANXIETY OR SEDATION 01/21/21   Mosie Lukes, MD  [facility] powder Apply topically. 10/18/20   [provider]  omeprazole (PRILOSEC) 20 MG capsule Take 20 mg by mouth daily as needed (acid reflux).     [provider]      Allergies    Cefaclor, Cephalosporins, and Penicillins    Review of Systems   Review of Systems  Cardiovascular:  Positive for chest pain.  All other systems reviewed and are negative.  Physical Exam Updated Vital Signs BP (!) 142/83    Pulse (!) 55    Temp 97.8 F (36.6 C) (Oral)    Resp 10    Ht 6' (1.829 m)    Wt 113.4 kg    SpO2 99%    BMI 33.91 kg/m  Physical Exam Vitals and nursing note reviewed.  Constitutional:      Appearance: He is well-developed.  HENT:     Head: Normocephalic and atraumatic.  Cardiovascular:     Rate and Rhythm: Normal rate and regular rhythm.     Heart sounds: No murmur heard. Pulmonary:     Effort: Pulmonary effort is normal. No respiratory distress.     Breath sounds: Normal breath sounds.  Abdominal:     Palpations: Abdomen is  soft.     Tenderness: There is no abdominal tenderness. There is no guarding or rebound.  Musculoskeletal:        General: No swelling or tenderness.     Comments: 2+ DP pulses.  No lower extremity edema.   Skin:    General: Skin is warm and dry.  Neurological:     Mental Status: He is alert and oriented to person, place, and time.  Psychiatric:        Behavior: Behavior normal.    ED Results / Procedures / Treatments   Labs (all labs ordered are listed, but only abnormal results are displayed) Labs Reviewed  BASIC METABOLIC PANEL - Abnormal; Notable for the following components:      Result Value   Sodium 134 (*)    Creatinine, Ser 1.73 (*)    GFR, Estimated 41 (*)    All other components within normal limits  CBC  HEPATIC FUNCTION PANEL  LIPASE, BLOOD  TROPONIN I (HIGH SENSITIVITY)  TROPONIN I (HIGH SENSITIVITY)    EKG EKG  Interpretation  Date/Time:  Tuesday March 11 2021 22:46:51 EST Ventricular Rate:  57 PR Interval:  202 QRS Duration: 96 QT Interval:  412 QTC Calculation: 401 R Axis:   -26 Text Interpretation: Sinus bradycardia Incomplete right bundle branch block Possible Anterior infarct , age undetermined Abnormal ECG  similar when compared to prior Confirmed by Quintella Reichert [phone]) on 03/11/2021 10:58:27 PM  Radiology DG Chest 2 View  Result Date: 03/11/2021 CLINICAL DATA:  Chest pain for 1 hour EXAM: CHEST - 2 VIEW COMPARISON:  10/16/2020 FINDINGS: Frontal and lateral views of the chest demonstrate an unremarkable cardiac silhouette. No acute airspace disease, effusion, or pneumothorax. No acute bony abnormalities. IMPRESSION: 1. No acute intrathoracic process. Electronically Signed   By: Randa Ngo M.D.   On: 03/11/2021 23:06    Procedures Procedures    Medications Ordered in ED Medications  alum & mag hydroxide-simeth (MAALOX/MYLANTA) 200-200-20 MG/5ML suspension 30 mL (30 mLs Oral Given 03/11/21 2350)    ED Course/ Medical Decision Making/ A&P                           Medical Decision Making Amount and/or Complexity of Data Reviewed Labs: ordered. Radiology: ordered.  Risk OTC drugs.   Patient with history of hypertension, hyperlipidemia, CKD and A-fib on anticoagulation here for evaluation of chest pain after eating tacos.  With GI cocktail in the emergency department his symptoms have completely resolved.  EKG is abnormal but similar when compared to priors and troponins are negative x2.  Current clinical picture is not consistent with ACS, PE, dissection, pneumonia.  Discussed with patient home care for chest pain with outpatient follow-up and return precautions.  BMP with stable renal function.        Final Clinical Impression(s) / ED Diagnoses Final diagnoses:  Atypical chest pain    Rx / DC Orders ED Discharge Orders     None         Quintella Reichert, MD 03/12/21 [phone]

## 2021-03-11 NOTE — ED Triage Notes (Signed)
Pt c/o CP x 1 hour-NAD-steady gait

## 2021-03-12 LAB — TROPONIN I (HIGH SENSITIVITY): Troponin I (High Sensitivity): 4 ng/L (ref <18)

## 2021-03-12 LAB — LIPASE, BLOOD: Lipase: 34 U/L (ref 11–51)

## 2021-03-12 LAB — HEPATIC FUNCTION PANEL
ALT: 22 U/L (ref 0–44)
AST: 22 U/L (ref 15–41)
Albumin: 4.2 g/dL (ref 3.5–5.0)
Alkaline Phosphatase: 74 U/L (ref 38–126)
Bilirubin, Direct: 0.2 mg/dL (ref 0.0–0.2)
Indirect Bilirubin: 0.5 mg/dL (ref 0.3–0.9)
Total Bilirubin: 0.7 mg/dL (ref 0.3–1.2)
Total Protein: 7.5 g/dL (ref 6.5–8.1)

## 2021-03-12 NOTE — Discharge Instructions (Signed)
You can take gas-x (simethicone), available over the counter according to label instructions.   ?

## 2021-03-26 DIAGNOSIS — D225 Melanocytic nevi of trunk: Secondary | ICD-10-CM | POA: Diagnosis not present

## 2021-03-26 DIAGNOSIS — L821 Other seborrheic keratosis: Secondary | ICD-10-CM | POA: Diagnosis not present

## 2021-03-26 DIAGNOSIS — Z85828 Personal history of other malignant neoplasm of skin: Secondary | ICD-10-CM | POA: Diagnosis not present

## 2021-03-26 DIAGNOSIS — D1801 Hemangioma of skin and subcutaneous tissue: Secondary | ICD-10-CM | POA: Diagnosis not present

## 2021-03-26 DIAGNOSIS — L918 Other hypertrophic disorders of the skin: Secondary | ICD-10-CM | POA: Diagnosis not present

## 2021-03-26 DIAGNOSIS — L720 Epidermal cyst: Secondary | ICD-10-CM | POA: Diagnosis not present

## 2021-03-26 DIAGNOSIS — L304 Erythema intertrigo: Secondary | ICD-10-CM | POA: Diagnosis not present

## 2021-03-28 ENCOUNTER — Telehealth: Payer: Self-pay

## 2021-03-28 NOTE — Telephone Encounter (Signed)
Attempted to call pt. No answer, went straight to VM. VM full.  ?

## 2021-03-28 NOTE — Telephone Encounter (Signed)
Nurse Assessment ?Nurse: Hassell Done, RN, Joelene Millin Date/Time Eilene Ghazi Time): 03/27/2021 6:16:27 PM ?Confirm and document reason for call. If ?symptomatic, describe symptoms. ?---caller states he tested positive for covid. he has ?fatigue, severe sore throat and a headache. no fever. ?Does the patient have any new or worsening ?symptoms? ---Yes ?Will a triage be completed? ---Yes ?Related visit to physician within the last 2 weeks? ---No ?Does the PT have any chronic conditions? (i.e. ?diabetes, asthma, this includes High risk factors for ?pregnancy, etc.) ?---Yes ?List chronic conditions. ---one kidney, hx of prostate cancer, HTN ?Is this a behavioral health or substance abuse call? ---No ?Guidelines ?Guideline Title Affirmed Question Affirmed Notes Nurse Date/Time (Eastern ?Time) ?COVID-19 - ?Diagnosed or ?Suspected ?[1] HIGH RISK ?for severe COVID ?complications (e.g., ?weak immune ?system, age > 45 ?years, obesity with ?BMI 30 or higher, ?pregnant, chronic ?lung disease or other ?chronic medical ?condition) AND [2] ?Hassell Done, RN, ?Joelene Millin ?03/27/2021 6:20:10 ?PM ?PLEASE NOTE: All timestamps contained within this report are represented as [place] Standard Time. ?CONFIDENTIALTY NOTICE: This fax transmission is intended only for the addressee. It contains information that is legally privileged, confidential or ?otherwise protected from use or disclosure. If you are not the intended recipient, you are strictly prohibited from reviewing, disclosing, copying using ?or disseminating any of this information or taking any action in reliance on or regarding this information. If you have received this fax in error, please ?notify us immediately by telephone so that we can arrange for its return to [place]. Phone: [phone], Toll-Free: [phone], Fax: [phone] ?Page: 2 of 2 ?Call Id: [id number] ?Guidelines ?Guideline Title Affirmed Question Affirmed Notes Nurse Date/Time (Eastern ?Time) ?COVID symptoms ?(e.g., cough,  fever) ?(Exceptions: Already ?seen by PCP and no ?new or worsening ?symptoms.) ?Disp. Time (Eastern ?Time) Disposition Final User ?03/27/2021 6:26:09 PM Call PCP within 24 Hours Yes Hassell Done, RN, Joelene Millin ?Caller Disagree/Comply Comply ?Caller Understands Yes ?PreDisposition Call Doctor ?Care Advice Given Per Guideline ?CALL PCP WITHIN 24 HOURS: * You need to discuss this with your doctor (or NP/PA) within the next 24 hours. * IF OFFICE ?WILL BE OPEN: Call the office when it opens tomorrow morning. GENERAL CARE ADVICE FOR COVID-19 SYMPTOMS: * ?The symptoms are generally treated the same whether you have COVID-19, influenza or some other respiratory virus. * Cough: Use ?cough drops. * Feeling dehydrated: Drink extra liquids. If the air in your home is dry, use a humidifier. * Fever: For fever over 101 ?F (38.3 C), take acetaminophen every 4 to 6 hours (Adults 650 mg) OR ibuprofen every 6 to 8 hours (Adults 400 mg). Before taking ?any medicine, read all the instructions on the package. Do not take aspirin unless your doctor has prescribed it for you. * Muscle ?aches, headache, and other pains: Often this comes and goes with the fever. Take acetaminophen every 4 to 6 hours (Adults 650 mg) ?OR ibuprofen every 6 to 8 hours (Adults 400 mg). Before taking any medicine, read all the instructions on the package. * Sore throat: ?Try throat lozenges, hard candy or warm chicken broth. * HOME REMEDY - HARD CANDY: Hard candy works just as well as ?over-the-counter cough drops. People who have diabetes should use sugar-free candy. * HOME REMEDY - HONEY: This old home ?remedy has been shown to help decrease coughing at night. The adult dosage is 2 teaspoons (10 ml) at bedtime. CALL BACK IF: * ?You become worse CARE ADVICE given per COVID-19 - DIAGNOSED OR SUSPECTED (Adult) guideline ?

## 2021-03-31 ENCOUNTER — Telehealth: Payer: Self-pay | Admitting: Family Medicine

## 2021-03-31 ENCOUNTER — Telehealth: Payer: Medicare Other | Admitting: Physician Assistant

## 2021-03-31 DIAGNOSIS — U071 COVID-19: Secondary | ICD-10-CM

## 2021-03-31 MED ORDER — MOLNUPIRAVIR EUA 200MG CAPSULE: 4.0000 | ORAL_CAPSULE | Freq: Two times a day (BID) | ORAL | 0 refills | Status: AC

## 2021-03-31 MED ORDER — FLUTICASONE PROPIONATE 50 MCG/ACT NA SUSP: 2.0000 | Freq: Every day | NASAL | 0 refills | Status: DC

## 2021-03-31 NOTE — Patient Instructions (Signed)
?David [name], thank you for joining Mar Daring, PA-C for today's virtual visit.  While this provider is not your primary care provider (PCP), if your PCP is located in our provider database this encounter information will be shared with them immediately following your visit. ? ?Consent: ?(Patient) David [name] provided verbal consent for this virtual visit at the beginning of the encounter. ? ?Current Medications: ? ?Current Outpatient Medications:  ?  fluticasone (FLONASE) 50 MCG/ACT nasal spray, Place 2 sprays into both nostrils daily., Disp: 16 g, Rfl: 0 ?  molnupiravir EUA (LAGEVRIO) 200 mg CAPS capsule, Take 4 capsules (800 mg total) by mouth 2 (two) times daily for 5 days., Disp: 40 capsule, Rfl: 0 ?  ascorbic acid (VITAMIN C) 1000 MG tablet, Take by mouth., Disp: , Rfl:  ?  atenolol (TENORMIN) 50 MG tablet, Take 1 tablet (50 mg total) by mouth 2 (two) times daily., Disp: 180 tablet, Rfl: 1 ?  atorvastatin (LIPITOR) 10 MG tablet, TAKE ONE TABLET BY MOUTH EVERY MORNING, Disp: 90 tablet, Rfl: 1 ?  clotrimazole-betamethasone (LOTRISONE) lotion, Apply topically 2 (two) times daily. For 2 weeks., Disp: 30 mL, Rfl: 0 ?  colchicine 0.6 MG tablet, For gout flares, take 1 tab po once then repeat in 1 hour as needed if pain persists up to a total of 3 doses (Patient not taking: Reported on 02/20/2021), Disp: 15 tablet, Rfl: 0 ?  ELIQUIS 5 MG TABS tablet, TAKE ONE TABLET BY MOUTH TWICE A DAY, Disp: 180 tablet, Rfl: 1 ?  FLUoxetine (PROZAC) 10 MG tablet, Take 1 tablet (10 mg total) by mouth 3 (three) times daily., Disp: 270 tablet, Rfl: 1 ?  LORazepam (ATIVAN) 1 MG tablet, TAKE ONE TABLET BY MOUTH TWICE A DAY AS NEEDED FOR ANXIETY OR SEDATION, Disp: 180 tablet, Rfl: 0 ?  NYAMYC powder, Apply topically., Disp: , Rfl:  ?  omeprazole (PRILOSEC) 20 MG capsule, Take 20 mg by mouth daily as needed (acid reflux). , Disp: , Rfl:   ? ?Medications ordered in this encounter:  ?Meds ordered this encounter   ?Medications  ? molnupiravir EUA (LAGEVRIO) 200 mg CAPS capsule  ?  Sig: Take 4 capsules (800 mg total) by mouth 2 (two) times daily for 5 days.  ?  Dispense:  40 capsule  ?  Refill:  0  ?  Order Specific Question:   Supervising Provider  ?  Answer:   Noemi Chapel [[id number]]  ? fluticasone (FLONASE) 50 MCG/ACT nasal spray  ?  Sig: Place 2 sprays into both nostrils daily.  ?  Dispense:  16 g  ?  Refill:  0  ?  Order Specific Question:   Supervising Provider  ?  Answer:   Noemi Chapel [[id number]]  ?  ? ?*If you need refills on other medications prior to your next appointment, please contact your pharmacy* ? ?Follow-Up: ?Call back or seek an in-person evaluation if the symptoms worsen or if the condition fails to improve as anticipated. ? ?Other Instructions ?Molnupiravir Oral Capsules ?What is this medication? ?MOLNUPIRAVIR (mol nue pir a vir) treats COVID-19. It is an antiviral medication. It may decrease the risk of developing severe symptoms of COVID-19. It may also decrease the chance of going to the hospital. This medication is not approved by the FDA. The FDA has authorized emergency use of this medication during the COVID-19 pandemic. ?This medicine may be used for other purposes; ask your health care provider or pharmacist if you have questions. ?COMMON BRAND  NAME(S): LAGEVRIO ?What should I tell my care team before I take this medication? ?They need to know if you have any of these conditions: ?Any allergies ?Any serious illness ?An unusual or allergic reaction to molnupiravir, other medications, foods, dyes, or preservatives ?Pregnant or trying to get pregnant ?Breast-feeding ?How should I use this medication? ?Take this medication by mouth with water. Take it as directed on the prescription label at the same time every day. Do not cut, crush or chew this medication. Swallow the capsules whole. You can take it with or without food. If it upsets your stomach, take it with food. Take all of this medication unless  your care team tells you to stop it early. Keep taking it even if you think you are better. ?Talk to your care team about the use of this medication in children. Special care may be needed. ?Overdosage: If you think you have taken too much of this medicine contact a poison control center or emergency room at once. ?NOTE: This medicine is only for you. Do not share this medicine with others. ?What if I miss a dose? ?If you miss a dose, take it as soon as you can unless it is more than 10 hours late. If it is more than 10 hours late, skip the missed dose. Take the next dose at the normal time. Do not take extra or 2 doses at the same time to make up for the missed dose. ?What may interact with this medication? ?Interactions have not been studied. ?This list may not describe all possible interactions. Give your health care provider a list of all the medicines, herbs, non-prescription drugs, or dietary supplements you use. Also tell them if you smoke, drink alcohol, or use illegal drugs. Some items may interact with your medicine. ?What should I watch for while using this medication? ?Your condition will be monitored carefully while you are receiving this medication. Visit your care team for regular checkups. Tell your care team if your symptoms do not start to get better or if they get worse. ?Do not become pregnant while taking this medication. You may need a pregnancy test before starting this medication. Women must use a reliable form of birth control while taking this medication and for 4 days after stopping the medication. Women should inform their care team if they wish to become pregnant or think they might be pregnant. Men should not father a child while taking this medication and for 3 months after stopping it. There is potential for serious harm to an unborn child. Talk to your care team for more information. Do not breast-feed an infant while taking this medication and for 4 days after stopping the  medication. ?What side effects may I notice from receiving this medication? ?Side effects that you should report to your care team as soon as possible: ?Allergic reactions--skin rash, itching, hives, swelling of the face, lips, tongue, or throat ?Side effects that usually do not require medical attention (report these to your care team if they continue or are bothersome): ?Diarrhea ?Dizziness ?Nausea ?This list may not describe all possible side effects. Call your doctor for medical advice about side effects. You may report side effects to FDA at 1-800-FDA-1088. ?Where should I keep my medication? ?Keep out of the reach of children and pets. ?Store at room temperature between 20 and 25 degrees C (68 and 77 degrees F). Get rid of any unused medication after the expiration date. ?To get rid of medications that are no longer  needed or have expired: ?Take the medication to a medication take-back program. Check with your pharmacy or law enforcement to find a location. ?If you cannot return the medication, check the label or package insert to see if the medication should be thrown out in the garbage or flushed down the toilet. If you are not sure, ask your care team. If it is safe to put it in the trash, take the medication out of the container. Mix the medication with cat litter, dirt, coffee grounds, or other unwanted substance. Seal the mixture in a bag or container. Put it in the trash. ?NOTE: This sheet is a summary. It may not cover all possible information. If you have questions about this medicine, talk to your doctor, pharmacist, or health care provider. ?? 2022 Elsevier/Gold Standard (2020-01-08 00:00:00) ? ? ? ?If you have been instructed to have an in-person evaluation today at a local Urgent Care facility, please use the link below. It will take you to a list of all of our available [facility], including address, phone number and hours of operation. Please do not delay care.  ?[facility] ? ?If you or a family member do not have a primary care provider, use the link below to schedule a visit and establish care. When you choose a  primary care physician or advanced practice provider, you ga

## 2021-03-31 NOTE — Telephone Encounter (Signed)
Patient called at 4:30pm stating that he and his wife tested positive for Covid on 03/16 and again on 03/18. There were no appointments left for the day so patient was walked through making an E-visit through [employer] Made patient aware of the 5 day period for the Covid antiviral, and patient stated he would make appointments for himself as his wife asap.  ?

## 2021-03-31 NOTE — Progress Notes (Signed)
Virtual Visit Consent   David [name], you are scheduled for a virtual visit with a [facility] provider today.     Just as with appointments in the office, your consent must be obtained to participate.  Your consent will be active for this visit and any virtual visit you may have with one of our providers in the next 365 days.     If you have a MyChart account, a copy of this consent can be sent to you electronically.  All virtual visits are billed to your insurance company just like a traditional visit in the office.    As this is a virtual visit, video technology does not allow for your provider to perform a traditional examination.  This may limit your provider's ability to fully assess your condition.  If your provider identifies any concerns that need to be evaluated in person or the need to arrange testing (such as labs, EKG, etc.), we will make arrangements to do so.     Although advances in technology are sophisticated, we cannot ensure that it will always work on either your end or our end.  If the connection with a video visit is poor, the visit may have to be switched to a telephone visit.  With either a video or telephone visit, we are not always able to ensure that we have a secure connection.     I need to obtain your verbal consent now.   Are you willing to proceed with your visit today?    David [name] has provided verbal consent on 03/31/2021 for a virtual visit (video or telephone).   Margaretann Loveless, PA-C   Date: 03/31/2021 5:44 PM   Virtual Visit via Video Note   I, Margaretann Loveless, connected with  David [name]  ([id number], 30-Jul-1945) on 03/31/21 at  5:30 PM EDT by a video-enabled telemedicine application and verified that I am speaking with the correct person using two identifiers.  Location: Patient: Virtual Visit Location Patient: Home Provider: Virtual Visit Location Provider: Home Office   I discussed the limitations of evaluation and  management by telemedicine and the availability of in person appointments. The patient expressed understanding and agreed to proceed.    Interactive audio and video communications were attempted, although failed due to patient's inability to connect to video. Continued visit with audio only interaction with patient agreement.  History of Present Illness: David [name] is a 76 y.o. who identifies as a male who was assigned male at birth, and is being seen today for Covid 39.  HPI: URI  This is a new problem. Episode onset: Tested positive for Covid 19 on 03/27/21; Symptoms started a day or so prior. Associated symptoms include congestion, coughing, diarrhea, headaches, nausea, rhinorrhea, sinus pain and a sore throat. Pertinent negatives include no ear pain or plugged ear sensation. Associated symptoms comments: Fatigue, myalgias.   He is vaccinated   Problems:  Patient Active Problem List   Diagnosis Date Noted   Coronary atherosclerosis 02/11/2021   Myalgia 11/04/2020   Neck pain 11/04/2020   Stage 3a chronic kidney disease (HCC) 07/02/2020   Urinary frequency 02/12/2020   Genetic testing 09/26/2019   Family history of breast cancer    Family history of colon cancer    Educated about COVID-19 virus infection 08/26/2019   Malignant neoplasm of prostate (HCC) 04/28/2019   Pruritus 02/03/2019   LVH (left ventricular hypertrophy) 10/30/2018   Tinea versicolor 09/28/2018   Thoracic back pain  09/26/2018   Hypercalcemia 08/08/2018   Nonfunctioning kidney 08/04/2018   Arm pain 07/18/2018   Right hip pain 06/22/2018   Ureteral obstruction 06/07/2018   Recurrent nephrolithiasis 04/23/2018   Hydronephrosis 03/01/2018   Floppy eyelid syndrome of both eyes 01/24/2018   Foot pain, bilateral 12/28/2017   Kidney stone 09/18/2017   Insomnia 09/17/2017   Abnormal TSH 04/15/2017   Senile nuclear sclerosis 01/18/2017   Cataract 09/29/2016   Muscle cramp 09/29/2016   Nocturia 09/29/2016    Hyperglycemia 03/29/2016   Low back pain 03/26/2016   Cataracts, bilateral 03/26/2016   Arthritis of both knees 03/26/2016   Hearing loss 03/26/2016   Complex renal cyst 06/16/2015   History of atrial fibrillation 06/16/2015   Snoring 06/07/2015   Somnolence 06/07/2015   Preventative health care 03/07/2015   Lipoma of axilla 06/04/2014   Tinea cruris 03/20/2014   Lichen simplex chronicus 03/20/2014   Medicare annual wellness visit, subsequent 03/12/2014   Depression with anxiety 09/25/2013   Constipation 09/25/2013   Gouty arthritis of toe of left foot 08/01/2013   Skin lesion 05/27/2013   Rash 05/26/2013   Abnormal liver function 03/18/2013   IBS (irritable bowel syndrome) 03/18/2013   Other malaise and fatigue 03/13/2013   Tinea corporis 03/13/2013   Gout 12/14/2012   Diarrhea 12/14/2012   Rectal irritation 09/18/2012   Diverticulosis    BCC (basal cell carcinoma of skin)    Scoliosis 01/19/2011   Hyperlipidemia, mixed 03/11/2010   Essential hypertension, benign 03/11/2010   ALLERGIC RHINITIS 03/11/2010   Arthralgia 03/11/2010   Obesity 11/25/2007   BENIGN NEOPLASM OF ADRENAL GLAND 09/22/2007   Fatty liver disease, nonalcoholic 09/22/2007   Osteoarthritis 09/22/2007   SPONDYLOSIS, LUMBAR 09/22/2007   GERD 04/12/2007    Allergies:  Allergies  Allergen Reactions   Cefaclor Hives   Cephalosporins Hives   Penicillins Rash    Mild maculopapular rash Has patient had a PCN reaction causing immediate rash, facial/tongue/throat swelling, SOB or lightheadedness with hypotension: No Has patient had a PCN reaction causing severe rash involving mucus membranes or skin necrosis: No Has patient had a PCN reaction that required hospitalization: No Has patient had a PCN reaction occurring within the last 10 years: No If all of the above answers are "NO", then may proceed with Cephalosporin use.    Medications:  Current Outpatient Medications:    fluticasone (FLONASE) 50  MCG/ACT nasal spray, Place 2 sprays into both nostrils daily., Disp: 16 g, Rfl: 0   molnupiravir EUA (LAGEVRIO) 200 mg CAPS capsule, Take 4 capsules (800 mg total) by mouth 2 (two) times daily for 5 days., Disp: 40 capsule, Rfl: 0   ascorbic acid (VITAMIN C) 1000 MG tablet, Take by mouth., Disp: , Rfl:    atenolol (TENORMIN) 50 MG tablet, Take 1 tablet (50 mg total) by mouth 2 (two) times daily., Disp: 180 tablet, Rfl: 1   atorvastatin (LIPITOR) 10 MG tablet, TAKE ONE TABLET BY MOUTH EVERY MORNING, Disp: 90 tablet, Rfl: 1   clotrimazole-betamethasone (LOTRISONE) lotion, Apply topically 2 (two) times daily. For 2 weeks., Disp: 30 mL, Rfl: 0   colchicine 0.6 MG tablet, For gout flares, take 1 tab po once then repeat in 1 hour as needed if pain persists up to a total of 3 doses (Patient not taking: Reported on 02/20/2021), Disp: 15 tablet, Rfl: 0   ELIQUIS 5 MG TABS tablet, TAKE ONE TABLET BY MOUTH TWICE A DAY, Disp: 180 tablet, Rfl: 1   FLUoxetine (PROZAC) 10  MG tablet, Take 1 tablet (10 mg total) by mouth 3 (three) times daily., Disp: 270 tablet, Rfl: 1   LORazepam (ATIVAN) 1 MG tablet, TAKE ONE TABLET BY MOUTH TWICE A DAY AS NEEDED FOR ANXIETY OR SEDATION, Disp: 180 tablet, Rfl: 0   NYAMYC powder, Apply topically., Disp: , Rfl:    omeprazole (PRILOSEC) 20 MG capsule, Take 20 mg by mouth daily as needed (acid reflux). , Disp: , Rfl:   Observations/Objective: Patient is well-developed, well-nourished in no acute distress.  Resting comfortably at home.  Head is normocephalic, atraumatic.  No labored breathing.  Speech is clear and coherent with logical content.  Patient is alert and oriented at baseline.    Assessment and Plan: 1. COVID-19 - molnupiravir EUA (LAGEVRIO) 200 mg CAPS capsule; Take 4 capsules (800 mg total) by mouth 2 (two) times daily for 5 days.  Dispense: 40 capsule; Refill: 0 - fluticasone (FLONASE) 50 MCG/ACT nasal spray; Place 2 sprays into both nostrils daily.  Dispense: 16 g;  Refill: 0  - Continue OTC symptomatic management of choice - Will send OTC vitamins and supplement information through [facility] - Molnupiravir and flonase prescribed; Discussed out of 5 day window, but since higher risk would start to lessen severity and hopefully improve symptoms, but may be not as effective as if started earlier; he voiced understanding.  - Patient enrolled in MyChart symptom monitoring - Push fluids - Rest as needed - Discussed return precautions and when to seek in-person evaluation, sent via [facility] as well  Follow Up Instructions: I discussed the assessment and treatment plan with the patient. The patient was provided an opportunity to ask questions and all were answered. The patient agreed with the plan and demonstrated an understanding of the instructions.  A copy of instructions were sent to the patient via MyChart unless otherwise noted below.    The patient was advised to call back or seek an in-person evaluation if the symptoms worsen or if the condition fails to improve as anticipated.  Time:  I spent 17 minutes with the patient via telehealth technology discussing the above problems/concerns.    Margaretann Loveless, PA-C

## 2021-05-04 ENCOUNTER — Other Ambulatory Visit: Payer: Self-pay | Admitting: Family Medicine

## 2021-05-12 ENCOUNTER — Other Ambulatory Visit: Payer: Self-pay | Admitting: Family Medicine

## 2021-05-12 NOTE — Telephone Encounter (Signed)
Requesting: lorazepam '1mg'$   ?Contract:03/30/2017 ?UDS: 12/28/2017 ?Last Visit: 12/19/20 ?Next Visit: 05/27/21 ?Last Refill: 01/21/2021 #180 and 0RF ? ?Please Advise ? ?

## 2021-05-26 NOTE — Progress Notes (Deleted)
Subjective:    Patient ID: David [name], male    DOB: [birth year], 76 y.o.   MRN: [id number]  No chief complaint on file.   HPI Patient is in today for a follow up.   Past Medical History:  Diagnosis Date   Adenoma of right adrenal gland 07/11/2002   2.4cm , noted on CT ABD   Allergy    Anxiety    Arthritis of both knees 03/26/2016   Astigmatism    Back pain    BCC (basal cell carcinoma of skin)    under right eye and right ear   Blood transfusion without reported diagnosis    Cataract 09/29/2016   Depression    Diverticulitis 2009   Diverticulosis    Family history of breast cancer    Family history of colon cancer    Fatty liver    GERD (gastroesophageal reflux disease)    Gout    Hearing loss of both ears 03/26/2016   History of atrial fibrillation    x 2 none since 2020   History of chronic prostatitis    started at age 64   History of kidney stones    Hyperglycemia    Hyperlipidemia    Hypertension    Incomplete right bundle branch block (RBBB)    Internal hemorrhoids    Kidney lesion 06/07/2015   Right midportion, 1.1x1.1 cm hyperechoic, noted on [place] ABD   LAFB (left anterior fascicular block)    Lipoma of axilla 09/2016   Liver lesion, right lobe 06/07/2015   2.5x2.4x2.4 cm hypoechoic lesion posterior aspect, noted on [place] ABD   Low back pain 03/26/2016   LVH (left ventricular hypertrophy) 08/06/2017   Moderate, Noted on ECHO   Medicare annual wellness visit, subsequent 03/12/2014   OA (osteoarthritis)    Back, Hands, Neck   Obesity 11/25/2007   Qualifier: Diagnosis of  By: Lenna Gilford MD, Deborra Medina    Other malaise and fatigue 03/13/2013   Premature ventricular complex    Prostate cancer ([place]) dx 2021   Renal insufficiency    Kidney removed right   Scoliosis    Upper thoracic and lumbar   Sinus bradycardia 08/06/2017   Noted on EKG   Vitamin D deficiency    resolved    Past Surgical History:  Procedure Laterality Date   CARDIOVERSION   07/31/2017   CATARACT EXTRACTION, BILATERAL     COLON SURGERY  2009   segmental sigmoid resection   COLONOSCOPY     CYSTOSCOPY N/A 07/28/2019   Procedure: CYSTOSCOPY FLEXIBLE;  Surgeon: Cleon Gustin, MD;  Location: [facility];  Service: Urology;  Laterality: N/A;   CYSTOSCOPY W/ RETROGRADES Right 06/07/2018   Procedure: CYSTOSCOPY WITH RETROGRADE PYELOGRAM, uphrostogram;  Surgeon: Cleon Gustin, MD;  Location: [facility];  Service: Urology;  Laterality: Right;   CYSTOSCOPY WITH URETEROSCOPY AND STENT PLACEMENT Right 02/28/2018   Procedure: CYSTOSCOPY WITH URETEROSCOPY Con[place] Se;  Surgeon: Kathie Rhodes, MD;  Location: [facility];  Service: Urology;  Laterality: Right;   CYSTOSCOPY/URETEROSCOPY/HOLMIUM LASER/STENT PLACEMENT Right 11/29/2017   Procedure: CYSTOSCOPY/RETROGRADE/URETEROSCOPY/HOLMIUM LASER/STENT PLACEMENT;  Surgeon: Kathie Rhodes, MD;  Location: [facility];  Service: Urology;  Laterality: Right;   EYE SURGERY Bilateral 01/12/2017   cataract removal   history of blood tranfusion  age 66   HOLMIUM LASER APPLICATION Right 08/22/9145   Procedure: HOLMIUM LASER APPLICATION;  Surgeon: Kathie Rhodes, MD;  Location: [facility];  Service: Urology;  Laterality: Right;  IR BALLOON DILATION URETERAL STRICTURE RIGHT  03/14/2018   IR NEPHRO TUBE REMOV/FL  03/17/2018   IR NEPHROSTOGRAM RIGHT THRU EXISTING ACCESS  03/17/2018   IR NEPHROSTOMY EXCHANGE RIGHT  03/14/2018   IR NEPHROSTOMY EXCHANGE RIGHT  06/21/2018   IR NEPHROSTOMY PLACEMENT RIGHT  03/02/2018   IR NEPHROSTOMY PLACEMENT RIGHT  04/20/2018   IR URETERAL STENT PLACEMENT EXISTING ACCESS RIGHT  03/14/2018   NOSE SURGERY     Submucous resection age 79   NUCLEAR STRESS TEST  06/03/2009   RADIOACTIVE SEED IMPLANT N/A 07/28/2019   Procedure: RADIOACTIVE SEED IMPLANT/BRACHYTHERAPY IMPLANT;  Surgeon: Cleon Gustin, MD;  Location: [facility];  Service: Urology;  Laterality: N/A;  90  MINS   ROBOT ASSISTED LAPAROSCOPIC NEPHRECTOMY Right 08/04/2018   Procedure: XI ROBOTIC ASSISTED LAPAROSCOPIC NEPHRECTOMY;  Surgeon: Cleon Gustin, MD;  Location: [facility];  Service: Urology;  Laterality: Right;  3 HRS   ROBOT ASSISTED PYELOPLASTY Right 06/07/2018   Procedure: attempted XI ROBOTIC ASSISTED PYELOPLASTY, lysis of adhesions;  Surgeon: Cleon Gustin, MD;  Location: [facility];  Service: Urology;  Laterality: Right;  3 HRS   SKIN BIOPSY     SPACE OAR INSTILLATION N/A 07/28/2019   Procedure: SPACE OAR INSTILLATION;  Surgeon: Cleon Gustin, MD;  Location: [facility];  Service: Urology;  Laterality: N/A;   URETEROSCOPY WITH HOLMIUM LASER LITHOTRIPSY Right 09/24/2017   Procedure: CYSTOSCOPY, URETEROSCOPY WITH HOLMIUM LASER LITHOTRIPSY, STENT PLACEMENT;  Surgeon: Kathie Rhodes, MD;  Location: [facility];  Service: Urology;  Laterality: Right;    Family History  Problem Relation Age of Onset   Heart disease Mother    Hypertension Mother    Stroke Mother    Colon cancer Mother 48   Breast cancer Mother 95   Heart disease Father        pacemaker   Aortic aneurysm Father    Hypertension Father    Lung cancer Father 63   Heart disease Sister    Atrial fibrillation Sister    Obesity Sister    Sleep apnea Sister    Heart attack Brother    Other Brother        muscle disease   Arthritis Brother    Stroke Brother    Atrial fibrillation Brother    Heart attack Brother    Atrial fibrillation Brother    Diabetes Brother    Atrial fibrillation Brother    Heart attack Brother    Hypertension Brother    Hyperlipidemia Brother    Heart attack Brother    Other Brother        heart valve operation   Atrial fibrillation Brother    Heart attack Maternal Grandmother    Diabetes Maternal Grandmother    Cancer Maternal Grandfather 49       hodgin's lymphoma   Heart attack Paternal Grandmother    Anxiety disorder Paternal Grandmother     Pneumonia Paternal Grandfather    Liver cancer Nephew 2       great nephew; d. 48   Lymphoma Niece 35   Esophageal cancer Neg Hx    Rectal cancer Neg Hx    Stomach cancer Neg Hx     Social History   Socioeconomic History   Marital status: Married    Spouse name: Not on file   Number of children: 3   Years of education: Not on file   Highest education level: Not on file  Occupational History  Occupation: Nurse, adult: [employer]  Tobacco Use   Smoking status: Former    Packs/day: 1.00    Years: 6.00    Pack years: 6.00    Types: Cigarettes    Start date: 01/11/1969    Quit date: 01/12/1969    Years since quitting: 52.4   Smokeless tobacco: Never   Tobacco comments:    1965-1971  Vaping Use   Vaping Use: Never used  Substance and Sexual Activity   Alcohol use: Yes    Comment: occ   Drug use: Never   Sexual activity: Yes  Other Topics Concern   Not on file  Social History Narrative   The patient is married for the second time, he has 3 sons.   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   2 alcoholic beverages most days.   1 caffeinated beverage daily   No drug use no current tobacco use he is a prior smoker   12/24/2016   Social Determinants of Health   Financial Resource Strain: Low Risk    Difficulty of Paying Living Expenses: Not hard at all  Food Insecurity: No Food Insecurity   Worried About Charity fundraiser in the Last Year: Never true   Arboriculturist in the Last Year: Never true  Transportation Needs: No Transportation Needs   Lack of Transportation (Medical): No   Lack of Transportation (Non-Medical): No  Physical Activity: Insufficiently Active   Days of Exercise per Week: 5 days   Minutes of Exercise per Session: 20 min  Stress: Stress Concern Present   Feeling of Stress : To some extent  Social Connections: Moderately Isolated   Frequency of Communication with Friends and Family: More than three times a week   Frequency of Social Gatherings  with Friends and Family: Twice a week   Attends Religious Services: Never   Marine scientist or Organizations: No   Attends Music therapist: Never   Marital Status: Married  Human resources officer Violence: Not At Risk   Fear of Current or Ex-Partner: No   Emotionally Abused: No   Physically Abused: No   Sexually Abused: No    Outpatient Medications Prior to Visit  Medication Sig Dispense Refill   LORazepam (ATIVAN) 1 MG tablet TAKE ONE TABLET BY MOUTH TWICE A DAY AS NEEDED FOR ANXIETY OR SEDATION 180 tablet 1   ascorbic acid (VITAMIN C) 1000 MG tablet Take by mouth.     atenolol (TENORMIN) 50 MG tablet TAKE ONE TABLET BY MOUTH TWICE A DAY 180 tablet 1   atorvastatin (LIPITOR) 10 MG tablet TAKE ONE TABLET BY MOUTH EVERY MORNING 90 tablet 1   clotrimazole-betamethasone (LOTRISONE) lotion Apply topically 2 (two) times daily. For 2 weeks. 30 mL 0   colchicine 0.6 MG tablet For gout flares, take 1 tab po once then repeat in 1 hour as needed if pain persists up to a total of 3 doses (Patient not taking: Reported on 02/20/2021) 15 tablet 0   ELIQUIS 5 MG TABS tablet TAKE ONE TABLET BY MOUTH TWICE A DAY 180 tablet 1   FLUoxetine (PROZAC) 10 MG tablet Take 1 tablet (10 mg total) by mouth 3 (three) times daily. 270 tablet 1   fluticasone (FLONASE) 50 MCG/ACT nasal spray Place 2 sprays into both nostrils daily. 16 g 0   NYAMYC powder Apply topically.     omeprazole (PRILOSEC) 20 MG capsule Take 20 mg by mouth daily as needed (acid reflux).  No facility-administered medications prior to visit.    Allergies  Allergen Reactions   Cefaclor Hives   Cephalosporins Hives   Penicillins Rash    Mild maculopapular rash Has patient had a PCN reaction causing immediate rash, facial/tongue/throat swelling, SOB or lightheadedness with hypotension: No Has patient had a PCN reaction causing severe rash involving mucus membranes or skin necrosis: No Has patient had a PCN reaction that  required hospitalization: No Has patient had a PCN reaction occurring within the last 10 years: No If all of the above answers are "NO", then may proceed with Cephalosporin use.     ROS     Objective:    Physical Exam  There were no vitals taken for this visit. Wt Readings from Last 3 Encounters:  03/11/21 250 lb (113.4 kg)  02/20/21 252 lb (114.3 kg)  02/09/21 252 lb (114.3 kg)    Diabetic Foot Exam - Simple   No data filed    Lab Results  Component Value Date   WBC 9.4 03/11/2021   HGB 14.5 03/11/2021   HCT 43.1 03/11/2021   PLT 238 03/11/2021   GLUCOSE 94 03/11/2021   CHOL 175 12/20/2020   TRIG 182 (H) 12/20/2020   HDL 38 (L) 12/20/2020   LDLDIRECT 168.5 03/29/2006   LDLCALC 107 (H) 12/20/2020   ALT 22 03/11/2021   AST 22 03/11/2021   NA 134 (L) 03/11/2021   K 4.1 03/11/2021   CL 100 03/11/2021   CREATININE 1.73 (H) 03/11/2021   BUN 21 03/11/2021   CO2 27 03/11/2021   TSH 3.11 12/20/2020   PSA 3.84 10/11/2020   INR 1.0 07/25/2019   HGBA1C 5.6 12/20/2020    Lab Results  Component Value Date   TSH 3.11 12/20/2020   Lab Results  Component Value Date   WBC 9.4 03/11/2021   HGB 14.5 03/11/2021   HCT 43.1 03/11/2021   MCV 92.7 03/11/2021   PLT 238 03/11/2021   Lab Results  Component Value Date   NA 134 (L) 03/11/2021   K 4.1 03/11/2021   CO2 27 03/11/2021   GLUCOSE 94 03/11/2021   BUN 21 03/11/2021   CREATININE 1.73 (H) 03/11/2021   BILITOT 0.7 03/11/2021   ALKPHOS 74 03/11/2021   AST 22 03/11/2021   ALT 22 03/11/2021   PROT 7.5 03/11/2021   ALBUMIN 4.2 03/11/2021   CALCIUM 10.0 03/11/2021   ANIONGAP 7 03/11/2021   GFR 37.18 (L) 08/05/2020   Lab Results  Component Value Date   CHOL 175 12/20/2020   Lab Results  Component Value Date   HDL 38 (L) 12/20/2020   Lab Results  Component Value Date   LDLCALC 107 (H) 12/20/2020   Lab Results  Component Value Date   TRIG 182 (H) 12/20/2020   Lab Results  Component Value Date    CHOLHDL 4.6 12/20/2020   Lab Results  Component Value Date   HGBA1C 5.6 12/20/2020       Assessment & Plan:   Problem List Items Addressed This Visit   None   I am having David [name] "Fred" maintain his omeprazole, ascorbic acid, colchicine, atorvastatin, Nyamyc, clotrimazole-betamethasone, FLUoxetine, Eliquis, fluticasone, atenolol, and LORazepam.  No orders of the defined types were placed in this encounter.

## 2021-05-27 ENCOUNTER — Ambulatory Visit (HOSPITAL_BASED_OUTPATIENT_CLINIC_OR_DEPARTMENT_OTHER)
Admission: RE | Admit: 2021-05-27 | Discharge: 2021-05-27 | Disposition: A | Discharge status: Home / Self Care | Payer: Medicare Other | Source: Ambulatory Visit | Attending: Family Medicine | Admitting: Family Medicine

## 2021-05-27 ENCOUNTER — Encounter: Payer: Self-pay | Admitting: Family Medicine

## 2021-05-27 ENCOUNTER — Ambulatory Visit (INDEPENDENT_AMBULATORY_CARE_PROVIDER_SITE_OTHER): Payer: Medicare Other | Admitting: Family Medicine

## 2021-05-27 ENCOUNTER — Ambulatory Visit: Payer: Medicare Other | Admitting: Family Medicine

## 2021-05-27 ENCOUNTER — Telehealth: Payer: Self-pay | Admitting: Family Medicine

## 2021-05-27 VITALS — BP 135/71 | HR 53 | Ht 72.0 in | Wt 254.8 lb

## 2021-05-27 DIAGNOSIS — M25561 Pain in right knee: Secondary | ICD-10-CM | POA: Diagnosis not present

## 2021-05-27 DIAGNOSIS — E782 Mixed hyperlipidemia: Secondary | ICD-10-CM

## 2021-05-27 DIAGNOSIS — M1711 Unilateral primary osteoarthritis, right knee: Secondary | ICD-10-CM | POA: Diagnosis not present

## 2021-05-27 DIAGNOSIS — G8929 Other chronic pain: Secondary | ICD-10-CM | POA: Insufficient documentation

## 2021-05-27 DIAGNOSIS — R351 Nocturia: Secondary | ICD-10-CM

## 2021-05-27 DIAGNOSIS — Z8546 Personal history of malignant neoplasm of prostate: Secondary | ICD-10-CM

## 2021-05-27 DIAGNOSIS — R739 Hyperglycemia, unspecified: Secondary | ICD-10-CM

## 2021-05-27 DIAGNOSIS — M25511 Pain in right shoulder: Secondary | ICD-10-CM | POA: Diagnosis not present

## 2021-05-27 DIAGNOSIS — I10 Essential (primary) hypertension: Secondary | ICD-10-CM | POA: Diagnosis not present

## 2021-05-27 DIAGNOSIS — M1A9XX0 Chronic gout, unspecified, without tophus (tophi): Secondary | ICD-10-CM

## 2021-05-27 DIAGNOSIS — M19012 Primary osteoarthritis, left shoulder: Secondary | ICD-10-CM | POA: Diagnosis not present

## 2021-05-27 DIAGNOSIS — M25512 Pain in left shoulder: Secondary | ICD-10-CM | POA: Diagnosis not present

## 2021-05-27 DIAGNOSIS — M25551 Pain in right hip: Secondary | ICD-10-CM | POA: Diagnosis not present

## 2021-05-27 DIAGNOSIS — M25562 Pain in left knee: Secondary | ICD-10-CM | POA: Diagnosis not present

## 2021-05-27 DIAGNOSIS — M11261 Other chondrocalcinosis, right knee: Secondary | ICD-10-CM | POA: Diagnosis not present

## 2021-05-27 DIAGNOSIS — M19011 Primary osteoarthritis, right shoulder: Secondary | ICD-10-CM | POA: Diagnosis not present

## 2021-05-27 NOTE — Telephone Encounter (Signed)
Mr. Baze wanted to tell you  ?"I hope your baby girl gets better. Sending all love your way"  ? ? ? ? ?Your patients are seriously the sweetest <3 ?

## 2021-05-27 NOTE — Patient Instructions (Signed)
Shoulder, hip, and knee pain: ?X-rays today ?Trying to avoid NSAIDs/steroids to protect your kidney ?Home exercises, rest, ice, heat, Voltaren gel ?PT referral placed ?Refer to sports medicine if not improving in 4-6 weeks of conservative measures ?

## 2021-05-27 NOTE — Progress Notes (Signed)
? ?Acute Office Visit ? ?Subjective:  ? ?  ?Patient ID: David [name], male    DOB: [birth year], 76 y.o.   MRN: [id number] ? ?CC: joint pain - shoulders, knees, hip ? ? ?HPI ?Patient is in today for various joint pain.  ? ? ?Bilateral shoulder pain: ?Patient reports that for the past year or so he has had bilateral shoulder pain 3/10, occasionally right will be more bothersome than left (he is right handed). Describes discomfort primarily as stiffness that does respond to Voltaren gel and stretching. He has full range of motion, but some overhead movements are uncomfortable. He admits that he has not been as physically active as he would like to be. Denies any radiation, numbness, tingling ? ?Right hip/lower back with sciatica ?- since 2002, xrays have shown arthritis in the past  ?- worse with prolonged sitting/standing ?- feels like a lot of stiffness in the morning, but tends to loosen up as the day goes on ? ?Right knee pain: ?- ongoing for years, thinks he has been told there is arthritis there, but would like an xray  ?- gets some relief with Voltaren gel and stretching ?- when the pain flares up it feels primarily deep/central, no instability, just aching ? ? ? ? ? ?ROS ?All review of systems negative except what is listed in the HPI ? ? ?   ?Objective:  ?  ?BP 135/71   Pulse (!) 53   Ht 6' (1.829 m)   Wt 254 lb 12.8 oz (115.6 kg)   BMI 34.56 kg/m?  ? ? ?Physical Exam ?Vitals reviewed.  ?Constitutional:   ?   Appearance: Normal appearance.  ?Musculoskeletal:     ?   General: No swelling or tenderness.  ?   Right lower leg: No edema.  ?   Left lower leg: No edema.  ?   Comments: Bilateral shoulders: full ROM, no pain with Neers, Luan Pulling, Apley scratch ?Right hip/lower back: full ROM, no notable weakness, no pain on palpation ?Right knee: no pain on palpation or ligament instability noted   ?Skin: ?   General: Skin is warm and dry.  ?   Findings: No bruising or erythema.  ?Neurological:  ?   General: No  focal deficit present.  ?   Mental Status: He is alert and oriented to person, place, and time. Mental status is at baseline.  ?Psychiatric:     ?   Mood and Affect: Mood normal.     ?   Behavior: Behavior normal.     ?   Thought Content: Thought content normal.     ?   Judgment: Judgment normal.  ? ? ?No results found for any visits on 05/27/21. ? ? ?   ?Assessment & Plan:  ? ?1. Hyperlipidemia, mixed ?No discussion today, future orders placed prior to next PCP visit.  ?- Lipid panel; Future ? ?2. Essential hypertension, benign ?No discussion today, future orders placed prior to next PCP visit.  ?- CBC w/Diff; Future ?- Comp Met (CMET); Future ?- TSH; Future ? ?3. Chronic gout without tophus, unspecified cause, unspecified site ?No discussion today, future orders placed prior to next PCP visit.  ?- Uric acid; Future ? ?4. Hyperglycemia ?No discussion today, future orders placed prior to next PCP visit.  ?- HgB A1c; Future ? ?5. History of prostate cancer ?No discussion today, future orders placed prior to next PCP visit.  ?- PSA; Future ? ?6. Chronic pain of right knee ?X-rays today ?Trying  to avoid NSAIDs/steroids to protect your kidney ?Home exercises, rest, ice, heat, Voltaren gel ?PT referral placed ?Refer to sports medicine if not improving in 4-6 weeks of conservative measures ?- DG Knee Complete 4 Views Right; Future ?- DG Knee 1-2 Views Left; Future ?- Ambulatory referral to Physical Therapy ? ?7. Chronic pain of both shoulders ?X-rays today ?Trying to avoid NSAIDs/steroids to protect your kidney ?Home exercises, rest, ice, heat, Voltaren gel ?PT referral placed ?Refer to sports medicine if not improving in 4-6 weeks of conservative measures ?- DG Shoulder Left; Future ?- DG Shoulder Right; Future ?- Ambulatory referral to Physical Therapy ? ?8. Right hip pain ?X-rays today ?Trying to avoid NSAIDs/steroids to protect your kidney ?Home exercises, rest, ice, heat, Voltaren gel ?PT referral placed ?Refer to  sports medicine if not improving in 4-6 weeks of conservative measures ?- DG HIP UNILAT W OR W/O PELVIS 2-3 VIEWS RIGHT; Future ?- Ambulatory referral to Physical Therapy ? ?Patient aware of signs/symptoms requiring further/urgent evaluation. Please contact office for follow-up if symptoms do not improve or worsen. Seek emergency care if symptoms become severe. ? ? ? ?Return for as scheduled - call to schedule lab appointment prior to you f/u w Dr. Charlett Blake in July . ? ?Terrilyn Saver, NP ? ? ?

## 2021-05-28 ENCOUNTER — Encounter: Payer: Self-pay | Admitting: Family Medicine

## 2021-06-18 ENCOUNTER — Ambulatory Visit: Payer: Medicare Other | Attending: Family Medicine | Admitting: Physical Therapy

## 2021-06-18 DIAGNOSIS — M533 Sacrococcygeal disorders, not elsewhere classified: Secondary | ICD-10-CM | POA: Diagnosis not present

## 2021-06-18 DIAGNOSIS — M25561 Pain in right knee: Secondary | ICD-10-CM | POA: Diagnosis not present

## 2021-06-18 DIAGNOSIS — M25512 Pain in left shoulder: Secondary | ICD-10-CM | POA: Insufficient documentation

## 2021-06-18 DIAGNOSIS — M6281 Muscle weakness (generalized): Secondary | ICD-10-CM | POA: Insufficient documentation

## 2021-06-18 DIAGNOSIS — M5459 Other low back pain: Secondary | ICD-10-CM | POA: Insufficient documentation

## 2021-06-18 DIAGNOSIS — M25511 Pain in right shoulder: Secondary | ICD-10-CM | POA: Insufficient documentation

## 2021-06-18 DIAGNOSIS — G8929 Other chronic pain: Secondary | ICD-10-CM | POA: Insufficient documentation

## 2021-06-18 DIAGNOSIS — M25551 Pain in right hip: Secondary | ICD-10-CM | POA: Insufficient documentation

## 2021-06-18 NOTE — Therapy (Signed)
OUTPATIENT PHYSICAL THERAPY EVALUATION   Patient Name: David [name] MRN: [id number] DOB:[birth year], 76 y.o., male Today's Date: 06/18/2021   PT End of Session - 06/18/21 1627     Visit Number 1    Number of Visits 12    Date for PT Re-Evaluation 07/30/21    PT Start Time 1615    PT Stop Time 1700    PT Time Calculation (min) 45 min    Activity Tolerance Patient tolerated treatment well    Behavior During Therapy [facility] for tasks assessed/performed             Past Medical History:  Diagnosis Date   Adenoma of right adrenal gland 07/11/2002   2.4cm , noted on CT ABD   Allergy    Anxiety    Arthritis of both knees 03/26/2016   Astigmatism    Back pain    BCC (basal cell carcinoma of skin)    under right eye and right ear   Blood transfusion without reported diagnosis    Cataract 09/29/2016   Depression    Diverticulitis 2009   Diverticulosis    Family history of breast cancer    Family history of colon cancer    Fatty liver    GERD (gastroesophageal reflux disease)    Gout    Hearing loss of both ears 03/26/2016   History of atrial fibrillation    x 2 none since 2020   History of chronic prostatitis    started at age 61   History of kidney stones    Hyperglycemia    Hyperlipidemia    Hypertension    Incomplete right bundle branch block (RBBB)    Internal hemorrhoids    Kidney lesion 06/07/2015   Right midportion, 1.1x1.1 cm hyperechoic, noted on [place] ABD   LAFB (left anterior fascicular block)    Lipoma of axilla 09/2016   Liver lesion, right lobe 06/07/2015   2.5x2.4x2.4 cm hypoechoic lesion posterior aspect, noted on [place] ABD   Low back pain 03/26/2016   LVH (left ventricular hypertrophy) 08/06/2017   Moderate, Noted on ECHO   Medicare annual wellness visit, subsequent 03/12/2014   OA (osteoarthritis)    Back, Hands, Neck   Obesity 11/25/2007   Qualifier: Diagnosis of  By: Lenna Gilford MD, Deborra Medina    Other malaise and fatigue 03/13/2013   Premature  ventricular complex    Prostate cancer ([place]) dx 2021   Renal insufficiency    Kidney removed right   Scoliosis    Upper thoracic and lumbar   Sinus bradycardia 08/06/2017   Noted on EKG   Vitamin D deficiency    resolved   Past Surgical History:  Procedure Laterality Date   CARDIOVERSION  07/31/2017   CATARACT EXTRACTION, BILATERAL     COLON SURGERY  2009   segmental sigmoid resection   COLONOSCOPY     CYSTOSCOPY N/A 07/28/2019   Procedure: CYSTOSCOPY FLEXIBLE;  Surgeon: Cleon Gustin, MD;  Location: [facility];  Service: Urology;  Laterality: N/A;   CYSTOSCOPY W/ RETROGRADES Right 06/07/2018   Procedure: CYSTOSCOPY WITH RETROGRADE PYELOGRAM, uphrostogram;  Surgeon: Cleon Gustin, MD;  Location: [facility];  Service: Urology;  Laterality: Right;   CYSTOSCOPY WITH URETEROSCOPY AND STENT PLACEMENT Right 02/28/2018   Procedure: CYSTOSCOPY WITH URETEROSCOPY [place];  Surgeon: Kathie Rhodes, MD;  Location[facility];  Service: Urology;  Laterality: Right;   CYSTOSCOPY/URETEROSCOPY/HOLMIUM LASER/STENT PLACEMENT Right 11/29/2017   Procedure: CYSTOSCOPY/RETROGRADE/URETEROSCOPY/HOLMIUM LASER/STENT PLACEMENT;  Surgeon: Karsten Ro,  Elta Guadeloupe, MD;  Location: [facility];  Service: Urology;  Laterality: Right;   EYE SURGERY Bilateral 01/12/2017   cataract removal   history of blood tranfusion  age 34   HOLMIUM LASER APPLICATION Right 9/47/6546   Procedure: HOLMIUM LASER APPLICATION;  Surgeon: Kathie Rhodes, MD;  Location: [facility];  Service: Urology;  Laterality: Right;   IR BALLOON DILATION URETERAL STRICTURE RIGHT  03/14/2018   IR NEPHRO TUBE REMOV/FL  03/17/2018   IR NEPHROSTOGRAM RIGHT THRU EXISTING ACCESS  03/17/2018   IR NEPHROSTOMY EXCHANGE RIGHT  03/14/2018   IR NEPHROSTOMY EXCHANGE RIGHT  06/21/2018   IR NEPHROSTOMY PLACEMENT RIGHT  03/02/2018   IR NEPHROSTOMY PLACEMENT RIGHT  04/20/2018   IR URETERAL STENT PLACEMENT EXISTING ACCESS RIGHT  03/14/2018    NOSE SURGERY     Submucous resection age 39   NUCLEAR STRESS TEST  06/03/2009   RADIOACTIVE SEED IMPLANT N/A 07/28/2019   Procedure: RADIOACTIVE SEED IMPLANT/BRACHYTHERAPY IMPLANT;  Surgeon: Cleon Gustin, MD;  Location: [facility];  Service: Urology;  Laterality: N/A;  90 MINS   ROBOT ASSISTED LAPAROSCOPIC NEPHRECTOMY Right 08/04/2018   Procedure: XI ROBOTIC ASSISTED LAPAROSCOPIC NEPHRECTOMY;  Surgeon: Cleon Gustin, MD;  Location: [facility];  Service: Urology;  Laterality: Right;  3 HRS   ROBOT ASSISTED PYELOPLASTY Right 06/07/2018   Procedure: attempted XI ROBOTIC ASSISTED PYELOPLASTY, lysis of adhesions;  Surgeon: Cleon Gustin, MD;  Location: [facility];  Service: Urology;  Laterality: Right;  3 HRS   SKIN BIOPSY     SPACE OAR INSTILLATION N/A 07/28/2019   Procedure: SPACE OAR INSTILLATION;  Surgeon: Cleon Gustin, MD;  [facility]  Service: Urology;  Laterality: N/A;   URETEROSCOPY WITH HOLMIUM LASER LITHOTRIPSY Right 09/24/2017   Procedure: CYSTOSCOPY, URETEROSCOPY WITH HOLMIUM LASER LITHOTRIPSY, STENT PLACEMENT;  Surgeon: Kathie Rhodes, MD;  Location: [facility];  Service: Urology;  Laterality: Right;   Patient Active Problem List   Diagnosis Date Noted   Coronary atherosclerosis 02/11/2021   Myalgia 11/04/2020   Neck pain 11/04/2020   Stage 3a chronic kidney disease ([place]) 07/02/2020   Urinary frequency 02/12/2020   Genetic testing 09/26/2019   Family history of breast cancer    Family history of colon cancer    Educated about COVID-19 virus infection 08/26/2019   Malignant neoplasm of prostate ([place]) 04/28/2019   Pruritus 02/03/2019   LVH (left ventricular hypertrophy) 10/30/2018   Tinea versicolor 09/28/2018   Thoracic back pain 09/26/2018   Hypercalcemia 08/08/2018   Nonfunctioning kidney 08/04/2018   Arm pain 07/18/2018   Right hip pain 06/22/2018   Ureteral obstruction 06/07/2018   Recurrent  nephrolithiasis 04/23/2018   Hydronephrosis 03/01/2018   Floppy eyelid syndrome of both eyes 01/24/2018   Foot pain, bilateral 12/28/2017   Kidney stone 09/18/2017   Insomnia 09/17/2017   Abnormal TSH 04/15/2017   Senile nuclear sclerosis 01/18/2017   Cataract 09/29/2016   Muscle cramp 09/29/2016   Nocturia 09/29/2016   Hyperglycemia 03/29/2016   Low back pain 03/26/2016   Cataracts, bilateral 03/26/2016   Arthritis of both knees 03/26/2016   Hearing loss 03/26/2016   Complex renal cyst 06/16/2015   History of atrial fibrillation 06/16/2015   Snoring 06/07/2015   Somnolence 06/07/2015   Preventative health care 03/07/2015   Lipoma of axilla 06/04/2014   Tinea cruris 50/35/4656   Lichen simplex chronicus 03/20/2014   Medicare annual wellness visit, subsequent 03/12/2014   Depression with anxiety 09/25/2013  Constipation 09/25/2013   Gouty arthritis of toe of left foot 08/01/2013   Skin lesion 05/27/2013   Rash 05/26/2013   Abnormal liver function 03/18/2013   IBS (irritable bowel syndrome) 03/18/2013   Other malaise and fatigue 03/13/2013   Tinea corporis 03/13/2013   Gout 12/14/2012   Diarrhea 12/14/2012   Rectal irritation 09/18/2012   Diverticulosis    BCC (basal cell carcinoma of skin)    Scoliosis 01/19/2011   Hyperlipidemia, mixed 03/11/2010   Essential hypertension, benign 03/11/2010   ALLERGIC RHINITIS 03/11/2010   Arthralgia 03/11/2010   Obesity 11/25/2007   BENIGN NEOPLASM OF ADRENAL GLAND 09/22/2007   Fatty liver disease, nonalcoholic 91/66/0600   Osteoarthritis 09/22/2007   SPONDYLOSIS, LUMBAR 09/22/2007   GERD 04/12/2007    PCP: Mosie Lukes MD  REFERRING PROVIDER: Terrilyn Saver, NP  REFERRING DIAG: [id number] (ICD-10-CM) - Chronic pain of right knee M25.511,G89.29,M25.512 (ICD-10-CM) - Chronic pain of both shoulders M25.551 (ICD-10-CM) - Right hip pain   Rationale for Evaluation and Treatment Rehabilitation  THERAPY DIAG:  Other  low back pain  SI (sacroiliac) pain  Chronic pain of right knee  Muscle weakness (generalized)  ONSET DATE: chronic, worsened over several years  SUBJECTIVE:                                                                                                                                                                                           SUBJECTIVE STATEMENT: Patient reports Patient reports he has a lot of pain but his R hip is the worst "I read its sciatica" followed by his R knee, then his L foot and ankle.  His back also hurts.  His shoulders also bother him.  He reports he used to be very active and used to walk but hasn't had the energy since implant for prostate cancer and surgery to remove R kidney.  He tries to walks in store for about 30 min couple times a week, but hip pain has been limiting.    PERTINENT HISTORY:  Chronic kidney disease, gout, history of R kidney surgery, abdominal surgeries, prostate and skin cancer, obesity, HTN, chronic bil shoulder pain, chronic LBP, neck pain.   PAIN:  Are you having pain? Yes: NPRS scale: 8-9/10 Pain location: R hip 8-9/10, R knee 3-4/10, low back 6-7/10 Pain description: feels like a knife in hip/back, knee feels like its sprained Aggravating factors: walking Relieving factors: voltaren gel, laying down for 30 min    PRECAUTIONS: None  WEIGHT BEARING RESTRICTIONS No  FALLS:  Has patient fallen in last 6 months? No  LIVING ENVIRONMENT: Lives with: lives with their spouse Lives  in: House/apartment Stairs: Yes: Internal: 12 steps; can reach both to office upstairs Has following equipment at home: Single point cane uses occasionally due to gout  OCCUPATION: retired Optometrist  PLOF: Independent  PATIENT GOALS loosen up a little, get back to walking   OBJECTIVE:   DIAGNOSTIC FINDINGS:  05/27/2021  Xrays: Hip Bilateral hip joint degenerative changes. Left Shoulder L Mild degenerative changes of the glenohumeral joint. R  shoulder Mild AC and glenohumeral joint degenerative changes. R knee Chondrocalcinosis. Mild patellofemoral joint degenerative changes. Mild medial compartment joint degenerative changes.  PATIENT SURVEYS:  LEFS 46%  COGNITION:  Overall cognitive status: Within functional limits for tasks assessed     SENSATION: Not tested  MUSCLE LENGTH: Hamstrings: mild tightness Quadriceps: mild tightness bil  POSTURE: rounded shoulders and forward head  PALPATION: Tenderness R SIJ, R piriformis, R glute medius.  Decreased mobility lumbar spine with PA mobs, reports tenderness L3-5.  Tenderness R quadriceps tendon.   NO tenderness R hip trochanter.   LUMBAR ROM:   Active  AROM  eval  Flexion To toes  Extension 75%, inc pain midline LB  Right lateral flexion To knee  Left lateral flexion To knee  Right rotation 75%, Inc pain LB  Left rotation 75%, Inc pain LB   (Blank rows = not tested)  LOWER EXTREMITY ROM:   Hip ROM WNL and symmetric bil, no pain.     LOWER EXTREMITY MMT:    MMT Right eval Left eval  Hip flexion 4 4  Hip extension 4 4  Hip abduction 5 5  Hip adduction 5 5  Knee flexion 4 4  Knee extension 5 5  Ankle dorsiflexion 4 4   (Blank rows = not tested)  LUMBAR SPECIAL TESTS:  Straight leg raise test: Negative and FABER test: Negative  KNEE & HIP SPECIAL TESTS: R knee ligaments intact, negative Ober's Test R  FUNCTIONAL TESTS:  5 times sit to stand: 13.6 seconds without UE assist   TODAY'S TREATMENT  06/18/2021 Therapeutic Exercise: to improve strength and mobility.  Demo, verbal and tactile cues throughout for technique. - Supine Lower Trunk Rotation  10 reps - Supine Bridge  10 reps  - L HS cramp initially but subsided and able to perform - Active Straight Leg Raise with Quad Set  10 reps bil - Prone Hip Extension 5 reps bil - Prone Press Up On Elbows  x 1 min - reported felt good on shoulders.    PATIENT EDUCATION:  Education details: Findings, POC,  initial HEP.   Person educated: Patient Education method: Explanation, Demonstration, Verbal cues, and Handouts Education comprehension: verbalized understanding   HOME EXERCISE PROGRAM: Access Code: [email or web address] URL: [email or web address] Date: 06/18/2021 Prepared by: Glenetta Hew  Exercises   ASSESSMENT:  CLINICAL IMPRESSION: Patient is a 76 y.o. male who was seen today for physical therapy evaluation and treatment for chronic knee, hip and shoulder pain.   Imaging demonstrates only mild degenerative changes in these joints.  Patient reported that he has pain throughout his body, most joints, but R hip, R knee, and back were the most limiting so evaluated today.  Deferred evaluating shoulders today, will address in future if needed.  He demonstrates decreased core and extensor strength, tightness throughout, but R hip/back pain appear to be due to pain in R SIJ/glute med and piriformis, and reported decreased pain with sacral PA mobs.  He also demonstrates decreased core strength secondary to abdominal surgery.  His R knee examination was unremarkable and consistent  with mild DJD. He reported that examination and initial exercises today helped decrease his pain.  He demonstrates only 46% on LEFS indicating 54% disability due to pain.  He would benefit from skilled physical therapy to improve strength and conditioning, decrease pain and improve functional mobility.     OBJECTIVE IMPAIRMENTS decreased activity tolerance, decreased endurance, decreased mobility, difficulty walking, decreased ROM, decreased strength, hypomobility, impaired perceived functional ability, increased muscle spasms, impaired flexibility, postural dysfunction, obesity, and pain.   ACTIVITY LIMITATIONS carrying, lifting, bending, sleeping, and locomotion level  PARTICIPATION LIMITATIONS: cleaning, shopping, community activity, and yard work  PERSONAL FACTORS Age, Behavior pattern, Past/current  experiences, Time since onset of injury/illness/exacerbation, and 3+ comorbidities: Chronic kidney disease, gout, history of R kidney surgery, abdominal surgeries, prostate and skin cancer, obesity, HTN, chronic bil shoulder pain, chronic LBP, neck pain.  are also affecting patient's functional outcome.   REHAB POTENTIAL: Good  CLINICAL DECISION MAKING: Evolving/moderate complexity  EVALUATION COMPLEXITY: Moderate   GOALS: Goals reviewed with patient? Yes  SHORT TERM GOALS: Target date: 07/02/2021   Patient will be independent with initial HEP.  Baseline: given Goal status: INITIAL   LONG TERM GOALS: Target date: 07/30/2021    Patient will be independent with advanced/ongoing HEP to improve outcomes and carryover.  Baseline: needs advancement Goal status: INITIAL  2.  Patient will report 75% improvement in R hip/ low back pain to improve QOL.  Baseline: 8-9/10 R hip, LB 6-7/10 Goal status: INITIAL  3.  Patient will demonstrate 4+/5 bil LE strength throughout to decrease muscle imbalance. Baseline: see objective.  Also weakness in core.  Goal status: INITIAL  4.  Patient will report 58% on LEFS to demonstrate improved functional ability.  Baseline: 46% Goal status: INITIAL   5.  Patient will tolerate 30 min of walking without increased R hip/knee pain in order to participate in regular exercise. Baseline: unable Goal status: INITIAL  8.  Patient will be able to transition to community based exercise program to maintain gains.  Baseline: unable Goal status: INITIAL    PLAN: PT FREQUENCY: 1-2x/week  PT DURATION: 6 weeks  PLANNED INTERVENTIONS: Therapeutic exercises, Therapeutic activity, Neuromuscular re-education, Balance training, Gait training, Patient/Family education, Joint mobilization, Stair training, Dry Needling, Electrical stimulation, Spinal mobilization, Cryotherapy, Moist heat, Traction, Ultrasound, Ionotophoresis '4mg'$ /ml Dexamethasone, and Manual  therapy.  PLAN FOR NEXT SESSION: review and progress HEP for core strengthening, LE strengthening, whole body movements.  Manual therapy/modalities PRN,    Rennie Natter, PT, DPT  06/18/2021, 5:55 PM

## 2021-06-23 ENCOUNTER — Ambulatory Visit: Payer: Medicare Other

## 2021-06-23 DIAGNOSIS — M5459 Other low back pain: Secondary | ICD-10-CM | POA: Diagnosis not present

## 2021-06-23 DIAGNOSIS — M25561 Pain in right knee: Secondary | ICD-10-CM | POA: Diagnosis not present

## 2021-06-23 DIAGNOSIS — M533 Sacrococcygeal disorders, not elsewhere classified: Secondary | ICD-10-CM

## 2021-06-23 DIAGNOSIS — M25512 Pain in left shoulder: Secondary | ICD-10-CM | POA: Diagnosis not present

## 2021-06-23 DIAGNOSIS — M6281 Muscle weakness (generalized): Secondary | ICD-10-CM | POA: Diagnosis not present

## 2021-06-23 DIAGNOSIS — M25551 Pain in right hip: Secondary | ICD-10-CM | POA: Diagnosis not present

## 2021-06-23 DIAGNOSIS — M25511 Pain in right shoulder: Secondary | ICD-10-CM | POA: Diagnosis not present

## 2021-06-23 DIAGNOSIS — G8929 Other chronic pain: Secondary | ICD-10-CM | POA: Diagnosis not present

## 2021-06-23 NOTE — Therapy (Signed)
OUTPATIENT PHYSICAL THERAPY TREATMENT   Patient Name: JOSEL KEO MRN: [id number] DOB:[birth year], 76 y.o., male Today's Date: 06/23/2021   PT End of Session - 06/23/21 1539     Visit Number 2    Number of Visits 12    Date for PT Re-Evaluation 07/30/21    PT Start Time 5102    PT Stop Time 5852    PT Time Calculation (min) 43 min    Activity Tolerance Patient tolerated treatment well    Behavior During Therapy [facility] for tasks assessed/performed              Past Medical History:  Diagnosis Date   Adenoma of right adrenal gland 07/11/2002   2.4cm , noted on CT ABD   Allergy    Anxiety    Arthritis of both knees 03/26/2016   Astigmatism    Back pain    BCC (basal cell carcinoma of skin)    under right eye and right ear   Blood transfusion without reported diagnosis    Cataract 09/29/2016   Depression    Diverticulitis 2009   Diverticulosis    Family history of breast cancer    Family history of colon cancer    Fatty liver    GERD (gastroesophageal reflux disease)    Gout    Hearing loss of both ears 03/26/2016   History of atrial fibrillation    x 2 none since 2020   History of chronic prostatitis    started at age 57   History of kidney stones    Hyperglycemia    Hyperlipidemia    Hypertension    Incomplete right bundle branch block (RBBB)    Internal hemorrhoids    Kidney lesion 06/07/2015   Right midportion, 1.1x1.1 cm hyperechoic, noted on [place] ABD   LAFB (left anterior fascicular block)    Lipoma of axilla 09/2016   Liver lesion, right lobe 06/07/2015   2.5x2.4x2.4 cm hypoechoic lesion posterior aspect, noted on [place] ABD   Low back pain 03/26/2016   LVH (left ventricular hypertrophy) 08/06/2017   Moderate, Noted on ECHO   Medicare annual wellness visit, subsequent 03/12/2014   OA (osteoarthritis)    Back, Hands, Neck   Obesity 11/25/2007   Qualifier: Diagnosis of  By: Lenna Gilford MD, Deborra Medina    Other malaise and fatigue 03/13/2013   Premature  ventricular complex    Prostate cancer ([employer]) dx 2021   Renal insufficiency    Kidney removed right   Scoliosis    Upper thoracic and lumbar   Sinus bradycardia 08/06/2017   Noted on EKG   Vitamin D deficiency    resolved   Past Surgical History:  Procedure Laterality Date   CARDIOVERSION  07/31/2017   CATARACT EXTRACTION, BILATERAL     COLON SURGERY  2009   segmental sigmoid resection   COLONOSCOPY     CYSTOSCOPY N/A 07/28/2019   Procedure: CYSTOSCOPY FLEXIBLE;  Surgeon: Cleon Gustin, MD;  Location: [facility];  Service: Urology;  Laterality: N/A;   CYSTOSCOPY W/ RETROGRADES Right 06/07/2018   Procedure: CYSTOSCOPY WITH RETROGRADE PYELOGRAM, uphrostogram;  Surgeon: Cleon Gustin, MD;  Location: [facility];  Service: Urology;  Laterality: Right;   CYSTOSCOPY WITH URETEROSCOPY AND STENT PLACEMENT Right 02/28/2018   Procedure: CYSTOSCOPY WITH URETEROSCOPY [place];  Surgeon: Kathie Rhodes, MD;  Location: [facility];  Service: Urology;  Laterality: Right;   CYSTOSCOPY/URETEROSCOPY/HOLMIUM LASER/STENT PLACEMENT Right 11/29/2017   Procedure: CYSTOSCOPY/RETROGRADE/URETEROSCOPY/HOLMIUM LASER/STENT PLACEMENT;  Surgeon:  Kathie Rhodes, MD;  Location: [facility];  Service: Urology;  Laterality: Right;   EYE SURGERY Bilateral 01/12/2017   cataract removal   history of blood tranfusion  age 28   HOLMIUM LASER APPLICATION Right 1/60/7371   Procedure: HOLMIUM LASER APPLICATION;  Surgeon: Kathie Rhodes, MD;  Location: [facility];  Service: Urology;  Laterality: Right;   IR BALLOON DILATION URETERAL STRICTURE RIGHT  03/14/2018   IR NEPHRO TUBE REMOV/FL  03/17/2018   IR NEPHROSTOGRAM RIGHT THRU EXISTING ACCESS  03/17/2018   IR NEPHROSTOMY EXCHANGE RIGHT  03/14/2018   IR NEPHROSTOMY EXCHANGE RIGHT  06/21/2018   IR NEPHROSTOMY PLACEMENT RIGHT  03/02/2018   IR NEPHROSTOMY PLACEMENT RIGHT  04/20/2018   IR URETERAL STENT PLACEMENT EXISTING ACCESS RIGHT  03/14/2018    NOSE SURGERY     Submucous resection age 88   NUCLEAR STRESS TEST  06/03/2009   RADIOACTIVE SEED IMPLANT N/A 07/28/2019   Procedure: RADIOACTIVE SEED IMPLANT/BRACHYTHERAPY IMPLANT;  Surgeon: Cleon Gustin, MD;  Location: [facility];  Service: Urology;  Laterality: N/A;  90 MINS   ROBOT ASSISTED LAPAROSCOPIC NEPHRECTOMY Right 08/04/2018   Procedure: XI ROBOTIC ASSISTED LAPAROSCOPIC NEPHRECTOMY;  Surgeon: Cleon Gustin, MD;  Location: [facility];  Service: Urology;  Laterality: Right;  3 HRS   ROBOT ASSISTED PYELOPLASTY Right 06/07/2018   Procedure: attempted XI ROBOTIC ASSISTED PYELOPLASTY, lysis of adhesions;  Surgeon: Cleon Gustin, MD;  Location: [facility];  Service: Urology;  Laterality: Right;  3 HRS   SKIN BIOPSY     SPACE OAR INSTILLATION N/A 07/28/2019   Procedure: SPACE OAR INSTILLATION;  Surgeon: Cleon Gustin, MD;  Location: [facility];  Service: Urology;  Laterality: N/A;   URETEROSCOPY WITH HOLMIUM LASER LITHOTRIPSY Right 09/24/2017   Procedure: CYSTOSCOPY, URETEROSCOPY WITH HOLMIUM LASER LITHOTRIPSY, STENT PLACEMENT;  Surgeon: Kathie Rhodes, MD;  Location: [facility];  Service: Urology;  Laterality: Right;   Patient Active Problem List   Diagnosis Date Noted   Coronary atherosclerosis 02/11/2021   Myalgia 11/04/2020   Neck pain 11/04/2020   Stage 3a chronic kidney disease ([place]) 07/02/2020   Urinary frequency 02/12/2020   Genetic testing 09/26/2019   Family history of breast cancer    Family history of colon cancer    Educated about COVID-19 virus infection 08/26/2019   Malignant neoplasm of prostate ([place]) 04/28/2019   Pruritus 02/03/2019   LVH (left ventricular hypertrophy) 10/30/2018   Tinea versicolor 09/28/2018   Thoracic back pain 09/26/2018   Hypercalcemia 08/08/2018   Nonfunctioning kidney 08/04/2018   Arm pain 07/18/2018   Right hip pain 06/22/2018   Ureteral obstruction 06/07/2018   Recurrent  nephrolithiasis 04/23/2018   Hydronephrosis 03/01/2018   Floppy eyelid syndrome of both eyes 01/24/2018   Foot pain, bilateral 12/28/2017   Kidney stone 09/18/2017   Insomnia 09/17/2017   Abnormal TSH 04/15/2017   Senile nuclear sclerosis 01/18/2017   Cataract 09/29/2016   Muscle cramp 09/29/2016   Nocturia 09/29/2016   Hyperglycemia 03/29/2016   Low back pain 03/26/2016   Cataracts, bilateral 03/26/2016   Arthritis of both knees 03/26/2016   Hearing loss 03/26/2016   Complex renal cyst 06/16/2015   History of atrial fibrillation 06/16/2015   Snoring 06/07/2015   Somnolence 06/07/2015   Preventative health care 03/07/2015   Lipoma of axilla 06/04/2014   Tinea cruris 07/08/9483   Lichen simplex chronicus 03/20/2014   Medicare annual wellness visit, subsequent 03/12/2014   Depression with anxiety 09/25/2013  Constipation 09/25/2013   Gouty arthritis of toe of left foot 08/01/2013   Skin lesion 05/27/2013   Rash 05/26/2013   Abnormal liver function 03/18/2013   IBS (irritable bowel syndrome) 03/18/2013   Other malaise and fatigue 03/13/2013   Tinea corporis 03/13/2013   Gout 12/14/2012   Diarrhea 12/14/2012   Rectal irritation 09/18/2012   Diverticulosis    BCC (basal cell carcinoma of skin)    Scoliosis 01/19/2011   Hyperlipidemia, mixed 03/11/2010   Essential hypertension, benign 03/11/2010   ALLERGIC RHINITIS 03/11/2010   Arthralgia 03/11/2010   Obesity 11/25/2007   BENIGN NEOPLASM OF ADRENAL GLAND 09/22/2007   Fatty liver disease, nonalcoholic 54/65/0354   Osteoarthritis 09/22/2007   SPONDYLOSIS, LUMBAR 09/22/2007   GERD 04/12/2007    PCP: Mosie Lukes MD  REFERRING PROVIDER: Terrilyn Saver, NP  REFERRING DIAG: [phone] (ICD-10-CM) - Chronic pain of right knee M25.511,G89.29,M25.512 (ICD-10-CM) - Chronic pain of both shoulders M25.551 (ICD-10-CM) - Right hip pain   Rationale for Evaluation and Treatment Rehabilitation  THERAPY DIAG:  Other  low back pain  SI (sacroiliac) pain  Chronic pain of right knee  Muscle weakness (generalized)  ONSET DATE: chronic, worsened over several years  SUBJECTIVE:                                                                                                                                                                                           SUBJECTIVE STATEMENT: Pt reports pain worsens when sitting for too long, no pain now but 8/10 when sitting for prolonged period.  PERTINENT HISTORY:  Chronic kidney disease, gout, history of R kidney surgery, abdominal surgeries, prostate and skin cancer, obesity, HTN, chronic bil shoulder pain, chronic LBP, neck pain.   PAIN:  Are you having pain? Yes: NPRS scale: 8/10 Pain location: R hip 8-9/10, R knee 3-4/10, low back 6-7/10 Pain description: feels like a knife in hip/back, knee feels like its sprained Aggravating factors: walking Relieving factors: voltaren gel, laying down for 30 min    PRECAUTIONS: None  WEIGHT BEARING RESTRICTIONS No  FALLS:  Has patient fallen in last 6 months? No  LIVING ENVIRONMENT: Lives with: lives with their spouse Lives in: House/apartment Stairs: Yes: Internal: 12 steps; can reach both to office upstairs Has following equipment at home: Single point cane uses occasionally due to gout  OCCUPATION: retired Optometrist  PLOF: Independent  PATIENT GOALS loosen up a little, get back to walking   OBJECTIVE:   DIAGNOSTIC FINDINGS:  05/27/2021  Xrays: Hip Bilateral hip joint degenerative changes. Left Shoulder L Mild degenerative changes of the glenohumeral joint. R shoulder Mild AC and  glenohumeral joint degenerative changes. R knee Chondrocalcinosis. Mild patellofemoral joint degenerative changes. Mild medial compartment joint degenerative changes.  PATIENT SURVEYS:  LEFS 46%  COGNITION:  Overall cognitive status: Within functional limits for tasks assessed     SENSATION: Not tested  MUSCLE  LENGTH: Hamstrings: mild tightness Quadriceps: mild tightness bil  POSTURE: rounded shoulders and forward head  PALPATION: Tenderness R SIJ, R piriformis, R glute medius.  Decreased mobility lumbar spine with PA mobs, reports tenderness L3-5.  Tenderness R quadriceps tendon.   NO tenderness R hip trochanter.   LUMBAR ROM:   Active  AROM  eval  Flexion To toes  Extension 75%, inc pain midline LB  Right lateral flexion To knee  Left lateral flexion To knee  Right rotation 75%, Inc pain LB  Left rotation 75%, Inc pain LB   (Blank rows = not tested)  LOWER EXTREMITY ROM:   Hip ROM WNL and symmetric bil, no pain.     LOWER EXTREMITY MMT:    MMT Right eval Left eval  Hip flexion 4 4  Hip extension 4 4  Hip abduction 5 5  Hip adduction 5 5  Knee flexion 4 4  Knee extension 5 5  Ankle dorsiflexion 4 4   (Blank rows = not tested)  LUMBAR SPECIAL TESTS:  Straight leg raise test: Negative and FABER test: Negative  KNEE & HIP SPECIAL TESTS: R knee ligaments intact, negative Ober's Test R  FUNCTIONAL TESTS:  5 times sit to stand: 13.6 seconds without UE assist   TODAY'S TREATMENT  06/23/21 Therapeutic Exercise: Nustep L3x11mn Supine KTOS stretch x 30 sec bil Supine figure 4 stretch x 30 sec bil Trunk rotation 5x5" S/L clams with red TB 10x bil Seated bil D2 flexion with red weighted ball 10x bil Seated DL 10 reps Seated lumbar flexion stretch with green pball x 30 sec hold  06/18/2021 Therapeutic Exercise: to improve strength and mobility.  Demo, verbal and tactile cues throughout for technique. - Supine Lower Trunk Rotation  10 reps - Supine Bridge  10 reps  - L HS cramp initially but subsided and able to perform - Active Straight Leg Raise with Quad Set  10 reps bil - Prone Hip Extension 5 reps bil - Prone Press Up On Elbows  x 1 min - reported felt good on shoulders.    PATIENT EDUCATION:  Education details: Findings, POC, initial HEP.   Person educated:  Patient Education method: Explanation, Demonstration, Verbal cues, and Handouts Education comprehension: verbalized understanding   HOME EXERCISE PROGRAM: Access Code: [email or web address]   ASSESSMENT:  CLINICAL IMPRESSION: Pt had c/o gluteal pain mostly when sitting for prolonged periods of time. He noted the most benefit today from the piriformis and glute med stretching, so added these exercises to HEP. Pt noted only pulling in low back with exercises, no pain. Based on his presentation he may benefit from some MT to the glutes to reduce the muscle tension.   OBJECTIVE IMPAIRMENTS decreased activity tolerance, decreased endurance, decreased mobility, difficulty walking, decreased ROM, decreased strength, hypomobility, impaired perceived functional ability, increased muscle spasms, impaired flexibility, postural dysfunction, obesity, and pain.   ACTIVITY LIMITATIONS carrying, lifting, bending, sleeping, and locomotion level  PARTICIPATION LIMITATIONS: cleaning, shopping, community activity, and yard work  PERSONAL FACTORS Age, Behavior pattern, Past/current experiences, Time since onset of injury/illness/exacerbation, and 3+ comorbidities: Chronic kidney disease, gout, history of R kidney surgery, abdominal surgeries, prostate and skin cancer, obesity, HTN, chronic bil shoulder pain, chronic LBP, neck  pain.  are also affecting patient's functional outcome.   REHAB POTENTIAL: Good  CLINICAL DECISION MAKING: Evolving/moderate complexity  EVALUATION COMPLEXITY: Moderate   GOALS: Goals reviewed with patient? Yes  SHORT TERM GOALS: Target date: 07/02/2021   Patient will be independent with initial HEP.  Baseline: given Goal status: INITIAL   LONG TERM GOALS: Target date: 07/30/2021    Patient will be independent with advanced/ongoing HEP to improve outcomes and carryover.  Baseline: needs advancement Goal status: INITIAL  2.  Patient will report 75% improvement in R hip/ low back pain  to improve QOL.  Baseline: 8-9/10 R hip, LB 6-7/10 Goal status: INITIAL  3.  Patient will demonstrate 4+/5 bil LE strength throughout to decrease muscle imbalance. Baseline: see objective.  Also weakness in core.  Goal status: INITIAL  4.  Patient will report 58% on LEFS to demonstrate improved functional ability.  Baseline: 46% Goal status: INITIAL   5.  Patient will tolerate 30 min of walking without increased R hip/knee pain in order to participate in regular exercise. Baseline: unable Goal status: INITIAL  8.  Patient will be able to transition to community based exercise program to maintain gains.  Baseline: unable Goal status: INITIAL    PLAN: PT FREQUENCY: 1-2x/week  PT DURATION: 6 weeks  PLANNED INTERVENTIONS: Therapeutic exercises, Therapeutic activity, Neuromuscular re-education, Balance training, Gait training, Patient/Family education, Joint mobilization, Stair training, Dry Needling, Electrical stimulation, Spinal mobilization, Cryotherapy, Moist heat, Traction, Ultrasound, Ionotophoresis '4mg'$ /ml Dexamethasone, and Manual therapy.  PLAN FOR NEXT SESSION: review and progress HEP for core strengthening, LE strengthening, whole body movements.  Manual therapy/modalities PRN,    Artist Pais, PTA 06/23/2021, 3:39 PM

## 2021-06-24 ENCOUNTER — Ambulatory Visit: Payer: Medicare Other | Admitting: Physical Therapy

## 2021-06-25 ENCOUNTER — Ambulatory Visit: Payer: Medicare Other

## 2021-06-25 DIAGNOSIS — N481 Balanitis: Secondary | ICD-10-CM | POA: Diagnosis not present

## 2021-06-25 DIAGNOSIS — M5459 Other low back pain: Secondary | ICD-10-CM

## 2021-06-25 DIAGNOSIS — M25511 Pain in right shoulder: Secondary | ICD-10-CM | POA: Diagnosis not present

## 2021-06-25 DIAGNOSIS — M25551 Pain in right hip: Secondary | ICD-10-CM | POA: Diagnosis not present

## 2021-06-25 DIAGNOSIS — G8929 Other chronic pain: Secondary | ICD-10-CM

## 2021-06-25 DIAGNOSIS — M533 Sacrococcygeal disorders, not elsewhere classified: Secondary | ICD-10-CM

## 2021-06-25 DIAGNOSIS — M6281 Muscle weakness (generalized): Secondary | ICD-10-CM

## 2021-06-25 DIAGNOSIS — M25512 Pain in left shoulder: Secondary | ICD-10-CM | POA: Diagnosis not present

## 2021-06-25 DIAGNOSIS — M25561 Pain in right knee: Secondary | ICD-10-CM | POA: Diagnosis not present

## 2021-06-25 NOTE — Therapy (Signed)
OUTPATIENT PHYSICAL THERAPY TREATMENT   Patient Name: David [name] MRN: [id number] DOB:07/31/1945, 76 y.o., male Today's Date: 06/25/2021   PT End of Session - 06/25/21 1704     Visit Number 3    Number of Visits 12    Date for PT Re-Evaluation 07/30/21    PT Start Time 4287    PT Stop Time 1700    PT Time Calculation (min) 47 min    Activity Tolerance Patient tolerated treatment well    Behavior During Therapy [facility] for tasks assessed/performed               Past Medical History:  Diagnosis Date   Adenoma of right adrenal gland 07/11/2002   2.4cm , noted on CT ABD   Allergy    Anxiety    Arthritis of both knees 03/26/2016   Astigmatism    Back pain    BCC (basal cell carcinoma of skin)    under right eye and right ear   Blood transfusion without reported diagnosis    Cataract 09/29/2016   Depression    Diverticulitis 2009   Diverticulosis    Family history of breast cancer    Family history of colon cancer    Fatty liver    GERD (gastroesophageal reflux disease)    Gout    Hearing loss of both ears 03/26/2016   History of atrial fibrillation    x 2 none since 2020   History of chronic prostatitis    started at age 79   History of kidney stones    Hyperglycemia    Hyperlipidemia    Hypertension    Incomplete right bundle branch block (RBBB)    Internal hemorrhoids    Kidney lesion 06/07/2015   Right midportion, 1.1x1.1 cm hyperechoic, noted on [place] ABD   LAFB (left anterior fascicular block)    Lipoma of axilla 09/2016   Liver lesion, right lobe 06/07/2015   2.5x2.4x2.4 cm hypoechoic lesion posterior aspect, noted on [place] ABD   Low back pain 03/26/2016   LVH (left ventricular hypertrophy) 08/06/2017   Moderate, Noted on ECHO   Medicare annual wellness visit, subsequent 03/12/2014   OA (osteoarthritis)    Back, Hands, Neck   Obesity 11/25/2007   Qualifier: Diagnosis of  By: Lenna Gilford MD, Deborra Medina    Other malaise and fatigue 03/13/2013   Premature  ventricular complex    Prostate cancer ([place]) dx 2021   Renal insufficiency    Kidney removed right   Scoliosis    Upper thoracic and lumbar   Sinus bradycardia 08/06/2017   Noted on EKG   Vitamin D deficiency    resolved   Past Surgical History:  Procedure Laterality Date   CARDIOVERSION  07/31/2017   CATARACT EXTRACTION, BILATERAL     COLON SURGERY  2009   segmental sigmoid resection   COLONOSCOPY     CYSTOSCOPY N/A 07/28/2019   Procedure: CYSTOSCOPY FLEXIBLE;  Surgeon: Cleon Gustin, MD;  Location: [facility];  Service: Urology;  Laterality: N/A;   CYSTOSCOPY W/ RETROGRADES Right 06/07/2018   Procedure: CYSTOSCOPY WITH RETROGRADE PYELOGRAM, uphrostogram;  Surgeon: Cleon Gustin, MD;  Location: [facility];  Service: Urology;  Laterality: Right;   CYSTOSCOPY WITH URETEROSCOPY AND STENT PLACEMENT Right 02/28/2018   Procedure: CYSTOSCOPY WITH URETEROSCOPY [place];  Surgeon: Kathie Rhodes, MD;  Location: [facility];  Service: Urology;  Laterality: Right;   CYSTOSCOPY/URETEROSCOPY/HOLMIUM LASER/STENT PLACEMENT Right 11/29/2017   Procedure: CYSTOSCOPY/RETROGRADE/URETEROSCOPY/HOLMIUM LASER/STENT PLACEMENT;  Surgeon: Kathie Rhodes, MD;  Location: [facility];  Service: Urology;  Laterality: Right;   EYE SURGERY Bilateral 01/12/2017   cataract removal   history of blood tranfusion  age 7   HOLMIUM LASER APPLICATION Right 08/09/2058   Procedure: HOLMIUM LASER APPLICATION;  Surgeon: Kathie Rhodes, MD;  Location[facility];  Service: Urology;  Laterality: Right;   IR BALLOON DILATION URETERAL STRICTURE RIGHT  03/14/2018   IR NEPHRO TUBE REMOV/FL  03/17/2018   IR NEPHROSTOGRAM RIGHT THRU EXISTING ACCESS  03/17/2018   IR NEPHROSTOMY EXCHANGE RIGHT  03/14/2018   IR NEPHROSTOMY EXCHANGE RIGHT  06/21/2018   IR NEPHROSTOMY PLACEMENT RIGHT  03/02/2018   IR NEPHROSTOMY PLACEMENT RIGHT  04/20/2018   IR URETERAL STENT PLACEMENT EXISTING ACCESS RIGHT  03/14/2018    NOSE SURGERY     Submucous resection age 50   NUCLEAR STRESS TEST  06/03/2009   RADIOACTIVE SEED IMPLANT N/A 07/28/2019   Procedure: RADIOACTIVE SEED IMPLANT/BRACHYTHERAPY IMPLANT;  Surgeon: Cleon Gustin, MD;  Location: [facility];  Service: Urology;  Laterality: N/A;  90 MINS   ROBOT ASSISTED LAPAROSCOPIC NEPHRECTOMY Right 08/04/2018   Procedure: XI ROBOTIC ASSISTED LAPAROSCOPIC NEPHRECTOMY;  Surgeon: Cleon Gustin, MD;  Location: [facility];  Service: Urology;  Laterality: Right;  3 HRS   ROBOT ASSISTED PYELOPLASTY Right 06/07/2018   Procedure: attempted XI ROBOTIC ASSISTED PYELOPLASTY, lysis of adhesions;  Surgeon: Cleon Gustin, MD;  Location: [facility];  Service: Urology;  Laterality: Right;  3 HRS   SKIN BIOPSY     SPACE OAR INSTILLATION N/A 07/28/2019   Procedure: SPACE OAR INSTILLATION;  Surgeon: Cleon Gustin, MD;  Location[facility];  Service: Urology;  Laterality: N/A;   URETEROSCOPY WITH HOLMIUM LASER LITHOTRIPSY Right 09/24/2017   Procedure: CYSTOSCOPY, URETEROSCOPY WITH HOLMIUM LASER LITHOTRIPSY, STENT PLACEMENT;  Surgeon: Kathie Rhodes, MD;  Location: [facility];  Service: Urology;  Laterality: Right;   Patient Active Problem List   Diagnosis Date Noted   Coronary atherosclerosis 02/11/2021   Myalgia 11/04/2020   Neck pain 11/04/2020   Stage 3a chronic kidney disease ([place]) 07/02/2020   Urinary frequency 02/12/2020   Genetic testing 09/26/2019   Family history of breast cancer    Family history of colon cancer    Educated about COVID-19 virus infection 08/26/2019   Malignant neoplasm of prostate ([place]) 04/28/2019   Pruritus 02/03/2019   LVH (left ventricular hypertrophy) 10/30/2018   Tinea versicolor 09/28/2018   Thoracic back pain 09/26/2018   Hypercalcemia 08/08/2018   Nonfunctioning kidney 08/04/2018   Arm pain 07/18/2018   Right hip pain 06/22/2018   Ureteral obstruction 06/07/2018   Recurrent  nephrolithiasis 04/23/2018   Hydronephrosis 03/01/2018   Floppy eyelid syndrome of both eyes 01/24/2018   Foot pain, bilateral 12/28/2017   Kidney stone 09/18/2017   Insomnia 09/17/2017   Abnormal TSH 04/15/2017   Senile nuclear sclerosis 01/18/2017   Cataract 09/29/2016   Muscle cramp 09/29/2016   Nocturia 09/29/2016   Hyperglycemia 03/29/2016   Low back pain 03/26/2016   Cataracts, bilateral 03/26/2016   Arthritis of both knees 03/26/2016   Hearing loss 03/26/2016   Complex renal cyst 06/16/2015   History of atrial fibrillation 06/16/2015   Snoring 06/07/2015   Somnolence 06/07/2015   Preventative health care 03/07/2015   Lipoma of axilla 06/04/2014   Tinea cruris 15/61/5379   Lichen simplex chronicus 03/20/2014   Medicare annual wellness visit, subsequent 03/12/2014   Depression with anxiety  09/25/2013   Constipation 09/25/2013   Gouty arthritis of toe of left foot 08/01/2013   Skin lesion 05/27/2013   Rash 05/26/2013   Abnormal liver function 03/18/2013   IBS (irritable bowel syndrome) 03/18/2013   Other malaise and fatigue 03/13/2013   Tinea corporis 03/13/2013   Gout 12/14/2012   Diarrhea 12/14/2012   Rectal irritation 09/18/2012   Diverticulosis    BCC (basal cell carcinoma of skin)    Scoliosis 01/19/2011   Hyperlipidemia, mixed 03/11/2010   Essential hypertension, benign 03/11/2010   ALLERGIC RHINITIS 03/11/2010   Arthralgia 03/11/2010   Obesity 11/25/2007   BENIGN NEOPLASM OF ADRENAL GLAND 09/22/2007   Fatty liver disease, nonalcoholic 84/69/6295   Osteoarthritis 09/22/2007   SPONDYLOSIS, LUMBAR 09/22/2007   GERD 04/12/2007    PCP: Mosie Lukes MD  REFERRING PROVIDER: Terrilyn Saver, NP  REFERRING DIAG: [phone] (ICD-10-CM) - Chronic pain of right knee M25.511,G89.29,M25.512 (ICD-10-CM) - Chronic pain of both shoulders M25.551 (ICD-10-CM) - Right hip pain   Rationale for Evaluation and Treatment Rehabilitation  THERAPY DIAG:  Other  low back pain  SI (sacroiliac) pain  Chronic pain of right knee  Muscle weakness (generalized)  ONSET DATE: chronic, worsened over several years  SUBJECTIVE:                                                                                                                                                                                           SUBJECTIVE STATEMENT: Pt reports doing good today.  PERTINENT HISTORY:  Chronic kidney disease, gout, history of R kidney surgery, abdominal surgeries, prostate and skin cancer, obesity, HTN, chronic bil shoulder pain, chronic LBP, neck pain.   PAIN:  Are you having pain? Yes: NPRS scale: 2/10 Pain location: L patella Pain description: discomfort Aggravating factors: walking Relieving factors: voltaren gel, laying down for 30 min    PRECAUTIONS: None  WEIGHT BEARING RESTRICTIONS No  FALLS:  Has patient fallen in last 6 months? No  LIVING ENVIRONMENT: Lives with: lives with their spouse Lives in: House/apartment Stairs: Yes: Internal: 12 steps; can reach both to office upstairs Has following equipment at home: Single point cane uses occasionally due to gout  OCCUPATION: retired Optometrist  PLOF: Independent  PATIENT GOALS loosen up a little, get back to walking   OBJECTIVE:   DIAGNOSTIC FINDINGS:  05/27/2021  Xrays: Hip Bilateral hip joint degenerative changes. Left Shoulder L Mild degenerative changes of the glenohumeral joint. R shoulder Mild AC and glenohumeral joint degenerative changes. R knee Chondrocalcinosis. Mild patellofemoral joint degenerative changes. Mild medial compartment joint degenerative changes.  PATIENT SURVEYS:  LEFS 46%  COGNITION:  Overall  cognitive status: Within functional limits for tasks assessed     SENSATION: Not tested  MUSCLE LENGTH: Hamstrings: mild tightness Quadriceps: mild tightness bil  POSTURE: rounded shoulders and forward head  PALPATION: Tenderness R SIJ, R piriformis, R  glute medius.  Decreased mobility lumbar spine with PA mobs, reports tenderness L3-5.  Tenderness R quadriceps tendon.   NO tenderness R hip trochanter.   LUMBAR ROM:   Active  AROM  eval  Flexion To toes  Extension 75%, inc pain midline LB  Right lateral flexion To knee  Left lateral flexion To knee  Right rotation 75%, Inc pain LB  Left rotation 75%, Inc pain LB   (Blank rows = not tested)  LOWER EXTREMITY ROM:   Hip ROM WNL and symmetric bil, no pain.     LOWER EXTREMITY MMT:    MMT Right eval Left eval  Hip flexion 4 4  Hip extension 4 4  Hip abduction 5 5  Hip adduction 5 5  Knee flexion 4 4  Knee extension 5 5  Ankle dorsiflexion 4 4   (Blank rows = not tested)  LUMBAR SPECIAL TESTS:  Straight leg raise test: Negative and FABER test: Negative  KNEE & HIP SPECIAL TESTS: R knee ligaments intact, negative Ober's Test R  FUNCTIONAL TESTS:  5 times sit to stand: 13.6 seconds without UE assist   TODAY'S TREATMENT  06/25/21 Therapeutic Exercise: Nustep L5x46mn KTOS piriformis stretch 3x30 sec R side Figure 4 stretch 3x30 sec R side Standing trunk rotation with yellow weight ball 10x bil Standing hip hinges 10x Standing hip abduction 10x Standing marches 10x  Manual Therapy: STM to R glutes, deep hip rotators, piriformis  06/23/21 Therapeutic Exercise: Nustep L3x858m Supine KTOS stretch x 30 sec bil Supine figure 4 stretch x 30 sec bil Trunk rotation 5x5" S/L clams with red TB 10x bil Seated bil D2 flexion with red weighted ball 10x bil Seated DL 10 reps Seated lumbar flexion stretch with green pball x 30 sec hold  06/18/2021 Therapeutic Exercise: to improve strength and mobility.  Demo, verbal and tactile cues throughout for technique. - Supine Lower Trunk Rotation  10 reps - Supine Bridge  10 reps  - L HS cramp initially but subsided and able to perform - Active Straight Leg Raise with Quad Set  10 reps bil - Prone Hip Extension 5 reps bil - Prone  Press Up On Elbows  x 1 min - reported felt good on shoulders.    PATIENT EDUCATION:  Education details: Findings, POC, initial HEP.   Person educated: Patient Education method: Explanation, Demonstration, Verbal cues, and Handouts Education comprehension: verbalized understanding   HOME EXERCISE PROGRAM: Access Code: [email or web address]  ASSESSMENT:  CLINICAL IMPRESSION: Pt responded well to treatment. Started with STM, pt noted decreased muscle restriction in gluteal region. Cueing provided throughout session for posture and correct form with exercises. Pt showed no issues with the progression of exercises.   OBJECTIVE IMPAIRMENTS decreased activity tolerance, decreased endurance, decreased mobility, difficulty walking, decreased ROM, decreased strength, hypomobility, impaired perceived functional ability, increased muscle spasms, impaired flexibility, postural dysfunction, obesity, and pain.   ACTIVITY LIMITATIONS carrying, lifting, bending, sleeping, and locomotion level  PARTICIPATION LIMITATIONS: cleaning, shopping, community activity, and yard work  PERSONAL FACTORS Age, Behavior pattern, Past/current experiences, Time since onset of injury/illness/exacerbation, and 3+ comorbidities: Chronic kidney disease, gout, history of R kidney surgery, abdominal surgeries, prostate and skin cancer, obesity, HTN, chronic bil shoulder pain, chronic LBP, neck pain.  are also affecting patient's functional outcome.   REHAB POTENTIAL: Good  CLINICAL DECISION MAKING: Evolving/moderate complexity  EVALUATION COMPLEXITY: Moderate   GOALS: Goals reviewed with patient? Yes  SHORT TERM GOALS: Target date: 07/02/2021   Patient will be independent with initial HEP.  Baseline: given Goal status: IN PROGRESS   LONG TERM GOALS: Target date: 07/30/2021    Patient will be independent with advanced/ongoing HEP to improve outcomes and carryover.  Baseline: needs advancement Goal status: IN  PROGRESS  2.  Patient will report 75% improvement in R hip/ low back pain to improve QOL.  Baseline: 8-9/10 R hip, LB 6-7/10 Goal status: IN PROGRESS  3.  Patient will demonstrate 4+/5 bil LE strength throughout to decrease muscle imbalance. Baseline: see objective.  Also weakness in core.  Goal status: IN PROGRESS  4.  Patient will report 58% on LEFS to demonstrate improved functional ability.  Baseline: 46% Goal status: IN PROGRESS   5.  Patient will tolerate 30 min of walking without increased R hip/knee pain in order to participate in regular exercise. Baseline: unable Goal status: IN PROGRESS  8.  Patient will be able to transition to community based exercise program to maintain gains.  Baseline: unable Goal status: IN PROGRESS    PLAN: PT FREQUENCY: 1-2x/week  PT DURATION: 6 weeks  PLANNED INTERVENTIONS: Therapeutic exercises, Therapeutic activity, Neuromuscular re-education, Balance training, Gait training, Patient/Family education, Joint mobilization, Stair training, Dry Needling, Electrical stimulation, Spinal mobilization, Cryotherapy, Moist heat, Traction, Ultrasound, Ionotophoresis '4mg'$ /ml Dexamethasone, and Manual therapy.  PLAN FOR NEXT SESSION: review and progress HEP for core strengthening, LE strengthening, whole body movements.  Manual therapy/modalities PRN,    Artist Pais, PTA 06/25/2021, 5:55 PM

## 2021-07-01 ENCOUNTER — Encounter: Payer: Medicare Other | Admitting: Physical Therapy

## 2021-07-02 ENCOUNTER — Ambulatory Visit: Payer: Medicare Other | Admitting: Physical Therapy

## 2021-07-02 ENCOUNTER — Encounter: Payer: Self-pay | Admitting: Physical Therapy

## 2021-07-02 DIAGNOSIS — M6281 Muscle weakness (generalized): Secondary | ICD-10-CM

## 2021-07-02 DIAGNOSIS — M25551 Pain in right hip: Secondary | ICD-10-CM | POA: Diagnosis not present

## 2021-07-02 DIAGNOSIS — M25511 Pain in right shoulder: Secondary | ICD-10-CM | POA: Diagnosis not present

## 2021-07-02 DIAGNOSIS — M5459 Other low back pain: Secondary | ICD-10-CM

## 2021-07-02 DIAGNOSIS — G8929 Other chronic pain: Secondary | ICD-10-CM | POA: Diagnosis not present

## 2021-07-02 DIAGNOSIS — M25561 Pain in right knee: Secondary | ICD-10-CM | POA: Diagnosis not present

## 2021-07-02 DIAGNOSIS — M25512 Pain in left shoulder: Secondary | ICD-10-CM | POA: Diagnosis not present

## 2021-07-02 DIAGNOSIS — M533 Sacrococcygeal disorders, not elsewhere classified: Secondary | ICD-10-CM | POA: Diagnosis not present

## 2021-07-02 NOTE — Therapy (Signed)
OUTPATIENT PHYSICAL THERAPY TREATMENT   Patient Name: David [name] MRN: [id number] DOB:[birth year], 76 y.o., male Today's Date: 07/02/2021   PT End of Session - 07/02/21 1617     Visit Number 4    Number of Visits 12    Date for PT Re-Evaluation 07/30/21    PT Start Time 3710    PT Stop Time 1700    PT Time Calculation (min) 43 min    Activity Tolerance Patient tolerated treatment well    Behavior During Therapy [facility] for tasks assessed/performed               Past Medical History:  Diagnosis Date   Adenoma of right adrenal gland 07/11/2002   2.4cm , noted on CT ABD   Allergy    Anxiety    Arthritis of both knees 03/26/2016   Astigmatism    Back pain    BCC (basal cell carcinoma of skin)    under right eye and right ear   Blood transfusion without reported diagnosis    Cataract 09/29/2016   Depression    Diverticulitis 2009   Diverticulosis    Family history of breast cancer    Family history of colon cancer    Fatty liver    GERD (gastroesophageal reflux disease)    Gout    Hearing loss of both ears 03/26/2016   History of atrial fibrillation    x 2 none since 2020   History of chronic prostatitis    started at age 60   History of kidney stones    Hyperglycemia    Hyperlipidemia    Hypertension    Incomplete right bundle branch block (RBBB)    Internal hemorrhoids    Kidney lesion 06/07/2015   Right midportion, 1.1x1.1 cm hyperechoic, noted on [place] ABD   LAFB (left anterior fascicular block)    Lipoma of axilla 09/2016   Liver lesion, right lobe 06/07/2015   2.5x2.4x2.4 cm hypoechoic lesion posterior aspect, noted on [place] ABD   Low back pain 03/26/2016   LVH (left ventricular hypertrophy) 08/06/2017   Moderate, Noted on ECHO   Medicare annual wellness visit, subsequent 03/12/2014   OA (osteoarthritis)    Back, Hands, Neck   Obesity 11/25/2007   Qualifier: Diagnosis of  By: Lenna Gilford MD, Deborra Medina    Other malaise and fatigue 03/13/2013   Premature  ventricular complex    Prostate cancer ([place]) dx 2021   Renal insufficiency    Kidney removed right   Scoliosis    Upper thoracic and lumbar   Sinus bradycardia 08/06/2017   Noted on EKG   Vitamin D deficiency    resolved   Past Surgical History:  Procedure Laterality Date   CARDIOVERSION  07/31/2017   CATARACT EXTRACTION, BILATERAL     COLON SURGERY  2009   segmental sigmoid resection   COLONOSCOPY     CYSTOSCOPY N/A 07/28/2019   Procedure: CYSTOSCOPY FLEXIBLE;  Surgeon: Cleon Gustin, MD;  Location: [facility];  Service: Urology;  Laterality: N/A;   CYSTOSCOPY W/ RETROGRADES Right 06/07/2018   Procedure: CYSTOSCOPY WITH RETROGRADE PYELOGRAM, uphrostogram;  Surgeon: Cleon Gustin, MD;  Location: [facility];  Service: Urology;  Laterality: Right;   CYSTOSCOPY WITH URETEROSCOPY AND STENT PLACEMENT Right 02/28/2018   Procedure: CYSTOSCOPY WITH URETEROSCOPY [place];  Surgeon: Kathie Rhodes, MD;  Location: [facility];  Service: Urology;  Laterality: Right;   CYSTOSCOPY/URETEROSCOPY/HOLMIUM LASER/STENT PLACEMENT Right 11/29/2017   Procedure: CYSTOSCOPY/RETROGRADE/URETEROSCOPY/HOLMIUM LASER/STENT PLACEMENT;  Surgeon: Kathie Rhodes, MD;  Location: [facility];  Service: Urology;  Laterality: Right;   EYE SURGERY Bilateral 01/12/2017   cataract removal   history of blood tranfusion  age 7   HOLMIUM LASER APPLICATION Right 08/09/2058   Procedure: HOLMIUM LASER APPLICATION;  Surgeon: Kathie Rhodes, MD;  Location[facility];  Service: Urology;  Laterality: Right;   IR BALLOON DILATION URETERAL STRICTURE RIGHT  03/14/2018   IR NEPHRO TUBE REMOV/FL  03/17/2018   IR NEPHROSTOGRAM RIGHT THRU EXISTING ACCESS  03/17/2018   IR NEPHROSTOMY EXCHANGE RIGHT  03/14/2018   IR NEPHROSTOMY EXCHANGE RIGHT  06/21/2018   IR NEPHROSTOMY PLACEMENT RIGHT  03/02/2018   IR NEPHROSTOMY PLACEMENT RIGHT  04/20/2018   IR URETERAL STENT PLACEMENT EXISTING ACCESS RIGHT  03/14/2018    NOSE SURGERY     Submucous resection age 50   NUCLEAR STRESS TEST  06/03/2009   RADIOACTIVE SEED IMPLANT N/A 07/28/2019   Procedure: RADIOACTIVE SEED IMPLANT/BRACHYTHERAPY IMPLANT;  Surgeon: Cleon Gustin, MD;  Location: [facility];  Service: Urology;  Laterality: N/A;  90 MINS   ROBOT ASSISTED LAPAROSCOPIC NEPHRECTOMY Right 08/04/2018   Procedure: XI ROBOTIC ASSISTED LAPAROSCOPIC NEPHRECTOMY;  Surgeon: Cleon Gustin, MD;  Location: [facility];  Service: Urology;  Laterality: Right;  3 HRS   ROBOT ASSISTED PYELOPLASTY Right 06/07/2018   Procedure: attempted XI ROBOTIC ASSISTED PYELOPLASTY, lysis of adhesions;  Surgeon: Cleon Gustin, MD;  Location: [facility];  Service: Urology;  Laterality: Right;  3 HRS   SKIN BIOPSY     SPACE OAR INSTILLATION N/A 07/28/2019   Procedure: SPACE OAR INSTILLATION;  Surgeon: Cleon Gustin, MD;  Location[facility];  Service: Urology;  Laterality: N/A;   URETEROSCOPY WITH HOLMIUM LASER LITHOTRIPSY Right 09/24/2017   Procedure: CYSTOSCOPY, URETEROSCOPY WITH HOLMIUM LASER LITHOTRIPSY, STENT PLACEMENT;  Surgeon: Kathie Rhodes, MD;  Location: [facility];  Service: Urology;  Laterality: Right;   Patient Active Problem List   Diagnosis Date Noted   Coronary atherosclerosis 02/11/2021   Myalgia 11/04/2020   Neck pain 11/04/2020   Stage 3a chronic kidney disease ([place]) 07/02/2020   Urinary frequency 02/12/2020   Genetic testing 09/26/2019   Family history of breast cancer    Family history of colon cancer    Educated about COVID-19 virus infection 08/26/2019   Malignant neoplasm of prostate ([place]) 04/28/2019   Pruritus 02/03/2019   LVH (left ventricular hypertrophy) 10/30/2018   Tinea versicolor 09/28/2018   Thoracic back pain 09/26/2018   Hypercalcemia 08/08/2018   Nonfunctioning kidney 08/04/2018   Arm pain 07/18/2018   Right hip pain 06/22/2018   Ureteral obstruction 06/07/2018   Recurrent  nephrolithiasis 04/23/2018   Hydronephrosis 03/01/2018   Floppy eyelid syndrome of both eyes 01/24/2018   Foot pain, bilateral 12/28/2017   Kidney stone 09/18/2017   Insomnia 09/17/2017   Abnormal TSH 04/15/2017   Senile nuclear sclerosis 01/18/2017   Cataract 09/29/2016   Muscle cramp 09/29/2016   Nocturia 09/29/2016   Hyperglycemia 03/29/2016   Low back pain 03/26/2016   Cataracts, bilateral 03/26/2016   Arthritis of both knees 03/26/2016   Hearing loss 03/26/2016   Complex renal cyst 06/16/2015   History of atrial fibrillation 06/16/2015   Snoring 06/07/2015   Somnolence 06/07/2015   Preventative health care 03/07/2015   Lipoma of axilla 06/04/2014   Tinea cruris 15/61/5379   Lichen simplex chronicus 03/20/2014   Medicare annual wellness visit, subsequent 03/12/2014   Depression with anxiety  09/25/2013   Constipation 09/25/2013   Gouty arthritis of toe of left foot 08/01/2013   Skin lesion 05/27/2013   Rash 05/26/2013   Abnormal liver function 03/18/2013   IBS (irritable bowel syndrome) 03/18/2013   Other malaise and fatigue 03/13/2013   Tinea corporis 03/13/2013   Gout 12/14/2012   Diarrhea 12/14/2012   Rectal irritation 09/18/2012   Diverticulosis    BCC (basal cell carcinoma of skin)    Scoliosis 01/19/2011   Hyperlipidemia, mixed 03/11/2010   Essential hypertension, benign 03/11/2010   ALLERGIC RHINITIS 03/11/2010   Arthralgia 03/11/2010   Obesity 11/25/2007   BENIGN NEOPLASM OF ADRENAL GLAND 09/22/2007   Fatty liver disease, nonalcoholic 20/25/4270   Osteoarthritis 09/22/2007   SPONDYLOSIS, LUMBAR 09/22/2007   GERD 04/12/2007    PCP: Mosie Lukes MD  REFERRING PROVIDER: Terrilyn Saver, NP  REFERRING DIAG: [phone] (ICD-10-CM) - Chronic pain of right knee M25.511,G89.29,M25.512 (ICD-10-CM) - Chronic pain of both shoulders M25.551 (ICD-10-CM) - Right hip pain   Rationale for Evaluation and Treatment Rehabilitation  THERAPY DIAG:  Other  low back pain  SI (sacroiliac) pain  Chronic pain of right knee  Muscle weakness (generalized)  ONSET DATE: chronic, worsened over several years  SUBJECTIVE:                                                                                                                                                                                           SUBJECTIVE STATEMENT: Pt reports moving slowly today.  He reports he has been more active, he has been stretching and walking at [employer].  Walking 25 min about.  Last time he reported he had no pain after PTA worked on back.   PERTINENT HISTORY:  Chronic kidney disease, gout, history of R kidney surgery, abdominal surgeries, prostate and skin cancer, obesity, HTN, chronic bil shoulder pain, chronic LBP, neck pain.   PAIN:  Are you having pain? Yes: NPRS scale: 5/10 Pain location: R hip   PRECAUTIONS: None  WEIGHT BEARING RESTRICTIONS No  FALLS:  Has patient fallen in last 6 months? No  LIVING ENVIRONMENT: Lives with: lives with their spouse Lives in: House/apartment Stairs: Yes: Internal: 12 steps; can reach both to office upstairs Has following equipment at home: Single point cane uses occasionally due to gout  OCCUPATION: retired Optometrist  PLOF: Independent  PATIENT GOALS loosen up a little, get back to walking   OBJECTIVE:   DIAGNOSTIC FINDINGS:  05/27/2021  Xrays: Hip Bilateral hip joint degenerative changes. Left Shoulder L Mild degenerative changes of the glenohumeral joint. R shoulder Mild AC and glenohumeral joint degenerative changes. R knee Chondrocalcinosis.  Mild patellofemoral joint degenerative changes. Mild medial compartment joint degenerative changes.  PATIENT SURVEYS:  LEFS 46%  COGNITION:  Overall cognitive status: Within functional limits for tasks assessed     SENSATION: Not tested  MUSCLE LENGTH: Hamstrings: mild tightness Quadriceps: mild tightness bil  POSTURE: rounded shoulders and  forward head  PALPATION: Tenderness R SIJ, R piriformis, R glute medius.  Decreased mobility lumbar spine with PA mobs, reports tenderness L3-5.  Tenderness R quadriceps tendon.   NO tenderness R hip trochanter.   LUMBAR ROM:   Active  AROM  eval  Flexion To toes  Extension 75%, inc pain midline LB  Right lateral flexion To knee  Left lateral flexion To knee  Right rotation 75%, Inc pain LB  Left rotation 75%, Inc pain LB   (Blank rows = not tested)  LOWER EXTREMITY ROM:   Hip ROM WNL and symmetric bil, no pain.     LOWER EXTREMITY MMT:    MMT Right eval Left eval  Hip flexion 4 4  Hip extension 4 4  Hip abduction 5 5  Hip adduction 5 5  Knee flexion 4 4  Knee extension 5 5  Ankle dorsiflexion 4 4   (Blank rows = not tested)  LUMBAR SPECIAL TESTS:  Straight leg raise test: Negative and FABER test: Negative  KNEE & HIP SPECIAL TESTS: R knee ligaments intact, negative Ober's Test R  FUNCTIONAL TESTS:  5 times sit to stand: 13.6 seconds without UE assist   TODAY'S TREATMENT  07/02/21 Therapeutic Exercise: to improve strength and mobility.  Demo, verbal and tactile cues throughout for technique. Nustep L5 x 6 min  At counter for safety Heel/toe raises x 10 Hip abduction x 10 bil  Hip extension x 10 bil  Hip flexion x 10 bil  Sit to stands 2 x 10 seated rest break between Bridges 2 x 10 KTOS piriformis stretch 3 x 15 sec bil - needed HOH assist and VC throughout.  Supine leg raises x 10 bil  Manual Therapy: to decrease muscle spasm and pain and improve mobility   IASTM with foam roller to bil glutes/proximal hamstrings and QL   06/25/21 Therapeutic Exercise: Nustep L5x42mn KTOS piriformis stretch 3x30 sec R side Figure 4 stretch 3x30 sec R side Standing trunk rotation with yellow weight ball 10x bil Standing hip hinges 10x Standing hip abduction 10x Standing marches 10x  Manual Therapy: STM to R glutes, deep hip rotators,  piriformis  06/23/21 Therapeutic Exercise: Nustep L3x854m Supine KTOS stretch x 30 sec bil Supine figure 4 stretch x 30 sec bil Trunk rotation 5x5" S/L clams with red TB 10x bil Seated bil D2 flexion with red weighted ball 10x bil Seated DL 10 reps Seated lumbar flexion stretch with green pball x 30 sec hold   PATIENT EDUCATION:  Education details: reviewed and updated HEP.  Person educated: Patient Education method: ExConsulting civil engineerDemonstration, Verbal cues, and Handouts Education comprehension: verbalized understanding   HOME EXERCISE PROGRAM: Access Code: [email or web address]  ASSESSMENT:  CLINICAL IMPRESSION: Pt fatigued very quickly with exercises today, noted some dyspnea on exertion especially with sit to stands.  He did report some discomfort in knees with sit to stands but tolerable.  Needed HOH assistance to perform stretches correctly.  Reviewed HEP, splitting exercises into 2 groups - standing exercises at counter and bed exercises.  IASTM at end of session to decrease tightness and pain.  He would benefit from continued skilled therapy.    OBJECTIVE IMPAIRMENTS decreased  activity tolerance, decreased endurance, decreased mobility, difficulty walking, decreased ROM, decreased strength, hypomobility, impaired perceived functional ability, increased muscle spasms, impaired flexibility, postural dysfunction, obesity, and pain.   ACTIVITY LIMITATIONS carrying, lifting, bending, sleeping, and locomotion level  PARTICIPATION LIMITATIONS: cleaning, shopping, community activity, and yard work  PERSONAL FACTORS Age, Behavior pattern, Past/current experiences, Time since onset of injury/illness/exacerbation, and 3+ comorbidities: Chronic kidney disease, gout, history of R kidney surgery, abdominal surgeries, prostate and skin cancer, obesity, HTN, chronic bil shoulder pain, chronic LBP, neck pain.  are also affecting patient's functional outcome.   REHAB POTENTIAL: Good  CLINICAL  DECISION MAKING: Evolving/moderate complexity  EVALUATION COMPLEXITY: Moderate   GOALS: Goals reviewed with patient? Yes  SHORT TERM GOALS: Target date: 07/02/2021   Patient will be independent with initial HEP.  Baseline: given Goal status: IN PROGRESS   LONG TERM GOALS: Target date: 07/30/2021    Patient will be independent with advanced/ongoing HEP to improve outcomes and carryover.  Baseline: needs advancement Goal status: IN PROGRESS  2.  Patient will report 75% improvement in R hip/ low back pain to improve QOL.  Baseline: 8-9/10 R hip, LB 6-7/10 Goal status: IN PROGRESS  3.  Patient will demonstrate 4+/5 bil LE strength throughout to decrease muscle imbalance. Baseline: see objective.  Also weakness in core.  Goal status: IN PROGRESS  4.  Patient will report 58% on LEFS to demonstrate improved functional ability.  Baseline: 46% Goal status: IN PROGRESS   5.  Patient will tolerate 30 min of walking without increased R hip/knee pain in order to participate in regular exercise. Baseline: unable Goal status: IN PROGRESS  8.  Patient will be able to transition to community based exercise program to maintain gains.  Baseline: unable Goal status: IN PROGRESS    PLAN: PT FREQUENCY: 1-2x/week  PT DURATION: 6 weeks  PLANNED INTERVENTIONS: Therapeutic exercises, Therapeutic activity, Neuromuscular re-education, Balance training, Gait training, Patient/Family education, Joint mobilization, Stair training, Dry Needling, Electrical stimulation, Spinal mobilization, Cryotherapy, Moist heat, Traction, Ultrasound, Ionotophoresis '4mg'$ /ml Dexamethasone, and Manual therapy.  PLAN FOR NEXT SESSION: continue LE strengthening, whole body movements.  Manual therapy/modalities PRN,    Rennie Natter, PT, DPT  07/02/2021, 5:08 PM

## 2021-07-07 ENCOUNTER — Encounter: Payer: Medicare Other | Admitting: Physical Therapy

## 2021-07-09 ENCOUNTER — Ambulatory Visit: Payer: Medicare Other | Admitting: Physical Therapy

## 2021-07-10 ENCOUNTER — Encounter: Payer: Medicare Other | Admitting: Physical Therapy

## 2021-07-14 ENCOUNTER — Encounter: Payer: Medicare Other | Admitting: Physical Therapy

## 2021-07-16 ENCOUNTER — Encounter: Payer: Self-pay | Admitting: Physical Therapy

## 2021-07-16 ENCOUNTER — Other Ambulatory Visit (INDEPENDENT_AMBULATORY_CARE_PROVIDER_SITE_OTHER): Payer: Medicare Other

## 2021-07-16 ENCOUNTER — Ambulatory Visit: Payer: Medicare Other | Attending: Family Medicine | Admitting: Physical Therapy

## 2021-07-16 DIAGNOSIS — Z8546 Personal history of malignant neoplasm of prostate: Secondary | ICD-10-CM

## 2021-07-16 DIAGNOSIS — M6281 Muscle weakness (generalized): Secondary | ICD-10-CM | POA: Diagnosis not present

## 2021-07-16 DIAGNOSIS — E782 Mixed hyperlipidemia: Secondary | ICD-10-CM

## 2021-07-16 DIAGNOSIS — M25561 Pain in right knee: Secondary | ICD-10-CM | POA: Diagnosis not present

## 2021-07-16 DIAGNOSIS — G8929 Other chronic pain: Secondary | ICD-10-CM | POA: Diagnosis not present

## 2021-07-16 DIAGNOSIS — R739 Hyperglycemia, unspecified: Secondary | ICD-10-CM

## 2021-07-16 DIAGNOSIS — M1A9XX0 Chronic gout, unspecified, without tophus (tophi): Secondary | ICD-10-CM

## 2021-07-16 DIAGNOSIS — I10 Essential (primary) hypertension: Secondary | ICD-10-CM | POA: Diagnosis not present

## 2021-07-16 DIAGNOSIS — M533 Sacrococcygeal disorders, not elsewhere classified: Secondary | ICD-10-CM | POA: Insufficient documentation

## 2021-07-16 DIAGNOSIS — M5459 Other low back pain: Secondary | ICD-10-CM | POA: Insufficient documentation

## 2021-07-16 NOTE — Therapy (Signed)
OUTPATIENT PHYSICAL THERAPY TREATMENT   Patient Name: ALEXA GOLEBIEWSKI MRN: [id number] DOB:[birth year], 76 y.o., male Today's Date: 07/16/2021   PT End of Session - 07/16/21 1406     Visit Number 5    Number of Visits 12    Date for PT Re-Evaluation 07/30/21    PT Start Time 1406    PT Stop Time 8413    PT Time Calculation (min) 39 min    Activity Tolerance Patient tolerated treatment well    Behavior During Therapy [facility] for tasks assessed/performed               Past Medical History:  Diagnosis Date   Adenoma of right adrenal gland 07/11/2002   2.4cm , noted on CT ABD   Allergy    Anxiety    Arthritis of both knees 03/26/2016   Astigmatism    Back pain    BCC (basal cell carcinoma of skin)    under right eye and right ear   Blood transfusion without reported diagnosis    Cataract 09/29/2016   Depression    Diverticulitis 2009   Diverticulosis    Family history of breast cancer    Family history of colon cancer    Fatty liver    GERD (gastroesophageal reflux disease)    Gout    Hearing loss of both ears 03/26/2016   History of atrial fibrillation    x 2 none since 2020   History of chronic prostatitis    started at age 59   History of kidney stones    Hyperglycemia    Hyperlipidemia    Hypertension    Incomplete right bundle branch block (RBBB)    Internal hemorrhoids    Kidney lesion 06/07/2015   Right midportion, 1.1x1.1 cm hyperechoic, noted on [place] ABD   LAFB (left anterior fascicular block)    Lipoma of axilla 09/2016   Liver lesion, right lobe 06/07/2015   2.5x2.4x2.4 cm hypoechoic lesion posterior aspect, noted on [place] ABD   Low back pain 03/26/2016   LVH (left ventricular hypertrophy) 08/06/2017   Moderate, Noted on ECHO   Medicare annual wellness visit, subsequent 03/12/2014   OA (osteoarthritis)    Back, Hands, Neck   Obesity 11/25/2007   Qualifier: Diagnosis of  By: Lenna Gilford MD, Deborra Medina    Other malaise and fatigue 03/13/2013   Premature  ventricular complex    Prostate cancer ([place]) dx 2021   Renal insufficiency    Kidney removed right   Scoliosis    Upper thoracic and lumbar   Sinus bradycardia 08/06/2017   Noted on EKG   Vitamin D deficiency    resolved   Past Surgical History:  Procedure Laterality Date   CARDIOVERSION  07/31/2017   CATARACT EXTRACTION, BILATERAL     COLON SURGERY  2009   segmental sigmoid resection   COLONOSCOPY     CYSTOSCOPY N/A 07/28/2019   Procedure: CYSTOSCOPY FLEXIBLE;  Surgeon: Cleon Gustin, MD;  Location[facility];  Service: Urology;  Laterality: N/A;   CYSTOSCOPY W/ RETROGRADES Right 06/07/2018   Procedure: CYSTOSCOPY WITH RETROGRADE PYELOGRAM, uphrostogram;  Surgeon: Cleon Gustin, MD;  Location: [facility];  Service: Urology;  Laterality: Right;   CYSTOSCOPY WITH URETEROSCOPY AND STENT PLACEMENT Right 02/28/2018   Procedure: CYSTOSCOPY WITH URETEROSCOPY [place];  Surgeon: Kathie Rhodes, MD;  Location: [facility];  Service: Urology;  Laterality: Right;   CYSTOSCOPY/URETEROSCOPY/HOLMIUM LASER/STENT PLACEMENT Right 11/29/2017   Procedure: CYSTOSCOPY/RETROGRADE/URETEROSCOPY/HOLMIUM LASER/STENT PLACEMENT;  Surgeon: Kathie Rhodes, MD;  Location: [facility];  Service: Urology;  Laterality: Right;   EYE SURGERY Bilateral 01/12/2017   cataract removal   history of blood tranfusion  age 7   HOLMIUM LASER APPLICATION Right 08/09/2058   Procedure: HOLMIUM LASER APPLICATION;  Surgeon: Kathie Rhodes, MD;  Location[facility];  Service: Urology;  Laterality: Right;   IR BALLOON DILATION URETERAL STRICTURE RIGHT  03/14/2018   IR NEPHRO TUBE REMOV/FL  03/17/2018   IR NEPHROSTOGRAM RIGHT THRU EXISTING ACCESS  03/17/2018   IR NEPHROSTOMY EXCHANGE RIGHT  03/14/2018   IR NEPHROSTOMY EXCHANGE RIGHT  06/21/2018   IR NEPHROSTOMY PLACEMENT RIGHT  03/02/2018   IR NEPHROSTOMY PLACEMENT RIGHT  04/20/2018   IR URETERAL STENT PLACEMENT EXISTING ACCESS RIGHT  03/14/2018    NOSE SURGERY     Submucous resection age 50   NUCLEAR STRESS TEST  06/03/2009   RADIOACTIVE SEED IMPLANT N/A 07/28/2019   Procedure: RADIOACTIVE SEED IMPLANT/BRACHYTHERAPY IMPLANT;  Surgeon: Cleon Gustin, MD;  Location: [facility];  Service: Urology;  Laterality: N/A;  90 MINS   ROBOT ASSISTED LAPAROSCOPIC NEPHRECTOMY Right 08/04/2018   Procedure: XI ROBOTIC ASSISTED LAPAROSCOPIC NEPHRECTOMY;  Surgeon: Cleon Gustin, MD;  Location: [facility];  Service: Urology;  Laterality: Right;  3 HRS   ROBOT ASSISTED PYELOPLASTY Right 06/07/2018   Procedure: attempted XI ROBOTIC ASSISTED PYELOPLASTY, lysis of adhesions;  Surgeon: Cleon Gustin, MD;  Location: [facility];  Service: Urology;  Laterality: Right;  3 HRS   SKIN BIOPSY     SPACE OAR INSTILLATION N/A 07/28/2019   Procedure: SPACE OAR INSTILLATION;  Surgeon: Cleon Gustin, MD;  Location[facility];  Service: Urology;  Laterality: N/A;   URETEROSCOPY WITH HOLMIUM LASER LITHOTRIPSY Right 09/24/2017   Procedure: CYSTOSCOPY, URETEROSCOPY WITH HOLMIUM LASER LITHOTRIPSY, STENT PLACEMENT;  Surgeon: Kathie Rhodes, MD;  Location: [facility];  Service: Urology;  Laterality: Right;   Patient Active Problem List   Diagnosis Date Noted   Coronary atherosclerosis 02/11/2021   Myalgia 11/04/2020   Neck pain 11/04/2020   Stage 3a chronic kidney disease ([place]) 07/02/2020   Urinary frequency 02/12/2020   Genetic testing 09/26/2019   Family history of breast cancer    Family history of colon cancer    Educated about COVID-19 virus infection 08/26/2019   Malignant neoplasm of prostate ([place]) 04/28/2019   Pruritus 02/03/2019   LVH (left ventricular hypertrophy) 10/30/2018   Tinea versicolor 09/28/2018   Thoracic back pain 09/26/2018   Hypercalcemia 08/08/2018   Nonfunctioning kidney 08/04/2018   Arm pain 07/18/2018   Right hip pain 06/22/2018   Ureteral obstruction 06/07/2018   Recurrent  nephrolithiasis 04/23/2018   Hydronephrosis 03/01/2018   Floppy eyelid syndrome of both eyes 01/24/2018   Foot pain, bilateral 12/28/2017   Kidney stone 09/18/2017   Insomnia 09/17/2017   Abnormal TSH 04/15/2017   Senile nuclear sclerosis 01/18/2017   Cataract 09/29/2016   Muscle cramp 09/29/2016   Nocturia 09/29/2016   Hyperglycemia 03/29/2016   Low back pain 03/26/2016   Cataracts, bilateral 03/26/2016   Arthritis of both knees 03/26/2016   Hearing loss 03/26/2016   Complex renal cyst 06/16/2015   History of atrial fibrillation 06/16/2015   Snoring 06/07/2015   Somnolence 06/07/2015   Preventative health care 03/07/2015   Lipoma of axilla 06/04/2014   Tinea cruris 15/61/5379   Lichen simplex chronicus 03/20/2014   Medicare annual wellness visit, subsequent 03/12/2014   Depression with anxiety  09/25/2013   Constipation 09/25/2013   Gouty arthritis of toe of left foot 08/01/2013   Skin lesion 05/27/2013   Rash 05/26/2013   Abnormal liver function 03/18/2013   IBS (irritable bowel syndrome) 03/18/2013   Other malaise and fatigue 03/13/2013   Tinea corporis 03/13/2013   Gout 12/14/2012   Diarrhea 12/14/2012   Rectal irritation 09/18/2012   Diverticulosis    BCC (basal cell carcinoma of skin)    Scoliosis 01/19/2011   Hyperlipidemia, mixed 03/11/2010   Essential hypertension, benign 03/11/2010   ALLERGIC RHINITIS 03/11/2010   Arthralgia 03/11/2010   Obesity 11/25/2007   BENIGN NEOPLASM OF ADRENAL GLAND 09/22/2007   Fatty liver disease, nonalcoholic 93/81/0175   Osteoarthritis 09/22/2007   SPONDYLOSIS, LUMBAR 09/22/2007   GERD 04/12/2007    PCP: Mosie Lukes MD  REFERRING PROVIDER: Terrilyn Saver, NP  REFERRING DIAG: [phone] (ICD-10-CM) - Chronic pain of right knee M25.511,G89.29,M25.512 (ICD-10-CM) - Chronic pain of both shoulders M25.551 (ICD-10-CM) - Right hip pain   Rationale for Evaluation and Treatment Rehabilitation  THERAPY DIAG:  Other  low back pain  SI (sacroiliac) pain  Chronic pain of right knee  Muscle weakness (generalized)  ONSET DATE: chronic, worsened over several years  SUBJECTIVE:                                                                                                                                                                                           SUBJECTIVE STATEMENT: Pt reports moving more slowly than last time, and twisted L knee.  It hurts when he twists his knee went weightbearing.  Said couldn't hardly walk after sit to stands last session.    PERTINENT HISTORY:  Chronic kidney disease, gout, history of R kidney surgery, abdominal surgeries, prostate and skin cancer, obesity, HTN, chronic bil shoulder pain, chronic LBP, neck pain.   PAIN:  Are you having pain? Yes: NPRS scale: 5/10 Pain location: L knee, R chest   PRECAUTIONS: None  WEIGHT BEARING RESTRICTIONS No  FALLS:  Has patient fallen in last 6 months? No  LIVING ENVIRONMENT: Lives with: lives with their spouse Lives in: House/apartment Stairs: Yes: Internal: 12 steps; can reach both to office upstairs Has following equipment at home: Single point cane uses occasionally due to gout  OCCUPATION: retired Optometrist  PLOF: Independent  PATIENT GOALS loosen up a little, get back to walking   OBJECTIVE:   DIAGNOSTIC FINDINGS:  05/27/2021  Xrays: Hip Bilateral hip joint degenerative changes. Left Shoulder L Mild degenerative changes of the glenohumeral joint. R shoulder Mild AC and glenohumeral joint degenerative changes. R knee Chondrocalcinosis. Mild patellofemoral joint degenerative changes.  Mild medial compartment joint degenerative changes.  PATIENT SURVEYS:  LEFS 46%  COGNITION:  Overall cognitive status: Within functional limits for tasks assessed     SENSATION: Not tested  MUSCLE LENGTH: Hamstrings: mild tightness Quadriceps: mild tightness bil  POSTURE: rounded shoulders and forward  head  PALPATION: Tenderness R SIJ, R piriformis, R glute medius.  Decreased mobility lumbar spine with PA mobs, reports tenderness L3-5.  Tenderness R quadriceps tendon.   NO tenderness R hip trochanter.   LUMBAR ROM:   Active  AROM  eval  Flexion To toes  Extension 75%, inc pain midline LB  Right lateral flexion To knee  Left lateral flexion To knee  Right rotation 75%, Inc pain LB  Left rotation 75%, Inc pain LB   (Blank rows = not tested)  LOWER EXTREMITY ROM:   Hip ROM WNL and symmetric bil, no pain.     LOWER EXTREMITY MMT:    MMT Right eval Left eval  Hip flexion 4 4  Hip extension 4 4  Hip abduction 5 5  Hip adduction 5 5  Knee flexion 4 4  Knee extension 5 5  Ankle dorsiflexion 4 4   (Blank rows = not tested)  LUMBAR SPECIAL TESTS:  Straight leg raise test: Negative and FABER test: Negative  KNEE & HIP SPECIAL TESTS: R knee ligaments intact, negative Ober's Test R  FUNCTIONAL TESTS:  5 times sit to stand: 13.6 seconds without UE assist   TODAY'S TREATMENT  07/16/2021 Therapeutic Exercise: to improve strength and mobility.  Demo, verbal and tactile cues throughout for technique. Nustep L5 x 6 min  At counter for safety with SBA Heel/toe raises x 10 Hip abduction x 10 bil  Hip extension x 10 bil  Hip flexion x 10 bil  In supine (head elevated due to shortness of breath)   SLR 2 x 10 bil  Bridges 2 x 10 Marches with TrA contraction 2 x 20  Manual Therapy: to decrease muscle spasm and pain and improve mobility   IASTM with foam roller to bil glutes/proximal hamstrings and QL  07/02/21 Therapeutic Exercise: to improve strength and mobility.  Demo, verbal and tactile cues throughout for technique. Nustep L5 x 6 min  At counter for safety Heel/toe raises x 10 Hip abduction x 10 bil  Hip extension x 10 bil  Hip flexion x 10 bil  Sit to stands 2 x 10 seated rest break between Bridges 2 x 10 KTOS piriformis stretch 3 x 15 sec bil - needed HOH assist and  VC throughout.  Supine leg raises x 10 bil  Manual Therapy: to decrease muscle spasm and pain and improve mobility   IASTM with foam roller to bil glutes/proximal hamstrings and QL   06/25/21 Therapeutic Exercise: Nustep L5x69mn KTOS piriformis stretch 3x30 sec R side Figure 4 stretch 3x30 sec R side Standing trunk rotation with yellow weight ball 10x bil Standing hip hinges 10x Standing hip abduction 10x Standing marches 10x  Manual Therapy: STM to R glutes, deep hip rotators, piriformis   PATIENT EDUCATION:  Education details: reviewed and updated HEP.  Person educated: Patient Education method: Explanation, Demonstration, Verbal cues, and Handouts Education comprehension: verbalized understanding   HOME EXERCISE PROGRAM: Access Code: [email or web address]   ASSESSMENT:  CLINICAL IMPRESSION: Pt reported increased L knee pain today after twisting it, and increased soreness after last session's sit to stands, so deferred sit to stands today.  He was able to tolerate all other exercises without increased discomfort.  [name] continues to demonstrate potential for improvement and would benefit from continued skilled therapy to address impairments.        OBJECTIVE IMPAIRMENTS decreased activity tolerance, decreased endurance, decreased mobility, difficulty walking, decreased ROM, decreased strength, hypomobility, impaired perceived functional ability, increased muscle spasms, impaired flexibility, postural dysfunction, obesity, and pain.   ACTIVITY LIMITATIONS carrying, lifting, bending, sleeping, and locomotion level  PARTICIPATION LIMITATIONS: cleaning, shopping, community activity, and yard work  PERSONAL FACTORS Age, Behavior pattern, Past/current experiences, Time since onset of injury/illness/exacerbation, and 3+ comorbidities: Chronic kidney disease, gout, history of R kidney surgery, abdominal surgeries, prostate and skin cancer, obesity, HTN, chronic bil shoulder pain,  chronic LBP, neck pain.  are also affecting patient's functional outcome.   REHAB POTENTIAL: Good  CLINICAL DECISION MAKING: Evolving/moderate complexity  EVALUATION COMPLEXITY: Moderate   GOALS: Goals reviewed with patient? Yes  SHORT TERM GOALS: Target date: 07/02/2021   Patient will be independent with initial HEP.  Baseline: given Goal status: IN PROGRESS 07/16/21 focusing more on stretching exercises.    LONG TERM GOALS: Target date: 07/30/2021    Patient will be independent with advanced/ongoing HEP to improve outcomes and carryover.  Baseline: needs advancement Goal status: IN PROGRESS  2.  Patient will report 75% improvement in R hip/ low back pain to improve QOL.  Baseline: 8-9/10 R hip, LB 6-7/10 Goal status: IN PROGRESS  3.  Patient will demonstrate 4+/5 bil LE strength throughout to decrease muscle imbalance. Baseline: see objective.  Also weakness in core.  Goal status: IN PROGRESS  4.  Patient will report 58% on LEFS to demonstrate improved functional ability.  Baseline: 46% Goal status: IN PROGRESS   5.  Patient will tolerate 30 min of walking without increased R hip/knee pain in order to participate in regular exercise. Baseline: unable Goal status: IN PROGRESS  8.  Patient will be able to transition to community based exercise program to maintain gains.  Baseline: unable Goal status: IN PROGRESS    PLAN: PT FREQUENCY: 1-2x/week  PT DURATION: 6 weeks  PLANNED INTERVENTIONS: Therapeutic exercises, Therapeutic activity, Neuromuscular re-education, Balance training, Gait training, Patient/Family education, Joint mobilization, Stair training, Dry Needling, Electrical stimulation, Spinal mobilization, Cryotherapy, Moist heat, Traction, Ultrasound, Ionotophoresis '4mg'$ /ml Dexamethasone, and Manual therapy.  PLAN FOR NEXT SESSION: continue LE strengthening, whole body movements.  Manual therapy/modalities PRN,    Rennie Natter, PT, DPT  07/16/2021,  4:03 PM

## 2021-07-17 LAB — CBC WITH DIFFERENTIAL/PLATELET
Basophils Absolute: 0.1 10*3/uL (ref 0.0–0.1)
Basophils Relative: 0.9 % (ref 0.0–3.0)
Eosinophils Absolute: 0.3 10*3/uL (ref 0.0–0.7)
Eosinophils Relative: 5.1 % — ABNORMAL HIGH (ref 0.0–5.0)
HCT: 42 % (ref 39.0–52.0)
Hemoglobin: 14 g/dL (ref 13.0–17.0)
Lymphocytes Relative: 27.2 % (ref 12.0–46.0)
Lymphs Abs: 1.7 10*3/uL (ref 0.7–4.0)
MCHC: 33.3 g/dL (ref 30.0–36.0)
MCV: 94 fl (ref 78.0–100.0)
Monocytes Absolute: 0.4 10*3/uL (ref 0.1–1.0)
Monocytes Relative: 6.8 % (ref 3.0–12.0)
Neutro Abs: 3.7 10*3/uL (ref 1.4–7.7)
Neutrophils Relative %: 60 % (ref 43.0–77.0)
Platelets: 191 10*3/uL (ref 150.0–400.0)
RBC: 4.47 Mil/uL (ref 4.22–5.81)
RDW: 15 % (ref 11.5–15.5)
WBC: 6.2 10*3/uL (ref 4.0–10.5)

## 2021-07-17 LAB — COMPREHENSIVE METABOLIC PANEL
ALT: 23 U/L (ref 0–53)
AST: 20 U/L (ref 0–37)
Albumin: 4.1 g/dL (ref 3.5–5.2)
Alkaline Phosphatase: 77 U/L (ref 39–117)
BUN: 15 mg/dL (ref 6–23)
CO2: 28 mEq/L (ref 19–32)
Calcium: 9.7 mg/dL (ref 8.4–10.5)
Chloride: 103 mEq/L (ref 96–112)
Creatinine, Ser: 1.83 mg/dL — ABNORMAL HIGH (ref 0.40–1.50)
GFR: 35.48 mL/min — ABNORMAL LOW (ref >60.00)
Glucose, Bld: 107 mg/dL — ABNORMAL HIGH (ref 70–99)
Potassium: 4.1 mEq/L (ref 3.5–5.1)
Sodium: 138 mEq/L (ref 135–145)
Total Bilirubin: 0.7 mg/dL (ref 0.2–1.2)
Total Protein: 6.2 g/dL (ref 6.0–8.3)

## 2021-07-17 LAB — LIPID PANEL
Cholesterol: 150 mg/dL (ref 0–200)
HDL: 35.6 mg/dL — ABNORMAL LOW (ref >39.00)
LDL Cholesterol: 82 mg/dL (ref 0–99)
NonHDL: 114.58
Total CHOL/HDL Ratio: 4
Triglycerides: 162 mg/dL — ABNORMAL HIGH (ref 0.0–149.0)
VLDL: 32.4 mg/dL (ref 0.0–40.0)

## 2021-07-17 LAB — HEMOGLOBIN A1C: Hgb A1c MFr Bld: 5.7 % (ref 4.6–6.5)

## 2021-07-17 LAB — TSH: TSH: 3.63 u[IU]/mL (ref 0.35–5.50)

## 2021-07-17 LAB — URIC ACID: Uric Acid, Serum: 8.5 mg/dL — ABNORMAL HIGH (ref 4.0–7.8)

## 2021-07-17 LAB — PSA: PSA: 4.01 ng/mL — ABNORMAL HIGH (ref 0.10–4.00)

## 2021-07-21 ENCOUNTER — Ambulatory Visit: Payer: Medicare Other | Admitting: Physical Therapy

## 2021-07-21 ENCOUNTER — Encounter: Payer: Self-pay | Admitting: Physical Therapy

## 2021-07-21 DIAGNOSIS — M6281 Muscle weakness (generalized): Secondary | ICD-10-CM

## 2021-07-21 DIAGNOSIS — M5459 Other low back pain: Secondary | ICD-10-CM

## 2021-07-21 DIAGNOSIS — M533 Sacrococcygeal disorders, not elsewhere classified: Secondary | ICD-10-CM | POA: Diagnosis not present

## 2021-07-21 DIAGNOSIS — G8929 Other chronic pain: Secondary | ICD-10-CM

## 2021-07-21 DIAGNOSIS — M25561 Pain in right knee: Secondary | ICD-10-CM | POA: Diagnosis not present

## 2021-07-21 NOTE — Progress Notes (Unsigned)
Subjective:    Patient ID: David [name], male    DOB: 23-Nov-1945, 76 y.o.   MRN: [id number]  No chief complaint on file.   HPI Patient is in today for a follow up.  Past Medical History:  Diagnosis Date   Adenoma of right adrenal gland 07/11/2002   2.4cm , noted on CT ABD   Allergy    Anxiety    Arthritis of both knees 03/26/2016   Astigmatism    Back pain    BCC (basal cell carcinoma of skin)    under right eye and right ear   Blood transfusion without reported diagnosis    Cataract 09/29/2016   Depression    Diverticulitis 2009   Diverticulosis    Family history of breast cancer    Family history of colon cancer    Fatty liver    GERD (gastroesophageal reflux disease)    Gout    Hearing loss of both ears 03/26/2016   History of atrial fibrillation    x 2 none since 2020   History of chronic prostatitis    started at age 18   History of kidney stones    Hyperglycemia    Hyperlipidemia    Hypertension    Incomplete right bundle branch block (RBBB)    Internal hemorrhoids    Kidney lesion 06/07/2015   Right midportion, 1.1x1.1 cm hyperechoic, noted on [place] ABD   LAFB (left anterior fascicular block)    Lipoma of axilla 09/2016   Liver lesion, right lobe 06/07/2015   2.5x2.4x2.4 cm hypoechoic lesion posterior aspect, noted on [place] ABD   Low back pain 03/26/2016   LVH (left ventricular hypertrophy) 08/06/2017   Moderate, Noted on ECHO   Medicare annual wellness visit, subsequent 03/12/2014   OA (osteoarthritis)    Back, Hands, Neck   Obesity 11/25/2007   Qualifier: Diagnosis of  By: Lenna Gilford MD, Deborra Medina    Other malaise and fatigue 03/13/2013   Premature ventricular complex    Prostate cancer ([place]) dx 2021   Renal insufficiency    Kidney removed right   Scoliosis    Upper thoracic and lumbar   Sinus bradycardia 08/06/2017   Noted on EKG   Vitamin D deficiency    resolved    Past Surgical History:  Procedure Laterality Date   CARDIOVERSION  07/31/2017    CATARACT EXTRACTION, BILATERAL     COLON SURGERY  2009   segmental sigmoid resection   COLONOSCOPY     CYSTOSCOPY N/A 07/28/2019   Procedure: CYSTOSCOPY FLEXIBLE;  Surgeon: Cleon Gustin, MD;  Location: [facility];  Service: Urology;  Laterality: N/A;   CYSTOSCOPY W/ RETROGRADES Right 06/07/2018   Procedure: CYSTOSCOPY WITH RETROGRADE PYELOGRAM, uphrostogram;  Surgeon: Cleon Gustin, MD;  Location: [facility];  Service: Urology;  Laterality: Right;   CYSTOSCOPY WITH URETEROSCOPY AND STENT PLACEMENT Right 02/28/2018   Procedure: CYSTOSCOPY WITH URETEROSCOPY [place];  Surgeon: Kathie Rhodes, MD;  Location[facility];  Service: Urology;  Laterality: Right;   CYSTOSCOPY/URETEROSCOPY/HOLMIUM LASER/STENT PLACEMENT Right 11/29/2017   Procedure: CYSTOSCOPY/RETROGRADE/URETEROSCOPY/HOLMIUM LASER/STENT PLACEMENT;  Surgeon: Kathie Rhodes, MD;  Location: [facility];  Service: Urology;  Laterality: Right;   EYE SURGERY Bilateral 01/12/2017   cataract removal   history of blood tranfusion  age 71   HOLMIUM LASER APPLICATION Right 2/59/5638   Procedure: HOLMIUM LASER APPLICATION;  Surgeon: Kathie Rhodes, MD;  Location: [facility];  Service: Urology;  Laterality: Right;   IR  BALLOON DILATION URETERAL STRICTURE RIGHT  03/14/2018   IR NEPHRO TUBE REMOV/FL  03/17/2018   IR NEPHROSTOGRAM RIGHT THRU EXISTING ACCESS  03/17/2018   IR NEPHROSTOMY EXCHANGE RIGHT  03/14/2018   IR NEPHROSTOMY EXCHANGE RIGHT  06/21/2018   IR NEPHROSTOMY PLACEMENT RIGHT  03/02/2018   IR NEPHROSTOMY PLACEMENT RIGHT  04/20/2018   IR URETERAL STENT PLACEMENT EXISTING ACCESS RIGHT  03/14/2018   NOSE SURGERY     Submucous resection age 88   NUCLEAR STRESS TEST  06/03/2009   RADIOACTIVE SEED IMPLANT N/A 07/28/2019   Procedure: RADIOACTIVE SEED IMPLANT/BRACHYTHERAPY IMPLANT;  Surgeon: Cleon Gustin, MD;  Location: [facility];  Service: Urology;  Laterality: N/A;  90 MINS    ROBOT ASSISTED LAPAROSCOPIC NEPHRECTOMY Right 08/04/2018   Procedure: XI ROBOTIC ASSISTED LAPAROSCOPIC NEPHRECTOMY;  Surgeon: Cleon Gustin, MD;  Location: [facility];  Service: Urology;  Laterality: Right;  3 HRS   ROBOT ASSISTED PYELOPLASTY Right 06/07/2018   Procedure: attempted XI ROBOTIC ASSISTED PYELOPLASTY, lysis of adhesions;  Surgeon: Cleon Gustin, MD;  Location: [facility];  Service: Urology;  Laterality: Right;  3 HRS   SKIN BIOPSY     SPACE OAR INSTILLATION N/A 07/28/2019   Procedure: SPACE OAR INSTILLATION;  Surgeon: Cleon Gustin, MD;  Location: [facility];  Service: Urology;  Laterality: N/A;   URETEROSCOPY WITH HOLMIUM LASER LITHOTRIPSY Right 09/24/2017   Procedure: CYSTOSCOPY, URETEROSCOPY WITH HOLMIUM LASER LITHOTRIPSY, STENT PLACEMENT;  Surgeon: Kathie Rhodes, MD;  Location: [facility];  Service: Urology;  Laterality: Right;    Family History  Problem Relation Age of Onset   Heart disease Mother    Hypertension Mother    Stroke Mother    Colon cancer Mother 30   Breast cancer Mother 65   Heart disease Father        pacemaker   Aortic aneurysm Father    Hypertension Father    Lung cancer Father 67   Heart disease Sister    Atrial fibrillation Sister    Obesity Sister    Sleep apnea Sister    Heart attack Brother    Other Brother        muscle disease   Arthritis Brother    Stroke Brother    Atrial fibrillation Brother    Heart attack Brother    Atrial fibrillation Brother    Diabetes Brother    Atrial fibrillation Brother    Heart attack Brother    Hypertension Brother    Hyperlipidemia Brother    Heart attack Brother    Other Brother        heart valve operation   Atrial fibrillation Brother    Heart attack Maternal Grandmother    Diabetes Maternal Grandmother    Cancer Maternal Grandfather 21       hodgin's lymphoma   Heart attack Paternal Grandmother    Anxiety disorder Paternal Grandmother    Pneumonia  Paternal Grandfather    Liver cancer Nephew 2       great nephew; d. 13   Lymphoma Niece 40   Esophageal cancer Neg Hx    Rectal cancer Neg Hx    Stomach cancer Neg Hx     Social History   Socioeconomic History   Marital status: Married    Spouse name: Not on file   Number of children: 3   Years of education: Not on file   Highest education level: Not on file  Occupational History  Occupation: Nurse, adult: [employer]  Tobacco Use   Smoking status: Former    Packs/day: 1.00    Years: 6.00    Total pack years: 6.00    Types: Cigarettes    Start date: 01/11/1969    Quit date: 01/12/1969    Years since quitting: 52.5   Smokeless tobacco: Never   Tobacco comments:    1965-1971  Vaping Use   Vaping Use: Never used  Substance and Sexual Activity   Alcohol use: Yes    Comment: occ   Drug use: Never   Sexual activity: Yes  Other Topics Concern   Not on file  Social History Narrative   The patient is married for the second time, he has 3 sons.   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   2 alcoholic beverages most days.   1 caffeinated beverage daily   No drug use no current tobacco use he is a prior smoker   12/24/2016   Social Determinants of Health   Financial Resource Strain: Low Risk  (02/20/2021)   Overall Financial Resource Strain (CARDIA)    Difficulty of Paying Living Expenses: Not hard at all  Food Insecurity: No Food Insecurity (02/20/2021)   Hunger Vital Sign    Worried About Running Out of Food in the Last Year: Never true    Ran Out of Food in the Last Year: Never true  Transportation Needs: No Transportation Needs (02/20/2021)   PRAPARE - Hydrologist (Medical): No    Lack of Transportation (Non-Medical): No  Physical Activity: Insufficiently Active (02/20/2021)   Exercise Vital Sign    Days of Exercise per Week: 5 days    Minutes of Exercise per Session: 20 min  Stress: Stress Concern Present (02/20/2021)   [place]    Feeling of Stress : To some extent  Social Connections: Moderately Isolated (02/20/2021)   Social Connection and Isolation Panel [NHANES]    Frequency of Communication with Friends and Family: More than three times a week    Frequency of Social Gatherings with Friends and Family: Twice a week    Attends Religious Services: Never    Marine scientist or Organizations: No    Attends Archivist Meetings: Never    Marital Status: Married  Human resources officer Violence: Not At Risk (02/20/2021)   Humiliation, Afraid, Rape, and Kick questionnaire    Fear of Current or Ex-Partner: No    Emotionally Abused: No    Physically Abused: No    Sexually Abused: No    Outpatient Medications Prior to Visit  Medication Sig Dispense Refill   ascorbic acid (VITAMIN C) 1000 MG tablet Take by mouth.     atenolol (TENORMIN) 50 MG tablet TAKE ONE TABLET BY MOUTH TWICE A DAY 180 tablet 1   atorvastatin (LIPITOR) 10 MG tablet TAKE ONE TABLET BY MOUTH EVERY MORNING 90 tablet 1   clotrimazole-betamethasone (LOTRISONE) lotion Apply topically 2 (two) times daily. For 2 weeks. 30 mL 0   colchicine 0.6 MG tablet For gout flares, take 1 tab po once then repeat in 1 hour as needed if pain persists up to a total of 3 doses 15 tablet 0   ELIQUIS 5 MG TABS tablet TAKE ONE TABLET BY MOUTH TWICE A DAY 180 tablet 1   FLUoxetine (PROZAC) 10 MG tablet Take 1 tablet (10 mg total) by mouth 3 (three) times daily. [place]  tablet 1   fluticasone (FLONASE) 50 MCG/ACT nasal spray Place 2 sprays into both nostrils daily. 16 g 0   LORazepam (ATIVAN) 1 MG tablet TAKE ONE TABLET BY MOUTH TWICE A DAY AS NEEDED FOR ANXIETY OR SEDATION 180 tablet 1   NYAMYC powder Apply topically.     omeprazole (PRILOSEC) 20 MG capsule Take 20 mg by mouth daily as needed (acid reflux).      No facility-administered medications prior to visit.    Allergies  Allergen Reactions   Cefaclor  Hives   Cephalosporins Hives   Penicillins Rash    Mild maculopapular rash Has patient had a PCN reaction causing immediate rash, facial/tongue/throat swelling, SOB or lightheadedness with hypotension: No Has patient had a PCN reaction causing severe rash involving mucus membranes or skin necrosis: No Has patient had a PCN reaction that required hospitalization: No Has patient had a PCN reaction occurring within the last 10 years: No If all of the above answers are "NO", then may proceed with Cephalosporin use.     ROS     Objective:    Physical Exam  There were no vitals taken for this visit. Wt Readings from Last 3 Encounters:  05/27/21 254 lb 12.8 oz (115.6 kg)  03/11/21 250 lb (113.4 kg)  02/20/21 252 lb (114.3 kg)    Diabetic Foot Exam - Simple   No data filed    Lab Results  Component Value Date   WBC 6.2 07/16/2021   HGB 14.0 07/16/2021   HCT 42.0 07/16/2021   PLT 191.0 07/16/2021   GLUCOSE 107 (H) 07/16/2021   CHOL 150 07/16/2021   TRIG 162.0 (H) 07/16/2021   HDL 35.60 (L) 07/16/2021   LDLDIRECT 168.5 03/29/2006   LDLCALC 82 07/16/2021   ALT 23 07/16/2021   AST 20 07/16/2021   NA 138 07/16/2021   K 4.1 07/16/2021   CL 103 07/16/2021   CREATININE 1.83 (H) 07/16/2021   BUN 15 07/16/2021   CO2 28 07/16/2021   TSH 3.63 07/16/2021   PSA 4.01 (H) 07/16/2021   INR 1.0 07/25/2019   HGBA1C 5.7 07/16/2021    Lab Results  Component Value Date   TSH 3.63 07/16/2021   Lab Results  Component Value Date   WBC 6.2 07/16/2021   HGB 14.0 07/16/2021   HCT 42.0 07/16/2021   MCV 94.0 07/16/2021   PLT 191.0 07/16/2021   Lab Results  Component Value Date   NA 138 07/16/2021   K 4.1 07/16/2021   CO2 28 07/16/2021   GLUCOSE 107 (H) 07/16/2021   BUN 15 07/16/2021   CREATININE 1.83 (H) 07/16/2021   BILITOT 0.7 07/16/2021   ALKPHOS 77 07/16/2021   AST 20 07/16/2021   ALT 23 07/16/2021   PROT 6.2 07/16/2021   ALBUMIN 4.1 07/16/2021   CALCIUM 9.7 07/16/2021    ANIONGAP 7 03/11/2021   GFR 35.48 (L) 07/16/2021   Lab Results  Component Value Date   CHOL 150 07/16/2021   Lab Results  Component Value Date   HDL 35.60 (L) 07/16/2021   Lab Results  Component Value Date   LDLCALC 82 07/16/2021   Lab Results  Component Value Date   TRIG 162.0 (H) 07/16/2021   Lab Results  Component Value Date   CHOLHDL 4 07/16/2021   Lab Results  Component Value Date   HGBA1C 5.7 07/16/2021       Assessment & Plan:      Problem List Items Addressed This Visit   None  I am having David [name] "Fred" maintain his omeprazole, ascorbic acid, colchicine, atorvastatin, Nyamyc, clotrimazole-betamethasone, FLUoxetine, Eliquis, fluticasone, atenolol, and LORazepam.  No orders of the defined types were placed in this encounter.

## 2021-07-21 NOTE — Therapy (Signed)
OUTPATIENT PHYSICAL THERAPY TREATMENT   Patient Name: David [name] MRN: [id number] DOB:05-Sep-1945, 76 y.o., male Today's Date: 07/21/2021   PT End of Session - 07/21/21 1445     Visit Number 6    Number of Visits 12    Date for PT Re-Evaluation 07/30/21    PT Start Time 3532    PT Stop Time 9924    PT Time Calculation (min) 45 min    Activity Tolerance Patient tolerated treatment well    Behavior During Therapy [facility] for tasks assessed/performed               Past Medical History:  Diagnosis Date   Adenoma of right adrenal gland 07/11/2002   2.4cm , noted on CT ABD   Allergy    Anxiety    Arthritis of both knees 03/26/2016   Astigmatism    Back pain    BCC (basal cell carcinoma of skin)    under right eye and right ear   Blood transfusion without reported diagnosis    Cataract 09/29/2016   Depression    Diverticulitis 2009   Diverticulosis    Family history of breast cancer    Family history of colon cancer    Fatty liver    GERD (gastroesophageal reflux disease)    Gout    Hearing loss of both ears 03/26/2016   History of atrial fibrillation    x 2 none since 2020   History of chronic prostatitis    started at age 72   History of kidney stones    Hyperglycemia    Hyperlipidemia    Hypertension    Incomplete right bundle branch block (RBBB)    Internal hemorrhoids    Kidney lesion 06/07/2015   Right midportion, 1.1x1.1 cm hyperechoic, noted on [place] ABD   LAFB (left anterior fascicular block)    Lipoma of axilla 09/2016   Liver lesion, right lobe 06/07/2015   2.5x2.4x2.4 cm hypoechoic lesion posterior aspect, noted on [place] ABD   Low back pain 03/26/2016   LVH (left ventricular hypertrophy) 08/06/2017   Moderate, Noted on ECHO   Medicare annual wellness visit, subsequent 03/12/2014   OA (osteoarthritis)    Back, Hands, Neck   Obesity 11/25/2007   Qualifier: Diagnosis of  By: Lenna Gilford MD, Deborra Medina    Other malaise and fatigue 03/13/2013   Premature  ventricular complex    Prostate cancer ([place]) dx 2021   Renal insufficiency    Kidney removed right   Scoliosis    Upper thoracic and lumbar   Sinus bradycardia 08/06/2017   Noted on EKG   Vitamin D deficiency    resolved   Past Surgical History:  Procedure Laterality Date   CARDIOVERSION  07/31/2017   CATARACT EXTRACTION, BILATERAL     COLON SURGERY  2009   segmental sigmoid resection   COLONOSCOPY     CYSTOSCOPY N/A 07/28/2019   Procedure: CYSTOSCOPY FLEXIBLE;  Surgeon: Cleon Gustin, MD;  Location: [facility];  Service: Urology;  Laterality: N/A;   CYSTOSCOPY W/ RETROGRADES Right 06/07/2018   Procedure: CYSTOSCOPY WITH RETROGRADE PYELOGRAM, uphrostogram;  Surgeon: Cleon Gustin, MD;  Location: [facility];  Service: Urology;  Laterality: Right;   CYSTOSCOPY WITH URETEROSCOPY AND STENT PLACEMENT Right 02/28/2018   Procedure: CYSTOSCOPY WITH URETEROSCOPY [place];  Surgeon: Kathie Rhodes, MD;  Location[facility];  Service: Urology;  Laterality: Right;   CYSTOSCOPY/URETEROSCOPY/HOLMIUM LASER/STENT PLACEMENT Right 11/29/2017   Procedure: CYSTOSCOPY/RETROGRADE/URETEROSCOPY/HOLMIUM LASER/STENT PLACEMENT;  Surgeon: Kathie Rhodes, MD;  Location: [facility];  Service: Urology;  Laterality: Right;   EYE SURGERY Bilateral 01/12/2017   cataract removal   history of blood tranfusion  age 7   HOLMIUM LASER APPLICATION Right 08/09/2058   Procedure: HOLMIUM LASER APPLICATION;  Surgeon: Kathie Rhodes, MD;  Location: [facility];  Service: Urology;  Laterality: Right;   IR BALLOON DILATION URETERAL STRICTURE RIGHT  03/14/2018   IR NEPHRO TUBE REMOV/FL  03/17/2018   IR NEPHROSTOGRAM RIGHT THRU EXISTING ACCESS  03/17/2018   IR NEPHROSTOMY EXCHANGE RIGHT  03/14/2018   IR NEPHROSTOMY EXCHANGE RIGHT  06/21/2018   IR NEPHROSTOMY PLACEMENT RIGHT  03/02/2018   IR NEPHROSTOMY PLACEMENT RIGHT  04/20/2018   IR URETERAL STENT PLACEMENT EXISTING ACCESS RIGHT  03/14/2018    NOSE SURGERY     Submucous resection age 50   NUCLEAR STRESS TEST  06/03/2009   RADIOACTIVE SEED IMPLANT N/A 07/28/2019   Procedure: RADIOACTIVE SEED IMPLANT/BRACHYTHERAPY IMPLANT;  Surgeon: Cleon Gustin, MD;  Location: [facility];  Service: Urology;  Laterality: N/A;  90 MINS   ROBOT ASSISTED LAPAROSCOPIC NEPHRECTOMY Right 08/04/2018   Procedure: XI ROBOTIC ASSISTED LAPAROSCOPIC NEPHRECTOMY;  Surgeon: Cleon Gustin, MD;  Location: [facility];  Service: Urology;  Laterality: Right;  3 HRS   ROBOT ASSISTED PYELOPLASTY Right 06/07/2018   Procedure: attempted XI ROBOTIC ASSISTED PYELOPLASTY, lysis of adhesions;  Surgeon: Cleon Gustin, MD;  Location: [facility];  Service: Urology;  Laterality: Right;  3 HRS   SKIN BIOPSY     SPACE OAR INSTILLATION N/A 07/28/2019   Procedure: SPACE OAR INSTILLATION;  Surgeon: Cleon Gustin, MD;  Location: [facility];  Service: Urology;  Laterality: N/A;   URETEROSCOPY WITH HOLMIUM LASER LITHOTRIPSY Right 09/24/2017   Procedure: CYSTOSCOPY, URETEROSCOPY WITH HOLMIUM LASER LITHOTRIPSY, STENT PLACEMENT;  Surgeon: Kathie Rhodes, MD;  Location: [facility];  Service: Urology;  Laterality: Right;   Patient Active Problem List   Diagnosis Date Noted   Coronary atherosclerosis 02/11/2021   Myalgia 11/04/2020   Neck pain 11/04/2020   Stage 3a chronic kidney disease ([place]) 07/02/2020   Urinary frequency 02/12/2020   Genetic testing 09/26/2019   Family history of breast cancer    Family history of colon cancer    Educated about COVID-19 virus infection 08/26/2019   Malignant neoplasm of prostate ([place]) 04/28/2019   Pruritus 02/03/2019   LVH (left ventricular hypertrophy) 10/30/2018   Tinea versicolor 09/28/2018   Thoracic back pain 09/26/2018   Hypercalcemia 08/08/2018   Nonfunctioning kidney 08/04/2018   Arm pain 07/18/2018   Right hip pain 06/22/2018   Ureteral obstruction 06/07/2018   Recurrent  nephrolithiasis 04/23/2018   Hydronephrosis 03/01/2018   Floppy eyelid syndrome of both eyes 01/24/2018   Foot pain, bilateral 12/28/2017   Kidney stone 09/18/2017   Insomnia 09/17/2017   Abnormal TSH 04/15/2017   Senile nuclear sclerosis 01/18/2017   Cataract 09/29/2016   Muscle cramp 09/29/2016   Nocturia 09/29/2016   Hyperglycemia 03/29/2016   Low back pain 03/26/2016   Cataracts, bilateral 03/26/2016   Arthritis of both knees 03/26/2016   Hearing loss 03/26/2016   Complex renal cyst 06/16/2015   History of atrial fibrillation 06/16/2015   Snoring 06/07/2015   Somnolence 06/07/2015   Preventative health care 03/07/2015   Lipoma of axilla 06/04/2014   Tinea cruris 15/61/5379   Lichen simplex chronicus 03/20/2014   Medicare annual wellness visit, subsequent 03/12/2014   Depression with anxiety  09/25/2013   Constipation 09/25/2013   Gouty arthritis of toe of left foot 08/01/2013   Skin lesion 05/27/2013   Rash 05/26/2013   Abnormal liver function 03/18/2013   IBS (irritable bowel syndrome) 03/18/2013   Other malaise and fatigue 03/13/2013   Tinea corporis 03/13/2013   Gout 12/14/2012   Diarrhea 12/14/2012   Rectal irritation 09/18/2012   Diverticulosis    BCC (basal cell carcinoma of skin)    Scoliosis 01/19/2011   Hyperlipidemia, mixed 03/11/2010   Essential hypertension, benign 03/11/2010   ALLERGIC RHINITIS 03/11/2010   Arthralgia 03/11/2010   Obesity 11/25/2007   BENIGN NEOPLASM OF ADRENAL GLAND 09/22/2007   Fatty liver disease, nonalcoholic 12/45/8099   Osteoarthritis 09/22/2007   SPONDYLOSIS, LUMBAR 09/22/2007   GERD 04/12/2007    PCP: Mosie Lukes MD  REFERRING PROVIDER: Terrilyn Saver, NP  REFERRING DIAG: [phone] (ICD-10-CM) - Chronic pain of right knee M25.511,G89.29,M25.512 (ICD-10-CM) - Chronic pain of both shoulders M25.551 (ICD-10-CM) - Right hip pain   Rationale for Evaluation and Treatment Rehabilitation  THERAPY DIAG:  Other  low back pain  SI (sacroiliac) pain  Chronic pain of right knee  Muscle weakness (generalized)  ONSET DATE: chronic, worsened over several years  SUBJECTIVE:                                                                                                                                                                                           SUBJECTIVE STATEMENT: Pt reports feeling pretty good, L knee is a good deal better - he was out, twisted it and it clicked, and hasn't had pain since.  Back is stiff, did a lot of bend overs.   PERTINENT HISTORY:  Chronic kidney disease, gout, history of R kidney surgery, abdominal surgeries, prostate and skin cancer, obesity, HTN, chronic bil shoulder pain, chronic LBP, neck pain.   PAIN:  Are you having pain? Yes: NPRS scale: 4/10 Pain location: back - stiff   PRECAUTIONS: None  WEIGHT BEARING RESTRICTIONS No  FALLS:  Has patient fallen in last 6 months? No  LIVING ENVIRONMENT: Lives with: lives with their spouse Lives in: House/apartment Stairs: Yes: Internal: 12 steps; can reach both to office upstairs Has following equipment at home: Single point cane uses occasionally due to gout  OCCUPATION: retired Optometrist  PLOF: Independent  PATIENT GOALS loosen up a little, get back to walking   OBJECTIVE:   DIAGNOSTIC FINDINGS:  05/27/2021  Xrays: Hip Bilateral hip joint degenerative changes. Left Shoulder L Mild degenerative changes of the glenohumeral joint. R shoulder Mild AC and glenohumeral joint degenerative changes. R knee Chondrocalcinosis. Mild patellofemoral joint degenerative  changes. Mild medial compartment joint degenerative changes.  PATIENT SURVEYS:  LEFS 46%  COGNITION:  Overall cognitive status: Within functional limits for tasks assessed     SENSATION: Not tested  MUSCLE LENGTH: Hamstrings: mild tightness Quadriceps: mild tightness bil  POSTURE: rounded shoulders and forward  head  PALPATION: Tenderness R SIJ, R piriformis, R glute medius.  Decreased mobility lumbar spine with PA mobs, reports tenderness L3-5.  Tenderness R quadriceps tendon.   NO tenderness R hip trochanter.   LUMBAR ROM:   Active  AROM  eval  Flexion To toes  Extension 75%, inc pain midline LB  Right lateral flexion To knee  Left lateral flexion To knee  Right rotation 75%, Inc pain LB  Left rotation 75%, Inc pain LB   (Blank rows = not tested)  LOWER EXTREMITY ROM:   Hip ROM WNL and symmetric bil, no pain.     LOWER EXTREMITY MMT:    MMT Right eval Left eval  Hip flexion 4 4  Hip extension 4 4  Hip abduction 5 5  Hip adduction 5 5  Knee flexion 4 4  Knee extension 5 5  Ankle dorsiflexion 4 4   (Blank rows = not tested)  LUMBAR SPECIAL TESTS:  Straight leg raise test: Negative and FABER test: Negative  KNEE & HIP SPECIAL TESTS: R knee ligaments intact, negative Ober's Test R  FUNCTIONAL TESTS:  5 times sit to stand: 13.6 seconds without UE assist   TODAY'S TREATMENT  07/21/2021 Therapeutic Exercise: to improve strength and mobility.  Demo, verbal and tactile cues throughout for technique. Bike L1x 6 min seat # 7 At counter for safety with SBA Heel/toe raises x 20 Hip abduction x 15 bil  Hip extension x 15 bil  Knee flexion x 10 bil  Hip flexion x 15 bil   Forward lunge with raised arm x 10 bil - 1 hand on counter for safety- noted increased dyspnea on exertion with arm raises In supine (head elevated) SLR x 15 bil  LTR x 10  Windshield wipers x 20  Prone knee bends x 10 bil  Prone leg extensions x 10 bil  Manual Therapy: to decrease muscle spasm and pain and improve mobility   IASTM with foam roller to bil glutes/proximal hamstrings and QL; UPA mobs to lumbar spine, sacral mobs.   07/16/2021 Therapeutic Exercise: to improve strength and mobility.  Demo, verbal and tactile cues throughout for technique. Nustep L5 x 6 min  At counter for safety with  SBA Heel/toe raises x 10 Hip abduction x 10 bil  Hip extension x 10 bil  Hip flexion x 10 bil  In supine (head elevated due to shortness of breath)   SLR 2 x 10 bil  Bridges 2 x 10 Marches with TrA contraction 2 x 20  Manual Therapy: to decrease muscle spasm and pain and improve mobility   IASTM with foam roller to bil glutes/proximal hamstrings and QL  07/02/21 Therapeutic Exercise: to improve strength and mobility.  Demo, verbal and tactile cues throughout for technique. Nustep L5 x 6 min  At counter for safety Heel/toe raises x 10 Hip abduction x 10 bil  Hip extension x 10 bil  Hip flexion x 10 bil  Sit to stands 2 x 10 seated rest break between Bridges 2 x 10 KTOS piriformis stretch 3 x 15 sec bil - needed HOH assist and VC throughout.  Supine leg raises x 10 bil  Manual Therapy: to decrease muscle spasm and  pain and improve mobility   IASTM with foam roller to bil glutes/proximal hamstrings and QL   PATIENT EDUCATION:  Education details: reminded to get new shoes, current shoes are tripping hazard due to loose sole.   Person educated: Patient Education method: Explanation Education comprehension: verbalized understanding   HOME EXERCISE PROGRAM: Access Code: [email or web address]    ASSESSMENT:  CLINICAL IMPRESSION: Pt reports L knee pain resolved, and overall feels less pain and improved strength.  He is performing HEP meeting STG #1.  He still demonstrates decreased tolerance to exercise and dyspnea on exertion, and weakness in bil glutes.  Continued to progress exercises to tolerance, followed by manual therapy focusing on bil hips (reported more L hip pain and weakness following exercise) and sacral mobs today. [name] continues to demonstrate potential for improvement and would benefit from continued skilled therapy to address impairments.     OBJECTIVE IMPAIRMENTS decreased activity tolerance, decreased endurance, decreased mobility, difficulty walking, decreased  ROM, decreased strength, hypomobility, impaired perceived functional ability, increased muscle spasms, impaired flexibility, postural dysfunction, obesity, and pain.   ACTIVITY LIMITATIONS carrying, lifting, bending, sleeping, and locomotion level  PARTICIPATION LIMITATIONS: cleaning, shopping, community activity, and yard work  PERSONAL FACTORS Age, Behavior pattern, Past/current experiences, Time since onset of injury/illness/exacerbation, and 3+ comorbidities: Chronic kidney disease, gout, history of R kidney surgery, abdominal surgeries, prostate and skin cancer, obesity, HTN, chronic bil shoulder pain, chronic LBP, neck pain.  are also affecting patient's functional outcome.   REHAB POTENTIAL: Good  CLINICAL DECISION MAKING: Evolving/moderate complexity  EVALUATION COMPLEXITY: Moderate   GOALS: Goals reviewed with patient? Yes  SHORT TERM GOALS: Target date: 07/02/2021   Patient will be independent with initial HEP.  Baseline: given Goal status: MET 07/21/21 reports good compliance   LONG TERM GOALS: Target date: 07/30/2021    Patient will be independent with advanced/ongoing HEP to improve outcomes and carryover.  Baseline: needs advancement Goal status: IN PROGRESS  2.  Patient will report 75% improvement in R hip/ low back pain to improve QOL.  Baseline: 8-9/10 R hip, LB 6-7/10 Goal status: IN PROGRESS  3.  Patient will demonstrate 4+/5 bil LE strength throughout to decrease muscle imbalance. Baseline: see objective.  Also weakness in core.  Goal status: IN PROGRESS  4.  Patient will report 58% on LEFS to demonstrate improved functional ability.  Baseline: 46% Goal status: IN PROGRESS   5.  Patient will tolerate 30 min of walking without increased R hip/knee pain in order to participate in regular exercise. Baseline: unable Goal status: IN PROGRESS  8.  Patient will be able to transition to community based exercise program to maintain gains.  Baseline:  unable Goal status: IN PROGRESS    PLAN: PT FREQUENCY: 1-2x/week  PT DURATION: 6 weeks  PLANNED INTERVENTIONS: Therapeutic exercises, Therapeutic activity, Neuromuscular re-education, Balance training, Gait training, Patient/Family education, Joint mobilization, Stair training, Dry Needling, Electrical stimulation, Spinal mobilization, Cryotherapy, Moist heat, Traction, Ultrasound, Ionotophoresis 4mg /ml Dexamethasone, and Manual therapy.  PLAN FOR NEXT SESSION: review body mechanics, add forward T's to HEP, continue LE strengthening, whole body movements.  Manual therapy/modalities PRN,    Rennie Natter, PT, DPT  07/21/2021, 3:39 PM

## 2021-07-22 ENCOUNTER — Encounter: Payer: Self-pay | Admitting: Family Medicine

## 2021-07-22 ENCOUNTER — Ambulatory Visit (INDEPENDENT_AMBULATORY_CARE_PROVIDER_SITE_OTHER): Payer: Medicare Other | Admitting: Family Medicine

## 2021-07-22 VITALS — BP 138/70 | HR 56 | Resp 20 | Ht 72.0 in | Wt 260.4 lb

## 2021-07-22 DIAGNOSIS — L578 Other skin changes due to chronic exposure to nonionizing radiation: Secondary | ICD-10-CM | POA: Diagnosis not present

## 2021-07-22 DIAGNOSIS — Z8546 Personal history of malignant neoplasm of prostate: Secondary | ICD-10-CM

## 2021-07-22 DIAGNOSIS — N1831 Chronic kidney disease, stage 3a: Secondary | ICD-10-CM | POA: Diagnosis not present

## 2021-07-22 DIAGNOSIS — I10 Essential (primary) hypertension: Secondary | ICD-10-CM | POA: Diagnosis not present

## 2021-07-22 DIAGNOSIS — E6609 Other obesity due to excess calories: Secondary | ICD-10-CM | POA: Diagnosis not present

## 2021-07-22 DIAGNOSIS — M25562 Pain in left knee: Secondary | ICD-10-CM | POA: Diagnosis not present

## 2021-07-22 DIAGNOSIS — R739 Hyperglycemia, unspecified: Secondary | ICD-10-CM

## 2021-07-22 DIAGNOSIS — F418 Other specified anxiety disorders: Secondary | ICD-10-CM

## 2021-07-22 DIAGNOSIS — E782 Mixed hyperlipidemia: Secondary | ICD-10-CM

## 2021-07-22 MED ORDER — FLUCONAZOLE 150 MG PO TABS: 150.0000 mg | ORAL_TABLET | ORAL | 1 refills | Status: DC

## 2021-07-22 NOTE — Patient Instructions (Signed)
Meniscus Tear  A meniscus tear is a knee injury that happens when a piece of the meniscus is torn. The meniscus is a thick, rubbery, wedge-shaped piece of cartilage in the knee. Each knee has two menisci sitting between the upper bone (femur) and lower bone (tibia) that form the knee joint. Each meniscus acts as a shock absorber for the knee. A torn meniscus is a common knee injury, ranging from mild to severe. Surgery may be needed to repair a severe tear. What are the causes? This condition may be caused by kneeling, squatting, twisting, or pivoting movements. Sports-related injuries are the most common cause, often resulting from: Running and stopping suddenly. Changing direction. Being tackled or knocked off your feet. Lifting or carrying heavy weights. As people get older, their menisci get thinner and weaker. Tears can happen more easily in older people, for example, when climbing stairs. What increases the risk? You are more likely to develop this condition if you: Play contact sports. Have a job that requires kneeling or squatting. Are male. Are over 35 years old. What are the signs or symptoms? Symptoms of this condition include: Knee pain, especially at the side of the knee joint. You may feel pain immediately after injury, or hear a pop and feel pain later. A feeling that your knee is clicking, catching, locking, or giving way (weakness, instability). Not being able to fully bend or extend your knee. Bruising or swelling in your knee. How is this diagnosed? This condition may be diagnosed based on your symptoms and a physical exam. You may also have tests, such as: X-rays. MRI. Arthroscopy. This is a procedure to look inside your knee with a narrow surgical telescope. You may be referred to a knee specialist (orthopedic surgeon). How is this treated? Treatment for this injury depends on the severity of the tear. Treatment for a mild tear may include: Rest. Medicine to  reduce pain and swelling, usually a nonsteroidal anti-inflammatory drug (NSAID), like ibuprofen. A knee brace, sleeve, or wrap. Using crutches or a walker to keep weight off your knee and help with walking. Exercises to strengthen your knee (physical therapy). You may need surgery if you have a severe tear or if other treatments fail. Follow these instructions at home: If you have a brace, sleeve, or wrap: Wear it, as told by your health care provider. Remove it, only as told by your health care provider. Loosen the brace, sleeve, or wrap if your toes tingle, become numb, or turn cold and blue. Keep the brace, sleeve, or wrap clean. If the brace, sleeve, or wrap is not waterproof: Do not let it get wet. Cover it with a watertight covering when you take a bath or shower. Managing pain, stiffness, and swelling  Take over-the-counter and prescription medicines only as told by your health care provider. If directed, put ice on your knee. To do this: If you have a removable brace, sleeve, or wrap, remove it as told by your health care provider. Put ice in a plastic bag. Place a towel between your skin and the bag. Leave the ice on for 20 minutes, 2-3 times per day. Remove the ice if your skin turns bright red. This is very important. If you cannot feel pain, heat, or cold, you have a greater risk of damage to the area. Move your toes often to reduce stiffness and swelling. Raise (elevate) the injured area above the level of your heart while you are sitting or lying down. Activity Do not  use the injured limb to support your body weight until your health care provider says that you can. Use crutches or a walker as told by your health care provider. Return to your normal activities as told by your health care provider. Ask your health care provider what activities are safe for you. Perform range-of-motion exercises only as told by your health care provider. Begin doing exercises to strengthen  your knee and leg muscles only as told by your health care provider. After you recover, your health care provider may recommend these exercises to help prevent another injury. General instructions Use a knee brace, sleeve, or wrap as told by your health care provider. Ask your health care provider when it is safe to drive if you have a brace, sleeve, or wrap on your knee. Do not use any products that contain nicotine or tobacco, such as cigarettes, e-cigarettes, and chewing tobacco. If you need help quitting, ask your health care provider. Ask your health care provider if the medicine prescribed to you: Requires you to avoid driving or using heavy machinery. Can cause constipation. You may need to take these actions to prevent or treat constipation: Drink enough fluid to keep your urine pale yellow. Take over-the-counter or prescription medicines. Eat foods that are high in fiber, such as beans, whole grains, and fresh fruits and vegetables. Limit foods that are high in fat and processed sugars, such as fried or sweet foods. Keep all follow-up visits. This is important. Contact a health care provider if: You have a fever. Your knee becomes red, tender, or swollen. Your pain medicine is not controlling your pain. Your symptoms get worse or do not improve after 2 weeks of home care. Summary A meniscus tear is a knee injury that happens when a piece of the meniscus is torn. Treatment for this injury depends on the severity of the tear. You may need surgery if you have a severe tear or if other treatments fail. Rest, ice, and raise (elevate) your injured knee, as told by your health care provider. This will help lessen pain and swelling. Contact a health care provider if you have new symptoms, your symptoms worsen, or they do not improve after 2 weeks of home care. Keep all follow-up visits. This is important. This information is not intended to replace advice given to you by your health care  provider. Make sure you discuss any questions you have with your health care provider. Document Revised: 05/11/2019 Document Reviewed: 05/11/2019 Elsevier Patient Education  2023 [employer]  

## 2021-07-23 ENCOUNTER — Ambulatory Visit: Payer: Medicare Other

## 2021-07-23 DIAGNOSIS — M25561 Pain in right knee: Secondary | ICD-10-CM | POA: Diagnosis not present

## 2021-07-23 DIAGNOSIS — M533 Sacrococcygeal disorders, not elsewhere classified: Secondary | ICD-10-CM | POA: Diagnosis not present

## 2021-07-23 DIAGNOSIS — Z8546 Personal history of malignant neoplasm of prostate: Secondary | ICD-10-CM | POA: Insufficient documentation

## 2021-07-23 DIAGNOSIS — G8929 Other chronic pain: Secondary | ICD-10-CM | POA: Diagnosis not present

## 2021-07-23 DIAGNOSIS — M6281 Muscle weakness (generalized): Secondary | ICD-10-CM | POA: Diagnosis not present

## 2021-07-23 DIAGNOSIS — L578 Other skin changes due to chronic exposure to nonionizing radiation: Secondary | ICD-10-CM | POA: Insufficient documentation

## 2021-07-23 DIAGNOSIS — M5459 Other low back pain: Secondary | ICD-10-CM

## 2021-07-23 DIAGNOSIS — M25562 Pain in left knee: Secondary | ICD-10-CM | POA: Insufficient documentation

## 2021-07-23 NOTE — Assessment & Plan Note (Signed)
Pain is medial and intermittent. Certain movements bring it on and then it resolves. No fall or injury. He is encouraged to minimize sue for a short time. Apply ice and topical treatments twice daily and let us know if he worsens.

## 2021-07-23 NOTE — Assessment & Plan Note (Signed)
hgba1c acceptable, minimize simple carbs. Increase exercise as tolerated. Continue current meds 

## 2021-07-23 NOTE — Assessment & Plan Note (Signed)
Continues to struggle with stress at home secondary to his and his wife's poor health but he does not believe he needs medication adjustment at this time.

## 2021-07-23 NOTE — Assessment & Plan Note (Signed)
He notes lesions on fact trunks and legs. He has a dermatologist and the lesions mostly look like SKs. None look sinister. He will follow up with dermatology

## 2021-07-23 NOTE — Assessment & Plan Note (Signed)
Following with [facility] and has no new concerns.

## 2021-07-23 NOTE — Therapy (Signed)
OUTPATIENT PHYSICAL THERAPY TREATMENT   Patient Name: David [name] MRN: [id number] DOB:1945/05/26, 76 y.o., male Today's Date: 07/23/2021   PT End of Session - 07/23/21 1713     Visit Number 7    Number of Visits 12    Date for PT Re-Evaluation 07/30/21    PT Start Time 5643    PT Stop Time 1700    PT Time Calculation (min) 43 min    Activity Tolerance Patient tolerated treatment well    Behavior During Therapy [facility] for tasks assessed/performed                Past Medical History:  Diagnosis Date   Adenoma of right adrenal gland 07/11/2002   2.4cm , noted on CT ABD   Allergy    Anxiety    Arthritis of both knees 03/26/2016   Astigmatism    Back pain    BCC (basal cell carcinoma of skin)    under right eye and right ear   Blood transfusion without reported diagnosis    Cataract 09/29/2016   Depression    Diverticulitis 2009   Diverticulosis    Family history of breast cancer    Family history of colon cancer    Fatty liver    GERD (gastroesophageal reflux disease)    Gout    Hearing loss of both ears 03/26/2016   History of atrial fibrillation    x 2 none since 2020   History of chronic prostatitis    started at age 45   History of kidney stones    Hyperglycemia    Hyperlipidemia    Hypertension    Incomplete right bundle branch block (RBBB)    Internal hemorrhoids    Kidney lesion 06/07/2015   Right midportion, 1.1x1.1 cm hyperechoic, noted on [place] ABD   LAFB (left anterior fascicular block)    Lipoma of axilla 09/2016   Liver lesion, right lobe 06/07/2015   2.5x2.4x2.4 cm hypoechoic lesion posterior aspect, noted on [place] ABD   Low back pain 03/26/2016   LVH (left ventricular hypertrophy) 08/06/2017   Moderate, Noted on ECHO   Medicare annual wellness visit, subsequent 03/12/2014   OA (osteoarthritis)    Back, Hands, Neck   Obesity 11/25/2007   Qualifier: Diagnosis of  By: Lenna Gilford MD, Deborra Medina    Other malaise and fatigue 03/13/2013   Premature  ventricular complex    Prostate cancer ([place]) dx 2021   Renal insufficiency    Kidney removed right   Scoliosis    Upper thoracic and lumbar   Sinus bradycardia 08/06/2017   Noted on EKG   Vitamin D deficiency    resolved   Past Surgical History:  Procedure Laterality Date   CARDIOVERSION  07/31/2017   CATARACT EXTRACTION, BILATERAL     COLON SURGERY  2009   segmental sigmoid resection   COLONOSCOPY     CYSTOSCOPY N/A 07/28/2019   Procedure: CYSTOSCOPY FLEXIBLE;  Surgeon: Cleon Gustin, MD;  Location: [facility];  Service: Urology;  Laterality: N/A;   CYSTOSCOPY W/ RETROGRADES Right 06/07/2018   Procedure: CYSTOSCOPY WITH RETROGRADE PYELOGRAM, uphrostogram;  Surgeon: Cleon Gustin, MD;  Location: [facility];  Service: Urology;  Laterality: Right;   CYSTOSCOPY WITH URETEROSCOPY AND STENT PLACEMENT Right 02/28/2018   Procedure: CYSTOSCOPY WITH URETEROSCOPY [place];  Surgeon: Kathie Rhodes, MD;  Location: [facility];  Service: Urology;  Laterality: Right;   CYSTOSCOPY/URETEROSCOPY/HOLMIUM LASER/STENT PLACEMENT Right 11/29/2017   Procedure: CYSTOSCOPY/RETROGRADE/URETEROSCOPY/HOLMIUM LASER/STENT PLACEMENT;  Surgeon: Ihor Gully, MD;  Location: [facility];  Service: Urology;  Laterality: Right;   EYE SURGERY Bilateral 01/12/2017   cataract removal   history of blood tranfusion  age 25   HOLMIUM LASER APPLICATION Right 09/24/2017   Procedure: HOLMIUM LASER APPLICATION;  Surgeon: Ihor Gully, MD;  Location: [facility];  Service: Urology;  Laterality: Right;   IR BALLOON DILATION URETERAL STRICTURE RIGHT  03/14/2018   IR NEPHRO TUBE REMOV/FL  03/17/2018   IR NEPHROSTOGRAM RIGHT THRU EXISTING ACCESS  03/17/2018   IR NEPHROSTOMY EXCHANGE RIGHT  03/14/2018   IR NEPHROSTOMY EXCHANGE RIGHT  06/21/2018   IR NEPHROSTOMY PLACEMENT RIGHT  03/02/2018   IR NEPHROSTOMY PLACEMENT RIGHT  04/20/2018   IR URETERAL STENT PLACEMENT EXISTING ACCESS RIGHT  03/14/2018    NOSE SURGERY     Submucous resection age 7   NUCLEAR STRESS TEST  06/03/2009   RADIOACTIVE SEED IMPLANT N/A 07/28/2019   Procedure: RADIOACTIVE SEED IMPLANT/BRACHYTHERAPY IMPLANT;  Surgeon: Malen Gauze, MD;  Location: [facility];  Service: Urology;  Laterality: N/A;  90 MINS   ROBOT ASSISTED LAPAROSCOPIC NEPHRECTOMY Right 08/04/2018   Procedure: XI ROBOTIC ASSISTED LAPAROSCOPIC NEPHRECTOMY;  Surgeon: Malen Gauze, MD;  Location: [facility];  Service: Urology;  Laterality: Right;  3 HRS   ROBOT ASSISTED PYELOPLASTY Right 06/07/2018   Procedure: attempted XI ROBOTIC ASSISTED PYELOPLASTY, lysis of adhesions;  Surgeon: Malen Gauze, MD;  Location: [facility];  Service: Urology;  Laterality: Right;  3 HRS   SKIN BIOPSY     SPACE OAR INSTILLATION N/A 07/28/2019   Procedure: SPACE OAR INSTILLATION;  Surgeon: Malen Gauze, MD;  Location: [facility];  Service: Urology;  Laterality: N/A;   URETEROSCOPY WITH HOLMIUM LASER LITHOTRIPSY Right 09/24/2017   Procedure: CYSTOSCOPY, URETEROSCOPY WITH HOLMIUM LASER LITHOTRIPSY, STENT PLACEMENT;  Surgeon: Ihor Gully, MD;  Location: [facility];  Service: Urology;  Laterality: Right;   Patient Active Problem List   Diagnosis Date Noted   Sun-damaged skin 07/23/2021   H/O prostate cancer 07/23/2021   Left knee pain 07/23/2021   Coronary atherosclerosis 02/11/2021   Myalgia 11/04/2020   Neck pain 11/04/2020   Stage 3a chronic kidney disease (HCC) 07/02/2020   Urinary frequency 02/12/2020   Genetic testing 09/26/2019   Family history of breast cancer    Family history of colon cancer    Educated about COVID-19 virus infection 08/26/2019   Malignant neoplasm of prostate (HCC) 04/28/2019   Pruritus 02/03/2019   LVH (left ventricular hypertrophy) 10/30/2018   Tinea versicolor 09/28/2018   Thoracic back pain 09/26/2018   Hypercalcemia 08/08/2018   Nonfunctioning kidney 08/04/2018   Arm pain  07/18/2018   Right hip pain 06/22/2018   Ureteral obstruction 06/07/2018   Recurrent nephrolithiasis 04/23/2018   Hydronephrosis 03/01/2018   Floppy eyelid syndrome of both eyes 01/24/2018   Foot pain, bilateral 12/28/2017   Kidney stone 09/18/2017   Insomnia 09/17/2017   Abnormal TSH 04/15/2017   Senile nuclear sclerosis 01/18/2017   Cataract 09/29/2016   Muscle cramp 09/29/2016   Nocturia 09/29/2016   Hyperglycemia 03/29/2016   Low back pain 03/26/2016   Cataracts, bilateral 03/26/2016   Arthritis of both knees 03/26/2016   Hearing loss 03/26/2016   Complex renal cyst 06/16/2015   History of atrial fibrillation 06/16/2015   Snoring 06/07/2015   Somnolence 06/07/2015   Preventative health care 03/07/2015   Lipoma of axilla 06/04/2014   Tinea cruris 03/20/2014  Lichen simplex chronicus 03/20/2014   Medicare annual wellness visit, subsequent 03/12/2014   Depression with anxiety 09/25/2013   Constipation 09/25/2013   Gouty arthritis of toe of left foot 08/01/2013   Skin lesion 05/27/2013   Rash 05/26/2013   Abnormal liver function 03/18/2013   IBS (irritable bowel syndrome) 03/18/2013   Other malaise and fatigue 03/13/2013   Tinea corporis 03/13/2013   Gout 12/14/2012   Diarrhea 12/14/2012   Rectal irritation 09/18/2012   Diverticulosis    BCC (basal cell carcinoma of skin)    Scoliosis 01/19/2011   Hyperlipidemia, mixed 03/11/2010   Essential hypertension, benign 03/11/2010   ALLERGIC RHINITIS 03/11/2010   Arthralgia 03/11/2010   Obesity 11/25/2007   BENIGN NEOPLASM OF ADRENAL GLAND 09/22/2007   Fatty liver disease, nonalcoholic 11/08/2534   Osteoarthritis 09/22/2007   SPONDYLOSIS, LUMBAR 09/22/2007   GERD 04/12/2007    PCP: Mosie Lukes MD  REFERRING PROVIDER: Terrilyn Saver, NP  REFERRING DIAG: [phone] (ICD-10-CM) - Chronic pain of right knee M25.511,G89.29,M25.512 (ICD-10-CM) - Chronic pain of both shoulders M25.551 (ICD-10-CM) - Right hip  pain   Rationale for Evaluation and Treatment Rehabilitation  THERAPY DIAG:  Other low back pain  SI (sacroiliac) pain  Chronic pain of right knee  Muscle weakness (generalized)  ONSET DATE: chronic, worsened over several years  SUBJECTIVE:                                                                                                                                                                                           SUBJECTIVE STATEMENT: Last couple of sessions I've had soreness in the knees.  PERTINENT HISTORY:  Chronic kidney disease, gout, history of R kidney surgery, abdominal surgeries, prostate and skin cancer, obesity, HTN, chronic bil shoulder pain, chronic LBP, neck pain.   PAIN:  Are you having pain? Yes: NPRS scale: 4/10 Pain location: back - stiff   PRECAUTIONS: None  WEIGHT BEARING RESTRICTIONS No  FALLS:  Has patient fallen in last 6 months? No  LIVING ENVIRONMENT: Lives with: lives with their spouse Lives in: House/apartment Stairs: Yes: Internal: 12 steps; can reach both to office upstairs Has following equipment at home: Single point cane uses occasionally due to gout  OCCUPATION: retired Optometrist  PLOF: Independent  PATIENT GOALS loosen up a little, get back to walking   OBJECTIVE:   DIAGNOSTIC FINDINGS:  05/27/2021  Xrays: Hip Bilateral hip joint degenerative changes. Left Shoulder L Mild degenerative changes of the glenohumeral joint. R shoulder Mild AC and glenohumeral joint degenerative changes. R knee Chondrocalcinosis. Mild patellofemoral joint degenerative changes. Mild medial compartment joint degenerative changes.  PATIENT SURVEYS:  LEFS 46%  COGNITION:  Overall cognitive status: Within functional limits for tasks assessed     SENSATION: Not tested  MUSCLE LENGTH: Hamstrings: mild tightness Quadriceps: mild tightness bil  POSTURE: rounded shoulders and forward head  PALPATION: Tenderness R SIJ, R piriformis, R  glute medius.  Decreased mobility lumbar spine with PA mobs, reports tenderness L3-5.  Tenderness R quadriceps tendon.   NO tenderness R hip trochanter.   LUMBAR ROM:   Active  AROM  eval  Flexion To toes  Extension 75%, inc pain midline LB  Right lateral flexion To knee  Left lateral flexion To knee  Right rotation 75%, Inc pain LB  Left rotation 75%, Inc pain LB   (Blank rows = not tested)  LOWER EXTREMITY ROM:   Hip ROM WNL and symmetric bil, no pain.     LOWER EXTREMITY MMT:    MMT Right eval Left eval  Hip flexion 4 4  Hip extension 4 4  Hip abduction 5 5  Hip adduction 5 5  Knee flexion 4 4  Knee extension 5 5  Ankle dorsiflexion 4 4   (Blank rows = not tested)  LUMBAR SPECIAL TESTS:  Straight leg raise test: Negative and FABER test: Negative  KNEE & HIP SPECIAL TESTS: R knee ligaments intact, negative Ober's Test R  FUNCTIONAL TESTS:  5 times sit to stand: 13.6 seconds without UE assist   TODAY'S TREATMENT  07/23/21 Therapeutic Exercise: NuStep L6x17min Fwd lunge 10x bil Side lunge 10x bil Pallof press and trunk blue TB 10x bil Leg press 35# 2x10 Knee flexion 25# 2x10 Knee extension 15# 2x10  Manual Therapy: STM to R lumbar parapsinals, glutes  07/21/2021 Therapeutic Exercise: to improve strength and mobility.  Demo, verbal and tactile cues throughout for technique. Bike L1x 6 min seat # 7 At counter for safety with SBA Heel/toe raises x 20 Hip abduction x 15 bil  Hip extension x 15 bil  Knee flexion x 10 bil  Hip flexion x 15 bil   Forward lunge with raised arm x 10 bil - 1 hand on counter for safety- noted increased dyspnea on exertion with arm raises In supine (head elevated) SLR x 15 bil  LTR x 10  Windshield wipers x 20  Prone knee bends x 10 bil  Prone leg extensions x 10 bil  Manual Therapy: to decrease muscle spasm and pain and improve mobility   IASTM with foam roller to bil glutes/proximal hamstrings and QL; UPA mobs to lumbar  spine, sacral mobs.   07/16/2021 Therapeutic Exercise: to improve strength and mobility.  Demo, verbal and tactile cues throughout for technique. Nustep L5 x 6 min  At counter for safety with SBA Heel/toe raises x 10 Hip abduction x 10 bil  Hip extension x 10 bil  Hip flexion x 10 bil  In supine (head elevated due to shortness of breath)   SLR 2 x 10 bil  Bridges 2 x 10 Marches with TrA contraction 2 x 20  Manual Therapy: to decrease muscle spasm and pain and improve mobility   IASTM with foam roller to bil glutes/proximal hamstrings and QL  07/02/21 Therapeutic Exercise: to improve strength and mobility.  Demo, verbal and tactile cues throughout for technique. Nustep L5 x 6 min  At counter for safety Heel/toe raises x 10 Hip abduction x 10 bil  Hip extension x 10 bil  Hip flexion x 10 bil  Sit to stands 2 x 10 seated rest break between [place] 2  x 10 KTOS piriformis stretch 3 x 15 sec bil - needed HOH assist and VC throughout.  Supine leg raises x 10 bil  Manual Therapy: to decrease muscle spasm and pain and improve mobility   IASTM with foam roller to bil glutes/proximal hamstrings and QL   PATIENT EDUCATION:  Education details: reminded to get new shoes, current shoes are tripping hazard due to loose sole.   Person educated: Patient Education method: Explanation Education comprehension: verbalized understanding   HOME EXERCISE PROGRAM: Access Code: [email or web address]    ASSESSMENT:  CLINICAL IMPRESSION: Josph Macho had some reports of mild soreness after last couple of sessions.  We continued to work on strengthening of the hips to improve mobility and stabilization. Cues with leg press were required to keep tension on muscles. Finished session with MT to decrease stiffness and restricted mobility in LB. Pt demonstrated a good response.   OBJECTIVE IMPAIRMENTS decreased activity tolerance, decreased endurance, decreased mobility, difficulty walking, decreased ROM, decreased strength,  hypomobility, impaired perceived functional ability, increased muscle spasms, impaired flexibility, postural dysfunction, obesity, and pain.   ACTIVITY LIMITATIONS carrying, lifting, bending, sleeping, and locomotion level  PARTICIPATION LIMITATIONS: cleaning, shopping, community activity, and yard work  PERSONAL FACTORS Age, Behavior pattern, Past/current experiences, Time since onset of injury/illness/exacerbation, and 3+ comorbidities: Chronic kidney disease, gout, history of R kidney surgery, abdominal surgeries, prostate and skin cancer, obesity, HTN, chronic bil shoulder pain, chronic LBP, neck pain.  are also affecting patient's functional outcome.   REHAB POTENTIAL: Good  CLINICAL DECISION MAKING: Evolving/moderate complexity  EVALUATION COMPLEXITY: Moderate   GOALS: Goals reviewed with patient? Yes  SHORT TERM GOALS: Target date: 07/02/2021   Patient will be independent with initial HEP.  Baseline: given Goal status: MET 07/21/21 reports good compliance   LONG TERM GOALS: Target date: 07/30/2021    Patient will be independent with advanced/ongoing HEP to improve outcomes and carryover.  Baseline: needs advancement Goal status: IN PROGRESS  2.  Patient will report 75% improvement in R hip/ low back pain to improve QOL.  Baseline: 8-9/10 R hip, LB 6-7/10 Goal status: IN PROGRESS  3.  Patient will demonstrate 4+/5 bil LE strength throughout to decrease muscle imbalance. Baseline: see objective.  Also weakness in core.  Goal status: IN PROGRESS  4.  Patient will report 58% on LEFS to demonstrate improved functional ability.  Baseline: 46% Goal status: IN PROGRESS   5.  Patient will tolerate 30 min of walking without increased R hip/knee pain in order to participate in regular exercise. Baseline: unable Goal status: IN PROGRESS  8.  Patient will be able to transition to community based exercise program to maintain gains.  Baseline: unable Goal status: IN  PROGRESS    PLAN: PT FREQUENCY: 1-2x/week  PT DURATION: 6 weeks  PLANNED INTERVENTIONS: Therapeutic exercises, Therapeutic activity, Neuromuscular re-education, Balance training, Gait training, Patient/Family education, Joint mobilization, Stair training, Dry Needling, Electrical stimulation, Spinal mobilization, Cryotherapy, Moist heat, Traction, Ultrasound, Ionotophoresis 4mg /ml Dexamethasone, and Manual therapy.  PLAN FOR NEXT SESSION: review body mechanics, add forward T's to HEP, continue LE strengthening, whole body movements.  Manual therapy/modalities PRN,    Artist Pais, PTA 07/23/2021, 6:02 PM

## 2021-07-23 NOTE — Assessment & Plan Note (Signed)
Well controlled, no changes to meds. Encouraged heart healthy diet such as the DASH diet and exercise as tolerated.  °

## 2021-07-23 NOTE — Assessment & Plan Note (Signed)
Tolerating statin, encouraged heart healthy diet, avoid trans fats, minimize simple carbs and saturated fats. Increase exercise as tolerated 

## 2021-07-28 ENCOUNTER — Other Ambulatory Visit: Payer: Self-pay | Admitting: Family Medicine

## 2021-07-28 ENCOUNTER — Ambulatory Visit: Payer: Medicare Other

## 2021-07-30 ENCOUNTER — Ambulatory Visit: Payer: Medicare Other | Admitting: Physical Therapy

## 2021-07-30 ENCOUNTER — Encounter: Payer: Self-pay | Admitting: Physical Therapy

## 2021-07-30 DIAGNOSIS — M5459 Other low back pain: Secondary | ICD-10-CM

## 2021-07-30 DIAGNOSIS — M533 Sacrococcygeal disorders, not elsewhere classified: Secondary | ICD-10-CM

## 2021-07-30 DIAGNOSIS — G8929 Other chronic pain: Secondary | ICD-10-CM | POA: Diagnosis not present

## 2021-07-30 DIAGNOSIS — M6281 Muscle weakness (generalized): Secondary | ICD-10-CM

## 2021-07-30 DIAGNOSIS — M25561 Pain in right knee: Secondary | ICD-10-CM | POA: Diagnosis not present

## 2021-07-30 NOTE — Therapy (Addendum)
OUTPATIENT PHYSICAL THERAPY TREATMENT PHYSICAL THERAPY DISCHARGE SUMMARY  Visits from Start of Care: 8  Current functional level related to goals / functional outcomes: Improved LE strength, decreased back & hip pain.    Remaining deficits: Some dyspnea on exertion, tightness in back, decreased exercise tolerance   Education / Equipment: HEP  Plan: Patient agrees to discharge.  Patient is being discharged due to meeting the stated rehab goals.        Patient Name: David [name] MRN: [id number] DOB:[birth year], 76 y.o., male Today's Date: 07/30/2021   PT End of Session - 07/30/21 1450     Visit Number 8    Number of Visits 12    Date for PT Re-Evaluation 07/30/21    PT Start Time 8563    PT Stop Time 1497    PT Time Calculation (min) 43 min    Activity Tolerance Patient tolerated treatment well    Behavior During Therapy [facility] for tasks assessed/performed                Past Medical History:  Diagnosis Date   Adenoma of right adrenal gland 07/11/2002   2.4cm , noted on CT ABD   Allergy    Anxiety    Arthritis of both knees 03/26/2016   Astigmatism    Back pain    BCC (basal cell carcinoma of skin)    under right eye and right ear   Blood transfusion without reported diagnosis    Cataract 09/29/2016   Depression    Diverticulitis 2009   Diverticulosis    Family history of breast cancer    Family history of colon cancer    Fatty liver    GERD (gastroesophageal reflux disease)    Gout    Hearing loss of both ears 03/26/2016   History of atrial fibrillation    x 2 none since 2020   History of chronic prostatitis    started at age 42   History of kidney stones    Hyperglycemia    Hyperlipidemia    Hypertension    Incomplete right bundle branch block (RBBB)    Internal hemorrhoids    Kidney lesion 06/07/2015   Right midportion, 1.1x1.1 cm hyperechoic, noted on [place] ABD   LAFB (left anterior fascicular block)    Lipoma of axilla 09/2016   Liver  lesion, right lobe 06/07/2015   2.5x2.4x2.4 cm hypoechoic lesion posterior aspect, noted on [place] ABD   Low back pain 03/26/2016   LVH (left ventricular hypertrophy) 08/06/2017   Moderate, Noted on ECHO   Medicare annual wellness visit, subsequent 03/12/2014   OA (osteoarthritis)    Back, Hands, Neck   Obesity 11/25/2007   Qualifier: Diagnosis of  By: Lenna Gilford MD, Deborra Medina    Other malaise and fatigue 03/13/2013   Premature ventricular complex    Prostate cancer ([place]) dx 2021   Renal insufficiency    Kidney removed right   Scoliosis    Upper thoracic and lumbar   Sinus bradycardia 08/06/2017   Noted on EKG   Vitamin D deficiency    resolved   Past Surgical History:  Procedure Laterality Date   CARDIOVERSION  07/31/2017   CATARACT EXTRACTION, BILATERAL     COLON SURGERY  2009   segmental sigmoid resection   COLONOSCOPY     CYSTOSCOPY N/A 07/28/2019   Procedure: CYSTOSCOPY FLEXIBLE;  Surgeon: Cleon Gustin, MD;  Location: [facility];  Service: Urology;  Laterality: N/A;   CYSTOSCOPY W/ RETROGRADES  Right 06/07/2018   Procedure: CYSTOSCOPY WITH RETROGRADE PYELOGRAM, uphrostogram;  Surgeon: Cleon Gustin, MD;  Location: [facility];  Service: Urology;  Laterality: Right;   CYSTOSCOPY WITH URETEROSCOPY AND STENT PLACEMENT Right 02/28/2018   Procedure: CYSTOSCOPY WITH URETEROSCOPY [place];  Surgeon: Kathie Rhodes, MD;  Location: [facility];  Service: Urology;  Laterality: Right;   CYSTOSCOPY/URETEROSCOPY/HOLMIUM LASER/STENT PLACEMENT Right 11/29/2017   Procedure: CYSTOSCOPY/RETROGRADE/URETEROSCOPY/HOLMIUM LASER/STENT PLACEMENT;  Surgeon: Kathie Rhodes, MD;  Location: [facility];  Service: Urology;  Laterality: Right;   EYE SURGERY Bilateral 01/12/2017   cataract removal   history of blood tranfusion  age 76   HOLMIUM LASER APPLICATION Right 04/20/8117   Procedure: HOLMIUM LASER APPLICATION;  Surgeon: Kathie Rhodes, MD;  Location: [facility];  Service: Urology;  Laterality: Right;   IR BALLOON DILATION URETERAL STRICTURE RIGHT  03/14/2018   IR NEPHRO TUBE REMOV/FL  03/17/2018   IR NEPHROSTOGRAM RIGHT THRU EXISTING ACCESS  03/17/2018   IR NEPHROSTOMY EXCHANGE RIGHT  03/14/2018   IR NEPHROSTOMY EXCHANGE RIGHT  06/21/2018   IR NEPHROSTOMY PLACEMENT RIGHT  03/02/2018   IR NEPHROSTOMY PLACEMENT RIGHT  04/20/2018   IR URETERAL STENT PLACEMENT EXISTING ACCESS RIGHT  03/14/2018   NOSE SURGERY     Submucous resection age 76   NUCLEAR STRESS TEST  06/03/2009   RADIOACTIVE SEED IMPLANT N/A 07/28/2019   Procedure: RADIOACTIVE SEED IMPLANT/BRACHYTHERAPY IMPLANT;  Surgeon: Cleon Gustin, MD;  Location: [facility];  Service: Urology;  Laterality: N/A;  90 MINS   ROBOT ASSISTED LAPAROSCOPIC NEPHRECTOMY Right 08/04/2018   Procedure: XI ROBOTIC ASSISTED LAPAROSCOPIC NEPHRECTOMY;  Surgeon: Cleon Gustin, MD;  Location: [facility];  Service: Urology;  Laterality: Right;  3 HRS   ROBOT ASSISTED PYELOPLASTY Right 06/07/2018   Procedure: attempted XI ROBOTIC ASSISTED PYELOPLASTY, lysis of adhesions;  Surgeon: Cleon Gustin, MD;  Location: [facility];  Service: Urology;  Laterality: Right;  3 HRS   SKIN BIOPSY     SPACE OAR INSTILLATION N/A 07/28/2019   Procedure: SPACE OAR INSTILLATION;  Surgeon: Cleon Gustin, MD;  Location: [facility];  Service: Urology;  Laterality: N/A;   URETEROSCOPY WITH HOLMIUM LASER LITHOTRIPSY Right 09/24/2017   Procedure: CYSTOSCOPY, URETEROSCOPY WITH HOLMIUM LASER LITHOTRIPSY, STENT PLACEMENT;  Surgeon: Kathie Rhodes, MD;  Location: [facility];  Service: Urology;  Laterality: Right;   Patient Active Problem List   Diagnosis Date Noted   Sun-damaged skin 07/23/2021   H/O prostate cancer 07/23/2021   Left knee pain 07/23/2021   Coronary atherosclerosis 02/11/2021   Myalgia 11/04/2020   Neck pain 11/04/2020   Stage 3a chronic kidney disease ([place]) 07/02/2020   Urinary  frequency 02/12/2020   Genetic testing 09/26/2019   Family history of breast cancer    Family history of colon cancer    Educated about COVID-19 virus infection 08/26/2019   Malignant neoplasm of prostate ([place]) 04/28/2019   Pruritus 02/03/2019   LVH (left ventricular hypertrophy) 10/30/2018   Tinea versicolor 09/28/2018   Thoracic back pain 09/26/2018   Hypercalcemia 08/08/2018   Nonfunctioning kidney 08/04/2018   Arm pain 07/18/2018   Right hip pain 06/22/2018   Ureteral obstruction 06/07/2018   Recurrent nephrolithiasis 04/23/2018   Hydronephrosis 03/01/2018   Floppy eyelid syndrome of both eyes 01/24/2018   Foot pain, bilateral 12/28/2017   Kidney stone 09/18/2017   Insomnia 09/17/2017   Abnormal TSH 04/15/2017   Senile nuclear sclerosis 01/18/2017   Cataract 09/29/2016  Muscle cramp 09/29/2016   Nocturia 09/29/2016   Hyperglycemia 03/29/2016   Low back pain 03/26/2016   Cataracts, bilateral 03/26/2016   Arthritis of both knees 03/26/2016   Hearing loss 03/26/2016   Complex renal cyst 06/16/2015   History of atrial fibrillation 06/16/2015   Snoring 06/07/2015   Somnolence 06/07/2015   Preventative health care 03/07/2015   Lipoma of axilla 06/04/2014   Tinea cruris 81/01/7508   Lichen simplex chronicus 03/20/2014   Medicare annual wellness visit, subsequent 03/12/2014   Depression with anxiety 09/25/2013   Constipation 09/25/2013   Gouty arthritis of toe of left foot 08/01/2013   Skin lesion 05/27/2013   Rash 05/26/2013   Abnormal liver function 03/18/2013   IBS (irritable bowel syndrome) 03/18/2013   Other malaise and fatigue 03/13/2013   Tinea corporis 03/13/2013   Gout 12/14/2012   Diarrhea 12/14/2012   Rectal irritation 09/18/2012   Diverticulosis    BCC (basal cell carcinoma of skin)    Scoliosis 01/19/2011   Hyperlipidemia, mixed 03/11/2010   Essential hypertension, benign 03/11/2010   ALLERGIC RHINITIS 03/11/2010   Arthralgia 03/11/2010   Obesity  11/25/2007   BENIGN NEOPLASM OF ADRENAL GLAND 09/22/2007   Fatty liver disease, nonalcoholic 25/85/2778   Osteoarthritis 09/22/2007   SPONDYLOSIS, LUMBAR 09/22/2007   GERD 04/12/2007    PCP: Mosie Lukes MD  REFERRING PROVIDER: Terrilyn Saver, NP  REFERRING DIAG: [phone] (ICD-10-CM) - Chronic pain of right knee M25.511,G89.29,M25.512 (ICD-10-CM) - Chronic pain of both shoulders M25.551 (ICD-10-CM) - Right hip pain   Rationale for Evaluation and Treatment Rehabilitation  THERAPY DIAG:  Other low back pain  SI (sacroiliac) pain  Chronic pain of right knee  Muscle weakness (generalized)  ONSET DATE: chronic, worsened over several years  SUBJECTIVE:                                                                                                                                                                                           SUBJECTIVE STATEMENT: Patient reports he feels he's made good progress, his R hip and back have not been bothering him as much.  He feels it would be better to get on a schedule to do regular exercise at home.  He has been doing more yard work around the house and trying to walk more - usually still walks in stores.  Woke up this morning without pain.      PERTINENT HISTORY:  Chronic kidney disease, gout, history of R kidney surgery, abdominal surgeries, prostate and skin cancer, obesity, HTN, chronic bil shoulder pain, chronic LBP, neck pain.   PAIN:  Are you having pain? Yes:  NPRS scale: 0/10 Pain location: back - stiff   PRECAUTIONS: None  WEIGHT BEARING RESTRICTIONS No  FALLS:  Has patient fallen in last 6 months? No  LIVING ENVIRONMENT: Lives with: lives with their spouse Lives in: House/apartment Stairs: Yes: Internal: 12 steps; can reach both to office upstairs Has following equipment at home: Single point cane uses occasionally due to gout  OCCUPATION: retired Optometrist  PLOF: Independent  PATIENT GOALS loosen up  a little, get back to walking   OBJECTIVE:   DIAGNOSTIC FINDINGS:  05/27/2021  Xrays: Hip Bilateral hip joint degenerative changes. Left Shoulder L Mild degenerative changes of the glenohumeral joint. R shoulder Mild AC and glenohumeral joint degenerative changes. R knee Chondrocalcinosis. Mild patellofemoral joint degenerative changes. Mild medial compartment joint degenerative changes.  PATIENT SURVEYS:  LEFS 46%  COGNITION:  Overall cognitive status: Within functional limits for tasks assessed     SENSATION: Not tested  MUSCLE LENGTH: Hamstrings: mild tightness Quadriceps: mild tightness bil  POSTURE: rounded shoulders and forward head  PALPATION: Tenderness R SIJ, R piriformis, R glute medius.  Decreased mobility lumbar spine with PA mobs, reports tenderness L3-5.  Tenderness R quadriceps tendon.   NO tenderness R hip trochanter.   LUMBAR ROM:   Active  AROM  eval 07/30/21  Flexion To toes To toes  Extension 75%, inc pain midline LB WNL  Right lateral flexion To knee WNL  Left lateral flexion To knee WNL  Right rotation 75%, Inc pain LB WNL  Left rotation 75%, Inc pain LB WNL   (Blank rows = not tested)  LOWER EXTREMITY ROM:   Hip ROM WNL and symmetric bil, no pain.     LOWER EXTREMITY MMT:    MMT Right eval Left eval Right 07/30/21 Left  07/30/21  Hip flexion _0 Hip extension _1 Hip abduction _2 Hip adduction _3 Knee flexion _4 Knee extension _5 Ankle dorsiflexion _6 (Blank rows = not tested)  LUMBAR SPECIAL TESTS:  Straight leg raise test: Negative and FABER test: Negative  KNEE & HIP SPECIAL TESTS: R knee ligaments intact, negative Ober's Test R  FUNCTIONAL TESTS:  5 times sit to stand: 13.6 seconds without UE assist   TODAY'S TREATMENT  07/30/2021 Therapeutic Exercise: to improve strength and mobility.  Nustep L6 x 10 min.  Review of HEP, progression to include standing Itband stretch for tightness R  hip/back - tolerated well.  Gait x 1500' (6 minutes) Self Care:  Provided information on programs at senior center.  Education on how to maintain progress, transitioning to gym based exercise program, discussed different programs available including [facility] for [facility] aerobics, or F[facility] Center at [facility].   07/23/21 Therapeutic Exercise: NuStep L6x36mn Fwd lunge 10x bil Side lunge 10x bil Pallof press and trunk blue TB 10x bil Leg press 35# 2x10 Knee flexion 25# 2x10 Knee extension 15# 2x10  Manual Therapy: STM to R lumbar parapsinals, glutes  07/21/2021 Therapeutic Exercise: to improve strength and mobility.  Demo, verbal and tactile cues throughout for technique. Bike L1x 6 min seat # 7 At counter for safety with SBA Heel/toe raises x 20 Hip abduction x 15 bil  Hip extension x 15 bil  Knee flexion x 10 bil  Hip flexion x 15 bil   Forward lunge with raised arm x 10 bil - 1  hand on counter for safety- noted increased dyspnea on exertion with arm raises In supine (head elevated) SLR x 15 bil  LTR x 10  Windshield wipers x 20  Prone knee bends x 10 bil  Prone leg extensions x 10 bil  Manual Therapy: to decrease muscle spasm and pain and improve mobility   IASTM with foam roller to bil glutes/proximal hamstrings and QL; UPA mobs to lumbar spine, sacral mobs.   07/16/2021 Therapeutic Exercise: to improve strength and mobility.  Demo, verbal and tactile cues throughout for technique. Nustep L5 x 6 min  At counter for safety with SBA Heel/toe raises x 10 Hip abduction x 10 bil  Hip extension x 10 bil  Hip flexion x 10 bil  In supine (head elevated due to shortness of breath)   SLR 2 x 10 bil  Bridges 2 x 10 Marches with TrA contraction 2 x 20  Manual Therapy: to decrease muscle spasm and pain and improve mobility   IASTM with foam roller to bil glutes/proximal hamstrings and QL  07/02/21 Therapeutic Exercise: to improve strength and mobility.  Demo, verbal and  tactile cues throughout for technique. Nustep L5 x 6 min  At counter for safety Heel/toe raises x 10 Hip abduction x 10 bil  Hip extension x 10 bil  Hip flexion x 10 bil  Sit to stands 2 x 10 seated rest break between Bridges 2 x 10 KTOS piriformis stretch 3 x 15 sec bil - needed HOH assist and VC throughout.  Supine leg raises x 10 bil  Manual Therapy: to decrease muscle spasm and pain and improve mobility   IASTM with foam roller to bil glutes/proximal hamstrings and QL   PATIENT EDUCATION:  Education details: reminded to get new shoes, current shoes are tripping hazard due to loose sole.   Person educated: Patient Education method: Explanation Education comprehension: verbalized understanding   HOME EXERCISE PROGRAM: Access Code: [email or web address]    ASSESSMENT:  CLINICAL IMPRESSION: David [name] reports waking with no pain today.  His LE strength has improved to 5/5 and he demonstrated full pain free ROM in back today.  He reports that he is much more active at home and consistent with HEP.  He feels ready to transition to gym based exercise program.   We reviewed exercises, added another standing stretch as finding some of the piriformis stretches difficulty even supine (unable to do in sitting), which he tolerated well.  Given information on programs at senior center and calendar as well, discussed mall walking as another option to increase walking distance in climate controlled environment.  He has met 5/6 LTG and is ready and agreeable to discharge today.    OBJECTIVE IMPAIRMENTS decreased activity tolerance, decreased endurance, decreased mobility, difficulty walking, decreased ROM, decreased strength, hypomobility, impaired perceived functional ability, increased muscle spasms, impaired flexibility, postural dysfunction, obesity, and pain.   ACTIVITY LIMITATIONS carrying, lifting, bending, sleeping, and locomotion level  PARTICIPATION LIMITATIONS: cleaning, shopping,  community activity, and yard work  PERSONAL FACTORS Age, Behavior pattern, Past/current experiences, Time since onset of injury/illness/exacerbation, and 3+ comorbidities: Chronic kidney disease, gout, history of R kidney surgery, abdominal surgeries, prostate and skin cancer, obesity, HTN, chronic bil shoulder pain, chronic LBP, neck pain.  are also affecting patient's functional outcome.   REHAB POTENTIAL: Good  CLINICAL DECISION MAKING: Evolving/moderate complexity  EVALUATION COMPLEXITY: Moderate   GOALS: Goals reviewed with patient? Yes  SHORT TERM GOALS: Target date: 07/02/2021   Patient will be independent with  initial HEP.  Baseline: given Goal status: MET 07/21/21 reports good compliance   LONG TERM GOALS: Target date: 07/30/2021    Patient will be independent with advanced/ongoing HEP to improve outcomes and carryover.  Baseline: needs advancement Goal status: MET  2.  Patient will report 75% improvement in R hip/ low back pain to improve QOL.  Baseline: 8-9/10 R hip, LB 6-7/10 Goal status: MET  07/30/2021 - 50-75% improvement.   3.  Patient will demonstrate 4+/5 bil LE strength throughout to decrease muscle imbalance. Baseline: see objective.  Also weakness in core.  Goal status: MET  4.  Patient will report 58% on LEFS to demonstrate improved functional ability.  Baseline: 46% Goal status: IN PROGRESS   5.  Patient will tolerate 30 min of walking without increased R hip/knee pain in order to participate in regular exercise. Baseline: unable Goal status: MET 07/30/21 - can walk at least 20 minutes  to up to an hour in grocery store without increased pain.   8.  Patient will be able to transition to community based exercise program to maintain gains.  Baseline: unable Goal status: MET 07/30/21 feels ready to transition.  Educated on importance of regular exercise.     PLAN: PT FREQUENCY: 1-2x/week  PT DURATION: 6 weeks  PLANNED INTERVENTIONS: Therapeutic  exercises, Therapeutic activity, Neuromuscular re-education, Balance training, Gait training, Patient/Family education, Joint mobilization, Stair training, Dry Needling, Electrical stimulation, Spinal mobilization, Cryotherapy, Moist heat, Traction, Ultrasound, Ionotophoresis 57m/ml Dexamethasone, and Manual therapy.  PLAN FOR NEXT SESSION: discharge to [facility], DPT  07/30/2021, 3:48 PM

## 2021-08-18 ENCOUNTER — Telehealth: Payer: Self-pay | Admitting: Family Medicine

## 2021-08-18 ENCOUNTER — Other Ambulatory Visit: Payer: Self-pay | Admitting: Family Medicine

## 2021-08-18 DIAGNOSIS — M1A9XX0 Chronic gout, unspecified, without tophus (tophi): Secondary | ICD-10-CM

## 2021-08-18 DIAGNOSIS — R739 Hyperglycemia, unspecified: Secondary | ICD-10-CM

## 2021-08-18 DIAGNOSIS — E782 Mixed hyperlipidemia: Secondary | ICD-10-CM

## 2021-08-18 DIAGNOSIS — I10 Essential (primary) hypertension: Secondary | ICD-10-CM

## 2021-08-18 NOTE — Telephone Encounter (Signed)
Please place future orders.

## 2021-08-18 NOTE — Telephone Encounter (Signed)
Pt called stating he needed a refill on his medication for treating a fungal infection. He does not remember the name and stated he had tossed the bottle.  Pharmacy:  [facility] [id number] [place], [place]  [address] [place], [place] [address]  Phone:  [phone]  Fax:  [phone]

## 2021-08-18 NOTE — Telephone Encounter (Signed)
Pt called stating that he would like to look into labs a week prior to his 10.17.23 appt with Dr. Charlett Blake.

## 2021-08-19 ENCOUNTER — Other Ambulatory Visit: Payer: Self-pay

## 2021-08-19 DIAGNOSIS — L280 Lichen simplex chronicus: Secondary | ICD-10-CM

## 2021-08-19 DIAGNOSIS — B356 Tinea cruris: Secondary | ICD-10-CM

## 2021-08-19 MED ORDER — CLOTRIMAZOLE-BETAMETHASONE 1-0.05 % EX LOTN: TOPICAL_LOTION | Freq: Two times a day (BID) | CUTANEOUS | 0 refills | Status: DC

## 2021-08-19 NOTE — Telephone Encounter (Signed)
Patient scheduled for lab appt.

## 2021-08-19 NOTE — Telephone Encounter (Signed)
FYI  I refilled lotrisone for the pt

## 2021-09-10 ENCOUNTER — Other Ambulatory Visit: Payer: Self-pay | Admitting: Cardiology

## 2021-09-10 NOTE — Telephone Encounter (Signed)
Eliquis '5mg'$  refill request received. Patient is 76 years old, weight-118.1kg, Crea-1.83 on 07/16/2021, Diagnosis-Afib, and last seen by Dr. Percival Spanish on 09/06/2020-needs appt. Dose is appropriate based on dosing criteria.   Will send a message to schedulers since pt needs an appt.

## 2021-09-11 ENCOUNTER — Other Ambulatory Visit: Payer: Self-pay | Admitting: Family Medicine

## 2021-09-11 MED ORDER — FLUCONAZOLE 150 MG PO TABS: 150.0000 mg | ORAL_TABLET | ORAL | 1 refills | Status: DC

## 2021-09-22 ENCOUNTER — Telehealth: Payer: Self-pay | Admitting: Cardiology

## 2021-09-22 ENCOUNTER — Encounter: Payer: Self-pay | Admitting: Cardiology

## 2021-09-22 NOTE — Telephone Encounter (Signed)
Call patient 3x to schedule overdue f/u with Dr. Percival Spanish, LVM x3 with no success in scheduling. Will send reminder letter via [place]

## 2021-09-24 DIAGNOSIS — N481 Balanitis: Secondary | ICD-10-CM | POA: Diagnosis not present

## 2021-09-26 ENCOUNTER — Other Ambulatory Visit: Payer: Self-pay | Admitting: Family Medicine

## 2021-09-26 DIAGNOSIS — F418 Other specified anxiety disorders: Secondary | ICD-10-CM

## 2021-09-28 NOTE — Progress Notes (Unsigned)
Cardiology Office Note   Date:  09/30/2021   ID:  David, [name] 1945/07/20, MRN [id number]  PCP:  Mosie Lukes, MD  Cardiologist:   Minus Breeding, MD   Chief Complaint  Patient presents with   Fatigue       History of Present Illness: David [name] is a 76 y.o. male who presents for follow up of atrial fib.  He has been seen in the Atrial Fib clinic.  He has had cardioversion.  He was in the ED with PAF a couple years ago.   Since then he has had no new cardiovascular complaints.  He thinks he would feel his atrial fibrillation and he has not.  He has not had any new urologic problems having had nephrectomy, prostate seed implants, kidney stones.   Since I last saw him he had chest discomfort..  It was no suggestion of an anginal etiology and he since says this has resolved.  He thinks this was GI.  There were no enzyme elevations or acute EKG changes.Marland Kitchen  He is very fatigued which he relates to his prostate therapy and possibly low testosterone.  He does not have any acute cardiovascular complaints.  In particular he is not having any palpitations, presyncope or syncope.  He does a little walking at [employer] for exercise.  He is not getting any chest pressure, neck or arm discomfort.  He still moving and carrying boxes which has been a slow process.  He has no cardiovascular complaints with this.  He is not having any PND or orthopnea.   Past Medical History:  Diagnosis Date   Adenoma of right adrenal gland 07/11/2002   2.4cm , noted on CT ABD   Anxiety    Arthritis of both knees 03/26/2016   Astigmatism    Back pain    BCC (basal cell carcinoma of skin)    under right eye and right ear   Blood transfusion without reported diagnosis    Cataract 09/29/2016   Depression    Diverticulitis 2009   Diverticulosis    Family history of breast cancer    Family history of colon cancer    Fatty liver    GERD (gastroesophageal reflux disease)    Gout    Hearing  loss of both ears 03/26/2016   History of atrial fibrillation    x 2 none since 2020   History of chronic prostatitis    started at age 87   History of kidney stones    Hyperglycemia    Hyperlipidemia    Hypertension    Incomplete right bundle branch block (RBBB)    Internal hemorrhoids    Kidney lesion 06/07/2015   Right midportion, 1.1x1.1 cm hyperechoic, noted on [place] ABD   LAFB (left anterior fascicular block)    Lipoma of axilla 09/2016   Liver lesion, right lobe 06/07/2015   2.5x2.4x2.4 cm hypoechoic lesion posterior aspect, noted on [place] ABD   Low back pain 03/26/2016   LVH (left ventricular hypertrophy) 08/06/2017   Moderate, Noted on ECHO   Medicare annual wellness visit, subsequent 03/12/2014   OA (osteoarthritis)    Back, Hands, Neck   Obesity 11/25/2007   Qualifier: Diagnosis of  By: Lenna Gilford MD, Deborra Medina    Other malaise and fatigue 03/13/2013   Premature ventricular complex    Prostate cancer [facility]) dx 2021   Renal insufficiency    Kidney removed right   Scoliosis    Upper  thoracic and lumbar   Sinus bradycardia 08/06/2017   Noted on EKG   Vitamin D deficiency    resolved    Past Surgical History:  Procedure Laterality Date   CARDIOVERSION  07/31/2017   CATARACT EXTRACTION, BILATERAL     COLON SURGERY  2009   segmental sigmoid resection   COLONOSCOPY     CYSTOSCOPY N/A 07/28/2019   Procedure: CYSTOSCOPY FLEXIBLE;  Surgeon: Cleon Gustin, MD;  Location: [facility];  Service: Urology;  Laterality: N/A;   CYSTOSCOPY W/ RETROGRADES Right 06/07/2018   Procedure: CYSTOSCOPY WITH RETROGRADE PYELOGRAM, uphrostogram;  Surgeon: Cleon Gustin, MD;  Location: [facility];  Service: Urology;  Laterality: Right;   CYSTOSCOPY WITH URETEROSCOPY AND STENT PLACEMENT Right 02/28/2018   Procedure: CYSTOSCOPY WITH URETEROSCOPY [place];  Surgeon: Kathie Rhodes, MD;  Location: [facility];  Service: Urology;  Laterality: Right;    CYSTOSCOPY/URETEROSCOPY/HOLMIUM LASER/STENT PLACEMENT Right 11/29/2017   Procedure: CYSTOSCOPY/RETROGRADE/URETEROSCOPY/HOLMIUM LASER/STENT PLACEMENT;  Surgeon: Kathie Rhodes, MD;  Location: [facility];  Service: Urology;  Laterality: Right;   EYE SURGERY Bilateral 01/12/2017   cataract removal   history of blood tranfusion  age 64   HOLMIUM LASER APPLICATION Right 9/81/1914   Procedure: HOLMIUM LASER APPLICATION;  Surgeon: Kathie Rhodes, MD;  Location: [facility];  Service: Urology;  Laterality: Right;   IR BALLOON DILATION URETERAL STRICTURE RIGHT  03/14/2018   IR NEPHRO TUBE REMOV/FL  03/17/2018   IR NEPHROSTOGRAM RIGHT THRU EXISTING ACCESS  03/17/2018   IR NEPHROSTOMY EXCHANGE RIGHT  03/14/2018   IR NEPHROSTOMY EXCHANGE RIGHT  06/21/2018   IR NEPHROSTOMY PLACEMENT RIGHT  03/02/2018   IR NEPHROSTOMY PLACEMENT RIGHT  04/20/2018   IR URETERAL STENT PLACEMENT EXISTING ACCESS RIGHT  03/14/2018   NOSE SURGERY     Submucous resection age 58   NUCLEAR STRESS TEST  06/03/2009   RADIOACTIVE SEED IMPLANT N/A 07/28/2019   Procedure: RADIOACTIVE SEED IMPLANT/BRACHYTHERAPY IMPLANT;  Surgeon: Cleon Gustin, MD;  Location: [facility];  Service: Urology;  Laterality: N/A;  90 MINS   ROBOT ASSISTED LAPAROSCOPIC NEPHRECTOMY Right 08/04/2018   Procedure: XI ROBOTIC ASSISTED LAPAROSCOPIC NEPHRECTOMY;  Surgeon: Cleon Gustin, MD;  Location: [facility];  Service: Urology;  Laterality: Right;  3 HRS   ROBOT ASSISTED PYELOPLASTY Right 06/07/2018   Procedure: attempted XI ROBOTIC ASSISTED PYELOPLASTY, lysis of adhesions;  Surgeon: Cleon Gustin, MD;  Location: [facility];  Service: Urology;  Laterality: Right;  3 HRS   SKIN BIOPSY     SPACE OAR INSTILLATION N/A 07/28/2019   Procedure: SPACE OAR INSTILLATION;  Surgeon: Cleon Gustin, MD;  Location: [facility];  Service: Urology;  Laterality: N/A;   URETEROSCOPY WITH HOLMIUM LASER LITHOTRIPSY Right 09/24/2017   Procedure:  CYSTOSCOPY, URETEROSCOPY WITH HOLMIUM LASER LITHOTRIPSY, STENT PLACEMENT;  Surgeon: Kathie Rhodes, MD;  Location: [facility];  Service: Urology;  Laterality: Right;     Current Outpatient Medications  Medication Sig Dispense Refill   apixaban (ELIQUIS) 5 MG TABS tablet Take 1 tablet (5 mg total) by mouth 2 (two) times daily. Please call office for Cardiology appt for Eliquis refills.  Thank you 60 tablet 0   ascorbic acid (VITAMIN C) 1000 MG tablet Take by mouth.     atenolol (TENORMIN) 50 MG tablet TAKE ONE TABLET BY MOUTH TWICE A DAY 180 tablet 1   atorvastatin (LIPITOR) 10 MG tablet TAKE ONE TABLET BY MOUTH EVERY MORNING 90 tablet 1  clotrimazole-betamethasone (LOTRISONE) lotion Apply topically 2 (two) times daily. For 2 weeks. (Patient taking differently: Apply topically daily. For 2 weeks.) 30 mL 0   colchicine 0.6 MG tablet For gout flares, take 1 tab po once then repeat in 1 hour as needed if pain persists up to a total of 3 doses 15 tablet 0   fluconazole (DIFLUCAN) 150 MG tablet Take 1 tablet (150 mg total) by mouth every other day. (Patient taking differently: Take 150 mg by mouth in the morning and at bedtime.) 3 tablet 1   FLUoxetine (PROZAC) 10 MG tablet Take 1 tablet (10 mg total) by mouth 3 (three) times daily. 270 tablet 1   fluticasone (FLONASE) 50 MCG/ACT nasal spray Place 2 sprays into both nostrils daily. 16 g 0   LORazepam (ATIVAN) 1 MG tablet TAKE ONE TABLET BY MOUTH TWICE A DAY AS NEEDED FOR ANXIETY OR SEDATION (Patient taking differently: 2 (two) times daily.) 180 tablet 1   NYAMYC powder Apply topically.     omeprazole (PRILOSEC) 20 MG capsule Take 20 mg by mouth daily as needed (acid reflux).      No current facility-administered medications for this visit.    Allergies:   Cefaclor, Cephalosporins, and Penicillins    ROS:  Please see the history of present illness.   Otherwise, review of systems are positive for none.   All other systems are  reviewed and negative.    PHYSICAL EXAM: VS:  BP (!) 138/90   Pulse (!) 56   Ht '5\' 11"'$  (1.803 m)   Wt 257 lb 3.2 oz (116.7 kg)   BMI 35.87 kg/m  , BMI Body mass index is 35.87 kg/m. GENERAL:  Well appearing NECK:  No jugular venous distention, waveform within normal limits, carotid upstroke brisk and symmetric, no bruits, no thyromegaly LUNGS:  Clear to auscultation bilaterally CHEST:  Unremarkable HEART:  PMI not displaced or sustained,S1 and S2 within normal limits, no S3, no S4, no clicks, no rubs, no murmurs ABD:  Flat, positive bowel sounds normal in frequency in pitch, no bruits, no rebound, no guarding, no midline pulsatile mass, no hepatomegaly, no splenomegaly EXT:  2 plus pulses throughout, no edema, no cyanosis no clubbing   EKG:  EKG is  ordered today. Sinus rhythm, rate 56, left axis deviation, poor anterior R wave progression, no acute ST-T wave changes.  Recent Labs: 12/20/2020: Magnesium 1.9 07/16/2021: ALT 23; BUN 15; Creatinine, Ser 1.83; Hemoglobin 14.0; Platelets 191.0; Potassium 4.1; Sodium 138; TSH 3.63    Lipid Panel    Component Value Date/Time   CHOL 150 07/16/2021 1355   TRIG 162.0 (H) 07/16/2021 1355   HDL 35.60 (L) 07/16/2021 1355   CHOLHDL 4 07/16/2021 1355   VLDL 32.4 07/16/2021 1355   LDLCALC 82 07/16/2021 1355   LDLCALC 107 (H) 12/20/2020 1426   LDLDIRECT 168.5 03/29/2006 1007      Wt Readings from Last 3 Encounters:  09/30/21 257 lb 3.2 oz (116.7 kg)  07/22/21 260 lb 6.4 oz (118.1 kg)  05/27/21 254 lb 12.8 oz (115.6 kg)      Other studies Reviewed: Additional studies/ records that were reviewed today include: None Review of the above records demonstrates:  See elsewhere   ASSESSMENT AND PLAN:  ATRIAL FIB:    David [name] has a CHA2DS2 - VASc score of 2.  He has had no symptomatic paroxysms.  He will continue with anticoagulation.  He is up-to-date with blood work.  He tolerates anticoagulation.  HTN: Blood pressure is at  target.  No change in therapy.   CKD IIIA: His creatinine is 1.83 which is about unchanged from previous.   AORTIC ROOT ENLARGEMENT:   This was 43 mm in October.  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   CT will need to be ordered in October with imaging of the abdomen as well.  He understands that there is slight risk with his kidneys but he is tolerated this on multiple occasions with creatinine of that level.  FATIGUE: I suspect this is multifactorial and I will have him follow-up with his urologist since he brings up the issue of low testosterone.  However, if there is no clear etiology (TSH and CBC were normal) we could consider sleep apnea work-up.   Current medicines are reviewed at length with the patient today.  The patient does not have concerns regarding medicines.  The following changes have been made: None  Labs/ tests ordered today include:    Orders Placed This Encounter  Procedures   CT ANGIO CHEST AORTA W/CM & OR WO/CM   CT ANGIO ABDOMEN PELVIS  W &/OR WO CONTRAST   Basic metabolic panel   EKG [id number]      Disposition:   FU with me in 12 months.     Signed, Minus Breeding, MD  09/30/2021 4:47 PM    [facility]

## 2021-09-30 ENCOUNTER — Encounter: Payer: Self-pay | Admitting: Cardiology

## 2021-09-30 ENCOUNTER — Ambulatory Visit: Payer: Medicare Other | Attending: Cardiology | Admitting: Cardiology

## 2021-09-30 VITALS — BP 138/90 | HR 56 | Ht 71.0 in | Wt 257.2 lb

## 2021-09-30 DIAGNOSIS — I7789 Other specified disorders of arteries and arterioles: Secondary | ICD-10-CM | POA: Diagnosis not present

## 2021-09-30 DIAGNOSIS — I10 Essential (primary) hypertension: Secondary | ICD-10-CM | POA: Diagnosis not present

## 2021-09-30 DIAGNOSIS — I728 Aneurysm of other specified arteries: Secondary | ICD-10-CM

## 2021-09-30 DIAGNOSIS — I480 Paroxysmal atrial fibrillation: Secondary | ICD-10-CM

## 2021-09-30 DIAGNOSIS — N183 Chronic kidney disease, stage 3 unspecified: Secondary | ICD-10-CM | POA: Diagnosis not present

## 2021-09-30 NOTE — Patient Instructions (Signed)
Medication Instructions:  Your physician recommends that you continue on your current medications as directed. Please refer to the Current Medication list given to you today.  *If you need a refill on your cardiac medications before your next appointment, please call your pharmacy*   Lab Work: BMET today  If you have labs (blood work) drawn today and your tests are completely normal, you will receive your results only by: [place] (if you have MyChart) OR A paper copy in the mail If you have any lab test that is abnormal or we need to change your treatment, we will call you to review the results.   Testing/Procedures: CTA chest/aorta +CTA abdomen/pelvis   Follow-Up: At [facility], you and your health needs are our priority.  As part of our continuing mission to provide you with exceptional heart care, we have created designated Provider Care Teams.  These Care Teams include your primary Cardiologist (physician) and Advanced Practice Providers (APPs -  Physician Assistants and Nurse Practitioners) who all work together to provide you with the care you need, when you need it.  We recommend signing up for the patient portal called "MyChart".  Sign up information is provided on this After Visit Summary.  MyChart is used to connect with patients for Virtual Visits (Telemedicine).  Patients are able to view lab/test results, encounter notes, upcoming appointments, etc.  Non-urgent messages can be sent to your provider as well.   To learn more about what you can do with MyChart, go to [email or web address].    Your next appointment:   12 month(s)  The format for your next appointment:   In Person  Provider:   Minus Breeding, MD

## 2021-10-01 LAB — BASIC METABOLIC PANEL
BUN/Creatinine Ratio: 8 — ABNORMAL LOW (ref 10–24)
BUN: 16 mg/dL (ref 8–27)
CO2: 23 mmol/L (ref 20–29)
Calcium: 10.3 mg/dL — ABNORMAL HIGH (ref 8.6–10.2)
Chloride: 102 mmol/L (ref 96–106)
Creatinine, Ser: 1.94 mg/dL — ABNORMAL HIGH (ref 0.76–1.27)
Glucose: 100 mg/dL — ABNORMAL HIGH (ref 70–99)
Potassium: 4.3 mmol/L (ref 3.5–5.2)
Sodium: 140 mmol/L (ref 134–144)
eGFR: 35 mL/min/{1.73_m2} — ABNORMAL LOW (ref >59)

## 2021-10-02 ENCOUNTER — Other Ambulatory Visit: Payer: Self-pay | Admitting: *Deleted

## 2021-10-02 DIAGNOSIS — N183 Chronic kidney disease, stage 3 unspecified: Secondary | ICD-10-CM

## 2021-10-15 ENCOUNTER — Other Ambulatory Visit (HOSPITAL_BASED_OUTPATIENT_CLINIC_OR_DEPARTMENT_OTHER): Payer: Self-pay

## 2021-10-15 ENCOUNTER — Encounter (HOSPITAL_BASED_OUTPATIENT_CLINIC_OR_DEPARTMENT_OTHER): Payer: Self-pay

## 2021-10-15 ENCOUNTER — Emergency Department (HOSPITAL_BASED_OUTPATIENT_CLINIC_OR_DEPARTMENT_OTHER): Payer: Medicare Other

## 2021-10-15 ENCOUNTER — Emergency Department (HOSPITAL_BASED_OUTPATIENT_CLINIC_OR_DEPARTMENT_OTHER)
Admission: EM | Admit: 2021-10-15 | Discharge: 2021-10-15 | Disposition: A | Discharge status: Home / Self Care | Payer: Medicare Other | Attending: Emergency Medicine | Admitting: Emergency Medicine

## 2021-10-15 ENCOUNTER — Other Ambulatory Visit: Payer: Self-pay

## 2021-10-15 DIAGNOSIS — R42 Dizziness and giddiness: Secondary | ICD-10-CM | POA: Diagnosis not present

## 2021-10-15 DIAGNOSIS — I10 Essential (primary) hypertension: Secondary | ICD-10-CM | POA: Insufficient documentation

## 2021-10-15 DIAGNOSIS — Z7901 Long term (current) use of anticoagulants: Secondary | ICD-10-CM | POA: Insufficient documentation

## 2021-10-15 DIAGNOSIS — Z79899 Other long term (current) drug therapy: Secondary | ICD-10-CM | POA: Diagnosis not present

## 2021-10-15 DIAGNOSIS — R110 Nausea: Secondary | ICD-10-CM | POA: Insufficient documentation

## 2021-10-15 DIAGNOSIS — R519 Headache, unspecified: Secondary | ICD-10-CM | POA: Diagnosis not present

## 2021-10-15 MED ORDER — ONDANSETRON 4 MG PO TBDP
4.0000 mg | ORAL_TABLET | Freq: Once | ORAL | Status: AC
  Administered 2021-10-15: 4 mg via ORAL
  Filled 2021-10-15: qty 1

## 2021-10-15 MED ORDER — MECLIZINE HCL 25 MG PO TABS
25.0000 mg | ORAL_TABLET | Freq: Once | ORAL | Status: AC
Start: 2021-10-15 — End: 2021-10-15
  Administered 2021-10-15: 25 mg via ORAL
  Filled 2021-10-15: qty 1

## 2021-10-15 MED ORDER — MECLIZINE HCL 25 MG PO TABS
25.0000 mg | ORAL_TABLET | Freq: Three times a day (TID) | ORAL | 0 refills | Status: DC | PRN
  Filled 2021-10-15: qty 30, 10d supply, fill #0

## 2021-10-15 MED ORDER — ONDANSETRON HCL 4 MG PO TABS
4.0000 mg | ORAL_TABLET | Freq: Four times a day (QID) | ORAL | 0 refills | Status: DC
Start: 2021-10-15 — End: 2022-08-25
  Filled 2021-10-15: qty 12, 3d supply, fill #0

## 2021-10-15 NOTE — ED Provider Notes (Signed)
[facility] EMERGENCY DEPARTMENT Provider Note   CSN: [id number] Arrival date & time: 10/15/21  1047     History  Chief Complaint  Patient presents with   Dizziness    David [name] is a 76 y.o. male.  Patient here with intermittent dizziness, nausea since this morning.  Worse when he lies flat and turns his head to the right.  Has chronic ringing in his ears.  States he feels better when he has no movement.  He had 2 bad episodes this morning but since being here he has felt a lot better and asymptomatic.  Denies any weakness, numbness, vision changes.  No chest pain or shortness of breath.  History of hypertension, diverticulitis, high cholesterol, patient on Eliquis.  Denies any trauma.  States he was feeling much better while in the waiting room but when he got up from wheelchair to sit down and lie back on that he started to feel symptoms again.  The history is provided by the patient.       Home Medications Prior to Admission medications   Medication Sig Start Date End Date Taking? Authorizing Provider  meclizine (ANTIVERT) 25 MG tablet Take 1 tablet (25 mg total) by mouth 3 (three) times daily as needed for up to 30 doses for dizziness. 10/15/21  Yes Lariza Cothron, DO  ondansetron (ZOFRAN) 4 MG tablet Take 1 tablet (4 mg total) by mouth every 6 (six) hours. 10/15/21  Yes Mileena Rothenberger, DO  apixaban (ELIQUIS) 5 MG TABS tablet Take 1 tablet (5 mg total) by mouth 2 (two) times daily. Please call office for Cardiology appt for Eliquis refills.  Thank you 09/12/21   Minus Breeding, MD  ascorbic acid (VITAMIN C) 1000 MG tablet Take by mouth.    [provider]  atenolol (TENORMIN) 50 MG tablet TAKE ONE TABLET BY MOUTH TWICE A DAY 05/05/21   Mosie Lukes, MD  atorvastatin (LIPITOR) 10 MG tablet TAKE ONE TABLET BY MOUTH EVERY MORNING 07/28/21   Mosie Lukes, MD  clotrimazole-betamethasone (LOTRISONE) lotion Apply topically 2 (two) times daily. For 2  weeks. Patient taking differently: Apply topically daily. For 2 weeks. 08/19/21   Mosie Lukes, MD  colchicine 0.6 MG tablet For gout flares, take 1 tab po once then repeat in 1 hour as needed if pain persists up to a total of 3 doses 05/20/20   Mosie Lukes, MD  fluconazole (DIFLUCAN) 150 MG tablet Take 1 tablet (150 mg total) by mouth every other day. Patient taking differently: Take 150 mg by mouth in the morning and at bedtime. 09/11/21   Mosie Lukes, MD  FLUoxetine (PROZAC) 10 MG tablet Take 1 tablet (10 mg total) by mouth 3 (three) times daily. 09/26/21   Mosie Lukes, MD  fluticasone (FLONASE) 50 MCG/ACT nasal spray Place 2 sprays into both nostrils daily. 03/31/21   Mar Daring, PA-C  LORazepam (ATIVAN) 1 MG tablet TAKE ONE TABLET BY MOUTH TWICE A DAY AS NEEDED FOR ANXIETY OR SEDATION Patient taking differently: 2 (two) times daily. 05/12/21   Mosie Lukes, MD  [facility] powder Apply topically. 10/18/20   [provider]  omeprazole (PRILOSEC) 20 MG capsule Take 20 mg by mouth daily as needed (acid reflux).     [provider]      Allergies    Cefaclor, Cephalosporins, and Penicillins    Review of Systems   Review of Systems  Physical Exam Updated Vital Signs BP Marland Kitchen)  157/81   Pulse (!) 55   Temp 97.8 F (36.6 C) (Oral)   Resp 16   Ht '5\' 11"'$  (1.803 m)   Wt 117 kg   SpO2 96%   BMI 35.98 kg/m  Physical Exam Vitals and nursing note reviewed.  Constitutional:      General: He is not in acute distress.    Appearance: He is well-developed. He is not ill-appearing.  HENT:     Head: Normocephalic and atraumatic.     Nose: Nose normal.     Mouth/Throat:     Mouth: Mucous membranes are moist.  Eyes:     Extraocular Movements: Extraocular movements intact.     Conjunctiva/sclera: Conjunctivae normal.     Pupils: Pupils are equal, round, and reactive to light.  Cardiovascular:     Rate and Rhythm: Normal rate and regular rhythm.     Pulses:  Normal pulses.     Heart sounds: Normal heart sounds. No murmur heard. Pulmonary:     Effort: Pulmonary effort is normal. No respiratory distress.     Breath sounds: Normal breath sounds.  Abdominal:     Palpations: Abdomen is soft.     Tenderness: There is no abdominal tenderness.  Musculoskeletal:        General: No swelling. Normal range of motion.     Cervical back: Normal range of motion and neck supple.  Skin:    General: Skin is warm and dry.     Capillary Refill: Capillary refill takes less than 2 seconds.  Neurological:     General: No focal deficit present.     Mental Status: He is alert and oriented to person, place, and time.     Cranial Nerves: No cranial nerve deficit.     Sensory: No sensory deficit.     Motor: No weakness.     Coordination: Coordination normal.     Gait: Gait normal.     Comments: 5+ out of 5 strength throughout, normal sensation, no drift, normal finger-to-nose finger, positive Dix-Hallpike to the right with horizontal nystagmus.  Psychiatric:        Mood and Affect: Mood normal.     ED Results / Procedures / Treatments   Labs (all labs ordered are listed, but only abnormal results are displayed) Labs Reviewed - No data to display  EKG EKG Interpretation  Date/Time:  Wednesday October 15 2021 11:06:43 EDT Ventricular Rate:  53 PR Interval:  196 QRS Duration: 96 QT Interval:  430 QTC Calculation: 403 R Axis:   -45 Text Interpretation: Sinus bradycardia When compared with ECG of 11-Mar-2021 22:46, PREVIOUS ECG IS PRESENT Confirmed by Lennice Sites ([id number]) on 10/15/2021 11:36:20 AM  Radiology CT Head Wo Contrast  Result Date: 10/15/2021 CLINICAL DATA:  Headache. EXAM: CT HEAD WITHOUT CONTRAST TECHNIQUE: Contiguous axial images were obtained from the base of the skull through the vertex without intravenous contrast. RADIATION DOSE REDUCTION: This exam was performed according to the departmental dose-optimization program which includes  automated exposure control, adjustment of the mA and/or kV according to patient size and/or use of iterative reconstruction technique. COMPARISON:  PET scan of November 11, 2020. FINDINGS: Brain: No evidence of acute infarction, hemorrhage, hydrocephalus, extra-axial collection or mass lesion/mass effect. Vascular: No hyperdense vessel or unexpected calcification. Skull: No fracture is noted. Stable left frontal skull lesion is noted most consistent with benign process such as fibrous dysplasia. Sinuses/Orbits: No acute finding. Other: None. IMPRESSION: No acute intracranial abnormality seen. Electronically Signed   By:  Marijo Conception M.D.   On: 10/15/2021 12:01    Procedures Procedures    Medications Ordered in ED Medications  meclizine (ANTIVERT) tablet 25 mg (25 mg Oral Given 10/15/21 1123)  ondansetron (ZOFRAN-ODT) disintegrating tablet 4 mg (4 mg Oral Given 10/15/21 1123)    ED Course/ Medical Decision Making/ A&P                           Medical Decision Making Amount and/or Complexity of Data Reviewed Radiology: ordered.  Risk Prescription drug management.   David [name] is here with dizziness.  Normal vitals.  No fever.  Differential diagnosis is likely peripheral vertigo.  I have no concern for stroke at this time.  He is on Eliquis.  We will get a head CT to rule out spontaneous bleed.  He has positive Dix-Hallpike.  Horizontal nystagmus when he looks to the right.  He is asymptomatic when he is at rest.  He gets intensely nauseous when I lie his head back flat him look to the right.  We will treat with meclizine and Zofran.  Otherwise he has a normal neurological exam.  He has no chest pain or shortness of breath.  EKG shows sinus rhythm.  No ischemic changes.  He has chronic ringing in his years.  Suspect likely benign positional vertigo but could be some other minimum ear issue.  Ear exam is unremarkable.  He follows with ENT already.  Per my evaluation of head CT my  interpretation is there is no acute findings.  Patient's been asymptomatic while at rest here.  Minimal symptoms with movement after meclizine and Zofran.  Suspect he will have some severe symptoms here and there but overall suspect that this is benign positional vertigo.  We will have him follow-up with ENT.  He understands return precautions.  Discharged in good condition.  This chart was dictated using voice recognition software.  Despite best efforts to proofread,  errors can occur which can change the documentation meaning.         Final Clinical Impression(s) / ED Diagnoses Final diagnoses:  Dizziness  Vertigo    Rx / DC Orders ED Discharge Orders          Ordered    meclizine (ANTIVERT) 25 MG tablet  3 times daily PRN        10/15/21 1231    ondansetron (ZOFRAN) 4 MG tablet  Every 6 hours        10/15/21 1231              Lennice Sites, DO 10/15/21 1231

## 2021-10-15 NOTE — ED Triage Notes (Signed)
States woke up at 1015 and rolled over to get out of bed and then the room started spinning. States laid back and it went back to normal, then happened again. Able to turn head without dizziness now. Denies complaints currently.

## 2021-10-15 NOTE — Discharge Instructions (Addendum)
Follow-up with ear nose and throat doctor.  Return if symptoms worsen.  Take meclizine and Zofran as prescribed for symptoms.

## 2021-10-17 ENCOUNTER — Other Ambulatory Visit: Payer: Self-pay | Admitting: Cardiology

## 2021-10-17 ENCOUNTER — Telehealth: Payer: Self-pay | Admitting: Family Medicine

## 2021-10-17 DIAGNOSIS — I480 Paroxysmal atrial fibrillation: Secondary | ICD-10-CM

## 2021-10-17 NOTE — Telephone Encounter (Signed)
Called pt and was advised if dizziness continues to go back ER or urgent care  Per Dr.Wendling. Pt has appt with Dr.Blyth 10/17

## 2021-10-17 NOTE — Telephone Encounter (Signed)
Pt called stating that they are still having issues with the dizziness he was having. Pt did have a visit to the ED on 10.4.23 for this dizziness and did have a Head CT done. Pt was given the following medications during that visit:  meclizine (ANTIVERT) 25 MG tablet [[id number]]   ondansetron (ZOFRAN) 4 MG tablet [[id number]]   Pt states he has not noticed any improvement and still experiences dizziness when he turns his head to the right. Pt states he had gotten up this morning and did so and almost passed out. Pt would like to know what would be the best course of action going forward. Please Advise.

## 2021-10-17 NOTE — Telephone Encounter (Signed)
Prescription refill request for Eliquis received. Indication:Afib  Last office visit: 09/30/21 ([place])  Scr: 1.94 (09/30/21)  Age: 76 Weight: 117kg  Appropriate dose and refill sent to requested pharmacy.

## 2021-10-20 ENCOUNTER — Other Ambulatory Visit (HOSPITAL_BASED_OUTPATIENT_CLINIC_OR_DEPARTMENT_OTHER): Payer: Self-pay | Admitting: Emergency Medicine

## 2021-10-20 ENCOUNTER — Other Ambulatory Visit (HOSPITAL_BASED_OUTPATIENT_CLINIC_OR_DEPARTMENT_OTHER): Payer: Self-pay

## 2021-10-21 ENCOUNTER — Other Ambulatory Visit (INDEPENDENT_AMBULATORY_CARE_PROVIDER_SITE_OTHER): Payer: Medicare Other

## 2021-10-21 DIAGNOSIS — E782 Mixed hyperlipidemia: Secondary | ICD-10-CM | POA: Diagnosis not present

## 2021-10-21 DIAGNOSIS — M1A9XX0 Chronic gout, unspecified, without tophus (tophi): Secondary | ICD-10-CM

## 2021-10-21 DIAGNOSIS — I10 Essential (primary) hypertension: Secondary | ICD-10-CM

## 2021-10-21 DIAGNOSIS — R739 Hyperglycemia, unspecified: Secondary | ICD-10-CM | POA: Diagnosis not present

## 2021-10-21 LAB — LIPID PANEL
Cholesterol: 154 mg/dL (ref 0–200)
HDL: 31.1 mg/dL — ABNORMAL LOW (ref >39.00)
LDL Cholesterol: 86 mg/dL (ref 0–99)
NonHDL: 122.58
Total CHOL/HDL Ratio: 5
Triglycerides: 182 mg/dL — ABNORMAL HIGH (ref 0.0–149.0)
VLDL: 36.4 mg/dL (ref 0.0–40.0)

## 2021-10-21 LAB — COMPREHENSIVE METABOLIC PANEL
ALT: 23 U/L (ref 0–53)
AST: 20 U/L (ref 0–37)
Albumin: 4 g/dL (ref 3.5–5.2)
Alkaline Phosphatase: 84 U/L (ref 39–117)
BUN: 12 mg/dL (ref 6–23)
CO2: 26 mEq/L (ref 19–32)
Calcium: 9.9 mg/dL (ref 8.4–10.5)
Chloride: 104 mEq/L (ref 96–112)
Creatinine, Ser: 2.02 mg/dL — ABNORMAL HIGH (ref 0.40–1.50)
GFR: 31.46 mL/min — ABNORMAL LOW (ref >60.00)
Glucose, Bld: 90 mg/dL (ref 70–99)
Potassium: 4.2 mEq/L (ref 3.5–5.1)
Sodium: 138 mEq/L (ref 135–145)
Total Bilirubin: 0.7 mg/dL (ref 0.2–1.2)
Total Protein: 6.6 g/dL (ref 6.0–8.3)

## 2021-10-21 LAB — CBC
HCT: 42.3 % (ref 39.0–52.0)
Hemoglobin: 14.2 g/dL (ref 13.0–17.0)
MCHC: 33.5 g/dL (ref 30.0–36.0)
MCV: 93.3 fl (ref 78.0–100.0)
Platelets: 210 10*3/uL (ref 150.0–400.0)
RBC: 4.54 Mil/uL (ref 4.22–5.81)
RDW: 14.7 % (ref 11.5–15.5)
WBC: 5.8 10*3/uL (ref 4.0–10.5)

## 2021-10-21 LAB — HEMOGLOBIN A1C: Hgb A1c MFr Bld: 5.9 % (ref 4.6–6.5)

## 2021-10-21 LAB — URIC ACID: Uric Acid, Serum: 9.5 mg/dL — ABNORMAL HIGH (ref 4.0–7.8)

## 2021-10-22 ENCOUNTER — Telehealth: Payer: Self-pay

## 2021-10-22 DIAGNOSIS — L738 Other specified follicular disorders: Secondary | ICD-10-CM | POA: Diagnosis not present

## 2021-10-22 DIAGNOSIS — N481 Balanitis: Secondary | ICD-10-CM | POA: Diagnosis not present

## 2021-10-22 DIAGNOSIS — L821 Other seborrheic keratosis: Secondary | ICD-10-CM | POA: Diagnosis not present

## 2021-10-22 LAB — TSH: TSH: 4.83 u[IU]/mL (ref 0.35–5.50)

## 2021-10-22 NOTE — Telephone Encounter (Signed)
Error

## 2021-10-28 ENCOUNTER — Ambulatory Visit (INDEPENDENT_AMBULATORY_CARE_PROVIDER_SITE_OTHER): Payer: Medicare Other | Admitting: Family Medicine

## 2021-10-28 VITALS — BP 144/68 | HR 55 | Temp 98.0°F | Resp 16 | Ht 71.0 in | Wt 255.0 lb

## 2021-10-28 DIAGNOSIS — R109 Unspecified abdominal pain: Secondary | ICD-10-CM | POA: Diagnosis not present

## 2021-10-28 DIAGNOSIS — Z1211 Encounter for screening for malignant neoplasm of colon: Secondary | ICD-10-CM

## 2021-10-28 DIAGNOSIS — R7989 Other specified abnormal findings of blood chemistry: Secondary | ICD-10-CM | POA: Diagnosis not present

## 2021-10-28 DIAGNOSIS — I10 Essential (primary) hypertension: Secondary | ICD-10-CM | POA: Diagnosis not present

## 2021-10-28 DIAGNOSIS — R945 Abnormal results of liver function studies: Secondary | ICD-10-CM | POA: Diagnosis not present

## 2021-10-28 DIAGNOSIS — N189 Chronic kidney disease, unspecified: Secondary | ICD-10-CM | POA: Diagnosis not present

## 2021-10-28 DIAGNOSIS — R739 Hyperglycemia, unspecified: Secondary | ICD-10-CM

## 2021-10-28 DIAGNOSIS — E782 Mixed hyperlipidemia: Secondary | ICD-10-CM

## 2021-10-28 DIAGNOSIS — F418 Other specified anxiety disorders: Secondary | ICD-10-CM

## 2021-10-28 MED ORDER — BUPROPION HCL ER (XL) 150 MG PO TB24: 150.0000 mg | ORAL_TABLET | Freq: Every day | ORAL | 3 refills | Status: DC

## 2021-10-28 MED ORDER — FLUOXETINE HCL 10 MG PO TABS: 10.0000 mg | ORAL_TABLET | Freq: Two times a day (BID) | ORAL | 0 refills | Status: DC

## 2021-10-28 NOTE — Progress Notes (Signed)
Subjective:   By signing my name below, I, David [name], attest that this documentation has been prepared under the direction and in the presence of David Canary, MD., 10/28/2021.     Patient ID: [name], male    DOB: October 22, 1945, 76 y.o.   MRN: [id number]  No chief complaint on file.  HPI Patient is in today for an office visit.  Abdominal Pain: He reports that he has been experiencing intermittent pain in his gallbladder and across his chest. He states that changing his eating habits and moving do not exacerbate the pain. He rates this pain as 4/10.   Anxiety/Depression: He currently takes Fluoxetine 10 mg and Lorazepam 1 mg to manage his anxiety/depression.   Back/Knee Pain: He reports that he has been experiencing back pain and bilateral knee pain.   Constipation: He reports that he has been experiencing constipation and has not had a bowel movement in 3 days. He denies having blood in his stool. He is requesting a prescription for a liquid laxative.   Hearing Loss: He is showing signs of hearing loss during today's visit. He says that hearing aids have not been effective for him.   Immunizations: He has been informed about receiving COVID-19, high-dose Flu, and RSV immunizations.   Prostate Cancer: He complains that he has been feeling fatigue since his prostate cancer radiation treatment.   Social History: He states that he drinks 2 glasses of whiskey daily.  Weight Loss: He expresses concern about his weight during today's visit and is interested in taking Bupropion to help him lose weight. Body mass index is 35.57 kg/m. Wt Readings from Last 3 Encounters:  10/28/21 255 lb (115.7 kg)  10/15/21 258 lb (117 kg)  09/30/21 257 lb 3.2 oz (116.7 kg)   Past Medical History:  Diagnosis Date   Adenoma of right adrenal gland 07/11/2002   2.4cm , noted on CT ABD   Anxiety    Arthritis of both knees 03/26/2016   Astigmatism    Back pain    BCC (basal cell  carcinoma of skin)    under right eye and right ear   Blood transfusion without reported diagnosis    Cataract 09/29/2016   Depression    Diverticulitis 2009   Diverticulosis    Family history of breast cancer    Family history of colon cancer    Fatty liver    GERD (gastroesophageal reflux disease)    Gout    Hearing loss of both ears 03/26/2016   History of atrial fibrillation    x 2 none since 2020   History of chronic prostatitis    started at age 65   History of kidney stones    Hyperglycemia    Hyperlipidemia    Hypertension    Incomplete right bundle branch block (RBBB)    Internal hemorrhoids    Kidney lesion 06/07/2015   Right midportion, 1.1x1.1 cm hyperechoic, noted on [place] ABD   LAFB (left anterior fascicular block)    Lipoma of axilla 09/2016   Liver lesion, right lobe 06/07/2015   2.5x2.4x2.4 cm hypoechoic lesion posterior aspect, noted on [place] ABD   Low back pain 03/26/2016   LVH (left ventricular hypertrophy) 08/06/2017   Moderate, Noted on ECHO   Medicare annual wellness visit, subsequent 03/12/2014   OA (osteoarthritis)    Back, Hands, Neck   Obesity 11/25/2007   Qualifier: Diagnosis of  By: Kriste Basque MD, Lonzo Cloud    Other malaise and fatigue  03/13/2013   Premature ventricular complex    Prostate cancer ([place]) dx 2021   Renal insufficiency    Kidney removed right   Scoliosis    Upper thoracic and lumbar   Sinus bradycardia 08/06/2017   Noted on EKG   Vitamin D deficiency    resolved   Past Surgical History:  Procedure Laterality Date   CARDIOVERSION  07/31/2017   CATARACT EXTRACTION, BILATERAL     COLON SURGERY  2009   segmental sigmoid resection   COLONOSCOPY     CYSTOSCOPY N/A 07/28/2019   Procedure: CYSTOSCOPY FLEXIBLE;  Surgeon: Cleon Gustin, MD;  Location: [facility];  Service: Urology;  Laterality: N/A;   CYSTOSCOPY W/ RETROGRADES Right 06/07/2018   Procedure: CYSTOSCOPY WITH RETROGRADE PYELOGRAM, uphrostogram;  Surgeon:  Cleon Gustin, MD;  Location: [facility];  Service: Urology;  Laterality: Right;   CYSTOSCOPY WITH URETEROSCOPY AND STENT PLACEMENT Right 02/28/2018   Procedure: CYSTOSCOPY WITH URETEROSCOPY [place] Se;  Surgeon: Kathie Rhodes, MD;  Location: [facility];  Service: Urology;  Laterality: Right;   CYSTOSCOPY/URETEROSCOPY/HOLMIUM LASER/STENT PLACEMENT Right 11/29/2017   Procedure: CYSTOSCOPY/RETROGRADE/URETEROSCOPY/HOLMIUM LASER/STENT PLACEMENT;  Surgeon: Kathie Rhodes, MD;  Location: [facility];  Service: Urology;  Laterality: Right;   EYE SURGERY Bilateral 01/12/2017   cataract removal   history of blood tranfusion  age 4   HOLMIUM LASER APPLICATION Right 07/27/9676   Procedure: HOLMIUM LASER APPLICATION;  Surgeon: Kathie Rhodes, MD;  Location: [facility];  Service: Urology;  Laterality: Right;   IR BALLOON DILATION URETERAL STRICTURE RIGHT  03/14/2018   IR NEPHRO TUBE REMOV/FL  03/17/2018   IR NEPHROSTOGRAM RIGHT THRU EXISTING ACCESS  03/17/2018   IR NEPHROSTOMY EXCHANGE RIGHT  03/14/2018   IR NEPHROSTOMY EXCHANGE RIGHT  06/21/2018   IR NEPHROSTOMY PLACEMENT RIGHT  03/02/2018   IR NEPHROSTOMY PLACEMENT RIGHT  04/20/2018   IR URETERAL STENT PLACEMENT EXISTING ACCESS RIGHT  03/14/2018   NOSE SURGERY     Submucous resection age 50   NUCLEAR STRESS TEST  06/03/2009   RADIOACTIVE SEED IMPLANT N/A 07/28/2019   Procedure: RADIOACTIVE SEED IMPLANT/BRACHYTHERAPY IMPLANT;  Surgeon: Cleon Gustin, MD;  Location[facility];  Service: Urology;  Laterality: N/A;  90 MINS   ROBOT ASSISTED LAPAROSCOPIC NEPHRECTOMY Right 08/04/2018   Procedure: XI ROBOTIC ASSISTED LAPAROSCOPIC NEPHRECTOMY;  Surgeon: Cleon Gustin, MD;  Location: [facility];  Service: Urology;  Laterality: Right;  3 HRS   ROBOT ASSISTED PYELOPLASTY Right 06/07/2018   Procedure: attempted XI ROBOTIC ASSISTED PYELOPLASTY, lysis of adhesions;  Surgeon: Cleon Gustin, MD;  Location: [facility];  Service:  Urology;  Laterality: Right;  3 HRS   SKIN BIOPSY     SPACE OAR INSTILLATION N/A 07/28/2019   Procedure: SPACE OAR INSTILLATION;  Surgeon: Cleon Gustin, MD;  Location: [facility];  Service: Urology;  Laterality: N/A;   URETEROSCOPY WITH HOLMIUM LASER LITHOTRIPSY Right 09/24/2017   Procedure: CYSTOSCOPY, URETEROSCOPY WITH HOLMIUM LASER LITHOTRIPSY, STENT PLACEMENT;  Surgeon: Kathie Rhodes, MD;  Location: [facility];  Service: Urology;  Laterality: Right;   Family History  Problem Relation Age of Onset   Heart disease Mother    Hypertension Mother    Stroke Mother    Colon cancer Mother 34   Breast cancer Mother 68   Heart disease Father        pacemaker   Aortic aneurysm Father    Hypertension Father    Lung cancer Father  79   Heart disease Sister    Atrial fibrillation Sister    Obesity Sister    Sleep apnea Sister    Heart attack Brother    Other Brother        muscle disease   Arthritis Brother    Stroke Brother    Atrial fibrillation Brother    Heart attack Brother    Atrial fibrillation Brother    Diabetes Brother    Atrial fibrillation Brother    Heart attack Brother    Hypertension Brother    Hyperlipidemia Brother    Heart attack Brother    Other Brother        heart valve operation   Atrial fibrillation Brother    Heart attack Maternal Grandmother    Diabetes Maternal Grandmother    Cancer Maternal Grandfather 44       hodgin's lymphoma   Heart attack Paternal Grandmother    Anxiety disorder Paternal Grandmother    Pneumonia Paternal Grandfather    Liver cancer Nephew 2       great nephew; d. 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   2 alcoholic beverages most days.   1 caffeinated beverage daily   No drug use no current tobacco use he is a prior smoker   12/24/2016   Social Determinants of Health   Financial Resource Strain: Low Risk  (02/20/2021)   Overall Financial Resource Strain (CARDIA)    Difficulty of Paying Living Expenses: Not hard at all  Food Insecurity: No Food Insecurity (02/20/2021)   Hunger Vital Sign    Worried About Running Out of Food in the Last Year: Never true    Ran Out of Food in the Last Year: Never true  Transportation Needs: No Transportation Needs (02/20/2021)   PRAPARE - Hydrologist (Medical): No    Lack of Transportation (Non-Medical): No  Physical Activity: Insufficiently Active (02/20/2021)   Exercise Vital Sign    Days of Exercise per Week: 5 days    Minutes of Exercise per Session: 20 min  Stress: Stress Concern Present (02/20/2021)   [place]    Feeling of Stress : To some extent  Social Connections: Moderately Isolated (02/20/2021)   Social  Connection and Isolation Panel [NHANES]    Frequency of Communication with Friends and Family: More than three times a week    Frequency of Social Gatherings with Friends and Family: Twice a week    Attends Religious Services: Never    Building surveyor or Organizations: No    Attends Archivist Meetings: Never    Marital Status: Married  Human resources officer Violence: Not At Risk (02/20/2021)   Humiliation, Afraid, Rape, and Kick questionnaire    Fear of Current or Ex-Partner: No    Emotionally Abused: No    Physically Abused: No    Sexually Abused: No   Outpatient Medications Prior to Visit  Medication Sig Dispense Refill   apixaban (ELIQUIS) 5 MG TABS tablet TAKE 1 TABLET BY MOUTH TWICE A DAY 60 tablet 5   ascorbic acid (VITAMIN C) 1000 MG tablet Take by mouth.     atenolol (TENORMIN) 50 MG tablet TAKE ONE TABLET BY MOUTH TWICE A DAY 180 tablet 1   atorvastatin (LIPITOR) 10 MG tablet TAKE ONE TABLET BY MOUTH EVERY MORNING 90 tablet 1   clotrimazole-betamethasone (LOTRISONE) lotion Apply topically 2 (two) times daily. For 2 weeks. (Patient taking differently: Apply topically daily. For 2 weeks.) 30 mL 0   colchicine 0.6 MG tablet For gout flares, take 1 tab po once then repeat in 1 hour as needed if pain persists up to a total of 3 doses 15 tablet 0   fluconazole (DIFLUCAN) 150 MG tablet Take 1 tablet (150 mg total) by mouth every other day. (Patient taking differently: Take 150 mg by mouth in the morning and at bedtime.) 3 tablet 1   FLUoxetine (PROZAC) 10 MG tablet Take 1 tablet (10 mg total) by mouth 3 (three) times daily. 270 tablet 1   fluticasone (FLONASE) 50 MCG/ACT nasal spray Place 2 sprays into both nostrils daily. 16 g 0   LORazepam (ATIVAN) 1 MG tablet TAKE ONE TABLET BY MOUTH TWICE A DAY AS NEEDED FOR ANXIETY OR SEDATION (Patient taking differently: 2 (two) times daily.) 180 tablet 1   meclizine (ANTIVERT) 25 MG tablet Take 1 tablet (25 mg total) by mouth 3 (three) times daily as needed for up to 30 doses for dizziness. 30 tablet 0   NYAMYC powder Apply topically.     omeprazole (PRILOSEC) 20 MG capsule Take 20 mg by mouth daily as needed (acid reflux).      ondansetron (ZOFRAN) 4 MG tablet Take 1 tablet  (4 mg total) by mouth every 6 (six) hours. 12 tablet 0   No facility-administered medications prior to visit.   Allergies  Allergen Reactions   Cefaclor Hives   Cephalosporins Hives   Penicillins Rash    Mild maculopapular rash Has patient had a PCN reaction causing immediate rash, facial/tongue/throat swelling, SOB or lightheadedness with hypotension: No Has patient had a PCN reaction causing severe rash involving mucus membranes or skin necrosis: No Has patient had a PCN reaction that required hospitalization: No Has patient had a PCN reaction occurring within the last 10 years: No If all of the above answers are "NO", then may proceed with Cephalosporin use.    Review of Systems  HENT:  Positive for hearing loss.   Gastrointestinal:  Positive for constipation.  Musculoskeletal:  Positive for back pain and joint pain (bilateral knee pain).  Neurological:        (+) Fatigue.      Objective:    Physical Exam Constitutional:  General: He is not in acute distress.    Appearance: Normal appearance. He is not ill-appearing.  HENT:     Head: Normocephalic and atraumatic.     Right Ear: Tympanic membrane, ear canal and external ear normal.     Left Ear: Tympanic membrane, ear canal and external ear normal.     Mouth/Throat:     Mouth: Mucous membranes are moist.     Pharynx: Oropharynx is clear.  Eyes:     Extraocular Movements: Extraocular movements intact.     Pupils: Pupils are equal, round, and reactive to light.  Cardiovascular:     Rate and Rhythm: Normal rate and regular rhythm.     Pulses: Normal pulses.     Heart sounds: Normal heart sounds. No murmur heard.    No gallop.  Pulmonary:     Effort: Pulmonary effort is normal. No respiratory distress.     Breath sounds: Normal breath sounds. No wheezing or rales.  Abdominal:     General: Bowel sounds are normal.  Skin:    General: Skin is warm and dry.  Neurological:     Mental Status: He is alert and oriented  to person, place, and time.  Psychiatric:        Mood and Affect: Mood normal.        Behavior: Behavior normal.        Judgment: Judgment normal.    There were no vitals taken for this visit. Wt Readings from Last 3 Encounters:  10/15/21 258 lb (117 kg)  09/30/21 257 lb 3.2 oz (116.7 kg)  07/22/21 260 lb 6.4 oz (118.1 kg)   Diabetic Foot Exam - Simple   No data filed    Lab Results  Component Value Date   WBC 5.8 10/21/2021   HGB 14.2 10/21/2021   HCT 42.3 10/21/2021   PLT 210.0 10/21/2021   GLUCOSE 90 10/21/2021   CHOL 154 10/21/2021   TRIG 182.0 (H) 10/21/2021   HDL 31.10 (L) 10/21/2021   LDLDIRECT 168.5 03/29/2006   LDLCALC 86 10/21/2021   ALT 23 10/21/2021   AST 20 10/21/2021   NA 138 10/21/2021   K 4.2 10/21/2021   CL 104 10/21/2021   CREATININE 2.02 (H) 10/21/2021   BUN 12 10/21/2021   CO2 26 10/21/2021   TSH 4.83 10/21/2021   PSA 4.01 (H) 07/16/2021   INR 1.0 07/25/2019   HGBA1C 5.9 10/21/2021   Lab Results  Component Value Date   TSH 4.83 10/21/2021   Lab Results  Component Value Date   WBC 5.8 10/21/2021   HGB 14.2 10/21/2021   HCT 42.3 10/21/2021   MCV 93.3 10/21/2021   PLT 210.0 10/21/2021   Lab Results  Component Value Date   NA 138 10/21/2021   K 4.2 10/21/2021   CO2 26 10/21/2021   GLUCOSE 90 10/21/2021   BUN 12 10/21/2021   CREATININE 2.02 (H) 10/21/2021   BILITOT 0.7 10/21/2021   ALKPHOS 84 10/21/2021   AST 20 10/21/2021   ALT 23 10/21/2021   PROT 6.6 10/21/2021   ALBUMIN 4.0 10/21/2021   CALCIUM 9.9 10/21/2021   ANIONGAP 7 03/11/2021   EGFR 35 (L) 09/30/2021   GFR 31.46 (L) 10/21/2021   Lab Results  Component Value Date   CHOL 154 10/21/2021   Lab Results  Component Value Date   HDL 31.10 (L) 10/21/2021   Lab Results  Component Value Date   LDLCALC 86 10/21/2021   Lab Results  Component Value Date  TRIG 182.0 (H) 10/21/2021   Lab Results  Component Value Date   CHOLHDL 5 10/21/2021   Lab Results   Component Value Date   HGBA1C 5.9 10/21/2021      Assessment & Plan:   Problem List Items Addressed This Visit   None  No orders of the defined types were placed in this encounter.  I, [name], personally preformed the services described in this documentation.  All medical record entries made by the scribe were at my direction and in my presence.  I have reviewed the chart and discharge instructions (if applicable) and agree that the record reflects my personal performance and is accurate and complete. 10/28/2021  I,Mohammed Iqbal,acting as a scribe for Penni Homans, MD.,have documented all relevant documentation on the behalf of Penni Homans, MD,as directed by  Penni Homans, MD while in the presence of Penni Homans, MD.  [name]

## 2021-10-28 NOTE — Patient Instructions (Addendum)
Encouraged increased hydration and fiber in diet. Daily probiotics. If bowels not moving can use Milk Of Magnesia 2 tbls by mouth RSV (respiratory syncitial virus) vaccine at pharmacy, Arexvy Covid booster when new version  At pharmacy  Constipation, Adult Constipation is when a person has fewer than three bowel movements in a week, has difficulty having a bowel movement, or has stools (feces) that are dry, hard, or larger than normal. Constipation may be caused by an underlying condition. It may become worse with age if a person takes certain medicines and does not take in enough fluids. Follow these instructions at home: Eating and drinking  Eat foods that have a lot of fiber, such as beans, whole grains, and fresh fruits and vegetables. Limit foods that are low in fiber and high in fat and processed sugars, such as fried or sweet foods. These include french fries, hamburgers, cookies, candies, and soda. Drink enough fluid to keep your urine pale yellow. General instructions Exercise regularly or as told by your health care provider. Try to do 150 minutes of moderate exercise each week. Use the bathroom when you have the urge to go. Do not hold it in. Take over-the-counter and prescription medicines only as told by your health care provider. This includes any fiber supplements. During bowel movements: Practice deep breathing while relaxing the lower abdomen. Practice pelvic floor relaxation. Watch your condition for any changes. Let your health care provider know about them. Keep all follow-up visits as told by your health care provider. This is important. Contact a health care provider if: You have pain that gets worse. You have a fever. You do not have a bowel movement after 4 days. You vomit. You are not hungry or you lose weight. You are bleeding from the opening between the buttocks (anus). You have thin, pencil-like stools. Get help right away if: You have a fever and your  symptoms suddenly get worse. You leak stool or have blood in your stool. Your abdomen is bloated. You have severe pain in your abdomen. You feel dizzy or you faint. Summary Constipation is when a person has fewer than three bowel movements in a week, has difficulty having a bowel movement, or has stools (feces) that are dry, hard, or larger than normal. Eat foods that have a lot of fiber, such as beans, whole grains, and fresh fruits and vegetables. Drink enough fluid to keep your urine pale yellow. Take over-the-counter and prescription medicines only as told by your health care provider. This includes any fiber supplements. This information is not intended to replace advice given to you by your health care provider. Make sure you discuss any questions you have with your health care provider. Document Revised: 11/16/2018 Document Reviewed: 11/16/2018 Elsevier Patient Education  [place].  in 4 oz of warm prune juice by mouth every 2-3 days. If no results then repeat in 4 hours with  Dulcolax suppository in bottom, may repeat again in 4 more hours as needed. Seek care if symptoms worsen. Consider daily Miralax and/or Dulcolax if symptoms persist.

## 2021-10-29 ENCOUNTER — Encounter: Payer: Self-pay | Admitting: *Deleted

## 2021-10-29 DIAGNOSIS — R109 Unspecified abdominal pain: Secondary | ICD-10-CM | POA: Insufficient documentation

## 2021-10-29 DIAGNOSIS — N189 Chronic kidney disease, unspecified: Secondary | ICD-10-CM | POA: Insufficient documentation

## 2021-10-29 LAB — COMPREHENSIVE METABOLIC PANEL
ALT: 25 U/L (ref 0–53)
AST: 20 U/L (ref 0–37)
Albumin: 4.4 g/dL (ref 3.5–5.2)
Alkaline Phosphatase: 83 U/L (ref 39–117)
BUN: 18 mg/dL (ref 6–23)
CO2: 26 mEq/L (ref 19–32)
Calcium: 10.6 mg/dL — ABNORMAL HIGH (ref 8.4–10.5)
Chloride: 103 mEq/L (ref 96–112)
Creatinine, Ser: 1.97 mg/dL — ABNORMAL HIGH (ref 0.40–1.50)
GFR: 32.41 mL/min — ABNORMAL LOW (ref >60.00)
Glucose, Bld: 87 mg/dL (ref 70–99)
Potassium: 4.4 mEq/L (ref 3.5–5.1)
Sodium: 136 mEq/L (ref 135–145)
Total Bilirubin: 0.7 mg/dL (ref 0.2–1.2)
Total Protein: 7 g/dL (ref 6.0–8.3)

## 2021-10-29 LAB — CBC WITH DIFFERENTIAL/PLATELET
Basophils Absolute: 0 10*3/uL (ref 0.0–0.1)
Basophils Relative: 0.6 % (ref 0.0–3.0)
Eosinophils Absolute: 0.3 10*3/uL (ref 0.0–0.7)
Eosinophils Relative: 4.2 % (ref 0.0–5.0)
HCT: 43.9 % (ref 39.0–52.0)
Hemoglobin: 14.6 g/dL (ref 13.0–17.0)
Lymphocytes Relative: 25.3 % (ref 12.0–46.0)
Lymphs Abs: 1.8 10*3/uL (ref 0.7–4.0)
MCHC: 33.2 g/dL (ref 30.0–36.0)
MCV: 94.2 fl (ref 78.0–100.0)
Monocytes Absolute: 0.7 10*3/uL (ref 0.1–1.0)
Monocytes Relative: 9.2 % (ref 3.0–12.0)
Neutro Abs: 4.4 10*3/uL (ref 1.4–7.7)
Neutrophils Relative %: 60.7 % (ref 43.0–77.0)
Platelets: 232 10*3/uL (ref 150.0–400.0)
RBC: 4.66 Mil/uL (ref 4.22–5.81)
RDW: 14.6 % (ref 11.5–15.5)
WBC: 7.2 10*3/uL (ref 4.0–10.5)

## 2021-10-29 LAB — AMYLASE: Amylase: 28 U/L (ref 27–131)

## 2021-10-29 LAB — T4, FREE: Free T4: 0.73 ng/dL (ref 0.60–1.60)

## 2021-10-29 LAB — SEDIMENTATION RATE: Sed Rate: 10 mm/hr (ref 0–20)

## 2021-10-29 LAB — LIPASE: Lipase: 25 U/L (ref 11.0–59.0)

## 2021-10-29 NOTE — Assessment & Plan Note (Signed)
Referred to [facility] for colonoscopy

## 2021-10-29 NOTE — Assessment & Plan Note (Signed)
Intermittent pain mostly in upper abdomen. Check labs and referred to gastroenterology.

## 2021-10-29 NOTE — Assessment & Plan Note (Signed)
Well controlled, no changes to meds. Encouraged heart healthy diet such as the DASH diet and exercise as tolerated.  °

## 2021-10-29 NOTE — Assessment & Plan Note (Signed)
Encourage heart healthy diet such as MIND or DASH diet, increase exercise, avoid trans fats, simple carbohydrates and processed foods, consider a krill or fish or flaxseed oil cap daily.  °

## 2021-10-29 NOTE — Assessment & Plan Note (Signed)
hgba1c acceptable, minimize simple carbs. Increase exercise as tolerated.  

## 2021-10-29 NOTE — Assessment & Plan Note (Signed)
Hydrate and monitor. Referred to nephrology.

## 2021-10-30 ENCOUNTER — Other Ambulatory Visit (HOSPITAL_BASED_OUTPATIENT_CLINIC_OR_DEPARTMENT_OTHER): Payer: Self-pay

## 2021-11-02 ENCOUNTER — Encounter: Payer: Self-pay | Admitting: Family Medicine

## 2021-11-03 ENCOUNTER — Other Ambulatory Visit: Payer: Self-pay | Admitting: Family Medicine

## 2021-11-03 MED ORDER — LORAZEPAM 1 MG PO TABS: 1.0000 mg | ORAL_TABLET | Freq: Three times a day (TID) | ORAL | 5 refills | Status: DC | PRN

## 2021-11-05 DIAGNOSIS — R3129 Other microscopic hematuria: Secondary | ICD-10-CM | POA: Diagnosis not present

## 2021-11-05 DIAGNOSIS — N183 Chronic kidney disease, stage 3 unspecified: Secondary | ICD-10-CM | POA: Diagnosis not present

## 2021-11-05 DIAGNOSIS — N1832 Chronic kidney disease, stage 3b: Secondary | ICD-10-CM | POA: Diagnosis not present

## 2021-11-05 DIAGNOSIS — I129 Hypertensive chronic kidney disease with stage 1 through stage 4 chronic kidney disease, or unspecified chronic kidney disease: Secondary | ICD-10-CM | POA: Diagnosis not present

## 2021-11-05 DIAGNOSIS — N200 Calculus of kidney: Secondary | ICD-10-CM | POA: Diagnosis not present

## 2021-11-07 ENCOUNTER — Ambulatory Visit (HOSPITAL_COMMUNITY): Payer: Medicare Other

## 2021-11-12 ENCOUNTER — Ambulatory Visit: Payer: Medicare Other | Admitting: Cardiology

## 2021-11-12 ENCOUNTER — Other Ambulatory Visit: Payer: Self-pay | Admitting: Family Medicine

## 2021-11-17 ENCOUNTER — Ambulatory Visit (HOSPITAL_COMMUNITY): Payer: Medicare Other

## 2021-11-17 ENCOUNTER — Ambulatory Visit (HOSPITAL_COMMUNITY)
Admission: RE | Admit: 2021-11-17 | Discharge: 2021-11-17 | Disposition: A | Discharge status: Home / Self Care | Payer: Medicare Other | Source: Ambulatory Visit | Attending: Cardiology | Admitting: Cardiology

## 2021-11-17 ENCOUNTER — Encounter (HOSPITAL_COMMUNITY): Payer: Self-pay

## 2021-11-17 DIAGNOSIS — I7121 Aneurysm of the ascending aorta, without rupture: Secondary | ICD-10-CM | POA: Diagnosis not present

## 2021-11-17 DIAGNOSIS — I719 Aortic aneurysm of unspecified site, without rupture: Secondary | ICD-10-CM | POA: Diagnosis not present

## 2021-11-17 DIAGNOSIS — I7789 Other specified disorders of arteries and arterioles: Secondary | ICD-10-CM | POA: Insufficient documentation

## 2021-11-17 LAB — POCT I-STAT CREATININE: Creatinine, Ser: 2.1 mg/dL — ABNORMAL HIGH (ref 0.61–1.24)

## 2021-11-17 MED ORDER — IOHEXOL 350 MG/ML SOLN
100.0000 mL | Freq: Once | INTRAVENOUS | Status: AC | PRN
  Administered 2021-11-17: 80 mL via INTRAVENOUS

## 2021-11-17 MED ORDER — SODIUM CHLORIDE (PF) 0.9 % IJ SOLN
INTRAMUSCULAR | Status: AC
  Filled 2021-11-17: qty 100

## 2021-11-19 NOTE — Progress Notes (Unsigned)
Cardiology Office Note   Date:  11/20/2021   ID:  David [name], David [name] 1945-02-19, MRN [id number]  PCP:  Mosie Lukes, MD  Cardiologist:   Minus Breeding, MD   Chief Complaint  Patient presents with   Atrial Fibrillation       History of Present Illness: David [name] is a 76 y.o. male who presents for follow up of atrial fib.  He has been seen in the Atrial Fib clinic.  He has had cardioversion.  He was in the ED with PAF a couple years ago.   Since then he has had no new cardiovascular complaints.  He thinks he would feel his atrial fibrillation and he has not.  He has not had any new urologic problems having had nephrectomy, prostate seed implants, kidney stones.   Since I last saw him he has done okay.  He has had no tachypalpitations to indicate that he is having atrial fibrillation.  He has had no further chest discomfort.  He has had no new shortness of breath, PND or orthopnea.  He said no palpitations, presyncope or syncope.  He did have a CT demonstrating a stable aortic dilatation of 43 mm.  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    Past Medical History:  Diagnosis Date   Adenoma of right adrenal gland 07/11/2002   2.4cm , noted on CT ABD   Anxiety    Arthritis of both knees 03/26/2016   Astigmatism    Back pain    BCC (basal cell carcinoma of skin)    under right eye and right ear   Blood transfusion without reported diagnosis    Cataract 09/29/2016   Depression    Diverticulitis 2009   Diverticulosis    Family history of breast cancer    Family history of colon cancer    Fatty liver    GERD (gastroesophageal reflux disease)    Gout    Hearing loss of both ears 03/26/2016   History of atrial fibrillation    x 2 none since 2020   History of chronic prostatitis    started at age 31   History of kidney stones    Hyperglycemia    Hyperlipidemia     Hypertension    Incomplete right bundle branch block (RBBB)    Internal hemorrhoids    Kidney lesion 06/07/2015   Right midportion, 1.1x1.1 cm hyperechoic, noted on [place] ABD   LAFB (left anterior fascicular block)    Lipoma of axilla 09/2016   Liver lesion, right lobe 06/07/2015   2.5x2.4x2.4 cm hypoechoic lesion posterior aspect, noted on [place] ABD   Low back pain 03/26/2016   LVH (left ventricular hypertrophy) 08/06/2017   Moderate, Noted on ECHO   Medicare annual wellness visit, subsequent 03/12/2014   OA (osteoarthritis)    Back, Hands, Neck   Obesity 11/25/2007   Qualifier: Diagnosis of  By: Lenna Gilford MD, Deborra Medina    Other malaise and fatigue 03/13/2013   Premature ventricular complex    Prostate cancer ([place]) dx 2021   Renal insufficiency    Kidney removed right   Scoliosis    Upper thoracic and lumbar   Sinus bradycardia 08/06/2017   Noted on EKG   Vitamin D deficiency    resolved    Past Surgical History:  Procedure Laterality Date   CARDIOVERSION  07/31/2017   CATARACT EXTRACTION, BILATERAL  COLON SURGERY  2009   segmental sigmoid resection   COLONOSCOPY     CYSTOSCOPY N/A 07/28/2019   Procedure: CYSTOSCOPY FLEXIBLE;  Surgeon: Cleon Gustin, MD;  Location: [facility];  Service: Urology;  Laterality: N/A;   CYSTOSCOPY W/ RETROGRADES Right 06/07/2018   Procedure: CYSTOSCOPY WITH RETROGRADE PYELOGRAM, uphrostogram;  Surgeon: Cleon Gustin, MD;  Location: [facility];  Service: Urology;  Laterality: Right;   CYSTOSCOPY WITH URETEROSCOPY AND STENT PLACEMENT Right 02/28/2018   Procedure: CYSTOSCOPY WITH URETEROSCOPY [place];  Surgeon: Kathie Rhodes, MD;  Location: [facility];  Service: Urology;  Laterality: Right;   CYSTOSCOPY/URETEROSCOPY/HOLMIUM LASER/STENT PLACEMENT Right 11/29/2017   Procedure: CYSTOSCOPY/RETROGRADE/URETEROSCOPY/HOLMIUM LASER/STENT PLACEMENT;  Surgeon: Kathie Rhodes, MD;  Location: [facility];  Service: Urology;   Laterality: Right;   EYE SURGERY Bilateral 01/12/2017   cataract removal   history of blood tranfusion  age 62   HOLMIUM LASER APPLICATION Right 7/61/9509   Procedure: HOLMIUM LASER APPLICATION;  Surgeon: Kathie Rhodes, MD;  Location: [facility];  Service: Urology;  Laterality: Right;   IR BALLOON DILATION URETERAL STRICTURE RIGHT  03/14/2018   IR NEPHRO TUBE REMOV/FL  03/17/2018   IR NEPHROSTOGRAM RIGHT THRU EXISTING ACCESS  03/17/2018   IR NEPHROSTOMY EXCHANGE RIGHT  03/14/2018   IR NEPHROSTOMY EXCHANGE RIGHT  06/21/2018   IR NEPHROSTOMY PLACEMENT RIGHT  03/02/2018   IR NEPHROSTOMY PLACEMENT RIGHT  04/20/2018   IR URETERAL STENT PLACEMENT EXISTING ACCESS RIGHT  03/14/2018   NOSE SURGERY     Submucous resection age 53   NUCLEAR STRESS TEST  06/03/2009   RADIOACTIVE SEED IMPLANT N/A 07/28/2019   Procedure: RADIOACTIVE SEED IMPLANT/BRACHYTHERAPY IMPLANT;  Surgeon: Cleon Gustin, MD;  Location: [facility];  Service: Urology;  Laterality: N/A;  90 MINS   ROBOT ASSISTED LAPAROSCOPIC NEPHRECTOMY Right 08/04/2018   Procedure: XI ROBOTIC ASSISTED LAPAROSCOPIC NEPHRECTOMY;  Surgeon: Cleon Gustin, MD;  Location: [facility];  Service: Urology;  Laterality: Right;  3 HRS   ROBOT ASSISTED PYELOPLASTY Right 06/07/2018   Procedure: attempted XI ROBOTIC ASSISTED PYELOPLASTY, lysis of adhesions;  Surgeon: Cleon Gustin, MD;  Location: [facility];  Service: Urology;  Laterality: Right;  3 HRS   SKIN BIOPSY     SPACE OAR INSTILLATION N/A 07/28/2019   Procedure: SPACE OAR INSTILLATION;  Surgeon: Cleon Gustin, MD;  Location: [facility];  Service: Urology;  Laterality: N/A;   URETEROSCOPY WITH HOLMIUM LASER LITHOTRIPSY Right 09/24/2017   Procedure: CYSTOSCOPY, URETEROSCOPY WITH HOLMIUM LASER LITHOTRIPSY, STENT PLACEMENT;  Surgeon: Kathie Rhodes, MD;  Location: [facility];  Service: Urology;  Laterality: Right;     Current Outpatient Medications   Medication Sig Dispense Refill   apixaban (ELIQUIS) 5 MG TABS tablet TAKE 1 TABLET BY MOUTH TWICE A DAY 60 tablet 5   atenolol (TENORMIN) 50 MG tablet Take 1 tablet (50 mg total) by mouth 2 (two) times daily. 60 tablet 0   atorvastatin (LIPITOR) 10 MG tablet TAKE ONE TABLET BY MOUTH EVERY MORNING 90 tablet 1   Cholecalciferol (VITAMIN D-1000 MAX ST) 25 MCG (1000 UT) tablet Take by mouth.     clotrimazole-betamethasone (LOTRISONE) lotion Apply topically 2 (two) times daily. For 2 weeks. (Patient taking differently: Apply topically daily. For 2 weeks.) 30 mL 0   colchicine 0.6 MG tablet For gout flares, take 1 tab po once then repeat in 1 hour as needed if pain persists up to a total of 3  doses 15 tablet 0   FLUoxetine (PROZAC) 10 MG tablet Take 1 tablet (10 mg total) by mouth in the morning and at bedtime. 180 tablet 0   fluticasone (FLONASE) 50 MCG/ACT nasal spray Place 2 sprays into both nostrils daily. 16 g 0   LORazepam (ATIVAN) 1 MG tablet Take 1 tablet (1 mg total) by mouth every 8 (eight) hours as needed for anxiety. 90 tablet 5   NYAMYC powder Apply topically.     omeprazole (PRILOSEC) 20 MG capsule Take 20 mg by mouth daily as needed (acid reflux).      ascorbic acid (VITAMIN C) 1000 MG tablet Take by mouth. (Patient not taking: Reported on 11/20/2021)     buPROPion (WELLBUTRIN XL) 150 MG 24 hr tablet Take 1 tablet (150 mg total) by mouth at bedtime. (Patient not taking: Reported on 11/20/2021) 30 tablet 3   meclizine (ANTIVERT) 25 MG tablet Take 1 tablet (25 mg total) by mouth 3 (three) times daily as needed for up to 30 doses for dizziness. (Patient not taking: Reported on 11/20/2021) 30 tablet 0   ondansetron (ZOFRAN) 4 MG tablet Take 1 tablet (4 mg total) by mouth every 6 (six) hours. (Patient not taking: Reported on 11/20/2021) 12 tablet 0   No current facility-administered medications for this visit.    Allergies:   Cefaclor, Cephalosporins, and Penicillins    ROS:  Please see the  history of present illness.   Otherwise, review of systems are positive for none.   All other systems are reviewed and negative.    PHYSICAL EXAM: VS:  BP 136/72 (BP Location: Left Arm, Patient Position: Sitting, Cuff Size: Large)   Pulse (!) 55   Ht '5\' 11"'$  (1.803 m)   Wt 248 lb (112.5 kg)   SpO2 95%   BMI 34.59 kg/m  , BMI Body mass index is 34.59 kg/m. GENERAL:  Well appearing NECK:  No jugular venous distention, waveform within normal limits, carotid upstroke brisk and symmetric, no bruits, no thyromegaly LUNGS:  Clear to auscultation bilaterally CHEST:  Unremarkable HEART:  PMI not displaced or sustained,S1 and S2 within normal limits, no S3, no S4, no clicks, no rubs, no murmurs ABD:  Flat, positive bowel sounds normal in frequency in pitch, no bruits, no rebound, no guarding, no midline pulsatile mass, no hepatomegaly, no splenomegaly EXT:  2 plus pulses throughout, no edema, no cyanosis no clubbing    EKG:  EKG is  ordered today. Sinus rhythm, rate 55, left axis deviation, poor anterior R wave progression, no acute ST-T wave changes.  Recent Labs: 12/20/2020: Magnesium 1.9 10/21/2021: TSH 4.83 10/28/2021: ALT 25; BUN 18; Hemoglobin 14.6; Platelets 232.0; Potassium 4.4; Sodium 136 11/17/2021: Creatinine, Ser 2.10    Lipid Panel    Component Value Date/Time   CHOL 154 10/21/2021 0942   TRIG 182.0 (H) 10/21/2021 0942   HDL 31.10 (L) 10/21/2021 0942   CHOLHDL 5 10/21/2021 0942   VLDL 36.4 10/21/2021 0942   LDLCALC 86 10/21/2021 0942   LDLCALC 107 (H) 12/20/2020 1426   LDLDIRECT 168.5 03/29/2006 1007      Wt Readings from Last 3 Encounters:  11/20/21 248 lb (112.5 kg)  10/28/21 255 lb (115.7 kg)  10/15/21 258 lb (117 kg)      Other studies Reviewed: Additional studies/ records that were reviewed today include: CT Review of the above records demonstrates:  See elsewhere   ASSESSMENT AND PLAN:  ATRIAL FIB:    David [name] has a CHA2DS2 -  VASc score of  2.  He had no symptomatic recurrences.  He is up-to-date with blood work and tolerates anticoagulation.  No change in therapy.   HTN: Blood pressure is at target.  No change in therapy.   CKD IIIA: His creatinine is 1.9 and stable.  He has is followed very closely by urology as he only has 1 kidney.   AORTIC ROOT ENLARGEMENT:   This was 43 mm earlier this month.  Other results as above and I will follow this up in 1 year.  I will probably repeat a CT of his abdomen in 2 years.  FATIGUE: I do not see a cardiac etiology to this.  No further work-up is indicated.  He says he does not snore.   Current medicines are reviewed at length with the patient today.  The patient does not have concerns regarding medicines.  The following changes have been made: None  Labs/ tests ordered today include: None  Orders Placed This Encounter  Procedures   CT ANGIO CHEST AORTA W/CM & OR WO/CM   Basic metabolic panel   EKG [id number]      Disposition:   FU with me in 12 months.     Signed, Minus Breeding, MD  11/20/2021 4:35 PM    Center

## 2021-11-20 ENCOUNTER — Other Ambulatory Visit: Payer: Self-pay

## 2021-11-20 ENCOUNTER — Ambulatory Visit: Payer: Medicare Other | Attending: Cardiology | Admitting: Cardiology

## 2021-11-20 ENCOUNTER — Encounter: Payer: Self-pay | Admitting: Cardiology

## 2021-11-20 VITALS — BP 136/72 | HR 55 | Ht 71.0 in | Wt 248.0 lb

## 2021-11-20 DIAGNOSIS — I10 Essential (primary) hypertension: Secondary | ICD-10-CM

## 2021-11-20 DIAGNOSIS — I7789 Other specified disorders of arteries and arterioles: Secondary | ICD-10-CM

## 2021-11-20 DIAGNOSIS — I480 Paroxysmal atrial fibrillation: Secondary | ICD-10-CM

## 2021-11-20 DIAGNOSIS — N1831 Chronic kidney disease, stage 3a: Secondary | ICD-10-CM | POA: Diagnosis not present

## 2021-11-20 NOTE — Patient Instructions (Signed)
Medication Instructions:  Continue same medications *If you need a refill on your cardiac medications before your next appointment, please call your pharmacy*   Lab Work: Will need a bmet to be done 1 week before chest ct   Lab order enclosed   Testing/Procedures: Schedule Chest Ct in 1 year  11/2022   Follow-Up: At [facility], you and your health needs are our priority.  As part of our continuing mission to provide you with exceptional heart care, we have created designated Provider Care Teams.  These Care Teams include your primary Cardiologist (physician) and Advanced Practice Providers (APPs -  Physician Assistants and Nurse Practitioners) who all work together to provide you with the care you need, when you need it.  We recommend signing up for the patient portal called "MyChart".  Sign up information is provided on this After Visit Summary.  MyChart is used to connect with patients for Virtual Visits (Telemedicine).  Patients are able to view lab/test results, encounter notes, upcoming appointments, etc.  Non-urgent messages can be sent to your provider as well.   To learn more about what you can do with MyChart, go to [email or web address].    Your next appointment:  1 year    Call in July to schedule Nov appointment     The format for your next appointment: Office   Provider:  Dr.Hochrein   Important Information About Sugar

## 2021-11-21 ENCOUNTER — Other Ambulatory Visit: Payer: Self-pay | Admitting: *Deleted

## 2021-11-21 DIAGNOSIS — N1831 Chronic kidney disease, stage 3a: Secondary | ICD-10-CM

## 2021-12-02 ENCOUNTER — Encounter: Payer: Self-pay | Admitting: *Deleted

## 2021-12-09 ENCOUNTER — Ambulatory Visit: Payer: Medicare Other | Admitting: Gastroenterology

## 2021-12-09 ENCOUNTER — Encounter: Payer: Self-pay | Admitting: Family Medicine

## 2021-12-10 ENCOUNTER — Other Ambulatory Visit: Payer: Self-pay | Admitting: Family Medicine

## 2021-12-10 ENCOUNTER — Encounter: Payer: Self-pay | Admitting: Family Medicine

## 2021-12-10 MED ORDER — FLUCONAZOLE 150 MG PO TABS: 150.0000 mg | ORAL_TABLET | ORAL | 1 refills | Status: DC

## 2021-12-10 NOTE — Progress Notes (Unsigned)
flu

## 2021-12-12 DIAGNOSIS — I4510 Unspecified right bundle-branch block: Secondary | ICD-10-CM | POA: Diagnosis not present

## 2021-12-12 DIAGNOSIS — R001 Bradycardia, unspecified: Secondary | ICD-10-CM | POA: Diagnosis not present

## 2021-12-12 DIAGNOSIS — Z79899 Other long term (current) drug therapy: Secondary | ICD-10-CM | POA: Diagnosis not present

## 2021-12-12 DIAGNOSIS — R079 Chest pain, unspecified: Secondary | ICD-10-CM | POA: Diagnosis not present

## 2021-12-12 DIAGNOSIS — I10 Essential (primary) hypertension: Secondary | ICD-10-CM | POA: Diagnosis not present

## 2021-12-12 DIAGNOSIS — R0789 Other chest pain: Secondary | ICD-10-CM | POA: Diagnosis not present

## 2021-12-12 DIAGNOSIS — Z7901 Long term (current) use of anticoagulants: Secondary | ICD-10-CM | POA: Diagnosis not present

## 2021-12-12 NOTE — ED Triage Notes (Signed)
Pain in R shoulder blade around to R chest that started around 2245 tonight. Pt took his BP at home and reading was 160/91. Denies pain right now. Initial pain lasted less than 10 minutes. Pt describes it as being dull and originating at R shoulderblade. Pt during triage denies pain, and states he feels "reasonably well". He states he did move some boxes and carry in groceries today.

## 2021-12-13 ENCOUNTER — Encounter (HOSPITAL_BASED_OUTPATIENT_CLINIC_OR_DEPARTMENT_OTHER): Payer: Self-pay | Admitting: Emergency Medicine

## 2021-12-13 ENCOUNTER — Emergency Department (HOSPITAL_BASED_OUTPATIENT_CLINIC_OR_DEPARTMENT_OTHER)
Admission: EM | Admit: 2021-12-13 | Discharge: 2021-12-13 | Disposition: A | Discharge status: Home / Self Care | Payer: Medicare Other | Attending: Emergency Medicine | Admitting: Emergency Medicine

## 2021-12-13 ENCOUNTER — Emergency Department (HOSPITAL_BASED_OUTPATIENT_CLINIC_OR_DEPARTMENT_OTHER): Payer: Medicare Other

## 2021-12-13 ENCOUNTER — Other Ambulatory Visit: Payer: Self-pay

## 2021-12-13 DIAGNOSIS — R0789 Other chest pain: Secondary | ICD-10-CM

## 2021-12-13 DIAGNOSIS — I10 Essential (primary) hypertension: Secondary | ICD-10-CM

## 2021-12-13 DIAGNOSIS — R079 Chest pain, unspecified: Secondary | ICD-10-CM | POA: Diagnosis not present

## 2021-12-13 LAB — CBC
HCT: 43.5 % (ref 39.0–52.0)
Hemoglobin: 14.5 g/dL (ref 13.0–17.0)
MCH: 30.9 pg (ref 26.0–34.0)
MCHC: 33.3 g/dL (ref 30.0–36.0)
MCV: 92.8 fL (ref 80.0–100.0)
Platelets: 193 K/uL (ref 150–400)
RBC: 4.69 MIL/uL (ref 4.22–5.81)
RDW: 13.5 % (ref 11.5–15.5)
WBC: 7.5 K/uL (ref 4.0–10.5)
nRBC: 0 % (ref 0.0–0.2)

## 2021-12-13 LAB — BASIC METABOLIC PANEL WITH GFR
Anion gap: 6 (ref 5–15)
BUN: 16 mg/dL (ref 8–23)
CO2: 26 mmol/L (ref 22–32)
Calcium: 9.9 mg/dL (ref 8.9–10.3)
Chloride: 105 mmol/L (ref 98–111)
Creatinine, Ser: 1.8 mg/dL — ABNORMAL HIGH (ref 0.61–1.24)
GFR, Estimated: 39 mL/min — ABNORMAL LOW (ref >60)
Glucose, Bld: 119 mg/dL — ABNORMAL HIGH (ref 70–99)
Potassium: 3.8 mmol/L (ref 3.5–5.1)
Sodium: 137 mmol/L (ref 135–145)

## 2021-12-13 LAB — TROPONIN I (HIGH SENSITIVITY)
Troponin I (High Sensitivity): 5 ng/L (ref <18)
Troponin I (High Sensitivity): 5 ng/L (ref <18)

## 2021-12-13 NOTE — ED Provider Notes (Signed)
[facility] EMERGENCY DEPARTMENT  Provider Note  CSN: [id number] Arrival date & time: 12/12/21 2314  History Chief Complaint  Patient presents with   Chest Pain    David [name] is a 76 y.o. male with multiple medical problems but no CAD reports he got up to go to bed around 2245hrs and noted what he describes as a muscle pain in his R scapular area that radiated to R chest. He checked his BP and it was elevated. The pain resolved after about 63mnutes but the BP did not improve as quickly as he would have liked and so he came to the ED for evaluation. He is currently asymptomatic.    Home Medications Prior to Admission medications   Medication Sig Start Date End Date Taking? Authorizing Provider  fluconazole (DIFLUCAN) 150 MG tablet Take 1 tablet (150 mg total) by mouth once a week. 12/10/21   BMosie Lukes MD  apixaban (ELIQUIS) 5 MG TABS tablet TAKE 1 TABLET BY MOUTH TWICE A DAY 10/17/21   HMinus Breeding MD  ascorbic acid (VITAMIN C) 1000 MG tablet Take by mouth. Patient not taking: Reported on 11/20/2021    [provider]  atenolol (TENORMIN) 50 MG tablet Take 1 tablet (50 mg total) by mouth 2 (two) times daily. 11/13/21   BMosie Lukes MD  atorvastatin (LIPITOR) 10 MG tablet TAKE ONE TABLET BY MOUTH EVERY MORNING 07/28/21   BMosie Lukes MD  buPROPion (WELLBUTRIN XL) 150 MG 24 hr tablet Take 1 tablet (150 mg total) by mouth at bedtime. Patient not taking: Reported on 11/20/2021 10/28/21   BMosie Lukes MD  Cholecalciferol (VITAMIN D-1000 MAX ST) 25 MCG (1000 UT) tablet Take by mouth.    [provider]  clotrimazole-betamethasone (LOTRISONE) lotion Apply topically 2 (two) times daily. For 2 weeks. Patient taking differently: Apply topically daily. For 2 weeks. 08/19/21   BMosie Lukes MD  colchicine 0.6 MG tablet For gout flares, take 1 tab po once then repeat in 1 hour as needed if pain persists up to a total of 3 doses 05/20/20   BMosie Lukes MD  FLUoxetine (PROZAC) 10 MG tablet Take 1 tablet (10 mg total) by mouth in the morning and at bedtime. 10/28/21   BMosie Lukes MD  fluticasone (FLONASE) 50 MCG/ACT nasal spray Place 2 sprays into both nostrils daily. 03/31/21   BMar Daring PA-C  LORazepam (ATIVAN) 1 MG tablet Take 1 tablet (1 mg total) by mouth every 8 (eight) hours as needed for anxiety. 11/03/21   BMosie Lukes MD  meclizine (ANTIVERT) 25 MG tablet Take 1 tablet (25 mg total) by mouth 3 (three) times daily as needed for up to 30 doses for dizziness. Patient not taking: Reported on 11/20/2021 10/15/21   CLennice Sites DO  NDan[facility] LPpowder Apply topically. 10/18/20   [provider]  omeprazole (PRILOSEC) 20 MG capsule Take 20 mg by mouth daily as needed (acid reflux).     [provider]  ondansetron (ZOFRAN) 4 MG tablet Take 1 tablet (4 mg total) by mouth every 6 (six) hours. Patient not taking: Reported on 11/20/2021 10/15/21   CLennice Sites DO     Allergies    Cefaclor, Cephalosporins, and Penicillins   Review of Systems   Review of Systems Please see HPI for pertinent positives and negatives  Physical Exam BP (!) 142/76   Pulse 60   Temp 98.2 F (36.8 C)   Resp 18  Ht '5\' 11"'$  (1.803 m)   Wt 112.9 kg   SpO2 94%   BMI 34.73 kg/m   Physical Exam Vitals and nursing note reviewed.  Constitutional:      Appearance: Normal appearance.  HENT:     Head: Normocephalic and atraumatic.     Nose: Nose normal.     Mouth/Throat:     Mouth: Mucous membranes are moist.  Eyes:     Extraocular Movements: Extraocular movements intact.     Conjunctiva/sclera: Conjunctivae normal.  Cardiovascular:     Rate and Rhythm: Normal rate.  Pulmonary:     Effort: Pulmonary effort is normal.     Breath sounds: Normal breath sounds.  Abdominal:     General: Abdomen is flat.     Palpations: Abdomen is soft.     Tenderness: There is no abdominal tenderness.  Musculoskeletal:         General: No swelling, tenderness or deformity. Normal range of motion.     Cervical back: Neck supple.  Skin:    General: Skin is warm and dry.  Neurological:     General: No focal deficit present.     Mental Status: He is alert.  Psychiatric:        Mood and Affect: Mood normal.     ED Results / Procedures / Treatments   EKG EKG Interpretation  Date/Time:  Friday December 12 2021 23:28:38 EST Ventricular Rate:  58 PR Interval:  156 QRS Duration: 94 QT Interval:  412 QTC Calculation: 404 R Axis:   -38 Text Interpretation: Sinus bradycardia Left axis deviation Incomplete right bundle branch block Cannot rule out Anterior infarct , age undetermined Abnormal ECG When compared with ECG of 15-Oct-2021 11:06, No significant change since last tracing Confirmed by Calvert Cantor [phone]) on 12/12/2021 11:31:14 PM  Procedures Procedures  Medications Ordered in the ED Medications - No data to display  Initial Impression and Plan  Patient here with atypical R chest/shoulder pain. He was mostly concerned about elevated BP readings at home. BP is back to his baseline here. EKG is unchanged from previous. Labs ordered in triage show normal CBC, BMP with CKD at baseline and normal initial Trop. I personally viewed the images from radiology studies and agree with radiologist interpretation: CXR is clear. Repeat Trop is pending.    ED Course   Clinical Course as of 12/13/21 0338  Sat Dec 13, 2021  1027 Repeat Trop remains normal. Patient has remained asymptomatic. Plan discharge home, continue home meds. Follow up with PCP and RTED for any other concerns.  [CS]    Clinical Course User Index [CS] Truddie Hidden, MD     MDM Rules/Calculators/A&P Medical Decision Making Given presenting complaint, I considered that admission might be necessary. After review of results from ED lab and/or imaging studies, admission to the hospital is not indicated at this time.    Problems  Addressed: Atypical chest pain: acute illness or injury Primary hypertension: chronic illness or injury  Amount and/or Complexity of Data Reviewed Labs: ordered. Decision-making details documented in ED Course. Radiology: ordered and independent interpretation performed. Decision-making details documented in ED Course. ECG/medicine tests: ordered and independent interpretation performed. Decision-making details documented in ED Course.  Risk Prescription drug management. Decision regarding hospitalization.    Final Clinical Impression(s) / ED Diagnoses Final diagnoses:  Primary hypertension  Atypical chest pain    Rx / DC Orders ED Discharge Orders     None  Truddie Hidden, MD 12/13/21 [phone]

## 2021-12-15 ENCOUNTER — Ambulatory Visit (INDEPENDENT_AMBULATORY_CARE_PROVIDER_SITE_OTHER): Payer: Medicare Other | Admitting: Family Medicine

## 2021-12-15 ENCOUNTER — Encounter: Payer: Self-pay | Admitting: Family Medicine

## 2021-12-15 VITALS — BP 146/79 | HR 76 | Resp 16 | Ht 72.0 in | Wt 247.2 lb

## 2021-12-15 DIAGNOSIS — J0110 Acute frontal sinusitis, unspecified: Secondary | ICD-10-CM | POA: Diagnosis not present

## 2021-12-15 DIAGNOSIS — H9313 Tinnitus, bilateral: Secondary | ICD-10-CM | POA: Diagnosis not present

## 2021-12-15 MED ORDER — DOXYCYCLINE HYCLATE 100 MG PO TABS: 100.0000 mg | ORAL_TABLET | Freq: Two times a day (BID) | ORAL | 0 refills | Status: AC

## 2021-12-15 NOTE — Patient Instructions (Signed)
Doxycycline for your sinus symptoms and cough.  Continue supportive measures including rest, hydration, humidifier use, steam showers, warm compresses to sinuses, warm liquids with lemon and honey, and over-the-counter cough, cold, and analgesics as needed.    Referral to ENT for chronic ringing in the ears

## 2021-12-15 NOTE — Progress Notes (Signed)
   Acute Office Visit  Subjective:     Patient ID: David [name], male    DOB: 07/04/1945, 76 y.o.   MRN: [id number]  Chief Complaint  Patient presents with   Shortness of Breath   Tinnitus   Dizziness    HPI Patient is in today for cough.  He went to the ED Saturday night for right back/chest pain, dyspnea. Workup unremarkable. He thinks the pain was related to some heavy lifting the days prior. Reports pain has completely resolved.  He is reporting a productive cough for the past week. No hemoptysis. Sputum has been "milky" looking. He has also had a headache, ear pressure/ringing, nasal congestion, sinus pressure. He denies any fevers, chills, nausea, vomiting, diarrhea. Reports he doesn't feel horrible, just various aches and pains chronic issues at baseline currently.    ROS All review of systems negative except what is listed in the HPI      Objective:    BP (!) 146/79   Pulse 76   Resp 16   Ht 6' (1.829 m)   Wt 247 lb 3.2 oz (112.1 kg)   SpO2 93%   BMI 33.53 kg/m    Physical Exam Vitals reviewed.  Constitutional:      Appearance: He is well-developed.  HENT:     Head: Normocephalic.  Cardiovascular:     Rate and Rhythm: Normal rate and regular rhythm.  Pulmonary:     Effort: Pulmonary effort is normal.     Breath sounds: Normal breath sounds.     Comments: Productive cough Musculoskeletal:     Cervical back: Normal range of motion and neck supple.  Skin:    General: Skin is warm and dry.  Neurological:     General: No focal deficit present.     Mental Status: He is alert and oriented to person, place, and time.  Psychiatric:        Mood and Affect: Mood normal.        Behavior: Behavior normal.     No results found for any visits on 12/15/21.      Assessment & Plan:   Problem List Items Addressed This Visit   None Visit Diagnoses     Tinnitus of both ears    -  Primary   Relevant Orders   Ambulatory referral to ENT   Acute  non-recurrent frontal sinusitis     Doxycycline for your sinus symptoms and cough.  Continue supportive measures including rest, hydration, humidifier use, steam showers, warm compresses to sinuses, warm liquids with lemon and honey, and over-the-counter cough, cold, and analgesics as needed.     Relevant Medications   doxycycline (VIBRA-TABS) 100 MG tablet       Meds ordered this encounter  Medications   doxycycline (VIBRA-TABS) 100 MG tablet    Sig: Take 1 tablet (100 mg total) by mouth 2 (two) times daily for 7 days.    Dispense:  14 tablet    Refill:  0    Order Specific Question:   Supervising Provider    Answer:   Mosie Lukes [id number]    Patient aware of signs/symptoms requiring further/urgent evaluation.   Return if symptoms worsen or fail to improve.  Terrilyn Saver, NP

## 2021-12-18 DIAGNOSIS — N1831 Chronic kidney disease, stage 3a: Secondary | ICD-10-CM | POA: Diagnosis not present

## 2021-12-18 LAB — BASIC METABOLIC PANEL
BUN/Creatinine Ratio: 11 (ref 10–24)
BUN: 20 mg/dL (ref 8–27)
CO2: 23 mmol/L (ref 20–29)
Calcium: 10 mg/dL (ref 8.6–10.2)
Chloride: 102 mmol/L (ref 96–106)
Creatinine, Ser: 1.86 mg/dL — ABNORMAL HIGH (ref 0.76–1.27)
Glucose: 90 mg/dL (ref 70–99)
Potassium: 4.1 mmol/L (ref 3.5–5.2)
Sodium: 138 mmol/L (ref 134–144)
eGFR: 37 mL/min/{1.73_m2} — ABNORMAL LOW (ref >59)

## 2021-12-19 ENCOUNTER — Other Ambulatory Visit: Payer: Self-pay

## 2021-12-19 ENCOUNTER — Other Ambulatory Visit: Payer: Self-pay | Admitting: Family Medicine

## 2021-12-19 MED ORDER — ATENOLOL 50 MG PO TABS: 50.0000 mg | ORAL_TABLET | Freq: Two times a day (BID) | ORAL | 0 refills | Status: DC

## 2021-12-24 DIAGNOSIS — N481 Balanitis: Secondary | ICD-10-CM | POA: Diagnosis not present

## 2021-12-28 ENCOUNTER — Other Ambulatory Visit: Payer: Self-pay

## 2021-12-28 ENCOUNTER — Encounter (HOSPITAL_BASED_OUTPATIENT_CLINIC_OR_DEPARTMENT_OTHER): Payer: Self-pay | Admitting: Emergency Medicine

## 2021-12-28 ENCOUNTER — Emergency Department (HOSPITAL_BASED_OUTPATIENT_CLINIC_OR_DEPARTMENT_OTHER)
Admission: EM | Admit: 2021-12-28 | Discharge: 2021-12-28 | Disposition: A | Discharge status: Home / Self Care | Payer: Medicare Other | Attending: Emergency Medicine | Admitting: Emergency Medicine

## 2021-12-28 ENCOUNTER — Emergency Department (HOSPITAL_BASED_OUTPATIENT_CLINIC_OR_DEPARTMENT_OTHER): Payer: Medicare Other

## 2021-12-28 DIAGNOSIS — I10 Essential (primary) hypertension: Secondary | ICD-10-CM | POA: Insufficient documentation

## 2021-12-28 DIAGNOSIS — Z8546 Personal history of malignant neoplasm of prostate: Secondary | ICD-10-CM | POA: Insufficient documentation

## 2021-12-28 DIAGNOSIS — R600 Localized edema: Secondary | ICD-10-CM | POA: Diagnosis not present

## 2021-12-28 DIAGNOSIS — Z85828 Personal history of other malignant neoplasm of skin: Secondary | ICD-10-CM | POA: Diagnosis not present

## 2021-12-28 DIAGNOSIS — M79672 Pain in left foot: Secondary | ICD-10-CM | POA: Insufficient documentation

## 2021-12-28 DIAGNOSIS — S99922A Unspecified injury of left foot, initial encounter: Secondary | ICD-10-CM | POA: Diagnosis not present

## 2021-12-28 NOTE — ED Provider Notes (Signed)
Emergency Department Provider Note   I have reviewed the triage vital signs and the nursing notes.   HISTORY  Chief Complaint Foot Pain   HPI David [name] is a 76 y.o. male past history reviewed below including prior history of gout and arthritis presents with left foot pain.  Pain in the left foot began in the past couple of days and is steadily worsened.  At rest, pain is 4 out of 10 but with ambulation pain increases.  He is hurting mainly to the bottom of the foot but has not noticed particular redness or swelling.  No known injury.  He does have arthritis in various locations and a remote history of gout.  He is tried Voltaren gel, IcyHot, compress but no particular relief.  No radiation of pain into the calf or knee.    Past Medical History:  Diagnosis Date   Adenoma of right adrenal gland 07/11/2002   2.4cm , noted on CT ABD   Anxiety    Arthritis of both knees 03/26/2016   Astigmatism    Back pain    BCC (basal cell carcinoma of skin)    under right eye and right ear   Blood transfusion without reported diagnosis    Cataract 09/29/2016   Depression    Diverticulitis 2009   Diverticulosis    Family history of breast cancer    Family history of colon cancer    Fatty liver    GERD (gastroesophageal reflux disease)    Gout    Hearing loss of both ears 03/26/2016   History of atrial fibrillation    x 2 none since 2020   History of chronic prostatitis    started at age 51   History of kidney stones    Hyperglycemia    Hyperlipidemia    Hypertension    Incomplete right bundle branch block (RBBB)    Internal hemorrhoids    Kidney lesion 06/07/2015   Right midportion, 1.1x1.1 cm hyperechoic, noted on [place] ABD   LAFB (left anterior fascicular block)    Lipoma of axilla 09/2016   Liver lesion, right lobe 06/07/2015   2.5x2.4x2.4 cm hypoechoic lesion posterior aspect, noted on [place] ABD   Low back pain 03/26/2016   LVH (left ventricular hypertrophy) 08/06/2017    Moderate, Noted on ECHO   Medicare annual wellness visit, subsequent 03/12/2014   OA (osteoarthritis)    Back, Hands, Neck   Obesity 11/25/2007   Qualifier: Diagnosis of  By: Lenna Gilford MD, Deborra Medina    Other malaise and fatigue 03/13/2013   Premature ventricular complex    Prostate cancer ([place]) dx 2021   Renal insufficiency    Kidney removed right   Scoliosis    Upper thoracic and lumbar   Sinus bradycardia 08/06/2017   Noted on EKG   Vitamin D deficiency    resolved    Review of Systems  Constitutional: No fever/chills Cardiovascular: Denies chest pain. Respiratory: Denies shortness of breath. Gastrointestinal: No abdominal pain.   Genitourinary: Negative for dysuria. Musculoskeletal: Positive left foot pain.  Skin: Negative for rash. Neurological: Negative for numbness/weakness.    ____________________________________________   PHYSICAL EXAM:  VITAL SIGNS: ED Triage Vitals  Enc Vitals Group     BP 12/28/21 1033 136/79     Pulse Rate 12/28/21 1033 (!) 55     Resp 12/28/21 1033 18     Temp 12/28/21 1033 98 F (36.7 C)     Temp Source 12/28/21 1033 Oral  SpO2 12/28/21 1033 97 %     Weight 12/28/21 1033 248 lb (112.5 kg)     Height 12/28/21 1033 '5\' 11"'$  (1.803 m)   Constitutional: Alert and oriented. Well appearing and in no acute distress. Eyes: Conjunctivae are normal.  Head: Atraumatic. Nose: No congestion/rhinnorhea. Mouth/Throat: Mucous membranes are moist.  Neck: No stridor.   Cardiovascular: Normal rate, regular rhythm. 2+ DP and PT pulses.  Respiratory: Normal respiratory effort.   Gastrointestinal: No distention.  Musculoskeletal: Tenderness mainly to the plantar aspect of the left foot over the fourth/fifth metatarsals.  There is no skin breakdown, erythema, ulceration.  No bruising to the dorsal aspect of the foot.  No joint erythema/warmth.  No tenderness into the calf or proximal fibular tenderness.  No asymmetric leg swelling. Neurologic:  Normal  speech and language. Normal sensation. Ambulatory with a cane.  Skin:  Skin is warm, dry and intact. No rash noted.  ____________________________________________  RADIOLOGY  No results found.  ____________________________________________   PROCEDURES  Procedure(s) performed:   Procedures  None  ____________________________________________   INITIAL IMPRESSION / ASSESSMENT AND PLAN / ED COURSE  Pertinent labs & imaging results that were available during my care of the patient were reviewed by me and considered in my medical decision making (see chart for details).   This patient is Presenting for Evaluation of foot pain, which does require a range of treatment options, and is a complaint that involves a moderate risk of morbidity and mortality.  The Differential Diagnoses include fracture, dislocation, gout, OA, sprain, contusion, etc.  Radiologic Tests Ordered, included foot x-ray. I independently interpreted the images and agree with radiology interpretation.   Medical Decision Making: Summary: ***  Reevaluation with update and discussion with   ***Considered admission***  Patient's presentation is most consistent with {EM COPA:27473}   Disposition:   ____________________________________________  FINAL CLINICAL IMPRESSION(S) / ED DIAGNOSES  Final diagnoses:  None     NEW OUTPATIENT MEDICATIONS STARTED DURING THIS VISIT:  New Prescriptions   No medications on file    Note:  This document was prepared using Dragon voice recognition software and may include unintentional dictation errors.  Nanda Quinton, MD, [facility] Emergency Medicine

## 2021-12-28 NOTE — Discharge Instructions (Signed)
The x-ray of your foot did not show any broken bones.  You do have arthritis and old changes from gout but at this time I am not seeing evidence of gout on your exam.  I would have you continue your home medications and I have listed the name for a podiatrist to have you follow-up.

## 2021-12-28 NOTE — ED Notes (Signed)
Portable xray at bedside.

## 2021-12-28 NOTE — ED Triage Notes (Signed)
Pt arrives pov, slow gait with cane, c/o LT foot pain, arch swelling and LLE discoloration and difficulty with ambulation

## 2021-12-29 ENCOUNTER — Encounter: Payer: Self-pay | Admitting: Family Medicine

## 2021-12-30 ENCOUNTER — Other Ambulatory Visit: Payer: Self-pay | Admitting: Family Medicine

## 2021-12-30 MED ORDER — METHYLPREDNISOLONE 4 MG PO TABS: ORAL_TABLET | ORAL | 0 refills | Status: DC

## 2022-01-07 ENCOUNTER — Telehealth: Payer: Self-pay | Admitting: Family Medicine

## 2022-01-07 ENCOUNTER — Other Ambulatory Visit: Payer: Self-pay | Admitting: Family Medicine

## 2022-01-07 MED ORDER — METHYLPREDNISOLONE 4 MG PO TABS: ORAL_TABLET | ORAL | 0 refills | Status: DC

## 2022-01-07 NOTE — Telephone Encounter (Signed)
Patient states blyth treated gout and sent in  predniSONE (DELTASONE) 20 MG tablet [[id number]]  ENDED  for  patient  Patient states he has finished this but however it is coming back due to holidays and a few things in his health/diet (red meat)  Patient is wanting a refill on prednisone    Pharmacy: [place].     Please advise

## 2022-01-19 ENCOUNTER — Ambulatory Visit: Payer: Medicare Other | Admitting: Gastroenterology

## 2022-01-22 ENCOUNTER — Telehealth: Payer: Self-pay | Admitting: Family Medicine

## 2022-01-22 ENCOUNTER — Other Ambulatory Visit: Payer: Self-pay | Admitting: Family Medicine

## 2022-01-22 NOTE — Telephone Encounter (Signed)
Refill was sent

## 2022-01-22 NOTE — Telephone Encounter (Signed)
Pt called to see if the following medication could be refilled. Pt is aware of note that was sent to pharmacy asking for an appt however he would like to see if it could be refilled without the visit as he is out.  Prescription Request  01/22/2022  Is this a "Controlled Substance" medicine? No  LOV: 10/28/2021  What is the name of the medication or equipment?   atenolol (TENORMIN) 50 MG tablet [[id number]]   Have you contacted your pharmacy to request a refill? Yes   Which pharmacy would you like this sent to?   [facility] [id number], [place] [address] [place] [address] Phone: [phone] Fax: [phone]  Patient notified that their request is being sent to the clinical staff for review and that they should receive a response within 2 business days.   Please advise at Mobile [phone] (mobile)

## 2022-01-23 ENCOUNTER — Other Ambulatory Visit: Payer: Self-pay | Admitting: Family Medicine

## 2022-01-23 MED ORDER — ATENOLOL 50 MG PO TABS: 50.0000 mg | ORAL_TABLET | Freq: Two times a day (BID) | ORAL | 5 refills | Status: DC

## 2022-01-26 ENCOUNTER — Other Ambulatory Visit (HOSPITAL_COMMUNITY): Payer: Self-pay | Admitting: Urology

## 2022-01-26 DIAGNOSIS — R972 Elevated prostate specific antigen [PSA]: Secondary | ICD-10-CM

## 2022-02-11 ENCOUNTER — Ambulatory Visit: Payer: Medicare Other | Admitting: Gastroenterology

## 2022-02-11 ENCOUNTER — Encounter: Payer: Self-pay | Admitting: Gastroenterology

## 2022-02-11 ENCOUNTER — Telehealth: Payer: Self-pay | Admitting: *Deleted

## 2022-02-11 VITALS — BP 140/82 | HR 60 | Ht 71.0 in | Wt 247.1 lb

## 2022-02-11 DIAGNOSIS — Z1211 Encounter for screening for malignant neoplasm of colon: Secondary | ICD-10-CM

## 2022-02-11 DIAGNOSIS — Z7901 Long term (current) use of anticoagulants: Secondary | ICD-10-CM

## 2022-02-11 NOTE — Telephone Encounter (Signed)
[facility] Pre-operative Risk Assessment     Request for surgical clearance:     Endoscopy Procedure  What type of surgery is being performed?     colonoscopy  When is this surgery scheduled?     Monday 02/16/22  What type of clearance is required ?   Pharmacy  Are there any medications that need to be held prior to surgery and how long? Eliquis 2 days  Practice name and name of physician performing surgery?      [facility]  What is your office phone and fax number?      Phone- [phone]  [phone]  Anesthesia type (None, local, MAC, general) ?       MAC

## 2022-02-11 NOTE — Patient Instructions (Addendum)
You have been scheduled for a colonoscopy. Please follow written instructions given to you at your visit today.  Please pick up your prep supplies at the pharmacy within the next 1-3 days. If you use inhalers (even only as needed), please bring them with you on the day of your procedure.  _______________________________________________________  If your blood pressure at your visit was 140/90 or greater, please contact your primary care physician to follow up on this.  _______________________________________________________  If you are age 14 or older, your body mass index should be between 23-30. Your Body mass index is 34.47 kg/m. If this is out of the aforementioned range listed, please consider follow up with your Primary Care Provider.  If you are age 41 or younger, your body mass index should be between 19-25. Your Body mass index is 34.47 kg/m. If this is out of the aformentioned range listed, please consider follow up with your Primary Care Provider.   ________________________________________________________  The [facility] providers would like to encourage you to use [facility] to communicate with providers for non-urgent requests or questions.  Due to long hold times on the telephone, sending your provider a message by [facility] may be a faster and more efficient way to get a response.  Please allow 48 business hours for a response.  Please remember that this is for non-urgent requests.  _______________________________________________________

## 2022-02-11 NOTE — Progress Notes (Signed)
02/11/2022 David [name] [id number] 02-06-1945   HISTORY OF PRESENT ILLNESS: This is a 77 year old male who is a patient of Dr. Celesta Aver.  He had history of diverticulitis requiring surgery several years ago.  His last colonoscopy was in 2009.  Referred by here on this occasion by his PCP, Dr. Charlett Blake, for colon cancer screening.  Says he really does not have many complaints.  He says that he has had some mild issues with constipation over the past couple years, but tries to eat lots of fruits and vegetables, drink lots of water, etc.  He is on Eliquis for history of atrial fibrillation and follows with Dr. Percival Spanish in regards to that.  Past Medical History:  Diagnosis Date   Adenoma of right adrenal gland 07/11/2002   2.4cm , noted on CT ABD   Anxiety    Arthritis of both knees 03/26/2016   Astigmatism    Back pain    BCC (basal cell carcinoma of skin)    under right eye and right ear   Blood transfusion without reported diagnosis    Cataract 09/29/2016   Depression    Diverticulitis 2009   Diverticulosis    Family history of breast cancer    Family history of colon cancer    Fatty liver    GERD (gastroesophageal reflux disease)    Gout    Hearing loss of both ears 03/26/2016   History of atrial fibrillation    x 2 none since 2020   History of chronic prostatitis    started at age 44   History of kidney stones    Hyperglycemia    Hyperlipidemia    Hypertension    Incomplete right bundle branch block (RBBB)    Internal hemorrhoids    Kidney lesion 06/07/2015   Right midportion, 1.1x1.1 cm hyperechoic, noted on [place] ABD   LAFB (left anterior fascicular block)    Lipoma of axilla 09/2016   Liver lesion, right lobe 06/07/2015   2.5x2.4x2.4 cm hypoechoic lesion posterior aspect, noted on [place] ABD   Low back pain 03/26/2016   LVH (left ventricular hypertrophy) 08/06/2017   Moderate, Noted on ECHO   Medicare annual wellness visit, subsequent 03/12/2014   OA  (osteoarthritis)    Back, Hands, Neck   Obesity 11/25/2007   Qualifier: Diagnosis of  By: Lenna Gilford MD, Deborra Medina    Other malaise and fatigue 03/13/2013   Premature ventricular complex    Prostate cancer ([place]) dx 2021   Renal insufficiency    Kidney removed right   Scoliosis    Upper thoracic and lumbar   Sinus bradycardia 08/06/2017   Noted on EKG   Vitamin D deficiency    resolved   Past Surgical History:  Procedure Laterality Date   CARDIOVERSION  07/31/2017   CATARACT EXTRACTION, BILATERAL     COLON SURGERY  2009   segmental sigmoid resection   COLONOSCOPY     CYSTOSCOPY N/A 07/28/2019   Procedure: CYSTOSCOPY FLEXIBLE;  Surgeon: Cleon Gustin, MD;  Location: [facility];  Service: Urology;  Laterality: N/A;   CYSTOSCOPY W/ RETROGRADES Right 06/07/2018   Procedure: CYSTOSCOPY WITH RETROGRADE PYELOGRAM, uphrostogram;  Surgeon: Cleon Gustin, MD;  Location: [facility];  Service: Urology;  Laterality: Right;   CYSTOSCOPY WITH URETEROSCOPY AND STENT PLACEMENT Right 02/28/2018   Procedure: CYSTOSCOPY WITH URETEROSCOPY [place];  Surgeon: Kathie Rhodes, MD;  Location[facility];  Service: Urology;  Laterality: Right;   CYSTOSCOPY/URETEROSCOPY/HOLMIUM LASER/STENT  PLACEMENT Right 11/29/2017   Procedure: CYSTOSCOPY/RETROGRADE/URETEROSCOPY/HOLMIUM LASER/STENT PLACEMENT;  Surgeon: Kathie Rhodes, MD;  Location: [facility];  Service: Urology;  Laterality: Right;   EYE SURGERY Bilateral 01/12/2017   cataract removal   history of blood tranfusion  age 78   HOLMIUM LASER APPLICATION Right 2/95/6213   Procedure: HOLMIUM LASER APPLICATION;  Surgeon: Kathie Rhodes, MD;  Location: [facility];  Service: Urology;  Laterality: Right;   IR BALLOON DILATION URETERAL STRICTURE RIGHT  03/14/2018   IR NEPHRO TUBE REMOV/FL  03/17/2018   IR NEPHROSTOGRAM RIGHT THRU EXISTING ACCESS  03/17/2018   IR NEPHROSTOMY EXCHANGE RIGHT  03/14/2018   IR NEPHROSTOMY EXCHANGE  RIGHT  06/21/2018   IR NEPHROSTOMY PLACEMENT RIGHT  03/02/2018   IR NEPHROSTOMY PLACEMENT RIGHT  04/20/2018   IR URETERAL STENT PLACEMENT EXISTING ACCESS RIGHT  03/14/2018   NOSE SURGERY     Submucous resection age 18   NUCLEAR STRESS TEST  06/03/2009   RADIOACTIVE SEED IMPLANT N/A 07/28/2019   Procedure: RADIOACTIVE SEED IMPLANT/BRACHYTHERAPY IMPLANT;  Surgeon: Cleon Gustin, MD;  Location: [facility];  Service: Urology;  Laterality: N/A;  90 MINS   ROBOT ASSISTED LAPAROSCOPIC NEPHRECTOMY Right 08/04/2018   Procedure: XI ROBOTIC ASSISTED LAPAROSCOPIC NEPHRECTOMY;  Surgeon: Cleon Gustin, MD;  Location: [facility];  Service: Urology;  Laterality: Right;  3 HRS   ROBOT ASSISTED PYELOPLASTY Right 06/07/2018   Procedure: attempted XI ROBOTIC ASSISTED PYELOPLASTY, lysis of adhesions;  Surgeon: Cleon Gustin, MD;  Location: [facility];  Service: Urology;  Laterality: Right;  3 HRS   SKIN BIOPSY     SPACE OAR INSTILLATION N/A 07/28/2019   Procedure: SPACE OAR INSTILLATION;  Surgeon: Cleon Gustin, MD;  Location: [facility];  Service: Urology;  Laterality: N/A;   URETEROSCOPY WITH HOLMIUM LASER LITHOTRIPSY Right 09/24/2017   Procedure: CYSTOSCOPY, URETEROSCOPY WITH HOLMIUM LASER LITHOTRIPSY, STENT PLACEMENT;  Surgeon: Kathie Rhodes, MD;  Location: [facility];  Service: Urology;  Laterality: Right;    reports that he quit smoking about 53 years ago. His smoking use included cigarettes. He started smoking about 53 years ago. He has a 6.00 pack-year smoking history. He has never used smokeless tobacco. He reports that he does not currently use alcohol. He reports that he does not use drugs. family history includes Anxiety disorder in his paternal grandmother; Aortic aneurysm in his father; Arthritis in his brother; Atrial fibrillation in his brother, brother, brother, brother, and sister; Breast cancer (age of onset: 41) in his mother; Cancer (age of  onset: 49) in his maternal grandfather; Colon cancer (age of onset: 67) in his mother; Diabetes in his brother and maternal grandmother; Heart attack in his brother, brother, brother, brother, maternal grandmother, and paternal grandmother; Heart disease in his father, mother, and sister; Hyperlipidemia in his brother; Hypertension in his brother, father, and mother; Liver cancer (age of onset: 2) in his nephew; Lung cancer (age of onset: 22) in his father; Lymphoma (age of onset: 23) in his niece; Obesity in his sister; Other in his brother and brother; Pneumonia in his paternal grandfather; Sleep apnea in his sister; Stroke in his brother and mother. Allergies  Allergen Reactions   Cefaclor Hives   Cephalosporins Hives   Penicillins Rash    Mild maculopapular rash Has patient had a PCN reaction causing immediate rash, facial/tongue/throat swelling, SOB or lightheadedness with hypotension: No Has patient had a PCN reaction causing severe rash involving mucus membranes or skin necrosis:  No Has patient had a PCN reaction that required hospitalization: No Has patient had a PCN reaction occurring within the last 10 years: No If all of the above answers are "NO", then may proceed with Cephalosporin use.       Outpatient Encounter Medications as of 02/11/2022  Medication Sig   apixaban (ELIQUIS) 5 MG TABS tablet TAKE 1 TABLET BY MOUTH TWICE A DAY   ascorbic acid (VITAMIN C) 1000 MG tablet Take by mouth.   atenolol (TENORMIN) 50 MG tablet Take 1 tablet (50 mg total) by mouth 2 (two) times daily.   atorvastatin (LIPITOR) 10 MG tablet TAKE ONE TABLET BY MOUTH EVERY MORNING   buPROPion (WELLBUTRIN XL) 150 MG 24 hr tablet Take 1 tablet (150 mg total) by mouth at bedtime.   Cholecalciferol (VITAMIN D-1000 MAX ST) 25 MCG (1000 UT) tablet Take by mouth.   clotrimazole-betamethasone (LOTRISONE) lotion Apply topically 2 (two) times daily. For 2 weeks. (Patient taking differently: Apply topically daily. For 2  weeks.)   colchicine 0.6 MG tablet For gout flares, take 1 tab po once then repeat in 1 hour as needed if pain persists up to a total of 3 doses   fluconazole (DIFLUCAN) 150 MG tablet Take 1 tablet (150 mg total) by mouth once a week.   FLUoxetine (PROZAC) 10 MG tablet Take 1 tablet (10 mg total) by mouth in the morning and at bedtime.   fluticasone (FLONASE) 50 MCG/ACT nasal spray Place 2 sprays into both nostrils daily.   LORazepam (ATIVAN) 1 MG tablet Take 1 tablet (1 mg total) by mouth every 8 (eight) hours as needed for anxiety.   meclizine (ANTIVERT) 25 MG tablet Take 1 tablet (25 mg total) by mouth 3 (three) times daily as needed for up to 30 doses for dizziness.   methylPREDNISolone (MEDROL) 4 MG tablet 5 tabs po x 1 day then 4 tabs po x 1 day then 3 tabs po x 1 day then 2 tabs po x 1 day then 1 tab po x 1 day and stop   NYAMYC powder Apply topically.   omeprazole (PRILOSEC) 20 MG capsule Take 20 mg by mouth daily as needed (acid reflux).    ondansetron (ZOFRAN) 4 MG tablet Take 1 tablet (4 mg total) by mouth every 6 (six) hours.   No facility-administered encounter medications on file as of 02/11/2022.    REVIEW OF SYSTEMS  : All other systems reviewed and negative except where noted in the History of Present Illness.   PHYSICAL EXAM: BP (!) 140/82   Pulse 60   Ht '5\' 11"'$  (1.803 m)   Wt 247 lb 2 oz (112.1 kg)   SpO2 96%   BMI 34.47 kg/m  General: Well developed white male in no acute distress Head: Normocephalic and atraumatic Eyes:  Sclerae anicteric, conjunctiva pink. Ears: Normal auditory acuity Lungs: Clear throughout to auscultation; no W/R/R. Heart: Regular rate and rhythm; no M/R/G. Abdomen: Soft, non-distended.  BS present.  Non-tender. Rectal:  Will be done at the time of colonoscopy. Musculoskeletal: Symmetrical with no gross deformities  Skin: No lesions on visible extremities Extremities: No edema  Neurological: Alert oriented x 4, grossly  non-focal Psychological:  Alert and cooperative. Normal mood and affect  ASSESSMENT AND PLAN: *CRC screening:  Last colonoscopy 2005.  ? Family history of colon cancer in his mother.  Will plan for colonoscopy with Dr. Carlean Purl. *Chronic anticoagulation with Eliquis for history of atrial fibrillation:  Will hold Eliquis for 2 days prior  to endoscopic procedures - will instruct when and how to resume after procedure. Benefits and risks of procedure explained including risks of bleeding, perforation, infection, missed lesions, reactions to medications and possible need for hospitalization and surgery for complications. Additional rare but real risk of stroke or other vascular clotting events off of Eliquis also explained and need to seek urgent help if any signs of these problems occur. Will communicate by phone or EMR with patient's prescribing provider, Dr. Percival Spanish, to confirm that holding Eliquis is reasonable in this case.    CC:  Mosie Lukes, MD

## 2022-02-11 NOTE — Telephone Encounter (Signed)
Patient with diagnosis of afib on Eliquis for anticoagulation.    Procedure: colonoscopy Date of procedure: 02/16/22  CHA2DS2-VASc Score = 4  This indicates a 4.8% annual risk of stroke. The patient's score is based upon: CHF History: 0 HTN History: 1 Diabetes History: 0 Stroke History: 0 Vascular Disease History: 1 Age Score: 2 Gender Score: 0   CrCl 90m/min using adjusted body weight Platelet count 193K  Per office protocol, patient can hold Eliquis for 2 days prior to procedure as requested.    **This guidance is not considered finalized until pre-operative APP has relayed final recommendations.**

## 2022-02-11 NOTE — Telephone Encounter (Signed)
Primary Cardiologist:James Hochrein, MD   Preoperative team, please contact this patient and set up a phone call appointment for further preoperative risk assessment. Please obtain consent and complete medication review. Thank you for your help. Late request for soon procedure - will need to get agreement from APP due to schedule already full.    I confirm that guidance regarding antiplatelet and oral anticoagulation therapy has been completed and, if necessary, noted below.   Emmaline Life, NP-C  02/11/2022, 4:42 PM [address] Office [phone] Fax [phone]

## 2022-02-12 NOTE — Telephone Encounter (Signed)
Left message for the pt to call back that he is going to need to be added onto pre op tomorrow ok per pre op app today. Left vm I will schedule the pt 02/13/22 @ 11am; need pt to call back and confirm he got my message.

## 2022-02-13 ENCOUNTER — Ambulatory Visit: Payer: Medicare Other | Attending: Cardiovascular Disease | Admitting: Physician Assistant

## 2022-02-13 DIAGNOSIS — Z01810 Encounter for preprocedural cardiovascular examination: Secondary | ICD-10-CM

## 2022-02-13 NOTE — Telephone Encounter (Signed)
Spoke with patient and confirmed instructions. Patient voiced understanding.

## 2022-02-13 NOTE — Telephone Encounter (Signed)
Mr. Mould has been cleared for upcoming colonoscopy procedure.  See separate note from today for cardiac clearance.

## 2022-02-13 NOTE — Progress Notes (Signed)
Virtual Visit via Telephone Note   Because of David [name] co-morbid illnesses, he is at least at moderate risk for complications without adequate follow up.  This format is felt to be most appropriate for this patient at this time.  The patient did not have access to video technology/had technical difficulties with video requiring transitioning to audio format only (telephone).  All issues noted in this document were discussed and addressed.  No physical exam could be performed with this format.  Please refer to the patient's chart for his consent to telehealth for [facility].  Evaluation Performed:  Preoperative cardiovascular risk assessment _____________   Date:  02/13/2022   Patient ID:  David [name], David [name] 1945-10-23, MRN [id number] Patient Location:  Home Provider location:   Office  Primary Care Provider:  Mosie Lukes, MD Primary Cardiologist:  Minus Breeding, MD  Chief Complaint / Patient Profile   77 y.o. y/o male with a h/o PAF, CKD stage III, hypertension and thoracic aortic aneurysm who is pending colonoscopy and presents today for telephonic preoperative cardiovascular risk assessment.  History of Present Illness    David [name] is a 78 y.o. male who presents via audio/video conferencing for a telehealth visit today.  Pt was last seen in cardiology clinic on 11/20/2021 by Dr. Percival Spanish.  At that time David [name] was doing well.  The patient is now pending procedure as outlined above. Since his last visit, he has been doing well without exertional chest pain or worsening dyspnea.  Past Medical History    Past Medical History:  Diagnosis Date   Adenoma of right adrenal gland 07/11/2002   2.4cm , noted on CT ABD   Anxiety    Arthritis of both knees 03/26/2016   Astigmatism    Back pain    BCC (basal cell carcinoma of skin)    under right eye and right ear   Blood transfusion without reported diagnosis    Cataract 09/29/2016    Depression    Diverticulitis 2009   Diverticulosis    Family history of breast cancer    Family history of colon cancer    Fatty liver    GERD (gastroesophageal reflux disease)    Gout    Hearing loss of both ears 03/26/2016   History of atrial fibrillation    x 2 none since 2020   History of chronic prostatitis    started at age 11   History of kidney stones    Hyperglycemia    Hyperlipidemia    Hypertension    Incomplete right bundle branch block (RBBB)    Internal hemorrhoids    Kidney lesion 06/07/2015   Right midportion, 1.1x1.1 cm hyperechoic, noted on [place] ABD   LAFB (left anterior fascicular block)    Lipoma of axilla 09/2016   Liver lesion, right lobe 06/07/2015   2.5x2.4x2.4 cm hypoechoic lesion posterior aspect, noted on [place] ABD   Low back pain 03/26/2016   LVH (left ventricular hypertrophy) 08/06/2017   Moderate, Noted on ECHO   Medicare annual wellness visit, subsequent 03/12/2014   OA (osteoarthritis)    Back, Hands, Neck   Obesity 11/25/2007   Qualifier: Diagnosis of  By: Lenna Gilford MD, Deborra Medina    Other malaise and fatigue 03/13/2013   Premature ventricular complex    Prostate cancer ([place]) dx 2021   Renal insufficiency    Kidney removed right   Scoliosis    Upper thoracic and lumbar   Sinus bradycardia  08/06/2017   Noted on EKG   Vitamin D deficiency    resolved   Past Surgical History:  Procedure Laterality Date   CARDIOVERSION  07/31/2017   CATARACT EXTRACTION, BILATERAL     COLON SURGERY  2009   segmental sigmoid resection   COLONOSCOPY     CYSTOSCOPY N/A 07/28/2019   Procedure: CYSTOSCOPY FLEXIBLE;  Surgeon: Cleon Gustin, MD;  Location[facility];  Service: Urology;  Laterality: N/A;   CYSTOSCOPY W/ RETROGRADES Right 06/07/2018   Procedure: CYSTOSCOPY WITH RETROGRADE PYELOGRAM, uphrostogram;  Surgeon: Cleon Gustin, MD;  Location: [facility];  Service: Urology;  Laterality: Right;   CYSTOSCOPY WITH URETEROSCOPY AND STENT  PLACEMENT Right 02/28/2018   Procedure: CYSTOSCOPY WITH URETEROSCOPY [place];  Surgeon: Kathie Rhodes, MD;  Location[facility];  Service: Urology;  Laterality: Right;   CYSTOSCOPY/URETEROSCOPY/HOLMIUM LASER/STENT PLACEMENT Right 11/29/2017   Procedure: CYSTOSCOPY/RETROGRADE/URETEROSCOPY/HOLMIUM LASER/STENT PLACEMENT;  Surgeon: Kathie Rhodes, MD;  Location: [facility];  Service: Urology;  Laterality: Right;   EYE SURGERY Bilateral 01/12/2017   cataract removal   history of blood tranfusion  age 73   HOLMIUM LASER APPLICATION Right 9/89/2119   Procedure: HOLMIUM LASER APPLICATION;  Surgeon: Kathie Rhodes, MD;  Location: [facility];  Service: Urology;  Laterality: Right;   IR BALLOON DILATION URETERAL STRICTURE RIGHT  03/14/2018   IR NEPHRO TUBE REMOV/FL  03/17/2018   IR NEPHROSTOGRAM RIGHT THRU EXISTING ACCESS  03/17/2018   IR NEPHROSTOMY EXCHANGE RIGHT  03/14/2018   IR NEPHROSTOMY EXCHANGE RIGHT  06/21/2018   IR NEPHROSTOMY PLACEMENT RIGHT  03/02/2018   IR NEPHROSTOMY PLACEMENT RIGHT  04/20/2018   IR URETERAL STENT PLACEMENT EXISTING ACCESS RIGHT  03/14/2018   NOSE SURGERY     Submucous resection age 22   NUCLEAR STRESS TEST  06/03/2009   RADIOACTIVE SEED IMPLANT N/A 07/28/2019   Procedure: RADIOACTIVE SEED IMPLANT/BRACHYTHERAPY IMPLANT;  Surgeon: Cleon Gustin, MD;  Location: [facility];  Service: Urology;  Laterality: N/A;  90 MINS   ROBOT ASSISTED LAPAROSCOPIC NEPHRECTOMY Right 08/04/2018   Procedure: XI ROBOTIC ASSISTED LAPAROSCOPIC NEPHRECTOMY;  Surgeon: Cleon Gustin, MD;  Location: [facility];  Service: Urology;  Laterality: Right;  3 HRS   ROBOT ASSISTED PYELOPLASTY Right 06/07/2018   Procedure: attempted XI ROBOTIC ASSISTED PYELOPLASTY, lysis of adhesions;  Surgeon: Cleon Gustin, MD;  Location: [facility];  Service: Urology;  Laterality: Right;  3 HRS   SKIN BIOPSY     SPACE OAR INSTILLATION N/A 07/28/2019   Procedure: SPACE OAR  INSTILLATION;  Surgeon: Cleon Gustin, MD;  Location[facility];  Service: Urology;  Laterality: N/A;   URETEROSCOPY WITH HOLMIUM LASER LITHOTRIPSY Right 09/24/2017   Procedure: CYSTOSCOPY, URETEROSCOPY WITH HOLMIUM LASER LITHOTRIPSY, STENT PLACEMENT;  Surgeon: Kathie Rhodes, MD;  Location: [facility];  Service: Urology;  Laterality: Right;    Allergies  Allergies  Allergen Reactions   Cefaclor Hives   Cephalosporins Hives   Penicillins Rash    Mild maculopapular rash Has patient had a PCN reaction causing immediate rash, facial/tongue/throat swelling, SOB or lightheadedness with hypotension: No Has patient had a PCN reaction causing severe rash involving mucus membranes or skin necrosis: No Has patient had a PCN reaction that required hospitalization: No Has patient had a PCN reaction occurring within the last 10 years: No If all of the above answers are "NO", then may proceed with Cephalosporin use.     Home Medications  Prior to Admission medications   Medication Sig Start Date End Date Taking? Authorizing Provider  apixaban (ELIQUIS) 5 MG TABS tablet TAKE 1 TABLET BY MOUTH TWICE A DAY 10/17/21   Minus Breeding, MD  ascorbic acid (VITAMIN C) 1000 MG tablet Take by mouth.    [provider]  atenolol (TENORMIN) 50 MG tablet Take 1 tablet (50 mg total) by mouth 2 (two) times daily. 01/23/22   Mosie Lukes, MD  atorvastatin (LIPITOR) 10 MG tablet TAKE ONE TABLET BY MOUTH EVERY MORNING 07/28/21   Mosie Lukes, MD  buPROPion (WELLBUTRIN XL) 150 MG 24 hr tablet Take 1 tablet (150 mg total) by mouth at bedtime. 10/28/21   Mosie Lukes, MD  Cholecalciferol (VITAMIN D-1000 MAX ST) 25 MCG (1000 UT) tablet Take by mouth.    [provider]  clotrimazole-betamethasone (LOTRISONE) lotion Apply topically 2 (two) times daily. For 2 weeks. Patient taking differently: Apply topically daily. For 2 weeks. 08/19/21   Mosie Lukes, MD   colchicine 0.6 MG tablet For gout flares, take 1 tab po once then repeat in 1 hour as needed if pain persists up to a total of 3 doses 05/20/20   Mosie Lukes, MD  fluconazole (DIFLUCAN) 150 MG tablet Take 1 tablet (150 mg total) by mouth once a week. 12/10/21   Mosie Lukes, MD  FLUoxetine (PROZAC) 10 MG tablet Take 1 tablet (10 mg total) by mouth in the morning and at bedtime. 10/28/21   Mosie Lukes, MD  fluticasone (FLONASE) 50 MCG/ACT nasal spray Place 2 sprays into both nostrils daily. 03/31/21   Mar Daring, PA-C  LORazepam (ATIVAN) 1 MG tablet Take 1 tablet (1 mg total) by mouth every 8 (eight) hours as needed for anxiety. 11/03/21   Mosie Lukes, MD  meclizine (ANTIVERT) 25 MG tablet Take 1 tablet (25 mg total) by mouth 3 (three) times daily as needed for up to 30 doses for dizziness. 10/15/21   Curatolo, Adam, DO  methylPREDNISolone (MEDROL) 4 MG tablet 5 tabs po x 1 day then 4 tabs po x 1 day then 3 tabs po x 1 day then 2 tabs po x 1 day then 1 tab po x 1 day and stop 01/07/22   Mosie Lukes, MD  [facility] powder Apply topically. 10/18/20   [provider]  omeprazole (PRILOSEC) 20 MG capsule Take 20 mg by mouth daily as needed (acid reflux).     [provider]  ondansetron (ZOFRAN) 4 MG tablet Take 1 tablet (4 mg total) by mouth every 6 (six) hours. 10/15/21   Lennice Sites, DO    Physical Exam    Vital Signs:  David [name] does not have vital signs available for review today.  Given telephonic nature of communication, physical exam is limited. AAOx3. NAD. Normal affect.  Speech and respirations are unlabored.  Accessory Clinical Findings    None  Assessment & Plan    1.  Preoperative Cardiovascular Risk Assessment:  -David [name] is a pleasant 77 year old male with past medical history of PAF.  He has been doing well from the cardiac perspective and denies any recent exertional chest pain or worsening dyspnea.  Our clinical pharmacist  reviewed his case and recommended to hold Eliquis for 2 days prior to the procedure and restart as soon as possible afterward at the surgeon's discretion.    The patient was advised that if he develops new symptoms prior to surgery to contact our  office to arrange for a follow-up visit, and he verbalized understanding.  A copy of this note will be routed to requesting surgeon.  Time:   Today, I have spent 5 minutes with the patient with telehealth technology discussing medical history, symptoms, and management plan.     [place], [place]  02/13/2022, 10:56 AM

## 2022-02-13 NOTE — Telephone Encounter (Signed)
Left message x 2 that he is going to need a tele visit for pre op clearance. I have tentatively put pt on today for 11 am telephone appt.   I will send FYI to requesting office as that the pt is going to need a tele appt for pre op clearance and to d/w his blood thinner. Pt has not returned our calls yet to confirm the appt today.

## 2022-02-15 ENCOUNTER — Encounter: Payer: Self-pay | Admitting: Certified Registered Nurse Anesthetist

## 2022-02-16 ENCOUNTER — Ambulatory Visit (AMBULATORY_SURGERY_CENTER): Payer: Medicare Other | Admitting: Internal Medicine

## 2022-02-16 ENCOUNTER — Encounter: Payer: Self-pay | Admitting: Internal Medicine

## 2022-02-16 VITALS — BP 136/81 | HR 50 | Temp 98.7°F | Resp 13 | Ht 71.0 in | Wt 247.0 lb

## 2022-02-16 DIAGNOSIS — D122 Benign neoplasm of ascending colon: Secondary | ICD-10-CM | POA: Diagnosis not present

## 2022-02-16 DIAGNOSIS — D123 Benign neoplasm of transverse colon: Secondary | ICD-10-CM | POA: Diagnosis not present

## 2022-02-16 DIAGNOSIS — Z1211 Encounter for screening for malignant neoplasm of colon: Secondary | ICD-10-CM | POA: Diagnosis not present

## 2022-02-16 DIAGNOSIS — Z86010 Personal history of colonic polyps: Secondary | ICD-10-CM

## 2022-02-16 DIAGNOSIS — Z860101 Personal history of adenomatous and serrated colon polyps: Secondary | ICD-10-CM

## 2022-02-16 HISTORY — DX: Personal history of colonic polyps: Z86.010

## 2022-02-16 HISTORY — DX: Personal history of adenomatous and serrated colon polyps: Z86.0101

## 2022-02-16 MED ORDER — SODIUM CHLORIDE 0.9 % IV SOLN: 500.0000 mL | Freq: Once | INTRAVENOUS | Status: DC

## 2022-02-16 NOTE — Op Note (Signed)
[facility] Patient Name: David [name] Procedure Date: 02/16/2022 3:44 PM MRN: [id number] Endoscopist: Gatha Mayer , MD, [id number] Age: 77 Referring MD:  Date of Birth: 02-16-1945 Gender: Male Account #: [id number] Procedure:                Colonoscopy Indications:              Screening for colorectal malignant neoplasm, Last                            colonoscopy: 2009 Medicines:                Monitored Anesthesia Care Procedure:                Pre-Anesthesia Assessment:                           - Prior to the procedure, a History and Physical                            was performed, and patient medications and                            allergies were reviewed. The patient's tolerance of                            previous anesthesia was also reviewed. The risks                            and benefits of the procedure and the sedation                            options and risks were discussed with the patient.                            All questions were answered, and informed consent                            was obtained. Prior Anticoagulants: The patient                            last took Eliquis (apixaban) 2 days prior to the                            procedure. ASA Grade Assessment: II - A patient                            with mild systemic disease. After reviewing the                            risks and benefits, the patient was deemed in                            satisfactory condition to undergo the procedure.  After obtaining informed consent, the colonoscope                            was passed under direct vision. Throughout the                            procedure, the patient's blood pressure, pulse, and                            oxygen saturations were monitored continuously. The                            CF [id number] #[id number] was introduced through the anus                            and advanced to the the cecum,  identified by                            appendiceal orifice and ileocecal valve. The                            colonoscopy was performed without difficulty. The                            patient tolerated the procedure well. The quality                            of the bowel preparation was good. The ileocecal                            valve, appendiceal orifice, and rectum were                            photographed. The bowel preparation used was                            Miralax via split dose instruction. Scope In: 3:46:00 PM Scope Out: 4:01:35 PM Scope Withdrawal Time: 0 hours 12 minutes 39 seconds  Total Procedure Duration: 0 hours 15 minutes 35 seconds  Findings:                 The perianal and digital rectal examinations were                            normal. Pertinent negatives include normal prostate                            (size, shape, and consistency).                           Three sessile polyps were found in the proximal                            transverse colon and ascending colon. The polyps  were diminutive in size. These polyps were removed                            with a cold snare. Resection and retrieval were                            complete. Verification of patient identification                            for the specimen was done. Estimated blood loss was                            minimal.                           Multiple large-mouthed diverticula were found in                            the entire colon.                           There was evidence of a prior end-to-end                            colo-colonic anastomosis in the distal sigmoid                            colon. This was patent and was characterized by                            healthy appearing mucosa. The anastomosis was                            traversed.                           The exam was otherwise without abnormality on                             direct and retroflexion views. Complications:            No immediate complications. Estimated Blood Loss:     Estimated blood loss was minimal. Impression:               - Three diminutive polyps in the proximal                            transverse colon and in the ascending colon,                            removed with a cold snare. Resected and retrieved.                           - Diverticulosis in the entire examined colon.                           -  Patent end-to-end colo-colonic anastomosis,                            characterized by healthy appearing mucosa.                           - The examination was otherwise normal on direct                            and retroflexion views. Recommendation:           - Patient has a contact number available for                            emergencies. The signs and symptoms of potential                            delayed complications were discussed with the                            patient. Return to normal activities tomorrow.                            Written discharge instructions were provided to the                            patient.                           - Resume previous diet.                           - Continue present medications.                           - Resume Eliquis (apixaban) at prior dose tomorrow.                           - No recommendation at this time regarding repeat                            colonoscopy due to age.                           - Await pathology results. Gatha Mayer, MD 02/16/2022 4:10:36 PM This report has been signed electronically.

## 2022-02-16 NOTE — Patient Instructions (Addendum)
I found and removed three tiny polyps. All look benign.  I saw your diverticulosis and area of prior surgery also - thickened muscle rings and pouches in the colon wall. Please read the handout about this condition.  I will let you know pathology results and if to have another routine colonoscopy by mail and/or My Chart.  Restart Eliquis tomorrow.  I appreciate the opportunity to care for you. Gatha Mayer, MD, [facility]  Handouts on diverticulitis and polyps given.   YOU HAD AN ENDOSCOPIC PROCEDURE TODAY AT [facility]:   Refer to the procedure report that was given to you for any specific questions about what was found during the examination.  If the procedure report does not answer your questions, please call your gastroenterologist to clarify.  If you requested that your care partner not be given the details of your procedure findings, then the procedure report has been included in a sealed envelope for you to review at your convenience later.  YOU SHOULD EXPECT: Some feelings of bloating in the abdomen. Passage of more gas than usual.  Walking can help get rid of the air that was put into your GI tract during the procedure and reduce the bloating. If you had a lower endoscopy (such as a colonoscopy or flexible sigmoidoscopy) you may notice spotting of blood in your stool or on the toilet paper. If you underwent a bowel prep for your procedure, you may not have a normal bowel movement for a few days.  Please Note:  You might notice some irritation and congestion in your nose or some drainage.  This is from the oxygen used during your procedure.  There is no need for concern and it should clear up in a day or so.  SYMPTOMS TO REPORT IMMEDIATELY:  Following lower endoscopy (colonoscopy or flexible sigmoidoscopy):  Excessive amounts of blood in the stool  Significant tenderness or worsening of abdominal pains  Swelling of the abdomen that is new, acute  Fever of 100F or  higher  For urgent or emergent issues, a gastroenterologist can be reached at any hour by calling [phone]. Do not use MyChart messaging for urgent concerns.   DIET:  We do recommend a small meal at first, but then you may proceed to your regular diet.  Drink plenty of fluids but you should avoid alcoholic beverages for 24 hours.  ACTIVITY:  You should plan to take it easy for the rest of today and you should NOT DRIVE or use heavy machinery until tomorrow (because of the sedation medicines used during the test).    FOLLOW UP: Our staff will call the number listed on your records the next business day following your procedure.  We will call around 7:15- 8:00 am to check on you and address any questions or concerns that you may have regarding the information given to you following your procedure. If we do not reach you, we will leave a message.     If any biopsies were taken you will be contacted by phone or by letter within the next 1-3 weeks.  Please call us at [phone] if you have not heard about the biopsies in 3 weeks.   SIGNATURES/CONFIDENTIALITY: You and/or your care partner have signed paperwork which will be entered into your electronic medical record.  These signatures attest to the fact that that the information above on your After Visit Summary has been reviewed and is understood.  Full responsibility of the confidentiality of this  discharge information lies with you and/or your care-partner.

## 2022-02-16 NOTE — Progress Notes (Signed)
Called to room to assist during endoscopic procedure.  Patient ID and intended procedure confirmed with present staff. Received instructions for my participation in the procedure from the performing physician.  

## 2022-02-16 NOTE — Progress Notes (Unsigned)
History and Physical Interval Note:  02/16/2022 3:39 PM  David [name]  has presented today for endoscopic procedure(s), with the diagnosis of  Encounter Diagnosis  Name Primary?   Colon cancer screening Yes  .  The various methods of evaluation and treatment have been discussed with the patient and/or family. After consideration of risks, benefits and other options for treatment, the patient has consented to  the endoscopic procedure(s).   The patient's history has been reviewed, patient examined, no change in status, stable for endoscopic procedure(s).  I have reviewed the patient's chart and labs.  Questions were answered to the patient's satisfaction.     Gatha Mayer, MD, [facility]

## 2022-02-16 NOTE — Progress Notes (Unsigned)
Pt's states no medical or surgical changes since previsit or office visit. 

## 2022-02-16 NOTE — Progress Notes (Unsigned)
Report given to PACU, vss 

## 2022-02-17 ENCOUNTER — Telehealth: Payer: Self-pay

## 2022-02-17 NOTE — Telephone Encounter (Signed)
  Follow up Call-     02/16/2022    2:42 PM  Call back number  Post procedure Call Back phone  # [phone]  Permission to leave phone message Yes     Patient questions:  Do you have a fever, pain , or abdominal swelling? No. Pain Score  0 *  Have you tolerated food without any problems? Yes.    Have you been able to return to your normal activities? Yes.    Do you have any questions about your discharge instructions: Diet   No. Medications  No. Follow up visit  No.  Do you have questions or concerns about your Care? No.  Actions: * If pain score is 4 or above: No action needed, pain <4.

## 2022-02-22 ENCOUNTER — Encounter: Payer: Self-pay | Admitting: Internal Medicine

## 2022-02-26 ENCOUNTER — Ambulatory Visit (INDEPENDENT_AMBULATORY_CARE_PROVIDER_SITE_OTHER): Payer: Medicare Other | Admitting: *Deleted

## 2022-02-26 VITALS — BP 134/70 | HR 54 | Ht 71.0 in | Wt 247.8 lb

## 2022-02-26 DIAGNOSIS — Z0000 Encounter for general adult medical examination without abnormal findings: Secondary | ICD-10-CM | POA: Diagnosis not present

## 2022-02-26 NOTE — Patient Instructions (Signed)
David [name] , Thank you for taking time to come for your Medicare Wellness Visit. I appreciate your ongoing commitment to your health goals. Please review the following plan we discussed and let me know if I can assist you in the future.   These are the goals we discussed:  Goals      DIET - INCREASE WATER INTAKE     Increase physical activity        This is a list of the screening recommended for you and due dates:  Health Maintenance  Topic Date Due   Zoster (Shingles) Vaccine (1 of 2) Never done   COVID-19 Vaccine (5 - 2023-24 season) 09/12/2021   Flu Shot  04/12/2022*   DTaP/Tdap/Td vaccine (2 - Td or Tdap) 09/06/2022   Medicare Annual Wellness Visit  02/27/2023   Pneumonia Vaccine  Completed   Hepatitis C Screening: USPSTF Recommendation to screen - Ages 18-79 yo.  Completed   HPV Vaccine  Aged Out   Colon Cancer Screening  Discontinued  *Topic was postponed. The date shown is not the original due date.     Next appointment: Follow up in one year for your annual wellness visit.   Preventive Care 65 Years and Older, Male Preventive care refers to lifestyle choices and visits with your health care provider that can promote health and wellness. What does preventive care include? A yearly physical exam. This is also called an annual well check. Dental exams once or twice a year. Routine eye exams. Ask your health care provider how often you should have your eyes checked. Personal lifestyle choices, including: Daily care of your teeth and gums. Regular physical activity. Eating a healthy diet. Avoiding tobacco and drug use. Limiting alcohol use. Practicing safe sex. Taking low doses of aspirin every day. Taking vitamin and mineral supplements as recommended by your health care provider. What happens during an annual well check? The services and screenings done by your health care provider during your annual well check will depend on your age, overall health, lifestyle risk  factors, and family history of disease. Counseling  Your health care provider may ask you questions about your: Alcohol use. Tobacco use. Drug use. Emotional well-being. Home and relationship well-being. Sexual activity. Eating habits. History of falls. Memory and ability to understand (cognition). Work and work Statistician. Screening  You may have the following tests or measurements: Height, weight, and BMI. Blood pressure. Lipid and cholesterol levels. These may be checked every 5 years, or more frequently if you are over 49 years old. Skin check. Lung cancer screening. You may have this screening every year starting at age 57 if you have a 30-pack-year history of smoking and currently smoke or have quit within the past 15 years. Fecal occult blood test (FOBT) of the stool. You may have this test every year starting at age 24. Flexible sigmoidoscopy or colonoscopy. You may have a sigmoidoscopy every 5 years or a colonoscopy every 10 years starting at age 70. Prostate cancer screening. Recommendations will vary depending on your family history and other risks. Hepatitis C blood test. Hepatitis B blood test. Sexually transmitted disease (STD) testing. Diabetes screening. This is done by checking your blood sugar (glucose) after you have not eaten for a while (fasting). You may have this done every 1-3 years. Abdominal aortic aneurysm (AAA) screening. You may need this if you are a current or former smoker. Osteoporosis. You may be screened starting at age 73 if you are at high risk. Talk  with your health care provider about your test results, treatment options, and if necessary, the need for more tests. Vaccines  Your health care provider may recommend certain vaccines, such as: Influenza vaccine. This is recommended every year. Tetanus, diphtheria, and acellular pertussis (Tdap, Td) vaccine. You may need a Td booster every 10 years. Zoster vaccine. You may need this after age  59. Pneumococcal 13-valent conjugate (PCV13) vaccine. One dose is recommended after age 36. Pneumococcal polysaccharide (PPSV23) vaccine. One dose is recommended after age 40. Talk to your health care provider about which screenings and vaccines you need and how often you need them. This information is not intended to replace advice given to you by your health care provider. Make sure you discuss any questions you have with your health care provider. Document Released: 01/25/2015 Document Revised: 09/18/2015 Document Reviewed: 10/30/2014 Elsevier Interactive Patient Education  2017 [employer] Prevention in the Home Falls can cause injuries. They can happen to people of all ages. There are many things you can do to make your home safe and to help prevent falls. What can I do on the outside of my home? Regularly fix the edges of walkways and driveways and fix any cracks. Remove anything that might make you trip as you walk through a door, such as a raised step or threshold. Trim any bushes or trees on the path to your home. Use bright outdoor lighting. Clear any walking paths of anything that might make someone trip, such as rocks or tools. Regularly check to see if handrails are loose or broken. Make sure that both sides of any steps have handrails. Any raised decks and porches should have guardrails on the edges. Have any leaves, snow, or ice cleared regularly. Use sand or salt on walking paths during winter. Clean up any spills in your garage right away. This includes oil or grease spills. What can I do in the bathroom? Use night lights. Install grab bars by the toilet and in the tub and shower. Do not use towel bars as grab bars. Use non-skid mats or decals in the tub or shower. If you need to sit down in the shower, use a plastic, non-slip stool. Keep the floor dry. Clean up any water that spills on the floor as soon as it happens. Remove soap buildup in the tub or shower  regularly. Attach bath mats securely with double-sided non-slip rug tape. Do not have throw rugs and other things on the floor that can make you trip. What can I do in the bedroom? Use night lights. Make sure that you have a light by your bed that is easy to reach. Do not use any sheets or blankets that are too big for your bed. They should not hang down onto the floor. Have a firm chair that has side arms. You can use this for support while you get dressed. Do not have throw rugs and other things on the floor that can make you trip. What can I do in the kitchen? Clean up any spills right away. Avoid walking on wet floors. Keep items that you use a lot in easy-to-reach places. If you need to reach something above you, use a strong step stool that has a grab bar. Keep electrical cords out of the way. Do not use floor polish or wax that makes floors slippery. If you must use wax, use non-skid floor wax. Do not have throw rugs and other things on the floor that can make you  trip. What can I do with my stairs? Do not leave any items on the stairs. Make sure that there are handrails on both sides of the stairs and use them. Fix handrails that are broken or loose. Make sure that handrails are as long as the stairways. Check any carpeting to make sure that it is firmly attached to the stairs. Fix any carpet that is loose or worn. Avoid having throw rugs at the top or bottom of the stairs. If you do have throw rugs, attach them to the floor with carpet tape. Make sure that you have a light switch at the top of the stairs and the bottom of the stairs. If you do not have them, ask someone to add them for you. What else can I do to help prevent falls? Wear shoes that: Do not have high heels. Have rubber bottoms. Are comfortable and fit you well. Are closed at the toe. Do not wear sandals. If you use a stepladder: Make sure that it is fully opened. Do not climb a closed stepladder. Make sure that  both sides of the stepladder are locked into place. Ask someone to hold it for you, if possible. Clearly mark and make sure that you can see: Any grab bars or handrails. First and last steps. Where the edge of each step is. Use tools that help you move around (mobility aids) if they are needed. These include: Canes. Walkers. Scooters. Crutches. Turn on the lights when you go into a dark area. Replace any light bulbs as soon as they burn out. Set up your furniture so you have a clear path. Avoid moving your furniture around. If any of your floors are uneven, fix them. If there are any pets around you, be aware of where they are. Review your medicines with your doctor. Some medicines can make you feel dizzy. This can increase your chance of falling. Ask your doctor what other things that you can do to help prevent falls. This information is not intended to replace advice given to you by your health care provider. Make sure you discuss any questions you have with your health care provider. Document Released: 10/25/2008 Document Revised: 06/06/2015 Document Reviewed: 02/02/2014 Elsevier Interactive Patient Education  2017 [employer].

## 2022-02-26 NOTE — Progress Notes (Signed)
Subjective:   David [name] is a 77 y.o. male who presents for Medicare Annual/Subsequent preventive examination.  Review of Systems    Defer to PCP Cardiac Risk Factors include: advanced age (>57mn, >>72women);male gender;dyslipidemia;hypertension     Objective:    Today's Vitals   02/26/22 1302 02/26/22 1330  BP: (!) 146/72 134/70  Pulse: 62 (!) 54  Weight: 247 lb 12.8 oz (112.4 kg)   Height: 5' 11"$  (1.803 m)    Body mass index is 34.56 kg/m.     02/26/2022    1:03 PM 12/28/2021   10:34 AM 12/13/2021   12:05 AM 10/15/2021   10:58 AM 06/18/2021    4:24 PM 03/11/2021   10:45 PM 02/20/2021   10:26 AM  Advanced Directives  Does Patient Have a Medical Advance Directive? No No No No No No No  Would patient like information on creating a medical advance directive? No - Patient declined   No - Patient declined Yes (MAU/Ambulatory/Procedural Areas - Information given)  No - Patient declined    Current Medications (verified) Outpatient Encounter Medications as of 02/26/2022  Medication Sig   apixaban (ELIQUIS) 5 MG TABS tablet TAKE 1 TABLET BY MOUTH TWICE A DAY   ascorbic acid (VITAMIN C) 1000 MG tablet Take by mouth.   atenolol (TENORMIN) 50 MG tablet Take 1 tablet (50 mg total) by mouth 2 (two) times daily.   atorvastatin (LIPITOR) 10 MG tablet TAKE ONE TABLET BY MOUTH EVERY MORNING   Cholecalciferol (VITAMIN D-1000 MAX ST) 25 MCG (1000 UT) tablet Take by mouth.   clotrimazole-betamethasone (LOTRISONE) lotion Apply topically 2 (two) times daily. For 2 weeks. (Patient taking differently: Apply topically daily. For 2 weeks.)   colchicine 0.6 MG tablet For gout flares, take 1 tab po once then repeat in 1 hour as needed if pain persists up to a total of 3 doses   fluconazole (DIFLUCAN) 150 MG tablet Take 1 tablet (150 mg total) by mouth once a week.   FLUoxetine (PROZAC) 10 MG tablet Take 1 tablet (10 mg total) by mouth in the morning and at bedtime.   fluticasone (FLONASE) 50  MCG/ACT nasal spray Place 2 sprays into both nostrils daily.   LORazepam (ATIVAN) 1 MG tablet Take 1 tablet (1 mg total) by mouth every 8 (eight) hours as needed for anxiety.   meclizine (ANTIVERT) 25 MG tablet Take 1 tablet (25 mg total) by mouth 3 (three) times daily as needed for up to 30 doses for dizziness.   omeprazole (PRILOSEC) 20 MG capsule Take 20 mg by mouth daily as needed (acid reflux).    ondansetron (ZOFRAN) 4 MG tablet Take 1 tablet (4 mg total) by mouth every 6 (six) hours.   triamcinolone (KENALOG) 0.025 % cream SMARTSIG:1 Topical Daily   No facility-administered encounter medications on file as of 02/26/2022.    Allergies (verified) Cefaclor, Cephalosporins, and Penicillins   History: Past Medical History:  Diagnosis Date   Adenoma of right adrenal gland 07/11/2002   2.4cm , noted on CT ABD   Anxiety    Arthritis of both knees 03/26/2016   Astigmatism    Back pain    BCC (basal cell carcinoma of skin)    under right eye and right ear   Blood transfusion without reported diagnosis    Cataract 09/29/2016   Depression    Diverticulitis 2009   Diverticulosis    Family history of breast cancer    Family history of colon cancer  Fatty liver    GERD (gastroesophageal reflux disease)    Gout    Hearing loss of both ears 03/26/2016   History of atrial fibrillation    x 2 none since 2020   History of chronic prostatitis    started at age 8   History of kidney stones    Hx of adenomatous colonic polyps 02/16/2022   3 diminutive - no recall   Hyperglycemia    Hyperlipidemia    Hypertension    Incomplete right bundle branch block (RBBB)    Internal hemorrhoids    Kidney lesion 06/07/2015   Right midportion, 1.1x1.1 cm hyperechoic, noted on [place] ABD   LAFB (left anterior fascicular block)    Lipoma of axilla 09/2016   Liver lesion, right lobe 06/07/2015   2.5x2.4x2.4 cm hypoechoic lesion posterior aspect, noted on [place] ABD   Low back pain 03/26/2016   LVH  (left ventricular hypertrophy) 08/06/2017   Moderate, Noted on ECHO   Medicare annual wellness visit, subsequent 03/12/2014   OA (osteoarthritis)    Back, Hands, Neck   Obesity 11/25/2007   Qualifier: Diagnosis of  By: Lenna Gilford MD, Deborra Medina    Other malaise and fatigue 03/13/2013   Premature ventricular complex    Prostate cancer ([place]) dx 2021   Renal insufficiency    Kidney removed right   Scoliosis    Upper thoracic and lumbar   Sinus bradycardia 08/06/2017   Noted on EKG   Vitamin D deficiency    resolved   Past Surgical History:  Procedure Laterality Date   CARDIOVERSION  07/31/2017   CATARACT EXTRACTION, BILATERAL     COLON SURGERY  2009   segmental sigmoid resection   COLONOSCOPY     COLONOSCOPY WITH PROPOFOL     CYSTOSCOPY N/A 07/28/2019   Procedure: CYSTOSCOPY FLEXIBLE;  Surgeon: Cleon Gustin, MD;  Location[facility];  Service: Urology;  Laterality: N/A;   CYSTOSCOPY W/ RETROGRADES Right 06/07/2018   Procedure: CYSTOSCOPY WITH RETROGRADE PYELOGRAM, uphrostogram;  Surgeon: Cleon Gustin, MD;  Location: [facility];  Service: Urology;  Laterality: Right;   CYSTOSCOPY WITH URETEROSCOPY AND STENT PLACEMENT Right 02/28/2018   Procedure: CYSTOSCOPY WITH URETEROSCOPY Con[place] Se;  Surgeon: Kathie Rhodes, MD;  Location: [facility];  Service: Urology;  Laterality: Right;   CYSTOSCOPY/URETEROSCOPY/HOLMIUM LASER/STENT PLACEMENT Right 11/29/2017   Procedure: CYSTOSCOPY/RETROGRADE/URETEROSCOPY/HOLMIUM LASER/STENT PLACEMENT;  Surgeon: Kathie Rhodes, MD;  Location: [facility];  Service: Urology;  Laterality: Right;   EYE SURGERY Bilateral 01/12/2017   cataract removal   history of blood tranfusion  age 29   HOLMIUM LASER APPLICATION Right [id number]   Procedure: HOLMIUM LASER APPLICATION;  Surgeon: Kathie Rhodes, MD;  Location: [facility];  Service: Urology;  Laterality: Right;   IR BALLOON DILATION URETERAL STRICTURE RIGHT   03/14/2018   IR NEPHRO TUBE REMOV/FL  03/17/2018   IR NEPHROSTOGRAM RIGHT THRU EXISTING ACCESS  03/17/2018   IR NEPHROSTOMY EXCHANGE RIGHT  03/14/2018   IR NEPHROSTOMY EXCHANGE RIGHT  06/21/2018   IR NEPHROSTOMY PLACEMENT RIGHT  03/02/2018   IR NEPHROSTOMY PLACEMENT RIGHT  04/20/2018   IR URETERAL STENT PLACEMENT EXISTING ACCESS RIGHT  03/14/2018   NOSE SURGERY     Submucous resection age 3   NUCLEAR STRESS TEST  06/03/2009   RADIOACTIVE SEED IMPLANT N/A 07/28/2019   Procedure: RADIOACTIVE SEED IMPLANT/BRACHYTHERAPY IMPLANT;  Surgeon: Cleon Gustin, MD;  Location: [facility];  Service: Urology;  Laterality: N/A;  90 MINS  ROBOT ASSISTED LAPAROSCOPIC NEPHRECTOMY Right 08/04/2018   Procedure: XI ROBOTIC ASSISTED LAPAROSCOPIC NEPHRECTOMY;  Surgeon: Cleon Gustin, MD;  Location: [facility];  Service: Urology;  Laterality: Right;  3 HRS   ROBOT ASSISTED PYELOPLASTY Right 06/07/2018   Procedure: attempted XI ROBOTIC ASSISTED PYELOPLASTY, lysis of adhesions;  Surgeon: Cleon Gustin, MD;  Location: [facility];  Service: Urology;  Laterality: Right;  3 HRS   SKIN BIOPSY     SPACE OAR INSTILLATION N/A 07/28/2019   Procedure: SPACE OAR INSTILLATION;  Surgeon: Cleon Gustin, MD;  Location[facility];  Service: Urology;  Laterality: N/A;   URETEROSCOPY WITH HOLMIUM LASER LITHOTRIPSY Right 09/24/2017   Procedure: CYSTOSCOPY, URETEROSCOPY WITH HOLMIUM LASER LITHOTRIPSY, STENT PLACEMENT;  Surgeon: Kathie Rhodes, MD;  Location: [facility];  Service: Urology;  Laterality: Right;   Family History  Problem Relation Age of Onset   Heart disease Mother    Hypertension Mother    Stroke Mother    Colon cancer Mother 8   Breast cancer Mother 25   Heart disease Father        pacemaker   Aortic aneurysm Father    Hypertension Father    Lung cancer Father 15   Heart disease Sister    Atrial fibrillation Sister    Obesity Sister    Sleep  apnea Sister    Heart attack Brother    Other Brother        muscle disease   Arthritis Brother    Stroke Brother    Atrial fibrillation Brother    Heart attack Brother    Atrial fibrillation Brother    Diabetes Brother    Atrial fibrillation Brother    Heart attack Brother    Hypertension Brother    Hyperlipidemia Brother    Heart attack Brother    Other Brother        heart valve operation   Atrial fibrillation Brother    Heart attack Maternal Grandmother    Diabetes Maternal Grandmother    Cancer Maternal Grandfather 82       hodgin's lymphoma   Heart attack Paternal Grandmother    Anxiety disorder Paternal Grandmother    Pneumonia Paternal Grandfather    Liver cancer Nephew 2       great nephew; d. 59   Lymphoma Niece 86   Esophageal cancer Neg Hx    Rectal cancer Neg Hx    Stomach cancer Neg Hx    Colon polyps Neg Hx    Pancreatic cancer Neg Hx    Social History   Socioeconomic History   Marital status: Married    Spouse name: Not on file   Number of children: 3   Years of education: Not on file   Highest education level: Not on file  Occupational History   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[employer]    Employer: [employer]  Tobacco Use   Smoking status: Former    Packs/day: 1.00    Years: 6.00    Total pack years: 6.00    Types: Cigarettes    Start date: 01/11/1969    Quit date: 01/12/1969    Years since quitting: 53.1   Smokeless tobacco: Never   Tobacco comments:    1965-1971  Vaping Use   Vaping Use: Never used  Substance and Sexual Activity   Alcohol use: Not Currently    Comment: 2 whiskey drinks per day   Drug use: Never   Sexual activity: Yes  Other Topics Concern  Not on file  Social History Narrative   The patient is married for the second time, he has 3 sons.   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   2 alcoholic beverages most days.   1 caffeinated beverage daily   No drug use no current tobacco use he is a prior smoker   12/24/2016   Social Determinants of  Health   Financial Resource Strain: Low Risk  (02/20/2021)   Overall Financial Resource Strain (CARDIA)    Difficulty of Paying Living Expenses: Not hard at all  Food Insecurity: No Food Insecurity (02/26/2022)   Hunger Vital Sign    Worried About Running Out of Food in the Last Year: Never true    Ran Out of Food in the Last Year: Never true  Transportation Needs: No Transportation Needs (02/26/2022)   PRAPARE - Hydrologist (Medical): No    Lack of Transportation (Non-Medical): No  Physical Activity: Insufficiently Active (02/20/2021)   Exercise Vital Sign    Days of Exercise per Week: 5 days    Minutes of Exercise per Session: 20 min  Stress: Stress Concern Present (02/20/2021)   [place]    Feeling of Stress : To some extent  Social Connections: Moderately Isolated (02/20/2021)   Social Connection and Isolation Panel [NHANES]    Frequency of Communication with Friends and Family: More than three times a week    Frequency of Social Gatherings with Friends and Family: Twice a week    Attends Religious Services: Never    Marine scientist or Organizations: No    Attends Archivist Meetings: Never    Marital Status: Married    Tobacco Counseling Counseling given: Not Answered Tobacco comments: [phone]   Clinical Intake:  Pre-visit preparation completed: Yes  Pain : No/denies pain  Diabetes: No  How often do you need to have someone help you when you read instructions, pamphlets, or other written materials from your doctor or pharmacy?: 1 - Never   Activities of Daily Living    02/26/2022    1:14 PM  In your present state of health, do you have any difficulty performing the following activities:  Hearing? 1  Vision? 1  Difficulty concentrating or making decisions? 1  Walking or climbing stairs? 1  Dressing or bathing? 0  Doing errands, shopping? 0  Preparing  Food and eating ? N  Using the Toilet? N  In the past six months, have you accidently leaked urine? Y  Do you have problems with loss of bowel control? N  Managing your Medications? N  Managing your Finances? N  Housekeeping or managing your Housekeeping? N    Patient Care Team: Mosie Lukes, MD as PCP - General (Family Medicine) Minus Breeding, MD as PCP - Cardiology (Cardiology) Linward Natal, MD as Consulting Physician (Ophthalmology) Danella Sensing, MD as Consulting Physician (Dermatology) Cira Rue, RN Nurse Navigator as Registered Nurse ([place]) [facility], [facility]  Indicate any recent Medical Services you may have received from other than [facility] providers in the past year (date may be approximate).     Assessment:   This is a routine wellness examination for [place].  Hearing/Vision screen No results found.  Dietary issues and exercise activities discussed: Current Exercise Habits: The patient does not participate in regular exercise at present, Exercise limited by: orthopedic condition(s)   Goals Addressed   None    Depression  Screen    02/26/2022    1:09 PM 10/28/2021    2:15 PM 07/22/2021    4:16 PM 02/20/2021   10:33 AM 12/19/2020    2:51 PM 09/02/2020    4:25 PM 02/15/2020    9:11 AM  PHQ 2/9 Scores  PHQ - 2 Score 6 1 1 1 2 1 3  $ PHQ- 9 Score 15 1 7  11 10 5    $ Fall Risk    02/26/2022    1:03 PM 10/28/2021    2:14 PM 07/22/2021    4:16 PM 02/20/2021   10:31 AM 02/15/2020    9:07 AM  Fall Risk   Falls in the past year? 0 0 0 0 1  Number falls in past yr: 0 0 0 0 0  Injury with Fall? 0 0 0 0 0  Risk for fall due to : No Fall Risks  No Fall Risks    Follow up Falls evaluation completed Falls evaluation completed Falls evaluation completed Falls prevention discussed Falls prevention discussed    FALL RISK PREVENTION PERTAINING TO THE HOME:  Any stairs in or around the home? Yes  If so, are there any without handrails? No  Home  free of loose throw rugs in walkways, pet beds, electrical cords, etc? Yes  Adequate lighting in your home to reduce risk of falls? Yes   ASSISTIVE DEVICES UTILIZED TO PREVENT FALLS:  Life alert? No  Use of a cane, walker or w/c? No  Grab bars in the bathroom? No  Shower chair or bench in shower? Yes  Elevated toilet seat or a handicapped toilet? No   TIMED UP AND GO:  Was the test performed? Yes .  Length of time to ambulate 10 feet: 8 sec.   Gait slow and steady without use of assistive device  Cognitive Function:        02/26/2022    1:24 PM 02/15/2020    9:19 AM  6CIT Screen  What Year? 0 points 0 points  What month? 0 points 0 points  What time? 0 points 0 points  Count back from 20 0 points 0 points  Months in reverse 0 points 0 points  Repeat phrase 2 points 0 points  Total Score 2 points 0 points    Immunizations Immunization History  Administered Date(s) Administered   Fluad Quad(high Dose 65+) 10/12/2019   Influenza Split 10/02/2015   Influenza, High Dose Seasonal PF 09/29/2016, 11/15/2017, 09/07/2018   Influenza,inj,Quad PF,6+ Mos 09/14/2012, 09/25/2013, 03/07/2015   Influenza-Unspecified 11/12/2020   PFIZER(Purple Top)SARS-COV-2 Vaccination 03/10/2019, 03/28/2019, 01/02/2020, 05/01/2020   Pneumococcal Conjugate-13 09/05/2012   Pneumococcal Polysaccharide-23 08/30/2014   Tdap 09/05/2012    TDAP status: Up to date  Flu Vaccine status: Declined, Education has been provided regarding the importance of this vaccine but patient still declined. Advised may receive this vaccine at local pharmacy or Health Dept. Aware to provide a copy of the vaccination record if obtained from local pharmacy or Health Dept. Verbalized acceptance and understanding.  Pneumococcal vaccine status: Up to date  Covid-19 vaccine status: Information provided on how to obtain vaccines.   Qualifies for Shingles Vaccine? Yes   Zostavax completed No   Shingrix Completed?: No.     Education has been provided regarding the importance of this vaccine. Patient has been advised to call insurance company to determine out of pocket expense if they have not yet received this vaccine. Advised may also receive vaccine at local pharmacy or Health Dept. Verbalized acceptance  and understanding.  Screening Tests Health Maintenance  Topic Date Due   Zoster Vaccines- Shingrix (1 of 2) Never done   COVID-19 Vaccine (5 - 2023-24 season) 09/12/2021   Medicare Annual Wellness (AWV)  02/20/2022   INFLUENZA VACCINE  04/12/2022 (Originally 08/12/2021)   DTaP/Tdap/Td (2 - Td or Tdap) 09/06/2022   Pneumonia Vaccine 69+ Years old  Completed   Hepatitis C Screening  Completed   HPV VACCINES  Aged Out   COLONOSCOPY (Pts 45-41yr Insurance coverage will need to be confirmed)  Discontinued    Health Maintenance  Health Maintenance Due  Topic Date Due   Zoster Vaccines- Shingrix (1 of 2) Never done   COVID-19 Vaccine (5 - 2023-24 season) 09/12/2021   Medicare Annual Wellness (AWV)  02/20/2022    Colorectal cancer screening: Type of screening: Colonoscopy. Completed 02/16/22. Repeat every N/a years  Lung Cancer Screening: (Low Dose CT Chest recommended if Age 77-80years, 30 pack-year currently smoking OR have quit w/in 15years.) does not qualify.   Additional Screening:  Hepatitis C Screening: does qualify; Completed 05/04/14  Vision Screening: Recommended annual ophthalmology exams for early detection of glaucoma and other disorders of the eye. Is the patient up to date with their annual eye exam?  Yes  Who is the provider or what is the name of the office in which the patient attends annual eye exams? Dr. TJac CanavanIf pt is not established with a provider, would they like to be referred to a provider to establish care? No .   Dental Screening: Recommended annual dental exams for proper oral hygiene  Community Resource Referral / Chronic Care Management: CRR required this visit?  No    CCM required this visit?  No      Plan:     I have personally reviewed and noted the following in the patient's chart:   Medical and social history Use of alcohol, tobacco or illicit drugs  Current medications and supplements including opioid prescriptions. Patient is not currently taking opioid prescriptions. Functional ability and status Nutritional status Physical activity Advanced directives List of other physicians Hospitalizations, surgeries, and ER visits in previous 12 months Vitals Screenings to include cognitive, depression, and falls Referrals and appointments  In addition, I have reviewed and discussed with patient certain preventive protocols, quality metrics, and best practice recommendations. A written personalized care plan for preventive services as well as general preventive health recommendations were provided to patient.     BBeatris Ship COregon  02/26/2022   Nurse Notes: None

## 2022-03-10 ENCOUNTER — Ambulatory Visit (INDEPENDENT_AMBULATORY_CARE_PROVIDER_SITE_OTHER): Payer: Medicare Other | Admitting: Family Medicine

## 2022-03-10 VITALS — BP 132/78 | HR 58 | Temp 97.5°F | Resp 16 | Ht 71.0 in | Wt 251.0 lb

## 2022-03-10 DIAGNOSIS — N1831 Chronic kidney disease, stage 3a: Secondary | ICD-10-CM | POA: Diagnosis not present

## 2022-03-10 DIAGNOSIS — H9201 Otalgia, right ear: Secondary | ICD-10-CM | POA: Diagnosis not present

## 2022-03-10 DIAGNOSIS — R252 Cramp and spasm: Secondary | ICD-10-CM | POA: Diagnosis not present

## 2022-03-10 DIAGNOSIS — M1A9XX0 Chronic gout, unspecified, without tophus (tophi): Secondary | ICD-10-CM

## 2022-03-10 DIAGNOSIS — N189 Chronic kidney disease, unspecified: Secondary | ICD-10-CM | POA: Diagnosis not present

## 2022-03-10 DIAGNOSIS — E6609 Other obesity due to excess calories: Secondary | ICD-10-CM

## 2022-03-10 DIAGNOSIS — E782 Mixed hyperlipidemia: Secondary | ICD-10-CM

## 2022-03-10 DIAGNOSIS — I10 Essential (primary) hypertension: Secondary | ICD-10-CM

## 2022-03-10 DIAGNOSIS — R739 Hyperglycemia, unspecified: Secondary | ICD-10-CM | POA: Diagnosis not present

## 2022-03-10 DIAGNOSIS — R350 Frequency of micturition: Secondary | ICD-10-CM | POA: Diagnosis not present

## 2022-03-10 DIAGNOSIS — R7989 Other specified abnormal findings of blood chemistry: Secondary | ICD-10-CM

## 2022-03-10 DIAGNOSIS — R21 Rash and other nonspecific skin eruption: Secondary | ICD-10-CM | POA: Diagnosis not present

## 2022-03-10 DIAGNOSIS — G8929 Other chronic pain: Secondary | ICD-10-CM

## 2022-03-10 DIAGNOSIS — M25562 Pain in left knee: Secondary | ICD-10-CM

## 2022-03-10 LAB — COMPREHENSIVE METABOLIC PANEL
ALT: 25 U/L (ref 0–53)
AST: 21 U/L (ref 0–37)
Albumin: 3.8 g/dL (ref 3.5–5.2)
Alkaline Phosphatase: 82 U/L (ref 39–117)
BUN: 19 mg/dL (ref 6–23)
CO2: 28 mEq/L (ref 19–32)
Calcium: 10.2 mg/dL (ref 8.4–10.5)
Chloride: 104 mEq/L (ref 96–112)
Creatinine, Ser: 1.86 mg/dL — ABNORMAL HIGH (ref 0.40–1.50)
GFR: 34.64 mL/min — ABNORMAL LOW (ref >60.00)
Glucose, Bld: 90 mg/dL (ref 70–99)
Potassium: 4.4 mEq/L (ref 3.5–5.1)
Sodium: 140 mEq/L (ref 135–145)
Total Bilirubin: 0.7 mg/dL (ref 0.2–1.2)
Total Protein: 6.3 g/dL (ref 6.0–8.3)

## 2022-03-10 LAB — CBC WITH DIFFERENTIAL/PLATELET
Basophils Absolute: 0 10*3/uL (ref 0.0–0.1)
Basophils Relative: 0.5 % (ref 0.0–3.0)
Eosinophils Absolute: 0.3 10*3/uL (ref 0.0–0.7)
Eosinophils Relative: 5.1 % — ABNORMAL HIGH (ref 0.0–5.0)
HCT: 42.2 % (ref 39.0–52.0)
Hemoglobin: 14 g/dL (ref 13.0–17.0)
Lymphocytes Relative: 29.3 % (ref 12.0–46.0)
Lymphs Abs: 1.8 10*3/uL (ref 0.7–4.0)
MCHC: 33.2 g/dL (ref 30.0–36.0)
MCV: 94.2 fl (ref 78.0–100.0)
Monocytes Absolute: 0.5 10*3/uL (ref 0.1–1.0)
Monocytes Relative: 8.8 % (ref 3.0–12.0)
Neutro Abs: 3.4 10*3/uL (ref 1.4–7.7)
Neutrophils Relative %: 56.3 % (ref 43.0–77.0)
Platelets: 215 10*3/uL (ref 150.0–400.0)
RBC: 4.47 Mil/uL (ref 4.22–5.81)
RDW: 15.1 % (ref 11.5–15.5)
WBC: 6 10*3/uL (ref 4.0–10.5)

## 2022-03-10 LAB — URIC ACID: Uric Acid, Serum: 9.7 mg/dL — ABNORMAL HIGH (ref 4.0–7.8)

## 2022-03-10 LAB — LIPID PANEL
Cholesterol: 172 mg/dL (ref 0–200)
HDL: 37.4 mg/dL — ABNORMAL LOW (ref >39.00)
LDL Cholesterol: 106 mg/dL — ABNORMAL HIGH (ref 0–99)
NonHDL: 134.88
Total CHOL/HDL Ratio: 5
Triglycerides: 142 mg/dL (ref 0.0–149.0)
VLDL: 28.4 mg/dL (ref 0.0–40.0)

## 2022-03-10 LAB — TSH: TSH: 4.84 u[IU]/mL (ref 0.35–5.50)

## 2022-03-10 LAB — HEMOGLOBIN A1C: Hgb A1c MFr Bld: 5.8 % (ref 4.6–6.5)

## 2022-03-10 LAB — T4, FREE: Free T4: 0.79 ng/dL (ref 0.60–1.60)

## 2022-03-10 LAB — T3, FREE: T3, Free: 2.8 pg/mL (ref 2.3–4.2)

## 2022-03-10 MED ORDER — KETOCONAZOLE 2 % EX CREA: 1.0000 | TOPICAL_CREAM | Freq: Every day | CUTANEOUS | 2 refills | Status: DC

## 2022-03-10 MED ORDER — BUPROPION HCL ER (XL) 150 MG PO TB24: 150.0000 mg | ORAL_TABLET | Freq: Every day | ORAL | 3 refills | Status: DC

## 2022-03-10 MED ORDER — FLUCONAZOLE 150 MG PO TABS: ORAL_TABLET | ORAL | 2 refills | Status: DC

## 2022-03-10 NOTE — Patient Instructions (Signed)
Intertrigo Intertrigo is skin irritation or inflammation (dermatitis) that occurs when folds of skin rub together. The irritation can cause a rash and make skin raw and itchy. This condition mostly occurs in the skin folds of these areas: Armpits. Under the breasts. Under the abdomen. Groin. Buttocks. Toes. Intertrigo is not passed from person to person (is not contagious). What are the causes? This condition is caused by heat, moisture, rubbing (friction), and not enough air circulation. The condition can be made worse by: Sweat. Bacteria. A fungus, such as yeast. What increases the risk? This condition is more likely to occur if you have moisture in your skin folds. You are more likely to develop this condition if you: Are not able to move around or are not active. Live in a warm and moist climate. Are not able to control your bowels or bladder (have incontinence). Wear splints, braces, or other medical devices. Are overweight. Have diabetes. What are the signs or symptoms? Symptoms of this condition include: A pink or red skin rash in a skin fold or near a skin fold. Raw or scaly skin. Itchiness or burning. Bleeding. Leaking fluid. A bad smell. How is this diagnosed? This condition is diagnosed with a medical history and physical exam. You may also have a skin swab to test for bacteria or fungus. How is this treated? This condition may be treated by: Cleaning and drying your skin. Taking an antibiotic medicine or using an antibiotic skin cream for a bacterial infection. Using an antifungal cream on your skin or taking pills for an infection that was caused by a fungus, such as yeast. Using a steroid ointment to relieve itchiness and irritation. Separating the skin fold with a clean cotton cloth to absorb moisture and allow air to flow into the area. Follow these instructions at home: Keep the affected area clean and dry. Do not scratch your skin. Stay in a cool  environment as much as possible. Use an air conditioner or fan, if available. Apply over-the-counter and prescription medicines only as told by your health care provider. If you were prescribed antibiotics, use them as told by your health care provider. Do not stop using the antibiotic even if you start to feel better. Keep all follow-up visits. Your health care provider may need to check how well your skin is responding to the treatment. How is this prevented? Shower and dry yourself well after activity or exercise. Use a hair dryer on a cool setting to dry between skin folds, especially after you bathe. Do not wear tight clothes. Wear clothes that are loose, absorbent, and made of cotton. Wear a bra that gives good support, if needed. Protect the skin around your groin and buttocks, especially if you have incontinence. Skin protection includes: Following a regular cleaning routine. Using skin protectant creams, powders, or ointments. Changing protection pads frequently. Maintain a healthy weight. Take care of your feet, especially if you have diabetes. Foot care includes: Wearing shoes that fit well. Keeping your feet dry. Wearing clean, breathable socks. If you have diabetes, keep your blood sugar under control. Contact a health care provider if: Your symptoms do not improve with treatment. Your symptoms get worse or they spread. You notice increased redness and warmth. You have a fever. This information is not intended to replace advice given to you by your health care provider. Make sure you discuss any questions you have with your health care provider. Document Revised: 05/22/2021 Document Reviewed: 05/22/2021 Elsevier Patient Education  2023 [employer]vier  Inc.  

## 2022-03-10 NOTE — Progress Notes (Signed)
Subjective:   By signing my name below, I, David [name], attest that this documentation has been prepared under the direction and in the presence of David Lukes, MD., 03/10/2022.   Patient ID: David [name], male    DOB: 01/10/1946, 77 y.o.   MRN: [id number]  No chief complaint on file.  HPI Patient is in today for an office visit. He denies CP/palpitations/SOB/HA/congestion/ fever/chills/GI or GU symptoms.  Anxiety/Depression He continues taking Fluoxetine 10 mg to manage his anxiety/depression and is interesting in restarting Bupropion 150 mg.  Left Knee Pain Patient reports that he recently pulled a muscle in his left leg and now has left knee pain. He is requesting a referral to [facility]. He denies associated urinary symptoms and states he will see urology next month. His last PSA was 4.3 ng/mL.  Rash Patient complains of recurrent rashes across his body, predominantly across his back, chest, and inner thighs. He denies pain but states the rashes have been itchy. He reports that he currently has a rash on his penis which is causing irritation and there is scar tissue, tightening the foreskin. He has used Fluconazole 150 mg and Ketoconazole 2% cream in the past, prescribed by his dermatologist Dr. Ronnald Ramp, which resolved the rashes. He is requesting a refill on these medications as he states that the rashes reappear when he stops taking Fluconazole. He has also found CeraVe cream helpful.  Right Ear Pain He reports that his right ear has been intermittently aching for the past 2 years but recently flared and is now causing irritation.   Right Hip Pain He continues experiencing right hip and back pain but has been attempting to alleviate this by walking daily.  Sore Throat He reports that he has been experiencing sore throat intermittently for the past 2 years and is seeking a treatment for this.  Past Medical History:  Diagnosis Date   Adenoma of right adrenal gland  07/11/2002   2.4cm , noted on CT ABD   Anxiety    Arthritis of both knees 03/26/2016   Astigmatism    Back pain    BCC (basal cell carcinoma of skin)    under right eye and right ear   Blood transfusion without reported diagnosis    Cataract 09/29/2016   Depression    Diverticulitis 2009   Diverticulosis    Family history of breast cancer    Family history of colon cancer    Fatty liver    GERD (gastroesophageal reflux disease)    Gout    Hearing loss of both ears 03/26/2016   History of atrial fibrillation    x 2 none since 2020   History of chronic prostatitis    started at age 88   History of kidney stones    Hx of adenomatous colonic polyps 02/16/2022   3 diminutive - no recall   Hyperglycemia    Hyperlipidemia    Hypertension    Incomplete right bundle branch block (RBBB)    Internal hemorrhoids    Kidney lesion 06/07/2015   Right midportion, 1.1x1.1 cm hyperechoic, noted on [place] ABD   LAFB (left anterior fascicular block)    Lipoma of axilla 09/2016   Liver lesion, right lobe 06/07/2015   2.5x2.4x2.4 cm hypoechoic lesion posterior aspect, noted on [place] ABD   Low back pain 03/26/2016   LVH (left ventricular hypertrophy) 08/06/2017   Moderate, Noted on ECHO   Medicare annual wellness visit, subsequent 03/12/2014   OA (osteoarthritis)  Back, Hands, Neck   Obesity 11/25/2007   Qualifier: Diagnosis of  By: Lenna Gilford MD, Deborra Medina    Other malaise and fatigue 03/13/2013   Premature ventricular complex    Prostate cancer ([place]) dx 2021   Renal insufficiency    Kidney removed right   Scoliosis    Upper thoracic and lumbar   Sinus bradycardia 08/06/2017   Noted on EKG   Vitamin D deficiency    resolved    Past Surgical History:  Procedure Laterality Date   CARDIOVERSION  07/31/2017   CATARACT EXTRACTION, BILATERAL     COLON SURGERY  2009   segmental sigmoid resection   COLONOSCOPY     COLONOSCOPY WITH PROPOFOL     CYSTOSCOPY N/A 07/28/2019   Procedure:  CYSTOSCOPY FLEXIBLE;  Surgeon: Cleon Gustin, MD;  Location: [facility];  Service: Urology;  Laterality: N/A;   CYSTOSCOPY W/ RETROGRADES Right 06/07/2018   Procedure: CYSTOSCOPY WITH RETROGRADE PYELOGRAM, uphrostogram;  Surgeon: Cleon Gustin, MD;  Location: [facility];  Service: Urology;  Laterality: Right;   CYSTOSCOPY WITH URETEROSCOPY AND STENT PLACEMENT Right 02/28/2018   Procedure: CYSTOSCOPY WITH URETEROSCOPY Concha Se;  Surgeon: Kathie Rhodes, MD;  Location: [facility];  Service: Urology;  Laterality: Right;   CYSTOSCOPY/URETEROSCOPY/HOLMIUM LASER/STENT PLACEMENT Right 11/29/2017   Procedure: CYSTOSCOPY/RETROGRADE/URETEROSCOPY/HOLMIUM LASER/STENT PLACEMENT;  Surgeon: Kathie Rhodes, MD;  Location: [facility];  Service: Urology;  Laterality: Right;   EYE SURGERY Bilateral 01/12/2017   cataract removal   history of blood tranfusion  age 17   HOLMIUM LASER APPLICATION Right [id number]   Procedure: HOLMIUM LASER APPLICATION;  Surgeon: Kathie Rhodes, MD;  Location: [facility];  Service: Urology;  Laterality: Right;   IR BALLOON DILATION URETERAL STRICTURE RIGHT  03/14/2018   IR NEPHRO TUBE REMOV/FL  03/17/2018   IR NEPHROSTOGRAM RIGHT THRU EXISTING ACCESS  03/17/2018   IR NEPHROSTOMY EXCHANGE RIGHT  03/14/2018   IR NEPHROSTOMY EXCHANGE RIGHT  06/21/2018   IR NEPHROSTOMY PLACEMENT RIGHT  03/02/2018   IR NEPHROSTOMY PLACEMENT RIGHT  04/20/2018   IR URETERAL STENT PLACEMENT EXISTING ACCESS RIGHT  03/14/2018   NOSE SURGERY     Submucous resection age 61   NUCLEAR STRESS TEST  06/03/2009   RADIOACTIVE SEED IMPLANT N/A 07/28/2019   Procedure: RADIOACTIVE SEED IMPLANT/BRACHYTHERAPY IMPLANT;  Surgeon: Cleon Gustin, MD;  Location: [facility]  Service: Urology;  Laterality: N/A;  90 MINS   ROBOT ASSISTED LAPAROSCOPIC NEPHRECTOMY Right 08/04/2018   Procedure: XI ROBOTIC ASSISTED LAPAROSCOPIC NEPHRECTOMY;  Surgeon:  Cleon Gustin, MD;  Location: [facility];  Service: Urology;  Laterality: Right;  3 HRS   ROBOT ASSISTED PYELOPLASTY Right 06/07/2018   Procedure: attempted XI ROBOTIC ASSISTED PYELOPLASTY, lysis of adhesions;  Surgeon: Cleon Gustin, MD;  Location: [facility];  Service: Urology;  Laterality: Right;  3 HRS   SKIN BIOPSY     SPACE OAR INSTILLATION N/A 07/28/2019   Procedure: SPACE OAR INSTILLATION;  Surgeon: Cleon Gustin, MD;  Location[facility];  Service: Urology;  Laterality: N/A;   URETEROSCOPY WITH HOLMIUM LASER LITHOTRIPSY Right 09/24/2017   Procedure: CYSTOSCOPY, URETEROSCOPY WITH HOLMIUM LASER LITHOTRIPSY, STENT PLACEMENT;  Surgeon: Kathie Rhodes, MD;  Location: [facility];  Service: Urology;  Laterality: Right;    Family History  Problem Relation Age of Onset   Heart disease Mother    Hypertension Mother    Stroke Mother    Colon cancer Mother 25  Breast cancer Mother 73   Heart disease Father        pacemaker   Aortic aneurysm Father    Hypertension Father    Lung cancer Father 72   Heart disease Sister    Atrial fibrillation Sister    Obesity Sister    Sleep apnea Sister    Heart attack Brother    Other Brother        muscle disease   Arthritis Brother    Stroke Brother    Atrial fibrillation Brother    Heart attack Brother    Atrial fibrillation Brother    Diabetes Brother    Atrial fibrillation Brother    Heart attack Brother    Hypertension Brother    Hyperlipidemia Brother    Heart attack Brother    Other Brother        heart valve operation   Atrial fibrillation Brother    Heart attack Maternal Grandmother    Diabetes Maternal Grandmother    Cancer Maternal Grandfather 44       hodgin's lymphoma   Heart attack Paternal Grandmother    Anxiety disorder Paternal Grandmother    Pneumonia Paternal Grandfather    Liver cancer Nephew 2       great nephew; d. 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   2 alcoholic beverages most days.   1 caffeinated beverage daily   No drug use no current tobacco use he is a prior smoker   12/24/2016   Social Determinants of Health   Financial Resource Strain: Low Risk  (02/20/2021)   Overall Financial Resource Strain (CARDIA)    Difficulty of Paying Living Expenses: Not hard at all  Food Insecurity: No Food Insecurity (02/26/2022)   Hunger Vital Sign    Worried About Running Out of Food in the Last Year: Never true    Ran Out of Food in the Last Year: Never true  Transportation Needs: No Transportation Needs (02/26/2022)   PRAPARE - Hydrologist (Medical): No    Lack of Transportation (Non-Medical): No  Physical Activity: Insufficiently Active (02/20/2021)   Exercise Vital Sign  Days of Exercise per Week: 5 days    Minutes of Exercise per Session: 20 min  Stress: Stress Concern Present (02/20/2021)   Cole    Feeling of Stress : To some extent  Social Connections: Moderately Isolated (02/20/2021)   Social Connection and Isolation Panel [NHANES]    Frequency of Communication with Friends and Family: More than three times a week    Frequency of Social Gatherings with Friends and Family: Twice a week    Attends Religious Services: Never    Marine scientist or Organizations: No    Attends Archivist Meetings: Never    Marital Status: Married  Human resources officer Violence: Not At Risk (02/26/2022)   Humiliation, Afraid, Rape, and Kick questionnaire    Fear of Current or Ex-Partner: No    Emotionally Abused: No    Physically Abused: No    Sexually Abused: No    Outpatient Medications Prior to Visit  Medication Sig Dispense Refill   apixaban (ELIQUIS) 5 MG TABS tablet TAKE 1 TABLET BY MOUTH TWICE A DAY 60 tablet 5   ascorbic acid (VITAMIN C) 1000 MG tablet Take by mouth.     atenolol (TENORMIN) 50 MG tablet Take 1 tablet (50 mg total) by mouth 2 (two) times daily. 60 tablet 5   atorvastatin (LIPITOR) 10 MG tablet TAKE ONE TABLET BY MOUTH EVERY MORNING 90 tablet 1   Cholecalciferol (VITAMIN D-1000 MAX ST) 25 MCG (1000 UT) tablet Take by mouth.     clotrimazole-betamethasone (LOTRISONE) lotion Apply topically 2 (two) times daily. For 2 weeks. (Patient taking differently: Apply topically daily. For 2 weeks.) 30 mL 0   colchicine 0.6 MG tablet For gout flares, take 1 tab po once then repeat in 1 hour as needed if pain persists up to a total of 3 doses 15 tablet 0   fluconazole (DIFLUCAN) 150 MG tablet Take 1 tablet (150 mg total) by mouth once a week. 4 tablet 1   FLUoxetine (PROZAC) 10 MG tablet Take 1 tablet (10 mg total) by mouth in the morning and at bedtime. 180 tablet 0   fluticasone (FLONASE) 50 MCG/ACT nasal spray Place 2 sprays into both nostrils daily. 16 g 0   LORazepam (ATIVAN) 1 MG tablet Take 1 tablet (1 mg total) by mouth every 8 (eight) hours as needed for  anxiety. 90 tablet 5   meclizine (ANTIVERT) 25 MG tablet Take 1 tablet (25 mg total) by mouth 3 (three) times daily as needed for up to 30 doses for dizziness. 30 tablet 0   omeprazole (PRILOSEC) 20 MG capsule Take 20 mg by mouth daily as needed (acid reflux).      ondansetron (ZOFRAN) 4 MG tablet Take 1 tablet (4 mg total) by mouth every 6 (six) hours. 12 tablet 0   triamcinolone (KENALOG) 0.025 % cream SMARTSIG:1 Topical Daily     No facility-administered medications prior to visit.    Allergies  Allergen Reactions   Cefaclor Hives   Cephalosporins Hives   Penicillins Rash    Mild maculopapular rash Has patient had a PCN reaction causing immediate rash, facial/tongue/throat swelling, SOB or lightheadedness with hypotension: No Has patient had a PCN reaction causing severe rash involving mucus membranes or skin necrosis: No Has patient had a PCN reaction that required hospitalization: No Has patient had a PCN reaction occurring within the last 10 years: No If all of the above answers are "NO", then may  proceed with Cephalosporin use.     Review of Systems  Constitutional:  Negative for chills and fever.  HENT:  Positive for ear pain (right ear) and sore throat. Negative for congestion.   Respiratory:  Negative for shortness of breath.   Cardiovascular:  Negative for chest pain and palpitations.  Gastrointestinal:  Negative for abdominal pain, blood in stool, constipation, diarrhea, nausea and vomiting.  Genitourinary:  Negative for dysuria, frequency, hematuria and urgency.  Musculoskeletal:  Positive for back pain.       (+) Left knee pain. (+) Right hip pain.  Skin:  Positive for itching and rash.       (+) rash across back, chest, and inner thighs.  (+) rash on penis.  Neurological:  Negative for headaches.      Objective:    Physical Exam Constitutional:      General: He is not in acute distress.    Appearance: Normal appearance. He is normal weight. He is not  ill-appearing.  HENT:     Head: Normocephalic and atraumatic.     Right Ear: External ear normal.     Left Ear: External ear normal.     Nose: Nose normal.     Mouth/Throat:     Mouth: Mucous membranes are moist.     Pharynx: Oropharynx is clear.  Eyes:     General:        Right eye: No discharge.        Left eye: No discharge.     Extraocular Movements: Extraocular movements intact.     Conjunctiva/sclera: Conjunctivae normal.     Pupils: Pupils are equal, round, and reactive to light.  Cardiovascular:     Rate and Rhythm: Normal rate and regular rhythm.     Pulses: Normal pulses.     Heart sounds: Normal heart sounds. No murmur heard.    No gallop.  Pulmonary:     Effort: Pulmonary effort is normal. No respiratory distress.     Breath sounds: Normal breath sounds. No wheezing or rales.  Abdominal:     General: Bowel sounds are normal.     Palpations: Abdomen is soft.     Tenderness: There is no abdominal tenderness. There is no guarding.  Musculoskeletal:        General: Normal range of motion.     Cervical back: Normal range of motion.     Right lower leg: No edema.     Left lower leg: No edema.  Skin:    General: Skin is warm and dry.  Neurological:     Mental Status: He is alert and oriented to person, place, and time.  Psychiatric:        Mood and Affect: Mood normal.        Behavior: Behavior normal.        Judgment: Judgment normal.     There were no vitals taken for this visit. Wt Readings from Last 3 Encounters:  02/26/22 247 lb 12.8 oz (112.4 kg)  02/16/22 247 lb (112 kg)  02/11/22 247 lb 2 oz (112.1 kg)    Diabetic Foot Exam - Simple   No data filed    Lab Results  Component Value Date   WBC 7.5 12/13/2021   HGB 14.5 12/13/2021   HCT 43.5 12/13/2021   PLT 193 12/13/2021   GLUCOSE 90 12/18/2021   CHOL 154 10/21/2021   TRIG 182.0 (H) 10/21/2021   HDL 31.10 (L) 10/21/2021   LDLDIRECT 168.5 03/29/2006  LDLCALC 86 10/21/2021   ALT 25  10/28/2021   AST 20 10/28/2021   NA 138 12/18/2021   K 4.1 12/18/2021   CL 102 12/18/2021   CREATININE 1.86 (H) 12/18/2021   BUN 20 12/18/2021   CO2 23 12/18/2021   TSH 4.83 10/21/2021   PSA 4.01 (H) 07/16/2021   INR 1.0 07/25/2019   HGBA1C 5.9 10/21/2021    Lab Results  Component Value Date   TSH 4.83 10/21/2021   Lab Results  Component Value Date   WBC 7.5 12/13/2021   HGB 14.5 12/13/2021   HCT 43.5 12/13/2021   MCV 92.8 12/13/2021   PLT 193 12/13/2021   Lab Results  Component Value Date   NA 138 12/18/2021   K 4.1 12/18/2021   CO2 23 12/18/2021   GLUCOSE 90 12/18/2021   BUN 20 12/18/2021   CREATININE 1.86 (H) 12/18/2021   BILITOT 0.7 10/28/2021   ALKPHOS 83 10/28/2021   AST 20 10/28/2021   ALT 25 10/28/2021   PROT 7.0 10/28/2021   ALBUMIN 4.4 10/28/2021   CALCIUM 10.0 12/18/2021   ANIONGAP 6 12/13/2021   EGFR 37 (L) 12/18/2021   GFR 32.41 (L) 10/28/2021   Lab Results  Component Value Date   CHOL 154 10/21/2021   Lab Results  Component Value Date   HDL 31.10 (L) 10/21/2021   Lab Results  Component Value Date   LDLCALC 86 10/21/2021   Lab Results  Component Value Date   TRIG 182.0 (H) 10/21/2021   Lab Results  Component Value Date   CHOLHDL 5 10/21/2021   Lab Results  Component Value Date   HGBA1C 5.9 10/21/2021      Assessment & Plan:  Anxiety/Depression: Patient continues taking Fluoxetine 10 mg and is interested in taking Bupropion 150 mg again.  Healthy Lifestyle: Encouraged heart healthy diet, hydration, and reduced alcohol consumption.  Labs: Routine blood work ordered today.  Left Knee Pain: Referral placed to [facility] to manage left knee pain.  Rash: Prescribed Fluconazole 150 mg to take once monthly prn and Ketoconazole 2% cream prn to manage bodily rashes. Encouraged cleanliness and probiotics. Problem List Items Addressed This Visit   None  No orders of the defined types were placed in this encounter.  I, David [name], personally preformed the services described in this documentation.  All medical record entries made by the scribe were at my direction and in my presence.  I have reviewed the chart and discharge instructions (if applicable) and agree that the record reflects my personal performance and is accurate and complete. 03/10/2022  I,Mohammed Iqbal,acting as a scribe for Penni Homans, MD.,have documented all relevant documentation on the behalf of Penni Homans, MD,as directed by  Penni Homans, MD while in the presence of Penni Homans, MD.  David [name]

## 2022-03-11 ENCOUNTER — Other Ambulatory Visit: Payer: Self-pay

## 2022-03-11 DIAGNOSIS — H0100B Unspecified blepharitis left eye, upper and lower eyelids: Secondary | ICD-10-CM | POA: Diagnosis not present

## 2022-03-11 DIAGNOSIS — G8929 Other chronic pain: Secondary | ICD-10-CM | POA: Insufficient documentation

## 2022-03-11 DIAGNOSIS — H0288B Meibomian gland dysfunction left eye, upper and lower eyelids: Secondary | ICD-10-CM | POA: Diagnosis not present

## 2022-03-11 DIAGNOSIS — H43812 Vitreous degeneration, left eye: Secondary | ICD-10-CM | POA: Diagnosis not present

## 2022-03-11 DIAGNOSIS — H524 Presbyopia: Secondary | ICD-10-CM | POA: Diagnosis not present

## 2022-03-11 DIAGNOSIS — H26493 Other secondary cataract, bilateral: Secondary | ICD-10-CM | POA: Diagnosis not present

## 2022-03-11 DIAGNOSIS — Z961 Presence of intraocular lens: Secondary | ICD-10-CM | POA: Diagnosis not present

## 2022-03-11 DIAGNOSIS — H0288A Meibomian gland dysfunction right eye, upper and lower eyelids: Secondary | ICD-10-CM | POA: Diagnosis not present

## 2022-03-11 DIAGNOSIS — H02834 Dermatochalasis of left upper eyelid: Secondary | ICD-10-CM | POA: Diagnosis not present

## 2022-03-11 DIAGNOSIS — H02831 Dermatochalasis of right upper eyelid: Secondary | ICD-10-CM | POA: Diagnosis not present

## 2022-03-11 DIAGNOSIS — H0100A Unspecified blepharitis right eye, upper and lower eyelids: Secondary | ICD-10-CM | POA: Diagnosis not present

## 2022-03-11 DIAGNOSIS — H52203 Unspecified astigmatism, bilateral: Secondary | ICD-10-CM | POA: Diagnosis not present

## 2022-03-11 DIAGNOSIS — H35363 Drusen (degenerative) of macula, bilateral: Secondary | ICD-10-CM | POA: Diagnosis not present

## 2022-03-11 MED ORDER — ALLOPURINOL 100 MG PO TABS: 50.0000 mg | ORAL_TABLET | Freq: Every day | ORAL | 2 refills | Status: DC

## 2022-03-11 NOTE — Assessment & Plan Note (Signed)
Has been a longstanding problem. unchanged

## 2022-03-11 NOTE — Assessment & Plan Note (Signed)
Recurrent intertrigo. Refill given on Fluconazole to use monthly and prn. Also given a refill on Ketoconazole cream.

## 2022-03-11 NOTE — Assessment & Plan Note (Signed)
Encouraged moist heat and gentle stretching as tolerated. May try NSAIDs and prescription meds as directed and report if symptoms worsen or seek immediate care will consider returning to ortho if worsens

## 2022-03-11 NOTE — Assessment & Plan Note (Signed)
hgba1c acceptable, minimize simple carbs. Increase exercise as tolerated.  

## 2022-03-11 NOTE — Assessment & Plan Note (Signed)
Check UA and culture 

## 2022-03-11 NOTE — Assessment & Plan Note (Signed)
Hydrate and monitor 

## 2022-03-11 NOTE — Assessment & Plan Note (Signed)
Encouraged DASH or MIND diet, decrease po intake and increase exercise as tolerated. Needs 7-8 hours of sleep nightly. Avoid trans fats, eat small, frequent meals every 4-5 hours with lean proteins, complex carbs and healthy fats. Minimize simple carbs, high fat foods and processed foods 

## 2022-03-25 ENCOUNTER — Other Ambulatory Visit: Payer: Self-pay | Admitting: Family Medicine

## 2022-03-30 ENCOUNTER — Other Ambulatory Visit: Payer: Self-pay | Admitting: Family Medicine

## 2022-03-30 DIAGNOSIS — F418 Other specified anxiety disorders: Secondary | ICD-10-CM

## 2022-04-01 ENCOUNTER — Encounter: Payer: Self-pay | Admitting: Family Medicine

## 2022-04-01 DIAGNOSIS — D2272 Melanocytic nevi of left lower limb, including hip: Secondary | ICD-10-CM | POA: Diagnosis not present

## 2022-04-01 DIAGNOSIS — D225 Melanocytic nevi of trunk: Secondary | ICD-10-CM | POA: Diagnosis not present

## 2022-04-01 DIAGNOSIS — D2261 Melanocytic nevi of right upper limb, including shoulder: Secondary | ICD-10-CM | POA: Diagnosis not present

## 2022-04-01 DIAGNOSIS — D1801 Hemangioma of skin and subcutaneous tissue: Secondary | ICD-10-CM | POA: Diagnosis not present

## 2022-04-01 DIAGNOSIS — N481 Balanitis: Secondary | ICD-10-CM | POA: Diagnosis not present

## 2022-04-01 DIAGNOSIS — Z85828 Personal history of other malignant neoplasm of skin: Secondary | ICD-10-CM | POA: Diagnosis not present

## 2022-04-01 DIAGNOSIS — L821 Other seborrheic keratosis: Secondary | ICD-10-CM | POA: Diagnosis not present

## 2022-04-01 DIAGNOSIS — L304 Erythema intertrigo: Secondary | ICD-10-CM | POA: Diagnosis not present

## 2022-04-13 ENCOUNTER — Other Ambulatory Visit: Payer: Self-pay | Admitting: Cardiology

## 2022-04-13 DIAGNOSIS — I480 Paroxysmal atrial fibrillation: Secondary | ICD-10-CM

## 2022-04-13 NOTE — Telephone Encounter (Signed)
Eliquis 5mg  refill request received. Patient is 77 years old, weight-113.9kg, Crea-1.86 on 03/10/22, Diagnosis-Afib, and last seen by Dr. Percival Spanish on 11/20/21. Dose is appropriate based on dosing criteria. Will send in refill to requested pharmacy.

## 2022-04-16 ENCOUNTER — Encounter (HOSPITAL_COMMUNITY)
Admission: RE | Admit: 2022-04-16 | Discharge: 2022-04-16 | Disposition: A | Discharge status: Home / Self Care | Payer: Medicare Other | Source: Ambulatory Visit | Attending: Urology | Admitting: Urology

## 2022-04-16 DIAGNOSIS — R972 Elevated prostate specific antigen [PSA]: Secondary | ICD-10-CM | POA: Insufficient documentation

## 2022-04-16 MED ORDER — COPPER CU 64 DOTATATE 1 MCI/ML IV SOLN: 4.0000 | Freq: Once | INTRAVENOUS | Status: DC

## 2022-04-16 MED ORDER — PIFLIFOLASTAT F 18 (PYLARIFY) INJECTION
9.0000 | Freq: Once | INTRAVENOUS | Status: AC
  Administered 2022-04-16: 9 via INTRAVENOUS

## 2022-05-07 DIAGNOSIS — N183 Chronic kidney disease, stage 3 unspecified: Secondary | ICD-10-CM | POA: Diagnosis not present

## 2022-05-07 DIAGNOSIS — N200 Calculus of kidney: Secondary | ICD-10-CM | POA: Diagnosis not present

## 2022-05-07 DIAGNOSIS — I129 Hypertensive chronic kidney disease with stage 1 through stage 4 chronic kidney disease, or unspecified chronic kidney disease: Secondary | ICD-10-CM | POA: Diagnosis not present

## 2022-05-07 DIAGNOSIS — N1832 Chronic kidney disease, stage 3b: Secondary | ICD-10-CM | POA: Diagnosis not present

## 2022-05-15 DIAGNOSIS — N481 Balanitis: Secondary | ICD-10-CM | POA: Diagnosis not present

## 2022-06-02 ENCOUNTER — Other Ambulatory Visit: Payer: Self-pay | Admitting: Family Medicine

## 2022-06-03 ENCOUNTER — Telehealth: Payer: Self-pay | Admitting: *Deleted

## 2022-06-03 NOTE — Telephone Encounter (Signed)
   Pre-operative Risk Assessment    Patient Name: David [name]  DOB: [birth year] MRN: [id number]      Request for Surgical Clearance    Procedure:   PROSTATE Bx ULTRASOUND  Date of Surgery:  Clearance 07/08/22                                 Surgeon:  DR. Gaylyn Lambert PACE Surgeon's Group or Practice Name:  [facility] Phone number:  [phone] Fax number:  [phone]   Type of Clearance Requested:   - Medical  - Pharmacy:  Hold Apixaban (Eliquis) x 3 DAYS PRIOR   Type of Anesthesia:  Not Indicated   Additional requests/questions:    Elpidio Anis   06/03/2022, 5:44 PM

## 2022-06-03 NOTE — Telephone Encounter (Signed)
Requesting: lorazepam 1mg   Contract: 03/30/2017 UDS: 12/28/2017 Last Visit: 03/10/22 Next Visit: 07/09/22 Last Refill: 11/03/21 #90 and 5RF   Please Advise

## 2022-06-03 NOTE — Telephone Encounter (Signed)
Pharmacy please advise on holding Eliquis prior to prostate biopsy scheduled for 07/08/2022. Thank you.

## 2022-06-04 NOTE — Telephone Encounter (Signed)
   Name: David [name]  DOB: 03/28/1945  MRN: [id number]  Primary Cardiologist: [name], MD   Preoperative team, please contact this patient and set up a phone call appointment for further preoperative risk assessment. Please obtain consent and complete medication review. Thank you for your help.  I confirm that guidance regarding antiplatelet and oral anticoagulation therapy has been completed and, if necessary, noted below.  Per office protocol, patient can hold Eliquis for 3 days prior to procedure as requested.       Napoleon Form, Leodis Rains, NP 06/04/2022, 3:45 PM [facility]

## 2022-06-04 NOTE — Telephone Encounter (Signed)
Patient with diagnosis of afib on Eliquis for anticoagulation.    Procedure: prostate biopsy Date of procedure: 07/08/22  CHA2DS2-VASc Score = 4  This indicates a 4.8% annual risk of stroke. The patient's score is based upon: CHF History: 0 HTN History: 1 Diabetes History: 0 Stroke History: 0 Vascular Disease History: 1 Age Score: 2 Gender Score: 0  CrCl 54mL/min using adj body weight Platelet count 215K  Per office protocol, patient can hold Eliquis for 3 days prior to procedure as requested.    **This guidance is not considered finalized until pre-operative APP has relayed final recommendations.*

## 2022-06-04 NOTE — Telephone Encounter (Signed)
Left message to call back to set up tele pre op appt.  

## 2022-06-05 ENCOUNTER — Telehealth: Payer: Self-pay | Admitting: Family Medicine

## 2022-06-05 NOTE — Telephone Encounter (Signed)
FYI: This call has been transferred to Access Nurse. Once the result note has been entered staff can address the message at that time.  Patient called in with the following symptoms: feeling off, no dizziness/lightheadedness, or blurred vision. Due to holiday weekend and how late in the day it is, didn't want him to not speak to someone clinical  Red Word:elevated blood pressure   Please advise at Mobile [phone] (mobile)  Message is routed to Provider Pool and [facility] Triage

## 2022-06-05 NOTE — Telephone Encounter (Signed)
I lvmtrc and schedule a tele for preop clearance

## 2022-06-06 NOTE — Telephone Encounter (Signed)
Called patient to check on him.  States that blood pressure is now 138/82, 142/82, heart rate 58, he feels well and denies symptoms.  Recommend to reach out if problems or questions.

## 2022-06-09 NOTE — Telephone Encounter (Signed)
Our office has tried x 3 to reach the pt to set up tele pre op appt. I will update the requesting office pt needs tele pre op appt. Will remove from the pre op call back until the pt has returned call to set up appt.

## 2022-06-09 NOTE — Telephone Encounter (Signed)
Initial Comment Caller states his blood pressure was 174/99 heart rate 53. He states it is steady and he has taken his medication. Translation No Disp. Time [name] Time) Disposition Final User 06/05/2022 4:44:23 PM Attempt made - message left Lorenza Chick 06/05/2022 5:27:36 PM Attempt made - message left Lorenza Chick 06/05/2022 5:40:16 PM FINAL ATTEMPT MADE - message left Yes Tennis Ship RN, Clarisse Gouge Final Disposition 06/05/2022 5:40:16 PM FINAL ATTEMPT MADE - message left Yes Tennis Ship, RN, Clarisse Gouge

## 2022-06-29 ENCOUNTER — Ambulatory Visit (INDEPENDENT_AMBULATORY_CARE_PROVIDER_SITE_OTHER): Payer: Medicare Other | Admitting: Medical

## 2022-06-29 VITALS — BP 114/66 | HR 56 | Temp 98.2°F | Resp 18 | Ht 71.0 in | Wt 250.0 lb

## 2022-06-29 DIAGNOSIS — R519 Headache, unspecified: Secondary | ICD-10-CM

## 2022-06-29 DIAGNOSIS — M25551 Pain in right hip: Secondary | ICD-10-CM | POA: Diagnosis not present

## 2022-06-29 DIAGNOSIS — I10 Essential (primary) hypertension: Secondary | ICD-10-CM | POA: Diagnosis not present

## 2022-06-29 DIAGNOSIS — M25562 Pain in left knee: Secondary | ICD-10-CM

## 2022-06-29 DIAGNOSIS — R5383 Other fatigue: Secondary | ICD-10-CM | POA: Diagnosis not present

## 2022-06-29 DIAGNOSIS — M2550 Pain in unspecified joint: Secondary | ICD-10-CM

## 2022-06-29 DIAGNOSIS — H538 Other visual disturbances: Secondary | ICD-10-CM | POA: Diagnosis not present

## 2022-06-29 DIAGNOSIS — F439 Reaction to severe stress, unspecified: Secondary | ICD-10-CM | POA: Diagnosis not present

## 2022-06-29 MED ORDER — FLUTICASONE PROPIONATE 50 MCG/ACT NA SUSP: 2.0000 | Freq: Every day | NASAL | 1 refills | Status: AC

## 2022-06-29 NOTE — Progress Notes (Signed)
Subjective:    Patient ID: David [name], male    DOB: May 12, 1945, 77 y.o.   MRN: [id number]  HPI Discussed the use of AI scribe software for clinical note transcription with the patient, who gave verbal consent to proceed.  History of Present Illness   The patient, with a history of prostate cancer, gout, and vision issues, presents with complaints of frequent headaches over the past two weeks. The headaches are primarily located in the frontal area, more on the left side, and are associated with extensive reading on his phone for about 2 hours only when reading then resolves when he stops reading. The patient has noticed a decline in his vision over the past two months, which he attributes to increased phone usage and stress due to his wife's recent hospitalization.  In addition to the headaches, the patient reports a lack of energy and difficulty sleeping, often lying awake for hours. He also mentions increased nasal drainage, particularly at night.  The patient has a history of prostate cancer, for which he underwent a biopsy and received radioactive seeds about a year and a half ago. Recently, he has noticed a sensation in the area of the prostate and an increase in his PSA levels. He is scheduled for a follow-up with his primary care provider next week.  The patient also reports chronic right hip pain and recent onset left knee pain. He has a history of gout, which has been well-controlled with allopurinol for the past six months.  The patient has been monitoring his blood pressure at home, which has shown sporadic high readings. However, he does not correlate these readings with his headaches. He is currently on lorazepam and fluoxetine for anxiety.        Review of Systems See hpi.    Objective:   Physical Exam  General Mental Status- Alert. General Appearance- Not in acute distress.   Skin General: Color- Normal Color. Moisture- Normal Moisture.  Neck Carotid Arteries-  Normal color. Moisture- Normal Moisture. No carotid bruits. No JVD.  Chest and Lung Exam Auscultation: Breath Sounds:-Normal.  Cardiovascular Auscultation:Rythm- Regular. Murmurs & Other Heart Sounds:Auscultation of the heart reveals- No Murmurs.  Abdomen Inspection:-Inspeection Normal. Palpation/Percussion:Note:No mass. Palpation and Percussion of the abdomen reveal- Non Tender, Non Distended + BS, no rebound or guarding.   Neurologic Cranial Nerve exam:- CN III-XII intact(No nystagmus), symmetric smile. Heal to Toe Gait exam:-Normal. Finger to Nose:- Normal/Intact Strength:- 5/5 equal and symmetric strength both upper and lower extremities.   Rt hip- on rom today no pain. Left knee- on rom no pain today.    Assessment & Plan:   Assessment and Plan    Headache: New onset, intermittent, frontal headaches for two weeks, more on the left side. Associated with prolonged reading on phone and recent visual changes. No current headache. Neurologic exam normal. Possible contributing factors include vision strain, stress, and allergies. -Continue current anxiety medications (Lorazepam and Fluoxetine). -Plan to get adjustable reading glasses. -Start Flonase nasal spray for possible allergy component. -If headache becomes constant, severe, or associated with dramatic vision changes, motor deficits, nausea, vomiting, or extremity weakness, patient should go to the emergency department for a CT scan of the head.  Visual Changes: Noted over the past two months, more blurred vision than usual. History of floaters and cataract surgery. No current spots or light flashes in vision. -Plan to get adjustable reading glasses.  Prostate Cancer: History of prostate cancer treated with radioactive seeds. Recent increase in PSA.  Sensation in prostate area. Follow up  with urologist.    Hypertension: History of hypertension with recent sporadic high readings. Current reading normal. No correlation with  headaches. Bp very well controlled today -Continue current blood pressure medications.  -Bring home blood pressure machine to next appointment with Dr. Rogelia Rohrer for calibration check.  Right Hip Pain and Left Knee Pain: Chronic right hip pain. Recent onset left knee pain after activity. No current pain. -Order right hip and left knee x-rays.  Gout: History of gout, currently controlled with Allopurinol. No recent flares. -Check uric acid level.  Fatigue: Chronic fatigue for two years. Recent difficulty sleeping. -Order labs including TSH, B12, B1, metabolic panel, and CBC.   For insomnia try melatonin 5 mg at night.  -Follow-up with Dr. Rogelia Rohrer next week.   Ramon Dredge Beni Turrell PA-C    Time spent with patient today was  40 minutes which consisted of chart revdiew, discussing diagnosis, work up treatment and documentation.

## 2022-06-29 NOTE — Patient Instructions (Addendum)
Headache: New onset, intermittent, frontal headaches for two weeks, more on the left side. Associated with prolonged reading on phone and recent visual changes. No current headache. Neurologic exam normal. Possible contributing factors include vision strain, stress, and allergies. -Continue current anxiety medications (Lorazepam and Fluoxetine). -Plan to get adjustable reading glasses. -Start Flonase nasal spray for possible allergy component. -If headache becomes constant, severe, or associated with dramatic vision changes, motor deficits, nausea, vomiting, or extremity weakness, patient should go to the emergency department for a CT scan of the head.  Visual Changes: Noted over the past two months, more blurred vision than usual. History of floaters and cataract surgery. No current spots or light flashes in vision. -Plan to get adjustable reading glasses.  Prostate Cancer: History of prostate cancer treated with radioactive seeds. Recent increase in PSA.  Follow up  with urologist.    Hypertension: History of hypertension with recent sporadic high readings. Current reading normal. No correlation with headaches. Bp very well controlled today -Continue current blood pressure medications.  -Bring home blood pressure machine to next appointment with Dr. Rogelia Rohrer for calibration check.  Right Hip Pain and Left Knee Pain: Chronic right hip pain. Recent onset left knee pain after activity. No current pain. -Order right hip and left knee x-rays.  Gout: History of gout, currently controlled with Allopurinol. No recent flares. -Check uric acid level.  Fatigue: Chronic fatigue for two years. Recent difficulty sleeping. -Order labs including TSH, B12, B1, metabolic panel, and CBC.   For insomnia try melatonin 5 mg otc at night.  -Follow-up with Dr. Rogelia Rohrer next week.

## 2022-06-30 LAB — CBC WITH DIFFERENTIAL/PLATELET
Basophils Absolute: 0 10*3/uL (ref 0.0–0.1)
Basophils Relative: 0.5 % (ref 0.0–3.0)
Eosinophils Absolute: 0.3 10*3/uL (ref 0.0–0.7)
Eosinophils Relative: 3.8 % (ref 0.0–5.0)
HCT: 43.3 % (ref 39.0–52.0)
Hemoglobin: 14.3 g/dL (ref 13.0–17.0)
Lymphocytes Relative: 22.9 % (ref 12.0–46.0)
Lymphs Abs: 1.5 10*3/uL (ref 0.7–4.0)
MCHC: 33.1 g/dL (ref 30.0–36.0)
MCV: 96.4 fl (ref 78.0–100.0)
Monocytes Absolute: 0.6 10*3/uL (ref 0.1–1.0)
Monocytes Relative: 8.5 % (ref 3.0–12.0)
Neutro Abs: 4.3 10*3/uL (ref 1.4–7.7)
Neutrophils Relative %: 64.3 % (ref 43.0–77.0)
Platelets: 218 10*3/uL (ref 150.0–400.0)
RBC: 4.49 Mil/uL (ref 4.22–5.81)
RDW: 15.1 % (ref 11.5–15.5)
WBC: 6.7 10*3/uL (ref 4.0–10.5)

## 2022-06-30 LAB — COMPREHENSIVE METABOLIC PANEL
ALT: 37 U/L (ref 0–53)
AST: 25 U/L (ref 0–37)
Albumin: 4.4 g/dL (ref 3.5–5.2)
Alkaline Phosphatase: 73 U/L (ref 39–117)
BUN: 21 mg/dL (ref 6–23)
CO2: 27 mEq/L (ref 19–32)
Calcium: 10.3 mg/dL (ref 8.4–10.5)
Chloride: 103 mEq/L (ref 96–112)
Creatinine, Ser: 1.82 mg/dL — ABNORMAL HIGH (ref 0.40–1.50)
GFR: 35.48 mL/min — ABNORMAL LOW (ref >60.00)
Glucose, Bld: 91 mg/dL (ref 70–99)
Potassium: 4.8 mEq/L (ref 3.5–5.1)
Sodium: 137 mEq/L (ref 135–145)
Total Bilirubin: 0.7 mg/dL (ref 0.2–1.2)
Total Protein: 7.2 g/dL (ref 6.0–8.3)

## 2022-06-30 LAB — IRON: Iron: 80 ug/dL (ref 42–165)

## 2022-06-30 LAB — URIC ACID: Uric Acid, Serum: 7.3 mg/dL (ref 4.0–7.8)

## 2022-06-30 LAB — TSH: TSH: 3.46 u[IU]/mL (ref 0.35–5.50)

## 2022-06-30 LAB — VITAMIN B12: Vitamin B-12: 217 pg/mL (ref 211–911)

## 2022-07-02 ENCOUNTER — Other Ambulatory Visit (HOSPITAL_BASED_OUTPATIENT_CLINIC_OR_DEPARTMENT_OTHER): Payer: Self-pay | Admitting: Medical

## 2022-07-02 ENCOUNTER — Encounter (HOSPITAL_BASED_OUTPATIENT_CLINIC_OR_DEPARTMENT_OTHER): Payer: Self-pay | Admitting: Medical

## 2022-07-02 ENCOUNTER — Ambulatory Visit (HOSPITAL_BASED_OUTPATIENT_CLINIC_OR_DEPARTMENT_OTHER)
Admission: RE | Admit: 2022-07-02 | Discharge: 2022-07-02 | Disposition: A | Discharge status: Home / Self Care | Payer: Medicare Other | Source: Ambulatory Visit | Attending: Medical | Admitting: Medical

## 2022-07-02 DIAGNOSIS — M25511 Pain in right shoulder: Secondary | ICD-10-CM | POA: Insufficient documentation

## 2022-07-02 DIAGNOSIS — M1712 Unilateral primary osteoarthritis, left knee: Secondary | ICD-10-CM | POA: Diagnosis not present

## 2022-07-02 DIAGNOSIS — M25562 Pain in left knee: Secondary | ICD-10-CM | POA: Diagnosis not present

## 2022-07-02 DIAGNOSIS — M25551 Pain in right hip: Secondary | ICD-10-CM | POA: Insufficient documentation

## 2022-07-03 LAB — VITAMIN B1: Vitamin B1 (Thiamine): 18 nmol/L (ref 8–30)

## 2022-07-08 NOTE — Progress Notes (Signed)
Subjective:    Patient ID: David [name], male    DOB: 06-15-1945, 77 y.o.   MRN: [id number]  Chief Complaint  Patient presents with   Follow-up    Follow up    HPI Discussed the use of AI scribe software for clinical note transcription with the patient, who gave verbal consent to proceed.  History of Present Illness   Mr. Patient, a 77 year old male with a history of prostate cancer and kidney removal, presents with concerns about memory problems, joint pain, and urination issues. He recently underwent a biopsy for his prostate cancer and is awaiting the results. He reports that his memory problems are significant enough to warrant evaluation, often forgetting names and struggling to recall specific words. He describes his joint pain as persistent but manageable, affecting his left knee, right hip, lower back, and right shoulder. He has been using Voltaren, Biofreeze, and considering Lidocaine for pain management. He also reports issues with urination, describing it as "tight" and noting a decrease in the length of his penis, which he attributes to a past surgical procedure. He has been managing his health through diet and exercise, specifically walking in grocery stores and reducing his intake of red meat. He acknowledges a preference for sweets and processed foods, which he is trying to curb.        Past Medical History:  Diagnosis Date   Adenoma of right adrenal gland 07/11/2002   2.4cm , noted on CT ABD   Anxiety    Arthritis of both knees 03/26/2016   Astigmatism    Back pain    BCC (basal cell carcinoma of skin)    under right eye and right ear   Blood transfusion without reported diagnosis    Cataract 09/29/2016   Depression    Diverticulitis 2009   Diverticulosis    Family history of breast cancer    Family history of colon cancer    Fatty liver    GERD (gastroesophageal reflux disease)    Gout    Hearing loss of both ears 03/26/2016   History of atrial  fibrillation    x 2 none since 2020   History of chronic prostatitis    started at age 19   History of kidney stones    Hx of adenomatous colonic polyps 02/16/2022   3 diminutive - no recall   Hyperglycemia    Hyperlipidemia    Hypertension    Incomplete right bundle branch block (RBBB)    Internal hemorrhoids    Kidney lesion 06/07/2015   Right midportion, 1.1x1.1 cm hyperechoic, noted on [place] ABD   LAFB (left anterior fascicular block)    Lipoma of axilla 09/2016   Liver lesion, right lobe 06/07/2015   2.5x2.4x2.4 cm hypoechoic lesion posterior aspect, noted on [place] ABD   Low back pain 03/26/2016   LVH (left ventricular hypertrophy) 08/06/2017   Moderate, Noted on ECHO   Medicare annual wellness visit, subsequent 03/12/2014   OA (osteoarthritis)    Back, Hands, Neck   Obesity 11/25/2007   Qualifier: Diagnosis of  By: David Basque MD, David [name]    Other malaise and fatigue 03/13/2013   Premature ventricular complex    Prostate cancer [facility]) dx 2021   Renal insufficiency    Kidney removed right   Scoliosis    Upper thoracic and lumbar   Sinus bradycardia 08/06/2017   Noted on EKG   Vitamin D deficiency    resolved    Past Surgical History:  Procedure  Laterality Date   CARDIOVERSION  07/31/2017   CATARACT EXTRACTION, BILATERAL     COLON SURGERY  2009   segmental sigmoid resection   COLONOSCOPY     COLONOSCOPY WITH PROPOFOL     CYSTOSCOPY N/A 07/28/2019   Procedure: CYSTOSCOPY FLEXIBLE;  Surgeon: David Gauze, MD;  Location: [facility];  Service: Urology;  Laterality: N/A;   CYSTOSCOPY W/ RETROGRADES Right 06/07/2018   Procedure: CYSTOSCOPY WITH RETROGRADE PYELOGRAM, uphrostogram;  Surgeon: David Gauze, MD;  Location: [facility];  Service: Urology;  Laterality: Right;   CYSTOSCOPY WITH URETEROSCOPY AND STENT PLACEMENT Right 02/28/2018   Procedure: CYSTOSCOPY WITH URETEROSCOPY David [name];  Surgeon: David Gully, MD;  Location: [facility];  Service: Urology;  Laterality: Right;   CYSTOSCOPY/URETEROSCOPY/HOLMIUM LASER/STENT PLACEMENT Right 11/29/2017   Procedure: CYSTOSCOPY/RETROGRADE/URETEROSCOPY/HOLMIUM LASER/STENT PLACEMENT;  Surgeon: David Gully, MD;  Location: [facility];  Service: Urology;  Laterality: Right;   EYE SURGERY Bilateral 01/12/2017   cataract removal   history of blood tranfusion  age 89   HOLMIUM LASER APPLICATION Right 09/24/2017   Procedure: HOLMIUM LASER APPLICATION;  Surgeon: David Gully, MD;  Location: [facility];  Service: Urology;  Laterality: Right;   IR BALLOON DILATION URETERAL STRICTURE RIGHT  03/14/2018   IR NEPHRO TUBE REMOV/FL  03/17/2018   IR NEPHROSTOGRAM RIGHT THRU EXISTING ACCESS  03/17/2018   IR NEPHROSTOMY EXCHANGE RIGHT  03/14/2018   IR NEPHROSTOMY EXCHANGE RIGHT  06/21/2018   IR NEPHROSTOMY PLACEMENT RIGHT  03/02/2018   IR NEPHROSTOMY PLACEMENT RIGHT  04/20/2018   IR URETERAL STENT PLACEMENT EXISTING ACCESS RIGHT  03/14/2018   NOSE SURGERY     Submucous resection age 50   NUCLEAR STRESS TEST  06/03/2009   RADIOACTIVE SEED IMPLANT N/A 07/28/2019   Procedure: RADIOACTIVE SEED IMPLANT/BRACHYTHERAPY IMPLANT;  Surgeon: David Gauze, MD;  Location: [facility];  Service: Urology;  Laterality: N/A;  90 MINS   ROBOT ASSISTED LAPAROSCOPIC NEPHRECTOMY Right 08/04/2018   Procedure: XI ROBOTIC ASSISTED LAPAROSCOPIC NEPHRECTOMY;  Surgeon: David Gauze, MD;  Location: [facility];  Service: Urology;  Laterality: Right;  3 HRS   ROBOT ASSISTED PYELOPLASTY Right 06/07/2018   Procedure: attempted XI ROBOTIC ASSISTED PYELOPLASTY, lysis of adhesions;  Surgeon: David Gauze, MD;  Location: [facility];  Service: Urology;  Laterality: Right;  3 HRS   SKIN BIOPSY     SPACE OAR INSTILLATION N/A 07/28/2019   Procedure: SPACE OAR INSTILLATION;  Surgeon: David Gauze, MD;  Location: [facility];  Service: Urology;  Laterality: N/A;    URETEROSCOPY WITH HOLMIUM LASER LITHOTRIPSY Right 09/24/2017   Procedure: CYSTOSCOPY, URETEROSCOPY WITH HOLMIUM LASER LITHOTRIPSY, STENT PLACEMENT;  Surgeon: David Gully, MD;  Location[facility];  Service: Urology;  Laterality: Right;    Family History  Problem Relation Age of Onset   Heart disease Mother    Hypertension Mother    Stroke Mother    Colon cancer Mother 51   Breast cancer Mother 62   Heart disease Father        pacemaker   Aortic aneurysm Father    Hypertension Father    Lung cancer Father 13   Heart disease Sister    Atrial fibrillation Sister    Obesity Sister    Sleep apnea Sister    Heart attack Brother    Other Brother        muscle disease   Arthritis Brother    Stroke Brother  Atrial fibrillation Brother    Heart attack Brother    Atrial fibrillation Brother    Diabetes Brother    Atrial fibrillation Brother    Heart attack Brother    Hypertension Brother    Hyperlipidemia Brother    Heart attack Brother    Other Brother        heart valve operation   Atrial fibrillation Brother    Heart attack Maternal Grandmother    Diabetes Maternal Grandmother    Cancer Maternal Grandfather 44       hodgin's lymphoma   Heart attack Paternal Grandmother    Anxiety disorder Paternal Grandmother    Pneumonia Paternal Grandfather    Liver cancer Nephew 2       great nephew; d. 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[employer]    Employer: [employer]  Tobacco Use   Smoking status: Former    Packs/day: 1.00    Years: 6.00    Additional pack years: 0.00    Total pack years: 6.00    Types: Cigarettes     Start date: 01/11/1969    Quit date: 01/12/1969    Years since quitting: 53.5   Smokeless tobacco: Never   Tobacco comments:    1965-1971  Vaping Use   Vaping Use: Never used  Substance and Sexual Activity   Alcohol use: Not Currently    Comment: 2 whiskey drinks per day   Drug use: Never   Sexual activity: Yes  Other Topics Concern   Not on file  Social History Narrative   The patient is married for the second time, he has 3 sons.   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   2 alcoholic beverages most days.   1 caffeinated beverage daily   No drug use no current tobacco use he is a prior smoker   12/24/2016   Social Determinants of Health   Financial Resource Strain: Low Risk  (02/20/2021)   Overall Financial Resource Strain (CARDIA)    Difficulty of Paying Living Expenses: Not hard at all  Food Insecurity: No Food Insecurity (02/26/2022)   Hunger Vital Sign    Worried About Running Out of Food in the Last Year: Never true    Ran Out of Food in the Last Year: Never true  Transportation Needs: No Transportation Needs (02/26/2022)   PRAPARE - Administrator, Civil Service (Medical): No    Lack of Transportation (Non-Medical): No  Physical Activity: Insufficiently Active (02/20/2021)   Exercise Vital Sign    Days of Exercise per Week: 5 days    Minutes of Exercise per Session: 20 min  Stress: Stress Concern Present (02/20/2021)   [employer] of Occupational Health - Occupational Stress Questionnaire    Feeling of Stress : To some extent  Social Connections: Moderately Isolated (02/20/2021)   Social Connection and Isolation Panel [NHANES]    Frequency of Communication with Friends and Family: More than three times a week    Frequency of Social Gatherings  with Friends and Family: Twice a week    Attends Religious Services: Never    Database administrator or Organizations: No    Attends Banker Meetings: Never    Marital Status: Married  Careers information officer Violence: Not At Risk (02/26/2022)   Humiliation, Afraid, Rape, and Kick questionnaire    Fear of Current or Ex-Partner: No    Emotionally Abused: No    Physically Abused: No    Sexually Abused: No    Outpatient Medications Prior to Visit  Medication Sig Dispense Refill   allopurinol (ZYLOPRIM) 100 MG tablet Take 0.5 tablets (50 mg total) by mouth daily. 45 tablet 1   ascorbic acid (VITAMIN C) 1000 MG tablet Take by mouth.     atenolol (TENORMIN) 50 MG tablet Take 1 tablet (50 mg total) by mouth 2 (two) times daily. 60 tablet 5   atorvastatin (LIPITOR) 10 MG tablet TAKE ONE TABLET BY MOUTH EVERY MORNING 90 tablet 1   buPROPion (WELLBUTRIN XL) 150 MG 24 hr tablet Take 1 tablet (150 mg total) by mouth at bedtime. 30 tablet 3   Cholecalciferol (VITAMIN D-1000 MAX ST) 25 MCG (1000 UT) tablet Take by mouth.     clotrimazole-betamethasone (LOTRISONE) lotion Apply topically 2 (two) times daily. For 2 weeks. (Patient taking differently: Apply topically daily. For 2 weeks.) 30 mL 0   colchicine 0.6 MG tablet For gout flares, take 1 tab po once then repeat in 1 hour as needed if pain persists up to a total of 3 doses 15 tablet 0   ELIQUIS 5 MG TABS tablet TAKE 1 TABLET BY MOUTH TWICE A DAY 60 tablet 5   fluconazole (DIFLUCAN) 150 MG tablet 1 tab po monthly and prn dermatitis 10 tablet 2   FLUoxetine (PROZAC) 10 MG tablet Take 1 tablet (10 mg total) by mouth 3 (three) times daily. 270 tablet 1   fluticasone (FLONASE) 50 MCG/ACT nasal spray Place 2 sprays into both nostrils daily. 16 g 0   fluticasone (FLONASE) 50 MCG/ACT nasal spray Place 2 sprays into both nostrils daily. 16 g 1   ketoconazole (NIZORAL) 2 % cream Apply 1 Application topically daily. 60 g 2   LORazepam (ATIVAN) 1 MG tablet TAKE ONE TABLET BY MOUTH EVERY 8 HOURS AS NEEDED FOR ANXIETY 90 tablet 1   meclizine (ANTIVERT) 25 MG tablet Take 1 tablet (25 mg total) by mouth 3 (three) times daily as needed for up to 30 doses for  dizziness. 30 tablet 0   omeprazole (PRILOSEC) 20 MG capsule Take 20 mg by mouth daily as needed (acid reflux).      ondansetron (ZOFRAN) 4 MG tablet Take 1 tablet (4 mg total) by mouth every 6 (six) hours. 12 tablet 0   triamcinolone (KENALOG) 0.025 % cream SMARTSIG:1 Topical Daily     No facility-administered medications prior to visit.    Allergies  Allergen Reactions   Cefaclor Hives   Cephalosporins Hives   Penicillins Rash    Mild maculopapular rash Has patient had a PCN reaction causing immediate rash, facial/tongue/throat swelling, SOB or lightheadedness with hypotension: No Has patient had a PCN reaction causing severe rash involving mucus membranes or skin necrosis: No Has patient had a PCN reaction that required hospitalization: No Has patient had a PCN reaction occurring within the last 10 years: No If all of the above answers are "NO", then may proceed with Cephalosporin use.     Review of Systems  Constitutional:  Positive for malaise/fatigue.  Negative for fever.  HENT:  Negative for congestion.   Eyes:  Negative for blurred vision.  Respiratory:  Negative for shortness of breath.   Cardiovascular:  Negative for chest pain, palpitations and leg swelling.  Gastrointestinal:  Negative for abdominal pain, blood in stool and nausea.  Genitourinary:  Positive for frequency. Negative for dysuria, flank pain, hematuria and urgency.  Musculoskeletal:  Positive for back pain and joint pain. Negative for falls.  Skin:  Negative for rash.  Neurological:  Negative for dizziness, loss of consciousness and headaches.  Endo/Heme/Allergies:  Negative for environmental allergies.  Psychiatric/Behavioral:  Positive for memory loss. Negative for depression. The patient is nervous/anxious.        Objective:    Physical Exam Vitals reviewed.  Constitutional:      Appearance: Normal appearance. He is not ill-appearing.  HENT:     Head: Normocephalic and atraumatic.     Nose:  Nose normal.  Eyes:     Conjunctiva/sclera: Conjunctivae normal.  Cardiovascular:     Rate and Rhythm: Normal rate.     Pulses: Normal pulses.     Heart sounds: Normal heart sounds. No murmur heard. Pulmonary:     Effort: Pulmonary effort is normal.     Breath sounds: Normal breath sounds. No wheezing.  Abdominal:     Palpations: Abdomen is soft. There is no mass.     Tenderness: There is no abdominal tenderness.  Musculoskeletal:     Cervical back: Normal range of motion.     Right lower leg: No edema.     Left lower leg: No edema.  Skin:    General: Skin is warm and dry.  Neurological:     General: No focal deficit present.     Mental Status: He is alert and oriented to person, place, and time.  Psychiatric:        Mood and Affect: Mood normal.     BP 128/74 (BP Location: Left Arm, Patient Position: Sitting, Cuff Size: Normal)   Pulse 73   Temp 98 F (36.7 C) (Oral)   Resp 16   Ht 5\' 11"  (1.803 m)   Wt 250 lb 6.4 oz (113.6 kg)   SpO2 95%   BMI 34.92 kg/m  Wt Readings from Last 3 Encounters:  07/09/22 250 lb 6.4 oz (113.6 kg)  06/29/22 250 lb (113.4 kg)  03/10/22 251 lb (113.9 kg)    Diabetic Foot Exam - Simple   No data filed    Lab Results  Component Value Date   WBC 6.7 06/29/2022   HGB 14.3 06/29/2022   HCT 43.3 06/29/2022   PLT 218.0 06/29/2022   GLUCOSE 91 06/29/2022   CHOL 172 03/10/2022   TRIG 142.0 03/10/2022   HDL 37.40 (L) 03/10/2022   LDLDIRECT 168.5 03/29/2006   LDLCALC 106 (H) 03/10/2022   ALT 37 06/29/2022   AST 25 06/29/2022   NA 137 06/29/2022   K 4.8 06/29/2022   CL 103 06/29/2022   CREATININE 1.82 (H) 06/29/2022   BUN 21 06/29/2022   CO2 27 06/29/2022   TSH 3.46 06/29/2022   PSA 4.01 (H) 07/16/2021   INR 1.0 07/25/2019   HGBA1C 5.8 03/10/2022    Lab Results  Component Value Date   TSH 3.46 06/29/2022   Lab Results  Component Value Date   WBC 6.7 06/29/2022   HGB 14.3 06/29/2022   HCT 43.3 06/29/2022   MCV 96.4  06/29/2022   PLT 218.0 06/29/2022   Lab Results  Component Value Date   NA 137 06/29/2022   K 4.8 06/29/2022   CO2 27 06/29/2022   GLUCOSE 91 06/29/2022   BUN 21 06/29/2022   CREATININE 1.82 (H) 06/29/2022   BILITOT 0.7 06/29/2022   ALKPHOS 73 06/29/2022   AST 25 06/29/2022   ALT 37 06/29/2022   PROT 7.2 06/29/2022   ALBUMIN 4.4 06/29/2022   CALCIUM 10.3 06/29/2022   ANIONGAP 6 12/13/2021   EGFR 37 (L) 12/18/2021   GFR 35.48 (L) 06/29/2022   Lab Results  Component Value Date   CHOL 172 03/10/2022   Lab Results  Component Value Date   HDL 37.40 (L) 03/10/2022   Lab Results  Component Value Date   LDLCALC 106 (H) 03/10/2022   Lab Results  Component Value Date   TRIG 142.0 03/10/2022   Lab Results  Component Value Date   CHOLHDL 5 03/10/2022   Lab Results  Component Value Date   HGBA1C 5.8 03/10/2022       Assessment & Plan:  Abnormal TSH  Abnormal liver function  Chronic renal impairment, unspecified CKD stage  Chronic gout without tophus, unspecified cause, unspecified site -     Uric acid; Future  Hyperglycemia -     Lipid panel; Future -     Hemoglobin A1c; Future  Essential hypertension, benign -     Lipid panel; Future -     Comprehensive metabolic panel; Future -     CBC with Differential/Platelet; Future -     TSH; Future  Memory loss -     Ambulatory referral to Neurology  Obstruction of ureter, unspecified laterality Assessment & Plan: He describes a sense of the meatus being diminished and tight causing urinary hesitancy. If this continues to worsen he will follow up with urology   Right hip pain Assessment & Plan: Encouraged moist heat and gentle stretching as tolerated. May try NSAIDs and prescription meds as directed and report if symptoms worsen or seek immediate care Tizanidine prn   Memory change Assessment & Plan: Memory is intact in office. Oriented to person, place and time but he is concerned. He is still performing  ADLs well but he is the caregiver for his wife so he is worred about caring for them. Referred to neurology for consideration   Other orders -     tiZANidine HCl; Take 0.5-2 tablets (1-4 mg total) by mouth every 8 (eight) hours as needed for muscle spasms (joint pain).  Dispense: 40 tablet; Refill: 1    Assessment and Plan    Prostate Cancer: Slow growing, recent biopsy performed. Minimal pain reported. -Follow-up on biopsy results.  Musculoskeletal Pain: Multiple areas of pain including left knee, right hip, low back, and right shoulder. Pain is persistent but manageable. Currently using Voltaren, Biofreeze, and considering Lidocaine. -Prescribe Tizanidine 2mg  up to three times a day for muscle and joint pain. -Consider consultation with a specialist if pain worsens.  Memory Concerns: Patient reports difficulty with word recall and general forgetfulness. No reported issues with basic tasks such as bill paying or driving. -Refer to neurology for comprehensive memory evaluation.  Urinary Issues: Difficulty with urination, possible phimosis. History of kidney removal. -Recommend discussion with urology regarding urinary difficulties and possible surgical intervention.  General Health Maintenance: Stable weight, attempts to increase vegetable intake and decrease red meat consumption. Concerns about protein intake. -Encourage continued balanced diet with adequate protein sources. -Schedule lab work in 1-2 weeks to monitor overall health. -Schedule follow-up visit in 4-8 weeks.  Penni Homans, MD

## 2022-07-09 ENCOUNTER — Ambulatory Visit (INDEPENDENT_AMBULATORY_CARE_PROVIDER_SITE_OTHER): Payer: Medicare Other | Admitting: Family Medicine

## 2022-07-09 VITALS — BP 128/74 | HR 73 | Temp 98.0°F | Resp 16 | Ht 71.0 in | Wt 250.4 lb

## 2022-07-09 DIAGNOSIS — M25551 Pain in right hip: Secondary | ICD-10-CM | POA: Diagnosis not present

## 2022-07-09 DIAGNOSIS — R739 Hyperglycemia, unspecified: Secondary | ICD-10-CM

## 2022-07-09 DIAGNOSIS — N189 Chronic kidney disease, unspecified: Secondary | ICD-10-CM | POA: Diagnosis not present

## 2022-07-09 DIAGNOSIS — R7989 Other specified abnormal findings of blood chemistry: Secondary | ICD-10-CM

## 2022-07-09 DIAGNOSIS — M1A9XX0 Chronic gout, unspecified, without tophus (tophi): Secondary | ICD-10-CM | POA: Diagnosis not present

## 2022-07-09 DIAGNOSIS — R413 Other amnesia: Secondary | ICD-10-CM | POA: Diagnosis not present

## 2022-07-09 DIAGNOSIS — I10 Essential (primary) hypertension: Secondary | ICD-10-CM

## 2022-07-09 DIAGNOSIS — R945 Abnormal results of liver function studies: Secondary | ICD-10-CM | POA: Diagnosis not present

## 2022-07-09 DIAGNOSIS — N135 Crossing vessel and stricture of ureter without hydronephrosis: Secondary | ICD-10-CM | POA: Diagnosis not present

## 2022-07-09 MED ORDER — TIZANIDINE HCL 2 MG PO TABS: 1.0000 mg | ORAL_TABLET | Freq: Three times a day (TID) | ORAL | 1 refills | Status: DC | PRN

## 2022-07-09 NOTE — Patient Instructions (Signed)

## 2022-07-12 ENCOUNTER — Encounter: Payer: Self-pay | Admitting: Family Medicine

## 2022-07-12 DIAGNOSIS — R4189 Other symptoms and signs involving cognitive functions and awareness: Secondary | ICD-10-CM | POA: Insufficient documentation

## 2022-07-12 DIAGNOSIS — F03B3 Unspecified dementia, moderate, with mood disturbance: Secondary | ICD-10-CM | POA: Insufficient documentation

## 2022-07-12 DIAGNOSIS — R413 Other amnesia: Secondary | ICD-10-CM | POA: Insufficient documentation

## 2022-07-12 NOTE — Assessment & Plan Note (Signed)
Encouraged moist heat and gentle stretching as tolerated. May try NSAIDs and prescription meds as directed and report if symptoms worsen or seek immediate care. Tizanidine prn 

## 2022-07-12 NOTE — Assessment & Plan Note (Signed)
Memory is intact in office. Oriented to person, place and time but he is concerned. He is still performing ADLs well but he is the caregiver for his wife so he is worred about caring for them. Referred to neurology for consideration

## 2022-07-12 NOTE — Assessment & Plan Note (Signed)
He describes a sense of the meatus being diminished and tight causing urinary hesitancy. If this continues to worsen he will follow up with urology

## 2022-07-13 ENCOUNTER — Telehealth: Payer: Self-pay | Admitting: Family Medicine

## 2022-07-13 ENCOUNTER — Encounter: Payer: Self-pay | Admitting: Radiation Oncology

## 2022-07-13 NOTE — Telephone Encounter (Signed)
Pt's son, [name] (on [place]) called to see if he could speak with Dr. Abner Greenspan or someone regarding father's visit last week. He said that father mentioned he saw lab results that shows his Prostrate cancer is back. [name] called urologist and they said that he has not seen any lab results with their office. [name] is just curious what happened during the visit with Dr. Abner Greenspan that would have him leaving the appointment texting the family that the cancer is back. [name] is worried that maybe he isn't remembering things correctly or maybe he is stressed about the chance it's back  so he is stepping in to get some clarity . Please call him at [phone] to discuss.

## 2022-07-13 NOTE — Telephone Encounter (Signed)
Son ok with leaving information on his voicemail if he does not answer.

## 2022-07-14 NOTE — Telephone Encounter (Signed)
Pt son notified.  He will try to make next appts and listen in on calls.

## 2022-07-17 ENCOUNTER — Other Ambulatory Visit: Payer: Self-pay | Admitting: Family Medicine

## 2022-07-20 ENCOUNTER — Telehealth: Payer: Self-pay | Admitting: Radiation Oncology

## 2022-07-20 NOTE — Telephone Encounter (Signed)
LVM for wife Ginger (as instructed) to c/b to schedule RECON for pt.

## 2022-07-21 ENCOUNTER — Telehealth: Payer: Self-pay | Admitting: Radiation Oncology

## 2022-07-21 NOTE — Telephone Encounter (Signed)
Pt returned call for scheduling. Appt made, pt request text reminder for appt, pt advised I am unable to send text reminders manually. Pt agreed to me leaving a VM on HIS line for the appt. After making appt with pt, I called pt and LVM for appt reminder with appt details as requested by pt.

## 2022-07-21 NOTE — Telephone Encounter (Signed)
LVM for wife Ginger (as instructed) to c/b to schedule RECON for pt.

## 2022-07-22 ENCOUNTER — Telehealth: Payer: Self-pay | Admitting: Licensed Clinical Social Worker

## 2022-07-22 NOTE — Patient Instructions (Signed)
  It was a pleasure speaking with you today. Per your request a Care Coordination phone appointment is scheduled 07/27/22  Sammuel Hines, LCSW Social Work Care Coordination  [phone]

## 2022-07-22 NOTE — Patient Outreach (Signed)
  Care Coordination  Initial Visit Note   07/22/2022 Name: David [name] MRN: [id number] DOB: [birth year]  David [name] is a 76 y.o. year old male who sees [name], MD for primary care. I spoke with  [name] by phone today.  What matters to the patients health and wellness today?    Patient scheduled phone appointment to obtain additional information about the Care Coordination Program..   SDOH assessments and interventions completed:  No   Care Coordination Interventions:  No, not indicated   Follow up plan: Follow up call scheduled for 07/27/22    Encounter Outcome:  Pt. Visit Completed   Sammuel Hines, LCSW Social Work Care Coordination  [facility] [employer] [phone]

## 2022-07-23 ENCOUNTER — Other Ambulatory Visit (INDEPENDENT_AMBULATORY_CARE_PROVIDER_SITE_OTHER): Payer: Medicare Other

## 2022-07-23 DIAGNOSIS — R739 Hyperglycemia, unspecified: Secondary | ICD-10-CM

## 2022-07-23 DIAGNOSIS — M1A9XX0 Chronic gout, unspecified, without tophus (tophi): Secondary | ICD-10-CM | POA: Diagnosis not present

## 2022-07-23 DIAGNOSIS — I10 Essential (primary) hypertension: Secondary | ICD-10-CM

## 2022-07-24 LAB — LIPID PANEL
Cholesterol: 152 mg/dL (ref 0–200)
HDL: 33.5 mg/dL — ABNORMAL LOW (ref >39.00)
LDL Cholesterol: 85 mg/dL (ref 0–99)
NonHDL: 118.91
Total CHOL/HDL Ratio: 5
Triglycerides: 168 mg/dL — ABNORMAL HIGH (ref 0.0–149.0)
VLDL: 33.6 mg/dL (ref 0.0–40.0)

## 2022-07-24 LAB — CBC WITH DIFFERENTIAL/PLATELET
Basophils Absolute: 0 10*3/uL (ref 0.0–0.1)
Basophils Relative: 0.6 % (ref 0.0–3.0)
Eosinophils Absolute: 0.3 10*3/uL (ref 0.0–0.7)
Eosinophils Relative: 4.1 % (ref 0.0–5.0)
HCT: 41.8 % (ref 39.0–52.0)
Hemoglobin: 13.8 g/dL (ref 13.0–17.0)
Lymphocytes Relative: 27.3 % (ref 12.0–46.0)
Lymphs Abs: 1.8 10*3/uL (ref 0.7–4.0)
MCHC: 33 g/dL (ref 30.0–36.0)
MCV: 96.4 fl (ref 78.0–100.0)
Monocytes Absolute: 0.5 10*3/uL (ref 0.1–1.0)
Monocytes Relative: 8 % (ref 3.0–12.0)
Neutro Abs: 4 10*3/uL (ref 1.4–7.7)
Neutrophils Relative %: 60 % (ref 43.0–77.0)
Platelets: 219 10*3/uL (ref 150.0–400.0)
RBC: 4.33 Mil/uL (ref 4.22–5.81)
RDW: 14.9 % (ref 11.5–15.5)
WBC: 6.6 10*3/uL (ref 4.0–10.5)

## 2022-07-24 LAB — COMPREHENSIVE METABOLIC PANEL
ALT: 26 U/L (ref 0–53)
AST: 24 U/L (ref 0–37)
Albumin: 4 g/dL (ref 3.5–5.2)
Alkaline Phosphatase: 76 U/L (ref 39–117)
BUN: 19 mg/dL (ref 6–23)
CO2: 26 mEq/L (ref 19–32)
Calcium: 10.3 mg/dL (ref 8.4–10.5)
Chloride: 104 mEq/L (ref 96–112)
Creatinine, Ser: 1.82 mg/dL — ABNORMAL HIGH (ref 0.40–1.50)
GFR: 35.46 mL/min — ABNORMAL LOW (ref >60.00)
Glucose, Bld: 110 mg/dL — ABNORMAL HIGH (ref 70–99)
Potassium: 3.9 mEq/L (ref 3.5–5.1)
Sodium: 138 mEq/L (ref 135–145)
Total Bilirubin: 0.7 mg/dL (ref 0.2–1.2)
Total Protein: 6.5 g/dL (ref 6.0–8.3)

## 2022-07-24 LAB — HEMOGLOBIN A1C: Hgb A1c MFr Bld: 5.7 % (ref 4.6–6.5)

## 2022-07-24 LAB — TSH: TSH: 3.25 u[IU]/mL (ref 0.35–5.50)

## 2022-07-24 LAB — URIC ACID: Uric Acid, Serum: 7 mg/dL (ref 4.0–7.8)

## 2022-07-27 ENCOUNTER — Telehealth: Payer: Self-pay | Admitting: Licensed Clinical Social Worker

## 2022-07-27 ENCOUNTER — Encounter: Payer: Self-pay | Admitting: Licensed Clinical Social Worker

## 2022-07-27 NOTE — Patient Outreach (Signed)
  Care Coordination   07/27/2022 Name: David [name] MRN: [id number] DOB: 1945/06/12   Care Coordination Outreach Attempts:  An unsuccessful telephone outreach was attempted for a scheduled appointment today.  Follow Up Plan:  Additional outreach attempts will be made to offer the patient care coordination information and services.   Encounter Outcome:  No Answer   Care Coordination Interventions:  No, not indicated    Sammuel Hines, LCSW Social Work Care Coordination  [facility] [employer] [phone]

## 2022-07-27 NOTE — Patient Instructions (Signed)
  I am sorry you were unable to keep your phone appointment today.   Please call me to reschedule  Deborah Moore, LCSW Social Work Care Coordination  [phone]  

## 2022-07-28 ENCOUNTER — Ambulatory Visit: Payer: Self-pay | Admitting: Licensed Clinical Social Worker

## 2022-07-28 NOTE — Patient Instructions (Signed)
  It was a pleasure speaking with you today. Per your request a Care Coordination phone appointment is scheduled 07/30/22  Sammuel Hines, LCSW Social Work Care Coordination  [phone]

## 2022-07-28 NOTE — Patient Outreach (Signed)
  Care Coordination  Initial Visit Note   07/28/2022 Name: SHAWAN CORELLA MRN: [id number] DOB: [birth year]  ELDRIGE PITKIN is a 77 y.o. year old male who sees [name], MD for primary care. I spoke with  [name] by phone today. Returned call from patient. What matters to the patients health and wellness today?    Patient scheduled phone appointment to obtain additional information about the Care Coordination Program..    SDOH assessments and interventions completed:  No   Care Coordination Interventions:  No, not indicated   Sammuel Hines, LCSW Social Work Care Coordination  [facility] [phone]   Follow up plan: Follow up call scheduled for 07/30/22    Encounter Outcome:  Pt. Visit Completed

## 2022-07-30 ENCOUNTER — Telehealth: Payer: Self-pay | Admitting: Radiation Oncology

## 2022-07-30 ENCOUNTER — Ambulatory Visit: Payer: Self-pay | Admitting: Licensed Clinical Social Worker

## 2022-07-30 NOTE — Patient Outreach (Signed)
  Care Coordination  Initial Visit Note   07/30/2022 Name: David [name] MRN: [id number] DOB: December 24, 1945  David [name] is a 77 y.o. year old male who sees [name], MD for primary care. I spoke with  [name] by phone today.  What matters to the patients health and wellness today?  Reviewed care coordination program with patient    Goals Addressed             This Visit's Progress    manage stress and healthy life style changes       Activities and task to complete in order to accomplish goals.   Keep all upcoming appointment discussed today Continue with compliance of taking medication prescribed by Doctor We reviewed care coordination services, you have decided to focus on your mental health at this time         SDOH assessments and interventions completed: Patient reports changed but did not want to discuss today Yes   Care Coordination Interventions:  Yes, provided  Interventions Today    Flowsheet Row Most Recent Value  Chronic Disease   Chronic disease during today's visit Hypertension (HTN), Chronic Kidney Disease/End Stage Renal Disease (ESRD)  General Interventions   General Interventions Discussed/Reviewed General Interventions Discussed  [reviewed care coordination program]  Education Interventions   Education Provided Provided Education  Mental Health Interventions   Mental Health Discussed/Reviewed Mental Health Discussed  [would like to focus on stress]       Follow up plan:  Patient states will call back to schedule f/u call.  LCSW will f/u if no returned call in 7 to 10 days.    Encounter Outcome:  Pt. Visit Completed   Sammuel Hines, LCSW Social Work Care Coordination  [facility] [employer] [phone]

## 2022-07-30 NOTE — Patient Instructions (Signed)
Social Work Visit Information  Thank you for taking time to visit with me today. Please don't hesitate to contact me if I can be of assistance to you.   Following are the goals we discussed today:   Goals Addressed             This Visit's Progress    manage stress and healthy life style changes       Activities and task to complete in order to accomplish goals.   Keep all upcoming appointment discussed today Continue with compliance of taking medication prescribed by Doctor We reviewed care coordination services, you have decided to focus on your mental health at this time          No follow up scheduled with social work at this time. Will follow up in 7 to 10 days .Patient will call office if needed prior to next encounter.  Please call the care guide team at [phone] if you need to cancel or reschedule your appointment.   If you or anyone you know are experiencing a Mental Health or Behavioral Health Crisis or need someone to talk to, please call the Suicide and Crisis Lifeline: 988 call the [place] National Suicide Prevention Lifeline: 252-005-8239 or TTY: (604)109-7309 TTY 503-576-0646) to talk to a trained counselor call 1-800-273-TALK (toll free, 24 hour hotline) go to [facility] [address] [place] [phone])   Patient verbalizes understanding of instructions and care plan provided today and agrees to view in MyChart. Active MyChart status and patient understanding of how to access instructions and care plan via MyChart confirmed with patient.       Sammuel Hines, LCSW Social Work Care Coordination  [facility] [employer] [employer] [phone]

## 2022-07-30 NOTE — Progress Notes (Signed)
GU Location of Tumor / Histology: Prostate Ca  If Prostate Cancer, Gleason Score is (4 + 4) and PSA is (7.10 05/16/2022) prostate cancer in 12/12 cores.    Biopsy    04/16/2022 Dr. Kasandra Knudsen NM PET (PSMA) Skull to Mid Thigh CLINICAL DATA:  Prostate cancer, status post radiation seed implant.  Rising PSA of 4.9 in December.  IMPRESSION: 1. Low-level, relatively diffuse tracer affinity throughout the prostate is decreased since 11/11/2020. Residual or recurrent local disease cannot be excluded. 2. No evidence of tracer avid nodal, osseous, or distant metastasis. 3. Ascending aortic dilatation at 4.3 cm. Please see 11/17/2021 dedicated CTA. 4. Incidental findings, including: Coronary artery atherosclerosis. Aortic Atherosclerosis (ICD10-I70.0). Left nephrolithiasis. Hepatic steatosis.  Past/Anticipated interventions by urology, if any:   Dr. Kasandra Knudsen    Past/Anticipated interventions by medical oncology, if any: NA  Weight changes, if any: {:18581}  IPSS: SHIM:  Bowel/Bladder complaints, if any: {:18581}   Nausea/Vomiting, if any: {:18581}  Pain issues, if any:  {:18581}  SAFETY ISSUES: Prior radiation? Brachytherapy 07/2019 Pacemaker/ICD? {:18581} Possible current pregnancy? Male Is the patient on methotrexate? No  Current Complaints / other details:

## 2022-07-30 NOTE — Telephone Encounter (Signed)
7/18 @ 3:40 pm received call from Minna Antis to get patient reschedule from 7/25 to 7/22 in Dr. Kathrynn Running 1:00 pm slot, due to being in pre-seed -per Ashlyn Bruning.

## 2022-08-02 NOTE — Progress Notes (Signed)
Radiation Oncology         [phone] ________________________________  Re-Consultation  Name: David [name] MRN: [id number]  Date of Service: 08/03/2022 DOB: 11-25-1945  WC:BJSEG, Bryon Lions, MD  Noel Christmas, MD   REFERRING PHYSICIAN: Noel Christmas, MD  DIAGNOSIS: {There were no encounter diagnoses. (Refresh or delete this SmartLink)}  No diagnosis found.  HISTORY OF PRESENT ILLNESS: David [name] is a 77 y.o. male seen at the request of Dr. Levon Hedger. He was initially diagnosed with Gleason 4+3 prostate cancer in 03/2019 by Dr. Ronne Binning, with a pre-treatment PSA of 5.53. We met the patient on 04/28/19 to discuss his radiation treatment options. At that time, he opted to proceed with radioactive seed implant, which was performed on 07/28/19. Of note, he also underwent genetic testing on 09/04/19, which was negative. Following treatment, his PSA reached a nadir of 2.46 in 01/2020. Unfortunately, his PSA rose again, prompting a restaging PSMA PET scan in 10/2020. This showed uptake in the prostate, and the patient was taken for a repeat biopsy on 03/21/21. Pathology results showed treatment effect only, no active prostate cancer.  More recently, his PSA reached 4.9 in 12/2021. He underwent surveillance PSMA PET scan on 04/16/22, which showed: persistent tracer affinity throughout the prostate, but decreased from prior PET; no evidence of tracer-avid nodal, osseous, or distant metastasis. Following this, his PSA rose sharply to 7.1 in 05/2022. This prompted a repeat prostate biopsy on 07/08/22 under Dr. Arita Miss. Results revealed upgraded disease with Gleason 4+4 disease present in all 12 cores. He was started on ADT with Eligard on 07/15/22.  PREVIOUS RADIATION THERAPY: Yes  07/28/19: Prostate / Brachytherapy, radioactive seed implant  PAST MEDICAL HISTORY:  Past Medical History:  Diagnosis Date   Adenoma of right adrenal gland 07/11/2002   2.4cm , noted on CT ABD   Anxiety    Arthritis of  both knees 03/26/2016   Astigmatism    Back pain    BCC (basal cell carcinoma of skin)    under right eye and right ear   Blood transfusion without reported diagnosis    Cataract 09/29/2016   Depression    Diverticulitis 2009   Diverticulosis    Family history of breast cancer    Family history of colon cancer    Fatty liver    GERD (gastroesophageal reflux disease)    Gout    Hearing loss of both ears 03/26/2016   History of atrial fibrillation    x 2 none since 2020   History of chronic prostatitis    started at age 62   History of kidney stones    Hx of adenomatous colonic polyps 02/16/2022   3 diminutive - no recall   Hyperglycemia    Hyperlipidemia    Hypertension    Incomplete right bundle branch block (RBBB)    Internal hemorrhoids    Kidney lesion 06/07/2015   Right midportion, 1.1x1.1 cm hyperechoic, noted on [place] ABD   LAFB (left anterior fascicular block)    Lipoma of axilla 09/2016   Liver lesion, right lobe 06/07/2015   2.5x2.4x2.4 cm hypoechoic lesion posterior aspect, noted on [place] ABD   Low back pain 03/26/2016   LVH (left ventricular hypertrophy) 08/06/2017   Moderate, Noted on ECHO   Medicare annual wellness visit, subsequent 03/12/2014   OA (osteoarthritis)    Back, Hands, Neck   Obesity 11/25/2007   Qualifier: Diagnosis of  By: Kriste Basque MD, Lonzo Cloud    Other malaise and  fatigue 03/13/2013   Premature ventricular complex    Prostate cancer (HCC) dx 2021   Renal insufficiency    Kidney removed right   Scoliosis    Upper thoracic and lumbar   Sinus bradycardia 08/06/2017   Noted on EKG   Vitamin D deficiency    resolved      PAST SURGICAL HISTORY: Past Surgical History:  Procedure Laterality Date   CARDIOVERSION  07/31/2017   CATARACT EXTRACTION, BILATERAL     COLON SURGERY  2009   segmental sigmoid resection   COLONOSCOPY     COLONOSCOPY WITH PROPOFOL     CYSTOSCOPY N/A 07/28/2019   Procedure: CYSTOSCOPY FLEXIBLE;  Surgeon: Malen Gauze, MD;  Location: [facility];  Service: Urology;  Laterality: N/A;   CYSTOSCOPY W/ RETROGRADES Right 06/07/2018   Procedure: CYSTOSCOPY WITH RETROGRADE PYELOGRAM, uphrostogram;  Surgeon: Malen Gauze, MD;  Location: [facility];  Service: Urology;  Laterality: Right;   CYSTOSCOPY WITH URETEROSCOPY AND STENT PLACEMENT Right 02/28/2018   Procedure: CYSTOSCOPY WITH URETEROSCOPY Iverson Alamin;  Surgeon: Ihor Gully, MD;  Location: [facility];  Service: Urology;  Laterality: Right;   CYSTOSCOPY/URETEROSCOPY/HOLMIUM LASER/STENT PLACEMENT Right 11/29/2017   Procedure: CYSTOSCOPY/RETROGRADE/URETEROSCOPY/HOLMIUM LASER/STENT PLACEMENT;  Surgeon: Ihor Gully, MD;  Location: [facility];  Service: Urology;  Laterality: Right;   EYE SURGERY Bilateral 01/12/2017   cataract removal   history of blood tranfusion  age 89   HOLMIUM LASER APPLICATION Right 09/24/2017   Procedure: HOLMIUM LASER APPLICATION;  Surgeon: Ihor Gully, MD;  Location: [facility];  Service: Urology;  Laterality: Right;   IR BALLOON DILATION URETERAL STRICTURE RIGHT  03/14/2018   IR NEPHRO TUBE REMOV/FL  03/17/2018   IR NEPHROSTOGRAM RIGHT THRU EXISTING ACCESS  03/17/2018   IR NEPHROSTOMY EXCHANGE RIGHT  03/14/2018   IR NEPHROSTOMY EXCHANGE RIGHT  06/21/2018   IR NEPHROSTOMY PLACEMENT RIGHT  03/02/2018   IR NEPHROSTOMY PLACEMENT RIGHT  04/20/2018   IR URETERAL STENT PLACEMENT EXISTING ACCESS RIGHT  03/14/2018   NOSE SURGERY     Submucous resection age 75   NUCLEAR STRESS TEST  06/03/2009   RADIOACTIVE SEED IMPLANT N/A 07/28/2019   Procedure: RADIOACTIVE SEED IMPLANT/BRACHYTHERAPY IMPLANT;  Surgeon: Malen Gauze, MD;  Location: [facility];  Service: Urology;  Laterality: N/A;  90 MINS   ROBOT ASSISTED LAPAROSCOPIC NEPHRECTOMY Right 08/04/2018   Procedure: XI ROBOTIC ASSISTED LAPAROSCOPIC NEPHRECTOMY;  Surgeon: Malen Gauze, MD;  Location: [facility];  Service:  Urology;  Laterality: Right;  3 HRS   ROBOT ASSISTED PYELOPLASTY Right 06/07/2018   Procedure: attempted XI ROBOTIC ASSISTED PYELOPLASTY, lysis of adhesions;  Surgeon: Malen Gauze, MD;  Location: [facility];  Service: Urology;  Laterality: Right;  3 HRS   SKIN BIOPSY     SPACE OAR INSTILLATION N/A 07/28/2019   Procedure: SPACE OAR INSTILLATION;  Surgeon: Malen Gauze, MD;  Location: [facility];  Service: Urology;  Laterality: N/A;   URETEROSCOPY WITH HOLMIUM LASER LITHOTRIPSY Right 09/24/2017   Procedure: CYSTOSCOPY, URETEROSCOPY WITH HOLMIUM LASER LITHOTRIPSY, STENT PLACEMENT;  Surgeon: Ihor Gully, MD;  Location: [facility];  Service: Urology;  Laterality: Right;    FAMILY HISTORY:  Family History  Problem Relation Age of Onset   Heart disease Mother    Hypertension Mother    Stroke Mother    Colon cancer Mother 42   Breast cancer Mother 76   Heart disease Father  pacemaker   Aortic aneurysm Father    Hypertension Father    Lung cancer Father 22   Heart disease Sister    Atrial fibrillation Sister    Obesity Sister    Sleep apnea Sister    Heart attack Brother    Other Brother        muscle disease   Arthritis Brother    Stroke Brother    Atrial fibrillation Brother    Heart attack Brother    Atrial fibrillation Brother    Diabetes Brother    Atrial fibrillation Brother    Heart attack Brother    Hypertension Brother    Hyperlipidemia Brother    Heart attack Brother    Other Brother        heart valve operation   Atrial fibrillation Brother    Heart attack Maternal Grandmother    Diabetes Maternal Grandmother    Cancer Maternal Grandfather 44       hodgin's lymphoma   Heart attack Paternal Grandmother    Anxiety disorder Paternal Grandmother    Pneumonia Paternal Grandfather    Liver cancer Nephew 2       great nephew; d. 7   Lymphoma Niece 19   Esophageal cancer Neg Hx    Rectal cancer Neg Hx    Stomach  cancer Neg Hx    Colon polyps Neg Hx    Pancreatic cancer Neg Hx     SOCIAL HISTORY:  Social History   Socioeconomic History   Marital status: Married    Spouse name: Not on file   Number of children: 3   Years of education: Not on file   Highest education level: Not on file  Occupational History   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[employer]    Employer: [employer]  Tobacco Use   Smoking status: Former    Current packs/day: 0.00    Average packs/day: 1 pack/day for 6.0 years (6.0 ttl pk-yrs)    Types: Cigarettes    Start date: 01/11/1969    Quit date: 01/12/1969    Years since quitting: 53.5   Smokeless tobacco: Never   Tobacco comments:    1965-1971  Vaping Use   Vaping status: Never Used  Substance and Sexual Activity   Alcohol use: Not Currently    Comment: 2 whiskey drinks per day   Drug use: Never   Sexual activity: Yes  Other Topics Concern   Not on file  Social History Narrative   The patient is married for the second time, he has 3 sons.   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   2 alcoholic beverages most days.   1 caffeinated beverage daily   No drug use no current tobacco use he is a prior smoker   12/24/2016   Social Determinants of Health   Financial Resource Strain: Low Risk  (02/20/2021)   Overall Financial Resource Strain (CARDIA)    Difficulty of Paying Living Expenses: Not hard at all  Food Insecurity: No Food Insecurity (02/26/2022)   Hunger Vital Sign    Worried About Running Out of Food in the Last Year: Never true    Ran Out of Food in the Last Year: Never true  Transportation Needs: No Transportation Needs (02/26/2022)   PRAPARE - Administrator, Civil Service (Medical): No    Lack of Transportation (Non-Medical): No  Physical Activity: Insufficiently Active (02/20/2021)   Exercise Vital Sign    Days of Exercise per Week: 5 days    Minutes  of Exercise per Session: 20 min  Stress: Stress Concern Present (02/20/2021)   [employer] of Occupational Health -  Occupational Stress Questionnaire    Feeling of Stress : To some extent  Social Connections: Moderately Isolated (02/20/2021)   Social Connection and Isolation Panel [NHANES]    Frequency of Communication with Friends and Family: More than three times a week    Frequency of Social Gatherings with Friends and Family: Twice a week    Attends Religious Services: Never    Database administrator or Organizations: No    Attends Banker Meetings: Never    Marital Status: Married  Catering manager Violence: Not At Risk (02/26/2022)   Humiliation, Afraid, Rape, and Kick questionnaire    Fear of Current or Ex-Partner: No    Emotionally Abused: No    Physically Abused: No    Sexually Abused: No    ALLERGIES: Cefaclor, Cephalosporins, and Penicillins  MEDICATIONS:  Current Outpatient Medications  Medication Sig Dispense Refill   allopurinol (ZYLOPRIM) 100 MG tablet Take 0.5 tablets (50 mg total) by mouth daily. 45 tablet 1   ascorbic acid (VITAMIN C) 1000 MG tablet Take by mouth.     atenolol (TENORMIN) 50 MG tablet Take 1 tablet (50 mg total) by mouth 2 (two) times daily. 180 tablet 1   atorvastatin (LIPITOR) 10 MG tablet TAKE ONE TABLET BY MOUTH EVERY MORNING 90 tablet 1   buPROPion (WELLBUTRIN XL) 150 MG 24 hr tablet Take 1 tablet (150 mg total) by mouth at bedtime. 30 tablet 3   Cholecalciferol (VITAMIN D-1000 MAX ST) 25 MCG (1000 UT) tablet Take by mouth.     clotrimazole-betamethasone (LOTRISONE) lotion Apply topically 2 (two) times daily. For 2 weeks. (Patient taking differently: Apply topically daily. For 2 weeks.) 30 mL 0   colchicine 0.6 MG tablet For gout flares, take 1 tab po once then repeat in 1 hour as needed if pain persists up to a total of 3 doses 15 tablet 0   ELIQUIS 5 MG TABS tablet TAKE 1 TABLET BY MOUTH TWICE A DAY 60 tablet 5   fluconazole (DIFLUCAN) 150 MG tablet 1 tab po monthly and prn dermatitis 10 tablet 2   FLUoxetine (PROZAC) 10 MG tablet Take 1 tablet  (10 mg total) by mouth 3 (three) times daily. 270 tablet 1   fluticasone (FLONASE) 50 MCG/ACT nasal spray Place 2 sprays into both nostrils daily. 16 g 1   ketoconazole (NIZORAL) 2 % cream Apply 1 Application topically daily. 60 g 2   LORazepam (ATIVAN) 1 MG tablet TAKE ONE TABLET BY MOUTH EVERY 8 HOURS AS NEEDED FOR ANXIETY 90 tablet 1   meclizine (ANTIVERT) 25 MG tablet Take 1 tablet (25 mg total) by mouth 3 (three) times daily as needed for up to 30 doses for dizziness. 30 tablet 0   omeprazole (PRILOSEC) 20 MG capsule Take 20 mg by mouth daily as needed (acid reflux).      ondansetron (ZOFRAN) 4 MG tablet Take 1 tablet (4 mg total) by mouth every 6 (six) hours. 12 tablet 0   tiZANidine (ZANAFLEX) 2 MG tablet Take 0.5-2 tablets (1-4 mg total) by mouth every 8 (eight) hours as needed for muscle spasms (joint pain). 40 tablet 1   triamcinolone (KENALOG) 0.025 % cream SMARTSIG:1 Topical Daily     No current facility-administered medications for this encounter.    REVIEW OF SYSTEMS:  On review of systems, the patient reports that he is doing well overall.  He denies any chest pain, shortness of breath, cough, fevers, chills, night sweats, unintended weight changes. He denies any bowel or bladder disturbances, and denies abdominal pain, nausea or vomiting. He denies any new musculoskeletal or joint aches or pains. *** A complete review of systems is obtained and is otherwise negative.    PHYSICAL EXAM:  Wt Readings from Last 3 Encounters:  07/09/22 250 lb 6.4 oz (113.6 kg)  06/29/22 250 lb (113.4 kg)  03/10/22 251 lb (113.9 kg)   Temp Readings from Last 3 Encounters:  07/09/22 98 F (36.7 C) (Oral)  06/29/22 98.2 F (36.8 C)  03/10/22 (!) 97.5 F (36.4 C) (Oral)   BP Readings from Last 3 Encounters:  07/09/22 128/74  06/29/22 114/66  03/10/22 132/78   Pulse Readings from Last 3 Encounters:  07/09/22 73  06/29/22 (!) 56  03/10/22 (!) 58    /10  In general this is a well  appearing *** man in no acute distress. He's alert and oriented x4 and appropriate throughout the examination. Cardiopulmonary assessment is negative for acute distress and he exhibits normal effort.     KPS = ***  100 - Normal; no complaints; no evidence of disease. 90   - Able to carry on normal activity; minor signs or symptoms of disease. 80   - Normal activity with effort; some signs or symptoms of disease. 21   - Cares for self; unable to carry on normal activity or to do active work. 60   - Requires occasional assistance, but is able to care for most of his personal needs. 50   - Requires considerable assistance and frequent medical care. 40   - Disabled; requires special care and assistance. 30   - Severely disabled; hospital admission is indicated although death not imminent. 20   - Very sick; hospital admission necessary; active supportive treatment necessary. 10   - Moribund; fatal processes progressing rapidly. 0     - Dead  Karnofsky DA, Abelmann WH, Craver LS and Burchenal [facility] [phone]) The use of the nitrogen mustards in the palliative treatment of carcinoma: with particular reference to bronchogenic carcinoma Cancer 1 634-56  LABORATORY DATA:  Lab Results  Component Value Date   WBC 6.6 07/23/2022   HGB 13.8 07/23/2022   HCT 41.8 07/23/2022   MCV 96.4 07/23/2022   PLT 219.0 07/23/2022   Lab Results  Component Value Date   NA 138 07/23/2022   K 3.9 07/23/2022   CL 104 07/23/2022   CO2 26 07/23/2022   Lab Results  Component Value Date   ALT 26 07/23/2022   AST 24 07/23/2022   ALKPHOS 76 07/23/2022   BILITOT 0.7 07/23/2022     RADIOGRAPHY: No results found.    IMPRESSION/PLAN: 1. 77 y.o. man with recurrent prostate cancer, Gleason 4+4, s/p brachytherapy 07/2019***  ***   At the end of our conversation, the patient ***   I personally spent *** minutes in this encounter including chart review, reviewing radiological studies, meeting face-to-face with the  patient, entering orders and completing documentation.     Joyice Faster, PA-C    Margaretmary Dys, MD  [facility]  Radiation Oncology Direct Dial: [phone]  Fax: [phone] [email or web address]  Skype  [email or web address]   This document serves as a record of services personally performed by Margaretmary Dys, MD and Joyice Faster, PA-C. It was created on their behalf by Mickie Bail, a trained medical scribe. The creation of this record is based on the scribe's personal observations  and the provider's statements to them. This document has been checked and approved by the attending provider.

## 2022-08-03 ENCOUNTER — Ambulatory Visit
Admission: RE | Admit: 2022-08-03 | Payer: Medicare Other | Source: Ambulatory Visit | Attending: Radiation Oncology | Admitting: Radiation Oncology

## 2022-08-03 ENCOUNTER — Ambulatory Visit: Payer: Medicare Other | Admitting: Radiation Oncology

## 2022-08-03 ENCOUNTER — Ambulatory Visit
Admission: RE | Admit: 2022-08-03 | Discharge: 2022-08-03 | Disposition: A | Discharge status: Home / Self Care | Payer: Medicare Other | Source: Ambulatory Visit | Attending: Radiation Oncology | Admitting: Radiation Oncology

## 2022-08-03 ENCOUNTER — Ambulatory Visit: Payer: Medicare Other

## 2022-08-03 ENCOUNTER — Other Ambulatory Visit: Payer: Self-pay

## 2022-08-03 VITALS — BP 155/84 | HR 47 | Temp 97.1°F | Resp 18 | Ht 71.0 in | Wt 250.1 lb

## 2022-08-03 DIAGNOSIS — K760 Fatty (change of) liver, not elsewhere classified: Secondary | ICD-10-CM | POA: Insufficient documentation

## 2022-08-03 DIAGNOSIS — M109 Gout, unspecified: Secondary | ICD-10-CM | POA: Insufficient documentation

## 2022-08-03 DIAGNOSIS — Z923 Personal history of irradiation: Secondary | ICD-10-CM | POA: Diagnosis not present

## 2022-08-03 DIAGNOSIS — Z800 Family history of malignant neoplasm of digestive organs: Secondary | ICD-10-CM | POA: Diagnosis not present

## 2022-08-03 DIAGNOSIS — Z79899 Other long term (current) drug therapy: Secondary | ICD-10-CM | POA: Insufficient documentation

## 2022-08-03 DIAGNOSIS — Z7901 Long term (current) use of anticoagulants: Secondary | ICD-10-CM | POA: Insufficient documentation

## 2022-08-03 DIAGNOSIS — M170 Bilateral primary osteoarthritis of knee: Secondary | ICD-10-CM | POA: Diagnosis not present

## 2022-08-03 DIAGNOSIS — Z87442 Personal history of urinary calculi: Secondary | ICD-10-CM | POA: Insufficient documentation

## 2022-08-03 DIAGNOSIS — M419 Scoliosis, unspecified: Secondary | ICD-10-CM | POA: Diagnosis not present

## 2022-08-03 DIAGNOSIS — Z85828 Personal history of other malignant neoplasm of skin: Secondary | ICD-10-CM | POA: Insufficient documentation

## 2022-08-03 DIAGNOSIS — K219 Gastro-esophageal reflux disease without esophagitis: Secondary | ICD-10-CM | POA: Diagnosis not present

## 2022-08-03 DIAGNOSIS — E559 Vitamin D deficiency, unspecified: Secondary | ICD-10-CM | POA: Insufficient documentation

## 2022-08-03 DIAGNOSIS — M545 Low back pain, unspecified: Secondary | ICD-10-CM | POA: Diagnosis not present

## 2022-08-03 DIAGNOSIS — I10 Essential (primary) hypertension: Secondary | ICD-10-CM | POA: Insufficient documentation

## 2022-08-03 DIAGNOSIS — C61 Malignant neoplasm of prostate: Secondary | ICD-10-CM | POA: Diagnosis not present

## 2022-08-03 DIAGNOSIS — Z86010 Personal history of colonic polyps: Secondary | ICD-10-CM | POA: Insufficient documentation

## 2022-08-03 DIAGNOSIS — E785 Hyperlipidemia, unspecified: Secondary | ICD-10-CM | POA: Diagnosis not present

## 2022-08-03 DIAGNOSIS — Z87891 Personal history of nicotine dependence: Secondary | ICD-10-CM | POA: Insufficient documentation

## 2022-08-03 DIAGNOSIS — Z803 Family history of malignant neoplasm of breast: Secondary | ICD-10-CM | POA: Diagnosis not present

## 2022-08-06 ENCOUNTER — Telehealth: Payer: Self-pay | Admitting: Radiation Oncology

## 2022-08-06 ENCOUNTER — Ambulatory Visit: Payer: Medicare Other

## 2022-08-06 ENCOUNTER — Ambulatory Visit: Payer: Medicare Other | Admitting: Urology

## 2022-08-06 NOTE — Telephone Encounter (Signed)
7/25 @ 8:25 am received message from call center stated patient called to confirm appt for today.  Patient was not scheduled for any appt for today only for 8/22 for CT Sim.  Called patient back left voicemail so they are aware.

## 2022-08-13 ENCOUNTER — Emergency Department (HOSPITAL_BASED_OUTPATIENT_CLINIC_OR_DEPARTMENT_OTHER)
Admission: EM | Admit: 2022-08-13 | Discharge: 2022-08-13 | Disposition: A | Discharge status: Home / Self Care | Payer: Medicare Other | Attending: Emergency Medicine | Admitting: Emergency Medicine

## 2022-08-13 ENCOUNTER — Emergency Department (HOSPITAL_BASED_OUTPATIENT_CLINIC_OR_DEPARTMENT_OTHER): Payer: Medicare Other

## 2022-08-13 ENCOUNTER — Other Ambulatory Visit: Payer: Self-pay

## 2022-08-13 DIAGNOSIS — Z79899 Other long term (current) drug therapy: Secondary | ICD-10-CM | POA: Diagnosis not present

## 2022-08-13 DIAGNOSIS — S9031XA Contusion of right foot, initial encounter: Secondary | ICD-10-CM | POA: Insufficient documentation

## 2022-08-13 DIAGNOSIS — I129 Hypertensive chronic kidney disease with stage 1 through stage 4 chronic kidney disease, or unspecified chronic kidney disease: Secondary | ICD-10-CM | POA: Diagnosis not present

## 2022-08-13 DIAGNOSIS — I4891 Unspecified atrial fibrillation: Secondary | ICD-10-CM | POA: Diagnosis not present

## 2022-08-13 DIAGNOSIS — S99921A Unspecified injury of right foot, initial encounter: Secondary | ICD-10-CM | POA: Diagnosis not present

## 2022-08-13 DIAGNOSIS — Z7901 Long term (current) use of anticoagulants: Secondary | ICD-10-CM | POA: Insufficient documentation

## 2022-08-13 DIAGNOSIS — Z8546 Personal history of malignant neoplasm of prostate: Secondary | ICD-10-CM | POA: Insufficient documentation

## 2022-08-13 DIAGNOSIS — Z85828 Personal history of other malignant neoplasm of skin: Secondary | ICD-10-CM | POA: Diagnosis not present

## 2022-08-13 DIAGNOSIS — X58XXXA Exposure to other specified factors, initial encounter: Secondary | ICD-10-CM | POA: Insufficient documentation

## 2022-08-13 DIAGNOSIS — Z87891 Personal history of nicotine dependence: Secondary | ICD-10-CM | POA: Insufficient documentation

## 2022-08-13 DIAGNOSIS — N1831 Chronic kidney disease, stage 3a: Secondary | ICD-10-CM | POA: Insufficient documentation

## 2022-08-13 DIAGNOSIS — M19071 Primary osteoarthritis, right ankle and foot: Secondary | ICD-10-CM | POA: Diagnosis not present

## 2022-08-13 MED ORDER — LIDOCAINE 5 % EX PTCH: 1.0000 | MEDICATED_PATCH | Freq: Every day | CUTANEOUS | 0 refills | Status: DC | PRN

## 2022-08-13 MED ORDER — ACETAMINOPHEN 325 MG PO TABS: 650.0000 mg | ORAL_TABLET | Freq: Four times a day (QID) | ORAL | 0 refills | Status: AC | PRN

## 2022-08-13 NOTE — ED Triage Notes (Signed)
Patient presents to ED via POV from home. Here with right foot pain since Saturday. Bruising noted to top of foot.

## 2022-08-13 NOTE — ED Notes (Signed)
Discharge instructions discussed with pt. Pt verbalized understanding. Pt stable and ambulatory.  °

## 2022-08-13 NOTE — Discharge Instructions (Addendum)
It was a pleasure caring for you today in the emergency department. ° °Please return to the emergency department for any worsening or worrisome symptoms. ° ° °

## 2022-08-13 NOTE — ED Provider Notes (Signed)
[facility] Provider Note  CSN: [id number] Arrival date & time: 08/13/22 1612  Chief Complaint(s) Foot Pain  HPI David [name] is a 77 y.o. male with past medical history as below, significant for HTN, HLD, afib, CKD, gout who presents to the ED with complaint of foot pain. Pt reports Saturday was pushing wife in wheelchair and the chair hit a hole in the sidewalk and threw his wife out of the chair and he subsequentially kicked the back of the wheelchair. He has been having pain to his right foot since then with some bruising. He has hx gout but feels the pain in different from his typical gout pain. He is on allopurinol per pt. No medications PTA for his discomfort. Did re-injure his foot earlier today kicking a box of documents. No numbness / tingling/ no falls or head injuries, no cp or dib.   Past Medical History Past Medical History:  Diagnosis Date   Adenoma of right adrenal gland 07/11/2002   2.4cm , noted on CT ABD   Anxiety    Arthritis of both knees 03/26/2016   Astigmatism    Back pain    BCC (basal cell carcinoma of skin)    under right eye and right ear   Blood transfusion without reported diagnosis    Cataract 09/29/2016   Depression    Diverticulitis 2009   Diverticulosis    Family history of breast cancer    Family history of colon cancer    Fatty liver    GERD (gastroesophageal reflux disease)    Gout    Hearing loss of both ears 03/26/2016   History of atrial fibrillation    x 2 none since 2020   History of chronic prostatitis    started at age 42   History of kidney stones    Hx of adenomatous colonic polyps 02/16/2022   3 diminutive - no recall   Hyperglycemia    Hyperlipidemia    Hypertension    Incomplete right bundle branch block (RBBB)    Internal hemorrhoids    Kidney lesion 06/07/2015   Right midportion, 1.1x1.1 cm hyperechoic, noted on [place] ABD   LAFB (left anterior fascicular block)    Lipoma of  axilla 09/2016   Liver lesion, right lobe 06/07/2015   2.5x2.4x2.4 cm hypoechoic lesion posterior aspect, noted on [place] ABD   Low back pain 03/26/2016   LVH (left ventricular hypertrophy) 08/06/2017   Moderate, Noted on ECHO   Medicare annual wellness visit, subsequent 03/12/2014   OA (osteoarthritis)    Back, Hands, Neck   Obesity 11/25/2007   Qualifier: Diagnosis of  By: Kriste Basque MD, Lonzo Cloud    Other malaise and fatigue 03/13/2013   Premature ventricular complex    Prostate cancer (HCC) dx 2021   Renal insufficiency    Kidney removed right   Scoliosis    Upper thoracic and lumbar   Sinus bradycardia 08/06/2017   Noted on EKG   Vitamin D deficiency    resolved   Patient Active Problem List   Diagnosis Date Noted   Memory change 07/12/2022   Chronic right ear pain 03/11/2022   Chronic anticoagulation 02/11/2022   CRI (chronic renal insufficiency) 10/29/2021   Abdominal pain 10/29/2021   Sun-damaged skin 07/23/2021   H/O prostate cancer 07/23/2021   Left knee pain 07/23/2021   Coronary atherosclerosis 02/11/2021   Myalgia 11/04/2020   Neck pain 11/04/2020   Stage 3a chronic kidney disease (HCC)  07/02/2020   Urinary frequency 02/12/2020   Colon cancer screening 09/26/2019   Family history of breast cancer    Family history of colon cancer    Educated about COVID-19 virus infection 08/26/2019   Malignant neoplasm of prostate (HCC) 04/28/2019   Pruritus 02/03/2019   LVH (left ventricular hypertrophy) 10/30/2018   Tinea versicolor 09/28/2018   Thoracic back pain 09/26/2018   Hypercalcemia 08/08/2018   Nonfunctioning kidney 08/04/2018   Arm pain 07/18/2018   Right hip pain 06/22/2018   Ureteral obstruction 06/07/2018   Recurrent nephrolithiasis 04/23/2018   Hydronephrosis 03/01/2018   Floppy eyelid syndrome of both eyes 01/24/2018   Foot pain, bilateral 12/28/2017   Kidney stone 09/18/2017   Insomnia 09/17/2017   Abnormal TSH 04/15/2017   Senile nuclear sclerosis  01/18/2017   Cataract 09/29/2016   Muscle cramp 09/29/2016   Nocturia 09/29/2016   Hyperglycemia 03/29/2016   Low back pain 03/26/2016   Cataracts, bilateral 03/26/2016   Arthritis of both knees 03/26/2016   Hearing loss 03/26/2016   Complex renal cyst 06/16/2015   History of atrial fibrillation 06/16/2015   Snoring 06/07/2015   Somnolence 06/07/2015   Preventative health care 03/07/2015   Lipoma of axilla 06/04/2014   Tinea cruris 03/20/2014   Lichen simplex chronicus 03/20/2014   Medicare annual wellness visit, subsequent 03/12/2014   Depression with anxiety 09/25/2013   Constipation 09/25/2013   Gouty arthritis of toe of left foot 08/01/2013   Skin lesion 05/27/2013   Rash 05/26/2013   Abnormal liver function 03/18/2013   IBS (irritable bowel syndrome) 03/18/2013   Other malaise and fatigue 03/13/2013   Tinea corporis 03/13/2013   Gout 12/14/2012   Diarrhea 12/14/2012   Rectal irritation 09/18/2012   Diverticulosis    BCC (basal cell carcinoma of skin)    Scoliosis 01/19/2011   Hyperlipidemia, mixed 03/11/2010   Essential hypertension, benign 03/11/2010   ALLERGIC RHINITIS 03/11/2010   Arthralgia 03/11/2010   Obesity 11/25/2007   BENIGN NEOPLASM OF ADRENAL GLAND 09/22/2007   Fatty liver disease, nonalcoholic 09/22/2007   Osteoarthritis 09/22/2007   SPONDYLOSIS, LUMBAR 09/22/2007   GERD 04/12/2007   Home Medication(s) Prior to Admission medications   Medication Sig Start Date End Date Taking? Authorizing Provider  allopurinol (ZYLOPRIM) 100 MG tablet Take 0.5 tablets (50 mg total) by mouth daily. 06/03/22   [name], MD  ascorbic acid (VITAMIN C) 1000 MG tablet Take by mouth.    [provider]  atenolol (TENORMIN) 50 MG tablet Take 1 tablet (50 mg total) by mouth 2 (two) times daily. 07/17/22   [name], MD  atorvastatin (LIPITOR) 10 MG tablet TAKE ONE TABLET BY MOUTH EVERY MORNING 03/25/22   [name], MD  Cholecalciferol (VITAMIN D-1000  MAX ST) 25 MCG (1000 UT) tablet Take by mouth.    [provider]  clotrimazole-betamethasone (LOTRISONE) lotion Apply topically 2 (two) times daily. For 2 weeks. Patient taking differently: Apply topically daily. For 2 weeks. 08/19/21   [name], MD  colchicine 0.6 MG tablet For gout flares, take 1 tab po once then repeat in 1 hour as needed if pain persists up to a total of 3 doses 05/20/20   [name], MD  ELIQUIS 5 MG TABS tablet TAKE 1 TABLET BY MOUTH TWICE A DAY 04/13/22   [name], MD  fluconazole (DIFLUCAN) 150 MG tablet 1 tab po monthly and prn dermatitis 03/10/22   [name], MD  FLUoxetine (PROZAC) 10 MG tablet  Take 1 tablet (10 mg total) by mouth 3 (three) times daily. 03/30/22   [name], MD  fluticasone (FLONASE) 50 MCG/ACT nasal spray Place 2 sprays into both nostrils daily. 06/29/22   Saguier, Ramon Dredge, PA-C  ketoconazole (NIZORAL) 2 % cream Apply 1 Application topically daily. 03/10/22   [name], MD  LORazepam (ATIVAN) 1 MG tablet TAKE ONE TABLET BY MOUTH EVERY 8 HOURS AS NEEDED FOR ANXIETY 06/03/22   [name], MD  meclizine (ANTIVERT) 25 MG tablet Take 1 tablet (25 mg total) by mouth 3 (three) times daily as needed for up to 30 doses for dizziness. Patient not taking: Reported on 08/03/2022 10/15/21   Virgina Norfolk, DO  omeprazole (PRILOSEC) 20 MG capsule Take 20 mg by mouth daily as needed (acid reflux).     [provider]  ondansetron (ZOFRAN) 4 MG tablet Take 1 tablet (4 mg total) by mouth every 6 (six) hours. Patient not taking: Reported on 08/03/2022 10/15/21   Virgina Norfolk, DO  tiZANidine (ZANAFLEX) 2 MG tablet Take 0.5-2 tablets (1-4 mg total) by mouth every 8 (eight) hours as needed for muscle spasms (joint pain). 07/09/22   [name], MD  triamcinolone (KENALOG) 0.025 % cream SMARTSIG:1 Topical Daily 12/24/21   [provider]                                                                                                                                     Past Surgical History Past Surgical History:  Procedure Laterality Date   CARDIOVERSION  07/31/2017   CATARACT EXTRACTION, BILATERAL     COLON SURGERY  2009   segmental sigmoid resection   COLONOSCOPY     COLONOSCOPY WITH PROPOFOL     CYSTOSCOPY N/A 07/28/2019   Procedure: CYSTOSCOPY FLEXIBLE;  Surgeon: Malen Gauze, MD;  Location: [facility];  Service: Urology;  Laterality: N/A;   CYSTOSCOPY W/ RETROGRADES Right 06/07/2018   Procedure: CYSTOSCOPY WITH RETROGRADE PYELOGRAM, uphrostogram;  Surgeon: Malen Gauze, MD;  Location: [facility];  Service: Urology;  Laterality: Right;   CYSTOSCOPY WITH URETEROSCOPY AND STENT PLACEMENT Right 02/28/2018   Procedure: CYSTOSCOPY WITH URETEROSCOPY Iverson Alamin;  Surgeon: Ihor Gully, MD;  Location: [facility];  Service: Urology;  Laterality: Right;   CYSTOSCOPY/URETEROSCOPY/HOLMIUM LASER/STENT PLACEMENT Right 11/29/2017   Procedure: CYSTOSCOPY/RETROGRADE/URETEROSCOPY/HOLMIUM LASER/STENT PLACEMENT;  Surgeon: Ihor Gully, MD;  Location: [facility];  Service: Urology;  Laterality: Right;   EYE SURGERY Bilateral 01/12/2017   cataract removal   history of blood tranfusion  age 24   HOLMIUM LASER APPLICATION Right 09/24/2017   Procedure: HOLMIUM LASER APPLICATION;  Surgeon: Ihor Gully, MD;  Location: [facility];  Service: Urology;  Laterality: Right;   IR BALLOON DILATION URETERAL STRICTURE RIGHT  03/14/2018   IR NEPHRO TUBE REMOV/FL  03/17/2018   IR NEPHROSTOGRAM RIGHT THRU EXISTING ACCESS  03/17/2018   IR NEPHROSTOMY  EXCHANGE RIGHT  03/14/2018   IR NEPHROSTOMY EXCHANGE RIGHT  06/21/2018   IR NEPHROSTOMY PLACEMENT RIGHT  03/02/2018   IR NEPHROSTOMY PLACEMENT RIGHT  04/20/2018   IR URETERAL STENT PLACEMENT EXISTING ACCESS RIGHT  03/14/2018   NOSE SURGERY     Submucous resection age 16   NUCLEAR STRESS TEST  06/03/2009   RADIOACTIVE SEED IMPLANT  N/A 07/28/2019   Procedure: RADIOACTIVE SEED IMPLANT/BRACHYTHERAPY IMPLANT;  Surgeon: Malen Gauze, MD;  Location: [facility];  Service: Urology;  Laterality: N/A;  90 MINS   ROBOT ASSISTED LAPAROSCOPIC NEPHRECTOMY Right 08/04/2018   Procedure: XI ROBOTIC ASSISTED LAPAROSCOPIC NEPHRECTOMY;  Surgeon: Malen Gauze, MD;  Location: [facility];  Service: Urology;  Laterality: Right;  3 HRS   ROBOT ASSISTED PYELOPLASTY Right 06/07/2018   Procedure: attempted XI ROBOTIC ASSISTED PYELOPLASTY, lysis of adhesions;  Surgeon: Malen Gauze, MD;  Location: [facility];  Service: Urology;  Laterality: Right;  3 HRS   SKIN BIOPSY     SPACE OAR INSTILLATION N/A 07/28/2019   Procedure: SPACE OAR INSTILLATION;  Surgeon: Malen Gauze, MD;  Location: [facility];  Service: Urology;  Laterality: N/A;   URETEROSCOPY WITH HOLMIUM LASER LITHOTRIPSY Right 09/24/2017   Procedure: CYSTOSCOPY, URETEROSCOPY WITH HOLMIUM LASER LITHOTRIPSY, STENT PLACEMENT;  Surgeon: Ihor Gully, MD;  Location: [facility];  Service: Urology;  Laterality: Right;   Family History Family History  Problem Relation Age of Onset   Heart disease Mother    Hypertension Mother    Stroke Mother    Colon cancer Mother 61   Breast cancer Mother 13   Heart disease Father        pacemaker   Aortic aneurysm Father    Hypertension Father    Lung cancer Father 83   Heart disease Sister    Atrial fibrillation Sister    Obesity Sister    Sleep apnea Sister    Heart attack Brother    Other Brother        muscle disease   Arthritis Brother    Stroke Brother    Atrial fibrillation Brother    Heart attack Brother    Atrial fibrillation Brother    Diabetes Brother    Atrial fibrillation Brother    Heart attack Brother    Hypertension Brother    Hyperlipidemia Brother    Heart attack Brother    Other Brother        heart valve operation   Atrial fibrillation Brother    Heart  attack Maternal Grandmother    Diabetes Maternal Grandmother    Cancer Maternal Grandfather 44       hodgin's lymphoma   Heart attack Paternal Grandmother    Anxiety disorder Paternal Grandmother    Pneumonia Paternal Grandfather    Liver cancer Nephew 2       great nephew; d. 7   Lymphoma Niece 66   Esophageal cancer Neg Hx    Rectal cancer Neg Hx    Stomach cancer Neg Hx    Colon polyps Neg Hx    Pancreatic cancer Neg Hx     Social History Social History   Tobacco Use   Smoking status: Former    Current packs/day: 0.00    Average packs/day: 1 pack/day for 6.0 years (6.0 ttl pk-yrs)    Types: Cigarettes    Start date: 01/11/1969    Quit date: 01/12/1969    Years since quitting: 53.6   Smokeless tobacco:  Never   Tobacco comments:    [id number]  Vaping Use   Vaping status: Never Used  Substance Use Topics   Alcohol use: Not Currently    Comment: 2 whiskey drinks per day   Drug use: Never   Allergies Cefaclor, Cephalosporins, and Penicillins  Review of Systems Review of Systems  Constitutional:  Negative for chills and fever.  Respiratory:  Negative for chest tightness and shortness of breath.   Cardiovascular:  Negative for chest pain and palpitations.  Gastrointestinal:  Negative for abdominal pain, nausea and vomiting.  Genitourinary:  Negative for difficulty urinating.  Musculoskeletal:  Positive for arthralgias. Negative for back pain and gait problem.  Skin:  Positive for color change.  All other systems reviewed and are negative.   Physical Exam Vital Signs  I have reviewed the triage vital signs BP (!) 170/90   Pulse (!) 55   Temp 97.7 F (36.5 C) (Oral)   Resp 17   SpO2 97%  Physical Exam Vitals and nursing note reviewed.  Constitutional:      General: He is not in acute distress.    Appearance: Normal appearance. He is well-developed. He is not ill-appearing.  HENT:     Head: Normocephalic and atraumatic.     Right Ear: External ear normal.      Left Ear: External ear normal.     Nose: Nose normal.     Mouth/Throat:     Mouth: Mucous membranes are moist.  Eyes:     General: No scleral icterus.       Right eye: No discharge.        Left eye: No discharge.  Cardiovascular:     Rate and Rhythm: Normal rate.  Pulmonary:     Effort: Pulmonary effort is normal. No respiratory distress.     Breath sounds: Normal breath sounds. No stridor.  Abdominal:     General: Abdomen is flat. There is no distension.     Palpations: Abdomen is soft.     Tenderness: There is no guarding.  Musculoskeletal:        General: No deformity.     Cervical back: No rigidity.       Legs:     Comments: Achilles intact b/l Strength 5/5 BLLE  RLE NVI DP/PT intact RLE No fibular head ttp R, no knee pain R No sensation deficits RLE  Skin:    General: Skin is warm and dry.     Coloration: Skin is not cyanotic, jaundiced or pale.  Neurological:     Mental Status: He is alert and oriented to person, place, and time.     GCS: GCS eye subscore is 4. GCS verbal subscore is 5. GCS motor subscore is 6.  Psychiatric:        Speech: Speech normal.        Behavior: Behavior normal. Behavior is cooperative.     ED Results and Treatments Labs (all labs ordered are listed, but only abnormal results are displayed) Labs Reviewed - No data to display  Radiology DG Foot Complete Right  Result Date: 08/13/2022 CLINICAL DATA:  injury EXAM: RIGHT FOOT COMPLETE - 3+ VIEW COMPARISON:  None Available. FINDINGS: No acute fracture or dislocation. Mild degenerative changes of the IP joint. Possible erosion of the head of the first metatarsal. No unexpected radiopaque foreign body. Faint globular calcific density along the medial aspect of the first MTP. IMPRESSION: 1. No acute fracture or dislocation. 2. Possible erosion of the head of the first  metatarsal. Recommend correlation with any history of gout. 3. Faint globular calcific density along the medial aspect of the first MTP. This is nonspecific but could reflect gouty tophus. Electronically Signed   By: Meda Klinefelter M.D.   On: 08/13/2022 16:55    Pertinent labs & imaging results that were available during my care of the patient were reviewed by me and considered in my medical decision making (see MDM for details).  Medications Ordered in ED Medications - No data to display                                                                                                                                   Procedures Procedures  (including critical care time)  Medical Decision Making / ED Course    Medical Decision Making:    BRIGG MACKLER is a 77 y.o. male with past medical history as below, significant for HTN, HLD, afib, CKD, gout who presents to the ED with complaint of foot pain. . The complaint involves an extensive differential diagnosis and also carries with it a high risk of complications and morbidity.  Serious etiology was considered. Ddx includes but is not limited to: strain, sprain, gout, fx, hematoma, etc  Complete initial physical exam performed, notably the patient  was NAD.    Reviewed and confirmed nursing documentation for past medical history, family history, social history.  Vital signs reviewed.    Clinical Course as of 08/13/22 1821  Thu Aug 13, 2022  1758 Foot xr concerning for gout [SG]    Clinical Course User Index [SG] Sloan Leiter, DO    Pt here with recent foot injury Bruising noted on exam Slight ttp at distal forefoot  Achilles intact Gait steady XR w/o fx, he has hx gout, pt reports pain is not c/w prior gout flair No knee pain Will apply ace wrap RICE precautions for home F/u pcp Likely contusion, no fx  The patient improved significantly and was discharged in stable condition. Detailed discussions were had with the  patient regarding current findings, and need for close f/u with PCP or on call doctor. The patient has been instructed to return immediately if the symptoms worsen in any way for re-evaluation. Patient verbalized understanding and is in agreement with current care plan. All questions answered prior to discharge.     Additional history obtained: -Additional history obtained from na -External records from outside source obtained and reviewed including: Chart review  including previous notes, labs, imaging, consultation notes including primary care documentation, prior labs/imaging/home meds    Lab Tests: na  EKG   EKG Interpretation Date/Time:    Ventricular Rate:    PR Interval:    QRS Duration:    QT Interval:    QTC Calculation:   R Axis:      Text Interpretation:           Imaging Studies ordered: I ordered imaging studies including foot xr I independently visualized the following imaging with scope of interpretation limited to determining acute life threatening conditions related to emergency care; findings noted above, significant for no fx I independently visualized and interpreted imaging. I agree with the radiologist interpretation   Medicines ordered and prescription drug management: No orders of the defined types were placed in this encounter.   -I have reviewed the patients home medicines and have made adjustments as needed   Consultations Obtained: na   Cardiac Monitoring: na  Social Determinants of Health:  Diagnosis or treatment significantly limited by social determinants of health: former smoker and obesity   Reevaluation: After the interventions noted above, I reevaluated the patient and found that they have improved  Co morbidities that complicate the patient evaluation  Past Medical History:  Diagnosis Date   Adenoma of right adrenal gland 07/11/2002   2.4cm , noted on CT ABD   Anxiety    Arthritis of both knees 03/26/2016    Astigmatism    Back pain    BCC (basal cell carcinoma of skin)    under right eye and right ear   Blood transfusion without reported diagnosis    Cataract 09/29/2016   Depression    Diverticulitis 2009   Diverticulosis    Family history of breast cancer    Family history of colon cancer    Fatty liver    GERD (gastroesophageal reflux disease)    Gout    Hearing loss of both ears 03/26/2016   History of atrial fibrillation    x 2 none since 2020   History of chronic prostatitis    started at age 56   History of kidney stones    Hx of adenomatous colonic polyps 02/16/2022   3 diminutive - no recall   Hyperglycemia    Hyperlipidemia    Hypertension    Incomplete right bundle branch block (RBBB)    Internal hemorrhoids    Kidney lesion 06/07/2015   Right midportion, 1.1x1.1 cm hyperechoic, noted on [place] ABD   LAFB (left anterior fascicular block)    Lipoma of axilla 09/2016   Liver lesion, right lobe 06/07/2015   2.5x2.4x2.4 cm hypoechoic lesion posterior aspect, noted on [place] ABD   Low back pain 03/26/2016   LVH (left ventricular hypertrophy) 08/06/2017   Moderate, Noted on ECHO   Medicare annual wellness visit, subsequent 03/12/2014   OA (osteoarthritis)    Back, Hands, Neck   Obesity 11/25/2007   Qualifier: Diagnosis of  By: Kriste Basque MD, Lonzo Cloud    Other malaise and fatigue 03/13/2013   Premature ventricular complex    Prostate cancer (HCC) dx 2021   Renal insufficiency    Kidney removed right   Scoliosis    Upper thoracic and lumbar   Sinus bradycardia 08/06/2017   Noted on EKG   Vitamin D deficiency    resolved      Dispostion: Disposition decision including need for hospitalization was considered, and patient discharged from emergency department.    Final Clinical Impression(s) /  ED Diagnoses Final diagnoses:  Contusion of right foot, initial encounter        Sloan Leiter, DO 08/13/22 1821

## 2022-08-18 ENCOUNTER — Ambulatory Visit: Payer: Self-pay | Admitting: Licensed Clinical Social Worker

## 2022-08-18 NOTE — Patient Instructions (Signed)
  It was a pleasure speaking with you today. Per your request a Care Coordination phone appointment is scheduled 08/25/22  Sammuel Hines, LCSW Social Work Care Coordination  [phone]

## 2022-08-18 NOTE — Patient Outreach (Signed)
  Care Coordination  Follow Up Visit Note   08/18/2022 Name: David [name] MRN: [id number] DOB: [birth year]  David [name] is a 77 y.o. year old male who sees [name], MD for primary care. I spoke with  David [name] by phone today.  What matters to the patients health and wellness today?   Incoming call from patient to schedule f/u call with LCSW    SDOH assessments and interventions completed:  No   Care Coordination Interventions:  No, not indicated   Follow up plan: Follow up call scheduled for 08/25/22    Encounter Outcome:  Pt. Visit Completed   Sammuel Hines, LCSW Social Work Care Coordination  [facility] [employer] [phone]

## 2022-08-19 ENCOUNTER — Other Ambulatory Visit: Payer: Self-pay | Admitting: Family Medicine

## 2022-08-20 ENCOUNTER — Encounter: Payer: Self-pay | Admitting: Family Medicine

## 2022-08-24 ENCOUNTER — Telehealth: Payer: Self-pay

## 2022-08-24 NOTE — Telephone Encounter (Signed)
[name] son David [name] called to found out if Androgen deprivation therapy (ADT) injection his father received several weeks ago is possible cause for his father memory loss not significant but noticeable.  RN advised to call [facility] since that's where patient received injection maybe they can answer questions on effects of the injection.  Appreciative of information and will call [facility] to address questions.

## 2022-08-25 ENCOUNTER — Ambulatory Visit: Payer: Self-pay | Admitting: Licensed Clinical Social Worker

## 2022-08-25 ENCOUNTER — Encounter: Payer: Self-pay | Admitting: Physician Assistant

## 2022-08-25 ENCOUNTER — Ambulatory Visit: Payer: Medicare Other | Admitting: Physician Assistant

## 2022-08-25 VITALS — BP 136/74 | HR 56 | Temp 98.3°F | Resp 20 | Wt 247.0 lb

## 2022-08-25 DIAGNOSIS — H9190 Unspecified hearing loss, unspecified ear: Secondary | ICD-10-CM

## 2022-08-25 DIAGNOSIS — F418 Other specified anxiety disorders: Secondary | ICD-10-CM

## 2022-08-25 DIAGNOSIS — R413 Other amnesia: Secondary | ICD-10-CM

## 2022-08-25 LAB — POCT URINALYSIS DIP (MANUAL ENTRY)
Bilirubin, UA: NEGATIVE
Blood, UA: NEGATIVE
Glucose, UA: NEGATIVE mg/dL
Ketones, POC UA: NEGATIVE mg/dL
Nitrite, UA: NEGATIVE
Protein Ur, POC: NEGATIVE mg/dL
Spec Grav, UA: 1.015 (ref 1.010–1.025)
Urobilinogen, UA: 0.2 E.U./dL
pH, UA: 6 (ref 5.0–8.0)

## 2022-08-25 NOTE — Progress Notes (Signed)
Established patient visit   Patient: David [name]   DOB: 1945-07-02   77 y.o. Male  MRN: [id number] Visit Date: 08/25/2022  Today's healthcare provider: Alfredia Ferguson, PA-C   Cc.  Confusion, memory changes  Subjective    HPI  Patient presents today accompanied by his son.  Both are concerned over progressive memory loss that is worsened in the last 1 to 2 weeks.  Patient admits to forgetting some places names and dates, if he makes a list he might forget some items on that list.  He recalls being told to go fetch something and coming back without it.  Denies ever being lost while driving, denies ever leaving the stove or the water on.  Over the last few weeks the patient's wife's been in the hospital and his routine has been disrupted.  His son recalls an episode a few days ago where his father got locked out of his phone as he could not remember his pass code.  This was unusual is that he has had the same passcode for years and could not exactly recall the incident.  Patient also reports worsening bilateral hearing loss. Medications: Outpatient Medications Prior to Visit  Medication Sig   acetaminophen (TYLENOL) 325 MG tablet Take 2 tablets (650 mg total) by mouth every 6 (six) hours as needed.   allopurinol (ZYLOPRIM) 100 MG tablet Take 0.5 tablets (50 mg total) by mouth daily.   ascorbic acid (VITAMIN C) 1000 MG tablet Take by mouth.   atenolol (TENORMIN) 50 MG tablet Take 1 tablet (50 mg total) by mouth 2 (two) times daily.   atorvastatin (LIPITOR) 10 MG tablet TAKE ONE TABLET BY MOUTH EVERY MORNING   Cholecalciferol (VITAMIN D-1000 MAX ST) 25 MCG (1000 UT) tablet Take by mouth.   clotrimazole-betamethasone (LOTRISONE) lotion Apply topically 2 (two) times daily. For 2 weeks. (Patient taking differently: Apply topically daily. For 2 weeks.)   colchicine 0.6 MG tablet For gout flares, take 1 tab po once then repeat in 1 hour as needed if pain persists up to a total of 3  doses   ELIQUIS 5 MG TABS tablet TAKE 1 TABLET BY MOUTH TWICE A DAY   fluconazole (DIFLUCAN) 150 MG tablet 1 tab po monthly and prn dermatitis   FLUoxetine (PROZAC) 10 MG tablet Take 1 tablet (10 mg total) by mouth 3 (three) times daily.   fluticasone (FLONASE) 50 MCG/ACT nasal spray Place 2 sprays into both nostrils daily.   ketoconazole (NIZORAL) 2 % cream Apply 1 Application topically daily.   lidocaine (LIDODERM) 5 % Place 1 patch onto the skin daily as needed. Remove & Discard patch within 12 hours or as directed by MD   LORazepam (ATIVAN) 1 MG tablet TAKE ONE TABLET BY MOUTH EVERY 8 HOURS AS NEEDED FOR ANXIETY   omeprazole (PRILOSEC) 20 MG capsule Take 20 mg by mouth daily as needed (acid reflux).    tiZANidine (ZANAFLEX) 2 MG tablet Take 0.5-2 tablets (1-4 mg total) by mouth every 8 (eight) hours as needed for muscle spasms (joint pain).   triamcinolone (KENALOG) 0.025 % cream SMARTSIG:1 Topical Daily   [DISCONTINUED] meclizine (ANTIVERT) 25 MG tablet Take 1 tablet (25 mg total) by mouth 3 (three) times daily as needed for up to 30 doses for dizziness.   [DISCONTINUED] ondansetron (ZOFRAN) 4 MG tablet Take 1 tablet (4 mg total) by mouth every 6 (six) hours.   No facility-administered medications prior to visit.    Review of  Systems  Constitutional:  Negative for fatigue and fever.  Respiratory:  Negative for cough and shortness of breath.   Cardiovascular:  Negative for chest pain, palpitations and leg swelling.  Neurological:  Negative for dizziness and headaches.      Objective    BP 136/74 (BP Location: Left Arm, Patient Position: Sitting, Cuff Size: Normal)   Pulse (!) 56   Temp 98.3 F (36.8 C) (Oral)   Resp 20   Wt 247 lb (112 kg)   SpO2 97%   BMI 34.45 kg/m   Physical Exam Vitals reviewed.  Constitutional:      Appearance: He is not ill-appearing.  HENT:     Head: Normocephalic.  Eyes:     Conjunctiva/sclera: Conjunctivae normal.  Cardiovascular:     Rate  and Rhythm: Normal rate.  Pulmonary:     Effort: Pulmonary effort is normal. No respiratory distress.  Neurological:     General: No focal deficit present.     Mental Status: He is alert and oriented to person, place, and time.  Psychiatric:        Mood and Affect: Mood normal.        Behavior: Behavior normal.      Results for orders placed or performed in visit on 08/25/22  POCT urinalysis dipstick  Result Value Ref Range   Color, UA yellow yellow   Clarity, UA clear clear   Glucose, UA negative negative mg/dL   Bilirubin, UA negative negative   Ketones, POC UA negative negative mg/dL   Spec Grav, UA 1.610 9.604 - 1.025   Blood, UA negative negative   pH, UA 6.0 5.0 - 8.0   Protein Ur, POC negative negative mg/dL   Urobilinogen, UA 0.2 0.2 or 1.0 E.U./dL   Nitrite, UA Negative Negative   Leukocytes, UA Trace (A) Negative    Assessment & Plan     Problem List Items Addressed This Visit       Nervous and Auditory   Hearing loss    Encouraged patient to have a repeat evaluation        Other   Memory changes - Primary    Mini-Mental 28 out of 30.  Oriented to person place and time.  He is very possible that ongoing change in life routine and stress doing due to his wife being in the hospital is creating a temporary increase in memory alteration.  Again referred to neurology and have put his son as a contact with patient permission.  Given history of prostate cancer UA today done, negative but sent for culture.      Relevant Orders   POCT urinalysis dipstick (Completed)   Ambulatory referral to Neurology   Urine Culture     Return if symptoms worsen or fail to improve.      I, Alfredia Ferguson, PA-C have reviewed all documentation for this visit. The documentation on  08/25/22   for the exam, diagnosis, procedures, and orders are all accurate and complete.    Alfredia Ferguson, PA-C  [facility] [phone]  (phone) [phone] (fax)  [facility]

## 2022-08-25 NOTE — Patient Outreach (Signed)
  Care Coordination  Follow Up Visit Note   08/25/2022 Name: David [name] MRN: [id number] DOB: 08/01/1945  David [name] is a 77 y.o. year old male who sees [name], MD for primary care. I spoke with  David [name] by phone today.  What matters to the patients health and wellness today?    Patient was accompanied by his son David [name] who provided information during this encounter. Patient would like to move forward with connecting for therapy to manage symptoms of anxiety and depression  .   Goals Addressed             This Visit's Progress    manage stress and start therapy       Activities and task to complete in order to accomplish goals.   Keep all upcoming appointment discussed today Continue with compliance of taking medication prescribed by Doctor Complete Advance Directive packet,  Have advance directive notarized and provide a copy to provider office  I have placed a referral with [facility] [phone] they will contact you to schedule your therapy appointment. Feel free to call them if you would like.       SDOH assessments and interventions completed:  No   Care Coordination Interventions:  Yes, provided  Interventions Today    Flowsheet Row Most Recent Value  Chronic Disease   Chronic disease during today's visit Hypertension (HTN), Chronic Kidney Disease/End Stage Renal Disease (ESRD)  General Interventions   General Interventions Discussed/Reviewed General Interventions Reviewed  Education Interventions   Education Provided Provided Education  Provided Verbal Education On Mental Health/Coping with Illness  Mental Health Interventions   Mental Health Discussed/Reviewed Mental Health Reviewed, Anxiety, Depression  [caregiver stress]  Pharmacy Interventions   Pharmacy Dicussed/Reviewed Pharmacy Topics Reviewed  [continues to take prozac and ativan  reports no missed doses]  Advanced Directive Interventions   Advanced  Directives Discussed/Reviewed Advanced Directives Discussed  [per son they have an appointment to work on these documents and will provide a copy to the office]       Follow up plan: Follow up call scheduled for 09/15/22    Encounter Outcome:  Pt. Visit Completed   Sammuel Hines, LCSW Social Work Care Coordination  [facility] [employer] [phone]

## 2022-08-25 NOTE — Assessment & Plan Note (Signed)
Encouraged patient to have a repeat evaluation

## 2022-08-25 NOTE — Patient Instructions (Signed)
Social Work Visit Information  Thank you for taking time to visit with me today. Please don't hesitate to contact me if I can be of assistance to you.   Following are the goals we discussed today:   Goals Addressed             This Visit's Progress    manage stress and start therapy       Activities and task to complete in order to accomplish goals.   Keep all upcoming appointment discussed today Continue with compliance of taking medication prescribed by Doctor Complete Advance Directive packet,  Have advance directive notarized and provide a copy to provider office  I have placed a referral with [facility] [phone] they will contact you to schedule your therapy appointment. Feel free to call them if you would like.        Our next appointment is by telephone on 09/15/22 at 2:45   Please call the care guide team at [phone] if you need to cancel or reschedule your appointment.   If you or anyone you know are experiencing a Mental Health or Behavioral Health Crisis or need someone to talk to, please call the Suicide and Crisis Lifeline: 988 call the [place] National Suicide Prevention Lifeline: (256) 364-1524 or TTY: 209 396 9803 TTY (504)138-9101) to talk to a trained counselor call 1-800-273-TALK (toll free, 24 hour hotline) go to [facility] [address] [place] [phone])   Patient verbalizes understanding of instructions and care plan provided today and agrees to view in MyChart. Active MyChart status and patient understanding of how to access instructions and care plan via MyChart confirmed with patient.       Sammuel Hines, LCSW Social Work Care Coordination  [facility] [employer] [employer] [phone]

## 2022-08-25 NOTE — Assessment & Plan Note (Signed)
Mini-Mental 28 out of 30.  Oriented to person place and time.  He is very possible that ongoing change in life routine and stress doing due to his wife being in the hospital is creating a temporary increase in memory alteration.  Again referred to neurology and have put his son as a contact with patient permission.  Given history of prostate cancer UA today done, negative but sent for culture.

## 2022-08-27 ENCOUNTER — Encounter (INDEPENDENT_AMBULATORY_CARE_PROVIDER_SITE_OTHER): Payer: Self-pay

## 2022-09-03 ENCOUNTER — Ambulatory Visit
Admission: RE | Admit: 2022-09-03 | Discharge: 2022-09-03 | Disposition: A | Discharge status: Home / Self Care | Payer: Medicare Other | Source: Ambulatory Visit | Attending: Radiation Oncology | Admitting: Radiation Oncology

## 2022-09-03 DIAGNOSIS — C61 Malignant neoplasm of prostate: Secondary | ICD-10-CM | POA: Insufficient documentation

## 2022-09-03 NOTE — Progress Notes (Signed)
  Radiation Oncology         [phone] ________________________________  Name: LORON BRAZILE MRN: [id number]  Date: 09/03/2022  DOB: 07/21/1945  SIMULATION AND TREATMENT PLANNING NOTE    ICD-10-CM   1. Malignant neoplasm of prostate (HCC)  C61       DIAGNOSIS:  77 yo gentleman with a locally recurrent prostate cancer with a Gleason 4+4 and PSA of 7.1 - s/p definitive brachytherapy 07/28/19   NARRATIVE:  The patient was brought to the CT Simulation planning suite.  Identity was confirmed.  All relevant records and images related to the planned course of therapy were reviewed.  The patient freely provided informed written consent to proceed with treatment after reviewing the details related to the planned course of therapy. The consent form was witnessed and verified by the simulation staff.  Then, the patient was set-up in a stable reproducible supine position for radiation therapy.  A vacuum lock pillow device was custom fabricated to position his legs in a reproducible immobilized position.  Then, I performed a urethrogram under sterile conditions to identify the prostatic apex.  CT images were obtained.  Surface markings were placed.  The CT images were loaded into the planning software.  Then the prostate target and avoidance structures including the rectum, bladder, bowel and hips were contoured.  Treatment planning then occurred.  The radiation prescription was entered and confirmed.  A total of one complex treatment devices was fabricated. I have requested : Intensity Modulated Radiotherapy (IMRT) is medically necessary for this case for the following reason:  Rectal sparing.Marland Kitchen  SPECIAL TREATMENT PROCEDURE:  The planned course of therapy using radiation constitutes a special treatment procedure. Special care is required in the management of this patient for the following reasons. This treatment constitutes a Special Treatment Procedure for the following reason: [ Retreatment in a previously  radiated area requiring careful monitoring of increased risk of toxicity due to overlap of previous treatment..  The special nature of the planned course of radiotherapy will require increased physician supervision and oversight to ensure patient's safety with optimal treatment outcomes.  This will require extended time and effort from me.  Specifically, the patient had seed implant in 2021, and due to previous radiation, the high dose boost target will be more restricted posteriorly to limit rectal re-irradiation, although he did have SpaceOAR with seeds, which reduced rectal exposure.  PLAN:   The prostate, seminal vesicles, and pelvic lymph nodes will initially be treated to 45 Gy in 25 fractions of 1.8 Gy followed by a boost to the prostate only, to 75 Gy with 15 additional fractions of 2.0 Gy   ________________________________  Artist Pais Kathrynn Running, M.D.

## 2022-09-08 DIAGNOSIS — C61 Malignant neoplasm of prostate: Secondary | ICD-10-CM | POA: Diagnosis not present

## 2022-09-15 ENCOUNTER — Encounter: Payer: Self-pay | Admitting: Licensed Clinical Social Worker

## 2022-09-15 ENCOUNTER — Other Ambulatory Visit: Payer: Self-pay

## 2022-09-15 ENCOUNTER — Emergency Department (HOSPITAL_COMMUNITY): Payer: Medicare Other

## 2022-09-15 ENCOUNTER — Ambulatory Visit
Admission: RE | Admit: 2022-09-15 | Discharge: 2022-09-15 | Disposition: A | Discharge status: Home / Self Care | Payer: Medicare Other | Source: Ambulatory Visit | Attending: Radiation Oncology | Admitting: Radiation Oncology

## 2022-09-15 ENCOUNTER — Emergency Department (HOSPITAL_COMMUNITY)
Admission: EM | Admit: 2022-09-15 | Discharge: 2022-09-15 | Disposition: A | Discharge status: Home / Self Care | Payer: Medicare Other | Attending: Emergency Medicine | Admitting: Emergency Medicine

## 2022-09-15 DIAGNOSIS — M545 Low back pain, unspecified: Secondary | ICD-10-CM | POA: Insufficient documentation

## 2022-09-15 DIAGNOSIS — Z7901 Long term (current) use of anticoagulants: Secondary | ICD-10-CM | POA: Diagnosis not present

## 2022-09-15 DIAGNOSIS — Y92481 Parking lot as the place of occurrence of the external cause: Secondary | ICD-10-CM | POA: Insufficient documentation

## 2022-09-15 DIAGNOSIS — M5137 Other intervertebral disc degeneration, lumbosacral region: Secondary | ICD-10-CM | POA: Insufficient documentation

## 2022-09-15 DIAGNOSIS — R300 Dysuria: Secondary | ICD-10-CM | POA: Insufficient documentation

## 2022-09-15 DIAGNOSIS — I700 Atherosclerosis of aorta: Secondary | ICD-10-CM | POA: Insufficient documentation

## 2022-09-15 DIAGNOSIS — W19XXXA Unspecified fall, initial encounter: Secondary | ICD-10-CM | POA: Diagnosis not present

## 2022-09-15 DIAGNOSIS — M5126 Other intervertebral disc displacement, lumbar region: Secondary | ICD-10-CM | POA: Diagnosis not present

## 2022-09-15 DIAGNOSIS — C61 Malignant neoplasm of prostate: Secondary | ICD-10-CM | POA: Diagnosis not present

## 2022-09-15 DIAGNOSIS — M4316 Spondylolisthesis, lumbar region: Secondary | ICD-10-CM | POA: Diagnosis not present

## 2022-09-15 DIAGNOSIS — M4317 Spondylolisthesis, lumbosacral region: Secondary | ICD-10-CM | POA: Diagnosis not present

## 2022-09-15 DIAGNOSIS — Z510 Encounter for antineoplastic radiation therapy: Secondary | ICD-10-CM | POA: Diagnosis not present

## 2022-09-15 DIAGNOSIS — Z8546 Personal history of malignant neoplasm of prostate: Secondary | ICD-10-CM | POA: Insufficient documentation

## 2022-09-15 DIAGNOSIS — M549 Dorsalgia, unspecified: Secondary | ICD-10-CM | POA: Diagnosis not present

## 2022-09-15 DIAGNOSIS — M5136 Other intervertebral disc degeneration, lumbar region: Secondary | ICD-10-CM | POA: Diagnosis not present

## 2022-09-15 DIAGNOSIS — G8929 Other chronic pain: Secondary | ICD-10-CM | POA: Insufficient documentation

## 2022-09-15 LAB — RAD ONC ARIA SESSION SUMMARY
Course Elapsed Days: 0
Plan Fractions Treated to Date: 1
Plan Prescribed Dose Per Fraction: 1.8 Gy
Plan Total Fractions Prescribed: 25
Plan Total Prescribed Dose: 45 Gy
Reference Point Dosage Given to Date: 1.8 Gy
Reference Point Session Dosage Given: 1.8 Gy
Session Number: 1

## 2022-09-15 NOTE — ED Provider Notes (Signed)
[place] EMERGENCY DEPARTMENT AT [facility] Provider Note   CSN: [id number] Arrival date & time: 09/15/22  1352     History Chief Complaint  Patient presents with   Marletta Lor    David [name] is a 77 y.o. male. Patient with past history significant for prostate cancer, lumbar spondylosis, osteoarthritis presents to the ED with concerns of a fall. Reports he is currently on Eliquis. Reports that he slipped on pine needles after leaving a cancer treatment session and fell onto his hands and feet. Denies any pain in his hands or feet and no notable lacerations. Reports that lumbar spine feels a little sore, but same as baseline pain. Denies any saddles paraesthesia, loss of bowel or bladder incontinence. Denies head strike, syncope, headache, nausea, or vomiting.   Fall       Home Medications Prior to Admission medications   Medication Sig Start Date End Date Taking? Authorizing Provider  acetaminophen (TYLENOL) 325 MG tablet Take 2 tablets (650 mg total) by mouth every 6 (six) hours as needed. 08/13/22   Sloan Leiter, DO  allopurinol (ZYLOPRIM) 100 MG tablet Take 0.5 tablets (50 mg total) by mouth daily. 06/03/22   [name], MD  ascorbic acid (VITAMIN C) 1000 MG tablet Take by mouth.    [provider]  atenolol (TENORMIN) 50 MG tablet Take 1 tablet (50 mg total) by mouth 2 (two) times daily. 07/17/22   [name], MD  atorvastatin (LIPITOR) 10 MG tablet TAKE ONE TABLET BY MOUTH EVERY MORNING 03/25/22   [name], MD  Cholecalciferol (VITAMIN D-1000 MAX ST) 25 MCG (1000 UT) tablet Take by mouth.    [provider]  clotrimazole-betamethasone (LOTRISONE) lotion Apply topically 2 (two) times daily. For 2 weeks. Patient taking differently: Apply topically daily. For 2 weeks. 08/19/21   [name], MD  colchicine 0.6 MG tablet For gout flares, take 1 tab po once then repeat in 1 hour as needed if pain persists up to a total of 3 doses 05/20/20    [name], MD  ELIQUIS 5 MG TABS tablet TAKE 1 TABLET BY MOUTH TWICE A DAY 04/13/22   [name], MD  fluconazole (DIFLUCAN) 150 MG tablet 1 tab po monthly and prn dermatitis 03/10/22   [name], MD  FLUoxetine (PROZAC) 10 MG tablet Take 1 tablet (10 mg total) by mouth 3 (three) times daily. 03/30/22   [name], MD  fluticasone (FLONASE) 50 MCG/ACT nasal spray Place 2 sprays into both nostrils daily. 06/29/22   Saguier, Ramon Dredge, PA-C  ketoconazole (NIZORAL) 2 % cream Apply 1 Application topically daily. 03/10/22   [name], MD  lidocaine (LIDODERM) 5 % Place 1 patch onto the skin daily as needed. Remove & Discard patch within 12 hours or as directed by MD 08/13/22   Tanda Rockers A, DO  LORazepam (ATIVAN) 1 MG tablet TAKE ONE TABLET BY MOUTH EVERY 8 HOURS AS NEEDED FOR ANXIETY 08/19/22   [name], MD  omeprazole (PRILOSEC) 20 MG capsule Take 20 mg by mouth daily as needed (acid reflux).     [provider]  tiZANidine (ZANAFLEX) 2 MG tablet Take 0.5-2 tablets (1-4 mg total) by mouth every 8 (eight) hours as needed for muscle spasms (joint pain). 07/09/22   [name], MD  triamcinolone (KENALOG) 0.025 % cream SMARTSIG:1 Topical Daily 12/24/21   [provider]      Allergies    Cefaclor,  Cephalosporins, and Penicillins    Review of Systems   Review of Systems  Musculoskeletal:  Positive for back pain.  All other systems reviewed and are negative.   Physical Exam Updated Vital Signs BP (!) 171/104 (BP Location: Right Arm)   Pulse (!) 58   Temp 98.3 F (36.8 C) (Oral)   Resp 16   Ht 5\' 11"  (1.803 m)   Wt 112 kg   SpO2 98%   BMI 34.44 kg/m  Physical Exam Vitals and nursing note reviewed.  Constitutional:      General: He is not in acute distress.    Appearance: He is well-developed.  HENT:     Head: Normocephalic and atraumatic.  Eyes:     Conjunctiva/sclera: Conjunctivae normal.  Cardiovascular:     Rate and Rhythm: Normal  rate and regular rhythm.     Heart sounds: No murmur heard. Pulmonary:     Effort: Pulmonary effort is normal. No respiratory distress.     Breath sounds: Normal breath sounds.  Abdominal:     Palpations: Abdomen is soft.     Tenderness: There is no abdominal tenderness.  Musculoskeletal:        General: No swelling, tenderness, deformity or signs of injury. Normal range of motion.     Cervical back: Neck supple.     Comments: No paraspinal or midline tenderness in the cervical, thoracic, or lumbar spine. No visible deformity in any extremity. No focal area of pain or tenderness.  Skin:    General: Skin is warm and dry.     Capillary Refill: Capillary refill takes less than 2 seconds.  Neurological:     Mental Status: He is alert.  Psychiatric:        Mood and Affect: Mood normal.     ED Results / Procedures / Treatments   Labs (all labs ordered are listed, but only abnormal results are displayed) Labs Reviewed - No data to display  EKG None  Radiology No results found.  Procedures Procedures   Medications Ordered in ED Medications - No data to display  ED Course/ Medical Decision Making/ A&P Clinical Course as of 09/15/22 1658  Tue Sep 15, 2022  1532 Stable  77YOM with GLF. On Eliquis. Has acute on chronic low back pain.   [CC]  1626 Evaluated at bedside.  Patient stated "I have no symptoms at this time" He is requesting discharge. Midline spine without any focal pathology. He feels comfortable ambulating.  States he would like to go home and is neurologically intact. [CC]    Clinical Course User Index [CC] Glyn Ade, MD                               Medical Decision Making Amount and/or Complexity of Data Reviewed Radiology: ordered.   This patient presents to the ED for concern of fall. Differential diagnosis includes lumbar strain, cauda equina syndrome, syncope, SAH   Imaging Studies ordered:  I ordered imaging studies including xray lumbar  spine  I independently visualized and interpreted imaging which showed no acute fractures per my interpretation I agree with the radiologist interpretation   Problem List / ED Course:  Patient presented to the ED with concerns of a fall. States that he lost his footing while leaving a cancer treatment session due to pine needles covering a rock. States he fell forward and landed on hands and feet. No head strike, LOC, or lacerations.  No acute or focal area of pain. Some low back pain but history of chronic lumbar spine pain. Denies saddle paraesthesia, weakness, numbness, bowel or bladder incontinence, or any other concerns. With history of low back pain, will performed xray imaging of the low back for assessment. Patient evaluated by Dr. Doran Durand, ED attending physician, and patient stated that he was not hurting anywhere and simply wanted to get checked out quickly. Requesting discharge home. Low concern at this time given lack of severe symptoms or severe mechanism. Will discharge patient home and encouraged outpatient PCP follow up. Strict return precautions discussed. Patient agreeable with treatment plan and plan for discharge home. Low concerns at this time for traumatic injury, SAH, or cauda equina syndrome. All questions answered prior to patient discharge.  Final Clinical Impression(s) / ED Diagnoses Final diagnoses:  Fall, initial encounter  Chronic midline low back pain without sciatica    Rx / DC Orders ED Discharge Orders     None         Smitty Knudsen, PA-C 09/15/22 1658    Glyn Ade, MD 09/15/22 1954

## 2022-09-15 NOTE — ED Triage Notes (Signed)
Pt arrived POV reporting he fell in the parking lot. Denies LOC or head injury. States he slipped on pine. States lower back pain. States this happen as he was leaving radiation treatment. Reports he is on blood thinners. Denies cp, shob or dizziness.

## 2022-09-15 NOTE — Discharge Instructions (Addendum)
You were seen in the ER today for a fall. Given that you are not in any particular pain, I have low concern at this time. Your xray read is still pending but we will call you if anything is abnormal on this. Take Tylenol, ibuprofen, or Aleve for pain if needed. Follow up with your primary care provider or return to the ER if concern for worsening symptoms.

## 2022-09-16 ENCOUNTER — Other Ambulatory Visit: Payer: Self-pay

## 2022-09-16 ENCOUNTER — Ambulatory Visit
Admission: RE | Admit: 2022-09-16 | Discharge: 2022-09-16 | Disposition: A | Discharge status: Home / Self Care | Payer: Medicare Other | Source: Ambulatory Visit | Attending: Radiation Oncology | Admitting: Radiation Oncology

## 2022-09-16 DIAGNOSIS — R300 Dysuria: Secondary | ICD-10-CM | POA: Diagnosis not present

## 2022-09-16 DIAGNOSIS — N471 Phimosis: Secondary | ICD-10-CM | POA: Diagnosis not present

## 2022-09-16 DIAGNOSIS — C61 Malignant neoplasm of prostate: Secondary | ICD-10-CM | POA: Diagnosis present

## 2022-09-16 DIAGNOSIS — Z510 Encounter for antineoplastic radiation therapy: Secondary | ICD-10-CM | POA: Diagnosis not present

## 2022-09-16 LAB — RAD ONC ARIA SESSION SUMMARY
Course Elapsed Days: 1
Plan Fractions Treated to Date: 2
Plan Prescribed Dose Per Fraction: 1.8 Gy
Plan Total Fractions Prescribed: 25
Plan Total Prescribed Dose: 45 Gy
Reference Point Dosage Given to Date: 3.6 Gy
Reference Point Session Dosage Given: 1.8 Gy
Session Number: 2

## 2022-09-17 ENCOUNTER — Telehealth: Payer: Self-pay

## 2022-09-17 ENCOUNTER — Other Ambulatory Visit: Payer: Self-pay

## 2022-09-17 ENCOUNTER — Ambulatory Visit
Admission: RE | Admit: 2022-09-17 | Discharge: 2022-09-17 | Disposition: A | Discharge status: Home / Self Care | Payer: Medicare Other | Source: Ambulatory Visit | Attending: Radiation Oncology | Admitting: Radiation Oncology

## 2022-09-17 ENCOUNTER — Telehealth: Payer: Self-pay | Admitting: Radiation Oncology

## 2022-09-17 DIAGNOSIS — Z510 Encounter for antineoplastic radiation therapy: Secondary | ICD-10-CM | POA: Diagnosis not present

## 2022-09-17 DIAGNOSIS — R300 Dysuria: Secondary | ICD-10-CM | POA: Diagnosis not present

## 2022-09-17 DIAGNOSIS — C61 Malignant neoplasm of prostate: Secondary | ICD-10-CM | POA: Diagnosis not present

## 2022-09-17 LAB — RAD ONC ARIA SESSION SUMMARY
Course Elapsed Days: 2
Plan Fractions Treated to Date: 3
Plan Prescribed Dose Per Fraction: 1.8 Gy
Plan Total Fractions Prescribed: 25
Plan Total Prescribed Dose: 45 Gy
Reference Point Dosage Given to Date: 5.4 Gy
Reference Point Session Dosage Given: 1.8 Gy
Session Number: 3

## 2022-09-17 NOTE — Telephone Encounter (Signed)
Jeanclaude Quade (son) called in reference to his (father) [name] needing transportation to visits and wanted to know steps to get started.  RN told him that our transportation coordinator will handle transportation needs for patients at Cancer center. RN  told [name] will notify transportation coordinator Ephriam Knuckles of need and that someone for their office will contact him.

## 2022-09-17 NOTE — Telephone Encounter (Signed)
9/5 @ 9:01 am Patient's son [name] called to confirm, if patient gave an urine sample to be check, if so what are the results from 9/3 patient 1st treatment appointment.  Called [place], [place], then transfer called as requested.

## 2022-09-18 ENCOUNTER — Other Ambulatory Visit: Payer: Self-pay

## 2022-09-18 ENCOUNTER — Ambulatory Visit
Admission: RE | Admit: 2022-09-18 | Discharge: 2022-09-18 | Disposition: A | Discharge status: Home / Self Care | Payer: Medicare Other | Source: Ambulatory Visit | Attending: Radiation Oncology

## 2022-09-18 ENCOUNTER — Inpatient Hospital Stay: Payer: Medicare Other

## 2022-09-18 ENCOUNTER — Ambulatory Visit
Admission: RE | Admit: 2022-09-18 | Discharge: 2022-09-18 | Disposition: A | Discharge status: Home / Self Care | Payer: Medicare Other | Source: Ambulatory Visit | Attending: Radiation Oncology | Admitting: Radiation Oncology

## 2022-09-18 DIAGNOSIS — R300 Dysuria: Secondary | ICD-10-CM | POA: Diagnosis not present

## 2022-09-18 DIAGNOSIS — Z510 Encounter for antineoplastic radiation therapy: Secondary | ICD-10-CM | POA: Diagnosis not present

## 2022-09-18 DIAGNOSIS — C61 Malignant neoplasm of prostate: Secondary | ICD-10-CM | POA: Insufficient documentation

## 2022-09-18 LAB — RAD ONC ARIA SESSION SUMMARY
Course Elapsed Days: 3
Plan Fractions Treated to Date: 4
Plan Prescribed Dose Per Fraction: 1.8 Gy
Plan Total Fractions Prescribed: 25
Plan Total Prescribed Dose: 45 Gy
Reference Point Dosage Given to Date: 7.2 Gy
Reference Point Session Dosage Given: 1.8 Gy
Session Number: 4

## 2022-09-21 ENCOUNTER — Other Ambulatory Visit: Payer: Self-pay

## 2022-09-21 ENCOUNTER — Ambulatory Visit
Admission: RE | Admit: 2022-09-21 | Discharge: 2022-09-21 | Disposition: A | Discharge status: Home / Self Care | Payer: Medicare Other | Source: Ambulatory Visit | Attending: Radiation Oncology | Admitting: Radiation Oncology

## 2022-09-21 DIAGNOSIS — C61 Malignant neoplasm of prostate: Secondary | ICD-10-CM | POA: Diagnosis not present

## 2022-09-21 DIAGNOSIS — Z510 Encounter for antineoplastic radiation therapy: Secondary | ICD-10-CM | POA: Diagnosis not present

## 2022-09-21 DIAGNOSIS — R300 Dysuria: Secondary | ICD-10-CM | POA: Diagnosis not present

## 2022-09-21 LAB — RAD ONC ARIA SESSION SUMMARY
Course Elapsed Days: 6
Plan Fractions Treated to Date: 5
Plan Prescribed Dose Per Fraction: 1.8 Gy
Plan Total Fractions Prescribed: 25
Plan Total Prescribed Dose: 45 Gy
Reference Point Dosage Given to Date: 9 Gy
Reference Point Session Dosage Given: 1.8 Gy
Session Number: 5

## 2022-09-22 ENCOUNTER — Ambulatory Visit
Admission: RE | Admit: 2022-09-22 | Discharge: 2022-09-22 | Disposition: A | Discharge status: Home / Self Care | Payer: Medicare Other | Source: Ambulatory Visit | Attending: Radiation Oncology

## 2022-09-22 ENCOUNTER — Other Ambulatory Visit: Payer: Self-pay

## 2022-09-22 DIAGNOSIS — Z510 Encounter for antineoplastic radiation therapy: Secondary | ICD-10-CM | POA: Diagnosis not present

## 2022-09-22 DIAGNOSIS — R300 Dysuria: Secondary | ICD-10-CM | POA: Diagnosis not present

## 2022-09-22 DIAGNOSIS — C61 Malignant neoplasm of prostate: Secondary | ICD-10-CM | POA: Diagnosis not present

## 2022-09-22 LAB — RAD ONC ARIA SESSION SUMMARY
Course Elapsed Days: 7
Plan Fractions Treated to Date: 6
Plan Prescribed Dose Per Fraction: 1.8 Gy
Plan Total Fractions Prescribed: 25
Plan Total Prescribed Dose: 45 Gy
Reference Point Dosage Given to Date: 10.8 Gy
Reference Point Session Dosage Given: 1.8 Gy
Session Number: 6

## 2022-09-23 ENCOUNTER — Ambulatory Visit
Admission: RE | Admit: 2022-09-23 | Discharge: 2022-09-23 | Disposition: A | Discharge status: Home / Self Care | Payer: Medicare Other | Source: Ambulatory Visit | Attending: Radiation Oncology

## 2022-09-23 ENCOUNTER — Inpatient Hospital Stay: Payer: Medicare Other

## 2022-09-23 ENCOUNTER — Other Ambulatory Visit: Payer: Self-pay

## 2022-09-23 DIAGNOSIS — R300 Dysuria: Secondary | ICD-10-CM

## 2022-09-23 DIAGNOSIS — C61 Malignant neoplasm of prostate: Secondary | ICD-10-CM | POA: Diagnosis not present

## 2022-09-23 DIAGNOSIS — Z510 Encounter for antineoplastic radiation therapy: Secondary | ICD-10-CM | POA: Diagnosis not present

## 2022-09-23 LAB — RAD ONC ARIA SESSION SUMMARY
Course Elapsed Days: 8
Plan Fractions Treated to Date: 7
Plan Prescribed Dose Per Fraction: 1.8 Gy
Plan Total Fractions Prescribed: 25
Plan Total Prescribed Dose: 45 Gy
Reference Point Dosage Given to Date: 12.6 Gy
Reference Point Session Dosage Given: 1.8 Gy
Session Number: 7

## 2022-09-23 LAB — URINALYSIS, COMPLETE (UACMP) WITH MICROSCOPIC
Bacteria, UA: NONE SEEN
Bilirubin Urine: NEGATIVE
Glucose, UA: NEGATIVE mg/dL
Hgb urine dipstick: NEGATIVE
Ketones, ur: NEGATIVE mg/dL
Leukocytes,Ua: NEGATIVE
Nitrite: NEGATIVE
Protein, ur: NEGATIVE mg/dL
Specific Gravity, Urine: 1.017 (ref 1.005–1.030)
pH: 6 (ref 5.0–8.0)

## 2022-09-24 ENCOUNTER — Other Ambulatory Visit: Payer: Self-pay

## 2022-09-24 ENCOUNTER — Ambulatory Visit
Admission: RE | Admit: 2022-09-24 | Discharge: 2022-09-24 | Disposition: A | Discharge status: Home / Self Care | Payer: Medicare Other | Source: Ambulatory Visit | Attending: Radiation Oncology | Admitting: Radiation Oncology

## 2022-09-24 DIAGNOSIS — R300 Dysuria: Secondary | ICD-10-CM | POA: Diagnosis not present

## 2022-09-24 DIAGNOSIS — C61 Malignant neoplasm of prostate: Secondary | ICD-10-CM | POA: Diagnosis not present

## 2022-09-24 DIAGNOSIS — Z510 Encounter for antineoplastic radiation therapy: Secondary | ICD-10-CM | POA: Diagnosis not present

## 2022-09-24 LAB — RAD ONC ARIA SESSION SUMMARY
Course Elapsed Days: 9
Plan Fractions Treated to Date: 8
Plan Prescribed Dose Per Fraction: 1.8 Gy
Plan Total Fractions Prescribed: 25
Plan Total Prescribed Dose: 45 Gy
Reference Point Dosage Given to Date: 14.4 Gy
Reference Point Session Dosage Given: 1.8 Gy
Session Number: 8

## 2022-09-24 LAB — URINE CULTURE: Culture: NO GROWTH

## 2022-09-25 ENCOUNTER — Telehealth: Payer: Self-pay

## 2022-09-25 ENCOUNTER — Ambulatory Visit
Admission: RE | Admit: 2022-09-25 | Discharge: 2022-09-25 | Disposition: A | Discharge status: Home / Self Care | Payer: Medicare Other | Source: Ambulatory Visit | Attending: Radiation Oncology

## 2022-09-25 ENCOUNTER — Other Ambulatory Visit: Payer: Self-pay

## 2022-09-25 DIAGNOSIS — C61 Malignant neoplasm of prostate: Secondary | ICD-10-CM | POA: Diagnosis not present

## 2022-09-25 DIAGNOSIS — Z510 Encounter for antineoplastic radiation therapy: Secondary | ICD-10-CM | POA: Diagnosis not present

## 2022-09-25 DIAGNOSIS — R300 Dysuria: Secondary | ICD-10-CM | POA: Diagnosis not present

## 2022-09-25 LAB — RAD ONC ARIA SESSION SUMMARY
Course Elapsed Days: 10
Plan Fractions Treated to Date: 9
Plan Prescribed Dose Per Fraction: 1.8 Gy
Plan Total Fractions Prescribed: 25
Plan Total Prescribed Dose: 45 Gy
Reference Point Dosage Given to Date: 16.2 Gy
Reference Point Session Dosage Given: 1.8 Gy
Session Number: 9

## 2022-09-25 NOTE — Telephone Encounter (Signed)
RN called patient give results of urinalysis and urine culture doesn't appear to be an infection.  Per [employer] PA-C will send in script for Flomax if needed for urinary symptoms.  RN had to leave message for patient to return call.

## 2022-09-27 NOTE — Assessment & Plan Note (Signed)
hgba1c acceptable, minimize simple carbs. Increase exercise as tolerated.  

## 2022-09-27 NOTE — Assessment & Plan Note (Signed)
Hydrate and monitor 

## 2022-09-27 NOTE — Assessment & Plan Note (Signed)
Well controlled, no changes to meds. Encouraged heart healthy diet such as the DASH diet and exercise as tolerated.

## 2022-09-28 ENCOUNTER — Other Ambulatory Visit: Payer: Self-pay

## 2022-09-28 ENCOUNTER — Telehealth: Payer: Self-pay

## 2022-09-28 ENCOUNTER — Ambulatory Visit
Admission: RE | Admit: 2022-09-28 | Discharge: 2022-09-28 | Disposition: A | Discharge status: Home / Self Care | Payer: Medicare Other | Source: Ambulatory Visit | Attending: Radiation Oncology

## 2022-09-28 ENCOUNTER — Ambulatory Visit (INDEPENDENT_AMBULATORY_CARE_PROVIDER_SITE_OTHER): Payer: Medicare Other | Admitting: Family Medicine

## 2022-09-28 VITALS — BP 128/78 | HR 58 | Temp 97.8°F | Resp 16 | Ht 73.0 in | Wt 243.2 lb

## 2022-09-28 DIAGNOSIS — M1A9XX0 Chronic gout, unspecified, without tophus (tophi): Secondary | ICD-10-CM | POA: Diagnosis not present

## 2022-09-28 DIAGNOSIS — M25511 Pain in right shoulder: Secondary | ICD-10-CM

## 2022-09-28 DIAGNOSIS — R739 Hyperglycemia, unspecified: Secondary | ICD-10-CM

## 2022-09-28 DIAGNOSIS — Z510 Encounter for antineoplastic radiation therapy: Secondary | ICD-10-CM | POA: Diagnosis not present

## 2022-09-28 DIAGNOSIS — R252 Cramp and spasm: Secondary | ICD-10-CM | POA: Diagnosis not present

## 2022-09-28 DIAGNOSIS — Z23 Encounter for immunization: Secondary | ICD-10-CM

## 2022-09-28 DIAGNOSIS — E782 Mixed hyperlipidemia: Secondary | ICD-10-CM | POA: Diagnosis not present

## 2022-09-28 DIAGNOSIS — R300 Dysuria: Secondary | ICD-10-CM | POA: Diagnosis not present

## 2022-09-28 DIAGNOSIS — C61 Malignant neoplasm of prostate: Secondary | ICD-10-CM | POA: Diagnosis not present

## 2022-09-28 DIAGNOSIS — I10 Essential (primary) hypertension: Secondary | ICD-10-CM | POA: Diagnosis not present

## 2022-09-28 DIAGNOSIS — F418 Other specified anxiety disorders: Secondary | ICD-10-CM

## 2022-09-28 LAB — RAD ONC ARIA SESSION SUMMARY
Course Elapsed Days: 13
Plan Fractions Treated to Date: 10
Plan Prescribed Dose Per Fraction: 1.8 Gy
Plan Total Fractions Prescribed: 25
Plan Total Prescribed Dose: 45 Gy
Reference Point Dosage Given to Date: 18 Gy
Reference Point Session Dosage Given: 1.8 Gy
Session Number: 10

## 2022-09-28 MED ORDER — FLUOXETINE HCL 10 MG PO TABS
20.0000 mg | ORAL_TABLET | Freq: Every day | ORAL | Status: DC
Start: 2022-09-28 — End: 2022-12-08

## 2022-09-28 NOTE — Telephone Encounter (Signed)
Called pt. x3 on all numbers on file to deliver U/A results and additional RX info. No answer. Voicemail left to return my call at [phone]   Ruel Favors, LPN

## 2022-09-28 NOTE — Patient Instructions (Signed)
10 ounces of water every 1-2 hours, 60-80 ounces in 24 ounces

## 2022-09-29 ENCOUNTER — Other Ambulatory Visit: Payer: Self-pay

## 2022-09-29 ENCOUNTER — Ambulatory Visit
Admission: RE | Admit: 2022-09-29 | Discharge: 2022-09-29 | Disposition: A | Discharge status: Home / Self Care | Payer: Medicare Other | Source: Ambulatory Visit | Attending: Radiation Oncology | Admitting: Radiation Oncology

## 2022-09-29 DIAGNOSIS — Z510 Encounter for antineoplastic radiation therapy: Secondary | ICD-10-CM | POA: Diagnosis not present

## 2022-09-29 DIAGNOSIS — R300 Dysuria: Secondary | ICD-10-CM | POA: Diagnosis not present

## 2022-09-29 DIAGNOSIS — C61 Malignant neoplasm of prostate: Secondary | ICD-10-CM | POA: Diagnosis not present

## 2022-09-29 LAB — RAD ONC ARIA SESSION SUMMARY
Course Elapsed Days: 14
Plan Fractions Treated to Date: 11
Plan Prescribed Dose Per Fraction: 1.8 Gy
Plan Total Fractions Prescribed: 25
Plan Total Prescribed Dose: 45 Gy
Reference Point Dosage Given to Date: 19.8 Gy
Reference Point Session Dosage Given: 1.8 Gy
Session Number: 11

## 2022-09-30 ENCOUNTER — Telehealth: Payer: Self-pay | Admitting: Family Medicine

## 2022-09-30 ENCOUNTER — Encounter: Payer: Self-pay | Admitting: Family Medicine

## 2022-09-30 ENCOUNTER — Other Ambulatory Visit: Payer: Self-pay

## 2022-09-30 ENCOUNTER — Ambulatory Visit
Admission: RE | Admit: 2022-09-30 | Discharge: 2022-09-30 | Disposition: A | Discharge status: Home / Self Care | Payer: Medicare Other | Source: Ambulatory Visit | Attending: Radiation Oncology | Admitting: Radiation Oncology

## 2022-09-30 DIAGNOSIS — C61 Malignant neoplasm of prostate: Secondary | ICD-10-CM | POA: Diagnosis not present

## 2022-09-30 DIAGNOSIS — M25511 Pain in right shoulder: Secondary | ICD-10-CM | POA: Insufficient documentation

## 2022-09-30 DIAGNOSIS — R300 Dysuria: Secondary | ICD-10-CM | POA: Diagnosis not present

## 2022-09-30 DIAGNOSIS — Z510 Encounter for antineoplastic radiation therapy: Secondary | ICD-10-CM | POA: Diagnosis not present

## 2022-09-30 LAB — RAD ONC ARIA SESSION SUMMARY
Course Elapsed Days: 15
Plan Fractions Treated to Date: 12
Plan Prescribed Dose Per Fraction: 1.8 Gy
Plan Total Fractions Prescribed: 25
Plan Total Prescribed Dose: 45 Gy
Reference Point Dosage Given to Date: 21.6 Gy
Reference Point Session Dosage Given: 1.8 Gy
Session Number: 12

## 2022-09-30 NOTE — Telephone Encounter (Signed)
[name] called to speak with nurse regarding his father's OV that took place on 9/16. Please call & advise.

## 2022-09-30 NOTE — Progress Notes (Signed)
Subjective:    Patient ID: David [name], male    DOB: [birth year], 77 y.o.   MRN: [id number]  Chief Complaint  Patient presents with  . Follow-up    Follow up    HPI Discussed the use of AI scribe software for clinical note transcription with the patient, who gave verbal consent to proceed.  History of Present Illness   The patient, a 77 year old with a history of hypertension, CHF, and BPH, is currently undergoing radiation treatment for prostate cancer. The patient reports experiencing memory loss, fatigue, and difficulty with speech. The patient's spouse has noticed a decline in the patient's cognitive function, including frequent loss of train of thought during conversations. The patient also reports a fall, difficulty with walking, and blurred vision. The patient has been experiencing nocturia and bowel changes, including smaller bowel movements and increased frequency of urination at night. The patient is currently on Lorazepam and Prozac.        Past Medical History:  Diagnosis Date  . Adenoma of right adrenal gland 07/11/2002   2.4cm , noted on CT ABD  . Anxiety   . Arthritis of both knees 03/26/2016  . Astigmatism   . Back pain   . BCC (basal cell carcinoma of skin)    under right eye and right ear  . Blood transfusion without reported diagnosis   . Cataract 09/29/2016  . Depression   . Diverticulitis 2009  . Diverticulosis   . Family history of breast cancer   . Family history of colon cancer   . Fatty liver   . GERD (gastroesophageal reflux disease)   . Gout   . Hearing loss of both ears 03/26/2016  . History of atrial fibrillation    x 2 none since 2020  . History of chronic prostatitis    started at age 48  . History of kidney stones   . Hx of adenomatous colonic polyps 02/16/2022   3 diminutive - no recall  . Hyperglycemia   . Hyperlipidemia   . Hypertension   . Incomplete right bundle branch block (RBBB)   . Internal hemorrhoids   . Kidney  lesion 06/07/2015   Right midportion, 1.1x1.1 cm hyperechoic, noted on [place] ABD  . LAFB (left anterior fascicular block)   . Lipoma of axilla 09/2016  . Liver lesion, right lobe 06/07/2015   2.5x2.4x2.4 cm hypoechoic lesion posterior aspect, noted on [place] ABD  . Low back pain 03/26/2016  . LVH (left ventricular hypertrophy) 08/06/2017   Moderate, Noted on ECHO  . Medicare annual wellness visit, subsequent 03/12/2014  . OA (osteoarthritis)    Back, Hands, Neck  . Obesity 11/25/2007   Qualifier: Diagnosis of  By: Kriste Basque MD, Lonzo Cloud   . Other malaise and fatigue 03/13/2013  . Premature ventricular complex   . Prostate cancer (HCC) dx 2021  . Renal insufficiency    Kidney removed right  . Scoliosis    Upper thoracic and lumbar  . Sinus bradycardia 08/06/2017   Noted on EKG  . Vitamin D deficiency    resolved    Past Surgical History:  Procedure Laterality Date  . CARDIOVERSION  07/31/2017  . CATARACT EXTRACTION, BILATERAL    . COLON SURGERY  2009   segmental sigmoid resection  . COLONOSCOPY    . COLONOSCOPY WITH PROPOFOL    . CYSTOSCOPY N/A 07/28/2019   Procedure: CYSTOSCOPY FLEXIBLE;  Surgeon: Malen Gauze, MD;  Location: [facility];  Service: Urology;  Laterality: N/A;  .  CYSTOSCOPY W/ RETROGRADES Right 06/07/2018   Procedure: CYSTOSCOPY WITH RETROGRADE PYELOGRAM, uphrostogram;  Surgeon: Malen Gauze, MD;  Location: [facility];  Service: Urology;  Laterality: Right;  . CYSTOSCOPY WITH URETEROSCOPY AND STENT PLACEMENT Right 02/28/2018   Procedure: CYSTOSCOPY WITH URETEROSCOPY Iverson Alamin;  Surgeon: Ihor Gully, MD;  Location: [facility];  Service: Urology;  Laterality: Right;  . CYSTOSCOPY/URETEROSCOPY/HOLMIUM LASER/STENT PLACEMENT Right 11/29/2017   Procedure: CYSTOSCOPY/RETROGRADE/URETEROSCOPY/HOLMIUM LASER/STENT PLACEMENT;  Surgeon: Ihor Gully, MD;  Location: [facility];  Service: Urology;  Laterality: Right;  . EYE SURGERY Bilateral  01/12/2017   cataract removal  . history of blood tranfusion  age 89  . HOLMIUM LASER APPLICATION Right 09/24/2017   Procedure: HOLMIUM LASER APPLICATION;  Surgeon: Ihor Gully, MD;  Location: [facility];  Service: Urology;  Laterality: Right;  . IR BALLOON DILATION URETERAL STRICTURE RIGHT  03/14/2018  . IR NEPHRO TUBE REMOV/FL  03/17/2018  . IR NEPHROSTOGRAM RIGHT THRU EXISTING ACCESS  03/17/2018  . IR NEPHROSTOMY EXCHANGE RIGHT  03/14/2018  . IR NEPHROSTOMY EXCHANGE RIGHT  06/21/2018  . IR NEPHROSTOMY PLACEMENT RIGHT  03/02/2018  . IR NEPHROSTOMY PLACEMENT RIGHT  04/20/2018  . IR URETERAL STENT PLACEMENT EXISTING ACCESS RIGHT  03/14/2018  . NOSE SURGERY     Submucous resection age 89  . NUCLEAR STRESS TEST  06/03/2009  . RADIOACTIVE SEED IMPLANT N/A 07/28/2019   Procedure: RADIOACTIVE SEED IMPLANT/BRACHYTHERAPY IMPLANT;  Surgeon: Malen Gauze, MD;  Location: [facility];  Service: Urology;  Laterality: N/A;  90 MINS  . ROBOT ASSISTED LAPAROSCOPIC NEPHRECTOMY Right 08/04/2018   Procedure: XI ROBOTIC ASSISTED LAPAROSCOPIC NEPHRECTOMY;  Surgeon: Malen Gauze, MD;  Location: [facility];  Service: Urology;  Laterality: Right;  3 HRS  . ROBOT ASSISTED PYELOPLASTY Right 06/07/2018   Procedure: attempted XI ROBOTIC ASSISTED PYELOPLASTY, lysis of adhesions;  Surgeon: Malen Gauze, MD;  Location: [facility];  Service: Urology;  Laterality: Right;  3 HRS  . SKIN BIOPSY    . SPACE OAR INSTILLATION N/A 07/28/2019   Procedure: SPACE OAR INSTILLATION;  Surgeon: Malen Gauze, MD;  Location: [facility];  Service: Urology;  Laterality: N/A;  . URETEROSCOPY WITH HOLMIUM LASER LITHOTRIPSY Right 09/24/2017   Procedure: CYSTOSCOPY, URETEROSCOPY WITH HOLMIUM LASER LITHOTRIPSY, STENT PLACEMENT;  Surgeon: Ihor Gully, MD;  Location: [facility];  Service: Urology;  Laterality: Right;    Family History  Problem Relation Age  of Onset  . Heart disease Mother   . Hypertension Mother   . Stroke Mother   . Colon cancer Mother 32  . Breast cancer Mother 76  . Heart disease Father        pacemaker  . Aortic aneurysm Father   . Hypertension Father   . Lung cancer Father 28  . Heart disease Sister   . Atrial fibrillation Sister   . Obesity Sister   . Sleep apnea Sister   . Heart attack Brother   . Other Brother        muscle disease  . Arthritis Brother   . Stroke Brother   . Atrial fibrillation Brother   . Heart attack Brother   . Atrial fibrillation Brother   . Diabetes Brother   . Atrial fibrillation Brother   . Heart attack Brother   . Hypertension Brother   . Hyperlipidemia Brother   . Heart attack Brother   . Other Brother        heart valve operation  .  Atrial fibrillation Brother   . Heart attack Maternal Grandmother   . Diabetes Maternal Grandmother   . Cancer Maternal Grandfather 44       hodgin's lymphoma  . Heart attack Paternal Grandmother   . Anxiety disorder Paternal Grandmother   . Pneumonia Paternal Grandfather   . Liver cancer Nephew 2       great nephew; d. 7  . Lymphoma Niece 91  . Esophageal cancer Neg Hx   . Rectal cancer Neg Hx   . Stomach cancer Neg Hx   . Colon polyps Neg Hx   . Pancreatic cancer Neg Hx     Social History   Socioeconomic History  . Marital status: Married    Spouse name: Not on file  . Number of children: 3  . Years of education: Not on file  . Highest education level: Not on file  Occupational History  . 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 Smoking status: Former    Current packs/day: 0.00    Average packs/day: 1 pack/day for 6.0 years (6.0 ttl pk-yrs)    Types: Cigarettes    Start date: 01/11/1969    Quit date: 01/12/1969    Years since quitting: 53.7  . Smokeless tobacco: Never  . Tobacco comments:    [id number]  Vaping Use  . Vaping status: Never Used  Substance and Sexual Activity  . Alcohol use: Not Currently     Comment: 2 whiskey drinks per day  . Drug use: Never  . Sexual activity: Yes  Other Topics Concern  . Not on file  Social History Narrative   The patient is married for the second time, he has 3 sons.   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   2 alcoholic beverages most days.   1 caffeinated beverage daily   No drug use no current tobacco use he is a prior smoker   12/24/2016   Social Determinants of Health   Financial Resource Strain: Low Risk  (02/20/2021)   Overall Financial Resource Strain (CARDIA)   . Difficulty of Paying Living Expenses: Not hard at all  Food Insecurity: Food Insecurity Present (08/03/2022)   Hunger Vital Sign   . Worried About Programme researcher, broadcasting/film/video in the Last Year: Sometimes true   . Ran Out of Food in the Last Year: Sometimes true  Transportation Needs: No Transportation Needs (08/03/2022)   PRAPARE - Transportation   . Lack of Transportation (Medical): No   . Lack of Transportation (Non-Medical): No  Physical Activity: Insufficiently Active (02/20/2021)   Exercise Vital Sign   . Days of Exercise per Week: 5 days   . Minutes of Exercise per Session: 20 min  Stress: Stress Concern Present (02/20/2021)   [employer] of Occupational Health - Occupational Stress Questionnaire   . Feeling of Stress : To some extent  Social Connections: Moderately Isolated (02/20/2021)   Social Connection and Isolation Panel [NHANES]   . Frequency of Communication with Friends and Family: More than three times a week   . Frequency of Social Gatherings with Friends and Family: Twice a week   . Attends Religious Services: Never   . Active Member of Clubs or Organizations: No   . Attends Banker Meetings: Never   . Marital Status: Married  Catering manager Violence: Not At Risk (08/03/2022)   Humiliation, Afraid, Rape, and Kick questionnaire   . Fear of Current or Ex-Partner: No   . Emotionally Abused: No   .  Physically Abused: No   . Sexually Abused: No     Outpatient Medications Prior to Visit  Medication Sig Dispense Refill  . acetaminophen (TYLENOL) 325 MG tablet Take 2 tablets (650 mg total) by mouth every 6 (six) hours as needed. 36 tablet 0  . allopurinol (ZYLOPRIM) 100 MG tablet Take 0.5 tablets (50 mg total) by mouth daily. 45 tablet 1  . ascorbic acid (VITAMIN C) 1000 MG tablet Take by mouth.    Marland Kitchen atenolol (TENORMIN) 50 MG tablet Take 1 tablet (50 mg total) by mouth 2 (two) times daily. 180 tablet 1  . atorvastatin (LIPITOR) 10 MG tablet TAKE ONE TABLET BY MOUTH EVERY MORNING 90 tablet 1  . Cholecalciferol (VITAMIN D-1000 MAX ST) 25 MCG (1000 UT) tablet Take by mouth.    . clotrimazole-betamethasone (LOTRISONE) lotion Apply topically 2 (two) times daily. For 2 weeks. (Patient taking differently: Apply topically daily. For 2 weeks.) 30 mL 0  . colchicine 0.6 MG tablet For gout flares, take 1 tab po once then repeat in 1 hour as needed if pain persists up to a total of 3 doses 15 tablet 0  . ELIQUIS 5 MG TABS tablet TAKE 1 TABLET BY MOUTH TWICE A DAY 60 tablet 5  . fluconazole (DIFLUCAN) 150 MG tablet 1 tab po monthly and prn dermatitis 10 tablet 2  . fluticasone (FLONASE) 50 MCG/ACT nasal spray Place 2 sprays into both nostrils daily. 16 g 1  . ketoconazole (NIZORAL) 2 % cream Apply 1 Application topically daily. 60 g 2  . lidocaine (LIDODERM) 5 % Place 1 patch onto the skin daily as needed. Remove & Discard patch within 12 hours or as directed by MD 15 patch 0  . LORazepam (ATIVAN) 1 MG tablet TAKE ONE TABLET BY MOUTH EVERY 8 HOURS AS NEEDED FOR ANXIETY 90 tablet 1  . omeprazole (PRILOSEC) 20 MG capsule Take 20 mg by mouth daily as needed (acid reflux).     Marland Kitchen tiZANidine (ZANAFLEX) 2 MG tablet Take 0.5-2 tablets (1-4 mg total) by mouth every 8 (eight) hours as needed for muscle spasms (joint pain). 40 tablet 1  . triamcinolone (KENALOG) 0.025 % cream SMARTSIG:1 Topical Daily    . FLUoxetine (PROZAC) 10 MG tablet Take 1 tablet (10 mg  total) by mouth 3 (three) times daily. 270 tablet 1   No facility-administered medications prior to visit.    Allergies  Allergen Reactions  . Cefaclor Hives  . Cephalosporins Hives  . Penicillins Rash    Mild maculopapular rash Has patient had a PCN reaction causing immediate rash, facial/tongue/throat swelling, SOB or lightheadedness with hypotension: No Has patient had a PCN reaction causing severe rash involving mucus membranes or skin necrosis: No Has patient had a PCN reaction that required hospitalization: No Has patient had a PCN reaction occurring within the last 10 years: No If all of the above answers are "NO", then may proceed with Cephalosporin use.     Review of Systems  Constitutional:  Positive for malaise/fatigue. Negative for fever.  HENT:  Negative for congestion.   Eyes:  Negative for blurred vision.  Respiratory:  Negative for shortness of breath.   Cardiovascular:  Negative for chest pain, palpitations and leg swelling.  Gastrointestinal:  Negative for abdominal pain, blood in stool and nausea.  Genitourinary:  Negative for dysuria and frequency.  Musculoskeletal:  Positive for joint pain. Negative for falls.  Skin:  Negative for rash.  Neurological:  Negative for dizziness, loss of consciousness and headaches.  Endo/Heme/Allergies:  Negative for environmental allergies.  Psychiatric/Behavioral:  Positive for memory loss. Negative for depression. The patient is nervous/anxious.       Objective:    Physical Exam  BP 128/78 (BP Location: Left Arm, Patient Position: Sitting, Cuff Size: Large)   Pulse (!) 58   Temp 97.8 F (36.6 C) (Oral)   Resp 16   Ht 6\' 1"  (1.854 m)   Wt 243 lb 3.2 oz (110.3 kg)   SpO2 92%   BMI 32.09 kg/m  Wt Readings from Last 3 Encounters:  09/28/22 243 lb 3.2 oz (110.3 kg)  09/15/22 246 lb 14.6 oz (112 kg)  08/25/22 247 lb (112 kg)    Diabetic Foot Exam - Simple   No data filed    Lab Results  Component Value Date    WBC 6.6 07/23/2022   HGB 13.8 07/23/2022   HCT 41.8 07/23/2022   PLT 219.0 07/23/2022   GLUCOSE 110 (H) 07/23/2022   CHOL 152 07/23/2022   TRIG 168.0 (H) 07/23/2022   HDL 33.50 (L) 07/23/2022   LDLDIRECT 168.5 03/29/2006   LDLCALC 85 07/23/2022   ALT 26 07/23/2022   AST 24 07/23/2022   NA 138 07/23/2022   K 3.9 07/23/2022   CL 104 07/23/2022   CREATININE 1.82 (H) 07/23/2022   BUN 19 07/23/2022   CO2 26 07/23/2022   TSH 3.25 07/23/2022   PSA 4.01 (H) 07/16/2021   INR 1.0 07/25/2019   HGBA1C 5.7 07/23/2022    Lab Results  Component Value Date   TSH 3.25 07/23/2022   Lab Results  Component Value Date   WBC 6.6 07/23/2022   HGB 13.8 07/23/2022   HCT 41.8 07/23/2022   MCV 96.4 07/23/2022   PLT 219.0 07/23/2022   Lab Results  Component Value Date   NA 138 07/23/2022   K 3.9 07/23/2022   CO2 26 07/23/2022   GLUCOSE 110 (H) 07/23/2022   BUN 19 07/23/2022   CREATININE 1.82 (H) 07/23/2022   BILITOT 0.7 07/23/2022   ALKPHOS 76 07/23/2022   AST 24 07/23/2022   ALT 26 07/23/2022   PROT 6.5 07/23/2022   ALBUMIN 4.0 07/23/2022   CALCIUM 10.3 07/23/2022   ANIONGAP 6 12/13/2021   EGFR 37 (L) 12/18/2021   GFR 35.46 (L) 07/23/2022   Lab Results  Component Value Date   CHOL 152 07/23/2022   Lab Results  Component Value Date   HDL 33.50 (L) 07/23/2022   Lab Results  Component Value Date   LDLCALC 85 07/23/2022   Lab Results  Component Value Date   TRIG 168.0 (H) 07/23/2022   Lab Results  Component Value Date   CHOLHDL 5 07/23/2022   Lab Results  Component Value Date   HGBA1C 5.7 07/23/2022       Assessment & Plan:  Essential hypertension, benign Assessment & Plan: Well controlled, no changes to meds. Encouraged heart healthy diet such as the DASH diet and exercise as tolerated.   Orders: -     Comprehensive metabolic panel; Future -     CBC with Differential/Platelet; Future -     TSH; Future -     T4, free; Future  Chronic gout without tophus,  unspecified cause, unspecified site Assessment & Plan: Hydrate and monitor    Hyperglycemia Assessment & Plan: hgba1c acceptable, minimize simple carbs. Increase exercise as tolerated.  Orders: -     Hemoglobin A1c; Future  Depression with anxiety -     FLUoxetine HCl; Take 2 tablets (  20 mg total) by mouth at bedtime.  Muscle cramp  Hyperlipidemia, mixed -     Lipid panel; Future  Right shoulder pain, unspecified chronicity -     DG Shoulder Right; Future  Flu vaccine need -     Flu Vaccine Trivalent High Dose (Fluad)    Assessment and Plan    Cognitive Impairment Noted memory loss, difficulty with word finding, and loss of train of thought during conversations. -Encourage regular mental stimulation and physical activity. -Refer for ophthalmology and audiology evaluations to optimize sensory input.  Visual Impairment Reports of blurred vision and decreased visual acuity. -Schedule an ophthalmology appointment for a comprehensive eye examination.  Prostate Cancer Undergoing daily radiation therapy with noted fatigue and urinary frequency. -Continue current treatment plan. -Encourage hydration and protein intake. -Plan to check kidney function in one month.  Insomnia Difficulty sleeping despite taking Lorazepam and Prozac twice daily. -Consolidate Prozac to 20mg  at bedtime along with Lorazepam. -Consider reducing Lorazepam to bedtime only.  Circumcision Recurrent penile skin issues responsive to Ketoconazole. -Consider circumcision if deemed safe by urologist.  General Health Maintenance -Administer influenza vaccine and COVID-19 booster today. -Encourage use of multivitamin and fish oil supplements. -Order shoulder x-ray due to chronic pain. -Schedule blood work for the second half of October. -Plan follow-up appointment in November.         Danise Edge, MD

## 2022-09-30 NOTE — Assessment & Plan Note (Signed)
Right shoulder xray and consider treatment if needed. Tylenol 1000 mg tid

## 2022-10-01 ENCOUNTER — Ambulatory Visit
Admission: RE | Admit: 2022-10-01 | Discharge: 2022-10-01 | Disposition: A | Discharge status: Home / Self Care | Payer: Medicare Other | Source: Ambulatory Visit | Attending: Radiation Oncology | Admitting: Radiation Oncology

## 2022-10-01 ENCOUNTER — Other Ambulatory Visit: Payer: Self-pay

## 2022-10-01 DIAGNOSIS — R300 Dysuria: Secondary | ICD-10-CM | POA: Diagnosis not present

## 2022-10-01 DIAGNOSIS — Z510 Encounter for antineoplastic radiation therapy: Secondary | ICD-10-CM | POA: Diagnosis not present

## 2022-10-01 DIAGNOSIS — C61 Malignant neoplasm of prostate: Secondary | ICD-10-CM | POA: Diagnosis not present

## 2022-10-01 LAB — RAD ONC ARIA SESSION SUMMARY
Course Elapsed Days: 16
Plan Fractions Treated to Date: 13
Plan Prescribed Dose Per Fraction: 1.8 Gy
Plan Total Fractions Prescribed: 25
Plan Total Prescribed Dose: 45 Gy
Reference Point Dosage Given to Date: 23.4 Gy
Reference Point Session Dosage Given: 1.8 Gy
Session Number: 13

## 2022-10-01 NOTE — Telephone Encounter (Signed)
Will leave for when [facility] returns.

## 2022-10-02 ENCOUNTER — Ambulatory Visit
Admission: RE | Admit: 2022-10-02 | Discharge: 2022-10-02 | Disposition: A | Discharge status: Home / Self Care | Payer: Medicare Other | Source: Ambulatory Visit | Attending: Radiation Oncology | Admitting: Radiation Oncology

## 2022-10-02 ENCOUNTER — Ambulatory Visit
Admission: RE | Admit: 2022-10-02 | Discharge: 2022-10-02 | Disposition: A | Discharge status: Home / Self Care | Payer: Medicare Other | Source: Ambulatory Visit | Attending: Radiation Oncology

## 2022-10-02 ENCOUNTER — Telehealth: Payer: Self-pay

## 2022-10-02 ENCOUNTER — Other Ambulatory Visit: Payer: Self-pay

## 2022-10-02 DIAGNOSIS — R300 Dysuria: Secondary | ICD-10-CM | POA: Diagnosis not present

## 2022-10-02 DIAGNOSIS — Z510 Encounter for antineoplastic radiation therapy: Secondary | ICD-10-CM | POA: Diagnosis not present

## 2022-10-02 DIAGNOSIS — C61 Malignant neoplasm of prostate: Secondary | ICD-10-CM | POA: Diagnosis not present

## 2022-10-02 LAB — RAD ONC ARIA SESSION SUMMARY
Course Elapsed Days: 17
Plan Fractions Treated to Date: 14
Plan Prescribed Dose Per Fraction: 1.8 Gy
Plan Total Fractions Prescribed: 25
Plan Total Prescribed Dose: 45 Gy
Reference Point Dosage Given to Date: 25.2 Gy
Reference Point Session Dosage Given: 1.8 Gy
Session Number: 14

## 2022-10-02 NOTE — Telephone Encounter (Signed)
RN spoke with Angelene Giovanni (son) to inform him of radiation therapist request that someone comes in with patient because he's a little forgetful and goes straight to the restroom to empty bladder and they need him to have a full bladder for treatment.  [name] stated he would talk to patient and Mrs. Tetrick to see if she could come in with patient for his treatment.

## 2022-10-02 NOTE — Telephone Encounter (Signed)
Called pt son lvm was returning a call to call our office back.

## 2022-10-05 ENCOUNTER — Ambulatory Visit
Admission: RE | Admit: 2022-10-05 | Discharge: 2022-10-05 | Disposition: A | Discharge status: Home / Self Care | Payer: Medicare Other | Source: Ambulatory Visit | Attending: Radiation Oncology | Admitting: Radiation Oncology

## 2022-10-05 ENCOUNTER — Other Ambulatory Visit: Payer: Self-pay

## 2022-10-05 DIAGNOSIS — Z510 Encounter for antineoplastic radiation therapy: Secondary | ICD-10-CM | POA: Diagnosis not present

## 2022-10-05 DIAGNOSIS — C61 Malignant neoplasm of prostate: Secondary | ICD-10-CM | POA: Diagnosis not present

## 2022-10-05 DIAGNOSIS — R300 Dysuria: Secondary | ICD-10-CM | POA: Diagnosis not present

## 2022-10-05 LAB — RAD ONC ARIA SESSION SUMMARY
Course Elapsed Days: 20
Plan Fractions Treated to Date: 15
Plan Prescribed Dose Per Fraction: 1.8 Gy
Plan Total Fractions Prescribed: 25
Plan Total Prescribed Dose: 45 Gy
Reference Point Dosage Given to Date: 27 Gy
Reference Point Session Dosage Given: 1.8 Gy
Session Number: 15

## 2022-10-06 ENCOUNTER — Other Ambulatory Visit: Payer: Self-pay

## 2022-10-06 ENCOUNTER — Ambulatory Visit
Admission: RE | Admit: 2022-10-06 | Discharge: 2022-10-06 | Disposition: A | Discharge status: Home / Self Care | Payer: Medicare Other | Source: Ambulatory Visit | Attending: Radiation Oncology | Admitting: Radiation Oncology

## 2022-10-06 DIAGNOSIS — C61 Malignant neoplasm of prostate: Secondary | ICD-10-CM | POA: Diagnosis not present

## 2022-10-06 DIAGNOSIS — Z510 Encounter for antineoplastic radiation therapy: Secondary | ICD-10-CM | POA: Diagnosis not present

## 2022-10-06 DIAGNOSIS — R300 Dysuria: Secondary | ICD-10-CM | POA: Diagnosis not present

## 2022-10-06 LAB — RAD ONC ARIA SESSION SUMMARY
Course Elapsed Days: 21
Plan Fractions Treated to Date: 16
Plan Prescribed Dose Per Fraction: 1.8 Gy
Plan Total Fractions Prescribed: 25
Plan Total Prescribed Dose: 45 Gy
Reference Point Dosage Given to Date: 28.8 Gy
Reference Point Session Dosage Given: 1.8 Gy
Session Number: 16

## 2022-10-07 ENCOUNTER — Encounter: Payer: Self-pay | Admitting: Physician Assistant

## 2022-10-07 ENCOUNTER — Other Ambulatory Visit: Payer: Self-pay

## 2022-10-07 ENCOUNTER — Ambulatory Visit
Admission: RE | Admit: 2022-10-07 | Discharge: 2022-10-07 | Disposition: A | Discharge status: Home / Self Care | Payer: Medicare Other | Source: Ambulatory Visit | Attending: Radiation Oncology

## 2022-10-07 ENCOUNTER — Ambulatory Visit
Admission: RE | Admit: 2022-10-07 | Discharge: 2022-10-07 | Disposition: A | Discharge status: Home / Self Care | Payer: Medicare Other | Source: Ambulatory Visit | Attending: Radiation Oncology | Admitting: Radiation Oncology

## 2022-10-07 ENCOUNTER — Ambulatory Visit: Payer: Medicare Other | Admitting: Physician Assistant

## 2022-10-07 VITALS — BP 126/82 | HR 47 | Temp 98.2°F | Resp 18 | Ht 73.0 in | Wt 232.4 lb

## 2022-10-07 DIAGNOSIS — C61 Malignant neoplasm of prostate: Secondary | ICD-10-CM | POA: Diagnosis not present

## 2022-10-07 DIAGNOSIS — Z510 Encounter for antineoplastic radiation therapy: Secondary | ICD-10-CM | POA: Diagnosis not present

## 2022-10-07 DIAGNOSIS — M25511 Pain in right shoulder: Secondary | ICD-10-CM

## 2022-10-07 DIAGNOSIS — R300 Dysuria: Secondary | ICD-10-CM | POA: Diagnosis not present

## 2022-10-07 LAB — RAD ONC ARIA SESSION SUMMARY
Course Elapsed Days: 22
Plan Fractions Treated to Date: 17
Plan Prescribed Dose Per Fraction: 1.8 Gy
Plan Total Fractions Prescribed: 25
Plan Total Prescribed Dose: 45 Gy
Reference Point Dosage Given to Date: 30.6 Gy
Reference Point Session Dosage Given: 1.8 Gy
Session Number: 17

## 2022-10-07 NOTE — Progress Notes (Signed)
Established patient visit   Patient: David [name]   DOB: 11-19-1945   77 y.o. Male  MRN: [id number] Visit Date: 10/07/2022  Today's healthcare provider: Alfredia Ferguson, PA-C   Cc. Right shoulder pain  Subjective    HPI  Pt reports for an ED follow up, with a history of prostate cancer undergoing treatment, on Eliquis, he was seen 09/15/22 for a fall onto his hands/feet. Imaging of lumbar spine with no acute findings.  He was seen in office 9/16 to see Dr Abner Greenspan, concerns over right shoulder pain post fall. An xray was ordered but not completed. He did have an xray of the right shoulder 6/24 that demonstrated degenerative changes at the [facility] joint.   Today patient reports persistent right shoulder pain, anteriorly. Reports pain initially improved, but he picked up something heavy and it exacerbated his pain. He has full ROM with some pain, tenderness to touch. Denies paresthesias, radiating pain. Using otc creams with improvement.   Medications: Outpatient Medications Prior to Visit  Medication Sig Note   acetaminophen (TYLENOL) 325 MG tablet Take 2 tablets (650 mg total) by mouth every 6 (six) hours as needed.    allopurinol (ZYLOPRIM) 100 MG tablet Take 0.5 tablets (50 mg total) by mouth daily.    ascorbic acid (VITAMIN C) 1000 MG tablet Take by mouth.    atenolol (TENORMIN) 50 MG tablet Take 1 tablet (50 mg total) by mouth 2 (two) times daily.    atorvastatin (LIPITOR) 10 MG tablet TAKE ONE TABLET BY MOUTH EVERY MORNING    Cholecalciferol (VITAMIN D-1000 MAX ST) 25 MCG (1000 UT) tablet Take by mouth.    clotrimazole-betamethasone (LOTRISONE) lotion Apply topically 2 (two) times daily. For 2 weeks. (Patient taking differently: Apply topically daily. For 2 weeks.) 10/07/2022: PRN   colchicine 0.6 MG tablet For gout flares, take 1 tab po once then repeat in 1 hour as needed if pain persists up to a total of 3 doses 10/07/2022: prn   ELIQUIS 5 MG TABS tablet TAKE 1 TABLET BY MOUTH  TWICE A DAY    fluconazole (DIFLUCAN) 150 MG tablet 1 tab po monthly and prn dermatitis    FLUoxetine (PROZAC) 10 MG tablet Take 2 tablets (20 mg total) by mouth at bedtime.    fluticasone (FLONASE) 50 MCG/ACT nasal spray Place 2 sprays into both nostrils daily.    ketoconazole (NIZORAL) 2 % cream Apply 1 Application topically daily. 10/07/2022: PRN   LORazepam (ATIVAN) 1 MG tablet TAKE ONE TABLET BY MOUTH EVERY 8 HOURS AS NEEDED FOR ANXIETY    omeprazole (PRILOSEC) 20 MG capsule Take 20 mg by mouth daily as needed (acid reflux).     lidocaine (LIDODERM) 5 % Place 1 patch onto the skin daily as needed. Remove & Discard patch within 12 hours or as directed by MD (Patient not taking: Reported on 10/07/2022)    tiZANidine (ZANAFLEX) 2 MG tablet Take 0.5-2 tablets (1-4 mg total) by mouth every 8 (eight) hours as needed for muscle spasms (joint pain). (Patient not taking: Reported on 10/07/2022)    triamcinolone (KENALOG) 0.025 % cream SMARTSIG:1 Topical Daily (Patient not taking: Reported on 10/07/2022)    No facility-administered medications prior to visit.    Review of Systems  Constitutional:  Negative for fatigue and fever.  Respiratory:  Negative for cough and shortness of breath.   Cardiovascular:  Negative for chest pain, palpitations and leg swelling.  Musculoskeletal:  Positive for arthralgias.  Neurological:  Negative  for dizziness and headaches.      Objective    BP 126/82   Pulse (!) 47   Temp 98.2 F (36.8 C) (Oral)   Resp 18   Ht 6\' 1"  (1.854 m)   Wt 232 lb 6 oz (105.4 kg)   SpO2 96%   BMI 30.66 kg/m    Physical Exam Constitutional:      General: He is awake.     Appearance: He is well-developed.  HENT:     Head: Normocephalic.  Eyes:     Conjunctiva/sclera: Conjunctivae normal.  Cardiovascular:     Rate and Rhythm: Normal rate and regular rhythm.     Heart sounds: Normal heart sounds.  Pulmonary:     Effort: Pulmonary effort is normal.     Breath sounds: Normal  breath sounds.  Musculoskeletal:     Comments: Tenderness to anterior right shoulder Negative empty cup test  Normal/full ROM of right shoulder, with both a positive and negative drop arm test when repeated  Skin:    General: Skin is warm.  Neurological:     Mental Status: He is alert and oriented to person, place, and time.  Psychiatric:        Attention and Perception: Attention normal.        Mood and Affect: Mood normal.        Speech: Speech normal.        Behavior: Behavior is cooperative.      No results found for any visits on 10/07/22.  Assessment & Plan     1. Acute pain of right shoulder Xray was ordered last visit, pt did not complete.  Unsure if it would be helpful at this point. Reviewed exercises at home, cont topical cream, ice/heat.  Importance if keeping shoulder mobile.  Offered referral to PT or ortho, pt declines for now.  Return if symptoms worsen or fail to improve.      I, Alfredia Ferguson, PA-C have reviewed all documentation for this visit. The documentation on  10/07/22   for the exam, diagnosis, procedures, and orders are all accurate and complete.    Alfredia Ferguson, PA-C  [facility] [phone] (phone) [phone] (fax)  [facility]

## 2022-10-08 ENCOUNTER — Other Ambulatory Visit: Payer: Self-pay | Admitting: Family Medicine

## 2022-10-08 ENCOUNTER — Ambulatory Visit
Admission: RE | Admit: 2022-10-08 | Discharge: 2022-10-08 | Disposition: A | Discharge status: Home / Self Care | Payer: Medicare Other | Source: Ambulatory Visit | Attending: Radiation Oncology | Admitting: Radiation Oncology

## 2022-10-08 ENCOUNTER — Other Ambulatory Visit: Payer: Self-pay

## 2022-10-08 DIAGNOSIS — F418 Other specified anxiety disorders: Secondary | ICD-10-CM

## 2022-10-08 DIAGNOSIS — R300 Dysuria: Secondary | ICD-10-CM | POA: Diagnosis not present

## 2022-10-08 DIAGNOSIS — C61 Malignant neoplasm of prostate: Secondary | ICD-10-CM | POA: Diagnosis not present

## 2022-10-08 DIAGNOSIS — Z510 Encounter for antineoplastic radiation therapy: Secondary | ICD-10-CM | POA: Diagnosis not present

## 2022-10-08 LAB — RAD ONC ARIA SESSION SUMMARY
Course Elapsed Days: 23
Plan Fractions Treated to Date: 18
Plan Prescribed Dose Per Fraction: 1.8 Gy
Plan Total Fractions Prescribed: 25
Plan Total Prescribed Dose: 45 Gy
Reference Point Dosage Given to Date: 32.4 Gy
Reference Point Session Dosage Given: 1.8 Gy
Session Number: 18

## 2022-10-09 ENCOUNTER — Other Ambulatory Visit: Payer: Self-pay

## 2022-10-09 ENCOUNTER — Ambulatory Visit
Admission: RE | Admit: 2022-10-09 | Discharge: 2022-10-09 | Disposition: A | Discharge status: Home / Self Care | Payer: Medicare Other | Source: Ambulatory Visit | Attending: Radiation Oncology | Admitting: Radiation Oncology

## 2022-10-09 DIAGNOSIS — R300 Dysuria: Secondary | ICD-10-CM | POA: Diagnosis not present

## 2022-10-09 DIAGNOSIS — Z510 Encounter for antineoplastic radiation therapy: Secondary | ICD-10-CM | POA: Diagnosis not present

## 2022-10-09 DIAGNOSIS — C61 Malignant neoplasm of prostate: Secondary | ICD-10-CM | POA: Diagnosis not present

## 2022-10-09 LAB — RAD ONC ARIA SESSION SUMMARY
Course Elapsed Days: 24
Plan Fractions Treated to Date: 19
Plan Prescribed Dose Per Fraction: 1.8 Gy
Plan Total Fractions Prescribed: 25
Plan Total Prescribed Dose: 45 Gy
Reference Point Dosage Given to Date: 34.2 Gy
Reference Point Session Dosage Given: 1.8 Gy
Session Number: 19

## 2022-10-12 ENCOUNTER — Other Ambulatory Visit: Payer: Self-pay

## 2022-10-12 ENCOUNTER — Ambulatory Visit
Admission: RE | Admit: 2022-10-12 | Discharge: 2022-10-12 | Disposition: A | Discharge status: Home / Self Care | Payer: Medicare Other | Source: Ambulatory Visit | Attending: Radiation Oncology

## 2022-10-12 DIAGNOSIS — Z510 Encounter for antineoplastic radiation therapy: Secondary | ICD-10-CM | POA: Diagnosis not present

## 2022-10-12 DIAGNOSIS — C61 Malignant neoplasm of prostate: Secondary | ICD-10-CM | POA: Diagnosis not present

## 2022-10-12 DIAGNOSIS — R300 Dysuria: Secondary | ICD-10-CM | POA: Diagnosis not present

## 2022-10-12 LAB — RAD ONC ARIA SESSION SUMMARY
Course Elapsed Days: 27
Plan Fractions Treated to Date: 20
Plan Prescribed Dose Per Fraction: 1.8 Gy
Plan Total Fractions Prescribed: 25
Plan Total Prescribed Dose: 45 Gy
Reference Point Dosage Given to Date: 36 Gy
Reference Point Session Dosage Given: 1.8 Gy
Session Number: 20

## 2022-10-13 ENCOUNTER — Ambulatory Visit
Admission: RE | Admit: 2022-10-13 | Discharge: 2022-10-13 | Disposition: A | Discharge status: Home / Self Care | Payer: Medicare Other | Source: Ambulatory Visit | Attending: Radiation Oncology | Admitting: Radiation Oncology

## 2022-10-13 ENCOUNTER — Other Ambulatory Visit: Payer: Self-pay

## 2022-10-13 ENCOUNTER — Telehealth: Payer: Self-pay

## 2022-10-13 DIAGNOSIS — R300 Dysuria: Secondary | ICD-10-CM | POA: Insufficient documentation

## 2022-10-13 DIAGNOSIS — Z510 Encounter for antineoplastic radiation therapy: Secondary | ICD-10-CM | POA: Diagnosis not present

## 2022-10-13 DIAGNOSIS — C61 Malignant neoplasm of prostate: Secondary | ICD-10-CM | POA: Diagnosis present

## 2022-10-13 LAB — RAD ONC ARIA SESSION SUMMARY
Course Elapsed Days: 28
Plan Fractions Treated to Date: 21
Plan Prescribed Dose Per Fraction: 1.8 Gy
Plan Total Fractions Prescribed: 25
Plan Total Prescribed Dose: 45 Gy
Reference Point Dosage Given to Date: 37.8 Gy
Reference Point Session Dosage Given: 1.8 Gy
Session Number: 21

## 2022-10-13 NOTE — Telephone Encounter (Signed)
RN per Marcello Fennel, PA-C called patient spoke with wife as a reminder to patient to have a full bladder prior to coming in for treatment.  Remind him that once treatment is finished he can empty bladder in bathroom not before treatment.  Patient had complaints of not emptying bladder and therapists reports they have to give patient water in order to treat due to him going straight to bathroom when he comes in for treatment as they have seen him walk into bathroom in waiting room before.  I asked wife if she would be able to come in with patient for his treatment not able she using walker to get around and just too much for her.  Will continue to show support for patient and needs.

## 2022-10-14 ENCOUNTER — Other Ambulatory Visit: Payer: Self-pay

## 2022-10-14 ENCOUNTER — Ambulatory Visit
Admission: RE | Admit: 2022-10-14 | Discharge: 2022-10-14 | Disposition: A | Discharge status: Home / Self Care | Payer: Medicare Other | Source: Ambulatory Visit | Attending: Radiation Oncology

## 2022-10-14 DIAGNOSIS — Z510 Encounter for antineoplastic radiation therapy: Secondary | ICD-10-CM | POA: Diagnosis not present

## 2022-10-14 DIAGNOSIS — R300 Dysuria: Secondary | ICD-10-CM | POA: Diagnosis not present

## 2022-10-14 DIAGNOSIS — C61 Malignant neoplasm of prostate: Secondary | ICD-10-CM | POA: Diagnosis not present

## 2022-10-14 LAB — RAD ONC ARIA SESSION SUMMARY
Course Elapsed Days: 29
Plan Fractions Treated to Date: 22
Plan Prescribed Dose Per Fraction: 1.8 Gy
Plan Total Fractions Prescribed: 25
Plan Total Prescribed Dose: 45 Gy
Reference Point Dosage Given to Date: 39.6 Gy
Reference Point Session Dosage Given: 1.8 Gy
Session Number: 22

## 2022-10-15 ENCOUNTER — Other Ambulatory Visit: Payer: Self-pay

## 2022-10-15 ENCOUNTER — Ambulatory Visit
Admission: RE | Admit: 2022-10-15 | Discharge: 2022-10-15 | Disposition: A | Discharge status: Home / Self Care | Payer: Medicare Other | Source: Ambulatory Visit | Attending: Radiation Oncology | Admitting: Radiation Oncology

## 2022-10-15 DIAGNOSIS — Z510 Encounter for antineoplastic radiation therapy: Secondary | ICD-10-CM | POA: Diagnosis not present

## 2022-10-15 DIAGNOSIS — R300 Dysuria: Secondary | ICD-10-CM | POA: Diagnosis not present

## 2022-10-15 DIAGNOSIS — C61 Malignant neoplasm of prostate: Secondary | ICD-10-CM | POA: Diagnosis not present

## 2022-10-15 LAB — RAD ONC ARIA SESSION SUMMARY
Course Elapsed Days: 30
Plan Fractions Treated to Date: 23
Plan Prescribed Dose Per Fraction: 1.8 Gy
Plan Total Fractions Prescribed: 25
Plan Total Prescribed Dose: 45 Gy
Reference Point Dosage Given to Date: 41.4 Gy
Reference Point Session Dosage Given: 1.8 Gy
Session Number: 23

## 2022-10-16 ENCOUNTER — Ambulatory Visit
Admission: RE | Admit: 2022-10-16 | Discharge: 2022-10-16 | Disposition: A | Discharge status: Home / Self Care | Payer: Medicare Other | Source: Ambulatory Visit | Attending: Radiation Oncology | Admitting: Radiation Oncology

## 2022-10-16 ENCOUNTER — Other Ambulatory Visit: Payer: Self-pay

## 2022-10-16 ENCOUNTER — Ambulatory Visit
Admission: RE | Admit: 2022-10-16 | Discharge: 2022-10-16 | Disposition: A | Discharge status: Home / Self Care | Payer: Medicare Other | Source: Ambulatory Visit | Attending: Radiation Oncology

## 2022-10-16 DIAGNOSIS — C61 Malignant neoplasm of prostate: Secondary | ICD-10-CM | POA: Diagnosis not present

## 2022-10-16 DIAGNOSIS — R300 Dysuria: Secondary | ICD-10-CM | POA: Diagnosis not present

## 2022-10-16 DIAGNOSIS — Z510 Encounter for antineoplastic radiation therapy: Secondary | ICD-10-CM | POA: Diagnosis not present

## 2022-10-16 LAB — RAD ONC ARIA SESSION SUMMARY
Course Elapsed Days: 31
Plan Fractions Treated to Date: 24
Plan Prescribed Dose Per Fraction: 1.8 Gy
Plan Total Fractions Prescribed: 25
Plan Total Prescribed Dose: 45 Gy
Reference Point Dosage Given to Date: 43.2 Gy
Reference Point Session Dosage Given: 1.8 Gy
Session Number: 24

## 2022-10-17 ENCOUNTER — Encounter (HOSPITAL_BASED_OUTPATIENT_CLINIC_OR_DEPARTMENT_OTHER): Payer: Self-pay

## 2022-10-17 ENCOUNTER — Other Ambulatory Visit: Payer: Self-pay

## 2022-10-17 ENCOUNTER — Emergency Department (HOSPITAL_BASED_OUTPATIENT_CLINIC_OR_DEPARTMENT_OTHER)
Admission: EM | Admit: 2022-10-17 | Discharge: 2022-10-17 | Disposition: A | Discharge status: Home / Self Care | Payer: Medicare Other | Attending: Emergency Medicine | Admitting: Emergency Medicine

## 2022-10-17 DIAGNOSIS — Z7901 Long term (current) use of anticoagulants: Secondary | ICD-10-CM | POA: Insufficient documentation

## 2022-10-17 DIAGNOSIS — Z8546 Personal history of malignant neoplasm of prostate: Secondary | ICD-10-CM | POA: Insufficient documentation

## 2022-10-17 DIAGNOSIS — R300 Dysuria: Secondary | ICD-10-CM | POA: Diagnosis not present

## 2022-10-17 DIAGNOSIS — I110 Hypertensive heart disease with heart failure: Secondary | ICD-10-CM | POA: Diagnosis not present

## 2022-10-17 DIAGNOSIS — I509 Heart failure, unspecified: Secondary | ICD-10-CM | POA: Insufficient documentation

## 2022-10-17 DIAGNOSIS — K5900 Constipation, unspecified: Secondary | ICD-10-CM | POA: Insufficient documentation

## 2022-10-17 LAB — BASIC METABOLIC PANEL
Anion gap: 14 (ref 5–15)
BUN: 20 mg/dL (ref 8–23)
CO2: 22 mmol/L (ref 22–32)
Calcium: 10.3 mg/dL (ref 8.9–10.3)
Chloride: 102 mmol/L (ref 98–111)
Creatinine, Ser: 1.61 mg/dL — ABNORMAL HIGH (ref 0.61–1.24)
GFR, Estimated: 44 mL/min — ABNORMAL LOW (ref >60)
Glucose, Bld: 104 mg/dL — ABNORMAL HIGH (ref 70–99)
Potassium: 3.8 mmol/L (ref 3.5–5.1)
Sodium: 138 mmol/L (ref 135–145)

## 2022-10-17 LAB — CBC WITH DIFFERENTIAL/PLATELET
Abs Immature Granulocytes: 0.02 10*3/uL (ref 0.00–0.07)
Basophils Absolute: 0 10*3/uL (ref 0.0–0.1)
Basophils Relative: 1 %
Eosinophils Absolute: 0.4 10*3/uL (ref 0.0–0.5)
Eosinophils Relative: 9 %
HCT: 37.6 % — ABNORMAL LOW (ref 39.0–52.0)
Hemoglobin: 13.1 g/dL (ref 13.0–17.0)
Immature Granulocytes: 1 %
Lymphocytes Relative: 9 %
Lymphs Abs: 0.4 10*3/uL — ABNORMAL LOW (ref 0.7–4.0)
MCH: 32.3 pg (ref 26.0–34.0)
MCHC: 34.8 g/dL (ref 30.0–36.0)
MCV: 92.6 fL (ref 80.0–100.0)
Monocytes Absolute: 0.5 10*3/uL (ref 0.1–1.0)
Monocytes Relative: 11 %
Neutro Abs: 3.1 10*3/uL (ref 1.7–7.7)
Neutrophils Relative %: 69 %
Platelets: 163 10*3/uL (ref 150–400)
RBC: 4.06 MIL/uL — ABNORMAL LOW (ref 4.22–5.81)
RDW: 13.3 % (ref 11.5–15.5)
WBC: 4.4 10*3/uL (ref 4.0–10.5)
nRBC: 0 % (ref 0.0–0.2)

## 2022-10-17 LAB — URINALYSIS, W/ REFLEX TO CULTURE (INFECTION SUSPECTED)
Bilirubin Urine: NEGATIVE
Glucose, UA: NEGATIVE mg/dL
Ketones, ur: NEGATIVE mg/dL
Leukocytes,Ua: NEGATIVE
Nitrite: NEGATIVE
Protein, ur: NEGATIVE mg/dL
Specific Gravity, Urine: >=1.03 (ref 1.005–1.030)
pH: 5.5 (ref 5.0–8.0)

## 2022-10-17 MED ORDER — LIDOCAINE HCL URETHRAL/MUCOSAL 2 % EX GEL
1.0000 | Freq: Once | CUTANEOUS | Status: AC
  Administered 2022-10-17: 1 via URETHRAL
  Filled 2022-10-17: qty 11

## 2022-10-17 MED ORDER — LORAZEPAM 1 MG PO TABS
1.0000 mg | ORAL_TABLET | Freq: Once | ORAL | Status: AC
  Administered 2022-10-17: 1 mg via ORAL
  Filled 2022-10-17: qty 1

## 2022-10-17 NOTE — Discharge Instructions (Addendum)
All your blood work today looks normal.  Concerned that you may be constipated.  Start increasing the amount of liquids you drink.  You can also use 1 capful of MiraLAX in 6 ounces of fluid every 6 hours until you have a good bowel movement.  You start having vomiting, fever, confusion, severe abdominal pain or other concerns return to the emergency room.

## 2022-10-17 NOTE — ED Triage Notes (Signed)
The patient stated the last time he was able to urinate was yesterday morning. He has some prostate cancer.

## 2022-10-17 NOTE — ED Notes (Signed)
Discharge paperwork reviewed entirely with patient, including follow up care. Pain was under control. No prescriptions were called in, but all questions were addressed.  Pt verbalized understanding as well as all parties involved. No questions or concerns voiced at the time of discharge. No acute distress noted.   Pt ambulated out to PVA without incident or assistance.  

## 2022-10-17 NOTE — ED Provider Notes (Signed)
[name] Provider Note   CSN: [id number] Arrival date & time: 10/17/22  1011     History  Chief Complaint  Patient presents with   Urinary Retention    David [name] is a 77 y.o. male.  Pt is a 77 year old with a history of hypertension, CHF on eliquis, and BPH, is currently undergoing radiation treatment for prostate cancer presenting today with c/o of urinary retention.  Patient reports he got his dose of radiation yesterday and he started noticing some difficulty urinating.  He reports similar symptoms last week but he got home and was eventually able to urinate and did well throughout the week until yesterday.  He last urinated at 9 PM last night and has not been able to urinate since.  He has a pressure and pain sensation in his penis and lower pelvis.  He has not noticed any blood in his urine or stool.  Last bowel movement was a few days ago and reports that they are small but he does continue to pass stool.  He has not had any nausea or vomiting.  The history is provided by the patient and medical records.       Home Medications Prior to Admission medications   Medication Sig Start Date End Date Taking? Authorizing Provider  acetaminophen (TYLENOL) 325 MG tablet Take 2 tablets (650 mg total) by mouth every 6 (six) hours as needed. 08/13/22   Sloan Leiter, DO  allopurinol (ZYLOPRIM) 100 MG tablet Take 0.5 tablets (50 mg total) by mouth daily. 06/03/22   [name], MD  ascorbic acid (VITAMIN C) 1000 MG tablet Take by mouth.    [provider]  atenolol (TENORMIN) 50 MG tablet Take 1 tablet (50 mg total) by mouth 2 (two) times daily. 07/17/22   [name], MD  atorvastatin (LIPITOR) 10 MG tablet TAKE ONE TABLET BY MOUTH EVERY MORNING 03/25/22   [name], MD  Cholecalciferol (VITAMIN D-1000 MAX ST) 25 MCG (1000 UT) tablet Take by mouth.    [provider]  clotrimazole-betamethasone (LOTRISONE)  lotion Apply topically 2 (two) times daily. For 2 weeks. Patient taking differently: Apply topically daily. For 2 weeks. 08/19/21   [name], MD  colchicine 0.6 MG tablet For gout flares, take 1 tab po once then repeat in 1 hour as needed if pain persists up to a total of 3 doses 05/20/20   [name], MD  ELIQUIS 5 MG TABS tablet TAKE 1 TABLET BY MOUTH TWICE A DAY 04/13/22   [name], MD  fluconazole (DIFLUCAN) 150 MG tablet 1 tab po monthly and prn dermatitis 03/10/22   [name], MD  FLUoxetine (PROZAC) 10 MG tablet Take 2 tablets (20 mg total) by mouth at bedtime. 09/28/22   [name], MD  fluticasone (FLONASE) 50 MCG/ACT nasal spray Place 2 sprays into both nostrils daily. 06/29/22   Saguier, Ramon Dredge, PA-C  ketoconazole (NIZORAL) 2 % cream Apply 1 Application topically daily. 03/10/22   [name], MD  lidocaine (LIDODERM) 5 % Place 1 patch onto the skin daily as needed. Remove & Discard patch within 12 hours or as directed by MD Patient not taking: Reported on 10/07/2022 08/13/22   Tanda Rockers A, DO  LORazepam (ATIVAN) 1 MG tablet TAKE ONE TABLET BY MOUTH EVERY 8 HOURS AS NEEDED FOR ANXIETY 08/19/22   [name], MD  omeprazole (PRILOSEC) 20 MG capsule Take 20 mg  by mouth daily as needed (acid reflux).     [provider]  tiZANidine (ZANAFLEX) 2 MG tablet Take 0.5-2 tablets (1-4 mg total) by mouth every 8 (eight) hours as needed for muscle spasms (joint pain). Patient not taking: Reported on 10/07/2022 07/09/22   [name], MD  triamcinolone (KENALOG) 0.025 % cream SMARTSIG:1 Topical Daily Patient not taking: Reported on 10/07/2022 12/24/21   [provider]      Allergies    Cefaclor, Cephalosporins, and Penicillins    Review of Systems   Review of Systems  Physical Exam Updated Vital Signs BP 118/65   Pulse 62   Temp 97.7 F (36.5 C) (Oral)   Resp 18   Ht 6\' 1"  (1.854 m)   Wt 105 kg   SpO2 97%   BMI 30.54 kg/m  Physical  Exam Vitals and nursing note reviewed.  Constitutional:      General: He is not in acute distress.    Appearance: He is well-developed.  HENT:     Head: Normocephalic and atraumatic.  Eyes:     Conjunctiva/sclera: Conjunctivae normal.     Pupils: Pupils are equal, round, and reactive to light.  Cardiovascular:     Rate and Rhythm: Normal rate and regular rhythm.     Heart sounds: No murmur heard. Pulmonary:     Effort: Pulmonary effort is normal. No respiratory distress.     Breath sounds: Normal breath sounds. No wheezing or rales.  Abdominal:     General: There is no distension.     Palpations: Abdomen is soft.     Tenderness: There is abdominal tenderness in the suprapubic area. There is no guarding or rebound.     Comments: Mild tenderness in the suprapubic area  Genitourinary:    Penis: Normal.   Musculoskeletal:        General: No tenderness. Normal range of motion.     Cervical back: Normal range of motion and neck supple.  Skin:    General: Skin is warm and dry.     Findings: No erythema or rash.  Neurological:     Mental Status: He is alert and oriented to person, place, and time.  Psychiatric:        Behavior: Behavior normal.     ED Results / Procedures / Treatments   Labs (all labs ordered are listed, but only abnormal results are displayed) Labs Reviewed  URINALYSIS, W/ REFLEX TO CULTURE (INFECTION SUSPECTED) - Abnormal; Notable for the following components:      Result Value   Hgb urine dipstick SMALL (*)    Bacteria, UA FEW (*)    All other components within normal limits  CBC WITH DIFFERENTIAL/PLATELET - Abnormal; Notable for the following components:   RBC 4.06 (*)    HCT 37.6 (*)    Lymphs Abs 0.4 (*)    All other components within normal limits  BASIC METABOLIC PANEL - Abnormal; Notable for the following components:   Glucose, Bld 104 (*)    Creatinine, Ser 1.61 (*)    GFR, Estimated 44 (*)    All other components within normal limits     EKG None  Radiology No results found.  Procedures Procedures    Medications Ordered in ED Medications  lidocaine (XYLOCAINE) 2 % jelly 1 Application (1 Application Urethral Given 10/17/22 1104)  LORazepam (ATIVAN) tablet 1 mg (1 mg Oral Given 10/17/22 1104)    ED Course/ Medical Decision Making/ A&P  Medical Decision Making Amount and/or Complexity of Data Reviewed Labs: ordered. Decision-making details documented in ED Course.  Risk Prescription drug management.   Pt with multiple medical problems and comorbidities and presenting today with a complaint that caries a high risk for morbidity and mortality.  Here today with complaints of urinary retention.  Patient currently getting radiation to the prostate for cancer but denying any symptoms concerning for hematuria.  Urinary retention could be related to recent radiation versus also concern for possible constipation as patient has been having bowel movements but reports they are small and hard.  Bedside ultrasound with a lot of bowel gas present but concern for urinary retention.  Foley catheter was placed.  1:01 PM Minimal urinary outpt with cath and it was removed.  I independently interpreted patient's labs and CBC without acute findings, BMP with improved creatinine now at 1.6 from 1.8 and UA without acute findings.  On repeat exam a rectal exam was done without evidence of fecal impaction.  However when talking with the patient's wife she reports he does not drink much fluid and has a poor diet.  He does admit to feeling constipated but does not take anything regularly for this.  At this point with normal labs for him, no evidence of urinary retention feel that he is stable for discharge home.  He has no significant abdominal pain that would suggest an acute abdominal process such as bowel perforation, diverticulitis or kidney stones.  Findings discussed with the patient and his wife.  He will  start MiraLAX and increase his liquid intake.  He was given return precautions.  It was documented that patient's pulse was 140 even though initial evaluation showed a heart rate of 68.  On recheck hr is 63.         Final Clinical Impression(s) / ED Diagnoses Final diagnoses:  Dysuria  Constipation, unspecified constipation type    Rx / DC Orders ED Discharge Orders     None         Gwyneth Sprout, MD 10/17/22 1302

## 2022-10-19 ENCOUNTER — Other Ambulatory Visit: Payer: Self-pay

## 2022-10-19 ENCOUNTER — Ambulatory Visit
Admission: RE | Admit: 2022-10-19 | Discharge: 2022-10-19 | Disposition: A | Discharge status: Home / Self Care | Payer: Medicare Other | Source: Ambulatory Visit | Attending: Radiation Oncology | Admitting: Radiation Oncology

## 2022-10-19 ENCOUNTER — Ambulatory Visit: Payer: Medicare Other

## 2022-10-19 DIAGNOSIS — C61 Malignant neoplasm of prostate: Secondary | ICD-10-CM | POA: Diagnosis not present

## 2022-10-19 DIAGNOSIS — R300 Dysuria: Secondary | ICD-10-CM | POA: Diagnosis not present

## 2022-10-19 DIAGNOSIS — Z510 Encounter for antineoplastic radiation therapy: Secondary | ICD-10-CM | POA: Diagnosis not present

## 2022-10-19 LAB — RAD ONC ARIA SESSION SUMMARY
Course Elapsed Days: 34
Plan Fractions Treated to Date: 25
Plan Prescribed Dose Per Fraction: 1.8 Gy
Plan Total Fractions Prescribed: 25
Plan Total Prescribed Dose: 45 Gy
Reference Point Dosage Given to Date: 45 Gy
Reference Point Session Dosage Given: 1.8 Gy
Session Number: 25

## 2022-10-19 NOTE — Progress Notes (Signed)
David [name] prostate patient had complaints of not emptying bladder completely. Went to the [facility] Emergency department for dysuria and urinary retention on 10/17/2022. He's here for treatment and said he's unable to urinate it feels like he couldn't emptying bladder. They did a UA results negative for UTI. He is to come around after treatment for [place] to catheterized to urine retention. David [name] reports he did urinate in the restroom after treatment so he didn't want to be catheterized in clinic.  He stated he had some mild discomfort to the left side of his abdomen may be related to constipation.  Denies blood in urine or stools.He reports having BM couple days ago that he's not having any issues there.  He was pleasant when talking with staff and appreciate everyone's help grabbed his coat and left the clinic.  RN advised patient to go to ED if having any fever, chills, urine retention, or bight red bleed from penis or rectum.  David Faster, PA-C made aware or patient concerns .  Vitals: 97.4-63-18-124/71 O2 sat 100%.

## 2022-10-20 ENCOUNTER — Ambulatory Visit
Admission: RE | Admit: 2022-10-20 | Discharge: 2022-10-20 | Disposition: A | Discharge status: Home / Self Care | Payer: Medicare Other | Source: Ambulatory Visit | Attending: Radiation Oncology

## 2022-10-20 ENCOUNTER — Other Ambulatory Visit: Payer: Self-pay

## 2022-10-20 DIAGNOSIS — Z510 Encounter for antineoplastic radiation therapy: Secondary | ICD-10-CM | POA: Diagnosis not present

## 2022-10-20 DIAGNOSIS — C61 Malignant neoplasm of prostate: Secondary | ICD-10-CM | POA: Diagnosis not present

## 2022-10-20 DIAGNOSIS — R300 Dysuria: Secondary | ICD-10-CM | POA: Diagnosis not present

## 2022-10-20 LAB — RAD ONC ARIA SESSION SUMMARY
Course Elapsed Days: 35
Plan Fractions Treated to Date: 1
Plan Prescribed Dose Per Fraction: 2 Gy
Plan Total Fractions Prescribed: 15
Plan Total Prescribed Dose: 30 Gy
Reference Point Dosage Given to Date: 2 Gy
Reference Point Session Dosage Given: 2 Gy
Session Number: 26

## 2022-10-21 ENCOUNTER — Ambulatory Visit
Admission: RE | Admit: 2022-10-21 | Discharge: 2022-10-21 | Disposition: A | Discharge status: Home / Self Care | Payer: Medicare Other | Source: Ambulatory Visit | Attending: Radiation Oncology | Admitting: Radiation Oncology

## 2022-10-21 ENCOUNTER — Other Ambulatory Visit: Payer: Self-pay

## 2022-10-21 DIAGNOSIS — R300 Dysuria: Secondary | ICD-10-CM | POA: Diagnosis not present

## 2022-10-21 DIAGNOSIS — Z510 Encounter for antineoplastic radiation therapy: Secondary | ICD-10-CM | POA: Diagnosis not present

## 2022-10-21 DIAGNOSIS — C61 Malignant neoplasm of prostate: Secondary | ICD-10-CM | POA: Diagnosis not present

## 2022-10-21 LAB — RAD ONC ARIA SESSION SUMMARY
Course Elapsed Days: 36
Plan Fractions Treated to Date: 2
Plan Prescribed Dose Per Fraction: 2 Gy
Plan Total Fractions Prescribed: 15
Plan Total Prescribed Dose: 30 Gy
Reference Point Dosage Given to Date: 4 Gy
Reference Point Session Dosage Given: 2 Gy
Session Number: 27

## 2022-10-22 ENCOUNTER — Ambulatory Visit
Admission: RE | Admit: 2022-10-22 | Discharge: 2022-10-22 | Disposition: A | Discharge status: Home / Self Care | Payer: Medicare Other | Source: Ambulatory Visit | Attending: Radiation Oncology | Admitting: Radiation Oncology

## 2022-10-22 ENCOUNTER — Other Ambulatory Visit: Payer: Self-pay

## 2022-10-22 DIAGNOSIS — R300 Dysuria: Secondary | ICD-10-CM | POA: Diagnosis not present

## 2022-10-22 DIAGNOSIS — C61 Malignant neoplasm of prostate: Secondary | ICD-10-CM | POA: Diagnosis not present

## 2022-10-22 DIAGNOSIS — Z510 Encounter for antineoplastic radiation therapy: Secondary | ICD-10-CM | POA: Diagnosis not present

## 2022-10-22 LAB — RAD ONC ARIA SESSION SUMMARY
Course Elapsed Days: 37
Plan Fractions Treated to Date: 3
Plan Prescribed Dose Per Fraction: 2 Gy
Plan Total Fractions Prescribed: 15
Plan Total Prescribed Dose: 30 Gy
Reference Point Dosage Given to Date: 6 Gy
Reference Point Session Dosage Given: 2 Gy
Session Number: 28

## 2022-10-23 ENCOUNTER — Other Ambulatory Visit: Payer: Self-pay

## 2022-10-23 ENCOUNTER — Ambulatory Visit
Admission: RE | Admit: 2022-10-23 | Discharge: 2022-10-23 | Disposition: A | Discharge status: Home / Self Care | Payer: Medicare Other | Source: Ambulatory Visit | Attending: Radiation Oncology | Admitting: Radiation Oncology

## 2022-10-23 DIAGNOSIS — R300 Dysuria: Secondary | ICD-10-CM | POA: Diagnosis not present

## 2022-10-23 DIAGNOSIS — Z510 Encounter for antineoplastic radiation therapy: Secondary | ICD-10-CM | POA: Diagnosis not present

## 2022-10-23 DIAGNOSIS — C61 Malignant neoplasm of prostate: Secondary | ICD-10-CM | POA: Diagnosis not present

## 2022-10-23 LAB — RAD ONC ARIA SESSION SUMMARY
Course Elapsed Days: 38
Plan Fractions Treated to Date: 4
Plan Prescribed Dose Per Fraction: 2 Gy
Plan Total Fractions Prescribed: 15
Plan Total Prescribed Dose: 30 Gy
Reference Point Dosage Given to Date: 8 Gy
Reference Point Session Dosage Given: 2 Gy
Session Number: 29

## 2022-10-26 ENCOUNTER — Ambulatory Visit
Admission: RE | Admit: 2022-10-26 | Discharge: 2022-10-26 | Disposition: A | Discharge status: Home / Self Care | Payer: Medicare Other | Source: Ambulatory Visit | Attending: Radiation Oncology

## 2022-10-26 ENCOUNTER — Other Ambulatory Visit: Payer: Self-pay | Admitting: Family Medicine

## 2022-10-26 ENCOUNTER — Ambulatory Visit: Payer: Medicare Other | Admitting: Internal Medicine

## 2022-10-26 ENCOUNTER — Other Ambulatory Visit (INDEPENDENT_AMBULATORY_CARE_PROVIDER_SITE_OTHER): Payer: Medicare Other

## 2022-10-26 ENCOUNTER — Other Ambulatory Visit: Payer: Self-pay

## 2022-10-26 DIAGNOSIS — I10 Essential (primary) hypertension: Secondary | ICD-10-CM

## 2022-10-26 DIAGNOSIS — C61 Malignant neoplasm of prostate: Secondary | ICD-10-CM | POA: Diagnosis not present

## 2022-10-26 DIAGNOSIS — E782 Mixed hyperlipidemia: Secondary | ICD-10-CM | POA: Diagnosis not present

## 2022-10-26 DIAGNOSIS — Z510 Encounter for antineoplastic radiation therapy: Secondary | ICD-10-CM | POA: Diagnosis not present

## 2022-10-26 DIAGNOSIS — R300 Dysuria: Secondary | ICD-10-CM | POA: Diagnosis not present

## 2022-10-26 DIAGNOSIS — R739 Hyperglycemia, unspecified: Secondary | ICD-10-CM | POA: Diagnosis not present

## 2022-10-26 LAB — COMPREHENSIVE METABOLIC PANEL
ALT: 26 U/L (ref 0–53)
AST: 23 U/L (ref 0–37)
Albumin: 3.5 g/dL (ref 3.5–5.2)
Alkaline Phosphatase: 80 U/L (ref 39–117)
BUN: 15 mg/dL (ref 6–23)
CO2: 26 meq/L (ref 19–32)
Calcium: 9.7 mg/dL (ref 8.4–10.5)
Chloride: 109 meq/L (ref 96–112)
Creatinine, Ser: 1.59 mg/dL — ABNORMAL HIGH (ref 0.40–1.50)
GFR: 41.63 mL/min — ABNORMAL LOW (ref >60.00)
Glucose, Bld: 113 mg/dL — ABNORMAL HIGH (ref 70–99)
Potassium: 3.7 meq/L (ref 3.5–5.1)
Sodium: 144 meq/L (ref 135–145)
Total Bilirubin: 0.4 mg/dL (ref 0.2–1.2)
Total Protein: 5.6 g/dL — ABNORMAL LOW (ref 6.0–8.3)

## 2022-10-26 LAB — LIPID PANEL
Cholesterol: 112 mg/dL (ref 0–200)
HDL: 35.8 mg/dL — ABNORMAL LOW (ref >39.00)
LDL Cholesterol: 36 mg/dL (ref 0–99)
NonHDL: 75.74
Total CHOL/HDL Ratio: 3
Triglycerides: 201 mg/dL — ABNORMAL HIGH (ref 0.0–149.0)
VLDL: 40.2 mg/dL — ABNORMAL HIGH (ref 0.0–40.0)

## 2022-10-26 LAB — RAD ONC ARIA SESSION SUMMARY
Course Elapsed Days: 41
Plan Fractions Treated to Date: 5
Plan Prescribed Dose Per Fraction: 2 Gy
Plan Total Fractions Prescribed: 15
Plan Total Prescribed Dose: 30 Gy
Reference Point Dosage Given to Date: 10 Gy
Reference Point Session Dosage Given: 2 Gy
Session Number: 30

## 2022-10-26 LAB — TSH: TSH: 4.16 u[IU]/mL (ref 0.35–5.50)

## 2022-10-26 LAB — HEMOGLOBIN A1C: Hgb A1c MFr Bld: 5.9 % (ref 4.6–6.5)

## 2022-10-26 LAB — CBC WITH DIFFERENTIAL/PLATELET
Basophils Absolute: 0 10*3/uL (ref 0.0–0.1)
Basophils Relative: 0.6 % (ref 0.0–3.0)
Eosinophils Absolute: 0.3 10*3/uL (ref 0.0–0.7)
Eosinophils Relative: 9.2 % — ABNORMAL HIGH (ref 0.0–5.0)
HCT: 37.1 % — ABNORMAL LOW (ref 39.0–52.0)
Hemoglobin: 12.5 g/dL — ABNORMAL LOW (ref 13.0–17.0)
Lymphocytes Relative: 11.2 % — ABNORMAL LOW (ref 12.0–46.0)
Lymphs Abs: 0.4 10*3/uL — ABNORMAL LOW (ref 0.7–4.0)
MCHC: 33.8 g/dL (ref 30.0–36.0)
MCV: 95.4 fL (ref 78.0–100.0)
Monocytes Absolute: 0.4 10*3/uL (ref 0.1–1.0)
Monocytes Relative: 11.3 % (ref 3.0–12.0)
Neutro Abs: 2.4 10*3/uL (ref 1.4–7.7)
Neutrophils Relative %: 67.7 % (ref 43.0–77.0)
Platelets: 201 10*3/uL (ref 150.0–400.0)
RBC: 3.89 Mil/uL — ABNORMAL LOW (ref 4.22–5.81)
RDW: 14.9 % (ref 11.5–15.5)
WBC: 3.6 10*3/uL — ABNORMAL LOW (ref 4.0–10.5)

## 2022-10-26 LAB — T4, FREE: Free T4: 0.92 ng/dL (ref 0.60–1.60)

## 2022-10-26 NOTE — Progress Notes (Addendum)
David [name] came into clinic this morning with complaints of upset stomach, gassy, and explosive diarrhea over the weekend.  He reports went to doctor appointment on 10/23/2022 with Dr. Mena Pauls office and later that evening stomach was feeling bad at first unable to have bowel movement and urinate but as time went on he later had a massive explosion of diarrhea.  He did urinate as he was sitting on the commode. Small tinge of blood noted when he wiped.  Felt nauseous no vomiting at the time.  Felt some discomfort to groin area.  Wanted to know if he had to have radiation treatment today.  RN reminded patient of the education given at beginning of treatment.  Advised that 10-14 days into treatment he will experience nausea/vomiting and diarrhea.  He is 4 weeks into treatment and this is the first time experiencing side effects.  RN advised that the patient drink plenty of water and or Gatorade to help replenish the fluids/electrolytes, preparation-H for rectal discomfort or bleeding, and Imodium AD lost from diarrhea.  David [name] stated he has some Pepto-bismol at home and will take some, that he will get some Gatorade to drink, he's currently wearing mens Depend's for any accidents.  He was appreciative of the talk with the nurse and says he will be back at 4 pm for scheduled treatment time he didn't want to stay to be treated earlier.  Will follow up with him later today.

## 2022-10-27 ENCOUNTER — Other Ambulatory Visit: Payer: Self-pay

## 2022-10-27 ENCOUNTER — Ambulatory Visit
Admission: RE | Admit: 2022-10-27 | Discharge: 2022-10-27 | Disposition: A | Discharge status: Home / Self Care | Payer: Medicare Other | Source: Ambulatory Visit | Attending: Radiation Oncology

## 2022-10-27 DIAGNOSIS — C61 Malignant neoplasm of prostate: Secondary | ICD-10-CM | POA: Diagnosis not present

## 2022-10-27 DIAGNOSIS — R300 Dysuria: Secondary | ICD-10-CM | POA: Diagnosis not present

## 2022-10-27 DIAGNOSIS — D649 Anemia, unspecified: Secondary | ICD-10-CM

## 2022-10-27 DIAGNOSIS — Z510 Encounter for antineoplastic radiation therapy: Secondary | ICD-10-CM | POA: Diagnosis not present

## 2022-10-27 DIAGNOSIS — I10 Essential (primary) hypertension: Secondary | ICD-10-CM

## 2022-10-27 LAB — RAD ONC ARIA SESSION SUMMARY
Course Elapsed Days: 42
Plan Fractions Treated to Date: 6
Plan Prescribed Dose Per Fraction: 2 Gy
Plan Total Fractions Prescribed: 15
Plan Total Prescribed Dose: 30 Gy
Reference Point Dosage Given to Date: 12 Gy
Reference Point Session Dosage Given: 2 Gy
Session Number: 31

## 2022-10-28 ENCOUNTER — Ambulatory Visit
Admission: RE | Admit: 2022-10-28 | Discharge: 2022-10-28 | Disposition: A | Discharge status: Home / Self Care | Payer: Medicare Other | Source: Ambulatory Visit | Attending: Radiation Oncology

## 2022-10-28 ENCOUNTER — Other Ambulatory Visit: Payer: Self-pay

## 2022-10-28 DIAGNOSIS — C61 Malignant neoplasm of prostate: Secondary | ICD-10-CM | POA: Diagnosis not present

## 2022-10-28 DIAGNOSIS — R300 Dysuria: Secondary | ICD-10-CM | POA: Diagnosis not present

## 2022-10-28 DIAGNOSIS — Z510 Encounter for antineoplastic radiation therapy: Secondary | ICD-10-CM | POA: Diagnosis not present

## 2022-10-28 LAB — RAD ONC ARIA SESSION SUMMARY
Course Elapsed Days: 43
Plan Fractions Treated to Date: 7
Plan Prescribed Dose Per Fraction: 2 Gy
Plan Total Fractions Prescribed: 15
Plan Total Prescribed Dose: 30 Gy
Reference Point Dosage Given to Date: 14 Gy
Reference Point Session Dosage Given: 2 Gy
Session Number: 32

## 2022-10-28 NOTE — Progress Notes (Signed)
[name] in to clinic with diarrhea, rectal discomfort, dysuria, and frequent urination.  He reports has been taking Tamsulosin daily, Pepto-Bismol off/on to no effect per patient, and using preparation-H & Vaseline for rectal irritation.  RN advised to use Imodium AD can take up to 8 tablets in 24 hour period, AZO with the Tamsulosin will help the dysuria, increase water intake, and continue using the preparation-H.  Advise to sit his bottom in warm water to help alleviate rectal discomfort, men's incontinent briefs, and to wipe with baby wipes.  [name] was given a list of OTC items to pick up at drug store.  [name] was given couple of incontinent briefs while at clinic to use if needed.  He verbalized understanding and took briefs with him.  RN walked patient over to treatment dressing room to prepare for his treatment.  [name] was very polite and appreciative of the conversation.

## 2022-10-29 ENCOUNTER — Ambulatory Visit
Admission: RE | Admit: 2022-10-29 | Discharge: 2022-10-29 | Disposition: A | Discharge status: Home / Self Care | Payer: Medicare Other | Source: Ambulatory Visit | Attending: Radiation Oncology | Admitting: Radiation Oncology

## 2022-10-29 ENCOUNTER — Other Ambulatory Visit: Payer: Self-pay

## 2022-10-29 DIAGNOSIS — C61 Malignant neoplasm of prostate: Secondary | ICD-10-CM | POA: Diagnosis not present

## 2022-10-29 DIAGNOSIS — R300 Dysuria: Secondary | ICD-10-CM | POA: Diagnosis not present

## 2022-10-29 DIAGNOSIS — Z510 Encounter for antineoplastic radiation therapy: Secondary | ICD-10-CM | POA: Diagnosis not present

## 2022-10-29 LAB — RAD ONC ARIA SESSION SUMMARY
Course Elapsed Days: 44
Plan Fractions Treated to Date: 8
Plan Prescribed Dose Per Fraction: 2 Gy
Plan Total Fractions Prescribed: 15
Plan Total Prescribed Dose: 30 Gy
Reference Point Dosage Given to Date: 16 Gy
Reference Point Session Dosage Given: 2 Gy
Session Number: 33

## 2022-10-30 ENCOUNTER — Ambulatory Visit
Admission: RE | Admit: 2022-10-30 | Discharge: 2022-10-30 | Disposition: A | Discharge status: Home / Self Care | Payer: Medicare Other | Source: Ambulatory Visit | Attending: Radiation Oncology | Admitting: Radiation Oncology

## 2022-10-30 ENCOUNTER — Other Ambulatory Visit: Payer: Self-pay

## 2022-10-30 DIAGNOSIS — R300 Dysuria: Secondary | ICD-10-CM | POA: Diagnosis not present

## 2022-10-30 DIAGNOSIS — Z510 Encounter for antineoplastic radiation therapy: Secondary | ICD-10-CM | POA: Diagnosis not present

## 2022-10-30 DIAGNOSIS — C61 Malignant neoplasm of prostate: Secondary | ICD-10-CM | POA: Diagnosis not present

## 2022-10-30 LAB — RAD ONC ARIA SESSION SUMMARY
Course Elapsed Days: 45
Plan Fractions Treated to Date: 9
Plan Prescribed Dose Per Fraction: 2 Gy
Plan Total Fractions Prescribed: 15
Plan Total Prescribed Dose: 30 Gy
Reference Point Dosage Given to Date: 18 Gy
Reference Point Session Dosage Given: 2 Gy
Session Number: 34

## 2022-11-02 ENCOUNTER — Other Ambulatory Visit: Payer: Self-pay

## 2022-11-02 ENCOUNTER — Ambulatory Visit
Admission: RE | Admit: 2022-11-02 | Discharge: 2022-11-02 | Disposition: A | Discharge status: Home / Self Care | Payer: Medicare Other | Source: Ambulatory Visit | Attending: Radiation Oncology

## 2022-11-02 DIAGNOSIS — Z510 Encounter for antineoplastic radiation therapy: Secondary | ICD-10-CM | POA: Diagnosis not present

## 2022-11-02 DIAGNOSIS — R300 Dysuria: Secondary | ICD-10-CM | POA: Diagnosis not present

## 2022-11-02 DIAGNOSIS — C61 Malignant neoplasm of prostate: Secondary | ICD-10-CM | POA: Diagnosis not present

## 2022-11-02 LAB — RAD ONC ARIA SESSION SUMMARY
Course Elapsed Days: 48
Plan Fractions Treated to Date: 10
Plan Prescribed Dose Per Fraction: 2 Gy
Plan Total Fractions Prescribed: 15
Plan Total Prescribed Dose: 30 Gy
Reference Point Dosage Given to Date: 20 Gy
Reference Point Session Dosage Given: 2 Gy
Session Number: 35

## 2022-11-03 ENCOUNTER — Other Ambulatory Visit: Payer: Self-pay

## 2022-11-03 ENCOUNTER — Ambulatory Visit
Admission: RE | Admit: 2022-11-03 | Discharge: 2022-11-03 | Disposition: A | Discharge status: Home / Self Care | Payer: Medicare Other | Source: Ambulatory Visit | Attending: Radiation Oncology | Admitting: Radiation Oncology

## 2022-11-03 DIAGNOSIS — C61 Malignant neoplasm of prostate: Secondary | ICD-10-CM | POA: Diagnosis not present

## 2022-11-03 DIAGNOSIS — Z510 Encounter for antineoplastic radiation therapy: Secondary | ICD-10-CM | POA: Diagnosis not present

## 2022-11-03 DIAGNOSIS — R300 Dysuria: Secondary | ICD-10-CM | POA: Diagnosis not present

## 2022-11-03 LAB — RAD ONC ARIA SESSION SUMMARY
Course Elapsed Days: 49
Plan Fractions Treated to Date: 11
Plan Prescribed Dose Per Fraction: 2 Gy
Plan Total Fractions Prescribed: 15
Plan Total Prescribed Dose: 30 Gy
Reference Point Dosage Given to Date: 22 Gy
Reference Point Session Dosage Given: 2 Gy
Session Number: 36

## 2022-11-04 ENCOUNTER — Ambulatory Visit
Admission: RE | Admit: 2022-11-04 | Discharge: 2022-11-04 | Discharge status: Home / Self Care | Payer: Medicare Other | Source: Ambulatory Visit | Attending: Radiation Oncology

## 2022-11-04 ENCOUNTER — Other Ambulatory Visit: Payer: Self-pay

## 2022-11-04 ENCOUNTER — Ambulatory Visit (HOSPITAL_BASED_OUTPATIENT_CLINIC_OR_DEPARTMENT_OTHER): Payer: Medicare Other

## 2022-11-04 DIAGNOSIS — Z510 Encounter for antineoplastic radiation therapy: Secondary | ICD-10-CM | POA: Diagnosis not present

## 2022-11-04 DIAGNOSIS — R300 Dysuria: Secondary | ICD-10-CM | POA: Diagnosis not present

## 2022-11-04 DIAGNOSIS — C61 Malignant neoplasm of prostate: Secondary | ICD-10-CM | POA: Diagnosis not present

## 2022-11-04 LAB — RAD ONC ARIA SESSION SUMMARY
Course Elapsed Days: 50
Plan Fractions Treated to Date: 12
Plan Prescribed Dose Per Fraction: 2 Gy
Plan Total Fractions Prescribed: 15
Plan Total Prescribed Dose: 30 Gy
Reference Point Dosage Given to Date: 24 Gy
Reference Point Session Dosage Given: 2 Gy
Session Number: 37

## 2022-11-05 ENCOUNTER — Ambulatory Visit
Admission: RE | Admit: 2022-11-05 | Discharge: 2022-11-05 | Disposition: A | Discharge status: Home / Self Care | Payer: Medicare Other | Source: Ambulatory Visit | Attending: Radiation Oncology | Admitting: Radiation Oncology

## 2022-11-05 ENCOUNTER — Other Ambulatory Visit: Payer: Self-pay

## 2022-11-05 DIAGNOSIS — R300 Dysuria: Secondary | ICD-10-CM | POA: Diagnosis not present

## 2022-11-05 DIAGNOSIS — Z510 Encounter for antineoplastic radiation therapy: Secondary | ICD-10-CM | POA: Diagnosis not present

## 2022-11-05 DIAGNOSIS — C61 Malignant neoplasm of prostate: Secondary | ICD-10-CM | POA: Diagnosis not present

## 2022-11-05 LAB — RAD ONC ARIA SESSION SUMMARY
Course Elapsed Days: 51
Plan Fractions Treated to Date: 13
Plan Prescribed Dose Per Fraction: 2 Gy
Plan Total Fractions Prescribed: 15
Plan Total Prescribed Dose: 30 Gy
Reference Point Dosage Given to Date: 26 Gy
Reference Point Session Dosage Given: 2 Gy
Session Number: 38

## 2022-11-06 ENCOUNTER — Ambulatory Visit
Admission: RE | Admit: 2022-11-06 | Discharge: 2022-11-06 | Disposition: A | Discharge status: Home / Self Care | Payer: Medicare Other | Source: Ambulatory Visit | Attending: Radiation Oncology

## 2022-11-06 ENCOUNTER — Ambulatory Visit
Admission: RE | Admit: 2022-11-06 | Discharge: 2022-11-06 | Disposition: A | Discharge status: Home / Self Care | Payer: Medicare Other | Source: Ambulatory Visit | Attending: Radiation Oncology | Admitting: Radiation Oncology

## 2022-11-06 ENCOUNTER — Other Ambulatory Visit: Payer: Self-pay

## 2022-11-06 DIAGNOSIS — R300 Dysuria: Secondary | ICD-10-CM | POA: Diagnosis not present

## 2022-11-06 DIAGNOSIS — Z510 Encounter for antineoplastic radiation therapy: Secondary | ICD-10-CM | POA: Diagnosis not present

## 2022-11-06 DIAGNOSIS — C61 Malignant neoplasm of prostate: Secondary | ICD-10-CM | POA: Diagnosis not present

## 2022-11-06 LAB — RAD ONC ARIA SESSION SUMMARY
Course Elapsed Days: 52
Plan Fractions Treated to Date: 14
Plan Prescribed Dose Per Fraction: 2 Gy
Plan Total Fractions Prescribed: 15
Plan Total Prescribed Dose: 30 Gy
Reference Point Dosage Given to Date: 28 Gy
Reference Point Session Dosage Given: 2 Gy
Session Number: 39

## 2022-11-08 ENCOUNTER — Encounter (HOSPITAL_BASED_OUTPATIENT_CLINIC_OR_DEPARTMENT_OTHER): Payer: Self-pay | Admitting: Emergency Medicine

## 2022-11-08 ENCOUNTER — Other Ambulatory Visit: Payer: Self-pay

## 2022-11-08 ENCOUNTER — Emergency Department (HOSPITAL_BASED_OUTPATIENT_CLINIC_OR_DEPARTMENT_OTHER): Payer: Medicare Other

## 2022-11-08 ENCOUNTER — Emergency Department (HOSPITAL_BASED_OUTPATIENT_CLINIC_OR_DEPARTMENT_OTHER)
Admission: EM | Admit: 2022-11-08 | Discharge: 2022-11-08 | Disposition: A | Discharge status: Home / Self Care | Payer: Medicare Other | Attending: Emergency Medicine | Admitting: Emergency Medicine

## 2022-11-08 ENCOUNTER — Other Ambulatory Visit: Payer: Self-pay | Admitting: Family Medicine

## 2022-11-08 DIAGNOSIS — K5900 Constipation, unspecified: Secondary | ICD-10-CM | POA: Diagnosis not present

## 2022-11-08 DIAGNOSIS — R339 Retention of urine, unspecified: Secondary | ICD-10-CM | POA: Insufficient documentation

## 2022-11-08 DIAGNOSIS — D61818 Other pancytopenia: Secondary | ICD-10-CM | POA: Diagnosis not present

## 2022-11-08 DIAGNOSIS — R110 Nausea: Secondary | ICD-10-CM | POA: Insufficient documentation

## 2022-11-08 DIAGNOSIS — Z7901 Long term (current) use of anticoagulants: Secondary | ICD-10-CM | POA: Insufficient documentation

## 2022-11-08 DIAGNOSIS — R350 Frequency of micturition: Secondary | ICD-10-CM | POA: Diagnosis not present

## 2022-11-08 DIAGNOSIS — K5790 Diverticulosis of intestine, part unspecified, without perforation or abscess without bleeding: Secondary | ICD-10-CM | POA: Diagnosis not present

## 2022-11-08 DIAGNOSIS — N200 Calculus of kidney: Secondary | ICD-10-CM | POA: Diagnosis not present

## 2022-11-08 DIAGNOSIS — R300 Dysuria: Secondary | ICD-10-CM | POA: Insufficient documentation

## 2022-11-08 DIAGNOSIS — R109 Unspecified abdominal pain: Secondary | ICD-10-CM | POA: Diagnosis not present

## 2022-11-08 LAB — CBC WITH DIFFERENTIAL/PLATELET
Abs Immature Granulocytes: 0.01 10*3/uL (ref 0.00–0.07)
Basophils Absolute: 0 10*3/uL (ref 0.0–0.1)
Basophils Relative: 1 %
Eosinophils Absolute: 0.1 10*3/uL (ref 0.0–0.5)
Eosinophils Relative: 5 %
HCT: 31.2 % — ABNORMAL LOW (ref 39.0–52.0)
Hemoglobin: 10.7 g/dL — ABNORMAL LOW (ref 13.0–17.0)
Immature Granulocytes: 0 %
Lymphocytes Relative: 13 %
Lymphs Abs: 0.4 10*3/uL — ABNORMAL LOW (ref 0.7–4.0)
MCH: 32.4 pg (ref 26.0–34.0)
MCHC: 34.3 g/dL (ref 30.0–36.0)
MCV: 94.5 fL (ref 80.0–100.0)
Monocytes Absolute: 0.3 10*3/uL (ref 0.1–1.0)
Monocytes Relative: 12 %
Neutro Abs: 2 10*3/uL (ref 1.7–7.7)
Neutrophils Relative %: 69 %
Platelets: 133 10*3/uL — ABNORMAL LOW (ref 150–400)
RBC: 3.3 MIL/uL — ABNORMAL LOW (ref 4.22–5.81)
RDW: 13.8 % (ref 11.5–15.5)
WBC: 2.9 10*3/uL — ABNORMAL LOW (ref 4.0–10.5)
nRBC: 0 % (ref 0.0–0.2)

## 2022-11-08 LAB — COMPREHENSIVE METABOLIC PANEL
ALT: 37 U/L (ref 0–44)
AST: 37 U/L (ref 15–41)
Albumin: 3 g/dL — ABNORMAL LOW (ref 3.5–5.0)
Alkaline Phosphatase: 76 U/L (ref 38–126)
Anion gap: 11 (ref 5–15)
BUN: 13 mg/dL (ref 8–23)
CO2: 26 mmol/L (ref 22–32)
Calcium: 9.4 mg/dL (ref 8.9–10.3)
Chloride: 101 mmol/L (ref 98–111)
Creatinine, Ser: 1.48 mg/dL — ABNORMAL HIGH (ref 0.61–1.24)
GFR, Estimated: 48 mL/min — ABNORMAL LOW (ref >60)
Glucose, Bld: 115 mg/dL — ABNORMAL HIGH (ref 70–99)
Potassium: 3.4 mmol/L — ABNORMAL LOW (ref 3.5–5.1)
Sodium: 138 mmol/L (ref 135–145)
Total Bilirubin: 0.7 mg/dL (ref 0.3–1.2)
Total Protein: 5.5 g/dL — ABNORMAL LOW (ref 6.5–8.1)

## 2022-11-08 LAB — URINALYSIS, W/ REFLEX TO CULTURE (INFECTION SUSPECTED)
Bacteria, UA: NONE SEEN
Bilirubin Urine: NEGATIVE
Glucose, UA: NEGATIVE mg/dL
Hgb urine dipstick: NEGATIVE
Ketones, ur: NEGATIVE mg/dL
Leukocytes,Ua: NEGATIVE
Nitrite: NEGATIVE
Protein, ur: NEGATIVE mg/dL
RBC / HPF: NONE SEEN RBC/hpf (ref 0–5)
Specific Gravity, Urine: 1.02 (ref 1.005–1.030)
pH: 6.5 (ref 5.0–8.0)

## 2022-11-08 NOTE — Discharge Instructions (Signed)
You can start taking miralax daily for constipation.

## 2022-11-08 NOTE — ED Provider Notes (Signed)
[place] [facility] AT [facility] Provider Note   CSN: [id number] Arrival date & time: 11/08/22  0136     History  Chief Complaint  Patient presents with   Urinary Retention   Constipation    David [name] is a 77 y.o. male.  The history is provided by the patient.  Constipation David [name] is a 77 y.o. male who presents to the Emergency Department complaining of patient and difficulty urinating.  He has a history of stage IV prostate cancer and is almost done with his radiation therapy.  He states that over the last 2 weeks he is having difficulty urinating and difficulty having bowel movements.  No bowel movement for the last 3 days.  In terms of his urination he is able to get about a tablespoon out every time he attempts to urinate.  He has pain in his bladder and burns of his urethral meatus.  No fever.  Occasional nausea.  No vomiting.  He is eating well.  No numbness or weakness in the legs.  No back pain.  He is on Eliquis for history of A-fib.  He is not taking anything for his constipation.  He is taking Flomax for his urinary difficulties.   Home Medications Prior to Admission medications   Medication Sig Start Date End Date Taking? Authorizing Provider  acetaminophen (TYLENOL) 325 MG tablet Take 2 tablets (650 mg total) by mouth every 6 (six) hours as needed. 08/13/22   Sloan Leiter, DO  allopurinol (ZYLOPRIM) 100 MG tablet Take 0.5 tablets (50 mg total) by mouth daily. 06/03/22   [name], MD  ascorbic acid (VITAMIN C) 1000 MG tablet Take by mouth.    [provider]  atenolol (TENORMIN) 50 MG tablet Take 1 tablet (50 mg total) by mouth 2 (two) times daily. 07/17/22   [name], MD  atorvastatin (LIPITOR) 10 MG tablet Take 1 tablet (10 mg total) by mouth every morning. 10/26/22   [name], MD  Cholecalciferol (VITAMIN D-1000 MAX ST) 25 MCG (1000 UT) tablet Take by mouth.    [provider]   clotrimazole-betamethasone (LOTRISONE) lotion Apply topically 2 (two) times daily. For 2 weeks. Patient taking differently: Apply topically daily. For 2 weeks. 08/19/21   [name], MD  colchicine 0.6 MG tablet For gout flares, take 1 tab po once then repeat in 1 hour as needed if pain persists up to a total of 3 doses 05/20/20   [name], MD  ELIQUIS 5 MG TABS tablet TAKE 1 TABLET BY MOUTH TWICE A DAY 04/13/22   [name], MD  fluconazole (DIFLUCAN) 150 MG tablet 1 tab po monthly and prn dermatitis 03/10/22   [name], MD  FLUoxetine (PROZAC) 10 MG tablet Take 2 tablets (20 mg total) by mouth at bedtime. 09/28/22   [name], MD  fluticasone (FLONASE) 50 MCG/ACT nasal spray Place 2 sprays into both nostrils daily. 06/29/22   Saguier, Ramon Dredge, PA-C  ketoconazole (NIZORAL) 2 % cream Apply 1 Application topically daily. 03/10/22   [name], MD  lidocaine (LIDODERM) 5 % Place 1 patch onto the skin daily as needed. Remove & Discard patch within 12 hours or as directed by MD Patient not taking: Reported on 10/07/2022 08/13/22   Tanda Rockers A, DO  LORazepam (ATIVAN) 1 MG tablet TAKE ONE TABLET BY MOUTH EVERY 8 HOURS AS NEEDED FOR ANXIETY 08/19/22   [name], MD  omeprazole (PRILOSEC) 20 MG capsule Take 20 mg by mouth daily as needed (acid reflux).     [provider]  tiZANidine (ZANAFLEX) 2 MG tablet Take 0.5-2 tablets (1-4 mg total) by mouth every 8 (eight) hours as needed for muscle spasms (joint pain). Patient not taking: Reported on 10/07/2022 07/09/22   [name], MD  triamcinolone (KENALOG) 0.025 % cream SMARTSIG:1 Topical Daily Patient not taking: Reported on 10/07/2022 12/24/21   [provider]      Allergies    Cefaclor, Cephalosporins, and Penicillins    Review of Systems   Review of Systems  Gastrointestinal:  Positive for constipation.  All other systems reviewed and are negative.   Physical Exam Updated Vital Signs BP 139/76  (BP Location: Right Arm)   Pulse 66   Temp 98.5 F (36.9 C) (Oral)   Resp 20   Ht 6\' 1"  (1.854 m)   Wt 104.3 kg   SpO2 97%   BMI 30.34 kg/m  Physical Exam Vitals and nursing note reviewed.  Constitutional:      Appearance: He is well-developed.  HENT:     Head: Normocephalic and atraumatic.  Cardiovascular:     Rate and Rhythm: Normal rate and regular rhythm.  Pulmonary:     Effort: Pulmonary effort is normal. No respiratory distress.  Abdominal:     Palpations: Abdomen is soft.     Tenderness: There is no abdominal tenderness. There is no guarding or rebound.  Genitourinary:    Comments: Normal rectal tone.  Small amount of stool in the rectal vault that is brown, no fecal impaction.  There is no erythema or edema at the glans or urethral meatus. Musculoskeletal:        General: No tenderness.  Skin:    General: Skin is warm and dry.  Neurological:     Mental Status: He is alert and oriented to person, place, and time.     Comments: 5 out of 5 strength in bilateral lower extremities with sensation to light touch intact in bilateral lower extremities  Psychiatric:        Behavior: Behavior normal.     ED Results / Procedures / Treatments   Labs (all labs ordered are listed, but only abnormal results are displayed) Labs Reviewed  COMPREHENSIVE METABOLIC PANEL - Abnormal; Notable for the following components:      Result Value   Potassium 3.4 (*)    Glucose, Bld 115 (*)    Creatinine, Ser 1.48 (*)    Total Protein 5.5 (*)    Albumin 3.0 (*)    GFR, Estimated 48 (*)    All other components within normal limits  CBC WITH DIFFERENTIAL/PLATELET - Abnormal; Notable for the following components:   WBC 2.9 (*)    RBC 3.30 (*)    Hemoglobin 10.7 (*)    HCT 31.2 (*)    Platelets 133 (*)    Lymphs Abs 0.4 (*)    All other components within normal limits  URINALYSIS, W/ REFLEX TO CULTURE (INFECTION SUSPECTED)    EKG None  Radiology CT Renal Stone Study  Result  Date: 11/08/2022 CLINICAL DATA:  Abdominal/flank pain.  Decreased urinary output. EXAM: CT ABDOMEN AND PELVIS WITHOUT CONTRAST TECHNIQUE: Multidetector CT imaging of the abdomen and pelvis was performed following the standard protocol without IV contrast. RADIATION DOSE REDUCTION: This exam was performed according to the departmental dose-optimization program which includes automated exposure control, adjustment of the mA and/or kV according to patient size  and/or use of iterative reconstruction technique. COMPARISON:  11/17/2021 FINDINGS: Lower Chest: Normal. Hepatobiliary: Normal hepatic contours. No intra- or extrahepatic biliary dilatation. Normal gallbladder. Pancreas: Normal pancreas. No ductal dilatation or peripancreatic fluid collection. Spleen: Normal. Adrenals/Urinary Tract: Unchanged appearance of 2.5 cm right adrenal nodule with attenuation of 28 HU. The right kidney is surgically absent. There are nonobstructive left renal calculi measuring 2 mm. No hydroureteronephrosis. Nondistended urinary bladder. Stomach/Bowel: There is no hiatal hernia. Normal duodenal course and caliber. No small bowel dilatation or inflammation. Distal sigmoid anastomosis. Rectosigmoid diverticulosis without acute inflammation. Normal appendix. Vascular/Lymphatic: There is calcific atherosclerosis of the abdominal aorta. No lymphadenopathy. Reproductive: Prostate seeds. Other: None. Musculoskeletal: Bilateral L5 pars interarticularis defects. Grade 1 retrolisthesis at L4-5 and grade 1 anterolisthesis at L5-S1. IMPRESSION: 1. No acute abnormality of the abdomen or pelvis. 2. Nonobstructive left renal calculi measuring 2 mm. 3. Unchanged appearance of 2.5 cm right adrenal nodule, likely an adenoma. 4. Rectosigmoid diverticulosis without acute inflammation. Aortic Atherosclerosis (ICD10-I70.0). Electronically Signed   By: Deatra Robinson M.D.   On: 11/08/2022 03:56    Procedures Procedures    Medications Ordered in  ED Medications - No data to display  ED Course/ Medical Decision Making/ A&P                                 Medical Decision Making Amount and/or Complexity of Data Reviewed Labs: ordered. Radiology: ordered.   Patient with history of advanced prostate cancer here for evaluation of decreased bowel movements, difficulty urinating.  Unable to visualize bladder on bladder scanner, bedside ultrasound with less than 100 cc in the bladder.  BMP with stable renal insufficiency.  UA is not consistent with UTI.  Given his symptoms a CT scan was obtained, which is negative for obstructing stone, mass or diverticulitis.  CBC with mild pancytopenia, likely secondary to radiation.  There is no evidence of infectious process at this time.  Suspect that his symptoms are secondary to radiation.  He has already started on Flomax.  Do not have additional treatments for his symptoms.  Discussed home care for constipation.  Plan to discharge home with outpatient follow-up as well as return precautions.        Final Clinical Impression(s) / ED Diagnoses Final diagnoses:  Urinary frequency  Constipation, unspecified constipation type    Rx / DC Orders ED Discharge Orders     None         Tilden Fossa, MD 11/08/22 [id number]

## 2022-11-08 NOTE — ED Notes (Signed)
Unable to obtain bedside bladder scan. MD Madilyn Hook at bedside with [place] able to visualize bladder and estimated about of urine in bladder.

## 2022-11-08 NOTE — ED Triage Notes (Signed)
Pt reports hx of prostate CA and radiation. States he has only been able to urinate "a trickle" at a time since yesterday afternoon. He also states he feels he is constipated. LBM "tues or wed" per patient. He states he isn't eating much. Pt reports pain over bladder and at the tip of his penis. Denies fever.

## 2022-11-08 NOTE — ED Notes (Signed)
Pt given cup of water to help with providing UA specimen.

## 2022-11-08 NOTE — ED Notes (Signed)
MD Madilyn Hook performed rectal exam to check for stool. No stool felt by MD.

## 2022-11-09 ENCOUNTER — Ambulatory Visit
Admission: RE | Admit: 2022-11-09 | Discharge: 2022-11-09 | Disposition: A | Discharge status: Home / Self Care | Payer: Medicare Other | Source: Ambulatory Visit | Attending: Radiation Oncology | Admitting: Radiation Oncology

## 2022-11-09 ENCOUNTER — Other Ambulatory Visit: Payer: Self-pay

## 2022-11-09 DIAGNOSIS — Z510 Encounter for antineoplastic radiation therapy: Secondary | ICD-10-CM | POA: Diagnosis not present

## 2022-11-09 DIAGNOSIS — C61 Malignant neoplasm of prostate: Secondary | ICD-10-CM | POA: Diagnosis not present

## 2022-11-09 DIAGNOSIS — R300 Dysuria: Secondary | ICD-10-CM | POA: Diagnosis not present

## 2022-11-09 LAB — RAD ONC ARIA SESSION SUMMARY
Course Elapsed Days: 55
Plan Fractions Treated to Date: 15
Plan Prescribed Dose Per Fraction: 2 Gy
Plan Total Fractions Prescribed: 15
Plan Total Prescribed Dose: 30 Gy
Reference Point Dosage Given to Date: 30 Gy
Reference Point Session Dosage Given: 2 Gy
Session Number: 40

## 2022-11-09 NOTE — Telephone Encounter (Signed)
Requesting: lorazepam 1mg   Contract: None UDS: 12/28/2017 Last Visit: 10/07/22 w/ Lillia Abed Next Visit: 12/08/22 Last Refill: 08/19/22 #90 and 1RF  Please Advise

## 2022-11-10 NOTE — Radiation Completion Notes (Addendum)
  Radiation Oncology         [phone] ________________________________  Name: David [name] MRN: [id number]  Date: 11/09/2022  DOB: Aug 10, 1945  Referring Physician: Kasandra Knudsen, M.D. Date of Service: 2022-11-10 Radiation Oncologist: Margaretmary Bayley, M.D. [facility] [place]     RADIATION ONCOLOGY END OF TREATMENT NOTE     Diagnosis:  77 yo gentleman with a locally recurrent prostate cancer with a Gleason 4+4 and PSA of 7.1 - s/p definitive brachytherapy 07/28/19   Intent: Curative     ==========DELIVERED PLANS==========  First Treatment Date: 2022-09-15 - Last Treatment Date: 2022-11-09   Plan Name: Prostate_Pelv Site: Prostate Technique: IMRT Mode: Photon Dose Per Fraction: 1.8 Gy Prescribed Dose (Delivered / Prescribed): 45 Gy / 45 Gy Prescribed Fxs (Delivered / Prescribed): 25 / 25   Plan Name: Prostate_Bst Site: Prostate Technique: IMRT Mode: Photon Dose Per Fraction: 2 Gy Prescribed Dose (Delivered / Prescribed): 30 Gy / 30 Gy Prescribed Fxs (Delivered / Prescribed): 15 / 15     ==========ON TREATMENT VISIT DATES========== 2022-09-18, 2022-09-23, 2022-10-02, 2022-10-07, 2022-10-16, 2022-10-23, 2022-10-29, 2022-11-06    See weekly On Treatment Notes in Epic for details.  He tolerated the daily radiation treatments relatively well with moderate urinary symptoms of frequency, urgency and incomplete emptying.  He had some intermittent constipation and diarrhea and also reported modest fatigue.  The patient will receive a call in about one month from the radiation oncology department. He will continue follow up with his urologist, Dr. Arita Miss as well.  ------------------------------------------------   Margaretmary Dys, MD [facility]  Radiation Oncology Direct Dial: [phone]  Fax: [phone] .com  Skype  LinkedIn

## 2022-11-11 DIAGNOSIS — N481 Balanitis: Secondary | ICD-10-CM | POA: Diagnosis not present

## 2022-11-11 DIAGNOSIS — R8271 Bacteriuria: Secondary | ICD-10-CM | POA: Diagnosis not present

## 2022-11-11 DIAGNOSIS — R300 Dysuria: Secondary | ICD-10-CM | POA: Diagnosis not present

## 2022-11-12 DIAGNOSIS — E559 Vitamin D deficiency, unspecified: Secondary | ICD-10-CM | POA: Diagnosis not present

## 2022-11-12 DIAGNOSIS — N1832 Chronic kidney disease, stage 3b: Secondary | ICD-10-CM | POA: Diagnosis not present

## 2022-11-12 DIAGNOSIS — N200 Calculus of kidney: Secondary | ICD-10-CM | POA: Diagnosis not present

## 2022-11-12 DIAGNOSIS — M898X9 Other specified disorders of bone, unspecified site: Secondary | ICD-10-CM | POA: Diagnosis not present

## 2022-11-15 ENCOUNTER — Encounter (HOSPITAL_BASED_OUTPATIENT_CLINIC_OR_DEPARTMENT_OTHER): Payer: Self-pay | Admitting: Emergency Medicine

## 2022-11-15 ENCOUNTER — Other Ambulatory Visit: Payer: Self-pay

## 2022-11-15 ENCOUNTER — Emergency Department (HOSPITAL_BASED_OUTPATIENT_CLINIC_OR_DEPARTMENT_OTHER)
Admission: EM | Admit: 2022-11-15 | Discharge: 2022-11-15 | Disposition: A | Discharge status: Home / Self Care | Payer: Medicare Other | Attending: Emergency Medicine | Admitting: Emergency Medicine

## 2022-11-15 DIAGNOSIS — R001 Bradycardia, unspecified: Secondary | ICD-10-CM | POA: Diagnosis not present

## 2022-11-15 DIAGNOSIS — Z79899 Other long term (current) drug therapy: Secondary | ICD-10-CM | POA: Insufficient documentation

## 2022-11-15 DIAGNOSIS — K5900 Constipation, unspecified: Secondary | ICD-10-CM | POA: Insufficient documentation

## 2022-11-15 DIAGNOSIS — I10 Essential (primary) hypertension: Secondary | ICD-10-CM | POA: Insufficient documentation

## 2022-11-15 DIAGNOSIS — Z7901 Long term (current) use of anticoagulants: Secondary | ICD-10-CM | POA: Insufficient documentation

## 2022-11-15 LAB — CBC WITH DIFFERENTIAL/PLATELET
Abs Immature Granulocytes: 0.02 10*3/uL (ref 0.00–0.07)
Basophils Absolute: 0 10*3/uL (ref 0.0–0.1)
Basophils Relative: 0 %
Eosinophils Absolute: 0.1 10*3/uL (ref 0.0–0.5)
Eosinophils Relative: 3 %
HCT: 33 % — ABNORMAL LOW (ref 39.0–52.0)
Hemoglobin: 11.2 g/dL — ABNORMAL LOW (ref 13.0–17.0)
Immature Granulocytes: 1 %
Lymphocytes Relative: 14 %
Lymphs Abs: 0.5 10*3/uL — ABNORMAL LOW (ref 0.7–4.0)
MCH: 32.1 pg (ref 26.0–34.0)
MCHC: 33.9 g/dL (ref 30.0–36.0)
MCV: 94.6 fL (ref 80.0–100.0)
Monocytes Absolute: 0.4 10*3/uL (ref 0.1–1.0)
Monocytes Relative: 11 %
Neutro Abs: 2.5 10*3/uL (ref 1.7–7.7)
Neutrophils Relative %: 71 %
Platelets: 147 10*3/uL — ABNORMAL LOW (ref 150–400)
RBC: 3.49 MIL/uL — ABNORMAL LOW (ref 4.22–5.81)
RDW: 14.1 % (ref 11.5–15.5)
WBC: 3.6 10*3/uL — ABNORMAL LOW (ref 4.0–10.5)
nRBC: 0 % (ref 0.0–0.2)

## 2022-11-15 LAB — BASIC METABOLIC PANEL
Anion gap: 10 (ref 5–15)
BUN: 16 mg/dL (ref 8–23)
CO2: 25 mmol/L (ref 22–32)
Calcium: 9.8 mg/dL (ref 8.9–10.3)
Chloride: 103 mmol/L (ref 98–111)
Creatinine, Ser: 1.6 mg/dL — ABNORMAL HIGH (ref 0.61–1.24)
GFR, Estimated: 44 mL/min — ABNORMAL LOW (ref >60)
Glucose, Bld: 104 mg/dL — ABNORMAL HIGH (ref 70–99)
Potassium: 4 mmol/L (ref 3.5–5.1)
Sodium: 138 mmol/L (ref 135–145)

## 2022-11-15 NOTE — ED Triage Notes (Signed)
Pt having problems with constipation.  Pt has been using otc products, prunes.  Pt feels like he is not getting the results expected.  Pt concerned he may have a rectal impaction.  Pt reports last BM was Thursday.

## 2022-11-15 NOTE — Discharge Instructions (Signed)
You have been seen today for your complaint of intermittent constipation. Your lab work was reassuring. Your discharge medications include Dulcolax.  You may take this every day.  This softens your stools and makes it easier to have a bowel movement.  You should also increase your dietary fiber intake, continue using prunes, and continue drinking plenty of water.  You may take MiraLAX if you develop any rectal pain or pressure or if you have not had a bowel movement in 3 days. Follow up with: Your primary care provider in 1 week for reevaluation Please seek immediate medical care if you develop any of the following symptoms: You have a fever and your symptoms suddenly get worse. You leak stool or have blood in your stool. Your abdomen is bloated. You have severe pain in your abdomen. You feel dizzy or you faint. At this time there does not appear to be the presence of an emergent medical condition, however there is always the potential for conditions to change. Please read and follow the below instructions.  Do not take your medicine if  develop an itchy rash, swelling in your mouth or lips, or difficulty breathing; call 911 and seek immediate emergency medical attention if this occurs.  You may review your lab tests and imaging results in their entirety on your MyChart account.  Please discuss all results of fully with your primary care provider and other specialist at your follow-up visit.  Note: Portions of this text may have been transcribed using voice recognition software. Every effort was made to ensure accuracy; however, inadvertent computerized transcription errors may still be present.

## 2022-11-15 NOTE — ED Notes (Signed)
ED Provider at bedside. 

## 2022-11-15 NOTE — ED Provider Notes (Signed)
[place] [facility] Provider Note   CSN: [id number] Arrival date & time: 11/15/22  1215     History  Chief Complaint  Patient presents with   Constipation    David [name] is a 77 y.o. male.  With a significant history including but not limited to hypertension, diverticulosis, hyperlipidemia, anxiety, depression, prostate cancer presenting to the ED for evaluation of constipation.  He states he finished his 40 days of prostate radiation approximate 1.5 weeks ago.  He was told constipation is a main side effect of radiation so he was told to take laxatives during his course.  He has been doing so but states he has suffered with some fecal incontinence during that time.  He discontinued using laxatives when he finished his course 1.5 weeks ago.  Since then he has had only 2 bowel movements.  He states he had to use an enema today to have a bowel movement and the stool looked dry and hard.  He denies any melena or hematochezia.  He states he has been drinking plenty of water.  He is concerned that he has a fecal impaction.  He denies any rectal pain.  No pain with defecation.  He reports intermittent urinary incontinence and retention.  Has no urinary complaints currently.  No abdominal pain.   Constipation      Home Medications Prior to Admission medications   Medication Sig Start Date End Date Taking? Authorizing Provider  acetaminophen (TYLENOL) 325 MG tablet Take 2 tablets (650 mg total) by mouth every 6 (six) hours as needed. 08/13/22   Sloan Leiter, DO  allopurinol (ZYLOPRIM) 100 MG tablet Take 0.5 tablets (50 mg total) by mouth daily. 06/03/22   [name], MD  ascorbic acid (VITAMIN C) 1000 MG tablet Take by mouth.    [provider]  atenolol (TENORMIN) 50 MG tablet Take 1 tablet (50 mg total) by mouth 2 (two) times daily. 07/17/22   [name], MD  atorvastatin (LIPITOR) 10 MG tablet Take 1 tablet (10 mg total) by mouth  every morning. 10/26/22   [name], MD  Cholecalciferol (VITAMIN D-1000 MAX ST) 25 MCG (1000 UT) tablet Take by mouth.    [provider]  clotrimazole-betamethasone (LOTRISONE) lotion Apply topically 2 (two) times daily. For 2 weeks. Patient taking differently: Apply topically daily. For 2 weeks. 08/19/21   [name], MD  colchicine 0.6 MG tablet For gout flares, take 1 tab po once then repeat in 1 hour as needed if pain persists up to a total of 3 doses 05/20/20   [name], MD  ELIQUIS 5 MG TABS tablet TAKE 1 TABLET BY MOUTH TWICE A DAY 04/13/22   [name], MD  fluconazole (DIFLUCAN) 150 MG tablet 1 tab po monthly and prn dermatitis 03/10/22   [name], MD  FLUoxetine (PROZAC) 10 MG tablet Take 2 tablets (20 mg total) by mouth at bedtime. 09/28/22   [name], MD  fluticasone (FLONASE) 50 MCG/ACT nasal spray Place 2 sprays into both nostrils daily. 06/29/22   Saguier, Ramon Dredge, PA-C  ketoconazole (NIZORAL) 2 % cream Apply 1 Application topically daily. 03/10/22   [name], MD  lidocaine (LIDODERM) 5 % Place 1 patch onto the skin daily as needed. Remove & Discard patch within 12 hours or as directed by MD Patient not taking: Reported on 10/07/2022 08/13/22   Tanda Rockers A, DO  LORazepam (ATIVAN) 1 MG tablet  TAKE ONE TABLET BY MOUTH EVERY 8 HOURS AS NEEDED FOR ANXIETY 11/09/22   [name], MD  omeprazole (PRILOSEC) 20 MG capsule Take 20 mg by mouth daily as needed (acid reflux).     [provider]  tiZANidine (ZANAFLEX) 2 MG tablet Take 0.5-2 tablets (1-4 mg total) by mouth every 8 (eight) hours as needed for muscle spasms (joint pain). Patient not taking: Reported on 10/07/2022 07/09/22   [name], MD  triamcinolone (KENALOG) 0.025 % cream SMARTSIG:1 Topical Daily Patient not taking: Reported on 10/07/2022 12/24/21   [provider]      Allergies    Cefaclor, Cephalosporins, and Penicillins    Review of Systems   Review  of Systems  Gastrointestinal:  Positive for constipation.  All other systems reviewed and are negative.   Physical Exam Updated Vital Signs BP 139/78 (BP Location: Left Arm)   Pulse 62   Temp 98.1 F (36.7 C) (Oral)   Resp 18   Ht 5\' 11"  (1.803 m)   Wt 99.8 kg   SpO2 99%   BMI 30.68 kg/m  Physical Exam Vitals and nursing note reviewed. Exam conducted with a chaperone present (Courtlanda, RN).  Constitutional:      General: He is not in acute distress.    Appearance: Normal appearance. He is normal weight. He is not ill-appearing.  HENT:     Head: Normocephalic and atraumatic.  Pulmonary:     Effort: Pulmonary effort is normal. No respiratory distress.  Abdominal:     General: Abdomen is flat.     Tenderness: There is no abdominal tenderness. There is no guarding.  Genitourinary:    Comments: No external rectal masses or lesions.  No internal masses.  No fecal impaction.  Observed stool is brown. Musculoskeletal:        General: Normal range of motion.     Cervical back: Neck supple.  Skin:    General: Skin is warm and dry.  Neurological:     Mental Status: He is alert and oriented to person, place, and time.  Psychiatric:        Mood and Affect: Mood normal.        Behavior: Behavior normal.     ED Results / Procedures / Treatments   Labs (all labs ordered are listed, but only abnormal results are displayed) Labs Reviewed  BASIC METABOLIC PANEL - Abnormal; Notable for the following components:      Result Value   Glucose, Bld 104 (*)    Creatinine, Ser 1.60 (*)    GFR, Estimated 44 (*)    All other components within normal limits  CBC WITH DIFFERENTIAL/PLATELET - Abnormal; Notable for the following components:   WBC 3.6 (*)    RBC 3.49 (*)    Hemoglobin 11.2 (*)    HCT 33.0 (*)    Platelets 147 (*)    Lymphs Abs 0.5 (*)    All other components within normal limits    EKG None  Radiology No results found.  Procedures Procedures    Medications  Ordered in ED Medications - No data to display  ED Course/ Medical Decision Making/ A&P                                 Medical Decision Making Amount and/or Complexity of Data Reviewed Labs: ordered.  This patient presents to the ED for concern of constipation, this involves  an extensive number of treatment options, and is a complaint that carries with it a high risk of complications and morbidity.  The differential diagnosis includes radiation-induced or drug-induced constipation, fecal impaction, mass effect  My initial workup includes DRE  Additional history obtained from: Nursing notes from this visit.  I ordered, reviewed and interpreted labs which include: BMP, CBC.  Mild leukopenia of 3.6 at baseline.  Mild anemia with hemoglobin of 11.2 at baseline.  Creatinine elevated to 1.6 which is baseline  Afebrile, hemodynamically stable.  77 year old male presenting for evaluation of intermittent constipation.  He has prostate cancer and recently finished radiation therapy 1.5 weeks ago.  He struggled with constipation during radiation therapy and needed to rely on laxatives to have bowel movements, however he was having issues with incontinence.  Since finishing radiation therapy he discontinued laxative use.  He has had 2 bowel movements since that time, most recently today which required the use of an enema.  He denies any abdominal pain or rectal pain.  No dyschezia.  No urinary complaints.  He presents today requesting information on treatment options at home.  DRE was negative for fecal impaction.  Overall suspect residual side effects of his radiation therapy.  He was encouraged to take Dulcolax, increase his fiber intake and to continue using laxatives as needed until he stabilizes his bowel pattern.  He was encouraged to follow-up with his primary care provider in 1 week for reevaluation of his symptoms.  He was given return precautions.  Stable at discharge.  At this time there does  not appear to be any evidence of an acute emergency medical condition and the patient appears stable for discharge with appropriate outpatient follow up. Diagnosis was discussed with patient who verbalizes understanding of care plan and is agreeable to discharge. I have discussed return precautions with patient who verbalizes understanding. Patient encouraged to follow-up with their PCP within 1 week. All questions answered.  Patient's case discussed with Dr. Renaye Rakers who agrees with plan to discharge with follow-up.   Note: Portions of this report may have been transcribed using voice recognition software. Every effort was made to ensure accuracy; however, inadvertent computerized transcription errors may still be present.        Final Clinical Impression(s) / ED Diagnoses Final diagnoses:  Constipation, unspecified constipation type    Rx / DC Orders ED Discharge Orders     None         Michelle Piper, Cordelia Poche 11/15/22 1604    Terald Sleeper, MD 11/16/22 [phone]

## 2022-11-18 ENCOUNTER — Ambulatory Visit (HOSPITAL_BASED_OUTPATIENT_CLINIC_OR_DEPARTMENT_OTHER)
Admission: RE | Admit: 2022-11-18 | Discharge: 2022-11-18 | Disposition: A | Discharge status: Home / Self Care | Payer: Medicare Other | Source: Ambulatory Visit | Attending: Cardiology | Admitting: Cardiology

## 2022-11-18 DIAGNOSIS — N1831 Chronic kidney disease, stage 3a: Secondary | ICD-10-CM | POA: Diagnosis not present

## 2022-11-18 DIAGNOSIS — I3139 Other pericardial effusion (noninflammatory): Secondary | ICD-10-CM | POA: Diagnosis not present

## 2022-11-18 DIAGNOSIS — I10 Essential (primary) hypertension: Secondary | ICD-10-CM | POA: Insufficient documentation

## 2022-11-18 DIAGNOSIS — I480 Paroxysmal atrial fibrillation: Secondary | ICD-10-CM | POA: Insufficient documentation

## 2022-11-18 DIAGNOSIS — I77810 Thoracic aortic ectasia: Secondary | ICD-10-CM | POA: Diagnosis not present

## 2022-11-18 DIAGNOSIS — I7789 Other specified disorders of arteries and arterioles: Secondary | ICD-10-CM | POA: Diagnosis not present

## 2022-11-18 DIAGNOSIS — I2510 Atherosclerotic heart disease of native coronary artery without angina pectoris: Secondary | ICD-10-CM | POA: Diagnosis not present

## 2022-11-18 MED ORDER — IOHEXOL 350 MG/ML SOLN
100.0000 mL | Freq: Once | INTRAVENOUS | Status: AC | PRN
  Administered 2022-11-18: 75 mL via INTRAVENOUS

## 2022-11-23 ENCOUNTER — Ambulatory Visit: Payer: Self-pay | Admitting: Licensed Clinical Social Worker

## 2022-11-23 NOTE — Patient Instructions (Signed)
Visit Information  Thank you for taking time to visit with me today. Please don't hesitate to contact me if I can be of assistance to you.   Following are the goals we discussed today:   Goals Addressed             This Visit's Progress    Patient has challenges in managing anxiety or depression issues       Interventions: LCSW spoke via phone today with client about client needs Discussed needs of client's wife Discussed transport needs of client. He said he has no transport needs Discussed client support with PCP, Dr. Danise Edge Discussed client history of receiving cancer treatments at [facility]. He said he had received 40 cancer treatments at [facility] Discussed sleep challenges. Client said he has reduced sleep Reviewed pain issues Reviewed medication procurement for client Discussed recent ED visit of client Discussed program support with RN, LCSW, Pharmacist Discussed Sammuel Hines LCSW working with client a few months ago and giving client information about counseling support with [employer].  Client said that agency had not contacted him regarding counseling support  Discussed role of LCSW with program Provided counseling support Used Active Listening Techniques to allow client to share feelings Encouraged client to call LCSW as needed for SW support at [phone] [facility] client for phone call with LCSW today        Our next appointment is by telephone on 12/14/22 at 9:30 AM   Please call the care guide team at [phone] if you need to cancel or reschedule your appointment.   If you are experiencing a Mental Health or Behavioral Health Crisis or need someone to talk to, please go to [facility] [address], [place] [phone])   The patient verbalized understanding of instructions, educational materials, and care plan provided today and DECLINED offer to receive copy of  patient instructions, educational materials, and care plan.   The patient has been provided with contact information for the care management team and has been advised to call with any health related questions or concerns.   David [name].David [name] MSW, LCSW Licensed Visual merchandiser [facility] Care Management [phone]

## 2022-11-23 NOTE — Patient Outreach (Signed)
  Care Coordination   Initial Visit Note   11/23/2022 Name: David [name] MRN: [id number] DOB: January 03, 1946  David [name] is a 77 y.o. year old male who sees [name], MD for primary care. I spoke with  [name] by phone today.  What matters to the patients health and wellness today?  Patient has challenges in managing anxiety and depression issues    Goals Addressed             This Visit's Progress    Patient has challenges in managing anxiety or depression issues       Interventions: LCSW spoke via phone today with client about client needs Discussed needs of client's wife Discussed transport needs of client. He said he has no transport needs Discussed client support with PCP, Dr. Danise Edge Discussed client history of receiving cancer treatments at [facility]. He said he had received 40 cancer treatments at [facility] Discussed sleep challenges. Client said he has reduced sleep Reviewed pain issues Reviewed medication procurement for client Discussed recent ED visit of client Discussed program support with RN, LCSW, Pharmacist Discussed Sammuel Hines LCSW working with client a few months ago and giving client information about counseling support with [employer].  Client said that agency had not contacted him regarding counseling support  Discussed role of LCSW with program Provided counseling support Used Active Listening Techniques to allow client to share feelings Encouraged client to call LCSW as needed for SW support at [phone] [facility] client for phone call with LCSW today        SDOH assessments and interventions completed:  Yes  SDOH Interventions Today    Flowsheet Row Most Recent Value  SDOH Interventions   Depression Interventions/Treatment  Counseling  Stress Interventions Provide Counseling  [client has stress in managing needs of his spouse]        Care Coordination  Interventions:  Yes, provided   Interventions Today    Flowsheet Row Most Recent Value  Chronic Disease   Chronic disease during today's visit Other  [spoke with client about client needs]  General Interventions   General Interventions Discussed/Reviewed General Interventions Discussed, Community Resources  Education Interventions   Education Provided Provided Education  Provided Verbal Education On [employer]  Mental Health Interventions   Mental Health Discussed/Reviewed Coping Strategies  [has stress in managing needs of his spouse]  Nutrition Interventions   Nutrition Discussed/Reviewed Nutrition Discussed  Pharmacy Interventions   Pharmacy Dicussed/Reviewed Pharmacy Topics Discussed  Safety Interventions   Safety Discussed/Reviewed Fall Risk        Follow up plan: Follow up call scheduled for 12/14/22 at 9:30 AM    Encounter Outcome:  Patient Visit Completed   Kelton Pillar.Pilar Corrales MSW, LCSW Licensed Visual merchandiser [facility] Care Management [phone]

## 2022-11-25 ENCOUNTER — Other Ambulatory Visit: Payer: Self-pay | Admitting: Urology

## 2022-11-25 ENCOUNTER — Telehealth: Payer: Self-pay

## 2022-11-25 ENCOUNTER — Telehealth (HOSPITAL_BASED_OUTPATIENT_CLINIC_OR_DEPARTMENT_OTHER): Payer: Self-pay | Admitting: *Deleted

## 2022-11-25 NOTE — Telephone Encounter (Signed)
Patient left voicemail on [facility] call reporting "trouble urinating and bowel movements". RN attempted to call patient back, no answer and voicemail left.

## 2022-11-25 NOTE — Telephone Encounter (Signed)
   Pre-operative Risk Assessment    Patient Name: David [name]  DOB: [birth year] MRN: [id number]   Last OV: Tele visit [name], [place] 02/13/2022 Upcoming OV: None     Request for Surgical Clearance    Procedure:   Cystoscopy and Circumcision  Date of Surgery:  Clearance 12/08/22                                 Surgeon:  Dr. Ander Slade Group or Practice Name:  [facility]  Phone number:  [phone] Fax number:  [phone]   Type of Clearance Requested:   - Medical  - Pharmacy:  Hold Apixaban (Eliquis) 2 days prior   Type of Anesthesia:  Not Indicated   Additional requests/questions:    Signed, Emmit Pomfret   11/25/2022, 9:38 AM

## 2022-11-25 NOTE — Telephone Encounter (Signed)
Pharmacy please advise on holding Eliquis prior to cystoscopy and circumcision  scheduled for 12/08/2022. Thank you.

## 2022-11-26 NOTE — Telephone Encounter (Signed)
Patient with diagnosis of afib on Eliquis for anticoagulation.    Procedure:  Cystoscopy and Circumcision  Date of procedure: 12/08/22   CHA2DS2-VASc Score = 4   This indicates a 4.8% annual risk of stroke. The patient's score is based upon: CHF History: 0 HTN History: 1 Diabetes History: 0 Stroke History: 0 Vascular Disease History: 1 Age Score: 2 Gender Score: 0      CrCl 54 ml/min Platelet count 147  Per office protocol, patient can hold Eliquis for 2 days prior to procedure.    **This guidance is not considered finalized until pre-operative APP has relayed final recommendations.**

## 2022-11-26 NOTE — Telephone Encounter (Signed)
   Name: David [name]  DOB: May 01, 1945  MRN: [id number]  Primary Cardiologist: [name], MD  Chart reviewed as part of pre-operative protocol coverage. Because of David [name] past medical history and time since last visit, he will require a follow-up in-office visit in order to better assess preoperative cardiovascular risk. Patient has not been seen in office in over 1 year.   Pre-op covering staff: - Please schedule appointment and call patient to inform them. If patient already had an upcoming appointment within acceptable timeframe, please add "pre-op clearance" to the appointment notes so provider is aware. - Please contact requesting surgeon's office via preferred method (i.e, phone, fax) to inform them of need for appointment prior to surgery.  Per office protocol, patient can hold Eliquis for 2 days prior to procedure.     Denyce Robert, NP  11/26/2022, 11:55 AM

## 2022-11-26 NOTE — Telephone Encounter (Signed)
Left message to call back to schedule IN OFFICE PRE OP APPT.

## 2022-11-27 ENCOUNTER — Other Ambulatory Visit: Payer: Medicare Other

## 2022-11-29 ENCOUNTER — Emergency Department (HOSPITAL_COMMUNITY): Payer: Medicare Other

## 2022-11-29 ENCOUNTER — Encounter (HOSPITAL_COMMUNITY): Payer: Self-pay

## 2022-11-29 ENCOUNTER — Emergency Department (HOSPITAL_COMMUNITY)
Admission: EM | Admit: 2022-11-29 | Discharge: 2022-11-29 | Disposition: A | Discharge status: Home / Self Care | Payer: Medicare Other | Attending: Emergency Medicine | Admitting: Emergency Medicine

## 2022-11-29 DIAGNOSIS — Z8546 Personal history of malignant neoplasm of prostate: Secondary | ICD-10-CM | POA: Insufficient documentation

## 2022-11-29 DIAGNOSIS — R0602 Shortness of breath: Secondary | ICD-10-CM | POA: Diagnosis not present

## 2022-11-29 DIAGNOSIS — I129 Hypertensive chronic kidney disease with stage 1 through stage 4 chronic kidney disease, or unspecified chronic kidney disease: Secondary | ICD-10-CM | POA: Insufficient documentation

## 2022-11-29 DIAGNOSIS — K5900 Constipation, unspecified: Secondary | ICD-10-CM

## 2022-11-29 DIAGNOSIS — N183 Chronic kidney disease, stage 3 unspecified: Secondary | ICD-10-CM | POA: Insufficient documentation

## 2022-11-29 DIAGNOSIS — R001 Bradycardia, unspecified: Secondary | ICD-10-CM | POA: Diagnosis not present

## 2022-11-29 DIAGNOSIS — R300 Dysuria: Secondary | ICD-10-CM

## 2022-11-29 DIAGNOSIS — Z7901 Long term (current) use of anticoagulants: Secondary | ICD-10-CM | POA: Insufficient documentation

## 2022-11-29 DIAGNOSIS — Z79899 Other long term (current) drug therapy: Secondary | ICD-10-CM | POA: Insufficient documentation

## 2022-11-29 LAB — CBC WITH DIFFERENTIAL/PLATELET
Abs Immature Granulocytes: 0.01 10*3/uL (ref 0.00–0.07)
Basophils Absolute: 0 10*3/uL (ref 0.0–0.1)
Basophils Relative: 1 %
Eosinophils Absolute: 0.2 10*3/uL (ref 0.0–0.5)
Eosinophils Relative: 5 %
HCT: 32.4 % — ABNORMAL LOW (ref 39.0–52.0)
Hemoglobin: 11.3 g/dL — ABNORMAL LOW (ref 13.0–17.0)
Immature Granulocytes: 0 %
Lymphocytes Relative: 20 %
Lymphs Abs: 0.7 10*3/uL (ref 0.7–4.0)
MCH: 33.7 pg (ref 26.0–34.0)
MCHC: 34.9 g/dL (ref 30.0–36.0)
MCV: 96.7 fL (ref 80.0–100.0)
Monocytes Absolute: 0.4 10*3/uL (ref 0.1–1.0)
Monocytes Relative: 10 %
Neutro Abs: 2.4 10*3/uL (ref 1.7–7.7)
Neutrophils Relative %: 64 %
Platelets: 152 10*3/uL (ref 150–400)
RBC: 3.35 MIL/uL — ABNORMAL LOW (ref 4.22–5.81)
RDW: 14.5 % (ref 11.5–15.5)
WBC: 3.7 10*3/uL — ABNORMAL LOW (ref 4.0–10.5)
nRBC: 0 % (ref 0.0–0.2)

## 2022-11-29 LAB — COMPREHENSIVE METABOLIC PANEL
ALT: 47 U/L — ABNORMAL HIGH (ref 0–44)
AST: 42 U/L — ABNORMAL HIGH (ref 15–41)
Albumin: 3.6 g/dL (ref 3.5–5.0)
Alkaline Phosphatase: 74 U/L (ref 38–126)
Anion gap: 8 (ref 5–15)
BUN: 14 mg/dL (ref 8–23)
CO2: 24 mmol/L (ref 22–32)
Calcium: 10.3 mg/dL (ref 8.9–10.3)
Chloride: 106 mmol/L (ref 98–111)
Creatinine, Ser: 1.47 mg/dL — ABNORMAL HIGH (ref 0.61–1.24)
GFR, Estimated: 49 mL/min — ABNORMAL LOW (ref >60)
Glucose, Bld: 99 mg/dL (ref 70–99)
Potassium: 3.9 mmol/L (ref 3.5–5.1)
Sodium: 138 mmol/L (ref 135–145)
Total Bilirubin: 0.9 mg/dL (ref <1.2)
Total Protein: 6.6 g/dL (ref 6.5–8.1)

## 2022-11-29 LAB — URINALYSIS, ROUTINE W REFLEX MICROSCOPIC
Bilirubin Urine: NEGATIVE
Glucose, UA: NEGATIVE mg/dL
Hgb urine dipstick: NEGATIVE
Ketones, ur: NEGATIVE mg/dL
Leukocytes,Ua: NEGATIVE
Nitrite: NEGATIVE
Protein, ur: NEGATIVE mg/dL
Specific Gravity, Urine: 1.01 (ref 1.005–1.030)
pH: 7 (ref 5.0–8.0)

## 2022-11-29 NOTE — ED Provider Notes (Signed)
[place] EMERGENCY DEPARTMENT AT [facility] Provider Note   CSN: [id number] Arrival date & time: 11/29/22  1046     History  Chief Complaint  Patient presents with   CA Pt   Constipation   Urinary Retention    David [name] is a 77 y.o. male.  Patient to ED with concern for urinary retention. He has a history of prostate CA and completed 40 weeks of radiation therapy about 3 weeks ago. During his treatment he noticed difficulty urinating described as being able to produce urine but feeling like he was not emptying his bladder. No hematuria, fever, abdominal pain or distention. He believed that it was secondary to prostate radiation but reports the symptoms are getting progressively worse. He reports some issues with constipation post-treatment as well, but had a good bowel movement yesterday morning that was soft and east to pass. No abdominal pain. No nausea or vomiting. As a separate issue, he reports feeling SOB today. No cough, chest pain, fever.   The history is provided by the patient. No language interpreter was used.  Constipation      Home Medications Prior to Admission medications   Medication Sig Start Date End Date Taking? Authorizing Provider  acetaminophen (TYLENOL) 325 MG tablet Take 2 tablets (650 mg total) by mouth every 6 (six) hours as needed. 08/13/22   Sloan Leiter, DO  allopurinol (ZYLOPRIM) 100 MG tablet Take 0.5 tablets (50 mg total) by mouth daily. 06/03/22   [name], MD  ascorbic acid (VITAMIN C) 1000 MG tablet Take by mouth.    [provider]  atenolol (TENORMIN) 50 MG tablet Take 1 tablet (50 mg total) by mouth 2 (two) times daily. 07/17/22   [name], MD  atorvastatin (LIPITOR) 10 MG tablet Take 1 tablet (10 mg total) by mouth every morning. 10/26/22   [name], MD  Cholecalciferol (VITAMIN D-1000 MAX ST) 25 MCG (1000 UT) tablet Take by mouth.    [provider]  colchicine 0.6 MG tablet For gout  flares, take 1 tab po once then repeat in 1 hour as needed if pain persists up to a total of 3 doses 05/20/20   [name], MD  ELIQUIS 5 MG TABS tablet TAKE 1 TABLET BY MOUTH TWICE A DAY 04/13/22   [name], MD  FLUoxetine (PROZAC) 10 MG tablet Take 2 tablets (20 mg total) by mouth at bedtime. 09/28/22   [name], MD  fluticasone (FLONASE) 50 MCG/ACT nasal spray Place 2 sprays into both nostrils daily. 06/29/22   Saguier, Ramon Dredge, PA-C  ketoconazole (NIZORAL) 2 % cream Apply 1 Application topically daily. 03/10/22   [name], MD  LORazepam (ATIVAN) 1 MG tablet TAKE ONE TABLET BY MOUTH EVERY 8 HOURS AS NEEDED FOR ANXIETY 11/09/22   [name], MD  omeprazole (PRILOSEC) 20 MG capsule Take 20 mg by mouth daily as needed (acid reflux).     [provider]  tiZANidine (ZANAFLEX) 2 MG tablet Take 0.5-2 tablets (1-4 mg total) by mouth every 8 (eight) hours as needed for muscle spasms (joint pain). 07/09/22   [name], MD  triamcinolone (KENALOG) 0.025 % cream  12/24/21   [provider]      Allergies    Cefaclor, Cephalosporins, and Penicillins    Review of Systems   Review of Systems  Gastrointestinal:  Positive for constipation.    Physical Exam Updated Vital Signs BP (!) 155/84 (BP  Location: Left Arm)   Pulse 61   Temp 98.1 F (36.7 C) (Oral)   Resp 17   Ht 5\' 11"  (1.803 m)   Wt 99.8 kg   SpO2 98%   BMI 30.68 kg/m  Physical Exam Vitals and nursing note reviewed.  Constitutional:      General: He is not in acute distress.    Appearance: Normal appearance. He is not ill-appearing.  Cardiovascular:     Rate and Rhythm: Normal rate and regular rhythm.     Heart sounds: No murmur heard. Pulmonary:     Effort: Pulmonary effort is normal.     Breath sounds: No wheezing, rhonchi or rales.  Abdominal:     General: There is no distension.     Palpations: Abdomen is soft.     Tenderness: There is no abdominal tenderness.   Genitourinary:    Penis: Normal.      Comments: Small tender nodule that is mildly tender to right testicle.  Musculoskeletal:        General: Normal range of motion.     Cervical back: Normal range of motion.  Skin:    General: Skin is warm and dry.  Neurological:     Mental Status: He is alert.     ED Results / Procedures / Treatments   Labs (all labs ordered are listed, but only abnormal results are displayed) Labs Reviewed  CBC WITH DIFFERENTIAL/PLATELET - Abnormal; Notable for the following components:      Result Value   WBC 3.7 (*)    RBC 3.35 (*)    Hemoglobin 11.3 (*)    HCT 32.4 (*)    All other components within normal limits  COMPREHENSIVE METABOLIC PANEL - Abnormal; Notable for the following components:   Creatinine, Ser 1.47 (*)    AST 42 (*)    ALT 47 (*)    GFR, Estimated 49 (*)    All other components within normal limits  URINALYSIS, ROUTINE W REFLEX MICROSCOPIC   Results for orders placed or performed during the hospital encounter of 11/29/22  CBC with Differential  Result Value Ref Range   WBC 3.7 (L) 4.0 - 10.5 K/uL   RBC 3.35 (L) 4.22 - 5.81 MIL/uL   Hemoglobin 11.3 (L) 13.0 - 17.0 g/dL   HCT 06.3 (L) 01.6 - 01.0 %   MCV 96.7 80.0 - 100.0 fL   MCH 33.7 26.0 - 34.0 pg   MCHC 34.9 30.0 - 36.0 g/dL   RDW 93.2 35.5 - 73.2 %   Platelets 152 150 - 400 K/uL   nRBC 0.0 0.0 - 0.2 %   Neutrophils Relative % 64 %   Neutro Abs 2.4 1.7 - 7.7 K/uL   Lymphocytes Relative 20 %   Lymphs Abs 0.7 0.7 - 4.0 K/uL   Monocytes Relative 10 %   Monocytes Absolute 0.4 0.1 - 1.0 K/uL   Eosinophils Relative 5 %   Eosinophils Absolute 0.2 0.0 - 0.5 K/uL   Basophils Relative 1 %   Basophils Absolute 0.0 0.0 - 0.1 K/uL   Immature Granulocytes 0 %   Abs Immature Granulocytes 0.01 0.00 - 0.07 K/uL  Comprehensive metabolic panel  Result Value Ref Range   Sodium 138 135 - 145 mmol/L   Potassium 3.9 3.5 - 5.1 mmol/L   Chloride 106 98 - 111 mmol/L   CO2 24 22 - 32  mmol/L   Glucose, Bld 99 70 - 99 mg/dL   BUN 14 8 - 23 mg/dL  Creatinine, Ser 1.47 (H) 0.61 - 1.24 mg/dL   Calcium 65.7 8.9 - 84.6 mg/dL   Total Protein 6.6 6.5 - 8.1 g/dL   Albumin 3.6 3.5 - 5.0 g/dL   AST 42 (H) 15 - 41 U/L   ALT 47 (H) 0 - 44 U/L   Alkaline Phosphatase 74 38 - 126 U/L   Total Bilirubin 0.9 <1.2 mg/dL   GFR, Estimated 49 (L) >60 mL/min   Anion gap 8 5 - 15  Urinalysis, Routine w reflex microscopic -Urine, Clean Catch  Result Value Ref Range   Color, Urine YELLOW YELLOW   APPearance CLEAR CLEAR   Specific Gravity, Urine 1.010 1.005 - 1.030   pH 7.0 5.0 - 8.0   Glucose, UA NEGATIVE NEGATIVE mg/dL   Hgb urine dipstick NEGATIVE NEGATIVE   Bilirubin Urine NEGATIVE NEGATIVE   Ketones, ur NEGATIVE NEGATIVE mg/dL   Protein, ur NEGATIVE NEGATIVE mg/dL   Nitrite NEGATIVE NEGATIVE   Leukocytes,Ua NEGATIVE NEGATIVE    EKG EKG Interpretation Date/Time:  Sunday November 29 2022 12:21:48 EST Ventricular Rate:  57 PR Interval:  200 QRS Duration:  108 QT Interval:  469 QTC Calculation: 457 R Axis:   -29  Text Interpretation: Sinus rhythm Borderline left axis deviation Probable anteroseptal infarct, old Minimal ST depression, inferior leads Confirmed by Kristine Royal [id number]) on 11/29/2022 1:48:26 PM  Radiology DG Chest 1 View  Result Date: 11/29/2022 CLINICAL DATA:  Shortness of breath EXAM: CHEST  1 VIEW COMPARISON:  12/13/2021 FINDINGS: The heart size and mediastinal contours are within normal limits. No focal airspace consolidation, pleural effusion, or pneumothorax. The visualized skeletal structures are unremarkable. IMPRESSION: No active disease. Electronically Signed   By: Duanne Guess D.O.   On: 11/29/2022 13:07    Procedures Procedures    Medications Ordered in ED Medications - No data to display  ED Course/ Medical Decision Making/ A&P                                 Medical Decision Making This patient presents to the ED for concern of  difficulty urinating, this involves an extensive number of treatment options, and is a complaint that carries with it a high risk of complications and morbidity.  The differential diagnosis includes urinary retention, obstruction, infection   Co morbidities that complicate the patient evaluation  Prostate CA with recent radiation treatment   Additional history obtained:   External records from outside source obtained and reviewed including  Care Everywhere, visit 11/12/22 with Dr. Lequita Halt, nephrology:  stage III CKD, robotic right laparoscopic nephrectomy, hypertension/hyperlipidemia, hyperuricemia, recurrent nephrolithiasis (calcium oxalate) status post percutaneous nephrostomy tube placements in 2020, lithotripsy and ureteral stenting, knee osteoarthritis, paroxysmal atrial fibrillation on Eliquis, right adrenal adenoma 07/11/2002, diverticulosis/diverticulitis status post partial colectomy, prostate cancer diagnosed in 2021 status post radioactive seed implant/brachytherapy 07/27/2020 here for 40-month follow-up. CKD dates back to 2008 with creatinine range 1.3-1.4. Documented episodes of recurrent AKI in 2019 and 2020 with peak creatinine 2.0-2.1.Admitted for nephrolithiasis status post lithotripsy and stone extraction with subsequent right ureteral stricture requiring percutaneous nephrostomy placement. Had unsuccessful pyeloplasty prior to radical robotic lap nephrectomy for chronic ureteral obstruction.  Subsequent CKD progression to current baseline range 1.7-2.0.     Lab Tests:  I Ordered, and personally interpreted labs.  The pertinent results include:  No WBC elevation to suggest infection. Urine clear - no blood or bacteria. Cr. 1.47, c/w baseline.  Imaging Studies ordered:  I ordered imaging studies including CXR  I independently visualized and interpreted imaging which showed no consolidations or infiltrates. I agree with the radiologist interpretation   Cardiac  Monitoring: / EKG:  The patient was maintained on a cardiac monitor.  I personally viewed and interpreted the cardiac monitored which showed an underlying rhythm of: NSR     Problem List / ED Course / Critical interventions / Medication management  Patient concerned about having trouble urinating. No infection. Patient able to produce urine on his own. Bladder scan with <100 cc urine.   Social Determinants of Health:  No transportation issues   Test / Admission - Considered:  No infection identified. Son at bedside on recheck who reports patient has been worried about his symptoms which have been attributed to recent prostate radiation therapy. No obstruction/retention identified. Patient reassured. Encouraged to follow up with his urologist as needed.     Amount and/or Complexity of Data Reviewed Labs: ordered. Radiology: ordered.           Final Clinical Impression(s) / ED Diagnoses Final diagnoses:  Dysuria  Constipation, unspecified constipation type    Rx / DC Orders ED Discharge Orders     None         Elpidio Anis, PA-C 11/30/22 2349    Wynetta Fines, MD 12/04/22 2336

## 2022-11-29 NOTE — ED Notes (Signed)
Patient has a urine sample and urine culture in the main lab 

## 2022-11-29 NOTE — ED Triage Notes (Signed)
Pt c/o intermittent urinary retention and constipation xa couple weeks.  Pt reports he was able to urinate x2 hrs ago, but sts his bladder didn't empty.  Last BM yesterday.  Pt has been seen several times for same.  Hx of prostate CA.

## 2022-11-29 NOTE — Discharge Instructions (Addendum)
Regarding urination: It may take a while longer for your urination to go back to normal. As long as you are able to produce urine, you can watch this at home. Follow up with your urologist if symptoms persist.  IF you have any high fever, severe pain, or are unable to pass any urine at all, those are indications to return to the ED for further management.   Regarding constipation: You can take Miralax daily, drink lots of fluids, add fiber to your diet. See you primary care doctor if this continues to be a problem.

## 2022-11-30 ENCOUNTER — Telehealth: Payer: Self-pay | Admitting: Neurology

## 2022-11-30 ENCOUNTER — Encounter: Payer: Self-pay | Admitting: Neurology

## 2022-11-30 ENCOUNTER — Ambulatory Visit: Payer: Medicare Other | Admitting: Neurology

## 2022-11-30 VITALS — BP 135/77 | HR 60 | Ht 71.0 in | Wt 230.0 lb

## 2022-11-30 DIAGNOSIS — R4189 Other symptoms and signs involving cognitive functions and awareness: Secondary | ICD-10-CM | POA: Diagnosis not present

## 2022-11-30 NOTE — Progress Notes (Signed)
Chief Complaint  Patient presents with   New Patient (Initial Visit)    Rm15, wife present,  Internal referral for memory changes: MOCA 22      ASSESSMENT AND PLAN  David [name] is a 77 y.o. male   Cognitive impairment  MoCA examination 22/30  CT scan from October 2023 reviewed generalized atrophy, moderate small vessel disease no acute abnormality  Laboratory evaluation showed low normal B12 217, otherwise no treatable cause, advised B12 supplement,  Also suggested moderate exercise,  Complete evaluation with MRI of the brain, discussed Namenda and Aricept treatment, decided to hold off  Return To Clinic With NP In 6 Months   DIAGNOSTIC DATA (LABS, IMAGING, TESTING) - I reviewed patient records, labs, notes, testing and imaging myself where available.   MEDICAL HISTORY:  David [name], is a 77 year old male seen in request by his primary care doctor David [name], for evaluation of memory loss, he is accompanied by his wife and son at today's visit November 30, 2022  History is obtained from the patient and review of electronic medical records. I personally reviewed pertinent available imaging films in PACS.   PMHx of  HLD HTN Gout History of A fib-on Eliquis History of kidney stone Prostate cancer,  s/p radiation therapy,   Patient is retired Airline pilot at age 33, used to be very physically active, but in recent couple years, he has to deal with his prostate cancer, radiation, which has caused a lot of irritation in his pelvic region, now become more sedentary, enjoying watch TV, reading books, still walking his neighborhood,  Over the past couple years, he was noted to have gradual onset of memory loss, his son gave me an example of him forgetting how to use his cell phone, go to the operating TV remote control, sometimes worse than the others,  he denies hallucinations, 2023 was the first year for him to let other people to work on his personal income tax,  denies gait abnormality, no family history of dementia  PET scan in April 2024,  1. Low-level, relatively diffuse tracer affinity throughout the prostate is decreased since 11/11/2020. Residual or recurrent local disease cannot be excluded. 2. No evidence of tracer avid nodal, osseous, or distant metastasis. 3. Ascending aortic dilatation at 4.3 cm. Please see 11/17/2021 dedicated CTA. 4. Incidental findings, including: Coronary artery atherosclerosis. Aortic Atherosclerosis (ICD10-I70.0). Left nephrolithiasis. Hepatic steatosis.  PHYSICAL EXAM:   Vitals:   11/30/22 1536  BP: 135/77  Pulse: 60  Weight: 230 lb (104.3 kg)  Height: 5\' 11"  (1.803 m)   Body mass index is 32.08 kg/m.  PHYSICAL EXAMNIATION:  Gen: NAD, conversant, well nourised, well groomed                     Cardiovascular: Regular rate rhythm, no peripheral edema, warm, nontender. Eyes: Conjunctivae clear without exudates or hemorrhage Neck: Supple, no carotid bruits. Pulmonary: Clear to auscultation bilaterally   NEUROLOGICAL EXAM:  MENTAL STATUS: Speech/cognition: Awake, alert, oriented to history taking and casual conversation    11/30/2022    3:47 PM  Montreal Cognitive Assessment   Visuospatial/ Executive (0/5) 5  Naming (0/3) 2  Attention: Read list of digits (0/2) 2  Attention: Read list of letters (0/1) 1  Attention: Serial 7 subtraction starting at 100 (0/3) 3  Language: Repeat phrase (0/2) 2  Language : Fluency (0/1) 1  Abstraction (0/2) 2  Delayed Recall (0/5) 0  Orientation (0/6) 4  Total 22  CRANIAL NERVES: CN II: Visual fields are full to confrontation. Pupils are round equal and briskly reactive to light. CN III, IV, VI: extraocular movement are normal. No ptosis. CN V: Facial sensation is intact to light touch CN VII: Face is symmetric with normal eye closure  CN VIII: Hearing is normal to causal conversation. CN IX, X: Phonation is normal. CN XI: Head turning and shoulder  shrug are intact  MOTOR: There is no pronator drift of out-stretched arms. Muscle bulk and tone are normal. Muscle strength is normal.  REFLEXES: Reflexes are 2+ and symmetric at the biceps, triceps, knees, and ankles. Plantar responses are flexor.  SENSORY: Intact to light touch, pinprick and vibratory sensation are intact in fingers and toes.  COORDINATION: There is no trunk or limb dysmetria noted.  GAIT/STANCE: Able to get up from seated position arm crossed, steady  REVIEW OF SYSTEMS:  Full 14 system review of systems performed and notable only for as above All other review of systems were negative.   ALLERGIES: Allergies  Allergen Reactions   Cefaclor Hives   Cephalosporins Hives   Penicillins Rash    Mild maculopapular rash Has patient had a PCN reaction causing immediate rash, facial/tongue/throat swelling, SOB or lightheadedness with hypotension: No Has patient had a PCN reaction causing severe rash involving mucus membranes or skin necrosis: No Has patient had a PCN reaction that required hospitalization: No Has patient had a PCN reaction occurring within the last 10 years: No If all of the above answers are "NO", then may proceed with Cephalosporin use.     HOME MEDICATIONS: Current Outpatient Medications  Medication Sig Dispense Refill   acetaminophen (TYLENOL) 325 MG tablet Take 2 tablets (650 mg total) by mouth every 6 (six) hours as needed. 36 tablet 0   allopurinol (ZYLOPRIM) 100 MG tablet Take 0.5 tablets (50 mg total) by mouth daily. 45 tablet 1   ascorbic acid (VITAMIN C) 1000 MG tablet Take by mouth.     atenolol (TENORMIN) 50 MG tablet Take 1 tablet (50 mg total) by mouth 2 (two) times daily. 180 tablet 1   atorvastatin (LIPITOR) 10 MG tablet Take 1 tablet (10 mg total) by mouth every morning. 90 tablet 1   Cholecalciferol (VITAMIN D-1000 MAX ST) 25 MCG (1000 UT) tablet Take by mouth.     colchicine 0.6 MG tablet For gout flares, take 1 tab po once  then repeat in 1 hour as needed if pain persists up to a total of 3 doses 15 tablet 0   ELIQUIS 5 MG TABS tablet TAKE 1 TABLET BY MOUTH TWICE A DAY 60 tablet 5   FLUoxetine (PROZAC) 10 MG tablet Take 2 tablets (20 mg total) by mouth at bedtime.     fluticasone (FLONASE) 50 MCG/ACT nasal spray Place 2 sprays into both nostrils daily. 16 g 1   ketoconazole (NIZORAL) 2 % cream Apply 1 Application topically daily. 60 g 2   LORazepam (ATIVAN) 1 MG tablet TAKE ONE TABLET BY MOUTH EVERY 8 HOURS AS NEEDED FOR ANXIETY 90 tablet 1   omeprazole (PRILOSEC) 20 MG capsule Take 20 mg by mouth daily as needed (acid reflux).      tiZANidine (ZANAFLEX) 2 MG tablet Take 0.5-2 tablets (1-4 mg total) by mouth every 8 (eight) hours as needed for muscle spasms (joint pain). 40 tablet 1   triamcinolone (KENALOG) 0.025 % cream      No current facility-administered medications for this visit.    PAST MEDICAL HISTORY: Past  Medical History:  Diagnosis Date   Adenoma of right adrenal gland 07/11/2002   2.4cm , noted on CT ABD   Anxiety    Arthritis of both knees 03/26/2016   Astigmatism    Back pain    BCC (basal cell carcinoma of skin)    under right eye and right ear   Blood transfusion without reported diagnosis    Cataract 09/29/2016   Depression    Diverticulitis 2009   Diverticulosis    Family history of breast cancer    Family history of colon cancer    Fatty liver    GERD (gastroesophageal reflux disease)    Gout    Hearing loss of both ears 03/26/2016   History of atrial fibrillation    x 2 none since 2020   History of chronic prostatitis    started at age 89   History of kidney stones    Hx of adenomatous colonic polyps 02/16/2022   3 diminutive - no recall   Hyperglycemia    Hyperlipidemia    Hypertension    Incomplete right bundle branch block (RBBB)    Internal hemorrhoids    Kidney lesion 06/07/2015   Right midportion, 1.1x1.1 cm hyperechoic, noted on [place] ABD   LAFB (left anterior  fascicular block)    Lipoma of axilla 09/2016   Liver lesion, right lobe 06/07/2015   2.5x2.4x2.4 cm hypoechoic lesion posterior aspect, noted on [place] ABD   Low back pain 03/26/2016   LVH (left ventricular hypertrophy) 08/06/2017   Moderate, Noted on ECHO   Medicare annual wellness visit, subsequent 03/12/2014   OA (osteoarthritis)    Back, Hands, Neck   Obesity 11/25/2007   Qualifier: Diagnosis of  By: Kriste Basque MD, Lonzo Cloud    Other malaise and fatigue 03/13/2013   Premature ventricular complex    Prostate cancer (HCC) dx 2021   Renal insufficiency    Kidney removed right   Scoliosis    Upper thoracic and lumbar   Sinus bradycardia 08/06/2017   Noted on EKG   Vitamin D deficiency    resolved    PAST SURGICAL HISTORY: Past Surgical History:  Procedure Laterality Date   CARDIOVERSION  07/31/2017   CATARACT EXTRACTION, BILATERAL     COLON SURGERY  2009   segmental sigmoid resection   COLONOSCOPY     COLONOSCOPY WITH PROPOFOL     CYSTOSCOPY N/A 07/28/2019   Procedure: CYSTOSCOPY FLEXIBLE;  Surgeon: Malen Gauze, MD;  [facility];  Service: Urology;  Laterality: N/A;   CYSTOSCOPY W/ RETROGRADES Right 06/07/2018   Procedure: CYSTOSCOPY WITH RETROGRADE PYELOGRAM, uphrostogram;  Surgeon: Malen Gauze, MD;  Location: [facility];  Service: Urology;  Laterality: Right;   CYSTOSCOPY WITH URETEROSCOPY AND STENT PLACEMENT Right 02/28/2018   Procedure: CYSTOSCOPY WITH URETEROSCOPY Iverson Alamin;  Surgeon: Ihor Gully, MD;  Location: [facility];  Service: Urology;  Laterality: Right;   CYSTOSCOPY/URETEROSCOPY/HOLMIUM LASER/STENT PLACEMENT Right 11/29/2017   Procedure: CYSTOSCOPY/RETROGRADE/URETEROSCOPY/HOLMIUM LASER/STENT PLACEMENT;  Surgeon: Ihor Gully, MD;  Location: [facility];  Service: Urology;  Laterality: Right;   EYE SURGERY Bilateral 01/12/2017   cataract removal   history of blood tranfusion  age 52   HOLMIUM LASER APPLICATION Right  09/24/2017   Procedure: HOLMIUM LASER APPLICATION;  Surgeon: Ihor Gully, MD;  Location: [facility];  Service: Urology;  Laterality: Right;   IR BALLOON DILATION URETERAL STRICTURE RIGHT  03/14/2018   IR NEPHRO TUBE REMOV/FL  03/17/2018   IR NEPHROSTOGRAM  RIGHT THRU EXISTING ACCESS  03/17/2018   IR NEPHROSTOMY EXCHANGE RIGHT  03/14/2018   IR NEPHROSTOMY EXCHANGE RIGHT  06/21/2018   IR NEPHROSTOMY PLACEMENT RIGHT  03/02/2018   IR NEPHROSTOMY PLACEMENT RIGHT  04/20/2018   IR URETERAL STENT PLACEMENT EXISTING ACCESS RIGHT  03/14/2018   NOSE SURGERY     Submucous resection age 77   NUCLEAR STRESS TEST  06/03/2009   RADIOACTIVE SEED IMPLANT N/A 07/28/2019   Procedure: RADIOACTIVE SEED IMPLANT/BRACHYTHERAPY IMPLANT;  Surgeon: Malen Gauze, MD;  Location: [facility];  Service: Urology;  Laterality: N/A;  90 MINS   ROBOT ASSISTED LAPAROSCOPIC NEPHRECTOMY Right 08/04/2018   Procedure: XI ROBOTIC ASSISTED LAPAROSCOPIC NEPHRECTOMY;  Surgeon: Malen Gauze, MD;  Location: [facility];  Service: Urology;  Laterality: Right;  3 HRS   ROBOT ASSISTED PYELOPLASTY Right 06/07/2018   Procedure: attempted XI ROBOTIC ASSISTED PYELOPLASTY, lysis of adhesions;  Surgeon: Malen Gauze, MD;  Location: [facility];  Service: Urology;  Laterality: Right;  3 HRS   SKIN BIOPSY     SPACE OAR INSTILLATION N/A 07/28/2019   Procedure: SPACE OAR INSTILLATION;  Surgeon: Malen Gauze, MD;  Location: [facility];  Service: Urology;  Laterality: N/A;   URETEROSCOPY WITH HOLMIUM LASER LITHOTRIPSY Right 09/24/2017   Procedure: CYSTOSCOPY, URETEROSCOPY WITH HOLMIUM LASER LITHOTRIPSY, STENT PLACEMENT;  Surgeon: Ihor Gully, MD;  Location: [facility];  Service: Urology;  Laterality: Right;    FAMILY HISTORY: Family History  Problem Relation Age of Onset   Heart disease Mother    Hypertension Mother    Stroke Mother    Colon cancer Mother 89    Breast cancer Mother 65   Heart disease Father        pacemaker   Aortic aneurysm Father    Hypertension Father    Lung cancer Father 68   Heart disease Sister    Atrial fibrillation Sister    Obesity Sister    Sleep apnea Sister    Heart attack Brother    Other Brother        muscle disease   Arthritis Brother    Stroke Brother    Atrial fibrillation Brother    Heart attack Brother    Atrial fibrillation Brother    Diabetes Brother    Atrial fibrillation Brother    Heart attack Brother    Hypertension Brother    Hyperlipidemia Brother    Heart attack Brother    Other Brother        heart valve operation   Atrial fibrillation Brother    Heart attack Maternal Grandmother    Diabetes Maternal Grandmother    Cancer Maternal Grandfather 44       hodgin's lymphoma   Heart attack Paternal Grandmother    Anxiety disorder Paternal Grandmother    Pneumonia Paternal Grandfather    Liver cancer Nephew 2       great nephew; d. 7   Lymphoma Niece 20   Esophageal cancer Neg Hx    Rectal cancer Neg Hx    Stomach cancer Neg Hx    Colon polyps Neg Hx    Pancreatic cancer Neg Hx     SOCIAL HISTORY: Social History   Socioeconomic History   Marital status: Married    Spouse name: Not on file   Number of children: 3   Years of education: Not on file   Highest education level: Not on file  Occupational History   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[employer]  Employer: [employer]  Tobacco Use   Smoking status: Former    Current packs/day: 0.00    Average packs/day: 1 pack/day for 6.0 years (6.0 ttl pk-yrs)    Types: Cigarettes    Start date: 01/11/1969    Quit date: 01/12/1969    Years since quitting: 53.9   Smokeless tobacco: Never   Tobacco comments:    1965-1971  Vaping Use   Vaping status: Never Used  Substance and Sexual Activity   Alcohol use: Not Currently    Comment: 2 whiskey drinks per day   Drug use: Never   Sexual activity: Yes  Other Topics Concern   Not on file  Social History  Narrative   The patient is married for the second time, he has 3 sons.   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   2 alcoholic beverages most days.   1 caffeinated beverage daily   No drug use no current tobacco use he is a prior smoker   12/24/2016   Social Determinants of Health   Financial Resource Strain: Low Risk  (02/20/2021)   Overall Financial Resource Strain (CARDIA)    Difficulty of Paying Living Expenses: Not hard at all  Food Insecurity: Food Insecurity Present (08/03/2022)   Hunger Vital Sign    Worried About Running Out of Food in the Last Year: Sometimes true    Ran Out of Food in the Last Year: Sometimes true  Transportation Needs: No Transportation Needs (08/03/2022)   PRAPARE - Administrator, Civil Service (Medical): No    Lack of Transportation (Non-Medical): No  Physical Activity: Insufficiently Active (02/20/2021)   Exercise Vital Sign    Days of Exercise per Week: 5 days    Minutes of Exercise per Session: 20 min  Stress: Stress Concern Present (11/23/2022)   [employer] of Occupational Health - Occupational Stress Questionnaire    Feeling of Stress : To some extent  Social Connections: Moderately Isolated (02/20/2021)   Social Connection and Isolation Panel [NHANES]    Frequency of Communication with Friends and Family: More than three times a week    Frequency of Social Gatherings with Friends and Family: Twice a week    Attends Religious Services: Never    Database administrator or Organizations: No    Attends Banker Meetings: Never    Marital Status: Married  Catering manager Violence: Not At Risk (08/03/2022)   Humiliation, Afraid, Rape, and Kick questionnaire    Fear of Current or Ex-Partner: No    Emotionally Abused: No    Physically Abused: No    Sexually Abused: No      Levert Feinstein, M.D. Ph.D.  [facility] [address] [place], [place] [address] Ph: [phone] Fax:  [phone]  CC:  Alfredia Ferguson, PA-C [address] [place],  [place] [address]  David Canary, MD

## 2022-11-30 NOTE — Telephone Encounter (Signed)
[employer] medicare NPR sent to GI [phone] 

## 2022-11-30 NOTE — Telephone Encounter (Signed)
2nd attempt to reach the pt to schedule an IN OFFICE APPT FOR PRE OP CLEARANCE. I will send FYI to the surgeon's office pt needs in office before he can be cleared.

## 2022-12-01 ENCOUNTER — Other Ambulatory Visit (INDEPENDENT_AMBULATORY_CARE_PROVIDER_SITE_OTHER): Payer: Medicare Other

## 2022-12-01 DIAGNOSIS — D649 Anemia, unspecified: Secondary | ICD-10-CM | POA: Diagnosis not present

## 2022-12-01 DIAGNOSIS — I10 Essential (primary) hypertension: Secondary | ICD-10-CM

## 2022-12-01 LAB — CBC WITH DIFFERENTIAL/PLATELET
Basophils Absolute: 0 10*3/uL (ref 0.0–0.1)
Basophils Relative: 0.4 % (ref 0.0–3.0)
Eosinophils Absolute: 0.2 10*3/uL (ref 0.0–0.7)
Eosinophils Relative: 4.9 % (ref 0.0–5.0)
HCT: 34.6 % — ABNORMAL LOW (ref 39.0–52.0)
Hemoglobin: 11.6 g/dL — ABNORMAL LOW (ref 13.0–17.0)
Lymphocytes Relative: 21.2 % (ref 12.0–46.0)
Lymphs Abs: 0.8 10*3/uL (ref 0.7–4.0)
MCHC: 33.6 g/dL (ref 30.0–36.0)
MCV: 97.7 fL (ref 78.0–100.0)
Monocytes Absolute: 0.3 10*3/uL (ref 0.1–1.0)
Monocytes Relative: 7.8 % (ref 3.0–12.0)
Neutro Abs: 2.5 10*3/uL (ref 1.4–7.7)
Neutrophils Relative %: 65.7 % (ref 43.0–77.0)
Platelets: 156 10*3/uL (ref 150.0–400.0)
RBC: 3.54 Mil/uL — ABNORMAL LOW (ref 4.22–5.81)
RDW: 15.6 % — ABNORMAL HIGH (ref 11.5–15.5)
WBC: 3.9 10*3/uL — ABNORMAL LOW (ref 4.0–10.5)

## 2022-12-02 ENCOUNTER — Other Ambulatory Visit: Payer: Self-pay | Admitting: Family Medicine

## 2022-12-02 ENCOUNTER — Emergency Department (HOSPITAL_COMMUNITY)
Admission: EM | Admit: 2022-12-02 | Discharge: 2022-12-02 | Disposition: A | Discharge status: Home / Self Care | Payer: Medicare Other | Attending: Emergency Medicine | Admitting: Emergency Medicine

## 2022-12-02 ENCOUNTER — Encounter: Payer: Self-pay | Admitting: *Deleted

## 2022-12-02 ENCOUNTER — Encounter (HOSPITAL_COMMUNITY): Payer: Self-pay

## 2022-12-02 ENCOUNTER — Encounter (HOSPITAL_COMMUNITY): Admission: RE | Admit: 2022-12-02 | Payer: Medicare Other | Source: Ambulatory Visit

## 2022-12-02 DIAGNOSIS — I10 Essential (primary) hypertension: Secondary | ICD-10-CM | POA: Insufficient documentation

## 2022-12-02 DIAGNOSIS — K5900 Constipation, unspecified: Secondary | ICD-10-CM | POA: Diagnosis not present

## 2022-12-02 DIAGNOSIS — Z8546 Personal history of malignant neoplasm of prostate: Secondary | ICD-10-CM | POA: Diagnosis not present

## 2022-12-02 DIAGNOSIS — Z7901 Long term (current) use of anticoagulants: Secondary | ICD-10-CM | POA: Diagnosis not present

## 2022-12-02 DIAGNOSIS — R109 Unspecified abdominal pain: Secondary | ICD-10-CM | POA: Diagnosis not present

## 2022-12-02 DIAGNOSIS — Z85828 Personal history of other malignant neoplasm of skin: Secondary | ICD-10-CM | POA: Insufficient documentation

## 2022-12-02 LAB — IRON,TIBC AND FERRITIN PANEL
%SAT: 37 % (ref 20–48)
Ferritin: 353 ng/mL (ref 24–380)
Iron: 84 ug/dL (ref 50–180)
TIBC: 227 ug/dL — ABNORMAL LOW (ref 250–425)

## 2022-12-02 NOTE — ED Triage Notes (Addendum)
Pt c/o constipation x3 days.  Pain score 4/10.  Pt has been seen numerous times for same.  Pt reports taking Miralax w/o relief.  Hx of prostate CA.  Pt hasn't had treatment in 3 weeks.

## 2022-12-02 NOTE — ED Provider Notes (Signed)
EMERGENCY DEPARTMENT AT [facility] Provider Note   CSN: [id number] Arrival date & time: 12/02/22  1052     History  Chief Complaint  Patient presents with   Constipation    David [name] is a 77 y.o. male.   Constipation Patient with constipation.  Has had some mild abdominal pain.  No good bowel movement for around 3 days until he arrived in the ER.  States he has now had 2 good bowel movements and feeling much better.  Has been taking MiraLAX.  History of prostate cancer treated with radiation.  Since the radiation has been dealing with constipation.    Past Medical History:  Diagnosis Date   Adenoma of right adrenal gland 07/11/2002   2.4cm , noted on CT ABD   Anxiety    Arthritis of both knees 03/26/2016   Astigmatism    Back pain    BCC (basal cell carcinoma of skin)    under right eye and right ear   Blood transfusion without reported diagnosis    Cataract 09/29/2016   Depression    Diverticulitis 2009   Diverticulosis    Family history of breast cancer    Family history of colon cancer    Fatty liver    GERD (gastroesophageal reflux disease)    Gout    Hearing loss of both ears 03/26/2016   History of atrial fibrillation    x 2 none since 2020   History of chronic prostatitis    started at age 72   History of kidney stones    Hx of adenomatous colonic polyps 02/16/2022   3 diminutive - no recall   Hyperglycemia    Hyperlipidemia    Hypertension    Incomplete right bundle branch block (RBBB)    Internal hemorrhoids    Kidney lesion 06/07/2015   Right midportion, 1.1x1.1 cm hyperechoic, noted on [place] ABD   LAFB (left anterior fascicular block)    Lipoma of axilla 09/2016   Liver lesion, right lobe 06/07/2015   2.5x2.4x2.4 cm hypoechoic lesion posterior aspect, noted on [place] ABD   Low back pain 03/26/2016   LVH (left ventricular hypertrophy) 08/06/2017   Moderate, Noted on ECHO   Medicare annual wellness visit, subsequent  03/12/2014   OA (osteoarthritis)    Back, Hands, Neck   Obesity 11/25/2007   Qualifier: Diagnosis of  By: Kriste Basque MD, Lonzo Cloud    Other malaise and fatigue 03/13/2013   Premature ventricular complex    Prostate cancer [facility]) dx 2021   Renal insufficiency    Kidney removed right   Scoliosis    Upper thoracic and lumbar   Sinus bradycardia 08/06/2017   Noted on EKG   Vitamin D deficiency    resolved    Home Medications Prior to Admission medications   Medication Sig Start Date End Date Taking? Authorizing Provider  acetaminophen (TYLENOL) 325 MG tablet Take 2 tablets (650 mg total) by mouth every 6 (six) hours as needed. 08/13/22   Sloan Leiter, DO  allopurinol (ZYLOPRIM) 100 MG tablet TAKE 1/2 TABLET BY MOUTH DAILY 12/02/22   [name], MD  ascorbic acid (VITAMIN C) 1000 MG tablet Take by mouth.    [provider]  atenolol (TENORMIN) 50 MG tablet Take 1 tablet (50 mg total) by mouth 2 (two) times daily. 07/17/22   [name], MD  atorvastatin (LIPITOR) 10 MG tablet Take 1 tablet (10 mg total) by mouth every morning. 10/26/22  [name], MD  Cholecalciferol (VITAMIN D-1000 MAX ST) 25 MCG (1000 UT) tablet Take by mouth.    [provider]  colchicine 0.6 MG tablet For gout flares, take 1 tab po once then repeat in 1 hour as needed if pain persists up to a total of 3 doses 05/20/20   [name], MD  ELIQUIS 5 MG TABS tablet TAKE 1 TABLET BY MOUTH TWICE A DAY 04/13/22   [name], MD  FLUoxetine (PROZAC) 10 MG tablet Take 2 tablets (20 mg total) by mouth at bedtime. 09/28/22   [name], MD  fluticasone (FLONASE) 50 MCG/ACT nasal spray Place 2 sprays into both nostrils daily. 06/29/22   Saguier, Ramon Dredge, PA-C  ketoconazole (NIZORAL) 2 % cream Apply 1 Application topically daily. 03/10/22   [name], MD  LORazepam (ATIVAN) 1 MG tablet TAKE ONE TABLET BY MOUTH EVERY 8 HOURS AS NEEDED FOR ANXIETY 11/09/22   [name], MD  omeprazole  (PRILOSEC) 20 MG capsule Take 20 mg by mouth daily as needed (acid reflux).     [provider]  tiZANidine (ZANAFLEX) 2 MG tablet Take 0.5-2 tablets (1-4 mg total) by mouth every 8 (eight) hours as needed for muscle spasms (joint pain). 07/09/22   [name], MD  triamcinolone (KENALOG) 0.025 % cream  12/24/21   [provider]      Allergies    Cefaclor, Cephalosporins, and Penicillins    Review of Systems   Review of Systems  Gastrointestinal:  Positive for constipation.    Physical Exam Updated Vital Signs BP (!) 152/80 (BP Location: Left Arm)   Pulse (!) 57   Temp 98.4 F (36.9 C) (Oral)   Resp 16   Ht 5\' 11"  (1.803 m)   Wt 104.3 kg   SpO2 97%   BMI 32.08 kg/m  Physical Exam Vitals and nursing note reviewed.  Cardiovascular:     Rate and Rhythm: Regular rhythm.  Abdominal:     Comments: Left lower quadrant tenderness without rebound or guarding.  Mild amount of tenderness.  Musculoskeletal:     Cervical back: Neck supple.  Neurological:     Mental Status: He is alert.     ED Results / Procedures / Treatments   Labs (all labs ordered are listed, but only abnormal results are displayed) Labs Reviewed - No data to display  EKG None  Radiology No results found.  Procedures Procedures    Medications Ordered in ED Medications - No data to display  ED Course/ Medical Decision Making/ A&P                                 Medical Decision Making  Patient abdominal pain.  Constipation.  History of same.  Has been previously seen for same.  Differential diagnosis includes constipation, obstruction, diverticulitis, and just feeling as if he is constipated. However patient feels much better after having bowel movement.  With his do not think we need more workup.  Feeling better.  Will continue his MiraLAX at home.  Will discharge home.        Final Clinical Impression(s) / ED Diagnoses Final diagnoses:  Constipation, unspecified  constipation type    Rx / DC Orders ED Discharge Orders     None         Benjiman Core, MD 12/02/22 1435

## 2022-12-03 NOTE — Telephone Encounter (Signed)
3rd attempt to reach the pt. Left message x 3 for call back. I will update the surgeon's office pt needs to schedule in office appt for pre op clearance.

## 2022-12-04 ENCOUNTER — Other Ambulatory Visit: Payer: Self-pay | Admitting: Urology

## 2022-12-04 DIAGNOSIS — C61 Malignant neoplasm of prostate: Secondary | ICD-10-CM

## 2022-12-06 NOTE — Assessment & Plan Note (Signed)
Hydrate and monitor 

## 2022-12-06 NOTE — Assessment & Plan Note (Signed)
hgba1c acceptable, minimize simple carbs. Increase exercise as tolerated.  

## 2022-12-06 NOTE — Assessment & Plan Note (Signed)
Well controlled, no changes to meds. Encouraged heart healthy diet such as the DASH diet and exercise as tolerated.  °

## 2022-12-06 NOTE — Assessment & Plan Note (Signed)
Encouraged DASH or MIND diet, decrease po intake and increase exercise as tolerated. Needs 7-8 hours of sleep nightly. Avoid trans fats, eat small, frequent meals every 4-5 hours with lean proteins, complex carbs and healthy fats. Minimize simple carbs, high fat foods and processed foods

## 2022-12-06 NOTE — Assessment & Plan Note (Addendum)
Hydrate and monitor. Sees nephrology.

## 2022-12-07 ENCOUNTER — Other Ambulatory Visit: Payer: Self-pay | Admitting: *Deleted

## 2022-12-07 ENCOUNTER — Other Ambulatory Visit: Payer: Self-pay | Admitting: Cardiology

## 2022-12-07 DIAGNOSIS — I7789 Other specified disorders of arteries and arterioles: Secondary | ICD-10-CM

## 2022-12-07 DIAGNOSIS — I480 Paroxysmal atrial fibrillation: Secondary | ICD-10-CM

## 2022-12-07 NOTE — Telephone Encounter (Signed)
Prescription refill request for Eliquis received. Indication:afib Last office visit:11/23 Scr:1.47  11/24 Age: 77 Weight:104.3  kg  Prescription refilled

## 2022-12-08 ENCOUNTER — Ambulatory Visit (INDEPENDENT_AMBULATORY_CARE_PROVIDER_SITE_OTHER): Payer: Medicare Other | Admitting: Family Medicine

## 2022-12-08 ENCOUNTER — Ambulatory Visit (HOSPITAL_COMMUNITY): Admission: RE | Admit: 2022-12-08 | Payer: Medicare Other | Source: Home / Self Care | Admitting: Urology

## 2022-12-08 ENCOUNTER — Encounter (HOSPITAL_COMMUNITY): Admission: RE | Payer: Self-pay | Source: Home / Self Care

## 2022-12-08 ENCOUNTER — Ambulatory Visit
Admission: RE | Admit: 2022-12-08 | Discharge: 2022-12-08 | Disposition: A | Discharge status: Home / Self Care | Payer: Medicare Other | Source: Ambulatory Visit | Attending: Radiation Oncology | Admitting: Radiation Oncology

## 2022-12-08 ENCOUNTER — Encounter: Payer: Self-pay | Admitting: Family Medicine

## 2022-12-08 VITALS — BP 128/64 | HR 57 | Temp 97.9°F | Resp 16 | Ht 71.0 in | Wt 232.8 lb

## 2022-12-08 DIAGNOSIS — N135 Crossing vessel and stricture of ureter without hydronephrosis: Secondary | ICD-10-CM | POA: Diagnosis not present

## 2022-12-08 DIAGNOSIS — N189 Chronic kidney disease, unspecified: Secondary | ICD-10-CM

## 2022-12-08 DIAGNOSIS — R3915 Urgency of urination: Secondary | ICD-10-CM

## 2022-12-08 DIAGNOSIS — M533 Sacrococcygeal disorders, not elsewhere classified: Secondary | ICD-10-CM | POA: Insufficient documentation

## 2022-12-08 DIAGNOSIS — R300 Dysuria: Secondary | ICD-10-CM | POA: Diagnosis not present

## 2022-12-08 DIAGNOSIS — R739 Hyperglycemia, unspecified: Secondary | ICD-10-CM

## 2022-12-08 DIAGNOSIS — F418 Other specified anxiety disorders: Secondary | ICD-10-CM

## 2022-12-08 DIAGNOSIS — C61 Malignant neoplasm of prostate: Secondary | ICD-10-CM

## 2022-12-08 DIAGNOSIS — N1831 Chronic kidney disease, stage 3a: Secondary | ICD-10-CM | POA: Diagnosis not present

## 2022-12-08 DIAGNOSIS — Z79899 Other long term (current) drug therapy: Secondary | ICD-10-CM | POA: Diagnosis not present

## 2022-12-08 DIAGNOSIS — I10 Essential (primary) hypertension: Secondary | ICD-10-CM

## 2022-12-08 DIAGNOSIS — M1A9XX0 Chronic gout, unspecified, without tophus (tophi): Secondary | ICD-10-CM | POA: Diagnosis not present

## 2022-12-08 DIAGNOSIS — E6609 Other obesity due to excess calories: Secondary | ICD-10-CM

## 2022-12-08 LAB — URINALYSIS, ROUTINE W REFLEX MICROSCOPIC
Bilirubin Urine: NEGATIVE
Ketones, ur: NEGATIVE
Leukocytes,Ua: NEGATIVE
Nitrite: NEGATIVE
Specific Gravity, Urine: >=1.03 — AB (ref 1.000–1.030)
Urine Glucose: NEGATIVE
Urobilinogen, UA: 0.2 (ref 0.0–1.0)
pH: 6 (ref 5.0–8.0)

## 2022-12-08 SURGERY — CIRCUMCISION ADULT
Anesthesia: General

## 2022-12-08 MED ORDER — FLUOXETINE HCL 20 MG PO TABS: 20.0000 mg | ORAL_TABLET | Freq: Two times a day (BID) | ORAL | 1 refills | Status: DC

## 2022-12-08 NOTE — Assessment & Plan Note (Signed)
Is struggling with stenosis of penile head/meatus and is considering a circumcision. Now struggling with dysuria, he is describing almost a fissure with cutting pain. He sees Dr Arita Miss at [facility] and is going to reach out and discuss.

## 2022-12-08 NOTE — Progress Notes (Signed)
  Radiation Oncology         [id number]) [phone] ________________________________  Name: David [name] MRN: [id number]  Date of Service: 12/08/2022  DOB: December 22, 1945  Post Treatment Telephone Note  Diagnosis:   77 yo gentleman with a locally recurrent prostate cancer with a Gleason 4+4 and PSA of 7.1 - s/p definitive brachytherapy 07/28/19 (as documented in provider EOT note)  The patient was not available for call today. Voicemail left.  Patient has a scheduled follow up visit with his urologist, Dr. Arita Miss, on 03/2023 for ongoing surveillance. He was counseled that PSA levels will be drawn in the urology office, and was reassured that additional time is expected to improve bowel and bladder symptoms. He was encouraged to call back with concerns or questions regarding radiation.   Ruel Favors, LPN

## 2022-12-08 NOTE — Patient Instructions (Addendum)
Tetanus at pharmacy Covid booster  Shingrix is the new shingles shot, 2 shots over 2-6 months, confirm coverage with insurance and document, then can return here for shots with nurse appt or at pharmacy   RSV,Respiratory Syncitial Virus vaccine, Arexvy at pharmacy    Encouraged increased hydration and fiber in diet. Daily probiotics. If bowels not moving can use MOM 2 tbls po in 4 oz of warm prune juice by mouth every 2-3 days. If no results then repeat in 4 hours with  Dulcolax suppository pr, may repeat again in 4 more hours as needed. Seek care if symptoms worsen. Consider daily Miralax and/or Dulcolax if symptoms persist.    'Constipation, Adult Constipation is when a person has fewer than three bowel movements in a week, has difficulty having a bowel movement, or has stools (feces) that are dry, hard, or larger than normal. Constipation may be caused by an underlying condition. It may become worse with age if a person takes certain medicines and does not take in enough fluids. Follow these instructions at home: Eating and drinking  Eat foods that have a lot of fiber, such as beans, whole grains, and fresh fruits and vegetables. Limit foods that are low in fiber and high in fat and processed sugars, such as fried or sweet foods. These include french fries, hamburgers, cookies, candies, and soda. Drink enough fluid to keep your urine pale yellow. General instructions Exercise regularly or as told by your health care provider. Try to do 150 minutes of moderate exercise each week. Use the bathroom when you have the urge to go. Do not hold it in. Take over-the-counter and prescription medicines only as told by your health care provider. This includes any fiber supplements. During bowel movements: Practice deep breathing while relaxing the lower abdomen. Practice pelvic floor relaxation. Watch your condition for any changes. Let your health care provider know about them. Keep all follow-up  visits as told by your health care provider. This is important. Contact a health care provider if: You have pain that gets worse. You have a fever. You do not have a bowel movement after 4 days. You vomit. You are not hungry or you lose weight. You are bleeding from the opening between the buttocks (anus). You have thin, pencil-like stools. Get help right away if: You have a fever and your symptoms suddenly get worse. You leak stool or have blood in your stool. Your abdomen is bloated. You have severe pain in your abdomen. You feel dizzy or you faint. Summary Constipation is when a person has fewer than three bowel movements in a week, has difficulty having a bowel movement, or has stools (feces) that are dry, hard, or larger than normal. Eat foods that have a lot of fiber, such as beans, whole grains, and fresh fruits and vegetables. Drink enough fluid to keep your urine pale yellow. Take over-the-counter and prescription medicines only as told by your health care provider. This includes any fiber supplements. This information is not intended to replace advice given to you by your health care provider. Make sure you discuss any questions you have with your health care provider. Document Revised: 11/12/2021 Document Reviewed: 11/12/2021 Elsevier Patient Education  2024 [employer].

## 2022-12-08 NOTE — Assessment & Plan Note (Signed)
Hydrate and monitor 

## 2022-12-08 NOTE — Assessment & Plan Note (Signed)
Patient continues to follow with urology and oncology and will likely end up with chronic treatments but they have not been established yet.

## 2022-12-08 NOTE — Assessment & Plan Note (Signed)
This is worsening and when he gets the urge he has only seconds before he needs to be in the restroom. Is working with urology but the Flomax has not been greatly helpful.

## 2022-12-08 NOTE — Assessment & Plan Note (Signed)
A pressure type pain especially when he sits will proceed with a xray to further investigate.

## 2022-12-09 NOTE — Progress Notes (Signed)
Subjective:    Patient ID: David [name], male    DOB: 21-Mar-1945, 77 y.o.   MRN: [id number]  Chief Complaint  Patient presents with  . Follow-up    HPI Discussed the use of AI scribe software for clinical note transcription with the patient, who gave verbal consent to proceed.  History of Present Illness   The patient, with a history of prostate cancer treated with radiotherapy, presents with multiple complaints. The primary concern is urinary urgency and incomplete emptying, which has been ongoing for several weeks. The patient reports that he often has to "coax" urine out of the bladder, and when the urge to urinate comes, he has about five seconds to reach a commode. The patient also reports pain in the prostate, rectum, and testicles, describing it as an ache when sitting and a sharp pain when attempting to urinate.  In addition to these symptoms, the patient also reports fatigue and minimal energy, which has limited his activities to minimal housekeeping. He also reports poor sleep quality. The patient has been experiencing hip, back, knee, and shoulder pain, which he attributes to lifting a heavy object. The patient also mentions memory issues, which he finds overwhelming.  The patient has a history of colonoscopy about a year and a half ago, where three small precancerous polyps were found and removed. He also reports occasional small bowel movements, described as "pellets," but also regular bowel movements. The patient mentions seeing a trace of blood in his stool twice, close to the height of his radioactive treatment for prostate cancer.        Past Medical History:  Diagnosis Date  . Adenoma of right adrenal gland 07/11/2002   2.4cm , noted on CT ABD  . Anxiety   . Arthritis of both knees 03/26/2016  . Astigmatism   . Back pain   . BCC (basal cell carcinoma of skin)    under right eye and right ear  . Blood transfusion without reported diagnosis   . Cataract  09/29/2016  . Depression   . Diverticulitis 2009  . Diverticulosis   . Family history of breast cancer   . Family history of colon cancer   . Fatty liver   . GERD (gastroesophageal reflux disease)   . Gout   . Hearing loss of both ears 03/26/2016  . History of atrial fibrillation    x 2 none since 2020  . History of chronic prostatitis    started at age 1  . History of kidney stones   . Hx of adenomatous colonic polyps 02/16/2022   3 diminutive - no recall  . Hyperglycemia   . Hyperlipidemia   . Hypertension   . Incomplete right bundle branch block (RBBB)   . Internal hemorrhoids   . Kidney lesion 06/07/2015   Right midportion, 1.1x1.1 cm hyperechoic, noted on [place] ABD  . LAFB (left anterior fascicular block)   . Lipoma of axilla 09/2016  . Liver lesion, right lobe 06/07/2015   2.5x2.4x2.4 cm hypoechoic lesion posterior aspect, noted on [place] ABD  . Low back pain 03/26/2016  . LVH (left ventricular hypertrophy) 08/06/2017   Moderate, Noted on ECHO  . Medicare annual wellness visit, subsequent 03/12/2014  . OA (osteoarthritis)    Back, Hands, Neck  . Obesity 11/25/2007   Qualifier: Diagnosis of  By: Kriste Basque MD, Lonzo Cloud   . Other malaise and fatigue 03/13/2013  . Premature ventricular complex   . Prostate cancer (HCC) dx 2021  . Renal  insufficiency    Kidney removed right  . Scoliosis    Upper thoracic and lumbar  . Sinus bradycardia 08/06/2017   Noted on EKG  . Vitamin D deficiency    resolved    Past Surgical History:  Procedure Laterality Date  . CARDIOVERSION  07/31/2017  . CATARACT EXTRACTION, BILATERAL    . COLON SURGERY  2009   segmental sigmoid resection  . COLONOSCOPY    . COLONOSCOPY WITH PROPOFOL    . CYSTOSCOPY N/A 07/28/2019   Procedure: CYSTOSCOPY FLEXIBLE;  Surgeon: Malen Gauze, MD;  Location: [facility];  Service: Urology;  Laterality: N/A;  . CYSTOSCOPY W/ RETROGRADES Right 06/07/2018   Procedure: CYSTOSCOPY WITH RETROGRADE  PYELOGRAM, uphrostogram;  Surgeon: Malen Gauze, MD;  Location: [facility];  Service: Urology;  Laterality: Right;  . CYSTOSCOPY WITH URETEROSCOPY AND STENT PLACEMENT Right 02/28/2018   Procedure: CYSTOSCOPY WITH URETEROSCOPY Iverson Alamin;  Surgeon: Ihor Gully, MD;  Location: [facility];  Service: Urology;  Laterality: Right;  . CYSTOSCOPY/URETEROSCOPY/HOLMIUM LASER/STENT PLACEMENT Right 11/29/2017   Procedure: CYSTOSCOPY/RETROGRADE/URETEROSCOPY/HOLMIUM LASER/STENT PLACEMENT;  Surgeon: Ihor Gully, MD;  Location: [facility];  Service: Urology;  Laterality: Right;  . EYE SURGERY Bilateral 01/12/2017   cataract removal  . history of blood tranfusion  age 76  . HOLMIUM LASER APPLICATION Right 09/24/2017   Procedure: HOLMIUM LASER APPLICATION;  Surgeon: Ihor Gully, MD;  Location: [facility];  Service: Urology;  Laterality: Right;  . IR BALLOON DILATION URETERAL STRICTURE RIGHT  03/14/2018  . IR NEPHRO TUBE REMOV/FL  03/17/2018  . IR NEPHROSTOGRAM RIGHT THRU EXISTING ACCESS  03/17/2018  . IR NEPHROSTOMY EXCHANGE RIGHT  03/14/2018  . IR NEPHROSTOMY EXCHANGE RIGHT  06/21/2018  . IR NEPHROSTOMY PLACEMENT RIGHT  03/02/2018  . IR NEPHROSTOMY PLACEMENT RIGHT  04/20/2018  . IR URETERAL STENT PLACEMENT EXISTING ACCESS RIGHT  03/14/2018  . NOSE SURGERY     Submucous resection age 8  . NUCLEAR STRESS TEST  06/03/2009  . RADIOACTIVE SEED IMPLANT N/A 07/28/2019   Procedure: RADIOACTIVE SEED IMPLANT/BRACHYTHERAPY IMPLANT;  Surgeon: Malen Gauze, MD;  [facility];  Service: Urology;  Laterality: N/A;  90 MINS  . ROBOT ASSISTED LAPAROSCOPIC NEPHRECTOMY Right 08/04/2018   Procedure: XI ROBOTIC ASSISTED LAPAROSCOPIC NEPHRECTOMY;  Surgeon: Malen Gauze, MD;  Location: [facility];  Service: Urology;  Laterality: Right;  3 HRS  . ROBOT ASSISTED PYELOPLASTY Right 06/07/2018   Procedure: attempted XI ROBOTIC ASSISTED PYELOPLASTY, lysis of  adhesions;  Surgeon: Malen Gauze, MD;  Location: [facility];  Service: Urology;  Laterality: Right;  3 HRS  . SKIN BIOPSY    . SPACE OAR INSTILLATION N/A 07/28/2019   Procedure: SPACE OAR INSTILLATION;  Surgeon: Malen Gauze, MD;  Location: [facility];  Service: Urology;  Laterality: N/A;  . URETEROSCOPY WITH HOLMIUM LASER LITHOTRIPSY Right 09/24/2017   Procedure: CYSTOSCOPY, URETEROSCOPY WITH HOLMIUM LASER LITHOTRIPSY, STENT PLACEMENT;  Surgeon: Ihor Gully, MD;  Location: [facility];  Service: Urology;  Laterality: Right;    Family History  Problem Relation Age of Onset  . Heart disease Mother   . Hypertension Mother   . Stroke Mother   . Colon cancer Mother 83  . Breast cancer Mother 75  . Heart disease Father        pacemaker  . Aortic aneurysm Father   . Hypertension Father   . Lung cancer Father 15  . Heart disease Sister   .  Atrial fibrillation Sister   . Obesity Sister   . Sleep apnea Sister   . Heart attack Brother   . Other Brother        muscle disease  . Arthritis Brother   . Stroke Brother   . Atrial fibrillation Brother   . Heart attack Brother   . Atrial fibrillation Brother   . Diabetes Brother   . Atrial fibrillation Brother   . Heart attack Brother   . Hypertension Brother   . Hyperlipidemia Brother   . Heart attack Brother   . Other Brother        heart valve operation  . Atrial fibrillation Brother   . Heart attack Maternal Grandmother   . Diabetes Maternal Grandmother   . Cancer Maternal Grandfather 44       hodgin's lymphoma  . Heart attack Paternal Grandmother   . Anxiety disorder Paternal Grandmother   . Pneumonia Paternal Grandfather   . Liver cancer Nephew 2       great nephew; d. 7  . Lymphoma Niece 74  . Esophageal cancer Neg Hx   . Rectal cancer Neg Hx   . Stomach cancer Neg Hx   . Colon polyps Neg Hx   . Pancreatic cancer Neg Hx     Social History   Socioeconomic History  . Marital  status: Married    Spouse name: Not on file  . Number of children: 3  . Years of education: Not on file  . Highest education level: Not on file  Occupational History  . 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 Smoking status: Former    Current packs/day: 0.00    Average packs/day: 1 pack/day for 6.0 years (6.0 ttl pk-yrs)    Types: Cigarettes    Start date: 01/11/1969    Quit date: 01/12/1969    Years since quitting: 53.9  . Smokeless tobacco: Never  . Tobacco comments:    [id number]  Vaping Use  . Vaping status: Never Used  Substance and Sexual Activity  . Alcohol use: Not Currently    Comment: 2 whiskey drinks per day  . Drug use: Never  . Sexual activity: Yes  Other Topics Concern  . Not on file  Social History Narrative   The patient is married for the second time, he has 3 sons.   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   2 alcoholic beverages most days.   1 caffeinated beverage daily   No drug use no current tobacco use he is a prior smoker   12/24/2016   Social Determinants of Health   Financial Resource Strain: Low Risk  (02/20/2021)   Overall Financial Resource Strain (CARDIA)   . Difficulty of Paying Living Expenses: Not hard at all  Food Insecurity: Food Insecurity Present (08/03/2022)   Hunger Vital Sign   . Worried About Programme researcher, broadcasting/film/video in the Last Year: Sometimes true   . Ran Out of Food in the Last Year: Sometimes true  Transportation Needs: No Transportation Needs (08/03/2022)   PRAPARE - Transportation   . Lack of Transportation (Medical): No   . Lack of Transportation (Non-Medical): No  Physical Activity: Insufficiently Active (02/20/2021)   Exercise Vital Sign   . Days of Exercise per Week: 5 days   . Minutes of Exercise per Session: 20 min  Stress: Stress Concern Present (11/23/2022)   [employer] of Occupational Health - Occupational Stress Questionnaire   . Feeling of Stress :  To some extent  Social Connections: Moderately  Isolated (02/20/2021)   Social Connection and Isolation Panel [NHANES]   . Frequency of Communication with Friends and Family: More than three times a week   . Frequency of Social Gatherings with Friends and Family: Twice a week   . Attends Religious Services: Never   . Active Member of Clubs or Organizations: No   . Attends Banker Meetings: Never   . Marital Status: Married  Catering manager Violence: Not At Risk (08/03/2022)   Humiliation, Afraid, Rape, and Kick questionnaire   . Fear of Current or Ex-Partner: No   . Emotionally Abused: No   . Physically Abused: No   . Sexually Abused: No    Outpatient Medications Prior to Visit  Medication Sig Dispense Refill  . acetaminophen (TYLENOL) 325 MG tablet Take 2 tablets (650 mg total) by mouth every 6 (six) hours as needed. 36 tablet 0  . allopurinol (ZYLOPRIM) 100 MG tablet TAKE 1/2 TABLET BY MOUTH DAILY 45 tablet 1  . ascorbic acid (VITAMIN C) 1000 MG tablet Take by mouth.    Marland Kitchen atenolol (TENORMIN) 50 MG tablet Take 1 tablet (50 mg total) by mouth 2 (two) times daily. 180 tablet 1  . atorvastatin (LIPITOR) 10 MG tablet Take 1 tablet (10 mg total) by mouth every morning. 90 tablet 1  . Cholecalciferol (VITAMIN D-1000 MAX ST) 25 MCG (1000 UT) tablet Take by mouth.    . colchicine 0.6 MG tablet For gout flares, take 1 tab po once then repeat in 1 hour as needed if pain persists up to a total of 3 doses 15 tablet 0  . ELIQUIS 5 MG TABS tablet TAKE 1 TABLET BY MOUTH 2 TIMES A DAY 60 tablet 5  . fluticasone (FLONASE) 50 MCG/ACT nasal spray Place 2 sprays into both nostrils daily. 16 g 1  . ketoconazole (NIZORAL) 2 % cream Apply 1 Application topically daily. 60 g 2  . LORazepam (ATIVAN) 1 MG tablet TAKE ONE TABLET BY MOUTH EVERY 8 HOURS AS NEEDED FOR ANXIETY 90 tablet 1  . omeprazole (PRILOSEC) 20 MG capsule Take 20 mg by mouth daily as needed (acid reflux).     Marland Kitchen tiZANidine (ZANAFLEX) 2 MG tablet Take 0.5-2 tablets (1-4 mg total) by  mouth every 8 (eight) hours as needed for muscle spasms (joint pain). 40 tablet 1  . FLUoxetine (PROZAC) 10 MG tablet Take 2 tablets (20 mg total) by mouth at bedtime.    . triamcinolone (KENALOG) 0.025 % cream  (Patient not taking: Reported on 12/08/2022)     No facility-administered medications prior to visit.    Allergies  Allergen Reactions  . Cefaclor Hives  . Cephalosporins Hives  . Penicillins Rash    Mild maculopapular rash Has patient had a PCN reaction causing immediate rash, facial/tongue/throat swelling, SOB or lightheadedness with hypotension: No Has patient had a PCN reaction causing severe rash involving mucus membranes or skin necrosis: No Has patient had a PCN reaction that required hospitalization: No Has patient had a PCN reaction occurring within the last 10 years: No If all of the above answers are "NO", then may proceed with Cephalosporin use.     Review of Systems  Constitutional:  Positive for malaise/fatigue. Negative for fever.  HENT:  Negative for congestion.   Eyes:  Negative for blurred vision.  Respiratory:  Negative for shortness of breath.   Cardiovascular:  Negative for chest pain, palpitations and leg swelling.  Gastrointestinal:  Positive for  abdominal pain. Negative for blood in stool and nausea.  Genitourinary:  Positive for dysuria and urgency. Negative for frequency.  Musculoskeletal:  Negative for falls.  Skin:  Negative for rash.  Neurological:  Negative for dizziness, loss of consciousness and headaches.  Endo/Heme/Allergies:  Negative for environmental allergies.  Psychiatric/Behavioral:  Positive for depression. The patient is nervous/anxious.       Objective:    Physical Exam Vitals reviewed.  Constitutional:      Appearance: Normal appearance. He is not ill-appearing.  HENT:     Head: Normocephalic and atraumatic.     Nose: Nose normal.  Eyes:     Conjunctiva/sclera: Conjunctivae normal.  Cardiovascular:     Rate and  Rhythm: Normal rate.     Pulses: Normal pulses.     Heart sounds: Normal heart sounds. No murmur heard. Pulmonary:     Effort: Pulmonary effort is normal.     Breath sounds: Normal breath sounds. No wheezing.  Abdominal:     Palpations: Abdomen is soft. There is no mass.     Tenderness: There is no abdominal tenderness.  Musculoskeletal:     Cervical back: Normal range of motion.     Right lower leg: No edema.     Left lower leg: No edema.  Skin:    General: Skin is warm and dry.  Neurological:     General: No focal deficit present.     Mental Status: He is alert and oriented to person, place, and time.  Psychiatric:        Mood and Affect: Mood normal.   BP 128/64 (BP Location: Left Arm, Patient Position: Sitting, Cuff Size: Large)   Pulse (!) 57   Temp 97.9 F (36.6 C) (Oral)   Resp 16   Ht 5\' 11"  (1.803 m)   Wt 232 lb 12.8 oz (105.6 kg)   SpO2 95%   BMI 32.47 kg/m  Wt Readings from Last 3 Encounters:  12/08/22 232 lb 12.8 oz (105.6 kg)  12/02/22 230 lb (104.3 kg)  11/30/22 230 lb (104.3 kg)    Diabetic Foot Exam - Simple   No data filed    Lab Results  Component Value Date   WBC 3.9 (L) 12/01/2022   HGB 11.6 (L) 12/01/2022   HCT 34.6 (L) 12/01/2022   PLT 156.0 12/01/2022   GLUCOSE 99 11/29/2022   CHOL 112 10/26/2022   TRIG 201.0 (H) 10/26/2022   HDL 35.80 (L) 10/26/2022   LDLDIRECT 168.5 03/29/2006   LDLCALC 36 10/26/2022   ALT 47 (H) 11/29/2022   AST 42 (H) 11/29/2022   NA 138 11/29/2022   K 3.9 11/29/2022   CL 106 11/29/2022   CREATININE 1.47 (H) 11/29/2022   BUN 14 11/29/2022   CO2 24 11/29/2022   TSH 4.16 10/26/2022   PSA 4.01 (H) 07/16/2021   INR 1.0 07/25/2019   HGBA1C 5.9 10/26/2022    Lab Results  Component Value Date   TSH 4.16 10/26/2022   Lab Results  Component Value Date   WBC 3.9 (L) 12/01/2022   HGB 11.6 (L) 12/01/2022   HCT 34.6 (L) 12/01/2022   MCV 97.7 12/01/2022   PLT 156.0 12/01/2022   Lab Results  Component Value  Date   NA 138 11/29/2022   K 3.9 11/29/2022   CO2 24 11/29/2022   GLUCOSE 99 11/29/2022   BUN 14 11/29/2022   CREATININE 1.47 (H) 11/29/2022   BILITOT 0.9 11/29/2022   ALKPHOS 74 11/29/2022   AST 42 (  H) 11/29/2022   ALT 47 (H) 11/29/2022   PROT 6.6 11/29/2022   ALBUMIN 3.6 11/29/2022   CALCIUM 10.3 11/29/2022   ANIONGAP 8 11/29/2022   EGFR 37 (L) 12/18/2021   GFR 41.63 (L) 10/26/2022   Lab Results  Component Value Date   CHOL 112 10/26/2022   Lab Results  Component Value Date   HDL 35.80 (L) 10/26/2022   Lab Results  Component Value Date   LDLCALC 36 10/26/2022   Lab Results  Component Value Date   TRIG 201.0 (H) 10/26/2022   Lab Results  Component Value Date   CHOLHDL 3 10/26/2022   Lab Results  Component Value Date   HGBA1C 5.9 10/26/2022       Assessment & Plan:  Chronic renal impairment, unspecified CKD stage Assessment & Plan: Hydrate and monitor. Sees nephrology.    Essential hypertension, benign Assessment & Plan: Well controlled, no changes to meds. Encouraged heart healthy diet such as the DASH diet and exercise as tolerated.    Chronic gout without tophus, unspecified cause, unspecified site Assessment & Plan: Hydrate and monitor    Hyperglycemia Assessment & Plan: hgba1c acceptable, minimize simple carbs. Increase exercise as tolerated.   Obesity due to excess calories, unspecified class, unspecified whether serious comorbidity present Assessment & Plan: Encouraged DASH or MIND diet, decrease po intake and increase exercise as tolerated. Needs 7-8 hours of sleep nightly. Avoid trans fats, eat small, frequent meals every 4-5 hours with lean proteins, complex carbs and healthy fats. Minimize simple carbs, high fat foods and processed foods    Depression with anxiety -     Drug Monitoring Panel [phone] , Urine  Urinary urgency Assessment & Plan: This is worsening and when he gets the urge he has only seconds before he needs to be in  the restroom. Is working with urology but the Flomax has not been greatly helpful.   Stage 3a chronic kidney disease (HCC) Assessment & Plan: Hydrate and monitor    Prostate cancer [facility]) Assessment & Plan: Patient continues to follow with urology and oncology and will likely end up with chronic treatments but they have not been established yet.    Obstruction of ureter, unspecified laterality Assessment & Plan: Is struggling with stenosis of penile head/meatus and is considering a circumcision. Now struggling with dysuria, he is describing almost a fissure with cutting pain. He sees Dr Arita Miss at [facility] and is going to reach out and discuss.    Sacral pain Assessment & Plan: A pressure type pain especially when he sits will proceed with a xray to further investigate.   Orders: -     DG Sacrum/Coccyx; Future  Dysuria -     Urinalysis -     Urine Culture; Future  Other orders -     FLUoxetine HCl; Take 1 tablet (20 mg total) by mouth 2 (two) times daily.  Dispense: 180 tablet; Refill: 1 -     Urinalysis, Routine w reflex microscopic    Assessment and Plan    Prostate Cancer Post-radiation therapy with persistent urinary urgency, difficulty initiating urination, and pelvic discomfort. No significant improvement with Flomax. -Continue Flomax as prescribed. -Collect urine sample for culture to rule out infection. -Consider referral to Urology for further evaluation.  Depression/Anxiety Patient reports feeling overwhelmed and low energy. Currently on Fluoxetine 20mg . -Increase Fluoxetine to 40mg  daily. -Consider neuropsychological testing to further evaluate cognitive function and mood disorder.  Chronic Pain Reports of hip, back, knee, and shoulder pain. -Consider  imaging if pain worsens.  General Health Maintenance -Order sacrum X-ray to evaluate for potential causes of pelvic discomfort. -Consider Tetanus and Shingles vaccinations. -Consider participation in  Gene Connects study for genetic testing. -Schedule follow-up in 10-12 weeks with labs. -Schedule 28-month comprehensive physical exam.         Danise Edge, MD

## 2022-12-10 LAB — DRUG MONITORING PANEL 376104, URINE
Alphahydroxyalprazolam: NEGATIVE ng/mL (ref <25)
Alphahydroxymidazolam: NEGATIVE ng/mL (ref <50)
Alphahydroxytriazolam: NEGATIVE ng/mL (ref <50)
Aminoclonazepam: NEGATIVE ng/mL (ref <25)
Amphetamines: NEGATIVE ng/mL (ref <500)
Barbiturates: NEGATIVE ng/mL (ref <300)
Benzodiazepines: POSITIVE ng/mL — AB (ref <100)
Cocaine Metabolite: NEGATIVE ng/mL (ref <150)
Desmethyltramadol: NEGATIVE ng/mL (ref <100)
Hydroxyethylflurazepam: NEGATIVE ng/mL (ref <50)
Lorazepam: 2396 ng/mL — ABNORMAL HIGH (ref <50)
Nordiazepam: NEGATIVE ng/mL (ref <50)
Opiates: NEGATIVE ng/mL (ref <100)
Oxazepam: NEGATIVE ng/mL (ref <50)
Oxycodone: NEGATIVE ng/mL (ref <100)
Temazepam: NEGATIVE ng/mL (ref <50)
Tramadol: NEGATIVE ng/mL (ref <100)

## 2022-12-10 LAB — DM TEMPLATE

## 2022-12-14 ENCOUNTER — Ambulatory Visit: Payer: Self-pay | Admitting: Licensed Clinical Social Worker

## 2022-12-14 ENCOUNTER — Other Ambulatory Visit: Payer: Self-pay | Admitting: Family

## 2022-12-14 ENCOUNTER — Encounter: Payer: Self-pay | Admitting: Family

## 2022-12-14 ENCOUNTER — Telehealth: Payer: Self-pay | Admitting: Family Medicine

## 2022-12-14 ENCOUNTER — Other Ambulatory Visit: Payer: Medicare Other

## 2022-12-14 DIAGNOSIS — R300 Dysuria: Secondary | ICD-10-CM | POA: Diagnosis not present

## 2022-12-14 MED ORDER — SULFAMETHOXAZOLE-TRIMETHOPRIM 800-160 MG PO TABS: 1.0000 | ORAL_TABLET | Freq: Two times a day (BID) | ORAL | 0 refills | Status: DC

## 2022-12-14 NOTE — Patient Outreach (Signed)
  Care Coordination   12/14/2022 Name: David [name] MRN: [id number] DOB: 1945/08/04   Care Coordination Outreach Attempts:  An unsuccessful telephone outreach was attempted today to offer the patient information about available care coordination services.  Follow Up Plan:  Additional outreach attempts will be made to offer the patient care coordination information and services.   Encounter Outcome:  No Answer   Care Coordination Interventions:  No, not indicated    Kelton Pillar.Ameliya Nicotra MSW, LCSW Licensed Visual merchandiser [facility] Care Management [phone]

## 2022-12-14 NOTE — Telephone Encounter (Signed)
Initial Comment Caller states he has urinary problems, it's burning, he is waiting on a call from his provider. Translation No Disp. Time [name] Time) Disposition Final User 12/14/2022 9:49:52 AM Attempt made - message left Alexander Mt, RN, [name] 12/14/2022 10:06:18 AM FINAL ATTEMPT MADE - message left Yes Alexander Mt RN, [name] Final Disposition 12/14/2022 10:06:18 AM FINAL ATTEMPT MADE - message left Yes Alexander Mt, RN, [name]

## 2022-12-14 NOTE — Progress Notes (Signed)
Specimen provided

## 2022-12-14 NOTE — Telephone Encounter (Signed)
Patient called and states still is waiting for antibiotics that was suppose to be prescribed by provider for UTI. Please call for further information

## 2022-12-14 NOTE — Telephone Encounter (Signed)
Pt came to give another specimen and said he is really hurting and would like to know if Dr. Abner Greenspan could call something in for him. Pt also would like a referral to the urologist in the building as previously discussed with DR. Abner Greenspan. Please call hm to advise of referral and when the medication is sent in for him.

## 2022-12-14 NOTE — Telephone Encounter (Signed)
Patient would like to know when antibiotic will be called in

## 2022-12-15 LAB — URINE CULTURE
MICRO NUMBER:: 15796364
Result:: NO GROWTH
SPECIMEN QUALITY:: ADEQUATE

## 2022-12-15 NOTE — Telephone Encounter (Signed)
Called patient to let him know antibiotic has been called in

## 2022-12-16 ENCOUNTER — Other Ambulatory Visit: Payer: Self-pay | Admitting: Emergency Medicine

## 2022-12-16 DIAGNOSIS — R3915 Urgency of urination: Secondary | ICD-10-CM

## 2022-12-17 ENCOUNTER — Ambulatory Visit: Payer: Medicare Other | Admitting: Urology

## 2022-12-17 ENCOUNTER — Encounter: Payer: Self-pay | Admitting: Urology

## 2022-12-17 VITALS — BP 142/75 | HR 54 | Ht 71.0 in | Wt 230.0 lb

## 2022-12-17 DIAGNOSIS — R399 Unspecified symptoms and signs involving the genitourinary system: Secondary | ICD-10-CM | POA: Diagnosis not present

## 2022-12-17 DIAGNOSIS — C61 Malignant neoplasm of prostate: Secondary | ICD-10-CM | POA: Diagnosis not present

## 2022-12-17 DIAGNOSIS — R3129 Other microscopic hematuria: Secondary | ICD-10-CM | POA: Diagnosis not present

## 2022-12-17 DIAGNOSIS — N481 Balanitis: Secondary | ICD-10-CM

## 2022-12-17 DIAGNOSIS — K5900 Constipation, unspecified: Secondary | ICD-10-CM | POA: Diagnosis not present

## 2022-12-17 LAB — URINALYSIS, ROUTINE W REFLEX MICROSCOPIC
Bilirubin, UA: NEGATIVE
Glucose, UA: NEGATIVE
Ketones, UA: NEGATIVE
Leukocytes,UA: NEGATIVE
Nitrite, UA: NEGATIVE
Protein,UA: NEGATIVE
Specific Gravity, UA: 1.02 (ref 1.005–1.030)
Urobilinogen, Ur: 0.2 mg/dL (ref 0.2–1.0)
pH, UA: 6.5 (ref 5.0–7.5)

## 2022-12-17 LAB — MICROSCOPIC EXAMINATION: RBC, Urine: >30 /[HPF] — AB (ref 0–2)

## 2022-12-17 MED ORDER — TAMSULOSIN HCL 0.4 MG PO CAPS: 0.4000 mg | ORAL_CAPSULE | Freq: Every day | ORAL | 0 refills | Status: AC

## 2022-12-17 MED ORDER — CLOTRIMAZOLE-BETAMETHASONE 1-0.05 % EX CREA: 1.0000 | TOPICAL_CREAM | Freq: Two times a day (BID) | CUTANEOUS | 1 refills | Status: DC

## 2022-12-17 NOTE — Progress Notes (Addendum)
Assessment: 1. Prostate cancer (HCC)   2. Lower urinary tract symptoms (LUTS)   3. Balanitis   4. Constipation, unspecified constipation type   5. Microscopic hematuria      Plan: Today I had an extensive discussion with the patient regarding his multiple urologic issues. Today I went over a bowel regimen with him including regular stool softeners and MiraLAX as needed.  I have recommended Lotrisone cream for his balanitis-he wants to hold off on circumcision  In terms of his lower urinary tract symptoms we discussed the issues of worsening urinary tract symptoms following radiation therapy and its natural history and treatment options. Will begin trial of tamsulosin 0.4 mg daily Patient will return in 2 to 3 months for recheck and cystoscopy given his significant microscopic hematuria  PSA on FU  Will consult with Dr. Kathrynn Running duration of ADT--  ADDENDUM:  DR MANNING REQUESTS MIN OF ADT WILL SET UP PATIENT FOR FU [facility] EARLY JAN AND PSA AT THAT TIME.  Chief Complaint: PROSTATE CANCER/LUTS  History of Present Illness:  David [name] is a 77 y.o. male who is seen in consultation from [name], MD to establish ongoing urologic care.  He has been followed by Dr. Arita Miss at [facility] the past several yrs.  Complex prior urologic history.  Patient states he would like to transfer care here because he lives close by and it is more convenient.  Today I reviewed his extensive prior urologic records.  Patient has a long history of recurrent stone disease complicated by development of right ureteral stricture and nonfunctioning right kidney status post robotic assisted laparoscopic nephrectomy in 2020.  Patient also has a history of prostate cancer initially diagnosed 03/2019--Gleason 3+4 =7 with pretreatment PSA of 5.53. Patient underwent LDR implant 07/2019. Nadir PSA was 2.46 January 2022.  His PSA continued to rise and a PSMA PET scan 10/2020 showed prostatic uptake only.   Biopsy however was negative at that time.  Follow-up PSA rose again to 4.912 2023.  Repeat PET scan at that time showed persistent tumor in the prostate and on biopsy Gleason 4+4 disease was present in all 12 cores. PSA at that time 7.1 05/2022.  Patient started on ADT with Eligard 07/15/2022  Completed salvage RT 11/08/2022  Patient currently has been bothered by worsening lower urinary tract symptoms since completion of radiation therapy he reports decreased force of stream as well as frequency urgency and occasional urge incontinence.  Urinalysis is negative except for microscopic hematuria. Patient also has a history of balanitis and is considered circumcision.        Past Medical History:  Past Medical History:  Diagnosis Date   Adenoma of right adrenal gland 07/11/2002   2.4cm , noted on CT ABD   Anxiety    Arthritis of both knees 03/26/2016   Astigmatism    Back pain    BCC (basal cell carcinoma of skin)    under right eye and right ear   Blood transfusion without reported diagnosis    Cataract 09/29/2016   Depression    Diverticulitis 2009   Diverticulosis    Family history of breast cancer    Family history of colon cancer    Fatty liver    GERD (gastroesophageal reflux disease)    Gout    Hearing loss of both ears 03/26/2016   History of atrial fibrillation    x 2 none since 2020   History of chronic prostatitis    started at  age 71   History of kidney stones    Hx of adenomatous colonic polyps 02/16/2022   3 diminutive - no recall   Hyperglycemia    Hyperlipidemia    Hypertension    Incomplete right bundle branch block (RBBB)    Internal hemorrhoids    Kidney lesion 06/07/2015   Right midportion, 1.1x1.1 cm hyperechoic, noted on [place] ABD   LAFB (left anterior fascicular block)    Lipoma of axilla 09/2016   Liver lesion, right lobe 06/07/2015   2.5x2.4x2.4 cm hypoechoic lesion posterior aspect, noted on [place] ABD   Low back pain 03/26/2016   LVH (left  ventricular hypertrophy) 08/06/2017   Moderate, Noted on ECHO   Medicare annual wellness visit, subsequent 03/12/2014   OA (osteoarthritis)    Back, Hands, Neck   Obesity 11/25/2007   Qualifier: Diagnosis of  By: Kriste Basque MD, Lonzo Cloud    Other malaise and fatigue 03/13/2013   Premature ventricular complex    Prostate cancer (HCC) dx 2021   Renal insufficiency    Kidney removed right   Scoliosis    Upper thoracic and lumbar   Sinus bradycardia 08/06/2017   Noted on EKG   Vitamin D deficiency    resolved    Past Surgical History:  Past Surgical History:  Procedure Laterality Date   CARDIOVERSION  07/31/2017   CATARACT EXTRACTION, BILATERAL     COLON SURGERY  2009   segmental sigmoid resection   COLONOSCOPY     COLONOSCOPY WITH PROPOFOL     CYSTOSCOPY N/A 07/28/2019   Procedure: CYSTOSCOPY FLEXIBLE;  Surgeon: Malen Gauze, MD;  Location: [facility];  Service: Urology;  Laterality: N/A;   CYSTOSCOPY W/ RETROGRADES Right 06/07/2018   Procedure: CYSTOSCOPY WITH RETROGRADE PYELOGRAM, uphrostogram;  Surgeon: Malen Gauze, MD;  Location: [facility];  Service: Urology;  Laterality: Right;   CYSTOSCOPY WITH URETEROSCOPY AND STENT PLACEMENT Right 02/28/2018   Procedure: CYSTOSCOPY WITH URETEROSCOPY Iverson Alamin;  Surgeon: Ihor Gully, MD;  Location[facility];  Service: Urology;  Laterality: Right;   CYSTOSCOPY/URETEROSCOPY/HOLMIUM LASER/STENT PLACEMENT Right 11/29/2017   Procedure: CYSTOSCOPY/RETROGRADE/URETEROSCOPY/HOLMIUM LASER/STENT PLACEMENT;  Surgeon: Ihor Gully, MD;  Location: [facility];  Service: Urology;  Laterality: Right;   EYE SURGERY Bilateral 01/12/2017   cataract removal   history of blood tranfusion  age 25   HOLMIUM LASER APPLICATION Right 09/24/2017   Procedure: HOLMIUM LASER APPLICATION;  Surgeon: Ihor Gully, MD;  Location: [facility];  Service: Urology;  Laterality: Right;   IR BALLOON DILATION URETERAL  STRICTURE RIGHT  03/14/2018   IR NEPHRO TUBE REMOV/FL  03/17/2018   IR NEPHROSTOGRAM RIGHT THRU EXISTING ACCESS  03/17/2018   IR NEPHROSTOMY EXCHANGE RIGHT  03/14/2018   IR NEPHROSTOMY EXCHANGE RIGHT  06/21/2018   IR NEPHROSTOMY PLACEMENT RIGHT  03/02/2018   IR NEPHROSTOMY PLACEMENT RIGHT  04/20/2018   IR URETERAL STENT PLACEMENT EXISTING ACCESS RIGHT  03/14/2018   NOSE SURGERY     Submucous resection age 17   NUCLEAR STRESS TEST  06/03/2009   RADIOACTIVE SEED IMPLANT N/A 07/28/2019   Procedure: RADIOACTIVE SEED IMPLANT/BRACHYTHERAPY IMPLANT;  Surgeon: Malen Gauze, MD;  Location: [facility];  Service: Urology;  Laterality: N/A;  90 MINS   ROBOT ASSISTED LAPAROSCOPIC NEPHRECTOMY Right 08/04/2018   Procedure: XI ROBOTIC ASSISTED LAPAROSCOPIC NEPHRECTOMY;  Surgeon: Malen Gauze, MD;  Location: [facility];  Service: Urology;  Laterality: Right;  3 HRS   ROBOT ASSISTED PYELOPLASTY Right 06/07/2018  Procedure: attempted XI ROBOTIC ASSISTED PYELOPLASTY, lysis of adhesions;  Surgeon: Malen Gauze, MD;  Location: [facility];  Service: Urology;  Laterality: Right;  3 HRS   SKIN BIOPSY     SPACE OAR INSTILLATION N/A 07/28/2019   Procedure: SPACE OAR INSTILLATION;  Surgeon: Malen Gauze, MD;  Location: [facility];  Service: Urology;  Laterality: N/A;   URETEROSCOPY WITH HOLMIUM LASER LITHOTRIPSY Right 09/24/2017   Procedure: CYSTOSCOPY, URETEROSCOPY WITH HOLMIUM LASER LITHOTRIPSY, STENT PLACEMENT;  Surgeon: Ihor Gully, MD;  Location: [facility];  Service: Urology;  Laterality: Right;    Allergies:  Allergies  Allergen Reactions   Cefaclor Hives   Cephalosporins Hives   Penicillins Rash    Mild maculopapular rash Has patient had a PCN reaction causing immediate rash, facial/tongue/throat swelling, SOB or lightheadedness with hypotension: No Has patient had a PCN reaction causing severe rash involving mucus membranes or skin  necrosis: No Has patient had a PCN reaction that required hospitalization: No Has patient had a PCN reaction occurring within the last 10 years: No If all of the above answers are "NO", then may proceed with Cephalosporin use.     Family History:  Family History  Problem Relation Age of Onset   Heart disease Mother    Hypertension Mother    Stroke Mother    Colon cancer Mother 2   Breast cancer Mother 62   Heart disease Father        pacemaker   Aortic aneurysm Father    Hypertension Father    Lung cancer Father 66   Heart disease Sister    Atrial fibrillation Sister    Obesity Sister    Sleep apnea Sister    Heart attack Brother    Other Brother        muscle disease   Arthritis Brother    Stroke Brother    Atrial fibrillation Brother    Heart attack Brother    Atrial fibrillation Brother    Diabetes Brother    Atrial fibrillation Brother    Heart attack Brother    Hypertension Brother    Hyperlipidemia Brother    Heart attack Brother    Other Brother        heart valve operation   Atrial fibrillation Brother    Heart attack Maternal Grandmother    Diabetes Maternal Grandmother    Cancer Maternal Grandfather 44       hodgin's lymphoma   Heart attack Paternal Grandmother    Anxiety disorder Paternal Grandmother    Pneumonia Paternal Grandfather    Liver cancer Nephew 2       great nephew; d. 7   Lymphoma Niece 70   Esophageal cancer Neg Hx    Rectal cancer Neg Hx    Stomach cancer Neg Hx    Colon polyps Neg Hx    Pancreatic cancer Neg Hx     Social History:  Social History   Tobacco Use   Smoking status: Former    Current packs/day: 0.00    Average packs/day: 1 pack/day for 6.0 years (6.0 ttl pk-yrs)    Types: Cigarettes    Start date: 01/11/1969    Quit date: 01/12/1969    Years since quitting: 53.9   Smokeless tobacco: Never   Tobacco comments:    1965-1971  Vaping Use   Vaping status: Never Used  Substance Use Topics   Alcohol use: Not  Currently    Comment: 2 whiskey drinks per  day   Drug use: Never    Review of symptoms:  Constitutional:  Negative for unexplained weight loss, night sweats, fever, chills ENT:  Negative for nose bleeds, sinus pain, painful swallowing CV:  Negative for chest pain, shortness of breath, exercise intolerance, palpitations, loss of consciousness Resp:  Negative for cough, wheezing, shortness of breath GI:  Negative for nausea, vomiting, diarrhea, bloody stools GU:  Positives noted in HPI; otherwise negative for gross hematuria, dysuria, urinary incontinence Neuro:  Negative for seizures, poor balance, limb weakness, slurred speech Psych:  Negative for lack of energy, depression, anxiety Endocrine:  Negative for polydipsia, polyuria, symptoms of hypoglycemia (dizziness, hunger, sweating) Hematologic:  Negative for anemia, purpura, petechia, prolonged or excessive bleeding, use of anticoagulants  Allergic:  Negative for difficulty breathing or choking as a result of exposure to anything; no shellfish allergy; no allergic response (rash/itch) to materials, foods  Physical exam: BP (!) 142/75   Pulse (!) 54   Ht 5\' 11"  (1.803 m)   Wt 230 lb (104.3 kg)   BMI 32.08 kg/m  GENERAL APPEARANCE:  Well appearing, well developed, well nourished, NAD  GU: Uncircumcised phallus with mild phimosis.  I am able to fully retract the foreskin and there is some mild erythema  Results: Negative except for significant microscopic hematuria

## 2022-12-18 ENCOUNTER — Ambulatory Visit (INDEPENDENT_AMBULATORY_CARE_PROVIDER_SITE_OTHER): Payer: Medicare Other | Admitting: Internal Medicine

## 2022-12-18 ENCOUNTER — Encounter: Payer: Self-pay | Admitting: Internal Medicine

## 2022-12-18 ENCOUNTER — Telehealth: Payer: Self-pay

## 2022-12-18 VITALS — BP 126/70 | HR 60 | Ht 71.0 in | Wt 228.5 lb

## 2022-12-18 DIAGNOSIS — R300 Dysuria: Secondary | ICD-10-CM | POA: Diagnosis not present

## 2022-12-18 DIAGNOSIS — R1032 Left lower quadrant pain: Secondary | ICD-10-CM | POA: Diagnosis not present

## 2022-12-18 DIAGNOSIS — R1031 Right lower quadrant pain: Secondary | ICD-10-CM | POA: Diagnosis not present

## 2022-12-18 DIAGNOSIS — N481 Balanitis: Secondary | ICD-10-CM | POA: Diagnosis not present

## 2022-12-18 DIAGNOSIS — Z8546 Personal history of malignant neoplasm of prostate: Secondary | ICD-10-CM | POA: Diagnosis not present

## 2022-12-18 DIAGNOSIS — R194 Change in bowel habit: Secondary | ICD-10-CM | POA: Diagnosis not present

## 2022-12-18 DIAGNOSIS — T66XXXD Radiation sickness, unspecified, subsequent encounter: Secondary | ICD-10-CM | POA: Diagnosis not present

## 2022-12-18 DIAGNOSIS — N471 Phimosis: Secondary | ICD-10-CM | POA: Diagnosis not present

## 2022-12-18 NOTE — Telephone Encounter (Signed)
-----   Message from Joline Maxcy sent at 12/18/2022  1:55 PM EST ----- Regarding: ELIGARD Please call him and let him know that I contacted Dr. Kathrynn Running who did his radiation and he recommends that he have a minimum of 26mo of eligard. His first injection was July 3,2024 so he will be due for next injection early Jan 2025.   Please set him up for an appt to see me then.  He was sent out with appt in March.  Cancel that. thanks

## 2022-12-18 NOTE — Progress Notes (Signed)
David [name] 77 y.o. 26-Jun-1945 [id number]  Assessment & Plan:   Encounter Diagnoses  Name Primary?   Altered bowel habits Yes   Adverse effect of radiation, subsequent encounter    Bilateral lower abdominal pain    He is improving.  I suspect that he had edematous areas or inflammation from radiation (seed implants).  He is significantly improved and not requiring MiraLAX further.  I am not sure why he has abdominal discomfort but workup is negative.  No plans for additional testing he will follow-up as needed.    Subjective:   Chief Complaint:  HPI Discussed the use of AI scribe software for clinical note transcription with the patient, who gave verbal consent to proceed.  History of Present Illness   David [name], a patient with a recent history of prostate cancer treated with radiation therapy, presents with complaints of painful urination and defecation. The pain onset was noted in the third week of radiation treatment, initially described as severe, akin to a scalpel cutting down the middle. The pain has since improved significantly. The patient also reports episodes of constipation, with instances where defecation was not possible.  The patient's radiation treatment concluded three weeks prior to this consultation. Post-treatment, the patient was informed that the recovery period would be approximately the same length as the treatment duration; however, his recovery has taken longer than expected.  The patient also reports right-sided and left-sided abdominal discomfort, which he has self-managed by applying pressure. The discomfort is suspected to be related to bowel movement difficulties. The patient has a history of a right kidney removal.  The patient's symptoms have been gradually improving since the cessation of radiation therapy. He was previously on a daily regimen of Miralax, which he has since discontinued due to symptom improvement.      Was also in the  emergency department 12/02/2022.  Note reviewed.  The patient is started on MiraLAX and was actually feeling somewhat better and decided no more workup.  He had had a CT of the abdomen and pelvis without contrast on October 27 when he went to the emergency department also.  It was negative, had a left renal calculus a 2 mm, stable 2.5 cm right adrenal nodule and diverticulosis but no diverticulitis.  Complaining of right-sided flank pain at that time.  In addition he has left-sided pain as well these pains generally occur with palpation only.  The patient makes it clear he is much better at this time he just wanted to get evaluated and wondered if he needed another colonoscopy.  Colonoscopy in February of this year with diverticulosis and 3 diminutive adenomas and no recall planned given age and findings.  Note he also saw Dr. Margo Aye of radiology yesterday who thought that he just needed to give things more time as well.  Lab Results  Component Value Date   WBC 3.9 (L) 12/01/2022   HGB 11.6 (L) 12/01/2022   HCT 34.6 (L) 12/01/2022   MCV 97.7 12/01/2022   PLT 156.0 12/01/2022   Lab Results  Component Value Date   IRON 84 12/01/2022   TIBC 227 (L) 12/01/2022   FERRITIN 353 12/01/2022   Lab Results  Component Value Date   NA 138 11/29/2022   CL 106 11/29/2022   K 3.9 11/29/2022   CO2 24 11/29/2022   BUN 14 11/29/2022   CREATININE 1.47 (H) 11/29/2022   GFRNONAA 49 (L) 11/29/2022   CALCIUM 10.3 11/29/2022   PHOS 3.7 02/23/2014  ALBUMIN 3.6 11/29/2022   GLUCOSE 99 11/29/2022    Allergies  Allergen Reactions   Cefaclor Hives   Cephalosporins Hives   Penicillins Rash    Mild maculopapular rash Has patient had a PCN reaction causing immediate rash, facial/tongue/throat swelling, SOB or lightheadedness with hypotension: No Has patient had a PCN reaction causing severe rash involving mucus membranes or skin necrosis: No Has patient had a PCN reaction that required hospitalization: No Has  patient had a PCN reaction occurring within the last 10 years: No If all of the above answers are "NO", then may proceed with Cephalosporin use.    Current Meds  Medication Sig   acetaminophen (TYLENOL) 325 MG tablet Take 2 tablets (650 mg total) by mouth every 6 (six) hours as needed.   allopurinol (ZYLOPRIM) 100 MG tablet TAKE 1/2 TABLET BY MOUTH DAILY   ascorbic acid (VITAMIN C) 1000 MG tablet Take by mouth.   atenolol (TENORMIN) 50 MG tablet Take 1 tablet (50 mg total) by mouth 2 (two) times daily.   atorvastatin (LIPITOR) 10 MG tablet Take 1 tablet (10 mg total) by mouth every morning.   Cholecalciferol (VITAMIN D-1000 MAX ST) 25 MCG (1000 UT) tablet Take by mouth.   clotrimazole-betamethasone (LOTRISONE) cream Apply 1 Application topically 2 (two) times daily.   colchicine 0.6 MG tablet For gout flares, take 1 tab po once then repeat in 1 hour as needed if pain persists up to a total of 3 doses   ELIQUIS 5 MG TABS tablet TAKE 1 TABLET BY MOUTH 2 TIMES A DAY   FLUoxetine (PROZAC) 20 MG tablet Take 1 tablet (20 mg total) by mouth 2 (two) times daily.   fluticasone (FLONASE) 50 MCG/ACT nasal spray Place 2 sprays into both nostrils daily. (Patient taking differently: Place 2 sprays into both nostrils as needed.)   ketoconazole (NIZORAL) 2 % cream Apply 1 Application topically daily.   LORazepam (ATIVAN) 1 MG tablet TAKE ONE TABLET BY MOUTH EVERY 8 HOURS AS NEEDED FOR ANXIETY   omeprazole (PRILOSEC) 20 MG capsule Take 20 mg by mouth daily as needed (acid reflux).    tamsulosin (FLOMAX) 0.4 MG CAPS capsule Take 1 capsule (0.4 mg total) by mouth daily after supper.   triamcinolone (KENALOG) 0.025 % cream    Past Medical History:  Diagnosis Date   Adenoma of right adrenal gland 07/11/2002   2.4cm , noted on CT ABD   Anxiety    Arthritis of both knees 03/26/2016   Astigmatism    Back pain    BCC (basal cell carcinoma of skin)    under right eye and right ear   Blood transfusion without  reported diagnosis    Cataract 09/29/2016   Depression    Diverticulitis 2009   Diverticulosis    Family history of breast cancer    Family history of colon cancer    Fatty liver    GERD (gastroesophageal reflux disease)    Gout    Hearing loss of both ears 03/26/2016   History of atrial fibrillation    x 2 none since 2020   History of chronic prostatitis    started at age 19   History of kidney stones    Hx of adenomatous colonic polyps 02/16/2022   3 diminutive - no recall   Hyperglycemia    Hyperlipidemia    Hypertension    Incomplete right bundle branch block (RBBB)    Internal hemorrhoids    Kidney lesion 06/07/2015   Right midportion,  1.1x1.1 cm hyperechoic, noted on [place] ABD   LAFB (left anterior fascicular block)    Lipoma of axilla 09/2016   Liver lesion, right lobe 06/07/2015   2.5x2.4x2.4 cm hypoechoic lesion posterior aspect, noted on [place] ABD   Low back pain 03/26/2016   LVH (left ventricular hypertrophy) 08/06/2017   Moderate, Noted on ECHO   Medicare annual wellness visit, subsequent 03/12/2014   OA (osteoarthritis)    Back, Hands, Neck   Obesity 11/25/2007   Qualifier: Diagnosis of  By: Kriste Basque MD, Lonzo Cloud    Other malaise and fatigue 03/13/2013   Premature ventricular complex    Prostate cancer (HCC) dx 2021   Renal insufficiency    Kidney removed right   Scoliosis    Upper thoracic and lumbar   Sinus bradycardia 08/06/2017   Noted on EKG   Vitamin D deficiency    resolved   Past Surgical History:  Procedure Laterality Date   CARDIOVERSION  07/31/2017   CATARACT EXTRACTION, BILATERAL     COLON SURGERY  2009   segmental sigmoid resection   COLONOSCOPY     COLONOSCOPY WITH PROPOFOL     CYSTOSCOPY N/A 07/28/2019   Procedure: CYSTOSCOPY FLEXIBLE;  Surgeon: Malen Gauze, MD;  Location[facility];  Service: Urology;  Laterality: N/A;   CYSTOSCOPY W/ RETROGRADES Right 06/07/2018   Procedure: CYSTOSCOPY WITH RETROGRADE PYELOGRAM,  uphrostogram;  Surgeon: Malen Gauze, MD;  Location: [facility];  Service: Urology;  Laterality: Right;   CYSTOSCOPY WITH URETEROSCOPY AND STENT PLACEMENT Right 02/28/2018   Procedure: CYSTOSCOPY WITH URETEROSCOPY Iverson Alamin;  Surgeon: Ihor Gully, MD;  Location: [facility];  Service: Urology;  Laterality: Right;   CYSTOSCOPY/URETEROSCOPY/HOLMIUM LASER/STENT PLACEMENT Right 11/29/2017   Procedure: CYSTOSCOPY/RETROGRADE/URETEROSCOPY/HOLMIUM LASER/STENT PLACEMENT;  Surgeon: Ihor Gully, MD;  Location: [facility];  Service: Urology;  Laterality: Right;   EYE SURGERY Bilateral 01/12/2017   cataract removal   history of blood tranfusion  age 28   HOLMIUM LASER APPLICATION Right 09/24/2017   Procedure: HOLMIUM LASER APPLICATION;  Surgeon: Ihor Gully, MD;  Location: [facility];  Service: Urology;  Laterality: Right;   IR BALLOON DILATION URETERAL STRICTURE RIGHT  03/14/2018   IR NEPHRO TUBE REMOV/FL  03/17/2018   IR NEPHROSTOGRAM RIGHT THRU EXISTING ACCESS  03/17/2018   IR NEPHROSTOMY EXCHANGE RIGHT  03/14/2018   IR NEPHROSTOMY EXCHANGE RIGHT  06/21/2018   IR NEPHROSTOMY PLACEMENT RIGHT  03/02/2018   IR NEPHROSTOMY PLACEMENT RIGHT  04/20/2018   IR URETERAL STENT PLACEMENT EXISTING ACCESS RIGHT  03/14/2018   NOSE SURGERY     Submucous resection age 52   NUCLEAR STRESS TEST  06/03/2009   RADIOACTIVE SEED IMPLANT N/A 07/28/2019   Procedure: RADIOACTIVE SEED IMPLANT/BRACHYTHERAPY IMPLANT;  Surgeon: Malen Gauze, MD;  Location: [facility];  Service: Urology;  Laterality: N/A;  90 MINS   ROBOT ASSISTED LAPAROSCOPIC NEPHRECTOMY Right 08/04/2018   Procedure: XI ROBOTIC ASSISTED LAPAROSCOPIC NEPHRECTOMY;  Surgeon: Malen Gauze, MD;  Location: [facility];  Service: Urology;  Laterality: Right;  3 HRS   ROBOT ASSISTED PYELOPLASTY Right 06/07/2018   Procedure: attempted XI ROBOTIC ASSISTED PYELOPLASTY, lysis of adhesions;  Surgeon: Malen Gauze, MD;  Location: [facility];  Service: Urology;  Laterality: Right;  3 HRS   SKIN BIOPSY     SPACE OAR INSTILLATION N/A 07/28/2019   Procedure: SPACE OAR INSTILLATION;  Surgeon: Malen Gauze, MD;  Location[facility];  Service: Urology;  Laterality: N/A;   URETEROSCOPY WITH HOLMIUM LASER LITHOTRIPSY Right 09/24/2017   Procedure: CYSTOSCOPY, URETEROSCOPY WITH HOLMIUM LASER LITHOTRIPSY, STENT PLACEMENT;  Surgeon: Ihor Gully, MD;  Location: [facility];  Service: Urology;  Laterality: Right;   Social History   Social History Narrative   The patient is married for the second time, he has 3 sons.   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   2 alcoholic beverages most days.   1 caffeinated beverage daily   No drug use no current tobacco use he is a prior smoker   12/24/2016   family history includes Anxiety disorder in his paternal grandmother; Aortic aneurysm in his father; Arthritis in his brother; Atrial fibrillation in his brother, brother, brother, brother, and sister; Breast cancer (age of onset: 32) in his mother; Cancer (age of onset: 56) in his maternal grandfather; Colon cancer (age of onset: 40) in his mother; Diabetes in his brother and maternal grandmother; Heart attack in his brother, brother, brother, brother, maternal grandmother, and paternal grandmother; Heart disease in his father, mother, and sister; Hyperlipidemia in his brother; Hypertension in his brother, father, and mother; Liver cancer (age of onset: 2) in his nephew; Lung cancer (age of onset: 30) in his father; Lymphoma (age of onset: 8) in his niece; Obesity in his sister; Other in his brother and brother; Pneumonia in his paternal grandfather; Sleep apnea in his sister; Stroke in his brother and mother.   Review of Systems  As per HPI Objective:   Physical Exam BP 126/70 (BP Location: Left Arm, Patient Position: Sitting, Cuff Size: Normal)   Pulse 60   Ht 5\' 11"   (1.803 m)   Wt 228 lb 8 oz (103.6 kg)   BMI 31.87 kg/m   Physical Exam   ABDOMEN: Bowel sounds active. No abdominal wall tenderness on palpation.  No tenderness or abdominal pain with straight leg raise (negative Carnett's sign)

## 2022-12-18 NOTE — Telephone Encounter (Signed)
-----   Message from Joline Maxcy sent at 12/18/2022  1:59 PM EST ----- Regarding: fyi Probably should talk to wife ----- Message ----- From: Noel Christmas, MD Sent: 12/18/2022  12:01 PM EST To: Marcello Fennel, PA-C; Joline Maxcy, MD; #  Hi Ashlyn,  This patient came in to see me today. He also saw Dr. Margo Aye [facility]) and has been to ED multiple times. Dysuria improving but is asking to follow up with radiation oncology. I think he truly has memory issues and is unclear what is going on. My office has had a very hard time getting in touch with him. He calls in and we return call with a plan but it seems lost in translation.  Today, we discussed: 1. Repeating PSA in 3 months 2. Phimosis/balanitis 3. Dysuria/microscopic hematuria 4. Radiation treatment and who Dr. Kathrynn Running is  Wanted to keep everyone in the loop. His wife said it would be best to call her Ginger Mowbray [phone]  Thanks! MaryEllen

## 2022-12-18 NOTE — Telephone Encounter (Signed)
Spoke with spouse, she did not know that the pt had an Eligard in July and was confused at first herself. After explaining things to spouse, she verbalized understanding and rescheduled appt to January 2025 with Dr. Margo Aye.

## 2022-12-18 NOTE — Patient Instructions (Signed)
_______________________________________________________  If your blood pressure at your visit was 140/90 or greater, please contact your primary care physician to follow up on this.  _______________________________________________________  If you are age 77 or older, your body mass index should be between 23-30. Your Body mass index is 31.87 kg/m. If this is out of the aforementioned range listed, please consider follow up with your Primary Care Provider.  If you are age 59 or younger, your body mass index should be between 19-25. Your Body mass index is 31.87 kg/m. If this is out of the aformentioned range listed, please consider follow up with your Primary Care Provider.   ________________________________________________________  The [place] [facility] providers would like to encourage you to use [facility] to communicate with providers for non-urgent requests or questions.  Due to long hold times on the telephone, sending your provider a message by [facility] may be a faster and more efficient way to get a response.  Please allow 48 business hours for a response.  Please remember that this is for non-urgent requests.  _______________________________________________________  I appreciate the opportunity to care for you. Stan Head, MD, [facility]

## 2022-12-21 ENCOUNTER — Ambulatory Visit: Payer: Self-pay | Admitting: Licensed Clinical Social Worker

## 2022-12-21 NOTE — Patient Outreach (Signed)
  Care Coordination   Follow Up Visit Note   12/21/2022 Name: David [name] MRN: [id number] DOB: 15-Aug-1945  David [name] is a 77 y.o. year old male who sees [name], MD for primary care. I spoke with  David [name] by phone today.  What matters to the patients health and wellness today?  Patient has challenges in managing anxiety or depression  Issues    Goals Addressed             This Visit's Progress    Patient has challenges in managing anxiety or depression issues       Interventions: LCSW spoke via phone today with client about client needs Discussed client support . He said he has support from his spouse and from his children Discussed needs of client's wife Discussed transport needs of client. He said he has no transport needs. He drives to and from his medical appointments Discussed client support with PCP, Dr. Danise Edge Discussed client history of receiving cancer treatments at [facility]. He said he had received 40 cancer treatments at [facility]. He said he is appreciative of staff at [facility] and for their support while he was receiving his cancer treatments Discussed sleep challenges. Client said he has reduced sleep Reviewed pain issues. Client spoke of arthritis pain  Reviewed medication procurement for client Discussed program support with RN, LCSW, Pharmacist Client spoke of challenges in urination. He said he sees urologist as scheduled. He sees PCP as scheduled. Provided counseling support Used Active Listening Techniques to allow client to share feelings Encouraged client to call LCSW as needed for SW support at [phone] [facility] client for phone call with LCSW today          SDOH assessments and interventions completed:  Yes  SDOH Interventions Today    Flowsheet Row Most Recent Value  SDOH Interventions   Depression Interventions/Treatment  Medication, Counseling  Physical  Activity Interventions Other (Comments)  Stress Interventions Provide Counseling  [stress in managing medical needs]        Care Coordination Interventions:  Yes, provided   Interventions Today    Flowsheet Row Most Recent Value  Chronic Disease   Chronic disease during today's visit Other  [spoke with client about client needs]  General Interventions   General Interventions Discussed/Reviewed General Interventions Discussed, Community Resources  Exercise Interventions   Exercise Discussed/Reviewed Physical Activity  Education Interventions   Education Provided Provided Education  Provided Verbal Education On [employer]  Mental Health Interventions   Mental Health Discussed/Reviewed Coping Strategies  [discussed coping skills of client. He has family support from his spouse and from his children. He likes exercising occasionally if he is able to do so]  Nutrition Interventions   Nutrition Discussed/Reviewed Nutrition Discussed  Pharmacy Interventions   Pharmacy Dicussed/Reviewed Pharmacy Topics Discussed       Follow up plan: Follow up call scheduled for 02/10/23 at 10:00 AM     Encounter Outcome:  Patient Visit Completed   Kelton Pillar.Nicole Defino MSW, LCSW Licensed Visual merchandiser [facility] Care Management [phone]

## 2022-12-21 NOTE — Patient Instructions (Signed)
Visit Information  Thank you for taking time to visit with me today. Please don't hesitate to contact me if I can be of assistance to you.   Following are the goals we discussed today:   Goals Addressed             This Visit's Progress    Patient has challenges in managing anxiety or depression issues       Interventions: LCSW spoke via phone today with client about client needs Discussed client support . He said he has support from his spouse and from his children Discussed needs of client's wife Discussed transport needs of client. He said he has no transport needs. He drives to and from his medical appointments Discussed client support with PCP, Dr. Danise Edge Discussed client history of receiving cancer treatments at [facility]. He said he had received 40 cancer treatments at [facility]. He said he is appreciative of staff at [facility] and for their support while he was receiving his cancer treatments Discussed sleep challenges. Client said he has reduced sleep Reviewed pain issues. Client spoke of arthritis pain  Reviewed medication procurement for client Discussed program support with RN, LCSW, Pharmacist Client spoke of challenges in urination. He said he sees urologist as scheduled. He sees PCP as scheduled. Provided counseling support Used Active Listening Techniques to allow client to share feelings Encouraged client to call LCSW as needed for SW support at [phone] [facility] client for phone call with LCSW today          Our next appointment is by telephone on 02/10/23 at 10:00 AM   Please call the care guide team at [phone] if you need to cancel or reschedule your appointment.   If you are experiencing a Mental Health or Behavioral Health Crisis or need someone to talk to, please go to [facility] [address] [place] [phone])   The patient verbalized  understanding of instructions, educational materials, and care plan provided today and DECLINED offer to receive copy of patient instructions, educational materials, and care plan.   The patient has been provided with contact information for the care management team and has been advised to call with any health related questions or concerns.   David [name].David [name] MSW, LCSW Licensed Visual merchandiser [facility] Care Management [phone]

## 2022-12-24 ENCOUNTER — Ambulatory Visit
Admission: RE | Admit: 2022-12-24 | Discharge: 2022-12-24 | Disposition: A | Discharge status: Home / Self Care | Payer: Medicare Other | Source: Ambulatory Visit | Attending: Cardiology | Admitting: Cardiology

## 2022-12-24 DIAGNOSIS — I7789 Other specified disorders of arteries and arterioles: Secondary | ICD-10-CM

## 2022-12-24 MED ORDER — IOPAMIDOL (ISOVUE-370) INJECTION 76%
500.0000 mL | Freq: Once | INTRAVENOUS | Status: AC | PRN
  Administered 2022-12-24: 75 mL via INTRAVENOUS

## 2022-12-25 ENCOUNTER — Encounter: Payer: Self-pay | Admitting: *Deleted

## 2022-12-28 ENCOUNTER — Other Ambulatory Visit: Payer: Self-pay

## 2022-12-28 DIAGNOSIS — I7789 Other specified disorders of arteries and arterioles: Secondary | ICD-10-CM

## 2022-12-29 ENCOUNTER — Encounter: Payer: Self-pay | Admitting: *Deleted

## 2022-12-31 ENCOUNTER — Other Ambulatory Visit: Payer: Self-pay | Admitting: *Deleted

## 2023-01-04 ENCOUNTER — Ambulatory Visit
Admission: RE | Admit: 2023-01-04 | Discharge: 2023-01-04 | Disposition: A | Discharge status: Home / Self Care | Payer: Medicare Other | Source: Ambulatory Visit | Attending: Neurology | Admitting: Neurology

## 2023-01-04 DIAGNOSIS — R4189 Other symptoms and signs involving cognitive functions and awareness: Secondary | ICD-10-CM

## 2023-01-28 ENCOUNTER — Telehealth: Payer: Self-pay | Admitting: Urology

## 2023-01-28 NOTE — Telephone Encounter (Signed)
Pt is having difficulty with his stomach after radiation treatment (has prostate cancer). His stomach has flared up and is having ongoing pain.

## 2023-02-03 ENCOUNTER — Ambulatory Visit: Payer: Medicare Other | Admitting: Urology

## 2023-02-03 ENCOUNTER — Encounter: Payer: Self-pay | Admitting: Urology

## 2023-02-03 VITALS — BP 178/79 | HR 56 | Ht 71.0 in | Wt 235.0 lb

## 2023-02-03 DIAGNOSIS — R399 Unspecified symptoms and signs involving the genitourinary system: Secondary | ICD-10-CM | POA: Diagnosis not present

## 2023-02-03 DIAGNOSIS — K5900 Constipation, unspecified: Secondary | ICD-10-CM

## 2023-02-03 DIAGNOSIS — R3129 Other microscopic hematuria: Secondary | ICD-10-CM

## 2023-02-03 DIAGNOSIS — C61 Malignant neoplasm of prostate: Secondary | ICD-10-CM

## 2023-02-03 DIAGNOSIS — N481 Balanitis: Secondary | ICD-10-CM

## 2023-02-03 MED ORDER — LEUPROLIDE ACETATE (6 MONTH) 45 MG ~~LOC~~ KIT
45.0000 mg | PACK | Freq: Once | SUBCUTANEOUS | Status: AC
  Administered 2023-02-03: 45 mg via SUBCUTANEOUS

## 2023-02-03 NOTE — Addendum Note (Signed)
Addended by: Carolin Coy on: 02/03/2023 02:34 PM   Modules accepted: Orders

## 2023-02-03 NOTE — Progress Notes (Signed)
Assessment: 1. Prostate cancer (HCC)   2. Lower urinary tract symptoms (LUTS)   3. Balanitis   4. Constipation, unspecified constipation type   5. Microscopic hematuria     Plan: Today I had a long and detailed discussion with the patient and his son David [name] who accompanied him today. Concerning his prostate cancer we plan on continuing with ADT hopefully through 24 months - 60-month minimum. 39-month Eligard to be administered today  Lower urinary tract symptoms remain stable on tamsulosin  His balanitis is currently mild.  He will continue to use Lotrisone cream as needed  Continue stool softeners and as needed MiraLAX for constipation  Microscopic hematuria is resolved  Chief Complaint: Chief Complaint  Patient presents with   Cysto    HPI: David [name] is a 78 y.o. male who presents for continued evaluation of a number of urologic issues. Please see my note 12/17/2022 to time of initial visit for detailed history and exam. Patient is accompanied today by his son David [name].  The patient does have some memory issues and I have encouraged that his son come with him at the time of his visits.  Patient has previously been followed by Dr. Arita [name] at [facility] but has transferred his care here due to convenience as he lives quite nearby.  Patient reports that he is doing fairly well.  He still has significant lower urinary tract symptoms but they may be slightly improved.  Current IPSS = 16 he continues to take tamsulosin.  Patient also uses Lotrisone cream as needed for balanitis.  He also has had some groin candidiasis.  He is scheduled to see dermatology later next month.  Urinalysis today is entirely negative-no further microscopic hematuria   PRIOR HISTORY SUMMARY----  Patient has a long history of recurrent stone disease complicated by development of right ureteral stricture and nonfunctioning right kidney status post robotic assisted laparoscopic nephrectomy in 2020.     Patient also has a history of prostate cancer initially diagnosed 03/2019--Gleason 3+4 =7 with pretreatment PSA of 5.53. Patient underwent LDR implant 07/2019. Nadir PSA was 2.46 January 2022.  His PSA continued to rise and a PSMA PET scan 10/2020 showed prostatic uptake only.  Biopsy however was negative at that time.  Follow-up PSA rose again to 4.912 2023.  Repeat PET scan at that time showed persistent tumor in the prostate and on biopsy Gleason 4+4 disease was present in all 12 cores. PSA at that time 7.1   05/2022.   Patient started on ADT with Eligard 07/15/2022--plan is for minimum of 18 months/preferably 24 months   Completed salvage RT 11/08/2022   Portions of the above documentation were copied from a prior visit for review purposes only.  Allergies: Allergies  Allergen Reactions   Cefaclor Hives   Cephalosporins Hives   Penicillins Rash    Mild maculopapular rash Has patient had a PCN reaction causing immediate rash, facial/tongue/throat swelling, SOB or lightheadedness with hypotension: No Has patient had a PCN reaction causing severe rash involving mucus membranes or skin necrosis: No Has patient had a PCN reaction that required hospitalization: No Has patient had a PCN reaction occurring within the last 10 years: No If all of the above answers are "NO", then may proceed with Cephalosporin use.     PMH: Past Medical History:  Diagnosis Date   Adenoma of right adrenal gland 07/11/2002   2.4cm , noted on CT ABD   Anxiety    Arthritis of both knees 03/26/2016  Astigmatism    Back pain    BCC (basal cell carcinoma of skin)    under right eye and right ear   Blood transfusion without reported diagnosis    Cataract 09/29/2016   Depression    Diverticulitis 2009   Diverticulosis    Family history of breast cancer    Family history of colon cancer    Fatty liver    GERD (gastroesophageal reflux disease)    Gout    Hearing loss of both ears 03/26/2016   History  of atrial fibrillation    x 2 none since 2020   History of chronic prostatitis    started at age 40   History of kidney stones    Hx of adenomatous colonic polyps 02/16/2022   3 diminutive - no recall   Hyperglycemia    Hyperlipidemia    Hypertension    Incomplete right bundle branch block (RBBB)    Internal hemorrhoids    Kidney lesion 06/07/2015   Right midportion, 1.1x1.1 cm hyperechoic, noted on [place] ABD   LAFB (left anterior fascicular block)    Lipoma of axilla 09/2016   Liver lesion, right lobe 06/07/2015   2.5x2.4x2.4 cm hypoechoic lesion posterior aspect, noted on [place] ABD   Low back pain 03/26/2016   LVH (left ventricular hypertrophy) 08/06/2017   Moderate, Noted on ECHO   Medicare annual wellness visit, subsequent 03/12/2014   OA (osteoarthritis)    Back, Hands, Neck   Obesity 11/25/2007   Qualifier: Diagnosis of  By: Kriste Basque MD, Lonzo Cloud    Other malaise and fatigue 03/13/2013   Premature ventricular complex    Prostate cancer (HCC) dx 2021   Renal insufficiency    Kidney removed right   Scoliosis    Upper thoracic and lumbar   Sinus bradycardia 08/06/2017   Noted on EKG   Vitamin D deficiency    resolved    PSH: Past Surgical History:  Procedure Laterality Date   CARDIOVERSION  07/31/2017   CATARACT EXTRACTION, BILATERAL     COLON SURGERY  2009   segmental sigmoid resection   COLONOSCOPY     COLONOSCOPY WITH PROPOFOL     CYSTOSCOPY N/A 07/28/2019   Procedure: CYSTOSCOPY FLEXIBLE;  Surgeon: Malen Gauze, MD;  Location: [facility];  Service: Urology;  Laterality: N/A;   CYSTOSCOPY W/ RETROGRADES Right 06/07/2018   Procedure: CYSTOSCOPY WITH RETROGRADE PYELOGRAM, uphrostogram;  Surgeon: Malen Gauze, MD;  Location: [facility];  Service: Urology;  Laterality: Right;   CYSTOSCOPY WITH URETEROSCOPY AND STENT PLACEMENT Right 02/28/2018   Procedure: CYSTOSCOPY WITH URETEROSCOPY Iverson Alamin;  Surgeon: Ihor Gully, MD;  Location[facility];  Service: Urology;  Laterality: Right;   CYSTOSCOPY/URETEROSCOPY/HOLMIUM LASER/STENT PLACEMENT Right 11/29/2017   Procedure: CYSTOSCOPY/RETROGRADE/URETEROSCOPY/HOLMIUM LASER/STENT PLACEMENT;  Surgeon: Ihor Gully, MD;  Location: [facility];  Service: Urology;  Laterality: Right;   EYE SURGERY Bilateral 01/12/2017   cataract removal   history of blood tranfusion  age 70   HOLMIUM LASER APPLICATION Right 09/24/2017   Procedure: HOLMIUM LASER APPLICATION;  Surgeon: Ihor Gully, MD;  Location: [facility];  Service: Urology;  Laterality: Right;   IR BALLOON DILATION URETERAL STRICTURE RIGHT  03/14/2018   IR NEPHRO TUBE REMOV/FL  03/17/2018   IR NEPHROSTOGRAM RIGHT THRU EXISTING ACCESS  03/17/2018   IR NEPHROSTOMY EXCHANGE RIGHT  03/14/2018   IR NEPHROSTOMY EXCHANGE RIGHT  06/21/2018   IR NEPHROSTOMY PLACEMENT RIGHT  03/02/2018   IR NEPHROSTOMY PLACEMENT RIGHT  04/20/2018   IR URETERAL STENT PLACEMENT EXISTING ACCESS RIGHT  03/14/2018   NOSE SURGERY     Submucous resection age 74   NUCLEAR STRESS TEST  06/03/2009   RADIOACTIVE SEED IMPLANT N/A 07/28/2019   Procedure: RADIOACTIVE SEED IMPLANT/BRACHYTHERAPY IMPLANT;  Surgeon: Malen Gauze, MD;  Location: [facility];  Service: Urology;  Laterality: N/A;  90 MINS   ROBOT ASSISTED LAPAROSCOPIC NEPHRECTOMY Right 08/04/2018   Procedure: XI ROBOTIC ASSISTED LAPAROSCOPIC NEPHRECTOMY;  Surgeon: Malen Gauze, MD;  Location: [facility];  Service: Urology;  Laterality: Right;  3 HRS   ROBOT ASSISTED PYELOPLASTY Right 06/07/2018   Procedure: attempted XI ROBOTIC ASSISTED PYELOPLASTY, lysis of adhesions;  Surgeon: Malen Gauze, MD;  Location: [facility];  Service: Urology;  Laterality: Right;  3 HRS   SKIN BIOPSY     SPACE OAR INSTILLATION N/A 07/28/2019   Procedure: SPACE OAR INSTILLATION;  Surgeon: Malen Gauze, MD;  Location: [facility];  Service: Urology;  Laterality:  N/A;   URETEROSCOPY WITH HOLMIUM LASER LITHOTRIPSY Right 09/24/2017   Procedure: CYSTOSCOPY, URETEROSCOPY WITH HOLMIUM LASER LITHOTRIPSY, STENT PLACEMENT;  Surgeon: Ihor Gully, MD;  Location: [facility];  Service: Urology;  Laterality: Right;    SH: Social History   Tobacco Use   Smoking status: Former    Current packs/day: 0.00    Average packs/day: 1 pack/day for 6.0 years (6.0 ttl pk-yrs)    Types: Cigarettes    Start date: 01/11/1969    Quit date: 01/12/1969    Years since quitting: 54.0   Smokeless tobacco: Never   Tobacco comments:    1965-1971  Vaping Use   Vaping status: Never Used  Substance Use Topics   Alcohol use: Not Currently    Comment: 2 whiskey drinks per day   Drug use: Never    ROS: Constitutional:  Negative for fever, chills, weight loss CV: Negative for chest pain, previous MI, hypertension Respiratory:  Negative for shortness of breath, wheezing, sleep apnea, frequent cough GI:  Negative for nausea, vomiting, bloody stool, GERD  PE: There were no vitals taken for this visit. GENERAL APPEARANCE:  Well appearing, well developed, well nourished, NAD  GU: Uncircumcised phallus with mild balanitis on retraction of foreskin.   Results: UA entirely clear

## 2023-02-03 NOTE — Progress Notes (Signed)
Eligard SubQ Injection   Due to Prostate Cancer patient is present today for a Eligard Injection.  Medication: Eligard 6 month Dose: 45 mg  Location: left  Lot: [id number] Exp: 06/2024  Patient tolerated well, no complications were noted  Performed by: Jakari Jacot N., CMA(AAMA)

## 2023-02-04 LAB — PSA: Prostate Specific Ag, Serum: <0.1 ng/mL (ref 0.0–4.0)

## 2023-02-08 ENCOUNTER — Encounter: Payer: Self-pay | Admitting: Neurology

## 2023-02-08 LAB — URINALYSIS, ROUTINE W REFLEX MICROSCOPIC
Bilirubin, UA: NEGATIVE
Glucose, UA: NEGATIVE
Ketones, UA: NEGATIVE
Leukocytes,UA: NEGATIVE
Nitrite, UA: NEGATIVE
Protein,UA: NEGATIVE
RBC, UA: NEGATIVE
Specific Gravity, UA: 1.025 (ref 1.005–1.030)
Urobilinogen, Ur: 0.2 mg/dL (ref 0.2–1.0)
pH, UA: 6 (ref 5.0–7.5)

## 2023-02-10 ENCOUNTER — Ambulatory Visit: Payer: Self-pay | Admitting: Licensed Clinical Social Worker

## 2023-02-10 NOTE — Patient Outreach (Signed)
  Care Coordination   Follow Up Visit Note   02/10/2023 Name: David [name] MRN: [id number] DOB: 1945/04/09  David [name] is a 78 y.o. year old male who sees [name], MD for primary care. I spoke with  [name] by phone today.  What matters to the patients health and wellness today? Patient has challenges in managing anxiety or depression issues    Goals Addressed             This Visit's Progress    Patient has challenges in managing anxiety or depression issues       Interventions: LCSW spoke via phone today with client about client needs and status Discussed client support . He said he has support from his spouse and from his children Discussed needs of client's wife Discussed transport needs of client. He said he has no transport needs. He drives to and from his medical appointments Discussed client support with PCP, Dr. Danise Edge. He said he has seen PCP for about 20 years Discussed client history of receiving cancer treatments at [facility]. He said he had received 40 cancer treatments at [facility]. He said he is appreciative of staff at [facility] and for their support while he was receiving his cancer treatments Discussed sleep challenges. Client said he has reduced sleep Reviewed pain issues. Client spoke of arthritis pain  Reviewed medication procurement for client Discussed program support with RN, LCSW, Pharmacist Client spoke of challenges in urination. He said he sometimes has burning in urination. He said he  now sees a new urologist Provided counseling support Client spoke of left knee pain. He has a cane to use as needed to help him walk Discussed relaxation techniques. Client likes to read to help him relax Used Active Listening Techniques to allow client to share feelings Encouraged client to call LCSW as needed for SW support at [phone] [facility] client for phone call with LCSW today           SDOH assessments and interventions completed:  Yes  SDOH Interventions Today    Flowsheet Row Most Recent Value  SDOH Interventions   Depression Interventions/Treatment  Counseling  Physical Activity Interventions Other (Comments)  [some mobility challenges. History of cancer treatments]  Stress Interventions Provide Counseling  [has stress in managing medical needs]        Care Coordination Interventions:  Yes, provided   Interventions Today    Flowsheet Row Most Recent Value  Chronic Disease   Chronic disease during today's visit Other  [spoke with client about client needs]  General Interventions   General Interventions Discussed/Reviewed General Interventions Discussed, Community Resources  Education Interventions   Education Provided Provided Education  Provided Verbal Education On [employer]  Mental Health Interventions   Mental Health Discussed/Reviewed Coping Strategies  Nutrition Interventions   Nutrition Discussed/Reviewed Nutrition Discussed  Pharmacy Interventions   Pharmacy Dicussed/Reviewed Pharmacy Topics Discussed  Safety Interventions   Safety Discussed/Reviewed Fall Risk        Follow up plan: Follow up call scheduled for 04/05/23 at 9:00 AM    Encounter Outcome:  Patient Visit Completed    Lorna Few  MSW, LCSW [place] Licensed Clinical Social Worker Direct Dial:  [phone] Fax:  [phone] Website:  [email or web address]

## 2023-02-10 NOTE — Patient Instructions (Signed)
Visit Information  Thank you for taking time to visit with me today. Please don't hesitate to contact me if I can be of assistance to you.   Following are the goals we discussed today:   Goals Addressed             This Visit's Progress    Patient has challenges in managing anxiety or depression issues       Interventions: LCSW spoke via phone today with client about client needs and status Discussed client support . He said he has support from his spouse and from his children Discussed needs of client's wife Discussed transport needs of client. He said he has no transport needs. He drives to and from his medical appointments Discussed client support with PCP, Dr. Danise Edge. He said he has seen PCP for about 20 years Discussed client history of receiving cancer treatments at [facility] He said he had received 40 cancer treatments at [facility]. He said he is appreciative of staff at [facility] and for their support while he was receiving his cancer treatments Discussed sleep challenges. Client said he has reduced sleep Reviewed pain issues. Client spoke of arthritis pain  Reviewed medication procurement for client Discussed program support with RN, LCSW, Pharmacist Client spoke of challenges in urination. He said he sometimes has burning in urination. He said he  now sees a new urologist Provided counseling support Client spoke of left knee pain. He has a cane to use as needed to help him walk Discussed relaxation techniques. Client likes to read to help him relax Used Active Listening Techniques to allow client to share feelings Encouraged client to call LCSW as needed for SW support at [phone] [facility] client for phone call with LCSW today          Our next appointment is by telephone on 04/05/23 at 9:00 AM   Please call the care guide team at [phone] if you need to cancel or reschedule your appointment.   If you  are experiencing a Mental Health or Behavioral Health Crisis or need someone to talk to, please go to [facility] [address], [place] [phone])   The patient verbalized understanding of instructions, educational materials, and care plan provided today and DECLINED offer to receive copy of patient instructions, educational materials, and care plan.   The patient has been provided with contact information for the care management team and has been advised to call with any health related questions or concerns.    Lorna Few  MSW, LCSW [place] Licensed Clinical Social Worker Direct Dial:  [phone] Fax:  [phone] Website:  [email or web address]

## 2023-02-20 ENCOUNTER — Other Ambulatory Visit: Payer: Self-pay | Admitting: Family Medicine

## 2023-02-22 ENCOUNTER — Other Ambulatory Visit: Payer: Self-pay | Admitting: Family Medicine

## 2023-02-22 ENCOUNTER — Telehealth: Payer: Self-pay

## 2023-02-22 NOTE — Telephone Encounter (Signed)
 Requesting: Lorazepam  (Ativan ) 1 mg Tablet Contract: 12/08/2022 UDS: 12/08/2022 Last Visit: 12/08/2022 Next Visit: 03/25/2023 Last Refill: 11/09/2022  Please Advise

## 2023-02-22 NOTE — Telephone Encounter (Signed)
 Chief Complaint Prescription Refill or Medication Request (non symptomatic) Reason for Call Medication Question / Request Initial Comment Caller states he needs his prescription filled. Translation No Disp. Time [facility] Time) Disposition Final User 02/20/2023 5:19:58 PM Send To Nurse Auther Legacy, RN, Bynum Cassis 02/20/2023 5:21:23 PM Attempt made - message left Neysa Bares 02/20/2023 5:23:13 PM FINAL ATTEMPT MADE - message left Yes Neysa Bares 02/20/2023 5:23:21 PM Send to RN Final Attempt Ananias Karma, RN, Carolynne Citron Final Disposition 02/20/2023 5:23:13 PM FINAL ATTEMPT MADE - message left Yes Dustin Gimenez, RN, Carolynne Citron

## 2023-02-22 NOTE — Telephone Encounter (Signed)
 Rx sent.

## 2023-02-22 NOTE — Telephone Encounter (Signed)
 Requesting: Ativan  1 MG  Contract: 12/08/2022 UDS: 12/08/2022 Last Visit: 12/08/2022 Next Visit: 03/25/2023 Last Refill: 11/09/2022   Please Advise

## 2023-03-02 ENCOUNTER — Ambulatory Visit (INDEPENDENT_AMBULATORY_CARE_PROVIDER_SITE_OTHER): Payer: Medicare Other

## 2023-03-02 VITALS — Ht 71.0 in | Wt 225.0 lb

## 2023-03-02 DIAGNOSIS — Z0000 Encounter for general adult medical examination without abnormal findings: Secondary | ICD-10-CM

## 2023-03-02 NOTE — Progress Notes (Signed)
Subjective:   David [name] is a 78 y.o. male who presents for Medicare Annual/Subsequent preventive examination.  Visit Complete: Virtual I connected with  David [name] on 03/02/23 by a audio enabled telemedicine application and verified that I am speaking with the correct person using two identifiers.  Patient Location: Home  Provider Location: Home Office  I discussed the limitations of evaluation and management by telemedicine. The patient expressed understanding and agreed to proceed.  Vital Signs: Because this visit was a virtual/telehealth visit, some criteria may be missing or patient reported. Any vitals not documented were not able to be obtained and vitals that have been documented are patient reported.  Patient Medicare AWV questionnaire was completed by the patient on 03/02/23; I have confirmed that all information answered by patient is correct and no changes since this date.  Cardiac Risk Factors include: advanced age (>32men, >34 women);male gender;hypertension     Objective:    Today's Vitals   03/02/23 1602  Weight: 225 lb (102.1 kg)  Height: 5\' 11"  (1.803 m)   Body mass index is 31.38 kg/m.     03/02/2023    4:16 PM 12/02/2022   11:35 AM 11/29/2022   11:03 AM 11/15/2022    1:06 PM 11/08/2022    1:49 AM 10/17/2022   10:38 AM 08/13/2022    4:21 PM  Advanced Directives  Does Patient Have a Medical Advance Directive? No No No No No No No  Would patient like information on creating a medical advance directive? No - Patient declined No - Patient declined No - Patient declined No - Patient declined  No - Patient declined     Current Medications (verified) Outpatient Encounter Medications as of 03/02/2023  Medication Sig   acetaminophen (TYLENOL) 325 MG tablet Take 2 tablets (650 mg total) by mouth every 6 (six) hours as needed.   allopurinol (ZYLOPRIM) 100 MG tablet TAKE 1/2 TABLET BY MOUTH DAILY   ascorbic acid (VITAMIN C) 1000 MG tablet Take by mouth.    atenolol (TENORMIN) 50 MG tablet Take 1 tablet (50 mg total) by mouth 2 (two) times daily.   atorvastatin (LIPITOR) 10 MG tablet Take 1 tablet (10 mg total) by mouth every morning.   Cholecalciferol (VITAMIN D-1000 MAX ST) 25 MCG (1000 UT) tablet Take by mouth.   clotrimazole-betamethasone (LOTRISONE) cream Apply 1 Application topically 2 (two) times daily.   colchicine 0.6 MG tablet For gout flares, take 1 tab po once then repeat in 1 hour as needed if pain persists up to a total of 3 doses   ELIQUIS 5 MG TABS tablet TAKE 1 TABLET BY MOUTH 2 TIMES A DAY   FLUoxetine (PROZAC) 20 MG tablet Take 1 tablet (20 mg total) by mouth 2 (two) times daily.   fluticasone (FLONASE) 50 MCG/ACT nasal spray Place 2 sprays into both nostrils daily. (Patient taking differently: Place 2 sprays into both nostrils as needed.)   ketoconazole (NIZORAL) 2 % cream Apply 1 Application topically daily.   LORazepam (ATIVAN) 1 MG tablet TAKE ONE TABLET BY MOUTH EVERY 8 HOURS AS NEEDED FOR ANXIETY   omeprazole (PRILOSEC) 20 MG capsule Take 20 mg by mouth daily as needed (acid reflux).    tamsulosin (FLOMAX) 0.4 MG CAPS capsule Take 1 capsule (0.4 mg total) by mouth daily after supper.   tiZANidine (ZANAFLEX) 2 MG tablet Take 0.5-2 tablets (1-4 mg total) by mouth every 8 (eight) hours as needed for muscle spasms (joint pain).   triamcinolone (KENALOG)  0.025 % cream    No facility-administered encounter medications on file as of 03/02/2023.    Allergies (verified) Cefaclor, Cephalosporins, and Penicillins   History: Past Medical History:  Diagnosis Date   Adenoma of right adrenal gland 07/11/2002   2.4cm , noted on CT ABD   Anxiety    Arthritis of both knees 03/26/2016   Astigmatism    Back pain    BCC (basal cell carcinoma of skin)    under right eye and right ear   Blood transfusion without reported diagnosis    Cataract 09/29/2016   Depression    Diverticulitis 2009   Diverticulosis    Family history of  breast cancer    Family history of colon cancer    Fatty liver    GERD (gastroesophageal reflux disease)    Gout    Hearing loss of both ears 03/26/2016   History of atrial fibrillation    x 2 none since 2020   History of chronic prostatitis    started at age 36   History of kidney stones    Hx of adenomatous colonic polyps 02/16/2022   3 diminutive - no recall   Hyperglycemia    Hyperlipidemia    Hypertension    Incomplete right bundle branch block (RBBB)    Internal hemorrhoids    Kidney lesion 06/07/2015   Right midportion, 1.1x1.1 cm hyperechoic, noted on [place] ABD   LAFB (left anterior fascicular block)    Lipoma of axilla 09/2016   Liver lesion, right lobe 06/07/2015   2.5x2.4x2.4 cm hypoechoic lesion posterior aspect, noted on [place] ABD   Low back pain 03/26/2016   LVH (left ventricular hypertrophy) 08/06/2017   Moderate, Noted on ECHO   Medicare annual wellness visit, subsequent 03/12/2014   OA (osteoarthritis)    Back, Hands, Neck   Obesity 11/25/2007   Qualifier: Diagnosis of  By: Kriste Basque MD, Lonzo Cloud    Other malaise and fatigue 03/13/2013   Premature ventricular complex    Prostate cancer (HCC) dx 2021   Renal insufficiency    Kidney removed right   Scoliosis    Upper thoracic and lumbar   Sinus bradycardia 08/06/2017   Noted on EKG   Vitamin D deficiency    resolved   Past Surgical History:  Procedure Laterality Date   CARDIOVERSION  07/31/2017   CATARACT EXTRACTION, BILATERAL     COLON SURGERY  2009   segmental sigmoid resection   COLONOSCOPY     COLONOSCOPY WITH PROPOFOL     CYSTOSCOPY N/A 07/28/2019   Procedure: CYSTOSCOPY FLEXIBLE;  Surgeon: Malen Gauze, MD;  [facility];  Service: Urology;  Laterality: N/A;   CYSTOSCOPY W/ RETROGRADES Right 06/07/2018   Procedure: CYSTOSCOPY WITH RETROGRADE PYELOGRAM, uphrostogram;  Surgeon: Malen Gauze, MD;  Location: [facility];  Service: Urology;  Laterality: Right;   CYSTOSCOPY  WITH URETEROSCOPY AND STENT PLACEMENT Right 02/28/2018   Procedure: CYSTOSCOPY WITH URETEROSCOPY Iverson Alamin;  Surgeon: Ihor Gully, MD;  Location: [facility];  Service: Urology;  Laterality: Right;   CYSTOSCOPY/URETEROSCOPY/HOLMIUM LASER/STENT PLACEMENT Right 11/29/2017   Procedure: CYSTOSCOPY/RETROGRADE/URETEROSCOPY/HOLMIUM LASER/STENT PLACEMENT;  Surgeon: Ihor Gully, MD;  Location: [facility];  Service: Urology;  Laterality: Right;   EYE SURGERY Bilateral 01/12/2017   cataract removal   history of blood tranfusion  age 81   HOLMIUM LASER APPLICATION Right 09/24/2017   Procedure: HOLMIUM LASER APPLICATION;  Surgeon: Ihor Gully, MD;  Location[facility];  Service: Urology;  Laterality: Right;   IR BALLOON DILATION URETERAL STRICTURE RIGHT  03/14/2018   IR NEPHRO TUBE REMOV/FL  03/17/2018   IR NEPHROSTOGRAM RIGHT THRU EXISTING ACCESS  03/17/2018   IR NEPHROSTOMY EXCHANGE RIGHT  03/14/2018   IR NEPHROSTOMY EXCHANGE RIGHT  06/21/2018   IR NEPHROSTOMY PLACEMENT RIGHT  03/02/2018   IR NEPHROSTOMY PLACEMENT RIGHT  04/20/2018   IR URETERAL STENT PLACEMENT EXISTING ACCESS RIGHT  03/14/2018   NOSE SURGERY     Submucous resection age 45   NUCLEAR STRESS TEST  06/03/2009   RADIOACTIVE SEED IMPLANT N/A 07/28/2019   Procedure: RADIOACTIVE SEED IMPLANT/BRACHYTHERAPY IMPLANT;  Surgeon: Malen Gauze, MD;  Location: [facility];  Service: Urology;  Laterality: N/A;  90 MINS   ROBOT ASSISTED LAPAROSCOPIC NEPHRECTOMY Right 08/04/2018   Procedure: XI ROBOTIC ASSISTED LAPAROSCOPIC NEPHRECTOMY;  Surgeon: Malen Gauze, MD;  Location: [facility];  Service: Urology;  Laterality: Right;  3 HRS   ROBOT ASSISTED PYELOPLASTY Right 06/07/2018   Procedure: attempted XI ROBOTIC ASSISTED PYELOPLASTY, lysis of adhesions;  Surgeon: Malen Gauze, MD;  Location: [facility];  Service: Urology;  Laterality: Right;  3 HRS   SKIN BIOPSY     SPACE OAR INSTILLATION  N/A 07/28/2019   Procedure: SPACE OAR INSTILLATION;  Surgeon: Malen Gauze, MD;  Location: [facility];  Service: Urology;  Laterality: N/A;   URETEROSCOPY WITH HOLMIUM LASER LITHOTRIPSY Right 09/24/2017   Procedure: CYSTOSCOPY, URETEROSCOPY WITH HOLMIUM LASER LITHOTRIPSY, STENT PLACEMENT;  Surgeon: Ihor Gully, MD;  Location: [facility];  Service: Urology;  Laterality: Right;   Family History  Problem Relation Age of Onset   Heart disease Mother    Hypertension Mother    Stroke Mother    Colon cancer Mother 7   Breast cancer Mother 91   Heart disease Father        pacemaker   Aortic aneurysm Father    Hypertension Father    Lung cancer Father 49   Heart disease Sister    Atrial fibrillation Sister    Obesity Sister    Sleep apnea Sister    Heart attack Brother    Other Brother        muscle disease   Arthritis Brother    Stroke Brother    Atrial fibrillation Brother    Heart attack Brother    Atrial fibrillation Brother    Diabetes Brother    Atrial fibrillation Brother    Heart attack Brother    Hypertension Brother    Hyperlipidemia Brother    Heart attack Brother    Other Brother        heart valve operation   Atrial fibrillation Brother    Heart attack Maternal Grandmother    Diabetes Maternal Grandmother    Cancer Maternal Grandfather 44       hodgin's lymphoma   Heart attack Paternal Grandmother    Anxiety disorder Paternal Grandmother    Pneumonia Paternal Grandfather    Liver cancer Nephew 2       great nephew; d. 7   Lymphoma Niece 20   Esophageal cancer Neg Hx    Rectal cancer Neg Hx    Stomach cancer Neg Hx    Colon polyps Neg Hx    Pancreatic cancer Neg Hx    Social History   Socioeconomic History   Marital status: Married    Spouse name: Not on file   Number of children: 3   Years of education:  Not on file   Highest education level: Bachelor's degree (e.g., BA, AB, BS)  Occupational History    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[employer]    Employer: [employer]  Tobacco Use   Smoking status: Former    Current packs/day: 0.00    Average packs/day: 1 pack/day for 6.0 years (6.0 ttl pk-yrs)    Types: Cigarettes    Start date: 01/11/1969    Quit date: 01/12/1969    Years since quitting: 54.1   Smokeless tobacco: Never   Tobacco comments:    1965-1971  Vaping Use   Vaping status: Never Used  Substance and Sexual Activity   Alcohol use: Not Currently    Comment: 2 whiskey drinks per day   Drug use: Never   Sexual activity: Yes  Other Topics Concern   Not on file  Social History Narrative   The patient is married for the second time, he has 3 sons.   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   2 alcoholic beverages most days.   1 caffeinated beverage daily   No drug use no current tobacco use he is a prior smoker   12/24/2016   Social Drivers of Health   Financial Resource Strain: Medium Risk (03/02/2023)   Overall Financial Resource Strain (CARDIA)    Difficulty of Paying Living Expenses: Somewhat hard  Food Insecurity: Food Insecurity Present (03/02/2023)   Hunger Vital Sign    Worried About Running Out of Food in the Last Year: Sometimes true    Ran Out of Food in the Last Year: Never true  Transportation Needs: No Transportation Needs (03/02/2023)   PRAPARE - Administrator, Civil Service (Medical): No    Lack of Transportation (Non-Medical): No  Physical Activity: Inactive (03/02/2023)   Exercise Vital Sign    Days of Exercise per Week: 0 days    Minutes of Exercise per Session: 0 min  Stress: Stress Concern Present (03/02/2023)   [employer] of Occupational Health - Occupational Stress Questionnaire    Feeling of Stress : Very much  Social Connections: Moderately Isolated (03/02/2023)   Social Connection and Isolation Panel [NHANES]    Frequency of Communication with Friends and Family: More than three times a week    Frequency of Social Gatherings with Friends and Family: Twice  a week    Attends Religious Services: Never    Database administrator or Organizations: No    Attends Engineer, structural: Not on file    Marital Status: Married    Tobacco Counseling Counseling given: Not Answered Tobacco comments: [phone]   Clinical Intake:  Pre-visit preparation completed: Yes  Pain : No/denies pain     BMI - recorded: 31.38 Nutritional Status: BMI > 30  Obese Nutritional Risks: None Diabetes: No  How often do you need to have someone help you when you read instructions, pamphlets, or other written materials from your doctor or pharmacy?: 1 - Never  Interpreter Needed?: No  Information entered by :: Theresa Mulligan LPN   Activities of Daily Living    03/02/2023    4:09 PM 03/02/2023    2:58 PM  In your present state of health, do you have any difficulty performing the following activities:  Hearing? 1 1  Comment Without use of Hearing Aids   Vision? 0 1  Difficulty concentrating or making decisions? 0 1  Walking or climbing stairs? 1 1  Comment Uses a Cane   Dressing or bathing? 0 0  Doing errands, shopping?  0 0  Preparing Food and eating ? N Y  Using the Toilet? N Y  In the past six months, have you accidently leaked urine? Malvin Johns  Comment Wears Pads. Followed by PCP   Do you have problems with loss of bowel control? N Y  Managing your Medications? N N  Managing your Finances? N Y  Housekeeping or managing your Housekeeping? N Y    Patient Care Team: [name], MD as PCP - General (Family Medicine) [name], MD as PCP - Cardiology (Cardiology) Francee Piccolo, MD as Consulting Physician (Ophthalmology) Arminda Resides, MD as Consulting Physician (Dermatology) Felicita Gage, RN Nurse Navigator as Registered Nurse (Medical Oncology) [facility]  Indicate any recent Medical Services you may have received from other than [facility] providers in the past year (date may be approximate).     Assessment:    This is a routine wellness examination for Akiem.  Hearing/Vision screen Hearing Screening - Comments::  Hearing difficulties  Not wearing Hearing Aids   Goals Addressed               This Visit's Progress     Increase physical activity (pt-stated)         Depression Screen    03/02/2023    4:26 PM 02/10/2023   10:43 AM 12/21/2022   12:55 PM 11/23/2022    4:28 PM 08/03/2022   12:59 PM 07/09/2022    3:27 PM 03/10/2022    9:27 AM  PHQ 2/9 Scores  PHQ - 2 Score 0 2 2 2 4 4 1   PHQ- 9 Score 0 7 7 7  5 1     Fall Risk    03/02/2023    4:13 PM 03/02/2023    2:58 PM 10/07/2022    1:22 PM 07/09/2022    3:26 PM 03/10/2022    9:27 AM  Fall Risk   Falls in the past year? 1 1 1  0 0  Number falls in past yr: 0 0 0 0 0  Injury with Fall? 0 1 1 0 0  Risk for fall due to : No Fall Risks      Follow up Falls prevention discussed;Falls evaluation completed  Falls evaluation completed;Education provided Falls evaluation completed Falls evaluation completed    MEDICARE RISK AT HOME: Medicare Risk at Home Any stairs in or around the home?: Yes If so, are there any without handrails?: No Home free of loose throw rugs in walkways, pet beds, electrical cords, etc?: Yes Adequate lighting in your home to reduce risk of falls?: Yes Life alert?: No Use of a cane, walker or w/c?: Yes Grab bars in the bathroom?: No Shower chair or bench in shower?: Yes Elevated toilet seat or a handicapped toilet?: No  TIMED UP AND GO:  Was the test performed?  No    Cognitive Function:    08/25/2022    3:27 PM  MMSE - Mini Mental State Exam  Orientation to time 4  Orientation to Place 5  Registration 3  Attention/ Calculation 5  Recall 2  Language- name 2 objects 2  Language- repeat 1  Language- follow 3 step command 3  Language- read & follow direction 1  Write a sentence 1  Copy design 1  Total score 28      11/30/2022    3:47 PM  Montreal Cognitive Assessment   Visuospatial/ Executive  (0/5) 5  Naming (0/3) 2  Attention: Read list of digits (0/2) 2  Attention:  Read list of letters (0/1) 1  Attention: Serial 7 subtraction starting at 100 (0/3) 3  Language: Repeat phrase (0/2) 2  Language : Fluency (0/1) 1  Abstraction (0/2) 2  Delayed Recall (0/5) 0  Orientation (0/6) 4  Total 22      03/02/2023    4:16 PM 02/26/2022    1:24 PM 02/15/2020    9:19 AM  6CIT Screen  What Year? 0 points 0 points 0 points  What month? 0 points 0 points 0 points  What time? 0 points 0 points 0 points  Count back from 20 0 points 0 points 0 points  Months in reverse 0 points 0 points 0 points  Repeat phrase 6 points 2 points 0 points  Total Score 6 points 2 points 0 points    Immunizations Immunization History  Administered Date(s) Administered   Fluad Quad(high Dose 65+) 10/12/2019   Fluad Trivalent(High Dose 65+) 09/28/2022   Influenza Split 10/02/2015   Influenza, High Dose Seasonal PF 09/29/2016, 11/15/2017, 09/07/2018   Influenza,inj,Quad PF,6+ Mos 09/14/2012, 09/25/2013, 03/07/2015   Influenza-Unspecified 11/12/2020   PFIZER(Purple Top)SARS-COV-2 Vaccination 03/10/2019, 03/28/2019, 01/02/2020, 05/01/2020   Pneumococcal Conjugate-13 09/05/2012   Pneumococcal Polysaccharide-23 08/30/2014   Tdap 09/05/2012    TDAP status: Due, Education has been provided regarding the importance of this vaccine. Advised may receive this vaccine at local pharmacy or Health Dept. Aware to provide a copy of the vaccination record if obtained from local pharmacy or Health Dept. Verbalized acceptance and understanding.  Flu Vaccine status: Up to date  Pneumococcal vaccine status: Up to date  Covid-19 vaccine status: Declined, Education has been provided regarding the importance of this vaccine but patient still declined. Advised may receive this vaccine at local pharmacy or Health Dept.or vaccine clinic. Aware to provide a copy of the vaccination record if obtained from local pharmacy or Health  Dept. Verbalized acceptance and understanding.  Qualifies for Shingles Vaccine? Yes   Zostavax completed No   Shingrix Completed?: No.    Education has been provided regarding the importance of this vaccine. Patient has been advised to call insurance company to determine out of pocket expense if they have not yet received this vaccine. Advised may also receive vaccine at local pharmacy or Health Dept. Verbalized acceptance and understanding.  Screening Tests Health Maintenance  Topic Date Due   Zoster Vaccines- Shingrix (1 of 2) Never done   DTaP/Tdap/Td (2 - Td or Tdap) 09/06/2022   COVID-19 Vaccine (5 - 2024-25 season) 09/13/2022   Medicare Annual Wellness (AWV)  03/01/2024   Pneumonia Vaccine 63+ Years old  Completed   INFLUENZA VACCINE  Completed   Hepatitis C Screening  Completed   HPV VACCINES  Aged Out   Colonoscopy  Discontinued    Health Maintenance  Health Maintenance Due  Topic Date Due   Zoster Vaccines- Shingrix (1 of 2) Never done   DTaP/Tdap/Td (2 - Td or Tdap) 09/06/2022   COVID-19 Vaccine (5 - 2024-25 season) 09/13/2022        Additional Screening:  Hepatitis C Screening: does qualify; Completed 05/04/14  Vision Screening: Recommended annual ophthalmology exams for early detection of glaucoma and other disorders of the eye. Is the patient up to date with their annual eye exam?  Yes  Who is the provider or what is the name of the office in which the patient attends annual eye exams? Dr Rosette Reveal If pt is not established with a provider, would they like to be referred to a provider  to establish care? No .   Dental Screening: Recommended annual dental exams for proper oral hygiene    Community Resource Referral / Chronic Care Management:  CRR required this visit?  No   CCM required this visit?  No     Plan:     I have personally reviewed and noted the following in the patient's chart:   Medical and social history Use of alcohol, tobacco or illicit  drugs  Current medications and supplements including opioid prescriptions. Patient is not currently taking opioid prescriptions. Functional ability and status Nutritional status Physical activity Advanced directives List of other physicians Hospitalizations, surgeries, and ER visits in previous 12 months Vitals Screenings to include cognitive, depression, and falls Referrals and appointments  In addition, I have reviewed and discussed with patient certain preventive protocols, quality metrics, and best practice recommendations. A written personalized care plan for preventive services as well as general preventive health recommendations were provided to patient.     Tillie Rung, LPN   1/61/0960   After Visit Summary: (MyChart) Due to this being a telephonic visit, the after visit summary with patients personalized plan was offered to patient via MyChart   Nurse Notes: None

## 2023-03-02 NOTE — Patient Instructions (Addendum)
David [name] , Thank you for taking time to come for your Medicare Wellness Visit. I appreciate your ongoing commitment to your health goals. Please review the following plan we discussed and let me know if I can assist you in the future.   Referrals/Orders/Follow-Ups/Clinician Recommendations:   This is a list of the screening recommended for you and due dates:  Health Maintenance  Topic Date Due   Zoster (Shingles) Vaccine (1 of 2) Never done   DTaP/Tdap/Td vaccine (2 - Td or Tdap) 09/06/2022   COVID-19 Vaccine (5 - 2024-25 season) 09/13/2022   Medicare Annual Wellness Visit  03/01/2024   Pneumonia Vaccine  Completed   Flu Shot  Completed   Hepatitis C Screening  Completed   HPV Vaccine  Aged Out   Colon Cancer Screening  Discontinued    Advanced directives: (Declined) Advance directive discussed with you today. Even though you declined this today, please call our office should you change your mind, and we can give you the proper paperwork for you to fill out.  Next Medicare Annual Wellness Visit scheduled for next year: Yes

## 2023-03-15 ENCOUNTER — Encounter (HOSPITAL_BASED_OUTPATIENT_CLINIC_OR_DEPARTMENT_OTHER): Payer: Self-pay

## 2023-03-15 ENCOUNTER — Other Ambulatory Visit: Payer: Self-pay

## 2023-03-15 ENCOUNTER — Emergency Department (HOSPITAL_BASED_OUTPATIENT_CLINIC_OR_DEPARTMENT_OTHER): Admission: EM | Admit: 2023-03-15 | Discharge: 2023-03-15 | Disposition: A | Discharge status: Home / Self Care

## 2023-03-15 ENCOUNTER — Emergency Department (HOSPITAL_BASED_OUTPATIENT_CLINIC_OR_DEPARTMENT_OTHER)

## 2023-03-15 DIAGNOSIS — Z7901 Long term (current) use of anticoagulants: Secondary | ICD-10-CM | POA: Diagnosis not present

## 2023-03-15 DIAGNOSIS — Z79899 Other long term (current) drug therapy: Secondary | ICD-10-CM | POA: Diagnosis not present

## 2023-03-15 DIAGNOSIS — I10 Essential (primary) hypertension: Secondary | ICD-10-CM | POA: Diagnosis not present

## 2023-03-15 DIAGNOSIS — M79662 Pain in left lower leg: Secondary | ICD-10-CM | POA: Insufficient documentation

## 2023-03-15 DIAGNOSIS — R001 Bradycardia, unspecified: Secondary | ICD-10-CM | POA: Diagnosis not present

## 2023-03-15 DIAGNOSIS — M79605 Pain in left leg: Secondary | ICD-10-CM

## 2023-03-15 DIAGNOSIS — M7989 Other specified soft tissue disorders: Secondary | ICD-10-CM | POA: Diagnosis not present

## 2023-03-15 NOTE — Discharge Instructions (Signed)
 Your ultrasound was negative for blood clot.  As discussed you may try over-the-counter medications such as Tylenol.  Please follow-up with your primary doctor.  Return immediately to fevers, chills, sudden onset headache, chest pain, shortness of breath, stop urinating or develop any new or worsening symptoms

## 2023-03-15 NOTE — ED Provider Notes (Signed)
 [place] EMERGENCY DEPARTMENT AT [facility] Provider Note   CSN: [id number] Arrival date & time: 03/15/23  1041     History  Chief Complaint  Patient presents with   Hypertension   Leg Pain    David [name] is a 78 y.o. male.  This is a 77 year old male presenting emergency department for left leg pain.  Reports that he has had some intermittent left lower leg pain behind his left calf/knee.  No trauma.  Seemingly improved with massage.  Also notes elevated blood pressure.  No chest pain or shortness of breath.  No numbness tingling changes in sensation in extremity.   Hypertension  Leg Pain      Home Medications Prior to Admission medications   Medication Sig Start Date End Date Taking? Authorizing Provider  acetaminophen (TYLENOL) 325 MG tablet Take 2 tablets (650 mg total) by mouth every 6 (six) hours as needed. 08/13/22   Sloan Leiter, DO  allopurinol (ZYLOPRIM) 100 MG tablet TAKE 1/2 TABLET BY MOUTH DAILY 12/02/22   [name], MD  ascorbic acid (VITAMIN C) 1000 MG tablet Take by mouth.    [provider]  atenolol (TENORMIN) 50 MG tablet Take 1 tablet (50 mg total) by mouth 2 (two) times daily. 07/17/22   [name], MD  atorvastatin (LIPITOR) 10 MG tablet Take 1 tablet (10 mg total) by mouth every morning. 10/26/22   [name], MD  Cholecalciferol (VITAMIN D-1000 MAX ST) 25 MCG (1000 UT) tablet Take by mouth.    [provider]  clotrimazole-betamethasone (LOTRISONE) cream Apply 1 Application topically 2 (two) times daily. 12/17/22   Joline Maxcy, MD  colchicine 0.6 MG tablet For gout flares, take 1 tab po once then repeat in 1 hour as needed if pain persists up to a total of 3 doses 05/20/20   [name], MD  ELIQUIS 5 MG TABS tablet TAKE 1 TABLET BY MOUTH 2 TIMES A DAY 12/07/22   [name], MD  FLUoxetine (PROZAC) 20 MG tablet Take 1 tablet (20 mg total) by mouth 2 (two) times daily. 12/08/22   [name], MD  fluticasone (FLONASE) 50 MCG/ACT nasal spray Place 2 sprays into both nostrils daily. Patient taking differently: Place 2 sprays into both nostrils as needed. 06/29/22   Saguier, Ramon Dredge, PA-C  ketoconazole (NIZORAL) 2 % cream Apply 1 Application topically daily. 03/10/22   [name], MD  LORazepam (ATIVAN) 1 MG tablet TAKE ONE TABLET BY MOUTH EVERY 8 HOURS AS NEEDED FOR ANXIETY 02/22/23   Worthy Rancher B, FNP  omeprazole (PRILOSEC) 20 MG capsule Take 20 mg by mouth daily as needed (acid reflux).     [provider]  tamsulosin (FLOMAX) 0.4 MG CAPS capsule Take 1 capsule (0.4 mg total) by mouth daily after supper. 12/17/22   Joline Maxcy, MD  tiZANidine (ZANAFLEX) 2 MG tablet Take 0.5-2 tablets (1-4 mg total) by mouth every 8 (eight) hours as needed for muscle spasms (joint pain). 07/09/22   [name], MD  triamcinolone (KENALOG) 0.025 % cream  12/24/21   [provider]      Allergies    Cefaclor, Cephalosporins, and Penicillins    Review of Systems   Review of Systems  Physical Exam Updated Vital Signs BP (!) 143/83 (BP Location: Left Arm)   Pulse 61   Temp 98 F (36.7 C)   Resp 19   Wt 108.9 kg   SpO2  96%   BMI 33.47 kg/m  Physical Exam Vitals and nursing note reviewed.  Constitutional:      General: He is not in acute distress.    Appearance: He is not toxic-appearing.  Eyes:     Conjunctiva/sclera: Conjunctivae normal.  Cardiovascular:     Rate and Rhythm: Normal rate and regular rhythm.  Abdominal:     General: Abdomen is flat. There is no distension.     Palpations: Abdomen is soft.     Tenderness: There is no abdominal tenderness. There is no guarding or rebound.  Musculoskeletal:     Comments: 2+ DP pulses bilaterally.  Neurovascular intact in bilateral lower extremities.  Some minor swelling in the left lower extremity compared to right.  Skin:    General: Skin is warm and dry.  Neurological:     General: No focal deficit  present.     Mental Status: He is alert and oriented to person, place, and time.  Psychiatric:        Mood and Affect: Mood normal.        Behavior: Behavior normal.     ED Results / Procedures / Treatments   Labs (all labs ordered are listed, but only abnormal results are displayed) Labs Reviewed - No data to display  EKG None  Radiology US Venous Img Lower Unilateral Left Result Date: 03/15/2023 CLINICAL DATA:  Pain and swelling EXAM: Left LOWER EXTREMITY VENOUS DOPPLER ULTRASOUND TECHNIQUE: Gray-scale sonography with compression, as well as color and duplex ultrasound, were performed to evaluate the deep venous system(s) from the level of the common femoral vein through the popliteal and proximal calf veins. COMPARISON:  None Available. FINDINGS: VENOUS Normal compressibility of the common femoral, superficial femoral, and popliteal veins, as well as the visualized calf veins. Visualized portions of profunda femoral vein and great saphenous vein unremarkable. No filling defects to suggest DVT on grayscale or color Doppler imaging. Doppler waveforms show normal direction of venous flow, normal respiratory plasticity and response to augmentation. Limited views of the contralateral common femoral vein are unremarkable. OTHER None. Limitations: none IMPRESSION: No evidence of left lower extremity DVT. Electronically Signed   By: Karen Kays M.D.   On: 03/15/2023 14:25    Procedures Procedures    Medications Ordered in ED Medications - No data to display  ED Course/ Medical Decision Making/ A&P Clinical Course as of 03/15/23 1436  Mon Mar 15, 2023  1434 US Venous Img Lower Unilateral Left IMPRESSION: No evidence of left lower extremity DVT.   [TY]    Clinical Course User Index [TY] Coral Spikes, DO                                 Medical Decision Making This is a well-appearing 78 year old male presenting emergency department for left leg swelling and pain.  He is afebrile  nontachycardic slightly hypertensive.  On exam some minor leg swelling.  No history of blood clots.  He is on Eliquis per chart review.  Ultrasound negative for DVT today.  Suspect MSK etiology.  Discussed over-the-counter medications.  He is hypertensive, but asymptomatic.  Discussed follow-up with primary doctor.  Stable for discharge at this time.  Amount and/or Complexity of Data Reviewed Radiology:  Decision-making details documented in ED Course.         Final Clinical Impression(s) / ED Diagnoses Final diagnoses:  None    Rx / DC Orders  ED Discharge Orders     None         Coral Spikes, DO 03/15/23 1436

## 2023-03-15 NOTE — ED Triage Notes (Signed)
 Pt complains of left leg pain x 1 week and states that his blood pressure has been elevated.

## 2023-03-16 ENCOUNTER — Telehealth: Payer: Self-pay

## 2023-03-16 ENCOUNTER — Ambulatory Visit: Admitting: Family

## 2023-03-16 NOTE — Transitions of Care (Post Inpatient/ED Visit) (Signed)
 03/16/2023  Name: David [name] MRN: [id number] DOB: 1945-03-24  Today's TOC FU Call Status: Today's TOC FU Call Status:: Successful TOC FU Call Completed TOC FU Call Complete Date: 03/16/23 Patient's Name and Date of Birth confirmed.  Transition Care Management Follow-up Telephone Call Date of Discharge: 03/15/23 Discharge Facility: [facility] Type of Discharge: Emergency Department Reason for ED Visit: Other: (left leg pain) How have you been since you were released from the hospital?: Better Any questions or concerns?: No  Items Reviewed: Did you receive and understand the discharge instructions provided?: Yes Medications obtained,verified, and reconciled?: Yes (Medications Reviewed) Any new allergies since your discharge?: No Dietary orders reviewed?: Yes Do you have support at home?: Yes People in Home: child(ren), adult  Medications Reviewed Today: Medications Reviewed Today     Reviewed by Karena Addison, LPN (Licensed Practical Nurse) on 03/16/23 at 1501  Med List Status: <None>   Medication Order Taking? Sig Documenting Provider Last Dose Status Informant  acetaminophen (TYLENOL) 325 MG tablet [id number] No Take 2 tablets (650 mg total) by mouth every 6 (six) hours as needed. Sloan Leiter, DO Taking Active   allopurinol (ZYLOPRIM) 100 MG tablet [id number] No TAKE 1/2 TABLET BY MOUTH DAILY [name], MD Taking Active   ascorbic acid (VITAMIN C) 1000 MG tablet [id number] No Take by mouth. [provider] Taking Active            Med Note Lamona Curl   Wed Feb 11, 2022  2:42 PM)    atenolol (TENORMIN) 50 MG tablet [id number] No Take 1 tablet (50 mg total) by mouth 2 (two) times daily. [name], MD Taking Active   atorvastatin (LIPITOR) 10 MG tablet [id number] No Take 1 tablet (10 mg total) by mouth every morning. [name], MD Taking Active   Cholecalciferol (VITAMIN D-1000 MAX ST) 25 MCG (1000 UT) tablet [id number] No Take by  mouth. [provider] Taking Active   clotrimazole-betamethasone (LOTRISONE) cream [id number] No Apply 1 Application topically 2 (two) times daily. Joline Maxcy, MD Taking Active   colchicine 0.6 MG tablet [id number] No For gout flares, take 1 tab po once then repeat in 1 hour as needed if pain persists up to a total of 3 doses [name], MD Taking Active            Med Note (CANTER, Paula Libra   Wed Oct 07, 2022  1:18 PM) prn  ELIQUIS 5 MG TABS tablet [id number] No TAKE 1 TABLET BY MOUTH 2 TIMES A Kenn File, MD Taking Active   FLUoxetine (PROZAC) 20 MG tablet [id number] No Take 1 tablet (20 mg total) by mouth 2 (two) times daily. [name], MD Taking Active   fluticasone [facility]) 50 MCG/ACT nasal spray [id number] No Place 2 sprays into both nostrils daily.  Patient taking differently: Place 2 sprays into both nostrils as needed.   Saguier, Ramon Dredge, PA-C Taking Active   ketoconazole (NIZORAL) 2 % cream [id number] No Apply 1 Application topically daily. [name], MD Taking Active            Med Note Loreli Dollar, Holli Humbles Oct 07, 2022  1:18 PM) PRN  LORazepam (ATIVAN) 1 MG tablet [id number]  TAKE ONE TABLET BY MOUTH EVERY 8 HOURS AS NEEDED FOR ANXIETY Worthy Rancher B, FNP  Active   omeprazole (PRILOSEC) 20 MG capsule [id number] No Take 20 mg by mouth daily as needed (acid reflux).  [provider] Taking Active Self  tamsulosin (FLOMAX) 0.4 MG CAPS capsule [id number] No Take 1 capsule (0.4 mg total) by mouth daily after supper. Joline Maxcy, MD Taking Active   tiZANidine (ZANAFLEX) 2 MG tablet [id number] No Take 0.5-2 tablets (1-4 mg total) by mouth every 8 (eight) hours as needed for muscle spasms (joint pain). [name], MD Taking Active   triamcinolone (KENALOG) 0.025 % cream [id number] No  [provider] Taking Active             Home Care and Equipment/Supplies: Were Home Health Services Ordered?: NA Any new equipment or  medical supplies ordered?: NA  Functional Questionnaire: Do you need assistance with bathing/showering or dressing?: No Do you need assistance with meal preparation?: No Do you need assistance with eating?: No Do you have difficulty maintaining continence: No Do you need assistance with getting out of bed/getting out of a chair/moving?: No Do you have difficulty managing or taking your medications?: No  Follow up appointments reviewed: PCP Follow-up appointment confirmed?: Yes Date of PCP follow-up appointment?: 03/25/23 Follow-up Provider: Abner Greenspan (doesn't want separate appt for ER f/u) [facility] Follow-up appointment confirmed?: NA Do you need transportation to your follow-up appointment?: No Do you understand care options if your condition(s) worsen?: Yes-patient verbalized understanding    SIGNATURE Karena Addison, LPN [facility] Nurse Health Advisor Direct Dial [phone]

## 2023-03-19 ENCOUNTER — Other Ambulatory Visit: Payer: Self-pay | Admitting: Family Medicine

## 2023-03-21 NOTE — Progress Notes (Deleted)
  Cardiology Office Note:   Date:  03/21/2023  ID:  David [name], DOB 1945-08-23, MRN [id number] PCP: [name], MD  [facility] Providers Cardiologist:  [name], MD {  History of Present Illness:   David [name] is a 78 y.o. male who presents for follow up of atrial fib.  He has been seen in the Atrial Fib clinic.  He has had cardioversion.  He was in the ED with PAF a couple years ago.   Since then he has had no new cardiovascular complaints.  He thinks he would feel his atrial fibrillation and he has not.  He has not had any new urologic problems having had nephrectomy, prostate seed implants, kidney stones.    Since I last saw him ***   *** he has done okay.  He has had no tachypalpitations to indicate that he is having atrial fibrillation.  He has had no further chest discomfort.  He has had no new shortness of breath, PND or orthopnea.  He said no palpitations, presyncope or syncope.   He did have a CT demonstrating a stable aortic dilatation of 43 mm.  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    ROS: ***  Studies Reviewed:    EKG:       ***  Risk Assessment/Calculations:   {Does this patient have ATRIAL FIBRILLATION?[phone]} No BP recorded.  {Refresh Note OR Click here to enter BP  :1}***        Physical Exam:   VS:  There were no vitals taken for this visit.   Wt Readings from Last 3 Encounters:  03/15/23 240 lb (108.9 kg)  03/02/23 225 lb (102.1 kg)  02/03/23 235 lb (106.6 kg)     GEN: Well nourished, well developed in no acute distress NECK: No JVD; No carotid bruits CARDIAC: ***RR, *** murmurs, rubs, gallops RESPIRATORY:  Clear to auscultation without rales, wheezing or rhonchi  ABDOMEN: Soft, non-tender, non-distended EXTREMITIES:  No edema; No deformity   ASSESSMENT AND PLAN:   ATRIAL FIB:    David [name] has a CHA2DS2 - VASc score of  2.  ***He had no symptomatic recurrences.  He is up-to-date with blood work and tolerates anticoagulation.  No change in therapy.    HTN: Blood pressure is ***at target.  No change in therapy.    CKD IIIA: His creatinine is ***  1.9 and stable.  He has is followed very closely by urology as he only has 1 kidney.    AORTIC ROOT ENLARGEMENT:   This was 45 mm in Dec 2024.  I will follow this again in  *** earlier this month.  Other results as above and I will follow this up in 1 year.  I will probably repeat a CT of his abdomen in 2 years.   FATIGUE:  ***   do not see a cardiac etiology to this.  No further work-up is indicated.  He says he does not snore.      Follow up ***  Signed, [name], MD

## 2023-03-22 ENCOUNTER — Ambulatory Visit: Attending: Cardiology | Admitting: Cardiology

## 2023-03-22 DIAGNOSIS — N183 Chronic kidney disease, stage 3 unspecified: Secondary | ICD-10-CM

## 2023-03-22 DIAGNOSIS — I480 Paroxysmal atrial fibrillation: Secondary | ICD-10-CM

## 2023-03-22 DIAGNOSIS — I7789 Other specified disorders of arteries and arterioles: Secondary | ICD-10-CM

## 2023-03-22 DIAGNOSIS — I10 Essential (primary) hypertension: Secondary | ICD-10-CM

## 2023-03-22 NOTE — Assessment & Plan Note (Signed)
 Hydrate and monitor. Sees nephrology.

## 2023-03-22 NOTE — Assessment & Plan Note (Signed)
 Hydrate and monitor

## 2023-03-22 NOTE — Assessment & Plan Note (Signed)
 Encourage heart healthy diet such as MIND or DASH diet, increase exercise, avoid trans fats, simple carbohydrates and processed foods, consider a krill or fish or flaxseed oil cap daily.

## 2023-03-22 NOTE — Assessment & Plan Note (Signed)
 hgba1c acceptable, minimize simple carbs. Increase exercise as tolerated.

## 2023-03-22 NOTE — Assessment & Plan Note (Signed)
Uses Omeprazole roughly 4 x a month prn and gets some relief a few hours later.

## 2023-03-22 NOTE — Assessment & Plan Note (Signed)
 Well controlled, no changes to meds. Encouraged heart healthy diet such as the DASH diet and exercise as tolerated.

## 2023-03-23 ENCOUNTER — Telehealth: Payer: Self-pay | Admitting: Urology

## 2023-03-23 ENCOUNTER — Ambulatory Visit: Payer: Medicare Other | Admitting: Urology

## 2023-03-23 NOTE — Telephone Encounter (Signed)
 Pt called to confirm apt for today 03/23/23 which was canceled due to Dr Margo Aye being out of office. Lvm for pt to call back and reschedule pt was due for a 3-4 mth cysto.

## 2023-03-24 ENCOUNTER — Encounter: Payer: Self-pay | Admitting: Urology

## 2023-03-24 ENCOUNTER — Ambulatory Visit: Admitting: Urology

## 2023-03-24 VITALS — BP 174/81 | HR 62 | Ht 71.0 in | Wt 226.0 lb

## 2023-03-24 DIAGNOSIS — N481 Balanitis: Secondary | ICD-10-CM

## 2023-03-24 DIAGNOSIS — R3129 Other microscopic hematuria: Secondary | ICD-10-CM

## 2023-03-24 DIAGNOSIS — R399 Unspecified symptoms and signs involving the genitourinary system: Secondary | ICD-10-CM

## 2023-03-24 DIAGNOSIS — C61 Malignant neoplasm of prostate: Secondary | ICD-10-CM

## 2023-03-24 DIAGNOSIS — R82998 Other abnormal findings in urine: Secondary | ICD-10-CM | POA: Diagnosis not present

## 2023-03-24 DIAGNOSIS — R319 Hematuria, unspecified: Secondary | ICD-10-CM | POA: Diagnosis not present

## 2023-03-24 LAB — MICROSCOPIC EXAMINATION

## 2023-03-24 LAB — URINALYSIS, ROUTINE W REFLEX MICROSCOPIC
Bilirubin, UA: NEGATIVE
Glucose, UA: NEGATIVE
Ketones, UA: NEGATIVE
Leukocytes,UA: NEGATIVE
Nitrite, UA: NEGATIVE
Protein,UA: NEGATIVE
Specific Gravity, UA: 1.015 (ref 1.005–1.030)
Urobilinogen, Ur: 0.2 mg/dL (ref 0.2–1.0)
pH, UA: 6.5 (ref 5.0–7.5)

## 2023-03-24 NOTE — Progress Notes (Signed)
 Assessment: 1. Microscopic hematuria   2. Prostate cancer (HCC)   3. Lower urinary tract symptoms (LUTS)   4. Balanitis     Plan: Discussed cystoscopic findings today which did not demonstrate any significant issues. Hematuria profile Continue Lotrisone cream for balanitis as needed Continue tamsulosin for lower urinary tract symptoms Patient will follow-up as scheduled in July when he is due for his next ADT injection  Chief Complaint: No chief complaint on file.   HPI: David [name] is a 78 y.o. male who presents for continued evaluation of a number of urologic issues.  Please see my note 12/17/2022 as well as 02/03/2023 for detailed history and exam. These include: Recurrent prostate cancer status post brachytherapy now on ADT following salvage RT Recurrent balanitis which responds to Lotrisone cream Lower urinary tract symptoms on tamsulosin-stable  Patient is here today for cystoscopy for evaluation of microscopic hematuria.   Urinalysis today continues to show significant microscopic hematuria.  Patient had a noncontrasted CT 10/2022 which did not demonstrate any significant upper tract abnormality.  Cystoscopy today was negative for any significant GU findings.       Portions of the above documentation were copied from a prior visit for review purposes only.  Allergies: Allergies  Allergen Reactions   Cefaclor Hives   Cephalosporins Hives   Penicillins Rash    Mild maculopapular rash Has patient had a PCN reaction causing immediate rash, facial/tongue/throat swelling, SOB or lightheadedness with hypotension: No Has patient had a PCN reaction causing severe rash involving mucus membranes or skin necrosis: No Has patient had a PCN reaction that required hospitalization: No Has patient had a PCN reaction occurring within the last 10 years: No If all of the above answers are "NO", then may proceed with Cephalosporin use.     PMH: Past Medical History:   Diagnosis Date   Adenoma of right adrenal gland 07/11/2002   2.4cm , noted on CT ABD   Anxiety    Arthritis of both knees 03/26/2016   Astigmatism    Back pain    BCC (basal cell carcinoma of skin)    under right eye and right ear   Blood transfusion without reported diagnosis    Cataract 09/29/2016   Depression    Diverticulitis 2009   Diverticulosis    Family history of breast cancer    Family history of colon cancer    Fatty liver    GERD (gastroesophageal reflux disease)    Gout    Hearing loss of both ears 03/26/2016   History of atrial fibrillation    x 2 none since 2020   History of chronic prostatitis    started at age 71   History of kidney stones    Hx of adenomatous colonic polyps 02/16/2022   3 diminutive - no recall   Hyperglycemia    Hyperlipidemia    Hypertension    Incomplete right bundle branch block (RBBB)    Internal hemorrhoids    Kidney lesion 06/07/2015   Right midportion, 1.1x1.1 cm hyperechoic, noted on [place] ABD   LAFB (left anterior fascicular block)    Lipoma of axilla 09/2016   Liver lesion, right lobe 06/07/2015   2.5x2.4x2.4 cm hypoechoic lesion posterior aspect, noted on [place] ABD   Low back pain 03/26/2016   LVH (left ventricular hypertrophy) 08/06/2017   Moderate, Noted on ECHO   Medicare annual wellness visit, subsequent 03/12/2014   OA (osteoarthritis)    Back, Hands, Neck   Obesity 11/25/2007  Qualifier: Diagnosis of  By: Kriste Basque MD, Lonzo Cloud    Other malaise and fatigue 03/13/2013   Premature ventricular complex    Prostate cancer [facility]) dx 2021   Renal insufficiency    Kidney removed right   Scoliosis    Upper thoracic and lumbar   Sinus bradycardia 08/06/2017   Noted on EKG   Vitamin D deficiency    resolved    PSH: Past Surgical History:  Procedure Laterality Date   CARDIOVERSION  07/31/2017   CATARACT EXTRACTION, BILATERAL     COLON SURGERY  2009   segmental sigmoid resection   COLONOSCOPY     COLONOSCOPY WITH  PROPOFOL     CYSTOSCOPY N/A 07/28/2019   Procedure: CYSTOSCOPY FLEXIBLE;  Surgeon: Malen Gauze, MD;  [facility];  Service: Urology;  Laterality: N/A;   CYSTOSCOPY W/ RETROGRADES Right 06/07/2018   Procedure: CYSTOSCOPY WITH RETROGRADE PYELOGRAM, uphrostogram;  Surgeon: Malen Gauze, MD;  Location: [facility];  Service: Urology;  Laterality: Right;   CYSTOSCOPY WITH URETEROSCOPY AND STENT PLACEMENT Right 02/28/2018   Procedure: CYSTOSCOPY WITH URETEROSCOPY Iverson Alamin;  Surgeon: Ihor Gully, MD;  Location[facility];  Service: Urology;  Laterality: Right;   CYSTOSCOPY/URETEROSCOPY/HOLMIUM LASER/STENT PLACEMENT Right 11/29/2017   Procedure: CYSTOSCOPY/RETROGRADE/URETEROSCOPY/HOLMIUM LASER/STENT PLACEMENT;  Surgeon: Ihor Gully, MD;  Location: [facility];  Service: Urology;  Laterality: Right;   EYE SURGERY Bilateral 01/12/2017   cataract removal   history of blood tranfusion  age 68   HOLMIUM LASER APPLICATION Right 09/24/2017   Procedure: HOLMIUM LASER APPLICATION;  Surgeon: Ihor Gully, MD;  Location: [facility];  Service: Urology;  Laterality: Right;   IR BALLOON DILATION URETERAL STRICTURE RIGHT  03/14/2018   IR NEPHRO TUBE REMOV/FL  03/17/2018   IR NEPHROSTOGRAM RIGHT THRU EXISTING ACCESS  03/17/2018   IR NEPHROSTOMY EXCHANGE RIGHT  03/14/2018   IR NEPHROSTOMY EXCHANGE RIGHT  06/21/2018   IR NEPHROSTOMY PLACEMENT RIGHT  03/02/2018   IR NEPHROSTOMY PLACEMENT RIGHT  04/20/2018   IR URETERAL STENT PLACEMENT EXISTING ACCESS RIGHT  03/14/2018   NOSE SURGERY     Submucous resection age 32   NUCLEAR STRESS TEST  06/03/2009   RADIOACTIVE SEED IMPLANT N/A 07/28/2019   Procedure: RADIOACTIVE SEED IMPLANT/BRACHYTHERAPY IMPLANT;  Surgeon: Malen Gauze, MD;  Location: [facility];  Service: Urology;  Laterality: N/A;  90 MINS   ROBOT ASSISTED LAPAROSCOPIC NEPHRECTOMY Right 08/04/2018   Procedure: XI  ROBOTIC ASSISTED LAPAROSCOPIC NEPHRECTOMY;  Surgeon: Malen Gauze, MD;  Location: [facility];  Service: Urology;  Laterality: Right;  3 HRS   ROBOT ASSISTED PYELOPLASTY Right 06/07/2018   Procedure: attempted XI ROBOTIC ASSISTED PYELOPLASTY, lysis of adhesions;  Surgeon: Malen Gauze, MD;  Location: [facility];  Service: Urology;  Laterality: Right;  3 HRS   SKIN BIOPSY     SPACE OAR INSTILLATION N/A 07/28/2019   Procedure: SPACE OAR INSTILLATION;  Surgeon: Malen Gauze, MD;  Location: [facility];  Service: Urology;  Laterality: N/A;   URETEROSCOPY WITH HOLMIUM LASER LITHOTRIPSY Right 09/24/2017   Procedure: CYSTOSCOPY, URETEROSCOPY WITH HOLMIUM LASER LITHOTRIPSY, STENT PLACEMENT;  Surgeon: Ihor Gully, MD;  Location: [facility];  Service: Urology;  Laterality: Right;    SH: Social History   Tobacco Use   Smoking status: Former    Current packs/day: 0.00    Average packs/day: 1 pack/day for 6.0 years (6.0 ttl pk-yrs)    Types: Cigarettes  Start date: 01/11/1969    Quit date: 01/12/1969    Years since quitting: 54.2   Smokeless tobacco: Never   Tobacco comments:    1965-1971  Vaping Use   Vaping status: Never Used  Substance Use Topics   Alcohol use: Not Currently    Comment: 2 whiskey drinks per day   Drug use: Never    ROS: Constitutional:  Negative for fever, chills, weight loss CV: Negative for chest pain, previous MI, hypertension Respiratory:  Negative for shortness of breath, wheezing, sleep apnea, frequent cough GI:  Negative for nausea, vomiting, bloody stool, GERD  PE: BP (!) 174/81   Pulse 62   Ht 5\' 11"  (1.803 m)   Wt 226 lb (102.5 kg)   BMI 31.52 kg/m  GENERAL APPEARANCE:  Well appearing, well developed, well nourished, NAD    Results: Urinalysis today is remarkable for significant microhematuria (11-15 RBCs/hpf)   Procedure: Cystourethroscopy  Indication: Microscopic hematuria/lower urinary tract  symptoms  Description of procedure: Patient was brought to the procedure room which was again described to the patient and informed consent was obtained.  The patient was then placed supine on the procedure table and his external genitalia were prepped and draped in the usual fashion.  Preprocedural timeout was performed.  Flexible cystoscopy demonstrated a normal anterior urethra.  The prostatic urethra revealed some mild lateral lobe enlargement not visually obstructive.  There was some friability along the prostatic urethra.  The bladder was subsequently entered and carefully and systematically visualized.  The ureteral openings appeared normal.  There were no focal mucosal lesions or other significant findings.  Procedure well-tolerated

## 2023-03-25 ENCOUNTER — Encounter: Payer: Self-pay | Admitting: Family Medicine

## 2023-03-25 ENCOUNTER — Ambulatory Visit (INDEPENDENT_AMBULATORY_CARE_PROVIDER_SITE_OTHER): Payer: Medicare Other | Admitting: Family Medicine

## 2023-03-25 VITALS — BP 144/82 | HR 62 | Temp 97.7°F | Resp 16 | Ht 71.0 in | Wt 227.8 lb

## 2023-03-25 DIAGNOSIS — I10 Essential (primary) hypertension: Secondary | ICD-10-CM | POA: Diagnosis not present

## 2023-03-25 DIAGNOSIS — N189 Chronic kidney disease, unspecified: Secondary | ICD-10-CM

## 2023-03-25 DIAGNOSIS — M1A9XX0 Chronic gout, unspecified, without tophus (tophi): Secondary | ICD-10-CM

## 2023-03-25 DIAGNOSIS — Z23 Encounter for immunization: Secondary | ICD-10-CM | POA: Diagnosis not present

## 2023-03-25 DIAGNOSIS — M25511 Pain in right shoulder: Secondary | ICD-10-CM | POA: Diagnosis not present

## 2023-03-25 DIAGNOSIS — R252 Cramp and spasm: Secondary | ICD-10-CM | POA: Diagnosis not present

## 2023-03-25 DIAGNOSIS — K219 Gastro-esophageal reflux disease without esophagitis: Secondary | ICD-10-CM

## 2023-03-25 DIAGNOSIS — E782 Mixed hyperlipidemia: Secondary | ICD-10-CM | POA: Diagnosis not present

## 2023-03-25 DIAGNOSIS — R739 Hyperglycemia, unspecified: Secondary | ICD-10-CM | POA: Diagnosis not present

## 2023-03-25 DIAGNOSIS — G8929 Other chronic pain: Secondary | ICD-10-CM | POA: Diagnosis not present

## 2023-03-25 NOTE — Patient Instructions (Addendum)
 Biofreeze topical cream at a local pharmacy  CBD Daily menthol cream. [employer] is the website we order from  Try these for pain  Try a multivitamin with minerals daily and a fatty acid supplement such as Fish or Krill oil capsule daily.  Tetanus at pharmacy   Shingrix is the new shingles shot, 2 shots over 2-6 months, confirm coverage with insurance and document, then can return here for shots with nurse appt or at pharmacy   RSV, Respiratory Syncitial Virus Vaccine Arexvy at pharmacy  Prevnar 20 once at pharmacy  Do 2 at a time at the pharmacy and will give flu shot in office today  COVID Booster twice annually

## 2023-03-28 NOTE — Progress Notes (Signed)
 Subjective:    Patient ID: David [name], male    DOB: [birth year], 78 y.o.   MRN: [id number]  Chief Complaint  Patient presents with   Follow-up    HPI Discussed the use of AI scribe software for clinical note transcription with the patient, who gave verbal consent to proceed.  History of Present Illness The patient presents with left lower quadrant pain and urinary symptoms. He is accompanied by his wife, Holiday representative.  He has experienced persistent left lower quadrant abdominal pain for the past three months. The pain is steady, rated 2-3/10, and does not change with position, time of day, bowel movements, or urination. It improves with the removal of tight clothing and does not radiate. He has slow bowel movements but no significant changes. He denies severe pain typical of kidney stones.  He has urinary symptoms, including hematuria and sensitive cuts on the side of his penis. He is scheduled for a PSA test next week. A cystoscopy performed by a neurologist, Dr. Margo Aye, showed no concerning findings.  He has pain in both knees and his right shoulder, which he injured last June while assisting his wife. The shoulder pain is intermittent and improving. He has a history of arthritis and has had previous treatments for his knees.  He experiences memory issues, including trouble remembering words and conversations, which he finds irritating but manageable. He has seen a neurologist, Dr. Vanessa Kick, for these concerns and is scheduled for a follow-up in June.  He has a history of high blood pressure readings at home, with recent measurements around 140/80. He acknowledges not getting enough exercise and is considering getting a new blood pressure cuff to monitor his readings more accurately.    Past Medical History:  Diagnosis Date   Adenoma of right adrenal gland 07/11/2002   2.4cm , noted on CT ABD   Anxiety    Arthritis of both knees 03/26/2016   Astigmatism    Back pain    BCC (basal cell  carcinoma of skin)    under right eye and right ear   Blood transfusion without reported diagnosis    Cataract 09/29/2016   Depression    Diverticulitis 2009   Diverticulosis    Family history of breast cancer    Family history of colon cancer    Fatty liver    GERD (gastroesophageal reflux disease)    Gout    Hearing loss of both ears 03/26/2016   History of atrial fibrillation    x 2 none since 2020   History of chronic prostatitis    started at age 26   History of kidney stones    Hx of adenomatous colonic polyps 02/16/2022   3 diminutive - no recall   Hyperglycemia    Hyperlipidemia    Hypertension    Incomplete right bundle branch block (RBBB)    Internal hemorrhoids    Kidney lesion 06/07/2015   Right midportion, 1.1x1.1 cm hyperechoic, noted on [place] ABD   LAFB (left anterior fascicular block)    Lipoma of axilla 09/2016   Liver lesion, right lobe 06/07/2015   2.5x2.4x2.4 cm hypoechoic lesion posterior aspect, noted on [place] ABD   Low back pain 03/26/2016   LVH (left ventricular hypertrophy) 08/06/2017   Moderate, Noted on ECHO   Medicare annual wellness visit, subsequent 03/12/2014   OA (osteoarthritis)    Back, Hands, Neck   Obesity 11/25/2007   Qualifier: Diagnosis of  By: Kriste Basque MD, Lonzo Cloud    Other  malaise and fatigue 03/13/2013   Premature ventricular complex    Prostate cancer (HCC) dx 2021   Renal insufficiency    Kidney removed right   Scoliosis    Upper thoracic and lumbar   Sinus bradycardia 08/06/2017   Noted on EKG   Vitamin D deficiency    resolved    Past Surgical History:  Procedure Laterality Date   CARDIOVERSION  07/31/2017   CATARACT EXTRACTION, BILATERAL     COLON SURGERY  2009   segmental sigmoid resection   COLONOSCOPY     COLONOSCOPY WITH PROPOFOL     CYSTOSCOPY N/A 07/28/2019   Procedure: CYSTOSCOPY FLEXIBLE;  Surgeon: Malen Gauze, MD;  Location: [facility];  Service: Urology;  Laterality: N/A;   CYSTOSCOPY  W/ RETROGRADES Right 06/07/2018   Procedure: CYSTOSCOPY WITH RETROGRADE PYELOGRAM, uphrostogram;  Surgeon: Malen Gauze, MD;  Location: [facility];  Service: Urology;  Laterality: Right;   CYSTOSCOPY WITH URETEROSCOPY AND STENT PLACEMENT Right 02/28/2018   Procedure: CYSTOSCOPY WITH URETEROSCOPY Iverson Alamin;  Surgeon: Ihor Gully, MD;  Location: [facility];  Service: Urology;  Laterality: Right;   CYSTOSCOPY/URETEROSCOPY/HOLMIUM LASER/STENT PLACEMENT Right 11/29/2017   Procedure: CYSTOSCOPY/RETROGRADE/URETEROSCOPY/HOLMIUM LASER/STENT PLACEMENT;  Surgeon: Ihor Gully, MD;  Location: [facility];  Service: Urology;  Laterality: Right;   EYE SURGERY Bilateral 01/12/2017   cataract removal   history of blood tranfusion  age 65   HOLMIUM LASER APPLICATION Right 09/24/2017   Procedure: HOLMIUM LASER APPLICATION;  Surgeon: Ihor Gully, MD;  Location: [facility];  Service: Urology;  Laterality: Right;   IR BALLOON DILATION URETERAL STRICTURE RIGHT  03/14/2018   IR NEPHRO TUBE REMOV/FL  03/17/2018   IR NEPHROSTOGRAM RIGHT THRU EXISTING ACCESS  03/17/2018   IR NEPHROSTOMY EXCHANGE RIGHT  03/14/2018   IR NEPHROSTOMY EXCHANGE RIGHT  06/21/2018   IR NEPHROSTOMY PLACEMENT RIGHT  03/02/2018   IR NEPHROSTOMY PLACEMENT RIGHT  04/20/2018   IR URETERAL STENT PLACEMENT EXISTING ACCESS RIGHT  03/14/2018   NOSE SURGERY     Submucous resection age 22   NUCLEAR STRESS TEST  06/03/2009   RADIOACTIVE SEED IMPLANT N/A 07/28/2019   Procedure: RADIOACTIVE SEED IMPLANT/BRACHYTHERAPY IMPLANT;  Surgeon: Malen Gauze, MD;  Location: [facility];  Service: Urology;  Laterality: N/A;  90 MINS   ROBOT ASSISTED LAPAROSCOPIC NEPHRECTOMY Right 08/04/2018   Procedure: XI ROBOTIC ASSISTED LAPAROSCOPIC NEPHRECTOMY;  Surgeon: Malen Gauze, MD;  Location: [facility];  Service: Urology;  Laterality: Right;  3 HRS   ROBOT ASSISTED PYELOPLASTY Right 06/07/2018   Procedure:  attempted XI ROBOTIC ASSISTED PYELOPLASTY, lysis of adhesions;  Surgeon: Malen Gauze, MD;  Location: [facility];  Service: Urology;  Laterality: Right;  3 HRS   SKIN BIOPSY     SPACE OAR INSTILLATION N/A 07/28/2019   Procedure: SPACE OAR INSTILLATION;  Surgeon: Malen Gauze, MD;  Location[facility];  Service: Urology;  Laterality: N/A;   URETEROSCOPY WITH HOLMIUM LASER LITHOTRIPSY Right 09/24/2017   Procedure: CYSTOSCOPY, URETEROSCOPY WITH HOLMIUM LASER LITHOTRIPSY, STENT PLACEMENT;  Surgeon: Ihor Gully, MD;  Location: [facility];  Service: Urology;  Laterality: Right;    Family History  Problem Relation Age of Onset   Heart disease Mother    Hypertension Mother    Stroke Mother    Colon cancer Mother 81   Breast cancer Mother 58   Heart disease Father        pacemaker   Aortic aneurysm  Father    Hypertension Father    Lung cancer Father 33   Heart disease Sister    Atrial fibrillation Sister    Obesity Sister    Sleep apnea Sister    Heart attack Brother    Other Brother        muscle disease   Arthritis Brother    Stroke Brother    Atrial fibrillation Brother    Heart attack Brother    Atrial fibrillation Brother    Diabetes Brother    Atrial fibrillation Brother    Heart attack Brother    Hypertension Brother    Hyperlipidemia Brother    Heart attack Brother    Other Brother        heart valve operation   Atrial fibrillation Brother    Heart attack Maternal Grandmother    Diabetes Maternal Grandmother    Cancer Maternal Grandfather 44       hodgin's lymphoma   Heart attack Paternal Grandmother    Anxiety disorder Paternal Grandmother    Pneumonia Paternal Grandfather    Liver cancer Nephew 2       great nephew; d. 7   Lymphoma Niece 20   Esophageal cancer Neg Hx    Rectal cancer Neg Hx    Stomach cancer Neg Hx    Colon polyps Neg Hx    Pancreatic cancer Neg Hx     Social History   Socioeconomic History    Marital status: Married    Spouse name: Not on file   Number of children: 3   Years of education: Not on file   Highest education level: Bachelor's degree (e.g., BA, AB, BS)  Occupational History   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    Employer: [employer]  Tobacco Use   Smoking status: Former    Current packs/day: 0.00    Average packs/day: 1 pack/day for 6.0 years (6.0 ttl pk-yrs)    Types: Cigarettes    Start date: 01/11/1969    Quit date: 01/12/1969    Years since quitting: 54.2   Smokeless tobacco: Never   Tobacco comments:    1965-1971  Vaping Use   Vaping status: Never Used  Substance and Sexual Activity   Alcohol use: Not Currently    Comment: 2 whiskey drinks per day   Drug use: Never   Sexual activity: Yes  Other Topics Concern   Not on file  Social History Narrative   The patient is married for the second time, he has 3 sons.   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   2 alcoholic beverages most days.   1 caffeinated beverage daily   No drug use no current tobacco use he is a prior smoker   12/24/2016   Social Drivers of Health   Financial Resource Strain: Medium Risk (03/02/2023)   Overall Financial Resource Strain (CARDIA)    Difficulty of Paying Living Expenses: Somewhat hard  Food Insecurity: Food Insecurity Present (03/02/2023)   Hunger Vital Sign    Worried About Running Out of Food in the Last Year: Sometimes true    Ran Out of Food in the Last Year: Never true  Transportation Needs: No Transportation Needs (03/02/2023)   PRAPARE - Administrator, Civil Service (Medical): No    Lack of Transportation (Non-Medical): No  Physical Activity: Inactive (03/02/2023)   Exercise Vital Sign    Days of Exercise per Week: 0 days    Minutes of Exercise per Session: 0 min  Stress: Stress  Concern Present (03/02/2023)   [employer] of Occupational Health - Occupational Stress Questionnaire    Feeling of Stress : Very much  Social Connections: Moderately Isolated  (03/02/2023)   Social Connection and Isolation Panel [NHANES]    Frequency of Communication with Friends and Family: More than three times a week    Frequency of Social Gatherings with Friends and Family: Twice a week    Attends Religious Services: Never    Database administrator or Organizations: No    Attends Engineer, structural: Not on file    Marital Status: Married  Catering manager Violence: Not At Risk (03/02/2023)   Humiliation, Afraid, Rape, and Kick questionnaire    Fear of Current or Ex-Partner: No    Emotionally Abused: No    Physically Abused: No    Sexually Abused: No    Outpatient Medications Prior to Visit  Medication Sig Dispense Refill   acetaminophen (TYLENOL) 325 MG tablet Take 2 tablets (650 mg total) by mouth every 6 (six) hours as needed. 36 tablet 0   allopurinol (ZYLOPRIM) 100 MG tablet TAKE 1/2 TABLET BY MOUTH DAILY 45 tablet 1   ascorbic acid (VITAMIN C) 1000 MG tablet Take by mouth.     atenolol (TENORMIN) 50 MG tablet Take 1 tablet (50 mg total) by mouth 2 (two) times daily. 180 tablet 1   atorvastatin (LIPITOR) 10 MG tablet Take 1 tablet (10 mg total) by mouth every morning. 90 tablet 1   Cholecalciferol (VITAMIN D-1000 MAX ST) 25 MCG (1000 UT) tablet Take by mouth.     clotrimazole-betamethasone (LOTRISONE) cream Apply 1 Application topically 2 (two) times daily. 45 g 1   colchicine 0.6 MG tablet For gout flares, take 1 tab po once then repeat in 1 hour as needed if pain persists up to a total of 3 doses 15 tablet 0   ELIQUIS 5 MG TABS tablet TAKE 1 TABLET BY MOUTH 2 TIMES A DAY 60 tablet 5   FLUoxetine (PROZAC) 20 MG tablet Take 1 tablet (20 mg total) by mouth 2 (two) times daily. 180 tablet 1   fluticasone (FLONASE) 50 MCG/ACT nasal spray Place 2 sprays into both nostrils daily. (Patient taking differently: Place 2 sprays into both nostrils as needed.) 16 g 1   ketoconazole (NIZORAL) 2 % cream APPLY A THIN LAYER TOPICALLY DAILY 60 g 2   LORazepam  (ATIVAN) 1 MG tablet TAKE ONE TABLET BY MOUTH EVERY 8 HOURS AS NEEDED FOR ANXIETY 90 tablet 0   omeprazole (PRILOSEC) 20 MG capsule Take 20 mg by mouth daily as needed (acid reflux).      tamsulosin (FLOMAX) 0.4 MG CAPS capsule Take 1 capsule (0.4 mg total) by mouth daily after supper. 90 capsule 0   tiZANidine (ZANAFLEX) 2 MG tablet Take 0.5-2 tablets (1-4 mg total) by mouth every 8 (eight) hours as needed for muscle spasms (joint pain). 40 tablet 1   triamcinolone (KENALOG) 0.025 % cream  (Patient not taking: Reported on 03/25/2023)     No facility-administered medications prior to visit.    Allergies  Allergen Reactions   Cefaclor Hives   Cephalosporins Hives   Penicillins Rash    Mild maculopapular rash Has patient had a PCN reaction causing immediate rash, facial/tongue/throat swelling, SOB or lightheadedness with hypotension: No Has patient had a PCN reaction causing severe rash involving mucus membranes or skin necrosis: No Has patient had a PCN reaction that required hospitalization: No Has patient had a PCN reaction occurring  within the last 10 years: No If all of the above answers are "NO", then may proceed with Cephalosporin use.     Review of Systems  Constitutional:  Positive for malaise/fatigue. Negative for fever.  HENT:  Negative for congestion.   Eyes:  Negative for blurred vision.  Respiratory:  Negative for shortness of breath.   Cardiovascular:  Negative for chest pain, palpitations and leg swelling.  Gastrointestinal:  Positive for abdominal pain. Negative for blood in stool and nausea.  Genitourinary:  Positive for frequency and urgency. Negative for dysuria.  Musculoskeletal:  Positive for back pain, joint pain and myalgias. Negative for falls.  Skin:  Negative for rash.  Neurological:  Negative for dizziness, loss of consciousness and headaches.  Endo/Heme/Allergies:  Negative for environmental allergies.  Psychiatric/Behavioral:  Negative for depression. The  patient is not nervous/anxious.        Objective:    Physical Exam Vitals reviewed.  Constitutional:      Appearance: Normal appearance. He is not ill-appearing.  HENT:     Head: Normocephalic and atraumatic.     Nose: Nose normal.  Eyes:     Conjunctiva/sclera: Conjunctivae normal.  Cardiovascular:     Rate and Rhythm: Normal rate.     Pulses: Normal pulses.     Heart sounds: Normal heart sounds. No murmur heard. Pulmonary:     Effort: Pulmonary effort is normal.     Breath sounds: Normal breath sounds. No wheezing.  Abdominal:     Palpations: Abdomen is soft. There is no mass.     Tenderness: There is no abdominal tenderness.  Musculoskeletal:     Cervical back: Normal range of motion.     Right lower leg: No edema.     Left lower leg: No edema.  Skin:    General: Skin is warm and dry.  Neurological:     General: No focal deficit present.     Mental Status: He is alert and oriented to person, place, and time.  Psychiatric:        Mood and Affect: Mood normal.     BP (!) 144/82 (BP Location: Left Arm, Patient Position: Sitting, Cuff Size: Large)   Pulse 62   Temp 97.7 F (36.5 C) (Oral)   Resp 16   Ht 5\' 11"  (1.803 m)   Wt 227 lb 12.8 oz (103.3 kg)   SpO2 94%   BMI 31.77 kg/m  Wt Readings from Last 3 Encounters:  03/25/23 227 lb 12.8 oz (103.3 kg)  03/24/23 226 lb (102.5 kg)  03/15/23 240 lb (108.9 kg)    Diabetic Foot Exam - Simple   No data filed    Lab Results  Component Value Date   WBC 3.9 (L) 12/01/2022   HGB 11.6 (L) 12/01/2022   HCT 34.6 (L) 12/01/2022   PLT 156.0 12/01/2022   GLUCOSE 99 11/29/2022   CHOL 112 10/26/2022   TRIG 201.0 (H) 10/26/2022   HDL 35.80 (L) 10/26/2022   LDLDIRECT 168.5 03/29/2006   LDLCALC 36 10/26/2022   ALT 47 (H) 11/29/2022   AST 42 (H) 11/29/2022   NA 138 11/29/2022   K 3.9 11/29/2022   CL 106 11/29/2022   CREATININE 1.47 (H) 11/29/2022   BUN 14 11/29/2022   CO2 24 11/29/2022   TSH 4.16 10/26/2022    PSA 4.01 (H) 07/16/2021   INR 1.0 07/25/2019   HGBA1C 5.9 10/26/2022    Lab Results  Component Value Date   TSH 4.16 10/26/2022  Lab Results  Component Value Date   WBC 3.9 (L) 12/01/2022   HGB 11.6 (L) 12/01/2022   HCT 34.6 (L) 12/01/2022   MCV 97.7 12/01/2022   PLT 156.0 12/01/2022   Lab Results  Component Value Date   NA 138 11/29/2022   K 3.9 11/29/2022   CO2 24 11/29/2022   GLUCOSE 99 11/29/2022   BUN 14 11/29/2022   CREATININE 1.47 (H) 11/29/2022   BILITOT 0.9 11/29/2022   ALKPHOS 74 11/29/2022   AST 42 (H) 11/29/2022   ALT 47 (H) 11/29/2022   PROT 6.6 11/29/2022   ALBUMIN 3.6 11/29/2022   CALCIUM 10.3 11/29/2022   ANIONGAP 8 11/29/2022   EGFR 37 (L) 12/18/2021   GFR 41.63 (L) 10/26/2022   Lab Results  Component Value Date   CHOL 112 10/26/2022   Lab Results  Component Value Date   HDL 35.80 (L) 10/26/2022   Lab Results  Component Value Date   LDLCALC 36 10/26/2022   Lab Results  Component Value Date   TRIG 201.0 (H) 10/26/2022   Lab Results  Component Value Date   CHOLHDL 3 10/26/2022   Lab Results  Component Value Date   HGBA1C 5.9 10/26/2022       Assessment & Plan:  Chronic renal impairment, unspecified CKD stage Assessment & Plan: Hydrate and monitor. Sees nephrology.    Essential hypertension, benign Assessment & Plan: Well controlled, no changes to meds. Encouraged heart healthy diet such as the DASH diet and exercise as tolerated.   Orders: -     Comprehensive metabolic panel -     CBC with Differential/Platelet -     TSH  Chronic gout without tophus, unspecified cause, unspecified site Assessment & Plan: Hydrate and monitor   Orders: -     Uric acid  Gastroesophageal reflux disease without esophagitis Assessment & Plan: Uses Omeprazole roughly 4 x a month prn and gets some relief a few hours later.    Hyperglycemia Assessment & Plan: hgba1c acceptable, minimize simple carbs. Increase exercise as  tolerated.  Orders: -     Hemoglobin A1c  Hyperlipidemia, mixed Assessment & Plan: Encourage heart healthy diet such as MIND or DASH diet, increase exercise, avoid trans fats, simple carbohydrates and processed foods, consider a krill or fish or flaxseed oil cap daily.   Orders: -     Lipid panel  Muscle cramp Assessment & Plan: Hydrate and monitor    Chronic right shoulder pain -     DG Shoulder Right; Future  Need for influenza vaccination -     Flu Vaccine Trivalent High Dose (Fluad)    Assessment and Plan Assessment & Plan Left lower quadrant pain Persistent low-level pain, likely musculoskeletal. No severe symptoms of nephrolithiasis. - Consider topical rubs such as Biofreeze or CBD cream for symptomatic relief.  Right shoulder pain Intermittent pain, likely musculoskeletal. Previous x-ray not completed. Maintaining mobility crucial. - Order x-ray of the right shoulder for further evaluation. - Encourage range of motion exercises to prevent adhesive capsulitis.  Hypertension Blood pressure elevated at 140/80 mmHg. Lifestyle modifications recommended. Possible hydralazine initiation. Cardiologist consultation planned. - Recommend purchasing a new blood pressure cuff (Omron). - Monitor blood pressure at home twice a week and report readings. - Consider starting hydralazine if blood pressure remains elevated. - Consult with cardiologist Dr. Ames Coupe for further management.  Cognitive impairment Memory issues and confusion reported. Follow-up with NP scheduled. No specific interventions from neurology. - Attend follow-up appointment with Derwood Kaplan, NP, in  June for further evaluation.  Gout No recent flares. Uric acid levels monitored. - Continue current allopurinol regimen. - Monitor uric acid levels in blood work.  Vaccinations Due for multiple vaccinations. RSV vaccine reduces hospitalizations significantly in those over 65. COVID booster recommended  biannually. - Administer high-dose influenza vaccine in office today. - Recommend receiving tetanus, shingles, RSV, and Prevnar 20 vaccines at the pharmacy, two at a time. - Recommend COVID booster, ideally twice annually.  Follow-up Multiple follow-up appointments and tests scheduled. - Attend ophthalmology appointment next week. - Attend follow-up with neurologist in June. - Schedule appointment with Dr. Ames Coupe for cardiac evaluation. - Consider orthopedic consultation for knee injections if needed.     Danise Edge, MD

## 2023-03-29 ENCOUNTER — Emergency Department (HOSPITAL_COMMUNITY)

## 2023-03-29 ENCOUNTER — Other Ambulatory Visit: Payer: Self-pay

## 2023-03-29 ENCOUNTER — Emergency Department (HOSPITAL_COMMUNITY)
Admission: EM | Admit: 2023-03-29 | Discharge: 2023-03-29 | Disposition: A | Discharge status: Home / Self Care | Attending: Emergency Medicine | Admitting: Emergency Medicine

## 2023-03-29 ENCOUNTER — Encounter: Payer: Self-pay | Admitting: Urology

## 2023-03-29 DIAGNOSIS — Z7901 Long term (current) use of anticoagulants: Secondary | ICD-10-CM | POA: Diagnosis not present

## 2023-03-29 DIAGNOSIS — M47816 Spondylosis without myelopathy or radiculopathy, lumbar region: Secondary | ICD-10-CM | POA: Diagnosis not present

## 2023-03-29 DIAGNOSIS — M25552 Pain in left hip: Secondary | ICD-10-CM | POA: Diagnosis not present

## 2023-03-29 DIAGNOSIS — M25551 Pain in right hip: Secondary | ICD-10-CM

## 2023-03-29 DIAGNOSIS — M160 Bilateral primary osteoarthritis of hip: Secondary | ICD-10-CM | POA: Diagnosis not present

## 2023-03-29 MED ORDER — NAPROXEN 375 MG PO TABS: 375.0000 mg | ORAL_TABLET | Freq: Two times a day (BID) | ORAL | 0 refills | Status: DC

## 2023-03-29 NOTE — Discharge Instructions (Addendum)
 Contact a health care provider if: You cannot put weight on your leg. Your pain or swelling gets worse after a week. It gets harder to walk. You have a fever. Get help right away if: You fall. You have a sudden increase in pain and swelling in your hip. Your hip is red or swollen or very tender to touch. This information is not intended to replace advice given to you by your health care provider. Make sure you discuss any questions you have with your health care provider.

## 2023-03-29 NOTE — ED Provider Notes (Signed)
 [place] EMERGENCY DEPARTMENT AT [facility] Provider Note   CSN: [id number] Arrival date & time: 03/29/23  1035     History  Chief Complaint  Patient presents with   Leg Pain    PHU RECORD is a 78 y.o. male who presents emergency department with a chief complaint of left hip pain.  Patient complains of history of intermittent pain in the left hip.  He points to the posterior upper femur region on the left side.  He states he has had pain like this in the past but usually goes this way overnight.  He is taken naproxen, Tylenol, Advil with relief.  He states that the pain is localized to the region of the gluteus attachment on the posterior femur.  It does not radiate.  It is worse when he bears weight and he feels like his leg is "going to give out.".  He states he has never had it be quite as severe as this visit.  He denies back pain groin pain.  He is able to ambulate with a cane.  He did not try anything prior to coming in for evaluation today.  He had onset of this pain last night.   Leg Pain      Home Medications Prior to Admission medications   Medication Sig Start Date End Date Taking? Authorizing Provider  acetaminophen (TYLENOL) 325 MG tablet Take 2 tablets (650 mg total) by mouth every 6 (six) hours as needed. 08/13/22   Sloan Leiter, DO  allopurinol (ZYLOPRIM) 100 MG tablet TAKE 1/2 TABLET BY MOUTH DAILY 12/02/22   [name], MD  ascorbic acid (VITAMIN C) 1000 MG tablet Take by mouth.    [provider]  atenolol (TENORMIN) 50 MG tablet Take 1 tablet (50 mg total) by mouth 2 (two) times daily. 07/17/22   [name], MD  atorvastatin (LIPITOR) 10 MG tablet Take 1 tablet (10 mg total) by mouth every morning. 10/26/22   [name], MD  Cholecalciferol (VITAMIN D-1000 MAX ST) 25 MCG (1000 UT) tablet Take by mouth.    [provider]  clotrimazole-betamethasone (LOTRISONE) cream Apply 1 Application topically 2 (two) times  daily. 12/17/22   Joline Maxcy, MD  colchicine 0.6 MG tablet For gout flares, take 1 tab po once then repeat in 1 hour as needed if pain persists up to a total of 3 doses 05/20/20   [name], MD  ELIQUIS 5 MG TABS tablet TAKE 1 TABLET BY MOUTH 2 TIMES A DAY 12/07/22   [name], MD  FLUoxetine (PROZAC) 20 MG tablet Take 1 tablet (20 mg total) by mouth 2 (two) times daily. 12/08/22   [name], MD  fluticasone (FLONASE) 50 MCG/ACT nasal spray Place 2 sprays into both nostrils daily. Patient taking differently: Place 2 sprays into both nostrils as needed. 06/29/22   Saguier, Ramon Dredge, PA-C  ketoconazole (NIZORAL) 2 % cream APPLY A THIN LAYER TOPICALLY DAILY 03/19/23   Worthy Rancher B, FNP  LORazepam (ATIVAN) 1 MG tablet TAKE ONE TABLET BY MOUTH EVERY 8 HOURS AS NEEDED FOR ANXIETY 02/22/23   Worthy Rancher B, FNP  omeprazole (PRILOSEC) 20 MG capsule Take 20 mg by mouth daily as needed (acid reflux).     [provider]  tamsulosin (FLOMAX) 0.4 MG CAPS capsule Take 1 capsule (0.4 mg total) by mouth daily after supper. 12/17/22   Joline Maxcy, MD  tiZANidine (ZANAFLEX) 2 MG tablet Take 0.5-2 tablets (  1-4 mg total) by mouth every 8 (eight) hours as needed for muscle spasms (joint pain). 07/09/22   [name], MD  triamcinolone (KENALOG) 0.025 % cream  12/24/21   [provider]      Allergies    Cefaclor, Cephalosporins, and Penicillins    Review of Systems   Review of Systems  Physical Exam Updated Vital Signs BP (!) 167/85 (BP Location: Right Arm)   Pulse 65   Temp 98.4 F (36.9 C) (Oral)   Resp 16   SpO2 98%  Physical Exam Physical Exam  Nursing note and vitals reviewed. Constitutional: He appears well-developed and well-nourished. No distress.  HENT:  Head: Normocephalic and atraumatic.  Eyes: Conjunctivae normal are normal. No scleral icterus.  Neck: Normal range of motion. Neck supple.  Cardiovascular: Normal rate, regular rhythm and normal  heart sounds.   Pulmonary/Chest: Effort normal and breath sounds normal. No respiratory distress.  Abdominal: Soft. There is no tenderness.  Musculoskeletal: Left hip examination exhibits no edema.  No marked deformities, no tenderness over the trochanter or posterior upper femur.  Patient expresses relief of symptoms with palpation of the gluteal muscles.  He has full active and passive range of motion at the hip knee and ankle. Neurological: He is alert.  Skin: Skin is warm and dry. He is not diaphoretic.  Psychiatric: His behavior is normal.   ED Results / Procedures / Treatments   Labs (all labs ordered are listed, but only abnormal results are displayed) Labs Reviewed - No data to display  EKG None  Radiology No results found.  Procedures Procedures    Medications Ordered in ED Medications - No data to display  ED Course/ Medical Decision Making/ A&P                                 Medical Decision Making Amount and/or Complexity of Data Reviewed Radiology: ordered.   78 year old male who presents with hip pain.  Patient is ambulatory.  Notably he has a mildly elevated blood pressure.  Advise follow-up with PCP.  Generalized and interpreted patient's left hip x-ray.  He appears to have some mild arthritis and subchondral sclerosis.  Also evident his previous prostate seeding.  Hips look equal bilaterally, no evidence of AVN.  There has not been an official read with radiology however in shared decision making with the patient he is comfortable going home at this time with anti-inflammatories and outpatient follow-up with orthopedics.  I plan on following up on the official read to make sure there are no discrepancies and will contact the patient directly should there be 1.  He is comfortable with this plan and appears otherwise appropriate for discharge at this time        Final Clinical Impression(s) / ED Diagnoses Final diagnoses:  None    Rx / DC Orders ED  Discharge Orders     None         Arthor Captain, PA-C 03/29/23 1412    Tegeler, Canary Brim, MD 03/29/23 [phone]

## 2023-03-29 NOTE — ED Triage Notes (Signed)
 Pt arrived via POV. C/o L leg pain for over a month.  Aox4

## 2023-03-31 ENCOUNTER — Other Ambulatory Visit (HOSPITAL_BASED_OUTPATIENT_CLINIC_OR_DEPARTMENT_OTHER): Payer: Self-pay

## 2023-03-31 ENCOUNTER — Emergency Department (HOSPITAL_BASED_OUTPATIENT_CLINIC_OR_DEPARTMENT_OTHER)
Admission: EM | Admit: 2023-03-31 | Discharge: 2023-03-31 | Disposition: A | Discharge status: Home / Self Care | Attending: Emergency Medicine | Admitting: Emergency Medicine

## 2023-03-31 ENCOUNTER — Other Ambulatory Visit: Payer: Self-pay

## 2023-03-31 ENCOUNTER — Encounter (HOSPITAL_BASED_OUTPATIENT_CLINIC_OR_DEPARTMENT_OTHER): Payer: Self-pay

## 2023-03-31 DIAGNOSIS — I129 Hypertensive chronic kidney disease with stage 1 through stage 4 chronic kidney disease, or unspecified chronic kidney disease: Secondary | ICD-10-CM | POA: Diagnosis not present

## 2023-03-31 DIAGNOSIS — Z8546 Personal history of malignant neoplasm of prostate: Secondary | ICD-10-CM | POA: Insufficient documentation

## 2023-03-31 DIAGNOSIS — Z79899 Other long term (current) drug therapy: Secondary | ICD-10-CM | POA: Insufficient documentation

## 2023-03-31 DIAGNOSIS — M79669 Pain in unspecified lower leg: Secondary | ICD-10-CM | POA: Diagnosis present

## 2023-03-31 DIAGNOSIS — R269 Unspecified abnormalities of gait and mobility: Secondary | ICD-10-CM | POA: Diagnosis not present

## 2023-03-31 DIAGNOSIS — Z7901 Long term (current) use of anticoagulants: Secondary | ICD-10-CM | POA: Insufficient documentation

## 2023-03-31 DIAGNOSIS — I4891 Unspecified atrial fibrillation: Secondary | ICD-10-CM | POA: Insufficient documentation

## 2023-03-31 DIAGNOSIS — I77810 Thoracic aortic ectasia: Secondary | ICD-10-CM | POA: Insufficient documentation

## 2023-03-31 DIAGNOSIS — M79652 Pain in left thigh: Secondary | ICD-10-CM | POA: Insufficient documentation

## 2023-03-31 DIAGNOSIS — N189 Chronic kidney disease, unspecified: Secondary | ICD-10-CM | POA: Insufficient documentation

## 2023-03-31 LAB — CBC WITH DIFFERENTIAL/PLATELET
Abs Immature Granulocytes: 0.01 10*3/uL (ref 0.00–0.07)
Basophils Absolute: 0 10*3/uL (ref 0.0–0.1)
Basophils Relative: 1 %
Eosinophils Absolute: 0.5 10*3/uL (ref 0.0–0.5)
Eosinophils Relative: 10 %
HCT: 34.2 % — ABNORMAL LOW (ref 39.0–52.0)
Hemoglobin: 12 g/dL — ABNORMAL LOW (ref 13.0–17.0)
Immature Granulocytes: 0 %
Lymphocytes Relative: 16 %
Lymphs Abs: 0.7 10*3/uL (ref 0.7–4.0)
MCH: 32.9 pg (ref 26.0–34.0)
MCHC: 35.1 g/dL (ref 30.0–36.0)
MCV: 93.7 fL (ref 80.0–100.0)
Monocytes Absolute: 0.5 10*3/uL (ref 0.1–1.0)
Monocytes Relative: 10 %
Neutro Abs: 3 10*3/uL (ref 1.7–7.7)
Neutrophils Relative %: 63 %
Platelets: 177 10*3/uL (ref 150–400)
RBC: 3.65 MIL/uL — ABNORMAL LOW (ref 4.22–5.81)
RDW: 13.2 % (ref 11.5–15.5)
WBC: 4.7 10*3/uL (ref 4.0–10.5)
nRBC: 0 % (ref 0.0–0.2)

## 2023-03-31 LAB — BASIC METABOLIC PANEL
Anion gap: 11 (ref 5–15)
BUN: 16 mg/dL (ref 8–23)
CO2: 23 mmol/L (ref 22–32)
Calcium: 10 mg/dL (ref 8.9–10.3)
Chloride: 103 mmol/L (ref 98–111)
Creatinine, Ser: 1.53 mg/dL — ABNORMAL HIGH (ref 0.61–1.24)
GFR, Estimated: 47 mL/min — ABNORMAL LOW (ref >60)
Glucose, Bld: 103 mg/dL — ABNORMAL HIGH (ref 70–99)
Potassium: 4.2 mmol/L (ref 3.5–5.1)
Sodium: 137 mmol/L (ref 135–145)

## 2023-03-31 LAB — CK: Total CK: 135 U/L (ref 49–397)

## 2023-03-31 LAB — MAGNESIUM: Magnesium: 1.8 mg/dL (ref 1.7–2.4)

## 2023-03-31 MED ORDER — ACETAMINOPHEN 500 MG PO TABS
1000.0000 mg | ORAL_TABLET | Freq: Once | ORAL | Status: AC
  Administered 2023-03-31: 1000 mg via ORAL
  Filled 2023-03-31: qty 2

## 2023-03-31 MED ORDER — LIDOCAINE 5 % EX PTCH
1.0000 | MEDICATED_PATCH | CUTANEOUS | 0 refills | Status: DC
  Filled 2023-03-31: qty 30, 30d supply, fill #0

## 2023-03-31 MED ORDER — LIDOCAINE 5 % EX PTCH
1.0000 | MEDICATED_PATCH | CUTANEOUS | Status: DC
  Administered 2023-03-31: 1 via TRANSDERMAL
  Filled 2023-03-31: qty 1

## 2023-03-31 MED ORDER — TIZANIDINE HCL 2 MG PO TABS
2.0000 mg | ORAL_TABLET | Freq: Once | ORAL | Status: AC
  Administered 2023-03-31: 2 mg via ORAL
  Filled 2023-03-31: qty 1

## 2023-03-31 NOTE — Progress Notes (Unsigned)
 Cardiology Office Note:   Date:  04/02/2023  ID:  David [name], DOB [birth year], MRN [id number] PCP: [name], MD   [facility] Providers Cardiologist:  [name], MD {  History of Present Illness:   David [name] is a 78 y.o. male who presents for follow up of atrial fib.  He has been seen in the Atrial Fib clinic.  He has had PAF.  He did have a CT demonstrating a stable aortic dilatation of 43 mm.  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     Since I last saw him he has had no new cardiovascular complaints.  He was in the emergency room the other day with some leg pain.  I saw that he was put on lidocaine patch.  He has been on tizanidine which was stopped.  He is apparently going to have follow-up of this.  There was not thought to be an acute process and he is on blood thinners so they did not think was a DVT.  In questioning him he had been doing some calisthenics which could have been related.  He has not felt any palpitations.  He has had no presyncope or syncope.  He gets around with a cane because of some unsteadiness.  He has lost about 30 pounds as he is trying not to eat as much.   ROS: As stated in the HPI and negative for all other systems.  Studies Reviewed:    EKG:   EKG Interpretation Date/Time:  Friday April 02 2023 09:30:50 EDT Ventricular Rate:  60 PR Interval:  200 QRS Duration:  94 QT Interval:  456 QTC Calculation: 456 R Axis:   -36  Text Interpretation: Normal sinus rhythm Left axis deviation Poor anterior R wave progression When compared with ECG of 29-Nov-2022 12:21, No significant change since last tracing Confirmed by [name] ([id number]) on 04/02/2023 9:40:54 AM    Risk Assessment/Calculations:    CHA2DS2-VASc Score = 4   This indicates a 4.8% annual risk of stroke. The patient's score is based upon: CHF History: 0 HTN History: 1 Diabetes  History: 0 Stroke History: 0 Vascular Disease History: 1 Age Score: 2 Gender Score: 0        Physical Exam:   VS:  BP (!) 158/90 (BP Location: Left Arm, Patient Position: Sitting, Cuff Size: Normal)   Pulse 60   Ht 5\' 11"  (1.803 m)   Wt 229 lb 3.2 oz (104 kg)   SpO2 98%   BMI 31.97 kg/m    Wt Readings from Last 3 Encounters:  04/02/23 229 lb 3.2 oz (104 kg)  04/01/23 228 lb 6.4 oz (103.6 kg)  03/31/23 225 lb (102.1 kg)     GEN: Well nourished, well developed in no acute distress NECK: No JVD; No carotid bruits CARDIAC: RRR, no murmurs, rubs, gallops RESPIRATORY:  Clear to auscultation without rales, wheezing or rhonchi  ABDOMEN: Soft, non-tender, non-distended EXTREMITIES:  No edema; No deformity   ASSESSMENT AND PLAN:   ATRIAL FIB:   The patient has not noticed any atrial fibrillation.  He tolerates anticoagulation.  No change in therapy.  He is up-to-date with blood work.  HTN: Blood pressure is not at target.  Emina add amlodipine 2.5 mg daily.  He can send me follow up BPs .     CKD IIIA: His creatinine is lower than previous at 1.56.  No change in  therapy.  He only has 1 kidney   AORTIC ROOT ENLARGEMENT:   This was 45 mm in Dec 2024.  I will repeat this CT in Dec 2025.    MUSCLE ACHES: I am going to have him hold his Lipitor for couple of months and see if his aches improved.  If not he will restart it.  If it does he will restart it and see if they come back.  Question    Follow up with me in one year.   Signed, [name], MD

## 2023-03-31 NOTE — ED Notes (Addendum)
 RT Note: This time patient was walked with the use of a cane. He states he has a cane at home. The patient tolerated walk very well. More of a stable gait with the use of the walker
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
## 2023-03-31 NOTE — Discharge Instructions (Addendum)
 Your labs were overall reassuring.  Recommend you discontinue Naproxen and Tizanidine and can continue to take Tylenol for pain control. No indication for further imaging evaluation at this time. Can also try lidocaine patch for pain control. Please follow-up with your PCP.

## 2023-03-31 NOTE — Progress Notes (Unsigned)
 Established patient visit   Patient: David [name]   DOB: 1945-10-09   78 y.o. Male  MRN: [id number] Visit Date: 04/01/2023  Today's healthcare provider: Alfredia Ferguson, PA-C   No chief complaint on file.  Subjective     Pt was seen in ED 03/29/23 and again 03/31/23. First with concerns of left hip pain and hip weakness. Imaging with Mild degenerative changes of both hips. No acute findings.   Pt d/c with naproxen.   Pt again presented to ED 03/31/23 with the same pain.  He has 2+ DP pulses bilaterally.  5 out of 5 strength noted in the bilateral lower extremities with no sensory deficit.  Low concern for DVT as the patient is on Eliquis and has no asymmetric swelling of the leg, pulses are symmetric and intact.  Low concern for acute traumatic injury given lack of recent trauma and previous negative x-ray imaging of the hips.  X-ray imaging had previously shown osteoarthritis.   Pt considered to have muscle strain, advised tylenol, lidocaine patch. Advised to stop naproxen 2/2 ckd. Stable labs.   Medications: Outpatient Medications Prior to Visit  Medication Sig   acetaminophen (TYLENOL) 325 MG tablet Take 2 tablets (650 mg total) by mouth every 6 (six) hours as needed.   allopurinol (ZYLOPRIM) 100 MG tablet TAKE 1/2 TABLET BY MOUTH DAILY   ascorbic acid (VITAMIN C) 1000 MG tablet Take by mouth.   atenolol (TENORMIN) 50 MG tablet Take 1 tablet (50 mg total) by mouth 2 (two) times daily.   atorvastatin (LIPITOR) 10 MG tablet Take 1 tablet (10 mg total) by mouth every morning.   Cholecalciferol (VITAMIN D-1000 MAX ST) 25 MCG (1000 UT) tablet Take by mouth.   clotrimazole-betamethasone (LOTRISONE) cream Apply 1 Application topically 2 (two) times daily.   colchicine 0.6 MG tablet For gout flares, take 1 tab po once then repeat in 1 hour as needed if pain persists up to a total of 3 doses   ELIQUIS 5 MG TABS tablet TAKE 1 TABLET BY MOUTH 2 TIMES A DAY   FLUoxetine (PROZAC) 20  MG tablet Take 1 tablet (20 mg total) by mouth 2 (two) times daily.   fluticasone (FLONASE) 50 MCG/ACT nasal spray Place 2 sprays into both nostrils daily. (Patient taking differently: Place 2 sprays into both nostrils as needed.)   ketoconazole (NIZORAL) 2 % cream APPLY A THIN LAYER TOPICALLY DAILY   lidocaine (LIDODERM) 5 % Place 1 patch onto the skin daily. Remove & Discard patch within 12 hours or as directed by MD   LORazepam (ATIVAN) 1 MG tablet TAKE ONE TABLET BY MOUTH EVERY 8 HOURS AS NEEDED FOR ANXIETY   omeprazole (PRILOSEC) 20 MG capsule Take 20 mg by mouth daily as needed (acid reflux).    tamsulosin (FLOMAX) 0.4 MG CAPS capsule Take 1 capsule (0.4 mg total) by mouth daily after supper.   triamcinolone (KENALOG) 0.025 % cream  (Patient not taking: Reported on 03/25/2023)   Facility-Administered Medications Prior to Visit  Medication Dose Route Frequency Provider   lidocaine (LIDODERM) 5 % 1 patch  1 patch Transdermal Q24H Ernie Avena, MD    Review of Systems {Insert previous labs (optional):23779} {See past labs  Heme  Chem  Endocrine  Serology  Results Review (optional):1}   Objective    There were no vitals taken for this visit. {Insert last BP/Wt (optional):23777}{See vitals history (optional):1}  Physical Exam  ***  No results found for any visits on  04/01/23.  Assessment & Plan    There are no diagnoses linked to this encounter.  ***  No follow-ups on file.       Alfredia Ferguson, PA-C  [facility] at [facility] [phone] (phone) [phone] (fax)  [facility]

## 2023-03-31 NOTE — ED Notes (Signed)
 RT Note: Patient walked down the hall a few steps. Patient stated he felt a little dizzy but was able to stand with no assistance before going back to bad. Patient wwalked back to the bed with little assistance.

## 2023-04-01 ENCOUNTER — Ambulatory Visit (INDEPENDENT_AMBULATORY_CARE_PROVIDER_SITE_OTHER): Admitting: Physician Assistant

## 2023-04-01 ENCOUNTER — Encounter: Payer: Self-pay | Admitting: Physician Assistant

## 2023-04-01 ENCOUNTER — Other Ambulatory Visit (HOSPITAL_BASED_OUTPATIENT_CLINIC_OR_DEPARTMENT_OTHER): Payer: Self-pay

## 2023-04-01 VITALS — BP 151/77 | HR 66 | Ht 71.0 in | Wt 228.4 lb

## 2023-04-01 DIAGNOSIS — M25552 Pain in left hip: Secondary | ICD-10-CM

## 2023-04-01 DIAGNOSIS — M79652 Pain in left thigh: Secondary | ICD-10-CM | POA: Diagnosis not present

## 2023-04-01 MED ORDER — GABAPENTIN 300 MG PO CAPS
300.0000 mg | ORAL_CAPSULE | Freq: Two times a day (BID) | ORAL | 3 refills | Status: DC | PRN
  Filled 2023-04-01: qty 60, 30d supply, fill #0

## 2023-04-02 ENCOUNTER — Other Ambulatory Visit (HOSPITAL_COMMUNITY): Payer: Self-pay

## 2023-04-02 ENCOUNTER — Encounter: Payer: Self-pay | Admitting: Cardiology

## 2023-04-02 ENCOUNTER — Ambulatory Visit: Attending: Cardiology | Admitting: Cardiology

## 2023-04-02 VITALS — BP 158/90 | HR 60 | Ht 71.0 in | Wt 229.2 lb

## 2023-04-02 DIAGNOSIS — I480 Paroxysmal atrial fibrillation: Secondary | ICD-10-CM | POA: Diagnosis not present

## 2023-04-02 DIAGNOSIS — I77810 Thoracic aortic ectasia: Secondary | ICD-10-CM

## 2023-04-02 DIAGNOSIS — N1831 Chronic kidney disease, stage 3a: Secondary | ICD-10-CM

## 2023-04-02 DIAGNOSIS — I10 Essential (primary) hypertension: Secondary | ICD-10-CM

## 2023-04-02 MED ORDER — APIXABAN 5 MG PO TABS: 5.0000 mg | ORAL_TABLET | Freq: Two times a day (BID) | ORAL | 0 refills | Status: DC

## 2023-04-02 MED ORDER — APIXABAN 5 MG PO TABS
5.0000 mg | ORAL_TABLET | Freq: Two times a day (BID) | ORAL | 3 refills | Status: DC
  Filled 2023-04-02: qty 180, 90d supply, fill #0

## 2023-04-02 MED ORDER — AMLODIPINE BESYLATE 2.5 MG PO TABS
2.5000 mg | ORAL_TABLET | Freq: Every day | ORAL | 3 refills | Status: DC
  Filled 2023-04-02: qty 90, 90d supply, fill #0

## 2023-04-02 NOTE — Patient Instructions (Signed)
 Medication Instructions:  No changes. New script for eliquis sent to [facility] for mail order. Samples given in office. Start Amlodipine 2.5 mg daily. May take statin holiday as discussed with MD during office visit.   *If you need a refill on your cardiac medications before your next appointment, please call your pharmacy*   Follow-Up: At [facility], you and your health needs are our priority.  As part of our continuing mission to provide you with exceptional heart care, we have created designated Provider Care Teams.  These Care Teams include your primary Cardiologist (physician) and Advanced Practice Providers (APPs -  Physician Assistants and Nurse Practitioners) who all work together to provide you with the care you need, when you need it.  We recommend signing up for the patient portal called "MyChart".  Sign up information is provided on this After Visit Summary.  MyChart is used to connect with patients for Virtual Visits (Telemedicine).  Patients are able to view lab/test results, encounter notes, upcoming appointments, etc.  Non-urgent messages can be sent to your provider as well.   To learn more about what you can do with MyChart, go to [email or web address].    Your next appointment:   1 year(s)  Provider:   [name], MD     Other Instructions

## 2023-04-05 ENCOUNTER — Other Ambulatory Visit: Payer: Self-pay | Admitting: Family Medicine

## 2023-04-05 ENCOUNTER — Ambulatory Visit: Payer: Self-pay | Admitting: Licensed Clinical Social Worker

## 2023-04-05 NOTE — Patient Outreach (Signed)
 Care Coordination   Follow Up Visit Note   04/05/2023 Name: David [name] MRN: [id number] DOB: 03-31-1945  David [name] is a 78 y.o. year old male who sees [name], MD for primary care. I spoke with  [name] / Sharon Mt, spouse of client, via  phone today.  What matters to the patients health and wellness today?  Patient has challenges in managing anxiety or depression issues    Goals Addressed             This Visit's Progress    Patient has challenges in managing anxiety or depression issues       Interventions: LCSW spoke via phone today with David [name], spouse of client, about client needs Client has support from his spouse and from his children David Lazare said client is having walking issues. Client is using a cane to help him walk David said client has appointment tomorrow with orthopedist.   Discussed medication procurement of client. Discussed sleeping issues of client. Discussed appetite of client Discussed client support with PCP, Dr. Danise Edge. Client has appointment with PCP in April of 2025 Reviewed pain issues of client Discussed program support with RN, LCSW, Pharmacist Client is also receiving support with urologist. Lewis Moccasin for phone call with LCSW today Encouraged client or David [name] to call LCSW as needed for SW support for client at [phone] David was appreciative of call from LCSW today to discuss client needs         SDOH assessments and interventions completed:  Yes  SDOH Interventions Today    Flowsheet Row Most Recent Value  SDOH Interventions   Depression Interventions/Treatment  Counseling  Physical Activity Interventions Other (Comments)  [difficulty in walking. uses a cane to help him walk]  Stress Interventions Other (Comment)  [has some stress in managing medical needs]        Care Coordination Interventions:  Yes, provided   Interventions Today    Flowsheet Row Most Recent Value   Chronic Disease   Chronic disease during today's visit Other  [spoke with David [name], spouse of client, about client needs]  General Interventions   General Interventions Discussed/Reviewed General Interventions Discussed, Community Resources  Education Interventions   Education Provided Provided Education  Provided Verbal Education On [employer]  Mental Health Interventions   Mental Health Discussed/Reviewed Coping Strategies  [client has some pain issues,  has walking issues. Trying to use coping skills to manage medical needs]  Nutrition Interventions   Nutrition Discussed/Reviewed Nutrition Discussed  Pharmacy Interventions   Pharmacy Dicussed/Reviewed Pharmacy Topics Discussed  Safety Interventions   Safety Discussed/Reviewed Fall Risk        Follow up plan: Follow up call scheduled for 05/17/23 at 10:30 AM    Encounter Outcome:  Patient Visit Completed    Lorna Few  MSW, LCSW [place] Licensed Clinical Social Worker Direct Dial:  [phone] Fax:  [phone] Website:  [email or web address]

## 2023-04-05 NOTE — Patient Instructions (Signed)
 Visit Information  Thank you for taking time to visit with me today. Please don't hesitate to contact me if I can be of assistance to you.   Following are the goals we discussed today:   Goals Addressed             This Visit's Progress    Patient has challenges in managing anxiety or depression issues       Interventions: LCSW spoke via phone today with Ginger Sharl Ma, spouse of client, about client needs Client has support from his spouse and from his children Ginger Holcomb said client is having walking issues. Client is using a cane to help him walk Ginger said client has appointment tomorrow with orthopedist.   Discussed medication procurement of client. Discussed sleeping issues of client. Discussed appetite of client Discussed client support with PCP, Dr. Danise Edge. Client has appointment with PCP in April of 2025 Reviewed pain issues of client Discussed program support with RN, LCSW, Pharmacist Client is also receiving support with urologist. Lewis Moccasin for phone call with LCSW today Encouraged client or Ginger Sharl Ma to call LCSW as needed for SW support for client at [phone] Ginger was appreciative of call from LCSW today to discuss client needs         Our next appointment is by telephone on 05/17/23 at 10:30 AM   Please call the care guide team at [phone] if you need to cancel or reschedule your appointment.   If you are experiencing a Mental Health or Behavioral Health Crisis or need someone to talk to, please go to [facility] [address] [place] [phone])   The patient/ Ginger Vangilder, spouse of client, verbalized understanding of instructions, educational materials, and care plan provided today and DECLINED offer to receive copy of patient instructions, educational materials, and care plan.   The patient/ Ginger Seider, spouse of client,  has been provided with contact information for the care management  team and has been advised to call with any health related questions or concerns.    Lorna Few  MSW, LCSW [place] Licensed Clinical Social Worker Direct Dial:  [phone] Fax:  [phone] Website:  [email or web address]

## 2023-04-06 ENCOUNTER — Encounter: Payer: Self-pay | Admitting: Sports Medicine

## 2023-04-06 ENCOUNTER — Other Ambulatory Visit (HOSPITAL_BASED_OUTPATIENT_CLINIC_OR_DEPARTMENT_OTHER): Payer: Self-pay

## 2023-04-06 ENCOUNTER — Ambulatory Visit: Admitting: Sports Medicine

## 2023-04-06 ENCOUNTER — Ambulatory Visit (HOSPITAL_BASED_OUTPATIENT_CLINIC_OR_DEPARTMENT_OTHER)
Admission: RE | Admit: 2023-04-06 | Discharge: 2023-04-06 | Disposition: A | Discharge status: Home / Self Care | Source: Ambulatory Visit | Attending: Sports Medicine | Admitting: Sports Medicine

## 2023-04-06 VITALS — BP 124/70 | Ht 71.0 in | Wt 229.0 lb

## 2023-04-06 DIAGNOSIS — M47816 Spondylosis without myelopathy or radiculopathy, lumbar region: Secondary | ICD-10-CM | POA: Diagnosis not present

## 2023-04-06 DIAGNOSIS — M419 Scoliosis, unspecified: Secondary | ICD-10-CM | POA: Diagnosis not present

## 2023-04-06 DIAGNOSIS — M8580 Other specified disorders of bone density and structure, unspecified site: Secondary | ICD-10-CM | POA: Diagnosis not present

## 2023-04-06 DIAGNOSIS — M5416 Radiculopathy, lumbar region: Secondary | ICD-10-CM

## 2023-04-06 DIAGNOSIS — Z85828 Personal history of other malignant neoplasm of skin: Secondary | ICD-10-CM | POA: Diagnosis not present

## 2023-04-06 DIAGNOSIS — L821 Other seborrheic keratosis: Secondary | ICD-10-CM | POA: Diagnosis not present

## 2023-04-06 DIAGNOSIS — M48061 Spinal stenosis, lumbar region without neurogenic claudication: Secondary | ICD-10-CM | POA: Diagnosis not present

## 2023-04-06 DIAGNOSIS — C4371 Malignant melanoma of right lower limb, including hip: Secondary | ICD-10-CM | POA: Diagnosis not present

## 2023-04-06 DIAGNOSIS — D225 Melanocytic nevi of trunk: Secondary | ICD-10-CM | POA: Diagnosis not present

## 2023-04-06 DIAGNOSIS — M549 Dorsalgia, unspecified: Secondary | ICD-10-CM | POA: Diagnosis not present

## 2023-04-06 DIAGNOSIS — M4317 Spondylolisthesis, lumbosacral region: Secondary | ICD-10-CM | POA: Diagnosis not present

## 2023-04-06 DIAGNOSIS — D485 Neoplasm of uncertain behavior of skin: Secondary | ICD-10-CM | POA: Diagnosis not present

## 2023-04-06 MED ORDER — HYDROCODONE-ACETAMINOPHEN 5-325 MG PO TABS
1.0000 | ORAL_TABLET | Freq: Three times a day (TID) | ORAL | 0 refills | Status: DC | PRN
  Filled 2023-04-06: qty 15, 5d supply, fill #0

## 2023-04-06 NOTE — Progress Notes (Addendum)
   Subjective:    Patient ID: David [name], male    DOB: [birth year], 78 y.o.   MRN: [id number]  HPI chief complaint: Low back and left hip pain  Patient is a very pleasant 78 year old male that presents today with several weeks of left-sided low back and hip pain.  He localizes the pain to the left posterior hip with radiating pain down the left leg.  Pain has been severe enough at times that he has had to seek treatment in the emergency room.  He has tried naproxen but had to discontinue it due to his multiple medical conditions.  He has also tried topical lidocaine and gabapentin but they have not been helpful.  He did try one of his wife's hydrocodone's and that was temporarily helpful.  He denies numbness or tingling in the leg.  He does endorse some weakness.  He has a history of prostate cancer.  X-rays of his left hip done in the emergency department showed only minimal arthritis.  He denies any groin pain.  Past medical history reviewed Medications reviewed Allergies reviewed   Review of Systems As above    Objective:   Physical Exam  Well-developed, well-nourished.  No acute distress  Lumbar spine: No tenderness to palpation or percussion along the lumbar midline.  Limited range of motion.  Left hip: Smooth painless hip range of motion with a negative logroll  Neurological exam: There is weakness with resisted dorsiflexion on the left compared to the right.  Otherwise strength is 5/5.  Reflexes are trace but equal at the Achilles and patellar reflexes      Assessment & Plan:   Left leg radiculopathy likely secondary to spinal stenosis Prostate cancer  We will start with getting x-rays of his thoracic and lumbar spine.  We will follow this with an MRI of his lumbar spine to rule out foraminal stenosis but also to ensure that there is no metastatic prostate cancer.  He and his wife will follow-up with me in the office after that MRI.  In the meantime, I will prescribe him  some hydrocodone to take for severe pain we will start physical therapy.  Addendum: Thoracic spine x-ray shows upper thoracic levoscoliosis.  Otherwise unremarkable.  Lumbar spine x-rays show multilevel degenerative changes and a grade 1 L5-S1 anterior listhesis.  This note was dictated using Dragon naturally speaking software and may contain errors in syntax, spelling, or content which have not been identified prior to signing this note.

## 2023-04-08 ENCOUNTER — Telehealth (HOSPITAL_BASED_OUTPATIENT_CLINIC_OR_DEPARTMENT_OTHER): Payer: Self-pay

## 2023-04-09 ENCOUNTER — Ambulatory Visit (HOSPITAL_COMMUNITY)

## 2023-04-13 ENCOUNTER — Other Ambulatory Visit: Payer: Self-pay | Admitting: Family Medicine

## 2023-04-13 MED ORDER — LORAZEPAM 1 MG PO TABS: 1.0000 mg | ORAL_TABLET | Freq: Three times a day (TID) | ORAL | 0 refills | Status: DC | PRN

## 2023-04-13 NOTE — Telephone Encounter (Signed)
 Copied from CRM [id number]. Topic: Clinical - Medication Refill >> Apr 13, 2023  3:15 PM Eunice Blase wrote: Most Recent Primary Care Visit:  Provider: Alfredia Ferguson  Department: [facility]  Visit Type: OFFICE VISIT  Date: 04/01/2023  Medication: LORazepam (ATIVAN) 1 MG tablet  Has the patient contacted their pharmacy? Yes (Agent: If no, request that the patient contact the pharmacy for the refill. If patient does not wish to contact the pharmacy document the reason why and proceed with request.) (Agent: If yes, when and what did the pharmacy advise?)Pharmacy need PCP approval.   Is this the correct pharmacy for this prescription? Yes If no, delete pharmacy and type the correct one.  This is the patient's preferred pharmacy:  [facility] [id number] [facility], [place] - [address] [place] [address] Phone: [phone] Fax: [phone]  Has the prescription been filled recently? Yes  Is the patient out of the medication? Yes  Has the patient been seen for an appointment in the last year OR does the patient have an upcoming appointment? Yes  Can we respond through MyChart? Yes  Agent: Please be advised that Rx refills may take up to 3 business days. We ask that you follow-up with your pharmacy.

## 2023-04-13 NOTE — Telephone Encounter (Signed)
 Requesting: lorazepam 1mg   Contract: 12/08/22 UDS:12/08/22 Last Visit: 04/01/23 w/ Lillia Abed Next Visit: 06/24/23  Last Refill: 02/22/23 #90 and 0RF   Please Advise

## 2023-04-15 ENCOUNTER — Telehealth: Payer: Self-pay | Admitting: Neurology

## 2023-04-15 NOTE — Telephone Encounter (Signed)
 Patient's wife David [name]. Patient getting up in the morning not knowing where he is, thinks people are house; brother and his children are here and they will be back, thinks we have a basement and we do not. We need earlier appt than June. Would like a call from the nurse.

## 2023-04-15 NOTE — Telephone Encounter (Signed)
 Returned call to wife and she reports for the last weeks patient had sudden change in behavior and being confused. She reports history with prostate cancer but can't confirm or deny any UTI symptoms. Advised to call PCP to rule out UTI or other infections. Patient has also has several recent ER visits for pain in hips. Wife in agreement to call PCP

## 2023-04-16 ENCOUNTER — Ambulatory Visit (INDEPENDENT_AMBULATORY_CARE_PROVIDER_SITE_OTHER): Admitting: Family Medicine

## 2023-04-16 ENCOUNTER — Encounter: Payer: Self-pay | Admitting: Family Medicine

## 2023-04-16 VITALS — BP 128/67 | HR 57 | Temp 97.7°F | Ht 71.0 in | Wt 225.0 lb

## 2023-04-16 DIAGNOSIS — R410 Disorientation, unspecified: Secondary | ICD-10-CM | POA: Diagnosis not present

## 2023-04-16 LAB — COMPREHENSIVE METABOLIC PANEL WITH GFR
ALT: 19 U/L (ref 0–53)
AST: 22 U/L (ref 0–37)
Albumin: 4.1 g/dL (ref 3.5–5.2)
Alkaline Phosphatase: 66 U/L (ref 39–117)
BUN: 19 mg/dL (ref 6–23)
CO2: 28 meq/L (ref 19–32)
Calcium: 10.1 mg/dL (ref 8.4–10.5)
Chloride: 102 meq/L (ref 96–112)
Creatinine, Ser: 1.6 mg/dL — ABNORMAL HIGH (ref 0.40–1.50)
GFR: 41.18 mL/min — ABNORMAL LOW (ref >60.00)
Glucose, Bld: 106 mg/dL — ABNORMAL HIGH (ref 70–99)
Potassium: 3.9 meq/L (ref 3.5–5.1)
Sodium: 138 meq/L (ref 135–145)
Total Bilirubin: 0.9 mg/dL (ref 0.2–1.2)
Total Protein: 6.2 g/dL (ref 6.0–8.3)

## 2023-04-16 LAB — CBC WITH DIFFERENTIAL/PLATELET
Basophils Absolute: 0 10*3/uL (ref 0.0–0.1)
Basophils Relative: 0.8 % (ref 0.0–3.0)
Eosinophils Absolute: 0.4 10*3/uL (ref 0.0–0.7)
Eosinophils Relative: 8.4 % — ABNORMAL HIGH (ref 0.0–5.0)
HCT: 34.5 % — ABNORMAL LOW (ref 39.0–52.0)
Hemoglobin: 11.7 g/dL — ABNORMAL LOW (ref 13.0–17.0)
Lymphocytes Relative: 14.6 % (ref 12.0–46.0)
Lymphs Abs: 0.7 10*3/uL (ref 0.7–4.0)
MCHC: 33.8 g/dL (ref 30.0–36.0)
MCV: 95.5 fl (ref 78.0–100.0)
Monocytes Absolute: 0.4 10*3/uL (ref 0.1–1.0)
Monocytes Relative: 8.3 % (ref 3.0–12.0)
Neutro Abs: 3.3 10*3/uL (ref 1.4–7.7)
Neutrophils Relative %: 67.9 % (ref 43.0–77.0)
Platelets: 181 10*3/uL (ref 150.0–400.0)
RBC: 3.61 Mil/uL — ABNORMAL LOW (ref 4.22–5.81)
RDW: 14.4 % (ref 11.5–15.5)
WBC: 4.8 10*3/uL (ref 4.0–10.5)

## 2023-04-16 LAB — B12 AND FOLATE PANEL
Folate: 11.7 ng/mL (ref >5.9)
Vitamin B-12: 1163 pg/mL — ABNORMAL HIGH (ref 211–911)

## 2023-04-16 LAB — TSH: TSH: 2.28 u[IU]/mL (ref 0.35–5.50)

## 2023-04-16 NOTE — Progress Notes (Signed)
 Acute Office Visit  Subjective:     Patient ID: ROSSER COLLINGTON, male    DOB: April 16, 1945, 78 y.o.   MRN: [id number]  Chief Complaint  Patient presents with   Altered Mental Status    Patient is in today for confusion.   Discussed the use of AI scribe software for clinical note transcription with the patient, who gave verbal consent to proceed.  History of Present Illness The patient presents with confusion and forgetfulness. He is accompanied by his wife. He was referred by a neurologist for evaluation of possible UTI.  He has been experiencing significant confusion and forgetfulness for the past three weeks. He wakes up disoriented and often believes there are other people in the house or that there is a basement, neither of which is true. He frequently inquires about his younger brother, Jonny Ruiz, who visited two weeks ago but does not live with him. His short-term memory is notably poor.  He is being followed by neurology, but wife reports no evidence of Alzheimer's on testing completed.  He has a history of prostate cancer and has undergone radiation therapy, which has caused long-standing urinary symptoms, including burning during urination. Recently, he reported lower abdominal pain in the bladder area. There has been no noticeable change in urine odor, but the color fluctuates from clear to darker shades. No increase in urinary frequency or urgency beyond what has been typical for him over the past six months. He also denies seeing blood in his urine.  No fever, nausea, vomiting, diarrhea, or changes in urinary frequency or urgency. He also denies any pain or pressure in the lower abdomen.  He answers questions appropriately, but admits he feels a little more confused these past few weeks.        ROS All review of systems negative except what is listed in the HPI      Objective:    BP 128/67   Pulse (!) 57   Temp 97.7 F (36.5 C) (Oral)   Ht 5\' 11"  (1.803 m)   Wt  225 lb (102.1 kg)   SpO2 97%   BMI 31.38 kg/m    Physical Exam Vitals reviewed.  Constitutional:      Appearance: Normal appearance.  Cardiovascular:     Rate and Rhythm: Normal rate and regular rhythm.  Pulmonary:     Effort: Pulmonary effort is normal.     Breath sounds: Normal breath sounds.  Abdominal:     General: There is no distension.     Tenderness: There is no abdominal tenderness. There is no right CVA tenderness, left CVA tenderness, guarding or rebound.  Skin:    General: Skin is warm and dry.  Neurological:     Mental Status: He is alert and oriented to person, place, and time.  Psychiatric:        Mood and Affect: Mood normal.        Behavior: Behavior normal.        Thought Content: Thought content normal.        Judgment: Judgment normal.        No results found for any visits on 04/16/23.      Assessment & Plan:   Problem List Items Addressed This Visit   None Visit Diagnoses       Confusion    -  Primary   Relevant Orders   POCT Urinalysis Dipstick (Automated)   Urine Culture   CBC with Differential/Platelet   Comprehensive metabolic panel with  GFR   B12 and Folate Panel   TSH       Assessment & Plan Acute Confusion Worsening confusion and disorientation with hallucinations and impaired short-term memory. Possible UTI as contributing factor. - Obtain urine sample for urinalysis to evaluate for UTI. - unable to leave specimen during visit; they will bring it back. - Perform blood work to assess overall health status. - No alarm findings on exam today. Patient/wife aware of signs/symptoms requiring further/urgent evaluation.       No orders of the defined types were placed in this encounter.   Return if symptoms worsen or fail to improve.  Clayborne Dana, NP

## 2023-04-17 LAB — URINE CULTURE
MICRO NUMBER:: 16291139
Result:: NO GROWTH
SPECIMEN QUALITY:: ADEQUATE

## 2023-04-18 ENCOUNTER — Ambulatory Visit (HOSPITAL_BASED_OUTPATIENT_CLINIC_OR_DEPARTMENT_OTHER)
Admission: RE | Admit: 2023-04-18 | Discharge: 2023-04-18 | Disposition: A | Discharge status: Home / Self Care | Source: Ambulatory Visit | Attending: Sports Medicine | Admitting: Sports Medicine

## 2023-04-18 DIAGNOSIS — M5126 Other intervertebral disc displacement, lumbar region: Secondary | ICD-10-CM | POA: Diagnosis not present

## 2023-04-18 DIAGNOSIS — M48061 Spinal stenosis, lumbar region without neurogenic claudication: Secondary | ICD-10-CM | POA: Diagnosis not present

## 2023-04-18 DIAGNOSIS — M5416 Radiculopathy, lumbar region: Secondary | ICD-10-CM | POA: Insufficient documentation

## 2023-04-18 DIAGNOSIS — M5136 Other intervertebral disc degeneration, lumbar region with discogenic back pain only: Secondary | ICD-10-CM | POA: Diagnosis not present

## 2023-04-19 ENCOUNTER — Telehealth: Payer: Self-pay | Admitting: Neurology

## 2023-04-19 ENCOUNTER — Encounter: Payer: Self-pay | Admitting: Family Medicine

## 2023-04-19 NOTE — Telephone Encounter (Signed)
 Looks like Dr. Terrace Arabia wants this to wait until they see McCue on 04/21/23

## 2023-04-19 NOTE — Telephone Encounter (Signed)
 Is having falls a new symptom? If so, should be seen by Dr. Terrace Arabia. If just for decline in memory, can discuss at scheduled visit.

## 2023-04-19 NOTE — Telephone Encounter (Signed)
 Has follow up with Shanda Bumps on April 9th 2025

## 2023-04-19 NOTE — Telephone Encounter (Signed)
 Lvm 1st attempt by hf 04/19/23

## 2023-04-19 NOTE — Telephone Encounter (Signed)
 FYI-Pt's wife called stating that she was wanting the pt to be seen sooner than the scheduled appt due to her noticing that he is getting worse. Pt has tried to get out and in the car, has been having falls and not able to remember anything but the past.

## 2023-04-19 NOTE — Telephone Encounter (Signed)
 Returned call to patient/wife as this was the only number left. They state they did not call. Not sure who [name] is because there was not a number left for them and no one named that on his chart.   Copied from CRM [id number]. Topic: Clinical - Medical Advice >> Apr 19, 2023 12:54 PM Taleah C wrote: Reason for CRM: [name] called & asked to speak with Ladona Ridgel regarding the patient's last appt because he feels as if the lab results and the appt summary report does not make sense with the patient's health issue. He explained that he is a [employer], [employer]/film/video and would like a callback after 3:15 if possible. Please call back and advise.

## 2023-04-20 ENCOUNTER — Ambulatory Visit: Payer: Self-pay

## 2023-04-20 ENCOUNTER — Encounter: Payer: Self-pay | Admitting: Family Medicine

## 2023-04-20 ENCOUNTER — Ambulatory Visit (INDEPENDENT_AMBULATORY_CARE_PROVIDER_SITE_OTHER): Admitting: Family Medicine

## 2023-04-20 VITALS — BP 128/62 | HR 56 | Temp 98.2°F | Resp 18 | Ht 71.0 in | Wt 223.0 lb

## 2023-04-20 DIAGNOSIS — M545 Low back pain, unspecified: Secondary | ICD-10-CM | POA: Diagnosis not present

## 2023-04-20 MED ORDER — METHYLPREDNISOLONE ACETATE 80 MG/ML IJ SUSP
80.0000 mg | Freq: Once | INTRAMUSCULAR | Status: AC
  Administered 2023-04-20: 80 mg via INTRAMUSCULAR

## 2023-04-20 MED ORDER — PREDNISONE 10 MG PO TABS: ORAL_TABLET | ORAL | 0 refills | Status: DC

## 2023-04-20 NOTE — Telephone Encounter (Signed)
 With patient having significant worsening since prior visit, he needs to be worked in with Dr. Terrace Arabia for further evaluation.

## 2023-04-20 NOTE — Telephone Encounter (Signed)
 Chief Complaint: medication reaction Symptoms: confusion, dizziness, hallucinations Frequency: for 2 wks since starting gabapentin Pertinent Negatives: Patient denies SI, HI, safety concern Disposition: [] ED /[] Urgent Care (no appt availability in office) / [x] Appointment(In office/virtual)/ []  [facility]/ [] Home Care/ [] Refused Recommended Disposition /[] [facility]/ []  Follow-up with PCP Additional Notes: RN spoke with pt's wife. Pt's son called about the pt, concerned about a medication reaction to gabapentin. Per wife, pt was prescribed gabapentin 2 wks ago for a "back problem." Wife states that since then, pt has had confusion with hallucinations and dizziness. Wife reports that the pt sees people that are not there. Wife reports pt has "fallen a couple of times due to missteps" and that he "lost balance in the kitchen but it wasn't a hard fall." Wife denies that pt hit his head. Denies N/V, neck pain, blurry vision. Wife states pt's confusion and dizziness comes and goes. Wife denies that pt has reported SI, HI, or is exhibiting behaviors that make her feel this is a safety concern. Wife states she wants the patient off gabapentin but only wants to do that supervised by a doctor. Pt does have an appt tomorrow with a neurosurgeon.  RN advised wife pt should be seen today for his symptoms. Wife agreeable. Wife states she has PT this afternoon and would need to bring the pt to an appt. RN scheduled pt for 1820 today at the office. RN advised wife if the pt gets worse he needs to go to the ED. Wife verbalized understanding.    Copied From CRM [id number]. Reason for Triage: Son called stating that dad(patient )is having a reaction to medication gabapentin.He is not with his dad right now,but would like for someone to call step mom who is with dad now at number [phone]  Reason for Disposition  [1] Longstanding confusion (e.g., dementia, stroke) AND [2] worsening  Answer  Assessment - Initial Assessment Questions 1. LEVEL OF CONSCIOUSNESS: "How is he (she, the patient) acting right now?" (e.g., alert-oriented, confused, lethargic, stuporous, comatose)     Sleeping right now, per wife 2. ONSET: "When did the confusion start?"  (minutes, hours, days)     When the pt started taking gabapentin 2 wks ago 3. PATTERN "Does this come and go, or has it been constant since it started?"  "Is it present now?"     Come and goes 4. ALCOHOL or DRUGS: "Has he been drinking alcohol or taking any drugs?"      No  5. NARCOTIC MEDICINES: "Has he been receiving any narcotic medications?" (e.g., morphine, Vicodin)     "Not really right now." Wife states pt may have sciatica and was prescribed hydrocodone, has only had it 3x in the last 3 wks. 6. CAUSE: "What do you think is causing the confusion?"      Gabapentin 7. OTHER SYMPTOMS: "Are there any other symptoms?" (e.g., difficulty breathing, headache, fever, weakness)     Son [name] called on behalf of this father. [name] asked we call pt's wife, his stepmom about the pt. RN spoke with pt's wife. Pt currently sleeping. Per wife, pt is taking gabapentin for a back problem. Having insomnia, confusion, dizziness, "seeing people." Wife states pt is hallucinating things that aren't there, said he saw two little kids in the corner. States he sees his son in the house when he isn't there. AMS comes and goes.   Dizziness - spouse states he has fallen "a couple times" due to "missteps", lost balance in  the kitchen and fell "but it wasn't a hard fall." Walking with a bad limp. Did not hit his head. Denies headache, N/V, blurry vision, neck pain. Spouse denies that pt has SI or HI or is unsafe.  Protocols used: Confusion - Delirium-A-AH

## 2023-04-20 NOTE — Patient Instructions (Signed)

## 2023-04-20 NOTE — Progress Notes (Signed)
 Established Patient Office Visit  Subjective   Patient ID: David [name], male    DOB: 1945/11/12  Age: 78 y.o. MRN: [id number]  Chief Complaint  Patient presents with   Dizziness    Lower extremity onset: 1 month    HPI Discussed the use of AI scribe software for clinical note transcription with the patient, who gave verbal consent to proceed.  History of Present Illness David [name] is a 78 year old male with recent falls and balance issues who presents with increased frequency of falls and left leg weakness. He is accompanied by his caregiver, who provides additional information about his condition.  Over the past month, he has experienced an increased frequency of falls, totaling five or six incidents, which is significantly more than in the past. He attributes these falls to balance issues and weakness, particularly in his left leg. He has been using a wheelchair more frequently due to difficulty walking and requires assistance at home to prevent falls.  He has a history of back and leg issues and has been experiencing pain in his left leg, which has led him to visit the emergency room four times. An MRI of the lower lumbar region was performed recently, but results are pending. Pain radiates to the knees, but not to the feet. He uses a cane for support and has been prescribed hydrocodone for pain management, which he uses sparingly.  He was prescribed gabapentin, which has helped alleviate his back pain but has led to increased confusion and memory issues. He reports seeing people who are not there and has had difficulty sleeping, with only two or three good nights of sleep in the past two weeks. His caregiver notes that his short-term memory has worsened, although he can recall long-term memories well. He has a history of brain atrophy, which was evaluated in November, and Alzheimer's disease was ruled out.  He has a history of chronic kidney disease, with a creatinine level  of 1.6 and a GFR of 41. He is not diabetic and is concerned about the potential side effects of his medications on his kidney function.  He has a history of prostate cancer and receives androgen deprivation therapy biannually, with the last treatment in January.   Patient Active Problem List   Diagnosis Date Noted   Aortic root dilatation (HCC) 03/31/2023   Sacral pain 12/08/2022   Right shoulder pain 09/30/2022   Cognitive impairment 07/12/2022   Chronic right ear pain 03/11/2022   Chronic anticoagulation 02/11/2022   CRI (chronic renal insufficiency) 10/29/2021   Abdominal pain 10/29/2021   Sun-damaged skin 07/23/2021   H/O prostate cancer 07/23/2021   Left knee pain 07/23/2021   Coronary atherosclerosis 02/11/2021   Myalgia 11/04/2020   Neck pain 11/04/2020   Stage 3a chronic kidney disease (HCC) 07/02/2020   Urinary urgency 02/12/2020   Colon cancer screening 09/26/2019   Family history of breast cancer    Family history of colon cancer    Educated about COVID-19 virus infection 08/26/2019   Prostate cancer (HCC) 04/28/2019   Pruritus 02/03/2019   LVH (left ventricular hypertrophy) 10/30/2018   Tinea versicolor 09/28/2018   Thoracic back pain 09/26/2018   Hypercalcemia 08/08/2018   Nonfunctioning kidney 08/04/2018   Arm pain 07/18/2018   Right hip pain 06/22/2018   Ureteral obstruction 06/07/2018   Recurrent nephrolithiasis 04/23/2018   Hydronephrosis 03/01/2018   Floppy eyelid syndrome of both eyes 01/24/2018   Foot pain, bilateral 12/28/2017   Kidney stone  09/18/2017   Insomnia 09/17/2017   Abnormal TSH 04/15/2017   Senile nuclear sclerosis 01/18/2017   Cataract 09/29/2016   Muscle cramp 09/29/2016   Nocturia 09/29/2016   Hyperglycemia 03/29/2016   Low back pain with radiation 03/26/2016   Cataracts, bilateral 03/26/2016   Arthritis of both knees 03/26/2016   Hearing loss 03/26/2016   Complex renal cyst 06/16/2015   History of atrial fibrillation 06/16/2015    Snoring 06/07/2015   Somnolence 06/07/2015   Preventative health care 03/07/2015   Lipoma of axilla 06/04/2014   Tinea cruris 03/20/2014   Lichen simplex chronicus 03/20/2014   Medicare annual wellness visit, subsequent 03/12/2014   Depression with anxiety 09/25/2013   Constipation 09/25/2013   Gouty arthritis of toe of left foot 08/01/2013   Skin lesion 05/27/2013   Rash 05/26/2013   Abnormal liver function 03/18/2013   IBS (irritable bowel syndrome) 03/18/2013   Other malaise and fatigue 03/13/2013   Tinea corporis 03/13/2013   Gout 12/14/2012   Diarrhea 12/14/2012   Rectal irritation 09/18/2012   Diverticulosis    BCC (basal cell carcinoma of skin)    Scoliosis 01/19/2011   Hyperlipidemia, mixed 03/11/2010   Essential hypertension, benign 03/11/2010   Allergic rhinitis 03/11/2010   Arthralgia 03/11/2010   Obesity 11/25/2007   BENIGN NEOPLASM OF ADRENAL GLAND 09/22/2007   Fatty liver disease, nonalcoholic 09/22/2007   Osteoarthritis 09/22/2007   SPONDYLOSIS, LUMBAR 09/22/2007   GERD 04/12/2007   Past Medical History:  Diagnosis Date   Adenoma of right adrenal gland 07/11/2002   2.4cm , noted on CT ABD   Anxiety    Arthritis of both knees 03/26/2016   Astigmatism    Back pain    BCC (basal cell carcinoma of skin)    under right eye and right ear   Blood transfusion without reported diagnosis    Cataract 09/29/2016   Depression    Diverticulitis 2009   Diverticulosis    Family history of breast cancer    Family history of colon cancer    Fatty liver    GERD (gastroesophageal reflux disease)    Gout    Hearing loss of both ears 03/26/2016   History of atrial fibrillation    x 2 none since 2020   History of chronic prostatitis    started at age 5   History of kidney stones    Hx of adenomatous colonic polyps 02/16/2022   3 diminutive - no recall   Hyperglycemia    Hyperlipidemia    Hypertension    Incomplete right bundle branch block (RBBB)     Internal hemorrhoids    Kidney lesion 06/07/2015   Right midportion, 1.1x1.1 cm hyperechoic, noted on [place] ABD   LAFB (left anterior fascicular block)    Lipoma of axilla 09/2016   Liver lesion, right lobe 06/07/2015   2.5x2.4x2.4 cm hypoechoic lesion posterior aspect, noted on [place] ABD   Low back pain 03/26/2016   LVH (left ventricular hypertrophy) 08/06/2017   Moderate, Noted on ECHO   Medicare annual wellness visit, subsequent 03/12/2014   OA (osteoarthritis)    Back, Hands, Neck   Obesity 11/25/2007   Qualifier: Diagnosis of  By: Kriste Basque MD, Lonzo Cloud    Other malaise and fatigue 03/13/2013   Premature ventricular complex    Prostate cancer (HCC) dx 2021   Renal insufficiency    Kidney removed right   Scoliosis    Upper thoracic and lumbar   Sinus bradycardia 08/06/2017   Noted on EKG  Vitamin D deficiency    resolved   Past Surgical History:  Procedure Laterality Date   CARDIOVERSION  07/31/2017   CATARACT EXTRACTION, BILATERAL     COLON SURGERY  2009   segmental sigmoid resection   COLONOSCOPY     COLONOSCOPY WITH PROPOFOL     CYSTOSCOPY N/A 07/28/2019   Procedure: CYSTOSCOPY FLEXIBLE;  Surgeon: Malen Gauze, MD;  Location: [facility];  Service: Urology;  Laterality: N/A;   CYSTOSCOPY W/ RETROGRADES Right 06/07/2018   Procedure: CYSTOSCOPY WITH RETROGRADE PYELOGRAM, uphrostogram;  Surgeon: Malen Gauze, MD;  Location: [facility];  Service: Urology;  Laterality: Right;   CYSTOSCOPY WITH URETEROSCOPY AND STENT PLACEMENT Right 02/28/2018   Procedure: CYSTOSCOPY WITH URETEROSCOPY Iverson Alamin;  Surgeon: Ihor Gully, MD;  Location: [facility];  Service: Urology;  Laterality: Right;   CYSTOSCOPY/URETEROSCOPY/HOLMIUM LASER/STENT PLACEMENT Right 11/29/2017   Procedure: CYSTOSCOPY/RETROGRADE/URETEROSCOPY/HOLMIUM LASER/STENT PLACEMENT;  Surgeon: Ihor Gully, MD;  Location: [facility];  Service: Urology;  Laterality: Right;   EYE SURGERY  Bilateral 01/12/2017   cataract removal   history of blood tranfusion  age 69   HOLMIUM LASER APPLICATION Right 09/24/2017   Procedure: HOLMIUM LASER APPLICATION;  Surgeon: Ihor Gully, MD;  Location: [facility];  Service: Urology;  Laterality: Right;   IR BALLOON DILATION URETERAL STRICTURE RIGHT  03/14/2018   IR NEPHRO TUBE REMOV/FL  03/17/2018   IR NEPHROSTOGRAM RIGHT THRU EXISTING ACCESS  03/17/2018   IR NEPHROSTOMY EXCHANGE RIGHT  03/14/2018   IR NEPHROSTOMY EXCHANGE RIGHT  06/21/2018   IR NEPHROSTOMY PLACEMENT RIGHT  03/02/2018   IR NEPHROSTOMY PLACEMENT RIGHT  04/20/2018   IR URETERAL STENT PLACEMENT EXISTING ACCESS RIGHT  03/14/2018   NOSE SURGERY     Submucous resection age 73   NUCLEAR STRESS TEST  06/03/2009   RADIOACTIVE SEED IMPLANT N/A 07/28/2019   Procedure: RADIOACTIVE SEED IMPLANT/BRACHYTHERAPY IMPLANT;  Surgeon: Malen Gauze, MD;  Location: [facility];  Service: Urology;  Laterality: N/A;  90 MINS   ROBOT ASSISTED LAPAROSCOPIC NEPHRECTOMY Right 08/04/2018   Procedure: XI ROBOTIC ASSISTED LAPAROSCOPIC NEPHRECTOMY;  Surgeon: Malen Gauze, MD;  Location: [facility];  Service: Urology;  Laterality: Right;  3 HRS   ROBOT ASSISTED PYELOPLASTY Right 06/07/2018   Procedure: attempted XI ROBOTIC ASSISTED PYELOPLASTY, lysis of adhesions;  Surgeon: Malen Gauze, MD;  Location: [facility];  Service: Urology;  Laterality: Right;  3 HRS   SKIN BIOPSY     SPACE OAR INSTILLATION N/A 07/28/2019   Procedure: SPACE OAR INSTILLATION;  Surgeon: Malen Gauze, MD;  Location[facility];  Service: Urology;  Laterality: N/A;   URETEROSCOPY WITH HOLMIUM LASER LITHOTRIPSY Right 09/24/2017   Procedure: CYSTOSCOPY, URETEROSCOPY WITH HOLMIUM LASER LITHOTRIPSY, STENT PLACEMENT;  Surgeon: Ihor Gully, MD;  Location: [facility];  Service: Urology;  Laterality: Right;   Social History   Tobacco Use   Smoking  status: Former    Current packs/day: 0.00    Average packs/day: 1 pack/day for 6.0 years (6.0 ttl pk-yrs)    Types: Cigarettes    Start date: 01/11/1969    Quit date: 01/12/1969    Years since quitting: 54.3   Smokeless tobacco: Never   Tobacco comments:    1965-1971  Vaping Use   Vaping status: Never Used  Substance Use Topics   Alcohol use: Not Currently    Comment: 2 whiskey drinks per day   Drug use: Never   Social History  Socioeconomic History   Marital status: Married    Spouse name: Not on file   Number of children: 3   Years of education: Not on file   Highest education level: Bachelor's degree (e.g., BA, AB, BS)  Occupational History   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[employer]    Employer: [employer]  Tobacco Use   Smoking status: Former    Current packs/day: 0.00    Average packs/day: 1 pack/day for 6.0 years (6.0 ttl pk-yrs)    Types: Cigarettes    Start date: 01/11/1969    Quit date: 01/12/1969    Years since quitting: 54.3   Smokeless tobacco: Never   Tobacco comments:    1965-1971  Vaping Use   Vaping status: Never Used  Substance and Sexual Activity   Alcohol use: Not Currently    Comment: 2 whiskey drinks per day   Drug use: Never   Sexual activity: Yes  Other Topics Concern   Not on file  Social History Narrative   The patient is married for the second time, he has 3 sons.   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   2 alcoholic beverages most days.   1 caffeinated beverage daily   No drug use no current tobacco use he is a prior smoker   12/24/2016   Social Drivers of Health   Financial Resource Strain: Medium Risk (03/02/2023)   Overall Financial Resource Strain (CARDIA)    Difficulty of Paying Living Expenses: Somewhat hard  Food Insecurity: Food Insecurity Present (03/02/2023)   Hunger Vital Sign    Worried About Running Out of Food in the Last Year: Sometimes true    Ran Out of Food in the Last Year: Never true  Transportation Needs: No Transportation Needs  (03/02/2023)   PRAPARE - Administrator, Civil Service (Medical): No    Lack of Transportation (Non-Medical): No  Physical Activity: Inactive (04/05/2023)   Exercise Vital Sign    Days of Exercise per Week: 0 days    Minutes of Exercise per Session: 0 min  Stress: Stress Concern Present (04/05/2023)   [employer] of Occupational Health - Occupational Stress Questionnaire    Feeling of Stress : To some extent  Social Connections: Moderately Isolated (03/02/2023)   Social Connection and Isolation Panel [NHANES]    Frequency of Communication with Friends and Family: More than three times a week    Frequency of Social Gatherings with Friends and Family: Twice a week    Attends Religious Services: Never    Database administrator or Organizations: No    Attends Engineer, structural: Not on file    Marital Status: Married  Catering manager Violence: Not At Risk (03/02/2023)   Humiliation, Afraid, Rape, and Kick questionnaire    Fear of Current or Ex-Partner: No    Emotionally Abused: No    Physically Abused: No    Sexually Abused: No   Family Status  Relation Name Status   Mother  Deceased at age 66   Father  Deceased at age 17   Sister Pat Alive       24   Brother Jess Alive       63   Brother Leisure centre manager Alive       [address] Alive       59   Brother Iantha Fallen Alive       58   Brother John Alive       56   Mat Aunt x6  Deceased   Mat Uncle x5 Deceased   MGM  Deceased at age 72   MGF  Deceased at age 108   PGM  Deceased at age 54   PGF  Deceased at age 6   Son [name] Alive       37/ healthy   Son Molli Hazard Alive       35/ healthy   Son Designer, multimedia Alive       26/ healthy   Nephew  Deceased   Niece  Deceased   Neg Hx  (Not Specified)  No partnership data on file   Family History  Problem Relation Age of Onset   Heart disease Mother    Hypertension Mother    Stroke Mother    Colon cancer Mother 19   Breast cancer Mother 49   Heart disease Father         pacemaker   Aortic aneurysm Father    Hypertension Father    Lung cancer Father 34   Heart disease Sister    Atrial fibrillation Sister    Obesity Sister    Sleep apnea Sister    Heart attack Brother    Other Brother        muscle disease   Arthritis Brother    Stroke Brother    Atrial fibrillation Brother    Heart attack Brother    Atrial fibrillation Brother    Diabetes Brother    Atrial fibrillation Brother    Heart attack Brother    Hypertension Brother    Hyperlipidemia Brother    Heart attack Brother    Other Brother        heart valve operation   Atrial fibrillation Brother    Heart attack Maternal Grandmother    Diabetes Maternal Grandmother    Cancer Maternal Grandfather 44       hodgin's lymphoma   Heart attack Paternal Grandmother    Anxiety disorder Paternal Grandmother    Pneumonia Paternal Grandfather    Liver cancer Nephew 2       great nephew; d. 7   Lymphoma Niece 66   Esophageal cancer Neg Hx    Rectal cancer Neg Hx    Stomach cancer Neg Hx    Colon polyps Neg Hx    Pancreatic cancer Neg Hx    Allergies  Allergen Reactions   Cefaclor Hives   Cephalosporins Hives   Penicillins Rash    Mild maculopapular rash Has patient had a PCN reaction causing immediate rash, facial/tongue/throat swelling, SOB or lightheadedness with hypotension: No Has patient had a PCN reaction causing severe rash involving mucus membranes or skin necrosis: No Has patient had a PCN reaction that required hospitalization: No Has patient had a PCN reaction occurring within the last 10 years: No If all of the above answers are "NO", then may proceed with Cephalosporin use.       Review of Systems  Constitutional:  Negative for fever and malaise/fatigue.  HENT:  Negative for congestion.   Eyes:  Negative for blurred vision.  Respiratory:  Negative for cough and shortness of breath.   Cardiovascular:  Negative for chest pain, palpitations and leg swelling.   Gastrointestinal:  Negative for abdominal pain, blood in stool, nausea and vomiting.  Genitourinary:  Negative for dysuria and frequency.  Musculoskeletal:  Negative for back pain and falls.  Skin:  Negative for rash.  Neurological:  Positive for weakness. Negative for dizziness, loss of consciousness and headaches.  Endo/Heme/Allergies:  Negative for environmental  allergies.  Psychiatric/Behavioral:  Positive for memory loss. Negative for depression. The patient is not nervous/anxious.       Objective:     BP 128/62   Pulse (!) 56   Temp 98.2 F (36.8 C)   Resp 18   Ht 5\' 11"  (1.803 m)   Wt 223 lb (101.2 kg)   SpO2 94%   BMI 31.10 kg/m  BP Readings from Last 3 Encounters:  04/20/23 128/62  04/16/23 128/67  04/06/23 124/70   Wt Readings from Last 3 Encounters:  04/20/23 223 lb (101.2 kg)  04/16/23 225 lb (102.1 kg)  04/06/23 229 lb (103.9 kg)   SpO2 Readings from Last 3 Encounters:  04/20/23 94%  04/16/23 97%  04/02/23 98%      Physical Exam Vitals and nursing note reviewed.  Constitutional:      General: He is not in acute distress.    Appearance: Normal appearance. He is well-developed.  HENT:     Head: Normocephalic and atraumatic.  Eyes:     General: No scleral icterus.       Right eye: No discharge.        Left eye: No discharge.  Cardiovascular:     Rate and Rhythm: Normal rate and regular rhythm.     Heart sounds: No murmur heard. Pulmonary:     Effort: Pulmonary effort is normal. No respiratory distress.     Breath sounds: Normal breath sounds.  Musculoskeletal:        General: Tenderness present. Normal range of motion.     Cervical back: Normal range of motion and neck supple.     Right lower leg: No edema.     Left lower leg: No edema.  Skin:    General: Skin is warm and dry.  Neurological:     Mental Status: He is alert.     Motor: Weakness present.     Gait: Gait abnormal.     Comments: 3/5 strength L hip flexor Foot drop with L foot  with walking   Psychiatric:        Mood and Affect: Mood normal.        Behavior: Behavior normal.        Thought Content: Thought content normal.        Judgment: Judgment normal.      No results found for any visits on 04/20/23.  Last CBC Lab Results  Component Value Date   WBC 4.8 04/16/2023   HGB 11.7 (L) 04/16/2023   HCT 34.5 (L) 04/16/2023   MCV 95.5 04/16/2023   MCH 32.9 03/31/2023   RDW 14.4 04/16/2023   PLT 181.0 04/16/2023   Last metabolic panel Lab Results  Component Value Date   GLUCOSE 106 (H) 04/16/2023   NA 138 04/16/2023   K 3.9 04/16/2023   CL 102 04/16/2023   CO2 28 04/16/2023   BUN 19 04/16/2023   CREATININE 1.60 (H) 04/16/2023   GFR 41.18 (L) 04/16/2023   CALCIUM 10.1 04/16/2023   PHOS 3.7 02/23/2014   PROT 6.2 04/16/2023   ALBUMIN 4.1 04/16/2023   BILITOT 0.9 04/16/2023   ALKPHOS 66 04/16/2023   AST 22 04/16/2023   ALT 19 04/16/2023   ANIONGAP 11 03/31/2023   Last lipids Lab Results  Component Value Date   CHOL 112 10/26/2022   HDL 35.80 (L) 10/26/2022   LDLCALC 36 10/26/2022   LDLDIRECT 168.5 03/29/2006   TRIG 201.0 (H) 10/26/2022   CHOLHDL 3 10/26/2022   Last hemoglobin  A1c Lab Results  Component Value Date   HGBA1C 5.9 10/26/2022   Last thyroid functions Lab Results  Component Value Date   TSH 2.28 04/16/2023   Last vitamin D Lab Results  Component Value Date   VD25OH 33 12/20/2020   Last vitamin B12 and Folate Lab Results  Component Value Date   VITAMINB12 1,163 (H) 04/16/2023   FOLATE 11.7 04/16/2023      The ASCVD Risk score (Arnett DK, et al., 2019) failed to calculate for the following reasons:   The valid total cholesterol range is 130 to 320 mg/dL    Assessment & Plan:   Problem List Items Addressed This Visit       Unprioritized   Low back pain with radiation - Primary   Depo medrol 80 mg IM Pred taper  Pt has pain med to take  PT to start next week       Relevant Medications   predniSONE  (DELTASONE) 10 MG tablet  Assessment and Plan Assessment & Plan Frequent Falls   He reports an increase in falls, experiencing five or six in the last month compared to only two in the previous sixty years. These falls are likely due to balance issues, possibly worsened by leg weakness and back pain. Gabapentin, prescribed for back pain, may contribute to balance issues and mental confusion, increasing fall risk. Discontinue gabapentin to reduce fall risk and start physical therapy on April 14 to improve balance.  Left Leg Pain and Weakness   He has severe left leg pain, leading to four emergency room visits, accompanied by weakness, particularly in the left leg. This is suspected to be related to sciatica or another lumbar spine issue. An MRI of the lumbar spine has been performed, but results are pending. The pain typically reaches the knees, suggesting lumbar spine involvement, possibly at L2-L3 or L4-L5. Administer Depo Medrol steroid injection to reduce inflammation and prescribe a prednisone taper starting April 9. Follow up on MRI results with Dr. Margaretha Sheffield for further evaluation and management.  Cognitive Impairment   He experiences memory issues, confusion, hallucinations, and short-term memory decline, which have worsened since starting gabapentin. Previous testing ruled out Alzheimer's disease, but brain atrophy was noted. Gabapentin may be exacerbating cognitive symptoms. Discontinue gabapentin to assess improvement in cognitive function and monitor cognitive status after discontinuation.  Chronic Kidney Disease   He has chronic kidney disease with a creatinine level of 1.6 and a GFR of 41, requiring careful consideration when prescribing medications. Avoid Toradol due to potential renal side effects.  Prostate Cancer   He is undergoing androgen deprivation therapy (ADT) for prostate cancer, receiving injections twice a year, with the next injection scheduled for July. There is no  anticipated interaction between the steroid treatment and ADT. Continue monitoring the prostate cancer treatment schedule.  Follow-up   He has several follow-up appointments and plans in place to address his current health issues. Attend physical therapy starting April 14, follow up with Dr. Margaretha Sheffield after MRI results are available, and attend the rescheduled appointment with Dr. Jose Persia on April 15.    Return if symptoms worsen or fail to improve.    Donato Schultz, DO

## 2023-04-20 NOTE — Assessment & Plan Note (Signed)
 Depo medrol 80 mg IM Pred taper  Pt has pain med to take  PT to start next week

## 2023-04-21 ENCOUNTER — Ambulatory Visit: Admitting: Adult Health

## 2023-04-22 ENCOUNTER — Ambulatory Visit: Admitting: Neurology

## 2023-04-22 ENCOUNTER — Encounter: Payer: Self-pay | Admitting: Neurology

## 2023-04-22 VITALS — BP 174/80 | HR 69 | Ht 71.0 in | Wt 223.0 lb

## 2023-04-22 DIAGNOSIS — F03B3 Unspecified dementia, moderate, with mood disturbance: Secondary | ICD-10-CM | POA: Diagnosis not present

## 2023-04-22 DIAGNOSIS — R269 Unspecified abnormalities of gait and mobility: Secondary | ICD-10-CM | POA: Insufficient documentation

## 2023-04-22 MED ORDER — QUETIAPINE FUMARATE 25 MG PO TABS: 25.0000 mg | ORAL_TABLET | Freq: Every day | ORAL | 5 refills | Status: DC

## 2023-04-22 NOTE — Progress Notes (Signed)
 Chief Complaint  Patient presents with   Follow-up    Rm14, son Ace Holder) & wife (ginger) present, Memory concerns: see mychart messages, through phone calls, seriously confused, not wanting to wear clothes, doesn't know where he is most of the time. SEARCHES FOR DECEASED FATHER. NON COMBATIVE.       ASSESSMENT AND PLAN  David [name] is a 78 y.o. male   Dementia with agitation  MoCA examination 22/30  MRI of brain showed generalized atrophy small vessel disease there was no acute abnormality  Laboratory evaluation showed low normal B12 217, otherwise no treatable cause, advised B12 supplement,  Most suggestive of central nervous system degenerative disorder, such as Alzheimer's disease  Add on Seroquel 25 mg at night  Will also discussed the possibility of slow trending over time, long-term care plan  Return To Clinic With NP In 6 Months   DIAGNOSTIC DATA (LABS, IMAGING, TESTING) - I reviewed patient records, labs, notes, testing and imaging myself where available.   MEDICAL HISTORY:  David [name], is a 78 year year old male seen in request by his primary care doctor Neda Balk, for evaluation of memory loss, he is accompanied by his wife and son at today's visit November 30, 2022  History is obtained from the patient and review of electronic medical records. I personally reviewed pertinent available imaging films in PACS.   PMHx of  HLD HTN Gout History of A fib-on Eliquis History of kidney stone Prostate cancer,  s/p radiation therapy,   Patient is retired Airline pilot at age 38, used to be very physically active, but in recent couple years, he has to deal with his prostate cancer, radiation, which has caused a lot of irritation in his pelvic region, now become more sedentary, enjoying watch TV, reading books, still walking his neighborhood,  Over the past couple years, he was noted to have gradual onset of memory loss, his son gave me an example of him forgetting  how to use his cell phone, go to the operating TV remote control, sometimes worse than the others,  he denies hallucinations, 2023 was the first year for him to let other people to work on his personal income tax, denies gait abnormality, no family history of dementia  PET scan in April 2024,  1. Low-level, relatively diffuse tracer affinity throughout the prostate is decreased since 11/11/2020. Residual or recurrent local disease cannot be excluded. 2. No evidence of tracer avid nodal, osseous, or distant metastasis. 3. Ascending aortic dilatation at 4.3 cm. Please see 11/17/2021 dedicated CTA. 4. Incidental findings, including: Coronary artery atherosclerosis. Aortic Atherosclerosis (ICD10-I70.0). Left nephrolithiasis. Hepatic steatosis.  UPDATE April 22 2023: He is accompanied by his wife and son at today's visit, for increased confusion, they are in the process of moving to an apartment, has boxes around, he has difficulty sleeping, wanders at home,  he has trouble walking,  left knee pain, see orthopedics, was put on gabapentin 300mg  bid end of March, did help his low back and left leg pain, but his confusion, memory loss is getting worse, now he is off gabapentin,   MRI of the brain December 2024 showed mild generalized atrophy small vessel disease   PHYSICAL EXAM:   Vitals:   04/22/23 1429  BP: (!) 174/80  Pulse: 69  Weight: 223 lb (101.2 kg)  Height: 5\' 11"  (1.803 m)   Body mass index is 31.1 kg/m.  PHYSICAL EXAMNIATION:  Gen: NAD, conversant, well nourised, well groomed  Cardiovascular: Regular rate rhythm, no peripheral edema, warm, nontender. Eyes: Conjunctivae clear without exudates or hemorrhage Neck: Supple, no carotid bruits. Pulmonary: Clear to auscultation bilaterally   NEUROLOGICAL EXAM:  MENTAL STATUS: Speech/cognition: Awake, alert, oriented to history taking and casual conversation    04/22/2023    2:19 PM 11/30/2022    3:47 PM   Montreal Cognitive Assessment   Visuospatial/ Executive (0/5) 3 5  Naming (0/3) 3 2  Attention: Read list of digits (0/2) 2 2  Attention: Read list of letters (0/1) 1 1  Attention: Serial 7 subtraction starting at 100 (0/3) 3 3  Language: Repeat phrase (0/2) 2 2  Language : Fluency (0/1) 1 1  Abstraction (0/2) 2 2  Delayed Recall (0/5) 0 0  Orientation (0/6) 5 4  Total 22 22    CRANIAL NERVES: CN II: Visual fields are full to confrontation. Pupils are round equal and briskly reactive to light. CN III, IV, VI: extraocular movement are normal. No ptosis. CN V: Facial sensation is intact to light touch CN VII: Face is symmetric with normal eye closure  CN VIII: Hearing is normal to causal conversation. CN IX, X: Phonation is normal. CN XI: Head turning and shoulder shrug are intact  MOTOR: There is no pronator drift of out-stretched arms. Muscle bulk and tone are normal. Muscle strength is normal.  REFLEXES: Reflexes are 2+ and symmetric at the biceps, triceps, knees, and ankles. Plantar responses are flexor.  SENSORY: Intact to light touch, pinprick and vibratory sensation are intact in fingers and toes.  COORDINATION: There is no trunk or limb dysmetria noted.  GAIT/STANCE: Able to get up from seated position arm crossed, steady  REVIEW OF SYSTEMS:  Full 14 system review of systems performed and notable only for as above All other review of systems were negative.   ALLERGIES: Allergies  Allergen Reactions   Cefaclor Hives   Cephalosporins Hives   Penicillins Rash    Mild maculopapular rash Has patient had a PCN reaction causing immediate rash, facial/tongue/throat swelling, SOB or lightheadedness with hypotension: No Has patient had a PCN reaction causing severe rash involving mucus membranes or skin necrosis: No Has patient had a PCN reaction that required hospitalization: No Has patient had a PCN reaction occurring within the last 10 years: No If all of the  above answers are "NO", then may proceed with Cephalosporin use.     HOME MEDICATIONS: Current Outpatient Medications  Medication Sig Dispense Refill   acetaminophen (TYLENOL) 325 MG tablet Take 2 tablets (650 mg total) by mouth every 6 (six) hours as needed. 36 tablet 0   allopurinol (ZYLOPRIM) 100 MG tablet TAKE 1/2 TABLET BY MOUTH DAILY 45 tablet 1   amLODipine (NORVASC) 2.5 MG tablet Take 1 tablet (2.5 mg total) by mouth daily. 90 tablet 3   apixaban (ELIQUIS) 5 MG TABS tablet Take 1 tablet (5 mg total) by mouth 2 (two) times daily. 180 tablet 3   apixaban (ELIQUIS) 5 MG TABS tablet Take 1 tablet (5 mg total) by mouth 2 (two) times daily. 42 tablet 0   ascorbic acid (VITAMIN C) 1000 MG tablet Take by mouth.     atenolol (TENORMIN) 50 MG tablet TAKE 1 TABLET BY MOUTH 2 TIMES A DAY 180 tablet 1   atorvastatin (LIPITOR) 10 MG tablet Take 1 tablet (10 mg total) by mouth every morning. 90 tablet 1   Cholecalciferol (VITAMIN D-1000 MAX ST) 25 MCG (1000 UT) tablet Take by mouth.  clotrimazole-betamethasone (LOTRISONE) cream Apply 1 Application topically 2 (two) times daily. 45 g 1   colchicine 0.6 MG tablet For gout flares, take 1 tab po once then repeat in 1 hour as needed if pain persists up to a total of 3 doses 15 tablet 0   FLUoxetine (PROZAC) 20 MG tablet Take 1 tablet (20 mg total) by mouth 2 (two) times daily. 180 tablet 1   fluticasone (FLONASE) 50 MCG/ACT nasal spray Place 2 sprays into both nostrils daily. (Patient taking differently: Place 2 sprays into both nostrils as needed.) 16 g 1   gabapentin (NEURONTIN) 300 MG capsule Take 1 capsule (300 mg total) by mouth 2 (two) times daily as needed. (Patient taking differently: Take 300 mg by mouth 2 (two) times daily.) 60 capsule 3   HYDROcodone-acetaminophen (NORCO/VICODIN) 5-325 MG tablet Take 1 tablet by mouth every 8 (eight) hours as needed for moderate pain (pain score 4-6). 15 tablet 0   ketoconazole (NIZORAL) 2 % cream APPLY A  THIN LAYER TOPICALLY DAILY 60 g 2   lidocaine (LIDODERM) 5 % Place 1 patch onto the skin daily. Remove & Discard patch within 12 hours or as directed by MD 30 patch 0   LORazepam (ATIVAN) 1 MG tablet Take 1 tablet (1 mg total) by mouth every 8 (eight) hours as needed. for anxiety 90 tablet 0   omeprazole (PRILOSEC) 20 MG capsule Take 20 mg by mouth daily as needed (acid reflux).      predniSONE (DELTASONE) 10 MG tablet TAKE 3 TABLETS PO QD FOR 3 DAYS THEN TAKE 2 TABLETS PO QD FOR 3 DAYS THEN TAKE 1 TABLET PO QD FOR 3 DAYS THEN TAKE 1/2 TAB PO QD FOR 3 DAYS 20 tablet 0   tamsulosin (FLOMAX) 0.4 MG CAPS capsule Take 1 capsule (0.4 mg total) by mouth daily after supper. 90 capsule 0   triamcinolone (KENALOG) 0.025 % cream      No current facility-administered medications for this visit.    PAST MEDICAL HISTORY: Past Medical History:  Diagnosis Date   Adenoma of right adrenal gland 07/11/2002   2.4cm , noted on CT ABD   Anxiety    Arthritis of both knees 03/26/2016   Astigmatism    Back pain    BCC (basal cell carcinoma of skin)    under right eye and right ear   Blood transfusion without reported diagnosis    Cataract 09/29/2016   Depression    Diverticulitis 2009   Diverticulosis    Family history of breast cancer    Family history of colon cancer    Fatty liver    GERD (gastroesophageal reflux disease)    Gout    Hearing loss of both ears 03/26/2016   History of atrial fibrillation    x 2 none since 2020   History of chronic prostatitis    started at age 25   History of kidney stones    Hx of adenomatous colonic polyps 02/16/2022   3 diminutive - no recall   Hyperglycemia    Hyperlipidemia    Hypertension    Incomplete right bundle branch block (RBBB)    Internal hemorrhoids    Kidney lesion 06/07/2015   Right midportion, 1.1x1.1 cm hyperechoic, noted on US  ABD   LAFB (left anterior fascicular block)    Lipoma of axilla 09/2016   Liver lesion, right lobe 06/07/2015    2.5x2.4x2.4 cm hypoechoic lesion posterior aspect, noted on US  ABD   Low back pain 03/26/2016  LVH (left ventricular hypertrophy) 08/06/2017   Moderate, Noted on ECHO   Medicare annual wellness visit, subsequent 03/12/2014   OA (osteoarthritis)    Back, Hands, Neck   Obesity 11/25/2007   Qualifier: Diagnosis of  By: Elinor Guardian MD, Raenelle Bumpers    Other malaise and fatigue 03/13/2013   Premature ventricular complex    Prostate cancer (HCC) dx 2021   Renal insufficiency    Kidney removed right   Scoliosis    Upper thoracic and lumbar   Sinus bradycardia 08/06/2017   Noted on EKG   Vitamin D deficiency    resolved    PAST SURGICAL HISTORY: Past Surgical History:  Procedure Laterality Date   CARDIOVERSION  07/31/2017   CATARACT EXTRACTION, BILATERAL     COLON SURGERY  2009   segmental sigmoid resection   COLONOSCOPY     COLONOSCOPY WITH PROPOFOL     CYSTOSCOPY N/A 07/28/2019   Procedure: CYSTOSCOPY FLEXIBLE;  Surgeon: Marco Severs, MD;  Location[facility];  Service: Urology;  Laterality: N/A;   CYSTOSCOPY W/ RETROGRADES Right 06/07/2018   Procedure: CYSTOSCOPY WITH RETROGRADE PYELOGRAM, uphrostogram;  Surgeon: Marco Severs, MD;  Location: [facility];  Service: Urology;  Laterality: Right;   CYSTOSCOPY WITH URETEROSCOPY AND STENT PLACEMENT Right 02/28/2018   Procedure: CYSTOSCOPY WITH URETEROSCOPY Sallyann Crea;  Surgeon: Ottelin, Mark, MD;  Location: [facility];  Service: Urology;  Laterality: Right;   CYSTOSCOPY/URETEROSCOPY/HOLMIUM LASER/STENT PLACEMENT Right 11/29/2017   Procedure: CYSTOSCOPY/RETROGRADE/URETEROSCOPY/HOLMIUM LASER/STENT PLACEMENT;  Surgeon: Ottelin, Mark, MD;  Location: [facility];  Service: Urology;  Laterality: Right;   EYE SURGERY Bilateral 01/12/2017   cataract removal   history of blood tranfusion  age 36   HOLMIUM LASER APPLICATION Right 09/24/2017   Procedure: HOLMIUM LASER APPLICATION;  Surgeon: Ottelin, Mark, MD;   Location: [facility];  Service: Urology;  Laterality: Right;   IR BALLOON DILATION URETERAL STRICTURE RIGHT  03/14/2018   IR NEPHRO TUBE REMOV/FL  03/17/2018   IR NEPHROSTOGRAM RIGHT THRU EXISTING ACCESS  03/17/2018   IR NEPHROSTOMY EXCHANGE RIGHT  03/14/2018   IR NEPHROSTOMY EXCHANGE RIGHT  06/21/2018   IR NEPHROSTOMY PLACEMENT RIGHT  03/02/2018   IR NEPHROSTOMY PLACEMENT RIGHT  04/20/2018   IR URETERAL STENT PLACEMENT EXISTING ACCESS RIGHT  03/14/2018   NOSE SURGERY     Submucous resection age 37   NUCLEAR STRESS TEST  06/03/2009   RADIOACTIVE SEED IMPLANT N/A 07/28/2019   Procedure: RADIOACTIVE SEED IMPLANT/BRACHYTHERAPY IMPLANT;  Surgeon: Marco Severs, MD;  Location: [facility];  Service: Urology;  Laterality: N/A;  90 MINS   ROBOT ASSISTED LAPAROSCOPIC NEPHRECTOMY Right 08/04/2018   Procedure: XI ROBOTIC ASSISTED LAPAROSCOPIC NEPHRECTOMY;  Surgeon: Marco Severs, MD;  Location: [facility];  Service: Urology;  Laterality: Right;  3 HRS   ROBOT ASSISTED PYELOPLASTY Right 06/07/2018   Procedure: attempted XI ROBOTIC ASSISTED PYELOPLASTY, lysis of adhesions;  Surgeon: Marco Severs, MD;  Location: [facility];  Service: Urology;  Laterality: Right;  3 HRS   SKIN BIOPSY     SPACE OAR INSTILLATION N/A 07/28/2019   Procedure: SPACE OAR INSTILLATION;  Surgeon: Marco Severs, MD;  Location: [facility];  Service: Urology;  Laterality: N/A;   URETEROSCOPY WITH HOLMIUM LASER LITHOTRIPSY Right 09/24/2017   Procedure: CYSTOSCOPY, URETEROSCOPY WITH HOLMIUM LASER LITHOTRIPSY, STENT PLACEMENT;  Surgeon: Ottelin, Mark, MD;  Location: [facility];  Service: Urology;  Laterality: Right;    FAMILY HISTORY: Family  History  Problem Relation Age of Onset   Heart disease Mother    Hypertension Mother    Stroke Mother    Colon cancer Mother 36   Breast cancer Mother 4   Heart disease Father        pacemaker   Aortic  aneurysm Father    Hypertension Father    Lung cancer Father 28   Heart disease Sister    Atrial fibrillation Sister    Obesity Sister    Sleep apnea Sister    Heart attack Brother    Other Brother        muscle disease   Arthritis Brother    Stroke Brother    Atrial fibrillation Brother    Heart attack Brother    Atrial fibrillation Brother    Diabetes Brother    Atrial fibrillation Brother    Heart attack Brother    Hypertension Brother    Hyperlipidemia Brother    Heart attack Brother    Other Brother        heart valve operation   Atrial fibrillation Brother    Heart attack Maternal Grandmother    Diabetes Maternal Grandmother    Cancer Maternal Grandfather 44       hodgin's lymphoma   Heart attack Paternal Grandmother    Anxiety disorder Paternal Grandmother    Pneumonia Paternal Grandfather    Liver cancer Nephew 2       great nephew; d. 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[employer]    Employer: [employer]  Tobacco Use   Smoking status: Former    Current packs/day: 0.00    Average packs/day: 1 pack/day for 6.0 years (6.0 ttl pk-yrs)    Types: Cigarettes    Start date: 01/11/1969    Quit date: 01/12/1969    Years since quitting: 54.3   Smokeless tobacco: Never   Tobacco comments:    1965-1971  Vaping Use   Vaping status: Never Used  Substance and Sexual Activity   Alcohol use: Not Currently    Comment: 2 whiskey drinks per day   Drug use: Never   Sexual activity: Yes  Other Topics Concern   Not on file  Social History Narrative   The patient is married for the second time, he  has 3 sons.   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   2 alcoholic beverages most days.   1 caffeinated beverage daily   No drug use no current tobacco use he is a prior smoker   12/24/2016   Social Drivers of Health   Financial Resource Strain: Medium Risk (03/02/2023)   Overall Financial Resource Strain (CARDIA)    Difficulty of Paying Living Expenses: Somewhat hard  Food Insecurity: Food Insecurity Present (03/02/2023)   Hunger Vital Sign    Worried About Running Out of Food in the Last Year: Sometimes true    Ran Out of Food in the Last Year: Never true  Transportation Needs: No Transportation Needs (  03/02/2023)   PRAPARE - Administrator, Civil Service (Medical): No    Lack of Transportation (Non-Medical): No  Physical Activity: Inactive (04/05/2023)   Exercise Vital Sign    Days of Exercise per Week: 0 days    Minutes of Exercise per Session: 0 min  Stress: Stress Concern Present (04/05/2023)   [employer] of Occupational Health - Occupational Stress Questionnaire    Feeling of Stress : To some extent  Social Connections: Moderately Isolated (03/02/2023)   Social Connection and Isolation Panel [NHANES]    Frequency of Communication with Friends and Family: More than three times a week    Frequency of Social Gatherings with Friends and Family: Twice a week    Attends Religious Services: Never    Database administrator or Organizations: No    Attends Engineer, structural: Not on file    Marital Status: Married  Catering manager Violence: Not At Risk (03/02/2023)   Humiliation, Afraid, Rape, and Kick questionnaire    Fear of Current or Ex-Partner: No    Emotionally Abused: No    Physically Abused: No    Sexually Abused: No      Phebe Brasil, M.D. Ph.D.  [facility] [address] [place], [place] [address] Ph: [phone] Fax: [phone]  CC:  Neda Balk, MD [address] [place],  [place]  [address]  Neda Balk, MD    Total time spent reviewing the chart, obtaining history, examined patient, ordering tests, documentation, consultations and family, care coordination was  40 minutes

## 2023-04-26 ENCOUNTER — Other Ambulatory Visit: Payer: Self-pay

## 2023-04-26 ENCOUNTER — Ambulatory Visit: Attending: Sports Medicine

## 2023-04-26 DIAGNOSIS — G8929 Other chronic pain: Secondary | ICD-10-CM | POA: Insufficient documentation

## 2023-04-26 DIAGNOSIS — M533 Sacrococcygeal disorders, not elsewhere classified: Secondary | ICD-10-CM | POA: Insufficient documentation

## 2023-04-26 DIAGNOSIS — M5416 Radiculopathy, lumbar region: Secondary | ICD-10-CM | POA: Diagnosis not present

## 2023-04-26 DIAGNOSIS — M5459 Other low back pain: Secondary | ICD-10-CM | POA: Insufficient documentation

## 2023-04-26 DIAGNOSIS — M25561 Pain in right knee: Secondary | ICD-10-CM | POA: Diagnosis not present

## 2023-04-26 DIAGNOSIS — M6281 Muscle weakness (generalized): Secondary | ICD-10-CM | POA: Insufficient documentation

## 2023-04-26 NOTE — Therapy (Signed)
 OUTPATIENT PHYSICAL THERAPY THORACOLUMBAR EVALUATION   Patient Name: David [name] MRN: [id number] DOB:09-14-1945, 78 y.o., male Today's Date: 04/27/2023  END OF SESSION:  PT End of Session - 04/26/23 1444     Visit Number 1    Date for PT Re-Evaluation 06/21/23    Progress Note Due on Visit 10    PT Start Time 1445    PT Stop Time 1530    PT Time Calculation (min) 45 min    Activity Tolerance Patient tolerated treatment well    Behavior During Therapy [facility] for tasks assessed/performed             Past Medical History:  Diagnosis Date   Adenoma of right adrenal gland 07/11/2002   2.4cm , noted on CT ABD   Anxiety    Arthritis of both knees 03/26/2016   Astigmatism    Back pain    BCC (basal cell carcinoma of skin)    under right eye and right ear   Blood transfusion without reported diagnosis    Cataract 09/29/2016   Depression    Diverticulitis 2009   Diverticulosis    Family history of breast cancer    Family history of colon cancer    Fatty liver    GERD (gastroesophageal reflux disease)    Gout    Hearing loss of both ears 03/26/2016   History of atrial fibrillation    x 2 none since 2020   History of chronic prostatitis    started at age 71   History of kidney stones    Hx of adenomatous colonic polyps 02/16/2022   3 diminutive - no recall   Hyperglycemia    Hyperlipidemia    Hypertension    Incomplete right bundle branch block (RBBB)    Internal hemorrhoids    Kidney lesion 06/07/2015   Right midportion, 1.1x1.1 cm hyperechoic, noted on [place] ABD   LAFB (left anterior fascicular block)    Lipoma of axilla 09/2016   Liver lesion, right lobe 06/07/2015   2.5x2.4x2.4 cm hypoechoic lesion posterior aspect, noted on [place] ABD   Low back pain 03/26/2016   LVH (left ventricular hypertrophy) 08/06/2017   Moderate, Noted on ECHO   Medicare annual wellness visit, subsequent 03/12/2014   OA (osteoarthritis)    Back, Hands, Neck   Obesity 11/25/2007    Qualifier: Diagnosis of  By: Kriste Basque MD, Lonzo Cloud    Other malaise and fatigue 03/13/2013   Premature ventricular complex    Prostate cancer (HCC) dx 2021   Renal insufficiency    Kidney removed right   Scoliosis    Upper thoracic and lumbar   Sinus bradycardia 08/06/2017   Noted on EKG   Vitamin D deficiency    resolved   Past Surgical History:  Procedure Laterality Date   CARDIOVERSION  07/31/2017   CATARACT EXTRACTION, BILATERAL     COLON SURGERY  2009   segmental sigmoid resection   COLONOSCOPY     COLONOSCOPY WITH PROPOFOL     CYSTOSCOPY N/A 07/28/2019   Procedure: CYSTOSCOPY FLEXIBLE;  Surgeon: Malen Gauze, MD;  Location: [facility];  Service: Urology;  Laterality: N/A;   CYSTOSCOPY W/ RETROGRADES Right 06/07/2018   Procedure: CYSTOSCOPY WITH RETROGRADE PYELOGRAM, uphrostogram;  Surgeon: Malen Gauze, MD;  Location: [facility];  Service: Urology;  Laterality: Right;   CYSTOSCOPY WITH URETEROSCOPY AND STENT PLACEMENT Right 02/28/2018   Procedure: CYSTOSCOPY WITH URETEROSCOPY Iverson Alamin;  Surgeon: Ihor Gully, MD;  Location: [facility];  Service: Urology;  Laterality: Right;   CYSTOSCOPY/URETEROSCOPY/HOLMIUM LASER/STENT PLACEMENT Right 11/29/2017   Procedure: CYSTOSCOPY/RETROGRADE/URETEROSCOPY/HOLMIUM LASER/STENT PLACEMENT;  Surgeon: Ihor Gully, MD;  Location: [facility];  Service: Urology;  Laterality: Right;   EYE SURGERY Bilateral 01/12/2017   cataract removal   history of blood tranfusion  age 34   HOLMIUM LASER APPLICATION Right 09/24/2017   Procedure: HOLMIUM LASER APPLICATION;  Surgeon: Ihor Gully, MD;  Location: [facility];  Service: Urology;  Laterality: Right;   IR BALLOON DILATION URETERAL STRICTURE RIGHT  03/14/2018   IR NEPHRO TUBE REMOV/FL  03/17/2018   IR NEPHROSTOGRAM RIGHT THRU EXISTING ACCESS  03/17/2018   IR NEPHROSTOMY EXCHANGE RIGHT  03/14/2018   IR NEPHROSTOMY EXCHANGE RIGHT  06/21/2018   IR  NEPHROSTOMY PLACEMENT RIGHT  03/02/2018   IR NEPHROSTOMY PLACEMENT RIGHT  04/20/2018   IR URETERAL STENT PLACEMENT EXISTING ACCESS RIGHT  03/14/2018   NOSE SURGERY     Submucous resection age 29   NUCLEAR STRESS TEST  06/03/2009   RADIOACTIVE SEED IMPLANT N/A 07/28/2019   Procedure: RADIOACTIVE SEED IMPLANT/BRACHYTHERAPY IMPLANT;  Surgeon: Malen Gauze, MD;  Location: [facility];  Service: Urology;  Laterality: N/A;  90 MINS   ROBOT ASSISTED LAPAROSCOPIC NEPHRECTOMY Right 08/04/2018   Procedure: XI ROBOTIC ASSISTED LAPAROSCOPIC NEPHRECTOMY;  Surgeon: Malen Gauze, MD;  Location: [facility];  Service: Urology;  Laterality: Right;  3 HRS   ROBOT ASSISTED PYELOPLASTY Right 06/07/2018   Procedure: attempted XI ROBOTIC ASSISTED PYELOPLASTY, lysis of adhesions;  Surgeon: Malen Gauze, MD;  Location: [facility];  Service: Urology;  Laterality: Right;  3 HRS   SKIN BIOPSY     SPACE OAR INSTILLATION N/A 07/28/2019   Procedure: SPACE OAR INSTILLATION;  Surgeon: Malen Gauze, MD;  Location: [facility];  Service: Urology;  Laterality: N/A;   URETEROSCOPY WITH HOLMIUM LASER LITHOTRIPSY Right 09/24/2017   Procedure: CYSTOSCOPY, URETEROSCOPY WITH HOLMIUM LASER LITHOTRIPSY, STENT PLACEMENT;  Surgeon: Ihor Gully, MD;  Location: [facility];  Service: Urology;  Laterality: Right;   Patient Active Problem List   Diagnosis Date Noted   Gait abnormality 04/22/2023   Aortic root dilatation (HCC) 03/31/2023   Sacral pain 12/08/2022   Right shoulder pain 09/30/2022   Moderate dementia with mood disturbance (HCC) 07/12/2022   Chronic right ear pain 03/11/2022   Chronic anticoagulation 02/11/2022   CRI (chronic renal insufficiency) 10/29/2021   Abdominal pain 10/29/2021   Sun-damaged skin 07/23/2021   H/O prostate cancer 07/23/2021   Left knee pain 07/23/2021   Coronary atherosclerosis 02/11/2021   Myalgia 11/04/2020   Neck pain 11/04/2020    Stage 3a chronic kidney disease (HCC) 07/02/2020   Urinary urgency 02/12/2020   Colon cancer screening 09/26/2019   Family history of breast cancer    Family history of colon cancer    Educated about COVID-19 virus infection 08/26/2019   Prostate cancer (HCC) 04/28/2019   Pruritus 02/03/2019   LVH (left ventricular hypertrophy) 10/30/2018   Tinea versicolor 09/28/2018   Thoracic back pain 09/26/2018   Hypercalcemia 08/08/2018   Nonfunctioning kidney 08/04/2018   Arm pain 07/18/2018   Right hip pain 06/22/2018   Ureteral obstruction 06/07/2018   Recurrent nephrolithiasis 04/23/2018   Hydronephrosis 03/01/2018   Floppy eyelid syndrome of both eyes 01/24/2018   Foot pain, bilateral 12/28/2017   Kidney stone 09/18/2017   Insomnia 09/17/2017   Abnormal TSH 04/15/2017   Senile nuclear sclerosis 01/18/2017   Cataract  09/29/2016   Muscle cramp 09/29/2016   Nocturia 09/29/2016   Hyperglycemia 03/29/2016   Low back pain with radiation 03/26/2016   Cataracts, bilateral 03/26/2016   Arthritis of both knees 03/26/2016   Hearing loss 03/26/2016   Complex renal cyst 06/16/2015   History of atrial fibrillation 06/16/2015   Snoring 06/07/2015   Somnolence 06/07/2015   Preventative health care 03/07/2015   Lipoma of axilla 06/04/2014   Tinea cruris 03/20/2014   Lichen simplex chronicus 03/20/2014   Medicare annual wellness visit, subsequent 03/12/2014   Depression with anxiety 09/25/2013   Constipation 09/25/2013   Gouty arthritis of toe of left foot 08/01/2013   Skin lesion 05/27/2013   Rash 05/26/2013   Abnormal liver function 03/18/2013   IBS (irritable bowel syndrome) 03/18/2013   Other malaise and fatigue 03/13/2013   Tinea corporis 03/13/2013   Gout 12/14/2012   Diarrhea 12/14/2012   Rectal irritation 09/18/2012   Diverticulosis    BCC (basal cell carcinoma of skin)    Scoliosis 01/19/2011   Hyperlipidemia, mixed 03/11/2010   Essential hypertension, benign 03/11/2010    Allergic rhinitis 03/11/2010   Arthralgia 03/11/2010   Obesity 11/25/2007   BENIGN NEOPLASM OF ADRENAL GLAND 09/22/2007   Fatty liver disease, nonalcoholic 09/22/2007   Osteoarthritis 09/22/2007   SPONDYLOSIS, LUMBAR 09/22/2007   GERD 04/12/2007    PCP: Neda Balk., MD  REFERRING PROVIDER: Raydell Cahill DO  REFERRING DIAG: L lumbar radiculopathy  Rationale for Evaluation and Treatment: Rehabilitation  THERAPY DIAG:  Other low back pain  Muscle weakness (generalized)  Left lumbar radiculopathy  ONSET DATE: chronic  SUBJECTIVE:                                                                                                                                                                                           SUBJECTIVE STATEMENT: Patient attended with his wife, who assists with history.  Reports constant pain B lateral lower legs.  Not provoked with any specific position.  Wife reports quickly fatigues with gait   PERTINENT HISTORY:  Referred by sports medicine MD,   PAIN:  Are you having pain? Patient did not rate pain, reports deep and constant PRECAUTIONS: Fall  RED FLAGS: None   WEIGHT BEARING RESTRICTIONS: No  FALLS:  Has patient fallen in last 6 months? Yes. Number of falls 6  LIVING ENVIRONMENT: Lives with: lives with their family and lives with their spouse Lives in: House/apartment Stairs:  moving to an apartment with wife in 2 weeks that will be one level Has following equipment at home: [employer] - 2 wheeled and [employer] - 4 wheeled  OCCUPATION: retired  PLOF: Independent  PATIENT GOALS: pt /wife want to improve pain  NEXT MD VISIT: 06/24/23  OBJECTIVE:  Note: Objective measures were completed at Evaluation unless otherwise noted.  DIAGNOSTIC FINDINGS:  MRI completed lumbar but results not in  PATIENT SURVEYS:  Modified Oswestry Modified Oswestry Low Back Pain Disability Questionnaire: 30 / 50 = 60.0 %   COGNITION: Overall  cognitive status: Difficulty to assess due to: baseline not available but wife / MD notes report dementia, hallucinations      SENSATION: WFL  MUSCLE LENGTH: Hamstrings: Right -35 deg; Left -35 deg Thomas test: Right na deg; Left na deg  POSTURE: rounded shoulders, forward head, decreased lumbar lordosis, increased thoracic kyphosis, and posterior pelvic tilt  PALPATION: Non tender with palpation B hips, lumbar region  LUMBAR ROM: unable to assess Lumbar ROM due to balance and poor safety awareness   LOWER EXTREMITY ROM:   wfl B without deficits.   LOWER EXTREMITY MMT:    MMT Right eval Left eval  Hip flexion 4- 4-  Hip extension 4- 4-  Hip abduction 4- 4-  Hip adduction 4- 4-  Hip internal rotation    Hip external rotation    Knee flexion    Knee extension 4- 3+  Ankle dorsiflexion 4 3-  Ankle plantarflexion 3+ 3+  Ankle inversion    Ankle eversion     (Blank rows = not tested)  LUMBAR SPECIAL TESTS:  Straight leg raise test: Positive and Slump test: Positive, both on L  FUNCTIONAL TESTS:  30 seconds chair stand test 8 reps   GAIT: Distance walked: 82' x 2 Assistive device utilized: Single point cane Level of assistance: CGA Comments: very unsteady, scissoring and sway, L foot drop noted  TREATMENT DATE: 04/26/23: evaluation, strongly advised pt to utilize the front wheeled walker at home due to his high fall risk Inst in ankle pumps L with black t band                                                                                                                                PATIENT EDUCATION:  Education details: POC< goals,  Person educated: Patient and Spouse Education method: Explanation, Demonstration, Tactile cues, Verbal cues, and Handouts Education comprehension: verbalized understanding, returned demonstration, verbal cues required, tactile cues required, and needs further education  HOME EXERCISE PROGRAM: TBD  ASSESSMENT:  CLINICAL  IMPRESSION: Patient is a 78 y.o. male  who was evaluated today by physical therapy due to L lumbar radiculopathy. He also has h/o progressive dementia and frequent falls.  Noted marked weakness and atrophy B LE's , most L lower leg with pronounced foot drop.  Emphasized to him today the need to utilize the FWW that he has available at home, not his cane due to his unsteady gait today.  He should benefit from skilled PT to address his gross LE weakness as well as L LE weakness, which in turn should improve  his activity tolerance and reduce his falls and lower back/ radicular pain.  Of note he has melanoma R plantar foot which his wife reports will be surgically removed in 2 weeks which may affect his ability to participate in PT.  Also they are moving in 3 weeks to a new handicap accessible apartment.   OBJECTIVE IMPAIRMENTS: decreased activity tolerance, decreased balance, decreased cognition, decreased coordination, decreased endurance, decreased knowledge of condition, decreased knowledge of use of DME, difficulty walking, decreased strength, decreased safety awareness, impaired perceived functional ability, improper body mechanics, postural dysfunction, and pain.   ACTIVITY LIMITATIONS: carrying, lifting, standing, transfers, reach over head, and locomotion level  PARTICIPATION LIMITATIONS: meal prep, interpersonal relationship, driving, shopping, and community activity  PERSONAL FACTORS: Age, Behavior pattern, Education, Fitness, Past/current experiences, Time since onset of injury/illness/exacerbation, and 1-2 comorbidities: progressive dementia, melanoma R plantar foot to be removed April 30,   are also affecting patient's functional outcome.   REHAB POTENTIAL: Fair high fall risk due to cognitive decline  CLINICAL DECISION MAKING: Evolving/moderate complexity  EVALUATION COMPLEXITY: Moderate   GOALS: Goals reviewed with patient? Yes  SHORT TERM GOALS: Target date: 2 weeks 05/10/23  I  HEP Baseline: Goal status: INITIAL   LONG TERM GOALS: Target date: 06/21/23   Modified Oswestry Low Back Pain Disability Questionnaire: 30 / 50 = 60.0 % improve to 40/50  Baseline:  Goal status: INITIAL  2.  Patient will be able to walk safely greater than 350' with appropriate device.   Baseline: unsafe, high fall risk with st cane Goal status: INITIAL  3.  30 sec sit to stand increase from 8 reps to 12 reps for evidence of improved endurance, coordination, strength Baseline:  Goal status: INITIAL   PLAN:  PT FREQUENCY: 2x/week  PT DURATION: 8 weeks  PLANNED INTERVENTIONS: 97110-Therapeutic exercises, 97530- Therapeutic activity, 97112- Neuromuscular re-education, 97535- Self Care, and 82956- Manual therapy.  PLAN FOR NEXT SESSION: initiate therex in gym on cardiovascular equipment, and progressive strengthening LE's, balance challenges   Lavonne Cass L Samnang Shugars, PT, DPT, OCS  04/27/2023, 1:02 PM   Date of referral: 04/06/23 Referring provider: Reino Bellis Referring diagnosis? L lumbar radiculopathy Treatment diagnosis? (if different than referring diagnosis) same  What was this (referring dx) caused by? Ongoing Issue  Ashby Dawes of Condition: Recurrent (multiple episodes of < 3 months)   Laterality: Both  Current Functional Measure Score: Back Index 30/50  Objective measurements identify impairments when they are compared to normal values, the uninvolved extremity, and prior level of function.  [x]  Yes  []  No  Objective assessment of functional ability: Severe functional limitations   Briefly describe symptoms: chronic pain B lateral lower legs , weakness progressive, constant pain lower back, regardless of position  How did symptoms start: Multiple falls, at least 6 x in last 6 months, weakness and atrophy B LE's with foot drop L  Average pain intensity:  Last 24 hours: not stated  Past week: not stated just reported constant dull pain  How often does the pt  experience symptoms? Constantly  How much have the symptoms interfered with usual daily activities? Extremely  How has condition changed since care began at this facility? NA - initial visit  In general, how is the patients overall health? Fair

## 2023-04-27 ENCOUNTER — Ambulatory Visit: Admitting: Neurology

## 2023-04-28 DIAGNOSIS — H35361 Drusen (degenerative) of macula, right eye: Secondary | ICD-10-CM | POA: Diagnosis not present

## 2023-04-28 DIAGNOSIS — H0288B Meibomian gland dysfunction left eye, upper and lower eyelids: Secondary | ICD-10-CM | POA: Diagnosis not present

## 2023-04-28 DIAGNOSIS — H02831 Dermatochalasis of right upper eyelid: Secondary | ICD-10-CM | POA: Diagnosis not present

## 2023-04-28 DIAGNOSIS — H0288A Meibomian gland dysfunction right eye, upper and lower eyelids: Secondary | ICD-10-CM | POA: Diagnosis not present

## 2023-04-28 DIAGNOSIS — H5203 Hypermetropia, bilateral: Secondary | ICD-10-CM | POA: Diagnosis not present

## 2023-04-28 DIAGNOSIS — H524 Presbyopia: Secondary | ICD-10-CM | POA: Diagnosis not present

## 2023-04-28 DIAGNOSIS — H26493 Other secondary cataract, bilateral: Secondary | ICD-10-CM | POA: Diagnosis not present

## 2023-04-28 DIAGNOSIS — H02834 Dermatochalasis of left upper eyelid: Secondary | ICD-10-CM | POA: Diagnosis not present

## 2023-04-28 DIAGNOSIS — H52203 Unspecified astigmatism, bilateral: Secondary | ICD-10-CM | POA: Diagnosis not present

## 2023-04-28 DIAGNOSIS — H43812 Vitreous degeneration, left eye: Secondary | ICD-10-CM | POA: Diagnosis not present

## 2023-04-28 DIAGNOSIS — Z961 Presence of intraocular lens: Secondary | ICD-10-CM | POA: Diagnosis not present

## 2023-05-05 ENCOUNTER — Ambulatory Visit: Admitting: Physical Therapy

## 2023-05-05 DIAGNOSIS — M6281 Muscle weakness (generalized): Secondary | ICD-10-CM

## 2023-05-05 DIAGNOSIS — M533 Sacrococcygeal disorders, not elsewhere classified: Secondary | ICD-10-CM

## 2023-05-05 DIAGNOSIS — M5459 Other low back pain: Secondary | ICD-10-CM

## 2023-05-05 DIAGNOSIS — G8929 Other chronic pain: Secondary | ICD-10-CM | POA: Diagnosis not present

## 2023-05-05 DIAGNOSIS — M5416 Radiculopathy, lumbar region: Secondary | ICD-10-CM | POA: Diagnosis not present

## 2023-05-05 DIAGNOSIS — M25561 Pain in right knee: Secondary | ICD-10-CM | POA: Diagnosis not present

## 2023-05-05 NOTE — Therapy (Signed)
 OUTPATIENT PHYSICAL THERAPY TREATMENT   Patient Name: David [name] MRN: [id number] DOB:01/04/1946, 78 y.o., male Today's Date: 05/05/2023  END OF SESSION:  PT End of Session - 05/05/23 0847     Visit Number 2    Date for PT Re-Evaluation 06/21/23    Progress Note Due on Visit 10    PT Start Time 0847    PT Stop Time 0925    PT Time Calculation (min) 38 min    Activity Tolerance Patient tolerated treatment well    Behavior During Therapy [facility] for tasks assessed/performed              Past Medical History:  Diagnosis Date   Adenoma of right adrenal gland 07/11/2002   2.4cm , noted on CT ABD   Anxiety    Arthritis of both knees 03/26/2016   Astigmatism    Back pain    BCC (basal cell carcinoma of skin)    under right eye and right ear   Blood transfusion without reported diagnosis    Cataract 09/29/2016   Depression    Diverticulitis 2009   Diverticulosis    Family history of breast cancer    Family history of colon cancer    Fatty liver    GERD (gastroesophageal reflux disease)    Gout    Hearing loss of both ears 03/26/2016   History of atrial fibrillation    x 2 none since 2020   History of chronic prostatitis    started at age 52   History of kidney stones    Hx of adenomatous colonic polyps 02/16/2022   3 diminutive - no recall   Hyperglycemia    Hyperlipidemia    Hypertension    Incomplete right bundle branch block (RBBB)    Internal hemorrhoids    Kidney lesion 06/07/2015   Right midportion, 1.1x1.1 cm hyperechoic, noted on US  ABD   LAFB (left anterior fascicular block)    Lipoma of axilla 09/2016   Liver lesion, right lobe 06/07/2015   2.5x2.4x2.4 cm hypoechoic lesion posterior aspect, noted on US  ABD   Low back pain 03/26/2016   LVH (left ventricular hypertrophy) 08/06/2017   Moderate, Noted on ECHO   Medicare annual wellness visit, subsequent 03/12/2014   OA (osteoarthritis)    Back, Hands, Neck   Obesity 11/25/2007   Qualifier:  Diagnosis of  By: Elinor Guardian MD, Raenelle Bumpers    Other malaise and fatigue 03/13/2013   Premature ventricular complex    Prostate cancer (HCC) dx 2021   Renal insufficiency    Kidney removed right   Scoliosis    Upper thoracic and lumbar   Sinus bradycardia 08/06/2017   Noted on EKG   Vitamin D  deficiency    resolved   Past Surgical History:  Procedure Laterality Date   CARDIOVERSION  07/31/2017   CATARACT EXTRACTION, BILATERAL     COLON SURGERY  2009   segmental sigmoid resection   COLONOSCOPY     COLONOSCOPY WITH PROPOFOL      CYSTOSCOPY N/A 07/28/2019   Procedure: CYSTOSCOPY FLEXIBLE;  Surgeon: Marco Severs, MD;  Location: [facility];  Service: Urology;  Laterality: N/A;   CYSTOSCOPY W/ RETROGRADES Right 06/07/2018   Procedure: CYSTOSCOPY WITH RETROGRADE PYELOGRAM, uphrostogram;  Surgeon: Marco Severs, MD;  Location: [facility];  Service: Urology;  Laterality: Right;   CYSTOSCOPY WITH URETEROSCOPY AND STENT PLACEMENT Right 02/28/2018   Procedure: CYSTOSCOPY WITH URETEROSCOPY Sallyann Crea;  Surgeon: Ottelin, Mark, MD;  Location: [facility];  Service: Urology;  Laterality: Right;   CYSTOSCOPY/URETEROSCOPY/HOLMIUM LASER/STENT PLACEMENT Right 11/29/2017   Procedure: CYSTOSCOPY/RETROGRADE/URETEROSCOPY/HOLMIUM LASER/STENT PLACEMENT;  Surgeon: Ottelin, Mark, MD;  Location: [facility];  Service: Urology;  Laterality: Right;   EYE SURGERY Bilateral 01/12/2017   cataract removal   history of blood tranfusion  age 54   HOLMIUM LASER APPLICATION Right 09/24/2017   Procedure: HOLMIUM LASER APPLICATION;  Surgeon: Ottelin, Mark, MD;  Location: [facility];  Service: Urology;  Laterality: Right;   IR BALLOON DILATION URETERAL STRICTURE RIGHT  03/14/2018   IR NEPHRO TUBE REMOV/FL  03/17/2018   IR NEPHROSTOGRAM RIGHT THRU EXISTING ACCESS  03/17/2018   IR NEPHROSTOMY EXCHANGE RIGHT  03/14/2018   IR NEPHROSTOMY EXCHANGE RIGHT  06/21/2018   IR NEPHROSTOMY  PLACEMENT RIGHT  03/02/2018   IR NEPHROSTOMY PLACEMENT RIGHT  04/20/2018   IR URETERAL STENT PLACEMENT EXISTING ACCESS RIGHT  03/14/2018   NOSE SURGERY     Submucous resection age 2   NUCLEAR STRESS TEST  06/03/2009   RADIOACTIVE SEED IMPLANT N/A 07/28/2019   Procedure: RADIOACTIVE SEED IMPLANT/BRACHYTHERAPY IMPLANT;  Surgeon: Marco Severs, MD;  Location: [facility];  Service: Urology;  Laterality: N/A;  90 MINS   ROBOT ASSISTED LAPAROSCOPIC NEPHRECTOMY Right 08/04/2018   Procedure: XI ROBOTIC ASSISTED LAPAROSCOPIC NEPHRECTOMY;  Surgeon: Marco Severs, MD;  Location: [facility];  Service: Urology;  Laterality: Right;  3 HRS   ROBOT ASSISTED PYELOPLASTY Right 06/07/2018   Procedure: attempted XI ROBOTIC ASSISTED PYELOPLASTY, lysis of adhesions;  Surgeon: Marco Severs, MD;  Location: [facility];  Service: Urology;  Laterality: Right;  3 HRS   SKIN BIOPSY     SPACE OAR INSTILLATION N/A 07/28/2019   Procedure: SPACE OAR INSTILLATION;  Surgeon: Marco Severs, MD;  Location[facility];  Service: Urology;  Laterality: N/A;   URETEROSCOPY WITH HOLMIUM LASER LITHOTRIPSY Right 09/24/2017   Procedure: CYSTOSCOPY, URETEROSCOPY WITH HOLMIUM LASER LITHOTRIPSY, STENT PLACEMENT;  Surgeon: Ottelin, Mark, MD;  Location: [facility];  Service: Urology;  Laterality: Right;   Patient Active Problem List   Diagnosis Date Noted   Gait abnormality 04/22/2023   Aortic root dilatation (HCC) 03/31/2023   Sacral pain 12/08/2022   Right shoulder pain 09/30/2022   Moderate dementia with mood disturbance (HCC) 07/12/2022   Chronic right ear pain 03/11/2022   Chronic anticoagulation 02/11/2022   CRI (chronic renal insufficiency) 10/29/2021   Abdominal pain 10/29/2021   Sun-damaged skin 07/23/2021   H/O prostate cancer 07/23/2021   Left knee pain 07/23/2021   Coronary atherosclerosis 02/11/2021   Myalgia 11/04/2020   Neck pain 11/04/2020   Stage 3a  chronic kidney disease (HCC) 07/02/2020   Urinary urgency 02/12/2020   Colon cancer screening 09/26/2019   Family history of breast cancer    Family history of colon cancer    Educated about COVID-19 virus infection 08/26/2019   Prostate cancer (HCC) 04/28/2019   Pruritus 02/03/2019   LVH (left ventricular hypertrophy) 10/30/2018   Tinea versicolor 09/28/2018   Thoracic back pain 09/26/2018   Hypercalcemia 08/08/2018   Nonfunctioning kidney 08/04/2018   Arm pain 07/18/2018   Right hip pain 06/22/2018   Ureteral obstruction 06/07/2018   Recurrent nephrolithiasis 04/23/2018   Hydronephrosis 03/01/2018   Floppy eyelid syndrome of both eyes 01/24/2018   Foot pain, bilateral 12/28/2017   Kidney stone 09/18/2017   Insomnia 09/17/2017   Abnormal TSH 04/15/2017   Senile nuclear sclerosis 01/18/2017   Cataract  09/29/2016   Muscle cramp 09/29/2016   Nocturia 09/29/2016   Hyperglycemia 03/29/2016   Low back pain with radiation 03/26/2016   Cataracts, bilateral 03/26/2016   Arthritis of both knees 03/26/2016   Hearing loss 03/26/2016   Complex renal cyst 06/16/2015   History of atrial fibrillation 06/16/2015   Snoring 06/07/2015   Somnolence 06/07/2015   Preventative health care 03/07/2015   Lipoma of axilla 06/04/2014   Tinea cruris 03/20/2014   Lichen simplex chronicus 03/20/2014   Medicare annual wellness visit, subsequent 03/12/2014   Depression with anxiety 09/25/2013   Constipation 09/25/2013   Gouty arthritis of toe of left foot 08/01/2013   Skin lesion 05/27/2013   Rash 05/26/2013   Abnormal liver function 03/18/2013   IBS (irritable bowel syndrome) 03/18/2013   Other malaise and fatigue 03/13/2013   Tinea corporis 03/13/2013   Gout 12/14/2012   Diarrhea 12/14/2012   Rectal irritation 09/18/2012   Diverticulosis    BCC (basal cell carcinoma of skin)    Scoliosis 01/19/2011   Hyperlipidemia, mixed 03/11/2010   Essential hypertension, benign 03/11/2010   Allergic  rhinitis 03/11/2010   Arthralgia 03/11/2010   Obesity 11/25/2007   BENIGN NEOPLASM OF ADRENAL GLAND 09/22/2007   Fatty liver disease, nonalcoholic 09/22/2007   Osteoarthritis 09/22/2007   SPONDYLOSIS, LUMBAR 09/22/2007   GERD 04/12/2007    PCP: Neda Balk., MD  REFERRING PROVIDER: Raydell Cahill DO  REFERRING DIAG: L lumbar radiculopathy  Rationale for Evaluation and Treatment: Rehabilitation  THERAPY DIAG:  Other low back pain  Muscle weakness (generalized)  Left lumbar radiculopathy  SI (sacroiliac) pain  Chronic pain of right knee  ONSET DATE: chronic  SUBJECTIVE:                                                                                                                                                                                           SUBJECTIVE STATEMENT: Pt states he was moving things for his wife yesterday and then fell. States he fell on his L shoulder but his back is feeling more sore than the shoulder. Pt reports he is using the walker but brought in the quad cane today.   PERTINENT HISTORY:  HOH, Referred by sports medicine MD,   PAIN:  Are you having pain? 2-3/10 mostly in the back and L side  PRECAUTIONS: Fall  RED FLAGS: None   WEIGHT BEARING RESTRICTIONS: No  FALLS:  Has patient fallen in last 6 months? Yes. Number of falls 7  LIVING ENVIRONMENT: Lives with: lives with their family and lives with their spouse Lives in: House/apartment Stairs:  moving to an apartment with wife in  2 weeks that will be one level Has following equipment at home: [employer] - 2 wheeled and [employer] - 4 wheeled  OCCUPATION: retired  PLOF: Independent  PATIENT GOALS: pt /wife want to improve pain  NEXT MD VISIT: 06/24/23  OBJECTIVE:  Note: Objective measures were completed at Evaluation unless otherwise noted.  DIAGNOSTIC FINDINGS:  MRI completed lumbar but results not in  PATIENT SURVEYS:  Modified Oswestry Modified Oswestry Low Back Pain  Disability Questionnaire: 30 / 50 = 60.0 %   COGNITION: Overall cognitive status: Difficulty to assess due to: baseline not available but wife / MD notes report dementia, hallucinations      SENSATION: WFL  MUSCLE LENGTH: Hamstrings: Right -35 deg; Left -35 deg Thomas test: Right na deg; Left na deg  POSTURE: rounded shoulders, forward head, decreased lumbar lordosis, increased thoracic kyphosis, and posterior pelvic tilt  PALPATION: Non tender with palpation B hips, lumbar region  LUMBAR ROM: unable to assess Lumbar ROM due to balance and poor safety awareness   LOWER EXTREMITY ROM:   wfl B without deficits.   LOWER EXTREMITY MMT:    MMT Right eval Left eval  Hip flexion 4- 4-  Hip extension 4- 4-  Hip abduction 4- 4-  Hip adduction 4- 4-  Hip internal rotation    Hip external rotation    Knee flexion    Knee extension 4- 3+  Ankle dorsiflexion 4 3-  Ankle plantarflexion 3+ 3+  Ankle inversion    Ankle eversion     (Blank rows = not tested)  LUMBAR SPECIAL TESTS:  Straight leg raise test: Positive and Slump test: Positive, both on L  FUNCTIONAL TESTS:  30 seconds chair stand test 8 reps   GAIT: Distance walked: 53' x 2 Assistive device utilized: Single point cane Level of assistance: CGA Comments: very unsteady, scissoring and sway, L foot drop noted  TREATMENT DATE:  05/05/23 Nustep L5 x 6 min UEs/LEs Seated hamstring stretch x30" Seated figure 4 stretch x30" Seated pball roll out x30", with lateral flexion x30" Seated ab set pball 10x5" Standing heel/toe raise 2x10 Standing hip abduction 1x10 Standing hip ext 1x10 Standing marching 1x10  04/26/23: evaluation, strongly advised pt to utilize the front wheeled walker at home due to his high fall risk Inst in ankle pumps L with black t band                                                                                                                                PATIENT EDUCATION:  Education  details: POC, goals,  Person educated: Patient and Spouse Education method: Explanation, Demonstration, Tactile cues, Verbal cues, and Handouts Education comprehension: verbalized understanding, returned demonstration, verbal cues required, tactile cues required, and needs further education  HOME EXERCISE PROGRAM: Access Code: [email or web address] URL: [email or web address] Date: 05/05/2023 Prepared by: Colleene Swarthout April Marie Wreatha Sturgeon  Exercises - Seated Hamstring Stretch  - 1 x daily - 7 x weekly -  2 sets - 30 sec hold - Seated Figure 4 Piriformis Stretch  - 1 x daily - 7 x weekly - 2 sets - 30 sec hold - Heel Toe Raises with Counter Support  - 1 x daily - 7 x weekly - 2 sets - 10 reps - Standing Hip Abduction with Counter Support  - 1 x daily - 7 x weekly - 2-3 sets - 10 reps - Standing Hip Extension with Counter Support  - 1 x daily - 7 x weekly - 2-3 sets - 10 reps - Standing Hip Flexion with Counter Support  - 1 x daily - 7 x weekly - 2-3 sets - 10 reps  ASSESSMENT:  CLINICAL IMPRESSION: Pt requires min to CGA for safety with standing exercises (especially when having to stabilize on just L LE). Session focused on initiating stretching and strengthening HEP to improve pt's overall mobility, endurance, and stability. Less coordination and strength noted with L LE and some buckling.   From eval: Patient is a 78 y.o. male  who was evaluated today by physical therapy due to L lumbar radiculopathy. He also has h/o progressive dementia and frequent falls.  Noted marked weakness and atrophy B LE's , most L lower leg with pronounced foot drop.  Emphasized to him today the need to utilize the FWW that he has available at home, not his cane due to his unsteady gait today.  He should benefit from skilled PT to address his gross LE weakness as well as L LE weakness, which in turn should improve his activity tolerance and reduce his falls and lower back/ radicular pain.  Of note he has melanoma R plantar  foot which his wife reports will be surgically removed in 2 weeks which may affect his ability to participate in PT.  Also they are moving in 3 weeks to a new handicap accessible apartment.   OBJECTIVE IMPAIRMENTS: decreased activity tolerance, decreased balance, decreased cognition, decreased coordination, decreased endurance, decreased knowledge of condition, decreased knowledge of use of DME, difficulty walking, decreased strength, decreased safety awareness, impaired perceived functional ability, improper body mechanics, postural dysfunction, and pain.   ACTIVITY LIMITATIONS: carrying, lifting, standing, transfers, reach over head, and locomotion level  PARTICIPATION LIMITATIONS: meal prep, interpersonal relationship, driving, shopping, and community activity  PERSONAL FACTORS: Age, Behavior pattern, Education, Fitness, Past/current experiences, Time since onset of injury/illness/exacerbation, and 1-2 comorbidities: progressive dementia, melanoma R plantar foot to be removed April 30,   are also affecting patient's functional outcome.   REHAB POTENTIAL: Fair high fall risk due to cognitive decline  CLINICAL DECISION MAKING: Evolving/moderate complexity  EVALUATION COMPLEXITY: Moderate   GOALS: Goals reviewed with patient? Yes  SHORT TERM GOALS: Target date: 2 weeks 05/10/23  I HEP Baseline: Goal status: INITIAL   LONG TERM GOALS: Target date: 06/21/23   Modified Oswestry Low Back Pain Disability Questionnaire: 30 / 50 = 60.0 % improve to 40/50  Baseline:  Goal status: INITIAL  2.  Patient will be able to walk safely greater than 350' with appropriate device.   Baseline: unsafe, high fall risk with st cane Goal status: INITIAL  3.  30 sec sit to stand increase from 8 reps to 12 reps for evidence of improved endurance, coordination, strength Baseline:  Goal status: INITIAL   PLAN:  PT FREQUENCY: 2x/week  PT DURATION: 8 weeks  PLANNED INTERVENTIONS: 97110-Therapeutic  exercises, 97530- Therapeutic activity, 97112- Neuromuscular re-education, 97535- Self Care, and 10272- Manual therapy.  PLAN FOR NEXT SESSION: initiate therex  in gym on cardiovascular equipment, and progressive strengthening LE's, balance challenges   Britiny Defrain April Ma L Damone Fancher, PT, DPT  05/05/2023, 8:53 AM   Date of referral: 04/06/23 Referring provider: Raydell Cahill Referring diagnosis? L lumbar radiculopathy Treatment diagnosis? (if different than referring diagnosis) same  What was this (referring dx) caused by? Ongoing Issue  Lonne Roan of Condition: Recurrent (multiple episodes of < 3 months)   Laterality: Both  Current Functional Measure Score: Back Index 30/50  Objective measurements identify impairments when they are compared to normal values, the uninvolved extremity, and prior level of function.  [x]  Yes  []  No  Objective assessment of functional ability: Severe functional limitations   Briefly describe symptoms: chronic pain B lateral lower legs , weakness progressive, constant pain lower back, regardless of position  How did symptoms start: Multiple falls, at least 6 x in last 6 months, weakness and atrophy B LE's with foot drop L  Average pain intensity:  Last 24 hours: not stated  Past week: not stated just reported constant dull pain  How often does the pt experience symptoms? Constantly  How much have the symptoms interfered with usual daily activities? Extremely  How has condition changed since care began at this facility? NA - initial visit  In general, how is the patients overall health? Fair

## 2023-05-07 ENCOUNTER — Ambulatory Visit: Admitting: Physical Therapy

## 2023-05-07 ENCOUNTER — Encounter: Payer: Self-pay | Admitting: Physical Therapy

## 2023-05-07 DIAGNOSIS — G8929 Other chronic pain: Secondary | ICD-10-CM | POA: Diagnosis not present

## 2023-05-07 DIAGNOSIS — M5416 Radiculopathy, lumbar region: Secondary | ICD-10-CM | POA: Diagnosis not present

## 2023-05-07 DIAGNOSIS — M6281 Muscle weakness (generalized): Secondary | ICD-10-CM | POA: Diagnosis not present

## 2023-05-07 DIAGNOSIS — M5459 Other low back pain: Secondary | ICD-10-CM

## 2023-05-07 DIAGNOSIS — M533 Sacrococcygeal disorders, not elsewhere classified: Secondary | ICD-10-CM | POA: Diagnosis not present

## 2023-05-07 DIAGNOSIS — M25561 Pain in right knee: Secondary | ICD-10-CM | POA: Diagnosis not present

## 2023-05-07 NOTE — Therapy (Signed)
 OUTPATIENT PHYSICAL THERAPY TREATMENT   Patient Name: CHANC KERVIN MRN: [id number] DOB:March 01, 1945, 78 y.o., male Today's Date: 05/07/2023  END OF SESSION:  PT End of Session - 05/07/23 0845     Visit Number 3    Date for PT Re-Evaluation 06/21/23    Authorization Type [employer] Medicare    Authorization Time Period 04/26/23 - 06/21/23    Authorization - Visit Number 3    Authorization - Number of Visits 16    Progress Note Due on Visit 10    PT Start Time 0845    PT Stop Time 0927    PT Time Calculation (min) 42 min    Activity Tolerance Patient tolerated treatment well    Behavior During Therapy [facility] for tasks assessed/performed               Past Medical History:  Diagnosis Date   Adenoma of right adrenal gland 07/11/2002   2.4cm , noted on CT ABD   Anxiety    Arthritis of both knees 03/26/2016   Astigmatism    Back pain    BCC (basal cell carcinoma of skin)    under right eye and right ear   Blood transfusion without reported diagnosis    Cataract 09/29/2016   Depression    Diverticulitis 2009   Diverticulosis    Family history of breast cancer    Family history of colon cancer    Fatty liver    GERD (gastroesophageal reflux disease)    Gout    Hearing loss of both ears 03/26/2016   History of atrial fibrillation    x 2 none since 2020   History of chronic prostatitis    started at age 27   History of kidney stones    Hx of adenomatous colonic polyps 02/16/2022   3 diminutive - no recall   Hyperglycemia    Hyperlipidemia    Hypertension    Incomplete right bundle branch block (RBBB)    Internal hemorrhoids    Kidney lesion 06/07/2015   Right midportion, 1.1x1.1 cm hyperechoic, noted on US  ABD   LAFB (left anterior fascicular block)    Lipoma of axilla 09/2016   Liver lesion, right lobe 06/07/2015   2.5x2.4x2.4 cm hypoechoic lesion posterior aspect, noted on US  ABD   Low back pain 03/26/2016   LVH (left ventricular hypertrophy) 08/06/2017    Moderate, Noted on ECHO   Medicare annual wellness visit, subsequent 03/12/2014   OA (osteoarthritis)    Back, Hands, Neck   Obesity 11/25/2007   Qualifier: Diagnosis of  By: Elinor Guardian MD, Raenelle Bumpers    Other malaise and fatigue 03/13/2013   Premature ventricular complex    Prostate cancer (HCC) dx 2021   Renal insufficiency    Kidney removed right   Scoliosis    Upper thoracic and lumbar   Sinus bradycardia 08/06/2017   Noted on EKG   Vitamin D  deficiency    resolved   Past Surgical History:  Procedure Laterality Date   CARDIOVERSION  07/31/2017   CATARACT EXTRACTION, BILATERAL     COLON SURGERY  2009   segmental sigmoid resection   COLONOSCOPY     COLONOSCOPY WITH PROPOFOL      CYSTOSCOPY N/A 07/28/2019   Procedure: CYSTOSCOPY FLEXIBLE;  Surgeon: Marco Severs, MD;  Location: [facility];  Service: Urology;  Laterality: N/A;   CYSTOSCOPY W/ RETROGRADES Right 06/07/2018   Procedure: CYSTOSCOPY WITH RETROGRADE PYELOGRAM, uphrostogram;  Surgeon: Marco Severs, MD;  Location:  [facility];  Service: Urology;  Laterality: Right;   CYSTOSCOPY WITH URETEROSCOPY AND STENT PLACEMENT Right 02/28/2018   Procedure: CYSTOSCOPY WITH URETEROSCOPY Sallyann Crea;  Surgeon: Ottelin, Mark, MD;  Location: [facility];  Service: Urology;  Laterality: Right;   CYSTOSCOPY/URETEROSCOPY/HOLMIUM LASER/STENT PLACEMENT Right 11/29/2017   Procedure: CYSTOSCOPY/RETROGRADE/URETEROSCOPY/HOLMIUM LASER/STENT PLACEMENT;  Surgeon: Ottelin, Mark, MD;  Location: [facility];  Service: Urology;  Laterality: Right;   EYE SURGERY Bilateral 01/12/2017   cataract removal   history of blood tranfusion  age 11   HOLMIUM LASER APPLICATION Right 09/24/2017   Procedure: HOLMIUM LASER APPLICATION;  Surgeon: Ottelin, Mark, MD;  Location: [facility];  Service: Urology;  Laterality: Right;   IR BALLOON DILATION URETERAL STRICTURE RIGHT  03/14/2018   IR NEPHRO TUBE REMOV/FL  03/17/2018    IR NEPHROSTOGRAM RIGHT THRU EXISTING ACCESS  03/17/2018   IR NEPHROSTOMY EXCHANGE RIGHT  03/14/2018   IR NEPHROSTOMY EXCHANGE RIGHT  06/21/2018   IR NEPHROSTOMY PLACEMENT RIGHT  03/02/2018   IR NEPHROSTOMY PLACEMENT RIGHT  04/20/2018   IR URETERAL STENT PLACEMENT EXISTING ACCESS RIGHT  03/14/2018   NOSE SURGERY     Submucous resection age 66   NUCLEAR STRESS TEST  06/03/2009   RADIOACTIVE SEED IMPLANT N/A 07/28/2019   Procedure: RADIOACTIVE SEED IMPLANT/BRACHYTHERAPY IMPLANT;  Surgeon: Marco Severs, MD;  Location: [facility];  Service: Urology;  Laterality: N/A;  90 MINS   ROBOT ASSISTED LAPAROSCOPIC NEPHRECTOMY Right 08/04/2018   Procedure: XI ROBOTIC ASSISTED LAPAROSCOPIC NEPHRECTOMY;  Surgeon: Marco Severs, MD;  Location: [facility];  Service: Urology;  Laterality: Right;  3 HRS   ROBOT ASSISTED PYELOPLASTY Right 06/07/2018   Procedure: attempted XI ROBOTIC ASSISTED PYELOPLASTY, lysis of adhesions;  Surgeon: Marco Severs, MD;  Location: [facility];  Service: Urology;  Laterality: Right;  3 HRS   SKIN BIOPSY     SPACE OAR INSTILLATION N/A 07/28/2019   Procedure: SPACE OAR INSTILLATION;  Surgeon: Marco Severs, MD;  Location: [facility];  Service: Urology;  Laterality: N/A;   URETEROSCOPY WITH HOLMIUM LASER LITHOTRIPSY Right 09/24/2017   Procedure: CYSTOSCOPY, URETEROSCOPY WITH HOLMIUM LASER LITHOTRIPSY, STENT PLACEMENT;  Surgeon: Ottelin, Mark, MD;  Location[facility];  Service: Urology;  Laterality: Right;   Patient Active Problem List   Diagnosis Date Noted   Gait abnormality 04/22/2023   Aortic root dilatation (HCC) 03/31/2023   Sacral pain 12/08/2022   Right shoulder pain 09/30/2022   Moderate dementia with mood disturbance (HCC) 07/12/2022   Chronic right ear pain 03/11/2022   Chronic anticoagulation 02/11/2022   CRI (chronic renal insufficiency) 10/29/2021   Abdominal pain 10/29/2021   Sun-damaged skin  07/23/2021   H/O prostate cancer 07/23/2021   Left knee pain 07/23/2021   Coronary atherosclerosis 02/11/2021   Myalgia 11/04/2020   Neck pain 11/04/2020   Stage 3a chronic kidney disease (HCC) 07/02/2020   Urinary urgency 02/12/2020   Colon cancer screening 09/26/2019   Family history of breast cancer    Family history of colon cancer    Educated about COVID-19 virus infection 08/26/2019   Prostate cancer (HCC) 04/28/2019   Pruritus 02/03/2019   LVH (left ventricular hypertrophy) 10/30/2018   Tinea versicolor 09/28/2018   Thoracic back pain 09/26/2018   Hypercalcemia 08/08/2018   Nonfunctioning kidney 08/04/2018   Arm pain 07/18/2018   Right hip pain 06/22/2018   Ureteral obstruction 06/07/2018   Recurrent nephrolithiasis 04/23/2018   Hydronephrosis 03/01/2018   Floppy eyelid  syndrome of both eyes 01/24/2018   Foot pain, bilateral 12/28/2017   Kidney stone 09/18/2017   Insomnia 09/17/2017   Abnormal TSH 04/15/2017   Senile nuclear sclerosis 01/18/2017   Cataract 09/29/2016   Muscle cramp 09/29/2016   Nocturia 09/29/2016   Hyperglycemia 03/29/2016   Low back pain with radiation 03/26/2016   Cataracts, bilateral 03/26/2016   Arthritis of both knees 03/26/2016   Hearing loss 03/26/2016   Complex renal cyst 06/16/2015   History of atrial fibrillation 06/16/2015   Snoring 06/07/2015   Somnolence 06/07/2015   Preventative health care 03/07/2015   Lipoma of axilla 06/04/2014   Tinea cruris 03/20/2014   Lichen simplex chronicus 03/20/2014   Medicare annual wellness visit, subsequent 03/12/2014   Depression with anxiety 09/25/2013   Constipation 09/25/2013   Gouty arthritis of toe of left foot 08/01/2013   Skin lesion 05/27/2013   Rash 05/26/2013   Abnormal liver function 03/18/2013   IBS (irritable bowel syndrome) 03/18/2013   Other malaise and fatigue 03/13/2013   Tinea corporis 03/13/2013   Gout 12/14/2012   Diarrhea 12/14/2012   Rectal irritation 09/18/2012    Diverticulosis    BCC (basal cell carcinoma of skin)    Scoliosis 01/19/2011   Hyperlipidemia, mixed 03/11/2010   Essential hypertension, benign 03/11/2010   Allergic rhinitis 03/11/2010   Arthralgia 03/11/2010   Obesity 11/25/2007   BENIGN NEOPLASM OF ADRENAL GLAND 09/22/2007   Fatty liver disease, nonalcoholic 09/22/2007   Osteoarthritis 09/22/2007   SPONDYLOSIS, LUMBAR 09/22/2007   GERD 04/12/2007    PCP: Neda Balk., MD  REFERRING PROVIDER: Raydell Cahill DO  REFERRING DIAG: L lumbar radiculopathy  THERAPY DIAG:  Other low back pain  Muscle weakness (generalized)  Left lumbar radiculopathy  RATIONALE FOR EVALUATION AND TREATMENT: Rehabilitation  ONSET DATE: chronic  NEXT MD VISIT: 06/24/23   SUBJECTIVE:                                                                                                                                                                                           SUBJECTIVE STATEMENT: Pt reports his inside L leg feels tight today but denies pain. More stiff than pain in back.  Still using the quad cane rather than the RW as recommended on his PT eval.  PERTINENT HISTORY:  HOH, Referred by sports medicine MD,   PAIN:  Are you having pain? No   PRECAUTIONS: Fall  RED FLAGS: None   WEIGHT BEARING RESTRICTIONS: No  FALLS:  Has patient fallen in last 6 months? Yes. Number of falls 7  LIVING ENVIRONMENT: Lives with: lives with their family and lives with their  spouse Lives in: House/apartment Stairs:  moving to an apartment with wife in 2 weeks that will be one level Has following equipment at home: [employer] - 2 wheeled and [employer] - 4 wheeled  OCCUPATION: retired  PLOF: Independent  PATIENT GOALS: pt /wife want to improve pain   OBJECTIVE:  Note: Objective measures were completed at Evaluation unless otherwise noted.  DIAGNOSTIC FINDINGS:  MRI completed lumbar but results not in  PATIENT SURVEYS:  Modified Oswestry  Modified Oswestry Low Back Pain Disability Questionnaire: 30 / 50 = 60.0 %   COGNITION: Overall cognitive status: Difficulty to assess due to: baseline not available but wife / MD notes report dementia, hallucinations      SENSATION: WFL  MUSCLE LENGTH: Hamstrings: Right -35 deg; Left -35 deg Thomas test: Right na deg; Left na deg  POSTURE: rounded shoulders, forward head, decreased lumbar lordosis, increased thoracic kyphosis, and posterior pelvic tilt  PALPATION: Non tender with palpation B hips, lumbar region  LUMBAR ROM: unable to assess Lumbar ROM due to balance and poor safety awareness   LOWER EXTREMITY ROM:   wfl B without deficits.   LOWER EXTREMITY MMT:    MMT Right eval Left eval  Hip flexion 4- 4-  Hip extension 4- 4-  Hip abduction 4- 4-  Hip adduction 4- 4-  Hip internal rotation    Hip external rotation    Knee flexion    Knee extension 4- 3+  Ankle dorsiflexion 4 3-  Ankle plantarflexion 3+ 3+  Ankle inversion    Ankle eversion     (Blank rows = not tested)  LUMBAR SPECIAL TESTS:  Straight leg raise test: Positive and Slump test: Positive, both on L  FUNCTIONAL TESTS:  30 seconds chair stand test 8 reps   GAIT: Distance walked: 75' x 2 Assistive device utilized: Single point cane Level of assistance: CGA Comments: very unsteady, scissoring and sway, L foot drop noted  TREATMENT DATE:   05/07/23 THERAPEUTIC EXERCISE: To improve strength, endurance, ROM, and flexibility.  Demonstration, verbal and tactile cues throughout for technique.  NuStep - L5 x 6 min - UEs/LEs Seated hip hinge HS stretch x 30" bil Seated figure-4 hip piriformis stretch x 30" bil Standing heel-toe raises 2 x 10 - limited toe lift on L Standing L gastroc stretch x 30" Attempted YTB resisted L DF in sitting x 5 but minimal toe lift  NEUROMUSCULAR RE-EDUCATION: To improve balance, coordination, kinesthesia, posture, proprioception, and reduce fall risk. Standing hip ABD  with looped RTB at distal thighs x 10 - cues to maintain upright posture and avoid trunk lean to lift leg Standing hip extension with looped RTB at distal thighs x 10 - cues to maintain upright posture and avoid trunk flexion to lift leg Standing hip flexion march with looped RTB at distal thighs x 10  Standing hip flexion SLR with looped RTB at distal thighs x 10  B side-stepping 2 x 10 feet along with looped RTB at distal thighs B tandem stance with intermittent counter support 2 x 30" SLS with counter support 3 x 10" bil   05/05/23 Nustep L5 x 6 min UEs/LEs Seated hamstring stretch x30" Seated figure 4 stretch x30" Seated pball roll out x30", with lateral flexion x30" Seated ab set pball 10x5" Standing heel/toe raise 2x10 Standing hip abduction 1x10 Standing hip ext 1x10 Standing marching 1x10   04/26/23: evaluation, strongly advised pt to utilize the front wheeled walker at home due to his high fall risk Inst  in ankle pumps L with black Tband                 PATIENT EDUCATION:  Education details: HEP review, HEP update - added tandem & SLS at counter, and HEP progression - added RTB to distal thigh for 3-way hip strengtheing    Person educated: Patient and Spouse Education method: Explanation, Demonstration, Tactile cues, Verbal cues, and Handouts Education comprehension: verbalized understanding, returned demonstration, verbal cues required, tactile cues required, and needs further education  HOME EXERCISE PROGRAM: Access Code: [email or web address] URL: [email or web address] Date: 05/07/2023 Prepared by: Felecia Hopper  Exercises - Seated Hamstring Stretch  - 1 x daily - 7 x weekly - 2 sets - 30 sec hold - Seated Figure 4 Piriformis Stretch  - 1 x daily - 7 x weekly - 2 sets - 30 sec hold - Heel Toe Raises with Counter Support  - 1 x daily - 7 x weekly - 2 sets - 10 reps - Standing Hip Extension with Counter Support  - 1 x daily - 7 x weekly - 2-3 sets - 10 reps -  Standing Hip Flexion with Counter Support  - 1 x daily - 7 x weekly - 2-3 sets - 10 reps - Standing Hip Abduction with Resistance at Thighs  - 1 x daily - 7 x weekly - 2 sets - 10 reps - 3 sec hold - Standing Tandem Balance with Counter Support  - 1 x daily - 7 x weekly - 3 reps - 20-30 sec hold - Standing Single Leg Stance with Counter Support  - 1 x daily - 7 x weekly - 3-5 reps - 10 sec hold   ASSESSMENT:  CLINICAL IMPRESSION: Dontrelle reports he has been using the [facility] rather than the RW recommended by the evaluating PT.  He has been using the [facility] for ~1 week and is feeling more comfortable with it at this time.  He admits to limited performance of the initial HEP but states "they made sense".  HEP reviewed with clarification needed for hold time on stretches as well as avoidance of accessory motion at trunk on standing hip exercises.  Limited L DF noted with heel-toe raises with no difference noted when attempted in sitting and unable to initiate any motion against YTB resistance.  He was able to tolerate addition of RTB resistance with hip exercises with band provided for home use.  Initiated balance activities with counter support for safety with HEP updated accordingly.  Falon will benefit from continued skilled PT to address ongoing strength and balance deficits to improve mobility and activity tolerance with decreased pain interference.   From eval: Patient is a 78 y.o. male  who was evaluated today by physical therapy due to L lumbar radiculopathy. He also has h/o progressive dementia and frequent falls.  Noted marked weakness and atrophy B LE's , most L lower leg with pronounced foot drop.  Emphasized to him today the need to utilize the FWW that he has available at home, not his cane due to his unsteady gait today.  He should benefit from skilled PT to address his gross LE weakness as well as L LE weakness, which in turn should improve his activity tolerance and reduce his falls and  lower back/ radicular pain.  Of note he has melanoma R plantar foot which his wife reports will be surgically removed in 2 weeks which may affect his ability to participate in PT.  Also they are moving in 3 weeks to  a new handicap accessible apartment.   OBJECTIVE IMPAIRMENTS: decreased activity tolerance, decreased balance, decreased cognition, decreased coordination, decreased endurance, decreased knowledge of condition, decreased knowledge of use of DME, difficulty walking, decreased strength, decreased safety awareness, impaired perceived functional ability, improper body mechanics, postural dysfunction, and pain.   ACTIVITY LIMITATIONS: carrying, lifting, standing, transfers, reach over head, and locomotion level  PARTICIPATION LIMITATIONS: meal prep, interpersonal relationship, driving, shopping, and community activity  PERSONAL FACTORS: Age, Behavior pattern, Education, Fitness, Past/current experiences, Time since onset of injury/illness/exacerbation, and 1-2 comorbidities: progressive dementia, melanoma R plantar foot to be removed April 30,   are also affecting patient's functional outcome.   REHAB POTENTIAL: Fair high fall risk due to cognitive decline  CLINICAL DECISION MAKING: Evolving/moderate complexity  EVALUATION COMPLEXITY: Moderate   GOALS: Goals reviewed with patient? Yes  SHORT TERM GOALS: Target date: 2 weeks 05/10/23  I HEP Baseline: Goal status: IN PROGRESS - 05/07/23 - cues necessary for proper technique   LONG TERM GOALS: Target date: 06/21/23   Modified Oswestry Low Back Pain Disability Questionnaire: 30 / 50 = 60.0 % improve to 40/50  Baseline:  Goal status: INITIAL  2.  Patient will be able to walk safely greater than 350' with appropriate device.   Baseline: unsafe, high fall risk with st cane Goal status: INITIAL  3.  30 sec sit to stand increase from 8 reps to 12 reps for evidence of improved endurance, coordination, strength Baseline:  Goal status:  INITIAL   PLAN:  PT FREQUENCY: 2x/week  PT DURATION: 8 weeks  PLANNED INTERVENTIONS: 97110-Therapeutic exercises, 97530- Therapeutic activity, 97112- Neuromuscular re-education, 97535- Self Care, and 57846- Manual therapy.  PLAN FOR NEXT SESSION: initiate therex in gym on cardiovascular equipment, and progressive strengthening LE's, balance challenges   Francisco Irving, PT 05/07/2023, 9:29 AM      Date of referral: 04/06/23 Referring provider: Raydell Cahill Referring diagnosis? L lumbar radiculopathy Treatment diagnosis? (if different than referring diagnosis) same  What was this (referring dx) caused by? Ongoing Issue  Lonne Roan of Condition: Recurrent (multiple episodes of < 3 months)   Laterality: Both  Current Functional Measure Score: Back Index 30/50  Objective measurements identify impairments when they are compared to normal values, the uninvolved extremity, and prior level of function.  [x]  Yes  []  No  Objective assessment of functional ability: Severe functional limitations   Briefly describe symptoms: chronic pain B lateral lower legs , weakness progressive, constant pain lower back, regardless of position  How did symptoms start: Multiple falls, at least 6 x in last 6 months, weakness and atrophy B LE's with foot drop L  Average pain intensity:  Last 24 hours: not stated  Past week: not stated just reported constant dull pain  How often does the pt experience symptoms? Constantly  How much have the symptoms interfered with usual daily activities? Extremely  How has condition changed since care began at this facility? NA - initial visit  In general, how is the patients overall health? Fair

## 2023-05-10 DIAGNOSIS — C4371 Malignant melanoma of right lower limb, including hip: Secondary | ICD-10-CM | POA: Diagnosis not present

## 2023-05-11 ENCOUNTER — Ambulatory Visit

## 2023-05-11 DIAGNOSIS — G8929 Other chronic pain: Secondary | ICD-10-CM | POA: Diagnosis not present

## 2023-05-11 DIAGNOSIS — M533 Sacrococcygeal disorders, not elsewhere classified: Secondary | ICD-10-CM | POA: Diagnosis not present

## 2023-05-11 DIAGNOSIS — M5416 Radiculopathy, lumbar region: Secondary | ICD-10-CM | POA: Diagnosis not present

## 2023-05-11 DIAGNOSIS — M6281 Muscle weakness (generalized): Secondary | ICD-10-CM | POA: Diagnosis not present

## 2023-05-11 DIAGNOSIS — M5459 Other low back pain: Secondary | ICD-10-CM | POA: Diagnosis not present

## 2023-05-11 DIAGNOSIS — M25561 Pain in right knee: Secondary | ICD-10-CM | POA: Diagnosis not present

## 2023-05-11 NOTE — Therapy (Signed)
 OUTPATIENT PHYSICAL THERAPY TREATMENT   Patient Name: David [name] MRN: [id number] DOB:[birth year], 78 y.o., male Today's Date: 05/11/2023  END OF SESSION:  PT End of Session - 05/11/23 1357     Visit Number 4    Date for PT Re-Evaluation 06/21/23    Authorization Type [employer] Medicare    Authorization Time Period 04/26/23 - 06/21/23    Authorization - Visit Number 4    Authorization - Number of Visits 16    Progress Note Due on Visit 10    PT Start Time 1316    PT Stop Time 1357    PT Time Calculation (min) 41 min    Activity Tolerance Patient tolerated treatment well    Behavior During Therapy [facility] for tasks assessed/performed                Past Medical History:  Diagnosis Date   Adenoma of right adrenal gland 07/11/2002   2.4cm , noted on CT ABD   Anxiety    Arthritis of both knees 03/26/2016   Astigmatism    Back pain    BCC (basal cell carcinoma of skin)    under right eye and right ear   Blood transfusion without reported diagnosis    Cataract 09/29/2016   Depression    Diverticulitis 2009   Diverticulosis    Family history of breast cancer    Family history of colon cancer    Fatty liver    GERD (gastroesophageal reflux disease)    Gout    Hearing loss of both ears 03/26/2016   History of atrial fibrillation    x 2 none since 2020   History of chronic prostatitis    started at age 23   History of kidney stones    Hx of adenomatous colonic polyps 02/16/2022   3 diminutive - no recall   Hyperglycemia    Hyperlipidemia    Hypertension    Incomplete right bundle branch block (RBBB)    Internal hemorrhoids    Kidney lesion 06/07/2015   Right midportion, 1.1x1.1 cm hyperechoic, noted on US  ABD   LAFB (left anterior fascicular block)    Lipoma of axilla 09/2016   Liver lesion, right lobe 06/07/2015   2.5x2.4x2.4 cm hypoechoic lesion posterior aspect, noted on US  ABD   Low back pain 03/26/2016   LVH (left ventricular hypertrophy) 08/06/2017    Moderate, Noted on ECHO   Medicare annual wellness visit, subsequent 03/12/2014   OA (osteoarthritis)    Back, Hands, Neck   Obesity 11/25/2007   Qualifier: Diagnosis of  By: Elinor Guardian MD, Raenelle Bumpers    Other malaise and fatigue 03/13/2013   Premature ventricular complex    Prostate cancer (HCC) dx 2021   Renal insufficiency    Kidney removed right   Scoliosis    Upper thoracic and lumbar   Sinus bradycardia 08/06/2017   Noted on EKG   Vitamin D  deficiency    resolved   Past Surgical History:  Procedure Laterality Date   CARDIOVERSION  07/31/2017   CATARACT EXTRACTION, BILATERAL     COLON SURGERY  2009   segmental sigmoid resection   COLONOSCOPY     COLONOSCOPY WITH PROPOFOL      CYSTOSCOPY N/A 07/28/2019   Procedure: CYSTOSCOPY FLEXIBLE;  Surgeon: Marco Severs, MD;  Location: [facility];  Service: Urology;  Laterality: N/A;   CYSTOSCOPY W/ RETROGRADES Right 06/07/2018   Procedure: CYSTOSCOPY WITH RETROGRADE PYELOGRAM, uphrostogram;  Surgeon: Marco Severs, MD;  Location: [facility];  Service: Urology;  Laterality: Right;   CYSTOSCOPY WITH URETEROSCOPY AND STENT PLACEMENT Right 02/28/2018   Procedure: CYSTOSCOPY WITH URETEROSCOPY Sallyann Crea;  Surgeon: Ottelin, Mark, MD;  Location: [facility];  Service: Urology;  Laterality: Right;   CYSTOSCOPY/URETEROSCOPY/HOLMIUM LASER/STENT PLACEMENT Right 11/29/2017   Procedure: CYSTOSCOPY/RETROGRADE/URETEROSCOPY/HOLMIUM LASER/STENT PLACEMENT;  Surgeon: Ottelin, Mark, MD;  Location: [facility];  Service: Urology;  Laterality: Right;   EYE SURGERY Bilateral 01/12/2017   cataract removal   history of blood tranfusion  age 45   HOLMIUM LASER APPLICATION Right 09/24/2017   Procedure: HOLMIUM LASER APPLICATION;  Surgeon: Ottelin, Mark, MD;  Location: [facility];  Service: Urology;  Laterality: Right;   IR BALLOON DILATION URETERAL STRICTURE RIGHT  03/14/2018   IR NEPHRO TUBE REMOV/FL  03/17/2018    IR NEPHROSTOGRAM RIGHT THRU EXISTING ACCESS  03/17/2018   IR NEPHROSTOMY EXCHANGE RIGHT  03/14/2018   IR NEPHROSTOMY EXCHANGE RIGHT  06/21/2018   IR NEPHROSTOMY PLACEMENT RIGHT  03/02/2018   IR NEPHROSTOMY PLACEMENT RIGHT  04/20/2018   IR URETERAL STENT PLACEMENT EXISTING ACCESS RIGHT  03/14/2018   NOSE SURGERY     Submucous resection age 2   NUCLEAR STRESS TEST  06/03/2009   RADIOACTIVE SEED IMPLANT N/A 07/28/2019   Procedure: RADIOACTIVE SEED IMPLANT/BRACHYTHERAPY IMPLANT;  Surgeon: Marco Severs, MD;  Location: [facility];  Service: Urology;  Laterality: N/A;  90 MINS   ROBOT ASSISTED LAPAROSCOPIC NEPHRECTOMY Right 08/04/2018   Procedure: XI ROBOTIC ASSISTED LAPAROSCOPIC NEPHRECTOMY;  Surgeon: Marco Severs, MD;  Location: [facility];  Service: Urology;  Laterality: Right;  3 HRS   ROBOT ASSISTED PYELOPLASTY Right 06/07/2018   Procedure: attempted XI ROBOTIC ASSISTED PYELOPLASTY, lysis of adhesions;  Surgeon: Marco Severs, MD;  Location: [facility];  Service: Urology;  Laterality: Right;  3 HRS   SKIN BIOPSY     SPACE OAR INSTILLATION N/A 07/28/2019   Procedure: SPACE OAR INSTILLATION;  Surgeon: Marco Severs, MD;  Location: [facility];  Service: Urology;  Laterality: N/A;   URETEROSCOPY WITH HOLMIUM LASER LITHOTRIPSY Right 09/24/2017   Procedure: CYSTOSCOPY, URETEROSCOPY WITH HOLMIUM LASER LITHOTRIPSY, STENT PLACEMENT;  Surgeon: Ottelin, Mark, MD;  Location: [facility];  Service: Urology;  Laterality: Right;   Patient Active Problem List   Diagnosis Date Noted   Gait abnormality 04/22/2023   Aortic root dilatation (HCC) 03/31/2023   Sacral pain 12/08/2022   Right shoulder pain 09/30/2022   Moderate dementia with mood disturbance (HCC) 07/12/2022   Chronic right ear pain 03/11/2022   Chronic anticoagulation 02/11/2022   CRI (chronic renal insufficiency) 10/29/2021   Abdominal pain 10/29/2021   Sun-damaged skin  07/23/2021   H/O prostate cancer 07/23/2021   Left knee pain 07/23/2021   Coronary atherosclerosis 02/11/2021   Myalgia 11/04/2020   Neck pain 11/04/2020   Stage 3a chronic kidney disease (HCC) 07/02/2020   Urinary urgency 02/12/2020   Colon cancer screening 09/26/2019   Family history of breast cancer    Family history of colon cancer    Educated about COVID-19 virus infection 08/26/2019   Prostate cancer (HCC) 04/28/2019   Pruritus 02/03/2019   LVH (left ventricular hypertrophy) 10/30/2018   Tinea versicolor 09/28/2018   Thoracic back pain 09/26/2018   Hypercalcemia 08/08/2018   Nonfunctioning kidney 08/04/2018   Arm pain 07/18/2018   Right hip pain 06/22/2018   Ureteral obstruction 06/07/2018   Recurrent nephrolithiasis 04/23/2018   Hydronephrosis 03/01/2018   Floppy  eyelid syndrome of both eyes 01/24/2018   Foot pain, bilateral 12/28/2017   Kidney stone 09/18/2017   Insomnia 09/17/2017   Abnormal TSH 04/15/2017   Senile nuclear sclerosis 01/18/2017   Cataract 09/29/2016   Muscle cramp 09/29/2016   Nocturia 09/29/2016   Hyperglycemia 03/29/2016   Low back pain with radiation 03/26/2016   Cataracts, bilateral 03/26/2016   Arthritis of both knees 03/26/2016   Hearing loss 03/26/2016   Complex renal cyst 06/16/2015   History of atrial fibrillation 06/16/2015   Snoring 06/07/2015   Somnolence 06/07/2015   Preventative health care 03/07/2015   Lipoma of axilla 06/04/2014   Tinea cruris 03/20/2014   Lichen simplex chronicus 03/20/2014   Medicare annual wellness visit, subsequent 03/12/2014   Depression with anxiety 09/25/2013   Constipation 09/25/2013   Gouty arthritis of toe of left foot 08/01/2013   Skin lesion 05/27/2013   Rash 05/26/2013   Abnormal liver function 03/18/2013   IBS (irritable bowel syndrome) 03/18/2013   Other malaise and fatigue 03/13/2013   Tinea corporis 03/13/2013   Gout 12/14/2012   Diarrhea 12/14/2012   Rectal irritation 09/18/2012    Diverticulosis    BCC (basal cell carcinoma of skin)    Scoliosis 01/19/2011   Hyperlipidemia, mixed 03/11/2010   Essential hypertension, benign 03/11/2010   Allergic rhinitis 03/11/2010   Arthralgia 03/11/2010   Obesity 11/25/2007   BENIGN NEOPLASM OF ADRENAL GLAND 09/22/2007   Fatty liver disease, nonalcoholic 09/22/2007   Osteoarthritis 09/22/2007   SPONDYLOSIS, LUMBAR 09/22/2007   GERD 04/12/2007    PCP: Neda Balk., MD  REFERRING PROVIDER: Raydell Cahill DO  REFERRING DIAG: L lumbar radiculopathy  THERAPY DIAG:  Other low back pain  Muscle weakness (generalized)  Left lumbar radiculopathy  RATIONALE FOR EVALUATION AND TREATMENT: Rehabilitation  ONSET DATE: chronic  NEXT MD VISIT: 06/24/23   SUBJECTIVE:                                                                                                                                                                                           SUBJECTIVE STATEMENT: Pt notices increased soreness and TTP over his medial tibia and lateral heel, ever since last visit but this has improved over the past few days.  PERTINENT HISTORY:  HOH, Referred by sports medicine MD,   PAIN:  Are you having pain? No   PRECAUTIONS: Fall  RED FLAGS: None   WEIGHT BEARING RESTRICTIONS: No  FALLS:  Has patient fallen in last 6 months? Yes. Number of falls 7  LIVING ENVIRONMENT: Lives with: lives with their family and lives with their spouse Lives in: House/apartment Stairs:  moving  to an apartment with wife in 2 weeks that will be one level Has following equipment at home: [employer] - 2 wheeled and [employer] - 4 wheeled  OCCUPATION: retired  PLOF: Independent  PATIENT GOALS: pt /wife want to improve pain   OBJECTIVE:  Note: Objective measures were completed at Evaluation unless otherwise noted.  DIAGNOSTIC FINDINGS:  MRI completed lumbar but results not in  PATIENT SURVEYS:  Modified Oswestry Modified Oswestry Low Back  Pain Disability Questionnaire: 30 / 50 = 60.0 %   COGNITION: Overall cognitive status: Difficulty to assess due to: baseline not available but wife / MD notes report dementia, hallucinations      SENSATION: WFL  MUSCLE LENGTH: Hamstrings: Right -35 deg; Left -35 deg Thomas test: Right na deg; Left na deg  POSTURE: rounded shoulders, forward head, decreased lumbar lordosis, increased thoracic kyphosis, and posterior pelvic tilt  PALPATION: Non tender with palpation B hips, lumbar region  LUMBAR ROM: unable to assess Lumbar ROM due to balance and poor safety awareness   LOWER EXTREMITY ROM:   wfl B without deficits.   LOWER EXTREMITY MMT:    MMT Right eval Left eval  Hip flexion 4- 4-  Hip extension 4- 4-  Hip abduction 4- 4-  Hip adduction 4- 4-  Hip internal rotation    Hip external rotation    Knee flexion    Knee extension 4- 3+  Ankle dorsiflexion 4 3-  Ankle plantarflexion 3+ 3+  Ankle inversion    Ankle eversion     (Blank rows = not tested)  LUMBAR SPECIAL TESTS:  Straight leg raise test: Positive and Slump test: Positive, both on L  FUNCTIONAL TESTS:  30 seconds chair stand test 8 reps   GAIT: Distance walked: 54' x 2 Assistive device utilized: Single point cane Level of assistance: CGA Comments: very unsteady, scissoring and sway, L foot drop noted  TREATMENT DATE:  05/11/23 THERAPEUTIC EXERCISE: To improve strength, endurance, ROM, and flexibility.  Demonstration, verbal and tactile cues throughout for technique.  NuStep - L5 x 6 min - UEs/Les Seated LAQ 2lb x 10 B Seated HS curls RTB x 10 B Seated toe raises x 10 B Standing heel-toe raises 2 x 10 - limited toe lift on L Standing L gastroc stretch x 30"   NEUROMUSCULAR RE-EDUCATION: To improve balance, coordination, kinesthesia, posture, proprioception, and reduce fall risk. Standing hip ABD with looped RTB at distal thighs 2x5 - cues to avoid hip hike on L side Standing hip extension with  looped RTB at distal thighs x 10 - cues to maintain upright posture  Standing hip flexion march with looped RTB at distal thighs x 10  Seated ab sets 10x5"  05/07/23 THERAPEUTIC EXERCISE: To improve strength, endurance, ROM, and flexibility.  Demonstration, verbal and tactile cues throughout for technique.  NuStep - L5 x 6 min - UEs/LEs Seated hip hinge HS stretch x 30" bil Seated figure-4 hip piriformis stretch x 30" bil Standing heel-toe raises 2 x 10 - limited toe lift on L Standing L gastroc stretch x 30" Attempted YTB resisted L DF in sitting x 5 but minimal toe lift  NEUROMUSCULAR RE-EDUCATION: To improve balance, coordination, kinesthesia, posture, proprioception, and reduce fall risk. Standing hip ABD with looped RTB at distal thighs x 10 - cues to maintain upright posture and avoid trunk lean to lift leg Standing hip extension with looped RTB at distal thighs x 10 - cues to maintain upright posture and avoid trunk flexion to lift  leg Standing hip flexion march with looped RTB at distal thighs x 10  Standing hip flexion SLR with looped RTB at distal thighs x 10  B side-stepping 2 x 10 feet along with looped RTB at distal thighs B tandem stance with intermittent counter support 2 x 30" SLS with counter support 3 x 10" bil   05/05/23 Nustep L5 x 6 min UEs/LEs Seated hamstring stretch x30" Seated figure 4 stretch x30" Seated pball roll out x30", with lateral flexion x30" Seated ab set pball 10x5" Standing heel/toe raise 2x10 Standing hip abduction 1x10 Standing hip ext 1x10 Standing marching 1x10   04/26/23: evaluation, strongly advised pt to utilize the front wheeled walker at home due to his high fall risk Inst in ankle pumps L with black Tband                 PATIENT EDUCATION:  Education details: HEP review, HEP update - added tandem & SLS at counter, and HEP progression - added RTB to distal thigh for 3-way hip strengtheing    Person educated: Patient and  Spouse Education method: Explanation, Demonstration, Tactile cues, Verbal cues, and Handouts Education comprehension: verbalized understanding, returned demonstration, verbal cues required, tactile cues required, and needs further education  HOME EXERCISE PROGRAM: Access Code: [email or web address] URL: [email or web address] Date: 05/07/2023 Prepared by: Felecia Hopper  Exercises - Seated Hamstring Stretch  - 1 x daily - 7 x weekly - 2 sets - 30 sec hold - Seated Figure 4 Piriformis Stretch  - 1 x daily - 7 x weekly - 2 sets - 30 sec hold - Heel Toe Raises with Counter Support  - 1 x daily - 7 x weekly - 2 sets - 10 reps - Standing Hip Extension with Counter Support  - 1 x daily - 7 x weekly - 2-3 sets - 10 reps - Standing Hip Flexion with Counter Support  - 1 x daily - 7 x weekly - 2-3 sets - 10 reps - Standing Hip Abduction with Resistance at Thighs  - 1 x daily - 7 x weekly - 2 sets - 10 reps - 3 sec hold - Standing Tandem Balance with Counter Support  - 1 x daily - 7 x weekly - 3 reps - 20-30 sec hold - Standing Single Leg Stance with Counter Support  - 1 x daily - 7 x weekly - 3-5 reps - 10 sec hold   ASSESSMENT:  CLINICAL IMPRESSION: Progressed exercises to tolerance. Pt still taxed after the interventions today and with his HEP. Many cues required to prevent compensations and target the correct muscles. Brysan will benefit from continued skilled PT to address ongoing strength and balance deficits to improve mobility and activity tolerance with decreased pain interference.   From eval: Patient is a 78 y.o. male  who was evaluated today by physical therapy due to L lumbar radiculopathy. He also has h/o progressive dementia and frequent falls.  Noted marked weakness and atrophy B LE's , most L lower leg with pronounced foot drop.  Emphasized to him today the need to utilize the FWW that he has available at home, not his cane due to his unsteady gait today.  He should benefit from  skilled PT to address his gross LE weakness as well as L LE weakness, which in turn should improve his activity tolerance and reduce his falls and lower back/ radicular pain.  Of note he has melanoma R plantar foot which his wife reports will be surgically removed in 2  weeks which may affect his ability to participate in PT.  Also they are moving in 3 weeks to a new handicap accessible apartment.   OBJECTIVE IMPAIRMENTS: decreased activity tolerance, decreased balance, decreased cognition, decreased coordination, decreased endurance, decreased knowledge of condition, decreased knowledge of use of DME, difficulty walking, decreased strength, decreased safety awareness, impaired perceived functional ability, improper body mechanics, postural dysfunction, and pain.   ACTIVITY LIMITATIONS: carrying, lifting, standing, transfers, reach over head, and locomotion level  PARTICIPATION LIMITATIONS: meal prep, interpersonal relationship, driving, shopping, and community activity  PERSONAL FACTORS: Age, Behavior pattern, Education, Fitness, Past/current experiences, Time since onset of injury/illness/exacerbation, and 1-2 comorbidities: progressive dementia, melanoma R plantar foot to be removed April 30,   are also affecting patient's functional outcome.   REHAB POTENTIAL: Fair high fall risk due to cognitive decline  CLINICAL DECISION MAKING: Evolving/moderate complexity  EVALUATION COMPLEXITY: Moderate   GOALS: Goals reviewed with patient? Yes  SHORT TERM GOALS: Target date: 2 weeks 05/10/23  I HEP Baseline: Goal status: IN PROGRESS - 05/07/23 - cues necessary for proper technique   LONG TERM GOALS: Target date: 06/21/23   Modified Oswestry Low Back Pain Disability Questionnaire: 30 / 50 = 60.0 % improve to 40/50  Baseline:  Goal status: INITIAL  2.  Patient will be able to walk safely greater than 350' with appropriate device.   Baseline: unsafe, high fall risk with st cane Goal status:  INITIAL  3.  30 sec sit to stand increase from 8 reps to 12 reps for evidence of improved endurance, coordination, strength Baseline:  Goal status: INITIAL   PLAN:  PT FREQUENCY: 2x/week  PT DURATION: 8 weeks  PLANNED INTERVENTIONS: 97110-Therapeutic exercises, 97530- Therapeutic activity, 97112- Neuromuscular re-education, 97535- Self Care, and 95621- Manual therapy.  PLAN FOR NEXT SESSION: initiate therex in gym on cardiovascular equipment, and progressive strengthening LE's, balance challenges; continue working on endurance   Rosealie Reach L Yates Weisgerber, PTA 05/11/2023, 2:06 PM      Date of referral: 04/06/23 Referring provider: Raydell Cahill Referring diagnosis? L lumbar radiculopathy Treatment diagnosis? (if different than referring diagnosis) same  What was this (referring dx) caused by? Ongoing Issue  Lonne Roan of Condition: Recurrent (multiple episodes of < 3 months)   Laterality: Both  Current Functional Measure Score: Back Index 30/50  Objective measurements identify impairments when they are compared to normal values, the uninvolved extremity, and prior level of function.  [x]  Yes  []  No  Objective assessment of functional ability: Severe functional limitations   Briefly describe symptoms: chronic pain B lateral lower legs , weakness progressive, constant pain lower back, regardless of position  How did symptoms start: Multiple falls, at least 6 x in last 6 months, weakness and atrophy B LE's with foot drop L  Average pain intensity:  Last 24 hours: not stated  Past week: not stated just reported constant dull pain  How often does the pt experience symptoms? Constantly  How much have the symptoms interfered with usual daily activities? Extremely  How has condition changed since care began at this facility? NA - initial visit  In general, how is the patients overall health? Fair

## 2023-05-12 ENCOUNTER — Other Ambulatory Visit: Payer: Self-pay | Admitting: Family Medicine

## 2023-05-14 ENCOUNTER — Encounter

## 2023-05-17 ENCOUNTER — Ambulatory Visit: Attending: Sports Medicine | Admitting: Physical Therapy

## 2023-05-17 ENCOUNTER — Encounter: Payer: Self-pay | Admitting: Physical Therapy

## 2023-05-17 ENCOUNTER — Encounter: Payer: Self-pay | Admitting: Licensed Clinical Social Worker

## 2023-05-17 DIAGNOSIS — M533 Sacrococcygeal disorders, not elsewhere classified: Secondary | ICD-10-CM | POA: Diagnosis not present

## 2023-05-17 DIAGNOSIS — G8929 Other chronic pain: Secondary | ICD-10-CM | POA: Insufficient documentation

## 2023-05-17 DIAGNOSIS — M25561 Pain in right knee: Secondary | ICD-10-CM | POA: Insufficient documentation

## 2023-05-17 DIAGNOSIS — M5459 Other low back pain: Secondary | ICD-10-CM

## 2023-05-17 DIAGNOSIS — M5416 Radiculopathy, lumbar region: Secondary | ICD-10-CM | POA: Diagnosis not present

## 2023-05-17 DIAGNOSIS — M6281 Muscle weakness (generalized): Secondary | ICD-10-CM | POA: Diagnosis not present

## 2023-05-17 NOTE — Therapy (Signed)
 OUTPATIENT PHYSICAL THERAPY TREATMENT   Patient Name: David [name] MRN: [id number] DOB:[birth year], 78 y.o., male Today's Date: 05/17/2023  END OF SESSION:  PT End of Session - 05/17/23 1021     Visit Number 5    Date for PT Re-Evaluation 06/21/23    Authorization Type [employer] Medicare    Authorization Time Period 04/26/23 - 06/21/23    Authorization - Visit Number 5    Authorization - Number of Visits 16    Progress Note Due on Visit 10    PT Start Time 1021    PT Stop Time 1102    PT Time Calculation (min) 41 min    Activity Tolerance Patient tolerated treatment well    Behavior During Therapy WFL for tasks assessed/performed                 Past Medical History:  Diagnosis Date   Adenoma of right adrenal gland 07/11/2002   2.4cm , noted on CT ABD   Anxiety    Arthritis of both knees 03/26/2016   Astigmatism    Back pain    BCC (basal cell carcinoma of skin)    under right eye and right ear   Blood transfusion without reported diagnosis    Cataract 09/29/2016   Depression    Diverticulitis 2009   Diverticulosis    Family history of breast cancer    Family history of colon cancer    Fatty liver    GERD (gastroesophageal reflux disease)    Gout    Hearing loss of both ears 03/26/2016   History of atrial fibrillation    x 2 none since 2020   History of chronic prostatitis    started at age 63   History of kidney stones    Hx of adenomatous colonic polyps 02/16/2022   3 diminutive - no recall   Hyperglycemia    Hyperlipidemia    Hypertension    Incomplete right bundle branch block (RBBB)    Internal hemorrhoids    Kidney lesion 06/07/2015   Right midportion, 1.1x1.1 cm hyperechoic, noted on US  ABD   LAFB (left anterior fascicular block)    Lipoma of axilla 09/2016   Liver lesion, right lobe 06/07/2015   2.5x2.4x2.4 cm hypoechoic lesion posterior aspect, noted on US  ABD   Low back pain 03/26/2016   LVH (left ventricular hypertrophy) 08/06/2017    Moderate, Noted on ECHO   Medicare annual wellness visit, subsequent 03/12/2014   OA (osteoarthritis)    Back, Hands, Neck   Obesity 11/25/2007   Qualifier: Diagnosis of  By: Elinor Guardian MD, Raenelle Bumpers    Other malaise and fatigue 03/13/2013   Premature ventricular complex    Prostate cancer (HCC) dx 2021   Renal insufficiency    Kidney removed right   Scoliosis    Upper thoracic and lumbar   Sinus bradycardia 08/06/2017   Noted on EKG   Vitamin D  deficiency    resolved   Past Surgical History:  Procedure Laterality Date   CARDIOVERSION  07/31/2017   CATARACT EXTRACTION, BILATERAL     COLON SURGERY  2009   segmental sigmoid resection   COLONOSCOPY     COLONOSCOPY WITH PROPOFOL      CYSTOSCOPY N/A 07/28/2019   Procedure: CYSTOSCOPY FLEXIBLE;  Surgeon: Marco Severs, MD;  Location: [facility];  Service: Urology;  Laterality: N/A;   CYSTOSCOPY W/ RETROGRADES Right 06/07/2018   Procedure: CYSTOSCOPY WITH RETROGRADE PYELOGRAM, uphrostogram;  Surgeon: Marco Severs, MD;  Location: [facility];  Service: Urology;  Laterality: Right;   CYSTOSCOPY WITH URETEROSCOPY AND STENT PLACEMENT Right 02/28/2018   Procedure: CYSTOSCOPY WITH URETEROSCOPY Sallyann Crea;  Surgeon: Ottelin, Mark, MD;  Location: [facility];  Service: Urology;  Laterality: Right;   CYSTOSCOPY/URETEROSCOPY/HOLMIUM LASER/STENT PLACEMENT Right 11/29/2017   Procedure: CYSTOSCOPY/RETROGRADE/URETEROSCOPY/HOLMIUM LASER/STENT PLACEMENT;  Surgeon: Ottelin, Mark, MD;  Location: [facility];  Service: Urology;  Laterality: Right;   EYE SURGERY Bilateral 01/12/2017   cataract removal   history of blood tranfusion  age 1   HOLMIUM LASER APPLICATION Right 09/24/2017   Procedure: HOLMIUM LASER APPLICATION;  Surgeon: Ottelin, Mark, MD;  Location: [facility];  Service: Urology;  Laterality: Right;   IR BALLOON DILATION URETERAL STRICTURE RIGHT  03/14/2018   IR NEPHRO TUBE REMOV/FL  03/17/2018    IR NEPHROSTOGRAM RIGHT THRU EXISTING ACCESS  03/17/2018   IR NEPHROSTOMY EXCHANGE RIGHT  03/14/2018   IR NEPHROSTOMY EXCHANGE RIGHT  06/21/2018   IR NEPHROSTOMY PLACEMENT RIGHT  03/02/2018   IR NEPHROSTOMY PLACEMENT RIGHT  04/20/2018   IR URETERAL STENT PLACEMENT EXISTING ACCESS RIGHT  03/14/2018   NOSE SURGERY     Submucous resection age 20   NUCLEAR STRESS TEST  06/03/2009   RADIOACTIVE SEED IMPLANT N/A 07/28/2019   Procedure: RADIOACTIVE SEED IMPLANT/BRACHYTHERAPY IMPLANT;  Surgeon: Marco Severs, MD;  Location: [facility];  Service: Urology;  Laterality: N/A;  90 MINS   ROBOT ASSISTED LAPAROSCOPIC NEPHRECTOMY Right 08/04/2018   Procedure: XI ROBOTIC ASSISTED LAPAROSCOPIC NEPHRECTOMY;  Surgeon: Marco Severs, MD;  Location: [facility];  Service: Urology;  Laterality: Right;  3 HRS   ROBOT ASSISTED PYELOPLASTY Right 06/07/2018   Procedure: attempted XI ROBOTIC ASSISTED PYELOPLASTY, lysis of adhesions;  Surgeon: Marco Severs, MD;  Location: [facility];  Service: Urology;  Laterality: Right;  3 HRS   SKIN BIOPSY     SPACE OAR INSTILLATION N/A 07/28/2019   Procedure: SPACE OAR INSTILLATION;  Surgeon: Marco Severs, MD;  Location: [facility];  Service: Urology;  Laterality: N/A;   URETEROSCOPY WITH HOLMIUM LASER LITHOTRIPSY Right 09/24/2017   Procedure: CYSTOSCOPY, URETEROSCOPY WITH HOLMIUM LASER LITHOTRIPSY, STENT PLACEMENT;  Surgeon: Ottelin, Mark, MD;  Location: [facility];  Service: Urology;  Laterality: Right;   Patient Active Problem List   Diagnosis Date Noted   Gait abnormality 04/22/2023   Aortic root dilatation (HCC) 03/31/2023   Sacral pain 12/08/2022   Right shoulder pain 09/30/2022   Moderate dementia with mood disturbance (HCC) 07/12/2022   Chronic right ear pain 03/11/2022   Chronic anticoagulation 02/11/2022   CRI (chronic renal insufficiency) 10/29/2021   Abdominal pain 10/29/2021   Sun-damaged skin  07/23/2021   H/O prostate cancer 07/23/2021   Left knee pain 07/23/2021   Coronary atherosclerosis 02/11/2021   Myalgia 11/04/2020   Neck pain 11/04/2020   Stage 3a chronic kidney disease (HCC) 07/02/2020   Urinary urgency 02/12/2020   Colon cancer screening 09/26/2019   Family history of breast cancer    Family history of colon cancer    Educated about COVID-19 virus infection 08/26/2019   Prostate cancer (HCC) 04/28/2019   Pruritus 02/03/2019   LVH (left ventricular hypertrophy) 10/30/2018   Tinea versicolor 09/28/2018   Thoracic back pain 09/26/2018   Hypercalcemia 08/08/2018   Nonfunctioning kidney 08/04/2018   Arm pain 07/18/2018   Right hip pain 06/22/2018   Ureteral obstruction 06/07/2018   Recurrent nephrolithiasis 04/23/2018   Hydronephrosis 03/01/2018   Floppy  eyelid syndrome of both eyes 01/24/2018   Foot pain, bilateral 12/28/2017   Kidney stone 09/18/2017   Insomnia 09/17/2017   Abnormal TSH 04/15/2017   Senile nuclear sclerosis 01/18/2017   Cataract 09/29/2016   Muscle cramp 09/29/2016   Nocturia 09/29/2016   Hyperglycemia 03/29/2016   Low back pain with radiation 03/26/2016   Cataracts, bilateral 03/26/2016   Arthritis of both knees 03/26/2016   Hearing loss 03/26/2016   Complex renal cyst 06/16/2015   History of atrial fibrillation 06/16/2015   Snoring 06/07/2015   Somnolence 06/07/2015   Preventative health care 03/07/2015   Lipoma of axilla 06/04/2014   Tinea cruris 03/20/2014   Lichen simplex chronicus 03/20/2014   Medicare annual wellness visit, subsequent 03/12/2014   Depression with anxiety 09/25/2013   Constipation 09/25/2013   Gouty arthritis of toe of left foot 08/01/2013   Skin lesion 05/27/2013   Rash 05/26/2013   Abnormal liver function 03/18/2013   IBS (irritable bowel syndrome) 03/18/2013   Other malaise and fatigue 03/13/2013   Tinea corporis 03/13/2013   Gout 12/14/2012   Diarrhea 12/14/2012   Rectal irritation 09/18/2012    Diverticulosis    BCC (basal cell carcinoma of skin)    Scoliosis 01/19/2011   Hyperlipidemia, mixed 03/11/2010   Essential hypertension, benign 03/11/2010   Allergic rhinitis 03/11/2010   Arthralgia 03/11/2010   Obesity 11/25/2007   BENIGN NEOPLASM OF ADRENAL GLAND 09/22/2007   Fatty liver disease, nonalcoholic 09/22/2007   Osteoarthritis 09/22/2007   SPONDYLOSIS, LUMBAR 09/22/2007   GERD 04/12/2007    PCP: Neda Balk., MD  REFERRING PROVIDER: Raydell Cahill DO  REFERRING DIAG: L lumbar radiculopathy  THERAPY DIAG:  Other low back pain  Muscle weakness (generalized)  Left lumbar radiculopathy  RATIONALE FOR EVALUATION AND TREATMENT: Rehabilitation  ONSET DATE: chronic  NEXT MD VISIT: 06/24/23   SUBJECTIVE:                                                                                                                                                                                           SUBJECTIVE STATEMENT: Pt reports pain in B legs today.  PERTINENT HISTORY:  HOH, Referred by sports medicine MD,   PAIN:  Are you having pain? Yes: NPRS scale: 1-6/10  Pain location: L foot and lower Pain description: sharp, intermittent stabbing  Aggravating factors: lifting his leg, extending great toe Relieving factors: nothing, rest     Are you having pain? Yes: NPRS scale: 5/10 Pain location: R LE Pain description: sharp  Aggravating factors: stiffens up with sitting  Relieving factors: lying flat in bed     PRECAUTIONS: Fall  RED FLAGS: None   WEIGHT BEARING RESTRICTIONS: No  FALLS:  Has patient fallen in last 6 months? Yes. Number of falls 7  LIVING ENVIRONMENT: Lives with: lives with their family and lives with their spouse Lives in: House/apartment Stairs:  moving to an apartment with wife in 2 weeks that will be one level Has following equipment at home: [employer] - 2 wheeled and [employer] - 4 wheeled  OCCUPATION: retired  PLOF:  Independent  PATIENT GOALS: pt /wife want to improve pain   OBJECTIVE:  Note: Objective measures were completed at Evaluation unless otherwise noted.  DIAGNOSTIC FINDINGS:  MRI completed lumbar but results not in  PATIENT SURVEYS:  Modified Oswestry Modified Oswestry Low Back Pain Disability Questionnaire: 30 / 50 = 60.0 %   COGNITION: Overall cognitive status: Difficulty to assess due to: baseline not available but wife / MD notes report dementia, hallucinations      SENSATION: WFL  MUSCLE LENGTH: Hamstrings: Right -35 deg; Left -35 deg Thomas test: Right na deg; Left na deg  POSTURE: rounded shoulders, forward head, decreased lumbar lordosis, increased thoracic kyphosis, and posterior pelvic tilt  PALPATION: Non tender with palpation B hips, lumbar region  LUMBAR ROM: unable to assess Lumbar ROM due to balance and poor safety awareness   LOWER EXTREMITY ROM:   wfl B without deficits.   LOWER EXTREMITY MMT:    MMT Right eval Left eval  Hip flexion 4- 4-  Hip extension 4- 4-  Hip abduction 4- 4-  Hip adduction 4- 4-  Hip internal rotation    Hip external rotation    Knee flexion    Knee extension 4- 3+  Ankle dorsiflexion 4 3-  Ankle plantarflexion 3+ 3+  Ankle inversion    Ankle eversion     (Blank rows = not tested)  LUMBAR SPECIAL TESTS:  Straight leg raise test: Positive and Slump test: Positive, both on L  FUNCTIONAL TESTS:  30 seconds chair stand test 8 reps   GAIT: Distance walked: 34' x 2 Assistive device utilized: Single point cane Level of assistance: CGA Comments: very unsteady, scissoring and sway, L foot drop noted  TREATMENT DATE:   05/17/23 THERAPEUTIC EXERCISE: To improve strength, endurance, ROM, and flexibility.  Demonstration, verbal and tactile cues throughout for technique.  NuStep - L5 x 6 min - UEs/LEs Seated hip hinge HS stretch x 30" bil Seated figure-4 hip piriformis stretch x 30" bil Seated L DF AROM x 10 Standing  heel-toe raises 2 x 10 - limited toe lift on L Standing L gastroc stretch 2 x 30"  NEUROMUSCULAR RE-EDUCATION: To improve balance, coordination, kinesthesia, posture, proprioception, and reduce fall risk.  Standing hip ABD with looped RTB at distal thighs x 10 - cues to maintain upright posture and avoid trunk lean to lift leg Standing hip extension with looped RTB at distal thighs x 10 - cues to maintain upright posture and avoid trunk flexion to lift leg Standing hip flexion march with looped RTB at distal thighs x 10  B tandem stance with intermittent counter support 2 x 30" SLS with counter support 2 x 10-20" bil   05/11/23 THERAPEUTIC EXERCISE: To improve strength, endurance, ROM, and flexibility.  Demonstration, verbal and tactile cues throughout for technique.  NuStep - L5 x 6 min - UEs/Les Seated LAQ 2lb x 10 B Seated HS curls RTB x 10 B Seated toe raises x 10 B Standing heel-toe raises 2 x 10 - limited toe lift on L Standing L  gastroc stretch x 30"  NEUROMUSCULAR RE-EDUCATION: To improve balance, coordination, kinesthesia, posture, proprioception, and reduce fall risk. Standing hip ABD with looped RTB at distal thighs 2x5 - cues to avoid hip hike on L side Standing hip extension with looped RTB at distal thighs x 10 - cues to maintain upright posture  Standing hip flexion march with looped RTB at distal thighs x 10  Seated ab sets 10x5"   05/07/23 THERAPEUTIC EXERCISE: To improve strength, endurance, ROM, and flexibility.  Demonstration, verbal and tactile cues throughout for technique.  NuStep - L5 x 6 min - UEs/LEs Seated hip hinge HS stretch x 30" bil Seated figure-4 hip piriformis stretch x 30" bil Standing heel-toe raises 2 x 10 - limited toe lift on L Standing L gastroc stretch x 30" Attempted YTB resisted L DF in sitting x 5 but minimal toe lift  NEUROMUSCULAR RE-EDUCATION: To improve balance, coordination, kinesthesia, posture, proprioception, and reduce fall  risk. Standing hip ABD with looped RTB at distal thighs x 10 - cues to maintain upright posture and avoid trunk lean to lift leg Standing hip extension with looped RTB at distal thighs x 10 - cues to maintain upright posture and avoid trunk flexion to lift leg Standing hip flexion march with looped RTB at distal thighs x 10  Standing hip flexion SLR with looped RTB at distal thighs x 10  B side-stepping 2 x 10 feet along with looped RTB at distal thighs B tandem stance with intermittent counter support 2 x 30" SLS with counter support 3 x 10" bil   PATIENT EDUCATION:  Education details: HEP review   Person educated: Patient Education method: Programmer, multimedia, Demonstration, and Verbal cues Education comprehension: verbalized understanding, returned demonstration, verbal cues required, and needs further education  HOME EXERCISE PROGRAM: Access Code: [email or web address] URL: [email or web address] Date: 05/07/2023 Prepared by: Felecia Hopper  Exercises - Seated Hamstring Stretch  - 1 x daily - 7 x weekly - 2 sets - 30 sec hold - Seated Figure 4 Piriformis Stretch  - 1 x daily - 7 x weekly - 2 sets - 30 sec hold - Heel Toe Raises with Counter Support  - 1 x daily - 7 x weekly - 2 sets - 10 reps - Standing Hip Extension with Counter Support  - 1 x daily - 7 x weekly - 2-3 sets - 10 reps - Standing Hip Flexion with Counter Support  - 1 x daily - 7 x weekly - 2-3 sets - 10 reps - Standing Hip Abduction with Resistance at Thighs  - 1 x daily - 7 x weekly - 2 sets - 10 reps - 3 sec hold - Standing Tandem Balance with Counter Support  - 1 x daily - 7 x weekly - 3 reps - 20-30 sec hold - Standing Single Leg Stance with Counter Support  - 1 x daily - 7 x weekly - 3-5 reps - 10 sec hold   ASSESSMENT:  CLINICAL IMPRESSION: Altonio reports pain in B LEs today but states the warm-up on the NuStep seems to help.  When questioned about his HEP, he requested review of the entire HEP.  Continued cues  necessary for proper technique on nearly all exercises, especially posture and avoidance of accessory motion at trunk on standing hip exercises, as well as use of light UE support for balance exercises to reduce fall risk - STG still in progress.  Fred continues to fatigue quickly, especially with standing exercises, requiring intermittent seated rest breaks, therefore encouraged  him to try completing exercises in smaller groups at home spaced out throughout the day and potentially perform strengthening exercises on alternating days to allow recovery days.  Cues necessary for proper alignment and sequencing with SBQC during gait throughout the clinic as patient has tendency to rotate [facility] in different directions as well as reach beyond single stride length.  Shain will benefit from continued skilled PT to address ongoing strength and balance deficits to improve mobility and activity tolerance with decreased pain interference.   From eval: Patient is a 78 y.o. male  who was evaluated today by physical therapy due to L lumbar radiculopathy. He also has h/o progressive dementia and frequent falls.  Noted marked weakness and atrophy B LE's , most L lower leg with pronounced foot drop.  Emphasized to him today the need to utilize the FWW that he has available at home, not his cane due to his unsteady gait today.  He should benefit from skilled PT to address his gross LE weakness as well as L LE weakness, which in turn should improve his activity tolerance and reduce his falls and lower back/ radicular pain.  Of note he has melanoma R plantar foot which his wife reports will be surgically removed in 2 weeks which may affect his ability to participate in PT.  Also they are moving in 3 weeks to a new handicap accessible apartment.   OBJECTIVE IMPAIRMENTS: decreased activity tolerance, decreased balance, decreased cognition, decreased coordination, decreased endurance, decreased knowledge of condition, decreased  knowledge of use of DME, difficulty walking, decreased strength, decreased safety awareness, impaired perceived functional ability, improper body mechanics, postural dysfunction, and pain.   ACTIVITY LIMITATIONS: carrying, lifting, standing, transfers, reach over head, and locomotion level  PARTICIPATION LIMITATIONS: meal prep, interpersonal relationship, driving, shopping, and community activity  PERSONAL FACTORS: Age, Behavior pattern, Education, Fitness, Past/current experiences, Time since onset of injury/illness/exacerbation, and 1-2 comorbidities: progressive dementia, melanoma R plantar foot to be removed April 30,   are also affecting patient's functional outcome.   REHAB POTENTIAL: Fair high fall risk due to cognitive decline  CLINICAL DECISION MAKING: Evolving/moderate complexity  EVALUATION COMPLEXITY: Moderate   GOALS: Goals reviewed with patient? Yes  SHORT TERM GOALS: Target date: 2 weeks 05/10/23  I HEP Baseline: Goal status: IN PROGRESS - 05/17/23 - pt requesting further review   LONG TERM GOALS: Target date: 06/21/23   Modified Oswestry Low Back Pain Disability Questionnaire: 30 / 50 = 60.0 % improve to 40/50  Baseline:  Goal status: INITIAL  2.  Patient will be able to walk safely greater than 350' with appropriate device.   Baseline: unsafe, high fall risk with st cane Goal status: INITIAL  3.  30 sec sit to stand increase from 8 reps to 12 reps for evidence of improved endurance, coordination, strength Baseline:  Goal status: INITIAL   PLAN:  PT FREQUENCY: 2x/week  PT DURATION: 8 weeks  PLANNED INTERVENTIONS: 97110-Therapeutic exercises, 97530- Therapeutic activity, 97112- Neuromuscular re-education, 97535- Self Care, and 16109- Manual therapy.  PLAN FOR NEXT SESSION:  therex in gym on cardiovascular equipment, and progressive strengthening LE's, balance challenges; continue working on endurance   Francisco Irving, PT 05/17/2023, 11:18 AM      Date  of referral: 04/06/23 Referring provider: Raydell Cahill Referring diagnosis? L lumbar radiculopathy Treatment diagnosis? (if different than referring diagnosis) same  What was this (referring dx) caused by? Ongoing Issue  Lonne Roan of Condition: Recurrent (multiple episodes of < 3 months)   Laterality:  Both  Current Functional Measure Score: Back Index 30/50  Objective measurements identify impairments when they are compared to normal values, the uninvolved extremity, and prior level of function.  [x]  Yes  []  No  Objective assessment of functional ability: Severe functional limitations   Briefly describe symptoms: chronic pain B lateral lower legs , weakness progressive, constant pain lower back, regardless of position  How did symptoms start: Multiple falls, at least 6 x in last 6 months, weakness and atrophy B LE's with foot drop L  Average pain intensity:  Last 24 hours: not stated  Past week: not stated just reported constant dull pain  How often does the pt experience symptoms? Constantly  How much have the symptoms interfered with usual daily activities? Extremely  How has condition changed since care began at this facility? NA - initial visit  In general, how is the patients overall health? Fair

## 2023-05-18 DIAGNOSIS — C4371 Malignant melanoma of right lower limb, including hip: Secondary | ICD-10-CM | POA: Diagnosis not present

## 2023-05-19 ENCOUNTER — Ambulatory Visit

## 2023-05-24 ENCOUNTER — Ambulatory Visit: Admitting: Physical Therapy

## 2023-05-24 DIAGNOSIS — M6281 Muscle weakness (generalized): Secondary | ICD-10-CM

## 2023-05-24 DIAGNOSIS — M5459 Other low back pain: Secondary | ICD-10-CM

## 2023-05-24 DIAGNOSIS — G8929 Other chronic pain: Secondary | ICD-10-CM | POA: Diagnosis not present

## 2023-05-24 DIAGNOSIS — M5416 Radiculopathy, lumbar region: Secondary | ICD-10-CM

## 2023-05-24 DIAGNOSIS — M533 Sacrococcygeal disorders, not elsewhere classified: Secondary | ICD-10-CM | POA: Diagnosis not present

## 2023-05-24 DIAGNOSIS — M25561 Pain in right knee: Secondary | ICD-10-CM | POA: Diagnosis not present

## 2023-05-24 NOTE — Therapy (Signed)
 OUTPATIENT PHYSICAL THERAPY TREATMENT   Patient Name: David [name] MRN: [id number] DOB:[birth year], 78 y.o., male Today's Date: 05/24/2023  END OF SESSION:  PT End of Session - 05/24/23 1137     Visit Number 6    Date for PT Re-Evaluation 06/21/23    Authorization Type [employer] Medicare    Authorization Time Period 04/26/23 - 06/21/23    Authorization - Number of Visits 16    Progress Note Due on Visit 10    Activity Tolerance Patient tolerated treatment well    Behavior During Therapy [facility] for tasks assessed/performed                 Past Medical History:  Diagnosis Date   Adenoma of right adrenal gland 07/11/2002   2.4cm , noted on CT ABD   Anxiety    Arthritis of both knees 03/26/2016   Astigmatism    Back pain    BCC (basal cell carcinoma of skin)    under right eye and right ear   Blood transfusion without reported diagnosis    Cataract 09/29/2016   Depression    Diverticulitis 2009   Diverticulosis    Family history of breast cancer    Family history of colon cancer    Fatty liver    GERD (gastroesophageal reflux disease)    Gout    Hearing loss of both ears 03/26/2016   History of atrial fibrillation    x 2 none since 2020   History of chronic prostatitis    started at age 42   History of kidney stones    Hx of adenomatous colonic polyps 02/16/2022   3 diminutive - no recall   Hyperglycemia    Hyperlipidemia    Hypertension    Incomplete right bundle branch block (RBBB)    Internal hemorrhoids    Kidney lesion 06/07/2015   Right midportion, 1.1x1.1 cm hyperechoic, noted on US  ABD   LAFB (left anterior fascicular block)    Lipoma of axilla 09/2016   Liver lesion, right lobe 06/07/2015   2.5x2.4x2.4 cm hypoechoic lesion posterior aspect, noted on US  ABD   Low back pain 03/26/2016   LVH (left ventricular hypertrophy) 08/06/2017   Moderate, Noted on ECHO   Medicare annual wellness visit, subsequent 03/12/2014   OA (osteoarthritis)    Back, Hands,  Neck   Obesity 11/25/2007   Qualifier: Diagnosis of  By: Elinor Guardian MD, Raenelle Bumpers    Other malaise and fatigue 03/13/2013   Premature ventricular complex    Prostate cancer (HCC) dx 2021   Renal insufficiency    Kidney removed right   Scoliosis    Upper thoracic and lumbar   Sinus bradycardia 08/06/2017   Noted on EKG   Vitamin D  deficiency    resolved   Past Surgical History:  Procedure Laterality Date   CARDIOVERSION  07/31/2017   CATARACT EXTRACTION, BILATERAL     COLON SURGERY  2009   segmental sigmoid resection   COLONOSCOPY     COLONOSCOPY WITH PROPOFOL      CYSTOSCOPY N/A 07/28/2019   Procedure: CYSTOSCOPY FLEXIBLE;  Surgeon: Marco Severs, MD;  Location: [facility];  Service: Urology;  Laterality: N/A;   CYSTOSCOPY W/ RETROGRADES Right 06/07/2018   Procedure: CYSTOSCOPY WITH RETROGRADE PYELOGRAM, uphrostogram;  Surgeon: Marco Severs, MD;  Location: [facility];  Service: Urology;  Laterality: Right;   CYSTOSCOPY WITH URETEROSCOPY AND STENT PLACEMENT Right 02/28/2018   Procedure: CYSTOSCOPY WITH URETEROSCOPY Sallyann Crea;  Surgeon: Ottelin, Mark,  MD;  Location: [facility];  Service: Urology;  Laterality: Right;   CYSTOSCOPY/URETEROSCOPY/HOLMIUM LASER/STENT PLACEMENT Right 11/29/2017   Procedure: CYSTOSCOPY/RETROGRADE/URETEROSCOPY/HOLMIUM LASER/STENT PLACEMENT;  Surgeon: Ottelin, Mark, MD;  Location: [facility];  Service: Urology;  Laterality: Right;   EYE SURGERY Bilateral 01/12/2017   cataract removal   history of blood tranfusion  age 60   HOLMIUM LASER APPLICATION Right 09/24/2017   Procedure: HOLMIUM LASER APPLICATION;  Surgeon: Ottelin, Mark, MD;  Location: [facility];  Service: Urology;  Laterality: Right;   IR BALLOON DILATION URETERAL STRICTURE RIGHT  03/14/2018   IR NEPHRO TUBE REMOV/FL  03/17/2018   IR NEPHROSTOGRAM RIGHT THRU EXISTING ACCESS  03/17/2018   IR NEPHROSTOMY EXCHANGE RIGHT  03/14/2018   IR NEPHROSTOMY  EXCHANGE RIGHT  06/21/2018   IR NEPHROSTOMY PLACEMENT RIGHT  03/02/2018   IR NEPHROSTOMY PLACEMENT RIGHT  04/20/2018   IR URETERAL STENT PLACEMENT EXISTING ACCESS RIGHT  03/14/2018   NOSE SURGERY     Submucous resection age 53   NUCLEAR STRESS TEST  06/03/2009   RADIOACTIVE SEED IMPLANT N/A 07/28/2019   Procedure: RADIOACTIVE SEED IMPLANT/BRACHYTHERAPY IMPLANT;  Surgeon: Marco Severs, MD;  Location: [facility];  Service: Urology;  Laterality: N/A;  90 MINS   ROBOT ASSISTED LAPAROSCOPIC NEPHRECTOMY Right 08/04/2018   Procedure: XI ROBOTIC ASSISTED LAPAROSCOPIC NEPHRECTOMY;  Surgeon: Marco Severs, MD;  Location: [facility];  Service: Urology;  Laterality: Right;  3 HRS   ROBOT ASSISTED PYELOPLASTY Right 06/07/2018   Procedure: attempted XI ROBOTIC ASSISTED PYELOPLASTY, lysis of adhesions;  Surgeon: Marco Severs, MD;  Location: [facility];  Service: Urology;  Laterality: Right;  3 HRS   SKIN BIOPSY     SPACE OAR INSTILLATION N/A 07/28/2019   Procedure: SPACE OAR INSTILLATION;  Surgeon: Marco Severs, MD;  Location: [facility];  Service: Urology;  Laterality: N/A;   URETEROSCOPY WITH HOLMIUM LASER LITHOTRIPSY Right 09/24/2017   Procedure: CYSTOSCOPY, URETEROSCOPY WITH HOLMIUM LASER LITHOTRIPSY, STENT PLACEMENT;  Surgeon: Ottelin, Mark, MD;  Location: [facility];  Service: Urology;  Laterality: Right;   Patient Active Problem List   Diagnosis Date Noted   Gait abnormality 04/22/2023   Aortic root dilatation (HCC) 03/31/2023   Sacral pain 12/08/2022   Right shoulder pain 09/30/2022   Moderate dementia with mood disturbance (HCC) 07/12/2022   Chronic right ear pain 03/11/2022   Chronic anticoagulation 02/11/2022   CRI (chronic renal insufficiency) 10/29/2021   Abdominal pain 10/29/2021   Sun-damaged skin 07/23/2021   H/O prostate cancer 07/23/2021   Left knee pain 07/23/2021   Coronary atherosclerosis 02/11/2021   Myalgia  11/04/2020   Neck pain 11/04/2020   Stage 3a chronic kidney disease (HCC) 07/02/2020   Urinary urgency 02/12/2020   Colon cancer screening 09/26/2019   Family history of breast cancer    Family history of colon cancer    Educated about COVID-19 virus infection 08/26/2019   Prostate cancer (HCC) 04/28/2019   Pruritus 02/03/2019   LVH (left ventricular hypertrophy) 10/30/2018   Tinea versicolor 09/28/2018   Thoracic back pain 09/26/2018   Hypercalcemia 08/08/2018   Nonfunctioning kidney 08/04/2018   Arm pain 07/18/2018   Right hip pain 06/22/2018   Ureteral obstruction 06/07/2018   Recurrent nephrolithiasis 04/23/2018   Hydronephrosis 03/01/2018   Floppy eyelid syndrome of both eyes 01/24/2018   Foot pain, bilateral 12/28/2017   Kidney stone 09/18/2017   Insomnia 09/17/2017   Abnormal TSH 04/15/2017   Senile nuclear  sclerosis 01/18/2017   Cataract 09/29/2016   Muscle cramp 09/29/2016   Nocturia 09/29/2016   Hyperglycemia 03/29/2016   Low back pain with radiation 03/26/2016   Cataracts, bilateral 03/26/2016   Arthritis of both knees 03/26/2016   Hearing loss 03/26/2016   Complex renal cyst 06/16/2015   History of atrial fibrillation 06/16/2015   Snoring 06/07/2015   Somnolence 06/07/2015   Preventative health care 03/07/2015   Lipoma of axilla 06/04/2014   Tinea cruris 03/20/2014   Lichen simplex chronicus 03/20/2014   Medicare annual wellness visit, subsequent 03/12/2014   Depression with anxiety 09/25/2013   Constipation 09/25/2013   Gouty arthritis of toe of left foot 08/01/2013   Skin lesion 05/27/2013   Rash 05/26/2013   Abnormal liver function 03/18/2013   IBS (irritable bowel syndrome) 03/18/2013   Other malaise and fatigue 03/13/2013   Tinea corporis 03/13/2013   Gout 12/14/2012   Diarrhea 12/14/2012   Rectal irritation 09/18/2012   Diverticulosis    BCC (basal cell carcinoma of skin)    Scoliosis 01/19/2011   Hyperlipidemia, mixed 03/11/2010    Essential hypertension, benign 03/11/2010   Allergic rhinitis 03/11/2010   Arthralgia 03/11/2010   Obesity 11/25/2007   BENIGN NEOPLASM OF ADRENAL GLAND 09/22/2007   Fatty liver disease, nonalcoholic 09/22/2007   Osteoarthritis 09/22/2007   SPONDYLOSIS, LUMBAR 09/22/2007   GERD 04/12/2007    PCP: Neda Balk., MD  REFERRING PROVIDER: Raydell Cahill DO  REFERRING DIAG: L lumbar radiculopathy  THERAPY DIAG:  Other low back pain  Muscle weakness (generalized)  Left lumbar radiculopathy  RATIONALE FOR EVALUATION AND TREATMENT: Rehabilitation  ONSET DATE: chronic  NEXT MD VISIT: 06/24/23   SUBJECTIVE:                                                                                                                                                                                           SUBJECTIVE STATEMENT: Pt states he has been sore since last treatment session. Hamstring feel tight. Has been getting sharp pains in his feet that go up his leg. Reports burning pain in the L ankle. States he is still packing and unpacking for his move.   PERTINENT HISTORY:  HOH, Referred by sports medicine MD,   PAIN:  Are you having pain? Yes: NPRS scale: 1-6/10  Pain location: L foot and lower Pain description: sharp, intermittent stabbing  Aggravating factors: lifting his leg, extending great toe Relieving factors: nothing, rest    Are you having pain? Yes: NPRS scale: 5/10 Pain location: R LE Pain description: sharp  Aggravating factors: stiffens up with sitting  Relieving factors: lying flat in bed  PRECAUTIONS: Fall  RED FLAGS: None   WEIGHT BEARING RESTRICTIONS: No  FALLS:  Has patient fallen in last 6 months? Yes. Number of falls 7  LIVING ENVIRONMENT: Lives with: lives with their family and lives with their spouse Lives in: House/apartment Stairs: moving to an apartment with wife in 2 weeks that will be one level Has following equipment at home: [employer] - 2  wheeled and [employer] - 4 wheeled  OCCUPATION: retired  PLOF: Independent  PATIENT GOALS: pt /wife want to improve pain   OBJECTIVE:  Note: Objective measures were completed at Evaluation unless otherwise noted.  DIAGNOSTIC FINDINGS:  MRI completed lumbar but results not in  PATIENT SURVEYS:  Modified Oswestry Modified Oswestry Low Back Pain Disability Questionnaire: 30 / 50 = 60.0 %   COGNITION: Overall cognitive status: Difficulty to assess due to: baseline not available but wife / MD notes report dementia, hallucinations     SENSATION: WFL  MUSCLE LENGTH: Hamstrings: Right -35 deg; Left -35 deg Thomas test: Right na deg; Left na deg  POSTURE: rounded shoulders, forward head, decreased lumbar lordosis, increased thoracic kyphosis, and posterior pelvic tilt  PALPATION: Non tender with palpation B hips, lumbar region  LUMBAR ROM: unable to assess Lumbar ROM due to balance and poor safety awareness   LOWER EXTREMITY ROM:   wfl B without deficits.   LOWER EXTREMITY MMT:    MMT Right eval Left eval  Hip flexion 4- 4-  Hip extension 4- 4-  Hip abduction 4- 4-  Hip adduction 4- 4-  Hip internal rotation    Hip external rotation    Knee flexion    Knee extension 4- 3+  Ankle dorsiflexion 4 3-  Ankle plantarflexion 3+ 3+  Ankle inversion    Ankle eversion     (Blank rows = not tested)  LUMBAR SPECIAL TESTS:  Straight leg raise test: Positive and Slump test: Positive, both on L  FUNCTIONAL TESTS:  30 seconds chair stand test 8 reps   GAIT: Distance walked: 71' x 2 Assistive device utilized: Single point cane Level of assistance: CGA Comments: very unsteady, scissoring and sway, L foot drop noted  TREATMENT DATE:  05/24/23 Nustep L4 x 6 min UEs/LEs Seated calf stretch with strap 2x30" Seated hamstring stretch x 30" Seated figure 4 stretch 2x 30" Supine hip flexor/quad stretch x 30" IASTM with massage roller bilat quads, hamstrings, calves  Supine  clamshell 2x10 Supine LTR 2x10" Supine PPT x20   05/17/23 THERAPEUTIC EXERCISE: To improve strength, endurance, ROM, and flexibility.  Demonstration, verbal and tactile cues throughout for technique.  NuStep - L5 x 6 min - UEs/LEs Seated hip hinge HS stretch x 30" bil Seated figure-4 hip piriformis stretch x 30" bil Seated L DF AROM x 10 Standing heel-toe raises 2 x 10 - limited toe lift on L Standing L gastroc stretch 2 x 30"  NEUROMUSCULAR RE-EDUCATION: To improve balance, coordination, kinesthesia, posture, proprioception, and reduce fall risk.  Standing hip ABD with looped RTB at distal thighs x 10 - cues to maintain upright posture and avoid trunk lean to lift leg Standing hip extension with looped RTB at distal thighs x 10 - cues to maintain upright posture and avoid trunk flexion to lift leg Standing hip flexion march with looped RTB at distal thighs x 10  B tandem stance with intermittent counter support 2 x 30" SLS with counter support 2 x 10-20" bil   05/11/23 THERAPEUTIC EXERCISE: To improve strength, endurance, ROM, and flexibility.  Demonstration, verbal and tactile cues throughout for technique.  NuStep - L5 x 6 min - UEs/Les Seated LAQ 2lb x 10 B Seated HS curls RTB x 10 B Seated toe raises x 10 B Standing heel-toe raises 2 x 10 - limited toe lift on L Standing L gastroc stretch x 30"  NEUROMUSCULAR RE-EDUCATION: To improve balance, coordination, kinesthesia, posture, proprioception, and reduce fall risk. Standing hip ABD with looped RTB at distal thighs 2x5 - cues to avoid hip hike on L side Standing hip extension with looped RTB at distal thighs x 10 - cues to maintain upright posture  Standing hip flexion march with looped RTB at distal thighs x 10  Seated ab sets 10x5"   05/07/23 THERAPEUTIC EXERCISE: To improve strength, endurance, ROM, and flexibility.  Demonstration, verbal and tactile cues throughout for technique.  NuStep - L5 x 6 min - UEs/LEs Seated hip  hinge HS stretch x 30" bil Seated figure-4 hip piriformis stretch x 30" bil Standing heel-toe raises 2 x 10 - limited toe lift on L Standing L gastroc stretch x 30" Attempted YTB resisted L DF in sitting x 5 but minimal toe lift  NEUROMUSCULAR RE-EDUCATION: To improve balance, coordination, kinesthesia, posture, proprioception, and reduce fall risk. Standing hip ABD with looped RTB at distal thighs x 10 - cues to maintain upright posture and avoid trunk lean to lift leg Standing hip extension with looped RTB at distal thighs x 10 - cues to maintain upright posture and avoid trunk flexion to lift leg Standing hip flexion march with looped RTB at distal thighs x 10  Standing hip flexion SLR with looped RTB at distal thighs x 10  B side-stepping 2 x 10 feet along with looped RTB at distal thighs B tandem stance with intermittent counter support 2 x 30" SLS with counter support 3 x 10" bil   PATIENT EDUCATION:  Education details: HEP review   Person educated: Patient Education method: Programmer, multimedia, Demonstration, and Verbal cues Education comprehension: verbalized understanding, returned demonstration, verbal cues required, and needs further education  HOME EXERCISE PROGRAM: Access Code: [email or web address] URL: [email or web address] Date: 05/07/2023 Prepared by: Felecia Hopper  Exercises - Seated Hamstring Stretch  - 1 x daily - 7 x weekly - 2 sets - 30 sec hold - Seated Figure 4 Piriformis Stretch  - 1 x daily - 7 x weekly - 2 sets - 30 sec hold - Heel Toe Raises with Counter Support  - 1 x daily - 7 x weekly - 2 sets - 10 reps - Standing Hip Extension with Counter Support  - 1 x daily - 7 x weekly - 2-3 sets - 10 reps - Standing Hip Flexion with Counter Support  - 1 x daily - 7 x weekly - 2-3 sets - 10 reps - Standing Hip Abduction with Resistance at Thighs  - 1 x daily - 7 x weekly - 2 sets - 10 reps - 3 sec hold - Standing Tandem Balance with Counter Support  - 1 x daily - 7 x  weekly - 3 reps - 20-30 sec hold - Standing Single Leg Stance with Counter Support  - 1 x daily - 7 x weekly - 3-5 reps - 10 sec hold   ASSESSMENT:  CLINICAL IMPRESSION: Modified pt's exercise intensity -- has been feeling a lot of soreness in bilat LEs especially since he is also still moving packing/unpacking boxes. Complains of new burning/pains in his feet/ankles which may have been precipitated by his balance exercises. Provided  gentle manual work to help ease muscle tension/soreness. Today's session focused mostly on active recovery and gentle core strengthening.   From eval: Patient is a 78 y.o. male  who was evaluated today by physical therapy due to L lumbar radiculopathy. He also has h/o progressive dementia and frequent falls.  Noted marked weakness and atrophy B LE's , most L lower leg with pronounced foot drop.  Emphasized to him today the need to utilize the FWW that he has available at home, not his cane due to his unsteady gait today.  He should benefit from skilled PT to address his gross LE weakness as well as L LE weakness, which in turn should improve his activity tolerance and reduce his falls and lower back/ radicular pain.  Of note he has melanoma R plantar foot which his wife reports will be surgically removed in 2 weeks which may affect his ability to participate in PT.  Also they are moving in 3 weeks to a new handicap accessible apartment.   OBJECTIVE IMPAIRMENTS: decreased activity tolerance, decreased balance, decreased cognition, decreased coordination, decreased endurance, decreased knowledge of condition, decreased knowledge of use of DME, difficulty walking, decreased strength, decreased safety awareness, impaired perceived functional ability, improper body mechanics, postural dysfunction, and pain.   ACTIVITY LIMITATIONS: carrying, lifting, standing, transfers, reach over head, and locomotion level  PARTICIPATION LIMITATIONS: meal prep, interpersonal relationship,  driving, shopping, and community activity  PERSONAL FACTORS: Age, Behavior pattern, Education, Fitness, Past/current experiences, Time since onset of injury/illness/exacerbation, and 1-2 comorbidities: progressive dementia, melanoma R plantar foot to be removed April 30,  are also affecting patient's functional outcome.   REHAB POTENTIAL: Fair high fall risk due to cognitive decline  CLINICAL DECISION MAKING: Evolving/moderate complexity  EVALUATION COMPLEXITY: Moderate   GOALS: Goals reviewed with patient? Yes  SHORT TERM GOALS: Target date: 2 weeks 05/10/23  I HEP Baseline: Goal status: IN PROGRESS - 05/17/23 - pt requesting further review   LONG TERM GOALS: Target date: 06/21/23   Modified Oswestry Low Back Pain Disability Questionnaire: 30 / 50 = 60.0 % improve to 40/50  Baseline:  Goal status: INITIAL  2.  Patient will be able to walk safely greater than 350' with appropriate device.   Baseline: unsafe, high fall risk with st cane Goal status: INITIAL  3.  30 sec sit to stand increase from 8 reps to 12 reps for evidence of improved endurance, coordination, strength Baseline:  Goal status: INITIAL   PLAN:  PT FREQUENCY: 2x/week  PT DURATION: 8 weeks  PLANNED INTERVENTIONS: 97110-Therapeutic exercises, 97530- Therapeutic activity, 97112- Neuromuscular re-education, 97535- Self Care, and 16109- Manual therapy.  PLAN FOR NEXT SESSION:  therex in gym on cardiovascular equipment, and progressive strengthening LE's, balance challenges; continue working on endurance   Dorthy Magnussen April Ma L Gerren Hoffmeier, PT 05/24/2023, 11:37 AM      Date of referral: 04/06/23 Referring provider: Raydell Cahill Referring diagnosis? L lumbar radiculopathy Treatment diagnosis? (if different than referring diagnosis) same  What was this (referring dx) caused by? Ongoing Issue  Lonne Roan of Condition: Recurrent (multiple episodes of < 3 months)   Laterality: Both  Current Functional Measure Score:  Back Index 30/50  Objective measurements identify impairments when they are compared to normal values, the uninvolved extremity, and prior level of function.  [x]  Yes  []  No  Objective assessment of functional ability: Severe functional limitations   Briefly describe symptoms: chronic pain B lateral lower legs , weakness progressive, constant pain lower back, regardless of position  How did symptoms start: Multiple falls, at least 6 x in last 6 months, weakness and atrophy B LE's with foot drop L  Average pain intensity:  Last 24 hours: not stated  Past week: not stated just reported constant dull pain  How often does the pt experience symptoms? Constantly  How much have the symptoms interfered with usual daily activities? Extremely  How has condition changed since care began at this facility? NA - initial visit  In general, how is the patients overall health? Fair

## 2023-05-26 ENCOUNTER — Ambulatory Visit

## 2023-05-26 DIAGNOSIS — M5416 Radiculopathy, lumbar region: Secondary | ICD-10-CM

## 2023-05-26 DIAGNOSIS — G8929 Other chronic pain: Secondary | ICD-10-CM | POA: Diagnosis not present

## 2023-05-26 DIAGNOSIS — M5459 Other low back pain: Secondary | ICD-10-CM

## 2023-05-26 DIAGNOSIS — M25561 Pain in right knee: Secondary | ICD-10-CM | POA: Diagnosis not present

## 2023-05-26 DIAGNOSIS — M533 Sacrococcygeal disorders, not elsewhere classified: Secondary | ICD-10-CM | POA: Diagnosis not present

## 2023-05-26 DIAGNOSIS — M6281 Muscle weakness (generalized): Secondary | ICD-10-CM | POA: Diagnosis not present

## 2023-05-26 NOTE — Therapy (Signed)
 OUTPATIENT PHYSICAL THERAPY TREATMENT   Patient Name: David [name] MRN: [id number] DOB:09-21-1945, 78 y.o., male Today's Date: 05/26/2023  END OF SESSION:  PT End of Session - 05/26/23 1155     Visit Number 7    Date for PT Re-Evaluation 06/21/23    Authorization Type [employer] Medicare    Authorization Time Period 04/26/23 - 06/21/23    Authorization - Visit Number 7    Authorization - Number of Visits 16    Progress Note Due on Visit 10    PT Start Time 1145    PT Stop Time 1231    PT Time Calculation (min) 46 min    Activity Tolerance Patient tolerated treatment well    Behavior During Therapy WFL for tasks assessed/performed                  Past Medical History:  Diagnosis Date   Adenoma of right adrenal gland 07/11/2002   2.4cm , noted on CT ABD   Anxiety    Arthritis of both knees 03/26/2016   Astigmatism    Back pain    BCC (basal cell carcinoma of skin)    under right eye and right ear   Blood transfusion without reported diagnosis    Cataract 09/29/2016   Depression    Diverticulitis 2009   Diverticulosis    Family history of breast cancer    Family history of colon cancer    Fatty liver    GERD (gastroesophageal reflux disease)    Gout    Hearing loss of both ears 03/26/2016   History of atrial fibrillation    x 2 none since 2020   History of chronic prostatitis    started at age 89   History of kidney stones    Hx of adenomatous colonic polyps 02/16/2022   3 diminutive - no recall   Hyperglycemia    Hyperlipidemia    Hypertension    Incomplete right bundle branch block (RBBB)    Internal hemorrhoids    Kidney lesion 06/07/2015   Right midportion, 1.1x1.1 cm hyperechoic, noted on US  ABD   LAFB (left anterior fascicular block)    Lipoma of axilla 09/2016   Liver lesion, right lobe 06/07/2015   2.5x2.4x2.4 cm hypoechoic lesion posterior aspect, noted on US  ABD   Low back pain 03/26/2016   LVH (left ventricular hypertrophy) 08/06/2017    Moderate, Noted on ECHO   Medicare annual wellness visit, subsequent 03/12/2014   OA (osteoarthritis)    Back, Hands, Neck   Obesity 11/25/2007   Qualifier: Diagnosis of  By: Elinor Guardian MD, Raenelle Bumpers    Other malaise and fatigue 03/13/2013   Premature ventricular complex    Prostate cancer (HCC) dx 2021   Renal insufficiency    Kidney removed right   Scoliosis    Upper thoracic and lumbar   Sinus bradycardia 08/06/2017   Noted on EKG   Vitamin D  deficiency    resolved   Past Surgical History:  Procedure Laterality Date   CARDIOVERSION  07/31/2017   CATARACT EXTRACTION, BILATERAL     COLON SURGERY  2009   segmental sigmoid resection   COLONOSCOPY     COLONOSCOPY WITH PROPOFOL      CYSTOSCOPY N/A 07/28/2019   Procedure: CYSTOSCOPY FLEXIBLE;  Surgeon: Marco Severs, MD;  Location: [facility];  Service: Urology;  Laterality: N/A;   CYSTOSCOPY W/ RETROGRADES Right 06/07/2018   Procedure: CYSTOSCOPY WITH RETROGRADE PYELOGRAM, uphrostogram;  Surgeon: Marco Severs,  MD;  Location: [facility];  Service: Urology;  Laterality: Right;   CYSTOSCOPY WITH URETEROSCOPY AND STENT PLACEMENT Right 02/28/2018   Procedure: CYSTOSCOPY WITH URETEROSCOPY Sallyann Crea;  Surgeon: Ottelin, Mark, MD;  Location: [facility];  Service: Urology;  Laterality: Right;   CYSTOSCOPY/URETEROSCOPY/HOLMIUM LASER/STENT PLACEMENT Right 11/29/2017   Procedure: CYSTOSCOPY/RETROGRADE/URETEROSCOPY/HOLMIUM LASER/STENT PLACEMENT;  Surgeon: Ottelin, Mark, MD;  Location: [facility];  Service: Urology;  Laterality: Right;   EYE SURGERY Bilateral 01/12/2017   cataract removal   history of blood tranfusion  age 70   HOLMIUM LASER APPLICATION Right 09/24/2017   Procedure: HOLMIUM LASER APPLICATION;  Surgeon: Ottelin, Mark, MD;  Location: [facility];  Service: Urology;  Laterality: Right;   IR BALLOON DILATION URETERAL STRICTURE RIGHT  03/14/2018   IR NEPHRO TUBE REMOV/FL  03/17/2018    IR NEPHROSTOGRAM RIGHT THRU EXISTING ACCESS  03/17/2018   IR NEPHROSTOMY EXCHANGE RIGHT  03/14/2018   IR NEPHROSTOMY EXCHANGE RIGHT  06/21/2018   IR NEPHROSTOMY PLACEMENT RIGHT  03/02/2018   IR NEPHROSTOMY PLACEMENT RIGHT  04/20/2018   IR URETERAL STENT PLACEMENT EXISTING ACCESS RIGHT  03/14/2018   NOSE SURGERY     Submucous resection age 59   NUCLEAR STRESS TEST  06/03/2009   RADIOACTIVE SEED IMPLANT N/A 07/28/2019   Procedure: RADIOACTIVE SEED IMPLANT/BRACHYTHERAPY IMPLANT;  Surgeon: Marco Severs, MD;  Location: [facility];  Service: Urology;  Laterality: N/A;  90 MINS   ROBOT ASSISTED LAPAROSCOPIC NEPHRECTOMY Right 08/04/2018   Procedure: XI ROBOTIC ASSISTED LAPAROSCOPIC NEPHRECTOMY;  Surgeon: Marco Severs, MD;  Location: [facility];  Service: Urology;  Laterality: Right;  3 HRS   ROBOT ASSISTED PYELOPLASTY Right 06/07/2018   Procedure: attempted XI ROBOTIC ASSISTED PYELOPLASTY, lysis of adhesions;  Surgeon: Marco Severs, MD;  Location: [facility];  Service: Urology;  Laterality: Right;  3 HRS   SKIN BIOPSY     SPACE OAR INSTILLATION N/A 07/28/2019   Procedure: SPACE OAR INSTILLATION;  Surgeon: Marco Severs, MD;  Location: [facility];  Service: Urology;  Laterality: N/A;   URETEROSCOPY WITH HOLMIUM LASER LITHOTRIPSY Right 09/24/2017   Procedure: CYSTOSCOPY, URETEROSCOPY WITH HOLMIUM LASER LITHOTRIPSY, STENT PLACEMENT;  Surgeon: Ottelin, Mark, MD;  Location: [facility];  Service: Urology;  Laterality: Right;   Patient Active Problem List   Diagnosis Date Noted   Gait abnormality 04/22/2023   Aortic root dilatation (HCC) 03/31/2023   Sacral pain 12/08/2022   Right shoulder pain 09/30/2022   Moderate dementia with mood disturbance (HCC) 07/12/2022   Chronic right ear pain 03/11/2022   Chronic anticoagulation 02/11/2022   CRI (chronic renal insufficiency) 10/29/2021   Abdominal pain 10/29/2021   Sun-damaged skin  07/23/2021   H/O prostate cancer 07/23/2021   Left knee pain 07/23/2021   Coronary atherosclerosis 02/11/2021   Myalgia 11/04/2020   Neck pain 11/04/2020   Stage 3a chronic kidney disease (HCC) 07/02/2020   Urinary urgency 02/12/2020   Colon cancer screening 09/26/2019   Family history of breast cancer    Family history of colon cancer    Educated about COVID-19 virus infection 08/26/2019   Prostate cancer (HCC) 04/28/2019   Pruritus 02/03/2019   LVH (left ventricular hypertrophy) 10/30/2018   Tinea versicolor 09/28/2018   Thoracic back pain 09/26/2018   Hypercalcemia 08/08/2018   Nonfunctioning kidney 08/04/2018   Arm pain 07/18/2018   Right hip pain 06/22/2018   Ureteral obstruction 06/07/2018   Recurrent nephrolithiasis 04/23/2018   Hydronephrosis 03/01/2018  Floppy eyelid syndrome of both eyes 01/24/2018   Foot pain, bilateral 12/28/2017   Kidney stone 09/18/2017   Insomnia 09/17/2017   Abnormal TSH 04/15/2017   Senile nuclear sclerosis 01/18/2017   Cataract 09/29/2016   Muscle cramp 09/29/2016   Nocturia 09/29/2016   Hyperglycemia 03/29/2016   Low back pain with radiation 03/26/2016   Cataracts, bilateral 03/26/2016   Arthritis of both knees 03/26/2016   Hearing loss 03/26/2016   Complex renal cyst 06/16/2015   History of atrial fibrillation 06/16/2015   Snoring 06/07/2015   Somnolence 06/07/2015   Preventative health care 03/07/2015   Lipoma of axilla 06/04/2014   Tinea cruris 03/20/2014   Lichen simplex chronicus 03/20/2014   Medicare annual wellness visit, subsequent 03/12/2014   Depression with anxiety 09/25/2013   Constipation 09/25/2013   Gouty arthritis of toe of left foot 08/01/2013   Skin lesion 05/27/2013   Rash 05/26/2013   Abnormal liver function 03/18/2013   IBS (irritable bowel syndrome) 03/18/2013   Other malaise and fatigue 03/13/2013   Tinea corporis 03/13/2013   Gout 12/14/2012   Diarrhea 12/14/2012   Rectal irritation 09/18/2012    Diverticulosis    BCC (basal cell carcinoma of skin)    Scoliosis 01/19/2011   Hyperlipidemia, mixed 03/11/2010   Essential hypertension, benign 03/11/2010   Allergic rhinitis 03/11/2010   Arthralgia 03/11/2010   Obesity 11/25/2007   BENIGN NEOPLASM OF ADRENAL GLAND 09/22/2007   Fatty liver disease, nonalcoholic 09/22/2007   Osteoarthritis 09/22/2007   SPONDYLOSIS, LUMBAR 09/22/2007   GERD 04/12/2007    PCP: Neda Balk., MD  REFERRING PROVIDER: Raydell Cahill DO  REFERRING DIAG: L lumbar radiculopathy  THERAPY DIAG:  Other low back pain  Muscle weakness (generalized)  Left lumbar radiculopathy  RATIONALE FOR EVALUATION AND TREATMENT: Rehabilitation  ONSET DATE: chronic  NEXT MD VISIT: 06/24/23   SUBJECTIVE:                                                                                                                                                                                           SUBJECTIVE STATEMENT: Pt reports he tripped yesterday and hit his head and fell to the ground but was able to get himself up. He does not report any further injury.  PERTINENT HISTORY:  HOH, Referred by sports medicine MD,   PAIN:  Are you having pain? Yes: NPRS scale: 1-6/10  Pain location: L foot and lower Pain description: sharp, intermittent stabbing  Aggravating factors: lifting his leg, extending great toe Relieving factors: nothing, rest    Are you having pain? Yes: NPRS scale: 5/10 Pain location: R LE Pain description: sharp  Aggravating factors: stiffens up with sitting  Relieving factors: lying flat in bed    PRECAUTIONS: Fall  RED FLAGS: None   WEIGHT BEARING RESTRICTIONS: No  FALLS:  Has patient fallen in last 6 months? Yes. Number of falls 7  LIVING ENVIRONMENT: Lives with: lives with their family and lives with their spouse Lives in: House/apartment Stairs: moving to an apartment with wife in 2 weeks that will be one level Has following  equipment at home: [employer] - 2 wheeled and [employer] - 4 wheeled  OCCUPATION: retired  PLOF: Independent  PATIENT GOALS: pt /wife want to improve pain   OBJECTIVE:  Note: Objective measures were completed at Evaluation unless otherwise noted.  DIAGNOSTIC FINDINGS:  MRI completed lumbar but results not in  PATIENT SURVEYS:  Modified Oswestry Modified Oswestry Low Back Pain Disability Questionnaire: 30 / 50 = 60.0 %   COGNITION: Overall cognitive status: Difficulty to assess due to: baseline not available but wife / MD notes report dementia, hallucinations     SENSATION: WFL  MUSCLE LENGTH: Hamstrings: Right -35 deg; Left -35 deg Thomas test: Right na deg; Left na deg  POSTURE: rounded shoulders, forward head, decreased lumbar lordosis, increased thoracic kyphosis, and posterior pelvic tilt  PALPATION: Non tender with palpation B hips, lumbar region  LUMBAR ROM: unable to assess Lumbar ROM due to balance and poor safety awareness   LOWER EXTREMITY ROM:   wfl B without deficits.   LOWER EXTREMITY MMT:    MMT Right eval Left eval  Hip flexion 4- 4-  Hip extension 4- 4-  Hip abduction 4- 4-  Hip adduction 4- 4-  Hip internal rotation    Hip external rotation    Knee flexion    Knee extension 4- 3+  Ankle dorsiflexion 4 3-  Ankle plantarflexion 3+ 3+  Ankle inversion    Ankle eversion     (Blank rows = not tested)  LUMBAR SPECIAL TESTS:  Straight leg raise test: Positive and Slump test: Positive, both on L  FUNCTIONAL TESTS:  30 seconds chair stand test 8 reps   GAIT: Distance walked: 6' x 2 Assistive device utilized: Single point cane Level of assistance: CGA Comments: very unsteady, scissoring and sway, L foot drop noted  TREATMENT DATE:  05/26/23 Nustep L4 x 6 min UEs/LEs Passive stretch for HS, piriformis and SKTC BLE Supine LTR 3x10" B Supine hip ADD stretch ( bent knee fallout) 3x10" Seated trunk rotation x 5 B Seated hamstring + ankle/DF x 10  each LE  05/24/23 Nustep L4 x 6 min UEs/LEs Seated calf stretch with strap 2x30" Seated hamstring stretch x 30" Seated figure 4 stretch 2x 30" Supine hip flexor/quad stretch x 30" IASTM with massage roller bilat quads, hamstrings, calves  Supine clamshell 2x10 Supine LTR 2x10" Supine PPT x20   05/17/23 THERAPEUTIC EXERCISE: To improve strength, endurance, ROM, and flexibility.  Demonstration, verbal and tactile cues throughout for technique.  NuStep - L5 x 6 min - UEs/LEs Seated hip hinge HS stretch x 30" bil Seated figure-4 hip piriformis stretch x 30" bil Seated L DF AROM x 10 Standing heel-toe raises 2 x 10 - limited toe lift on L Standing L gastroc stretch 2 x 30"  NEUROMUSCULAR RE-EDUCATION: To improve balance, coordination, kinesthesia, posture, proprioception, and reduce fall risk.  Standing hip ABD with looped RTB at distal thighs x 10 - cues to maintain upright posture and avoid trunk lean to lift leg Standing hip extension with looped RTB at distal thighs  x 10 - cues to maintain upright posture and avoid trunk flexion to lift leg Standing hip flexion march with looped RTB at distal thighs x 10  B tandem stance with intermittent counter support 2 x 30" SLS with counter support 2 x 10-20" bil   05/11/23 THERAPEUTIC EXERCISE: To improve strength, endurance, ROM, and flexibility.  Demonstration, verbal and tactile cues throughout for technique.  NuStep - L5 x 6 min - UEs/Les Seated LAQ 2lb x 10 B Seated HS curls RTB x 10 B Seated toe raises x 10 B Standing heel-toe raises 2 x 10 - limited toe lift on L Standing L gastroc stretch x 30"  NEUROMUSCULAR RE-EDUCATION: To improve balance, coordination, kinesthesia, posture, proprioception, and reduce fall risk. Standing hip ABD with looped RTB at distal thighs 2x5 - cues to avoid hip hike on L side Standing hip extension with looped RTB at distal thighs x 10 - cues to maintain upright posture  Standing hip flexion march with  looped RTB at distal thighs x 10  Seated ab sets 10x5"   05/07/23 THERAPEUTIC EXERCISE: To improve strength, endurance, ROM, and flexibility.  Demonstration, verbal and tactile cues throughout for technique.  NuStep - L5 x 6 min - UEs/LEs Seated hip hinge HS stretch x 30" bil Seated figure-4 hip piriformis stretch x 30" bil Standing heel-toe raises 2 x 10 - limited toe lift on L Standing L gastroc stretch x 30" Attempted YTB resisted L DF in sitting x 5 but minimal toe lift  NEUROMUSCULAR RE-EDUCATION: To improve balance, coordination, kinesthesia, posture, proprioception, and reduce fall risk. Standing hip ABD with looped RTB at distal thighs x 10 - cues to maintain upright posture and avoid trunk lean to lift leg Standing hip extension with looped RTB at distal thighs x 10 - cues to maintain upright posture and avoid trunk flexion to lift leg Standing hip flexion march with looped RTB at distal thighs x 10  Standing hip flexion SLR with looped RTB at distal thighs x 10  B side-stepping 2 x 10 feet along with looped RTB at distal thighs B tandem stance with intermittent counter support 2 x 30" SLS with counter support 3 x 10" bil   PATIENT EDUCATION:  Education details: HEP review   Person educated: Patient Education method: Programmer, multimedia, Demonstration, and Verbal cues Education comprehension: verbalized understanding, returned demonstration, verbal cues required, and needs further education  HOME EXERCISE PROGRAM: Access Code: [email or web address] URL: [email or web address] Date: 05/07/2023 Prepared by: Felecia Hopper  Exercises - Seated Hamstring Stretch  - 1 x daily - 7 x weekly - 2 sets - 30 sec hold - Seated Figure 4 Piriformis Stretch  - 1 x daily - 7 x weekly - 2 sets - 30 sec hold - Heel Toe Raises with Counter Support  - 1 x daily - 7 x weekly - 2 sets - 10 reps - Standing Hip Extension with Counter Support  - 1 x daily - 7 x weekly - 2-3 sets - 10 reps - Standing Hip  Flexion with Counter Support  - 1 x daily - 7 x weekly - 2-3 sets - 10 reps - Standing Hip Abduction with Resistance at Thighs  - 1 x daily - 7 x weekly - 2 sets - 10 reps - 3 sec hold - Standing Tandem Balance with Counter Support  - 1 x daily - 7 x weekly - 3 reps - 20-30 sec hold - Standing Single Leg Stance with Counter Support  - 1 x  daily - 7 x weekly - 3-5 reps - 10 sec hold   ASSESSMENT:  CLINICAL IMPRESSION: Modified pt's exercise intensity -- has been feeling a lot of soreness in bilat LEs especially since he is also still moving packing/unpacking boxes. Provided gentle manual stretching to help ease muscle tension/soreness. Today's session focused mostly on active recovery, stretching and mobility.   From eval: Patient is a 78 y.o. male  who was evaluated today by physical therapy due to L lumbar radiculopathy. He also has h/o progressive dementia and frequent falls.  Noted marked weakness and atrophy B LE's , most L lower leg with pronounced foot drop.  Emphasized to him today the need to utilize the FWW that he has available at home, not his cane due to his unsteady gait today.  He should benefit from skilled PT to address his gross LE weakness as well as L LE weakness, which in turn should improve his activity tolerance and reduce his falls and lower back/ radicular pain.  Of note he has melanoma R plantar foot which his wife reports will be surgically removed in 2 weeks which may affect his ability to participate in PT.  Also they are moving in 3 weeks to a new handicap accessible apartment.   OBJECTIVE IMPAIRMENTS: decreased activity tolerance, decreased balance, decreased cognition, decreased coordination, decreased endurance, decreased knowledge of condition, decreased knowledge of use of DME, difficulty walking, decreased strength, decreased safety awareness, impaired perceived functional ability, improper body mechanics, postural dysfunction, and pain.   ACTIVITY LIMITATIONS:  carrying, lifting, standing, transfers, reach over head, and locomotion level  PARTICIPATION LIMITATIONS: meal prep, interpersonal relationship, driving, shopping, and community activity  PERSONAL FACTORS: Age, Behavior pattern, Education, Fitness, Past/current experiences, Time since onset of injury/illness/exacerbation, and 1-2 comorbidities: progressive dementia, melanoma R plantar foot to be removed April 30,  are also affecting patient's functional outcome.   REHAB POTENTIAL: Fair high fall risk due to cognitive decline  CLINICAL DECISION MAKING: Evolving/moderate complexity  EVALUATION COMPLEXITY: Moderate   GOALS: Goals reviewed with patient? Yes  SHORT TERM GOALS: Target date: 2 weeks 05/10/23  I HEP Baseline: Goal status: IN PROGRESS - 05/17/23 - pt requesting further review   LONG TERM GOALS: Target date: 06/21/23   Modified Oswestry Low Back Pain Disability Questionnaire: 30 / 50 = 60.0 % improve to 40/50  Baseline:  Goal status: INITIAL  2.  Patient will be able to walk safely greater than 350' with appropriate device.   Baseline: unsafe, high fall risk with st cane Goal status: INITIAL  3.  30 sec sit to stand increase from 8 reps to 12 reps for evidence of improved endurance, coordination, strength Baseline:  Goal status: INITIAL   PLAN:  PT FREQUENCY: 2x/week  PT DURATION: 8 weeks  PLANNED INTERVENTIONS: 97110-Therapeutic exercises, 97530- Therapeutic activity, 97112- Neuromuscular re-education, 97535- Self Care, and 16109- Manual therapy.  PLAN FOR NEXT SESSION:  continue to work on recovery and light stretching if patient is still moving; therex in gym on cardiovascular equipment, and progressive strengthening LE's, balance challenges; continue working on endurance   Rudolph Dobler L Sian Joles, PTA 05/26/2023, 12:37 PM      Date of referral: 04/06/23 Referring provider: Raydell Cahill Referring diagnosis? L lumbar radiculopathy Treatment diagnosis? (if  different than referring diagnosis) same  What was this (referring dx) caused by? Ongoing Issue  Lonne Roan of Condition: Recurrent (multiple episodes of < 3 months)   Laterality: Both  Current Functional Measure Score: Back Index 30/50  Objective measurements  identify impairments when they are compared to normal values, the uninvolved extremity, and prior level of function.  [x]  Yes  []  No  Objective assessment of functional ability: Severe functional limitations   Briefly describe symptoms: chronic pain B lateral lower legs , weakness progressive, constant pain lower back, regardless of position  How did symptoms start: Multiple falls, at least 6 x in last 6 months, weakness and atrophy B LE's with foot drop L  Average pain intensity:  Last 24 hours: not stated  Past week: not stated just reported constant dull pain  How often does the pt experience symptoms? Constantly  How much have the symptoms interfered with usual daily activities? Extremely  How has condition changed since care began at this facility? NA - initial visit  In general, how is the patients overall health? Fair

## 2023-05-28 DIAGNOSIS — C4371 Malignant melanoma of right lower limb, including hip: Secondary | ICD-10-CM | POA: Diagnosis not present

## 2023-05-31 ENCOUNTER — Ambulatory Visit: Admitting: Physical Therapy

## 2023-05-31 ENCOUNTER — Other Ambulatory Visit: Payer: Self-pay | Admitting: Family Medicine

## 2023-05-31 DIAGNOSIS — M5416 Radiculopathy, lumbar region: Secondary | ICD-10-CM

## 2023-05-31 DIAGNOSIS — M6281 Muscle weakness (generalized): Secondary | ICD-10-CM

## 2023-05-31 DIAGNOSIS — M533 Sacrococcygeal disorders, not elsewhere classified: Secondary | ICD-10-CM | POA: Diagnosis not present

## 2023-05-31 DIAGNOSIS — M5459 Other low back pain: Secondary | ICD-10-CM

## 2023-05-31 DIAGNOSIS — M25561 Pain in right knee: Secondary | ICD-10-CM | POA: Diagnosis not present

## 2023-05-31 DIAGNOSIS — G8929 Other chronic pain: Secondary | ICD-10-CM | POA: Diagnosis not present

## 2023-05-31 NOTE — Therapy (Signed)
 OUTPATIENT PHYSICAL THERAPY TREATMENT   Patient Name: David [name] MRN: [id number] DOB:1945/03/12, 78 y.o., male Today's Date: 05/31/2023  END OF SESSION:  PT End of Session - 05/31/23 1154     Visit Number 8    Date for PT Re-Evaluation 06/21/23    Authorization Type [employer] Medicare    Authorization Time Period 04/26/23 - 06/21/23    Authorization - Visit Number 8    Authorization - Number of Visits 16    Progress Note Due on Visit 10    PT Start Time 1150    PT Stop Time 1230    PT Time Calculation (min) 40 min    Activity Tolerance Patient tolerated treatment well    Behavior During Therapy [facility] for tasks assessed/performed                   Past Medical History:  Diagnosis Date   Adenoma of right adrenal gland 07/11/2002   2.4cm , noted on CT ABD   Anxiety    Arthritis of both knees 03/26/2016   Astigmatism    Back pain    BCC (basal cell carcinoma of skin)    under right eye and right ear   Blood transfusion without reported diagnosis    Cataract 09/29/2016   Depression    Diverticulitis 2009   Diverticulosis    Family history of breast cancer    Family history of colon cancer    Fatty liver    GERD (gastroesophageal reflux disease)    Gout    Hearing loss of both ears 03/26/2016   History of atrial fibrillation    x 2 none since 2020   History of chronic prostatitis    started at age 33   History of kidney stones    Hx of adenomatous colonic polyps 02/16/2022   3 diminutive - no recall   Hyperglycemia    Hyperlipidemia    Hypertension    Incomplete right bundle branch block (RBBB)    Internal hemorrhoids    Kidney lesion 06/07/2015   Right midportion, 1.1x1.1 cm hyperechoic, noted on US  ABD   LAFB (left anterior fascicular block)    Lipoma of axilla 09/2016   Liver lesion, right lobe 06/07/2015   2.5x2.4x2.4 cm hypoechoic lesion posterior aspect, noted on US  ABD   Low back pain 03/26/2016   LVH (left ventricular hypertrophy) 08/06/2017    Moderate, Noted on ECHO   Medicare annual wellness visit, subsequent 03/12/2014   OA (osteoarthritis)    Back, Hands, Neck   Obesity 11/25/2007   Qualifier: Diagnosis of  By: Elinor Guardian MD, Raenelle Bumpers    Other malaise and fatigue 03/13/2013   Premature ventricular complex    Prostate cancer (HCC) dx 2021   Renal insufficiency    Kidney removed right   Scoliosis    Upper thoracic and lumbar   Sinus bradycardia 08/06/2017   Noted on EKG   Vitamin D  deficiency    resolved   Past Surgical History:  Procedure Laterality Date   CARDIOVERSION  07/31/2017   CATARACT EXTRACTION, BILATERAL     COLON SURGERY  2009   segmental sigmoid resection   COLONOSCOPY     COLONOSCOPY WITH PROPOFOL      CYSTOSCOPY N/A 07/28/2019   Procedure: CYSTOSCOPY FLEXIBLE;  Surgeon: Marco Severs, MD;  Location: [facility];  Service: Urology;  Laterality: N/A;   CYSTOSCOPY W/ RETROGRADES Right 06/07/2018   Procedure: CYSTOSCOPY WITH RETROGRADE PYELOGRAM, uphrostogram;  Surgeon: Johnie Nailer  L, MD;  Location: [facility];  Service: Urology;  Laterality: Right;   CYSTOSCOPY WITH URETEROSCOPY AND STENT PLACEMENT Right 02/28/2018   Procedure: CYSTOSCOPY WITH URETEROSCOPY Sallyann Crea;  Surgeon: Ottelin, Mark, MD;  Location: [facility];  Service: Urology;  Laterality: Right;   CYSTOSCOPY/URETEROSCOPY/HOLMIUM LASER/STENT PLACEMENT Right 11/29/2017   Procedure: CYSTOSCOPY/RETROGRADE/URETEROSCOPY/HOLMIUM LASER/STENT PLACEMENT;  Surgeon: Ottelin, Mark, MD;  Location: [facility];  Service: Urology;  Laterality: Right;   EYE SURGERY Bilateral 01/12/2017   cataract removal   history of blood tranfusion  age 26   HOLMIUM LASER APPLICATION Right 09/24/2017   Procedure: HOLMIUM LASER APPLICATION;  Surgeon: Ottelin, Mark, MD;  Location: [facility];  Service: Urology;  Laterality: Right;   IR BALLOON DILATION URETERAL STRICTURE RIGHT  03/14/2018   IR NEPHRO TUBE REMOV/FL  03/17/2018    IR NEPHROSTOGRAM RIGHT THRU EXISTING ACCESS  03/17/2018   IR NEPHROSTOMY EXCHANGE RIGHT  03/14/2018   IR NEPHROSTOMY EXCHANGE RIGHT  06/21/2018   IR NEPHROSTOMY PLACEMENT RIGHT  03/02/2018   IR NEPHROSTOMY PLACEMENT RIGHT  04/20/2018   IR URETERAL STENT PLACEMENT EXISTING ACCESS RIGHT  03/14/2018   NOSE SURGERY     Submucous resection age 27   NUCLEAR STRESS TEST  06/03/2009   RADIOACTIVE SEED IMPLANT N/A 07/28/2019   Procedure: RADIOACTIVE SEED IMPLANT/BRACHYTHERAPY IMPLANT;  Surgeon: Marco Severs, MD;  Location: [facility];  Service: Urology;  Laterality: N/A;  90 MINS   ROBOT ASSISTED LAPAROSCOPIC NEPHRECTOMY Right 08/04/2018   Procedure: XI ROBOTIC ASSISTED LAPAROSCOPIC NEPHRECTOMY;  Surgeon: Marco Severs, MD;  Location: [facility];  Service: Urology;  Laterality: Right;  3 HRS   ROBOT ASSISTED PYELOPLASTY Right 06/07/2018   Procedure: attempted XI ROBOTIC ASSISTED PYELOPLASTY, lysis of adhesions;  Surgeon: Marco Severs, MD;  Location: [facility];  Service: Urology;  Laterality: Right;  3 HRS   SKIN BIOPSY     SPACE OAR INSTILLATION N/A 07/28/2019   Procedure: SPACE OAR INSTILLATION;  Surgeon: Marco Severs, MD;  Location: [facility];  Service: Urology;  Laterality: N/A;   URETEROSCOPY WITH HOLMIUM LASER LITHOTRIPSY Right 09/24/2017   Procedure: CYSTOSCOPY, URETEROSCOPY WITH HOLMIUM LASER LITHOTRIPSY, STENT PLACEMENT;  Surgeon: Ottelin, Mark, MD;  Location: [facility];  Service: Urology;  Laterality: Right;   Patient Active Problem List   Diagnosis Date Noted   Gait abnormality 04/22/2023   Aortic root dilatation (HCC) 03/31/2023   Sacral pain 12/08/2022   Right shoulder pain 09/30/2022   Moderate dementia with mood disturbance (HCC) 07/12/2022   Chronic right ear pain 03/11/2022   Chronic anticoagulation 02/11/2022   CRI (chronic renal insufficiency) 10/29/2021   Abdominal pain 10/29/2021   Sun-damaged skin  07/23/2021   H/O prostate cancer 07/23/2021   Left knee pain 07/23/2021   Coronary atherosclerosis 02/11/2021   Myalgia 11/04/2020   Neck pain 11/04/2020   Stage 3a chronic kidney disease (HCC) 07/02/2020   Urinary urgency 02/12/2020   Colon cancer screening 09/26/2019   Family history of breast cancer    Family history of colon cancer    Educated about COVID-19 virus infection 08/26/2019   Prostate cancer (HCC) 04/28/2019   Pruritus 02/03/2019   LVH (left ventricular hypertrophy) 10/30/2018   Tinea versicolor 09/28/2018   Thoracic back pain 09/26/2018   Hypercalcemia 08/08/2018   Nonfunctioning kidney 08/04/2018   Arm pain 07/18/2018   Right hip pain 06/22/2018   Ureteral obstruction 06/07/2018   Recurrent nephrolithiasis 04/23/2018   Hydronephrosis 03/01/2018  Floppy eyelid syndrome of both eyes 01/24/2018   Foot pain, bilateral 12/28/2017   Kidney stone 09/18/2017   Insomnia 09/17/2017   Abnormal TSH 04/15/2017   Senile nuclear sclerosis 01/18/2017   Cataract 09/29/2016   Muscle cramp 09/29/2016   Nocturia 09/29/2016   Hyperglycemia 03/29/2016   Low back pain with radiation 03/26/2016   Cataracts, bilateral 03/26/2016   Arthritis of both knees 03/26/2016   Hearing loss 03/26/2016   Complex renal cyst 06/16/2015   History of atrial fibrillation 06/16/2015   Snoring 06/07/2015   Somnolence 06/07/2015   Preventative health care 03/07/2015   Lipoma of axilla 06/04/2014   Tinea cruris 03/20/2014   Lichen simplex chronicus 03/20/2014   Medicare annual wellness visit, subsequent 03/12/2014   Depression with anxiety 09/25/2013   Constipation 09/25/2013   Gouty arthritis of toe of left foot 08/01/2013   Skin lesion 05/27/2013   Rash 05/26/2013   Abnormal liver function 03/18/2013   IBS (irritable bowel syndrome) 03/18/2013   Other malaise and fatigue 03/13/2013   Tinea corporis 03/13/2013   Gout 12/14/2012   Diarrhea 12/14/2012   Rectal irritation 09/18/2012    Diverticulosis    BCC (basal cell carcinoma of skin)    Scoliosis 01/19/2011   Hyperlipidemia, mixed 03/11/2010   Essential hypertension, benign 03/11/2010   Allergic rhinitis 03/11/2010   Arthralgia 03/11/2010   Obesity 11/25/2007   BENIGN NEOPLASM OF ADRENAL GLAND 09/22/2007   Fatty liver disease, nonalcoholic 09/22/2007   Osteoarthritis 09/22/2007   SPONDYLOSIS, LUMBAR 09/22/2007   GERD 04/12/2007    PCP: Neda Balk., MD  REFERRING PROVIDER: Raydell Cahill DO  REFERRING DIAG: L lumbar radiculopathy  THERAPY DIAG:  No diagnosis found.  RATIONALE FOR EVALUATION AND TREATMENT: Rehabilitation  ONSET DATE: chronic  NEXT MD VISIT: 06/24/23   SUBJECTIVE:                                                                                                                                                                                           SUBJECTIVE STATEMENT: Pt states he fell again while walking side ways -- was not using his RW. Pt reports he has bruises on both hips.   PERTINENT HISTORY:  HOH, Referred by sports medicine MD,   PAIN:  Are you having pain? Yes: NPRS scale: 1-6/10  Pain location: L foot and lower Pain description: sharp, intermittent stabbing  Aggravating factors: lifting his leg, extending great toe Relieving factors: nothing, rest    Are you having pain? Yes: NPRS scale: 5/10 Pain location: R LE Pain description: sharp  Aggravating factors: stiffens up with sitting  Relieving factors: lying flat in  bed    PRECAUTIONS: Fall  RED FLAGS: None   WEIGHT BEARING RESTRICTIONS: No  FALLS:  Has patient fallen in last 6 months? Yes. Number of falls 7  LIVING ENVIRONMENT: Lives with: lives with their family and lives with their spouse Lives in: House/apartment Stairs: moving to an apartment with wife in 2 weeks that will be one level Has following equipment at home: [employer] - 2 wheeled and [employer] - 4 wheeled  OCCUPATION: retired  PLOF:  Independent  PATIENT GOALS: pt /wife want to improve pain   OBJECTIVE:  Note: Objective measures were completed at Evaluation unless otherwise noted.  DIAGNOSTIC FINDINGS:  MRI completed lumbar but results not in  PATIENT SURVEYS:  Modified Oswestry Modified Oswestry Low Back Pain Disability Questionnaire: 30 / 50 = 60.0 %   COGNITION: Overall cognitive status: Difficulty to assess due to: baseline not available but wife / MD notes report dementia, hallucinations     SENSATION: WFL  MUSCLE LENGTH: Hamstrings: Right -35 deg; Left -35 deg Thomas test: Right na deg; Left na deg  POSTURE: rounded shoulders, forward head, decreased lumbar lordosis, increased thoracic kyphosis, and posterior pelvic tilt  PALPATION: Non tender with palpation B hips, lumbar region  LUMBAR ROM: unable to assess Lumbar ROM due to balance and poor safety awareness   LOWER EXTREMITY ROM:   wfl B without deficits.   LOWER EXTREMITY MMT:    MMT Right eval Left eval  Hip flexion 4- 4-  Hip extension 4- 4-  Hip abduction 4- 4-  Hip adduction 4- 4-  Hip internal rotation    Hip external rotation    Knee flexion    Knee extension 4- 3+  Ankle dorsiflexion 4 3-  Ankle plantarflexion 3+ 3+  Ankle inversion    Ankle eversion     (Blank rows = not tested)  LUMBAR SPECIAL TESTS:  Straight leg raise test: Positive and Slump test: Positive, both on L  FUNCTIONAL TESTS:  30 seconds chair stand test 8 reps   GAIT: Distance walked: 16' x 2 Assistive device utilized: Single point cane Level of assistance: CGA Comments: very unsteady, scissoring and sway, L foot drop noted  TREATMENT DATE:  05/31/23 Nustep L4 x 6 min  Ankle DF seated AROM x10 Ankle talar PAs MWM for DF x10 Amb x 2 laps with SPC and dorsiflexion assist with black TB working on improving ankle DF Supine clamshell green TB 3x10 Supine LTR 3x30" Supine bridge 2x10 Supine SLR 2x10   05/26/23 Nustep L4 x 6 min UEs/LEs Passive  stretch for HS, piriformis and SKTC BLE Supine LTR 3x10" B Supine hip ADD stretch ( bent knee fallout) 3x10" Seated trunk rotation x 5 B Seated hamstring + ankle/DF x 10 each LE  05/24/23 Nustep L4 x 6 min UEs/LEs Seated calf stretch with strap 2x30" Seated hamstring stretch x 30" Seated figure 4 stretch 2x 30" Supine hip flexor/quad stretch x 30" IASTM with massage roller bilat quads, hamstrings, calves  Supine clamshell 2x10 Supine LTR 2x10" Supine PPT x20   05/17/23 THERAPEUTIC EXERCISE: To improve strength, endurance, ROM, and flexibility.  Demonstration, verbal and tactile cues throughout for technique.  NuStep - L5 x 6 min - UEs/LEs Seated hip hinge HS stretch x 30" bil Seated figure-4 hip piriformis stretch x 30" bil Seated L DF AROM x 10 Standing heel-toe raises 2 x 10 - limited toe lift on L Standing L gastroc stretch 2 x 30"  NEUROMUSCULAR RE-EDUCATION: To improve balance, coordination, kinesthesia,  posture, proprioception, and reduce fall risk.  Standing hip ABD with looped RTB at distal thighs x 10 - cues to maintain upright posture and avoid trunk lean to lift leg Standing hip extension with looped RTB at distal thighs x 10 - cues to maintain upright posture and avoid trunk flexion to lift leg Standing hip flexion march with looped RTB at distal thighs x 10  B tandem stance with intermittent counter support 2 x 30" SLS with counter support 2 x 10-20" bil   05/11/23 THERAPEUTIC EXERCISE: To improve strength, endurance, ROM, and flexibility.  Demonstration, verbal and tactile cues throughout for technique.  NuStep - L5 x 6 min - UEs/Les Seated LAQ 2lb x 10 B Seated HS curls RTB x 10 B Seated toe raises x 10 B Standing heel-toe raises 2 x 10 - limited toe lift on L Standing L gastroc stretch x 30"  NEUROMUSCULAR RE-EDUCATION: To improve balance, coordination, kinesthesia, posture, proprioception, and reduce fall risk. Standing hip ABD with looped RTB at distal  thighs 2x5 - cues to avoid hip hike on L side Standing hip extension with looped RTB at distal thighs x 10 - cues to maintain upright posture  Standing hip flexion march with looped RTB at distal thighs x 10  Seated ab sets 10x5"   05/07/23 THERAPEUTIC EXERCISE: To improve strength, endurance, ROM, and flexibility.  Demonstration, verbal and tactile cues throughout for technique.  NuStep - L5 x 6 min - UEs/LEs Seated hip hinge HS stretch x 30" bil Seated figure-4 hip piriformis stretch x 30" bil Standing heel-toe raises 2 x 10 - limited toe lift on L Standing L gastroc stretch x 30" Attempted YTB resisted L DF in sitting x 5 but minimal toe lift  NEUROMUSCULAR RE-EDUCATION: To improve balance, coordination, kinesthesia, posture, proprioception, and reduce fall risk. Standing hip ABD with looped RTB at distal thighs x 10 - cues to maintain upright posture and avoid trunk lean to lift leg Standing hip extension with looped RTB at distal thighs x 10 - cues to maintain upright posture and avoid trunk flexion to lift leg Standing hip flexion march with looped RTB at distal thighs x 10  Standing hip flexion SLR with looped RTB at distal thighs x 10  B side-stepping 2 x 10 feet along with looped RTB at distal thighs B tandem stance with intermittent counter support 2 x 30" SLS with counter support 3 x 10" bil   PATIENT EDUCATION:  Education details: HEP review   Person educated: Patient Education method: Programmer, multimedia, Demonstration, and Verbal cues Education comprehension: verbalized understanding, returned demonstration, verbal cues required, and needs further education  HOME EXERCISE PROGRAM: Access Code: [email or web address] URL: [email or web address] Date: 05/07/2023 Prepared by: Felecia Hopper  Exercises - Seated Hamstring Stretch  - 1 x daily - 7 x weekly - 2 sets - 30 sec hold - Seated Figure 4 Piriformis Stretch  - 1 x daily - 7 x weekly - 2 sets - 30 sec hold - Heel Toe Raises  with Counter Support  - 1 x daily - 7 x weekly - 2 sets - 10 reps - Standing Hip Extension with Counter Support  - 1 x daily - 7 x weekly - 2-3 sets - 10 reps - Standing Hip Flexion with Counter Support  - 1 x daily - 7 x weekly - 2-3 sets - 10 reps - Standing Hip Abduction with Resistance at Thighs  - 1 x daily - 7 x weekly - 2 sets - 10  reps - 3 sec hold - Standing Tandem Balance with Counter Support  - 1 x daily - 7 x weekly - 3 reps - 20-30 sec hold - Standing Single Leg Stance with Counter Support  - 1 x daily - 7 x weekly - 3-5 reps - 10 sec hold   ASSESSMENT:  CLINICAL IMPRESSION: Performed more strengthening exercises in supine this session with pt reporting feeling good performing them. Pt with another recent fall; however, pt remains noncompliant to using RW. Discussed using a foot drop brace today with pt reporting feeling and looking more stable ambulating when having dorsiflexion assist. Demos more scissor gait pattern with fatigue.   From eval: Patient is a 78 y.o. male  who was evaluated today by physical therapy due to L lumbar radiculopathy. He also has h/o progressive dementia and frequent falls.  Noted marked weakness and atrophy B LE's , most L lower leg with pronounced foot drop.  Emphasized to him today the need to utilize the FWW that he has available at home, not his cane due to his unsteady gait today.  He should benefit from skilled PT to address his gross LE weakness as well as L LE weakness, which in turn should improve his activity tolerance and reduce his falls and lower back/ radicular pain.  Of note he has melanoma R plantar foot which his wife reports will be surgically removed in 2 weeks which may affect his ability to participate in PT.  Also they are moving in 3 weeks to a new handicap accessible apartment.   OBJECTIVE IMPAIRMENTS: decreased activity tolerance, decreased balance, decreased cognition, decreased coordination, decreased endurance, decreased knowledge  of condition, decreased knowledge of use of DME, difficulty walking, decreased strength, decreased safety awareness, impaired perceived functional ability, improper body mechanics, postural dysfunction, and pain.   ACTIVITY LIMITATIONS: carrying, lifting, standing, transfers, reach over head, and locomotion level  PARTICIPATION LIMITATIONS: meal prep, interpersonal relationship, driving, shopping, and community activity  PERSONAL FACTORS: Age, Behavior pattern, Education, Fitness, Past/current experiences, Time since onset of injury/illness/exacerbation, and 1-2 comorbidities: progressive dementia, melanoma R plantar foot to be removed April 30,  are also affecting patient's functional outcome.   REHAB POTENTIAL: Fair high fall risk due to cognitive decline  CLINICAL DECISION MAKING: Evolving/moderate complexity  EVALUATION COMPLEXITY: Moderate   GOALS: Goals reviewed with patient? Yes  SHORT TERM GOALS: Target date: 2 weeks 05/10/23  I HEP Baseline: Goal status: IN PROGRESS - 05/17/23 - pt requesting further review   LONG TERM GOALS: Target date: 06/21/23   Modified Oswestry Low Back Pain Disability Questionnaire: 30 / 50 = 60.0 % improve to 40/50  Baseline:  Goal status: INITIAL  2.  Patient will be able to walk safely greater than 350' with appropriate device.   Baseline: unsafe, high fall risk with st cane Goal status: INITIAL  3.  30 sec sit to stand increase from 8 reps to 12 reps for evidence of improved endurance, coordination, strength Baseline:  Goal status: INITIAL   PLAN:  PT FREQUENCY: 2x/week  PT DURATION: 8 weeks  PLANNED INTERVENTIONS: 97110-Therapeutic exercises, 97530- Therapeutic activity, 97112- Neuromuscular re-education, 97535- Self Care, and 09811- Manual therapy.  PLAN FOR NEXT SESSION: Any follow up for the foot up brace? therex in gym on cardiovascular equipment, and progressive strengthening LE's, balance challenges; continue working on  endurance   Sophronia Varney April Larri Ply [place], PT 05/31/2023, 11:55 AM      Date of referral: 04/06/23 Referring provider: Raydell Cahill Referring diagnosis?  L lumbar radiculopathy Treatment diagnosis? (if different than referring diagnosis) same  What was this (referring dx) caused by? Ongoing Issue  Lonne Roan of Condition: Recurrent (multiple episodes of < 3 months)   Laterality: Both  Current Functional Measure Score: Back Index 30/50  Objective measurements identify impairments when they are compared to normal values, the uninvolved extremity, and prior level of function.  [x]  Yes  []  No  Objective assessment of functional ability: Severe functional limitations   Briefly describe symptoms: chronic pain B lateral lower legs , weakness progressive, constant pain lower back, regardless of position  How did symptoms start: Multiple falls, at least 6 x in last 6 months, weakness and atrophy B LE's with foot drop L  Average pain intensity:  Last 24 hours: not stated  Past week: not stated just reported constant dull pain  How often does the pt experience symptoms? Constantly  How much have the symptoms interfered with usual daily activities? Extremely  How has condition changed since care began at this facility? NA - initial visit  In general, how is the patients overall health? Fair

## 2023-06-01 NOTE — Telephone Encounter (Signed)
 Requesting: Ativan  1 MG Contract: 12/08/22 UDS: 12/08/22 Last Visit: 03/25/2023 Next Visit: 06/24/2023 Last Refill: 04/13/23  Please Advise

## 2023-06-02 ENCOUNTER — Ambulatory Visit: Admitting: Physical Therapy

## 2023-06-02 DIAGNOSIS — M6281 Muscle weakness (generalized): Secondary | ICD-10-CM | POA: Diagnosis not present

## 2023-06-02 DIAGNOSIS — I4891 Unspecified atrial fibrillation: Secondary | ICD-10-CM | POA: Diagnosis not present

## 2023-06-02 DIAGNOSIS — C4371 Malignant melanoma of right lower limb, including hip: Secondary | ICD-10-CM | POA: Diagnosis not present

## 2023-06-02 DIAGNOSIS — M533 Sacrococcygeal disorders, not elsewhere classified: Secondary | ICD-10-CM | POA: Diagnosis not present

## 2023-06-02 DIAGNOSIS — M5459 Other low back pain: Secondary | ICD-10-CM | POA: Diagnosis not present

## 2023-06-02 DIAGNOSIS — G8929 Other chronic pain: Secondary | ICD-10-CM

## 2023-06-02 DIAGNOSIS — M5416 Radiculopathy, lumbar region: Secondary | ICD-10-CM

## 2023-06-02 DIAGNOSIS — N1832 Chronic kidney disease, stage 3b: Secondary | ICD-10-CM | POA: Diagnosis not present

## 2023-06-02 DIAGNOSIS — Z7901 Long term (current) use of anticoagulants: Secondary | ICD-10-CM | POA: Diagnosis not present

## 2023-06-02 DIAGNOSIS — M25561 Pain in right knee: Secondary | ICD-10-CM | POA: Diagnosis not present

## 2023-06-02 DIAGNOSIS — I129 Hypertensive chronic kidney disease with stage 1 through stage 4 chronic kidney disease, or unspecified chronic kidney disease: Secondary | ICD-10-CM | POA: Diagnosis not present

## 2023-06-02 DIAGNOSIS — E785 Hyperlipidemia, unspecified: Secondary | ICD-10-CM | POA: Diagnosis not present

## 2023-06-02 DIAGNOSIS — Z01818 Encounter for other preprocedural examination: Secondary | ICD-10-CM | POA: Diagnosis not present

## 2023-06-02 DIAGNOSIS — D649 Anemia, unspecified: Secondary | ICD-10-CM | POA: Diagnosis not present

## 2023-06-02 NOTE — Therapy (Signed)
 OUTPATIENT PHYSICAL THERAPY TREATMENT   Patient Name: David [name] MRN: [id number] DOB:1945/04/29, 79 y.o., male Today's Date: 06/02/2023  END OF SESSION:  PT End of Session - 06/02/23 1150     Visit Number 9    Date for PT Re-Evaluation 06/21/23    Authorization Type [employer] Medicare    Authorization Time Period 04/26/23 - 06/21/23    Authorization - Visit Number 9    Authorization - Number of Visits 16    Progress Note Due on Visit 10    PT Start Time 1146    PT Stop Time 1225    PT Time Calculation (min) 39 min    Activity Tolerance Patient tolerated treatment well    Behavior During Therapy WFL for tasks assessed/performed              Past Medical History:  Diagnosis Date   Adenoma of right adrenal gland 07/11/2002   2.4cm , noted on CT ABD   Anxiety    Arthritis of both knees 03/26/2016   Astigmatism    Back pain    BCC (basal cell carcinoma of skin)    under right eye and right ear   Blood transfusion without reported diagnosis    Cataract 09/29/2016   Depression    Diverticulitis 2009   Diverticulosis    Family history of breast cancer    Family history of colon cancer    Fatty liver    GERD (gastroesophageal reflux disease)    Gout    Hearing loss of both ears 03/26/2016   History of atrial fibrillation    x 2 none since 2020   History of chronic prostatitis    started at age 57   History of kidney stones    Hx of adenomatous colonic polyps 02/16/2022   3 diminutive - no recall   Hyperglycemia    Hyperlipidemia    Hypertension    Incomplete right bundle branch block (RBBB)    Internal hemorrhoids    Kidney lesion 06/07/2015   Right midportion, 1.1x1.1 cm hyperechoic, noted on US  ABD   LAFB (left anterior fascicular block)    Lipoma of axilla 09/2016   Liver lesion, right lobe 06/07/2015   2.5x2.4x2.4 cm hypoechoic lesion posterior aspect, noted on US  ABD   Low back pain 03/26/2016   LVH (left ventricular hypertrophy) 08/06/2017   Moderate,  Noted on ECHO   Medicare annual wellness visit, subsequent 03/12/2014   OA (osteoarthritis)    Back, Hands, Neck   Obesity 11/25/2007   Qualifier: Diagnosis of  By: Elinor Guardian MD, Raenelle Bumpers    Other malaise and fatigue 03/13/2013   Premature ventricular complex    Prostate cancer (HCC) dx 2021   Renal insufficiency    Kidney removed right   Scoliosis    Upper thoracic and lumbar   Sinus bradycardia 08/06/2017   Noted on EKG   Vitamin D  deficiency    resolved   Past Surgical History:  Procedure Laterality Date   CARDIOVERSION  07/31/2017   CATARACT EXTRACTION, BILATERAL     COLON SURGERY  2009   segmental sigmoid resection   COLONOSCOPY     COLONOSCOPY WITH PROPOFOL      CYSTOSCOPY N/A 07/28/2019   Procedure: CYSTOSCOPY FLEXIBLE;  Surgeon: Marco Severs, MD;  Location: [facility];  Service: Urology;  Laterality: N/A;   CYSTOSCOPY W/ RETROGRADES Right 06/07/2018   Procedure: CYSTOSCOPY WITH RETROGRADE PYELOGRAM, uphrostogram;  Surgeon: Marco Severs, MD;  Location: [facility];  Service: Urology;  Laterality: Right;   CYSTOSCOPY WITH URETEROSCOPY AND STENT PLACEMENT Right 02/28/2018   Procedure: CYSTOSCOPY WITH URETEROSCOPY Sallyann Crea;  Surgeon: Ottelin, Mark, MD;  Location: [facility];  Service: Urology;  Laterality: Right;   CYSTOSCOPY/URETEROSCOPY/HOLMIUM LASER/STENT PLACEMENT Right 11/29/2017   Procedure: CYSTOSCOPY/RETROGRADE/URETEROSCOPY/HOLMIUM LASER/STENT PLACEMENT;  Surgeon: Ottelin, Mark, MD;  Location: [facility];  Service: Urology;  Laterality: Right;   EYE SURGERY Bilateral 01/12/2017   cataract removal   history of blood tranfusion  age 2   HOLMIUM LASER APPLICATION Right 09/24/2017   Procedure: HOLMIUM LASER APPLICATION;  Surgeon: Ottelin, Mark, MD;  [facility];  Service: Urology;  Laterality: Right;   IR BALLOON DILATION URETERAL STRICTURE RIGHT  03/14/2018   IR NEPHRO TUBE REMOV/FL  03/17/2018   IR  NEPHROSTOGRAM RIGHT THRU EXISTING ACCESS  03/17/2018   IR NEPHROSTOMY EXCHANGE RIGHT  03/14/2018   IR NEPHROSTOMY EXCHANGE RIGHT  06/21/2018   IR NEPHROSTOMY PLACEMENT RIGHT  03/02/2018   IR NEPHROSTOMY PLACEMENT RIGHT  04/20/2018   IR URETERAL STENT PLACEMENT EXISTING ACCESS RIGHT  03/14/2018   NOSE SURGERY     Submucous resection age 45   NUCLEAR STRESS TEST  06/03/2009   RADIOACTIVE SEED IMPLANT N/A 07/28/2019   Procedure: RADIOACTIVE SEED IMPLANT/BRACHYTHERAPY IMPLANT;  Surgeon: Marco Severs, MD;  Location: [facility];  Service: Urology;  Laterality: N/A;  90 MINS   ROBOT ASSISTED LAPAROSCOPIC NEPHRECTOMY Right 08/04/2018   Procedure: XI ROBOTIC ASSISTED LAPAROSCOPIC NEPHRECTOMY;  Surgeon: Marco Severs, MD;  Location: [facility];  Service: Urology;  Laterality: Right;  3 HRS   ROBOT ASSISTED PYELOPLASTY Right 06/07/2018   Procedure: attempted XI ROBOTIC ASSISTED PYELOPLASTY, lysis of adhesions;  Surgeon: Marco Severs, MD;  Location: [facility];  Service: Urology;  Laterality: Right;  3 HRS   SKIN BIOPSY     SPACE OAR INSTILLATION N/A 07/28/2019   Procedure: SPACE OAR INSTILLATION;  Surgeon: Marco Severs, MD;  Location[facility];  Service: Urology;  Laterality: N/A;   URETEROSCOPY WITH HOLMIUM LASER LITHOTRIPSY Right 09/24/2017   Procedure: CYSTOSCOPY, URETEROSCOPY WITH HOLMIUM LASER LITHOTRIPSY, STENT PLACEMENT;  Surgeon: Ottelin, Mark, MD;  Location: [facility];  Service: Urology;  Laterality: Right;   Patient Active Problem List   Diagnosis Date Noted   Gait abnormality 04/22/2023   Aortic root dilatation (HCC) 03/31/2023   Sacral pain 12/08/2022   Right shoulder pain 09/30/2022   Moderate dementia with mood disturbance (HCC) 07/12/2022   Chronic right ear pain 03/11/2022   Chronic anticoagulation 02/11/2022   CRI (chronic renal insufficiency) 10/29/2021   Abdominal pain 10/29/2021   Sun-damaged skin  07/23/2021   H/O prostate cancer 07/23/2021   Left knee pain 07/23/2021   Coronary atherosclerosis 02/11/2021   Myalgia 11/04/2020   Neck pain 11/04/2020   Stage 3a chronic kidney disease (HCC) 07/02/2020   Urinary urgency 02/12/2020   Colon cancer screening 09/26/2019   Family history of breast cancer    Family history of colon cancer    Educated about COVID-19 virus infection 08/26/2019   Prostate cancer (HCC) 04/28/2019   Pruritus 02/03/2019   LVH (left ventricular hypertrophy) 10/30/2018   Tinea versicolor 09/28/2018   Thoracic back pain 09/26/2018   Hypercalcemia 08/08/2018   Nonfunctioning kidney 08/04/2018   Arm pain 07/18/2018   Right hip pain 06/22/2018   Ureteral obstruction 06/07/2018   Recurrent nephrolithiasis 04/23/2018   Hydronephrosis 03/01/2018   Floppy eyelid syndrome  of both eyes 01/24/2018   Foot pain, bilateral 12/28/2017   Kidney stone 09/18/2017   Insomnia 09/17/2017   Abnormal TSH 04/15/2017   Senile nuclear sclerosis 01/18/2017   Cataract 09/29/2016   Muscle cramp 09/29/2016   Nocturia 09/29/2016   Hyperglycemia 03/29/2016   Low back pain with radiation 03/26/2016   Cataracts, bilateral 03/26/2016   Arthritis of both knees 03/26/2016   Hearing loss 03/26/2016   Complex renal cyst 06/16/2015   History of atrial fibrillation 06/16/2015   Snoring 06/07/2015   Somnolence 06/07/2015   Preventative health care 03/07/2015   Lipoma of axilla 06/04/2014   Tinea cruris 03/20/2014   Lichen simplex chronicus 03/20/2014   Medicare annual wellness visit, subsequent 03/12/2014   Depression with anxiety 09/25/2013   Constipation 09/25/2013   Gouty arthritis of toe of left foot 08/01/2013   Skin lesion 05/27/2013   Rash 05/26/2013   Abnormal liver function 03/18/2013   IBS (irritable bowel syndrome) 03/18/2013   Other malaise and fatigue 03/13/2013   Tinea corporis 03/13/2013   Gout 12/14/2012   Diarrhea 12/14/2012   Rectal irritation 09/18/2012    Diverticulosis    BCC (basal cell carcinoma of skin)    Scoliosis 01/19/2011   Hyperlipidemia, mixed 03/11/2010   Essential hypertension, benign 03/11/2010   Allergic rhinitis 03/11/2010   Arthralgia 03/11/2010   Obesity 11/25/2007   BENIGN NEOPLASM OF ADRENAL GLAND 09/22/2007   Fatty liver disease, nonalcoholic 09/22/2007   Osteoarthritis 09/22/2007   SPONDYLOSIS, LUMBAR 09/22/2007   GERD 04/12/2007    PCP: Neda Balk., MD  REFERRING PROVIDER: Raydell Cahill DO  REFERRING DIAG: L lumbar radiculopathy  THERAPY DIAG:  Other low back pain  Muscle weakness (generalized)  Left lumbar radiculopathy  SI (sacroiliac) pain  Chronic pain of right knee  RATIONALE FOR EVALUATION AND TREATMENT: Rehabilitation  ONSET DATE: chronic  NEXT MD VISIT: 06/24/23   SUBJECTIVE:                                                                                                                                                                                           SUBJECTIVE STATEMENT: No falls since last visit. Mainly tight over sore; however, feeling it in his left foot/ankle into his arch.   PERTINENT HISTORY:  HOH, Referred by sports medicine MD,   PAIN:  Are you having pain? Yes: NPRS scale: 1-6/10  Pain location: L foot and lower Pain description: sharp, intermittent stabbing  Aggravating factors: lifting his leg, extending great toe Relieving factors: nothing, rest    Are you having pain? Yes: NPRS scale: 5/10 Pain location: R LE Pain description: sharp  Aggravating factors: stiffens up with sitting  Relieving factors: lying flat in bed    PRECAUTIONS: Fall  RED FLAGS: None   WEIGHT BEARING RESTRICTIONS: No  FALLS:  Has patient fallen in last 6 months? Yes. Number of falls 7  LIVING ENVIRONMENT: Lives with: lives with their family and lives with their spouse Lives in: House/apartment Stairs: moving to an apartment with wife in 2 weeks that will be one  level Has following equipment at home: [employer] - 2 wheeled and [employer] - 4 wheeled  OCCUPATION: retired  PLOF: Independent  PATIENT GOALS: pt /wife want to improve pain   OBJECTIVE:  Note: Objective measures were completed at Evaluation unless otherwise noted.  DIAGNOSTIC FINDINGS:  MRI completed lumbar but results not in  PATIENT SURVEYS:  Modified Oswestry Modified Oswestry Low Back Pain Disability Questionnaire: 30 / 50 = 60.0 %   COGNITION: Overall cognitive status: Difficulty to assess due to: baseline not available but wife / MD notes report dementia, hallucinations     SENSATION: WFL  MUSCLE LENGTH: Hamstrings: Right -35 deg; Left -35 deg Thomas test: Right na deg; Left na deg  POSTURE: rounded shoulders, forward head, decreased lumbar lordosis, increased thoracic kyphosis, and posterior pelvic tilt  PALPATION: Non tender with palpation B hips, lumbar region  LUMBAR ROM: unable to assess Lumbar ROM due to balance and poor safety awareness   LOWER EXTREMITY ROM:   wfl B without deficits.   LOWER EXTREMITY MMT:    MMT Right eval Left eval  Hip flexion 4- 4-  Hip extension 4- 4-  Hip abduction 4- 4-  Hip adduction 4- 4-  Hip internal rotation    Hip external rotation    Knee flexion    Knee extension 4- 3+  Ankle dorsiflexion 4 3-  Ankle plantarflexion 3+ 3+  Ankle inversion    Ankle eversion     (Blank rows = not tested)  LUMBAR SPECIAL TESTS:  Straight leg raise test: Positive and Slump test: Positive, both on L  FUNCTIONAL TESTS:  30 seconds chair stand test 8 reps   GAIT: Distance walked: 68' x 2 Assistive device utilized: Single point cane Level of assistance: CGA Comments: very unsteady, scissoring and sway, L foot drop noted  TREATMENT DATE:  06/02/23 Nustep L5 x 9 min UEs/LEs Seated hamstring x30" R&L Seated gastroc stretch with strap x30" R&L Seated figure 4 stretch x 30" R&L Seated ankle DF with assist on L for full motion x10,  yellow TB on R x10 Amb with quad cane and black TB providing dorsiflexion assist, working on increasing step length and decreasing scissoring gait pattern 116/60 BP On L arm, seated after amb 1 lap -- pt had reported symptoms of lightheadedness and diaphoresis Seated heel raise 2x10, with ball squeeze between heels 2x10 Seated ankle inversion x10 Sit<>stand x10 125/80 BP on L arm, seated by end of session  05/31/23 Nustep L4 x 6 min  Ankle DF seated AROM x10 Ankle talar PAs MWM for DF x10 Amb x 2 laps with SPC and dorsiflexion assist with black TB working on improving ankle DF Supine clamshell green TB 3x10 Supine LTR 3x30" Supine bridge 2x10 Supine SLR 2x10   05/26/23 Nustep L4 x 6 min UEs/LEs Passive stretch for HS, piriformis and SKTC BLE Supine LTR 3x10" B Supine hip ADD stretch ( bent knee fallout) 3x10" Seated trunk rotation x 5 B Seated hamstring + ankle/DF x 10 each LE  05/24/23 Nustep L4 x 6 min UEs/LEs Seated  calf stretch with strap 2x30" Seated hamstring stretch x 30" Seated figure 4 stretch 2x 30" Supine hip flexor/quad stretch x 30" IASTM with massage roller bilat quads, hamstrings, calves  Supine clamshell 2x10 Supine LTR 2x10" Supine PPT x20   05/17/23 THERAPEUTIC EXERCISE: To improve strength, endurance, ROM, and flexibility.  Demonstration, verbal and tactile cues throughout for technique.  NuStep - L5 x 6 min - UEs/LEs Seated hip hinge HS stretch x 30" bil Seated figure-4 hip piriformis stretch x 30" bil Seated L DF AROM x 10 Standing heel-toe raises 2 x 10 - limited toe lift on L Standing L gastroc stretch 2 x 30"  NEUROMUSCULAR RE-EDUCATION: To improve balance, coordination, kinesthesia, posture, proprioception, and reduce fall risk.  Standing hip ABD with looped RTB at distal thighs x 10 - cues to maintain upright posture and avoid trunk lean to lift leg Standing hip extension with looped RTB at distal thighs x 10 - cues to maintain upright posture  and avoid trunk flexion to lift leg Standing hip flexion march with looped RTB at distal thighs x 10  B tandem stance with intermittent counter support 2 x 30" SLS with counter support 2 x 10-20" bil   05/11/23 THERAPEUTIC EXERCISE: To improve strength, endurance, ROM, and flexibility.  Demonstration, verbal and tactile cues throughout for technique.  NuStep - L5 x 6 min - UEs/Les Seated LAQ 2lb x 10 B Seated HS curls RTB x 10 B Seated toe raises x 10 B Standing heel-toe raises 2 x 10 - limited toe lift on L Standing L gastroc stretch x 30"  NEUROMUSCULAR RE-EDUCATION: To improve balance, coordination, kinesthesia, posture, proprioception, and reduce fall risk. Standing hip ABD with looped RTB at distal thighs 2x5 - cues to avoid hip hike on L side Standing hip extension with looped RTB at distal thighs x 10 - cues to maintain upright posture  Standing hip flexion march with looped RTB at distal thighs x 10  Seated ab sets 10x5"   05/07/23 THERAPEUTIC EXERCISE: To improve strength, endurance, ROM, and flexibility.  Demonstration, verbal and tactile cues throughout for technique.  NuStep - L5 x 6 min - UEs/LEs Seated hip hinge HS stretch x 30" bil Seated figure-4 hip piriformis stretch x 30" bil Standing heel-toe raises 2 x 10 - limited toe lift on L Standing L gastroc stretch x 30" Attempted YTB resisted L DF in sitting x 5 but minimal toe lift  NEUROMUSCULAR RE-EDUCATION: To improve balance, coordination, kinesthesia, posture, proprioception, and reduce fall risk. Standing hip ABD with looped RTB at distal thighs x 10 - cues to maintain upright posture and avoid trunk lean to lift leg Standing hip extension with looped RTB at distal thighs x 10 - cues to maintain upright posture and avoid trunk flexion to lift leg Standing hip flexion march with looped RTB at distal thighs x 10  Standing hip flexion SLR with looped RTB at distal thighs x 10  B side-stepping 2 x 10 feet along with  looped RTB at distal thighs B tandem stance with intermittent counter support 2 x 30" SLS with counter support 3 x 10" bil   PATIENT EDUCATION:  Education details: HEP review, BP check and what to do if it remains low Person educated: Patient Education method: Explanation, Demonstration, and Verbal cues Education comprehension: verbalized understanding, returned demonstration, verbal cues required, and needs further education  HOME EXERCISE PROGRAM: Access Code: [email or web address] URL: [email or web address] Date: 05/07/2023 Prepared by: Felecia Hopper  Exercises - Seated Hamstring  Stretch  - 1 x daily - 7 x weekly - 2 sets - 30 sec hold - Seated Figure 4 Piriformis Stretch  - 1 x daily - 7 x weekly - 2 sets - 30 sec hold - Heel Toe Raises with Counter Support  - 1 x daily - 7 x weekly - 2 sets - 10 reps - Standing Hip Extension with Counter Support  - 1 x daily - 7 x weekly - 2-3 sets - 10 reps - Standing Hip Flexion with Counter Support  - 1 x daily - 7 x weekly - 2-3 sets - 10 reps - Standing Hip Abduction with Resistance at Thighs  - 1 x daily - 7 x weekly - 2 sets - 10 reps - 3 sec hold - Standing Tandem Balance with Counter Support  - 1 x daily - 7 x weekly - 3 reps - 20-30 sec hold - Standing Single Leg Stance with Counter Support  - 1 x daily - 7 x weekly - 3-5 reps - 10 sec hold   ASSESSMENT:  CLINICAL IMPRESSION: Continued stretching to improve pt's tightness and gentle mobility as he tolerates. Worked primarily on ankle strengthening today. Attempted to work on improving ambulation; however, pt became lightheaded and diaphoretic. BP was checked and found to be lower than his normal. Provided water  and rechecked by end of session and it had increased but still slightly lower than his typical. Pt verbalizes understanding to monitor and check at home.   From eval: Patient is a 78 y.o. male  who was evaluated today by physical therapy due to L lumbar radiculopathy. He also  has h/o progressive dementia and frequent falls.  Noted marked weakness and atrophy B LE's , most L lower leg with pronounced foot drop.  Emphasized to him today the need to utilize the FWW that he has available at home, not his cane due to his unsteady gait today.  He should benefit from skilled PT to address his gross LE weakness as well as L LE weakness, which in turn should improve his activity tolerance and reduce his falls and lower back/ radicular pain.  Of note he has melanoma R plantar foot which his wife reports will be surgically removed in 2 weeks which may affect his ability to participate in PT.  Also they are moving in 3 weeks to a new handicap accessible apartment.   OBJECTIVE IMPAIRMENTS: decreased activity tolerance, decreased balance, decreased cognition, decreased coordination, decreased endurance, decreased knowledge of condition, decreased knowledge of use of DME, difficulty walking, decreased strength, decreased safety awareness, impaired perceived functional ability, improper body mechanics, postural dysfunction, and pain.   ACTIVITY LIMITATIONS: carrying, lifting, standing, transfers, reach over head, and locomotion level  PARTICIPATION LIMITATIONS: meal prep, interpersonal relationship, driving, shopping, and community activity  PERSONAL FACTORS: Age, Behavior pattern, Education, Fitness, Past/current experiences, Time since onset of injury/illness/exacerbation, and 1-2 comorbidities: progressive dementia, melanoma R plantar foot to be removed April 30,  are also affecting patient's functional outcome.   REHAB POTENTIAL: Fair high fall risk due to cognitive decline  CLINICAL DECISION MAKING: Evolving/moderate complexity  EVALUATION COMPLEXITY: Moderate   GOALS: Goals reviewed with patient? Yes  SHORT TERM GOALS: Target date: 2 weeks 05/10/23  I HEP Baseline: Goal status: IN PROGRESS - 05/17/23 - pt requesting further review   LONG TERM GOALS: Target date: 06/21/23    Modified Oswestry Low Back Pain Disability Questionnaire: 30 / 50 = 60.0 % improve to 40/50  Baseline:  Goal status:  INITIAL  2.  Patient will be able to walk safely greater than 350' with appropriate device.   Baseline: unsafe, high fall risk with st cane Goal status: INITIAL  3.  30 sec sit to stand increase from 8 reps to 12 reps for evidence of improved endurance, coordination, strength Baseline:  Goal status: INITIAL   PLAN:  PT FREQUENCY: 2x/week  PT DURATION: 8 weeks  PLANNED INTERVENTIONS: 97110-Therapeutic exercises, 97530- Therapeutic activity, 97112- Neuromuscular re-education, 97535- Self Care, and 40981- Manual therapy.  PLAN FOR NEXT SESSION: Any follow up for the foot up brace? therex in gym on cardiovascular equipment, and progressive strengthening LE's, balance challenges; continue working on endurance   Jhonnie Aliano April Larri Ply [place], PT 06/02/2023, 11:51 AM      Date of referral: 04/06/23 Referring provider: Raydell Cahill Referring diagnosis? L lumbar radiculopathy Treatment diagnosis? (if different than referring diagnosis) same  What was this (referring dx) caused by? Ongoing Issue  Lonne Roan of Condition: Recurrent (multiple episodes of < 3 months)   Laterality: Both  Current Functional Measure Score: Back Index 30/50  Objective measurements identify impairments when they are compared to normal values, the uninvolved extremity, and prior level of function.  [x]  Yes  []  No  Objective assessment of functional ability: Severe functional limitations   Briefly describe symptoms: chronic pain B lateral lower legs , weakness progressive, constant pain lower back, regardless of position  How did symptoms start: Multiple falls, at least 6 x in last 6 months, weakness and atrophy B LE's with foot drop L  Average pain intensity:  Last 24 hours: not stated  Past week: not stated just reported constant dull pain  How often does the pt experience symptoms?  Constantly  How much have the symptoms interfered with usual daily activities? Extremely  How has condition changed since care began at this facility? NA - initial visit  In general, how is the patients overall health? Fair

## 2023-06-05 ENCOUNTER — Other Ambulatory Visit: Payer: Self-pay | Admitting: Family Medicine

## 2023-06-08 NOTE — Progress Notes (Signed)
 David [name] please call and schedule a follow up with David [name] for June.  David Elms, LCSW  David [name], David [name], NT Unsuccessful call today to client. Can you please reschedule my phone visit with client or his spouse. Thank you David [name]  Thank you  David [name]  [facility]  [facility], [facility] Guide  Direct Dial: [phone]  Fax [phone]

## 2023-06-10 DIAGNOSIS — N1832 Chronic kidney disease, stage 3b: Secondary | ICD-10-CM | POA: Diagnosis not present

## 2023-06-10 DIAGNOSIS — L905 Scar conditions and fibrosis of skin: Secondary | ICD-10-CM | POA: Diagnosis not present

## 2023-06-10 DIAGNOSIS — K219 Gastro-esophageal reflux disease without esophagitis: Secondary | ICD-10-CM | POA: Diagnosis not present

## 2023-06-10 DIAGNOSIS — C4371 Malignant melanoma of right lower limb, including hip: Secondary | ICD-10-CM | POA: Diagnosis not present

## 2023-06-10 DIAGNOSIS — I129 Hypertensive chronic kidney disease with stage 1 through stage 4 chronic kidney disease, or unspecified chronic kidney disease: Secondary | ICD-10-CM | POA: Diagnosis not present

## 2023-06-10 DIAGNOSIS — K760 Fatty (change of) liver, not elsewhere classified: Secondary | ICD-10-CM | POA: Diagnosis not present

## 2023-06-10 DIAGNOSIS — Z87442 Personal history of urinary calculi: Secondary | ICD-10-CM | POA: Diagnosis not present

## 2023-06-14 ENCOUNTER — Ambulatory Visit: Payer: Medicare Other | Admitting: Adult Health

## 2023-06-15 ENCOUNTER — Telehealth: Payer: Self-pay

## 2023-06-15 NOTE — Progress Notes (Signed)
 Complex Care Management Note Care Guide Note  06/15/2023 Name: David [name] MRN: [id number] DOB: [birth year]   Complex Care Management Outreach Attempts: An unsuccessful telephone outreach was attempted today to offer the patient information about available complex care management services.  Follow Up Plan:  Additional outreach attempts will be made to offer the patient complex care management information and services.   Encounter Outcome:  No Answer  [facility]  [facility] Health Care Management Assistant Direct Dial: [phone]  Fax: [phone]

## 2023-06-17 ENCOUNTER — Telehealth: Payer: Self-pay

## 2023-06-17 NOTE — Telephone Encounter (Signed)
 Patient's son Ace Holder) was advised and he scheduled a visit for 06/29/23 @ 10:20 AM. He also is concerned about his father health.

## 2023-06-17 NOTE — Telephone Encounter (Signed)
 Copied from CRM [id number]. Topic: General - Other >> Jun 15, 2023  4:26 PM Howard Macho wrote: Reason for CRM: The patient son called stating the patient was diagnosed with skin cancer on his foot and his wife is in the hospital. The patient son stated he does not know the doctor name who diagnosed the patient with the skin cancer [phone]

## 2023-06-18 NOTE — Progress Notes (Signed)
 Complex Care Management Note Care Guide Note  06/18/2023 Name: David [name] MRN: [id number] DOB: [birth year]   Complex Care Management Outreach Attempts: A second unsuccessful outreach was attempted today to offer the patient with information about available complex care management services.  Follow Up Plan:  No further outreach attempts will be made at this time. We have been unable to contact the patient to offer or enroll patient in complex care management services.  Encounter Outcome:  No Answer  [facility]  [facility], [facility] Health Care Management Assistant Direct Dial: [phone]  Fax: [phone]

## 2023-06-22 ENCOUNTER — Encounter: Payer: Self-pay | Admitting: Sports Medicine

## 2023-06-22 ENCOUNTER — Ambulatory Visit: Admitting: Sports Medicine

## 2023-06-22 ENCOUNTER — Telehealth: Payer: Self-pay | Admitting: Neurology

## 2023-06-22 VITALS — BP 138/84 | Ht 71.0 in | Wt 222.0 lb

## 2023-06-22 DIAGNOSIS — M21372 Foot drop, left foot: Secondary | ICD-10-CM

## 2023-06-22 NOTE — Telephone Encounter (Signed)
 Call to wife, no answer. Left message to clarify if needing sooner appt with Amy because that's who patient is schedule with .

## 2023-06-22 NOTE — Progress Notes (Signed)
   Subjective:    Patient ID: David [name], male    DOB: July 23, 1945, 78 y.o.   MRN: [id number]  HPI  Patient presents today with left foot drop.  He has been seen previously for left leg radiculopathy.  MRI of his lumbar spine done in April showed some foraminal stenosis at multiple levels, worse on the right.  He is here today with his son.  His son feels like his weakness has been ongoing for several months.  He has tried physical therapy.  He has also endorsed pain in the left leg and low back.  He has recently had surgery on the plantar aspect of his right foot to remove skin cancer.  He is currently in a walking boot for that and is scheduled to undergo surgical grafting in a few days.   Review of Systems As above    Objective:   Physical Exam  Well-developed, sitting comfortably in the wheelchair.  Left foot: Patient is unable to actively extend the left great toe.  He has significant weakness with active dorsiflexion of the ankle resulting in a foot drop.  Reflexes are trace but equal at the patellar tendons.      Assessment & Plan:   Left foot drop  I recommend urgent referrals to both Dr. Colette Davies (orthopedic spine surgery) and Dr. Gracie Lav (neurology).  His prior radiculopathy is suspicious for lumbar spine, but his MRI does not show pathology significant enough to completely explain this.  Therefore, I think consultation with Dr. Gracie Lav is also appropriate for evaluation and EMG/nerve conduction study.  This is complicated by the fact that he is in a walking boot on his right foot for his recent skin surgery.

## 2023-06-22 NOTE — Telephone Encounter (Signed)
 Pt wife call requesting earlier appt . Pt wife states that pt  have to have surgery on foot and prefer to see DrGracie Lav before Pt surgery because pt will not be able to walk .

## 2023-06-24 ENCOUNTER — Telehealth: Payer: Self-pay | Admitting: Neurology

## 2023-06-24 ENCOUNTER — Encounter: Payer: Medicare Other | Admitting: Family Medicine

## 2023-06-24 DIAGNOSIS — F03B3 Unspecified dementia, moderate, with mood disturbance: Secondary | ICD-10-CM

## 2023-06-24 DIAGNOSIS — C4371 Malignant melanoma of right lower limb, including hip: Secondary | ICD-10-CM | POA: Diagnosis not present

## 2023-06-24 DIAGNOSIS — Z483 Aftercare following surgery for neoplasm: Secondary | ICD-10-CM | POA: Diagnosis not present

## 2023-06-24 DIAGNOSIS — S91301A Unspecified open wound, right foot, initial encounter: Secondary | ICD-10-CM | POA: Diagnosis not present

## 2023-06-24 DIAGNOSIS — R2689 Other abnormalities of gait and mobility: Secondary | ICD-10-CM | POA: Diagnosis not present

## 2023-06-24 DIAGNOSIS — R4189 Other symptoms and signs involving cognitive functions and awareness: Secondary | ICD-10-CM

## 2023-06-24 DIAGNOSIS — R269 Unspecified abnormalities of gait and mobility: Secondary | ICD-10-CM

## 2023-06-24 NOTE — Telephone Encounter (Signed)
Orders Placed This Encounter  Procedures   NCV with EMG(electromyography)      

## 2023-06-25 DIAGNOSIS — S91301A Unspecified open wound, right foot, initial encounter: Secondary | ICD-10-CM | POA: Diagnosis not present

## 2023-06-25 DIAGNOSIS — R2689 Other abnormalities of gait and mobility: Secondary | ICD-10-CM | POA: Diagnosis not present

## 2023-06-26 DIAGNOSIS — Z85820 Personal history of malignant melanoma of skin: Secondary | ICD-10-CM | POA: Diagnosis not present

## 2023-06-26 DIAGNOSIS — I10 Essential (primary) hypertension: Secondary | ICD-10-CM | POA: Diagnosis not present

## 2023-06-26 DIAGNOSIS — M109 Gout, unspecified: Secondary | ICD-10-CM | POA: Diagnosis not present

## 2023-06-26 DIAGNOSIS — M1990 Unspecified osteoarthritis, unspecified site: Secondary | ICD-10-CM | POA: Diagnosis not present

## 2023-06-26 DIAGNOSIS — H43393 Other vitreous opacities, bilateral: Secondary | ICD-10-CM | POA: Diagnosis not present

## 2023-06-26 DIAGNOSIS — R2689 Other abnormalities of gait and mobility: Secondary | ICD-10-CM | POA: Diagnosis not present

## 2023-06-26 DIAGNOSIS — E782 Mixed hyperlipidemia: Secondary | ICD-10-CM | POA: Diagnosis not present

## 2023-06-26 DIAGNOSIS — R279 Unspecified lack of coordination: Secondary | ICD-10-CM | POA: Diagnosis not present

## 2023-06-26 DIAGNOSIS — I4891 Unspecified atrial fibrillation: Secondary | ICD-10-CM | POA: Diagnosis not present

## 2023-06-26 DIAGNOSIS — Z48817 Encounter for surgical aftercare following surgery on the skin and subcutaneous tissue: Secondary | ICD-10-CM | POA: Diagnosis not present

## 2023-06-26 DIAGNOSIS — R1312 Dysphagia, oropharyngeal phase: Secondary | ICD-10-CM | POA: Diagnosis not present

## 2023-06-26 DIAGNOSIS — E785 Hyperlipidemia, unspecified: Secondary | ICD-10-CM | POA: Diagnosis not present

## 2023-06-26 DIAGNOSIS — S91301A Unspecified open wound, right foot, initial encounter: Secondary | ICD-10-CM | POA: Diagnosis not present

## 2023-06-26 DIAGNOSIS — C4371 Malignant melanoma of right lower limb, including hip: Secondary | ICD-10-CM | POA: Diagnosis not present

## 2023-06-26 DIAGNOSIS — S91301D Unspecified open wound, right foot, subsequent encounter: Secondary | ICD-10-CM | POA: Diagnosis not present

## 2023-06-26 DIAGNOSIS — R2681 Unsteadiness on feet: Secondary | ICD-10-CM | POA: Diagnosis not present

## 2023-06-26 DIAGNOSIS — M6281 Muscle weakness (generalized): Secondary | ICD-10-CM | POA: Diagnosis not present

## 2023-06-28 DIAGNOSIS — M109 Gout, unspecified: Secondary | ICD-10-CM | POA: Diagnosis not present

## 2023-06-28 DIAGNOSIS — S91301D Unspecified open wound, right foot, subsequent encounter: Secondary | ICD-10-CM | POA: Diagnosis not present

## 2023-06-28 DIAGNOSIS — M6281 Muscle weakness (generalized): Secondary | ICD-10-CM | POA: Diagnosis not present

## 2023-06-28 DIAGNOSIS — Z48817 Encounter for surgical aftercare following surgery on the skin and subcutaneous tissue: Secondary | ICD-10-CM | POA: Diagnosis not present

## 2023-06-28 DIAGNOSIS — R2689 Other abnormalities of gait and mobility: Secondary | ICD-10-CM | POA: Diagnosis not present

## 2023-06-28 DIAGNOSIS — I4891 Unspecified atrial fibrillation: Secondary | ICD-10-CM | POA: Diagnosis not present

## 2023-06-28 DIAGNOSIS — I10 Essential (primary) hypertension: Secondary | ICD-10-CM | POA: Diagnosis not present

## 2023-06-28 DIAGNOSIS — E782 Mixed hyperlipidemia: Secondary | ICD-10-CM | POA: Diagnosis not present

## 2023-06-28 NOTE — Assessment & Plan Note (Deleted)
 Hydrate and monitor

## 2023-06-28 NOTE — Assessment & Plan Note (Deleted)
 Encourage heart healthy diet such as MIND or DASH diet, increase exercise, avoid trans fats, simple carbohydrates and processed foods, consider a krill or fish or flaxseed oil cap daily.

## 2023-06-28 NOTE — Assessment & Plan Note (Deleted)
 Well controlled, no changes to meds. Encouraged heart healthy diet such as the DASH diet and exercise as tolerated.

## 2023-06-28 NOTE — Assessment & Plan Note (Deleted)
 hgba1c acceptable, minimize simple carbs. Increase exercise as tolerated.

## 2023-06-29 ENCOUNTER — Telehealth: Admitting: Family Medicine

## 2023-06-29 ENCOUNTER — Encounter: Payer: Self-pay | Admitting: Family Medicine

## 2023-06-29 DIAGNOSIS — M6281 Muscle weakness (generalized): Secondary | ICD-10-CM | POA: Diagnosis not present

## 2023-06-29 DIAGNOSIS — I10 Essential (primary) hypertension: Secondary | ICD-10-CM

## 2023-06-29 DIAGNOSIS — E782 Mixed hyperlipidemia: Secondary | ICD-10-CM | POA: Diagnosis not present

## 2023-06-29 DIAGNOSIS — M109 Gout, unspecified: Secondary | ICD-10-CM | POA: Diagnosis not present

## 2023-06-29 DIAGNOSIS — R252 Cramp and spasm: Secondary | ICD-10-CM

## 2023-06-29 DIAGNOSIS — R739 Hyperglycemia, unspecified: Secondary | ICD-10-CM

## 2023-06-29 DIAGNOSIS — M1A9XX0 Chronic gout, unspecified, without tophus (tophi): Secondary | ICD-10-CM

## 2023-06-29 DIAGNOSIS — I4891 Unspecified atrial fibrillation: Secondary | ICD-10-CM | POA: Diagnosis not present

## 2023-06-29 DIAGNOSIS — N189 Chronic kidney disease, unspecified: Secondary | ICD-10-CM

## 2023-07-01 DIAGNOSIS — S91301D Unspecified open wound, right foot, subsequent encounter: Secondary | ICD-10-CM | POA: Diagnosis not present

## 2023-07-01 DIAGNOSIS — M109 Gout, unspecified: Secondary | ICD-10-CM | POA: Diagnosis not present

## 2023-07-01 DIAGNOSIS — I4891 Unspecified atrial fibrillation: Secondary | ICD-10-CM | POA: Diagnosis not present

## 2023-07-01 DIAGNOSIS — M6281 Muscle weakness (generalized): Secondary | ICD-10-CM | POA: Diagnosis not present

## 2023-07-01 DIAGNOSIS — Z48817 Encounter for surgical aftercare following surgery on the skin and subcutaneous tissue: Secondary | ICD-10-CM | POA: Diagnosis not present

## 2023-07-01 DIAGNOSIS — R2689 Other abnormalities of gait and mobility: Secondary | ICD-10-CM | POA: Diagnosis not present

## 2023-07-01 DIAGNOSIS — I10 Essential (primary) hypertension: Secondary | ICD-10-CM | POA: Diagnosis not present

## 2023-07-01 DIAGNOSIS — E782 Mixed hyperlipidemia: Secondary | ICD-10-CM | POA: Diagnosis not present

## 2023-07-02 DIAGNOSIS — I10 Essential (primary) hypertension: Secondary | ICD-10-CM | POA: Diagnosis not present

## 2023-07-02 DIAGNOSIS — M6281 Muscle weakness (generalized): Secondary | ICD-10-CM | POA: Diagnosis not present

## 2023-07-02 DIAGNOSIS — I4891 Unspecified atrial fibrillation: Secondary | ICD-10-CM | POA: Diagnosis not present

## 2023-07-02 DIAGNOSIS — M109 Gout, unspecified: Secondary | ICD-10-CM | POA: Diagnosis not present

## 2023-07-02 DIAGNOSIS — Z85820 Personal history of malignant melanoma of skin: Secondary | ICD-10-CM | POA: Diagnosis not present

## 2023-07-05 DIAGNOSIS — S91301D Unspecified open wound, right foot, subsequent encounter: Secondary | ICD-10-CM | POA: Diagnosis not present

## 2023-07-05 DIAGNOSIS — M109 Gout, unspecified: Secondary | ICD-10-CM | POA: Diagnosis not present

## 2023-07-05 DIAGNOSIS — R2689 Other abnormalities of gait and mobility: Secondary | ICD-10-CM | POA: Diagnosis not present

## 2023-07-05 DIAGNOSIS — Z48817 Encounter for surgical aftercare following surgery on the skin and subcutaneous tissue: Secondary | ICD-10-CM | POA: Diagnosis not present

## 2023-07-05 DIAGNOSIS — M6281 Muscle weakness (generalized): Secondary | ICD-10-CM | POA: Diagnosis not present

## 2023-07-05 DIAGNOSIS — I10 Essential (primary) hypertension: Secondary | ICD-10-CM | POA: Diagnosis not present

## 2023-07-05 DIAGNOSIS — I4891 Unspecified atrial fibrillation: Secondary | ICD-10-CM | POA: Diagnosis not present

## 2023-07-05 DIAGNOSIS — E782 Mixed hyperlipidemia: Secondary | ICD-10-CM | POA: Diagnosis not present

## 2023-07-07 DIAGNOSIS — I10 Essential (primary) hypertension: Secondary | ICD-10-CM | POA: Diagnosis not present

## 2023-07-07 DIAGNOSIS — E782 Mixed hyperlipidemia: Secondary | ICD-10-CM | POA: Diagnosis not present

## 2023-07-07 DIAGNOSIS — C4371 Malignant melanoma of right lower limb, including hip: Secondary | ICD-10-CM | POA: Diagnosis not present

## 2023-07-07 DIAGNOSIS — M109 Gout, unspecified: Secondary | ICD-10-CM | POA: Diagnosis not present

## 2023-07-07 DIAGNOSIS — I4891 Unspecified atrial fibrillation: Secondary | ICD-10-CM | POA: Diagnosis not present

## 2023-07-07 DIAGNOSIS — M6281 Muscle weakness (generalized): Secondary | ICD-10-CM | POA: Diagnosis not present

## 2023-07-08 DIAGNOSIS — M6281 Muscle weakness (generalized): Secondary | ICD-10-CM | POA: Diagnosis not present

## 2023-07-08 DIAGNOSIS — Z48817 Encounter for surgical aftercare following surgery on the skin and subcutaneous tissue: Secondary | ICD-10-CM | POA: Diagnosis not present

## 2023-07-08 DIAGNOSIS — S91301D Unspecified open wound, right foot, subsequent encounter: Secondary | ICD-10-CM | POA: Diagnosis not present

## 2023-07-08 DIAGNOSIS — R2689 Other abnormalities of gait and mobility: Secondary | ICD-10-CM | POA: Diagnosis not present

## 2023-07-09 DIAGNOSIS — M109 Gout, unspecified: Secondary | ICD-10-CM | POA: Diagnosis not present

## 2023-07-09 DIAGNOSIS — M6281 Muscle weakness (generalized): Secondary | ICD-10-CM | POA: Diagnosis not present

## 2023-07-09 DIAGNOSIS — I10 Essential (primary) hypertension: Secondary | ICD-10-CM | POA: Diagnosis not present

## 2023-07-09 DIAGNOSIS — E782 Mixed hyperlipidemia: Secondary | ICD-10-CM | POA: Diagnosis not present

## 2023-07-09 DIAGNOSIS — I4891 Unspecified atrial fibrillation: Secondary | ICD-10-CM | POA: Diagnosis not present

## 2023-07-12 ENCOUNTER — Telehealth: Payer: Self-pay | Admitting: Urology

## 2023-07-12 DIAGNOSIS — I10 Essential (primary) hypertension: Secondary | ICD-10-CM | POA: Diagnosis not present

## 2023-07-12 DIAGNOSIS — S91301D Unspecified open wound, right foot, subsequent encounter: Secondary | ICD-10-CM | POA: Diagnosis not present

## 2023-07-12 DIAGNOSIS — R2689 Other abnormalities of gait and mobility: Secondary | ICD-10-CM | POA: Diagnosis not present

## 2023-07-12 DIAGNOSIS — E782 Mixed hyperlipidemia: Secondary | ICD-10-CM | POA: Diagnosis not present

## 2023-07-12 DIAGNOSIS — I4891 Unspecified atrial fibrillation: Secondary | ICD-10-CM | POA: Diagnosis not present

## 2023-07-12 DIAGNOSIS — M6281 Muscle weakness (generalized): Secondary | ICD-10-CM | POA: Diagnosis not present

## 2023-07-12 DIAGNOSIS — M109 Gout, unspecified: Secondary | ICD-10-CM | POA: Diagnosis not present

## 2023-07-12 DIAGNOSIS — Z48817 Encounter for surgical aftercare following surgery on the skin and subcutaneous tissue: Secondary | ICD-10-CM | POA: Diagnosis not present

## 2023-07-12 NOTE — Telephone Encounter (Signed)
 Pt wife called pt thinks he has a bladder infection he is going to the bathroom every 15 minutes and bladder spasms. Pt is not urinating that much when he does go to urinate. Can pt come in and give a sample. Please advise.

## 2023-07-13 ENCOUNTER — Telehealth: Payer: Self-pay

## 2023-07-13 NOTE — Transitions of Care (Post Inpatient/ED Visit) (Signed)
   07/13/2023  Name: David [name] MRN: [id number] DOB: 04/19/1945  Today's TOC FU Call Status: Today's TOC FU Call Status:: Successful TOC FU Call Completed TOC FU Call Complete Date: 07/13/23 Patient's Name and Date of Birth confirmed.  Transition Care Management Follow-up Telephone Call Date of Discharge: 07/12/23 Discharge Facility: Other (Non-[facility] Facility) Name of Other (Non[facility]) Discharge Facility: [facility] Type of Discharge: Inpatient Admission Primary Inpatient Discharge Diagnosis:: melnoma right foot - skin grafts - rehab to strengthen leg How have you been since you were released from the hospital?: Worse Any questions or concerns?: Yes Patient Questions/Concerns:: cognitive impairment now and thinks he may have UTI Patient Questions/Concerns Addressed: Notified Provider of Patient Questions/Concerns  Items Reviewed: Did you receive and understand the discharge instructions provided?: Yes Medications obtained,verified, and reconciled?: Yes (Medications Reviewed) Any new allergies since your discharge?: No Dietary orders reviewed?: NA Do you have support at home?: Yes People in Home [RPT]: spouse Name of Support/Comfort Primary Source: Ginger  Medications Reviewed Today: Medications Reviewed Today   Medications were not reviewed in this encounter     Home Care and Equipment/Supplies: Were Home Health Services Ordered?: Yes Name of Home Health Agency:: forgot name Has Agency set up a time to come to your home?: No EMR reviewed for Home Health Orders: Orders present/patient has not received call (refer to CM for follow-up) Any new equipment or medical supplies ordered?: No  Functional Questionnaire: Do you need assistance with bathing/showering or dressing?: No (cannot shower yet) Do you need assistance with meal preparation?: No Do you need assistance with eating?: No Do you have difficulty maintaining continence: No Do you need assistance  with getting out of bed/getting out of a chair/moving?: No Do you have difficulty managing or taking your medications?: Yes (wife admins meds)  Follow up appointments reviewed: PCP Follow-up appointment confirmed?: Yes Date of PCP follow-up appointment?: 07/14/23 Follow-up Provider: Waddell Mon Specialist [facility] Follow-up appointment confirmed?: Yes Date of Specialist follow-up appointment?: 07/14/23 Follow-Up Specialty Provider:: [facility] 7/10 and Ortho 7/16 Do you need transportation to your follow-up appointment?: No Do you understand care options if your condition(s) worsen?: Yes-patient verbalized understanding    SIGNATURE Mahika Vanvoorhis, LPN [facility] AWV Team Direct Dial: [phone]

## 2023-07-14 ENCOUNTER — Other Ambulatory Visit: Payer: Self-pay

## 2023-07-14 ENCOUNTER — Ambulatory Visit: Admitting: Family Medicine

## 2023-07-14 ENCOUNTER — Other Ambulatory Visit

## 2023-07-14 ENCOUNTER — Encounter: Payer: Self-pay | Admitting: Family Medicine

## 2023-07-14 VITALS — BP 129/74 | HR 55 | Ht 71.0 in

## 2023-07-14 DIAGNOSIS — R399 Unspecified symptoms and signs involving the genitourinary system: Secondary | ICD-10-CM

## 2023-07-14 DIAGNOSIS — Z09 Encounter for follow-up examination after completed treatment for conditions other than malignant neoplasm: Secondary | ICD-10-CM

## 2023-07-14 DIAGNOSIS — C439 Malignant melanoma of skin, unspecified: Secondary | ICD-10-CM | POA: Diagnosis not present

## 2023-07-14 DIAGNOSIS — S91301A Unspecified open wound, right foot, initial encounter: Secondary | ICD-10-CM | POA: Diagnosis not present

## 2023-07-14 DIAGNOSIS — Z4689 Encounter for fitting and adjustment of other specified devices: Secondary | ICD-10-CM | POA: Diagnosis not present

## 2023-07-14 LAB — URINALYSIS, ROUTINE W REFLEX MICROSCOPIC
Bilirubin, UA: NEGATIVE
Glucose, UA: NEGATIVE
Ketones, UA: NEGATIVE
Leukocytes,UA: NEGATIVE
Nitrite, UA: NEGATIVE
Protein,UA: NEGATIVE
RBC, UA: NEGATIVE
Specific Gravity, UA: 1.02 (ref 1.005–1.030)
Urobilinogen, Ur: 1 mg/dL (ref 0.2–1.0)
pH, UA: 6 (ref 5.0–7.5)

## 2023-07-14 MED ORDER — AMLODIPINE BESYLATE 2.5 MG PO TABS: 2.5000 mg | ORAL_TABLET | Freq: Every day | ORAL | 1 refills | Status: DC

## 2023-07-14 MED ORDER — QUETIAPINE FUMARATE 25 MG PO TABS: 25.0000 mg | ORAL_TABLET | Freq: Every day | ORAL | 1 refills | Status: DC

## 2023-07-14 NOTE — Progress Notes (Signed)
 Acute Office Visit  Subjective:     Patient ID: David [name], male    DOB: 1945/10/11, 78 y.o.   MRN: [id number]  Chief Complaint  Patient presents with   Medical Management of Chronic Issues    HPI Patient is in today for hospital follow-up.  Discussed the use of AI scribe software for clinical note transcription with the patient, who gave verbal consent to proceed.  History of Present Illness David [name] is a 78 year old male with malignant melanoma who presents for hospital follow-up with issues related to post-surgical care and cast discomfort.  He underwent surgery for malignant melanoma on the right foot, specifically on the ball of the foot, with the initial procedure in May to remove the melanoma. A subsequent surgery on June 12th involved a skin graft performed by a Engineer, petroleum [facility]). Post-surgery, he has been in a series of casts, currently on his fourth, which is causing significant discomfort and agitation. The cast extends to just below the knee, causing discomfort and altering his gait. He finds the cast 'very uncomfortable' and has been trying to remove it, leading to damage to the cast.  He was hospitalized for two days post-surgery and then transferred to a rehabilitation facility until June 30th. Since returning home, he has been experiencing significant discomfort with the cast, leading to attempts to remove it (some mild cognitive impairment at baseline, more agitated with the cast).  He experienced urinary symptoms upon returning home, including difficulty urinating and pain, which he suspected to be a urinary tract infection. The symptoms included frequent urination and greenish urine, but these symptoms have since resolved. Urology collected a UA today which was negative- he has a follow-up with them next week.   He fell in the nursing home bathroom, resulting in a large bruise on his hip, but reports no significant pain from the bruise. X-rays  confirmed no fractures. No fever and no current urinary symptoms.     ROS All review of systems negative except what is listed in the HPI      Objective:    BP 129/74   Pulse (!) 55   Ht 5' 11 (1.803 m)   SpO2 97%   BMI 30.96 kg/m    Physical Exam Vitals reviewed.  Constitutional:      General: He is not in acute distress.    Appearance: Normal appearance. He is not ill-appearing.  Pulmonary:     Effort: No respiratory distress.  Musculoskeletal:        General: Normal range of motion.     Right lower leg: No edema.     Left lower leg: No edema.     Comments: Cast to RLE which he has cut - see picture  Skin:    General: Skin is warm and dry.     Findings: No rash.  Neurological:     Mental Status: He is alert and oriented to person, place, and time.  Psychiatric:        Mood and Affect: Mood normal.        Behavior: Behavior normal.             Assessment & Plan:   Problem List Items Addressed This Visit   None Visit Diagnoses       Hospital discharge follow-up    -  Primary     Melanoma of skin (HCC)       Relevant Medications   aspirin EC 81 MG tablet  Stable, no new concerns. Encouraged adequate hydration and protein intake.  Safety discussed. Covered sharp edge of cast with paper tape to prevent abrasions. No rashes or lesions noted today.  Wife is waiting on a call back from plastic surgeons office regarding the cast. Declined updating labs today. Due for routine PCP follow-up in 3 weeks. No new symptoms.   Meds ordered this encounter  Medications   QUEtiapine  (SEROQUEL ) 25 MG tablet    Sig: Take 1 tablet (25 mg total) by mouth at bedtime.    Dispense:  90 tablet    Refill:  1   amLODipine  (NORVASC ) 2.5 MG tablet    Sig: Take 1 tablet (2.5 mg total) by mouth daily.    Dispense:  90 tablet    Refill:  1    Please mail to home address of file. Thanks    Return for - keep upcoming PCP appt.  Waddell KATHEE Mon, NP

## 2023-07-18 DIAGNOSIS — C4371 Malignant melanoma of right lower limb, including hip: Secondary | ICD-10-CM | POA: Diagnosis not present

## 2023-07-18 DIAGNOSIS — E785 Hyperlipidemia, unspecified: Secondary | ICD-10-CM | POA: Diagnosis not present

## 2023-07-18 DIAGNOSIS — Z604 Social exclusion and rejection: Secondary | ICD-10-CM | POA: Diagnosis not present

## 2023-07-18 DIAGNOSIS — I129 Hypertensive chronic kidney disease with stage 1 through stage 4 chronic kidney disease, or unspecified chronic kidney disease: Secondary | ICD-10-CM | POA: Diagnosis not present

## 2023-07-18 DIAGNOSIS — Z79899 Other long term (current) drug therapy: Secondary | ICD-10-CM | POA: Diagnosis not present

## 2023-07-18 DIAGNOSIS — K5900 Constipation, unspecified: Secondary | ICD-10-CM | POA: Diagnosis not present

## 2023-07-18 DIAGNOSIS — Z945 Skin transplant status: Secondary | ICD-10-CM | POA: Diagnosis not present

## 2023-07-18 DIAGNOSIS — Z7901 Long term (current) use of anticoagulants: Secondary | ICD-10-CM | POA: Diagnosis not present

## 2023-07-18 DIAGNOSIS — N183 Chronic kidney disease, stage 3 unspecified: Secondary | ICD-10-CM | POA: Diagnosis not present

## 2023-07-18 DIAGNOSIS — Z556 Problems related to health literacy: Secondary | ICD-10-CM | POA: Diagnosis not present

## 2023-07-18 DIAGNOSIS — R1310 Dysphagia, unspecified: Secondary | ICD-10-CM | POA: Diagnosis not present

## 2023-07-18 DIAGNOSIS — Z48298 Encounter for aftercare following other organ transplant: Secondary | ICD-10-CM | POA: Diagnosis not present

## 2023-07-18 DIAGNOSIS — Z483 Aftercare following surgery for neoplasm: Secondary | ICD-10-CM | POA: Diagnosis not present

## 2023-07-18 DIAGNOSIS — M1990 Unspecified osteoarthritis, unspecified site: Secondary | ICD-10-CM | POA: Diagnosis not present

## 2023-07-18 DIAGNOSIS — I4891 Unspecified atrial fibrillation: Secondary | ICD-10-CM | POA: Diagnosis not present

## 2023-07-18 DIAGNOSIS — Z9181 History of falling: Secondary | ICD-10-CM | POA: Diagnosis not present

## 2023-07-19 ENCOUNTER — Telehealth: Payer: Self-pay

## 2023-07-19 ENCOUNTER — Ambulatory Visit: Payer: Self-pay | Admitting: Urology

## 2023-07-19 NOTE — Telephone Encounter (Signed)
 Returned Hadassah w/ [facility] call and no answer left vm to return call.   Copied from CRM [id number]. Topic: Clinical - Home Health Verbal Orders >> Jul 19, 2023  3:04 PM Robinson DEL wrote: Caller/Agency: [facility] Callback Number: [phone] Secure line Service Requested: Physical Therapy Frequency: 2 week 8 Any new concerns about the patient? No

## 2023-07-20 ENCOUNTER — Ambulatory Visit: Payer: Self-pay

## 2023-07-20 NOTE — Telephone Encounter (Signed)
 Communication  Caller/Agency: [facility]    Callback Number: [phone] Secure line    Service Requested: Physical Therapy    Frequency: 2 week 8    Any new concerns about the patient? No   Okay for orders?

## 2023-07-20 NOTE — Telephone Encounter (Signed)
 Pt's wife called back to follow up regarding the request to increase medication for anxiety.  Nurse informed wife it would be tomorrow for she gets a call back due to the office being closed.  Explained to wife that I am not authorized to make those decisions.  Attempted to officer virtual urgent care but no opening.  Will route information to PCP to evaluate and discuss with patient.

## 2023-07-20 NOTE — Telephone Encounter (Signed)
 FYI Only or Action Required?: FYI only for provider.  Patient was last seen in primary care on 07/14/2023 by Almarie Waddell NOVAK, NP.  Called Nurse Triage reporting No chief complaint on file..  Symptoms began today.  Interventions attempted: Nothing.  Symptoms are: unchanged.  Triage Disposition: No disposition on file.  Patient/caregiver understands and will follow disposition?:   Copied from CRM [id number]. Topic: Clinical - Red Word Triage >> Jul 20, 2023  2:42 PM Shereese L wrote: [facility] that prompted transfer to Nurse Triage: patient is on LORazepam  (ATIVAN ) 1 MG tablet and still very agitated and wife stated that she can't get him to calm down because he's having episodes  Reason for Disposition  [1] Caller has URGENT medicine question about med that PCP or specialist prescribed AND [2] triager unable to answer question  Answer Assessment - Initial Assessment Questions 1. NAME of MEDICINE: What medicine(s) are you calling about?     Lorazepam   2. QUESTION: What is your question? (e.g., double dose of medicine, side effect)     Increase in Dosage, or Alternative  3. PRESCRIBER: Who prescribed the medicine? Reason: if prescribed by specialist, call should be referred to that group.     Dr. Domenica  4. SYMPTOMS: Do you have any symptoms? If Yes, ask: What symptoms are you having?  How bad are the symptoms (e.g., mild, moderate, severe)     Agitation, Restlessness   Patient's wife is requesting a callback for either an increase in dosage or an alternative medication. Please contact Ginger, Spouse of patient as soon as possible.  Protocols used: Medication Question Call-A-AH

## 2023-07-21 ENCOUNTER — Ambulatory Visit: Payer: Medicare Other | Admitting: Urology

## 2023-07-22 ENCOUNTER — Encounter: Payer: Self-pay | Admitting: Urology

## 2023-07-22 ENCOUNTER — Ambulatory Visit: Admitting: Urology

## 2023-07-22 VITALS — BP 121/63 | HR 58 | Ht 71.0 in | Wt 203.0 lb

## 2023-07-22 DIAGNOSIS — R3129 Other microscopic hematuria: Secondary | ICD-10-CM | POA: Diagnosis not present

## 2023-07-22 DIAGNOSIS — Z604 Social exclusion and rejection: Secondary | ICD-10-CM | POA: Diagnosis not present

## 2023-07-22 DIAGNOSIS — R1310 Dysphagia, unspecified: Secondary | ICD-10-CM | POA: Diagnosis not present

## 2023-07-22 DIAGNOSIS — Z79818 Long term (current) use of other agents affecting estrogen receptors and estrogen levels: Secondary | ICD-10-CM

## 2023-07-22 DIAGNOSIS — E785 Hyperlipidemia, unspecified: Secondary | ICD-10-CM | POA: Diagnosis not present

## 2023-07-22 DIAGNOSIS — Z945 Skin transplant status: Secondary | ICD-10-CM | POA: Diagnosis not present

## 2023-07-22 DIAGNOSIS — Z7901 Long term (current) use of anticoagulants: Secondary | ICD-10-CM | POA: Diagnosis not present

## 2023-07-22 DIAGNOSIS — M1990 Unspecified osteoarthritis, unspecified site: Secondary | ICD-10-CM | POA: Diagnosis not present

## 2023-07-22 DIAGNOSIS — Z483 Aftercare following surgery for neoplasm: Secondary | ICD-10-CM | POA: Diagnosis not present

## 2023-07-22 DIAGNOSIS — Z556 Problems related to health literacy: Secondary | ICD-10-CM | POA: Diagnosis not present

## 2023-07-22 DIAGNOSIS — C4371 Malignant melanoma of right lower limb, including hip: Secondary | ICD-10-CM | POA: Diagnosis not present

## 2023-07-22 DIAGNOSIS — N481 Balanitis: Secondary | ICD-10-CM | POA: Diagnosis not present

## 2023-07-22 DIAGNOSIS — R399 Unspecified symptoms and signs involving the genitourinary system: Secondary | ICD-10-CM | POA: Diagnosis not present

## 2023-07-22 DIAGNOSIS — N183 Chronic kidney disease, stage 3 unspecified: Secondary | ICD-10-CM | POA: Diagnosis not present

## 2023-07-22 DIAGNOSIS — K5900 Constipation, unspecified: Secondary | ICD-10-CM | POA: Diagnosis not present

## 2023-07-22 DIAGNOSIS — I129 Hypertensive chronic kidney disease with stage 1 through stage 4 chronic kidney disease, or unspecified chronic kidney disease: Secondary | ICD-10-CM | POA: Diagnosis not present

## 2023-07-22 DIAGNOSIS — C61 Malignant neoplasm of prostate: Secondary | ICD-10-CM | POA: Diagnosis not present

## 2023-07-22 DIAGNOSIS — Z9181 History of falling: Secondary | ICD-10-CM | POA: Diagnosis not present

## 2023-07-22 DIAGNOSIS — Z48298 Encounter for aftercare following other organ transplant: Secondary | ICD-10-CM | POA: Diagnosis not present

## 2023-07-22 DIAGNOSIS — Z79899 Other long term (current) drug therapy: Secondary | ICD-10-CM | POA: Diagnosis not present

## 2023-07-22 DIAGNOSIS — I4891 Unspecified atrial fibrillation: Secondary | ICD-10-CM | POA: Diagnosis not present

## 2023-07-22 LAB — URINALYSIS, ROUTINE W REFLEX MICROSCOPIC
Bilirubin, UA: NEGATIVE
Glucose, UA: NEGATIVE
Ketones, UA: NEGATIVE
Leukocytes,UA: NEGATIVE
Nitrite, UA: NEGATIVE
Protein,UA: NEGATIVE
RBC, UA: NEGATIVE
Specific Gravity, UA: 1.015 (ref 1.005–1.030)
Urobilinogen, Ur: 0.2 mg/dL (ref 0.2–1.0)
pH, UA: 5.5 (ref 5.0–7.5)

## 2023-07-22 MED ORDER — LEUPROLIDE ACETATE (6 MONTH) 45 MG ~~LOC~~ KIT
45.0000 mg | PACK | Freq: Once | SUBCUTANEOUS | Status: AC
  Administered 2023-07-22: 45 mg via SUBCUTANEOUS

## 2023-07-22 MED ORDER — CLOTRIMAZOLE-BETAMETHASONE 1-0.05 % EX CREA: 1.0000 | TOPICAL_CREAM | Freq: Two times a day (BID) | CUTANEOUS | 2 refills | Status: DC

## 2023-07-22 NOTE — Progress Notes (Signed)
 Eligard  SubQ Injection   Due to Prostate Cancer patient is present today for a Eligard  Injection.  Medication: Eligard  6 month Dose: 45 mg  Location: right upper outer buttocks  Lot: [id number] Exp: 09/2024  Patient tolerated well, no complications were noted  Performed by: Roselee CMA   PA approval dates:  07/22/23 - 07/21/24 Auth # [id number]

## 2023-07-22 NOTE — Telephone Encounter (Addendum)
 Patient's wife reports he takes Fluoxetine  20 mg twice daily.

## 2023-07-22 NOTE — Progress Notes (Signed)
 Assessment: 1. Prostate cancer (HCC)   2. Encounter for monitoring androgen deprivation therapy   3. Lower urinary tract symptoms (LUTS)   4. Microscopic hematuria   5. Balanitis     Plan: I personally reviewed the patient's chart including provider notes, lab and imaging results. Eligard  73-month depot given today.  PSA today Continue tamsulosin . Continue Lotrisone  prn - refill sent Return to office in 6 months  Chief Complaint: Chief Complaint  Patient presents with   Prostate Cancer    HPI: David [name] is a 78 y.o. male who presents for continued evaluation of recurrent prostate cancer, microscopic hematuria, lower urinary tract symptoms, and balanitis. He has previously been followed by Dr. Shona and was last seen in March 2025.  Recurrent Prostate cancer: Patient has a history of prostate cancer initially diagnosed 03/2019--Gleason 3+4 =7 with pretreatment PSA of 5.53. Patient underwent LDR implant 07/2019. Nadir PSA was 2.46 January 2022.  His PSA continued to rise and a PSMA PET scan 10/2020 showed prostatic uptake only.  Biopsy however was negative at that time.  Follow-up PSA rose again to 4.9 in 12/2021.  Repeat PET scan at that time showed persistent tumor in the prostate and on biopsy Gleason 4+4 disease was present in all 12 cores. PSA at that time 7.1   05/2022.   Patient started on ADT with Eligard  07/15/2022-- with plan for minimum of 18 months/preferably 24 months Completed salvage RT 11/08/2022. He received Eligard  on 02/03/23.  PSA 1/25:  <0.1  Microscopic Hematuria: He has a history of microscopic hematuria. CT without contrast from 10/24 did not demonstrate any significant upper tract abnormality. Cystoscopy from 3/25 showed mild lateral lobe enlargement of the prostate with some friability along the prostatic urethra, bladder without mucosal lesions. Hematuria profile from 3/25 was negative for malignant cells or dysplasia; isomorphic erythrocytes  indicating lower urinary tract/uroepithelial bleeding noted. U/A from 07/14/23:  negative  Recurrent  Balanitis: He has a history of recurrent balanitis which has responded to Lotrisone  cream.   He reports some intermittent irritation with redness.  He continues to use Lotrisone .  LUTS: He has a history of lower urinary tract symptoms.  He has been managed with tamsulosin . His lower urinary tract symptoms remain fairly stable.  No dysuria or gross hematuria. IPSS = 9/3.  Patient has a long history of recurrent stone disease complicated by development of right ureteral stricture and nonfunctioning right kidney status post robotic assisted laparoscopic nephrectomy in 2020.   Portions of the above documentation were copied from a prior visit for review purposes only.  Allergies: Allergies  Allergen Reactions   Cefaclor Hives   Cephalosporins Hives   Gabapentin  Other (See Comments)   Penicillins Rash    Mild maculopapular rash Has patient had a PCN reaction causing immediate rash, facial/tongue/throat swelling, SOB or lightheadedness with hypotension: No Has patient had a PCN reaction causing severe rash involving mucus membranes or skin necrosis: No Has patient had a PCN reaction that required hospitalization: No Has patient had a PCN reaction occurring within the last 10 years: No If all of the above answers are NO, then may proceed with Cephalosporin use.     PMH: Past Medical History:  Diagnosis Date   Adenoma of right adrenal gland 07/11/2002   2.4cm , noted on CT ABD   Anxiety    Arthritis of both knees 03/26/2016   Astigmatism    Back pain    BCC (basal cell carcinoma of skin)    under  right eye and right ear   Blood transfusion without reported diagnosis    Cataract 09/29/2016   Depression    Diverticulitis 2009   Diverticulosis    Family history of breast cancer    Family history of colon cancer    Fatty liver    GERD (gastroesophageal reflux disease)    Gout     Hearing loss of both ears 03/26/2016   History of atrial fibrillation    x 2 none since 2020   History of chronic prostatitis    started at age 9   History of kidney stones    Hx of adenomatous colonic polyps 02/16/2022   3 diminutive - no recall   Hyperglycemia    Hyperlipidemia    Hypertension    Incomplete right bundle branch block (RBBB)    Internal hemorrhoids    Kidney lesion 06/07/2015   Right midportion, 1.1x1.1 cm hyperechoic, noted on US  ABD   LAFB (left anterior fascicular block)    Lipoma of axilla 09/2016   Liver lesion, right lobe 06/07/2015   2.5x2.4x2.4 cm hypoechoic lesion posterior aspect, noted on US  ABD   Low back pain 03/26/2016   LVH (left ventricular hypertrophy) 08/06/2017   Moderate, Noted on ECHO   Medicare annual wellness visit, subsequent 03/12/2014   OA (osteoarthritis)    Back, Hands, Neck   Obesity 11/25/2007   Qualifier: Diagnosis of  By: Christi MD, Glendia HERO    Other malaise and fatigue 03/13/2013   Premature ventricular complex    Prostate cancer (HCC) dx 2021   Renal insufficiency    Kidney removed right   Scoliosis    Upper thoracic and lumbar   Sinus bradycardia 08/06/2017   Noted on EKG   Vitamin D  deficiency    resolved    PSH: Past Surgical History:  Procedure Laterality Date   CARDIOVERSION  07/31/2017   CATARACT EXTRACTION, BILATERAL     COLON SURGERY  2009   segmental sigmoid resection   COLONOSCOPY     COLONOSCOPY WITH PROPOFOL      CYSTOSCOPY N/A 07/28/2019   Procedure: CYSTOSCOPY FLEXIBLE;  Surgeon: Sherrilee Belvie CROME, MD;  Location: [facility];  Service: Urology;  Laterality: N/A;   CYSTOSCOPY W/ RETROGRADES Right 06/07/2018   Procedure: CYSTOSCOPY WITH RETROGRADE PYELOGRAM, uphrostogram;  Surgeon: Sherrilee Belvie CROME, MD;  Location: [facility];  Service: Urology;  Laterality: Right;   CYSTOSCOPY WITH URETEROSCOPY AND STENT PLACEMENT Right 02/28/2018   Procedure: CYSTOSCOPY WITH URETEROSCOPY ONNIE;   Surgeon: Ottelin, Mark, MD;  Location: [facility];  Service: Urology;  Laterality: Right;   CYSTOSCOPY/URETEROSCOPY/HOLMIUM LASER/STENT PLACEMENT Right 11/29/2017   Procedure: CYSTOSCOPY/RETROGRADE/URETEROSCOPY/HOLMIUM LASER/STENT PLACEMENT;  Surgeon: Ottelin, Mark, MD;  Location: [facility];  Service: Urology;  Laterality: Right;   EYE SURGERY Bilateral 01/12/2017   cataract removal   history of blood tranfusion  age 34   HOLMIUM LASER APPLICATION Right 09/24/2017   Procedure: HOLMIUM LASER APPLICATION;  Surgeon: Ottelin, Mark, MD;  Location: [facility];  Service: Urology;  Laterality: Right;   IR BALLOON DILATION URETERAL STRICTURE RIGHT  03/14/2018   IR NEPHRO TUBE REMOV/FL  03/17/2018   IR NEPHROSTOGRAM RIGHT THRU EXISTING ACCESS  03/17/2018   IR NEPHROSTOMY EXCHANGE RIGHT  03/14/2018   IR NEPHROSTOMY EXCHANGE RIGHT  06/21/2018   IR NEPHROSTOMY PLACEMENT RIGHT  03/02/2018   IR NEPHROSTOMY PLACEMENT RIGHT  04/20/2018   IR URETERAL STENT PLACEMENT EXISTING ACCESS RIGHT  03/14/2018   NOSE SURGERY  Submucous resection age 3   NUCLEAR STRESS TEST  06/03/2009   RADIOACTIVE SEED IMPLANT N/A 07/28/2019   Procedure: RADIOACTIVE SEED IMPLANT/BRACHYTHERAPY IMPLANT;  Surgeon: Sherrilee Belvie CROME, MD;  Location: [facility];  Service: Urology;  Laterality: N/A;  90 MINS   ROBOT ASSISTED LAPAROSCOPIC NEPHRECTOMY Right 08/04/2018   Procedure: XI ROBOTIC ASSISTED LAPAROSCOPIC NEPHRECTOMY;  Surgeon: Sherrilee Belvie CROME, MD;  Location: [facility];  Service: Urology;  Laterality: Right;  3 HRS   ROBOT ASSISTED PYELOPLASTY Right 06/07/2018   Procedure: attempted XI ROBOTIC ASSISTED PYELOPLASTY, lysis of adhesions;  Surgeon: Sherrilee Belvie CROME, MD;  Location: [facility];  Service: Urology;  Laterality: Right;  3 HRS   SKIN BIOPSY     SPACE OAR INSTILLATION N/A 07/28/2019   Procedure: SPACE OAR INSTILLATION;  Surgeon: Sherrilee Belvie CROME, MD;  Location: [facility];  Service: Urology;  Laterality: N/A;   URETEROSCOPY WITH HOLMIUM LASER LITHOTRIPSY Right 09/24/2017   Procedure: CYSTOSCOPY, URETEROSCOPY WITH HOLMIUM LASER LITHOTRIPSY, STENT PLACEMENT;  Surgeon: Ottelin, Mark, MD;  Location: [facility];  Service: Urology;  Laterality: Right;    SH: Social History   Tobacco Use   Smoking status: Former    Current packs/day: 0.00    Average packs/day: 1 pack/day for 6.0 years (6.0 ttl pk-yrs)    Types: Cigarettes    Start date: 01/11/1969    Quit date: 01/12/1969    Years since quitting: 54.5   Smokeless tobacco: Never   Tobacco comments:    1965-1971  Vaping Use   Vaping status: Never Used  Substance Use Topics   Alcohol use: Not Currently    Comment: 2 whiskey drinks per day   Drug use: Never    ROS: Constitutional:  Negative for fever, chills, weight loss CV: Negative for chest pain, previous MI, hypertension Respiratory:  Negative for shortness of breath, wheezing, sleep apnea, frequent cough GI:  Negative for nausea, vomiting, bloody stool, GERD  PE: BP 121/63   Pulse (!) 58   Ht 5' 11 (1.803 m)   Wt 203 lb (92.1 kg)   BMI 28.31 kg/m  GENERAL APPEARANCE:  Well appearing, well developed, well nourished, NAD HEENT:  Atraumatic, normocephalic, oropharynx clear NECK:  Supple without lymphadenopathy or thyromegaly ABDOMEN:  Soft, non-tender, no masses EXTREMITIES:  Moves all extremities well, without clubbing, cyanosis, or edema NEUROLOGIC:  Alert and oriented x 3, CN II-XII grossly intact MENTAL STATUS:  appropriate BACK:  Non-tender to palpation, No CVAT SKIN:  Warm, dry, and intact GU:  redundant foreskin with phimosis; no significant balanitis   Results: Results for orders placed or performed in visit on 07/22/23 (from the past 24 hours)  Urinalysis, Routine w reflex microscopic     Status: None   Collection Time: 07/22/23 12:00 AM  Result Value Ref Range   Specific Gravity, UA 1.015 1.005 -  1.030   pH, UA 5.5 5.0 - 7.5   Color, UA Yellow Yellow   Appearance Ur Clear Clear   Leukocytes,UA Negative Negative   Protein,UA Negative Negative/Trace   Glucose, UA Negative Negative   Ketones, UA Negative Negative   RBC, UA Negative Negative   Bilirubin, UA Negative Negative   Urobilinogen, Ur 0.2 0.2 - 1.0 mg/dL   Nitrite, UA Negative Negative   Microscopic Examination Comment    Narrative   Performed at:  01 - [email or web address]  [facility] [address], [place], [place]  [id number] Lab Director: Greig [place]  [place], Phone:  [phone]   *Note: Due to a large number of results and/or encounters for the requested time period, some results have not been displayed. A complete set of results can be found in Results Review.

## 2023-07-22 NOTE — Telephone Encounter (Signed)
 David [name]  w/ [facility] was advised it is fine to move forward with PT orders for  twice a week for 8 weeks

## 2023-07-23 ENCOUNTER — Ambulatory Visit: Payer: Self-pay | Admitting: Urology

## 2023-07-23 LAB — PSA: Prostate Specific Ag, Serum: <0.1 ng/mL (ref 0.0–4.0)

## 2023-07-23 NOTE — Telephone Encounter (Signed)
 Patient's why was advised and stated that patient is during a lot better since she have made sure he is getting poor sleep. She states going try the Prozac  20 mg tid to see how patient does if he gets agitated again.

## 2023-07-27 DIAGNOSIS — C4371 Malignant melanoma of right lower limb, including hip: Secondary | ICD-10-CM | POA: Diagnosis not present

## 2023-07-27 DIAGNOSIS — E785 Hyperlipidemia, unspecified: Secondary | ICD-10-CM | POA: Diagnosis not present

## 2023-07-27 DIAGNOSIS — K5900 Constipation, unspecified: Secondary | ICD-10-CM | POA: Diagnosis not present

## 2023-07-27 DIAGNOSIS — Z483 Aftercare following surgery for neoplasm: Secondary | ICD-10-CM | POA: Diagnosis not present

## 2023-07-27 DIAGNOSIS — Z48298 Encounter for aftercare following other organ transplant: Secondary | ICD-10-CM | POA: Diagnosis not present

## 2023-07-27 DIAGNOSIS — Z79899 Other long term (current) drug therapy: Secondary | ICD-10-CM | POA: Diagnosis not present

## 2023-07-27 DIAGNOSIS — Z7901 Long term (current) use of anticoagulants: Secondary | ICD-10-CM | POA: Diagnosis not present

## 2023-07-27 DIAGNOSIS — I129 Hypertensive chronic kidney disease with stage 1 through stage 4 chronic kidney disease, or unspecified chronic kidney disease: Secondary | ICD-10-CM | POA: Diagnosis not present

## 2023-07-27 DIAGNOSIS — M1990 Unspecified osteoarthritis, unspecified site: Secondary | ICD-10-CM | POA: Diagnosis not present

## 2023-07-27 DIAGNOSIS — R1310 Dysphagia, unspecified: Secondary | ICD-10-CM | POA: Diagnosis not present

## 2023-07-27 DIAGNOSIS — N183 Chronic kidney disease, stage 3 unspecified: Secondary | ICD-10-CM | POA: Diagnosis not present

## 2023-07-27 DIAGNOSIS — Z945 Skin transplant status: Secondary | ICD-10-CM | POA: Diagnosis not present

## 2023-07-27 DIAGNOSIS — Z9181 History of falling: Secondary | ICD-10-CM | POA: Diagnosis not present

## 2023-07-27 DIAGNOSIS — Z556 Problems related to health literacy: Secondary | ICD-10-CM | POA: Diagnosis not present

## 2023-07-27 DIAGNOSIS — Z604 Social exclusion and rejection: Secondary | ICD-10-CM | POA: Diagnosis not present

## 2023-07-27 DIAGNOSIS — I4891 Unspecified atrial fibrillation: Secondary | ICD-10-CM | POA: Diagnosis not present

## 2023-07-27 NOTE — Progress Notes (Addendum)
 Plastic Surgery Clinic Note ~~~~~~~~~~~~~~~~~~~~~~~~~~~~~~~~~~~~~~~~~~~~~~~~~~~~~~~~~~~ Surgery: 06/24/2023: STSG to right foot wound, donor site right calf   LCV Assessment/Plan:  6/25: Cast replaced today with xeroform dressing to graft site and donor site A:  Satisfactory course.  P: wear cast one more week and then return for re-evaluation  Cast replaced due to fragile nature of skin graft and location- no weight bearing for the next week. Will re-evaluate status at next clinic visit.  ~~~~~~~~~~~~~~~~~~~~~~~~~~~~~~~~~~~~~~~~~~~~~~~~~~~~~~~~~~~ Subjective:  David [name] is a 78 y.o. male who presents in follow-up for right foot wound s/p STSG on right foot. Since last visit he couldn't tolerate the cast and had it removed at an ortho clinic. He has not yet gotten fitted for an orthotic shoe.   HPI: David [name] is a 78 yo male with history of Alzheimer's, NASH, HTN, HLD, afib on eliquis , right foot melanoma excision by Dr. Debora followed by endoform matrix placement by Dr. Arvis 06/10/23.  Underwent 06/24/23 PROCEDURE with Dr. Arvis:  Placement of split thickness skin graft to the right foot 5 x 5 cm.   Objective: Physical BP 132/66   Pulse 54   Temp 97.7 F (36.5 C) (Temporal)   Ht 1.803 m (5' 11)   Wt 98.4 kg (217 lb)   BMI 30.27 kg/m   General: NAD, AAO Right foot: wound s/p STSG well healed. Small area of hypergranulation tissue.   Donor site well healed.   Assessment/Plan: s/p STSG to wound of right sole on 06/24/23. Healing well.   - Reinforced importance of pressure off loading - Printed order for DME and gave information for [place] [facility] to have shoe made - As patient is unwilling to have off loading cast, will continue post op shoe and pressure avoidance until orthotic fitting - Follow up as needed if surgical site opens up  [place] K. Gasper, MD Plastic and Reconstructive Surgery I discussed with the resident and agree with theplan.

## 2023-07-28 ENCOUNTER — Ambulatory Visit (INDEPENDENT_AMBULATORY_CARE_PROVIDER_SITE_OTHER): Admitting: Orthopedic Surgery

## 2023-07-28 VITALS — BP 128/61 | HR 54 | Ht 71.0 in | Wt 203.0 lb

## 2023-07-28 DIAGNOSIS — K5900 Constipation, unspecified: Secondary | ICD-10-CM | POA: Diagnosis not present

## 2023-07-28 DIAGNOSIS — I129 Hypertensive chronic kidney disease with stage 1 through stage 4 chronic kidney disease, or unspecified chronic kidney disease: Secondary | ICD-10-CM | POA: Diagnosis not present

## 2023-07-28 DIAGNOSIS — Z48298 Encounter for aftercare following other organ transplant: Secondary | ICD-10-CM | POA: Diagnosis not present

## 2023-07-28 DIAGNOSIS — M1990 Unspecified osteoarthritis, unspecified site: Secondary | ICD-10-CM | POA: Diagnosis not present

## 2023-07-28 DIAGNOSIS — C4371 Malignant melanoma of right lower limb, including hip: Secondary | ICD-10-CM | POA: Diagnosis not present

## 2023-07-28 DIAGNOSIS — Z79899 Other long term (current) drug therapy: Secondary | ICD-10-CM | POA: Diagnosis not present

## 2023-07-28 DIAGNOSIS — Z945 Skin transplant status: Secondary | ICD-10-CM | POA: Diagnosis not present

## 2023-07-28 DIAGNOSIS — N183 Chronic kidney disease, stage 3 unspecified: Secondary | ICD-10-CM | POA: Diagnosis not present

## 2023-07-28 DIAGNOSIS — I4891 Unspecified atrial fibrillation: Secondary | ICD-10-CM | POA: Diagnosis not present

## 2023-07-28 DIAGNOSIS — M5416 Radiculopathy, lumbar region: Secondary | ICD-10-CM

## 2023-07-28 DIAGNOSIS — Z9181 History of falling: Secondary | ICD-10-CM | POA: Diagnosis not present

## 2023-07-28 DIAGNOSIS — Z604 Social exclusion and rejection: Secondary | ICD-10-CM | POA: Diagnosis not present

## 2023-07-28 DIAGNOSIS — Z7901 Long term (current) use of anticoagulants: Secondary | ICD-10-CM | POA: Diagnosis not present

## 2023-07-28 DIAGNOSIS — Z483 Aftercare following surgery for neoplasm: Secondary | ICD-10-CM | POA: Diagnosis not present

## 2023-07-28 DIAGNOSIS — Z556 Problems related to health literacy: Secondary | ICD-10-CM | POA: Diagnosis not present

## 2023-07-28 DIAGNOSIS — E785 Hyperlipidemia, unspecified: Secondary | ICD-10-CM | POA: Diagnosis not present

## 2023-07-28 DIAGNOSIS — R1310 Dysphagia, unspecified: Secondary | ICD-10-CM | POA: Diagnosis not present

## 2023-07-29 DIAGNOSIS — Z48298 Encounter for aftercare following other organ transplant: Secondary | ICD-10-CM | POA: Diagnosis not present

## 2023-07-29 DIAGNOSIS — N183 Chronic kidney disease, stage 3 unspecified: Secondary | ICD-10-CM | POA: Diagnosis not present

## 2023-07-29 DIAGNOSIS — Z604 Social exclusion and rejection: Secondary | ICD-10-CM | POA: Diagnosis not present

## 2023-07-29 DIAGNOSIS — Z945 Skin transplant status: Secondary | ICD-10-CM | POA: Diagnosis not present

## 2023-07-29 DIAGNOSIS — I129 Hypertensive chronic kidney disease with stage 1 through stage 4 chronic kidney disease, or unspecified chronic kidney disease: Secondary | ICD-10-CM | POA: Diagnosis not present

## 2023-07-29 DIAGNOSIS — R1310 Dysphagia, unspecified: Secondary | ICD-10-CM | POA: Diagnosis not present

## 2023-07-29 DIAGNOSIS — Z556 Problems related to health literacy: Secondary | ICD-10-CM | POA: Diagnosis not present

## 2023-07-29 DIAGNOSIS — M1990 Unspecified osteoarthritis, unspecified site: Secondary | ICD-10-CM | POA: Diagnosis not present

## 2023-07-29 DIAGNOSIS — K5900 Constipation, unspecified: Secondary | ICD-10-CM | POA: Diagnosis not present

## 2023-07-29 DIAGNOSIS — I4891 Unspecified atrial fibrillation: Secondary | ICD-10-CM | POA: Diagnosis not present

## 2023-07-29 DIAGNOSIS — Z9181 History of falling: Secondary | ICD-10-CM | POA: Diagnosis not present

## 2023-07-29 DIAGNOSIS — E785 Hyperlipidemia, unspecified: Secondary | ICD-10-CM | POA: Diagnosis not present

## 2023-07-29 DIAGNOSIS — Z483 Aftercare following surgery for neoplasm: Secondary | ICD-10-CM | POA: Diagnosis not present

## 2023-07-29 DIAGNOSIS — Z79899 Other long term (current) drug therapy: Secondary | ICD-10-CM | POA: Diagnosis not present

## 2023-07-29 DIAGNOSIS — C4371 Malignant melanoma of right lower limb, including hip: Secondary | ICD-10-CM | POA: Diagnosis not present

## 2023-07-29 DIAGNOSIS — Z7901 Long term (current) use of anticoagulants: Secondary | ICD-10-CM | POA: Diagnosis not present

## 2023-07-29 NOTE — Progress Notes (Signed)
 Orthopedic Spine Surgery Office Note  Assessment: Patient is a 78 y.o. male with significant weakness in both his hip abductors and dorsiflexors of the foot on the left side.  Has bilateral pars defects and a spondylolisthesis causing bilateral foraminal stenosis   Plan: -I spent significant time talking about treatment options.  Since his main issue is imbalance and the weakness both in his hip abductors and in his foot, I do not see any medications that would provide him with benefit.  His options would be functional bracing or surgical.  Given the severity and duration of his foot drop, I do not expect surgery to provide him with meaningful recovery.  So his option for that would be a foot drop brace.  Prescription provided to him for that today.  His hip abductor weakness is causing a Trendelenburg gait and may also be contributing to his imbalance.  Since he still has some function in the abductors, surgery may give him some improvement with that.  I told him to trial the foot drop brace to see how much function he remains with that.  He should also work with physical therapy.  Referral provided to him today.  If he is still dissatisfied with where he is at, we could revisit the idea of a L5/S1 ALIF and PSIF to decompress his L5 nerve root.  However, I explained that this would be a back surgery that would take roughly 5 hours so would come with potential for risk with the possibility that he is not regain enough strength to give him a mobility back that he desires -Patient should return to office in 4 weeks, x-rays at next visit: none   Patient expressed understanding of the plan and all questions were answered to the patient's satisfaction.   ___________________________________________________________________________   History:  Patient is a 78 y.o. male who presents today for lumbar spine.  Patient initially had pain radiating into his left lower extremity.  His radiating pain has gotten  better with time.  However, he has noticed weakness in his foot for the last 5 months.  It has gotten progressively worse with time.  He has had also difficulty with ambulating since his weakness started.  He no longer is able to ambulate very long distances.  He has had to have a lot of assistance  to get around.  There is no trauma or injury that preceded the onset of his symptoms.  He has not tried a foot drop brace.   Weakness: Yes, has a foot drop.  No other weakness noticed Symptoms of imbalance: Yes, feels off balance and has difficulty walking Paresthesias and numbness: Denies Bowel or bladder incontinence: Patient has a history of urinary incontinence.  No recent changes in bowel or bladder habits Saddle anesthesia: Denies  Treatments tried: PT, Tylenol   Review of systems: Denies fevers and chills, night sweats, unexplained weight loss, pain that wakes them at night.  Has a history of prostate cancer  Past medical history: Diverticulitis Alzheimer's Prostate cancer Depression/anxiety CKD HLD HTN Atrial fibrillation GERD  Allergies: cefaclor, cephalosporins, gabapentin , penicillin  Past surgical history:  Cardioversion Cataract surgery Ureter stent placement Nephrectomy Radioactive seed implant  Social history: Denies use of nicotine product (smoking, vaping, patches, smokeless) Alcohol use: Rare Denies recreational drug use   Physical Exam:  BMI of 28.3  General: no acute distress, appears stated age Neurologic: alert, answering questions appropriately, following commands Respiratory: unlabored breathing on room air, symmetric chest rise Psychiatric: appropriate affect, normal cadence  to speech   MSK (spine):  -Strength exam      Left  Right EHL    0/5  5/5 TA    0/5  5/5 GSC    5/5  5/5 Knee extension  5/5  5/5 Hip flexion   5/5  5/5  Left side hip abductor weakness with 2/5 strength  -Sensory exam    Sensation intact to light touch in L3-S1  nerve distributions of bilateral lower extremities  -Achilles DTR: 1/4 on the left, 1/4 on the right -Patellar tendon DTR: 1/4 on the left, 1/4 on the right  -Straight leg raise: Negative bilaterally -Clonus: no beats bilaterally  -Unsteadiness with gait, has a high steppage gait to clear the left foot, also has a Trendelenburg gait  -Left hip exam: No pain through range of motion -Right hip exam: No pain through range of motion  Imaging: MRI of the lumbar spine from 04/18/2023 was independently reviewed and interpreted, showing DDD at L2/3 and L5/S1.  Right-sided foraminal stenosis at L4/5.  Bilateral pars defects and bilateral foraminal stenosis at L5/S1.  No other significant stenosis seen.   Patient name: David [name] Patient MRN: [id number] Date of visit: 07/28/2023  I spent 30 minutes in the room with the patient and his son going over his exam and imaging.  I spent a lot of time talking about treatment options.  Given that his symptom is weakness and imbalance, I explained that his options are mostly bracing/ambulatory aids or surgical.  Medications do not typically help with weakness or imbalance issues.  I tried to set realistic expectations about surgery.  I told him given the extent and duration of his foot drop, I do not expect him to regain significant function.  In a best case scenario he may get antigravity strength but even that I am not sure what happened.  He does still have the ability to fire his hip abductors so he may get some improvement in that with surgery.

## 2023-08-01 NOTE — Assessment & Plan Note (Signed)
 Encourage heart healthy diet such as MIND or DASH diet, increase exercise, avoid trans fats, simple carbohydrates and processed foods, consider a krill or fish or flaxseed oil cap daily.

## 2023-08-01 NOTE — Assessment & Plan Note (Signed)
 Patient encouraged to maintain heart healthy diet, regular exercise, adequate sleep. Consider daily probiotics. Take medications as prescribed. Labs ordered and reviewed. Colonoscopy 2024 has aged out. Given and reviewed copy of ACP documents from [employer] and encouraged to complete and return

## 2023-08-01 NOTE — Assessment & Plan Note (Signed)
 Well controlled, no changes to meds. Encouraged heart healthy diet such as the DASH diet and exercise as tolerated.

## 2023-08-01 NOTE — Assessment & Plan Note (Signed)
 Hydrate and monitor

## 2023-08-01 NOTE — Assessment & Plan Note (Signed)
 hgba1c acceptable, minimize simple carbs. Increase exercise as tolerated.

## 2023-08-01 NOTE — Assessment & Plan Note (Signed)
 Hydrate and monitor. Sees nephrology.

## 2023-08-02 ENCOUNTER — Ambulatory Visit: Payer: Self-pay | Admitting: Family Medicine

## 2023-08-02 ENCOUNTER — Ambulatory Visit (INDEPENDENT_AMBULATORY_CARE_PROVIDER_SITE_OTHER): Admitting: Family Medicine

## 2023-08-02 ENCOUNTER — Encounter: Payer: Self-pay | Admitting: Family Medicine

## 2023-08-02 VITALS — BP 128/68 | HR 60 | Resp 16 | Ht 71.0 in

## 2023-08-02 DIAGNOSIS — M1A9XX0 Chronic gout, unspecified, without tophus (tophi): Secondary | ICD-10-CM | POA: Diagnosis not present

## 2023-08-02 DIAGNOSIS — R21 Rash and other nonspecific skin eruption: Secondary | ICD-10-CM

## 2023-08-02 DIAGNOSIS — R252 Cramp and spasm: Secondary | ICD-10-CM

## 2023-08-02 DIAGNOSIS — R739 Hyperglycemia, unspecified: Secondary | ICD-10-CM

## 2023-08-02 DIAGNOSIS — E782 Mixed hyperlipidemia: Secondary | ICD-10-CM | POA: Diagnosis not present

## 2023-08-02 DIAGNOSIS — F03918 Unspecified dementia, unspecified severity, with other behavioral disturbance: Secondary | ICD-10-CM

## 2023-08-02 DIAGNOSIS — I10 Essential (primary) hypertension: Secondary | ICD-10-CM | POA: Diagnosis not present

## 2023-08-02 DIAGNOSIS — D649 Anemia, unspecified: Secondary | ICD-10-CM

## 2023-08-02 DIAGNOSIS — M545 Low back pain, unspecified: Secondary | ICD-10-CM

## 2023-08-02 DIAGNOSIS — N189 Chronic kidney disease, unspecified: Secondary | ICD-10-CM

## 2023-08-02 DIAGNOSIS — Z0000 Encounter for general adult medical examination without abnormal findings: Secondary | ICD-10-CM | POA: Diagnosis not present

## 2023-08-02 LAB — COMPREHENSIVE METABOLIC PANEL WITH GFR
ALT: 15 U/L (ref 0–53)
AST: 19 U/L (ref 0–37)
Albumin: 3.8 g/dL (ref 3.5–5.2)
Alkaline Phosphatase: 69 U/L (ref 39–117)
BUN: 21 mg/dL (ref 6–23)
CO2: 29 meq/L (ref 19–32)
Calcium: 10.2 mg/dL (ref 8.4–10.5)
Chloride: 104 meq/L (ref 96–112)
Creatinine, Ser: 1.46 mg/dL (ref 0.40–1.50)
GFR: 45.87 mL/min — ABNORMAL LOW (ref >60.00)
Glucose, Bld: 94 mg/dL (ref 70–99)
Potassium: 4.2 meq/L (ref 3.5–5.1)
Sodium: 139 meq/L (ref 135–145)
Total Bilirubin: 0.7 mg/dL (ref 0.2–1.2)
Total Protein: 5.8 g/dL — ABNORMAL LOW (ref 6.0–8.3)

## 2023-08-02 LAB — TSH: TSH: 2.42 u[IU]/mL (ref 0.35–5.50)

## 2023-08-02 LAB — CBC WITH DIFFERENTIAL/PLATELET
Basophils Absolute: 0 K/uL (ref 0.0–0.1)
Basophils Relative: 0.8 % (ref 0.0–3.0)
Eosinophils Absolute: 0.2 K/uL (ref 0.0–0.7)
Eosinophils Relative: 5.8 % — ABNORMAL HIGH (ref 0.0–5.0)
HCT: 32 % — ABNORMAL LOW (ref 39.0–52.0)
Hemoglobin: 10.7 g/dL — ABNORMAL LOW (ref 13.0–17.0)
Lymphocytes Relative: 16.8 % (ref 12.0–46.0)
Lymphs Abs: 0.6 K/uL — ABNORMAL LOW (ref 0.7–4.0)
MCHC: 33.5 g/dL (ref 30.0–36.0)
MCV: 96.7 fl (ref 78.0–100.0)
Monocytes Absolute: 0.4 K/uL (ref 0.1–1.0)
Monocytes Relative: 9.8 % (ref 3.0–12.0)
Neutro Abs: 2.6 K/uL (ref 1.4–7.7)
Neutrophils Relative %: 66.8 % (ref 43.0–77.0)
Platelets: 179 K/uL (ref 150.0–400.0)
RBC: 3.31 Mil/uL — ABNORMAL LOW (ref 4.22–5.81)
RDW: 15.1 % (ref 11.5–15.5)
WBC: 3.8 K/uL — ABNORMAL LOW (ref 4.0–10.5)

## 2023-08-02 LAB — LIPID PANEL
Cholesterol: 131 mg/dL (ref 0–200)
HDL: 43.4 mg/dL (ref >39.00)
LDL Cholesterol: 71 mg/dL (ref 0–99)
NonHDL: 88
Total CHOL/HDL Ratio: 3
Triglycerides: 83 mg/dL (ref 0.0–149.0)
VLDL: 16.6 mg/dL (ref 0.0–40.0)

## 2023-08-02 LAB — HEMOGLOBIN A1C: Hgb A1c MFr Bld: 5.4 % (ref 4.6–6.5)

## 2023-08-02 NOTE — Patient Instructions (Addendum)
 Prevnar 20 booster Tetanus booster Shingrix is the new shingles shot, 2 shots over 2-6 months, confirm coverage with insurance and document, then can return here for shots with nurse appt or at pharmacy   At the pharmacy at [facility] walk in vaccines given 9-4 m-f  Feet up 15 minutes three x daily Hydate 80 ounces 24 hours, 5-10 ounces of water  every 1-2 hours  Multivitamin with minerals and fish oil daily  Protein every 3-4 hours and fresh fruits and veg   Add a fiber supplement such Metamucil or Benefiber  Encouraged increased hydration and fiber in diet. Daily probiotics. If bowels not moving can use MOM 2 tbls po in 4 oz of warm prune juice by mouth every 2-3 days. If no results then repeat in 4 hours with  Dulcolax suppository pr, may repeat again in 4 more hours as needed. Seek care if symptoms worsen. Consider daily Miralax and/or Dulcolax if symptoms persist.    Cognitive Behavioral Therapy insomnia (CBTi) [facility] has a free APP= CBTi coach Magnesium  Glycinate 200-400 mg at bedtime Can try Seroquel  25 to 50 mg at bedtime   Preventive Care 65 Years and Older, Male Preventive care refers to lifestyle choices and visits with your health care provider that can promote health and wellness. Preventive care visits are also called wellness exams. What can I expect for my preventive care visit? Counseling During your preventive care visit, your health care provider may ask about your: Medical history, including: Past medical problems. Family medical history. History of falls. Current health, including: Emotional well-being. Home life and relationship well-being. Sexual activity. Memory and ability to understand (cognition). Lifestyle, including: Alcohol, nicotine or tobacco, and drug use. Access to firearms. Diet, exercise, and sleep habits. Work and work Astronomer. Sunscreen use. Safety issues such as seatbelt and bike helmet use. Physical  exam Your health care provider will check your: Height and weight. These may be used to calculate your BMI (body mass index). BMI is a measurement that tells if you are at a healthy weight. Waist circumference. This measures the distance around your waistline. This measurement also tells if you are at a healthy weight and may help predict your risk of certain diseases, such as type 2 diabetes and high blood pressure. Heart rate and blood pressure. Body temperature. Skin for abnormal spots. What immunizations do I need?  Vaccines are usually given at various ages, according to a schedule. Your health care provider will recommend vaccines for you based on your age, medical history, and lifestyle or other factors, such as travel or where you work. What tests do I need? Screening Your health care provider may recommend screening tests for certain conditions. This may include: Lipid and cholesterol levels. Diabetes screening. This is done by checking your blood sugar (glucose) after you have not eaten for a while (fasting). Hepatitis C test. Hepatitis B test. HIV (human immunodeficiency virus) test. STI (sexually transmitted infection) testing, if you are at risk. Lung cancer screening. Colorectal cancer screening. Prostate cancer screening. Abdominal aortic aneurysm (AAA) screening. You may need this if you are a current or former smoker. Talk with your health care provider about your test results, treatment options, and if necessary, the need for more tests. Follow these instructions at home: Eating and drinking  Eat a diet that includes fresh fruits and vegetables, whole grains, lean protein, and low-fat dairy products. Limit your intake of foods with high amounts of sugar, saturated fats, and salt. Take vitamin  and mineral supplements as recommended by your health care provider. Do not drink alcohol if your health care provider tells you not to drink. If you drink alcohol: Limit how  much you have to 0-2 drinks a day. Know how much alcohol is in your drink. In the [place] one drink equals one 12 oz bottle of beer (355 mL), one 5 oz glass of wine (148 mL), or one 1 oz glass of hard liquor (44 mL). Lifestyle Brush your teeth every morning and night with fluoride toothpaste. Floss one time each day. Exercise for at least 30 minutes 5 or more days each week. Do not use any products that contain nicotine or tobacco. These products include cigarettes, chewing tobacco, and vaping devices, such as e-cigarettes. If you need help quitting, ask your health care provider. Do not use drugs. If you are sexually active, practice safe sex. Use a condom or other form of protection to prevent STIs. Take aspirin only as told by your health care provider. Make sure that you understand how much to take and what form to take. Work with your health care provider to find out whether it is safe and beneficial for you to take aspirin daily. Ask your health care provider if you need to take a cholesterol-lowering medicine (statin). Find healthy ways to manage stress, such as: Meditation, yoga, or listening to music. Journaling. Talking to a trusted person. Spending time with friends and family. Safety Always wear your seat belt while driving or riding in a vehicle. Do not drive: If you have been drinking alcohol. Do not ride with someone who has been drinking. When you are tired or distracted. While texting. If you have been using any mind-altering substances or drugs. Wear a helmet and other protective equipment during sports activities. If you have firearms in your house, make sure you follow all gun safety procedures. Minimize exposure to UV radiation to reduce your risk of skin cancer. What's next? Visit your health care provider once a year for an annual wellness visit. Ask your health care provider how often you should have your eyes and teeth checked. Stay up to date on all  vaccines. This information is not intended to replace advice given to you by your health care provider. Make sure you discuss any questions you have with your health care provider. Document Revised: 06/26/2020 Document Reviewed: 06/26/2020 Elsevier Patient Education  2024 [employer].

## 2023-08-02 NOTE — Progress Notes (Addendum)
 Subjective:    Patient ID: David [name], male    DOB: [birth year], 78 y.o.   MRN: [id number]  Chief Complaint  Patient presents with   Annual Exam    Patient presents today for a physical exam.   Quality Metric Gaps    TDAP, zoster    HPI Discussed the use of AI scribe software for clinical note transcription with the patient, who gave verbal consent to proceed.  History of Present Illness David [name] is a 78 year old male with prostate cancer and kidney stones who presents with worsening leg and back pain. He is accompanied by his son Penne. He is hoping to plan ahead for possible care needs for his father  He experiences aching pain in both legs, extending from the hips to the feet, which began approximately three months ago after a move involving lifting and twisting. The pain has persisted without improvement. He also has chronic lower back pain since 2016, which has worsened since the move.  He has a history of prostate cancer, treated with radiation therapy that concluded in February. He also has a persistent itchy rash for the past three years, unresponsive to corticosteroid creams, which began intermittently after kidney surgery. He has not consulted a dermatologist for this issue.  He has a history of kidney stones and underwent surgery to remove a kidney. He reports a weight loss from 217 pounds to 203 pounds over the past month, though he has not been weighed recently. He uses a wheelchair and limits walking due to a healing foot ulcer.  He experiences difficulty sleeping, characterized by restlessness and frequent awakenings. He takes lorazepam  and quetiapine  for sleep, but they are not consistently effective. Diagnosed with Alzheimer's disease in April, he experiences occasional hallucinations and agitation, particularly when sleep-deprived. Working [employer]   He experiences chronic constipation, with bowel movements every three days. He uses prune juice  and occasionally Miralax for management and acknowledges insufficient water  intake, which may contribute to constipation.  He has a history of shingles and received a shingles vaccine over ten years ago but has not received the newer Shingrix vaccine series. He also has not had a tetanus booster in over ten years.     Past Medical History:  Diagnosis Date   Adenoma of right adrenal gland 07/11/2002   2.4cm , noted on CT ABD   Anxiety    Arthritis of both knees 03/26/2016   Astigmatism    Back pain    BCC (basal cell carcinoma of skin)    under right eye and right ear   Blood transfusion without reported diagnosis    Cataract 09/29/2016   Depression    Diverticulitis 2009   Diverticulosis    Family history of breast cancer    Family history of colon cancer    Fatty liver    GERD (gastroesophageal reflux disease)    Gout    Hearing loss of both ears 03/26/2016   History of atrial fibrillation    x 2 none since 2020   History of chronic prostatitis    started at age 86   History of kidney stones    Hx of adenomatous colonic polyps 02/16/2022   3 diminutive - no recall   Hyperglycemia    Hyperlipidemia    Hypertension    Incomplete right bundle branch block (RBBB)    Internal hemorrhoids    Kidney lesion 06/07/2015   Right midportion, 1.1x1.1 cm hyperechoic, noted on US  ABD  LAFB (left anterior fascicular block)    Lipoma of axilla 09/2016   Liver lesion, right lobe 06/07/2015   2.5x2.4x2.4 cm hypoechoic lesion posterior aspect, noted on US  ABD   Low back pain 03/26/2016   LVH (left ventricular hypertrophy) 08/06/2017   Moderate, Noted on ECHO   Medicare annual wellness visit, subsequent 03/12/2014   OA (osteoarthritis)    Back, Hands, Neck   Obesity 11/25/2007   Qualifier: Diagnosis of  By: Christi MD, Glendia HERO    Other malaise and fatigue 03/13/2013   Premature ventricular complex    Prostate cancer (HCC) dx 2021   Renal insufficiency    Kidney removed right    Scoliosis    Upper thoracic and lumbar   Sinus bradycardia 08/06/2017   Noted on EKG   Vitamin D  deficiency    resolved    Past Surgical History:  Procedure Laterality Date   CARDIOVERSION  07/31/2017   CATARACT EXTRACTION, BILATERAL     COLON SURGERY  2009   segmental sigmoid resection   COLONOSCOPY     COLONOSCOPY WITH PROPOFOL      CYSTOSCOPY N/A 07/28/2019   Procedure: CYSTOSCOPY FLEXIBLE;  Surgeon: Sherrilee Belvie CROME, MD;  Location: [facility];  Service: Urology;  Laterality: N/A;   CYSTOSCOPY W/ RETROGRADES Right 06/07/2018   Procedure: CYSTOSCOPY WITH RETROGRADE PYELOGRAM, uphrostogram;  Surgeon: Sherrilee Belvie CROME, MD;  Location: [facility];  Service: Urology;  Laterality: Right;   CYSTOSCOPY WITH URETEROSCOPY AND STENT PLACEMENT Right 02/28/2018   Procedure: CYSTOSCOPY WITH URETEROSCOPY ONNIE;  Surgeon: Ottelin, Mark, MD;  Location: [facility];  Service: Urology;  Laterality: Right;   CYSTOSCOPY/URETEROSCOPY/HOLMIUM LASER/STENT PLACEMENT Right 11/29/2017   Procedure: CYSTOSCOPY/RETROGRADE/URETEROSCOPY/HOLMIUM LASER/STENT PLACEMENT;  Surgeon: Ottelin, Mark, MD;  Location: [facility];  Service: Urology;  Laterality: Right;   EYE SURGERY Bilateral 01/12/2017   cataract removal   history of blood tranfusion  age 13   HOLMIUM LASER APPLICATION Right 09/24/2017   Procedure: HOLMIUM LASER APPLICATION;  Surgeon: Ottelin, Mark, MD;  Location: [facility];  Service: Urology;  Laterality: Right;   IR BALLOON DILATION URETERAL STRICTURE RIGHT  03/14/2018   IR NEPHRO TUBE REMOV/FL  03/17/2018   IR NEPHROSTOGRAM RIGHT THRU EXISTING ACCESS  03/17/2018   IR NEPHROSTOMY EXCHANGE RIGHT  03/14/2018   IR NEPHROSTOMY EXCHANGE RIGHT  06/21/2018   IR NEPHROSTOMY PLACEMENT RIGHT  03/02/2018   IR NEPHROSTOMY PLACEMENT RIGHT  04/20/2018   IR URETERAL STENT PLACEMENT EXISTING ACCESS RIGHT  03/14/2018   NOSE SURGERY     Submucous resection age 77    NUCLEAR STRESS TEST  06/03/2009   RADIOACTIVE SEED IMPLANT N/A 07/28/2019   Procedure: RADIOACTIVE SEED IMPLANT/BRACHYTHERAPY IMPLANT;  Surgeon: Sherrilee Belvie CROME, MD;  Location: [facility];  Service: Urology;  Laterality: N/A;  90 MINS   ROBOT ASSISTED LAPAROSCOPIC NEPHRECTOMY Right 08/04/2018   Procedure: XI ROBOTIC ASSISTED LAPAROSCOPIC NEPHRECTOMY;  Surgeon: Sherrilee Belvie CROME, MD;  Location: [facility];  Service: Urology;  Laterality: Right;  3 HRS   ROBOT ASSISTED PYELOPLASTY Right 06/07/2018   Procedure: attempted XI ROBOTIC ASSISTED PYELOPLASTY, lysis of adhesions;  Surgeon: Sherrilee Belvie CROME, MD;  Location: [facility];  Service: Urology;  Laterality: Right;  3 HRS   SKIN BIOPSY     SPACE OAR INSTILLATION N/A 07/28/2019   Procedure: SPACE OAR INSTILLATION;  Surgeon: Sherrilee Belvie CROME, MD;  Location: [facility];  Service: Urology;  Laterality: N/A;   URETEROSCOPY WITH HOLMIUM LASER  LITHOTRIPSY Right 09/24/2017   Procedure: CYSTOSCOPY, URETEROSCOPY WITH HOLMIUM LASER LITHOTRIPSY, STENT PLACEMENT;  Surgeon: Ottelin, Mark, MD;  Location[facility];  Service: Urology;  Laterality: Right;    Family History  Problem Relation Age of Onset   Heart disease Mother    Hypertension Mother    Stroke Mother    Colon cancer Mother 59   Breast cancer Mother 30   Heart disease Father        pacemaker   Aortic aneurysm Father    Hypertension Father    Lung cancer Father 55   Heart disease Sister    Atrial fibrillation Sister    Obesity Sister    Sleep apnea Sister    Heart attack Brother    Other Brother        muscle disease   Arthritis Brother    Stroke Brother    Atrial fibrillation Brother    Heart attack Brother    Atrial fibrillation Brother    Diabetes Brother    Atrial fibrillation Brother    Heart attack Brother    Hypertension Brother    Hyperlipidemia Brother    Heart attack Brother    Other Brother        heart valve  operation   Atrial fibrillation Brother    Heart attack Maternal Grandmother    Diabetes Maternal Grandmother    Cancer Maternal Grandfather 44       hodgin's lymphoma   Heart attack Paternal Grandmother    Anxiety disorder Paternal Grandmother    Pneumonia Paternal Grandfather    Liver cancer Nephew 2       great nephew; d. 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[employer]    Employer: [employer]  Tobacco Use   Smoking status: Former    Current packs/day: 0.00    Average packs/day: 1 pack/day for 6.0 years (6.0 ttl pk-yrs)    Types: Cigarettes    Start date: 01/11/1969    Quit date: 01/12/1969    Years since quitting: 54.7   Smokeless tobacco: Never   Tobacco comments:    1965-1971  Vaping Use   Vaping status: Never Used  Substance and Sexual Activity   Alcohol use: Not Currently    Comment: 2 whiskey drinks per day   Drug use: Never   Sexual activity: Yes  Other Topics Concern   Not on file  Social History Narrative   The patient is married for the second time, he has 3 sons.   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   2 alcoholic beverages most days.   1 caffeinated beverage daily   No drug use no current tobacco use he is a prior smoker   12/24/2016   Social Drivers of Health   Financial Resource Strain: Medium Risk (03/02/2023)   Overall Financial Resource Strain (CARDIA)    Difficulty of Paying Living Expenses: Somewhat hard  Food Insecurity: Low Risk  (06/10/2023)   Received from [facility]  Hunger Vital Sign    Within the past 12 months, you worried that your food would run out before you got  money to buy more: Never true    Within the past 12 months, the food you bought just didn't last and you didn't have money to get more. : Never true  Transportation Needs: No Transportation Needs (06/10/2023)   Received from [facility]   Transportation    In the past 12 months, has lack of reliable transportation kept you from medical appointments, meetings, work or from getting things needed for daily living? : No  Physical Activity: Inactive (04/05/2023)   Exercise Vital Sign    Days of Exercise per Week: 0 days    Minutes of Exercise per Session: 0 min  Stress: Stress Concern Present (04/05/2023)   [employer] of Occupational Health - Occupational Stress Questionnaire    Feeling of Stress : To some extent  Social Connections: Moderately Isolated (03/02/2023)   Social Connection and Isolation Panel    Frequency of Communication with Friends and Family: More than three times a week    Frequency of Social Gatherings with Friends and Family: Twice a week    Attends Religious Services: Never    Database administrator or Organizations: No    Attends Engineer, structural: Not on file    Marital Status: Married  Catering manager Violence: Not At Risk (03/02/2023)   Humiliation, Afraid, Rape, and Kick questionnaire    Fear of Current or Ex-Partner: No    Emotionally Abused: No    Physically Abused: No    Sexually Abused: No    Outpatient Medications Prior to Visit  Medication Sig Dispense Refill   acetaminophen  (TYLENOL ) 325 MG tablet Take 2 tablets (650 mg total) by mouth every 6 (six) hours as needed. 36 tablet 0   amLODipine  (NORVASC ) 2.5 MG tablet Take 1 tablet (2.5 mg total) by mouth daily. 90 tablet 1   apixaban  (ELIQUIS ) 5 MG TABS tablet Take 1 tablet (5 mg total) by mouth 2 (two) times daily. 180 tablet 3   ascorbic acid (VITAMIN C) 1000 MG tablet Take by mouth.     aspirin EC 81 MG tablet Take 81 mg by mouth daily.     atenolol  (TENORMIN ) 50 MG tablet TAKE 1 TABLET  BY MOUTH 2 TIMES A DAY 180 tablet 1   atorvastatin  (LIPITOR) 10 MG tablet TAKE 1 TABLET BY MOUTH EVERY MORNING 90 tablet 1   B Complex Vitamins (VITAMIN B-COMPLEX) TABS Take 1 tablet by mouth daily.     Cholecalciferol (VITAMIN D -1000 MAX ST) 25 MCG (1000 UT) tablet Take by mouth.     clotrimazole -betamethasone  (LOTRISONE ) cream Apply 1 Application topically 2 (two) times daily. 45 g 2   colchicine  0.6 MG tablet For gout flares, take 1 tab po once then repeat in 1 hour as needed if pain persists up to a total of 3 doses 15 tablet 0   fluticasone  (FLONASE ) 50 MCG/ACT nasal spray Place 2 sprays into both nostrils daily. 16 g 1   ketoconazole  (NIZORAL ) 2 % cream APPLY A THIN LAYER TOPICALLY DAILY 60 g 2   naproxen  (NAPROSYN ) 375 MG tablet Take 375 mg by mouth.     omeprazole  (PRILOSEC) 20 MG capsule Take 20 mg by mouth daily as needed (acid reflux).      tamsulosin  (FLOMAX ) 0.4 MG CAPS capsule Take 1 capsule (0.4 mg total) by mouth daily after supper. 90 capsule 0   tiZANidine  (ZANAFLEX )  2 MG tablet Take 1-2 mg by mouth.     triamcinolone  (KENALOG ) 0.025 % cream      allopurinol  (ZYLOPRIM ) 100 MG tablet TAKE A HALF TABLET BY MOUTH DAILY 45 tablet 1   FLUoxetine  (PROZAC ) 20 MG tablet Take 1 tablet (20 mg total) by mouth 2 (two) times daily. 180 tablet 1   LORazepam  (ATIVAN ) 1 MG tablet TAKE 1 TABLET BY MOUTH EVERY 8 HOURS AS NEEDED 90 tablet 1   QUEtiapine  (SEROQUEL ) 25 MG tablet Take 1 tablet (25 mg total) by mouth at bedtime. 90 tablet 1   gabapentin  (NEURONTIN ) 300 MG capsule Take 1 capsule (300 mg total) by mouth 2 (two) times daily as needed. (Patient taking differently: Take 300 mg by mouth 2 (two) times daily.) 60 capsule 3   HYDROcodone -acetaminophen  (NORCO/VICODIN) 5-325 MG tablet Take 1 tablet by mouth every 8 (eight) hours as needed for moderate pain (pain score 4-6). 15 tablet 0   lidocaine  (LIDODERM ) 5 % Place 1 patch onto the skin daily. Remove & Discard patch within 12 hours or as  directed by MD 30 patch 0   oxyCODONE  (OXY IR/ROXICODONE ) 5 MG immediate release tablet Take by mouth.     predniSONE  (DELTASONE ) 10 MG tablet TAKE 3 TABLETS PO QD FOR 3 DAYS THEN TAKE 2 TABLETS PO QD FOR 3 DAYS THEN TAKE 1 TABLET PO QD FOR 3 DAYS THEN TAKE 1/2 TAB PO QD FOR 3 DAYS 20 tablet 0   No facility-administered medications prior to visit.    Allergies  Allergen Reactions   Cefaclor Hives   Cephalosporins Hives   Gabapentin  Other (See Comments)   Penicillins Rash    Mild maculopapular rash Has patient had a PCN reaction causing immediate rash, facial/tongue/throat swelling, SOB or lightheadedness with hypotension: No Has patient had a PCN reaction causing severe rash involving mucus membranes or skin necrosis: No Has patient had a PCN reaction that required hospitalization: No Has patient had a PCN reaction occurring within the last 10 years: No If all of the above answers are NO, then may proceed with Cephalosporin use.     Review of Systems  Constitutional:  Positive for malaise/fatigue. Negative for chills and fever.  HENT:  Negative for congestion and hearing loss.   Eyes:  Negative for discharge.  Respiratory:  Negative for cough, sputum production and shortness of breath.   Cardiovascular:  Negative for chest pain, palpitations and leg swelling.  Gastrointestinal:  Positive for constipation. Negative for abdominal pain, blood in stool, diarrhea, heartburn, nausea and vomiting.  Genitourinary:  Negative for dysuria, frequency, hematuria and urgency.  Musculoskeletal:  Positive for back pain, joint pain and myalgias. Negative for falls.  Skin:  Negative for rash.  Neurological:  Negative for dizziness, sensory change, loss of consciousness, weakness and headaches.  Endo/Heme/Allergies:  Negative for environmental allergies. Does not bruise/bleed easily.  Psychiatric/Behavioral:  Positive for hallucinations and memory loss. Negative for depression and suicidal ideas.  The patient has insomnia. The patient is not nervous/anxious.        Objective:    Physical Exam Vitals reviewed.  Constitutional:      General: He is not in acute distress.    Appearance: Normal appearance. He is not ill-appearing or diaphoretic.  HENT:     Head: Normocephalic and atraumatic.     Right Ear: Tympanic membrane, ear canal and external ear normal. There is no impacted cerumen.     Left Ear: Tympanic membrane, ear canal and external ear normal.  There is no impacted cerumen.     Nose: Nose normal. No rhinorrhea.     Mouth/Throat:     Pharynx: Oropharynx is clear.  Eyes:     General: No scleral icterus.    Extraocular Movements: Extraocular movements intact.     Conjunctiva/sclera: Conjunctivae normal.     Pupils: Pupils are equal, round, and reactive to light.  Neck:     Thyroid : No thyroid  mass or thyroid  tenderness.  Cardiovascular:     Rate and Rhythm: Normal rate and regular rhythm.     Pulses: Normal pulses.     Heart sounds: Normal heart sounds. No murmur heard. Pulmonary:     Effort: Pulmonary effort is normal.     Breath sounds: Normal breath sounds. No wheezing.  Abdominal:     General: Bowel sounds are normal.     Palpations: Abdomen is soft. There is no mass.     Tenderness: There is no guarding.  Musculoskeletal:        General: No swelling. Normal range of motion.     Cervical back: Normal range of motion and neck supple. No rigidity.     Right lower leg: No edema.     Left lower leg: No edema.     Comments: Right foot bandaged no surrounding fluctuance  Lymphadenopathy:     Cervical: No cervical adenopathy.  Skin:    General: Skin is warm and dry.     Findings: No rash.  Neurological:     General: No focal deficit present.     Mental Status: He is alert and oriented to person, place, and time.     Cranial Nerves: No cranial nerve deficit.     Deep Tendon Reflexes: Reflexes normal.  Psychiatric:        Mood and Affect: Mood normal.         Behavior: Behavior normal.     BP 128/68 (BP Location: Right Arm, Patient Position: Sitting)   Pulse 60   Resp 16   Ht 5' 11 (1.803 m)   SpO2 95%   BMI 28.31 kg/m  Wt Readings from Last 3 Encounters:  08/18/23 206 lb (93.4 kg)  07/28/23 203 lb (92.1 kg)  07/22/23 203 lb (92.1 kg)    Diabetic Foot Exam - Simple   No data filed    Lab Results  Component Value Date   WBC 3.8 (L) 08/02/2023   HGB 10.7 (L) 08/02/2023   HCT 32.0 (L) 08/02/2023   PLT 179.0 08/02/2023   GLUCOSE 94 08/02/2023   CHOL 131 08/02/2023   TRIG 83.0 08/02/2023   HDL 43.40 08/02/2023   LDLDIRECT 168.5 03/29/2006   LDLCALC 71 08/02/2023   ALT 15 08/02/2023   AST 19 08/02/2023   NA 139 08/02/2023   K 4.2 08/02/2023   CL 104 08/02/2023   CREATININE 1.46 08/02/2023   BUN 21 08/02/2023   CO2 29 08/02/2023   TSH 2.42 08/02/2023   PSA 4.01 (H) 07/16/2021   INR 1.0 07/25/2019   HGBA1C 5.4 08/02/2023    Lab Results  Component Value Date   TSH 2.42 08/02/2023   Lab Results  Component Value Date   WBC 3.8 (L) 08/02/2023   HGB 10.7 (L) 08/02/2023   HCT 32.0 (L) 08/02/2023   MCV 96.7 08/02/2023   PLT 179.0 08/02/2023   Lab Results  Component Value Date   NA 139 08/02/2023   K 4.2 08/02/2023   CO2 29 08/02/2023   GLUCOSE 94 08/02/2023  BUN 21 08/02/2023   CREATININE 1.46 08/02/2023   BILITOT 0.7 08/02/2023   ALKPHOS 69 08/02/2023   AST 19 08/02/2023   ALT 15 08/02/2023   PROT 5.8 (L) 08/02/2023   ALBUMIN  3.8 08/02/2023   CALCIUM  10.2 08/02/2023   ANIONGAP 11 03/31/2023   EGFR 37 (L) 12/18/2021   GFR 45.87 (L) 08/02/2023   Lab Results  Component Value Date   CHOL 131 08/02/2023   Lab Results  Component Value Date   HDL 43.40 08/02/2023   Lab Results  Component Value Date   LDLCALC 71 08/02/2023   Lab Results  Component Value Date   TRIG 83.0 08/02/2023   Lab Results  Component Value Date   CHOLHDL 3 08/02/2023   Lab Results  Component Value Date   HGBA1C 5.4  08/02/2023       Assessment & Plan:  Preventative health care Assessment & Plan: Patient encouraged to maintain heart healthy diet, regular exercise, adequate sleep. Consider daily probiotics. Take medications as prescribed. Labs ordered and reviewed. Colonoscopy 2024 has aged out. Given and reviewed copy of ACP documents from [facility] Secretary of State and encouraged to complete and return    Chronic renal impairment, unspecified CKD stage Assessment & Plan: Hydrate and monitor. Sees nephrology.   Orders: -     AMB Referral [facility] Care Management  Essential hypertension, benign Assessment & Plan: Well controlled, no changes to meds. Encouraged heart healthy diet such as the DASH diet and exercise as tolerated.   Orders: -     Comprehensive metabolic panel with GFR -     CBC with Differential/Platelet -     AMB Referral [facility] Care Management  Chronic gout without tophus, unspecified cause, unspecified site Assessment & Plan: Hydrate and monitor    Hyperglycemia Assessment & Plan: hgba1c acceptable, minimize simple carbs. Increase exercise as tolerated.  Orders: -     Hemoglobin A1c -     TSH  Hyperlipidemia, mixed Assessment & Plan: Encourage heart healthy diet such as MIND or DASH diet, increase exercise, avoid trans fats, simple carbohydrates and processed foods, consider a krill or fish or flaxseed oil cap daily.   Orders: -     Lipid panel  Muscle cramp Assessment & Plan: Hydrate and monitor    Rash -     Ambulatory referral to Dermatology  Dementia with other behavioral disturbance, unspecified dementia severity, unspecified dementia type (HCC) -     AMB Referral [facility] Care Management  Low back pain with radiation Assessment & Plan: Resulting in unsteady gait and increased fall risk. Patient would benefit from a standard walker to improve his mobility     Assessment and Plan Assessment & Plan Chronic lower back and leg pain Pain exacerbated by recent  lifting and twisting, persistent since move three months ago. No orthopedic follow-up. - Encouraged minimal walking, proper foot support. - Recommended foot elevation above heart 10-15 minutes, three times daily. - Advised hydration with 80 ounces of water  daily. - Suggested protein intake every 3-4 hours, multivitamin with minerals.  Chronic rash Rash persistent for years, resistant to corticosteroids and has responded to antifungals at times but then recur Possible lichen reaction or radiation-related dermatitis.  - Refer to dermatology for evaluation and treatment. - Advise use of antibacterial soap like Cetaphil, witch hazel astringent. - Recommend keeping area dry using hair dryer after cleansing.  Alzheimer's disease Diagnosed in April. Experiencing sleep disturbances, restlessness, occasional hallucinations. On lorazepam  and quetiapine  25 mg for sleep. Neurologist  involved. - Suggest two 25 mg tablets of quetiapine  for sleep per neurologist. - Recommend discussing sleep disturbances with neurologist in August. - Consider CBTI Coach app for sleep hygiene. - Explore magnesium  glycinate and helite for sleep improvement. Referred to case management to discuss future placement options.   General Health Maintenance Reviewed immunizations and preventative health measures. Discussed need for updated vaccinations. - Recommend tetanus booster, last in 2014. - Advise shingles series with Shingrix. - Suggest Prevnar 20 for pneumococcal protection.     Harlene Horton, MD

## 2023-08-03 ENCOUNTER — Ambulatory Visit (INDEPENDENT_AMBULATORY_CARE_PROVIDER_SITE_OTHER)

## 2023-08-03 ENCOUNTER — Ambulatory Visit: Payer: Self-pay | Admitting: Family Medicine

## 2023-08-03 DIAGNOSIS — I4891 Unspecified atrial fibrillation: Secondary | ICD-10-CM | POA: Diagnosis not present

## 2023-08-03 DIAGNOSIS — Z945 Skin transplant status: Secondary | ICD-10-CM | POA: Diagnosis not present

## 2023-08-03 DIAGNOSIS — E785 Hyperlipidemia, unspecified: Secondary | ICD-10-CM | POA: Diagnosis not present

## 2023-08-03 DIAGNOSIS — Z556 Problems related to health literacy: Secondary | ICD-10-CM | POA: Diagnosis not present

## 2023-08-03 DIAGNOSIS — R1310 Dysphagia, unspecified: Secondary | ICD-10-CM | POA: Diagnosis not present

## 2023-08-03 DIAGNOSIS — I129 Hypertensive chronic kidney disease with stage 1 through stage 4 chronic kidney disease, or unspecified chronic kidney disease: Secondary | ICD-10-CM | POA: Diagnosis not present

## 2023-08-03 DIAGNOSIS — D649 Anemia, unspecified: Secondary | ICD-10-CM

## 2023-08-03 DIAGNOSIS — M1990 Unspecified osteoarthritis, unspecified site: Secondary | ICD-10-CM | POA: Diagnosis not present

## 2023-08-03 DIAGNOSIS — Z7901 Long term (current) use of anticoagulants: Secondary | ICD-10-CM | POA: Diagnosis not present

## 2023-08-03 DIAGNOSIS — Z604 Social exclusion and rejection: Secondary | ICD-10-CM | POA: Diagnosis not present

## 2023-08-03 DIAGNOSIS — N183 Chronic kidney disease, stage 3 unspecified: Secondary | ICD-10-CM | POA: Diagnosis not present

## 2023-08-03 DIAGNOSIS — Z483 Aftercare following surgery for neoplasm: Secondary | ICD-10-CM | POA: Diagnosis not present

## 2023-08-03 DIAGNOSIS — K5900 Constipation, unspecified: Secondary | ICD-10-CM | POA: Diagnosis not present

## 2023-08-03 DIAGNOSIS — C4371 Malignant melanoma of right lower limb, including hip: Secondary | ICD-10-CM | POA: Diagnosis not present

## 2023-08-03 DIAGNOSIS — Z9181 History of falling: Secondary | ICD-10-CM | POA: Diagnosis not present

## 2023-08-03 DIAGNOSIS — Z79899 Other long term (current) drug therapy: Secondary | ICD-10-CM | POA: Diagnosis not present

## 2023-08-03 DIAGNOSIS — Z48298 Encounter for aftercare following other organ transplant: Secondary | ICD-10-CM | POA: Diagnosis not present

## 2023-08-03 LAB — VITAMIN B12: Vitamin B-12: 441 pg/mL (ref 211–911)

## 2023-08-05 DIAGNOSIS — Z483 Aftercare following surgery for neoplasm: Secondary | ICD-10-CM | POA: Diagnosis not present

## 2023-08-05 DIAGNOSIS — N183 Chronic kidney disease, stage 3 unspecified: Secondary | ICD-10-CM | POA: Diagnosis not present

## 2023-08-05 DIAGNOSIS — K5900 Constipation, unspecified: Secondary | ICD-10-CM | POA: Diagnosis not present

## 2023-08-05 DIAGNOSIS — Z604 Social exclusion and rejection: Secondary | ICD-10-CM | POA: Diagnosis not present

## 2023-08-05 DIAGNOSIS — Z7901 Long term (current) use of anticoagulants: Secondary | ICD-10-CM | POA: Diagnosis not present

## 2023-08-05 DIAGNOSIS — Z48298 Encounter for aftercare following other organ transplant: Secondary | ICD-10-CM | POA: Diagnosis not present

## 2023-08-05 DIAGNOSIS — Z9181 History of falling: Secondary | ICD-10-CM | POA: Diagnosis not present

## 2023-08-05 DIAGNOSIS — E785 Hyperlipidemia, unspecified: Secondary | ICD-10-CM | POA: Diagnosis not present

## 2023-08-05 DIAGNOSIS — R1310 Dysphagia, unspecified: Secondary | ICD-10-CM | POA: Diagnosis not present

## 2023-08-05 DIAGNOSIS — I4891 Unspecified atrial fibrillation: Secondary | ICD-10-CM | POA: Diagnosis not present

## 2023-08-05 DIAGNOSIS — I129 Hypertensive chronic kidney disease with stage 1 through stage 4 chronic kidney disease, or unspecified chronic kidney disease: Secondary | ICD-10-CM | POA: Diagnosis not present

## 2023-08-05 DIAGNOSIS — Z556 Problems related to health literacy: Secondary | ICD-10-CM | POA: Diagnosis not present

## 2023-08-05 DIAGNOSIS — Z79899 Other long term (current) drug therapy: Secondary | ICD-10-CM | POA: Diagnosis not present

## 2023-08-05 DIAGNOSIS — Z945 Skin transplant status: Secondary | ICD-10-CM | POA: Diagnosis not present

## 2023-08-05 DIAGNOSIS — C4371 Malignant melanoma of right lower limb, including hip: Secondary | ICD-10-CM | POA: Diagnosis not present

## 2023-08-05 DIAGNOSIS — M1990 Unspecified osteoarthritis, unspecified site: Secondary | ICD-10-CM | POA: Diagnosis not present

## 2023-08-05 NOTE — Progress Notes (Signed)
Patient reviewed via MyChart.

## 2023-08-10 DIAGNOSIS — Z48298 Encounter for aftercare following other organ transplant: Secondary | ICD-10-CM | POA: Diagnosis not present

## 2023-08-10 DIAGNOSIS — Z483 Aftercare following surgery for neoplasm: Secondary | ICD-10-CM | POA: Diagnosis not present

## 2023-08-10 DIAGNOSIS — I4891 Unspecified atrial fibrillation: Secondary | ICD-10-CM | POA: Diagnosis not present

## 2023-08-10 DIAGNOSIS — Z604 Social exclusion and rejection: Secondary | ICD-10-CM | POA: Diagnosis not present

## 2023-08-10 DIAGNOSIS — E785 Hyperlipidemia, unspecified: Secondary | ICD-10-CM | POA: Diagnosis not present

## 2023-08-10 DIAGNOSIS — Z79899 Other long term (current) drug therapy: Secondary | ICD-10-CM | POA: Diagnosis not present

## 2023-08-10 DIAGNOSIS — Z945 Skin transplant status: Secondary | ICD-10-CM | POA: Diagnosis not present

## 2023-08-10 DIAGNOSIS — C4371 Malignant melanoma of right lower limb, including hip: Secondary | ICD-10-CM | POA: Diagnosis not present

## 2023-08-10 DIAGNOSIS — Z556 Problems related to health literacy: Secondary | ICD-10-CM | POA: Diagnosis not present

## 2023-08-10 DIAGNOSIS — R1310 Dysphagia, unspecified: Secondary | ICD-10-CM | POA: Diagnosis not present

## 2023-08-10 DIAGNOSIS — M1990 Unspecified osteoarthritis, unspecified site: Secondary | ICD-10-CM | POA: Diagnosis not present

## 2023-08-10 DIAGNOSIS — N183 Chronic kidney disease, stage 3 unspecified: Secondary | ICD-10-CM | POA: Diagnosis not present

## 2023-08-10 DIAGNOSIS — Z9181 History of falling: Secondary | ICD-10-CM | POA: Diagnosis not present

## 2023-08-10 DIAGNOSIS — Z7901 Long term (current) use of anticoagulants: Secondary | ICD-10-CM | POA: Diagnosis not present

## 2023-08-10 DIAGNOSIS — I129 Hypertensive chronic kidney disease with stage 1 through stage 4 chronic kidney disease, or unspecified chronic kidney disease: Secondary | ICD-10-CM | POA: Diagnosis not present

## 2023-08-10 DIAGNOSIS — K5900 Constipation, unspecified: Secondary | ICD-10-CM | POA: Diagnosis not present

## 2023-08-12 ENCOUNTER — Other Ambulatory Visit: Payer: Self-pay | Admitting: Family Medicine

## 2023-08-12 DIAGNOSIS — Z483 Aftercare following surgery for neoplasm: Secondary | ICD-10-CM | POA: Diagnosis not present

## 2023-08-12 DIAGNOSIS — R32 Unspecified urinary incontinence: Secondary | ICD-10-CM

## 2023-08-12 DIAGNOSIS — F411 Generalized anxiety disorder: Secondary | ICD-10-CM

## 2023-08-12 DIAGNOSIS — R1310 Dysphagia, unspecified: Secondary | ICD-10-CM | POA: Diagnosis not present

## 2023-08-12 DIAGNOSIS — I4891 Unspecified atrial fibrillation: Secondary | ICD-10-CM | POA: Diagnosis not present

## 2023-08-12 DIAGNOSIS — N183 Chronic kidney disease, stage 3 unspecified: Secondary | ICD-10-CM | POA: Diagnosis not present

## 2023-08-12 DIAGNOSIS — C4371 Malignant melanoma of right lower limb, including hip: Secondary | ICD-10-CM | POA: Diagnosis not present

## 2023-08-12 DIAGNOSIS — F03B3 Unspecified dementia, moderate, with mood disturbance: Secondary | ICD-10-CM | POA: Diagnosis not present

## 2023-08-12 DIAGNOSIS — K5900 Constipation, unspecified: Secondary | ICD-10-CM | POA: Diagnosis not present

## 2023-08-12 DIAGNOSIS — Z945 Skin transplant status: Secondary | ICD-10-CM | POA: Diagnosis not present

## 2023-08-12 DIAGNOSIS — Z9181 History of falling: Secondary | ICD-10-CM | POA: Diagnosis not present

## 2023-08-12 DIAGNOSIS — I129 Hypertensive chronic kidney disease with stage 1 through stage 4 chronic kidney disease, or unspecified chronic kidney disease: Secondary | ICD-10-CM | POA: Diagnosis not present

## 2023-08-12 DIAGNOSIS — Z604 Social exclusion and rejection: Secondary | ICD-10-CM | POA: Diagnosis not present

## 2023-08-12 DIAGNOSIS — E785 Hyperlipidemia, unspecified: Secondary | ICD-10-CM | POA: Diagnosis not present

## 2023-08-12 DIAGNOSIS — F32A Depression, unspecified: Secondary | ICD-10-CM

## 2023-08-12 DIAGNOSIS — Z556 Problems related to health literacy: Secondary | ICD-10-CM | POA: Diagnosis not present

## 2023-08-12 DIAGNOSIS — Z7901 Long term (current) use of anticoagulants: Secondary | ICD-10-CM | POA: Diagnosis not present

## 2023-08-12 DIAGNOSIS — Z48298 Encounter for aftercare following other organ transplant: Secondary | ICD-10-CM | POA: Diagnosis not present

## 2023-08-12 DIAGNOSIS — M1990 Unspecified osteoarthritis, unspecified site: Secondary | ICD-10-CM | POA: Diagnosis not present

## 2023-08-12 DIAGNOSIS — F03B4 Unspecified dementia, moderate, with anxiety: Secondary | ICD-10-CM

## 2023-08-12 DIAGNOSIS — Z79899 Other long term (current) drug therapy: Secondary | ICD-10-CM | POA: Diagnosis not present

## 2023-08-17 DIAGNOSIS — Z945 Skin transplant status: Secondary | ICD-10-CM | POA: Diagnosis not present

## 2023-08-17 DIAGNOSIS — I129 Hypertensive chronic kidney disease with stage 1 through stage 4 chronic kidney disease, or unspecified chronic kidney disease: Secondary | ICD-10-CM | POA: Diagnosis not present

## 2023-08-17 DIAGNOSIS — M1990 Unspecified osteoarthritis, unspecified site: Secondary | ICD-10-CM | POA: Diagnosis not present

## 2023-08-17 DIAGNOSIS — E785 Hyperlipidemia, unspecified: Secondary | ICD-10-CM | POA: Diagnosis not present

## 2023-08-17 DIAGNOSIS — Z9181 History of falling: Secondary | ICD-10-CM | POA: Diagnosis not present

## 2023-08-17 DIAGNOSIS — Z604 Social exclusion and rejection: Secondary | ICD-10-CM | POA: Diagnosis not present

## 2023-08-17 DIAGNOSIS — Z79899 Other long term (current) drug therapy: Secondary | ICD-10-CM | POA: Diagnosis not present

## 2023-08-17 DIAGNOSIS — Z556 Problems related to health literacy: Secondary | ICD-10-CM | POA: Diagnosis not present

## 2023-08-17 DIAGNOSIS — Z48298 Encounter for aftercare following other organ transplant: Secondary | ICD-10-CM | POA: Diagnosis not present

## 2023-08-17 DIAGNOSIS — N183 Chronic kidney disease, stage 3 unspecified: Secondary | ICD-10-CM | POA: Diagnosis not present

## 2023-08-17 DIAGNOSIS — I4891 Unspecified atrial fibrillation: Secondary | ICD-10-CM | POA: Diagnosis not present

## 2023-08-17 DIAGNOSIS — K5900 Constipation, unspecified: Secondary | ICD-10-CM | POA: Diagnosis not present

## 2023-08-17 DIAGNOSIS — R1310 Dysphagia, unspecified: Secondary | ICD-10-CM | POA: Diagnosis not present

## 2023-08-17 DIAGNOSIS — Z7901 Long term (current) use of anticoagulants: Secondary | ICD-10-CM | POA: Diagnosis not present

## 2023-08-17 DIAGNOSIS — Z483 Aftercare following surgery for neoplasm: Secondary | ICD-10-CM | POA: Diagnosis not present

## 2023-08-17 DIAGNOSIS — C4371 Malignant melanoma of right lower limb, including hip: Secondary | ICD-10-CM | POA: Diagnosis not present

## 2023-08-18 ENCOUNTER — Encounter: Payer: Self-pay | Admitting: Neurology

## 2023-08-18 ENCOUNTER — Ambulatory Visit: Admitting: Neurology

## 2023-08-18 DIAGNOSIS — F03B3 Unspecified dementia, moderate, with mood disturbance: Secondary | ICD-10-CM | POA: Diagnosis not present

## 2023-08-18 DIAGNOSIS — R269 Unspecified abnormalities of gait and mobility: Secondary | ICD-10-CM

## 2023-08-18 DIAGNOSIS — R2689 Other abnormalities of gait and mobility: Secondary | ICD-10-CM | POA: Diagnosis not present

## 2023-08-18 DIAGNOSIS — R4189 Other symptoms and signs involving cognitive functions and awareness: Secondary | ICD-10-CM

## 2023-08-18 MED ORDER — GABAPENTIN 100 MG PO CAPS: 100.0000 mg | ORAL_CAPSULE | Freq: Three times a day (TID) | ORAL | 11 refills | Status: DC | PRN

## 2023-08-18 MED ORDER — QUETIAPINE FUMARATE 25 MG PO TABS: 50.0000 mg | ORAL_TABLET | Freq: Every evening | ORAL | 11 refills | Status: AC | PRN

## 2023-08-18 NOTE — Progress Notes (Signed)
 Chief Complaint  Patient presents with   EMG/NCV    Rm 4, with wife, nerve conduction   ASSESSMENT AND PLAN  David [name] is a 78 y.o. male   Dementia with agitation  MoCA examination 22/30  MRI of brain showed generalized atrophy small vessel disease there was no acute abnormality  Laboratory evaluation showed low normal B12 217, otherwise no treatable cause, advised B12 supplement,  Most suggestive of central nervous system degenerative disorder, such as Alzheimer's disease   on Seroquel  25 mg at night  Wife would like Alzheimer specific profile  Worsening gait abnormality, left lower extremity weakness  EMG nerve conduction study August 18, 2023 confirmed significant active neuropathic changes involving left L4-5 myotomes, in the setting of moderate axonal sensorimotor polyneuropathy, this raise the possibility of left lumbosacral radiculopathy versus plexopathy, findings are not in line with the MRI lumbar spine, I would suggest MRI of the pelvic with without contrast to rule out plexus compression    Return To Clinic With NP In 6 Months   DIAGNOSTIC DATA (LABS, IMAGING, TESTING) - I reviewed patient records, labs, notes, testing and imaging myself where available.   MEDICAL HISTORY:  David [name], is a 78 year old male seen in request by his primary care doctor Domenica Harlene LABOR, for evaluation of memory loss, he is accompanied by his wife and son at today's visit November 30, 2022  History is obtained from the patient and review of electronic medical records. I personally reviewed pertinent available imaging films in PACS.   PMHx of  HLD HTN Gout History of A fib-on Eliquis  History of kidney stone Prostate cancer,  s/p radiation therapy,   Patient is retired Airline pilot at age 21, used to be very physically active, but in recent couple years, he has to deal with his prostate cancer, radiation, which has caused a lot of irritation in his pelvic region, now become  more sedentary, enjoying watch TV, reading books, still walking his neighborhood,  Over the past couple years, he was noted to have gradual onset of memory loss, his son gave me an example of him forgetting how to use his cell phone, go to the operating TV remote control, sometimes worse than the others,  he denies hallucinations, 2023 was the first year for him to let other people to work on his personal income tax, denies gait abnormality, no family history of dementia  PET scan in April 2024,  1. Low-level, relatively diffuse tracer affinity throughout the prostate is decreased since 11/11/2020. Residual or recurrent local disease cannot be excluded. 2. No evidence of tracer avid nodal, osseous, or distant metastasis. 3. Ascending aortic dilatation at 4.3 cm. Please see 11/17/2021 dedicated CTA. 4. Incidental findings, including: Coronary artery atherosclerosis. Aortic Atherosclerosis (ICD10-I70.0). Left nephrolithiasis. Hepatic steatosis.  UPDATE April 22 2023: He is accompanied by his wife and son at today's visit, for increased confusion, they are in the process of moving to an apartment, has boxes around, he has difficulty sleeping, wanders at home,  he has trouble walking,  left knee pain, see orthopedics, was put on gabapentin  300mg  bid end of March, did help his low back and left leg pain, but his confusion, memory loss is getting worse, now he is off gabapentin ,   MRI of the brain December 2024 showed mild generalized atrophy small vessel disease  UPDATE August 6th 2025,  He is accompanied by his wife for electrodiagnostic study to evaluate his worsening left lower extremity weakness, gait abnormality,  EMG nerve conduction study August 18, 2023 showed evidence of active left L4-5 S1 radiculopathy, in the background of moderate axonal sensorimotor polyneuropathy, differentiation diagnosis also including left lumbosacral plexopathy. There was also evidence of mild left lower  cervical radiculopathy  MRI lumbar August 18, 2023 reviewed, multilevel degenerative changes, L4-5 retrolisthesis, but there was only mild narrowing of lateral recess, L5-S1, right more than left foraminal stenosis  He has increased confusion, sleeping a lot during the day, wake up a lot during the night, get irritated easily, prolonged confusion,  He recently had right foot melanoma surgery, required prolonged recovery  PHYSICAL EXAM:   Vitals:   08/18/23 0919  BP: 114/78  Weight: 206 lb (93.4 kg)  Height: 5' 11 (1.803 m)   Body mass index is 28.73 kg/m.  PHYSICAL EXAMNIATION:  Gen: NAD, conversant, well nourised, well groomed                     Cardiovascular: Regular rate rhythm, no peripheral edema, warm, nontender. Eyes: Conjunctivae clear without exudates or hemorrhage Neck: Supple, no carotid bruits. Pulmonary: Clear to auscultation bilaterally   NEUROLOGICAL EXAM:  MENTAL STATUS: Speech/cognition: Awake, alert, oriented to history taking and casual conversation    04/22/2023    2:19 PM 11/30/2022    3:47 PM  Montreal Cognitive Assessment   Visuospatial/ Executive (0/5) 3 5  Naming (0/3) 3 2  Attention: Read list of digits (0/2) 2 2  Attention: Read list of letters (0/1) 1 1  Attention: Serial 7 subtraction starting at 100 (0/3) 3 3  Language: Repeat phrase (0/2) 2 2  Language : Fluency (0/1) 1 1  Abstraction (0/2) 2 2  Delayed Recall (0/5) 0 0  Orientation (0/6) 5 4  Total 22 22    CRANIAL NERVES: CN II: Visual fields are full to confrontation. Pupils are round equal and briskly reactive to light. CN III, IV, VI: extraocular movement are normal. No ptosis. CN V: Facial sensation is intact to light touch CN VII: Face is symmetric with normal eye closure  CN VIII: Hearing is normal to causal conversation. CN IX, X: Phonation is normal. CN XI: Head turning and shoulder shrug are intact  MOTOR: Upper extremity motor strength is normal, moderate left hip  flexion, abduction weakness, mild knee flexion, extension weakness, profound left distal leg weakness  REFLEXES: Reflexes are 1 and symmetric at the biceps, triceps, absent at knees, and ankles. Plantar responses are flexor.  SENSORY: Length-dependent sensory changes  COORDINATION: There is no trunk or limb dysmetria noted.  GAIT/STANCE: Unsteady  REVIEW OF SYSTEMS:  Full 14 system review of systems performed and notable only for as above All other review of systems were negative.   ALLERGIES: Allergies  Allergen Reactions   Cefaclor Hives   Cephalosporins Hives   Gabapentin  Other (See Comments)   Penicillins Rash    Mild maculopapular rash Has patient had a PCN reaction causing immediate rash, facial/tongue/throat swelling, SOB or lightheadedness with hypotension: No Has patient had a PCN reaction causing severe rash involving mucus membranes or skin necrosis: No Has patient had a PCN reaction that required hospitalization: No Has patient had a PCN reaction occurring within the last 10 years: No If all of the above answers are NO, then may proceed with Cephalosporin use.     HOME MEDICATIONS: Current Outpatient Medications  Medication Sig Dispense Refill   acetaminophen  (TYLENOL ) 325 MG tablet Take 2 tablets (650 mg total) by mouth every 6 (six) hours  as needed. 36 tablet 0   allopurinol  (ZYLOPRIM ) 100 MG tablet TAKE A HALF TABLET BY MOUTH DAILY 45 tablet 1   amLODipine  (NORVASC ) 2.5 MG tablet Take 1 tablet (2.5 mg total) by mouth daily. 90 tablet 1   apixaban  (ELIQUIS ) 5 MG TABS tablet Take 1 tablet (5 mg total) by mouth 2 (two) times daily. 180 tablet 3   ascorbic acid (VITAMIN C) 1000 MG tablet Take by mouth.     aspirin EC 81 MG tablet Take 81 mg by mouth daily.     atenolol  (TENORMIN ) 50 MG tablet TAKE 1 TABLET BY MOUTH 2 TIMES A DAY 180 tablet 1   atorvastatin  (LIPITOR) 10 MG tablet TAKE 1 TABLET BY MOUTH EVERY MORNING 90 tablet 1   B Complex Vitamins (VITAMIN  B-COMPLEX) TABS Take 1 tablet by mouth daily.     Cholecalciferol (VITAMIN D -1000 MAX ST) 25 MCG (1000 UT) tablet Take by mouth.     clotrimazole -betamethasone  (LOTRISONE ) cream Apply 1 Application topically 2 (two) times daily. 45 g 2   colchicine  0.6 MG tablet For gout flares, take 1 tab po once then repeat in 1 hour as needed if pain persists up to a total of 3 doses 15 tablet 0   FLUoxetine  (PROZAC ) 20 MG tablet TAKE 1 TABLET BY MOUTH 2 TIMES A DAY 180 tablet 1   fluticasone  (FLONASE ) 50 MCG/ACT nasal spray Place 2 sprays into both nostrils daily. 16 g 1   ketoconazole  (NIZORAL ) 2 % cream APPLY A THIN LAYER TOPICALLY DAILY 60 g 2   LORazepam  (ATIVAN ) 1 MG tablet TAKE 1 TABLET BY MOUTH EVERY 8 HOURS AS NEEDED 90 tablet 1   naproxen  (NAPROSYN ) 375 MG tablet Take 375 mg by mouth.     omeprazole  (PRILOSEC) 20 MG capsule Take 20 mg by mouth daily as needed (acid reflux).      QUEtiapine  (SEROQUEL ) 25 MG tablet Take 1 tablet (25 mg total) by mouth at bedtime. 90 tablet 1   tamsulosin  (FLOMAX ) 0.4 MG CAPS capsule Take 1 capsule (0.4 mg total) by mouth daily after supper. 90 capsule 0   tiZANidine  (ZANAFLEX ) 2 MG tablet Take 1-2 mg by mouth.     triamcinolone  (KENALOG ) 0.025 % cream      No current facility-administered medications for this visit.    PAST MEDICAL HISTORY: Past Medical History:  Diagnosis Date   Adenoma of right adrenal gland 07/11/2002   2.4cm , noted on CT ABD   Anxiety    Arthritis of both knees 03/26/2016   Astigmatism    Back pain    BCC (basal cell carcinoma of skin)    under right eye and right ear   Blood transfusion without reported diagnosis    Cataract 09/29/2016   Depression    Diverticulitis 2009   Diverticulosis    Family history of breast cancer    Family history of colon cancer    Fatty liver    GERD (gastroesophageal reflux disease)    Gout    Hearing loss of both ears 03/26/2016   History of atrial fibrillation    x 2 none since 2020   History  of chronic prostatitis    started at age 64   History of kidney stones    Hx of adenomatous colonic polyps 02/16/2022   3 diminutive - no recall   Hyperglycemia    Hyperlipidemia    Hypertension    Incomplete right bundle branch block (RBBB)    Internal hemorrhoids  Kidney lesion 06/07/2015   Right midportion, 1.1x1.1 cm hyperechoic, noted on US  ABD   LAFB (left anterior fascicular block)    Lipoma of axilla 09/2016   Liver lesion, right lobe 06/07/2015   2.5x2.4x2.4 cm hypoechoic lesion posterior aspect, noted on US  ABD   Low back pain 03/26/2016   LVH (left ventricular hypertrophy) 08/06/2017   Moderate, Noted on ECHO   Medicare annual wellness visit, subsequent 03/12/2014   OA (osteoarthritis)    Back, Hands, Neck   Obesity 11/25/2007   Qualifier: Diagnosis of  By: Christi MD, Glendia HERO    Other malaise and fatigue 03/13/2013   Premature ventricular complex    Prostate cancer (HCC) dx 2021   Renal insufficiency    Kidney removed right   Scoliosis    Upper thoracic and lumbar   Sinus bradycardia 08/06/2017   Noted on EKG   Vitamin D  deficiency    resolved    PAST SURGICAL HISTORY: Past Surgical History:  Procedure Laterality Date   CARDIOVERSION  07/31/2017   CATARACT EXTRACTION, BILATERAL     COLON SURGERY  2009   segmental sigmoid resection   COLONOSCOPY     COLONOSCOPY WITH PROPOFOL      CYSTOSCOPY N/A 07/28/2019   Procedure: CYSTOSCOPY FLEXIBLE;  Surgeon: Sherrilee Belvie CROME, MD;  Location: [facility];  Service: Urology;  Laterality: N/A;   CYSTOSCOPY W/ RETROGRADES Right 06/07/2018   Procedure: CYSTOSCOPY WITH RETROGRADE PYELOGRAM, uphrostogram;  Surgeon: Sherrilee Belvie CROME, MD;  Location: [facility];  Service: Urology;  Laterality: Right;   CYSTOSCOPY WITH URETEROSCOPY AND STENT PLACEMENT Right 02/28/2018   Procedure: CYSTOSCOPY WITH URETEROSCOPY ONNIE;  Surgeon: Ottelin, Mark, MD;  Location: [facility];  Service: Urology;   Laterality: Right;   CYSTOSCOPY/URETEROSCOPY/HOLMIUM LASER/STENT PLACEMENT Right 11/29/2017   Procedure: CYSTOSCOPY/RETROGRADE/URETEROSCOPY/HOLMIUM LASER/STENT PLACEMENT;  Surgeon: Ottelin, Mark, MD;  Location: [facility];  Service: Urology;  Laterality: Right;   EYE SURGERY Bilateral 01/12/2017   cataract removal   history of blood tranfusion  age 26   HOLMIUM LASER APPLICATION Right 09/24/2017   Procedure: HOLMIUM LASER APPLICATION;  Surgeon: Ottelin, Mark, MD;  Location: [facility];  Service: Urology;  Laterality: Right;   IR BALLOON DILATION URETERAL STRICTURE RIGHT  03/14/2018   IR NEPHRO TUBE REMOV/FL  03/17/2018   IR NEPHROSTOGRAM RIGHT THRU EXISTING ACCESS  03/17/2018   IR NEPHROSTOMY EXCHANGE RIGHT  03/14/2018   IR NEPHROSTOMY EXCHANGE RIGHT  06/21/2018   IR NEPHROSTOMY PLACEMENT RIGHT  03/02/2018   IR NEPHROSTOMY PLACEMENT RIGHT  04/20/2018   IR URETERAL STENT PLACEMENT EXISTING ACCESS RIGHT  03/14/2018   NOSE SURGERY     Submucous resection age 41   NUCLEAR STRESS TEST  06/03/2009   RADIOACTIVE SEED IMPLANT N/A 07/28/2019   Procedure: RADIOACTIVE SEED IMPLANT/BRACHYTHERAPY IMPLANT;  Surgeon: Sherrilee Belvie CROME, MD;  Location: [facility];  Service: Urology;  Laterality: N/A;  90 MINS   ROBOT ASSISTED LAPAROSCOPIC NEPHRECTOMY Right 08/04/2018   Procedure: XI ROBOTIC ASSISTED LAPAROSCOPIC NEPHRECTOMY;  Surgeon: Sherrilee Belvie CROME, MD;  Location: [facility];  Service: Urology;  Laterality: Right;  3 HRS   ROBOT ASSISTED PYELOPLASTY Right 06/07/2018   Procedure: attempted XI ROBOTIC ASSISTED PYELOPLASTY, lysis of adhesions;  Surgeon: Sherrilee Belvie CROME, MD;  Location: [facility];  Service: Urology;  Laterality: Right;  3 HRS   SKIN BIOPSY     SPACE OAR INSTILLATION N/A 07/28/2019   Procedure: SPACE OAR INSTILLATION;  Surgeon: Sherrilee Belvie CROME, MD;  Location: [facility];  Service: Urology;  Laterality: N/A;   URETEROSCOPY WITH HOLMIUM LASER  LITHOTRIPSY Right 09/24/2017   Procedure: CYSTOSCOPY, URETEROSCOPY WITH HOLMIUM LASER LITHOTRIPSY, STENT PLACEMENT;  Surgeon: Ottelin, Mark, MD;  Location: [facility];  Service: Urology;  Laterality: Right;    FAMILY HISTORY: Family History  Problem Relation Age of Onset   Heart disease Mother    Hypertension Mother    Stroke Mother    Colon cancer Mother 49   Breast cancer Mother 30   Heart disease Father        pacemaker   Aortic aneurysm Father    Hypertension Father    Lung cancer Father 56   Heart disease Sister    Atrial fibrillation Sister    Obesity Sister    Sleep apnea Sister    Heart attack Brother    Other Brother        muscle disease   Arthritis Brother    Stroke Brother    Atrial fibrillation Brother    Heart attack Brother    Atrial fibrillation Brother    Diabetes Brother    Atrial fibrillation Brother    Heart attack Brother    Hypertension Brother    Hyperlipidemia Brother    Heart attack Brother    Other Brother        heart valve operation   Atrial fibrillation Brother    Heart attack Maternal Grandmother    Diabetes Maternal Grandmother    Cancer Maternal Grandfather 44       hodgin's lymphoma   Heart attack Paternal Grandmother    Anxiety disorder Paternal Grandmother    Pneumonia Paternal Grandfather    Liver cancer Nephew 2       great nephew; d. 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[employer]    Employer: [employer]  Tobacco Use   Smoking status: Former    Current packs/day: 0.00    Average packs/day: 1 pack/day for 6.0 years (6.0 ttl pk-yrs)     Types: Cigarettes    Start date: 01/11/1969    Quit date: 01/12/1969    Years since quitting: 54.6   Smokeless tobacco: Never   Tobacco comments:    1965-1971  Vaping Use   Vaping status: Never Used  Substance and Sexual Activity   Alcohol use: Not Currently    Comment: 2 whiskey drinks per day   Drug use: Never   Sexual activity: Yes  Other Topics Concern   Not on file  Social History Narrative   The patient is married for the second time, he has 3 sons.   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   2 alcoholic beverages most days.   1 caffeinated beverage daily   No drug use no current tobacco use he is a prior smoker   12/24/2016   Social Drivers of Health   Financial Resource Strain: Medium Risk (03/02/2023)   Overall Financial Resource Strain (CARDIA)  Difficulty of Paying Living Expenses: Somewhat hard  Food Insecurity: Low Risk  (06/10/2023)   Received from [facility]   Hunger Vital Sign    Within the past 12 months, you worried that your food would run out before you got money to buy more: Never true    Within the past 12 months, the food you bought just didn't last and you didn't have money to get more. : Never true  Transportation Needs: No Transportation Needs (06/10/2023)   Received from [facility]    In the past 12 months, has lack of reliable transportation kept you from medical appointments, meetings, work or from getting things needed for daily living? : No  Physical Activity: Inactive (04/05/2023)   Exercise Vital Sign    Days of Exercise per Week: 0 days    Minutes of Exercise per Session: 0 min  Stress: Stress Concern Present (04/05/2023)   [employer] of Occupational Health - Occupational Stress Questionnaire    Feeling of Stress : To some extent  Social Connections: Moderately Isolated (03/02/2023)   Social Connection and Isolation Panel    Frequency of Communication with Friends and Family: More than three times a week     Frequency of Social Gatherings with Friends and Family: Twice a week    Attends Religious Services: Never    Database administrator or Organizations: No    Attends Engineer, structural: Not on file    Marital Status: Married  Catering manager Violence: Not At Risk (03/02/2023)   Humiliation, Afraid, Rape, and Kick questionnaire    Fear of Current or Ex-Partner: No    Emotionally Abused: No    Physically Abused: No    Sexually Abused: No      Modena Callander, M.D. Ph.D.  [facility] [address] [place], [place] [address] Ph: [phone] Fax: [phone]  CC:  Arvell Evalene SAUNDERS, DO [address] [place],  [place] [address]  Domenica Harlene LABOR, MD    Total time spent reviewing the chart, obtaining history, examined patient, ordering tests, documentation, consultations and family, care coordination was  40 minutes

## 2023-08-19 DIAGNOSIS — K5900 Constipation, unspecified: Secondary | ICD-10-CM | POA: Diagnosis not present

## 2023-08-19 DIAGNOSIS — M1990 Unspecified osteoarthritis, unspecified site: Secondary | ICD-10-CM | POA: Diagnosis not present

## 2023-08-19 DIAGNOSIS — Z7901 Long term (current) use of anticoagulants: Secondary | ICD-10-CM | POA: Diagnosis not present

## 2023-08-19 DIAGNOSIS — R1310 Dysphagia, unspecified: Secondary | ICD-10-CM | POA: Diagnosis not present

## 2023-08-19 DIAGNOSIS — C4371 Malignant melanoma of right lower limb, including hip: Secondary | ICD-10-CM | POA: Diagnosis not present

## 2023-08-19 DIAGNOSIS — E785 Hyperlipidemia, unspecified: Secondary | ICD-10-CM | POA: Diagnosis not present

## 2023-08-19 DIAGNOSIS — Z556 Problems related to health literacy: Secondary | ICD-10-CM | POA: Diagnosis not present

## 2023-08-19 DIAGNOSIS — Z483 Aftercare following surgery for neoplasm: Secondary | ICD-10-CM | POA: Diagnosis not present

## 2023-08-19 DIAGNOSIS — Z604 Social exclusion and rejection: Secondary | ICD-10-CM | POA: Diagnosis not present

## 2023-08-19 DIAGNOSIS — Z9181 History of falling: Secondary | ICD-10-CM | POA: Diagnosis not present

## 2023-08-19 DIAGNOSIS — N183 Chronic kidney disease, stage 3 unspecified: Secondary | ICD-10-CM | POA: Diagnosis not present

## 2023-08-19 DIAGNOSIS — I129 Hypertensive chronic kidney disease with stage 1 through stage 4 chronic kidney disease, or unspecified chronic kidney disease: Secondary | ICD-10-CM | POA: Diagnosis not present

## 2023-08-19 DIAGNOSIS — Z48298 Encounter for aftercare following other organ transplant: Secondary | ICD-10-CM | POA: Diagnosis not present

## 2023-08-19 DIAGNOSIS — Z79899 Other long term (current) drug therapy: Secondary | ICD-10-CM | POA: Diagnosis not present

## 2023-08-19 DIAGNOSIS — I4891 Unspecified atrial fibrillation: Secondary | ICD-10-CM | POA: Diagnosis not present

## 2023-08-19 DIAGNOSIS — Z945 Skin transplant status: Secondary | ICD-10-CM | POA: Diagnosis not present

## 2023-08-20 NOTE — Procedures (Signed)
 Full Name: David [name] Gender: Male MRN #: [id number] Date of Birth: 1945/06/16    Visit Date: 08/18/2023 10:00 Age: 78 Years Examining Physician: Onita Duos Referring Physician: Onita Height: 5 feet 11 inch History: 78 year old demented male, presenting with progressive left foot weakness, gait abnormality  Summary of the test:  Nerve conduction study: Left sural, superficial peroneal sensory responses were absent.  Left tibial, peroneal motor responses were absent.  Left ulnar, median sensory response showed moderately prolonged peak latency with mild to moderately decreased snap amplitude.  Left radial sensory response was close to normal  Left ulnar motor response showed mildly prolonged distal latency, low normal range CMAP amplitude,  Left median motor response showed mildly prolonged distal latency.  Electromyography: Selected needle examination of left lower extremity muscles, lumbosacral paraspinal muscles, left upper extremity and cervical paraspinal muscles were performed.  There is evidence of active neuropathic changes involving left L4-5 S1 myotomes.  There is also evidence of active denervation, involving left lower lumbar paraspinal muscles.  There is evidence of mild chronic neuropathic changes involving left C7-T1 myotomes.  There is no evidence of active denervation in left cervical paraspinal muscles.   Conclusion: This is an abnormal study.  There is electrodiagnostic evidence of moderately severe left L4-5 S1 lumbosacral radiculopathy, in the background of moderate axonal sensorimotor polyneuropathy.  There is also evidence of mild chronic left C7-T1 cervical radiculopathy.    ------------------------------- Duos Onita. M.D. Ph.D.   [facility] [address] [place], [place] [address] Tel: [phone] Fax: [phone]  Verbal informed consent was obtained from the patient, patient was informed of potential risk of  procedure, including bruising, bleeding, hematoma formation, infection, muscle weakness, muscle pain, numbness, among others.        MNC    Nerve / Sites Muscle Latency Ref. Amplitude Ref. Rel Amp Segments Distance Velocity Ref. Area    ms ms mV mV %  cm m/s m/s mVms  L Median - APB     Wrist APB 4.9 <=4.4 7.0 >=4.0 100 Wrist - APB 7   21.3     Upper arm APB 10.2  5.7  81.7 Upper arm - Wrist 26 49 >=49 21.2  L Ulnar - ADM     Wrist ADM 4.4 <=3.3 6.0 >=6.0 100 Wrist - ADM 7   23.2     B.Elbow ADM 7.9  5.2  87.2 B.Elbow - Wrist 18 51 >=49 23.7     A.Elbow ADM 10.5  4.0  76.6 A.Elbow - B.Elbow 13 51 >=49 18.3  L Peroneal - EDB     Ankle EDB NR <=6.5 NR >=2.0 NR Ankle - EDB 9   NR         Pop fossa - Ankle      L Tibial - AH     Ankle AH NR <=5.8 NR >=4.0 NR Ankle - AH 9   NR               SNC    Nerve / Sites Rec. Site Peak Lat Ref.  Amp Ref. Segments Distance    ms ms V V  cm  L Radial - Anatomical snuff box (Forearm)     Forearm Wrist 2.5 <=2.9 11 >=15 Forearm - Wrist 10  L Sural - Ankle (Calf)     Calf Ankle NR <=4.4 NR >=6 Calf - Ankle 14  L Superficial peroneal - Ankle     Lat leg Ankle  NR <=4.4 NR >=6 Lat leg - Ankle 14  L Median - Orthodromic (Dig II, Mid palm)     Dig II Wrist 4.1 <=3.4 7 >=10 Dig II - Wrist 13  L Ulnar - Orthodromic, (Dig V, Mid palm)     Dig V Wrist 3.8 <=3.1 3 >=5 Dig V - Wrist 42               F  Wave    Nerve F Lat Ref.   ms ms  L Ulnar - ADM 36.7 <=32.0       EMG Summary Table    Spontaneous MUAP Recruitment  Muscle IA Fib PSW Fasc Other Amp Dur. Poly Pattern  L. Tibialis anterior Increased 2+ 2+ None _______ Normal Normal Normal Discrete  L. Peroneus longus Increased 2+ 2+ None _______ Decreased Decreased 2+ Discrete  L. Tibialis posterior Increased 2+ 2+ None _______ Increased Increased 2+ Reduced  L. Vastus lateralis Increased 1+ None None _______ Increased Increased 1+ Reduced  L. Biceps femoris (long head) Increased None None None  _______ Increased Increased 1+ Reduced  L. Gluteus medius Increased None None None _______ Increased Increased 1+ Reduced  L. Lumbar paraspinals (low) Increased 1+ None None _______ Normal Normal Normal Normal  L. Lumbar paraspinals (mid) Increased 1+ None None _______ Normal Normal Normal Normal  L. First dorsal interosseous Increased None None None _______ Normal Normal Normal Reduced  L. Extensor digitorum communis Normal None None None _______ Normal Normal Normal Normal  L. Triceps brachii Increased None None None _______ Increased Increased 1+ Reduced  L. Biceps brachii Normal None None None _______ Normal Normal Normal Normal  L. Deltoid Normal None None None _______ Normal Normal Normal Normal  L. Cervical paraspinals Normal None None None _______ Normal Normal Normal Normal

## 2023-08-21 ENCOUNTER — Telehealth: Payer: Self-pay | Admitting: Neurology

## 2023-08-21 NOTE — Telephone Encounter (Signed)
 Need to call patient about MRI pelvic, significant abnormality on EMG/NCS.

## 2023-08-23 NOTE — Telephone Encounter (Signed)
 Pt wife was returning Phone call , Informed that Nurse will give call back

## 2023-08-24 DIAGNOSIS — R1310 Dysphagia, unspecified: Secondary | ICD-10-CM | POA: Diagnosis not present

## 2023-08-24 DIAGNOSIS — E785 Hyperlipidemia, unspecified: Secondary | ICD-10-CM | POA: Diagnosis not present

## 2023-08-24 DIAGNOSIS — M21372 Foot drop, left foot: Secondary | ICD-10-CM | POA: Diagnosis not present

## 2023-08-24 DIAGNOSIS — Z604 Social exclusion and rejection: Secondary | ICD-10-CM | POA: Diagnosis not present

## 2023-08-24 DIAGNOSIS — I4891 Unspecified atrial fibrillation: Secondary | ICD-10-CM | POA: Diagnosis not present

## 2023-08-24 DIAGNOSIS — Z9181 History of falling: Secondary | ICD-10-CM | POA: Diagnosis not present

## 2023-08-24 DIAGNOSIS — Z556 Problems related to health literacy: Secondary | ICD-10-CM | POA: Diagnosis not present

## 2023-08-24 DIAGNOSIS — K5900 Constipation, unspecified: Secondary | ICD-10-CM | POA: Diagnosis not present

## 2023-08-24 DIAGNOSIS — I129 Hypertensive chronic kidney disease with stage 1 through stage 4 chronic kidney disease, or unspecified chronic kidney disease: Secondary | ICD-10-CM | POA: Diagnosis not present

## 2023-08-24 DIAGNOSIS — Z7901 Long term (current) use of anticoagulants: Secondary | ICD-10-CM | POA: Diagnosis not present

## 2023-08-24 DIAGNOSIS — M1990 Unspecified osteoarthritis, unspecified site: Secondary | ICD-10-CM | POA: Diagnosis not present

## 2023-08-24 DIAGNOSIS — Z483 Aftercare following surgery for neoplasm: Secondary | ICD-10-CM | POA: Diagnosis not present

## 2023-08-24 DIAGNOSIS — S90811A Abrasion, right foot, initial encounter: Secondary | ICD-10-CM | POA: Diagnosis not present

## 2023-08-24 DIAGNOSIS — Z48298 Encounter for aftercare following other organ transplant: Secondary | ICD-10-CM | POA: Diagnosis not present

## 2023-08-24 DIAGNOSIS — C4371 Malignant melanoma of right lower limb, including hip: Secondary | ICD-10-CM | POA: Diagnosis not present

## 2023-08-24 DIAGNOSIS — N183 Chronic kidney disease, stage 3 unspecified: Secondary | ICD-10-CM | POA: Diagnosis not present

## 2023-08-24 DIAGNOSIS — Z945 Skin transplant status: Secondary | ICD-10-CM | POA: Diagnosis not present

## 2023-08-24 DIAGNOSIS — Z79899 Other long term (current) drug therapy: Secondary | ICD-10-CM | POA: Diagnosis not present

## 2023-08-24 MED ORDER — PREGABALIN 25 MG PO CAPS: 25.0000 mg | ORAL_CAPSULE | Freq: Three times a day (TID) | ORAL | 5 refills | Status: AC

## 2023-08-24 NOTE — Addendum Note (Signed)
 Addended by: Aspen Deterding on: 08/24/2023 03:09 PM   Modules accepted: Orders

## 2023-08-24 NOTE — Telephone Encounter (Signed)
 Could not reach patient, left message again, x 3 consecutive days.  Meds ordered this encounter  Medications   pregabalin  (LYRICA ) 25 MG capsule    Sig: Take 1 capsule (25 mg total) by mouth 3 (three) times daily.    Dispense:  90 capsule    Refill:  5     Let patient and family know, I have d/c gabapentin , replace with low dose Lyrica  25mg  tid, ordered MRI pelvic to evaluation his left leg weakness, gait abnormality

## 2023-08-25 NOTE — Telephone Encounter (Signed)
 Contact Information [phone] (Mobile)  [phone] [facility] Phone)   Alternate Contact Person   +1 more    Voller,Ginger (Spouse) [phone] (Mobile)

## 2023-08-26 DIAGNOSIS — E785 Hyperlipidemia, unspecified: Secondary | ICD-10-CM | POA: Diagnosis not present

## 2023-08-26 DIAGNOSIS — R1310 Dysphagia, unspecified: Secondary | ICD-10-CM | POA: Diagnosis not present

## 2023-08-26 DIAGNOSIS — Z48298 Encounter for aftercare following other organ transplant: Secondary | ICD-10-CM | POA: Diagnosis not present

## 2023-08-26 DIAGNOSIS — Z9181 History of falling: Secondary | ICD-10-CM | POA: Diagnosis not present

## 2023-08-26 DIAGNOSIS — Z7901 Long term (current) use of anticoagulants: Secondary | ICD-10-CM | POA: Diagnosis not present

## 2023-08-26 DIAGNOSIS — K5900 Constipation, unspecified: Secondary | ICD-10-CM | POA: Diagnosis not present

## 2023-08-26 DIAGNOSIS — Z79899 Other long term (current) drug therapy: Secondary | ICD-10-CM | POA: Diagnosis not present

## 2023-08-26 DIAGNOSIS — Z556 Problems related to health literacy: Secondary | ICD-10-CM | POA: Diagnosis not present

## 2023-08-26 DIAGNOSIS — C4371 Malignant melanoma of right lower limb, including hip: Secondary | ICD-10-CM | POA: Diagnosis not present

## 2023-08-26 DIAGNOSIS — I129 Hypertensive chronic kidney disease with stage 1 through stage 4 chronic kidney disease, or unspecified chronic kidney disease: Secondary | ICD-10-CM | POA: Diagnosis not present

## 2023-08-26 DIAGNOSIS — I4891 Unspecified atrial fibrillation: Secondary | ICD-10-CM | POA: Diagnosis not present

## 2023-08-26 DIAGNOSIS — Z945 Skin transplant status: Secondary | ICD-10-CM | POA: Diagnosis not present

## 2023-08-26 DIAGNOSIS — N183 Chronic kidney disease, stage 3 unspecified: Secondary | ICD-10-CM | POA: Diagnosis not present

## 2023-08-26 DIAGNOSIS — Z483 Aftercare following surgery for neoplasm: Secondary | ICD-10-CM | POA: Diagnosis not present

## 2023-08-26 DIAGNOSIS — Z604 Social exclusion and rejection: Secondary | ICD-10-CM | POA: Diagnosis not present

## 2023-08-26 DIAGNOSIS — M1990 Unspecified osteoarthritis, unspecified site: Secondary | ICD-10-CM | POA: Diagnosis not present

## 2023-08-27 ENCOUNTER — Telehealth: Payer: Self-pay

## 2023-08-27 DIAGNOSIS — M1990 Unspecified osteoarthritis, unspecified site: Secondary | ICD-10-CM | POA: Diagnosis not present

## 2023-08-27 DIAGNOSIS — Z7901 Long term (current) use of anticoagulants: Secondary | ICD-10-CM | POA: Diagnosis not present

## 2023-08-27 DIAGNOSIS — I129 Hypertensive chronic kidney disease with stage 1 through stage 4 chronic kidney disease, or unspecified chronic kidney disease: Secondary | ICD-10-CM | POA: Diagnosis not present

## 2023-08-27 DIAGNOSIS — Z79899 Other long term (current) drug therapy: Secondary | ICD-10-CM | POA: Diagnosis not present

## 2023-08-27 DIAGNOSIS — I4891 Unspecified atrial fibrillation: Secondary | ICD-10-CM | POA: Diagnosis not present

## 2023-08-27 DIAGNOSIS — K5900 Constipation, unspecified: Secondary | ICD-10-CM | POA: Diagnosis not present

## 2023-08-27 DIAGNOSIS — N183 Chronic kidney disease, stage 3 unspecified: Secondary | ICD-10-CM | POA: Diagnosis not present

## 2023-08-27 DIAGNOSIS — Z483 Aftercare following surgery for neoplasm: Secondary | ICD-10-CM | POA: Diagnosis not present

## 2023-08-27 DIAGNOSIS — Z9181 History of falling: Secondary | ICD-10-CM | POA: Diagnosis not present

## 2023-08-27 DIAGNOSIS — R1310 Dysphagia, unspecified: Secondary | ICD-10-CM | POA: Diagnosis not present

## 2023-08-27 DIAGNOSIS — Z48298 Encounter for aftercare following other organ transplant: Secondary | ICD-10-CM | POA: Diagnosis not present

## 2023-08-27 DIAGNOSIS — Z556 Problems related to health literacy: Secondary | ICD-10-CM | POA: Diagnosis not present

## 2023-08-27 DIAGNOSIS — Z945 Skin transplant status: Secondary | ICD-10-CM | POA: Diagnosis not present

## 2023-08-27 DIAGNOSIS — Z604 Social exclusion and rejection: Secondary | ICD-10-CM | POA: Diagnosis not present

## 2023-08-27 DIAGNOSIS — E785 Hyperlipidemia, unspecified: Secondary | ICD-10-CM | POA: Diagnosis not present

## 2023-08-27 DIAGNOSIS — C4371 Malignant melanoma of right lower limb, including hip: Secondary | ICD-10-CM | POA: Diagnosis not present

## 2023-08-27 NOTE — Progress Notes (Unsigned)
 Complex Care Management Note Care Guide Note  08/27/2023 Name: David [name] MRN: [id number] DOB: Apr 11, 1945   Complex Care Management Outreach Attempts: An unsuccessful telephone outreach was attempted today to offer the patient information about available complex care management services.  Follow Up Plan:  Additional outreach attempts will be made to offer the patient complex care management information and services.   Encounter Outcome:  No Answer  Dreama [facility]  [facility], [facility] Care Management Assistant Direct Dial: [phone]  Fax: [phone]

## 2023-08-30 ENCOUNTER — Ambulatory Visit (INDEPENDENT_AMBULATORY_CARE_PROVIDER_SITE_OTHER): Admitting: Orthopedic Surgery

## 2023-08-30 DIAGNOSIS — M5416 Radiculopathy, lumbar region: Secondary | ICD-10-CM | POA: Diagnosis not present

## 2023-08-30 NOTE — Progress Notes (Signed)
 Orthopedic Spine Surgery Office Note   Assessment: Patient is a 78 y.o. male with significant weakness in both his hip abductors and dorsiflexors of the foot on the left side.  Has bilateral pars defects and a spondylolisthesis causing bilateral foraminal stenosis     Plan: I went over things again with the patient and this time his wife.  I reiterated that, given the severity of his foot drop and duration of the symptoms, I do not expect it to get better with surgical decompression.  However, his hip abductors may get stronger with surgical intervention.  I do not have a brace to recommend for the hip abductor issue.  Since he has noticed some improvement with the foot drop brace, he should continue to use that.  After thinking about it, the patient and his wife did not want to pursue any kind of surgical intervention.  She said that this is not something that he ever really consider for him at this point. I informed them both that the longer duration that he has weakness, the less likely that surgery would provide benefit in which case I would no longer talk about surgery as an option. They both reiterated that surgery is not a good option for him. Given that, I told him that should continue to work with neurology and physical therapy to maximize his function. Patient can follow-up on an as-needed basis.     Patient expressed understanding of the plan and all questions were answered to the patient's satisfaction.    ___________________________________________________________________________     History:   Patient is a 78 y.o. male who presents today for lumbar spine.  Patient continues to have weakness in his left foot and difficulty with ambulating with this weakness.  Has been using a walker.  He has noticed some improvement with use of the foot drop brace.  He still does have difficulty with ambulating and requires the walker at all times.  He is not noticing any low back or radiating leg pain.   He has not noticed any new symptoms since he was last seen in the office.    Treatments tried: PT, Tylenol , walker, foot drop brace     Physical Exam:   General: no acute distress, appears stated age Neurologic: alert, answering questions appropriately, following commands Respiratory: unlabored breathing on room air, symmetric chest rise Psychiatric: appropriate affect, normal cadence to speech     MSK (spine):   -Strength exam                                                   Left                  Right EHL                              0/5                  5/5 TA                                 0/5                  5/5 GSC  5/5                  5/5 Knee extension            5/5                  5/5 Hip flexion                    5/5                  5/5   Left side hip abductor weakness with 2/5 strength   -Sensory exam                           Sensation intact to light touch in L3-S1 nerve distributions of bilateral lower extremities   Imaging: MRI of the lumbar spine from 04/18/2023 was previously independently reviewed and interpreted, showing DDD at L2/3 and L5/S1.  Right-sided foraminal stenosis at L4/5.  Bilateral pars defects and bilateral foraminal stenosis at L5/S1.  No other significant stenosis seen.     Patient name: David [name] Patient MRN: [id number] Date of visit: 08/30/23

## 2023-08-31 ENCOUNTER — Ambulatory Visit (HOSPITAL_COMMUNITY)
Admission: RE | Admit: 2023-08-31 | Discharge: 2023-08-31 | Disposition: A | Discharge status: Home / Self Care | Source: Ambulatory Visit | Attending: Neurology | Admitting: Neurology

## 2023-08-31 ENCOUNTER — Ambulatory Visit: Payer: Self-pay | Admitting: Neurology

## 2023-08-31 DIAGNOSIS — K5730 Diverticulosis of large intestine without perforation or abscess without bleeding: Secondary | ICD-10-CM | POA: Diagnosis not present

## 2023-08-31 DIAGNOSIS — Z945 Skin transplant status: Secondary | ICD-10-CM | POA: Diagnosis not present

## 2023-08-31 DIAGNOSIS — S3210XA Unspecified fracture of sacrum, initial encounter for closed fracture: Secondary | ICD-10-CM | POA: Diagnosis not present

## 2023-08-31 DIAGNOSIS — M47816 Spondylosis without myelopathy or radiculopathy, lumbar region: Secondary | ICD-10-CM | POA: Diagnosis not present

## 2023-08-31 DIAGNOSIS — R269 Unspecified abnormalities of gait and mobility: Secondary | ICD-10-CM | POA: Diagnosis not present

## 2023-08-31 DIAGNOSIS — R1310 Dysphagia, unspecified: Secondary | ICD-10-CM | POA: Diagnosis not present

## 2023-08-31 DIAGNOSIS — Z48298 Encounter for aftercare following other organ transplant: Secondary | ICD-10-CM | POA: Diagnosis not present

## 2023-08-31 DIAGNOSIS — F03B3 Unspecified dementia, moderate, with mood disturbance: Secondary | ICD-10-CM | POA: Diagnosis present

## 2023-08-31 DIAGNOSIS — E785 Hyperlipidemia, unspecified: Secondary | ICD-10-CM | POA: Diagnosis not present

## 2023-08-31 DIAGNOSIS — R609 Edema, unspecified: Secondary | ICD-10-CM | POA: Diagnosis not present

## 2023-08-31 DIAGNOSIS — Z79899 Other long term (current) drug therapy: Secondary | ICD-10-CM | POA: Diagnosis not present

## 2023-08-31 DIAGNOSIS — C4371 Malignant melanoma of right lower limb, including hip: Secondary | ICD-10-CM | POA: Diagnosis not present

## 2023-08-31 DIAGNOSIS — Z483 Aftercare following surgery for neoplasm: Secondary | ICD-10-CM | POA: Diagnosis not present

## 2023-08-31 DIAGNOSIS — R4189 Other symptoms and signs involving cognitive functions and awareness: Secondary | ICD-10-CM | POA: Insufficient documentation

## 2023-08-31 DIAGNOSIS — N183 Chronic kidney disease, stage 3 unspecified: Secondary | ICD-10-CM | POA: Diagnosis not present

## 2023-08-31 DIAGNOSIS — Z556 Problems related to health literacy: Secondary | ICD-10-CM | POA: Diagnosis not present

## 2023-08-31 DIAGNOSIS — Z9181 History of falling: Secondary | ICD-10-CM | POA: Diagnosis not present

## 2023-08-31 DIAGNOSIS — I4891 Unspecified atrial fibrillation: Secondary | ICD-10-CM | POA: Diagnosis not present

## 2023-08-31 DIAGNOSIS — K5900 Constipation, unspecified: Secondary | ICD-10-CM | POA: Diagnosis not present

## 2023-08-31 DIAGNOSIS — Z604 Social exclusion and rejection: Secondary | ICD-10-CM | POA: Diagnosis not present

## 2023-08-31 DIAGNOSIS — M1990 Unspecified osteoarthritis, unspecified site: Secondary | ICD-10-CM | POA: Diagnosis not present

## 2023-08-31 DIAGNOSIS — Z7901 Long term (current) use of anticoagulants: Secondary | ICD-10-CM | POA: Diagnosis not present

## 2023-08-31 DIAGNOSIS — I129 Hypertensive chronic kidney disease with stage 1 through stage 4 chronic kidney disease, or unspecified chronic kidney disease: Secondary | ICD-10-CM | POA: Diagnosis not present

## 2023-08-31 LAB — ATN PROFILE
A -- Beta-amyloid 42/40 Ratio: 0.103 (ref >0.102)
Beta-amyloid 40: 265.47 pg/mL
Beta-amyloid 42: 27.47 pg/mL
N -- NfL, Plasma: 51.4 pg/mL — ABNORMAL HIGH (ref 0.00–6.04)
T -- p-tau181: 3.15 pg/mL — ABNORMAL HIGH (ref 0.00–0.97)

## 2023-08-31 LAB — APOE ALZHEIMER'S RISK

## 2023-08-31 MED ORDER — GADOBUTROL 1 MMOL/ML IV SOLN
9.0000 mL | Freq: Once | INTRAVENOUS | Status: AC | PRN
  Administered 2023-08-31: 9 mL via INTRAVENOUS

## 2023-08-31 MED ORDER — GADOBUTROL 1 MMOL/ML IV SOLN: 9.0000 mL | Freq: Once | INTRAVENOUS | Status: DC | PRN

## 2023-09-02 NOTE — Progress Notes (Signed)
 Complex Care Management Note Care Guide Note  09/02/2023 Name: David [name] MRN: [id number] DOB: Aug 07, 1945   Complex Care Management Outreach Attempts: A third unsuccessful outreach was attempted today to offer the patient with information about available complex care management services.  Follow Up Plan:  No further outreach attempts will be made at this time. We have been unable to contact the patient to offer or enroll patient in complex care management services.  Encounter Outcome:  No Answer  Dreama [facility]  [facility], [facility]BC[facility] Assistant Direct Dial: [phone]  Fax: [phone]

## 2023-09-07 DIAGNOSIS — Z7901 Long term (current) use of anticoagulants: Secondary | ICD-10-CM | POA: Diagnosis not present

## 2023-09-07 DIAGNOSIS — K5900 Constipation, unspecified: Secondary | ICD-10-CM | POA: Diagnosis not present

## 2023-09-07 DIAGNOSIS — Z945 Skin transplant status: Secondary | ICD-10-CM | POA: Diagnosis not present

## 2023-09-07 DIAGNOSIS — Z48298 Encounter for aftercare following other organ transplant: Secondary | ICD-10-CM | POA: Diagnosis not present

## 2023-09-07 DIAGNOSIS — Z483 Aftercare following surgery for neoplasm: Secondary | ICD-10-CM | POA: Diagnosis not present

## 2023-09-07 DIAGNOSIS — N183 Chronic kidney disease, stage 3 unspecified: Secondary | ICD-10-CM | POA: Diagnosis not present

## 2023-09-07 DIAGNOSIS — Z556 Problems related to health literacy: Secondary | ICD-10-CM | POA: Diagnosis not present

## 2023-09-07 DIAGNOSIS — E785 Hyperlipidemia, unspecified: Secondary | ICD-10-CM | POA: Diagnosis not present

## 2023-09-07 DIAGNOSIS — M1990 Unspecified osteoarthritis, unspecified site: Secondary | ICD-10-CM | POA: Diagnosis not present

## 2023-09-07 DIAGNOSIS — I129 Hypertensive chronic kidney disease with stage 1 through stage 4 chronic kidney disease, or unspecified chronic kidney disease: Secondary | ICD-10-CM | POA: Diagnosis not present

## 2023-09-07 DIAGNOSIS — R1310 Dysphagia, unspecified: Secondary | ICD-10-CM | POA: Diagnosis not present

## 2023-09-07 DIAGNOSIS — Z79899 Other long term (current) drug therapy: Secondary | ICD-10-CM | POA: Diagnosis not present

## 2023-09-07 DIAGNOSIS — Z604 Social exclusion and rejection: Secondary | ICD-10-CM | POA: Diagnosis not present

## 2023-09-07 DIAGNOSIS — C4371 Malignant melanoma of right lower limb, including hip: Secondary | ICD-10-CM | POA: Diagnosis not present

## 2023-09-07 DIAGNOSIS — I4891 Unspecified atrial fibrillation: Secondary | ICD-10-CM | POA: Diagnosis not present

## 2023-09-07 DIAGNOSIS — Z9181 History of falling: Secondary | ICD-10-CM | POA: Diagnosis not present

## 2023-09-10 ENCOUNTER — Telehealth: Payer: Self-pay

## 2023-09-10 ENCOUNTER — Encounter: Payer: Self-pay | Admitting: Neurology

## 2023-09-10 NOTE — Telephone Encounter (Signed)
 Returned Rosa w/ [facility] call and no answer left vm to return call.

## 2023-09-10 NOTE — Telephone Encounter (Signed)
 Copied from CRM [id number]. Topic: Clinical - Home Health Verbal Orders >> Sep 10, 2023  9:48 AM Laymon HERO wrote: Caller/Agency: [facility] Callback Number: [phone] Service Requested: Physical Therapy Frequency: 1x 1 Any new concerns about the patient? Yes - PT re-certfication

## 2023-09-12 DIAGNOSIS — M1990 Unspecified osteoarthritis, unspecified site: Secondary | ICD-10-CM | POA: Diagnosis not present

## 2023-09-12 DIAGNOSIS — Z604 Social exclusion and rejection: Secondary | ICD-10-CM | POA: Diagnosis not present

## 2023-09-12 DIAGNOSIS — I4891 Unspecified atrial fibrillation: Secondary | ICD-10-CM | POA: Diagnosis not present

## 2023-09-12 DIAGNOSIS — C4371 Malignant melanoma of right lower limb, including hip: Secondary | ICD-10-CM | POA: Diagnosis not present

## 2023-09-12 DIAGNOSIS — E785 Hyperlipidemia, unspecified: Secondary | ICD-10-CM | POA: Diagnosis not present

## 2023-09-12 DIAGNOSIS — R1310 Dysphagia, unspecified: Secondary | ICD-10-CM | POA: Diagnosis not present

## 2023-09-12 DIAGNOSIS — Z945 Skin transplant status: Secondary | ICD-10-CM | POA: Diagnosis not present

## 2023-09-12 DIAGNOSIS — N183 Chronic kidney disease, stage 3 unspecified: Secondary | ICD-10-CM | POA: Diagnosis not present

## 2023-09-12 DIAGNOSIS — Z7901 Long term (current) use of anticoagulants: Secondary | ICD-10-CM | POA: Diagnosis not present

## 2023-09-12 DIAGNOSIS — I129 Hypertensive chronic kidney disease with stage 1 through stage 4 chronic kidney disease, or unspecified chronic kidney disease: Secondary | ICD-10-CM | POA: Diagnosis not present

## 2023-09-12 DIAGNOSIS — K5900 Constipation, unspecified: Secondary | ICD-10-CM | POA: Diagnosis not present

## 2023-09-12 DIAGNOSIS — Z9181 History of falling: Secondary | ICD-10-CM | POA: Diagnosis not present

## 2023-09-12 DIAGNOSIS — Z483 Aftercare following surgery for neoplasm: Secondary | ICD-10-CM | POA: Diagnosis not present

## 2023-09-12 DIAGNOSIS — Z79899 Other long term (current) drug therapy: Secondary | ICD-10-CM | POA: Diagnosis not present

## 2023-09-12 DIAGNOSIS — Z556 Problems related to health literacy: Secondary | ICD-10-CM | POA: Diagnosis not present

## 2023-09-12 DIAGNOSIS — Z48298 Encounter for aftercare following other organ transplant: Secondary | ICD-10-CM | POA: Diagnosis not present

## 2023-09-14 ENCOUNTER — Other Ambulatory Visit

## 2023-09-14 ENCOUNTER — Telehealth: Payer: Self-pay

## 2023-09-14 NOTE — Telephone Encounter (Signed)
 Copied from CRM [id number]. Topic: Clinical - Home Health Verbal Orders >> Sep 14, 2023 10:09 AM Richerd A wrote: Caller/Agency: [facility] Callback Number: [phone] Service Requested: Physical Therapy Frequency: 1x week for 1 week, 2x week for 4 weeks beginning 09/16/23 Any new concerns about the patient? No

## 2023-09-15 ENCOUNTER — Telehealth: Payer: Self-pay | Admitting: Radiology

## 2023-09-15 ENCOUNTER — Other Ambulatory Visit: Payer: Self-pay | Admitting: Family Medicine

## 2023-09-15 ENCOUNTER — Telehealth: Payer: Self-pay | Admitting: Neurology

## 2023-09-15 NOTE — Telephone Encounter (Signed)
 Please call patient, MRI of pelvic region showed subacute nondisplaced fracture of sacral at S4 level, if he is in a lot of pain, may contact his primary care, or even orthopedic evaluation,   There is no other acute abnormality, continued evidence of lumbar degenerative changes,   1. Subacute nondisplaced fracture of the sacrum at the S4 level with minimal adjacent marrow edema. 2. Mild subcutaneous edema within the soft tissues of the inferomedial left gluteal region, nonspecific. No fluid collections. 3. Chronic degenerative changes of the lower lumbar spine, partially imaged, including chronic bilateral L5 pars interarticularis defects and L5-S1 spondylolisthesis with bilateral neural foraminal stenosis.

## 2023-09-15 NOTE — Telephone Encounter (Signed)
 Lvm 1st attempt by hf 09/15/23

## 2023-09-15 NOTE — Telephone Encounter (Signed)
 Copied from CRM [id number]. Topic: Clinical - Home Health Verbal Orders >> Sep 14, 2023 10:09 AM Richerd A wrote: Caller/Agency: [facility] Callback Number: [phone] Service Requested: Physical Therapy Frequency: 1x week for 1 week, 2x week for 4 weeks beginning 09/16/23 Any new concerns about the patient? No >> Sep 15, 2023  8:46 AM Robinson DEL wrote: Hadassah with [facility] following up on verbal for physical therapy sent on 9/2.  Hadassah [phone] Secure line

## 2023-09-15 NOTE — Telephone Encounter (Addendum)
 Verbal order giving to [facility]  for PT. Just an FYI

## 2023-09-16 DIAGNOSIS — R1310 Dysphagia, unspecified: Secondary | ICD-10-CM | POA: Diagnosis not present

## 2023-09-16 DIAGNOSIS — Z945 Skin transplant status: Secondary | ICD-10-CM | POA: Diagnosis not present

## 2023-09-16 DIAGNOSIS — I129 Hypertensive chronic kidney disease with stage 1 through stage 4 chronic kidney disease, or unspecified chronic kidney disease: Secondary | ICD-10-CM | POA: Diagnosis not present

## 2023-09-16 DIAGNOSIS — N183 Chronic kidney disease, stage 3 unspecified: Secondary | ICD-10-CM | POA: Diagnosis not present

## 2023-09-16 DIAGNOSIS — Z79899 Other long term (current) drug therapy: Secondary | ICD-10-CM | POA: Diagnosis not present

## 2023-09-16 DIAGNOSIS — Z7901 Long term (current) use of anticoagulants: Secondary | ICD-10-CM | POA: Diagnosis not present

## 2023-09-16 DIAGNOSIS — Z48298 Encounter for aftercare following other organ transplant: Secondary | ICD-10-CM | POA: Diagnosis not present

## 2023-09-16 DIAGNOSIS — I4891 Unspecified atrial fibrillation: Secondary | ICD-10-CM | POA: Diagnosis not present

## 2023-09-16 DIAGNOSIS — M1990 Unspecified osteoarthritis, unspecified site: Secondary | ICD-10-CM | POA: Diagnosis not present

## 2023-09-16 DIAGNOSIS — C4371 Malignant melanoma of right lower limb, including hip: Secondary | ICD-10-CM | POA: Diagnosis not present

## 2023-09-16 DIAGNOSIS — Z9181 History of falling: Secondary | ICD-10-CM | POA: Diagnosis not present

## 2023-09-16 DIAGNOSIS — Z483 Aftercare following surgery for neoplasm: Secondary | ICD-10-CM | POA: Diagnosis not present

## 2023-09-16 DIAGNOSIS — Z604 Social exclusion and rejection: Secondary | ICD-10-CM | POA: Diagnosis not present

## 2023-09-16 DIAGNOSIS — K5900 Constipation, unspecified: Secondary | ICD-10-CM | POA: Diagnosis not present

## 2023-09-16 DIAGNOSIS — Z556 Problems related to health literacy: Secondary | ICD-10-CM | POA: Diagnosis not present

## 2023-09-16 DIAGNOSIS — E785 Hyperlipidemia, unspecified: Secondary | ICD-10-CM | POA: Diagnosis not present

## 2023-09-16 NOTE — Telephone Encounter (Signed)
 Pt's wife has returned call to [facility]

## 2023-09-16 NOTE — Telephone Encounter (Signed)
 Requesting: lorazepam  1mg   Contract:01/20/23 UDS:12/08/22 Last Visit: 08/02/23 Next Visit: 10/19/23 w/ Noemi Y Last Refill: 06/01/23 #90 and 1RF   Please Advise

## 2023-09-16 NOTE — Telephone Encounter (Signed)
 Lvm 2nd attempt by hf 09/16/23

## 2023-09-16 NOTE — Telephone Encounter (Signed)
 Pt wife called and results explained in great detail and pt wife voiced understanding of all discussed

## 2023-09-17 NOTE — Telephone Encounter (Signed)
 Gave okay verbal orders

## 2023-09-21 DIAGNOSIS — Z9181 History of falling: Secondary | ICD-10-CM | POA: Diagnosis not present

## 2023-09-21 DIAGNOSIS — Z604 Social exclusion and rejection: Secondary | ICD-10-CM | POA: Diagnosis not present

## 2023-09-21 DIAGNOSIS — M1990 Unspecified osteoarthritis, unspecified site: Secondary | ICD-10-CM | POA: Diagnosis not present

## 2023-09-21 DIAGNOSIS — Z7901 Long term (current) use of anticoagulants: Secondary | ICD-10-CM | POA: Diagnosis not present

## 2023-09-21 DIAGNOSIS — N183 Chronic kidney disease, stage 3 unspecified: Secondary | ICD-10-CM | POA: Diagnosis not present

## 2023-09-21 DIAGNOSIS — Z945 Skin transplant status: Secondary | ICD-10-CM | POA: Diagnosis not present

## 2023-09-21 DIAGNOSIS — I4891 Unspecified atrial fibrillation: Secondary | ICD-10-CM | POA: Diagnosis not present

## 2023-09-21 DIAGNOSIS — I129 Hypertensive chronic kidney disease with stage 1 through stage 4 chronic kidney disease, or unspecified chronic kidney disease: Secondary | ICD-10-CM | POA: Diagnosis not present

## 2023-09-21 DIAGNOSIS — Z483 Aftercare following surgery for neoplasm: Secondary | ICD-10-CM | POA: Diagnosis not present

## 2023-09-21 DIAGNOSIS — Z48298 Encounter for aftercare following other organ transplant: Secondary | ICD-10-CM | POA: Diagnosis not present

## 2023-09-21 DIAGNOSIS — K5900 Constipation, unspecified: Secondary | ICD-10-CM | POA: Diagnosis not present

## 2023-09-21 DIAGNOSIS — C4371 Malignant melanoma of right lower limb, including hip: Secondary | ICD-10-CM | POA: Diagnosis not present

## 2023-09-21 DIAGNOSIS — E785 Hyperlipidemia, unspecified: Secondary | ICD-10-CM | POA: Diagnosis not present

## 2023-09-21 DIAGNOSIS — Z556 Problems related to health literacy: Secondary | ICD-10-CM | POA: Diagnosis not present

## 2023-09-21 DIAGNOSIS — R1310 Dysphagia, unspecified: Secondary | ICD-10-CM | POA: Diagnosis not present

## 2023-09-21 DIAGNOSIS — Z79899 Other long term (current) drug therapy: Secondary | ICD-10-CM | POA: Diagnosis not present

## 2023-09-22 NOTE — Telephone Encounter (Signed)
 I called his wife, explained that patient do have dementia, even though there was no objective evidence to confirm a diagnosis of Alzheimer's dementia, with aging, slow worsening cognitive malfunction, it is expected he has some fluctuation,  He has been eating well, sleeps okay most of the night with Seroquel ,  Patient's wife has a better understanding of the situation, will call back for new issues

## 2023-09-23 ENCOUNTER — Telehealth: Payer: Self-pay | Admitting: Urology

## 2023-09-23 DIAGNOSIS — I4891 Unspecified atrial fibrillation: Secondary | ICD-10-CM | POA: Diagnosis not present

## 2023-09-23 DIAGNOSIS — Z556 Problems related to health literacy: Secondary | ICD-10-CM | POA: Diagnosis not present

## 2023-09-23 DIAGNOSIS — Z945 Skin transplant status: Secondary | ICD-10-CM | POA: Diagnosis not present

## 2023-09-23 DIAGNOSIS — E785 Hyperlipidemia, unspecified: Secondary | ICD-10-CM | POA: Diagnosis not present

## 2023-09-23 DIAGNOSIS — I129 Hypertensive chronic kidney disease with stage 1 through stage 4 chronic kidney disease, or unspecified chronic kidney disease: Secondary | ICD-10-CM | POA: Diagnosis not present

## 2023-09-23 DIAGNOSIS — Z48298 Encounter for aftercare following other organ transplant: Secondary | ICD-10-CM | POA: Diagnosis not present

## 2023-09-23 DIAGNOSIS — R1310 Dysphagia, unspecified: Secondary | ICD-10-CM | POA: Diagnosis not present

## 2023-09-23 DIAGNOSIS — Z9181 History of falling: Secondary | ICD-10-CM | POA: Diagnosis not present

## 2023-09-23 DIAGNOSIS — Z79899 Other long term (current) drug therapy: Secondary | ICD-10-CM | POA: Diagnosis not present

## 2023-09-23 DIAGNOSIS — Z483 Aftercare following surgery for neoplasm: Secondary | ICD-10-CM | POA: Diagnosis not present

## 2023-09-23 DIAGNOSIS — K5900 Constipation, unspecified: Secondary | ICD-10-CM | POA: Diagnosis not present

## 2023-09-23 DIAGNOSIS — Z7901 Long term (current) use of anticoagulants: Secondary | ICD-10-CM | POA: Diagnosis not present

## 2023-09-23 DIAGNOSIS — N183 Chronic kidney disease, stage 3 unspecified: Secondary | ICD-10-CM | POA: Diagnosis not present

## 2023-09-23 NOTE — Telephone Encounter (Signed)
 Left voicemail for patient to call us  back. Patient wants to leave a urine sample. Dr roseann said that was ok. Also sent patient a message. He might come in on his own, if he does just add him to the schedule. Otherwise if he calls back please schedule him for a lab appt.

## 2023-09-24 ENCOUNTER — Other Ambulatory Visit: Payer: Self-pay

## 2023-09-24 ENCOUNTER — Other Ambulatory Visit

## 2023-09-24 DIAGNOSIS — R399 Unspecified symptoms and signs involving the genitourinary system: Secondary | ICD-10-CM

## 2023-09-24 LAB — URINALYSIS, ROUTINE W REFLEX MICROSCOPIC
Bilirubin, UA: NEGATIVE
Glucose, UA: NEGATIVE
Ketones, UA: NEGATIVE
Leukocytes,UA: NEGATIVE
Nitrite, UA: NEGATIVE
Protein,UA: NEGATIVE
RBC, UA: NEGATIVE
Specific Gravity, UA: 1.015 (ref 1.005–1.030)
Urobilinogen, Ur: 0.2 mg/dL (ref 0.2–1.0)
pH, UA: 6 (ref 5.0–7.5)

## 2023-09-27 ENCOUNTER — Ambulatory Visit: Payer: Self-pay | Admitting: Urology

## 2023-09-30 ENCOUNTER — Telehealth: Payer: Self-pay

## 2023-09-30 DIAGNOSIS — E785 Hyperlipidemia, unspecified: Secondary | ICD-10-CM | POA: Diagnosis not present

## 2023-09-30 DIAGNOSIS — Z79899 Other long term (current) drug therapy: Secondary | ICD-10-CM | POA: Diagnosis not present

## 2023-09-30 DIAGNOSIS — Z556 Problems related to health literacy: Secondary | ICD-10-CM | POA: Diagnosis not present

## 2023-09-30 DIAGNOSIS — C4371 Malignant melanoma of right lower limb, including hip: Secondary | ICD-10-CM | POA: Diagnosis not present

## 2023-09-30 DIAGNOSIS — Z48298 Encounter for aftercare following other organ transplant: Secondary | ICD-10-CM | POA: Diagnosis not present

## 2023-09-30 DIAGNOSIS — Z483 Aftercare following surgery for neoplasm: Secondary | ICD-10-CM | POA: Diagnosis not present

## 2023-09-30 DIAGNOSIS — M1990 Unspecified osteoarthritis, unspecified site: Secondary | ICD-10-CM | POA: Diagnosis not present

## 2023-09-30 DIAGNOSIS — K5900 Constipation, unspecified: Secondary | ICD-10-CM | POA: Diagnosis not present

## 2023-09-30 DIAGNOSIS — I129 Hypertensive chronic kidney disease with stage 1 through stage 4 chronic kidney disease, or unspecified chronic kidney disease: Secondary | ICD-10-CM | POA: Diagnosis not present

## 2023-09-30 DIAGNOSIS — I4891 Unspecified atrial fibrillation: Secondary | ICD-10-CM | POA: Diagnosis not present

## 2023-09-30 DIAGNOSIS — Z9181 History of falling: Secondary | ICD-10-CM | POA: Diagnosis not present

## 2023-09-30 DIAGNOSIS — Z7901 Long term (current) use of anticoagulants: Secondary | ICD-10-CM | POA: Diagnosis not present

## 2023-09-30 DIAGNOSIS — M1A9XX0 Chronic gout, unspecified, without tophus (tophi): Secondary | ICD-10-CM

## 2023-09-30 DIAGNOSIS — Z945 Skin transplant status: Secondary | ICD-10-CM | POA: Diagnosis not present

## 2023-09-30 DIAGNOSIS — Z604 Social exclusion and rejection: Secondary | ICD-10-CM | POA: Diagnosis not present

## 2023-09-30 DIAGNOSIS — N183 Chronic kidney disease, stage 3 unspecified: Secondary | ICD-10-CM | POA: Diagnosis not present

## 2023-09-30 DIAGNOSIS — R1310 Dysphagia, unspecified: Secondary | ICD-10-CM | POA: Diagnosis not present

## 2023-09-30 NOTE — Telephone Encounter (Signed)
 Copied from CRM [id number]. Topic: Clinical - Medication Question >> Sep 30, 2023 11:07 AM Pinkey ORN wrote: Reason for CRM: allopurinol  (ZYLOPRIM ) 100 MG tablet >> Sep 30, 2023 11:09 AM Pinkey ORN wrote: Patient's wife Ginger states his gout his worsening (refused triage) and wants to know if patient needs to start taking whole tablets of the allopurinol  (ZYLOPRIM ) 100 MG tablet. Please follow up at [phone]

## 2023-10-01 ENCOUNTER — Ambulatory Visit: Payer: Self-pay

## 2023-10-01 MED ORDER — ALLOPURINOL 100 MG PO TABS: 100.0000 mg | ORAL_TABLET | Freq: Every day | ORAL | 1 refills | Status: DC

## 2023-10-01 NOTE — Telephone Encounter (Signed)
 See other telephone note.

## 2023-10-01 NOTE — Telephone Encounter (Signed)
 Per chart review patient question address, new rx sent.   Message from [place] C sent at 10/01/2023 11:15 AM EDT  Summary: Gout   allopurinol  (ZYLOPRIM ) 100 MG tablet Patient's wife called in yesterday to see if Dr. Domenica could send in prescription for gout medication and declined nt yesterday, per crm . Today, patient's wife called back due to not being called yet from the clinic and agreed to receive a phone call from a nurse for next steps.  [phone] (M)      Reason for Disposition  Caller has already spoken with another triager and has no further questions.  Protocols used: No Contact or Duplicate Contact Call-A-AH

## 2023-10-01 NOTE — Addendum Note (Signed)
 Addended by: Pratham Cassatt C on: 10/01/2023 11:18 AM   Modules accepted: Orders

## 2023-10-01 NOTE — Telephone Encounter (Signed)
 David [name] was advised and verbalized understanding. Also new rx was send into pharmacy as well.

## 2023-10-05 DIAGNOSIS — Z945 Skin transplant status: Secondary | ICD-10-CM | POA: Diagnosis not present

## 2023-10-05 DIAGNOSIS — I129 Hypertensive chronic kidney disease with stage 1 through stage 4 chronic kidney disease, or unspecified chronic kidney disease: Secondary | ICD-10-CM | POA: Diagnosis not present

## 2023-10-05 DIAGNOSIS — I4891 Unspecified atrial fibrillation: Secondary | ICD-10-CM | POA: Diagnosis not present

## 2023-10-05 DIAGNOSIS — Z48298 Encounter for aftercare following other organ transplant: Secondary | ICD-10-CM | POA: Diagnosis not present

## 2023-10-05 DIAGNOSIS — M1990 Unspecified osteoarthritis, unspecified site: Secondary | ICD-10-CM | POA: Diagnosis not present

## 2023-10-05 DIAGNOSIS — N183 Chronic kidney disease, stage 3 unspecified: Secondary | ICD-10-CM | POA: Diagnosis not present

## 2023-10-05 DIAGNOSIS — Z604 Social exclusion and rejection: Secondary | ICD-10-CM | POA: Diagnosis not present

## 2023-10-05 DIAGNOSIS — Z79899 Other long term (current) drug therapy: Secondary | ICD-10-CM | POA: Diagnosis not present

## 2023-10-05 DIAGNOSIS — R1310 Dysphagia, unspecified: Secondary | ICD-10-CM | POA: Diagnosis not present

## 2023-10-05 DIAGNOSIS — Z9181 History of falling: Secondary | ICD-10-CM | POA: Diagnosis not present

## 2023-10-05 DIAGNOSIS — K5900 Constipation, unspecified: Secondary | ICD-10-CM | POA: Diagnosis not present

## 2023-10-05 DIAGNOSIS — Z556 Problems related to health literacy: Secondary | ICD-10-CM | POA: Diagnosis not present

## 2023-10-05 DIAGNOSIS — Z7901 Long term (current) use of anticoagulants: Secondary | ICD-10-CM | POA: Diagnosis not present

## 2023-10-05 DIAGNOSIS — E785 Hyperlipidemia, unspecified: Secondary | ICD-10-CM | POA: Diagnosis not present

## 2023-10-05 DIAGNOSIS — C4371 Malignant melanoma of right lower limb, including hip: Secondary | ICD-10-CM | POA: Diagnosis not present

## 2023-10-05 DIAGNOSIS — Z483 Aftercare following surgery for neoplasm: Secondary | ICD-10-CM | POA: Diagnosis not present

## 2023-10-07 ENCOUNTER — Other Ambulatory Visit: Payer: Self-pay | Admitting: Family Medicine

## 2023-10-07 ENCOUNTER — Encounter: Payer: Self-pay | Admitting: Family Medicine

## 2023-10-07 DIAGNOSIS — L821 Other seborrheic keratosis: Secondary | ICD-10-CM | POA: Diagnosis not present

## 2023-10-07 DIAGNOSIS — E785 Hyperlipidemia, unspecified: Secondary | ICD-10-CM | POA: Diagnosis not present

## 2023-10-07 DIAGNOSIS — K5900 Constipation, unspecified: Secondary | ICD-10-CM | POA: Diagnosis not present

## 2023-10-07 DIAGNOSIS — R269 Unspecified abnormalities of gait and mobility: Secondary | ICD-10-CM

## 2023-10-07 DIAGNOSIS — Z556 Problems related to health literacy: Secondary | ICD-10-CM | POA: Diagnosis not present

## 2023-10-07 DIAGNOSIS — Z945 Skin transplant status: Secondary | ICD-10-CM | POA: Diagnosis not present

## 2023-10-07 DIAGNOSIS — Z79899 Other long term (current) drug therapy: Secondary | ICD-10-CM | POA: Diagnosis not present

## 2023-10-07 DIAGNOSIS — C4371 Malignant melanoma of right lower limb, including hip: Secondary | ICD-10-CM | POA: Diagnosis not present

## 2023-10-07 DIAGNOSIS — M1990 Unspecified osteoarthritis, unspecified site: Secondary | ICD-10-CM | POA: Diagnosis not present

## 2023-10-07 DIAGNOSIS — Z9181 History of falling: Secondary | ICD-10-CM | POA: Diagnosis not present

## 2023-10-07 DIAGNOSIS — D1801 Hemangioma of skin and subcutaneous tissue: Secondary | ICD-10-CM | POA: Diagnosis not present

## 2023-10-07 DIAGNOSIS — R5381 Other malaise: Secondary | ICD-10-CM

## 2023-10-07 DIAGNOSIS — Z85828 Personal history of other malignant neoplasm of skin: Secondary | ICD-10-CM | POA: Diagnosis not present

## 2023-10-07 DIAGNOSIS — I4891 Unspecified atrial fibrillation: Secondary | ICD-10-CM | POA: Diagnosis not present

## 2023-10-07 DIAGNOSIS — R1310 Dysphagia, unspecified: Secondary | ICD-10-CM | POA: Diagnosis not present

## 2023-10-07 DIAGNOSIS — Z7901 Long term (current) use of anticoagulants: Secondary | ICD-10-CM | POA: Diagnosis not present

## 2023-10-07 DIAGNOSIS — Z85820 Personal history of malignant melanoma of skin: Secondary | ICD-10-CM | POA: Diagnosis not present

## 2023-10-07 DIAGNOSIS — Z604 Social exclusion and rejection: Secondary | ICD-10-CM | POA: Diagnosis not present

## 2023-10-07 DIAGNOSIS — Z48298 Encounter for aftercare following other organ transplant: Secondary | ICD-10-CM | POA: Diagnosis not present

## 2023-10-07 DIAGNOSIS — D225 Melanocytic nevi of trunk: Secondary | ICD-10-CM | POA: Diagnosis not present

## 2023-10-07 DIAGNOSIS — Z483 Aftercare following surgery for neoplasm: Secondary | ICD-10-CM | POA: Diagnosis not present

## 2023-10-07 DIAGNOSIS — I129 Hypertensive chronic kidney disease with stage 1 through stage 4 chronic kidney disease, or unspecified chronic kidney disease: Secondary | ICD-10-CM | POA: Diagnosis not present

## 2023-10-07 DIAGNOSIS — L304 Erythema intertrigo: Secondary | ICD-10-CM | POA: Diagnosis not present

## 2023-10-07 DIAGNOSIS — N183 Chronic kidney disease, stage 3 unspecified: Secondary | ICD-10-CM | POA: Diagnosis not present

## 2023-10-11 ENCOUNTER — Encounter: Payer: Self-pay | Admitting: Pharmacist

## 2023-10-11 NOTE — Progress Notes (Signed)
 Pharmacy Quality Measure Review  This patient is appearing on a report for being at risk of failing the adherence measure for cholesterol (statin) medications this calendar year.   Medication: atorvastatin  10mg  Last fill date: 05/12/2023 for 90 day supply  Reviewed recent refill history in Dr Annemarie database. Actual last refill date was 09/03/2023 for 90 day supply. Verified with [facility] Prior that Rx was picked up on 09/09/2023. Patient has no refills remaining. Next appointment with PCP is 10/18/2023 - left message in appt notes to send in updated Rx.     Insurance report was not up to date. No action needed at this time.   Madelin Ray, PharmD Clinical Pharmacist [facility]

## 2023-10-12 DIAGNOSIS — I4891 Unspecified atrial fibrillation: Secondary | ICD-10-CM | POA: Diagnosis not present

## 2023-10-12 DIAGNOSIS — N183 Chronic kidney disease, stage 3 unspecified: Secondary | ICD-10-CM | POA: Diagnosis not present

## 2023-10-12 DIAGNOSIS — R1310 Dysphagia, unspecified: Secondary | ICD-10-CM | POA: Diagnosis not present

## 2023-10-12 DIAGNOSIS — Z604 Social exclusion and rejection: Secondary | ICD-10-CM | POA: Diagnosis not present

## 2023-10-12 DIAGNOSIS — I129 Hypertensive chronic kidney disease with stage 1 through stage 4 chronic kidney disease, or unspecified chronic kidney disease: Secondary | ICD-10-CM | POA: Diagnosis not present

## 2023-10-12 DIAGNOSIS — Z556 Problems related to health literacy: Secondary | ICD-10-CM | POA: Diagnosis not present

## 2023-10-12 DIAGNOSIS — Z79899 Other long term (current) drug therapy: Secondary | ICD-10-CM | POA: Diagnosis not present

## 2023-10-12 DIAGNOSIS — E785 Hyperlipidemia, unspecified: Secondary | ICD-10-CM | POA: Diagnosis not present

## 2023-10-12 DIAGNOSIS — K5900 Constipation, unspecified: Secondary | ICD-10-CM | POA: Diagnosis not present

## 2023-10-12 DIAGNOSIS — Z483 Aftercare following surgery for neoplasm: Secondary | ICD-10-CM | POA: Diagnosis not present

## 2023-10-12 DIAGNOSIS — Z48298 Encounter for aftercare following other organ transplant: Secondary | ICD-10-CM | POA: Diagnosis not present

## 2023-10-12 DIAGNOSIS — Z9181 History of falling: Secondary | ICD-10-CM | POA: Diagnosis not present

## 2023-10-12 DIAGNOSIS — M1990 Unspecified osteoarthritis, unspecified site: Secondary | ICD-10-CM | POA: Diagnosis not present

## 2023-10-12 DIAGNOSIS — Z945 Skin transplant status: Secondary | ICD-10-CM | POA: Diagnosis not present

## 2023-10-12 DIAGNOSIS — Z7901 Long term (current) use of anticoagulants: Secondary | ICD-10-CM | POA: Diagnosis not present

## 2023-10-12 DIAGNOSIS — C4371 Malignant melanoma of right lower limb, including hip: Secondary | ICD-10-CM | POA: Diagnosis not present

## 2023-10-14 NOTE — Assessment & Plan Note (Signed)
 Resulting in unsteady gait and increased fall risk. Patient would benefit from a standard walker to improve his mobility

## 2023-10-15 DIAGNOSIS — F0390 Unspecified dementia without behavioral disturbance: Secondary | ICD-10-CM | POA: Insufficient documentation

## 2023-10-15 NOTE — Assessment & Plan Note (Deleted)
 LAST hgba1c acceptable, minimize simple carbs. Increase exercise as tolerated.

## 2023-10-15 NOTE — Assessment & Plan Note (Deleted)
 Encourage heart healthy diet such as MIND or DASH diet, increase exercise, avoid trans fats, simple carbohydrates and processed foods, consider a krill or fish or flaxseed oil cap daily.

## 2023-10-15 NOTE — Assessment & Plan Note (Deleted)
 Well controlled, no changes to meds. Encouraged heart healthy diet such as the DASH diet and exercise as tolerated.

## 2023-10-15 NOTE — Assessment & Plan Note (Deleted)
 Denies recent flares.  On allopurinol .  Hydrate and monitor.  Update uric acid level today.

## 2023-10-15 NOTE — Assessment & Plan Note (Deleted)
Stable   Denies SI/HI

## 2023-10-15 NOTE — Progress Notes (Deleted)
 Subjective:     Patient ID: David [name], male    DOB: Jan 26, 1945, 78 y.o.   MRN: [id number]  No chief complaint on file.   HPI  Discussed the use of AI scribe software for clinical note transcription with the patient, who gave verbal consent to proceed.  History of Present Illness         David [name] is a 78 year old male for follow-up chronic conditions.  Follows with-plastic surgery, urology, orthopedics Oncology? Derm?  Past medical history-malignant melanoma, surgery June 12 on right foot  Dementia with behavior disturbance-follows with neurology--?? WHEN LAST APPOINTMENT? Diagnosed April 2025 with Alzheimer's disease. On lorazepam  and Seroquel  for sleep. Case management future placement?  Prostate cancer-follows with urology Dr. Roseann Patient experiences microscopic hematuria, balanitis, LUTS Meds: Tamsulosin  and Lorazepam   HTN Atenolol  50 mg twice daily, amlodipine  2.5 mg daily  HLD-atorvastatin  10 mg  Chronic rash? Was referred to dermatology as this cleared  Depression with anxiety Fluoxetine  (Prozac ) 20 mg twice daily  GERD-omeprazole  20 mg as needed  Chronic gout-no recent flare? Allopurinol  100 mg daily  Lyrica ???  Patient denies fever, chills, SOB, CP, palpitations, dyspnea, edema, HA, vision changes, N/V/D, abdominal pain, urinary symptoms, rash, weight changes, and recent illness or hospitalizations.   Health Maintenance Due  Topic Date Due   Zoster Vaccines- Shingrix (1 of 2) Never done   DTaP/Tdap/Td (2 - Td or Tdap) 09/06/2022   Influenza Vaccine  08/13/2023   COVID-19 Vaccine (7 - 2025-26 season) 09/13/2023    Past Medical History:  Diagnosis Date   Adenoma of right adrenal gland 07/11/2002   2.4cm , noted on CT ABD   Anxiety    Arthritis of both knees 03/26/2016   Astigmatism    Back pain    BCC (basal cell carcinoma of skin)    under right eye and right ear   Blood transfusion without reported diagnosis     Cataract 09/29/2016   Depression    Diverticulitis 2009   Diverticulosis    Family history of breast cancer    Family history of colon cancer    Fatty liver    GERD (gastroesophageal reflux disease)    Gout    Hearing loss of both ears 03/26/2016   History of atrial fibrillation    x 2 none since 2020   History of chronic prostatitis    started at age 30   History of kidney stones    Hx of adenomatous colonic polyps 02/16/2022   3 diminutive - no recall   Hyperglycemia    Hyperlipidemia    Hypertension    Incomplete right bundle branch block (RBBB)    Internal hemorrhoids    Kidney lesion 06/07/2015   Right midportion, 1.1x1.1 cm hyperechoic, noted on US  ABD   LAFB (left anterior fascicular block)    Lipoma of axilla 09/2016   Liver lesion, right lobe 06/07/2015   2.5x2.4x2.4 cm hypoechoic lesion posterior aspect, noted on US  ABD   Low back pain 03/26/2016   LVH (left ventricular hypertrophy) 08/06/2017   Moderate, Noted on ECHO   Medicare annual wellness visit, subsequent 03/12/2014   OA (osteoarthritis)    Back, Hands, Neck   Obesity 11/25/2007   Qualifier: Diagnosis of  By: Christi MD, Glendia HERO    Other malaise and fatigue 03/13/2013   Premature ventricular complex    Prostate cancer [facility]) dx 2021   Renal insufficiency    Kidney removed right   Scoliosis  Upper thoracic and lumbar   Sinus bradycardia 08/06/2017   Noted on EKG   Vitamin D  deficiency    resolved    Past Surgical History:  Procedure Laterality Date   CARDIOVERSION  07/31/2017   CATARACT EXTRACTION, BILATERAL     COLON SURGERY  2009   segmental sigmoid resection   COLONOSCOPY     COLONOSCOPY WITH PROPOFOL      CYSTOSCOPY N/A 07/28/2019   Procedure: CYSTOSCOPY FLEXIBLE;  Surgeon: Sherrilee Belvie CROME, MD;  Location: [facility];  Service: Urology;  Laterality: N/A;   CYSTOSCOPY W/ RETROGRADES Right 06/07/2018   Procedure: CYSTOSCOPY WITH RETROGRADE PYELOGRAM, uphrostogram;   Surgeon: Sherrilee Belvie CROME, MD;  Location: [facility];  Service: Urology;  Laterality: Right;   CYSTOSCOPY WITH URETEROSCOPY AND STENT PLACEMENT Right 02/28/2018   Procedure: CYSTOSCOPY WITH URETEROSCOPY ONNIE;  Surgeon: Ottelin, Mark, MD;  Location: [facility];  Service: Urology;  Laterality: Right;   CYSTOSCOPY/URETEROSCOPY/HOLMIUM LASER/STENT PLACEMENT Right 11/29/2017   Procedure: CYSTOSCOPY/RETROGRADE/URETEROSCOPY/HOLMIUM LASER/STENT PLACEMENT;  Surgeon: Ottelin, Mark, MD;  Location: [facility];  Service: Urology;  Laterality: Right;   EYE SURGERY Bilateral 01/12/2017   cataract removal   history of blood tranfusion  age 77   HOLMIUM LASER APPLICATION Right 09/24/2017   Procedure: HOLMIUM LASER APPLICATION;  Surgeon: Ottelin, Mark, MD;  Location: [facility];  Service: Urology;  Laterality: Right;   IR BALLOON DILATION URETERAL STRICTURE RIGHT  03/14/2018   IR NEPHRO TUBE REMOV/FL  03/17/2018   IR NEPHROSTOGRAM RIGHT THRU EXISTING ACCESS  03/17/2018   IR NEPHROSTOMY EXCHANGE RIGHT  03/14/2018   IR NEPHROSTOMY EXCHANGE RIGHT  06/21/2018   IR NEPHROSTOMY PLACEMENT RIGHT  03/02/2018   IR NEPHROSTOMY PLACEMENT RIGHT  04/20/2018   IR URETERAL STENT PLACEMENT EXISTING ACCESS RIGHT  03/14/2018   NOSE SURGERY     Submucous resection age 89   NUCLEAR STRESS TEST  06/03/2009   RADIOACTIVE SEED IMPLANT N/A 07/28/2019   Procedure: RADIOACTIVE SEED IMPLANT/BRACHYTHERAPY IMPLANT;  Surgeon: Sherrilee Belvie CROME, MD;  Location: [facility];  Service: Urology;  Laterality: N/A;  90 MINS   ROBOT ASSISTED LAPAROSCOPIC NEPHRECTOMY Right 08/04/2018   Procedure: XI ROBOTIC ASSISTED LAPAROSCOPIC NEPHRECTOMY;  Surgeon: Sherrilee Belvie CROME, MD;  Location: [facility];  Service: Urology;  Laterality: Right;  3 HRS   ROBOT ASSISTED PYELOPLASTY Right 06/07/2018   Procedure: attempted XI ROBOTIC ASSISTED PYELOPLASTY, lysis of adhesions;  Surgeon: Sherrilee Belvie CROME, MD;   Location: [facility];  Service: Urology;  Laterality: Right;  3 HRS   SKIN BIOPSY     SPACE OAR INSTILLATION N/A 07/28/2019   Procedure: SPACE OAR INSTILLATION;  Surgeon: Sherrilee Belvie CROME, MD;  Location: [facility];  Service: Urology;  Laterality: N/A;   URETEROSCOPY WITH HOLMIUM LASER LITHOTRIPSY Right 09/24/2017   Procedure: CYSTOSCOPY, URETEROSCOPY WITH HOLMIUM LASER LITHOTRIPSY, STENT PLACEMENT;  Surgeon: Ottelin, Mark, MD;  Location: [facility];  Service: Urology;  Laterality: Right;    Family History  Problem Relation Age of Onset   Heart disease Mother    Hypertension Mother    Stroke Mother    Colon cancer Mother 69   Breast cancer Mother 47   Heart disease Father        pacemaker   Aortic aneurysm Father    Hypertension Father    Lung cancer Father 38   Heart disease Sister    Atrial fibrillation Sister    Obesity Sister    Sleep  apnea Sister    Heart attack Brother    Other Brother        muscle disease   Arthritis Brother    Stroke Brother    Atrial fibrillation Brother    Heart attack Brother    Atrial fibrillation Brother    Diabetes Brother    Atrial fibrillation Brother    Heart attack Brother    Hypertension Brother    Hyperlipidemia Brother    Heart attack Brother    Other Brother        heart valve operation   Atrial fibrillation Brother    Heart attack Maternal Grandmother    Diabetes Maternal Grandmother    Cancer Maternal Grandfather 44       hodgin's lymphoma   Heart attack Paternal Grandmother    Anxiety disorder Paternal Grandmother    Pneumonia Paternal Grandfather    Liver cancer Nephew 2       great nephew; d. 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[employer]    Employer: [employer]  Tobacco Use   Smoking status: Former    Current packs/day: 0.00    Average packs/day: 1 pack/day for 6.0 years (6.0 ttl pk-yrs)    Types: Cigarettes    Start date: 01/11/1969    Quit date: 01/12/1969    Years since quitting: 54.7   Smokeless tobacco: Never   Tobacco comments:    1965-1971  Vaping Use   Vaping status: Never Used  Substance and Sexual Activity   Alcohol use: Not Currently    Comment: 2 whiskey drinks per day   Drug use: Never   Sexual activity: Yes  Other Topics Concern   Not on file  Social History Narrative   The patient is married for the second time, he has 3 sons.   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   2 alcoholic beverages most days.   1 caffeinated beverage daily   No drug use no current tobacco use he is a prior smoker   12/24/2016   Social Drivers of Health   Financial Resource Strain: Medium Risk (03/02/2023)   Overall Financial Resource Strain (CARDIA)    Difficulty of Paying Living Expenses: Somewhat hard  Food Insecurity: Low Risk  (06/10/2023)   Received from [facility]   Hunger Vital Sign    Within the past 12 months, you worried that your food would run out before you got money to buy more: Never true    Within the past 12 months, the food you bought just didn't last and you didn't have money to get more. : Never true  Transportation Needs: No Transportation Needs (06/10/2023)   Received from [facility]    In the past 12 months, has lack of reliable transportation kept you from medical appointments, meetings, work or from getting things needed for daily living? : No  Physical Activity: Inactive (04/05/2023)   Exercise Vital Sign    Days of Exercise per Week: 0  days    Minutes of Exercise per Session: 0 min  Stress: Stress Concern Present (04/05/2023)   [employer] of  Occupational Health - Occupational Stress Questionnaire    Feeling of Stress : To some extent  Social Connections: Moderately Isolated (03/02/2023)   Social Connection and Isolation Panel    Frequency of Communication with Friends and Family: More than three times a week    Frequency of Social Gatherings with Friends and Family: Twice a week    Attends Religious Services: Never    Database administrator or Organizations: No    Attends Engineer, structural: Not on file    Marital Status: Married  Catering manager Violence: Not At Risk (03/02/2023)   Humiliation, Afraid, Rape, and Kick questionnaire    Fear of Current or Ex-Partner: No    Emotionally Abused: No    Physically Abused: No    Sexually Abused: No    Outpatient Medications Prior to Visit  Medication Sig Dispense Refill   acetaminophen  (TYLENOL ) 325 MG tablet Take 2 tablets (650 mg total) by mouth every 6 (six) hours as needed. 36 tablet 0   allopurinol  (ZYLOPRIM ) 100 MG tablet Take 1 tablet (100 mg total) by mouth daily. 90 tablet 1   amLODipine  (NORVASC ) 2.5 MG tablet Take 1 tablet (2.5 mg total) by mouth daily. 90 tablet 1   apixaban  (ELIQUIS ) 5 MG TABS tablet Take 1 tablet (5 mg total) by mouth 2 (two) times daily. 180 tablet 3   ascorbic acid (VITAMIN C) 1000 MG tablet Take by mouth.     aspirin EC 81 MG tablet Take 81 mg by mouth daily.     atenolol  (TENORMIN ) 50 MG tablet TAKE 1 TABLET BY MOUTH 2 TIMES A DAY 180 tablet 1   atorvastatin  (LIPITOR) 10 MG tablet TAKE 1 TABLET BY MOUTH EVERY MORNING 90 tablet 1   B Complex Vitamins (VITAMIN B-COMPLEX) TABS Take 1 tablet by mouth daily.     Cholecalciferol (VITAMIN D -1000 MAX ST) 25 MCG (1000 UT) tablet Take by mouth.     clotrimazole -betamethasone  (LOTRISONE ) cream Apply 1 Application topically 2 (two) times daily. 45 g 2   colchicine  0.6 MG tablet For gout flares, take 1 tab po once then repeat in 1 hour as needed if pain persists up to a total of 3 doses 15 tablet 0    FLUoxetine  (PROZAC ) 20 MG tablet TAKE 1 TABLET BY MOUTH 2 TIMES A DAY 180 tablet 1   fluticasone  (FLONASE ) 50 MCG/ACT nasal spray Place 2 sprays into both nostrils daily. 16 g 1   ketoconazole  (NIZORAL ) 2 % cream APPLY A THIN LAYER TOPICALLY DAILY 60 g 2   LORazepam  (ATIVAN ) 1 MG tablet TAKE 1 TABLET BY MOUTH EVERY 8 HOURS AS NEEDED 90 tablet 0   naproxen  (NAPROSYN ) 375 MG tablet Take 375 mg by mouth.     omeprazole  (PRILOSEC) 20 MG capsule Take 20 mg by mouth daily as needed (acid reflux).      pregabalin  (LYRICA ) 25 MG capsule Take 1 capsule (25 mg total) by mouth 3 (three) times daily. 90 capsule 5   QUEtiapine  (SEROQUEL ) 25 MG tablet Take 2 tablets (50 mg total) by mouth at bedtime as needed. 60 tablet 11   tamsulosin  (FLOMAX ) 0.4 MG CAPS capsule Take 1 capsule (0.4 mg total) by mouth daily after supper. 90 capsule 0   tiZANidine  (ZANAFLEX ) 2 MG tablet Take 1-2 mg by mouth.     triamcinolone  (KENALOG ) 0.025 % cream  No facility-administered medications prior to visit.    Allergies  Allergen Reactions   Cefaclor Hives   Cephalosporins Hives   Gabapentin  Other (See Comments)   Penicillins Rash    Mild maculopapular rash Has patient had a PCN reaction causing immediate rash, facial/tongue/throat swelling, SOB or lightheadedness with hypotension: No Has patient had a PCN reaction causing severe rash involving mucus membranes or skin necrosis: No Has patient had a PCN reaction that required hospitalization: No Has patient had a PCN reaction occurring within the last 10 years: No If all of the above answers are NO, then may proceed with Cephalosporin use.     ROS See HPI    Objective:    Physical Exam General: No acute distress. Awake and conversant.  Eyes: Normal conjunctiva, anicteric. Round symmetric pupils.  ENT: Hearing grossly intact. No nasal discharge.  Neck: Neck is supple. No masses or thyromegaly.  Respiratory: CTAB. Respirations are non-labored. No wheezing.   Skin: Warm. No rashes or ulcers.  Psych: Alert and oriented. Cooperative, Appropriate mood and affect, Normal judgment.  CV: RRR. No murmur. No lower extremity edema.  MSK: Normal ambulation. No clubbing or cyanosis.  Neuro:  CN II-XII grossly normal.    There were no vitals taken for this visit. Wt Readings from Last 3 Encounters:  08/18/23 206 lb (93.4 kg)  07/28/23 203 lb (92.1 kg)  07/22/23 203 lb (92.1 kg)       Assessment & Plan:   Problem List Items Addressed This Visit     Dementia (HCC)   Continue to FU with neurology. Encourage participation in daily social activities and structured routines to support cognitive function. Recommend engaging in mentally stimulating activities such as puzzles, reading, or games. Promote physical activity as tolerated (e.g., walking) and ensure a safe home environment. Provide caregiver support and consider referral to community resources or memory care programs as needed.       Depression with anxiety   Stable. Denies SI/HI.      Essential hypertension, benign   Well controlled, no changes to meds. Encouraged heart healthy diet such as the DASH diet and exercise as tolerated.         Gout   Denies recent flares.  On allopurinol .  Hydrate and monitor.  Update uric acid level today.      Hyperglycemia   LAST hgba1c acceptable, minimize simple carbs. Increase exercise as tolerated.      Hyperlipidemia, mixed   Encourage heart healthy diet such as MIND or DASH diet, increase exercise, avoid trans fats, simple carbohydrates and processed foods, consider a krill or fish or flaxseed oil cap daily.         Prostate cancer [facility]) - Primary   Patient continues to follow with urology. PSA monitored by Urology.      Stage 3a chronic kidney disease (HCC)    I am having David KYM [name] maintain his omeprazole , ascorbic acid, colchicine , Vitamin D -1000 Max St, triamcinolone , fluticasone , acetaminophen , tamsulosin , ketoconazole ,  apixaban , atenolol , atorvastatin , tiZANidine , Vitamin B-Complex, naproxen , aspirin EC, amLODipine , clotrimazole -betamethasone , FLUoxetine , QUEtiapine , pregabalin , LORazepam , and allopurinol .  No orders of the defined types were placed in this encounter.

## 2023-10-15 NOTE — Assessment & Plan Note (Deleted)
 Patient continues to follow with urology. PSA monitored by Urology.

## 2023-10-15 NOTE — Assessment & Plan Note (Deleted)
 Continue to FU with neurology. Encourage participation in daily social activities and structured routines to support cognitive function. Recommend engaging in mentally stimulating activities such as puzzles, reading, or games. Promote physical activity as tolerated (e.g., walking) and ensure a safe home environment. Provide caregiver support and consider referral to community resources or memory care programs as needed.

## 2023-10-19 ENCOUNTER — Ambulatory Visit: Admitting: Student

## 2023-10-19 ENCOUNTER — Other Ambulatory Visit: Payer: Self-pay | Admitting: Cardiology

## 2023-10-19 DIAGNOSIS — N1831 Chronic kidney disease, stage 3a: Secondary | ICD-10-CM

## 2023-10-19 DIAGNOSIS — R739 Hyperglycemia, unspecified: Secondary | ICD-10-CM

## 2023-10-19 DIAGNOSIS — E782 Mixed hyperlipidemia: Secondary | ICD-10-CM

## 2023-10-19 DIAGNOSIS — C61 Malignant neoplasm of prostate: Secondary | ICD-10-CM

## 2023-10-19 DIAGNOSIS — F03918 Unspecified dementia, unspecified severity, with other behavioral disturbance: Secondary | ICD-10-CM

## 2023-10-19 DIAGNOSIS — M1A9XX0 Chronic gout, unspecified, without tophus (tophi): Secondary | ICD-10-CM

## 2023-10-19 DIAGNOSIS — I480 Paroxysmal atrial fibrillation: Secondary | ICD-10-CM

## 2023-10-19 DIAGNOSIS — I10 Essential (primary) hypertension: Secondary | ICD-10-CM

## 2023-10-19 DIAGNOSIS — F418 Other specified anxiety disorders: Secondary | ICD-10-CM

## 2023-10-19 NOTE — Telephone Encounter (Signed)
 Prescription refill request for Eliquis  received. Indication:afib Last office visit:3/25 Scr:1.46  7/25 Age: 78 Weight:93.4  kg  Prescription refilled

## 2023-10-25 ENCOUNTER — Other Ambulatory Visit: Payer: Self-pay | Admitting: Family Medicine

## 2023-10-28 NOTE — Progress Notes (Signed)
 Subjective:     Patient ID: David [name], male    DOB: 1945-09-15, 78 y.o.   MRN: [id number]  No chief complaint on file.   HPI  Discussed the use of AI scribe software for clinical note transcription with the patient, who gave verbal consent to proceed.  History of Present Illness [name] is a 78 year old male with dementia who presents for a follow-up.  He is accompanied by his son. He lives at home with his wife, Ginger. Ginger  assists with his medication management.  He experiences sleep disturbances with sleepless nights and an irregular sleep cycle. Despite medication, he sometimes has difficulty sleeping, affecting his daily routine. He missed his medication last night, resulting in waking up at 4:30 PM and missing his morning medications. His wife assists with his medication, but his schedules sometimes conflict, impacting adherence. He takes quetiapine  50 mg HS, and Prozac  daily for mood, which is stable. His behavior and mood differ significantly on days following sleepless nights compared to when he has adequate sleep.  He has a history of foot issues, including two broken bones in his big toe and previous surgery for skin cancer removal from the bottom of his foot. He uses a walker at home and recently completed physical therapy to assist with mobility and activities of daily living, such as showering with a chair. Denies falls in the home.  He experiences a recurrent rash, balanitis which has been a long-standing issue. Follows with Urology. He wears briefs sometimes.   Follows with cardiology, urology  Hypertension-amlodipine  2.5 mg, atenolol  50 mg BP at home  HLD-atorvastatin  10 mg daily  Anxiety and depression- fluoxetine  (Prozac ) 20 mg twice daily Lorazepam  1 mg every 8 hour Quetiapine  50 mg at bedtime   Patient denies fever, chills, SOB, CP, palpitations, dyspnea, edema, HA, vision changes, N/V/D, abdominal pain, urinary symptoms, rash, weight  changes, and recent illness or hospitalizations.   History of Present Illness              Health Maintenance Due  Topic Date Due   Zoster Vaccines- Shingrix (1 of 2) Never done   DTaP/Tdap/Td (2 - Td or Tdap) 09/06/2022   COVID-19 Vaccine (7 - 2025-26 season) 09/13/2023    Past Medical History:  Diagnosis Date   Adenoma of right adrenal gland 07/11/2002   2.4cm , noted on CT ABD   Anxiety    Arthritis of both knees 03/26/2016   Astigmatism    Back pain    BCC (basal cell carcinoma of skin)    under right eye and right ear   Blood transfusion without reported diagnosis    Cataract 09/29/2016   Depression    Diverticulitis 2009   Diverticulosis    Family history of breast cancer    Family history of colon cancer    Fatty liver    GERD (gastroesophageal reflux disease)    Gout    Hearing loss of both ears 03/26/2016   History of atrial fibrillation    x 2 none since 2020   History of chronic prostatitis    started at age 3   History of kidney stones    Hx of adenomatous colonic polyps 02/16/2022   3 diminutive - no recall   Hyperglycemia    Hyperlipidemia    Hypertension    Incomplete right bundle branch block (RBBB)    Internal hemorrhoids    Kidney lesion 06/07/2015   Right midportion, 1.1x1.1 cm hyperechoic,  noted on US  ABD   LAFB (left anterior fascicular block)    Lipoma of axilla 09/2016   Liver lesion, right lobe 06/07/2015   2.5x2.4x2.4 cm hypoechoic lesion posterior aspect, noted on US  ABD   Low back pain 03/26/2016   LVH (left ventricular hypertrophy) 08/06/2017   Moderate, Noted on ECHO   Medicare annual wellness visit, subsequent 03/12/2014   OA (osteoarthritis)    Back, Hands, Neck   Obesity 11/25/2007   Qualifier: Diagnosis of  By: Christi MD, Glendia HERO    Other malaise and fatigue 03/13/2013   Premature ventricular complex    Prostate cancer (HCC) dx 2021   Renal insufficiency    Kidney removed right   Scoliosis    Upper thoracic and  lumbar   Sinus bradycardia 08/06/2017   Noted on EKG   Vitamin D  deficiency    resolved    Past Surgical History:  Procedure Laterality Date   CARDIOVERSION  07/31/2017   CATARACT EXTRACTION, BILATERAL     COLON SURGERY  2009   segmental sigmoid resection   COLONOSCOPY     COLONOSCOPY WITH PROPOFOL      CYSTOSCOPY N/A 07/28/2019   Procedure: CYSTOSCOPY FLEXIBLE;  Surgeon: Sherrilee Belvie CROME, MD;  Location: [facility];  Service: Urology;  Laterality: N/A;   CYSTOSCOPY W/ RETROGRADES Right 06/07/2018   Procedure: CYSTOSCOPY WITH RETROGRADE PYELOGRAM, uphrostogram;  Surgeon: Sherrilee Belvie CROME, MD;  Location: [facility];  Service: Urology;  Laterality: Right;   CYSTOSCOPY WITH URETEROSCOPY AND STENT PLACEMENT Right 02/28/2018   Procedure: CYSTOSCOPY WITH URETEROSCOPY ONNIE;  Surgeon: Ottelin, Mark, MD;  Location[facility];  Service: Urology;  Laterality: Right;   CYSTOSCOPY/URETEROSCOPY/HOLMIUM LASER/STENT PLACEMENT Right 11/29/2017   Procedure: CYSTOSCOPY/RETROGRADE/URETEROSCOPY/HOLMIUM LASER/STENT PLACEMENT;  Surgeon: Ottelin, Mark, MD;  Location: [facility];  Service: Urology;  Laterality: Right;   EYE SURGERY Bilateral 01/12/2017   cataract removal   history of blood tranfusion  age 62   HOLMIUM LASER APPLICATION Right 09/24/2017   Procedure: HOLMIUM LASER APPLICATION;  Surgeon: Ottelin, Mark, MD;  Location: [facility];  Service: Urology;  Laterality: Right;   IR BALLOON DILATION URETERAL STRICTURE RIGHT  03/14/2018   IR NEPHRO TUBE REMOV/FL  03/17/2018   IR NEPHROSTOGRAM RIGHT THRU EXISTING ACCESS  03/17/2018   IR NEPHROSTOMY EXCHANGE RIGHT  03/14/2018   IR NEPHROSTOMY EXCHANGE RIGHT  06/21/2018   IR NEPHROSTOMY PLACEMENT RIGHT  03/02/2018   IR NEPHROSTOMY PLACEMENT RIGHT  04/20/2018   IR URETERAL STENT PLACEMENT EXISTING ACCESS RIGHT  03/14/2018   NOSE SURGERY     Submucous resection age 43   NUCLEAR STRESS TEST  06/03/2009    RADIOACTIVE SEED IMPLANT N/A 07/28/2019   Procedure: RADIOACTIVE SEED IMPLANT/BRACHYTHERAPY IMPLANT;  Surgeon: Sherrilee Belvie CROME, MD;  Location: [facility];  Service: Urology;  Laterality: N/A;  90 MINS   ROBOT ASSISTED LAPAROSCOPIC NEPHRECTOMY Right 08/04/2018   Procedure: XI ROBOTIC ASSISTED LAPAROSCOPIC NEPHRECTOMY;  Surgeon: Sherrilee Belvie CROME, MD;  Location: [facility];  Service: Urology;  Laterality: Right;  3 HRS   ROBOT ASSISTED PYELOPLASTY Right 06/07/2018   Procedure: attempted XI ROBOTIC ASSISTED PYELOPLASTY, lysis of adhesions;  Surgeon: Sherrilee Belvie CROME, MD;  Location: [facility];  Service: Urology;  Laterality: Right;  3 HRS   SKIN BIOPSY     SPACE OAR INSTILLATION N/A 07/28/2019   Procedure: SPACE OAR INSTILLATION;  Surgeon: Sherrilee Belvie CROME, MD;  Location: [facility];  Service: Urology;  Laterality: N/A;  URETEROSCOPY WITH HOLMIUM LASER LITHOTRIPSY Right 09/24/2017   Procedure: CYSTOSCOPY, URETEROSCOPY WITH HOLMIUM LASER LITHOTRIPSY, STENT PLACEMENT;  Surgeon: Ottelin, Mark, MD;  Location: [facility];  Service: Urology;  Laterality: Right;    Family History  Problem Relation Age of Onset   Heart disease Mother    Hypertension Mother    Stroke Mother    Colon cancer Mother 98   Breast cancer Mother 45   Heart disease Father        pacemaker   Aortic aneurysm Father    Hypertension Father    Lung cancer Father 52   Heart disease Sister    Atrial fibrillation Sister    Obesity Sister    Sleep apnea Sister    Heart attack Brother    Other Brother        muscle disease   Arthritis Brother    Stroke Brother    Atrial fibrillation Brother    Heart attack Brother    Atrial fibrillation Brother    Diabetes Brother    Atrial fibrillation Brother    Heart attack Brother    Hypertension Brother    Hyperlipidemia Brother    Heart attack Brother    Other Brother        heart valve operation   Atrial fibrillation Brother     Heart attack Maternal Grandmother    Diabetes Maternal Grandmother    Cancer Maternal Grandfather 44       hodgin's lymphoma   Heart attack Paternal Grandmother    Anxiety disorder Paternal Grandmother    Pneumonia Paternal Grandfather    Liver cancer Nephew 2       great nephew; d. 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[employer]    Employer: [employer]  Tobacco Use   Smoking status: Former    Current packs/day: 0.00    Average packs/day: 1 pack/day for 6.0 years (6.0 ttl pk-yrs)    Types: Cigarettes    Start date: 01/11/1969    Quit date: 01/12/1969    Years since quitting: 54.8   Smokeless tobacco: Never   Tobacco comments:    1965-1971  Vaping Use   Vaping status: Never Used  Substance and Sexual Activity   Alcohol use: Not Currently    Comment: 2 whiskey drinks per day   Drug use: Never   Sexual activity: Yes  Other Topics Concern   Not on file  Social History Narrative   The patient is married for the second time, he has 3 sons.   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   2 alcoholic beverages most days.   1 caffeinated beverage daily   No drug use no current tobacco use he is a prior smoker   12/24/2016   Social Drivers of Health   Financial Resource Strain: Low Risk  (10/29/2023)   Overall Financial Resource Strain (CARDIA)    Difficulty of Paying Living Expenses: Not very hard  Food Insecurity: No Food Insecurity (10/29/2023)  Hunger Vital Sign    Worried About Running Out of Food in the Last Year: Never true    Ran Out of Food in the Last Year: Never true  Transportation Needs: No  Transportation Needs (10/29/2023)   PRAPARE - Administrator, Civil Service (Medical): No    Lack of Transportation (Non-Medical): No  Physical Activity: Insufficiently Active (10/29/2023)   Exercise Vital Sign    Days of Exercise per Week: 1 day    Minutes of Exercise per Session: 10 min  Stress: Stress Concern Present (10/29/2023)   [employer] of Occupational Health - Occupational Stress Questionnaire    Feeling of Stress: Very much  Social Connections: Moderately Integrated (10/29/2023)   Social Connection and Isolation Panel    Frequency of Communication with Friends and Family: Twice a week    Frequency of Social Gatherings with Friends and Family: Twice a week    Attends Religious Services: 1 to 4 times per year    Active Member of [employer] or Organizations: No    Attends Engineer, structural: Not on file    Marital Status: Married  Catering manager Violence: Not At Risk (03/02/2023)   Humiliation, Afraid, Rape, and Kick questionnaire    Fear of Current or Ex-Partner: No    Emotionally Abused: No    Physically Abused: No    Sexually Abused: No    Outpatient Medications Prior to Visit  Medication Sig Dispense Refill   acetaminophen  (TYLENOL ) 325 MG tablet Take 2 tablets (650 mg total) by mouth every 6 (six) hours as needed. 36 tablet 0   allopurinol  (ZYLOPRIM ) 100 MG tablet Take 1 tablet (100 mg total) by mouth daily. 90 tablet 1   amLODipine  (NORVASC ) 2.5 MG tablet Take 1 tablet (2.5 mg total) by mouth daily. 90 tablet 1   apixaban  (ELIQUIS ) 5 MG TABS tablet TAKE 1 TABLET BY MOUTH 2 TIMES A DAY 60 tablet 5   ascorbic acid (VITAMIN C) 1000 MG tablet Take by mouth.     aspirin EC 81 MG tablet Take 81 mg by mouth daily.     atenolol  (TENORMIN ) 50 MG tablet TAKE 1 TABLET BY MOUTH 2 TIMES A DAY 180 tablet 1   atorvastatin  (LIPITOR) 10 MG tablet TAKE 1 TABLET BY MOUTH EVERY MORNING 90 tablet 1   B Complex Vitamins (VITAMIN B-COMPLEX) TABS Take 1 tablet by  mouth daily.     Cholecalciferol (VITAMIN D -1000 MAX ST) 25 MCG (1000 UT) tablet Take by mouth.     colchicine  0.6 MG tablet For gout flares, take 1 tab po once then repeat in 1 hour as needed if pain persists up to a total of 3 doses 15 tablet 0   FLUoxetine  (PROZAC ) 20 MG tablet TAKE 1 TABLET BY MOUTH 2 TIMES A DAY 180 tablet 1   fluticasone  (FLONASE ) 50 MCG/ACT nasal spray Place 2 sprays into both nostrils daily. 16 g 1   ketoconazole  (NIZORAL ) 2 % cream APPLY A THIN LAYER TOPICALLY DAILY 60 g 2   LORazepam  (ATIVAN ) 1 MG tablet TAKE 1 TABLET BY MOUTH EVERY 8 HOURS AS NEEDED 90 tablet 0   naproxen  (NAPROSYN ) 375 MG tablet Take 375 mg by mouth.     omeprazole  (PRILOSEC) 20 MG capsule Take 20 mg by mouth daily as needed (acid reflux).      pregabalin  (LYRICA ) 25 MG capsule Take 1 capsule (25 mg total) by mouth 3 (three) times daily. 90 capsule 5   QUEtiapine  (SEROQUEL ) 25  MG tablet Take 2 tablets (50 mg total) by mouth at bedtime as needed. 60 tablet 11   tamsulosin  (FLOMAX ) 0.4 MG CAPS capsule Take 1 capsule (0.4 mg total) by mouth daily after supper. 90 capsule 0   tiZANidine  (ZANAFLEX ) 2 MG tablet Take 1-2 mg by mouth.     triamcinolone  (KENALOG ) 0.025 % cream      clotrimazole -betamethasone  (LOTRISONE ) cream Apply 1 Application topically 2 (two) times daily. 45 g 2   No facility-administered medications prior to visit.    Allergies  Allergen Reactions   Cefaclor Hives   Cephalosporins Hives   Gabapentin  Other (See Comments)   Penicillins Rash    Mild maculopapular rash Has patient had a PCN reaction causing immediate rash, facial/tongue/throat swelling, SOB or lightheadedness with hypotension: No Has patient had a PCN reaction causing severe rash involving mucus membranes or skin necrosis: No Has patient had a PCN reaction that required hospitalization: No Has patient had a PCN reaction occurring within the last 10 years: No If all of the above answers are NO, then may proceed  with Cephalosporin use.     ROS     Objective:    Physical Exam  General: No acute distress. Awake and conversant.  Eyes: Normal conjunctiva, anicteric. Round symmetric pupils.  Neck: Neck is supple. No masses or thyromegaly.  Respiratory: CTAB. Respirations are non-labored. No wheezing.  Psych: Cooperative, Confused.  CV: No murmur. No lower extremity edema.  MSK: in wheelchair, reports walks with walker at home.  Neuro:  CN II-XII grossly normal.   BP 115/61   Pulse 62   Temp 97.9 F (36.6 C)   Ht 5' 11 (1.803 m)   SpO2 98%   BMI 28.73 kg/m  Wt Readings from Last 3 Encounters:  08/18/23 206 lb (93.4 kg)  07/28/23 203 lb (92.1 kg)  07/22/23 203 lb (92.1 kg)       Assessment & Plan:   Problem List Items Addressed This Visit     Balanitis   Follows with Urology. Recurrent Balanitis. - Refill prescribed topical cream. - Advise keeping affected areas clean and dry. - Change incontinence briefs regularly.      Relevant Medications   clotrimazole -betamethasone  (LOTRISONE ) cream   CRI (chronic renal insufficiency) - Primary   Hydrate and Monitor      Dementia (HCC)   Dementia with behavioral disturbance and insomnia Diagnosed in April 2025. Dementia with sleep disturbances and behavioral changes. Follows with NeuroPsych - Continue quetiapine  50 mg HS - Maintain consistent sleep and medication schedule. - Recommend discussing sleep disturbances with neurologist in August.   - Avoid abrupt lorazepam  discontinuation; consider tapering.      Essential hypertension, benign   Hypertension controlled with amlodipine  and atenolol . Home blood pressure readings are good. - Monitor and record home blood pressure. - Consider reducing amlodipine  if consistently low.      Relevant Orders   CBC with Differential/Platelet   Comprehensive metabolic panel with GFR   TSH   Foot pain, bilateral   Gout   Update Uric acid level. Had recent gout flare, now resolved.  Consider dose increase if uric acid above target.      Relevant Orders   Uric acid   History of atrial fibrillation   Rate controlled. Follows with Cardiology. On Eliques      Hyperglycemia   hgba1c acceptable, minimize simple carbs. Increase exercise as tolerated.       Hyperlipidemia, mixed   Relevant Orders   Lipid panel  Other Visit Diagnoses       Need for influenza vaccination       Relevant Orders   Flu vaccine HIGH DOSE PF(Fluzone Trivalent) (Completed)      Chronic lower back and b/l leg pain - Encouraged  proper foot support, compression socks - Recommended foot elevation above heart 10-15 minutes, three times daily. - Advised hydration with 80 ounces of water  daily. - Suggested protein intake every 3-4 hours, multivitamin with minerals.  I am having David KYM [name] maintain his omeprazole , ascorbic acid, colchicine , Vitamin D -1000 Max St, triamcinolone , fluticasone , acetaminophen , tamsulosin , ketoconazole , atorvastatin , tiZANidine , Vitamin B-Complex, naproxen , aspirin EC, amLODipine , FLUoxetine , QUEtiapine , pregabalin , LORazepam , allopurinol , Eliquis , atenolol , and clotrimazole -betamethasone .  Meds ordered this encounter  Medications   clotrimazole -betamethasone  (LOTRISONE ) cream    Sig: Apply 1 Application topically 2 (two) times daily.    Dispense:  45 g    Refill:  1    Supervising Provider:   DOMENICA BLACKBIRD A [[id number]]

## 2023-10-29 ENCOUNTER — Other Ambulatory Visit (HOSPITAL_BASED_OUTPATIENT_CLINIC_OR_DEPARTMENT_OTHER): Payer: Self-pay

## 2023-10-29 ENCOUNTER — Ambulatory Visit: Admitting: Student

## 2023-10-29 VITALS — BP 115/61 | HR 62 | Temp 97.9°F | Ht 71.0 in

## 2023-10-29 DIAGNOSIS — M79672 Pain in left foot: Secondary | ICD-10-CM

## 2023-10-29 DIAGNOSIS — E782 Mixed hyperlipidemia: Secondary | ICD-10-CM | POA: Diagnosis not present

## 2023-10-29 DIAGNOSIS — M79671 Pain in right foot: Secondary | ICD-10-CM | POA: Diagnosis not present

## 2023-10-29 DIAGNOSIS — N481 Balanitis: Secondary | ICD-10-CM | POA: Insufficient documentation

## 2023-10-29 DIAGNOSIS — M1A9XX0 Chronic gout, unspecified, without tophus (tophi): Secondary | ICD-10-CM | POA: Diagnosis not present

## 2023-10-29 DIAGNOSIS — Z8679 Personal history of other diseases of the circulatory system: Secondary | ICD-10-CM

## 2023-10-29 DIAGNOSIS — R739 Hyperglycemia, unspecified: Secondary | ICD-10-CM | POA: Diagnosis not present

## 2023-10-29 DIAGNOSIS — I10 Essential (primary) hypertension: Secondary | ICD-10-CM

## 2023-10-29 DIAGNOSIS — Z23 Encounter for immunization: Secondary | ICD-10-CM

## 2023-10-29 DIAGNOSIS — N289 Disorder of kidney and ureter, unspecified: Secondary | ICD-10-CM

## 2023-10-29 DIAGNOSIS — F03918 Unspecified dementia, unspecified severity, with other behavioral disturbance: Secondary | ICD-10-CM

## 2023-10-29 MED ORDER — CLOTRIMAZOLE-BETAMETHASONE 1-0.05 % EX CREA: 1.0000 | TOPICAL_CREAM | Freq: Two times a day (BID) | CUTANEOUS | 1 refills | Status: AC

## 2023-10-29 MED ORDER — COMIRNATY 30 MCG/0.3ML IM SUSY
0.3000 mL | PREFILLED_SYRINGE | Freq: Once | INTRAMUSCULAR | 0 refills | Status: AC
  Filled 2023-10-29: qty 0.3, 1d supply, fill #0

## 2023-10-29 NOTE — Assessment & Plan Note (Signed)
 Update Uric acid level. Had recent gout flare, now resolved. Consider dose increase if uric acid above target.

## 2023-10-29 NOTE — Assessment & Plan Note (Signed)
 Hypertension controlled with amlodipine  and atenolol . Home blood pressure readings are good. - Monitor and record home blood pressure. - Consider reducing amlodipine  if consistently low.

## 2023-10-29 NOTE — Addendum Note (Signed)
 Addended by: ESTELLE GILLIS D on: 10/29/2023 10:27 AM   Modules accepted: Orders

## 2023-10-29 NOTE — Assessment & Plan Note (Addendum)
 Dementia with behavioral disturbance and insomnia Diagnosed in April 2025. Dementia with sleep disturbances and behavioral changes. Follows with NeuroPsych - Continue quetiapine  50 mg HS - Maintain consistent sleep and medication schedule. - Recommend discussing sleep disturbances with neurologist in August.   - Avoid abrupt lorazepam  discontinuation; consider tapering.

## 2023-10-29 NOTE — Assessment & Plan Note (Signed)
 Rate controlled. Follows with Cardiology. On Eliques

## 2023-10-29 NOTE — Assessment & Plan Note (Signed)
 hgba1c acceptable, minimize simple carbs. Increase exercise as tolerated.

## 2023-10-29 NOTE — Assessment & Plan Note (Signed)
 Hydrate and Monitor.

## 2023-10-29 NOTE — Assessment & Plan Note (Signed)
 Follows with Urology. Recurrent Balanitis. - Refill prescribed topical cream. - Advise keeping affected areas clean and dry. - Change incontinence briefs regularly.

## 2023-10-30 LAB — COMPREHENSIVE METABOLIC PANEL WITH GFR
AG Ratio: 1.7 (calc) (ref 1.0–2.5)
ALT: 13 U/L (ref 9–46)
AST: 15 U/L (ref 10–35)
Albumin: 4 g/dL (ref 3.6–5.1)
Alkaline phosphatase (APISO): 77 U/L (ref 35–144)
BUN/Creatinine Ratio: 15 (calc) (ref 6–22)
BUN: 24 mg/dL (ref 7–25)
CO2: 27 mmol/L (ref 20–32)
Calcium: 10.8 mg/dL — ABNORMAL HIGH (ref 8.6–10.3)
Chloride: 105 mmol/L (ref 98–110)
Creat: 1.58 mg/dL — ABNORMAL HIGH (ref 0.70–1.28)
Globulin: 2.3 g/dL (ref 1.9–3.7)
Glucose, Bld: 100 mg/dL — ABNORMAL HIGH (ref 65–99)
Potassium: 4.1 mmol/L (ref 3.5–5.3)
Sodium: 140 mmol/L (ref 135–146)
Total Bilirubin: 0.4 mg/dL (ref 0.2–1.2)
Total Protein: 6.3 g/dL (ref 6.1–8.1)
eGFR: 44 mL/min/1.73m2 — ABNORMAL LOW (ref >=60)

## 2023-10-30 LAB — CBC WITH DIFFERENTIAL/PLATELET
Absolute Lymphocytes: 877 {cells}/uL (ref 850–3900)
Absolute Monocytes: 495 {cells}/uL (ref 200–950)
Basophils Absolute: 31 {cells}/uL (ref 0–200)
Basophils Relative: 0.6 %
Eosinophils Absolute: 332 {cells}/uL (ref 15–500)
Eosinophils Relative: 6.5 %
HCT: 34.9 % — ABNORMAL LOW (ref 38.5–50.0)
Hemoglobin: 11.4 g/dL — ABNORMAL LOW (ref 13.2–17.1)
MCH: 30.9 pg (ref 27.0–33.0)
MCHC: 32.7 g/dL (ref 32.0–36.0)
MCV: 94.6 fL (ref 80.0–100.0)
MPV: 11.5 fL (ref 7.5–12.5)
Monocytes Relative: 9.7 %
Neutro Abs: 3366 {cells}/uL (ref 1500–7800)
Neutrophils Relative %: 66 %
Platelets: 188 Thousand/uL (ref 140–400)
RBC: 3.69 Million/uL — ABNORMAL LOW (ref 4.20–5.80)
RDW: 13.2 % (ref 11.0–15.0)
Total Lymphocyte: 17.2 %
WBC: 5.1 Thousand/uL (ref 3.8–10.8)

## 2023-10-30 LAB — LIPID PANEL
Cholesterol: 143 mg/dL (ref <200)
HDL: 44 mg/dL (ref >=40)
LDL Cholesterol (Calc): 75 mg/dL
Non-HDL Cholesterol (Calc): 99 mg/dL (ref <130)
Total CHOL/HDL Ratio: 3.3 (calc) (ref <5.0)
Triglycerides: 162 mg/dL — ABNORMAL HIGH (ref <150)

## 2023-10-30 LAB — TSH: TSH: 4.98 m[IU]/L — ABNORMAL HIGH (ref 0.40–4.50)

## 2023-10-30 LAB — URIC ACID: Uric Acid, Serum: 5.3 mg/dL (ref 4.0–8.0)

## 2023-11-01 ENCOUNTER — Ambulatory Visit: Payer: Self-pay | Admitting: Student

## 2023-11-01 DIAGNOSIS — R7989 Other specified abnormal findings of blood chemistry: Secondary | ICD-10-CM

## 2023-11-03 ENCOUNTER — Other Ambulatory Visit: Payer: Self-pay

## 2023-11-03 MED ORDER — LORAZEPAM 1 MG PO TABS: 1.0000 mg | ORAL_TABLET | Freq: Three times a day (TID) | ORAL | 0 refills | Status: DC | PRN

## 2023-11-03 NOTE — Telephone Encounter (Signed)
 Requesting: lorazepam  1mg  Contract:12/08/22 UDS: 12/08/22 Last Visit: 10/29/23 w/ Wheeler Next Visit: 03/02/24 Last Refill: 09/16/23 #90 and 0RF   Please Advise

## 2023-11-14 ENCOUNTER — Other Ambulatory Visit: Payer: Self-pay | Admitting: Family Medicine

## 2023-11-14 DIAGNOSIS — M1A9XX0 Chronic gout, unspecified, without tophus (tophi): Secondary | ICD-10-CM

## 2023-11-15 ENCOUNTER — Ambulatory Visit: Admitting: Family Medicine

## 2023-11-15 ENCOUNTER — Encounter: Payer: Self-pay | Admitting: Radiology

## 2023-12-04 ENCOUNTER — Other Ambulatory Visit: Payer: Self-pay | Admitting: Family Medicine

## 2023-12-20 ENCOUNTER — Other Ambulatory Visit: Payer: Self-pay | Admitting: Family

## 2023-12-20 NOTE — Telephone Encounter (Signed)
 Requesting: lorazepam  1mg  Contract:01/20/23 UDS:12/08/22 Last Visit: 10/29/23 w/ Harlene GRADE Next Visit: 03/02/24 Last Refill: 11/03/23 #90 and 0RF   Please Advise

## 2023-12-29 ENCOUNTER — Other Ambulatory Visit

## 2024-01-10 ENCOUNTER — Other Ambulatory Visit

## 2024-01-10 DIAGNOSIS — R946 Abnormal results of thyroid function studies: Secondary | ICD-10-CM

## 2024-01-10 DIAGNOSIS — R7989 Other specified abnormal findings of blood chemistry: Secondary | ICD-10-CM

## 2024-01-10 LAB — T4, FREE: Free T4: 0.7 ng/dL (ref 0.60–1.60)

## 2024-01-10 LAB — TSH: TSH: 2.78 u[IU]/mL (ref 0.35–5.50)

## 2024-01-11 ENCOUNTER — Ambulatory Visit: Payer: Self-pay | Admitting: Student

## 2024-01-23 ENCOUNTER — Other Ambulatory Visit: Payer: Self-pay | Admitting: Family Medicine

## 2024-01-26 ENCOUNTER — Ambulatory Visit (INDEPENDENT_AMBULATORY_CARE_PROVIDER_SITE_OTHER): Admitting: Urology

## 2024-01-26 ENCOUNTER — Encounter: Payer: Self-pay | Admitting: Urology

## 2024-01-26 VITALS — BP 106/57 | HR 50 | Ht 70.0 in | Wt 203.0 lb

## 2024-01-26 DIAGNOSIS — Z79818 Long term (current) use of other agents affecting estrogen receptors and estrogen levels: Secondary | ICD-10-CM

## 2024-01-26 DIAGNOSIS — R399 Unspecified symptoms and signs involving the genitourinary system: Secondary | ICD-10-CM | POA: Diagnosis not present

## 2024-01-26 DIAGNOSIS — Z8546 Personal history of malignant neoplasm of prostate: Secondary | ICD-10-CM | POA: Diagnosis not present

## 2024-01-26 DIAGNOSIS — C61 Malignant neoplasm of prostate: Secondary | ICD-10-CM

## 2024-01-26 LAB — URINALYSIS, ROUTINE W REFLEX MICROSCOPIC
Bilirubin, UA: NEGATIVE
Glucose, UA: NEGATIVE
Ketones, UA: NEGATIVE
Leukocytes,UA: NEGATIVE
Nitrite, UA: NEGATIVE
Protein,UA: NEGATIVE
RBC, UA: NEGATIVE
Specific Gravity, UA: 1.01 (ref 1.005–1.030)
Urobilinogen, Ur: 0.2 mg/dL (ref 0.2–1.0)
pH, UA: 5.5 (ref 5.0–7.5)

## 2024-01-26 MED ORDER — LEUPROLIDE ACETATE (6 MONTH) 45 MG ~~LOC~~ KIT
45.0000 mg | PACK | Freq: Once | SUBCUTANEOUS | Status: AC
  Administered 2024-01-26: 45 mg via SUBCUTANEOUS

## 2024-01-26 NOTE — Progress Notes (Signed)
 " Assessment: 1. Prostate cancer (HCC); recurrent after brachytherapy; s/p EBRT + LT-ADT   2. Encounter for monitoring androgen deprivation therapy   3. Lower urinary tract symptoms (LUTS)     Plan: Eligard  37-month depot given today - this will complete 24 month of ADT. PSA today Continue tamsulosin . Return to office in 6 months  Chief Complaint: Chief Complaint  Patient presents with   Prostate Cancer    HPI: David [name] is a 79 y.o. male who presents for continued evaluation of recurrent prostate cancer, microscopic hematuria, lower urinary tract symptoms, and balanitis. He has previously been followed by Dr. Shona, last in March 2025.  Recurrent Prostate cancer: Patient has a history of prostate cancer initially diagnosed 03/2019--Gleason 3+4 =7 with pretreatment PSA of 5.53. Patient underwent LDR implant 07/2019. Nadir PSA was 2.46 January 2022.  His PSA continued to rise and a PSMA PET scan 10/2020 showed prostatic uptake only.  Biopsy however was negative at that time.  Follow-up PSA rose again to 4.9 in 12/2021.  Repeat PET scan at that time showed persistent tumor in the prostate and on biopsy Gleason 4+4 disease was present in all 12 cores. PSA at that time 7.1   05/2022.   Patient started on ADT with Eligard  07/15/2022-- with plan for minimum of 18 months/preferably 24 months Completed salvage RT 11/08/2022. He received Eligard  on 02/03/23. He received a 28-month Eligard  on 07/22/2023.  PSA 1/25:  <0.1 PSA 7/25: <0.1  Microscopic Hematuria: He has a history of microscopic hematuria. CT without contrast from 10/24 did not demonstrate any significant upper tract abnormality. Cystoscopy from 3/25 showed mild lateral lobe enlargement of the prostate with some friability along the prostatic urethra, bladder without mucosal lesions. Hematuria profile from 3/25 was negative for malignant cells or dysplasia; isomorphic erythrocytes indicating lower urinary tract/uroepithelial  bleeding noted. U/A from 07/14/23:  negative  Recurrent  Balanitis: He has a history of recurrent balanitis which has responded to Lotrisone  cream.   No recent episodes of balanitis  LUTS: He has a history of lower urinary tract symptoms.  He has been managed with tamsulosin . His lower urinary tract symptoms remain fairly stable.  No dysuria or gross hematuria. IPSS = 9/3. He continues on tamsulosin  0.4 mg daily.  His lower urinary tract symptoms are stable.  He continues to have some urgency and decreased stream.  No dysuria or gross hematuria. IPSS = 12/4.  Patient has a long history of recurrent stone disease complicated by development of right ureteral stricture and nonfunctioning right kidney status post robotic assisted laparoscopic nephrectomy in 2020.   Portions of the above documentation were copied from a prior visit for review purposes only.  Allergies: Allergies  Allergen Reactions   Cefaclor Hives   Cephalosporins Hives   Gabapentin  Other (See Comments)   Penicillins Rash    Mild maculopapular rash Has patient had a PCN reaction causing immediate rash, facial/tongue/throat swelling, SOB or lightheadedness with hypotension: No Has patient had a PCN reaction causing severe rash involving mucus membranes or skin necrosis: No Has patient had a PCN reaction that required hospitalization: No Has patient had a PCN reaction occurring within the last 10 years: No If all of the above answers are NO, then may proceed with Cephalosporin use.     PMH: Past Medical History:  Diagnosis Date   Adenoma of right adrenal gland 07/11/2002   2.4cm , noted on CT ABD   Anxiety    Arthritis of both knees 03/26/2016  Astigmatism    Back pain    BCC (basal cell carcinoma of skin)    under right eye and right ear   Blood transfusion without reported diagnosis    Cataract 09/29/2016   Depression    Diverticulitis 2009   Diverticulosis    Family history of breast cancer    Family  history of colon cancer    Fatty liver    GERD (gastroesophageal reflux disease)    Gout    Hearing loss of both ears 03/26/2016   History of atrial fibrillation    x 2 none since 2020   History of chronic prostatitis    started at age 32   History of kidney stones    Hx of adenomatous colonic polyps 02/16/2022   3 diminutive - no recall   Hyperglycemia    Hyperlipidemia    Hypertension    Incomplete right bundle branch block (RBBB)    Internal hemorrhoids    Kidney lesion 06/07/2015   Right midportion, 1.1x1.1 cm hyperechoic, noted on US  ABD   LAFB (left anterior fascicular block)    Lipoma of axilla 09/2016   Liver lesion, right lobe 06/07/2015   2.5x2.4x2.4 cm hypoechoic lesion posterior aspect, noted on US  ABD   Low back pain 03/26/2016   LVH (left ventricular hypertrophy) 08/06/2017   Moderate, Noted on ECHO   Medicare annual wellness visit, subsequent 03/12/2014   OA (osteoarthritis)    Back, Hands, Neck   Obesity 11/25/2007   Qualifier: Diagnosis of  By: Christi MD, Glendia HERO    Other malaise and fatigue 03/13/2013   Premature ventricular complex    Prostate cancer (HCC) dx 2021   Renal insufficiency    Kidney removed right   Scoliosis    Upper thoracic and lumbar   Sinus bradycardia 08/06/2017   Noted on EKG   Vitamin D  deficiency    resolved    PSH: Past Surgical History:  Procedure Laterality Date   CARDIOVERSION  07/31/2017   CATARACT EXTRACTION, BILATERAL     COLON SURGERY  2009   segmental sigmoid resection   COLONOSCOPY     COLONOSCOPY WITH PROPOFOL      CYSTOSCOPY N/A 07/28/2019   Procedure: CYSTOSCOPY FLEXIBLE;  Surgeon: Sherrilee Belvie CROME, MD;  Location: [facility];  Service: Urology;  Laterality: N/A;   CYSTOSCOPY W/ RETROGRADES Right 06/07/2018   Procedure: CYSTOSCOPY WITH RETROGRADE PYELOGRAM, uphrostogram;  Surgeon: Sherrilee Belvie CROME, MD;  Location: [facility];  Service: Urology;  Laterality: Right;   CYSTOSCOPY WITH URETEROSCOPY  AND STENT PLACEMENT Right 02/28/2018   Procedure: CYSTOSCOPY WITH URETEROSCOPY ONNIE;  Surgeon: Ottelin, Mark, MD;  Location: [facility];  Service: Urology;  Laterality: Right;   CYSTOSCOPY/URETEROSCOPY/HOLMIUM LASER/STENT PLACEMENT Right 11/29/2017   Procedure: CYSTOSCOPY/RETROGRADE/URETEROSCOPY/HOLMIUM LASER/STENT PLACEMENT;  Surgeon: Ottelin, Mark, MD;  Location: [facility];  Service: Urology;  Laterality: Right;   EYE SURGERY Bilateral 01/12/2017   cataract removal   history of blood tranfusion  age 89   HOLMIUM LASER APPLICATION Right 09/24/2017   Procedure: HOLMIUM LASER APPLICATION;  Surgeon: Ottelin, Mark, MD;  Location: [facility];  Service: Urology;  Laterality: Right;   IR BALLOON DILATION URETERAL STRICTURE RIGHT  03/14/2018   IR NEPHRO TUBE REMOV/FL  03/17/2018   IR NEPHROSTOGRAM RIGHT THRU EXISTING ACCESS  03/17/2018   IR NEPHROSTOMY EXCHANGE RIGHT  03/14/2018   IR NEPHROSTOMY EXCHANGE RIGHT  06/21/2018   IR NEPHROSTOMY PLACEMENT RIGHT  03/02/2018   IR NEPHROSTOMY PLACEMENT RIGHT  04/20/2018   IR URETERAL STENT PLACEMENT EXISTING ACCESS RIGHT  03/14/2018   NOSE SURGERY     Submucous resection age 70   NUCLEAR STRESS TEST  06/03/2009   RADIOACTIVE SEED IMPLANT N/A 07/28/2019   Procedure: RADIOACTIVE SEED IMPLANT/BRACHYTHERAPY IMPLANT;  Surgeon: Sherrilee Belvie CROME, MD;  Location: [facility];  Service: Urology;  Laterality: N/A;  90 MINS   ROBOT ASSISTED LAPAROSCOPIC NEPHRECTOMY Right 08/04/2018   Procedure: XI ROBOTIC ASSISTED LAPAROSCOPIC NEPHRECTOMY;  Surgeon: Sherrilee Belvie CROME, MD;  Location: [facility];  Service: Urology;  Laterality: Right;  3 HRS   ROBOT ASSISTED PYELOPLASTY Right 06/07/2018   Procedure: attempted XI ROBOTIC ASSISTED PYELOPLASTY, lysis of adhesions;  Surgeon: Sherrilee Belvie CROME, MD;  Location: [facility];  Service: Urology;  Laterality: Right;  3 HRS   SKIN BIOPSY     SPACE OAR INSTILLATION N/A 07/28/2019    Procedure: SPACE OAR INSTILLATION;  Surgeon: Sherrilee Belvie CROME, MD;  Location: [facility];  Service: Urology;  Laterality: N/A;   URETEROSCOPY WITH HOLMIUM LASER LITHOTRIPSY Right 09/24/2017   Procedure: CYSTOSCOPY, URETEROSCOPY WITH HOLMIUM LASER LITHOTRIPSY, STENT PLACEMENT;  Surgeon: Ottelin, Mark, MD;  Location: [facility];  Service: Urology;  Laterality: Right;    SH: Social History   Tobacco Use   Smoking status: Former    Current packs/day: 0.00    Average packs/day: 1 pack/day for 6.0 years (6.0 ttl pk-yrs)    Types: Cigarettes    Start date: 01/11/1969    Quit date: 01/12/1969    Years since quitting: 55.0   Smokeless tobacco: Never   Tobacco comments:    1965-1971  Vaping Use   Vaping status: Never Used  Substance Use Topics   Alcohol use: Not Currently    Comment: 2 whiskey drinks per day   Drug use: Never    ROS: Constitutional:  Negative for fever, chills, weight loss CV: Negative for chest pain, previous MI, hypertension Respiratory:  Negative for shortness of breath, wheezing, sleep apnea, frequent cough GI:  Negative for nausea, vomiting, bloody stool, GERD  PE: BP (!) 106/57   Pulse (!) 50   Ht 5' 10 (1.778 m)   Wt 203 lb (92.1 kg)   BMI 29.13 kg/m  GENERAL APPEARANCE:  Well appearing, well developed, well nourished, NAD HEENT:  Atraumatic, normocephalic, oropharynx clear NECK:  Supple without lymphadenopathy or thyromegaly ABDOMEN:  Soft, non-tender, no masses EXTREMITIES:  Moves all extremities well, without clubbing, cyanosis, or edema NEUROLOGIC:  Alert and oriented x 3,in wheelchair, CN II-XII grossly intact MENTAL STATUS:  appropriate BACK:  Non-tender to palpation, No CVAT SKIN:  Warm, dry, and intact   Results: U/A: Negative   "

## 2024-01-26 NOTE — Progress Notes (Signed)
 Eligard  SubQ Injection   Due to Prostate Cancer patient is present today for a Eligard  Injection.  Medication: Eligard  6 month Dose: 45 mg  Location: right upper outer buttocks NDC: 37064-538-49 Lot: [id number] Exp: 08/10/2024  Patient tolerated well, no complications were noted  Performed by: JAYSON Mainland CMA   PA approval dates:  07/22/23 - 07/21/24 Auth # [id number]

## 2024-01-27 ENCOUNTER — Ambulatory Visit: Payer: Self-pay | Admitting: Urology

## 2024-01-27 LAB — PSA: Prostate Specific Ag, Serum: <0.1 ng/mL (ref 0.0–4.0)

## 2024-01-31 ENCOUNTER — Other Ambulatory Visit: Payer: Self-pay | Admitting: Family

## 2024-02-01 NOTE — Telephone Encounter (Signed)
 Requesting: lorazepam  1mg   Contract:01/20/23 UDS: 12/08/22 Last Visit: 10/29/23 w/ Harlene GRADE Next Visit: 03/02/24 Last Refill: 12/20/23 #90 and 0RF   Please Advise

## 2024-02-10 ENCOUNTER — Other Ambulatory Visit: Payer: Self-pay | Admitting: Family Medicine

## 2024-02-21 ENCOUNTER — Ambulatory Visit: Admitting: Neurology

## 2024-02-22 ENCOUNTER — Ambulatory Visit: Admitting: Neurology

## 2024-03-02 ENCOUNTER — Ambulatory Visit: Admitting: Family Medicine

## 2024-03-07 ENCOUNTER — Ambulatory Visit: Payer: Medicare Other

## 2024-03-14 ENCOUNTER — Encounter: Admitting: Family Medicine

## 2024-07-27 ENCOUNTER — Ambulatory Visit: Admitting: Urology
